# Patient Record
Sex: Male | Born: 1955 | Race: White | Hispanic: No | Marital: Single | Smoking: Never smoker
Health system: Southern US, Community
[De-identification: ages and names within clinical notes are randomized; demographics above are authoritative.]

## PROBLEM LIST (undated history)

## (undated) DIAGNOSIS — M726 Necrotizing fasciitis: Principal | ICD-10-CM

## (undated) DIAGNOSIS — K759 Inflammatory liver disease, unspecified: Secondary | ICD-10-CM

## (undated) DIAGNOSIS — N189 Chronic kidney disease, unspecified: Secondary | ICD-10-CM

## (undated) DIAGNOSIS — J189 Pneumonia, unspecified organism: Secondary | ICD-10-CM

## (undated) DIAGNOSIS — E871 Hypo-osmolality and hyponatremia: Secondary | ICD-10-CM

## (undated) DIAGNOSIS — I509 Heart failure, unspecified: Secondary | ICD-10-CM

## (undated) DIAGNOSIS — N186 End stage renal disease: Secondary | ICD-10-CM

## (undated) DIAGNOSIS — I10 Essential (primary) hypertension: Secondary | ICD-10-CM

## (undated) DIAGNOSIS — E785 Hyperlipidemia, unspecified: Secondary | ICD-10-CM

## (undated) DIAGNOSIS — E876 Hypokalemia: Secondary | ICD-10-CM

## (undated) DIAGNOSIS — E119 Type 2 diabetes mellitus without complications: Secondary | ICD-10-CM

## (undated) HISTORY — DX: Chronic kidney disease, unspecified: N18.9

## (undated) HISTORY — PX: CHOLECYSTECTOMY: SHX55

---

## 2013-09-01 ENCOUNTER — Inpatient Hospital Stay (HOSPITAL_COMMUNITY)
Admission: EM | Admit: 2013-09-01 | Discharge: 2013-09-13 | DRG: 474 | Disposition: A | Discharge status: Rehabilitation Facility | Payer: Medicaid Other | Attending: Internal Medicine | Admitting: Internal Medicine

## 2013-09-01 ENCOUNTER — Encounter (HOSPITAL_COMMUNITY): Payer: Self-pay | Admitting: Emergency Medicine

## 2013-09-01 ENCOUNTER — Emergency Department (HOSPITAL_COMMUNITY): Payer: Medicaid Other

## 2013-09-01 DIAGNOSIS — E878 Other disorders of electrolyte and fluid balance, not elsewhere classified: Secondary | ICD-10-CM

## 2013-09-01 DIAGNOSIS — E118 Type 2 diabetes mellitus with unspecified complications: Secondary | ICD-10-CM

## 2013-09-01 DIAGNOSIS — E871 Hypo-osmolality and hyponatremia: Secondary | ICD-10-CM

## 2013-09-01 DIAGNOSIS — L02619 Cutaneous abscess of unspecified foot: Secondary | ICD-10-CM | POA: Diagnosis present

## 2013-09-01 DIAGNOSIS — E1049 Type 1 diabetes mellitus with other diabetic neurological complication: Secondary | ICD-10-CM | POA: Diagnosis present

## 2013-09-01 DIAGNOSIS — E1110 Type 2 diabetes mellitus with ketoacidosis without coma: Secondary | ICD-10-CM | POA: Diagnosis present

## 2013-09-01 DIAGNOSIS — IMO0002 Reserved for concepts with insufficient information to code with codable children: Secondary | ICD-10-CM

## 2013-09-01 DIAGNOSIS — E11628 Type 2 diabetes mellitus with other skin complications: Secondary | ICD-10-CM | POA: Diagnosis present

## 2013-09-01 DIAGNOSIS — E1149 Type 2 diabetes mellitus with other diabetic neurological complication: Secondary | ICD-10-CM

## 2013-09-01 DIAGNOSIS — M659 Synovitis and tenosynovitis, unspecified: Secondary | ICD-10-CM | POA: Diagnosis present

## 2013-09-01 DIAGNOSIS — L97509 Non-pressure chronic ulcer of other part of unspecified foot with unspecified severity: Secondary | ICD-10-CM | POA: Diagnosis present

## 2013-09-01 DIAGNOSIS — E1165 Type 2 diabetes mellitus with hyperglycemia: Secondary | ICD-10-CM

## 2013-09-01 DIAGNOSIS — M65979 Unspecified synovitis and tenosynovitis, unspecified ankle and foot: Secondary | ICD-10-CM | POA: Diagnosis present

## 2013-09-01 DIAGNOSIS — L03119 Cellulitis of unspecified part of limb: Secondary | ICD-10-CM

## 2013-09-01 DIAGNOSIS — E876 Hypokalemia: Secondary | ICD-10-CM

## 2013-09-01 DIAGNOSIS — M726 Necrotizing fasciitis: Principal | ICD-10-CM | POA: Diagnosis present

## 2013-09-01 DIAGNOSIS — E119 Type 2 diabetes mellitus without complications: Secondary | ICD-10-CM | POA: Diagnosis present

## 2013-09-01 DIAGNOSIS — L089 Local infection of the skin and subcutaneous tissue, unspecified: Secondary | ICD-10-CM

## 2013-09-01 DIAGNOSIS — E8809 Other disorders of plasma-protein metabolism, not elsewhere classified: Secondary | ICD-10-CM | POA: Diagnosis present

## 2013-09-01 DIAGNOSIS — E1065 Type 1 diabetes mellitus with hyperglycemia: Secondary | ICD-10-CM | POA: Diagnosis present

## 2013-09-01 DIAGNOSIS — E1010 Type 1 diabetes mellitus with ketoacidosis without coma: Secondary | ICD-10-CM | POA: Diagnosis present

## 2013-09-01 HISTORY — DX: Pneumonia, unspecified organism: J18.9

## 2013-09-01 HISTORY — DX: Essential (primary) hypertension: I10

## 2013-09-01 HISTORY — DX: Type 2 diabetes mellitus without complications: E11.9

## 2013-09-01 HISTORY — DX: Hypokalemia: E87.6

## 2013-09-01 HISTORY — DX: Necrotizing fasciitis: M72.6

## 2013-09-01 HISTORY — DX: Inflammatory liver disease, unspecified: K75.9

## 2013-09-01 HISTORY — DX: Hypo-osmolality and hyponatremia: E87.1

## 2013-09-01 LAB — CBC
HCT: 40.9 % (ref 39.0–52.0)
Hemoglobin: 15 g/dL (ref 13.0–17.0)
MCH: 31.4 pg (ref 26.0–34.0)
MCHC: 36.7 g/dL — ABNORMAL HIGH (ref 30.0–36.0)
MCV: 85.6 fL (ref 78.0–100.0)
PLATELETS: 309 10*3/uL (ref 150–400)
RBC: 4.78 MIL/uL (ref 4.22–5.81)
RDW: 13.5 % (ref 11.5–15.5)
WBC: 26.8 10*3/uL — AB (ref 4.0–10.5)

## 2013-09-01 LAB — CG4 I-STAT (LACTIC ACID): LACTIC ACID, VENOUS: 1.43 mmol/L (ref 0.5–2.2)

## 2013-09-01 LAB — BASIC METABOLIC PANEL
BUN: 28 mg/dL — ABNORMAL HIGH (ref 6–23)
CALCIUM: 9.5 mg/dL (ref 8.4–10.5)
CO2: 9 mEq/L — CL (ref 19–32)
CREATININE: 0.89 mg/dL (ref 0.50–1.35)
Chloride: 95 mEq/L — ABNORMAL LOW (ref 96–112)
GFR calc Af Amer: >90 mL/min (ref >90)
GFR calc non Af Amer: >90 mL/min (ref >90)
GLUCOSE: 594 mg/dL — AB (ref 70–99)
Potassium: 3.8 mEq/L (ref 3.7–5.3)
Sodium: 133 mEq/L — ABNORMAL LOW (ref 137–147)

## 2013-09-01 MED ORDER — DEXTROSE 50 % IV SOLN: 25.0000 mL | INTRAVENOUS | Status: DC | PRN

## 2013-09-01 MED ORDER — DEXTROSE-NACL 5-0.45 % IV SOLN: INTRAVENOUS | Status: DC

## 2013-09-01 MED ORDER — VANCOMYCIN HCL IN DEXTROSE 1-5 GM/200ML-% IV SOLN
1000.0000 mg | Freq: Once | INTRAVENOUS | Status: AC
Start: 2013-09-01 — End: 2013-09-02
  Administered 2013-09-01: 1000 mg via INTRAVENOUS
  Filled 2013-09-01: qty 200

## 2013-09-01 MED ORDER — CEFAZOLIN SODIUM 1-5 GM-% IV SOLN
1.0000 g | Freq: Once | INTRAVENOUS | Status: AC
  Administered 2013-09-01: 1 g via INTRAVENOUS
  Filled 2013-09-01: qty 50

## 2013-09-01 MED ORDER — SODIUM CHLORIDE 0.9 % IV SOLN: INTRAVENOUS | Status: DC

## 2013-09-01 MED ORDER — SODIUM CHLORIDE 0.9 % IV BOLUS (SEPSIS): 1000.0000 mL | Freq: Once | INTRAVENOUS | Status: DC

## 2013-09-01 MED ORDER — INSULIN REGULAR BOLUS VIA INFUSION
0.0000 [IU] | Freq: Three times a day (TID) | INTRAVENOUS | Status: DC
  Administered 2013-09-03: 2.5 [IU] via INTRAVENOUS
  Administered 2013-09-04: 4.5 [IU] via INTRAVENOUS
  Administered 2013-09-04: 5 [IU] via INTRAVENOUS
  Administered 2013-09-04: 4.5 [IU] via INTRAVENOUS
  Administered 2013-09-05: 5.1 [IU] via INTRAVENOUS
  Administered 2013-09-05: 5.4 [IU] via INTRAVENOUS
  Administered 2013-09-05: 5.6 [IU] via INTRAVENOUS
  Filled 2013-09-01: qty 10

## 2013-09-01 MED ORDER — INSULIN REGULAR HUMAN 100 UNIT/ML IJ SOLN
INTRAMUSCULAR | Status: DC
  Administered 2013-09-02: 4.6 [IU]/h via INTRAVENOUS
  Administered 2013-09-02: 10.1 [IU]/h via INTRAVENOUS
  Administered 2013-09-02: 7.1 [IU]/h via INTRAVENOUS
  Administered 2013-09-03: 10.5 [IU]/h via INTRAVENOUS
  Administered 2013-09-03: 11.7 [IU]/h via INTRAVENOUS
  Administered 2013-09-03: 13.4 [IU]/h via INTRAVENOUS
  Administered 2013-09-03: 21:00:00 via INTRAVENOUS
  Administered 2013-09-03: 11.7 [IU]/h via INTRAVENOUS
  Administered 2013-09-04: 9.1 [IU]/h via INTRAVENOUS
  Administered 2013-09-04: 6.8 [IU]/h via INTRAVENOUS
  Administered 2013-09-04: 11.1 [IU]/h via INTRAVENOUS
  Administered 2013-09-04: 7.4 [IU]/h via INTRAVENOUS
  Administered 2013-09-04: 10 [IU]/h via INTRAVENOUS
  Administered 2013-09-05: 10.1 [IU]/h via INTRAVENOUS
  Administered 2013-09-05: 15:00:00 via INTRAVENOUS
  Filled 2013-09-01 (×9): qty 1

## 2013-09-01 MED ORDER — PIPERACILLIN-TAZOBACTAM 3.375 G IVPB 30 MIN
3.3750 g | Freq: Once | INTRAVENOUS | Status: AC
  Administered 2013-09-01: 3.375 g via INTRAVENOUS
  Filled 2013-09-01: qty 50

## 2013-09-01 NOTE — Progress Notes (Signed)
ANTIBIOTIC CONSULT NOTE - INITIAL  Pharmacy Consult:  Ancef Indication:  Bilateral feet infection  No Known Allergies  Patient Measurements:    Vital Signs: Temp: 97.7 F (36.5 C) (02/01 1829) Temp src: Oral (02/01 1829) BP: 137/90 mmHg (02/01 1945) Pulse Rate: 105 (02/01 1945)  Labs: No results found for this basename: WBC, HGB, PLT, LABCREA, CREATININE,  in the last 72 hours CrCl is unknown because no creatinine reading has been taken and the patient has no height on file. No results found for this basename: VANCOTROUGH, VANCOPEAK, VANCORANDOM, GENTTROUGH, GENTPEAK, GENTRANDOM, TOBRATROUGH, TOBRAPEAK, TOBRARND, AMIKACINPEAK, AMIKACINTROU, AMIKACIN,  in the last 72 hours   Microbiology: No results found for this or any previous visit (from the past 720 hour(s)).  Medical History: Past Medical History  Diagnosis Date  . Hepatitis        Assessment: 30 YOM with history of frostbite last year presented with complaints of intermittent numbness and paresthesias of bilateral feet.  Pharmacy consulted to manage Ancef.  BMET was pending and now canceled (?).   Goal of Therapy:  Clearance of infection   Plan:  - Ancef 1gm IV x 1 - F/U renal fxn and order maintenance regimen as appropriate - F/U height and weight (RN aware)    Oaklyn Mans D. Mina Marble, PharmD, BCPS Pager:  [phone] 09/01/2013, 9:58 PM

## 2013-09-01 NOTE — ED Notes (Signed)
The pts family has been at the beside .  The sister is in the waiting room and does not wish to stay at the bedside due to the odor.  The pts lt great toes is partially missing from a childhood accident.  Water given to the pt

## 2013-09-01 NOTE — ED Notes (Signed)
Zosyn started

## 2013-09-01 NOTE — Consult Note (Signed)
ORTHOPAEDIC CONSULTATION  REQUESTING PHYSICIAN: Orlie Dakin, MD  Chief Complaint: Left foot infection r/o Delma Post fasc  HPI: [name] is a 58 y.o. male who complains of left foot pain, swelling, drainage since Monday.  Patient lives in his car.  Does not have regular MD care.  Comes in tonight because his sisters demanded that he be seen.  Denies fevers.  Does not take care of himself very well.  Denies DM although patient does not have a regular MD.  Past Medical History  Diagnosis Date  . Hepatitis    History reviewed. No pertinent past surgical history. History   Social History  . Marital Status: Single    Spouse Name: N/A    Number of Children: N/A  . Years of Education: N/A   Social History Main Topics  . Smoking status: Never Smoker   . Smokeless tobacco: None  . Alcohol Use: No  . Drug Use: No  . Sexual Activity: None   Other Topics Concern  . None   Social History Narrative  . None   History reviewed. No pertinent family history. No Known Allergies Prior to Admission medications   Not on File   Dg Foot Complete Left  09/01/2013   CLINICAL DATA:  Skin ulcerations both feet.  EXAM: LEFT FOOT - COMPLETE 3+ VIEW  COMPARISON:  None.  FINDINGS: Extensive gas is seen tracking in dorsal and plantar soft tissues of the left foot. Although diffuse, the largest collection of gas is seen about the third, fourth and fifth metatarsals. Soft tissues of the foot are swollen. No radiopaque foreign body is identified. No bony destructive change is seen. 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 Well corticated defect in the head of the middle phalanx of the second toe is identified and may be postoperative.  IMPRESSION: Extensive soft tissue gas in the right foot is consistent with cellulitis and abscess formation. Necrotizing fasciitis can cause this appearance. No plain film evidence of osteomyelitis is identified.    Electronically Signed   By: Inge Rise M.D.   On: 09/01/2013 21:20   Dg Foot Complete Right  09/01/2013   ADDENDUM REPORT: 09/01/2013 21:57  ADDENDUM: This addendum is given for the purpose of noting that refreence to the right foot in the impression is incorrect. The report is for the left foot.   Electronically Signed   By: Inge Rise M.D.   On: 09/01/2013 21:57   09/01/2013   CLINICAL DATA:  Ulcers on both feet.  EXAM: RIGHT FOOT COMPLETE - 3+ VIEW  COMPARISON:  None.  FINDINGS: No bony destructive change, radiopaque foreign body or soft tissue gas collection is identified. There appear to be skin ulcerations in the lateral margin of the foot. There is no fracture or dislocation. Prominent calcaneal spurs are noted.  IMPRESSION: Skin ulcerations without acute bony or joint abnormality.  Electronically Signed: By: Inge Rise M.D. On: 09/01/2013 21:15    Positive ROS: All other systems have been reviewed and were otherwise negative with the exception of those mentioned in the HPI and as above.  Physical Exam: General: Alert, no acute distress Cardiovascular: No pedal edema Respiratory: No cyanosis, no use of accessory musculature GI: No organomegaly, abdomen is soft and non-tender Skin: celllulitis to level of left ankle Neurologic: Sensation intact distally Psychiatric: Patient is competent for consent with normal mood and affect Lymphatic: No axillary or cervical lymphadenopathy  MUSCULOSKELETAL:  Left foot - cellulitis to level of ankle - nonpalpable pulses - purulent drainage from plantar, lateral foot - crepitus felt in dorsum of foot - foul smelling odor - toes wwp  Right foot - dry skin - palpable pulses - benign exam  Assessment: Left foot infection r/o nec fasc  Plan: - xrays reviewed and gas on xray may be c/w nec fasc although clinical course does not completely fit and other infections can cause gas also - recommend vanc and zosyn emprically -  recommend left foot MRI asap, with contrast ideally - NPO after midnight - patient will need I&D tomorrow but MRI will allow for better surgical planning - if patient begins to decline clinically tonight, please call [phone] for re-eval as patient may need emergent I&D - will follow  Thank you for the consult and the opportunity to see [name] Eduard Roux, MD [facility] [phone] 11:12 PM

## 2013-09-01 NOTE — ED Notes (Signed)
Pt is very vague. He states hes had intermittent numbness and pain in his feet since he got frostbite last year. He also states that last week he lost control of his bladder. Is unable to state specifically why he is here today.

## 2013-09-01 NOTE — ED Provider Notes (Signed)
CSN: [id number]     Arrival date & time 09/01/13  1817 History   First MD Initiated Contact with Patient 09/01/13 1921     Chief Complaint  Patient presents with  . Foot Pain   (Consider location/radiation/quality/duration/timing/severity/associated sxs/prior Treatment) The history is provided by the patient, a relative and medical records.   This is a 58 year old male with past medical history significant for hypertension and hepatitis, presenting to the ED for intermittent numbness and paresthesias of bilateral feet after an episode of frostbite last year.  Patient was brought in by family who provide most of the history. Sister and sister-in-law state that patient has been living in his car for the past 3 years following the death of his mother. They say they both offered him the opportunity to live with them, but he declines.  Additionally they note, he has been in a steady decline of health mentally and physically after his divorce from his wife. Pt denies fevers, sweats, or chills.  States he felt like he might have been getting a "cold" starting Monday but feels ok today.  VS stable on arrival.  Past Medical History  Diagnosis Date  . Hepatitis    History reviewed. No pertinent past surgical history. History reviewed. No pertinent family history. History  Substance Use Topics  . Smoking status: Never Smoker   . Smokeless tobacco: Not on file  . Alcohol Use: No    Review of Systems  Skin: Positive for wound.  All other systems reviewed and are negative.    Allergies  Review of patient's allergies indicates no known allergies.  Home Medications  No current outpatient prescriptions on file. BP 137/90  Pulse 105  Temp(Src) 97.7 F (36.5 C) (Oral)  Resp 18  SpO2 100%  Physical Exam  Nursing note and vitals reviewed. Constitutional: He is oriented to person, place, and time. He appears well-developed and well-nourished. No distress.  Disheveled appearance, clothes soiled  and malodorous  HENT:  Head: Normocephalic and atraumatic.  Mouth/Throat: Oropharynx is clear and moist.  Eyes: Conjunctivae and EOM are normal. Pupils are equal, round, and reactive to light.  Neck: Normal range of motion. Neck supple.  Cardiovascular: Normal rate, regular rhythm and normal heart sounds.   Pulmonary/Chest: Effort normal and breath sounds normal.  Abdominal: Soft. Bowel sounds are normal.  Musculoskeletal: Normal range of motion.  Soles of bilateral feet macerated; multiple ulcers present with active purulent, malodorous drainage; freely moving both feet and all toes; decreased sensation to distal toes Right foot very cold to touch without palpable pulse but detectable by doppler, slow cap refill Left foot warmer with palpable DP pulse  Neurological: He is alert and oriented to person, place, and time.  Skin: Skin is warm and dry. He is not diaphoretic.  Psychiatric: He has a normal mood and affect.           ED Course  Procedures (including critical care time) Labs Review Labs Reviewed  CBC - Abnormal; Notable for the following:    WBC 26.8 (*)    MCHC 36.7 (*)    All other components within normal limits  BASIC METABOLIC PANEL - Abnormal; Notable for the following:    Sodium 133 (*)    Chloride 95 (*)    CO2 9 (*)    Glucose, Bld 594 (*)    BUN 28 (*)    All other components within normal limits  WOUND CULTURE  URINALYSIS, ROUTINE W REFLEX MICROSCOPIC  URINE RAPID DRUG  SCREEN (HOSP PERFORMED)  BLOOD GAS, VENOUS  CG4 I-STAT (LACTIC ACID)   Imaging Review Dg Foot Complete Left  09/01/2013   CLINICAL DATA:  Skin ulcerations both feet.  EXAM: LEFT FOOT - COMPLETE 3+ VIEW  COMPARISON:  None.  FINDINGS: Extensive gas is seen tracking in dorsal and plantar soft tissues of the left foot. Although diffuse, the largest collection of gas is seen about the third, fourth and fifth metatarsals. Soft tissues of the foot are swollen. No radiopaque foreign body is  identified. No bony destructive change is seen. 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 Well corticated defect in the head of the middle phalanx of the second toe is identified and may be postoperative.  IMPRESSION: Extensive soft tissue gas in the right foot is consistent with cellulitis and abscess formation. Necrotizing fasciitis can cause this appearance. No plain film evidence of osteomyelitis is identified.   Electronically Signed   By: Inge Rise M.D.   On: 09/01/2013 21:20   Dg Foot Complete Right  09/01/2013   CLINICAL DATA:  Ulcers on both feet.  EXAM: RIGHT FOOT COMPLETE - 3+ VIEW  COMPARISON:  None.  FINDINGS: No bony destructive change, radiopaque foreign body or soft tissue gas collection is identified. There appear to be skin ulcerations in the lateral margin of the foot. There is no fracture or dislocation. Prominent calcaneal spurs are noted.  IMPRESSION: Skin ulcerations without acute bony or joint abnormality.   Electronically Signed   By: Inge Rise M.D.   On: 09/01/2013 21:15    EKG Interpretation   None       MDM   1. Left foot infection   2. Right foot infection   3. DKA (diabetic ketoacidoses)   4. Hyponatremia   5. Hypochloremia    Pt makes no mention of loss of bladder control during evaluation, however this was mentioned by triage nurse.  Upon entering pts room, there is a strong, foul odor present. Patient socks and shoes were removed, both soiled with  malodorous, purulent drainage. Soles of bilateral feet are macerated with multiple ulcerations and drainage. The right foot is cold compared with left and has no palpable pulse-- pulse confirmed with doppler. Left foot does have a palpable dorsalis pedis pulse.  Wound culture was obtained from largest ulcer of lateral left foot.  Basic labs obtained.  Ancef started.  Plain films obtained-- original report of left foot indicated gas in right foot.   I have spoken with radiologist (Dr. Pollyann Kennedy), he confirms gas was present in the left foot, not the right foot and he has amended his read.  Consulted orthopedics, Dr. Erlinda Hong, who has evaluated pt in the ED-- recommends vanc/zosyn, NPO after midnight, MRI w/contrast of left foot in morning and he will take to OR tomorrow for debridment.  Asked that if pts condition changes overnight or begins to appear toxic that he be contacted.  Pts blood sugar elevated at 594, anion gap at 29-- clinically DKA.  Will obtain venous blood gas and start on glucose stabilizer.  Discussed with hospitalist, Dr. Marvis Repress who will admit.  Larene Pickett, PA-C 09/02/13 [phone]

## 2013-09-01 NOTE — ED Provider Notes (Addendum)
Complains of bilateral numbness in his feet. On exam patient is poorly kempt. Bilateral feet with peeling skin, necrotic skin. Right foot DP pulse 2+ PT pulse 1+ left foot DP pulse obtainable by Doppler PT pulse 1+  Orlie Dakin, MD 09/01/13 2147  Orlie Dakin, MD 09/02/13 [id number]

## 2013-09-01 NOTE — ED Provider Notes (Addendum)
X-rays viewed by me. Doubt necrotizing fasciitis clinically in that has had an indolent course. However we've consult to Dr.Xu who will evaluate patient to determine if surgical debridement is required.   Orlie Dakin, MD 09/01/13 2242 Case discussed with Dr. Erlinda Hong plan admit to medical service. He requests MRI scan to be obtained in the morning. No emergent surgery needed. We'll treat with intravenous antibiotics presently.  Orlie Dakin, MD 09/02/13 [id number]

## 2013-09-01 NOTE — ED Notes (Signed)
Pt. Bilateral feet macerated, skin peeling, and flaking, multiple ulcers, areas of redness and yellow discharge, very malodorus. Pt feet cold to touch, cleaned with soap and water, then cleaned with peroxide and dried and covered in warm blankets, pt has urine and feces in pants. Pt cleaned and placed in gown. Pt alert & oriented, in NAD

## 2013-09-01 NOTE — ED Notes (Signed)
LS, EDPA notified of critical lab results.

## 2013-09-01 NOTE — ED Notes (Signed)
orthopedic specialist at bedside

## 2013-09-02 ENCOUNTER — Encounter (HOSPITAL_COMMUNITY): Payer: Self-pay | Admitting: Internal Medicine

## 2013-09-02 ENCOUNTER — Inpatient Hospital Stay (HOSPITAL_COMMUNITY): Payer: Medicaid Other

## 2013-09-02 DIAGNOSIS — E118 Type 2 diabetes mellitus with unspecified complications: Secondary | ICD-10-CM

## 2013-09-02 DIAGNOSIS — L02619 Cutaneous abscess of unspecified foot: Secondary | ICD-10-CM

## 2013-09-02 DIAGNOSIS — E1110 Type 2 diabetes mellitus with ketoacidosis without coma: Secondary | ICD-10-CM

## 2013-09-02 DIAGNOSIS — E1149 Type 2 diabetes mellitus with other diabetic neurological complication: Secondary | ICD-10-CM

## 2013-09-02 DIAGNOSIS — IMO0002 Reserved for concepts with insufficient information to code with codable children: Secondary | ICD-10-CM

## 2013-09-02 DIAGNOSIS — E11628 Type 2 diabetes mellitus with other skin complications: Secondary | ICD-10-CM | POA: Diagnosis present

## 2013-09-02 DIAGNOSIS — M726 Necrotizing fasciitis: Principal | ICD-10-CM

## 2013-09-02 DIAGNOSIS — E119 Type 2 diabetes mellitus without complications: Secondary | ICD-10-CM | POA: Diagnosis present

## 2013-09-02 DIAGNOSIS — E1165 Type 2 diabetes mellitus with hyperglycemia: Secondary | ICD-10-CM

## 2013-09-02 DIAGNOSIS — L03119 Cellulitis of unspecified part of limb: Secondary | ICD-10-CM

## 2013-09-02 DIAGNOSIS — L089 Local infection of the skin and subcutaneous tissue, unspecified: Secondary | ICD-10-CM

## 2013-09-02 LAB — HEPATITIS PANEL, ACUTE
HCV Ab: NEGATIVE
Hep A IgM: NONREACTIVE
Hep B C IgM: NONREACTIVE
Hepatitis B Surface Ag: NEGATIVE

## 2013-09-02 LAB — BASIC METABOLIC PANEL
BUN: 23 mg/dL (ref 6–23)
BUN: 20 mg/dL (ref 6–23)
BUN: 18 mg/dL (ref 6–23)
BUN: 18 mg/dL (ref 6–23)
CHLORIDE: 106 meq/L (ref 96–112)
CHLORIDE: 105 meq/L (ref 96–112)
CO2: 14 meq/L — AB (ref 19–32)
CO2: 16 meq/L — AB (ref 19–32)
CO2: 15 mEq/L — ABNORMAL LOW (ref 19–32)
CO2: 14 meq/L — AB (ref 19–32)
CREATININE: 0.79 mg/dL (ref 0.50–1.35)
Calcium: 8.8 mg/dL (ref 8.4–10.5)
Calcium: 8.7 mg/dL (ref 8.4–10.5)
Calcium: 8.5 mg/dL (ref 8.4–10.5)
Calcium: 8.5 mg/dL (ref 8.4–10.5)
Chloride: 105 mEq/L (ref 96–112)
Chloride: 106 mEq/L (ref 96–112)
Creatinine, Ser: 0.84 mg/dL (ref 0.50–1.35)
Creatinine, Ser: 0.71 mg/dL (ref 0.50–1.35)
Creatinine, Ser: 0.79 mg/dL (ref 0.50–1.35)
GFR calc Af Amer: >90 mL/min (ref >90)
GFR calc Af Amer: >90 mL/min (ref >90)
GFR calc Af Amer: >90 mL/min (ref >90)
GFR calc non Af Amer: >90 mL/min (ref >90)
GFR calc non Af Amer: >90 mL/min (ref >90)
GFR calc non Af Amer: >90 mL/min (ref >90)
GLUCOSE: 125 mg/dL — AB (ref 70–99)
Glucose, Bld: 176 mg/dL — ABNORMAL HIGH (ref 70–99)
Glucose, Bld: 171 mg/dL — ABNORMAL HIGH (ref 70–99)
Glucose, Bld: 130 mg/dL — ABNORMAL HIGH (ref 70–99)
POTASSIUM: 2.9 meq/L — AB (ref 3.7–5.3)
POTASSIUM: 3.5 meq/L — AB (ref 3.7–5.3)
POTASSIUM: 3.3 meq/L — AB (ref 3.7–5.3)
Potassium: 2.9 mEq/L — CL (ref 3.7–5.3)
SODIUM: 139 meq/L (ref 137–147)
Sodium: 140 mEq/L (ref 137–147)
Sodium: 138 mEq/L (ref 137–147)
Sodium: 139 mEq/L (ref 137–147)

## 2013-09-02 LAB — COMPREHENSIVE METABOLIC PANEL
ALT: 9 U/L (ref 0–53)
AST: 12 U/L (ref 0–37)
Albumin: 2.4 g/dL — ABNORMAL LOW (ref 3.5–5.2)
Alkaline Phosphatase: 148 U/L — ABNORMAL HIGH (ref 39–117)
BUN: 25 mg/dL — ABNORMAL HIGH (ref 6–23)
CHLORIDE: 99 meq/L (ref 96–112)
CO2: 13 mEq/L — ABNORMAL LOW (ref 19–32)
Calcium: 9 mg/dL (ref 8.4–10.5)
Creatinine, Ser: 0.83 mg/dL (ref 0.50–1.35)
GFR calc Af Amer: >90 mL/min (ref >90)
GFR calc non Af Amer: >90 mL/min (ref >90)
Glucose, Bld: 309 mg/dL — ABNORMAL HIGH (ref 70–99)
POTASSIUM: 3 meq/L — AB (ref 3.7–5.3)
SODIUM: 134 meq/L — AB (ref 137–147)
TOTAL PROTEIN: 7.9 g/dL (ref 6.0–8.3)
Total Bilirubin: 0.3 mg/dL (ref 0.3–1.2)

## 2013-09-02 LAB — GLUCOSE, CAPILLARY
GLUCOSE-CAPILLARY: 312 mg/dL — AB (ref 70–99)
GLUCOSE-CAPILLARY: 299 mg/dL — AB (ref 70–99)
GLUCOSE-CAPILLARY: 173 mg/dL — AB (ref 70–99)
GLUCOSE-CAPILLARY: 162 mg/dL — AB (ref 70–99)
GLUCOSE-CAPILLARY: 155 mg/dL — AB (ref 70–99)
GLUCOSE-CAPILLARY: 186 mg/dL — AB (ref 70–99)
GLUCOSE-CAPILLARY: 124 mg/dL — AB (ref 70–99)
GLUCOSE-CAPILLARY: 130 mg/dL — AB (ref 70–99)
Glucose-Capillary: 133 mg/dL — ABNORMAL HIGH (ref 70–99)
Glucose-Capillary: 137 mg/dL — ABNORMAL HIGH (ref 70–99)
Glucose-Capillary: 142 mg/dL — ABNORMAL HIGH (ref 70–99)
Glucose-Capillary: 163 mg/dL — ABNORMAL HIGH (ref 70–99)
Glucose-Capillary: 183 mg/dL — ABNORMAL HIGH (ref 70–99)
Glucose-Capillary: 189 mg/dL — ABNORMAL HIGH (ref 70–99)
Glucose-Capillary: 189 mg/dL — ABNORMAL HIGH (ref 70–99)
Glucose-Capillary: 193 mg/dL — ABNORMAL HIGH (ref 70–99)
Glucose-Capillary: 200 mg/dL — ABNORMAL HIGH (ref 70–99)
Glucose-Capillary: 340 mg/dL — ABNORMAL HIGH (ref 70–99)
Glucose-Capillary: 367 mg/dL — ABNORMAL HIGH (ref 70–99)
Glucose-Capillary: 431 mg/dL — ABNORMAL HIGH (ref 70–99)
Glucose-Capillary: 516 mg/dL — ABNORMAL HIGH (ref 70–99)

## 2013-09-02 LAB — URINALYSIS, ROUTINE W REFLEX MICROSCOPIC
Bilirubin Urine: NEGATIVE
Glucose, UA: >1000 mg/dL — AB
Ketones, ur: >80 mg/dL — AB
LEUKOCYTES UA: NEGATIVE
Nitrite: NEGATIVE
PH: 6 (ref 5.0–8.0)
Protein, ur: 100 mg/dL — AB
Specific Gravity, Urine: 1.035 — ABNORMAL HIGH (ref 1.005–1.030)
UROBILINOGEN UA: 0.2 mg/dL (ref 0.0–1.0)

## 2013-09-02 LAB — HEMOGLOBIN A1C
HEMOGLOBIN A1C: 12.2 % — AB (ref <5.7)
Mean Plasma Glucose: 303 mg/dL — ABNORMAL HIGH (ref <117)

## 2013-09-02 LAB — RAPID URINE DRUG SCREEN, HOSP PERFORMED
Amphetamines: NOT DETECTED
Barbiturates: NOT DETECTED
Benzodiazepines: NOT DETECTED
Cocaine: NOT DETECTED
Opiates: NOT DETECTED
Tetrahydrocannabinol: NOT DETECTED

## 2013-09-02 LAB — TSH: TSH: 1.353 u[IU]/mL (ref 0.350–4.500)

## 2013-09-02 LAB — PHOSPHORUS
PHOSPHORUS: 1.3 mg/dL — AB (ref 2.3–4.6)
PHOSPHORUS: 1.9 mg/dL — AB (ref 2.3–4.6)
PHOSPHORUS: 2.3 mg/dL (ref 2.3–4.6)
Phosphorus: 0.6 mg/dL — CL (ref 2.3–4.6)
Phosphorus: 0.9 mg/dL — CL (ref 2.3–4.6)

## 2013-09-02 LAB — CBC
HEMATOCRIT: 33.4 % — AB (ref 39.0–52.0)
Hemoglobin: 12.3 g/dL — ABNORMAL LOW (ref 13.0–17.0)
MCH: 30.6 pg (ref 26.0–34.0)
MCHC: 36.8 g/dL — ABNORMAL HIGH (ref 30.0–36.0)
MCV: 83.1 fL (ref 78.0–100.0)
Platelets: 256 10*3/uL (ref 150–400)
RBC: 4.02 MIL/uL — ABNORMAL LOW (ref 4.22–5.81)
RDW: 13.3 % (ref 11.5–15.5)
WBC: 23.3 10*3/uL — ABNORMAL HIGH (ref 4.0–10.5)

## 2013-09-02 LAB — URINE MICROSCOPIC-ADD ON

## 2013-09-02 LAB — MAGNESIUM: MAGNESIUM: 2.5 mg/dL (ref 1.5–2.5)

## 2013-09-02 MED ORDER — POTASSIUM PHOSPHATE DIBASIC 3 MMOLE/ML IV SOLN
30.0000 mmol | Freq: Once | INTRAVENOUS | Status: AC
  Administered 2013-09-02: 30 mmol via INTRAVENOUS
  Filled 2013-09-02: qty 10

## 2013-09-02 MED ORDER — POTASSIUM CHLORIDE CRYS ER 20 MEQ PO TBCR
40.0000 meq | EXTENDED_RELEASE_TABLET | Freq: Once | ORAL | Status: AC
  Administered 2013-09-02: 40 meq via ORAL
  Filled 2013-09-02: qty 2

## 2013-09-02 MED ORDER — POTASSIUM PHOSPHATE DIBASIC 3 MMOLE/ML IV SOLN
30.0000 mmol | Freq: Once | INTRAVENOUS | Status: DC
  Filled 2013-09-02: qty 10

## 2013-09-02 MED ORDER — SODIUM CHLORIDE 0.9 % IV SOLN
INTRAVENOUS | Status: DC
  Administered 2013-09-02: 04:00:00 via INTRAVENOUS

## 2013-09-02 MED ORDER — LIVING WELL WITH DIABETES BOOK
Freq: Once | Status: AC
  Administered 2013-09-02: 12:00:00
  Filled 2013-09-02: qty 1

## 2013-09-02 MED ORDER — POTASSIUM CHLORIDE 2 MEQ/ML IV SOLN
INTRAVENOUS | Status: DC
  Administered 2013-09-02 – 2013-09-04 (×4): via INTRAVENOUS
  Filled 2013-09-02 (×14): qty 1000

## 2013-09-02 MED ORDER — PIPERACILLIN-TAZOBACTAM 3.375 G IVPB
3.3750 g | Freq: Three times a day (TID) | INTRAVENOUS | Status: DC
  Administered 2013-09-02 – 2013-09-13 (×33): 3.375 g via INTRAVENOUS
  Filled 2013-09-02 (×38): qty 50

## 2013-09-02 MED ORDER — ONDANSETRON HCL 4 MG PO TABS: 4.0000 mg | ORAL_TABLET | Freq: Four times a day (QID) | ORAL | Status: DC | PRN

## 2013-09-02 MED ORDER — MORPHINE SULFATE 2 MG/ML IJ SOLN
2.0000 mg | INTRAMUSCULAR | Status: DC | PRN
  Administered 2013-09-02 – 2013-09-04 (×7): 2 mg via INTRAVENOUS
  Filled 2013-09-02 (×7): qty 1

## 2013-09-02 MED ORDER — POTASSIUM PHOSPHATE DIBASIC 3 MMOLE/ML IV SOLN
20.0000 mmol | Freq: Once | INTRAVENOUS | Status: AC
  Administered 2013-09-02: 20 mmol via INTRAVENOUS
  Filled 2013-09-02: qty 6.67

## 2013-09-02 MED ORDER — SODIUM CHLORIDE 0.9 % IJ SOLN
3.0000 mL | Freq: Two times a day (BID) | INTRAMUSCULAR | Status: DC
  Administered 2013-09-02 – 2013-09-12 (×2): 3 mL via INTRAVENOUS

## 2013-09-02 MED ORDER — POTASSIUM CHLORIDE 10 MEQ/100ML IV SOLN
10.0000 meq | INTRAVENOUS | Status: DC
  Filled 2013-09-02 (×3): qty 100

## 2013-09-02 MED ORDER — PIPERACILLIN-TAZOBACTAM 3.375 G IVPB 30 MIN: 3.3750 g | Freq: Three times a day (TID) | INTRAVENOUS | Status: DC

## 2013-09-02 MED ORDER — ONDANSETRON HCL 4 MG/2ML IJ SOLN
4.0000 mg | Freq: Four times a day (QID) | INTRAMUSCULAR | Status: DC | PRN
Start: 2013-09-02 — End: 2013-09-13

## 2013-09-02 MED ORDER — VANCOMYCIN HCL IN DEXTROSE 1-5 GM/200ML-% IV SOLN
1000.0000 mg | Freq: Two times a day (BID) | INTRAVENOUS | Status: DC
  Administered 2013-09-02 – 2013-09-13 (×21): 1000 mg via INTRAVENOUS
  Filled 2013-09-02 (×26): qty 200

## 2013-09-02 MED ORDER — CLINDAMYCIN PHOSPHATE 600 MG/50ML IV SOLN
600.0000 mg | Freq: Three times a day (TID) | INTRAVENOUS | Status: DC
  Administered 2013-09-02 – 2013-09-13 (×33): 600 mg via INTRAVENOUS
  Filled 2013-09-02 (×37): qty 50

## 2013-09-02 NOTE — ED Notes (Signed)
Insulin drip has just arrived .  Waiting for the vancomycin to infuse

## 2013-09-02 NOTE — Progress Notes (Signed)
ANTIBIOTIC CONSULT NOTE - INITIAL  Pharmacy Consult for vancomycin Indication: cellulitis  No Known Allergies  Patient Measurements: Height: 5\' 3"  (160 cm) Weight: 144 lb (65.318 kg) IBW/kg (Calculated) : 56.9  Vital Signs: Temp: 98.1 F (36.7 C) (02/01 2357) Temp src: Oral (02/01 1829) BP: 125/71 mmHg (02/01 2357) Pulse Rate: 100 (02/01 2357)  Labs:  Recent Labs  09/01/13 2015 09/01/13 2200  WBC 26.8*  --   HGB 15.0  --   PLT 309  --   CREATININE  --  0.89   Estimated Creatinine Clearance: 75.5 ml/min (by C-G formula based on Cr of 0.89).   Microbiology: No results found for this or any previous visit (from the past 720 hour(s)).  Medical History: Past Medical History  Diagnosis Date  . Hepatitis     Assessment: 58yo male c/o pain, swelling, and drainage of left foot since Monday, ortho saw pt in ED, report Xray may be c/w necrotizing fasciitis though clinical course suggests otherwise, to begin IV ABX empirically.  Goal of Therapy:  Vancomycin trough level 15-20 mcg/ml (will decrease to 10-15 if infxn limited to cellulitis)  Plan:  Rec'd vanc 1g in ED; will continue with vancomycin 750mg  IV Q12H and monitor CBC, Cx, levels prn.  Wynona Neat, PharmD, BCPS  09/02/2013,3:21 AM

## 2013-09-02 NOTE — ED Notes (Signed)
Report called to paula rn on 3w

## 2013-09-02 NOTE — Progress Notes (Signed)
PATIENT DETAILS Name: [name] Age: 58 y.o. Sex: male Date of Birth: [birth year] Admit Date: 09/01/2013 Admitting Physician Toy Baker, MD PCP:No PCP Per Patient  Subjective: Continues to have pain in the left lower extremity. Foul smell throughout the room.  Assessment/Plan: Suspected necrotizing infection of the left foot - Patient presented with a foul-smelling ulceration of the left foot, x-ray on admission showed gas in the soft tissue. Patient had significant leukocytosis. Patient was subsequently admitted, orthopedics was consulted. - Currently on vancomycin, Zosyn and clindamycin-day 1 - Current plans are for MRI left foot to determine extent of debridement needed.  Diabetic ketoacidosis - Currently on an insulin infusion, but steadily improved. We will transition to subcutaneous insulin once anion gap has closed. - New diagnosis of diabetes this admission, check A1c  Hypokalemia - Continue to replace potassium. - Be met every 4 hours for now  Hypophosphatemia - Secondary to poor nutritional status, insulin infusion. - Continue to supplement potassium, watch for refeeding syndrome  Disposition: Remain inpatient  DVT Prophylaxis:  SCD's  Code Status: Full code   Family Communication None at bedside  Procedures:  None  CONSULTS:  orthopedic surgery  Time spent 40 minutes-which includes 50% of the time with face-to-face with patient/ family and coordinating care related to the above assessment and plan.    MEDICATIONS: Scheduled Meds: . clindamycin (CLEOCIN) IV  600 mg Intravenous Q8H  . insulin regular  0-10 Units Intravenous TID WC  . living well with diabetes book   Does not apply Once  . piperacillin-tazobactam (ZOSYN)  IV  3.375 g Intravenous Q8H  . potassium chloride  40 mEq Oral Once  . potassium phosphate IVPB (mmol)  20 mmol Intravenous Once  . sodium chloride  3 mL Intravenous Q12H  . vancomycin  1,000 mg Intravenous  Q12H   Continuous Infusions: . sodium chloride    . sodium chloride 100 mL/hr at 09/02/13 0404  . insulin (NOVOLIN-R) infusion 9.3 Units/hr (09/02/13 1114)   PRN Meds:.dextrose, morphine injection, ondansetron (ZOFRAN) IV, ondansetron  Antibiotics: Anti-infectives   Start     Dose/Rate Route Frequency Ordered Stop   09/02/13 1100  vancomycin (VANCOCIN) IVPB 1000 mg/200 mL premix     1,000 mg 200 mL/hr over 60 Minutes Intravenous Every 12 hours 09/02/13 0938     09/02/13 0800  clindamycin (CLEOCIN) IVPB 600 mg     600 mg 100 mL/hr over 30 Minutes Intravenous 3 times per day 09/02/13 0714     09/02/13 0600  piperacillin-tazobactam (ZOSYN) IVPB 3.375 g  Status:  Discontinued     3.375 g 100 mL/hr over 30 Minutes Intravenous 3 times per day 09/02/13 0351 09/02/13 0352   09/02/13 0600  piperacillin-tazobactam (ZOSYN) IVPB 3.375 g     3.375 g 12.5 mL/hr over 240 Minutes Intravenous Every 8 hours 09/02/13 0353     09/01/13 2315  vancomycin (VANCOCIN) IVPB 1000 mg/200 mL premix     1,000 mg 200 mL/hr over 60 Minutes Intravenous  Once 09/01/13 2311 09/02/13 0053   09/01/13 2315  piperacillin-tazobactam (ZOSYN) IVPB 3.375 g     3.375 g 100 mL/hr over 30 Minutes Intravenous  Once 09/01/13 2311 09/01/13 2351   09/01/13 2100  ceFAZolin (ANCEF) IVPB 1 g/50 mL premix     1 g 100 mL/hr over 30 Minutes Intravenous  Once 09/01/13 2052 09/01/13 2314      PHYSICAL EXAM: Vital signs in last 24 hours: Filed Vitals:  09/01/13 2300 09/01/13 2357 09/02/13 0349 09/02/13 0500  BP: 127/75 125/71 136/74 136/74  Pulse: 103 100 107 107  Temp:  98.1 F (36.7 C) 98.5 F (36.9 C) 98.5 F (36.9 C)  TempSrc:      Resp:  _0 Height:   _1  (1.6 m)   Weight:   67.132 kg (148 lb)   SpO2: 100% 100% 100% 100%    Weight change:  Filed Weights   09/01/13 2100 09/01/13 2209 09/02/13 0349  Weight: 65.318 kg (144 lb) 65.318 kg (144 lb) 67.132 kg (148 lb)   Body mass index is 26.22 kg/(m^2).    Gen Exam: Awake- not in any distress. Neck: Supple, No JVD.   Chest: B/L Clear.   CVS: S1 S2 Regular, no murmurs.  Abdomen: soft, BS +, non tender, non distended.  Extremities: no edema,purulent drainage from plantar, lateral foot, crepitus felt in dorsum of foot, foul smelling odor Neurologic: Non Focal.   Skin: No Rash.   Wounds: N/A.    Intake/Output from previous day:  Intake/Output Summary (Last 24 hours) at 09/02/13 1154 Last data filed at 09/02/13 0700  Gross per 24 hour  Intake  511.6 ml  Output    600 ml  Net  -88.4 ml     LAB RESULTS: CBC  Recent Labs Lab 09/01/13 2015 09/02/13 0625  WBC 26.8* 23.3*  HGB 15.0 12.3*  HCT 40.9 33.4*  PLT 309 256  MCV 85.6 83.1  MCH 31.4 30.6  MCHC 36.7* 36.8*  RDW 13.5 13.3    Chemistries   Recent Labs Lab 09/01/13 2200 09/02/13 0625 09/02/13 1010  NA 133* 134* 140  K 3.8 3.0* 2.9*  CL 95* 99 106  CO2 9* 13* 14*  GLUCOSE 594* 309* 176*  BUN 28* 25* 23  CREATININE 0.89 0.83 0.79  CALCIUM 9.5 9.0 8.8  MG  --  2.5  --     CBG:  Recent Labs Lab 09/02/13 0654 09/02/13 0801 09/02/13 0903 09/02/13 1003 09/02/13 1107  GLUCAP 299* 200* 173* 183* 193*    GFR Estimated Creatinine Clearance: 84 ml/min (by C-G formula based on Cr of 0.79).  Coagulation profile No results found for this basename: INR, PROTIME,  in the last 168 hours  Cardiac Enzymes No results found for this basename: CK, CKMB, TROPONINI, MYOGLOBIN,  in the last 168 hours  No components found with this basename: POCBNP,  No results found for this basename: DDIMER,  in the last 72 hours No results found for this basename: HGBA1C,  in the last 72 hours No results found for this basename: CHOL, HDL, LDLCALC, TRIG, CHOLHDL, LDLDIRECT,  in the last 72 hours No results found for this basename: TSH, T4TOTAL, FREET3, T3FREE, THYROIDAB,  in the last 72 hours No results found for this basename: VITAMINB12, FOLATE, FERRITIN, TIBC, IRON, RETICCTPCT,   in the last 72 hours No results found for this basename: LIPASE, AMYLASE,  in the last 72 hours  Urine Studies No results found for this basename: UACOL, UAPR, USPG, UPH, UTP, UGL, UKET, UBIL, UHGB, UNIT, UROB, ULEU, UEPI, UWBC, URBC, UBAC, CAST, CRYS, UCOM, BILUA,  in the last 72 hours  MICROBIOLOGY: No results found for this or any previous visit (from the past 240 hour(s)).  RADIOLOGY STUDIES/RESULTS: Dg Foot Complete Left  09/01/2013   CLINICAL DATA:  Skin ulcerations both feet.  EXAM: LEFT FOOT - COMPLETE 3+ VIEW  COMPARISON:  None.  FINDINGS: Extensive gas is seen tracking in  dorsal and plantar soft tissues of the left foot. Although diffuse, the largest collection of gas is seen about the third, fourth and fifth metatarsals. Soft tissues of the foot are swollen. No radiopaque foreign body is identified. No bony destructive change is seen. 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 Well corticated defect in the head of the middle phalanx of the second toe is identified and may be postoperative.  IMPRESSION: Extensive soft tissue gas in the right foot is consistent with cellulitis and abscess formation. Necrotizing fasciitis can cause this appearance. No plain film evidence of osteomyelitis is identified.   Electronically Signed   By: Inge Rise M.D.   On: 09/01/2013 21:20   Dg Foot Complete Right  09/01/2013   ADDENDUM REPORT: 09/01/2013 21:57  ADDENDUM: This addendum is given for the purpose of noting that refreence to the right foot in the impression is incorrect. The report is for the left foot.   Electronically Signed   By: Inge Rise M.D.   On: 09/01/2013 21:57   09/01/2013   CLINICAL DATA:  Ulcers on both feet.  EXAM: RIGHT FOOT COMPLETE - 3+ VIEW  COMPARISON:  None.  FINDINGS: No bony destructive change, radiopaque foreign body or soft tissue gas collection is identified. There appear to be skin ulcerations in the  lateral margin of the foot. There is no fracture or dislocation. Prominent calcaneal spurs are noted.  IMPRESSION: Skin ulcerations without acute bony or joint abnormality.  Electronically Signed: By: Inge Rise M.D. On: 09/01/2013 21:15    Oren Binet, MD  [facility] Pager:[phone]  If 7PM-7AM, please contact night-coverage [email or web address] Password [email or web address] 09/02/2013, 11:54 AM   LOS: 1 day

## 2013-09-02 NOTE — ED Notes (Signed)
Insulin drip started at 4.6

## 2013-09-02 NOTE — Progress Notes (Signed)
INITIAL NUTRITION ASSESSMENT  DOCUMENTATION CODES Per approved criteria  -Not Applicable   INTERVENTION:  Diet advancement as able after procedure; recommend CHO-modified diet with Glucerna Shake PO TID, each supplement provides 220 kcal and 10 grams of protein.  RD to provide diet education at a more appropriate time.  NUTRITION DIAGNOSIS: Increased nutrient needs related to infected foot wound as evidenced by estimated nutrition needs.   Goal: Intake to meet >90% of estimated nutrition needs to support wound healing; optimal glycemic control to support healing.  Monitor:  PO intake, labs, weight trend.  Reason for Assessment: MD Consult for diabetes diet education  58 y.o. male  Admitting Dx: suspected necrotizing infection of the left foot  ASSESSMENT: Patient with history of hepatitis; presented to the hospital on 2/1 with foot swelling since last week. Patient became incontinent of urine, feeling lightheaded, blurred vision, patient states that he started to loose balance as well. Patient states that his feet are numb since last year. Patient has been wearing same socks and tenniss shoes for past 3 months at least. X-ray on admission showed gas in the soft tissue.   Plans are for MRI left foot to determine extent of debridement needed. Patient with a new diagnosis of diabetes this admission. Currently on insulin infusion with plans to transition to subcutaneous insulin once anion gap has closed.   Discussed patient with his RN. Patient is not appropriate for education today. He is lethargic and NPO, awaiting possible procedure (debriedement of foot wound). Per RN's discussion with patient's sister, patient lives in his car near sister's house. He eats with the family sometimes, but not always.  Potassium supplementation is ongoing. Concern for refeeding syndrome noted.   Height: Ht Readings from Last 1 Encounters:  09/02/13 5\' 3"  (1.6 m)    Weight: Wt Readings from  Last 1 Encounters:  09/02/13 148 lb (67.132 kg)    Ideal Body Weight: 56.4 kg  % Ideal Body Weight: 119%  Wt Readings from Last 10 Encounters:  09/02/13 148 lb (67.132 kg)    Usual Body Weight: unknown  % Usual Body Weight: N/A  BMI:  Body mass index is 26.22 kg/(m^2).  Estimated Nutritional Needs: Kcal: 2000 Protein: 95-110 gm Fluid: 2 L  Skin: left foot necrotic ulcer  Diet Order: NPO  EDUCATION NEEDS: -Education not appropriate at this time   Intake/Output Summary (Last 24 hours) at 09/02/13 1518 Last data filed at 09/02/13 0700  Gross per 24 hour  Intake  511.6 ml  Output    600 ml  Net  -88.4 ml    Last BM: 1/31   Labs:   Recent Labs Lab 09/01/13 2200 09/02/13 0625 09/02/13 1010 09/02/13 1340  NA 133* 134* 140 139  K 3.8 3.0* 2.9* 2.9*  CL 95* 99 106 105  CO2 9* 13* 14* 16*  BUN 28* 25* 23 20  CREATININE 0.89 0.83 0.79 0.84  CALCIUM 9.5 9.0 8.8 8.7  MG  --  2.5  --   --   PHOS  --  0.6* 0.9* 1.3*  GLUCOSE 594* 309* 176* 171*    CBG (last 3)   Recent Labs  09/02/13 1220 09/02/13 1331 09/02/13 1430  GLUCAP 162* 189* 163*    Scheduled Meds: . clindamycin (CLEOCIN) IV  600 mg Intravenous Q8H  . insulin regular  0-10 Units Intravenous TID WC  . piperacillin-tazobactam (ZOSYN)  IV  3.375 g Intravenous Q8H  . potassium phosphate IVPB (mmol)  20 mmol Intravenous Once  .  potassium phosphate IVPB (mmol)  30 mmol Intravenous Once  . sodium chloride  3 mL Intravenous Q12H  . vancomycin  1,000 mg Intravenous Q12H    Continuous Infusions: . dextrose 5 % and 0.9% NaCl 1,000 mL with potassium chloride 20 mEq infusion 125 mL/hr at 09/02/13 1324  . insulin (NOVOLIN-R) infusion 8.2 Units/hr (09/02/13 1437)    Past Medical History  Diagnosis Date  . Hepatitis     Past Surgical History  Procedure Laterality Date  . Cholecystectomy      Molli Barrows, RD, LDN, [place] Pager [phone] After Hours Pager [phone]

## 2013-09-02 NOTE — ED Notes (Signed)
Admitting doctor here to see 

## 2013-09-02 NOTE — Progress Notes (Signed)
Inpatient Diabetes Program Recommendations  AACE/ADA: New Consensus Statement on Inpatient Glycemic Control (2013)  Target Ranges:  Prepandial:   less than 140 mg/dL      Peak postprandial:   less than 180 mg/dL (1-2 hours)      Critically ill patients:  140 - 180 mg/dL   Results for HIRO, DUFOUR (MRN [id number]) as of 09/02/2013 15:13  Ref. Range 09/01/2013 22:00  Sodium Latest Range: 137-147 mEq/L 133 (L)  Potassium Latest Range: 3.7-5.3 mEq/L 3.8  Chloride Latest Range: 96-112 mEq/L 95 (L)  CO2 Latest Range: 19-32 mEq/L 9 (LL)  BUN Latest Range: 6-23 mg/dL 28 (H)  Creatinine Latest Range: 0.50-1.35 mg/dL 0.89  Calcium Latest Range: 8.4-10.5 mg/dL 9.5  GFR calc non Af Amer Latest Range: >90 mL/min >90  GFR calc Af Amer Latest Range: >90 mL/min >90  Glucose Latest Range: 70-99 mg/dL 594 (HH)    **Admitted with Abscess/cellulitis of foot.  New diagnosis of DM.  Currently in DKA.  Diabetes history: New diagnosis of DM this admission. Outpatient Diabetes medications: None.  New diagnosis. Current orders for Inpatient glycemic control: IV insulin drip per GlucoStabilizer.   **Have ordered educational materials for patient.  DM Coordinator to see patient either today or tomorrow to review basic DM information.  RNs to provide ongoing education with patient and family at bedside.  Have ordered RD consult for this patient for DM diet education.  **Noted BMETs ordered Q4 hours.  Anion gap still elevated at 18 as of BMET results from 1340.  Once anion gap closes and patient ready to transition off IV insulin drip, please give Lantus 1-2 hours before IV insulin drip stopped.  Recommend Lantus 13 units (0.2 units/kg dosing).  Please also start Novolog Sensitive SSI once IV insulin drip stopped.  Will follow. Wyn Quaker RN, MSN, CDE Diabetes Coordinator Inpatient Diabetes Program Team Pager: [phone] (8a-10p)

## 2013-09-02 NOTE — ED Provider Notes (Signed)
Medical screening examination/treatment/procedure(s) were conducted as a shared visit with non-physician practitioner(s) and myself.  I personally evaluated the patient during the encounter.  EKG Interpretation   None        Orlie Dakin, MD 09/02/13 [phone]

## 2013-09-02 NOTE — ED Notes (Signed)
Sister back at th bedside.  Waiting on room assignment

## 2013-09-02 NOTE — H&P (Signed)
PCP:  No PCP Per Patient    Chief Complaint:  Left foot swelling  HPI: [name] is a 58 y.o. male   has a past medical history of Hepatitis.   Presented with  Patient states that he had foot swelling since last week. Patient became incontinent of urine, feeling lightheaded, blurred vision, patient states that he started to loose balance as well. Denies any significant chest pain. Patient states that his feet are numb since last year. Patient has been wearing same socks and tenniss shoes for past 3 months at least. Patient was found to have ulcerations of left foot. Plain film was worrisome for presence of air. Orthopedics have been consulted and requests medical admission for antibiotics coverage, MRI in AM and will take to OR in AM.  Hospitalist called for admission.  Review of Systems:   Pertinent positives include: foot swelling, loss of balance  Constitutional:  No weight loss, night sweats, Fevers, chills, fatigue, weight loss  HEENT:  No headaches, Difficulty swallowing,Tooth/dental problems,Sore throat,  No sneezing, itching, ear ache, nasal congestion, post nasal drip,  Cardio-vascular:  No chest pain, Orthopnea, PND, anasarca, dizziness, palpitations.no Bilateral lower extremity swelling  GI:  No heartburn, indigestion, abdominal pain, nausea, vomiting, diarrhea, change in bowel habits, loss of appetite, melena, blood in stool, hematemesis Resp:  no shortness of breath at rest. No dyspnea on exertion, No excess mucus, no productive cough, No non-productive cough, No coughing up of blood.No change in color of mucus.No wheezing. Skin:  no rash or lesions. No jaundice GU:  no dysuria, change in color of urine, no urgency or frequency. No straining to urinate.  No flank pain.  Musculoskeletal:  No joint pain or no joint swelling. No decreased range of motion. No back pain.  Psych:  No change in mood or affect. No depression or anxiety. No memory loss.  Neuro: no  localizing neurological complaints, no tingling, no weakness, no double vision, no gait abnormality, no slurred speech, no confusion  Otherwise ROS are negative except for above, 10 systems were reviewed  Past Medical History: Past Medical History  Diagnosis Date  . Hepatitis    Past Surgical History  Procedure Laterality Date  . Cholecystectomy       Medications: Prior to Admission medications   Not on File    Allergies:  No Known Allergies  Social History:  Ambulatory   independently   States that he Lives with sister, family he actually resides in a car close to house   reports that he has never smoked. He does not have any smokeless tobacco history on file. He reports that he does not drink alcohol or use illicit drugs.   Family History: family history includes Diabetes type II in his mother.    Physical Exam: Patient Vitals for the past 24 hrs:  BP Temp Temp src Pulse Resp SpO2 Height Weight  09/01/13 2357 125/71 mmHg 98.1 F (36.7 C) - 100 18 100 % - -  09/01/13 2300 127/75 mmHg - - 103 - 100 % - -  09/01/13 2245 141/80 mmHg - - 112 - 98 % - -  09/01/13 2230 138/83 mmHg - - 104 - 100 % - -  09/01/13 2215 132/71 mmHg - - 106 - 100 % - -  09/01/13 2209 - - - - - - 5\' 3"  (1.6 m) 65.318 kg (144 lb)  09/01/13 2200 124/67 mmHg - - 106 - 100 % - -  09/01/13 2100 - - - - - -  5\' 3"  (1.6 m) 65.318 kg (144 lb)  09/01/13 1945 137/90 mmHg - - 105 - 100 % - -  09/01/13 1930 143/84 mmHg - - 108 - 100 % - -  09/01/13 1915 134/82 mmHg - - 107 - 100 % - -  09/01/13 1829 115/67 mmHg 97.7 F (36.5 C) Oral 107 18 100 % - -    1. General:  in No Acute distress 2. Psychological: Alert and  Oriented 3. Head/ENT:   Moist  Mucous Membranes                          Head Non traumatic, neck supple                           Poor Dentition 4. SKIN:  decreased Skin turgor,  Skin Dry right lower extremity with area of problem involving all 5 digits and dorsum of foot. Left low  extremities significant for edema, erythema, crepitus foul discharge.  5. Heart: Regular rate and rhythm no Murmur, Rub or gallop 6. Lungs: Clear to auscultation bilaterally, no wheezes or crackles   7. Abdomen: Soft, non-tender, Non distended 8. Lower extremities: no clubbing, cyanosis, edema  left foot 9. Neurologically Grossly intact, moving all 4 extremities equally 10. MSK: Normal range of motion  body mass index is 25.51 kg/(m^2).   Labs on Admission:   Recent Labs  09/01/13 2200  NA 133*  K 3.8  CL 95*  CO2 9*  GLUCOSE 594*  BUN 28*  CREATININE 0.89  CALCIUM 9.5   No results found for this basename: AST, ALT, ALKPHOS, BILITOT, PROT, ALBUMIN,  in the last 72 hours No results found for this basename: LIPASE, AMYLASE,  in the last 72 hours  Recent Labs  09/01/13 2015  WBC 26.8*  HGB 15.0  HCT 40.9  MCV 85.6  PLT 309   No results found for this basename: CKTOTAL, CKMB, CKMBINDEX, TROPONINI,  in the last 72 hours No results found for this basename: TSH, T4TOTAL, FREET3, T3FREE, THYROIDAB,  in the last 72 hours No results found for this basename: VITAMINB12, FOLATE, FERRITIN, TIBC, IRON, RETICCTPCT,  in the last 72 hours No results found for this basename: HGBA1C    Estimated Creatinine Clearance: 75.5 ml/min (by C-G formula based on Cr of 0.89). ABG No results found for this basename: phart, pco2, po2, hco3, tco2, acidbasedef, o2sat     No results found for this basename: DDIMER     Other results:   Cultures: No results found for this basename: sdes, specrequest, cult, reptstatus       Radiological Exams on Admission: Dg Foot Complete Left  09/01/2013   CLINICAL DATA:  Skin ulcerations both feet.  EXAM: LEFT FOOT - COMPLETE 3+ VIEW  COMPARISON:  None.  FINDINGS: Extensive gas is seen tracking in dorsal and plantar soft tissues of the left foot. Although diffuse, the largest collection of gas is seen about the third, fourth and fifth metatarsals. Soft  tissues of the foot are swollen. No radiopaque foreign body is identified. No bony destructive change is seen. 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 Well corticated defect in the head of the middle phalanx of the second toe is identified and may be postoperative.  IMPRESSION: Extensive soft tissue gas in the right foot is consistent with cellulitis and abscess formation. Necrotizing  fasciitis can cause this appearance. No plain film evidence of osteomyelitis is identified.   Electronically Signed   By: Inge Rise M.D.   On: 09/01/2013 21:20   Dg Foot Complete Right  09/01/2013   ADDENDUM REPORT: 09/01/2013 21:57  ADDENDUM: This addendum is given for the purpose of noting that refreence to the right foot in the impression is incorrect. The report is for the left foot.   Electronically Signed   By: Inge Rise M.D.   On: 09/01/2013 21:57   09/01/2013   CLINICAL DATA:  Ulcers on both feet.  EXAM: RIGHT FOOT COMPLETE - 3+ VIEW  COMPARISON:  None.  FINDINGS: No bony destructive change, radiopaque foreign body or soft tissue gas collection is identified. There appear to be skin ulcerations in the lateral margin of the foot. There is no fracture or dislocation. Prominent calcaneal spurs are noted.  IMPRESSION: Skin ulcerations without acute bony or joint abnormality.  Electronically Signed: By: Inge Rise M.D. On: 09/01/2013 21:15    Chart has been reviewed  Assessment/Plan 58 year old male with history of homelessness he had a severe left foot infection in a setting of uncontrolled diabetes and likely diabetic neuropathy  Present on Admission:  . Cellulitis and abscess of foot - Dr. Erlinda Hong  with orthopedics is aware of the patient. Plan for OR. Time in a.m. For now n.p.o. MRI with contrast in a.m.  . DM (diabetes mellitus) type II controlled, neurological manifestation - will start glucose stabilizer. Obtain hemoglobin A1c. Diabetic  education.  Hx of hepatitis will obtain hepatitis serologies to confirm  Prophylaxis: SCD,   CODE STATUS: FULL CODE  Other plan as per orders.  I have spent a total of 55 min on this admission  Bettyjo Lundblad 09/02/2013, 12:24 AM

## 2013-09-02 NOTE — Progress Notes (Signed)
Utilization review completed.  

## 2013-09-02 NOTE — Progress Notes (Signed)
   Subjective:  Patient reports pain as moderate.  No clinical worsening overnight.  Objective:   VITALS:   Filed Vitals:   09/01/13 2300 09/01/13 2357 09/02/13 0349 09/02/13 0500  BP: 127/75 125/71 136/74 136/74  Pulse: 103 100 107 107  Temp:  98.1 F (36.7 C) 98.5 F (36.9 C) 98.5 F (36.9 C)  TempSrc:      Resp:  18 20 20   Height:   5\' 3"  (1.6 m)   Weight:   67.132 kg (148 lb)   SpO2: 100% 100% 100% 100%    Exam is unchanged from initial consult   Lab Results  Component Value Date   WBC 26.8* 09/01/2013   HGB 15.0 09/01/2013   HCT 40.9 09/01/2013   MCV 85.6 09/01/2013   PLT 309 09/01/2013     Assessment/Plan:     Problem List Items Addressed This Visit     Other   Abscess of foot    Other Visit Diagnoses   Left foot infection    -  Primary    Relevant Medications       ceFAZolin (ANCEF) IVPB 1 g/50 mL premix (Completed)       vancomycin (VANCOCIN) IVPB 1000 mg/200 mL premix (Completed)       clindamycin (CLEOCIN) IVPB 600 mg    Right foot infection        DKA (diabetic ketoacidoses)        Relevant Medications       insulin regular bolus via infusion 0-10 Units    Hyponatremia        Hypochloremia        Diabetes mellitus type 2, uncontrolled, with complications          - NPO - await MRI results - continue IV abx - will follow  Marianna Payment 09/02/2013, 8:14 AM [phone]

## 2013-09-03 ENCOUNTER — Inpatient Hospital Stay (HOSPITAL_COMMUNITY): Payer: Medicaid Other | Admitting: Certified Registered Nurse Anesthetist

## 2013-09-03 ENCOUNTER — Encounter (HOSPITAL_COMMUNITY)
Admission: EM | Disposition: A | Discharge status: Rehabilitation Facility | Payer: Self-pay | Source: Home / Self Care | Attending: Internal Medicine

## 2013-09-03 ENCOUNTER — Encounter (HOSPITAL_COMMUNITY): Payer: Medicaid Other | Admitting: Certified Registered Nurse Anesthetist

## 2013-09-03 DIAGNOSIS — E876 Hypokalemia: Secondary | ICD-10-CM

## 2013-09-03 HISTORY — PX: I & D EXTREMITY: SHX5045

## 2013-09-03 LAB — GLUCOSE, CAPILLARY
GLUCOSE-CAPILLARY: 206 mg/dL — AB (ref 70–99)
GLUCOSE-CAPILLARY: 154 mg/dL — AB (ref 70–99)
GLUCOSE-CAPILLARY: 151 mg/dL — AB (ref 70–99)
GLUCOSE-CAPILLARY: 125 mg/dL — AB (ref 70–99)
GLUCOSE-CAPILLARY: 175 mg/dL — AB (ref 70–99)
GLUCOSE-CAPILLARY: 165 mg/dL — AB (ref 70–99)
GLUCOSE-CAPILLARY: 210 mg/dL — AB (ref 70–99)
GLUCOSE-CAPILLARY: 264 mg/dL — AB (ref 70–99)
GLUCOSE-CAPILLARY: 170 mg/dL — AB (ref 70–99)
Glucose-Capillary: 136 mg/dL — ABNORMAL HIGH (ref 70–99)
Glucose-Capillary: 149 mg/dL — ABNORMAL HIGH (ref 70–99)
Glucose-Capillary: 151 mg/dL — ABNORMAL HIGH (ref 70–99)
Glucose-Capillary: 165 mg/dL — ABNORMAL HIGH (ref 70–99)
Glucose-Capillary: 169 mg/dL — ABNORMAL HIGH (ref 70–99)
Glucose-Capillary: 171 mg/dL — ABNORMAL HIGH (ref 70–99)
Glucose-Capillary: 177 mg/dL — ABNORMAL HIGH (ref 70–99)
Glucose-Capillary: 182 mg/dL — ABNORMAL HIGH (ref 70–99)
Glucose-Capillary: 211 mg/dL — ABNORMAL HIGH (ref 70–99)
Glucose-Capillary: 238 mg/dL — ABNORMAL HIGH (ref 70–99)
Glucose-Capillary: 262 mg/dL — ABNORMAL HIGH (ref 70–99)
Glucose-Capillary: 284 mg/dL — ABNORMAL HIGH (ref 70–99)

## 2013-09-03 LAB — BASIC METABOLIC PANEL
BUN: 18 mg/dL (ref 6–23)
BUN: 16 mg/dL (ref 6–23)
BUN: 16 mg/dL (ref 6–23)
BUN: 16 mg/dL (ref 6–23)
BUN: 14 mg/dL (ref 6–23)
CALCIUM: 7.9 mg/dL — AB (ref 8.4–10.5)
CHLORIDE: 107 meq/L (ref 96–112)
CHLORIDE: 109 meq/L (ref 96–112)
CHLORIDE: 107 meq/L (ref 96–112)
CO2: 15 meq/L — AB (ref 19–32)
CO2: 16 mEq/L — ABNORMAL LOW (ref 19–32)
CO2: 16 meq/L — AB (ref 19–32)
CO2: 15 meq/L — AB (ref 19–32)
CO2: 16 meq/L — AB (ref 19–32)
Calcium: 8.4 mg/dL (ref 8.4–10.5)
Calcium: 8.4 mg/dL (ref 8.4–10.5)
Calcium: 7.8 mg/dL — ABNORMAL LOW (ref 8.4–10.5)
Calcium: 7.8 mg/dL — ABNORMAL LOW (ref 8.4–10.5)
Chloride: 105 mEq/L (ref 96–112)
Chloride: 108 mEq/L (ref 96–112)
Creatinine, Ser: 0.91 mg/dL (ref 0.50–1.35)
Creatinine, Ser: 0.92 mg/dL (ref 0.50–1.35)
Creatinine, Ser: 0.91 mg/dL (ref 0.50–1.35)
Creatinine, Ser: 0.92 mg/dL (ref 0.50–1.35)
Creatinine, Ser: 0.77 mg/dL (ref 0.50–1.35)
GFR calc Af Amer: >90 mL/min (ref >90)
GFR calc Af Amer: >90 mL/min (ref >90)
GFR calc Af Amer: >90 mL/min (ref >90)
GFR calc Af Amer: >90 mL/min (ref >90)
GFR calc non Af Amer: >90 mL/min (ref >90)
GFR calc non Af Amer: >90 mL/min (ref >90)
GFR calc non Af Amer: >90 mL/min (ref >90)
GFR calc non Af Amer: >90 mL/min (ref >90)
GLUCOSE: 210 mg/dL — AB (ref 70–99)
Glucose, Bld: 156 mg/dL — ABNORMAL HIGH (ref 70–99)
Glucose, Bld: 135 mg/dL — ABNORMAL HIGH (ref 70–99)
Glucose, Bld: 174 mg/dL — ABNORMAL HIGH (ref 70–99)
Glucose, Bld: 186 mg/dL — ABNORMAL HIGH (ref 70–99)
POTASSIUM: 3.1 meq/L — AB (ref 3.7–5.3)
POTASSIUM: 3.3 meq/L — AB (ref 3.7–5.3)
POTASSIUM: 3.2 meq/L — AB (ref 3.7–5.3)
Potassium: 3.4 mEq/L — ABNORMAL LOW (ref 3.7–5.3)
Potassium: 3.7 mEq/L (ref 3.7–5.3)
SODIUM: 138 meq/L (ref 137–147)
SODIUM: 139 meq/L (ref 137–147)
Sodium: 140 mEq/L (ref 137–147)
Sodium: 138 mEq/L (ref 137–147)
Sodium: 137 mEq/L (ref 137–147)

## 2013-09-03 LAB — CBC
HCT: 32.6 % — ABNORMAL LOW (ref 39.0–52.0)
HEMOGLOBIN: 12.3 g/dL — AB (ref 13.0–17.0)
MCH: 30.6 pg (ref 26.0–34.0)
MCHC: 37.7 g/dL — ABNORMAL HIGH (ref 30.0–36.0)
MCV: 81.1 fL (ref 78.0–100.0)
Platelets: 245 10*3/uL (ref 150–400)
RBC: 4.02 MIL/uL — AB (ref 4.22–5.81)
RDW: 13.1 % (ref 11.5–15.5)
WBC: 22.3 10*3/uL — ABNORMAL HIGH (ref 4.0–10.5)

## 2013-09-03 SURGERY — IRRIGATION AND DEBRIDEMENT EXTREMITY
Anesthesia: General | Laterality: Left

## 2013-09-03 MED ORDER — HYDROMORPHONE HCL PF 1 MG/ML IJ SOLN
INTRAMUSCULAR | Status: AC
  Filled 2013-09-03: qty 1

## 2013-09-03 MED ORDER — MIDAZOLAM HCL 5 MG/5ML IJ SOLN
INTRAMUSCULAR | Status: DC | PRN
  Administered 2013-09-03: 1 mg via INTRAVENOUS

## 2013-09-03 MED ORDER — POTASSIUM CHLORIDE 10 MEQ/100ML IV SOLN
10.0000 meq | INTRAVENOUS | Status: AC
  Filled 2013-09-03 (×2): qty 100

## 2013-09-03 MED ORDER — LACTATED RINGERS IV SOLN
INTRAVENOUS | Status: DC | PRN
  Administered 2013-09-03: 11:00:00 via INTRAVENOUS

## 2013-09-03 MED ORDER — PROPOFOL 10 MG/ML IV BOLUS
INTRAVENOUS | Status: DC | PRN
  Administered 2013-09-03: 150 mg via INTRAVENOUS

## 2013-09-03 MED ORDER — FENTANYL CITRATE 0.05 MG/ML IJ SOLN
INTRAMUSCULAR | Status: DC | PRN
  Administered 2013-09-03: 100 ug via INTRAVENOUS
  Administered 2013-09-03 (×2): 25 ug via INTRAVENOUS

## 2013-09-03 MED ORDER — ACETAMINOPHEN 325 MG PO TABS
650.0000 mg | ORAL_TABLET | ORAL | Status: DC | PRN
  Administered 2013-09-03 – 2013-09-12 (×3): 650 mg via ORAL
  Filled 2013-09-03 (×3): qty 2

## 2013-09-03 MED ORDER — PHENYLEPHRINE 40 MCG/ML (10ML) SYRINGE FOR IV PUSH (FOR BLOOD PRESSURE SUPPORT)
PREFILLED_SYRINGE | INTRAVENOUS | Status: AC
  Filled 2013-09-03: qty 10

## 2013-09-03 MED ORDER — FENTANYL CITRATE 0.05 MG/ML IJ SOLN
INTRAMUSCULAR | Status: AC
  Filled 2013-09-03: qty 5

## 2013-09-03 MED ORDER — OXYCODONE HCL 5 MG PO TABS
5.0000 mg | ORAL_TABLET | ORAL | Status: DC | PRN
  Administered 2013-09-11 – 2013-09-12 (×3): 10 mg via ORAL
  Filled 2013-09-03 (×3): qty 2

## 2013-09-03 MED ORDER — MIDAZOLAM HCL 2 MG/2ML IJ SOLN
INTRAMUSCULAR | Status: AC
  Filled 2013-09-03: qty 2

## 2013-09-03 MED ORDER — HYDROMORPHONE HCL PF 1 MG/ML IJ SOLN
0.2500 mg | INTRAMUSCULAR | Status: DC | PRN
  Administered 2013-09-03: 0.5 mg via INTRAVENOUS

## 2013-09-03 MED ORDER — PHENYLEPHRINE HCL 10 MG/ML IJ SOLN
INTRAMUSCULAR | Status: DC | PRN
  Administered 2013-09-03 (×2): 40 ug via INTRAVENOUS

## 2013-09-03 MED ORDER — HYDROCODONE-ACETAMINOPHEN 5-325 MG PO TABS: 1.0000 | ORAL_TABLET | ORAL | Status: DC | PRN

## 2013-09-03 MED ORDER — DIPHENHYDRAMINE HCL 12.5 MG/5ML PO ELIX
25.0000 mg | ORAL_SOLUTION | ORAL | Status: DC | PRN
  Filled 2013-09-03: qty 10

## 2013-09-03 MED ORDER — ONDANSETRON HCL 4 MG/2ML IJ SOLN
INTRAMUSCULAR | Status: DC | PRN
Start: 2013-09-03 — End: 2013-09-03
  Administered 2013-09-03: 4 mg via INTRAVENOUS

## 2013-09-03 MED ORDER — LIDOCAINE HCL (CARDIAC) 20 MG/ML IV SOLN
INTRAVENOUS | Status: AC
Start: 2013-09-03 — End: 2013-09-03
  Filled 2013-09-03: qty 5

## 2013-09-03 MED ORDER — OXYCODONE HCL 5 MG PO TABS: 5.0000 mg | ORAL_TABLET | Freq: Once | ORAL | Status: DC | PRN

## 2013-09-03 MED ORDER — ONDANSETRON HCL 4 MG/2ML IJ SOLN
INTRAMUSCULAR | Status: AC
  Filled 2013-09-03: qty 2

## 2013-09-03 MED ORDER — LIDOCAINE HCL (CARDIAC) 20 MG/ML IV SOLN
INTRAVENOUS | Status: DC | PRN
  Administered 2013-09-03: 80 mg via INTRAVENOUS

## 2013-09-03 MED ORDER — GADOBENATE DIMEGLUMINE 529 MG/ML IV SOLN
15.0000 mL | Freq: Once | INTRAVENOUS | Status: AC | PRN
  Administered 2013-09-03: 15 mL via INTRAVENOUS

## 2013-09-03 MED ORDER — POTASSIUM CHLORIDE 10 MEQ/100ML IV SOLN: 10.0000 meq | INTRAVENOUS | Status: DC

## 2013-09-03 MED ORDER — LACTATED RINGERS IV SOLN
INTRAVENOUS | Status: DC
  Administered 2013-09-03: 11:00:00 via INTRAVENOUS

## 2013-09-03 MED ORDER — PROMETHAZINE HCL 25 MG/ML IJ SOLN: 6.2500 mg | INTRAMUSCULAR | Status: DC | PRN

## 2013-09-03 MED ORDER — MORPHINE SULFATE 2 MG/ML IJ SOLN: 1.0000 mg | INTRAMUSCULAR | Status: DC | PRN

## 2013-09-03 MED ORDER — PROPOFOL 10 MG/ML IV BOLUS
INTRAVENOUS | Status: AC
  Filled 2013-09-03: qty 20

## 2013-09-03 MED ORDER — OXYCODONE HCL 5 MG/5ML PO SOLN: 5.0000 mg | Freq: Once | ORAL | Status: DC | PRN

## 2013-09-03 MED ORDER — POTASSIUM CHLORIDE 10 MEQ/100ML IV SOLN
10.0000 meq | INTRAVENOUS | Status: AC
  Administered 2013-09-03 (×3): 10 meq via INTRAVENOUS
  Filled 2013-09-03 (×4): qty 100

## 2013-09-03 MED ORDER — SODIUM CHLORIDE 0.9 % IR SOLN
Status: DC | PRN
  Administered 2013-09-03: 9000 mL

## 2013-09-03 SURGICAL SUPPLY — 65 items
BANDAGE CONFORM 3  STR LF (GAUZE/BANDAGES/DRESSINGS) IMPLANT
BANDAGE ELASTIC 3 VELCRO ST LF (GAUZE/BANDAGES/DRESSINGS) IMPLANT
BLADE SURG 10 STRL SS (BLADE) ×6 IMPLANT
BNDG COHESIVE 1X5 TAN STRL LF (GAUZE/BANDAGES/DRESSINGS) IMPLANT
BNDG COHESIVE 4X5 TAN STRL (GAUZE/BANDAGES/DRESSINGS) ×3 IMPLANT
BNDG COHESIVE 6X5 TAN STRL LF (GAUZE/BANDAGES/DRESSINGS) ×6 IMPLANT
BNDG GAUZE STRTCH 6 (GAUZE/BANDAGES/DRESSINGS) ×9 IMPLANT
CANISTER WOUND CARE 500ML ATS (WOUND CARE) ×3 IMPLANT
CLOTH BEACON ORANGE TIMEOUT ST (SAFETY) ×3 IMPLANT
CORDS BIPOLAR (ELECTRODE) IMPLANT
COVER SURGICAL LIGHT HANDLE (MISCELLANEOUS) ×3 IMPLANT
CUFF TOURNIQUET SINGLE 18IN (TOURNIQUET CUFF) ×3 IMPLANT
CUFF TOURNIQUET SINGLE 24IN (TOURNIQUET CUFF) IMPLANT
CUFF TOURNIQUET SINGLE 34IN LL (TOURNIQUET CUFF) IMPLANT
CUFF TOURNIQUET SINGLE 44IN (TOURNIQUET CUFF) IMPLANT
DRAPE INCISE IOBAN 66X45 STRL (DRAPES) ×3 IMPLANT
DRAPE ORTHO SPLIT 77X108 STRL (DRAPES) ×4
DRAPE SURG 17X23 STRL (DRAPES) IMPLANT
DRAPE SURG ORHT 6 SPLT 77X108 (DRAPES) ×2 IMPLANT
DRAPE U-SHAPE 47X51 STRL (DRAPES) ×3 IMPLANT
DRSG VAC ATS MED SENSATRAC (GAUZE/BANDAGES/DRESSINGS) ×3 IMPLANT
DURAPREP 26ML APPLICATOR (WOUND CARE) ×3 IMPLANT
ELECT CAUTERY BLADE 6.4 (BLADE) ×3 IMPLANT
ELECT REM PT RETURN 9FT ADLT (ELECTROSURGICAL)
ELECTRODE REM PT RTRN 9FT ADLT (ELECTROSURGICAL) IMPLANT
EVACUATOR 1/8 PVC DRAIN (DRAIN) ×3 IMPLANT
FACESHIELD LNG OPTICON STERILE (SAFETY) IMPLANT
GAUZE XEROFORM 1X8 LF (GAUZE/BANDAGES/DRESSINGS) ×3 IMPLANT
GLOVE BIOGEL PI IND STRL 7.0 (GLOVE) ×4 IMPLANT
GLOVE BIOGEL PI INDICATOR 7.0 (GLOVE) ×8
GLOVE SURG SS PI 7.0 STRL IVOR (GLOVE) ×3 IMPLANT
GLOVE SURG SS PI 7.5 STRL IVOR (GLOVE) ×6 IMPLANT
GOWN STRL NON-REIN LRG LVL3 (GOWN DISPOSABLE) ×3 IMPLANT
GOWN STRL REIN XL XLG (GOWN DISPOSABLE) ×3 IMPLANT
HANDPIECE INTERPULSE COAX TIP (DISPOSABLE)
IV SOD CHL 0.9% 1000ML (IV SOLUTION) ×27 IMPLANT
KIT BASIN OR (CUSTOM PROCEDURE TRAY) ×3 IMPLANT
KIT ROOM TURNOVER OR (KITS) ×3 IMPLANT
MANIFOLD NEPTUNE II (INSTRUMENTS) ×3 IMPLANT
NS IRRIG 1000ML POUR BTL (IV SOLUTION) ×3 IMPLANT
PACK ORTHO EXTREMITY (CUSTOM PROCEDURE TRAY) ×3 IMPLANT
PAD ARMBOARD 7.5X6 YLW CONV (MISCELLANEOUS) ×6 IMPLANT
PADDING CAST ABS 4INX4YD NS (CAST SUPPLIES) ×4
PADDING CAST ABS COTTON 4X4 ST (CAST SUPPLIES) ×2 IMPLANT
PADDING CAST COTTON 6X4 STRL (CAST SUPPLIES) ×3 IMPLANT
SET HNDPC FAN SPRY TIP SCT (DISPOSABLE) IMPLANT
SPONGE GAUZE 4X4 12PLY (GAUZE/BANDAGES/DRESSINGS) ×3 IMPLANT
SPONGE LAP 18X18 X RAY DECT (DISPOSABLE) ×3 IMPLANT
STOCKINETTE IMPERVIOUS 9X36 MD (GAUZE/BANDAGES/DRESSINGS) ×3 IMPLANT
SUT ETHILON 2 0 FS 18 (SUTURE) ×9 IMPLANT
SUT ETHILON 3 0 PS 1 (SUTURE) ×6 IMPLANT
SUT VIC AB 2-0 FS1 27 (SUTURE) ×3 IMPLANT
SWAB CULTURE LIQUID MINI MALE (MISCELLANEOUS) ×3 IMPLANT
SYR CONTROL 10ML LL (SYRINGE) IMPLANT
TOWEL OR 17X24 6PK STRL BLUE (TOWEL DISPOSABLE) ×3 IMPLANT
TOWEL OR 17X26 10 PK STRL BLUE (TOWEL DISPOSABLE) ×3 IMPLANT
TOWEL OR NON WOVEN STRL DISP B (DISPOSABLE) ×3 IMPLANT
TUBE ANAEROBIC SPECIMEN COL (MISCELLANEOUS) ×3 IMPLANT
TUBE CONNECTING 12'X1/4 (SUCTIONS) ×1
TUBE CONNECTING 12X1/4 (SUCTIONS) ×2 IMPLANT
TUBE FEEDING 5FR 15 INCH (TUBING) IMPLANT
TUBING CYSTO DISP (UROLOGICAL SUPPLIES) ×3 IMPLANT
UNDERPAD 30X30 INCONTINENT (UNDERPADS AND DIAPERS) ×3 IMPLANT
WATER STERILE IRR 1000ML POUR (IV SOLUTION) ×3 IMPLANT
YANKAUER SUCT BULB TIP NO VENT (SUCTIONS) ×3 IMPLANT

## 2013-09-03 NOTE — Anesthesia Preprocedure Evaluation (Addendum)
Anesthesia Evaluation  Patient identified by MRN, date of birth, ID band Patient awake    Reviewed: Allergy & Precautions, H&P , NPO status , Patient's Chart, lab work & pertinent test results  History of Anesthesia Complications Negative for: history of anesthetic complications  Airway Mallampati: I TM Distance: >3 FB Neck ROM: Full    Dental  (+) Edentulous Upper, Edentulous Lower and Dental Advisory Given   Pulmonary neg pulmonary ROS,    Pulmonary exam normal       Cardiovascular negative cardio ROS      Neuro/Psych negative neurological ROS     GI/Hepatic negative GI ROS, (+) Hepatitis -  Endo/Other  diabetes  Renal/GU      Musculoskeletal   Abdominal   Peds  Hematology   Anesthesia Other Findings   Reproductive/Obstetrics                          Anesthesia Physical Anesthesia Plan  ASA: III  Anesthesia Plan: General   Post-op Pain Management:    Induction: Intravenous  Airway Management Planned: LMA  Additional Equipment:   Intra-op Plan:   Post-operative Plan: Extubation in OR  Informed Consent: I have reviewed the patients History and Physical, chart, labs and discussed the procedure including the risks, benefits and alternatives for the proposed anesthesia with the patient or authorized representative who has indicated his/her understanding and acceptance.   Dental advisory given  Plan Discussed with: CRNA, Anesthesiologist and Surgeon  Anesthesia Plan Comments:         Anesthesia Quick Evaluation

## 2013-09-03 NOTE — Anesthesia Postprocedure Evaluation (Signed)
  Anesthesia Post-op Note  Patient: [name]  Procedure(s) Performed: Procedure(s): IRRIGATION AND DEBRIDEMENT EXTREMITY (Left)  Patient Location: PACU  Anesthesia Type:General  Level of Consciousness: awake  Airway and Oxygen Therapy: Patient Spontanous Breathing  Post-op Pain: mild  Post-op Assessment: Post-op Vital signs reviewed  Post-op Vital Signs: stable  Complications: No apparent anesthesia complications

## 2013-09-03 NOTE — Care Management Note (Unsigned)
    Page 1 of 1   09/09/2013     4:23:07 PM   CARE MANAGEMENT NOTE 09/09/2013  Patient:  [name], [name]   Account Number:  [id number]  Date Initiated:  09/03/2013  Documentation initiated by:  GRAVES-BIGELOW,Deakin Lacek  Subjective/Objective Assessment:   Pt admitted for uncontrolled Diabetes and abscess of foot.     Action/Plan:   CM will continue to monitor for disposition needs. CM did call to the [facility] for pt f/u appointment and orange card eligibility. Awaiting call back.   Anticipated DC Date:  09/05/2013   Anticipated DC Plan:  [place]  CM consult  [facility]      Choice offered to / List presented to:             Status of service:  In process, will continue to follow Medicare Important Message given?   (If response is "NO", the following Medicare IM given date fields will be blank) Date Medicare IM given:   Date Additional Medicare IM given:    Discharge Disposition:    Per UR Regulation:  Reviewed for med. necessity/level of care/duration of stay  If discussed at [place] of Stay Meetings, dates discussed:    Comments:  09-09-13 [place] [id number] Plan for BK on Wednesday. CM will continue to monitor for disposition needs.  09-05-13 [address] [place] [id number] Pt admitted for uncontrolled Diabetes and abscess of foot. Still on iv insulin gtt. Plan for CIR vs SNF when medically stable. CM will continue to monitor for additional d/c needs.  09-04-13  [place], [place] [phone] CM did make a pt a f/u appointment at the [facility] and [facility]. Will continue to monitor.

## 2013-09-03 NOTE — Op Note (Signed)
Date of surgery: 09/03/2048  Preoperative diagnosis: Left foot infection and abscess concerning for necrotizing fasciitis  Postoperative diagnosis: Same  Procedure: 1. Debridement of bone, muscle, skin, soft tissue. CPT 11042 2. Fifth ray amputation. 3. 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 4. Application of negative pressure wound therapy greater than 50 cm.  Surgeon: [name], M.D.  Anesthesia: Gen.  Estimated blood loss: 200 cc  Specimens: None  Cultures: Two  Indications for procedure: [name] is a 58 year old gentleman who presented to the was [place] on 09/01/2013 with an obvious left foot infection. Workup revealed a severe infection left foot with abscess and fasciitis and tenosynovitis. The risks, benefits, and alternatives to surgery were discussed with the patient and he elected to proceed with the above mentioned procedures.  Description of procedure: The patient was identified in the preoperative holding area. The operative site and procedures were confirmed by the patient and marked by the surgeon. He was brought back to the operating room. His placed supine on the table. General anesthesia was induced by the anesthesiologist. The left lower extremity was prepped and draped in standard sterile fashion. A timeout was performed. His empiric antibiotics were given at regular intervals. We began the procedure by excising full-thickness flaps from the lateral and plantar and dorsal aspect of the foot. There was a massive amount of purulence and foul smelling drainage. There was an extensive amount of infection deep in the foot which involved the muscle, tendon, and bone. The fifth ray was amputated back to the level of the Lisfranc joint. The fourth toe was amputated through the MTP joint. All of the flexor and extensor tendon compartments were tracked proximally and decompressed. There was a large amount of plus was expelled from these compartments.  Sharp excisional debridement of all infected and suspicious tissue was carried out using a knife and rongeur. After this was done 9 L of normal saline was irrigated through the wound. Hemostasis was then obtained. The wound measured 15 cm x 10 cm. A wound VAC was placed in the wound and placed on -125 mmHg suction. The patient awoke from anesthesia uneventfully and was transferred to the PACU in stable condition.  Disposition: The patient will be admitted back to the step down unit. The plan is to back the wound and allow it to declare itself. I also plan on watching the right foot for clinical changes. I suspected this time the given the level of infection and its proximal extent the best option for this patient is to undergo a transtibial amputation. We will follow his wound and his clinical progress and if needed I will plan on performing the transtibial amputation next Wednesday.  Azucena Cecil, MD [place] 1:01 PM

## 2013-09-03 NOTE — Progress Notes (Addendum)
Inpatient Diabetes Program Recommendations  AACE/ADA: New Consensus Statement on Inpatient Glycemic Control (2013)  Target Ranges:  Prepandial:   less than 140 mg/dL      Peak postprandial:   less than 180 mg/dL (1-2 hours)      Critically ill patients:  140 - 180 mg/dL     Results for LE, CLOUGHERTY (MRN [id number]) as of 09/03/2013 08:52  Ref. Range 09/03/2013 06:40  Sodium Latest Range: 137-147 mEq/L 140  Potassium Latest Range: 3.7-5.3 mEq/L 3.3 (L)  Chloride Latest Range: 96-112 mEq/L 107  CO2 Latest Range: 19-32 mEq/L 16 (L)  BUN Latest Range: 6-23 mg/dL 16  Creatinine Latest Range: 0.50-1.35 mg/dL 0.92  Calcium Latest Range: 8.4-10.5 mg/dL 8.4  GFR calc non Af Amer Latest Range: >90 mL/min >90  GFR calc Af Amer Latest Range: >90 mL/min >90  Glucose Latest Range: 70-99 mg/dL 135 (H)    **CO2 still low this AM.  Anion gap based on the above BMET result is still elevated at 17.  Patient remains on IV insulin drip per GlucoStabilizer.    **Once anion gap closes and patient ready to transition off IV insulin drip, please give Lantus 1-2 hours before IV insulin drip stopped. Recommend Lantus 13 units (0.2 units/kg dosing). Please also start Novolog Sensitive SSI once IV insulin drip stopped.    **Based on patient's A1c of 12.2%, patient will likely need insulin for home.    Will follow. Wyn Quaker RN, MSN, CDE Diabetes Coordinator Inpatient Diabetes Program Team Pager: [phone] (8a-10p)

## 2013-09-03 NOTE — H&P (Signed)

## 2013-09-03 NOTE — Progress Notes (Signed)
PATIENT DETAILS Name: [name] Age: 58 y.o. Sex: male Date of Birth: [birth year] Admit Date: 09/01/2013 Admitting Physician Toy Baker, MD PCP:No PCP Per Patient  Subjective: No major complaints-essentially unchanged  Assessment/Plan: Suspected necrotizing infection of the left foot - Patient presented with a foul-smelling ulceration of the left foot, x-ray on admission showed gas in the soft tissue. Patient had significant leukocytosis. Patient was subsequently admitted, orthopedics was consulted. - Currently on vancomycin, Zosyn and clindamycin-day 2 -  MRI left foot confirms necrotizing infectopms-await ortho input-needs debridement   Black appearing toe-right foot -check ABI -suspect major problem in the left foot, but need to watch the right foot as well -will d/w ortho  Diabetic ketoacidosis - Currently on an insulin infusion, but steadily improved. We will transition to subcutaneous insulin once anion gap has closed. - New diagnosis of diabetes this admission, A1c 12.2-will require Insulin on discharge  Hypokalemia - Continue to replace potassium. - Bmet every 4 hours for now  Hypophosphatemia - Secondary to poor nutritional status, insulin infusion. - Continue to supplement potassium, watch for refeeding syndrome -recheck PO4 in am  Disposition: Remain inpatient  DVT Prophylaxis:  SCD's  Code Status: Full code   Family Communication Sister at bedside  Procedures:  None  CONSULTS:  orthopedic surgery  Time spent 40 minutes-which includes 50% of the time with face-to-face with patient/ family and coordinating care related to the above assessment and plan.   MEDICATIONS: Scheduled Meds: . clindamycin (CLEOCIN) IV  600 mg Intravenous Q8H  . insulin regular  0-10 Units Intravenous TID WC  . piperacillin-tazobactam (ZOSYN)  IV  3.375 g Intravenous Q8H  . potassium chloride  10 mEq Intravenous Q1 Hr x 2  . sodium chloride  3 mL  Intravenous Q12H  . vancomycin  1,000 mg Intravenous Q12H   Continuous Infusions: . dextrose 5 % and 0.9% NaCl 1,000 mL with potassium chloride 20 mEq infusion 125 mL/hr at 09/02/13 1324  . insulin (NOVOLIN-R) infusion 4.6 Units/hr (09/03/13 0646)   PRN Meds:.dextrose, morphine injection, ondansetron (ZOFRAN) IV, ondansetron  Antibiotics: Anti-infectives   Start     Dose/Rate Route Frequency Ordered Stop   09/02/13 1100  vancomycin (VANCOCIN) IVPB 1000 mg/200 mL premix     1,000 mg 200 mL/hr over 60 Minutes Intravenous Every 12 hours 09/02/13 0938     09/02/13 0800  clindamycin (CLEOCIN) IVPB 600 mg     600 mg 100 mL/hr over 30 Minutes Intravenous 3 times per day 09/02/13 0714     09/02/13 0600  piperacillin-tazobactam (ZOSYN) IVPB 3.375 g  Status:  Discontinued     3.375 g 100 mL/hr over 30 Minutes Intravenous 3 times per day 09/02/13 0351 09/02/13 0352   09/02/13 0600  piperacillin-tazobactam (ZOSYN) IVPB 3.375 g     3.375 g 12.5 mL/hr over 240 Minutes Intravenous Every 8 hours 09/02/13 0353     09/01/13 2315  vancomycin (VANCOCIN) IVPB 1000 mg/200 mL premix     1,000 mg 200 mL/hr over 60 Minutes Intravenous  Once 09/01/13 2311 09/02/13 0053   09/01/13 2315  piperacillin-tazobactam (ZOSYN) IVPB 3.375 g     3.375 g 100 mL/hr over 30 Minutes Intravenous  Once 09/01/13 2311 09/01/13 2351   09/01/13 2100  ceFAZolin (ANCEF) IVPB 1 g/50 mL premix     1 g 100 mL/hr over 30 Minutes Intravenous  Once 09/01/13 2052 09/01/13 2314      PHYSICAL EXAM: Vital signs in last 24  hours: Filed Vitals:   09/02/13 0500 09/02/13 1335 09/02/13 2100 09/03/13 0500  BP: 136/74 131/70 116/63 109/61  Pulse: 107 100 101 92  Temp: 98.5 F (36.9 C) 98.2 F (36.8 C) 100.1 F (37.8 C) 99.1 F (37.3 C)  TempSrc:  Oral    Resp: 20 21 18 18   Height:      Weight:      SpO2: 100% 97% 98% 98%    Weight change:  Filed Weights   09/01/13 2100 09/01/13 2209 09/02/13 0349  Weight: 65.318 kg (144 lb)  65.318 kg (144 lb) 67.132 kg (148 lb)   Body mass index is 26.22 kg/(m^2).   Gen Exam: Awake- not in any distress. Neck: Supple, No JVD.   Chest: B/L Clear.   CVS: S1 S2 Regular, no murmurs.  Abdomen: soft, BS +, non tender, non distended.  Extremities: no edema,purulent drainage from plantar, lateral foot, crepitus felt in dorsum of foot, foul smelling odor. Black appearing toe in right foot-but foot is warm Neurologic: Non Focal.   Skin: No Rash.   Wounds: N/A.    Intake/Output from previous day:  Intake/Output Summary (Last 24 hours) at 09/03/13 1023 Last data filed at 09/03/13 0500  Gross per 24 hour  Intake 758.13 ml  Output    800 ml  Net -41.87 ml     LAB RESULTS: CBC  Recent Labs Lab 09/01/13 2015 09/02/13 0625 09/03/13 0300  WBC 26.8* 23.3* 22.3*  HGB 15.0 12.3* 12.3*  HCT 40.9 33.4* 32.6*  PLT 309 256 245  MCV 85.6 83.1 81.1  MCH 31.4 30.6 30.6  MCHC 36.7* 36.8* 37.7*  RDW 13.5 13.3 13.1    Chemistries   Recent Labs Lab 09/01/13 2200 09/02/13 0625  09/02/13 1340 09/02/13 1840 09/02/13 2138 09/03/13 0300 09/03/13 0640  NA 133* 134*  < > 139 138 139 138 140  K 3.8 3.0*  < > 2.9* 3.5* 3.3* 3.1* 3.3*  CL 95* 99  < > 105 105 106 105 107  CO2 9* 13*  < > 16* 15* 14* 15* 16*  GLUCOSE 594* 309*  < > 171* 125* 130* 156* 135*  BUN 28* 25*  < > 20 18 18 18 16   CREATININE 0.89 0.83  < > 0.84 0.71 0.79 0.91 0.92  CALCIUM 9.5 9.0  < > 8.7 8.5 8.5 8.4 8.4  MG  --  2.5  --   --   --   --   --   --   < > = values in this interval not displayed.  CBG:  Recent Labs Lab 09/03/13 0544 09/03/13 0647 09/03/13 0749 09/03/13 0902 09/03/13 1009  GLUCAP 151* 125* 136* 151* 175*    GFR Estimated Creatinine Clearance: 73 ml/min (by C-G formula based on Cr of 0.92).  Coagulation profile No results found for this basename: INR, PROTIME,  in the last 168 hours  Cardiac Enzymes No results found for this basename: CK, CKMB, TROPONINI, MYOGLOBIN,  in the  last 168 hours  No components found with this basename: POCBNP,  No results found for this basename: DDIMER,  in the last 72 hours  Recent Labs  09/02/13 1010  HGBA1C 12.2*   No results found for this basename: CHOL, HDL, LDLCALC, TRIG, CHOLHDL, LDLDIRECT,  in the last 72 hours  Recent Labs  09/02/13 0625  TSH 1.353   No results found for this basename: VITAMINB12, FOLATE, FERRITIN, TIBC, IRON, RETICCTPCT,  in the last 72 hours No results found for  this basename: LIPASE, AMYLASE,  in the last 72 hours  Urine Studies No results found for this basename: UACOL, UAPR, USPG, UPH, UTP, UGL, UKET, UBIL, UHGB, UNIT, UROB, ULEU, UEPI, UWBC, URBC, UBAC, CAST, CRYS, UCOM, BILUA,  in the last 72 hours  MICROBIOLOGY: Recent Results (from the past 240 hour(s))  WOUND CULTURE     Status: None   Collection Time    09/01/13  8:18 PM      Result Value Range Status   Specimen Description WOUND LEFT FOOT   Final   Special Requests NONE   Final   Gram Stain     Final   Value: NO WBC SEEN     NO SQUAMOUS EPITHELIAL CELLS SEEN     MODERATE GRAM POSITIVE COCCI IN PAIRS     MODERATE GRAM NEGATIVE RODS     Performed at [facility]   Culture PENDING   Incomplete   Report Status PENDING   Incomplete    RADIOLOGY STUDIES/RESULTS: Dg Foot Complete Left  09/01/2013   CLINICAL DATA:  Skin ulcerations both feet.  EXAM: LEFT FOOT - COMPLETE 3+ VIEW  COMPARISON:  None.  FINDINGS: Extensive gas is seen tracking in dorsal and plantar soft tissues of the left foot. Although diffuse, the largest collection of gas is seen about the third, fourth and fifth metatarsals. Soft tissues of the foot are swollen. No radiopaque foreign body is identified. No bony destructive change is seen. 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 Well corticated defect in the head of the middle phalanx of the second toe is identified and may be postoperative.   IMPRESSION: Extensive soft tissue gas in the right foot is consistent with cellulitis and abscess formation. Necrotizing fasciitis can cause this appearance. No plain film evidence of osteomyelitis is identified.   Electronically Signed   By: Inge Rise M.D.   On: 09/01/2013 21:20   Dg Foot Complete Right  09/01/2013   ADDENDUM REPORT: 09/01/2013 21:57  ADDENDUM: This addendum is given for the purpose of noting that refreence to the right foot in the impression is incorrect. The report is for the left foot.   Electronically Signed   By: Inge Rise M.D.   On: 09/01/2013 21:57   09/01/2013   CLINICAL DATA:  Ulcers on both feet.  EXAM: RIGHT FOOT COMPLETE - 3+ VIEW  COMPARISON:  None.  FINDINGS: No bony destructive change, radiopaque foreign body or soft tissue gas collection is identified. There appear to be skin ulcerations in the lateral margin of the foot. There is no fracture or dislocation. Prominent calcaneal spurs are noted.  IMPRESSION: Skin ulcerations without acute bony or joint abnormality.  Electronically Signed: By: Inge Rise M.D. On: 09/01/2013 21:15    Oren Binet, MD  [facility] Pager:[phone]  If 7PM-7AM, please contact night-coverage [email or web address] Password [email or web address] 09/03/2013, 10:23 AM   LOS: 2 days

## 2013-09-03 NOTE — Transfer of Care (Signed)
Immediate Anesthesia Transfer of Care Note  Patient: [name]  Procedure(s) Performed: Procedure(s): IRRIGATION AND DEBRIDEMENT EXTREMITY (Left)  Patient Location: PACU  Anesthesia Type:General  Level of Consciousness: awake, alert  and oriented  Airway & Oxygen Therapy: Patient Spontanous Breathing  Post-op Assessment: Report given to PACU RN  Post vital signs: Reviewed and stable  Complications: No apparent anesthesia complications

## 2013-09-04 DIAGNOSIS — L089 Local infection of the skin and subcutaneous tissue, unspecified: Secondary | ICD-10-CM

## 2013-09-04 DIAGNOSIS — I96 Gangrene, not elsewhere classified: Secondary | ICD-10-CM

## 2013-09-04 DIAGNOSIS — R0989 Other specified symptoms and signs involving the circulatory and respiratory systems: Secondary | ICD-10-CM

## 2013-09-04 DIAGNOSIS — E1110 Type 2 diabetes mellitus with ketoacidosis without coma: Secondary | ICD-10-CM | POA: Diagnosis present

## 2013-09-04 LAB — GLUCOSE, CAPILLARY
GLUCOSE-CAPILLARY: 145 mg/dL — AB (ref 70–99)
GLUCOSE-CAPILLARY: 153 mg/dL — AB (ref 70–99)
GLUCOSE-CAPILLARY: 167 mg/dL — AB (ref 70–99)
GLUCOSE-CAPILLARY: 145 mg/dL — AB (ref 70–99)
GLUCOSE-CAPILLARY: 160 mg/dL — AB (ref 70–99)
GLUCOSE-CAPILLARY: 151 mg/dL — AB (ref 70–99)
Glucose-Capillary: 129 mg/dL — ABNORMAL HIGH (ref 70–99)
Glucose-Capillary: 131 mg/dL — ABNORMAL HIGH (ref 70–99)
Glucose-Capillary: 133 mg/dL — ABNORMAL HIGH (ref 70–99)
Glucose-Capillary: 134 mg/dL — ABNORMAL HIGH (ref 70–99)
Glucose-Capillary: 135 mg/dL — ABNORMAL HIGH (ref 70–99)
Glucose-Capillary: 140 mg/dL — ABNORMAL HIGH (ref 70–99)
Glucose-Capillary: 141 mg/dL — ABNORMAL HIGH (ref 70–99)
Glucose-Capillary: 147 mg/dL — ABNORMAL HIGH (ref 70–99)
Glucose-Capillary: 148 mg/dL — ABNORMAL HIGH (ref 70–99)
Glucose-Capillary: 149 mg/dL — ABNORMAL HIGH (ref 70–99)
Glucose-Capillary: 156 mg/dL — ABNORMAL HIGH (ref 70–99)
Glucose-Capillary: 168 mg/dL — ABNORMAL HIGH (ref 70–99)
Glucose-Capillary: 171 mg/dL — ABNORMAL HIGH (ref 70–99)
Glucose-Capillary: 181 mg/dL — ABNORMAL HIGH (ref 70–99)
Glucose-Capillary: 182 mg/dL — ABNORMAL HIGH (ref 70–99)
Glucose-Capillary: 184 mg/dL — ABNORMAL HIGH (ref 70–99)
Glucose-Capillary: 186 mg/dL — ABNORMAL HIGH (ref 70–99)
Glucose-Capillary: 192 mg/dL — ABNORMAL HIGH (ref 70–99)

## 2013-09-04 LAB — BASIC METABOLIC PANEL
BUN: 13 mg/dL (ref 6–23)
BUN: 12 mg/dL (ref 6–23)
BUN: 10 mg/dL (ref 6–23)
BUN: 9 mg/dL (ref 6–23)
BUN: 7 mg/dL (ref 6–23)
BUN: 8 mg/dL (ref 6–23)
CHLORIDE: 106 meq/L (ref 96–112)
CHLORIDE: 108 meq/L (ref 96–112)
CHLORIDE: 107 meq/L (ref 96–112)
CHLORIDE: 108 meq/L (ref 96–112)
CO2: 16 meq/L — AB (ref 19–32)
CO2: 17 meq/L — AB (ref 19–32)
CO2: 17 mEq/L — ABNORMAL LOW (ref 19–32)
CO2: 18 mEq/L — ABNORMAL LOW (ref 19–32)
CO2: 18 mEq/L — ABNORMAL LOW (ref 19–32)
CO2: 19 meq/L (ref 19–32)
CREATININE: 0.98 mg/dL (ref 0.50–1.35)
Calcium: 7.6 mg/dL — ABNORMAL LOW (ref 8.4–10.5)
Calcium: 7.5 mg/dL — ABNORMAL LOW (ref 8.4–10.5)
Calcium: 7.7 mg/dL — ABNORMAL LOW (ref 8.4–10.5)
Calcium: 7.5 mg/dL — ABNORMAL LOW (ref 8.4–10.5)
Calcium: 7.5 mg/dL — ABNORMAL LOW (ref 8.4–10.5)
Calcium: 7.3 mg/dL — ABNORMAL LOW (ref 8.4–10.5)
Chloride: 111 mEq/L (ref 96–112)
Chloride: 111 mEq/L (ref 96–112)
Creatinine, Ser: 0.78 mg/dL (ref 0.50–1.35)
Creatinine, Ser: 0.89 mg/dL (ref 0.50–1.35)
Creatinine, Ser: 0.81 mg/dL (ref 0.50–1.35)
Creatinine, Ser: 0.81 mg/dL (ref 0.50–1.35)
Creatinine, Ser: 0.86 mg/dL (ref 0.50–1.35)
GFR calc Af Amer: >90 mL/min (ref >90)
GFR calc Af Amer: >90 mL/min (ref >90)
GFR calc Af Amer: >90 mL/min (ref >90)
GFR calc Af Amer: >90 mL/min (ref >90)
GFR calc non Af Amer: >90 mL/min (ref >90)
GFR calc non Af Amer: >90 mL/min (ref >90)
GFR calc non Af Amer: >90 mL/min (ref >90)
GFR calc non Af Amer: >90 mL/min (ref >90)
GLUCOSE: 117 mg/dL — AB (ref 70–99)
GLUCOSE: 151 mg/dL — AB (ref 70–99)
GLUCOSE: 144 mg/dL — AB (ref 70–99)
Glucose, Bld: 168 mg/dL — ABNORMAL HIGH (ref 70–99)
Glucose, Bld: 136 mg/dL — ABNORMAL HIGH (ref 70–99)
Glucose, Bld: 119 mg/dL — ABNORMAL HIGH (ref 70–99)
POTASSIUM: 3.3 meq/L — AB (ref 3.7–5.3)
POTASSIUM: 3.5 meq/L — AB (ref 3.7–5.3)
POTASSIUM: 3.1 meq/L — AB (ref 3.7–5.3)
POTASSIUM: 3.3 meq/L — AB (ref 3.7–5.3)
Potassium: 3.1 mEq/L — ABNORMAL LOW (ref 3.7–5.3)
Potassium: 3.2 mEq/L — ABNORMAL LOW (ref 3.7–5.3)
SODIUM: 136 meq/L — AB (ref 137–147)
SODIUM: 142 meq/L (ref 137–147)
SODIUM: 141 meq/L (ref 137–147)
SODIUM: 141 meq/L (ref 137–147)
Sodium: 142 mEq/L (ref 137–147)
Sodium: 139 mEq/L (ref 137–147)

## 2013-09-04 MED ORDER — GLUCERNA SHAKE PO LIQD
237.0000 mL | Freq: Two times a day (BID) | ORAL | Status: DC
  Administered 2013-09-04 – 2013-09-13 (×8): 237 mL via ORAL

## 2013-09-04 NOTE — Progress Notes (Signed)
PATIENT DETAILS Name: [name] Age: 58 y.o. Sex: male Date of Birth: 05-Oct-1955 Admit Date: 09/01/2013 Admitting Physician Toy Baker, MD PCP:No PCP Per Patient  Subjective: No major complaints-essentially unchanged  Assessment/Plan: Suspected necrotizing infection of the left foot - Patient presented with a foul-smelling ulceration of the left foot, x-ray on admission showed gas in the soft tissue. Patient had significant leukocytosis. Patient was subsequently admitted, orthopedics was consulted. - Currently on vancomycin, Zosyn and clindamycin-day 3 -  MRI left foot confirms necrotizing infection, followed by ORtho,  On 2/3 Underwent :  1. Debridement of bone, muscle, skin, soft tissue. CPT 11042  2. Fifth ray amputation.  3. 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  4. Application of wound vac -plan for BKA next week per Ortho   Black appearing toe-right foot -FU ABI - need to watch the right foot as well  Diabetic ketoacidosis - Currently on an insulin infusion, day 2 due to severe foot infection -bmet q4, IVF - transition to subcutaneous insulin once anion gap has closed. - New diagnosis of diabetes this admission, A1c 12.2 -will require Insulin on discharge -DM coordinator consult appreciated  Hypokalemia - Continue to replace potassium. - Bmet every 4 hours for now till gap corrected  Hypophosphatemia - Secondary to poor nutritional status, insulin infusion. - Continue to supplement potassium, watch for refeeding syndrome -recheck PO4 in am  DVT Prophylaxis:  SCD's  Code Status:Full code  Family Communication:Sister at bedside Disposition:Remain inpatient  Procedures: 2/3 by dr.Xu 1. Debridement of bone, muscle, skin, soft tissue. CPT 11042  2. Fifth ray amputation.  3. 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  4. Application of negative pressure wound therapy greater   CONSULTS:  orthopedic surgery  Time spent 36min   MEDICATIONS: Scheduled Meds: . clindamycin (CLEOCIN) IV  600 mg Intravenous Q8H  . feeding supplement (GLUCERNA SHAKE)  237 mL Oral BID BM  . insulin regular  0-10 Units Intravenous TID WC  . piperacillin-tazobactam (ZOSYN)  IV  3.375 g Intravenous Q8H  . sodium chloride  3 mL Intravenous Q12H  . vancomycin  1,000 mg Intravenous Q12H   Continuous Infusions: . dextrose 5 % and 0.9% NaCl 1,000 mL with potassium chloride 20 mEq infusion 125 mL/hr at 09/04/13 0556  . insulin (NOVOLIN-R) infusion 8.1 Units/hr (09/04/13 1330)  . lactated ringers 20 mL/hr at 09/03/13 1110   PRN Meds:.acetaminophen, dextrose, diphenhydrAMINE, HYDROcodone-acetaminophen, morphine injection, morphine injection, ondansetron (ZOFRAN) IV, ondansetron, oxyCODONE  Antibiotics: Anti-infectives   Start     Dose/Rate Route Frequency Ordered Stop   09/02/13 1100  vancomycin (VANCOCIN) IVPB 1000 mg/200 mL premix     1,000 mg 200 mL/hr over 60 Minutes Intravenous Every 12 hours 09/02/13 0938     09/02/13 0800  clindamycin (CLEOCIN) IVPB 600 mg     600 mg 100 mL/hr over 30 Minutes Intravenous 3 times per day 09/02/13 0714     09/02/13 0600  piperacillin-tazobactam (ZOSYN) IVPB 3.375 g  Status:  Discontinued     3.375 g 100 mL/hr over 30 Minutes Intravenous 3 times per day 09/02/13 0351 09/02/13 0352   09/02/13 0600  piperacillin-tazobactam (ZOSYN) IVPB 3.375 g     3.375 g 12.5 mL/hr over 240 Minutes Intravenous Every 8 hours 09/02/13 0353     09/01/13 2315  vancomycin (VANCOCIN) IVPB 1000 mg/200 mL premix     1,000 mg 200 mL/hr over 60 Minutes Intravenous  Once  09/01/13 2311 09/02/13 0053   09/01/13 2315  piperacillin-tazobactam (ZOSYN) IVPB 3.375 g     3.375 g 100 mL/hr over 30 Minutes Intravenous  Once 09/01/13 2311 09/01/13 2351   09/01/13 2100  ceFAZolin (ANCEF) IVPB 1 g/50 mL premix     1 g 100 mL/hr over 30 Minutes Intravenous  Once 09/01/13 2052 09/01/13 2314       PHYSICAL EXAM: Vital signs in last 24 hours: Filed Vitals:   09/04/13 0000 09/04/13 0400 09/04/13 0802 09/04/13 1206  BP: 109/55 127/62 125/64 138/66  Pulse: 67 72 90 94  Temp: 98.1 F (36.7 C) 98.1 F (36.7 C) 99.6 F (37.6 C) 100 F (37.8 C)  TempSrc:   Oral Oral  Resp: 18 18 18 18   Height:      Weight:      SpO2: 99% 99% 98% 96%    Weight change:  Filed Weights   09/01/13 2100 09/01/13 2209 09/02/13 0349  Weight: 65.318 kg (144 lb) 65.318 kg (144 lb) 67.132 kg (148 lb)   Body mass index is 26.22 kg/(m^2).   Gen Exam: Awake, AAOx3, unkempt, no distress, flat affect Neck: Supple, No JVD.   Chest: B/L Clear.   CVS: S1 S2 Regular, no murmurs.  Abdomen: soft, BS +, non tender, non distended.  Extremities: no edema, large wound with vac  Black appearing toe in right foot-but foot is warm Neurologic: Non Focal.   Skin: No Rash.   Wounds: N/A.    Intake/Output from previous day:  Intake/Output Summary (Last 24 hours) at 09/04/13 1355 Last data filed at 09/04/13 0900  Gross per 24 hour  Intake 6465.3 ml  Output    475 ml  Net 5990.3 ml     LAB RESULTS: CBC  Recent Labs Lab 09/01/13 2015 09/02/13 0625 09/03/13 0300  WBC 26.8* 23.3* 22.3*  HGB 15.0 12.3* 12.3*  HCT 40.9 33.4* 32.6*  PLT 309 256 245  MCV 85.6 83.1 81.1  MCH 31.4 30.6 30.6  MCHC 36.7* 36.8* 37.7*  RDW 13.5 13.3 13.1    Chemistries   Recent Labs Lab 09/01/13 2200 09/02/13 0625  09/03/13 1850 09/03/13 2200 09/04/13 0115 09/04/13 0348 09/04/13 0950  NA 133* 134*  < > 138 137 136* 142 142  K 3.8 3.0*  < > 3.7 3.2* 3.1* 3.3* 3.5*  CL 95* 99  < > 108 107 106 111 111  CO2 9* 13*  < > 15* 16* 16* 17* 17*  GLUCOSE 594* 309*  < > 210* 186* 168* 117* 151*  BUN 28* 25*  < > 16 14 13 12 10   CREATININE 0.89 0.83  < > 0.92 0.77 0.78 0.89 0.81  CALCIUM 9.5 9.0  < > 7.8* 7.8* 7.6* 7.5* 7.7*  MG  --  2.5  --   --   --   --   --   --   < > = values in this interval not  displayed.  CBG:  Recent Labs Lab 09/04/13 0931 09/04/13 1012 09/04/13 1117 09/04/13 1224 09/04/13 1331  GLUCAP 156* 148* 192* 181* 141*    GFR Estimated Creatinine Clearance: 82.9 ml/min (by C-G formula based on Cr of 0.81).  Coagulation profile No results found for this basename: INR, PROTIME,  in the last 168 hours  Cardiac Enzymes No results found for this basename: CK, CKMB, TROPONINI, MYOGLOBIN,  in the last 168 hours  No components found with this basename: POCBNP,  No results found for this basename: DDIMER,  in the last 72 hours  Recent Labs  09/02/13 1010  HGBA1C 12.2*   No results found for this basename: CHOL, HDL, LDLCALC, TRIG, CHOLHDL, LDLDIRECT,  in the last 72 hours  Recent Labs  09/02/13 0625  TSH 1.353   No results found for this basename: VITAMINB12, FOLATE, FERRITIN, TIBC, IRON, RETICCTPCT,  in the last 72 hours No results found for this basename: LIPASE, AMYLASE,  in the last 72 hours  Urine Studies No results found for this basename: UACOL, UAPR, USPG, UPH, UTP, UGL, UKET, UBIL, UHGB, UNIT, UROB, ULEU, UEPI, UWBC, URBC, UBAC, CAST, CRYS, UCOM, BILUA,  in the last 72 hours  MICROBIOLOGY: Recent Results (from the past 240 hour(s))  WOUND CULTURE     Status: None   Collection Time    09/01/13  8:18 PM      Result Value Range Status   Specimen Description WOUND LEFT FOOT   Final   Special Requests NONE   Final   Gram Stain     Final   Value: NO WBC SEEN     NO SQUAMOUS EPITHELIAL CELLS SEEN     MODERATE GRAM POSITIVE COCCI IN PAIRS     MODERATE GRAM NEGATIVE RODS     Performed at [facility]   Culture     Final   Value: FEW PROTEUS MIRABILIS     Performed at [facility]   Report Status PENDING   Incomplete   Organism ID, Bacteria PROTEUS MIRABILIS   Final  ANAEROBIC CULTURE     Status: None   Collection Time    09/03/13 12:03 PM      Result Value Range Status   Specimen Description ABSCESS FOOT LEFT   Final    Special Requests PT ON VANCO ZOSYN   Final   Gram Stain     Final   Value: NO WBC SEEN     NO SQUAMOUS EPITHELIAL CELLS SEEN     MODERATE GRAM POSITIVE COCCI IN PAIRS     Performed at [facility]   Culture     Final   Value: NO ANAEROBES ISOLATED; CULTURE IN PROGRESS FOR 5 DAYS     Performed at [facility]   Report Status PENDING   Incomplete  CULTURE, ROUTINE-ABSCESS     Status: None   Collection Time    09/03/13 12:03 PM      Result Value Range Status   Specimen Description ABSCESS FOOT LEFT   Final   Special Requests PT ON VANCO ZOSYN   Final   Gram Stain     Final   Value: NO WBC SEEN     NO SQUAMOUS EPITHELIAL CELLS SEEN     MODERATE GRAM POSITIVE COCCI IN PAIRS     Performed at [facility]   Culture     Final   Value: Culture reincubated for better growth     Performed at [facility]   Report Status PENDING   Incomplete    RADIOLOGY STUDIES/RESULTS: Dg Foot Complete Left  09/01/2013   CLINICAL DATA:  Skin ulcerations both feet.  EXAM: LEFT FOOT - COMPLETE 3+ VIEW  COMPARISON:  None.  FINDINGS: Extensive gas is seen tracking in dorsal and plantar soft tissues of the left foot. Although diffuse, the largest collection of gas is seen about the third, fourth and fifth metatarsals. Soft tissues of the foot are swollen. No radiopaque foreign body is identified. No bony destructive change is seen. The  patient appears to be status post amputation of the distal phalanx at the level of the great toe. Well corticated defect in the head of the middle phalanx of the second toe is identified and may be postoperative.  IMPRESSION: Extensive soft tissue gas in the right foot is consistent with cellulitis and abscess formation. Necrotizing fasciitis can cause this appearance. No plain film evidence of osteomyelitis is identified.   Electronically Signed   By: Inge Rise M.D.   On: 09/01/2013 21:20   Dg Foot Complete Right  09/01/2013   ADDENDUM REPORT:  09/01/2013 21:57  ADDENDUM: This addendum is given for the purpose of noting that refreence to the right foot in the impression is incorrect. The report is for the left foot.   Electronically Signed   By: Inge Rise M.D.   On: 09/01/2013 21:57   09/01/2013   CLINICAL DATA:  Ulcers on both feet.  EXAM: RIGHT FOOT COMPLETE - 3+ VIEW  COMPARISON:  None.  FINDINGS: No bony destructive change, radiopaque foreign body or soft tissue gas collection is identified. There appear to be skin ulcerations in the lateral margin of the foot. There is no fracture or dislocation. Prominent calcaneal spurs are noted.  IMPRESSION: Skin ulcerations without acute bony or joint abnormality.  Electronically Signed: By: Inge Rise M.D. On: 09/01/2013 21:15    Domenic Polite, MD  [facility] Pager:[phone]  If 7PM-7AM, please contact night-coverage [email or web address] Password [email or web address] 09/04/2013, 1:55 PM   LOS: 3 days

## 2013-09-04 NOTE — Progress Notes (Signed)
Occupational Therapy Evaluation Patient Details Name: [name] MRN: [id number] DOB: Feb 13, 1956 Today's Date: 09/04/2013 Time:  -     OT Assessment / Plan / Recommendation History of present illness  Patient is a 58 y.o. male who was adm to the hospital and states that he had foot swelling since last week. Patient became incontinent of urine, feeling lightheaded, blurred vision, patient states that he started to loose balance as well. Denies any significant chest pain. Patient states that his feet are numb since last year. Patient has been wearing same socks and tenniss shoes for past 3 months at least. Patient was found to have ulcerations of left foot. 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    Clinical Impression   Pt presents with significant deficits.Pt states he did not check his feet regularly and never know that he was a diabetic. Began education on compensatory techniques/AE for  BUE strengthening program. Pt given theraband to increase BUE strength. Given written exercises to increase strength and endurance for physical motor skills needed for D/C.Discussed need to complete HEP on his own. Also discussed possible CIR. Pt very interested and feel CIR will be appropriate to facilitate D/C home. Pending on R foot, pt may need to go home @ w/c level.  OT Assessment       Follow Up Recommendations    CIR   Barriers to Discharge    unsure of support; sister works during the day  Equipment Recommendations       Recommendations for Other Services    Frequency    2x/wk   Precautions / Restrictions Precautions Precautions: Fall Precaution Comments: wound VAC Restrictions Weight Bearing Restrictions: Yes LLE Weight Bearing: Non weight bearing   Pertinent Vitals/Pain no apparent distress     ADL  Eating/Feeding: Supervision/safety Where Assessed - Eating/Feeding: Chair Grooming: Minimal assistance;Simulated;Set up;Supervision/safety;Min guard Where  Assessed - Grooming: Supported sitting Upper Body Bathing: Minimal assistance Where Assessed - Upper Body Bathing: Supported sitting Lower Body Bathing: Moderate assistance Where Assessed - Lower Body Bathing: Supported sit to stand Upper Body Dressing: Minimal assistance Where Assessed - Upper Body Dressing: Supported sitting Lower Body Dressing: Maximal assistance Where Assessed - Lower Body Dressing: Supported sit to Tree surgeon Transfer: +2 Total assistance;Moderate assistance Toilet Transfer Method: Sit to stand Toileting - Clothing Manipulation and Hygiene: Moderate assistance Where Assessed - Toileting Clothing Manipulation and Hygiene: Lean right and/or left Transfers/Ambulation Related to ADLs: +2 ADL Comments: decline in function    OT Diagnosis:    OT Problem List:   OT Treatment Interventions:     OT Goals(Current goals can be found in the care plan section) Acute Rehab OT Goals Patient Stated Goal: to have surgery then get better  Visit Information  Last OT Received On: 09/04/13 Assistance Needed: +2 History of Present Illness:  Patient is a 58 y.o. male who was adm to the hospital and states that he had foot swelling since last week. Patient became incontinent of urine, feeling lightheaded, blurred vision, patient states that he started to loose balance as well. Denies any significant chest pain. Patient states that his feet are numb since last year. Patient has been wearing same socks and tenniss shoes for past 3 months at least. Patient was found to have ulcerations of left foot. 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        Prior Functioning     Home Living Family/patient expects  to be discharged to:: Private residence Living Arrangements: Other (Comment) (Sister) Available Help at Discharge: Family;Available 24 hours/day Type of Home: House Home Access: Stairs to enter [employer] of Steps: 2-3 Entrance Stairs-Rails:  None Home Layout: One level Home Equipment: None Prior Function Level of Independence: Independent Communication Communication: No difficulties         Vision/Perception Vision - History Baseline Vision: Wears glasses all the time Patient Visual Report: Blurring of vision Vision - Assessment Additional Comments: rec follow up wit eye doctor Perception Perception: Within Functional Limits   Cognition  Cognition Arousal/Alertness: Awake/alert Behavior During Therapy: Impulsive Overall Cognitive Status: No family/caregiver present to determine baseline cognitive functioning Awareness: Emergent Problem Solving: Slow processing General Comments: unsure of baseline cognitive functional; states he did not have problems with his feet until he was admitted to the hospital    Extremity/Trunk Assessment Upper Extremity Assessment Upper Extremity Assessment: Generalized weakness Lower Extremity Assessment Lower Extremity Assessment: Generalized weakness;Defer to PT evaluation;RLE deficits/detail;LLE deficits/detail RLE Sensation: decreased light touch (blackened area on all toes) LLE: Unable to fully assess due to pain LLE Sensation: history of peripheral neuropathy Cervical / Trunk Assessment Cervical / Trunk Assessment: Kyphotic     Mobility Bed Mobility Overal bed mobility: Needs Assistance Bed Mobility: Supine to Sit Supine to sit: Supervision General bed mobility comments: HOB elevated and pt relied on handrails; min cues for management of lines Transfers Overall transfer level: Needs assistance Equipment used: 2 person hand held assist Sit to Stand: +2 safety/equipment;Min assist Stand pivot transfers: +2 safety/equipment;Mod assist General transfer comment: cues to maintain NWB status on Lt LE; 2 person (A) for safety; pt tends to get anxious with transfers; cues for hand placement and sequencing      Exercise General Exercises - Lower Extremity Ankle Circles/Pumps:  Both;10 reps;Right   Balance Balance Overall balance assessment: Needs assistance Sitting balance-Leahy Scale: Good Standing balance-Leahy Scale: Poor   End of Session    GO     Dashay Giesler,HILLARY 09/04/2013, 10:01 PM [facility], OTR/L  [phone] 09/04/2013

## 2013-09-04 NOTE — Progress Notes (Signed)
VASCULAR LAB PRELIMINARY  ARTERIAL  ABI completed:    RIGHT    LEFT    PRESSURE WAVEFORM  PRESSURE WAVEFORM  BRACHIAL 127  Triphasic BRACHIAL 139 Triphasic  DP 146 Triphasic DP WOUND VAC   AT   AT 157 Triphasic  PT 151 Triphasic PT 141 Triphasic    RIGHT LEFT  ABI 1.09 1.13   ABIs and Doppler waveforms are within normal limits bilaterally at rest.  Alandis Bluemel, RVS 09/04/2013, 11:23 AM

## 2013-09-04 NOTE — Progress Notes (Signed)
Recommend orders for basal & correction insulin to be in place before patient is taken off glucostabilizer.  Be sure the basal insulin is given 2 hours before stopping drip and the Novolog correction given as soon as insulin drip stopped.  May need to add meal coverage if patient eating at least 50% of meals. Will continue to follow while in hospital.  Harvel Ricks RN BSN CDE

## 2013-09-04 NOTE — Progress Notes (Signed)
Pt temperature at beginning of pm shift on 2/3 was 102.6.  Pt reported feeling normal.  MD notified. Acetaminophen and blood cultures ordered.  Acetaminophen given and pt temperature now 98.1.  Blood cultures drawn by lab.  Will continue to monitor.

## 2013-09-04 NOTE — Progress Notes (Signed)
NUTRITION FOLLOW UP  Intervention:    Continue CHO modified diet.  Glucerna Shake PO BID, each supplement provides 220 kcal and 10 grams of protein.  Diet education provided.  Nutrition Dx:   Increased nutrient needs related to infected foot wound as evidenced by estimated nutrition needs. Ongoing.  Goal:   Intake to meet >90% of estimated nutrition needs to support wound healing. Progressing.  Monitor:   PO intake, labs, weight trend, wound healing.  Assessment:   Patient with history of hepatitis; presented to the hospital on 2/1 with foot swelling since last week. Patient became incontinent of urine, feeling lightheaded, blurred vision, patient states that he started to loose balance as well. Patient states that his feet are numb since last year. Patient has been wearing same socks and tenniss shoes for past 3 months at least. X-ray on admission showed gas in the soft tissue.   S/P debridement of bone, muscle, and soft tissue with fifth ray amputation and amputation of fourth toe through the metatarsal phalangeal joint on 2/3. Wound VAC was also placed.  Discussed with patient his increased requirement for protein to support wound healing. Gave examples of ways to increase protein intake. Also provided diabetes diet handout. Patient was overwhelmed with all the information he's been receiving regarding his diabetes. He wants to review the information I provided at a later time on his own. Patient reports that he consumed 100% of his breakfast. Agreed to Glucerna Shakes BID between meals to increase protein intake.  Height: Ht Readings from Last 1 Encounters:  09/02/13 5\' 3"  (1.6 m)    Weight Status:   Wt Readings from Last 1 Encounters:  09/02/13 148 lb (67.132 kg)    Re-estimated needs:  Kcal: 2000  Protein: 95-110 gm Fluid: 2L  Skin: Left foot infection and abscess concerning for necrotizing fasciitis     Diet Order: Carb Control   Intake/Output Summary (Last 24  hours) at 09/04/13 1206 Last data filed at 09/04/13 0900  Gross per 24 hour  Intake 6465.3 ml  Output    475 ml  Net 5990.3 ml    Last BM: 1/28   Labs:   Recent Labs Lab 09/01/13 2200 09/02/13 0625  09/02/13 1340 09/02/13 1840 09/02/13 2138  09/04/13 0115 09/04/13 0348 09/04/13 0950  NA 133* 134*  < > 139 138 139  < > 136* 142 142  K 3.8 3.0*  < > 2.9* 3.5* 3.3*  < > 3.1* 3.3* 3.5*  CL 95* 99  < > 105 105 106  < > 106 111 111  CO2 9* 13*  < > 16* 15* 14*  < > 16* 17* 17*  BUN 28* 25*  < > 20 18 18   < > 13 12 10   CREATININE 0.89 0.83  < > 0.84 0.71 0.79  < > 0.78 0.89 0.81  CALCIUM 9.5 9.0  < > 8.7 8.5 8.5  < > 7.6* 7.5* 7.7*  MG  --  2.5  --   --   --   --   --   --   --   --   PHOS  --  0.6*  < > 1.3* 1.9* 2.3  --   --   --   --   GLUCOSE 594* 309*  < > 171* 125* 130*  < > 168* 117* 151*  < > = values in this interval not displayed.  CBG (last 3)   Recent Labs  09/04/13 0931 09/04/13 1012 09/04/13 1117  GLUCAP 156* 148* 192*    Scheduled Meds: . clindamycin (CLEOCIN) IV  600 mg Intravenous Q8H  . insulin regular  0-10 Units Intravenous TID WC  . piperacillin-tazobactam (ZOSYN)  IV  3.375 g Intravenous Q8H  . sodium chloride  3 mL Intravenous Q12H  . vancomycin  1,000 mg Intravenous Q12H    Continuous Infusions: . dextrose 5 % and 0.9% NaCl 1,000 mL with potassium chloride 20 mEq infusion 125 mL/hr at 09/04/13 0556  . insulin (NOVOLIN-R) infusion 11.9 Units/hr (09/04/13 1117)  . lactated ringers 20 mL/hr at 09/03/13 [place], RD, LDN, [place] Pager [phone] After Hours Pager [phone]

## 2013-09-04 NOTE — Progress Notes (Signed)
Subjective:  Patient reports pain is slight better. Fever to 102 o/n.  Objective:   VITALS:   Filed Vitals:   09/03/13 2000 09/04/13 0000 09/04/13 0400 09/04/13 0802  BP: 117/59 109/55 127/62 125/64  Pulse: 73 67 72 90  Temp: 102.6 F (39.2 C) 98.1 F (36.7 C) 98.1 F (36.7 C) 99.6 F (37.6 C)  TempSrc: Oral   Oral  Resp: 20 18 18 18   Height:      Weight:      SpO2: 99% 99% 99% 98%    LLE: - VAC with good seal and suction - no signs of worsening cellulitis  RLE: - toes are discolored concerning for necrosis - no fluctuance, frank pus, induration   Lab Results  Component Value Date   WBC 22.3* 09/03/2013   HGB 12.3* 09/03/2013   HCT 32.6* 09/03/2013   MCV 81.1 09/03/2013   PLT 245 09/03/2013     Assessment/Plan: 1 Day Post-Op   Problem List Items Addressed This Visit     Endocrine   DM (diabetes mellitus) type II controlled, neurological manifestation   Relevant Medications      insulin regular bolus via infusion 0-10 Units     Other   Abscess of foot    Other Visit Diagnoses   Left foot infection    -  Primary    Relevant Medications       ceFAZolin (ANCEF) IVPB 1 g/50 mL premix (Completed)       vancomycin (VANCOCIN) IVPB 1000 mg/200 mL premix (Completed)       clindamycin (CLEOCIN) IVPB 600 mg       vancomycin (VANCOCIN) IVPB 1000 mg/200 mL premix    Right foot infection        DKA (diabetic ketoacidoses)        Hyponatremia        Hypochloremia        Diabetes mellitus type 2, uncontrolled, with complications        Necrotizing fasciitis        Hypokalemia        Hypophosphatemia          - continue VAC on LLE and allow soft tissue to declare itself - discussed with patient that he will need BKA given amount of foot and skin that was debrided - will plan for LLE BKA next week - agree with ABIs for RLE as toes are beginning to become discolored concerning for necrosis - will follow for clinical worsening  Marianna Payment 09/04/2013, 8:13  AM [phone]

## 2013-09-04 NOTE — Progress Notes (Signed)
Rehab Admissions Coordinator Note:  Patient was screened by Annaclaire Walsworth L for appropriateness for an Inpatient Acute Rehab Consult.  At this time, we will monitor pt's status as further L BKA surgery is anticipated next week. Per PT, pt may be a great inpatient rehab candidate following this surgery.  Please call with any questions.   Nanetta Batty, PT Rehabilitation Admissions Coordinator [phone]

## 2013-09-04 NOTE — Progress Notes (Signed)
Inpatient Diabetes Program Recommendations  AACE/ADA: New Consensus Statement on Inpatient Glycemic Control (2013)  Target Ranges:  Prepandial:   less than 140 mg/dL      Peak postprandial:   less than 180 mg/dL (1-2 hours)      Critically ill patients:  140 - 180 mg/dL     **Patient remains on IV insulin drip per GlucoStabilizer.  Anion gap still 14 as of this morning (from 10am BMET) CO2 still 17.    **Spoke with pt about new diagnosis of DM.  Discussed A1C results with him and explained what an A1C is, basic pathophysiology of DM Type 2, basic home care, importance of checking CBGs and maintaining good CBG control to prevent long-term and short-term complications.  Reviewed signs and symptoms of hyperglycemia and hypoglycemia.  RNs to provide ongoing basic DM education at bedside with this patient.  Have ordered educational booklet, insulin starter kit, and DM videos.  Have also ordered RD consult for basic DM diet education.  **Patient is currently self pay (no health insurance).  RN care manager is seeking follow up appointment with the [facility] after d/c.  Will need to keep cost considerations in mind when prescribing insulin at d/c.  When patient ready to transition off IV insulin drip would use Lantus and Novolog while here in hospital.  However, at d/c, patient will need Reli-on 70/30 insulin for cost (Reli-on 70/30 insulin is $25 per vial at [facility]).    Will follow. Wyn Quaker RN, MSN, CDE Diabetes Coordinator Inpatient Diabetes Program Team Pager: [phone] (8a-10p)

## 2013-09-04 NOTE — Evaluation (Signed)
Physical Therapy Evaluation Patient Details Name: [name] MRN: [id number] DOB: [birth year] Today's Date: 09/04/2013 Time: [id number] PT Time Calculation (min): 23 min  PT Assessment / Plan / Recommendation History of Present Illness   Patient is a 58 y.o. male who was adm to the hospital and states that he had foot swelling since last week. Patient became incontinent of urine, feeling lightheaded, blurred vision, patient states that he started to loose balance as well. Denies any significant chest pain. Patient states that his feet are numb since last year. Patient has been wearing same socks and tenniss shoes for past 3 months at least. Patient was found to have ulcerations of left foot. S/P debridement of bone, muscle, and soft tissue with fifth ray amputation and amputation of fourth toe through the metatarsal phalangeal joint on 2/3. Wound VAC was also placed.   Clinical Impression  Pt adm due to Lt foot pain. Patient is s/p S/P debridement of bone, muscle, and soft tissue with fifth ray amputation and amputation of fourth toe through the metatarsal phalangeal joint on 2/3 surgery resulting in functional limitations due to the deficits listed below (see PT Problem List). Patient will benefit from skilled PT to increase their independence and safety with mobility to allow discharge to the venue listed below. Pt planned to have surgery next week for Lt BKA. Rt LE also being monitored due to discoloration of toes. Pt will be a great candidate for CIR s/p Lt BKA. Pt lives with his sister at home and was fully independent prior to admission.      PT Assessment  Patient needs continued PT services    Follow Up Recommendations  CIR    Does the patient have the potential to tolerate intense rehabilitation      Barriers to Discharge        Equipment Recommendations  3in1 (PT);Rolling walker with 5" wheels;Wheelchair (measurements PT);Wheelchair cushion (measurements PT)     Recommendations for Other Services Rehab consult;OT consult   Frequency Min 3X/week    Precautions / Restrictions Precautions Precautions: Fall Precaution Comments: wound VAC Restrictions Weight Bearing Restrictions: Yes LLE Weight Bearing: Non weight bearing   Pertinent Vitals/Pain 8/10; patient repositioned for comfort with LEs elevated       Mobility  Bed Mobility Overal bed mobility: Needs Assistance Bed Mobility: Supine to Sit Supine to sit: Supervision General bed mobility comments: HOB elevated and pt relied on handrails; min cues for management of lines Transfers Overall transfer level: Needs assistance Equipment used: 2 person hand held assist Transfers: Sit to/from Stand;Stand Pivot Transfers Sit to Stand: +2 safety/equipment;Min assist Stand pivot transfers: +2 safety/equipment;Mod assist General transfer comment: cues to maintain NWB status on Lt LE; 2 person (A) for safety; pt tends to get anxious with transfers; cues for hand placement and sequencing  Ambulation/Gait General Gait Details: not assessed    Exercises General Exercises - Lower Extremity Ankle Circles/Pumps: Both;10 reps;Right   PT Diagnosis: Difficulty walking;Generalized weakness;Acute pain  PT Problem List: Decreased strength;Decreased balance;Decreased activity tolerance;Decreased mobility;Decreased knowledge of use of DME;Decreased safety awareness;Pain;Impaired sensation PT Treatment Interventions: DME instruction;Gait training;Stair training;Functional mobility training;Therapeutic activities;Balance training;Therapeutic exercise;Neuromuscular re-education;Patient/family education     PT Goals(Current goals can be found in the care plan section) Acute Rehab PT Goals Patient Stated Goal: to have surgery then get better PT Goal Formulation: With patient Time For Goal Achievement: 09/18/13 Potential to Achieve Goals: Good  Visit Information  Last PT Received On: 09/04/13 Assistance  Needed: +2 History of  Present Illness:  Patient is a 57 y.o. male who was adm to the hospital and states that he had foot swelling since last week. Patient became incontinent of urine, feeling lightheaded, blurred vision, patient states that he started to loose balance as well. Denies any significant chest pain. Patient states that his feet are numb since last year. Patient has been wearing same socks and tenniss shoes for past 3 months at least. Patient was found to have ulcerations of left foot. 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        Prior Functioning  Home Living Family/patient expects to be discharged to:: Private residence Living Arrangements: Other (Comment) (Sister) Available Help at Discharge: Family;Available 24 hours/day Type of Home: House Home Access: Stairs to enter [employer] of Steps: 2-3 Entrance Stairs-Rails: None Home Layout: One level Home Equipment: None Prior Function Level of Independence: Independent Communication Communication: No difficulties    Cognition  Cognition Arousal/Alertness: Awake/alert Behavior During Therapy: Impulsive Overall Cognitive Status: Within Functional Limits for tasks assessed    Extremity/Trunk Assessment Upper Extremity Assessment Upper Extremity Assessment: Defer to OT evaluation Lower Extremity Assessment Lower Extremity Assessment: LLE deficits/detail (neuropathy ) LLE: Unable to fully assess due to pain LLE Sensation: history of peripheral neuropathy Cervical / Trunk Assessment Cervical / Trunk Assessment: Kyphotic   Balance Balance Overall balance assessment: Needs assistance;History of Falls Sitting-balance support: Feet unsupported;No upper extremity supported (Rt LE supported) Sitting balance-Leahy Scale: Good Standing balance support: During functional activity;Bilateral upper extremity supported Standing balance-Leahy Scale: Poor Standing balance comment: bil UE  supported General Comments General comments (skin integrity, edema, etc.): Rt toes are black and diminished sensation   End of Session PT - End of Session Equipment Utilized During Treatment: Gait belt;Other (comment) (Lt Wound Vac) Activity Tolerance: Patient tolerated treatment well Patient left: in chair;with call bell/phone within reach Nurse Communication: Mobility status  GP     Gustavus Bryant, [place] 09/04/2013, 3:50 PM

## 2013-09-05 LAB — BASIC METABOLIC PANEL
BUN: 8 mg/dL (ref 6–23)
BUN: 7 mg/dL (ref 6–23)
BUN: 8 mg/dL (ref 6–23)
BUN: 7 mg/dL (ref 6–23)
CALCIUM: 7.1 mg/dL — AB (ref 8.4–10.5)
CHLORIDE: 107 meq/L (ref 96–112)
CHLORIDE: 104 meq/L (ref 96–112)
CO2: 19 mEq/L (ref 19–32)
CO2: 20 meq/L (ref 19–32)
CO2: 20 meq/L (ref 19–32)
CO2: 21 mEq/L (ref 19–32)
CREATININE: 0.97 mg/dL (ref 0.50–1.35)
CREATININE: 1.01 mg/dL (ref 0.50–1.35)
CREATININE: 1.01 mg/dL (ref 0.50–1.35)
Calcium: 7.2 mg/dL — ABNORMAL LOW (ref 8.4–10.5)
Calcium: 7.2 mg/dL — ABNORMAL LOW (ref 8.4–10.5)
Calcium: 7.2 mg/dL — ABNORMAL LOW (ref 8.4–10.5)
Chloride: 109 mEq/L (ref 96–112)
Chloride: 107 mEq/L (ref 96–112)
Creatinine, Ser: 1 mg/dL (ref 0.50–1.35)
GFR calc Af Amer: >90 mL/min (ref >90)
GFR calc Af Amer: >90 mL/min (ref >90)
GFR calc non Af Amer: >90 mL/min (ref >90)
GFR calc non Af Amer: 83 mL/min — ABNORMAL LOW (ref >90)
GFR calc non Af Amer: 82 mL/min — ABNORMAL LOW (ref >90)
GFR calc non Af Amer: 82 mL/min — ABNORMAL LOW (ref >90)
GLUCOSE: 170 mg/dL — AB (ref 70–99)
GLUCOSE: 162 mg/dL — AB (ref 70–99)
GLUCOSE: 137 mg/dL — AB (ref 70–99)
Glucose, Bld: 101 mg/dL — ABNORMAL HIGH (ref 70–99)
POTASSIUM: 3.5 meq/L — AB (ref 3.7–5.3)
Potassium: 3.5 mEq/L — ABNORMAL LOW (ref 3.7–5.3)
Potassium: 3.5 mEq/L — ABNORMAL LOW (ref 3.7–5.3)
Potassium: 3.5 mEq/L — ABNORMAL LOW (ref 3.7–5.3)
Sodium: 144 mEq/L (ref 137–147)
Sodium: 141 mEq/L (ref 137–147)
Sodium: 141 mEq/L (ref 137–147)
Sodium: 140 mEq/L (ref 137–147)

## 2013-09-05 LAB — CBC
HCT: 26.6 % — ABNORMAL LOW (ref 39.0–52.0)
Hemoglobin: 9.8 g/dL — ABNORMAL LOW (ref 13.0–17.0)
MCH: 30.3 pg (ref 26.0–34.0)
MCHC: 36.8 g/dL — AB (ref 30.0–36.0)
MCV: 82.4 fL (ref 78.0–100.0)
Platelets: 161 10*3/uL (ref 150–400)
RBC: 3.23 MIL/uL — AB (ref 4.22–5.81)
RDW: 13.6 % (ref 11.5–15.5)
WBC: 17.8 10*3/uL — AB (ref 4.0–10.5)

## 2013-09-05 LAB — GLUCOSE, CAPILLARY
GLUCOSE-CAPILLARY: 149 mg/dL — AB (ref 70–99)
GLUCOSE-CAPILLARY: 149 mg/dL — AB (ref 70–99)
GLUCOSE-CAPILLARY: 123 mg/dL — AB (ref 70–99)
GLUCOSE-CAPILLARY: 202 mg/dL — AB (ref 70–99)
GLUCOSE-CAPILLARY: 139 mg/dL — AB (ref 70–99)
GLUCOSE-CAPILLARY: 146 mg/dL — AB (ref 70–99)
GLUCOSE-CAPILLARY: 137 mg/dL — AB (ref 70–99)
Glucose-Capillary: 106 mg/dL — ABNORMAL HIGH (ref 70–99)
Glucose-Capillary: 134 mg/dL — ABNORMAL HIGH (ref 70–99)
Glucose-Capillary: 135 mg/dL — ABNORMAL HIGH (ref 70–99)
Glucose-Capillary: 147 mg/dL — ABNORMAL HIGH (ref 70–99)
Glucose-Capillary: 149 mg/dL — ABNORMAL HIGH (ref 70–99)
Glucose-Capillary: 151 mg/dL — ABNORMAL HIGH (ref 70–99)
Glucose-Capillary: 153 mg/dL — ABNORMAL HIGH (ref 70–99)
Glucose-Capillary: 156 mg/dL — ABNORMAL HIGH (ref 70–99)
Glucose-Capillary: 161 mg/dL — ABNORMAL HIGH (ref 70–99)
Glucose-Capillary: 164 mg/dL — ABNORMAL HIGH (ref 70–99)
Glucose-Capillary: 178 mg/dL — ABNORMAL HIGH (ref 70–99)
Glucose-Capillary: 185 mg/dL — ABNORMAL HIGH (ref 70–99)

## 2013-09-05 LAB — PHOSPHORUS: PHOSPHORUS: 1.7 mg/dL — AB (ref 2.3–4.6)

## 2013-09-05 MED ORDER — INSULIN ASPART 100 UNIT/ML ~~LOC~~ SOLN
0.0000 [IU] | Freq: Every day | SUBCUTANEOUS | Status: DC
  Administered 2013-09-06 – 2013-09-08 (×3): 2 [IU] via SUBCUTANEOUS
  Administered 2013-09-10 – 2013-09-12 (×2): 3 [IU] via SUBCUTANEOUS

## 2013-09-05 MED ORDER — INSULIN GLARGINE 100 UNIT/ML ~~LOC~~ SOLN
15.0000 [IU] | Freq: Every day | SUBCUTANEOUS | Status: DC
  Administered 2013-09-05 – 2013-09-06 (×2): 15 [IU] via SUBCUTANEOUS
  Filled 2013-09-05 (×3): qty 0.15

## 2013-09-05 MED ORDER — INSULIN ASPART 100 UNIT/ML ~~LOC~~ SOLN
0.0000 [IU] | Freq: Three times a day (TID) | SUBCUTANEOUS | Status: DC
  Administered 2013-09-06: 11 [IU] via SUBCUTANEOUS
  Administered 2013-09-06: 13:00:00 via SUBCUTANEOUS
  Administered 2013-09-06 – 2013-09-08 (×7): 7 [IU] via SUBCUTANEOUS
  Administered 2013-09-09 (×2): 4 [IU] via SUBCUTANEOUS
  Administered 2013-09-09: 11 [IU] via SUBCUTANEOUS
  Administered 2013-09-10 (×2): 7 [IU] via SUBCUTANEOUS
  Administered 2013-09-10: 4 [IU] via SUBCUTANEOUS
  Administered 2013-09-11 – 2013-09-12 (×3): 11 [IU] via SUBCUTANEOUS
  Administered 2013-09-12 – 2013-09-13 (×3): 7 [IU] via SUBCUTANEOUS

## 2013-09-05 NOTE — Progress Notes (Signed)
ANTIBIOTIC CONSULT NOTE - FOLLOW UP  Pharmacy Consult for vancomycin  Indication: Suspected necrotizing infection of left foot  No Known Allergies  Patient Measurements: Height: 5\' 3"  (160 cm) Weight: 148 lb (67.132 kg) IBW/kg (Calculated) : 56.9  Vital Signs: Temp: 99.7 F (37.6 C) (02/05 0816) Temp src: Oral (02/05 0816) BP: 124/62 mmHg (02/05 0816) Pulse Rate: 95 (02/05 0816) Intake/Output from previous day: 02/04 0701 - 02/05 0700 In: 3777.2 [P.O.:720; I.V.:2307.2; IV Piggyback:750] Out: 1870 [id number] Intake/Output from this shift:    Labs:  Recent Labs  09/03/13 0300  09/04/13 2239 09/05/13 0604 09/05/13 0915  WBC 22.3*  --   --  17.8*  --   HGB 12.3*  --   --  9.8*  --   PLT 245  --   --  161  --   CREATININE 0.91  < > 0.98 0.97 1.00  < > = values in this interval not displayed. Estimated Creatinine Clearance: 67.2 ml/min (by C-G formula based on Cr of 1). No results found for this basename: VANCOTROUGH, VANCOPEAK, VANCORANDOM, GENTTROUGH, GENTPEAK, GENTRANDOM, TOBRATROUGH, TOBRAPEAK, TOBRARND, AMIKACINPEAK, AMIKACINTROU, AMIKACIN,  in the last 72 hours   Microbiology: Recent Results (from the past 720 hour(s))  WOUND CULTURE     Status: None   Collection Time    09/01/13  8:18 PM      Result Value Range Status   Specimen Description WOUND LEFT FOOT   Final   Special Requests NONE   Final   Gram Stain     Final   Value: NO WBC SEEN     NO SQUAMOUS EPITHELIAL CELLS SEEN     MODERATE GRAM POSITIVE COCCI IN PAIRS     MODERATE GRAM NEGATIVE RODS     Performed at [facility]   Culture     Final   Value: FEW PROTEUS MIRABILIS     Performed at [facility]   Report Status PENDING   Incomplete   Organism ID, Bacteria PROTEUS MIRABILIS   Final  ANAEROBIC CULTURE     Status: None   Collection Time    09/03/13 12:03 PM      Result Value Range Status   Specimen Description ABSCESS FOOT LEFT   Final   Special Requests PT ON VANCO ZOSYN    Final   Gram Stain     Final   Value: NO WBC SEEN     NO SQUAMOUS EPITHELIAL CELLS SEEN     MODERATE GRAM POSITIVE COCCI IN PAIRS     Performed at [facility]   Culture     Final   Value: NO ANAEROBES ISOLATED; CULTURE IN PROGRESS FOR 5 DAYS     Performed at [facility]   Report Status PENDING   Incomplete  CULTURE, ROUTINE-ABSCESS     Status: None   Collection Time    09/03/13 12:03 PM      Result Value Range Status   Specimen Description ABSCESS FOOT LEFT   Final   Special Requests PT ON VANCO ZOSYN   Final   Gram Stain     Final   Value: NO WBC SEEN     NO SQUAMOUS EPITHELIAL CELLS SEEN     MODERATE GRAM POSITIVE COCCI IN PAIRS     Performed at [facility]   Culture     Final   Value: Culture reincubated for better growth     Performed at [facility]   Report Status PENDING  Incomplete  CULTURE, BLOOD (ROUTINE X 2)     Status: None   Collection Time    09/03/13 10:00 PM      Result Value Range Status   Specimen Description BLOOD RIGHT HAND   Final   Special Requests BOTTLES DRAWN AEROBIC ONLY 3CC   Final   Culture  Setup Time     Final   Value: 09/04/2013 03:30     Performed at [facility]   Culture     Final   Value:        BLOOD CULTURE RECEIVED NO GROWTH TO DATE CULTURE WILL BE HELD FOR 5 DAYS BEFORE ISSUING A FINAL NEGATIVE REPORT     Performed at [facility]   Report Status PENDING   Incomplete  CULTURE, BLOOD (ROUTINE X 2)     Status: None   Collection Time    09/03/13 10:05 PM      Result Value Range Status   Specimen Description BLOOD LEFT HAND   Final   Special Requests BOTTLES DRAWN AEROBIC ONLY 4CC   Final   Culture  Setup Time     Final   Value: 09/04/2013 03:30     Performed at [facility]   Culture     Final   Value:        BLOOD CULTURE RECEIVED NO GROWTH TO DATE CULTURE WILL BE HELD FOR 5 DAYS BEFORE ISSUING A FINAL NEGATIVE REPORT     Performed at [facility]   Report Status  PENDING   Incomplete    Assessment: 58 year old male with necrotizing foot infection, planned for amputation next week. Patient with low grade fever of 100.2 overnight, wbc elevated but trending down this morning at 17. Renal function is stable. It appears patient missed dose of vancomycin on 2/3 subsequent dosing is charted. Will plan on checking vancomycin trough in am on 2/6 to assess dosing. No dose adjustments warranted for zosyn or clinda.  2/1 wound cx - few proteus- pan sens 2/3 Abscess L foot>> 2/3 BC X 2>>ngtd  Goal of Therapy:  Vancomycin 15-20  Plan:  Vancomycin 1g IV q12 hours - check trough in am 2/6 Zosyn 3.375g IV q8 hours Clinda 600mg  q8 hours  Erin Hearing PharmD., BCPS Clinical Pharmacist Pager [phone] 09/05/2013 11:19 AM

## 2013-09-05 NOTE — Progress Notes (Signed)
Inpatient Diabetes Program Recommendations  AACE/ADA: New Consensus Statement on Inpatient Glycemic Control (2013)  Target Ranges:  Prepandial:   less than 140 mg/dL      Peak postprandial:   less than 180 mg/dL (1-2 hours)      Critically ill patients:  140 - 180 mg/dL     **Patient remains on IV insulin drip per [employer]. Anion gap still 14 as of this afternoon (from 1pm BMET) CO2 now 20.   **Spoke with pt about new diagnosis of DM yesterday (see my note).   **Patient is currently self pay (no health insurance). RN care manager is seeking follow up appointment with the [facility] after d/c.  Patient may need SNF or Inpatient Rehab after d/c.   Will need to keep cost considerations in mind when prescribing insulin at d/c.   **When patient ready to transition off IV insulin drip would use Lantus and Novolog while here in hospital. However, at d/c, patient will need Reli-on 70/30 insulin for cost (Reli-on 70/30 insulin is $25 per vial at [facility]).   Will follow. Wyn Quaker RN, MSN, CDE Diabetes Coordinator Inpatient Diabetes Program Team Pager: [phone] (8a-10p)

## 2013-09-05 NOTE — Progress Notes (Signed)
PATIENT DETAILS Name: [name] Age: 58 y.o. Sex: male Date of Birth: [birth year] Admit Date: 09/01/2013 Admitting Physician Toy Baker, MD PCP:No PCP Per Patient  Subjective: No major complaints-essentially unchanged  Assessment/Plan: Suspected necrotizing infection of the left foot - Patient presented with a foul-smelling ulceration of the left foot, x-ray on admission showed gas in the soft tissue. Patient had significant leukocytosis. Patient was subsequently admitted, orthopedics was consulted. - Currently on vancomycin, Zosyn and clindamycin-day 3 -  MRI left foot confirms necrotizing infection, followed by ORtho,  On 2/3 Underwent :  1. Debridement of bone, muscle, skin, soft tissue. CPT 11042  2. Fifth ray amputation.  3. 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  4. Application of wound vac -plan for BKA next week per Ortho White blood cell count slowly improving  Black appearing toe-right foot -FU ABI - need to watch the right foot as well  Diabetic ketoacidosis - Currently on an insulin infusion, day 2 due to severe foot infection, anion gap down to 14 with improvement in bicarbonate level. Discontinuing drip and starting Lantus 15 units plus sliding scale.  - New diagnosis of diabetes this admission, A1c 12.2 -will require Insulin on discharge, Lantus now and insulin 70/30 on discharge -DM coordinator consult appreciated  Hypokalemia - Continue to replace potassium. - Bmet every 4 hours for now till gap corrected  Hypophosphatemia - Secondary to poor nutritional status, insulin infusion. - Continue to supplement potassium, watch for refeeding syndrome -recheck PO4 in am  DVT Prophylaxis:  SCD's  Code Status:Full code  Family Communication: Left messages family Disposition:Remain inpatient, surgery next week  Procedures: 2/3 by dr.Xu 1. Debridement of bone, muscle, skin, soft tissue. CPT 11042  2. Fifth ray  amputation.  3. 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  4. Application of negative pressure wound therapy greater   CONSULTS:  orthopedic surgery     MEDICATIONS: Scheduled Meds: . clindamycin (CLEOCIN) IV  600 mg Intravenous Q8H  . feeding supplement (GLUCERNA SHAKE)  237 mL Oral BID BM  . insulin regular  0-10 Units Intravenous TID WC  . piperacillin-tazobactam (ZOSYN)  IV  3.375 g Intravenous Q8H  . sodium chloride  3 mL Intravenous Q12H  . vancomycin  1,000 mg Intravenous Q12H   Continuous Infusions: . dextrose 5 % and 0.9% NaCl 1,000 mL with potassium chloride 20 mEq infusion 125 mL/hr at 09/04/13 2222  . insulin (NOVOLIN-R) infusion 5.5 Units/hr (09/05/13 1701)  . lactated ringers 20 mL/hr at 09/03/13 1110   PRN Meds:.acetaminophen, dextrose, diphenhydrAMINE, HYDROcodone-acetaminophen, morphine injection, morphine injection, ondansetron (ZOFRAN) IV, ondansetron, oxyCODONE  Antibiotics: Anti-infectives   Start     Dose/Rate Route Frequency Ordered Stop   09/02/13 1100  vancomycin (VANCOCIN) IVPB 1000 mg/200 mL premix     1,000 mg 200 mL/hr over 60 Minutes Intravenous Every 12 hours 09/02/13 0938     09/02/13 0800  clindamycin (CLEOCIN) IVPB 600 mg     600 mg 100 mL/hr over 30 Minutes Intravenous 3 times per day 09/02/13 0714     09/02/13 0600  piperacillin-tazobactam (ZOSYN) IVPB 3.375 g  Status:  Discontinued     3.375 g 100 mL/hr over 30 Minutes Intravenous 3 times per day 09/02/13 0351 09/02/13 0352   09/02/13 0600  piperacillin-tazobactam (ZOSYN) IVPB 3.375 g     3.375 g 12.5 mL/hr over 240 Minutes Intravenous Every 8 hours 09/02/13 0353     09/01/13  2315  vancomycin (VANCOCIN) IVPB 1000 mg/200 mL premix     1,000 mg 200 mL/hr over 60 Minutes Intravenous  Once 09/01/13 2311 09/02/13 0053   09/01/13 2315  piperacillin-tazobactam (ZOSYN) IVPB 3.375 g     3.375 g 100 mL/hr over 30 Minutes Intravenous  Once 09/01/13 2311 09/01/13 2351    09/01/13 2100  ceFAZolin (ANCEF) IVPB 1 g/50 mL premix     1 g 100 mL/hr over 30 Minutes Intravenous  Once 09/01/13 2052 09/01/13 2314      PHYSICAL EXAM: Vital signs in last 24 hours: Filed Vitals:   09/05/13 0006 09/05/13 0418 09/05/13 0816 09/05/13 1400  BP: 127/60 123/61 124/62 128/60  Pulse: 101 96 95 104  Temp: 98.8 F (37.1 C) 100.2 F (37.9 C) 99.7 F (37.6 C) 99.8 F (37.7 C)  TempSrc: Oral Oral Oral Oral  Resp: 20 18 18 18   Height:      Weight:      SpO2: 98% 98% 96% 99%    Weight change:  Filed Weights   09/01/13 2100 09/01/13 2209 09/02/13 0349  Weight: 65.318 kg (144 lb) 65.318 kg (144 lb) 67.132 kg (148 lb)   Body mass index is 26.22 kg/(m^2).   Gen Exam: Awake, AAOx3, no acute distress Lungs: Clear to auscultation bilaterally CVS: Regular rate and rhythm, S1-S2 Abdomen: soft, BS +, non tender, non distended.  Extremities: no edema, large wound with vac  Black appearing toe in right foot-but foot is warm     Intake/Output from previous day:  Intake/Output Summary (Last 24 hours) at 09/05/13 1853 Last data filed at 09/05/13 1551  Gross per 24 hour  Intake 3057.16 ml  Output   1500 ml  Net 1557.16 ml     LAB RESULTS: CBC  Recent Labs Lab 09/01/13 2015 09/02/13 0625 09/03/13 0300 09/05/13 0604  WBC 26.8* 23.3* 22.3* 17.8*  HGB 15.0 12.3* 12.3* 9.8*  HCT 40.9 33.4* 32.6* 26.6*  PLT 309 256 245 161  MCV 85.6 83.1 81.1 82.4  MCH 31.4 30.6 30.6 30.3  MCHC 36.7* 36.8* 37.7* 36.8*  RDW 13.5 13.3 13.1 13.6    Chemistries   Recent Labs Lab 09/01/13 2200 09/02/13 0625  09/04/13 1806 09/04/13 2239 09/05/13 0604 09/05/13 0915 09/05/13 1310  NA 133* 134*  < > 141 141 144 141 141  K 3.8 3.0*  < > 3.2* 3.3* 3.5* 3.5* 3.5*  CL 95* 99  < > 107 108 109 107 107  CO2 9* 13*  < > 18* 19 19 20 20   GLUCOSE 594* 309*  < > 119* 144* 101* 170* 162*  BUN 28* 25*  < > 7 8 8 7 8   CREATININE 0.89 0.83  < > 0.86 0.98 0.97 1.00 1.01  CALCIUM 9.5 9.0   < > 7.5* 7.3* 7.2* 7.2* 7.2*  MG  --  2.5  --   --   --   --   --   --   < > = values in this interval not displayed.  CBG:  Recent Labs Lab 09/05/13 1402 09/05/13 1504 09/05/13 1559 09/05/13 1658 09/05/13 1800  GLUCAP 134* 153* 147* 139* 146*    GFR Estimated Creatinine Clearance: 66.5 ml/min (by C-G formula based on Cr of 1.01).  Coagulation profile No results found for this basename: INR, PROTIME,  in the last 168 hours  Cardiac Enzymes No results found for this basename: CK, CKMB, TROPONINI, MYOGLOBIN,  in the last 168 hours  No components found with  this basename: POCBNP,  No results found for this basename: DDIMER,  in the last 72 hours No results found for this basename: HGBA1C,  in the last 72 hours No results found for this basename: CHOL, HDL, LDLCALC, TRIG, CHOLHDL, LDLDIRECT,  in the last 72 hours No results found for this basename: TSH, T4TOTAL, FREET3, T3FREE, THYROIDAB,  in the last 72 hours No results found for this basename: VITAMINB12, FOLATE, FERRITIN, TIBC, IRON, RETICCTPCT,  in the last 72 hours No results found for this basename: LIPASE, AMYLASE,  in the last 72 hours  Urine Studies No results found for this basename: UACOL, UAPR, USPG, UPH, UTP, UGL, UKET, UBIL, UHGB, UNIT, UROB, ULEU, UEPI, UWBC, URBC, UBAC, CAST, CRYS, UCOM, BILUA,  in the last 72 hours  MICROBIOLOGY: Recent Results (from the past 240 hour(s))  WOUND CULTURE     Status: None   Collection Time    09/01/13  8:18 PM      Result Value Range Status   Specimen Description WOUND LEFT FOOT   Final   Special Requests NONE   Final   Gram Stain     Final   Value: NO WBC SEEN     NO SQUAMOUS EPITHELIAL CELLS SEEN     MODERATE GRAM POSITIVE COCCI IN PAIRS     MODERATE GRAM NEGATIVE RODS     Performed at [facility]   Culture     Final   Value: FEW PROTEUS MIRABILIS     Performed at [facility]   Report Status PENDING   Incomplete   Organism ID, Bacteria PROTEUS  MIRABILIS   Final  ANAEROBIC CULTURE     Status: None   Collection Time    09/03/13 12:03 PM      Result Value Range Status   Specimen Description ABSCESS FOOT LEFT   Final   Special Requests PT ON VANCO ZOSYN   Final   Gram Stain     Final   Value: NO WBC SEEN     NO SQUAMOUS EPITHELIAL CELLS SEEN     MODERATE GRAM POSITIVE COCCI IN PAIRS     Performed at [facility]   Culture     Final   Value: NO ANAEROBES ISOLATED; CULTURE IN PROGRESS FOR 5 DAYS     Performed at [facility]   Report Status PENDING   Incomplete  CULTURE, ROUTINE-ABSCESS     Status: None   Collection Time    09/03/13 12:03 PM      Result Value Range Status   Specimen Description ABSCESS FOOT LEFT   Final   Special Requests PT ON VANCO ZOSYN   Final   Gram Stain     Final   Value: NO WBC SEEN     NO SQUAMOUS EPITHELIAL CELLS SEEN     MODERATE GRAM POSITIVE COCCI IN PAIRS     Performed at [facility]   Culture     Final   Value: Culture reincubated for better growth     Performed at [facility]   Report Status PENDING   Incomplete  CULTURE, BLOOD (ROUTINE X 2)     Status: None   Collection Time    09/03/13 10:00 PM      Result Value Range Status   Specimen Description BLOOD RIGHT HAND   Final   Special Requests BOTTLES DRAWN AEROBIC ONLY 3CC   Final   Culture  Setup Time     Final  Value: 09/04/2013 03:30     Performed at [facility]   Culture     Final   Value:        BLOOD CULTURE RECEIVED NO GROWTH TO DATE CULTURE WILL BE HELD FOR 5 DAYS BEFORE ISSUING A FINAL NEGATIVE REPORT     Performed at [facility]   Report Status PENDING   Incomplete  CULTURE, BLOOD (ROUTINE X 2)     Status: None   Collection Time    09/03/13 10:05 PM      Result Value Range Status   Specimen Description BLOOD LEFT HAND   Final   Special Requests BOTTLES DRAWN AEROBIC ONLY 4CC   Final   Culture  Setup Time     Final   Value: 09/04/2013 03:30     Performed at [facility]   Culture     Final   Value:        BLOOD CULTURE RECEIVED NO GROWTH TO DATE CULTURE WILL BE HELD FOR 5 DAYS BEFORE ISSUING A FINAL NEGATIVE REPORT     Performed at [facility]   Report Status PENDING   Incomplete    RADIOLOGY STUDIES/RESULTS: Dg Foot Complete Left  09/01/2013   CLINICAL DATA:  Skin ulcerations both feet.  EXAM: LEFT FOOT - COMPLETE 3+ VIEW  COMPARISON:  None.  FINDINGS: Extensive gas is seen tracking in dorsal and plantar soft tissues of the left foot. Although diffuse, the largest collection of gas is seen about the third, fourth and fifth metatarsals. Soft tissues of the foot are swollen. No radiopaque foreign body is identified. No bony destructive change is seen. 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 Well corticated defect in the head of the middle phalanx of the second toe is identified and may be postoperative.  IMPRESSION: Extensive soft tissue gas in the right foot is consistent with cellulitis and abscess formation. Necrotizing fasciitis can cause this appearance. No plain film evidence of osteomyelitis is identified.   Electronically Signed   By: Inge Rise M.D.   On: 09/01/2013 21:20   Dg Foot Complete Right  09/01/2013   ADDENDUM REPORT: 09/01/2013 21:57  ADDENDUM: This addendum is given for the purpose of noting that refreence to the right foot in the impression is incorrect. The report is for the left foot.   Electronically Signed   By: Inge Rise M.D.   On: 09/01/2013 21:57   09/01/2013   CLINICAL DATA:  Ulcers on both feet.  EXAM: RIGHT FOOT COMPLETE - 3+ VIEW  COMPARISON:  None.  FINDINGS: No bony destructive change, radiopaque foreign body or soft tissue gas collection is identified. There appear to be skin ulcerations in the lateral margin of the foot. There is no fracture or dislocation. Prominent calcaneal spurs are noted.  IMPRESSION: Skin ulcerations without acute bony  or joint abnormality.  Electronically Signed: By: Inge Rise M.D. On: 09/01/2013 21:15    Annita Brod, MD  [facility] Pager:[phone]  25 minutes spent  If 7PM-7AM, please contact night-coverage [email or web address] Password [facility] 09/05/2013, 6:53 PM   LOS: 4 days

## 2013-09-05 NOTE — Progress Notes (Signed)
Subjective: Sitting up eating lunch appears comfortable no complaints.   Objective: Vital signs in last 24 hours: Temp:  [98.6 F (37 C)-100.2 F (37.9 C)] 99.7 F (37.6 C) (02/05 0816) Pulse Rate:  [95-102] 95 (02/05 0816) Resp:  [18-20] 18 (02/05 0816) BP: (123-127)/(60-66) 124/62 mmHg (02/05 0816) SpO2:  [96 %-99 %] 96 % (02/05 0816)  Intake/Output from previous day: 02/04 0701 - 02/05 0700 In: 3777.2 [P.O.:720; I.V.:2307.2; IV Piggyback:750] Out: [id number] [Urine:1870] Intake/Output this shift:     Recent Labs  09/03/13 0300 09/05/13 0604  HGB 12.3* 9.8*    Recent Labs  09/03/13 0300 09/05/13 0604  WBC 22.3* 17.8*  RBC 4.02* 3.23*  HCT 32.6* 26.6*  PLT 245 161    Recent Labs  09/05/13 0604 09/05/13 0915  NA 144 141  K 3.5* 3.5*  CL 109 107  CO2 19 20  BUN 8 7  CREATININE 0.97 1.00  GLUCOSE 101* 170*  CALCIUM 7.2* 7.2*   No results found for this basename: LABPT, INR,  in the last 72 hours  Wound vac in place left foot  Assessment/Plan: Continue current care Dr. Erlinda Hong to resume care next week from ortho standpoint Will continue to follow for clinical signs of worsening     CLARK, GILBERT 09/05/2013, 12:55 PM

## 2013-09-06 LAB — BASIC METABOLIC PANEL
BUN: 9 mg/dL (ref 6–23)
CO2: 22 mEq/L (ref 19–32)
Calcium: 7.2 mg/dL — ABNORMAL LOW (ref 8.4–10.5)
Chloride: 105 mEq/L (ref 96–112)
Creatinine, Ser: 1.05 mg/dL (ref 0.50–1.35)
GFR, EST NON AFRICAN AMERICAN: 78 mL/min — AB (ref >90)
Glucose, Bld: 307 mg/dL — ABNORMAL HIGH (ref 70–99)
POTASSIUM: 4.2 meq/L (ref 3.7–5.3)
SODIUM: 141 meq/L (ref 137–147)

## 2013-09-06 LAB — COMPREHENSIVE METABOLIC PANEL
ALT: 14 U/L (ref 0–53)
AST: 16 U/L (ref 0–37)
Albumin: 1.6 g/dL — ABNORMAL LOW (ref 3.5–5.2)
Alkaline Phosphatase: 114 U/L (ref 39–117)
BUN: 11 mg/dL (ref 6–23)
CHLORIDE: 103 meq/L (ref 96–112)
CO2: 23 meq/L (ref 19–32)
Calcium: 7.3 mg/dL — ABNORMAL LOW (ref 8.4–10.5)
Creatinine, Ser: 0.92 mg/dL (ref 0.50–1.35)
GFR calc non Af Amer: >90 mL/min (ref >90)
Glucose, Bld: 251 mg/dL — ABNORMAL HIGH (ref 70–99)
POTASSIUM: 3.7 meq/L (ref 3.7–5.3)
Sodium: 141 mEq/L (ref 137–147)
Total Bilirubin: 0.2 mg/dL — ABNORMAL LOW (ref 0.3–1.2)
Total Protein: 6.3 g/dL (ref 6.0–8.3)

## 2013-09-06 LAB — VANCOMYCIN, TROUGH: VANCOMYCIN TR: 16.5 ug/mL (ref 10.0–20.0)

## 2013-09-06 LAB — CBC
HCT: 26.9 % — ABNORMAL LOW (ref 39.0–52.0)
Hemoglobin: 9.8 g/dL — ABNORMAL LOW (ref 13.0–17.0)
MCH: 30.2 pg (ref 26.0–34.0)
MCHC: 36.4 g/dL — ABNORMAL HIGH (ref 30.0–36.0)
MCV: 83 fL (ref 78.0–100.0)
Platelets: 149 K/uL — ABNORMAL LOW (ref 150–400)
RBC: 3.24 MIL/uL — ABNORMAL LOW (ref 4.22–5.81)
RDW: 13.6 % (ref 11.5–15.5)
WBC: 14.4 K/uL — ABNORMAL HIGH (ref 4.0–10.5)

## 2013-09-06 LAB — GLUCOSE, CAPILLARY
GLUCOSE-CAPILLARY: 264 mg/dL — AB (ref 70–99)
GLUCOSE-CAPILLARY: 222 mg/dL — AB (ref 70–99)
Glucose-Capillary: 296 mg/dL — ABNORMAL HIGH (ref 70–99)
Glucose-Capillary: 219 mg/dL — ABNORMAL HIGH (ref 70–99)

## 2013-09-06 LAB — PHOSPHORUS: Phosphorus: 3 mg/dL (ref 2.3–4.6)

## 2013-09-06 NOTE — Progress Notes (Signed)
Physical Therapy Treatment Patient Details Name: [name] MRN: [id number] DOB: 1955/11/07 Today's Date: 09/06/2013 Time: [id number] PT Time Calculation (min): 29 min  PT Assessment / Plan / Recommendation  History of Present Illness  Patient is a 58 y.o. male who was adm to the hospital and states that he had foot swelling since last week. Patient became incontinent of urine, feeling lightheaded, blurred vision, patient states that he started to loose balance as well. Denies any significant chest pain. Patient states that his feet are numb since last year. Patient has been wearing same socks and tenniss shoes for past 3 months at least. Patient was found to have ulcerations of left foot. 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 Ortho is following as he may still need L BKA.     PT Comments   Pt is progressing very well with his mobility today requiring only one person assist for both stand and squat pivot transfers.  He was able to maintain NWB on his left foot the entire session.  Started education on [facility] mobility and pt was able to wheel himself a short distance into the hallway.  I continue to recommend CIR level therapies at d/c.    Follow Up Recommendations  CIR     Does the patient have the potential to tolerate intense rehabilitation    yes  Barriers to Discharge   2-3 steps to enter home and NWB left leg.       Equipment Recommendations  3in1 (PT);Rolling walker with 5" wheels;Wheelchair (measurements PT);Wheelchair cushion (measurements PT)    Recommendations for Other Services   None  Frequency Min 3X/week   Progress towards PT Goals Progress towards PT goals: Progressing toward goals  Plan Current plan remains appropriate    Precautions / Restrictions Precautions Precautions: Fall Precaution Comments: wound VAC Restrictions Weight Bearing Restrictions: Yes LLE Weight Bearing: Non weight bearing   Pertinent Vitals/Pain See vitals flow sheet.     Mobility  Bed Mobility Overal bed mobility: Needs Assistance Bed Mobility: Sit to Supine Supine to sit: Modified independent (Device/Increase time) General bed mobility comments: Mod I with railing for leverage, HOB flat.  Transfers Overall transfer level: Needs assistance Equipment used: 1 person hand held assist Transfers: Sit to/from [employer] Transfers Sit to Stand: Min assist Stand pivot transfers: Min assist Squat pivot transfers: Min guard General transfer comment: Min assist for full stand pivot transfer to support trunk for balance.  Pt pivoting on his right leg from recliner to [facility] with arm rest removed on his right.  WC back to bed was easier because he was able to use the bed rail for stability and there was no armrest in the way (like on the recliner chair) .  He could almost do this at supervision level.   Product manager mobility: Yes Wheelchair propulsion: Both upper extremities Wheelchair parts: Supervision/cueing Distance: 60     PT Goals (current goals can now be found in the care plan section) Acute Rehab PT Goals Patient Stated Goal: to have surgery then get better  Visit Information  Last PT Received On: 09/06/13 Assistance Needed: +1 History of Present Illness:  Patient is a 58 y.o. male who was adm to the hospital and states that he had foot swelling since last week. Patient became incontinent of urine, feeling lightheaded, blurred vision, patient states that he started to loose balance as well. Denies any significant chest pain. Patient states that his feet  are numb since last year. Patient has been wearing same socks and tenniss shoes for past 3 months at least. Patient was found to have ulcerations of left foot. 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 Ortho is following as he may still need L BKA.      Subjective Data  Subjective: Pt reports he is feeling stronger Patient Stated Goal: to  have surgery then get better   Cognition  Cognition Arousal/Alertness: Awake/alert Behavior During Therapy: WFL for tasks assessed/performed Overall Cognitive Status: Within Functional Limits for tasks assessed (not specifically tested) General Comments: No impulsivity today, good awareness of his body position and maintaining L foot NWB throughout mobility.     Balance  Balance Sitting-balance support: Bilateral upper extremity supported;Feet supported Sitting balance-Leahy Scale: Good Standing balance support: Bilateral upper extremity supported;During functional activity Standing balance-Leahy Scale: Poor  End of Session PT - End of Session Equipment Utilized During Treatment: Gait belt;Other (comment) Activity Tolerance: Patient limited by pain;Patient limited by fatigue Patient left: in bed;with call bell/phone within reach Nurse Communication: Mobility status    Wells Guiles B. [place], [place], DPT [phone]   09/06/2013, 11:13 AM

## 2013-09-06 NOTE — Progress Notes (Signed)
ANTIBIOTIC CONSULT NOTE - FOLLOW UP  Pharmacy Consult for vancomycin  Indication: Suspected necrotizing infection of left foot  No Known Allergies  Patient Measurements: Height: 5\' 3"  (160 cm) Weight: 148 lb (67.132 kg) IBW/kg (Calculated) : 56.9  Vital Signs: Temp: 99.5 F (37.5 C) (02/06 0500) BP: 124/64 mmHg (02/06 0500) Pulse Rate: 88 (02/06 0500) Intake/Output from previous day: 02/05 0701 - 02/06 0700 In: -  Out: 2750 [Urine:2750] Intake/Output from this shift:    Labs:  Recent Labs  09/05/13 0604  09/05/13 1310 09/05/13 1815 09/06/13 0609  WBC 17.8*  --   --   --  14.4*  HGB 9.8*  --   --   --  9.8*  PLT 161  --   --   --  149*  CREATININE 0.97  < > 1.01 1.01 1.05  < > = values in this interval not displayed. Estimated Creatinine Clearance: 64 ml/min (by C-G formula based on Cr of 1.05).  Recent Labs  09/06/13 1020  VANCOTROUGH 16.5     Microbiology: Recent Results (from the past 720 hour(s))  WOUND CULTURE     Status: None   Collection Time    09/01/13  8:18 PM      Result Value Range Status   Specimen Description WOUND LEFT FOOT   Final   Special Requests NONE   Final   Gram Stain     Final   Value: NO WBC SEEN     NO SQUAMOUS EPITHELIAL CELLS SEEN     MODERATE GRAM POSITIVE COCCI IN PAIRS     MODERATE GRAM NEGATIVE RODS     Performed at [facility]   Culture     Final   Value: FEW PROTEUS MIRABILIS     Performed at [facility]   Report Status PENDING   Incomplete   Organism ID, Bacteria PROTEUS MIRABILIS   Final  ANAEROBIC CULTURE     Status: None   Collection Time    09/03/13 12:03 PM      Result Value Range Status   Specimen Description ABSCESS FOOT LEFT   Final   Special Requests PT ON VANCO ZOSYN   Final   Gram Stain     Final   Value: NO WBC SEEN     NO SQUAMOUS EPITHELIAL CELLS SEEN     MODERATE GRAM POSITIVE COCCI IN PAIRS     Performed at [facility]   Culture     Final   Value: NO ANAEROBES  ISOLATED; CULTURE IN PROGRESS FOR 5 DAYS     Performed at [facility]   Report Status PENDING   Incomplete  CULTURE, ROUTINE-ABSCESS     Status: None   Collection Time    09/03/13 12:03 PM      Result Value Range Status   Specimen Description ABSCESS FOOT LEFT   Final   Special Requests PT ON VANCO ZOSYN   Final   Gram Stain     Final   Value: NO WBC SEEN     NO SQUAMOUS EPITHELIAL CELLS SEEN     MODERATE GRAM POSITIVE COCCI IN PAIRS     Performed at [facility]   Culture     Final   Value: MODERATE PROTEUS MIRABILIS     Performed at [facility]   Report Status PENDING   Incomplete  CULTURE, BLOOD (ROUTINE X 2)     Status: None   Collection Time    09/03/13  10:00 PM      Result Value Range Status   Specimen Description BLOOD RIGHT HAND   Final   Special Requests BOTTLES DRAWN AEROBIC ONLY 3CC   Final   Culture  Setup Time     Final   Value: 09/04/2013 03:30     Performed at [facility]   Culture     Final   Value:        BLOOD CULTURE RECEIVED NO GROWTH TO DATE CULTURE WILL BE HELD FOR 5 DAYS BEFORE ISSUING A FINAL NEGATIVE REPORT     Performed at [facility]   Report Status PENDING   Incomplete  CULTURE, BLOOD (ROUTINE X 2)     Status: None   Collection Time    09/03/13 10:05 PM      Result Value Range Status   Specimen Description BLOOD LEFT HAND   Final   Special Requests BOTTLES DRAWN AEROBIC ONLY 4CC   Final   Culture  Setup Time     Final   Value: 09/04/2013 03:30     Performed at [facility]   Culture     Final   Value:        BLOOD CULTURE RECEIVED NO GROWTH TO DATE CULTURE WILL BE HELD FOR 5 DAYS BEFORE ISSUING A FINAL NEGATIVE REPORT     Performed at [facility]   Report Status PENDING   Incomplete    Assessment: 58 year old male with necrotizing foot infection, planned for amputation next week. Patient with low grade fever of 99.8 overnight, wbc elevated but trending down this morning at 14. Renal  function is stable. It appears patient missed dose of vancomycin on 2/3 subsequent dosing is charted.   Vancomycin trough this morning is therapeutic at 16. No dose adjustments needed at this time.   No dose adjustments warranted for zosyn or clinda.  2/1 wound cx - few proteus- pan sens 2/3 Abscess L foot>> proteus 2/3 BC X 2>>ngtd  Goal of Therapy:  Vancomycin 15-20  Plan:  Vancomycin 1g IV q12 hours Zosyn 3.375g IV q8 hours Clinda 600mg  q8 hours  Erin Hearing PharmD., BCPS Clinical Pharmacist Pager [phone] 09/06/2013 12:17 PM

## 2013-09-06 NOTE — Progress Notes (Signed)
Results for DELRON, TAIWO (MRN [id number]) as of 09/06/2013 14:54  Ref. Range 09/05/2013 19:05 09/05/2013 20:07 09/05/2013 21:56 09/06/2013 07:26 09/06/2013 11:41  Glucose-Capillary Latest Range: 70-99 mg/dL 137 (H) 149 (H) 178 (H) 264 (H) 296 (H)   Note: CBGs are rising.  Recommend increasing Lantus dosage to 25 units daily and continuing Novolog correction scale.  May need to titrate as needed.  At discharge, will need to be changed to more affordable insulin and will need follow up with physician.  Will continue to follow while in hospital.   Harvel Ricks RN BSN CDE

## 2013-09-06 NOTE — Progress Notes (Signed)
[facility] PROGRESS NOTE  [name] [id number] DOB: Apr 14, 1956 DOA: 09/01/2013 PCP: No PCP Per Patient  Brief narrative: [name] is a 58yo man with likely necrotizing infection of the left foot s/p Debridement on 2/3 by orthopedics.  He is on Antibiotics and plan appears to be BKA early next week.  He also has necrotic 1st, 2nd and 5th toes on the right.  Active Problems:  Suspected necrotizing infection of the left foot  - Currently on vancomycin, Zosyn and clindamycin - day 5 - MRI left foot confirmed necrotizing infection - Orthopedics is following with [place] - On 2/3 Underwent debridement, fifth ray amputation, fourth toe through metatarsal amputation and wound vac application - Wound vac in place - plan for BKA next week per Ortho  - White blood cell count slowly improving, 14 down from 17 today. - At rest ABI was normal  - Pain control with oral medications.   Black appearing toe-right foot, 1st, 2nd and 5th - At rest ABI WNL  Diabetic ketoacidosis  - Bicarb normal, gap 14 - He has been transitioned to lantus and SSI - A1c 12.2, this is a new diagnosis  - will require Insulin on discharge, Lantus now and insulin 70/30 on discharge  - DM coordinator consult noted  Hypokalemia  - K normal this AM with resolution of DKA, will get BMET this afternoon.  If stable can revert to once daily labs.   Hypophosphatemia  - Secondary to poor nutritional status, insulin infusion.  - Continue to supplement potassium as needed, watch for refeeding syndrome  - recheck PO4 in am (not done this morning, will repeat with PM labs)  Hypocalcemia - Cmet this afternoon to assess for hypoalbuminemia - he did have low albumin earlier in hospital stay, however, Ca was normal at this time.   DVT Prophylaxis:  SCD's    Consultants:  Orthopedics  PT/OT  DM Coordinator   Procedures: 2/3 by dr.Xu  1. Debridement of bone, muscle, skin, soft tissue.  2. Fifth ray amputation.   3. 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  4. Application of negative pressure wound therapy greater  Antibiotics:  Clindamycin - -> 2/2  Zosyn - -> 2/2  Vancomycin - -> 2/2  Code Status: Full Family Communication: Pt at bedside Disposition Plan: Will likely need SNF or CIR, pending surgery next week.   HPI/Subjective: No events overnight.  He is doing well.  Only complains of occasional sharp pain to left foot, resolved with pain medications.   Objective: Filed Vitals:   09/05/13 0816 09/05/13 1400 09/05/13 2129 09/06/13 0500  BP: 124/62 128/60 122/60 124/64  Pulse: 95 104 96 88  Temp: 99.7 F (37.6 C) 99.8 F (37.7 C) 99.6 F (37.6 C) 99.5 F (37.5 C)  TempSrc: Oral Oral Oral   Resp: 18 18 18 16   Height:      Weight:      SpO2: 96% 99% 98% 99%    Intake/Output Summary (Last 24 hours) at 09/06/13 1218 Last data filed at 09/06/13 0500  Gross per 24 hour  Intake      0 ml  Output   2750 ml  Net  -2750 ml    Exam:   General:  Pt is alert, follows commands appropriately, not in acute distress  Cardiovascular: Regular rate and rhythm, S1/S2  Respiratory: Clear to auscultation bilaterally, no wheezing, no crackles, no rhonchi  Abdomen: Soft, non tender, non distended, bowel sounds present  Extremities: No edema,  wound vac in place to LL foot, black 1st, 2nd and fifth toe on the right, pulses are palpable, foot is warm.   Neuro: Grossly nonfocal  Data Reviewed: Basic Metabolic Panel:  Recent Labs Lab 09/01/13 2200  09/02/13 0625 09/02/13 1010 09/02/13 1340 09/02/13 1840 09/02/13 2138  09/05/13 0604 09/05/13 0915 09/05/13 1310 09/05/13 1815 09/06/13 0609  NA 133*  --  134* 140 139 138 139  < > 144 141 141 140 141  K 3.8  --  3.0* 2.9* 2.9* 3.5* 3.3*  < > 3.5* 3.5* 3.5* 3.5* 4.2  CL 95*  --  99 106 105 105 106  < > 109 107 107 104 105  CO2 9*  --  13* 14* 16* 15* 14*  < > 19 20 20 21 22   GLUCOSE 594*  --  309* 176*  171* 125* 130*  < > 101* 170* 162* 137* 307*  BUN 28*  --  25* 23 20 18 18   < > 8 7 8 7 9   CREATININE 0.89  --  0.83 0.79 0.84 0.71 0.79  < > 0.97 1.00 1.01 1.01 1.05  CALCIUM 9.5  --  9.0 8.8 8.7 8.5 8.5  < > 7.2* 7.2* 7.2* 7.1* 7.2*  MG  --   --  2.5  --   --   --   --   --   --   --   --   --   --   PHOS  --   < > 0.6* 0.9* 1.3* 1.9* 2.3  --  1.7*  --   --   --   --   < > = values in this interval not displayed. Liver Function Tests:  Recent Labs Lab 09/02/13 0625  AST 12  ALT 9  ALKPHOS 148*  BILITOT 0.3  PROT 7.9  ALBUMIN 2.4*   CBC:  Recent Labs Lab 09/01/13 2015 09/02/13 0625 09/03/13 0300 09/05/13 0604 09/06/13 0609  WBC 26.8* 23.3* 22.3* 17.8* 14.4*  HGB 15.0 12.3* 12.3* 9.8* 9.8*  HCT 40.9 33.4* 32.6* 26.6* 26.9*  MCV 85.6 83.1 81.1 82.4 83.0  PLT 309 256 245 161 149*   CBG:  Recent Labs Lab 09/05/13 1905 09/05/13 2007 09/05/13 2156 09/06/13 0726 09/06/13 1141  GLUCAP 137* 149* 178* 264* 296*    Recent Results (from the past 240 hour(s))  WOUND CULTURE     Status: None   Collection Time    09/01/13  8:18 PM      Result Value Range Status   Specimen Description WOUND LEFT FOOT   Final   Special Requests NONE   Final   Gram Stain     Final   Value: NO WBC SEEN     NO SQUAMOUS EPITHELIAL CELLS SEEN     MODERATE GRAM POSITIVE COCCI IN PAIRS     MODERATE GRAM NEGATIVE RODS     Performed at [facility]   Culture     Final   Value: FEW PROTEUS MIRABILIS     Performed at [facility]   Report Status PENDING   Incomplete   Organism ID, Bacteria PROTEUS MIRABILIS   Final  ANAEROBIC CULTURE     Status: None   Collection Time    09/03/13 12:03 PM      Result Value Range Status   Specimen Description ABSCESS FOOT LEFT   Final   Special Requests PT ON VANCO ZOSYN   Final   Gram Stain  Final   Value: NO WBC SEEN     NO SQUAMOUS EPITHELIAL CELLS SEEN     MODERATE GRAM POSITIVE COCCI IN PAIRS     Performed at [facility]    Culture     Final   Value: NO ANAEROBES ISOLATED; CULTURE IN PROGRESS FOR 5 DAYS     Performed at [facility]   Report Status PENDING   Incomplete  CULTURE, ROUTINE-ABSCESS     Status: None   Collection Time    09/03/13 12:03 PM      Result Value Range Status   Specimen Description ABSCESS FOOT LEFT   Final   Special Requests PT ON VANCO ZOSYN   Final   Gram Stain     Final   Value: NO WBC SEEN     NO SQUAMOUS EPITHELIAL CELLS SEEN     MODERATE GRAM POSITIVE COCCI IN PAIRS     Performed at [facility]   Culture     Final   Value: MODERATE PROTEUS MIRABILIS     Performed at [facility]   Report Status PENDING   Incomplete  CULTURE, BLOOD (ROUTINE X 2)     Status: None   Collection Time    09/03/13 10:00 PM      Result Value Range Status   Specimen Description BLOOD RIGHT HAND   Final   Special Requests BOTTLES DRAWN AEROBIC ONLY 3CC   Final   Culture  Setup Time     Final   Value: 09/04/2013 03:30     Performed at [facility]   Culture     Final   Value:        BLOOD CULTURE RECEIVED NO GROWTH TO DATE CULTURE WILL BE HELD FOR 5 DAYS BEFORE ISSUING A FINAL NEGATIVE REPORT     Performed at [facility]   Report Status PENDING   Incomplete  CULTURE, BLOOD (ROUTINE X 2)     Status: None   Collection Time    09/03/13 10:05 PM      Result Value Range Status   Specimen Description BLOOD LEFT HAND   Final   Special Requests BOTTLES DRAWN AEROBIC ONLY 4CC   Final   Culture  Setup Time     Final   Value: 09/04/2013 03:30     Performed at [facility]   Culture     Final   Value:        BLOOD CULTURE RECEIVED NO GROWTH TO DATE CULTURE WILL BE HELD FOR 5 DAYS BEFORE ISSUING A FINAL NEGATIVE REPORT     Performed at [facility]   Report Status PENDING   Incomplete     Scheduled Meds: . clindamycin (CLEOCIN) IV  600 mg Intravenous Q8H  . feeding supplement (GLUCERNA SHAKE)  237 mL Oral BID BM  . insulin aspart  0-20  Units Subcutaneous TID WC  . insulin aspart  0-5 Units Subcutaneous QHS  . insulin glargine  15 Units Subcutaneous QHS  . piperacillin-tazobactam (ZOSYN)  IV  3.375 g Intravenous Q8H  . sodium chloride  3 mL Intravenous Q12H  . vancomycin  1,000 mg Intravenous Q12H   Continuous Infusions:    Gilles Chiquito, MD  [place] Pager [phone]  If 7PM-7AM, please contact night-coverage [email or web address] Password [email or web address] 09/06/2013, 12:18 PM   LOS: 5 days

## 2013-09-07 LAB — BASIC METABOLIC PANEL
BUN: 10 mg/dL (ref 6–23)
CO2: 25 mEq/L (ref 19–32)
Calcium: 7.5 mg/dL — ABNORMAL LOW (ref 8.4–10.5)
Chloride: 104 mEq/L (ref 96–112)
Creatinine, Ser: 1.06 mg/dL (ref 0.50–1.35)
GFR calc Af Amer: 89 mL/min — ABNORMAL LOW (ref >90)
GFR calc non Af Amer: 77 mL/min — ABNORMAL LOW (ref >90)
GLUCOSE: 230 mg/dL — AB (ref 70–99)
POTASSIUM: 3.9 meq/L (ref 3.7–5.3)
SODIUM: 143 meq/L (ref 137–147)

## 2013-09-07 LAB — WOUND CULTURE: Gram Stain: NONE SEEN

## 2013-09-07 LAB — CULTURE, ROUTINE-ABSCESS: GRAM STAIN: NONE SEEN

## 2013-09-07 LAB — GLUCOSE, CAPILLARY
GLUCOSE-CAPILLARY: 219 mg/dL — AB (ref 70–99)
Glucose-Capillary: 236 mg/dL — ABNORMAL HIGH (ref 70–99)
Glucose-Capillary: 224 mg/dL — ABNORMAL HIGH (ref 70–99)
Glucose-Capillary: 230 mg/dL — ABNORMAL HIGH (ref 70–99)

## 2013-09-07 MED ORDER — INSULIN GLARGINE 100 UNIT/ML ~~LOC~~ SOLN
25.0000 [IU] | Freq: Every day | SUBCUTANEOUS | Status: DC
  Administered 2013-09-07 – 2013-09-08 (×2): 25 [IU] via SUBCUTANEOUS
  Filled 2013-09-07 (×4): qty 0.25

## 2013-09-07 NOTE — Progress Notes (Signed)
[facility] PROGRESS NOTE  [name] [id number] DOB: [birth year] DOA: 09/01/2013 PCP: No PCP Per Patient  Brief narrative: [name] is a 58yo man with likely necrotizing infection of the left foot s/p Debridement on 2/3 by orthopedics. He is on Antibiotics and plan appears to be BKA early next week. He also has necrotic 1st, 2nd and 5th toes on the right.  Assessment/plan Suspected necrotizing infection of the left foot  - Currently on vancomycin, Zosyn and clindamycin - day 6 - MRI left foot confirmed necrotizing infection  - Orthopedics is following along - On 2/3 Underwent debridement, fifth ray amputation, fourth toe through metatarsal amputation and wound vac application  - Wound vac in place, draining reddish fluid - plan for BKA next week per Ortho  - White blood cell count slowly improving, CBC in AM - At rest ABI was normal  - Pain control with oral medications.  - BC X 2 from 2/3 are NGTD  Black appearing toe-right foot, 1st, 2nd and 5th  - At rest ABI WNL  - Monitor clinically for changes  Diabetic ketoacidosis  - Bicarb normalized, gap remains 14  - He has been transitioned to lantus and SSI, increase lantus to 25 Units today per DM coordinator rec - A1c 12.2 at admission, this is a new diagnosis  - will require Insulin on discharge, Lantus now and insulin 70/30 on discharge  - Will d/c telemetry today  Hypokalemia  - K remains normal today, resolved  Hypophosphatemia  - Normal at last check, resolved  PseudoHypocalcemia due to hypoalbuminemia - Corrected Ca 9.2  DVT Prophylaxis:  SCD's   Consultants:  Orthopedics  PT/OT  DM Coordinator   Procedures: 2/3 by dr.Xu  1. Debridement of bone, muscle, skin, soft tissue.  2. Fifth ray amputation.  3. 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  4. Application of negative pressure wound therapy greater   Antibiotics:  Clindamycin - -> 2/2  Zosyn - -> 2/2  Vancomycin - ->  2/2  Code Status: Full  Family Communication: Pt at bedside  Disposition Plan: Will likely need SNF or CIR, pending surgery next week.    HPI/Subjective: No events overnight.  Resting comfortably when I saw him, no complaints.   Objective: Filed Vitals:   09/06/13 0500 09/06/13 1356 09/06/13 2039 09/07/13 0633  BP: 124/64 117/68 124/68 120/71  Pulse: 88 106 94 94  Temp: 99.5 F (37.5 C) 99.5 F (37.5 C) 99.4 F (37.4 C) 99.4 F (37.4 C)  TempSrc:  Oral Oral Oral  Resp: 16 18 20 20   Height:      Weight:      SpO2: 99% 98% 95% 95%    Intake/Output Summary (Last 24 hours) at 09/07/13 1130 Last data filed at 09/07/13 0900  Gross per 24 hour  Intake    360 ml  Output   2225 ml  Net  -1865 ml    Exam:   General:  Pt is alert upon awakening, no distress  Cardiovascular: Regular rate and rhythm, S1/S2  Respiratory: Clear to auscultation bilaterally  Abdomen: Soft, non tender, non distended, bowel sounds present  Extremities: No edema, wound vac in place to LL foot draining red fluid, black 1st, 2nd and fifth toe on the right, pulses are palpable, foot is warm.  Neuro: Grossly nonfocal  Data Reviewed: Basic Metabolic Panel:  Recent Labs Lab 09/01/13 2200 09/02/13 0625  09/02/13 1340 09/02/13 1840 09/02/13 2138  09/05/13 0604  09/05/13 1310 09/05/13  1815 09/06/13 0609 09/06/13 1417 09/07/13 0403  NA 133* 134*  < > 139 138 139  < > 144  < > 141 140 141 141 143  K 3.8 3.0*  < > 2.9* 3.5* 3.3*  < > 3.5*  < > 3.5* 3.5* 4.2 3.7 3.9  CL 95* 99  < > 105 105 106  < > 109  < > 107 104 105 103 104  CO2 9* 13*  < > 16* 15* 14*  < > 19  < > 20 21 22 23 25   GLUCOSE 594* 309*  < > 171* 125* 130*  < > 101*  < > 162* 137* 307* 251* 230*  BUN 28* 25*  < > 20 18 18   < > 8  < > 8 7 9 11 10   CREATININE 0.89 0.83  < > 0.84 0.71 0.79  < > 0.97  < > 1.01 1.01 1.05 0.92 1.06  CALCIUM 9.5 9.0  < > 8.7 8.5 8.5  < > 7.2*  < > 7.2* 7.1* 7.2* 7.3* 7.5*  MG  --  2.5  --   --   --    --   --   --   --   --   --   --   --   --   PHOS  --  0.6*  < > 1.3* 1.9* 2.3  --  1.7*  --   --   --   --  3.0  --   < > = values in this interval not displayed. Liver Function Tests:  Recent Labs Lab 09/02/13 0625 09/06/13 1417  AST 12 16  ALT 9 14  ALKPHOS 148* 114  BILITOT 0.3 0.2*  PROT 7.9 6.3  ALBUMIN 2.4* 1.6*   CBC:  Recent Labs Lab 09/01/13 2015 09/02/13 0625 09/03/13 0300 09/05/13 0604 09/06/13 0609  WBC 26.8* 23.3* 22.3* 17.8* 14.4*  HGB 15.0 12.3* 12.3* 9.8* 9.8*  HCT 40.9 33.4* 32.6* 26.6* 26.9*  MCV 85.6 83.1 81.1 82.4 83.0  PLT 309 256 245 161 149*   CBG:  Recent Labs Lab 09/06/13 0726 09/06/13 1141 09/06/13 1643 09/06/13 2038 09/07/13 0742  GLUCAP 264* 296* 222* 219* 219*    Recent Results (from the past 240 hour(s))  WOUND CULTURE     Status: None   Collection Time    09/01/13  8:18 PM      Result Value Range Status   Specimen Description WOUND LEFT FOOT   Final   Special Requests NONE   Final   Gram Stain     Final   Value: NO WBC SEEN     NO SQUAMOUS EPITHELIAL CELLS SEEN     MODERATE GRAM POSITIVE COCCI IN PAIRS     MODERATE GRAM NEGATIVE RODS     Performed at [facility]   Culture     Final   Value: FEW PROTEUS MIRABILIS     Performed at [facility]   Report Status PENDING   Incomplete   Organism ID, Bacteria PROTEUS MIRABILIS   Final  ANAEROBIC CULTURE     Status: None   Collection Time    09/03/13 12:03 PM      Result Value Range Status   Specimen Description ABSCESS FOOT LEFT   Final   Special Requests PT ON VANCO ZOSYN   Final   Gram Stain     Final   Value: NO WBC SEEN     NO SQUAMOUS EPITHELIAL CELLS  SEEN     MODERATE GRAM POSITIVE COCCI IN PAIRS     Performed at [facility]   Culture     Final   Value: NO ANAEROBES ISOLATED; CULTURE IN PROGRESS FOR 5 DAYS     Performed at [facility]   Report Status PENDING   Incomplete  CULTURE, ROUTINE-ABSCESS     Status: None   Collection  Time    09/03/13 12:03 PM      Result Value Range Status   Specimen Description ABSCESS FOOT LEFT   Final   Special Requests PT ON VANCO ZOSYN   Final   Gram Stain     Final   Value: NO WBC SEEN     NO SQUAMOUS EPITHELIAL CELLS SEEN     MODERATE GRAM POSITIVE COCCI IN PAIRS     Performed at [facility]   Culture     Final   Value: MODERATE PROTEUS MIRABILIS     Performed at [facility]   Report Status 09/07/2013 FINAL   Final   Organism ID, Bacteria PROTEUS MIRABILIS   Final  CULTURE, BLOOD (ROUTINE X 2)     Status: None   Collection Time    09/03/13 10:00 PM      Result Value Range Status   Specimen Description BLOOD RIGHT HAND   Final   Special Requests BOTTLES DRAWN AEROBIC ONLY 3CC   Final   Culture  Setup Time     Final   Value: 09/04/2013 03:30     Performed at [facility]   Culture     Final   Value:        BLOOD CULTURE RECEIVED NO GROWTH TO DATE CULTURE WILL BE HELD FOR 5 DAYS BEFORE ISSUING A FINAL NEGATIVE REPORT     Performed at [facility]   Report Status PENDING   Incomplete  CULTURE, BLOOD (ROUTINE X 2)     Status: None   Collection Time    09/03/13 10:05 PM      Result Value Range Status   Specimen Description BLOOD LEFT HAND   Final   Special Requests BOTTLES DRAWN AEROBIC ONLY 4CC   Final   Culture  Setup Time     Final   Value: 09/04/2013 03:30     Performed at [facility]   Culture     Final   Value:        BLOOD CULTURE RECEIVED NO GROWTH TO DATE CULTURE WILL BE HELD FOR 5 DAYS BEFORE ISSUING A FINAL NEGATIVE REPORT     Performed at [facility]   Report Status PENDING   Incomplete     Scheduled Meds: . clindamycin (CLEOCIN) IV  600 mg Intravenous Q8H  . feeding supplement (GLUCERNA SHAKE)  237 mL Oral BID BM  . insulin aspart  0-20 Units Subcutaneous TID WC  . insulin aspart  0-5 Units Subcutaneous QHS  . insulin glargine  25 Units Subcutaneous QHS  . piperacillin-tazobactam (ZOSYN)  IV  3.375  g Intravenous Q8H  . sodium chloride  3 mL Intravenous Q12H  . vancomycin  1,000 mg Intravenous Q12H   Continuous Infusions:    Gilles Chiquito, MD  [place] Pager [phone]  If 7PM-7AM, please contact night-coverage [email or web address] Password [email or web address] 09/07/2013, 11:30 AM   LOS: 6 days

## 2013-09-07 NOTE — Progress Notes (Signed)
Pt stable Wound vac on l foot Foot perfused

## 2013-09-08 LAB — CBC
HEMATOCRIT: 26.9 % — AB (ref 39.0–52.0)
Hemoglobin: 9.3 g/dL — ABNORMAL LOW (ref 13.0–17.0)
MCH: 29.7 pg (ref 26.0–34.0)
MCHC: 34.6 g/dL (ref 30.0–36.0)
MCV: 85.9 fL (ref 78.0–100.0)
Platelets: 175 10*3/uL (ref 150–400)
RBC: 3.13 MIL/uL — ABNORMAL LOW (ref 4.22–5.81)
RDW: 13.9 % (ref 11.5–15.5)
WBC: 11.9 10*3/uL — AB (ref 4.0–10.5)

## 2013-09-08 LAB — GLUCOSE, CAPILLARY
GLUCOSE-CAPILLARY: 225 mg/dL — AB (ref 70–99)
GLUCOSE-CAPILLARY: 242 mg/dL — AB (ref 70–99)
GLUCOSE-CAPILLARY: 238 mg/dL — AB (ref 70–99)
Glucose-Capillary: 219 mg/dL — ABNORMAL HIGH (ref 70–99)

## 2013-09-08 LAB — BASIC METABOLIC PANEL
BUN: 13 mg/dL (ref 6–23)
CHLORIDE: 102 meq/L (ref 96–112)
CO2: 26 meq/L (ref 19–32)
Calcium: 7.9 mg/dL — ABNORMAL LOW (ref 8.4–10.5)
Creatinine, Ser: 1 mg/dL (ref 0.50–1.35)
GFR calc Af Amer: >90 mL/min (ref >90)
GFR calc non Af Amer: 83 mL/min — ABNORMAL LOW (ref >90)
Glucose, Bld: 243 mg/dL — ABNORMAL HIGH (ref 70–99)
POTASSIUM: 3.9 meq/L (ref 3.7–5.3)
Sodium: 141 mEq/L (ref 137–147)

## 2013-09-08 LAB — ANAEROBIC CULTURE: GRAM STAIN: NONE SEEN

## 2013-09-08 NOTE — Clinical Social Work Psychosocial (Signed)
Clinical Social Work Department BRIEF PSYCHOSOCIAL ASSESSMENT 09/08/2013  Patient:  [name], [name]     Account Number:  [id number]     Admit date:  09/01/2013  Clinical Social Worker:  Lovey Newcomer  Date/Time:  09/08/2013 12:58 PM  Referred by:  Physician  Date Referred:  09/08/2013 Referred for  SNF Placement   Other Referral:   Interview type:  Patient Other interview type:   Patient alert and oriented at time of assessment.    PSYCHOSOCIAL DATA Living Status:  FAMILY Admitted from facility:   Level of care:   Primary support name:  Darran Gabay Primary support relationship to patient:  SIBLING Degree of support available:   Support is adequate.    CURRENT CONCERNS Current Concerns  Post-Acute Placement   Other Concerns:    SOCIAL WORK ASSESSMENT / PLAN CSW met with patient at bedside to discuss SNF plan for backup to CIR. Patient states that he is agreeable to SNF placement if CIR admission is not possible. CSW explained to patient that since he does not have insurance that his SNF options will be limited and that we cannot guarantee a placement in [facility]. Patient states that he is agreeable to also looking beyond [facility]. Patient states that he would just want to know about the facility before going. CSW explained that he would need to have some go and visit the facilities for him while he remained in the hospital. Patient verbalized understanding.   Assessment/plan status:  Psychosocial Support/Ongoing Assessment of Needs Other assessment/ plan:   Complete FL2, Fax, PASRR   Information/referral to community resources:   CSW contact information given to patient.    PATIENT'S/FAMILY'S RESPONSE TO PLAN OF CARE: Patient is agreeable to having SNF plan as backup to CIR. Patient was pleasant, appropriate, and appreciative of CSW contact. Patient waits for a bed offer. CSW will assist with DC.       Liz Beach, [place], [place],  [phone]

## 2013-09-08 NOTE — Progress Notes (Signed)
ANTIBIOTIC CONSULT NOTE - FOLLOW UP  Pharmacy Consult for vancomycin  Indication: Suspected necrotizing infection of left foot  No Known Allergies  Patient Measurements: Height: 5\' 3"  (160 cm) Weight: 148 lb (67.132 kg) IBW/kg (Calculated) : 56.9  Vital Signs: Temp: 99.5 F (37.5 C) (02/08 0603) Temp src: Oral (02/07 2100) BP: 129/65 mmHg (02/08 0603) Pulse Rate: 83 (02/08 0603) Intake/Output from previous day: 02/07 0701 - 02/08 0700 In: 1000 [P.O.:1000] Out: 2701 [Urine:2650; Drains:50; Stool:1] Intake/Output from this shift:    Labs:  Recent Labs  09/06/13 0609 09/06/13 1417 09/07/13 0403 09/08/13 0425  WBC 14.4*  --   --  11.9*  HGB 9.8*  --   --  9.3*  PLT 149*  --   --  175  CREATININE 1.05 0.92 1.06 1.00   Estimated Creatinine Clearance: 67.2 ml/min (by C-G formula based on Cr of 1).  Recent Labs  09/06/13 1020  VANCOTROUGH 16.5     Microbiology: Recent Results (from the past 720 hour(s))  WOUND CULTURE     Status: None   Collection Time    09/01/13  8:18 PM      Result Value Range Status   Specimen Description WOUND LEFT FOOT   Final   Special Requests NONE   Final   Gram Stain     Final   Value: NO WBC SEEN     NO SQUAMOUS EPITHELIAL CELLS SEEN     MODERATE GRAM POSITIVE COCCI IN PAIRS     MODERATE GRAM NEGATIVE RODS     Performed at [facility]   Culture     Final   Value: FEW PROTEUS MIRABILIS     MODERATE STAPHYLOCOCCUS SPECIES (COAGULASE NEGATIVE)     Performed at [facility]   Report Status 09/07/2013 FINAL   Final   Organism ID, Bacteria PROTEUS MIRABILIS   Final  ANAEROBIC CULTURE     Status: None   Collection Time    09/03/13 12:03 PM      Result Value Range Status   Specimen Description ABSCESS FOOT LEFT   Final   Special Requests PT ON VANCO ZOSYN   Final   Gram Stain     Final   Value: NO WBC SEEN     NO SQUAMOUS EPITHELIAL CELLS SEEN     MODERATE GRAM POSITIVE COCCI IN PAIRS     Performed at [facility]   Culture     Final   Value: NO ANAEROBES ISOLATED; CULTURE IN PROGRESS FOR 5 DAYS     Performed at [facility]   Report Status PENDING   Incomplete  CULTURE, ROUTINE-ABSCESS     Status: None   Collection Time    09/03/13 12:03 PM      Result Value Range Status   Specimen Description ABSCESS FOOT LEFT   Final   Special Requests PT ON VANCO ZOSYN   Final   Gram Stain     Final   Value: NO WBC SEEN     NO SQUAMOUS EPITHELIAL CELLS SEEN     MODERATE GRAM POSITIVE COCCI IN PAIRS     Performed at [facility]   Culture     Final   Value: MODERATE PROTEUS MIRABILIS     Performed at [facility]   Report Status 09/07/2013 FINAL   Final   Organism ID, Bacteria PROTEUS MIRABILIS   Final  CULTURE, BLOOD (ROUTINE X 2)     Status: None  Collection Time    09/03/13 10:00 PM      Result Value Range Status   Specimen Description BLOOD RIGHT HAND   Final   Special Requests BOTTLES DRAWN AEROBIC ONLY 3CC   Final   Culture  Setup Time     Final   Value: 09/04/2013 03:30     Performed at [facility]   Culture     Final   Value:        BLOOD CULTURE RECEIVED NO GROWTH TO DATE CULTURE WILL BE HELD FOR 5 DAYS BEFORE ISSUING A FINAL NEGATIVE REPORT     Performed at [facility]   Report Status PENDING   Incomplete  CULTURE, BLOOD (ROUTINE X 2)     Status: None   Collection Time    09/03/13 10:05 PM      Result Value Range Status   Specimen Description BLOOD LEFT HAND   Final   Special Requests BOTTLES DRAWN AEROBIC ONLY 4CC   Final   Culture  Setup Time     Final   Value: 09/04/2013 03:30     Performed at [facility]   Culture     Final   Value:        BLOOD CULTURE RECEIVED NO GROWTH TO DATE CULTURE WILL BE HELD FOR 5 DAYS BEFORE ISSUING A FINAL NEGATIVE REPORT     Performed at [facility]   Report Status PENDING   Incomplete    Assessment: 58 year old male with necrotizing foot infection, planned for BKA this week.  Tmax 99.6, wbc 11 (down) Vancomycin trough 2/6 was therapeutic at 16. No dose adjustments needed at this time.   No dose adjustments warranted for zosyn or clinda. Would limit length of therapy of clinda dosing to reduce risk of cdiff and does not likely add much additional coverage to vancomycin and zosyn.   2/1 wound cx - few proteus- pan sens, moderate coag negative staph 2/3 Abscess L foot>> proteus - pan sens 2/3 BC X 2>>ngtd  Goal of Therapy:  Vancomycin 15-20  Plan:  Vancomycin 1g IV q12 hours Zosyn 3.375g IV q8 hours Clinda 600mg  q8 hours  Erin Hearing PharmD., BCPS Clinical Pharmacist Pager [phone] 09/08/2013 8:18 AM

## 2013-09-08 NOTE — Progress Notes (Signed)
[facility] PROGRESS NOTE  [name] HR:[id number] DOB: 08/01/1956 DOA: 09/01/2013 PCP: No PCP Per Patient  Brief narrative: [name] is a 58yo man with likely necrotizing infection of the left foot s/p Debridement on 2/3 by orthopedics. He is on Antibiotics and plan appears to be BKA early next week. He also has necrotic 1st, 2nd and 5th toes on the right.  Assessment/plan Suspected necrotizing infection of the left foot  - Currently on vancomycin, Zosyn and clindamycin - day 7 - MRI left foot confirmed necrotizing infection  - Orthopedics is following along - On 2/3 Underwent debridement, fifth ray amputation, fourth toe through metatarsal amputation and wound vac application  - Wound vac in place, draining reddish fluid - plan for BKA next week per Ortho  - WBC trending down.  - At rest ABI was normal  - Pain control with oral medications.  - BC X 2 from 2/3 are NGTD  Black appearing toe-right foot, 1st, 2nd and 5th  - At rest ABI WNL  - Monitor clinically for changes  Diabetic ketoacidosis  - Bicarb normalized.  - He has been transitioned to lantus and SSI, Lantus.  - A1c 12.2 at admission, this is a new diagnosis  - will require Insulin on discharge.   Hypokalemia  - K remains normal today, resolved  Hypophosphatemia  - Normal at last check, resolved  PseudoHypocalcemia due to hypoalbuminemia - Corrected Ca 9.2  DVT Prophylaxis:  SCD's   Consultants:  Orthopedics  PT/OT  DM Coordinator   Procedures: 2/3 by dr.Xu  1. Debridement of bone, muscle, skin, soft tissue.  2. Fifth ray amputation.  3. 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  4. Application of negative pressure wound therapy greater   Antibiotics:  Clindamycin - -> 2/2  Zosyn - -> 2/2  Vancomycin - -> 2/2  Code Status: Full  Family Communication: Pt at bedside  Disposition Plan: Will likely need SNF or CIR, pending surgery next week.    HPI/Subjective: No  events overnight. No complaints.   Objective: Filed Vitals:   09/07/13 [id number] 09/07/13 1342 09/07/13 2100 09/08/13 0603  BP: 120/71 120/62 121/57 129/65  Pulse: 94 94 87 83  Temp: 99.4 F (37.4 C) 99.6 F (37.6 C) 99.6 F (37.6 C) 99.5 F (37.5 C)  TempSrc: Oral Oral Oral   Resp: 20 18 15 15   Height:      Weight:      SpO2: 95% 95% 95% 95%    Intake/Output Summary (Last 24 hours) at 09/08/13 1330 Last data filed at 09/08/13 1130  Gross per 24 hour  Intake    720 ml  Output   2051 ml  Net  -1331 ml    Exam:   General:  Pt is alert upon awakening, no distress  Cardiovascular: Regular rate and rhythm, S1/S2  Respiratory: Clear to auscultation bilaterally  Abdomen: Soft, non tender, non distended, bowel sounds present  Extremities: No edema, wound vac in place to LL foot draining red fluid, black 1st, 2nd and fifth toe on the right, pulses are palpable, foot is warm.  Neuro: Grossly nonfocal  Data Reviewed: Basic Metabolic Panel:  Recent Labs Lab 09/01/13 2200 09/02/13 0625  09/02/13 1340 09/02/13 1840 09/02/13 2138  09/05/13 0604  09/05/13 1815 09/06/13 0609 09/06/13 1417 09/07/13 0403 09/08/13 0425  NA 133* 134*  < > 139 138 139  < > 144  < > 140 141 141 143 141  K 3.8 3.0*  < >  2.9* 3.5* 3.3*  < > 3.5*  < > 3.5* 4.2 3.7 3.9 3.9  CL 95* 99  < > 105 105 106  < > 109  < > 104 105 103 104 102  CO2 9* 13*  < > 16* 15* 14*  < > 19  < > 21 22 23 25 26   GLUCOSE 594* 309*  < > 171* 125* 130*  < > 101*  < > 137* 307* 251* 230* 243*  BUN 28* 25*  < > 20 18 18   < > 8  < > 7 9 11 10 13   CREATININE 0.89 0.83  < > 0.84 0.71 0.79  < > 0.97  < > 1.01 1.05 0.92 1.06 1.00  CALCIUM 9.5 9.0  < > 8.7 8.5 8.5  < > 7.2*  < > 7.1* 7.2* 7.3* 7.5* 7.9*  MG  --  2.5  --   --   --   --   --   --   --   --   --   --   --   --   PHOS  --  0.6*  < > 1.3* 1.9* 2.3  --  1.7*  --   --   --  3.0  --   --   < > = values in this interval not displayed. Liver Function Tests:  Recent  Labs Lab 09/02/13 0625 09/06/13 1417  AST 12 16  ALT 9 14  ALKPHOS 148* 114  BILITOT 0.3 0.2*  PROT 7.9 6.3  ALBUMIN 2.4* 1.6*   CBC:  Recent Labs Lab 09/02/13 0625 09/03/13 0300 09/05/13 0604 09/06/13 0609 09/08/13 0425  WBC 23.3* 22.3* 17.8* 14.4* 11.9*  HGB 12.3* 12.3* 9.8* 9.8* 9.3*  HCT 33.4* 32.6* 26.6* 26.9* 26.9*  MCV 83.1 81.1 82.4 83.0 85.9  PLT 256 245 161 149* 175   CBG:  Recent Labs Lab 09/07/13 1152 09/07/13 1616 09/07/13 2006 09/08/13 0807 09/08/13 1128  GLUCAP 236* 224* 230* 225* 219*    Recent Results (from the past 240 hour(s))  WOUND CULTURE     Status: None   Collection Time    09/01/13  8:18 PM      Result Value Range Status   Specimen Description WOUND LEFT FOOT   Final   Special Requests NONE   Final   Gram Stain     Final   Value: NO WBC SEEN     NO SQUAMOUS EPITHELIAL CELLS SEEN     MODERATE GRAM POSITIVE COCCI IN PAIRS     MODERATE GRAM NEGATIVE RODS     Performed at [facility]   Culture     Final   Value: FEW PROTEUS MIRABILIS     MODERATE STAPHYLOCOCCUS SPECIES (COAGULASE NEGATIVE)     Performed at [facility]   Report Status 09/07/2013 FINAL   Final   Organism ID, Bacteria PROTEUS MIRABILIS   Final  ANAEROBIC CULTURE     Status: None   Collection Time    09/03/13 12:03 PM      Result Value Range Status   Specimen Description ABSCESS FOOT LEFT   Final   Special Requests PT ON VANCO ZOSYN   Final   Gram Stain     Final   Value: NO WBC SEEN     NO SQUAMOUS EPITHELIAL CELLS SEEN     MODERATE GRAM POSITIVE COCCI IN PAIRS     Performed at [facility]  Final   Value: NO ANAEROBES ISOLATED     Performed at [facility]   Report Status 09/08/2013 FINAL   Final  CULTURE, ROUTINE-ABSCESS     Status: None   Collection Time    09/03/13 12:03 PM      Result Value Range Status   Specimen Description ABSCESS FOOT LEFT   Final   Special Requests PT ON VANCO ZOSYN   Final   Gram  Stain     Final   Value: NO WBC SEEN     NO SQUAMOUS EPITHELIAL CELLS SEEN     MODERATE GRAM POSITIVE COCCI IN PAIRS     Performed at [facility]   Culture     Final   Value: MODERATE PROTEUS MIRABILIS     Performed at [facility]   Report Status 09/07/2013 FINAL   Final   Organism ID, Bacteria PROTEUS MIRABILIS   Final  CULTURE, BLOOD (ROUTINE X 2)     Status: None   Collection Time    09/03/13 10:00 PM      Result Value Range Status   Specimen Description BLOOD RIGHT HAND   Final   Special Requests BOTTLES DRAWN AEROBIC ONLY 3CC   Final   Culture  Setup Time     Final   Value: 09/04/2013 03:30     Performed at [facility]   Culture     Final   Value:        BLOOD CULTURE RECEIVED NO GROWTH TO DATE CULTURE WILL BE HELD FOR 5 DAYS BEFORE ISSUING A FINAL NEGATIVE REPORT     Performed at [facility]   Report Status PENDING   Incomplete  CULTURE, BLOOD (ROUTINE X 2)     Status: None   Collection Time    09/03/13 10:05 PM      Result Value Range Status   Specimen Description BLOOD LEFT HAND   Final   Special Requests BOTTLES DRAWN AEROBIC ONLY 4CC   Final   Culture  Setup Time     Final   Value: 09/04/2013 03:30     Performed at [facility]   Culture     Final   Value:        BLOOD CULTURE RECEIVED NO GROWTH TO DATE CULTURE WILL BE HELD FOR 5 DAYS BEFORE ISSUING A FINAL NEGATIVE REPORT     Performed at [facility]   Report Status PENDING   Incomplete     Scheduled Meds: . clindamycin (CLEOCIN) IV  600 mg Intravenous Q8H  . feeding supplement (GLUCERNA SHAKE)  237 mL Oral BID BM  . insulin aspart  0-20 Units Subcutaneous TID WC  . insulin aspart  0-5 Units Subcutaneous QHS  . insulin glargine  25 Units Subcutaneous QHS  . piperacillin-tazobactam (ZOSYN)  IV  3.375 g Intravenous Q8H  . sodium chloride  3 mL Intravenous Q12H  . vancomycin  1,000 mg Intravenous Q12H   Continuous Infusions:    Ayani Ospina,  MD  [facility] Pager [phone]  If 7PM-7AM, please contact night-coverage [email or web address] Password [email or web address] 09/08/2013, 1:30 PM   LOS: 7 days

## 2013-09-08 NOTE — Clinical Social Work Placement (Signed)
Clinical Social Work Department CLINICAL SOCIAL WORK PLACEMENT NOTE 09/08/2013  Patient:  ADESH, FREIDEL  Account Number:  [id number] Admit date:  09/01/2013  Clinical Social Worker:  Kemper Durie, [place]  Date/time:  09/08/2013 01:23 PM  Clinical Social Work is seeking post-discharge placement for this patient at the following level of care:   SKILLED NURSING   (*CSW will update this form in Epic as items are completed)     Patient/family provided with [place] Department of Clinical Social Work's list of facilities offering this level of care within the geographic area requested by the patient (or if unable, by the patient's family).    Patient/family informed of their freedom to choose among providers that offer the needed level of care, that participate in Medicare, Medicaid or managed care program needed by the patient, have an available bed and are willing to accept the patient.    Patient/family informed of [facility]' ownership interest in [facility], as well as of the fact that they are under no obligation to receive care at this facility.  PASARR submitted to EDS on 09/08/2013 PASARR number received from EDS on 09/08/2013  FL2 transmitted to all facilities in geographic area requested by pt/family on  09/08/2013 FL2 transmitted to all facilities within larger geographic area on 09/08/2013  Patient informed that his/her managed care company has contracts with or will negotiate with  certain facilities, including the following:     Patient/family informed of bed offers received:   Patient chooses bed at  Physician recommends and patient chooses bed at    Patient to be transferred to  on   Patient to be transferred to facility by   The following physician request were entered in Epic:   Additional Comments:    Liz Beach, [place], [place], JI:[id number]

## 2013-09-09 ENCOUNTER — Encounter (HOSPITAL_COMMUNITY): Payer: Self-pay | Admitting: Orthopaedic Surgery

## 2013-09-09 LAB — BASIC METABOLIC PANEL
BUN: 13 mg/dL (ref 6–23)
CALCIUM: 8.1 mg/dL — AB (ref 8.4–10.5)
CO2: 26 meq/L (ref 19–32)
CREATININE: 0.98 mg/dL (ref 0.50–1.35)
Chloride: 103 mEq/L (ref 96–112)
Glucose, Bld: 217 mg/dL — ABNORMAL HIGH (ref 70–99)
Potassium: 3.8 mEq/L (ref 3.7–5.3)
SODIUM: 142 meq/L (ref 137–147)

## 2013-09-09 LAB — CBC
HCT: 27.2 % — ABNORMAL LOW (ref 39.0–52.0)
Hemoglobin: 9.6 g/dL — ABNORMAL LOW (ref 13.0–17.0)
MCH: 30.5 pg (ref 26.0–34.0)
MCHC: 35.3 g/dL (ref 30.0–36.0)
MCV: 86.3 fL (ref 78.0–100.0)
Platelets: 220 10*3/uL (ref 150–400)
RBC: 3.15 MIL/uL — ABNORMAL LOW (ref 4.22–5.81)
RDW: 13.4 % (ref 11.5–15.5)
WBC: 12 10*3/uL — ABNORMAL HIGH (ref 4.0–10.5)

## 2013-09-09 LAB — GLUCOSE, CAPILLARY
GLUCOSE-CAPILLARY: 279 mg/dL — AB (ref 70–99)
GLUCOSE-CAPILLARY: 156 mg/dL — AB (ref 70–99)
Glucose-Capillary: 200 mg/dL — ABNORMAL HIGH (ref 70–99)
Glucose-Capillary: 166 mg/dL — ABNORMAL HIGH (ref 70–99)

## 2013-09-09 MED ORDER — LIVING WELL WITH DIABETES BOOK
Freq: Once | Status: AC
  Administered 2013-09-10: 07:00:00
  Filled 2013-09-09 (×2): qty 1

## 2013-09-09 MED ORDER — INSULIN GLARGINE 100 UNIT/ML ~~LOC~~ SOLN
28.0000 [IU] | Freq: Every day | SUBCUTANEOUS | Status: DC
  Administered 2013-09-09: 28 [IU] via SUBCUTANEOUS
  Filled 2013-09-09 (×2): qty 0.28

## 2013-09-09 MED ORDER — "BD GETTING STARTED TAKE HOME KIT: 1ML X 30 G SYRINGES, "
1.0000 | Freq: Once | Status: AC
  Administered 2013-09-10: 1
  Filled 2013-09-09: qty 1

## 2013-09-09 NOTE — Progress Notes (Signed)
[facility] PROGRESS NOTE  Jaevon Paras NLZ:[id number] DOB: 02-14-1956 DOA: 09/01/2013 PCP: No PCP Per Patient  Brief narrative: Mr. Leichter is a 58yo man with likely necrotizing infection of the left foot s/p Debridement on 2/3 by orthopedics. He is on Antibiotics and plan appears to be BKA early next week. He also has necrotic 1st, 2nd and 5th toes on the right.  Assessment/plan Suspected necrotizing infection of the left foot  - Currently on vancomycin, Zosyn and clindamycin - day 7 - MRI left foot confirmed necrotizing infection  - Orthopedics is following along - On 2/3 Underwent debridement, fifth ray amputation, fourth toe through metatarsal amputation and wound vac application  - Wound vac in place, draining reddish fluid - plan for BKA , ID on Wednesday.  - WBC trending down.  - At rest ABI was normal  - Pain control with oral medications.  - BC X 2 from 2/3 are NGTD  Black appearing toe-right foot, 1st, 2nd and 5th  - At rest ABI WNL  - Monitor clinically for changes -Per ortho.   Diabetic ketoacidosis  - Bicarb normalized.  - He has been transitioned to lantus and SSI, Lantus.  - A1c 12.2 at admission, this is a new diagnosis  - will require Insulin on discharge.  -Increase lantus to 28 units.    Hypokalemia  - K remains normal today, resolved  Hypophosphatemia  - Normal at last check, resolved  PseudoHypocalcemia due to hypoalbuminemia - Corrected Ca 9.2  DVT Prophylaxis:  SCD's   Consultants:  Orthopedics  PT/OT  DM Coordinator   Procedures: 2/3 by dr.Xu  1. Debridement of bone, muscle, skin, soft tissue.  2. Fifth ray amputation.  3. 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  4. Application of negative pressure wound therapy greater   Antibiotics:  Clindamycin - -> 2/2  Zosyn - -> 2/2  Vancomycin - -> 2/2  Code Status: Full  Family Communication: Pt at bedside  Disposition Plan: Will likely need SNF or CIR,  pending surgery next week.    HPI/Subjective: No events overnight. No complaints.   Objective: Filed Vitals:   09/08/13 1400 09/08/13 2100 09/09/13 0521 09/09/13 1304  BP: 135/68 132/59 130/69 142/67  Pulse: 91 87 85 94  Temp: 98.9 F (37.2 C) 99.5 F (37.5 C) 98.3 F (36.8 C) 98.9 F (37.2 C)  TempSrc: Oral     Resp: $Remo'16 15 15 16  'jgpsS$ Height:      Weight:      SpO2: 96% 95% 93% 94%    Intake/Output Summary (Last 24 hours) at 09/09/13 1552 Last data filed at 09/09/13 0500  Gross per 24 hour  Intake    360 ml  Output   1276 ml  Net   -916 ml    Exam:   General:  Pt is alert upon awakening, no distress  Cardiovascular: Regular rate and rhythm, S1/S2  Respiratory: Clear to auscultation bilaterally  Abdomen: Soft, non tender, non distended, bowel sounds present  Extremities: No edema, wound vac in place to LL foot draining red fluid, black 1st, 2nd and fifth toe on the right, pulses are palpable, foot is warm.  Neuro: Grossly nonfocal  Data Reviewed: Basic Metabolic Panel:  Recent Labs Lab 09/02/13 1840 09/02/13 2138  09/05/13 0604  09/06/13 0609 09/06/13 1417 09/07/13 0403 09/08/13 0425 09/09/13 0700  NA 138 139  < > 144  < > 141 141 143 141 142  K 3.5* 3.3*  < > 3.5*  < >  4.2 3.7 3.9 3.9 3.8  CL 105 106  < > 109  < > 105 103 104 102 103  CO2 15* 14*  < > 19  < > $R'22 23 25 26 26  'fe$ GLUCOSE 125* 130*  < > 101*  < > 307* 251* 230* 243* 217*  BUN 18 18  < > 8  < > $R'9 11 10 13 13  'CC$ CREATININE 0.71 0.79  < > 0.97  < > 1.05 0.92 1.06 1.00 0.98  CALCIUM 8.5 8.5  < > 7.2*  < > 7.2* 7.3* 7.5* 7.9* 8.1*  PHOS 1.9* 2.3  --  1.7*  --   --  3.0  --   --   --   < > = values in this interval not displayed. Liver Function Tests:  Recent Labs Lab 09/06/13 1417  AST 16  ALT 14  ALKPHOS 114  BILITOT 0.2*  PROT 6.3  ALBUMIN 1.6*   CBC:  Recent Labs Lab 09/03/13 0300 09/05/13 0604 09/06/13 0609 09/08/13 0425 09/09/13 0700  WBC 22.3* 17.8* 14.4* 11.9* 12.0*   HGB 12.3* 9.8* 9.8* 9.3* 9.6*  HCT 32.6* 26.6* 26.9* 26.9* 27.2*  MCV 81.1 82.4 83.0 85.9 86.3  PLT 245 161 149* 175 220   CBG:  Recent Labs Lab 09/08/13 1128 09/08/13 1625 09/08/13 2106 09/09/13 0734 09/09/13 1141  GLUCAP 219* 242* 238* 200* 279*    Recent Results (from the past 240 hour(s))  WOUND CULTURE     Status: None   Collection Time    09/01/13  8:18 PM      Result Value Range Status   Specimen Description WOUND LEFT FOOT   Final   Special Requests NONE   Final   Gram Stain     Final   Value: NO WBC SEEN     NO SQUAMOUS EPITHELIAL CELLS SEEN     MODERATE GRAM POSITIVE COCCI IN PAIRS     MODERATE GRAM NEGATIVE RODS     Performed at [facility]   Culture     Final   Value: FEW PROTEUS MIRABILIS     MODERATE STAPHYLOCOCCUS SPECIES (COAGULASE NEGATIVE)     Performed at [facility]   Report Status 09/07/2013 FINAL   Final   Organism ID, Bacteria PROTEUS MIRABILIS   Final  ANAEROBIC CULTURE     Status: None   Collection Time    09/03/13 12:03 PM      Result Value Range Status   Specimen Description ABSCESS FOOT LEFT   Final   Special Requests PT ON VANCO ZOSYN   Final   Gram Stain     Final   Value: NO WBC SEEN     NO SQUAMOUS EPITHELIAL CELLS SEEN     MODERATE GRAM POSITIVE COCCI IN PAIRS     Performed at [facility]   Culture     Final   Value: NO ANAEROBES ISOLATED     Performed at [facility]   Report Status 09/08/2013 FINAL   Final  CULTURE, ROUTINE-ABSCESS     Status: None   Collection Time    09/03/13 12:03 PM      Result Value Range Status   Specimen Description ABSCESS FOOT LEFT   Final   Special Requests PT ON VANCO ZOSYN   Final   Gram Stain     Final   Value: NO WBC SEEN     NO SQUAMOUS EPITHELIAL CELLS SEEN     MODERATE  GRAM POSITIVE COCCI IN PAIRS     Performed at [facility]   Culture     Final   Value: MODERATE PROTEUS MIRABILIS     Performed at [facility]   Report Status  09/07/2013 FINAL   Final   Organism ID, Bacteria PROTEUS MIRABILIS   Final  CULTURE, BLOOD (ROUTINE X 2)     Status: None   Collection Time    09/03/13 10:00 PM      Result Value Range Status   Specimen Description BLOOD RIGHT HAND   Final   Special Requests BOTTLES DRAWN AEROBIC ONLY 3CC   Final   Culture  Setup Time     Final   Value: 09/04/2013 03:30     Performed at [facility]   Culture     Final   Value:        BLOOD CULTURE RECEIVED NO GROWTH TO DATE CULTURE WILL BE HELD FOR 5 DAYS BEFORE ISSUING A FINAL NEGATIVE REPORT     Performed at [facility]   Report Status PENDING   Incomplete  CULTURE, BLOOD (ROUTINE X 2)     Status: None   Collection Time    09/03/13 10:05 PM      Result Value Range Status   Specimen Description BLOOD LEFT HAND   Final   Special Requests BOTTLES DRAWN AEROBIC ONLY 4CC   Final   Culture  Setup Time     Final   Value: 09/04/2013 03:30     Performed at [facility]   Culture     Final   Value:        BLOOD CULTURE RECEIVED NO GROWTH TO DATE CULTURE WILL BE HELD FOR 5 DAYS BEFORE ISSUING A FINAL NEGATIVE REPORT     Performed at [facility]   Report Status PENDING   Incomplete     Scheduled Meds: . bd getting started take home kit  1 kit Other Once  . clindamycin (CLEOCIN) IV  600 mg Intravenous Q8H  . feeding supplement (GLUCERNA SHAKE)  237 mL Oral BID BM  . insulin aspart  0-20 Units Subcutaneous TID WC  . insulin aspart  0-5 Units Subcutaneous QHS  . insulin glargine  25 Units Subcutaneous QHS  . living well with diabetes book   Does not apply Once  . piperacillin-tazobactam (ZOSYN)  IV  3.375 g Intravenous Q8H  . sodium chloride  3 mL Intravenous Q12H  . vancomycin  1,000 mg Intravenous Q12H   Continuous Infusions:    Arieon Corcoran, MD  [facility] Pager [phone]  If 7PM-7AM, please contact night-coverage [email or web address] Password [email or web address] 09/09/2013, 3:52 PM   LOS: 8 days

## 2013-09-09 NOTE — Progress Notes (Signed)
Inpatient Diabetes Program Recommendations  AACE/ADA: New Consensus Statement on Inpatient Glycemic Control (2013)  Target Ranges:  Prepandial:   less than 140 mg/dL      Peak postprandial:   less than 180 mg/dL (1-2 hours)      Critically ill patients:  140 - 180 mg/dL     Results for [name], [name] (MRN [id number]) as of 09/09/2013 10:37  Ref. Range 09/07/2013 07:42 09/07/2013 11:52 09/07/2013 16:16 09/07/2013 20:06  Glucose-Capillary Latest Range: 70-99 mg/dL 219 (H) 236 (H) 224 (H) 230 (H)    Results for [name], [name] (MRN [id number]) as of 09/09/2013 10:37  Ref. Range 09/08/2013 08:07 09/08/2013 11:28 09/08/2013 16:25 09/08/2013 21:06  Glucose-Capillary Latest Range: 70-99 mg/dL 225 (H) 219 (H) 242 (H) 238 (H)     **Patient having elevated fasting glucose levels and elevated postprandial glucose levels.  Needs improved CBG control for foot healing.  **Eating 60-90% of meals   **MD- Please consider the following in-hospital insulin adjustments:  1. Increase Lantus to 30 units QHS 2. Add Novolog meal coverage- Novolog 4 units tid with meals   Will follow. Wyn Quaker RN, MSN, CDE Diabetes Coordinator Inpatient Diabetes Program Team Pager: [phone] (8a-10p)

## 2013-09-09 NOTE — Progress Notes (Signed)
   Subjective:  No events.    Objective:   VITALS:   Filed Vitals:   09/08/13 0603 09/08/13 1400 09/08/13 2100 09/09/13 0521  BP: 129/65 135/68 132/59 130/69  Pulse: 83 91 87 85  Temp: 99.5 F (37.5 C) 98.9 F (37.2 C) 99.5 F (37.5 C) 98.3 F (36.8 C)  TempSrc:  Oral    Resp: 15 16 15 15   Height:      Weight:      SpO2: 95% 96% 95% 93%    Left foot: - VAC with good seal and suction - no clinical worsening  Right foot: - great toe, little toe appear to be gangrenous - dry, no drainage    Lab Results  Component Value Date   WBC 12.0* 09/09/2013   HGB 9.6* 09/09/2013   HCT 27.2* 09/09/2013   MCV 86.3 09/09/2013   PLT 220 09/09/2013     Assessment/Plan: 6 Days Post-Op   Problem List Items Addressed This Visit     Endocrine   DM (diabetes mellitus) type II controlled, neurological manifestation   Relevant Medications      insulin aspart (novoLOG) injection 0-20 Units      insulin aspart (novoLOG) injection 0-5 Units      insulin glargine (LANTUS) injection 25 Units   DKA (diabetic ketoacidoses)     Other   Abscess of foot   Cellulitis and abscess of foot    Other Visit Diagnoses   Left foot infection    -  Primary    Relevant Medications       ceFAZolin (ANCEF) IVPB 1 g/50 mL premix (Completed)       vancomycin (VANCOCIN) IVPB 1000 mg/200 mL premix (Completed)       clindamycin (CLEOCIN) IVPB 600 mg       vancomycin (VANCOCIN) IVPB 1000 mg/200 mL premix    Right foot infection        Hyponatremia        Hypochloremia        Diabetes mellitus type 2, uncontrolled, with complications        Necrotizing fasciitis        Hypokalemia        Hypophosphatemia           - WBC improved but not completely resolved - plan for I&D foot with possible BKA Wed - will follow right foot - will likely allow auto amputation   Marianna Payment 09/09/2013, 12:35 PM [phone]

## 2013-09-09 NOTE — Progress Notes (Signed)
Physical Therapy Treatment Patient Details Name: [name] MRN: [id number] DOB: 1956-05-07 Today's Date: 09/09/2013 Time: 1700-1730 PT Time Calculation (min): 30 min  PT Assessment / Plan / Recommendation  History of Present Illness  Patient is a 58 y.o. male who was adm to the hospital and states that he had foot swelling since last week. Patient became incontinent of urine, feeling lightheaded, blurred vision, patient states that he started to loose balance as well. Denies any significant chest pain. Patient states that his feet are numb since last year. Patient has been wearing same socks and tenniss shoes for past 3 months at least. Patient was found to have ulcerations of left foot. 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 Ortho is following as he may still need L BKA (likely OR on 09/11/12).     PT Comments   Pt is progressing well with his mobility and was actually able to take some hop steps with the RW today.  Min assist overall.  He continues to be an excellent rehab candidate and is awaiting surgery 09/11/13.    Follow Up Recommendations  CIR     Does the patient have the potential to tolerate intense rehabilitation    NA  Barriers to Discharge   None      Equipment Recommendations  3in1 (PT);Rolling walker with 5" wheels;Wheelchair (measurements PT);Wheelchair cushion (measurements PT)    Recommendations for Other Services   None  Frequency Min 3X/week   Progress towards PT Goals Progress towards PT goals: Progressing toward goals  Plan Current plan remains appropriate    Precautions / Restrictions Precautions Precautions: Fall Precaution Comments: wound VAC Restrictions LLE Weight Bearing: Non weight bearing   Pertinent Vitals/Pain See vitals flow sheet.     Mobility  Bed Mobility Overal bed mobility: Modified Independent Bed Mobility: Supine to Sit General bed mobility comments: Mod I with HOB raised using the rail Transfers Overall  transfer level: Needs assistance Equipment used: Rolling walker (2 wheeled) Transfers: Sit to/from Stand Sit to Stand: Min assist General transfer comment: Min assist to support trunk during transitions.  Verbal cues for safe hand placement.  Ambulation/Gait Ambulation/Gait assistance: Min assist Ambulation Distance (Feet): 5 Feet Assistive device: Rolling walker (2 wheeled) Gait Pattern/deviations:  (hop-to pattern) General Gait Details: Pt demonstrated the ability to keep his left foot NWB while using the RW for hop to steps to the recliner chair.  Assist to steady him for balance and manage lines.     Exercises General Exercises - Upper Extremity Shoulder Flexion: AROM;Both;10 reps;Seated Elbow Flexion: AROM;Both;10 reps;Seated General Exercises - Lower Extremity Ankle Circles/Pumps: AROM;Both;10 reps;Seated Long Arc Quad: AROM;Both;10 reps;Seated Hip ABduction/ADduction: AROM;Both;10 reps;Seated (adduct against pillow for resistance) Hip Flexion/Marching: AROM;Both;10 reps;Seated Other Exercises Other Exercises: chair push ups with education on pressure relief.      PT Goals (current goals can now be found in the care plan section) Acute Rehab PT Goals Patient Stated Goal: to have surgery then get better  Visit Information  Last PT Received On: 09/09/13 Assistance Needed: +1 History of Present Illness:  Patient is a 58 y.o. male who was adm to the hospital and states that he had foot swelling since last week. Patient became incontinent of urine, feeling lightheaded, blurred vision, patient states that he started to loose balance as well. Denies any significant chest pain. Patient states that his feet are numb since last year. Patient has been wearing same socks and tenniss shoes for past  3 months at least. Patient was found to have ulcerations of left foot. 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 Ortho is following as he may still need L BKA (likely OR on  09/11/12).      Subjective Data  Subjective: Pt reports he has not been out of bed since I saw him on Friday.   Patient Stated Goal: to have surgery then get better   Cognition  Cognition Arousal/Alertness: Awake/alert Behavior During Therapy: WFL for tasks assessed/performed Overall Cognitive Status: Within Functional Limits for tasks assessed    Balance  Balance Sitting-balance support: Bilateral upper extremity supported;Feet supported Sitting balance-Leahy Scale: Good Standing balance support: Bilateral upper extremity supported Standing balance-Leahy Scale: Poor  End of Session PT - End of Session Equipment Utilized During Treatment: Gait belt Activity Tolerance: Patient tolerated treatment well Patient left: in chair;with call bell/phone within reach Nurse Communication: Mobility status        Wells Guiles B. Wilcox, [place], DPT [phone]   09/09/2013, 6:55 PM

## 2013-09-09 NOTE — Progress Notes (Signed)
Occupational Therapy Treatment Patient Details Name: [name] MRN: [id number] DOB: [birth year] Today's Date: 09/09/2013 Time: [id number] OT Time Calculation (min): 26 min  OT Assessment / Plan / Recommendation  History of present illness  Patient is a 58 y.o. male who was adm to the hospital and states that he had foot swelling since last week. Patient became incontinent of urine, feeling lightheaded, blurred vision, patient states that he started to loose balance as well. Denies any significant chest pain. Patient states that his feet are numb since last year. Patient has been wearing same socks and tenniss shoes for past 3 months at least. Patient was found to have ulcerations of left foot. 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 Ortho is following as he may still need L BKA.     OT comments  Pt did well with UE exercises with theraband and encouraged him to perform also on his own to improve strength for functional transfers and ADL.   Follow Up Recommendations  CIR;Supervision/Assistance - 24 hour    Barriers to Discharge       Equipment Recommendations  None recommended by OT (next venue)    Recommendations for Other Services    Frequency Min 2X/week   Progress towards OT Goals Progress towards OT goals: Progressing toward goals  Plan Discharge plan remains appropriate    Precautions / Restrictions Precautions Precautions: Fall Precaution Comments: wound VAC Restrictions Weight Bearing Restrictions: Yes LLE Weight Bearing: Non weight bearing   Pertinent Vitals/Pain No complaint of    ADL       OT Diagnosis:    OT Problem List:   OT Treatment Interventions:     OT Goals(current goals can now be found in the care plan section)    Visit Information  Last OT Received On: 09/09/13 History of Present Illness:  Patient is a 58 y.o. male who was adm to the hospital and states that he had foot swelling since last week. Patient became incontinent of  urine, feeling lightheaded, blurred vision, patient states that he started to loose balance as well. Denies any significant chest pain. Patient states that his feet are numb since last year. Patient has been wearing same socks and tenniss shoes for past 3 months at least. Patient was found to have ulcerations of left foot. 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 Ortho is following as he may still need L BKA.      Subjective Data      Prior Functioning       Cognition       Mobility       Exercises  Other Exercises Other Exercises: blue theraband exercises performed. Issued handout and reviewed with patient. Pt performed horizontal abduction X 10, elbow flexion/extension each X 10, shoulder flexion/extension X 10 each. Encouraged pt to perform 10 reps , 3 times per day. He needed moderate tactile, demo and verbal cues to perform correctly.    Balance    End of Session OT - End of Session Activity Tolerance: Patient tolerated treatment well Patient left: in bed  [place], [place]  [id number] 09/09/2013, 1:01 PM

## 2013-09-10 LAB — BASIC METABOLIC PANEL
BUN: 13 mg/dL (ref 6–23)
CHLORIDE: 103 meq/L (ref 96–112)
CO2: 24 meq/L (ref 19–32)
Calcium: 8.6 mg/dL (ref 8.4–10.5)
Creatinine, Ser: 0.97 mg/dL (ref 0.50–1.35)
GFR calc non Af Amer: >90 mL/min (ref >90)
Glucose, Bld: 166 mg/dL — ABNORMAL HIGH (ref 70–99)
Potassium: 3.9 mEq/L (ref 3.7–5.3)
Sodium: 144 mEq/L (ref 137–147)

## 2013-09-10 LAB — GLUCOSE, CAPILLARY
Glucose-Capillary: 159 mg/dL — ABNORMAL HIGH (ref 70–99)
Glucose-Capillary: 247 mg/dL — ABNORMAL HIGH (ref 70–99)
Glucose-Capillary: 244 mg/dL — ABNORMAL HIGH (ref 70–99)
Glucose-Capillary: 289 mg/dL — ABNORMAL HIGH (ref 70–99)

## 2013-09-10 LAB — CULTURE, BLOOD (ROUTINE X 2)
Culture: NO GROWTH
Culture: NO GROWTH

## 2013-09-10 LAB — CBC
HEMATOCRIT: 28.1 % — AB (ref 39.0–52.0)
Hemoglobin: 9.7 g/dL — ABNORMAL LOW (ref 13.0–17.0)
MCH: 29.8 pg (ref 26.0–34.0)
MCHC: 34.5 g/dL (ref 30.0–36.0)
MCV: 86.5 fL (ref 78.0–100.0)
Platelets: 234 10*3/uL (ref 150–400)
RBC: 3.25 MIL/uL — AB (ref 4.22–5.81)
RDW: 13.3 % (ref 11.5–15.5)
WBC: 12.7 10*3/uL — AB (ref 4.0–10.5)

## 2013-09-10 MED ORDER — INSULIN PEN STARTER KIT
1.0000 | Freq: Once | Status: AC
  Administered 2013-09-10: 1
  Filled 2013-09-10: qty 1

## 2013-09-10 MED ORDER — INSULIN GLARGINE 100 UNIT/ML ~~LOC~~ SOLN
15.0000 [IU] | Freq: Every day | SUBCUTANEOUS | Status: DC
  Administered 2013-09-10 – 2013-09-11 (×2): 15 [IU] via SUBCUTANEOUS
  Filled 2013-09-10 (×3): qty 0.15

## 2013-09-10 NOTE — Progress Notes (Signed)
NUTRITION FOLLOW UP  Intervention:    Continue CHO modified diet.  Continue Glucerna Shake PO BID, each supplement provides 220 kcal and 10 grams of protein.  Diet education provided 2/4  Nutrition Dx:   Increased nutrient needs related to infected foot wound as evidenced by estimated nutrition needs. Ongoing.  Goal:   Intake to meet >90% of estimated nutrition needs to support wound healing. Progressing.  Monitor:   PO intake, labs, weight trend, wound healing.  Assessment:   Patient with history of hepatitis; presented to the hospital on 2/1 with foot swelling since last week. Patient became incontinent of urine, feeling lightheaded, blurred vision, patient states that he started to loose balance as well. Patient states that his feet are numb since last year. Patient has been wearing same socks and tenniss shoes for past 3 months at least. X-ray on admission showed gas in the soft tissue.   S/P debridement of bone, muscle, and soft tissue with fifth ray amputation and amputation of fourth toe through the metatarsal phalangeal joint on 2/3. Wound VAC was also placed.  Plan for I&D of his foot with possible BKA on Wednesday. Per pt his appetite is good eats most of every meal and likes the shakes as well. Pt states that he is going to focus learning about how much insulin to give himself based on his blood sugars before he worries about his diet. Encouragement provided.   Height: Ht Readings from Last 1 Encounters:  09/02/13 5\' 3"  (1.6 m)    Weight Status:   Wt Readings from Last 1 Encounters:  09/02/13 148 lb (67.132 kg)  Admission weight 144 lb (65.3 kg)  Re-estimated needs:  Kcal: 1950-2150 Protein: 95-110 gm Fluid: 2L  Skin: Left foot infection and abscess concerning for necrotizing fasciitis     Diet Order: Carb Control Meal Completion: 75-100%   Intake/Output Summary (Last 24 hours) at 09/10/13 1124 Last data filed at 09/10/13 0535  Gross per 24 hour  Intake     240 ml  Output   1032 ml  Net   -792 ml    Last BM: 2/10   Labs:   Recent Labs Lab 09/05/13 0604  09/06/13 1417  09/08/13 0425 09/09/13 0700 09/10/13 0445  NA 144  < > 141  < > 141 142 144  K 3.5*  < > 3.7  < > 3.9 3.8 3.9  CL 109  < > 103  < > 102 103 103  CO2 19  < > 23  < > 26 26 24   BUN 8  < > 11  < > 13 13 13   CREATININE 0.97  < > 0.92  < > 1.00 0.98 0.97  CALCIUM 7.2*  < > 7.3*  < > 7.9* 8.1* 8.6  PHOS 1.7*  --  3.0  --   --   --   --   GLUCOSE 101*  < > 251*  < > 243* 217* 166*  < > = values in this interval not displayed.  CBG (last 3)   Recent Labs  09/09/13 1618 09/09/13 2112 09/10/13 0635  GLUCAP 156* 166* 159*    Scheduled Meds: . clindamycin (CLEOCIN) IV  600 mg Intravenous Q8H  . feeding supplement (GLUCERNA SHAKE)  237 mL Oral BID BM  . insulin aspart  0-20 Units Subcutaneous TID WC  . insulin aspart  0-5 Units Subcutaneous QHS  . insulin glargine  15 Units Subcutaneous QHS  . piperacillin-tazobactam (ZOSYN)  IV  3.375  g Intravenous Q8H  . sodium chloride  3 mL Intravenous Q12H  . vancomycin  1,000 mg Intravenous Q12H    Continuous Infusions:     Maylon Peppers RD, LDN, CNSC [phone] Pager [phone] After Hours Pager

## 2013-09-10 NOTE — Consult Note (Signed)
Physical Medicine and Rehabilitation Consult  Reason for Consult: Necrotizing fascitis left foot Referring Physician: Dr. Tyrell Antonio.    HPI: [name] is a 58 y.o. male with history of diabetes mellitus, HTN, hepatitis, BLE numbness since frostbite last year but no medical care. He has been living in his car for past three years (had declined opportunity to live with family) who was brought to ED on 09/01/13 by family due to inability to care for self, confusion as well as left foot infection. X rays with extensive soft tissue gas consistent with cellulitis and abscess formation.Marland Kitchen He was found to have diabetic ketoacidosis and treated with insulin infusion.  He was evaluated by Dr. Erlinda Hong and placed on IV antibiotics. MRI with  for concern for necrotizing fascitis and patient taken to OR for I & D with 4 th and 5th toe amputation with placement of VAC. Also noted to have necrotic 1st, 2nd and 5 th toes on the right and likey allow auto amputation per ortho. 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  Electrolyte abnormalities being monitored with concerns for refeeding syndrome. Mentation has improved and leucocytosis resolving.  Hemoglobin A1c 12.2 with insulin therapy as directed. Await followup evaluations per physical and occupational therapy. M.D. as requested physical medicine rehabilitation consult to consider inpatient rehabilitation services.  Pt visiting with sister and niece in room, sister states he can live at her house but cannot provide 24/7 sup.  Can do meals.  Review of Systems  Cardiovascular: Positive for leg swelling.  Neurological: Positive for tingling.  All other systems reviewed and are negative.    Past Medical History  Diagnosis Date  . Hepatitis   . Hypertension   . Pneumonia     HX OF PNA  . Diabetes mellitus without complication     PATIENT JUST LEARNED HE WAS DIABETIC  . Necrotizing fasciitis   . Hyponatremia   .  Hypokalemia    Past Surgical History  Procedure Laterality Date  . Cholecystectomy    . I&d extremity Left 09/03/2013    Procedure: IRRIGATION AND DEBRIDEMENT EXTREMITY;  Surgeon: Marianna Payment, MD;  Location: [facility];  Service: Orthopedics;  Laterality: Left;   Family History  Problem Relation Age of Onset  . Diabetes type II Mother    Social History:  reports that he has never smoked. He has never used smokeless tobacco. He reports that he does not drink alcohol or use illicit drugs.   Allergies: No Known Allergies   No prescriptions prior to admission    Home: Home Living Family/patient expects to be discharged to:: Private residence Living Arrangements: Other (Comment) (Sister) Available Help at Discharge: Family;Available 24 hours/day Type of Home: House Home Access: Stairs to enter [employer] of Steps: 2-3 Entrance Stairs-Rails: None Home Layout: One level Home Equipment: None  Functional History:   Functional Status:  Mobility:     Ambulation/Gait Ambulation Distance (Feet): 5 Feet General Gait Details: Pt demonstrated the ability to keep his left foot NWB while using the RW for hop to steps to the recliner chair.  Assist to steady him for balance and manage lines.  Wheelchair Mobility Distance: 60  ADL: ADL Eating/Feeding: Supervision/safety Where Assessed - Eating/Feeding: Chair Grooming: Minimal assistance;Simulated;Set up;Supervision/safety;Min guard Where Assessed - Grooming: Supported sitting Upper Body Bathing: Minimal assistance Where Assessed - Upper Body Bathing: Supported sitting Lower Body Bathing: Moderate assistance Where Assessed - Lower Body Bathing: Supported sit to stand Upper  Body Dressing: Minimal assistance Where Assessed - Upper Body Dressing: Supported sitting Lower Body Dressing: Maximal assistance Where Assessed - Lower Body Dressing: Supported sit to stand Toilet Transfer: +2 Total assistance;Moderate  assistance Toilet Transfer Method: Sit to stand Transfers/Ambulation Related to ADLs: +2 ADL Comments: decline in function  Cognition: Cognition Overall Cognitive Status: Within Functional Limits for tasks assessed Orientation Level: Oriented X4 Cognition Arousal/Alertness: Awake/alert Behavior During Therapy: WFL for tasks assessed/performed Overall Cognitive Status: Within Functional Limits for tasks assessed Awareness: Emergent Problem Solving: Slow processing General Comments: No impulsivity today, good awareness of his body position and maintaining L foot NWB throughout mobility.   Blood pressure 126/68, pulse 82, temperature 98.2 F (36.8 C), temperature source Oral, resp. rate 16, height 5\' 3"  (1.6 m), weight 67.132 kg (148 lb), SpO2 97.00%. Physical Exam  Vitals reviewed. Constitutional: He is oriented to person, place, and time.  HENT:  Head: Normocephalic.  Eyes: EOM are normal.  Neck: Normal range of motion. Neck supple. No thyromegaly present.  Cardiovascular: Normal rate and regular rhythm.   Respiratory: Effort normal and breath sounds normal. No respiratory distress.  GI: Soft. Bowel sounds are normal. He exhibits no distension.  Musculoskeletal:  Left coban dressing limiting ROM at knee  Shoulder , elbow, wrist  And hand ROM normal  Neurological: He is alert and oriented to person, place, and time. A sensory deficit is present.  Follows full commands  5/5 strength Bilateral delt, bi, tri, grip,  4/5 Bilateral HF and R KE  3- R ankle DF/PF 2-/5 Toe flexors and ext  Absent sensation LT toes of R foot  Skin:  Right BK Amputation site dressed and appropriately tender. Necrotic changes Right 1st and 2nd toes    Results for orders placed during the hospital encounter of 09/01/13 (from the past 24 hour(s))  GLUCOSE, CAPILLARY     Status: Abnormal   Collection Time    09/10/13  5:52 PM      Result Value Ref Range   Glucose-Capillary 244 (*) 70 - 99 mg/dL   GLUCOSE, CAPILLARY     Status: Abnormal   Collection Time    09/10/13  9:24 PM      Result Value Ref Range   Glucose-Capillary 289 (*) 70 - 99 mg/dL  CBC     Status: Abnormal   Collection Time    09/11/13  3:48 AM      Result Value Ref Range   WBC 10.5  4.0 - 10.5 K/uL   RBC 3.17 (*) 4.22 - 5.81 MIL/uL   Hemoglobin 9.5 (*) 13.0 - 17.0 g/dL   HCT 27.9 (*) 39.0 - 52.0 %   MCV 88.0  78.0 - 100.0 fL   MCH 30.0  26.0 - 34.0 pg   MCHC 34.1  30.0 - 36.0 g/dL   RDW 13.3  11.5 - 15.5 %   Platelets 249  150 - 400 K/uL  BASIC METABOLIC PANEL     Status: Abnormal   Collection Time    09/11/13  3:48 AM      Result Value Ref Range   Sodium 140  137 - 147 mEq/L   Potassium 4.2  3.7 - 5.3 mEq/L   Chloride 99  96 - 112 mEq/L   CO2 27  19 - 32 mEq/L   Glucose, Bld 269 (*) 70 - 99 mg/dL   BUN 12  6 - 23 mg/dL   Creatinine, Ser 1.06  0.50 - 1.35 mg/dL   Calcium 8.6  8.4 - 10.5 mg/dL   GFR calc non Af Amer 77 (*) >90 mL/min   GFR calc Af Amer 89 (*) >90 mL/min  GLUCOSE, CAPILLARY     Status: Abnormal   Collection Time    09/11/13  7:04 AM      Result Value Ref Range   Glucose-Capillary 238 (*) 70 - 99 mg/dL  SURGICAL PCR SCREEN     Status: None   Collection Time    09/11/13  9:38 AM      Result Value Ref Range   MRSA, PCR NEGATIVE  NEGATIVE   Staphylococcus aureus NEGATIVE  NEGATIVE  GLUCOSE, CAPILLARY     Status: Abnormal   Collection Time    09/11/13 10:23 AM      Result Value Ref Range   Glucose-Capillary 181 (*) 70 - 99 mg/dL  GLUCOSE, CAPILLARY     Status: Abnormal   Collection Time    09/11/13 12:54 PM      Result Value Ref Range   Glucose-Capillary 195 (*) 70 - 99 mg/dL   No results found.  Assessment/Plan: Diagnosis:Left BKA, and R necrotic toes 1. Does the need for close, 24 hr/day medical supervision in concert with the patient's rehab needs make it unreasonable for this patient to be served in a less intensive setting? Yes 2. Co-Morbidities requiring  supervision/potential complications: Diabetes uncontrolled with neuropathy 3. Due to bladder management, bowel management, safety, skin/wound care, disease management, medication administration, pain management and patient education, does the patient require 24 hr/day rehab nursing? Yes 4. Does the patient require coordinated care of a physician, rehab nurse, PT (1-2 hrs/day, 5 days/week) and OT (1-2 hrs/day, 5 days/week) to address physical and functional deficits in the context of the above medical diagnosis(es)? Yes Addressing deficits in the following areas: balance, endurance, locomotion, strength, transferring, bowel/bladder control, bathing, dressing, feeding, grooming and toileting 5. Can the patient actively participate in an intensive therapy program of at least 3 hrs of therapy per day at least 5 days per week? Yes 6. The potential for patient to make measurable gains while on inpatient rehab is good 7. Anticipated functional outcomes upon discharge from inpatient rehab are Mod I short [place] distance with walker with PT, Mod I/Sup for ADL with OT, NA with SLP. 8. Estimated rehab length of stay to reach the above functional goals is: 7 days 9. Does the patient have adequate social supports to accommodate these discharge functional goals? Yes 10. Anticipated D/C setting: Home 11. Anticipated post D/C treatments: [place] therapy 12. Overall Rehab/Functional Prognosis: good  RECOMMENDATIONS: This patient's condition is appropriate for continued rehabilitative care in the following setting: CIR Patient has agreed to participate in recommended program. Yes Note that insurance prior authorization may be required for reimbursement for recommended care.  Comment: Sister can provide bathing assist as well     09/11/2013

## 2013-09-10 NOTE — Progress Notes (Signed)
CSW (Clinical Education officer, museum) continues to follow pt for SNF backup for CIR. CSW has contacted facilities that do accept LOGs. Pt does not currently have any bed offers but does have facilities that are considering. Facilities are requesting updated notes after pt surgery and will need dc med list to make final decision.   [place], [place]

## 2013-09-10 NOTE — Progress Notes (Signed)
[facility] PROGRESS NOTE  [name] [id number] DOB: 1955/08/04 DOA: 09/01/2013 PCP: No PCP Per Patient  Brief narrative: [name] is a 58yo man with likely necrotizing infection of the left foot s/p Debridement on 2/3 by orthopedics. He is on Antibiotics and plan appears to be BKA early next week. He also has necrotic 1st, 2nd and 5th toes on the right.  Assessment/plan Suspected necrotizing infection of the left foot  - Currently on vancomycin, Zosyn and clindamycin - day 8 - MRI left foot confirmed necrotizing infection  - Orthopedics is following along - On 2/3 Underwent debridement, fifth ray amputation, fourth toe through metatarsal amputation and wound vac application  - Wound vac in place, draining reddish fluid - plan for BKA , ID on Wednesday.  - WBC trending down.  - At rest ABI was normal  - Pain control with oral medications.  - BC X 2 from 2/3 are NG.  Black appearing toe-right foot, 1st, 2nd and 5th  - At rest ABI WNL  - Monitor clinically for changes -Per ortho.   Diabetic ketoacidosis  - Bicarb normalized.  - He has been transitioned to lantus and SSI.  - A1c 12.2 at admission, this is a new diagnosis  - will require Insulin on discharge.  -Will decrease lantus to 15 units from 28 anticipating NPO status after midnight today for surgery tomorrow.   Hypokalemia  - K remains normal today, resolved  Hypophosphatemia  - Normal at last check, resolved  PseudoHypocalcemia due to hypoalbuminemia - Corrected Ca 9.2  DVT Prophylaxis:  SCD's   Consultants:  Orthopedics  PT/OT  DM Coordinator   Procedures: 2/3 by dr.Xu  1. Debridement of bone, muscle, skin, soft tissue.  2. Fifth ray amputation.  3. 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  4. Application of negative pressure wound therapy greater   Antibiotics:  Clindamycin - -> 2/2  Zosyn - -> 2/2  Vancomycin - -> 2/2  Code Status: Full  Family Communication:  Pt at bedside  Disposition Plan: Will likely need SNF or CIR, pending surgery.    HPI/Subjective: No events overnight. No complaints.   Objective: Filed Vitals:   09/09/13 1304 09/09/13 2103 09/10/13 0622 09/10/13 1303  BP: 142/67 138/93 134/85 122/68  Pulse: 94 95 92 91  Temp: 98.9 F (37.2 C) 97.9 F (36.6 C) 98.9 F (37.2 C) 99.7 F (37.6 C)  TempSrc:    Oral  Resp: $Remo'16 18 18 16  'bIzuo$ Height:      Weight:      SpO2: 94% 96% 99% 92%    Intake/Output Summary (Last 24 hours) at 09/10/13 1528 Last data filed at 09/10/13 1230  Gross per 24 hour  Intake    560 ml  Output   1532 ml  Net   -972 ml    Exam:   General:  Pt is alert upon awakening, no distress  Cardiovascular: Regular rate and rhythm, S1/S2  Respiratory: Clear to auscultation bilaterally  Abdomen: Soft, non tender, non distended, bowel sounds present  Extremities: No edema, wound vac in place to LL foot draining red fluid, black 1st, 2nd and fifth toe on the right, pulses are palpable, foot is warm.  Neuro: Grossly nonfocal  Data Reviewed: Basic Metabolic Panel:  Recent Labs Lab 09/05/13 0604  09/06/13 1417 09/07/13 0403 09/08/13 0425 09/09/13 0700 09/10/13 0445  NA 144  < > 141 143 141 142 144  K 3.5*  < > 3.7 3.9 3.9 3.8  3.9  CL 109  < > 103 104 102 103 103  CO2 19  < > $R'23 25 26 26 24  'Eu$ GLUCOSE 101*  < > 251* 230* 243* 217* 166*  BUN 8  < > $R'11 10 13 13 13  'LS$ CREATININE 0.97  < > 0.92 1.06 1.00 0.98 0.97  CALCIUM 7.2*  < > 7.3* 7.5* 7.9* 8.1* 8.6  PHOS 1.7*  --  3.0  --   --   --   --   < > = values in this interval not displayed. Liver Function Tests:  Recent Labs Lab 09/06/13 1417  AST 16  ALT 14  ALKPHOS 114  BILITOT 0.2*  PROT 6.3  ALBUMIN 1.6*   CBC:  Recent Labs Lab 09/05/13 0604 09/06/13 0609 09/08/13 0425 09/09/13 0700 09/10/13 0445  WBC 17.8* 14.4* 11.9* 12.0* 12.7*  HGB 9.8* 9.8* 9.3* 9.6* 9.7*  HCT 26.6* 26.9* 26.9* 27.2* 28.1*  MCV 82.4 83.0 85.9 86.3 86.5  PLT  161 149* 175 220 234   CBG:  Recent Labs Lab 09/09/13 1141 09/09/13 1618 09/09/13 2112 09/10/13 0635 09/10/13 1150  GLUCAP 279* 156* 166* 159* 247*    Recent Results (from the past 240 hour(s))  WOUND CULTURE     Status: None   Collection Time    09/01/13  8:18 PM      Result Value Range Status   Specimen Description WOUND LEFT FOOT   Final   Special Requests NONE   Final   Gram Stain     Final   Value: NO WBC SEEN     NO SQUAMOUS EPITHELIAL CELLS SEEN     MODERATE GRAM POSITIVE COCCI IN PAIRS     MODERATE GRAM NEGATIVE RODS     Performed at [facility]   Culture     Final   Value: FEW PROTEUS MIRABILIS     MODERATE STAPHYLOCOCCUS SPECIES (COAGULASE NEGATIVE)     Performed at [facility]   Report Status 09/07/2013 FINAL   Final   Organism ID, Bacteria PROTEUS MIRABILIS   Final  ANAEROBIC CULTURE     Status: None   Collection Time    09/03/13 12:03 PM      Result Value Range Status   Specimen Description ABSCESS FOOT LEFT   Final   Special Requests PT ON VANCO ZOSYN   Final   Gram Stain     Final   Value: NO WBC SEEN     NO SQUAMOUS EPITHELIAL CELLS SEEN     MODERATE GRAM POSITIVE COCCI IN PAIRS     Performed at [facility]   Culture     Final   Value: NO ANAEROBES ISOLATED     Performed at [facility]   Report Status 09/08/2013 FINAL   Final  CULTURE, ROUTINE-ABSCESS     Status: None   Collection Time    09/03/13 12:03 PM      Result Value Range Status   Specimen Description ABSCESS FOOT LEFT   Final   Special Requests PT ON VANCO ZOSYN   Final   Gram Stain     Final   Value: NO WBC SEEN     NO SQUAMOUS EPITHELIAL CELLS SEEN     MODERATE GRAM POSITIVE COCCI IN PAIRS     Performed at [facility]   Culture     Final   Value: MODERATE PROTEUS MIRABILIS     Performed at [facility]  Report Status 09/07/2013 FINAL   Final   Organism ID, Bacteria PROTEUS MIRABILIS   Final  CULTURE, BLOOD (ROUTINE X 2)      Status: None   Collection Time    09/03/13 10:00 PM      Result Value Range Status   Specimen Description BLOOD RIGHT HAND   Final   Special Requests BOTTLES DRAWN AEROBIC ONLY 3CC   Final   Culture  Setup Time     Final   Value: 09/04/2013 03:30     Performed at [facility]   Culture     Final   Value: NO GROWTH 5 DAYS     Performed at [facility]   Report Status 09/10/2013 FINAL   Final  CULTURE, BLOOD (ROUTINE X 2)     Status: None   Collection Time    09/03/13 10:05 PM      Result Value Range Status   Specimen Description BLOOD LEFT HAND   Final   Special Requests BOTTLES DRAWN AEROBIC ONLY 4CC   Final   Culture  Setup Time     Final   Value: 09/04/2013 03:30     Performed at [facility]   Culture     Final   Value: NO GROWTH 5 DAYS     Performed at [facility]   Report Status 09/10/2013 FINAL   Final     Scheduled Meds: . clindamycin (CLEOCIN) IV  600 mg Intravenous Q8H  . feeding supplement (GLUCERNA SHAKE)  237 mL Oral BID BM  . insulin aspart  0-20 Units Subcutaneous TID WC  . insulin aspart  0-5 Units Subcutaneous QHS  . insulin glargine  15 Units Subcutaneous QHS  . Insulin Pen Starter Kit  1 kit Other Once  . piperacillin-tazobactam (ZOSYN)  IV  3.375 g Intravenous Q8H  . sodium chloride  3 mL Intravenous Q12H  . vancomycin  1,000 mg Intravenous Q12H   Continuous Infusions:    Veneda Kirksey, MD  [facility] Pager [phone]  If 7PM-7AM, please contact night-coverage [email or web address] Password [email or web address] 09/10/2013, 3:28 PM   LOS: 9 days

## 2013-09-11 ENCOUNTER — Encounter (HOSPITAL_COMMUNITY): Payer: Medicaid Other | Admitting: Certified Registered Nurse Anesthetist

## 2013-09-11 ENCOUNTER — Inpatient Hospital Stay (HOSPITAL_COMMUNITY): Payer: Medicaid Other | Admitting: Certified Registered Nurse Anesthetist

## 2013-09-11 ENCOUNTER — Encounter (HOSPITAL_COMMUNITY): Payer: Self-pay | Admitting: Certified Registered Nurse Anesthetist

## 2013-09-11 ENCOUNTER — Encounter (HOSPITAL_COMMUNITY)
Admission: EM | Disposition: A | Discharge status: Rehabilitation Facility | Payer: Self-pay | Source: Home / Self Care | Attending: Internal Medicine

## 2013-09-11 HISTORY — PX: AMPUTATION: SHX166

## 2013-09-11 HISTORY — PX: I & D EXTREMITY: SHX5045

## 2013-09-11 LAB — GLUCOSE, CAPILLARY
GLUCOSE-CAPILLARY: 238 mg/dL — AB (ref 70–99)
GLUCOSE-CAPILLARY: 195 mg/dL — AB (ref 70–99)
Glucose-Capillary: 181 mg/dL — ABNORMAL HIGH (ref 70–99)
Glucose-Capillary: 276 mg/dL — ABNORMAL HIGH (ref 70–99)
Glucose-Capillary: 196 mg/dL — ABNORMAL HIGH (ref 70–99)

## 2013-09-11 LAB — CBC
HEMATOCRIT: 27.9 % — AB (ref 39.0–52.0)
HEMOGLOBIN: 9.5 g/dL — AB (ref 13.0–17.0)
MCH: 30 pg (ref 26.0–34.0)
MCHC: 34.1 g/dL (ref 30.0–36.0)
MCV: 88 fL (ref 78.0–100.0)
Platelets: 249 10*3/uL (ref 150–400)
RBC: 3.17 MIL/uL — ABNORMAL LOW (ref 4.22–5.81)
RDW: 13.3 % (ref 11.5–15.5)
WBC: 10.5 10*3/uL (ref 4.0–10.5)

## 2013-09-11 LAB — BASIC METABOLIC PANEL
BUN: 12 mg/dL (ref 6–23)
CALCIUM: 8.6 mg/dL (ref 8.4–10.5)
CO2: 27 meq/L (ref 19–32)
Chloride: 99 mEq/L (ref 96–112)
Creatinine, Ser: 1.06 mg/dL (ref 0.50–1.35)
GFR calc Af Amer: 89 mL/min — ABNORMAL LOW (ref >90)
GFR calc non Af Amer: 77 mL/min — ABNORMAL LOW (ref >90)
GLUCOSE: 269 mg/dL — AB (ref 70–99)
Potassium: 4.2 mEq/L (ref 3.7–5.3)
Sodium: 140 mEq/L (ref 137–147)

## 2013-09-11 LAB — SURGICAL PCR SCREEN
MRSA, PCR: NEGATIVE
STAPHYLOCOCCUS AUREUS: NEGATIVE

## 2013-09-11 SURGERY — IRRIGATION AND DEBRIDEMENT EXTREMITY
Anesthesia: General | Site: Leg Lower | Laterality: Left

## 2013-09-11 MED ORDER — FENTANYL CITRATE 0.05 MG/ML IJ SOLN
INTRAMUSCULAR | Status: AC
  Filled 2013-09-11: qty 5

## 2013-09-11 MED ORDER — METHOCARBAMOL 500 MG PO TABS: 500.0000 mg | ORAL_TABLET | Freq: Four times a day (QID) | ORAL | Status: DC | PRN

## 2013-09-11 MED ORDER — PHENYLEPHRINE 40 MCG/ML (10ML) SYRINGE FOR IV PUSH (FOR BLOOD PRESSURE SUPPORT)
PREFILLED_SYRINGE | INTRAVENOUS | Status: AC
  Filled 2013-09-11: qty 10

## 2013-09-11 MED ORDER — MIDAZOLAM HCL 5 MG/5ML IJ SOLN
INTRAMUSCULAR | Status: DC | PRN
  Administered 2013-09-11: 2 mg via INTRAVENOUS

## 2013-09-11 MED ORDER — DIPHENHYDRAMINE HCL 12.5 MG/5ML PO ELIX: 25.0000 mg | ORAL_SOLUTION | ORAL | Status: DC | PRN

## 2013-09-11 MED ORDER — PHENYLEPHRINE HCL 10 MG/ML IJ SOLN
INTRAMUSCULAR | Status: DC | PRN
  Administered 2013-09-11: 40 ug via INTRAVENOUS
  Administered 2013-09-11 (×7): 80 ug via INTRAVENOUS
  Administered 2013-09-11: 120 ug via INTRAVENOUS

## 2013-09-11 MED ORDER — PROMETHAZINE HCL 25 MG/ML IJ SOLN
6.2500 mg | INTRAMUSCULAR | Status: DC | PRN
  Administered 2013-09-11: 6.25 mg via INTRAVENOUS

## 2013-09-11 MED ORDER — HYDROCODONE-ACETAMINOPHEN 5-325 MG PO TABS: 1.0000 | ORAL_TABLET | ORAL | Status: DC | PRN

## 2013-09-11 MED ORDER — LIDOCAINE HCL (CARDIAC) 10 MG/ML IV SOLN
INTRAVENOUS | Status: DC | PRN
  Administered 2013-09-11: 80 mg via INTRAVENOUS

## 2013-09-11 MED ORDER — METHOCARBAMOL 100 MG/ML IJ SOLN
500.0000 mg | Freq: Four times a day (QID) | INTRAVENOUS | Status: DC | PRN
  Filled 2013-09-11: qty 5

## 2013-09-11 MED ORDER — MIDAZOLAM HCL 2 MG/2ML IJ SOLN
INTRAMUSCULAR | Status: AC
  Filled 2013-09-11: qty 2

## 2013-09-11 MED ORDER — ARTIFICIAL TEARS OP OINT
TOPICAL_OINTMENT | OPHTHALMIC | Status: AC
  Filled 2013-09-11: qty 3.5

## 2013-09-11 MED ORDER — ONDANSETRON HCL 4 MG/2ML IJ SOLN
INTRAMUSCULAR | Status: DC | PRN
  Administered 2013-09-11: 4 mg via INTRAVENOUS

## 2013-09-11 MED ORDER — ONDANSETRON HCL 4 MG/2ML IJ SOLN
INTRAMUSCULAR | Status: AC
  Filled 2013-09-11: qty 2

## 2013-09-11 MED ORDER — LACTATED RINGERS IV SOLN
INTRAVENOUS | Status: DC | PRN
  Administered 2013-09-11: 11:00:00 via INTRAVENOUS

## 2013-09-11 MED ORDER — MORPHINE SULFATE 2 MG/ML IJ SOLN: 1.0000 mg | INTRAMUSCULAR | Status: DC | PRN

## 2013-09-11 MED ORDER — 0.9 % SODIUM CHLORIDE (POUR BTL) OPTIME
TOPICAL | Status: DC | PRN
  Administered 2013-09-11: 1000 mL

## 2013-09-11 MED ORDER — MEPERIDINE HCL 25 MG/ML IJ SOLN: 6.2500 mg | INTRAMUSCULAR | Status: DC | PRN

## 2013-09-11 MED ORDER — OXYCODONE HCL 5 MG PO TABS: 5.0000 mg | ORAL_TABLET | Freq: Once | ORAL | Status: DC | PRN

## 2013-09-11 MED ORDER — OXYCODONE HCL 5 MG PO TABS: 5.0000 mg | ORAL_TABLET | ORAL | Status: DC | PRN

## 2013-09-11 MED ORDER — HYDROMORPHONE HCL PF 1 MG/ML IJ SOLN
INTRAMUSCULAR | Status: AC
  Filled 2013-09-11: qty 1

## 2013-09-11 MED ORDER — OXYCODONE HCL 5 MG/5ML PO SOLN: 5.0000 mg | Freq: Once | ORAL | Status: DC | PRN

## 2013-09-11 MED ORDER — MIDAZOLAM HCL 2 MG/2ML IJ SOLN: 0.5000 mg | Freq: Once | INTRAMUSCULAR | Status: DC | PRN

## 2013-09-11 MED ORDER — LIDOCAINE HCL (CARDIAC) 20 MG/ML IV SOLN
INTRAVENOUS | Status: AC
  Filled 2013-09-11: qty 10

## 2013-09-11 MED ORDER — PROMETHAZINE HCL 25 MG/ML IJ SOLN
INTRAMUSCULAR | Status: AC
  Administered 2013-09-11: 6.25 mg via INTRAVENOUS
  Filled 2013-09-11: qty 1

## 2013-09-11 MED ORDER — HYDROMORPHONE HCL PF 1 MG/ML IJ SOLN
0.2500 mg | INTRAMUSCULAR | Status: DC | PRN
  Administered 2013-09-11 (×2): 0.5 mg via INTRAVENOUS

## 2013-09-11 MED ORDER — PROPOFOL 10 MG/ML IV BOLUS
INTRAVENOUS | Status: AC
  Filled 2013-09-11: qty 20

## 2013-09-11 MED ORDER — FENTANYL CITRATE 0.05 MG/ML IJ SOLN
INTRAMUSCULAR | Status: DC | PRN
  Administered 2013-09-11: 100 ug via INTRAVENOUS
  Administered 2013-09-11 (×3): 50 ug via INTRAVENOUS

## 2013-09-11 MED ORDER — SODIUM CHLORIDE 0.9 % IR SOLN
Status: DC | PRN
  Administered 2013-09-11: 1000 mL
  Administered 2013-09-11: 3000 mL

## 2013-09-11 MED ORDER — PROPOFOL 10 MG/ML IV BOLUS
INTRAVENOUS | Status: DC | PRN
  Administered 2013-09-11: 120 mg via INTRAVENOUS

## 2013-09-11 SURGICAL SUPPLY — 76 items
BANDAGE CONFORM 3  STR LF (GAUZE/BANDAGES/DRESSINGS) IMPLANT
BANDAGE ELASTIC 3 VELCRO ST LF (GAUZE/BANDAGES/DRESSINGS) IMPLANT
BANDAGE ESMARK 6X9 LF (GAUZE/BANDAGES/DRESSINGS) ×2 IMPLANT
BANDAGE GAUZE ELAST BULKY 4 IN (GAUZE/BANDAGES/DRESSINGS) IMPLANT
BLADE SAW RECIP 87.9 MT (BLADE) ×4 IMPLANT
BLADE SURG 10 STRL SS (BLADE) ×4 IMPLANT
BNDG COHESIVE 1X5 TAN STRL LF (GAUZE/BANDAGES/DRESSINGS) IMPLANT
BNDG COHESIVE 4X5 TAN STRL (GAUZE/BANDAGES/DRESSINGS) ×4 IMPLANT
BNDG COHESIVE 6X5 TAN STRL LF (GAUZE/BANDAGES/DRESSINGS) IMPLANT
BNDG ESMARK 6X9 LF (GAUZE/BANDAGES/DRESSINGS) ×4
BNDG GAUZE STRTCH 6 (GAUZE/BANDAGES/DRESSINGS) IMPLANT
CLOTH BEACON ORANGE TIMEOUT ST (SAFETY) ×4 IMPLANT
CORDS BIPOLAR (ELECTRODE) IMPLANT
COVER SURGICAL LIGHT HANDLE (MISCELLANEOUS) ×4 IMPLANT
CUFF TOURNIQUET SINGLE 18IN (TOURNIQUET CUFF) IMPLANT
CUFF TOURNIQUET SINGLE 24IN (TOURNIQUET CUFF) ×4 IMPLANT
CUFF TOURNIQUET SINGLE 34IN LL (TOURNIQUET CUFF) IMPLANT
CUFF TOURNIQUET SINGLE 44IN (TOURNIQUET CUFF) IMPLANT
DRAPE INCISE IOBAN 66X45 STRL (DRAPES) ×4 IMPLANT
DRAPE ORTHO SPLIT 77X108 STRL (DRAPES)
DRAPE PROXIMA HALF (DRAPES) ×4 IMPLANT
DRAPE SURG 17X23 STRL (DRAPES) IMPLANT
DRAPE SURG ORHT 6 SPLT 77X108 (DRAPES) IMPLANT
DRAPE U-SHAPE 47X51 STRL (DRAPES) ×4 IMPLANT
DRAPE U-SHAPE 76X120 STRL (DRAPES) ×4 IMPLANT
DURAPREP 26ML APPLICATOR (WOUND CARE) IMPLANT
ELECT CAUTERY BLADE 6.4 (BLADE) ×4 IMPLANT
ELECT REM PT RETURN 9FT ADLT (ELECTROSURGICAL) ×4
ELECTRODE REM PT RTRN 9FT ADLT (ELECTROSURGICAL) ×2 IMPLANT
EVACUATOR 1/8 PVC DRAIN (DRAIN) ×4 IMPLANT
FACESHIELD LNG OPTICON STERILE (SAFETY) ×4 IMPLANT
GAUZE XEROFORM 1X8 LF (GAUZE/BANDAGES/DRESSINGS) IMPLANT
GAUZE XEROFORM 5X9 LF (GAUZE/BANDAGES/DRESSINGS) ×4 IMPLANT
GLOVE SURG SS PI 7.5 STRL IVOR (GLOVE) ×8 IMPLANT
GOWN STRL NON-REIN LRG LVL3 (GOWN DISPOSABLE) ×4 IMPLANT
GOWN STRL REIN XL XLG (GOWN DISPOSABLE) ×4 IMPLANT
HANDPIECE INTERPULSE COAX TIP (DISPOSABLE)
KIT BASIN OR (CUSTOM PROCEDURE TRAY) ×4 IMPLANT
KIT ROOM TURNOVER OR (KITS) ×4 IMPLANT
MANIFOLD NEPTUNE II (INSTRUMENTS) ×4 IMPLANT
NS IRRIG 1000ML POUR BTL (IV SOLUTION) ×4 IMPLANT
PACK GENERAL/GYN (CUSTOM PROCEDURE TRAY) ×4 IMPLANT
PACK ORTHO EXTREMITY (CUSTOM PROCEDURE TRAY) IMPLANT
PAD ABD 8X10 STRL (GAUZE/BANDAGES/DRESSINGS) ×4 IMPLANT
PAD ARMBOARD 7.5X6 YLW CONV (MISCELLANEOUS) ×4 IMPLANT
PAD CAST 4YDX4 CTTN HI CHSV (CAST SUPPLIES) IMPLANT
PADDING CAST ABS 4INX4YD NS (CAST SUPPLIES)
PADDING CAST ABS COTTON 4X4 ST (CAST SUPPLIES) IMPLANT
PADDING CAST COTTON 4X4 STRL (CAST SUPPLIES)
PADDING CAST COTTON 6X4 STRL (CAST SUPPLIES) IMPLANT
SET HNDPC FAN SPRY TIP SCT (DISPOSABLE) IMPLANT
SPONGE GAUZE 4X4 12PLY (GAUZE/BANDAGES/DRESSINGS) ×8 IMPLANT
SPONGE LAP 18X18 X RAY DECT (DISPOSABLE) ×8 IMPLANT
STAPLER VISISTAT 35W (STAPLE) IMPLANT
STOCKINETTE IMPERVIOUS 9X36 MD (GAUZE/BANDAGES/DRESSINGS) ×4 IMPLANT
STOCKINETTE IMPERVIOUS LG (DRAPES) ×4 IMPLANT
SUT ETHILON 2 0 FS 18 (SUTURE) IMPLANT
SUT ETHILON 2 0 PSLX (SUTURE) ×4 IMPLANT
SUT ETHILON 3 0 PS 1 (SUTURE) ×8 IMPLANT
SUT PDS 0 CT 1 18  CR/8 (SUTURE) ×8 IMPLANT
SUT SILK 0 TIES 10X30 (SUTURE) IMPLANT
SUT SILK 2 0 SH CR/8 (SUTURE) IMPLANT
SUT VIC AB 2-0 CT1 27 (SUTURE) ×4
SUT VIC AB 2-0 CT1 TAPERPNT 27 (SUTURE) ×4 IMPLANT
SUT VIC AB 2-0 FS1 27 (SUTURE) ×4 IMPLANT
SYR CONTROL 10ML LL (SYRINGE) IMPLANT
TOWEL OR 17X24 6PK STRL BLUE (TOWEL DISPOSABLE) ×4 IMPLANT
TOWEL OR 17X26 10 PK STRL BLUE (TOWEL DISPOSABLE) ×4 IMPLANT
TUBE ANAEROBIC SPECIMEN COL (MISCELLANEOUS) IMPLANT
TUBE CONNECTING 12'X1/4 (SUCTIONS) ×1
TUBE CONNECTING 12X1/4 (SUCTIONS) ×3 IMPLANT
TUBE FEEDING 5FR 15 INCH (TUBING) IMPLANT
TUBING CYSTO DISP (UROLOGICAL SUPPLIES) ×4 IMPLANT
UNDERPAD 30X30 INCONTINENT (UNDERPADS AND DIAPERS) ×4 IMPLANT
WATER STERILE IRR 1000ML POUR (IV SOLUTION) ×4 IMPLANT
YANKAUER SUCT BULB TIP NO VENT (SUCTIONS) ×4 IMPLANT

## 2013-09-11 NOTE — Anesthesia Postprocedure Evaluation (Signed)
  Anesthesia Post-op Note  Patient: [name]  Procedure(s) Performed: Procedure(s): LEFT FOOT IRRIGATION AND DEBRIDEMENT (Left) AMPUTATION BELOW KNEE (Left)  Patient Location: PACU  Anesthesia Type:General  Level of Consciousness: awake, alert , oriented and patient cooperative  Airway and Oxygen Therapy: Patient Spontanous Breathing and Patient connected to nasal cannula oxygen  Post-op Pain: none  Post-op Assessment: Post-op Vital signs reviewed, Patient's Cardiovascular Status Stable, Respiratory Function Stable, Patent Airway and Pain level controlled, nausea improved  Post-op Vital Signs: Reviewed and stable  Complications: No apparent anesthesia complications

## 2013-09-11 NOTE — Preoperative (Signed)
Beta Blockers   Reason not to administer Beta Blockers:Not Applicable 

## 2013-09-11 NOTE — H&P (Signed)

## 2013-09-11 NOTE — Progress Notes (Signed)
PT Cancellation Note  Patient Details Name: Semajay Single MRN: [id number] DOB: Apr 20, 1956   Cancelled Treatment:    Reason Eval/Treat Not Completed: Pain limiting ability to participate;Fatigue/lethargy limiting ability to participate. Pt back from OR and declined OOB or sitting EOB at this time, stating he wants to feel better before he tries to get up. Will continue to follow.    Jolyn Lent 09/11/2013, 2:50 PM  Jolyn Lent, [place], DPT [phone]

## 2013-09-11 NOTE — Transfer of Care (Signed)
Immediate Anesthesia Transfer of Care Note  Patient: [name]  Procedure(s) Performed: Procedure(s): LEFT FOOT IRRIGATION AND DEBRIDEMENT (Left) AMPUTATION BELOW KNEE (Left)  Patient Location: PACU  Anesthesia Type:General  Level of Consciousness: awake, alert , oriented and patient cooperative  Airway & Oxygen Therapy: Patient Spontanous Breathing and Patient connected to face mask oxygen  Post-op Assessment: Report given to PACU RN, Post -op Vital signs reviewed and stable and Patient moving all extremities X 4  Post vital signs: Reviewed and stable  Complications: No apparent anesthesia complications

## 2013-09-11 NOTE — Op Note (Signed)
Date of surgery: 09/11/1998  Preoperative diagnosis: Left foot infection  Postoperative diagnosis: Same  Procedure: Left transtibial amputation  Surgeon: Eduard Roux, M.D.  Anesthesia: Gen.  Estimated blood loss: [id number] cc  Complications: None next  Condition PACU: Stable  Indication for procedure: [name] is a 58 year old gentleman who had a severe infection to his left foot that was debrided last week. He is brought back today for a repeat irrigation and debridement. However intraoperative findings were consistent with continued infection that tracked proximally. The decision was made to perform the transtibial amputation. The risks, benefits, and alternatives to surgery were discussed with the patient prior to the surgery and the patient wished to proceed. Next  Description of procedure: The patient was identified in preoperative holding area. The operative site was marked and confirmed by the patient.  He was brought back to the operating room. He was placed supine on the table. General anesthesia was induced by the anesthesiologist. A timeout was performed. Pre-incisional antibiotics were given. A nonsterile tourniquet was placed on the upper left thigh. Left lower extremity was prepped and draped in standard sterile fashion.  I use a fishmouth incision with a large posterior flap. Sharp dissection through the muscle was carried out using cautery. The neurovascular structures were identified and ligated. The tibia was osteotomized at approximately 17 cm distal to the joint line. The fibula was osteotomized approximately 2 cm proximal to the level of the tibial osteotomy. The ends of the bone were rounded off.  The tissues all appeared to be healthy and viable and without infection. The tourniquet was deflated to identify any remaining bleeding. Hemostasis was obtained. 6 L of normal saline was irrigated through the wound using cystoscopy tubing. The wound was then closed in layer fashion using  0 PDS for the fascia, 2-0 Vicryl for the deep skin layer. 2-0 nylon for the skin layer. A soft sterile dressing was applied.  The patient awoke from anesthesia uneventfully and was transferred to the PACU in stable condition.  Disposition: The patient will be nonweightbearing to the left lower extremity. He will not require any antibiotics going forward.

## 2013-09-11 NOTE — Anesthesia Preprocedure Evaluation (Signed)
Anesthesia Evaluation  Patient identified by MRN, date of birth, ID band Patient awake    Reviewed: Allergy & Precautions, H&P , NPO status , Patient's Chart, lab work & pertinent test results  History of Anesthesia Complications Negative for: history of anesthetic complications  Airway Mallampati: II TM Distance: >3 FB Neck ROM: Full    Dental  (+) Dental Advisory Given, Missing, Loose   Pulmonary neg pulmonary ROS,  breath sounds clear to auscultation        Cardiovascular hypertension (never needed BP meds), + Peripheral Vascular Disease Rhythm:Regular Rate:Normal     Neuro/Psych negative neurological ROS     GI/Hepatic negative GI ROS, (+) Hepatitis -, Unspecified  Endo/Other  diabetes (glu 181), Insulin Dependent  Renal/GU negative Renal ROS     Musculoskeletal   Abdominal   Peds  Hematology   Anesthesia Other Findings   Reproductive/Obstetrics                           Anesthesia Physical Anesthesia Plan  ASA: III  Anesthesia Plan: General   Post-op Pain Management:    Induction: Intravenous  Airway Management Planned: LMA  Additional Equipment:   Intra-op Plan:   Post-operative Plan:   Informed Consent: I have reviewed the patients History and Physical, chart, labs and discussed the procedure including the risks, benefits and alternatives for the proposed anesthesia with the patient or authorized representative who has indicated his/her understanding and acceptance.   Dental advisory given  Plan Discussed with: CRNA and Surgeon  Anesthesia Plan Comments: (Plan routine monitors, GA- LMA OK)        Anesthesia Quick Evaluation

## 2013-09-11 NOTE — Progress Notes (Signed)
[facility] PROGRESS NOTE  Jaydis William [id number] DOB: Dec 13, 1955 DOA: 09/01/2013 PCP: No PCP Per Patient  Brief narrative: [name] is a 58yo man with likely necrotizing infection of the left foot s/p Debridement on 2/3 by orthopedics. He is on Antibiotics and plan appears to be BKA early next week. He also has necrotic 1st, 2nd and 5th toes on the right.  Assessment/plan Suspected necrotizing infection of the left foot  - Currently on vancomycin, Zosyn and clindamycin started on 2/2 - MRI left foot confirmed necrotizing infection ,BC X 2 from 2/3 are NG. - Orthopedics is following along - On 2/3 Underwent debridement, fifth ray amputation, fourth toe through metatarsal amputation and wound vac application  - Status post BKA today Leukocytosis - At rest ABI was normal  - Continue pain management -  Black appearing toe-right foot, 1st, 2nd and 5th  - At rest ABI WNL  - Monitor clinically for changes -Per ortho.   Diabetic ketoacidosis  - Bicarb normalized.  - He has been transitioned to lantus and SSI.  - A1c 12.2 at admission, this is a new diagnosis  - will require Insulin on discharge.  -Continue lantus to 15 units  follow and titrate as appropriate  Hypokalemia  - K remains normal today, resolved  Hypophosphatemia  - Normal at last check, resolved  PseudoHypocalcemia due to hypoalbuminemia - Corrected Ca 9.2  DVT Prophylaxis:  SCD's   Consultants:  Orthopedics  PT/OT  DM Coordinator   Procedures: 2/3 by dr.Xu  1. Debridement of bone, muscle, skin, soft tissue.  2. Fifth ray amputation.  3. 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  4. Application of negative pressure wound therapy greater    2/11 5. Status post left BKA  Antibiotics:  Clindamycin - -> 2/2  Zosyn - -> 2/2  Vancomycin - -> 2/2  Code Status: Full  Family Communication: Pt at bedside  Disposition Plan: Will likely need SNF or CIR, pending surgery.     HPI/Subjective: States pain control, denies any new complaints   Objective: Filed Vitals:   09/11/13 1330 09/11/13 1345 09/11/13 1400 09/11/13 1434  BP: 140/74 139/73 143/70 126/68  Pulse: 78 74 75 82  Temp:    98.2 F (36.8 C)  TempSrc:      Resp: 19 15 13 16   Height:      Weight:      SpO2: 100% 94% 95% 97%    Intake/Output Summary (Last 24 hours) at 09/11/13 1902 Last data filed at 09/11/13 1700  Gross per 24 hour  Intake   1770 ml  Output    870 ml  Net    900 ml    Exam:   General:  Pt is groggy but easily aroused and oriented, in no apparent distress  Cardiovascular: Regular rate and rhythm, S1/S2  Respiratory: Clear to auscultation bilaterally  Abdomen: Soft, non tender, non distended, bowel sounds present  Extremities:  status post left BKA, dressing clean and dry  Data Reviewed: Basic Metabolic Panel:  Recent Labs Lab 09/05/13 0604  09/06/13 1417 09/07/13 0403 09/08/13 0425 09/09/13 0700 09/10/13 0445 09/11/13 0348  NA 144  < > 141 143 141 142 144 140  K 3.5*  < > 3.7 3.9 3.9 3.8 3.9 4.2  CL 109  < > 103 104 102 103 103 99  CO2 19  < > 23 25 26 26 24 27   GLUCOSE 101*  < > 251* 230* 243* 217* 166* 269*  BUN 8  < > 11 10 13 13 13 12   CREATININE 0.97  < > 0.92 1.06 1.00 0.98 0.97 1.06  CALCIUM 7.2*  < > 7.3* 7.5* 7.9* 8.1* 8.6 8.6  PHOS 1.7*  --  3.0  --   --   --   --   --   < > = values in this interval not displayed. Liver Function Tests:  Recent Labs Lab 09/06/13 1417  AST 16  ALT 14  ALKPHOS 114  BILITOT 0.2*  PROT 6.3  ALBUMIN 1.6*   CBC:  Recent Labs Lab 09/06/13 0609 09/08/13 0425 09/09/13 0700 09/10/13 0445 09/11/13 0348  WBC 14.4* 11.9* 12.0* 12.7* 10.5  HGB 9.8* 9.3* 9.6* 9.7* 9.5*  HCT 26.9* 26.9* 27.2* 28.1* 27.9*  MCV 83.0 85.9 86.3 86.5 88.0  PLT 149* 175 220 234 249   CBG:  Recent Labs Lab 09/10/13 1752 09/10/13 2124 09/11/13 0704 09/11/13 1023 09/11/13 1254  GLUCAP 244* 289* 238* 181* 195*     Recent Results (from the past 240 hour(s))  WOUND CULTURE     Status: None   Collection Time    09/01/13  8:18 PM      Result Value Ref Range Status   Specimen Description WOUND LEFT FOOT   Final   Special Requests NONE   Final   Gram Stain     Final   Value: NO WBC SEEN     NO SQUAMOUS EPITHELIAL CELLS SEEN     MODERATE GRAM POSITIVE COCCI IN PAIRS     MODERATE GRAM NEGATIVE RODS     Performed at [facility]   Culture     Final   Value: FEW PROTEUS MIRABILIS     MODERATE STAPHYLOCOCCUS SPECIES (COAGULASE NEGATIVE)     Performed at [facility]   Report Status 09/07/2013 FINAL   Final   Organism ID, Bacteria PROTEUS MIRABILIS   Final  ANAEROBIC CULTURE     Status: None   Collection Time    09/03/13 12:03 PM      Result Value Ref Range Status   Specimen Description ABSCESS FOOT LEFT   Final   Special Requests PT ON VANCO ZOSYN   Final   Gram Stain     Final   Value: NO WBC SEEN     NO SQUAMOUS EPITHELIAL CELLS SEEN     MODERATE GRAM POSITIVE COCCI IN PAIRS     Performed at [facility]   Culture     Final   Value: NO ANAEROBES ISOLATED     Performed at [facility]   Report Status 09/08/2013 FINAL   Final  CULTURE, ROUTINE-ABSCESS     Status: None   Collection Time    09/03/13 12:03 PM      Result Value Ref Range Status   Specimen Description ABSCESS FOOT LEFT   Final   Special Requests PT ON VANCO ZOSYN   Final   Gram Stain     Final   Value: NO WBC SEEN     NO SQUAMOUS EPITHELIAL CELLS SEEN     MODERATE GRAM POSITIVE COCCI IN PAIRS     Performed at [facility]   Culture     Final   Value: MODERATE PROTEUS MIRABILIS     Performed at [facility]   Report Status 09/07/2013 FINAL   Final   Organism ID, Bacteria PROTEUS MIRABILIS   Final  CULTURE, BLOOD (ROUTINE X 2)  Status: None   Collection Time    09/03/13 10:00 PM      Result Value Ref Range Status   Specimen Description BLOOD RIGHT HAND   Final    Special Requests BOTTLES DRAWN AEROBIC ONLY 3CC   Final   Culture  Setup Time     Final   Value: 09/04/2013 03:30     Performed at [facility]   Culture     Final   Value: NO GROWTH 5 DAYS     Performed at [facility]   Report Status 09/10/2013 FINAL   Final  CULTURE, BLOOD (ROUTINE X 2)     Status: None   Collection Time    09/03/13 10:05 PM      Result Value Ref Range Status   Specimen Description BLOOD LEFT HAND   Final   Special Requests BOTTLES DRAWN AEROBIC ONLY 4CC   Final   Culture  Setup Time     Final   Value: 09/04/2013 03:30     Performed at [facility]   Culture     Final   Value: NO GROWTH 5 DAYS     Performed at [facility]   Report Status 09/10/2013 FINAL   Final  SURGICAL PCR SCREEN     Status: None   Collection Time    09/11/13  9:38 AM      Result Value Ref Range Status   MRSA, PCR NEGATIVE  NEGATIVE Final   Staphylococcus aureus NEGATIVE  NEGATIVE Final   Comment:            The Xpert SA Assay (FDA     approved for NASAL specimens     in patients over 42 years of age),     is one component of     a comprehensive surveillance     program.  Test performance has     been validated by [employer] for patients greater     than or equal to 50 year old.     It is not intended     to diagnose infection nor to     guide or monitor treatment.     Scheduled Meds: . clindamycin (CLEOCIN) IV  600 mg Intravenous 3 times per day  . feeding supplement (GLUCERNA SHAKE)  237 mL Oral BID BM  . HYDROmorphone      . insulin aspart  0-20 Units Subcutaneous TID WC  . insulin aspart  0-5 Units Subcutaneous QHS  . insulin glargine  15 Units Subcutaneous QHS  . piperacillin-tazobactam (ZOSYN)  IV  3.375 g Intravenous Q8H  . sodium chloride  3 mL Intravenous Q12H  . vancomycin  1,000 mg Intravenous Q12H   Continuous Infusions:    Sheila Oats, MD  [facility] Pager [phone]  If 7PM-7AM, please contact  night-coverage [email or web address] Password [email or web address] 09/11/2013, 7:02 PM   LOS: 10 days

## 2013-09-11 NOTE — Progress Notes (Signed)
PT Cancellation Note  Patient Details Name: [name] MRN: [id number] DOB: 03-Dec-1955   Cancelled Treatment:    Reason Eval/Treat Not Completed: Patient at procedure or test/unavailable. Will continue to follow.   Jolyn Lent 09/11/2013, 12:35 PM  Jolyn Lent, [place], DPT [phone]

## 2013-09-11 NOTE — Progress Notes (Signed)
ANTIBIOTIC CONSULT NOTE - FOLLOW UP  Pharmacy Consult for vancomycin  Indication: Suspected necrotizing infection of left foot  No Known Allergies  Patient Measurements: Height: 5\' 3"  (160 cm) Weight: 148 lb (67.132 kg) IBW/kg (Calculated) : 56.9  Vital Signs: Temp: 98.2 F (36.8 C) (02/11 1434) Temp src: Oral (02/11 0541) BP: 126/68 mmHg (02/11 1434) Pulse Rate: 82 (02/11 1434) Intake/Output from previous day: 02/10 0701 - 02/11 0700 In: 3120 [P.O.:920; IV Piggyback:2200] Out: 2345 [Urine:2300; Drains:45] Intake/Output from this shift: Total I/O In: 700 [I.V.:700] Out: -   Labs:  Recent Labs  09/09/13 0700 09/10/13 0445 09/11/13 0348  WBC 12.0* 12.7* 10.5  HGB 9.6* 9.7* 9.5*  PLT 220 234 249  CREATININE 0.98 0.97 1.06   Estimated Creatinine Clearance: 63.4 ml/min (by C-G formula based on Cr of 1.06). No results found for this basename: VANCOTROUGH, VANCOPEAK, VANCORANDOM, GENTTROUGH, GENTPEAK, GENTRANDOM, TOBRATROUGH, TOBRAPEAK, TOBRARND, AMIKACINPEAK, AMIKACINTROU, AMIKACIN,  in the last 72 hours    Assessment: 58 year old male with necrotizing foot infection, planned for BKA this week. Tmax 99.7, wbc 10.5 (down). SCr stable. S/p I&D today (2/11) Vancomycin trough on 2/6 was therapeutic at 16 on Vanc 1 gm IV Q 12 h.   No dose adjustments warranted for zosyn or clinda. Would limit length of therapy of clinda dosing to reduce risk of cdiff and does not likely add much additional coverage to vancomycin and zosyn.   2/1 wound cx - few proteus- pan sens, moderate coag negative staph 2/3 Abscess L foot>> proteus - pan sens 2/3 BC X 2>>neg  Goal of Therapy:  Vancomycin 15-20  Plan:  1. Continue Vancomycin 1g IV q12h  2. Continue Clinda 600mg  IV q8h 3. Zosyn 3.375 gm IV Q 12 hours  4. Monitor renal function, cultures and clinical course    Albertina Parr, PharmD.  Clinical Pharmacist Pager [phone]

## 2013-09-12 ENCOUNTER — Encounter (HOSPITAL_COMMUNITY): Payer: Self-pay | Admitting: Orthopaedic Surgery

## 2013-09-12 DIAGNOSIS — I739 Peripheral vascular disease, unspecified: Secondary | ICD-10-CM

## 2013-09-12 DIAGNOSIS — L98499 Non-pressure chronic ulcer of skin of other sites with unspecified severity: Secondary | ICD-10-CM

## 2013-09-12 DIAGNOSIS — S88119A Complete traumatic amputation at level between knee and ankle, unspecified lower leg, initial encounter: Secondary | ICD-10-CM

## 2013-09-12 LAB — CBC
HCT: 25.2 % — ABNORMAL LOW (ref 39.0–52.0)
HEMOGLOBIN: 8.6 g/dL — AB (ref 13.0–17.0)
MCH: 29.8 pg (ref 26.0–34.0)
MCHC: 34.1 g/dL (ref 30.0–36.0)
MCV: 87.2 fL (ref 78.0–100.0)
Platelets: 235 10*3/uL (ref 150–400)
RBC: 2.89 MIL/uL — AB (ref 4.22–5.81)
RDW: 13.1 % (ref 11.5–15.5)
WBC: 15.1 10*3/uL — ABNORMAL HIGH (ref 4.0–10.5)

## 2013-09-12 LAB — GLUCOSE, CAPILLARY
GLUCOSE-CAPILLARY: 214 mg/dL — AB (ref 70–99)
GLUCOSE-CAPILLARY: 259 mg/dL — AB (ref 70–99)
GLUCOSE-CAPILLARY: 267 mg/dL — AB (ref 70–99)
Glucose-Capillary: 180 mg/dL — ABNORMAL HIGH (ref 70–99)
Glucose-Capillary: 255 mg/dL — ABNORMAL HIGH (ref 70–99)

## 2013-09-12 MED ORDER — ASPIRIN EC 325 MG PO TBEC
325.0000 mg | DELAYED_RELEASE_TABLET | Freq: Every day | ORAL | Status: DC
  Administered 2013-09-12 – 2013-09-13 (×2): 325 mg via ORAL
  Filled 2013-09-12 (×3): qty 1

## 2013-09-12 MED ORDER — INSULIN GLARGINE 100 UNIT/ML ~~LOC~~ SOLN
18.0000 [IU] | Freq: Every day | SUBCUTANEOUS | Status: DC
  Administered 2013-09-12: 18 [IU] via SUBCUTANEOUS
  Filled 2013-09-12 (×2): qty 0.18

## 2013-09-12 NOTE — Progress Notes (Signed)
Inpatient Diabetes Program Recommendations  AACE/ADA: New Consensus Statement on Inpatient Glycemic Control (2013)  Target Ranges:  Prepandial:   less than 140 mg/dL      Peak postprandial:   less than 180 mg/dL (1-2 hours)      Critically ill patients:  140 - 180 mg/dL   Reason for Visit: Hyperglycemia  Results for MOSHEH, EYMARD (MRN [id number]) as of 09/12/2013 15:05  Ref. Range 09/11/2013 18:25 09/11/2013 21:23 09/12/2013 05:13 09/12/2013 06:40 09/12/2013 11:37  Glucose-Capillary Latest Range: 70-99 mg/dL 276 (H) 196 (H) 180 (H) 214 (H) 259 (H)   Please consider addition of meal coverage insulin - Novolog 4 units tidwc.  Thank you. Lorenda Peck, RD, LDN, CDE Inpatient Diabetes Coordinator [phone]

## 2013-09-12 NOTE — Progress Notes (Signed)
Physical Therapy Treatment Patient Details Name: [name] MRN: [id number] DOB: May 07, 1956 Today's Date: 09/12/2013 Time: [id number] PT Time Calculation (min): 20 min  PT Assessment / Plan / Recommendation  History of Present Illness  Patient is a 58 y.o. male who was adm to the hospital and states that he had foot swelling since last week. Patient became incontinent of urine, feeling lightheaded, blurred vision, patient states that he started to loose balance as well. Denies any significant chest pain. Patient states that his feet are numb since last year. Patient has been wearing same socks and tenniss shoes for past 3 months at least. Patient was found to have ulcerations of left foot. 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 States he feels it is easier to ambulate now with BKA, and demonstrated the ability to perform therapeutic exercise in supine and sidelying. Continue to recommend CIR at d/c, as pt states he is motivated to get better and is looking forward to getting a prosthesis.   Follow Up Recommendations  CIR     Does the patient have the potential to tolerate intense rehabilitation     Barriers to Discharge        Equipment Recommendations       Recommendations for Other Services Rehab consult  Frequency Min 4X/week   Progress towards PT Goals Progress towards PT goals: Progressing toward goals  Plan Frequency needs to be updated    Precautions / Restrictions Precautions Precautions: Fall Restrictions Weight Bearing Restrictions: Yes LLE Weight Bearing: Non weight bearing   Pertinent Vitals/Pain 8/10 throughout session. Pt states he does not want any pain medication.    Mobility  Bed Mobility Overal bed mobility: Modified Independent Bed Mobility: Sit to Supine General bed mobility comments: Use of bedrails for support. No physical assist required.   Transfers Overall transfer level: Needs assistance Equipment used: Rolling walker (2 wheeled) Transfers: Sit to/from Stand Sit to Stand: Min guard General transfer comment: Pt demonstrated proper hand placement and safety awareness. Ambulation/Gait Ambulation/Gait assistance: Min guard Ambulation Distance (Feet): 12 Feet Assistive device: Rolling walker (2 wheeled) Gait Pattern/deviations:  (2-point gait pattern with RW) Gait velocity: Decreased Gait velocity interpretation: Below normal speed for age/gender General Gait Details: VC's to slow down and focus on technique. Pt was able to ambulate around the room well, however if going outside the room would have a chair follow.     Exercises General Exercises - Lower Extremity Ankle Circles/Pumps: 10 reps Quad Sets: 10 reps;Right;Left Hip ABduction/ADduction: Left;Right;10 reps;Supine Straight Leg Raises: 10 reps;Right;Left Other Exercises Other Exercises: In sidelying, pt performed hip abduction and hip extension with LLE. Other Exercises: Pt is very aware of his handouts and how to do theraband exercises, he returned demostrated for me.   PT Diagnosis:    PT Problem List:   PT Treatment Interventions:     PT Goals (current goals can now be found in the care plan section) Acute Rehab PT Goals Patient Stated Goal: To walk with a prosthesis. PT Goal Formulation: With patient Time For Goal Achievement: 09/18/13 Potential to Achieve Goals: Good  Visit Information  Last PT Received On: 09/12/13 Assistance Needed: +2 (Chair follow for outside room) History of Present Illness:  Patient is a 58 y.o. male who was adm to the hospital and states that he had foot swelling since last week. Patient became incontinent of urine, feeling  lightheaded, blurred vision, patient states that he started to loose balance as well. Denies any significant chest pain. Patient states that his feet are numb since last year. Patient has been wearing same  socks and tenniss shoes for past 3 months at least. Patient was found to have ulcerations of left foot. 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   Cognition  Cognition Arousal/Alertness: Awake/alert Behavior During Therapy: WFL for tasks assessed/performed Overall Cognitive Status: Within Functional Limits for tasks assessed    Balance  Balance Overall balance assessment: Needs assistance Sitting-balance support: Feet supported;Bilateral upper extremity supported Sitting balance-Leahy Scale: Good Standing balance support: Bilateral upper extremity supported Standing balance-Leahy Scale: Poor  End of Session PT - End of Session Equipment Utilized During Treatment: Gait belt Activity Tolerance: Patient tolerated treatment well Patient left: in bed;with call bell/phone within reach Nurse Communication: Mobility status   GP     Jolyn Lent 09/12/2013, 4:10 PM  Jolyn Lent, [place], DPT [phone]

## 2013-09-12 NOTE — Progress Notes (Signed)
   Subjective:  Patient reports pain as mild.    Objective:   VITALS:   Filed Vitals:   09/11/13 1434 09/11/13 2110 09/12/13 0205 09/12/13 0523  BP: 126/68 116/66 118/61 139/68  Pulse: 82 101 89 83  Temp: 98.2 F (36.8 C) 98.6 F (37 C) 98.8 F (37.1 C) 99.5 F (37.5 C)  TempSrc:  Oral Oral Oral  Resp: 16 18 18 18   Height:      Weight:      SpO2: 97% 98% 98% 99%    Neurologically intact Neurovascular intact Sensation intact distally Incision: dressing C/D/I and no drainage No cellulitis present Compartment soft   Lab Results  Component Value Date   WBC 10.5 09/11/2013   HGB 9.5* 09/11/2013   HCT 27.9* 09/11/2013   MCV 88.0 09/11/2013   PLT 249 09/11/2013     Assessment/Plan: 1 Day Post-Op   Problem List Items Addressed This Visit     Endocrine   DM (diabetes mellitus) type II controlled, neurological manifestation   Relevant Medications      insulin aspart (novoLOG) injection 0-20 Units      insulin aspart (novoLOG) injection 0-5 Units      insulin glargine (LANTUS) injection 15 Units   DKA (diabetic ketoacidoses)     Other   Abscess of foot   Cellulitis and abscess of foot    Other Visit Diagnoses   Left foot infection    -  Primary    Relevant Medications       ceFAZolin (ANCEF) IVPB 1 g/50 mL premix (Completed)       vancomycin (VANCOCIN) IVPB 1000 mg/200 mL premix (Completed)       clindamycin (CLEOCIN) IVPB 600 mg       vancomycin (VANCOCIN) IVPB 1000 mg/200 mL premix    Right foot infection        Hyponatremia        Hypochloremia        Diabetes mellitus type 2, uncontrolled, with complications        Necrotizing fasciitis        Hypokalemia        Hypophosphatemia           Expected postop acute blood loss anemia - will monitor for symptoms Up with PT/OT DVT ppx - SCDs, ambulation, asa NWB left lower extremity HVAC d/c'ed Pain control   Marianna Payment 09/12/2013, 8:26 AM [phone]

## 2013-09-12 NOTE — Progress Notes (Signed)
Inpatient Diabetes Program Recommendations  AACE/ADA: New Consensus Statement on Inpatient Glycemic Control (2013)  Target Ranges:  Prepandial:   less than 140 mg/dL      Peak postprandial:   less than 180 mg/dL (1-2 hours)      Critically ill patients:  140 - 180 mg/dL   Reason for Visit: Hyperglycemia  Diabetes history: None Outpatient Diabetes medications: Lantus 30 units QHS and Current orders for Inpatient glycemic control: Lantus 15 QHS and Novolog resistant tidwc and hs  Results for ENES, HUDZIK (MRN [id number]) as of 09/12/2013 09:49  Ref. Range 09/11/2013 12:54 09/11/2013 18:25 09/11/2013 21:23 09/12/2013 05:13 09/12/2013 06:40  Glucose-Capillary Latest Range: 70-99 mg/dL 195 (H) 276 (H) 196 (H) 180 (H) 214 (H)    Inpatient Diabetes Program Recommendations Insulin - Basal: Increase Lantus to 20 units QHS Insulin - Meal Coverage: Please consider addition of Novolog 4 units tidwc for meal coverage insulin if pt eats >50%.  Note: Will continue to follow. Thank you. Lorenda Peck, RD, LDN, CDE Inpatient Diabetes Coordinator [phone]

## 2013-09-12 NOTE — Progress Notes (Signed)
Orthopedic Tech Progress Note Patient Details:  [name] 1956/02/06 [id number]  Ortho Devices Ortho Device/Splint Location: put ohf on bed Ortho Device/Splint Interventions: Ordered;Application   [name] 09/12/2013, 10:21 PM

## 2013-09-12 NOTE — Progress Notes (Signed)
Pt was able to stick on finger for blood sugar monitoring prior lunch. Pt was unable to obtain large enough blood drop. Will retry prior to supper. Instructed pt on drawing up insulin. Pt is unable to see the syringe well enough to safely draw up insulin. Will continue to monitor and rept to MD.

## 2013-09-12 NOTE — Progress Notes (Signed)
Occupational Therapy Treatment Patient Details Name: [name] MRN: [id number] DOB: January 30, 1956 Today's Date: 09/12/2013 Time: [id number] OT Time Calculation (min): 18 min  OT Assessment / Plan / Recommendation  History of present illness  Patient is a 58 y.o. male who was adm to the hospital and states that he had foot swelling since last week. Patient became incontinent of urine, feeling lightheaded, blurred vision, patient states that he started to loose balance as well. Denies any significant chest pain. Patient states that his feet are numb since last year. Patient has been wearing same socks and tenniss shoes for past 3 months at least. Patient was found to have ulcerations of left foot. 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 Acute OT will continue to follow.  Follow Up Recommendations  CIR;Supervision/Assistance - 24 hour       Equipment Recommendations  3 in 1 bedside comode;Wheelchair (measurements OT);Wheelchair cushion (measurements OT)       Frequency Min 2X/week   Progress towards OT Goals Progress towards OT goals: Progressing toward goals  Plan Discharge plan remains appropriate    Precautions / Restrictions Precautions Precautions: Fall Restrictions Weight Bearing Restrictions: Yes LLE Weight Bearing: Non weight bearing   Pertinent Vitals/Pain No c/o pain    ADL  Lower Body Dressing: Moderate assistance Where Assessed - Lower Body Dressing: Supported sit to Lobbyist: Minimal assistance Toilet Transfer Method: Sit to stand (hop) Science writer:  (Bed>3 hops to recliner going to his right)      OT Goals(current goals can now be found in the care plan section)    Visit Information  Last OT Received On:  09/12/13 Assistance Needed: +2 (only to follow with recliner) History of Present Illness:  Patient is a 58 y.o. male who was adm to the hospital and states that he had foot swelling since last week. Patient became incontinent of urine, feeling lightheaded, blurred vision, patient states that he started to loose balance as well. Denies any significant chest pain. Patient states that his feet are numb since last year. Patient has been wearing same socks and tenniss shoes for past 3 months at least. Patient was found to have ulcerations of left foot. 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/13          Cognition  Cognition Arousal/Alertness: Awake/alert Behavior During Therapy: WFL for tasks assessed/performed Overall Cognitive Status: Within Functional Limits for tasks assessed    Mobility  Bed Mobility Overal bed mobility: Modified Independent General bed mobility comments: Mod I with HOB raised using the rail Transfers Overall transfer level: Needs assistance Equipment used: Rolling walker (2 wheeled) Transfers: Sit to/from Stand Sit to Stand: Min assist General transfer comment: Hop to recliner minA    Exercises  Other Exercises Other Exercises: Pt is very aware of his handouts and how to do theraband exercises, he returned demostrated for me.   Balance Balance Sitting-balance support: Single extremity supported (RLE supported) Sitting balance-Leahy Scale: Fair Standing balance support: Bilateral upper extremity supported (RLE only) Standing balance-Leahy Scale: Poor  End of Session OT - End of Session Equipment Utilized During Treatment: Gait belt;Rolling walker Activity Tolerance: Patient tolerated treatment well Patient left: in chair (  towel roll under distal part of LLE (about 4 inches above distal aspect of residual limb) to facilitate knee extension and provide elevation)       Almon Register [id number] 09/12/2013, 1:03 PM

## 2013-09-12 NOTE — Progress Notes (Addendum)
[facility] PROGRESS NOTE  [name] [id number] DOB: 1955-09-03 DOA: 09/01/2013 PCP: No PCP Per Patient  Brief narrative: [name] is a 58yo man with likely necrotizing infection of the left foot s/p Debridement on 2/3 by orthopedics. He is on Antibiotics and plan appears to be BKA early next week. He also has necrotic 1st, 2nd and 5th toes on the right.  Assessment/plan Suspected necrotizing infection of the left foot  - Currently on vancomycin, Zosyn and clindamycin started on 2/2 - MRI left foot confirmed necrotizing infection ,BC X 2 from 2/3 are NG. - At rest ABI was normal  - On 2/3 Underwent debridement, fifth ray amputation, fourth toe through metatarsal amputation and wound vac application  - Status post BKA 2/11, hemoglobin 8.6 today-follow -WBC back up today- 15-is afebrile and hemodynamically, follow and recheck - Continue pain management - Appreciate Orthopedics assisatance  -Awaiting CIR eval Black appearing toe-right foot, 1st, 2nd and 5th  - At rest ABI WNL  -Per ortho.   Diabetic ketoacidosis  - Bicarb normalized.  - He has been transitioned to lantus and SSI.  - A1c 12.2 at admission, this is a new diagnosis  - will require Insulin on discharge.  -sub-optimal blood glucose control, will increase Lantus to 18 units today  follow and titrate as appropriate  Hypokalemia  - resolved, follow  Hypophosphatemia  - Normal at last check, resolved  PseudoHypocalcemia due to hypoalbuminemia - Corrected Ca 9.2  DVT Prophylaxis:  SCD's   Consultants:  Orthopedics  PT/OT  DM Coordinator   Procedures: 2/3 by dr.Xu  1. Debridement of bone, muscle, skin, soft tissue.  2. Fifth ray amputation.  3. 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  4. Application of negative pressure wound therapy greater    2/11 5. Status post left BKA  Antibiotics:  Clindamycin - -> 2/2  Zosyn - -> 2/2  Vancomycin - -> 2/2  Code Status: Full   Family Communication: Pt at bedside  Disposition Plan: Will likely need SNF or CIR-awaiting eval   HPI/Subjective: More alert this am, complaining of phantom pain(left foot)  Objective: Filed Vitals:   09/11/13 1434 09/11/13 2110 09/12/13 0205 09/12/13 0523  BP: 126/68 116/66 118/61 139/68  Pulse: 82 101 89 83  Temp: 98.2 F (36.8 C) 98.6 F (37 C) 98.8 F (37.1 C) 99.5 F (37.5 C)  TempSrc:  Oral Oral Oral  Resp: 16 18 18 18   Height:      Weight:      SpO2: 97% 98% 98% 99%    Intake/Output Summary (Last 24 hours) at 09/12/13 1143 Last data filed at 09/12/13 1100  Gross per 24 hour  Intake   2140 ml  Output   1970 ml  Net    170 ml    Exam:   General:  Alert and oriented x3but easilyin no apparent distress  Cardiovascular: Regular rate and rhythm, S1/S2  Respiratory: Clear to auscultation bilaterally  Abdomen: Soft, non tender, non distended, bowel sounds present  Extremities:  status post left BKA, dressing clean and dry  Data Reviewed: Basic Metabolic Panel:  Recent Labs Lab 09/06/13 1417 09/07/13 0403 09/08/13 0425 09/09/13 0700 09/10/13 0445 09/11/13 0348  NA 141 143 141 142 144 140  K 3.7 3.9 3.9 3.8 3.9 4.2  CL 103 104 102 103 103 99  CO2 23 25 26 26 24 27   GLUCOSE 251* 230* 243* 217* 166* 269*  BUN 11 10 13 13 13  12  CREATININE 0.92 1.06 1.00 0.98 0.97 1.06  CALCIUM 7.3* 7.5* 7.9* 8.1* 8.6 8.6  PHOS 3.0  --   --   --   --   --    Liver Function Tests:  Recent Labs Lab 09/06/13 1417  AST 16  ALT 14  ALKPHOS 114  BILITOT 0.2*  PROT 6.3  ALBUMIN 1.6*   CBC:  Recent Labs Lab 09/08/13 0425 09/09/13 0700 09/10/13 0445 09/11/13 0348 09/12/13 1026  WBC 11.9* 12.0* 12.7* 10.5 15.1*  HGB 9.3* 9.6* 9.7* 9.5* 8.6*  HCT 26.9* 27.2* 28.1* 27.9* 25.2*  MCV 85.9 86.3 86.5 88.0 87.2  PLT 175 220 234 249 235   CBG:  Recent Labs Lab 09/11/13 1825 09/11/13 2123 09/12/13 0513 09/12/13 0640 09/12/13 1137  GLUCAP 276* 196* 180*  214* 259*    Recent Results (from the past 240 hour(s))  ANAEROBIC CULTURE     Status: None   Collection Time    09/03/13 12:03 PM      Result Value Ref Range Status   Specimen Description ABSCESS FOOT LEFT   Final   Special Requests PT ON VANCO ZOSYN   Final   Gram Stain     Final   Value: NO WBC SEEN     NO SQUAMOUS EPITHELIAL CELLS SEEN     MODERATE GRAM POSITIVE COCCI IN PAIRS     Performed at [facility]   Culture     Final   Value: NO ANAEROBES ISOLATED     Performed at [facility]   Report Status 09/08/2013 FINAL   Final  CULTURE, ROUTINE-ABSCESS     Status: None   Collection Time    09/03/13 12:03 PM      Result Value Ref Range Status   Specimen Description ABSCESS FOOT LEFT   Final   Special Requests PT ON VANCO ZOSYN   Final   Gram Stain     Final   Value: NO WBC SEEN     NO SQUAMOUS EPITHELIAL CELLS SEEN     MODERATE GRAM POSITIVE COCCI IN PAIRS     Performed at [facility]   Culture     Final   Value: MODERATE PROTEUS MIRABILIS     Performed at [facility]   Report Status 09/07/2013 FINAL   Final   Organism ID, Bacteria PROTEUS MIRABILIS   Final  CULTURE, BLOOD (ROUTINE X 2)     Status: None   Collection Time    09/03/13 10:00 PM      Result Value Ref Range Status   Specimen Description BLOOD RIGHT HAND   Final   Special Requests BOTTLES DRAWN AEROBIC ONLY 3CC   Final   Culture  Setup Time     Final   Value: 09/04/2013 03:30     Performed at [facility]   Culture     Final   Value: NO GROWTH 5 DAYS     Performed at [facility]   Report Status 09/10/2013 FINAL   Final  CULTURE, BLOOD (ROUTINE X 2)     Status: None   Collection Time    09/03/13 10:05 PM      Result Value Ref Range Status   Specimen Description BLOOD LEFT HAND   Final   Special Requests BOTTLES DRAWN AEROBIC ONLY 4CC   Final   Culture  Setup Time     Final   Value: 09/04/2013 03:30     Performed at [facility]  Culture      Final   Value: NO GROWTH 5 DAYS     Performed at [facility]   Report Status 09/10/2013 FINAL   Final  SURGICAL PCR SCREEN     Status: None   Collection Time    09/11/13  9:38 AM      Result Value Ref Range Status   MRSA, PCR NEGATIVE  NEGATIVE Final   Staphylococcus aureus NEGATIVE  NEGATIVE Final   Comment:            The Xpert SA Assay (FDA     approved for NASAL specimens     in patients over 20 years of age),     is one component of     a comprehensive surveillance     program.  Test performance has     been validated by [employer] for patients greater     than or equal to 47 year old.     It is not intended     to diagnose infection nor to     guide or monitor treatment.     Scheduled Meds: . aspirin EC  325 mg Oral Daily  . clindamycin (CLEOCIN) IV  600 mg Intravenous 3 times per day  . feeding supplement (GLUCERNA SHAKE)  237 mL Oral BID BM  . insulin aspart  0-20 Units Subcutaneous TID WC  . insulin aspart  0-5 Units Subcutaneous QHS  . insulin glargine  15 Units Subcutaneous QHS  . piperacillin-tazobactam (ZOSYN)  IV  3.375 g Intravenous Q8H  . sodium chloride  3 mL Intravenous Q12H  . vancomycin  1,000 mg Intravenous Q12H   Continuous Infusions:    Sheila Oats, MD  [facility] Pager [phone]  If 7PM-7AM, please contact night-coverage [email or web address] Password [facility] 09/12/2013, 11:43 AM   LOS: 11 days

## 2013-09-12 NOTE — Progress Notes (Signed)
Pt has diabetes start up kit. Pt repts that he has received all the diabetes handouts and has watched all the diabetes movies. Will have pt return demonstrate finger stick prior lunch with out CBG machine and will have pt draw up and administer his insulin prior to lunch. Pt is agreeable and willing to learn.

## 2013-09-13 ENCOUNTER — Encounter (HOSPITAL_COMMUNITY): Payer: Self-pay | Admitting: Physical Medicine and Rehabilitation

## 2013-09-13 ENCOUNTER — Inpatient Hospital Stay (HOSPITAL_COMMUNITY)
Admission: RE | Admit: 2013-09-13 | Discharge: 2013-10-22 | DRG: 945 | Disposition: A | Discharge status: Nursing Home (Includes ICF) | Payer: Medicaid Other | Source: Intra-hospital | Attending: Physical Medicine & Rehabilitation | Admitting: Physical Medicine & Rehabilitation

## 2013-09-13 DIAGNOSIS — E1142 Type 2 diabetes mellitus with diabetic polyneuropathy: Secondary | ICD-10-CM

## 2013-09-13 DIAGNOSIS — J069 Acute upper respiratory infection, unspecified: Secondary | ICD-10-CM | POA: Diagnosis not present

## 2013-09-13 DIAGNOSIS — Z5189 Encounter for other specified aftercare: Secondary | ICD-10-CM | POA: Diagnosis present

## 2013-09-13 DIAGNOSIS — R509 Fever, unspecified: Secondary | ICD-10-CM | POA: Diagnosis present

## 2013-09-13 DIAGNOSIS — S88119A Complete traumatic amputation at level between knee and ankle, unspecified lower leg, initial encounter: Secondary | ICD-10-CM

## 2013-09-13 DIAGNOSIS — B964 Proteus (mirabilis) (morganii) as the cause of diseases classified elsewhere: Secondary | ICD-10-CM

## 2013-09-13 DIAGNOSIS — R079 Chest pain, unspecified: Secondary | ICD-10-CM

## 2013-09-13 DIAGNOSIS — B9789 Other viral agents as the cause of diseases classified elsewhere: Secondary | ICD-10-CM | POA: Diagnosis not present

## 2013-09-13 DIAGNOSIS — E1310 Other specified diabetes mellitus with ketoacidosis without coma: Secondary | ICD-10-CM

## 2013-09-13 DIAGNOSIS — I96 Gangrene, not elsewhere classified: Secondary | ICD-10-CM | POA: Diagnosis not present

## 2013-09-13 DIAGNOSIS — I10 Essential (primary) hypertension: Secondary | ICD-10-CM

## 2013-09-13 DIAGNOSIS — D638 Anemia in other chronic diseases classified elsewhere: Secondary | ICD-10-CM | POA: Diagnosis not present

## 2013-09-13 DIAGNOSIS — E1149 Type 2 diabetes mellitus with other diabetic neurological complication: Secondary | ICD-10-CM

## 2013-09-13 DIAGNOSIS — E119 Type 2 diabetes mellitus without complications: Secondary | ICD-10-CM | POA: Diagnosis present

## 2013-09-13 DIAGNOSIS — I739 Peripheral vascular disease, unspecified: Secondary | ICD-10-CM

## 2013-09-13 DIAGNOSIS — L089 Local infection of the skin and subcutaneous tissue, unspecified: Secondary | ICD-10-CM

## 2013-09-13 DIAGNOSIS — L02619 Cutaneous abscess of unspecified foot: Secondary | ICD-10-CM

## 2013-09-13 DIAGNOSIS — R1110 Vomiting, unspecified: Secondary | ICD-10-CM

## 2013-09-13 DIAGNOSIS — E1110 Type 2 diabetes mellitus with ketoacidosis without coma: Secondary | ICD-10-CM

## 2013-09-13 DIAGNOSIS — L03119 Cellulitis of unspecified part of limb: Secondary | ICD-10-CM

## 2013-09-13 DIAGNOSIS — E1169 Type 2 diabetes mellitus with other specified complication: Secondary | ICD-10-CM | POA: Diagnosis not present

## 2013-09-13 DIAGNOSIS — M726 Necrotizing fasciitis: Secondary | ICD-10-CM

## 2013-09-13 DIAGNOSIS — L98499 Non-pressure chronic ulcer of skin of other sites with unspecified severity: Secondary | ICD-10-CM

## 2013-09-13 DIAGNOSIS — I951 Orthostatic hypotension: Secondary | ICD-10-CM

## 2013-09-13 DIAGNOSIS — T40605A Adverse effect of unspecified narcotics, initial encounter: Secondary | ICD-10-CM

## 2013-09-13 DIAGNOSIS — E11628 Type 2 diabetes mellitus with other skin complications: Secondary | ICD-10-CM | POA: Diagnosis present

## 2013-09-13 LAB — CBC WITH DIFFERENTIAL/PLATELET
BASOS PCT: 0 % (ref 0–1)
Basophils Absolute: 0 10*3/uL (ref 0.0–0.1)
Eosinophils Absolute: 0.2 10*3/uL (ref 0.0–0.7)
Eosinophils Relative: 2 % (ref 0–5)
HCT: 24.6 % — ABNORMAL LOW (ref 39.0–52.0)
Hemoglobin: 8.3 g/dL — ABNORMAL LOW (ref 13.0–17.0)
Lymphocytes Relative: 13 % (ref 12–46)
Lymphs Abs: 1.5 10*3/uL (ref 0.7–4.0)
MCH: 29.4 pg (ref 26.0–34.0)
MCHC: 33.7 g/dL (ref 30.0–36.0)
MCV: 87.2 fL (ref 78.0–100.0)
Monocytes Absolute: 0.5 10*3/uL (ref 0.1–1.0)
Monocytes Relative: 5 % (ref 3–12)
NEUTROS ABS: 9 10*3/uL — AB (ref 1.7–7.7)
NEUTROS PCT: 81 % — AB (ref 43–77)
Platelets: 240 10*3/uL (ref 150–400)
RBC: 2.82 MIL/uL — AB (ref 4.22–5.81)
RDW: 13.1 % (ref 11.5–15.5)
WBC: 11.2 10*3/uL — ABNORMAL HIGH (ref 4.0–10.5)

## 2013-09-13 LAB — BASIC METABOLIC PANEL
BUN: 10 mg/dL (ref 6–23)
CHLORIDE: 100 meq/L (ref 96–112)
CO2: 26 mEq/L (ref 19–32)
Calcium: 8.2 mg/dL — ABNORMAL LOW (ref 8.4–10.5)
Creatinine, Ser: 1.04 mg/dL (ref 0.50–1.35)
GFR calc non Af Amer: 79 mL/min — ABNORMAL LOW (ref >90)
Glucose, Bld: 225 mg/dL — ABNORMAL HIGH (ref 70–99)
POTASSIUM: 3.8 meq/L (ref 3.7–5.3)
Sodium: 136 mEq/L — ABNORMAL LOW (ref 137–147)

## 2013-09-13 LAB — GLUCOSE, CAPILLARY
GLUCOSE-CAPILLARY: 182 mg/dL — AB (ref 70–99)
Glucose-Capillary: 214 mg/dL — ABNORMAL HIGH (ref 70–99)
Glucose-Capillary: 240 mg/dL — ABNORMAL HIGH (ref 70–99)
Glucose-Capillary: 242 mg/dL — ABNORMAL HIGH (ref 70–99)

## 2013-09-13 MED ORDER — ASPIRIN EC 325 MG PO TBEC
325.0000 mg | DELAYED_RELEASE_TABLET | Freq: Every day | ORAL | Status: DC
  Administered 2013-09-14 – 2013-10-22 (×39): 325 mg via ORAL
  Filled 2013-09-13 (×41): qty 1

## 2013-09-13 MED ORDER — ENOXAPARIN SODIUM 40 MG/0.4ML ~~LOC~~ SOLN
40.0000 mg | SUBCUTANEOUS | Status: DC
  Administered 2013-09-13 – 2013-10-21 (×39): 40 mg via SUBCUTANEOUS
  Filled 2013-09-13 (×40): qty 0.4

## 2013-09-13 MED ORDER — INSULIN GLARGINE 100 UNIT/ML ~~LOC~~ SOLN
20.0000 [IU] | Freq: Every day | SUBCUTANEOUS | Status: DC
  Filled 2013-09-13: qty 0.2

## 2013-09-13 MED ORDER — ALUM & MAG HYDROXIDE-SIMETH 200-200-20 MG/5ML PO SUSP: 30.0000 mL | ORAL | Status: DC | PRN

## 2013-09-13 MED ORDER — INSULIN ASPART PROT & ASPART (70-30 MIX) 100 UNIT/ML ~~LOC~~ SUSP
10.0000 [IU] | Freq: Two times a day (BID) | SUBCUTANEOUS | Status: DC
  Administered 2013-09-13 – 2013-09-15 (×4): 10 [IU] via SUBCUTANEOUS
  Filled 2013-09-13: qty 10

## 2013-09-13 MED ORDER — INSULIN ASPART 100 UNIT/ML ~~LOC~~ SOLN
0.0000 [IU] | Freq: Three times a day (TID) | SUBCUTANEOUS | Status: DC
  Administered 2013-09-13 – 2013-09-14 (×3): 7 [IU] via SUBCUTANEOUS
  Administered 2013-09-14: 4 [IU] via SUBCUTANEOUS
  Administered 2013-09-15 (×2): 7 [IU] via SUBCUTANEOUS
  Administered 2013-09-15: 11 [IU] via SUBCUTANEOUS
  Administered 2013-09-16: 7 [IU] via SUBCUTANEOUS
  Administered 2013-09-16: 4 [IU] via SUBCUTANEOUS
  Administered 2013-09-16: 11 [IU] via SUBCUTANEOUS
  Administered 2013-09-17 – 2013-09-18 (×4): 4 [IU] via SUBCUTANEOUS
  Administered 2013-09-18: 3 [IU] via SUBCUTANEOUS
  Administered 2013-09-18: 4 [IU] via SUBCUTANEOUS
  Administered 2013-09-19: 7 [IU] via SUBCUTANEOUS
  Administered 2013-09-19 – 2013-09-20 (×2): 4 [IU] via SUBCUTANEOUS
  Administered 2013-09-20: 3 [IU] via SUBCUTANEOUS
  Administered 2013-09-20: 4 [IU] via SUBCUTANEOUS
  Administered 2013-09-21: 3 [IU] via SUBCUTANEOUS
  Administered 2013-09-21 – 2013-09-22 (×4): 4 [IU] via SUBCUTANEOUS
  Administered 2013-09-23 – 2013-09-25 (×5): 3 [IU] via SUBCUTANEOUS
  Administered 2013-09-26: 4 [IU] via SUBCUTANEOUS
  Administered 2013-09-26 – 2013-09-27 (×3): 3 [IU] via SUBCUTANEOUS
  Administered 2013-09-28: 4 [IU] via SUBCUTANEOUS
  Administered 2013-09-28: 3 [IU] via SUBCUTANEOUS
  Administered 2013-09-29: 4 [IU] via SUBCUTANEOUS
  Administered 2013-09-29: 3 [IU] via SUBCUTANEOUS
  Administered 2013-09-30: 4 [IU] via SUBCUTANEOUS
  Administered 2013-09-30 – 2013-10-01 (×5): 3 [IU] via SUBCUTANEOUS
  Administered 2013-10-02: 4 [IU] via SUBCUTANEOUS
  Administered 2013-10-02: 3 [IU] via SUBCUTANEOUS
  Administered 2013-10-03 – 2013-10-05 (×7): 4 [IU] via SUBCUTANEOUS
  Administered 2013-10-05: 7 [IU] via SUBCUTANEOUS
  Administered 2013-10-06 (×2): 4 [IU] via SUBCUTANEOUS
  Administered 2013-10-06: 7 [IU] via SUBCUTANEOUS
  Administered 2013-10-07: 3 [IU] via SUBCUTANEOUS
  Administered 2013-10-07: 4 [IU] via SUBCUTANEOUS
  Administered 2013-10-07: 3 [IU] via SUBCUTANEOUS
  Administered 2013-10-08: 4 [IU] via SUBCUTANEOUS
  Administered 2013-10-08: 7 [IU] via SUBCUTANEOUS
  Administered 2013-10-09: 3 [IU] via SUBCUTANEOUS
  Administered 2013-10-09: 4 [IU] via SUBCUTANEOUS
  Administered 2013-10-09 – 2013-10-11 (×4): 3 [IU] via SUBCUTANEOUS
  Administered 2013-10-11 – 2013-10-12 (×3): 4 [IU] via SUBCUTANEOUS
  Administered 2013-10-13 (×2): 3 [IU] via SUBCUTANEOUS
  Administered 2013-10-13: 7 [IU] via SUBCUTANEOUS
  Administered 2013-10-14: 3 [IU] via SUBCUTANEOUS
  Administered 2013-10-14: 4 [IU] via SUBCUTANEOUS
  Administered 2013-10-15 – 2013-10-16 (×4): 3 [IU] via SUBCUTANEOUS
  Administered 2013-10-17: 4 [IU] via SUBCUTANEOUS
  Administered 2013-10-17 – 2013-10-19 (×6): 3 [IU] via SUBCUTANEOUS
  Administered 2013-10-19: 4 [IU] via SUBCUTANEOUS
  Administered 2013-10-19 – 2013-10-20 (×4): 3 [IU] via SUBCUTANEOUS
  Administered 2013-10-21: 4 [IU] via SUBCUTANEOUS
  Administered 2013-10-21: 3 [IU] via SUBCUTANEOUS
  Administered 2013-10-21 – 2013-10-22 (×2): 4 [IU] via SUBCUTANEOUS
  Administered 2013-10-22: 3 [IU] via SUBCUTANEOUS

## 2013-09-13 MED ORDER — PROCHLORPERAZINE MALEATE 5 MG PO TABS
5.0000 mg | ORAL_TABLET | Freq: Four times a day (QID) | ORAL | Status: DC | PRN
  Filled 2013-09-13: qty 2

## 2013-09-13 MED ORDER — ACETAMINOPHEN 325 MG PO TABS
325.0000 mg | ORAL_TABLET | ORAL | Status: DC | PRN
  Administered 2013-09-15 – 2013-10-02 (×5): 650 mg via ORAL
  Filled 2013-09-13 (×5): qty 2

## 2013-09-13 MED ORDER — INSULIN ASPART 100 UNIT/ML ~~LOC~~ SOLN
0.0000 [IU] | Freq: Every day | SUBCUTANEOUS | Status: DC
  Administered 2013-09-14: 2 [IU] via SUBCUTANEOUS
  Administered 2013-09-15: 21:00:00 via SUBCUTANEOUS

## 2013-09-13 MED ORDER — FLEET ENEMA 7-19 GM/118ML RE ENEM: 1.0000 | ENEMA | Freq: Once | RECTAL | Status: AC | PRN

## 2013-09-13 MED ORDER — METHOCARBAMOL 500 MG PO TABS
500.0000 mg | ORAL_TABLET | Freq: Four times a day (QID) | ORAL | Status: DC | PRN
Start: 2013-09-13 — End: 2013-10-22
  Administered 2013-10-02 (×2): 500 mg via ORAL
  Filled 2013-09-13 (×3): qty 1

## 2013-09-13 MED ORDER — PROCHLORPERAZINE EDISYLATE 5 MG/ML IJ SOLN
5.0000 mg | Freq: Four times a day (QID) | INTRAMUSCULAR | Status: DC | PRN
  Administered 2013-09-19: 10 mg via INTRAMUSCULAR
  Filled 2013-09-13: qty 2

## 2013-09-13 MED ORDER — SODIUM CHLORIDE 0.9 % IV BOLUS (SEPSIS)
500.0000 mL | Freq: Once | INTRAVENOUS | Status: AC
  Administered 2013-09-13: 500 mL via INTRAVENOUS

## 2013-09-13 MED ORDER — SENNOSIDES-DOCUSATE SODIUM 8.6-50 MG PO TABS
1.0000 | ORAL_TABLET | Freq: Every evening | ORAL | Status: DC | PRN
  Administered 2013-10-10: 1 via ORAL
  Filled 2013-09-13 (×3): qty 1

## 2013-09-13 MED ORDER — BISACODYL 10 MG RE SUPP: 10.0000 mg | Freq: Every day | RECTAL | Status: DC | PRN

## 2013-09-13 MED ORDER — OXYCODONE HCL 5 MG PO TABS: 5.0000 mg | ORAL_TABLET | ORAL | Status: DC | PRN

## 2013-09-13 MED ORDER — PROCHLORPERAZINE 25 MG RE SUPP
12.5000 mg | Freq: Four times a day (QID) | RECTAL | Status: DC | PRN
  Filled 2013-09-13: qty 1

## 2013-09-13 MED ORDER — VANCOMYCIN HCL IN DEXTROSE 1-5 GM/200ML-% IV SOLN
1000.0000 mg | Freq: Two times a day (BID) | INTRAVENOUS | Status: DC
  Administered 2013-09-13 – 2013-09-16 (×5): 1000 mg via INTRAVENOUS
  Filled 2013-09-13 (×7): qty 200

## 2013-09-13 MED ORDER — GUAIFENESIN-DM 100-10 MG/5ML PO SYRP: 5.0000 mL | ORAL_SOLUTION | Freq: Four times a day (QID) | ORAL | Status: DC | PRN

## 2013-09-13 MED ORDER — DIPHENHYDRAMINE HCL 12.5 MG/5ML PO ELIX
12.5000 mg | ORAL_SOLUTION | Freq: Four times a day (QID) | ORAL | Status: DC | PRN
  Filled 2013-09-13: qty 10

## 2013-09-13 MED ORDER — GLUCERNA SHAKE PO LIQD
237.0000 mL | Freq: Two times a day (BID) | ORAL | Status: DC
  Administered 2013-09-14: 237 mL via ORAL

## 2013-09-13 MED ORDER — PIPERACILLIN-TAZOBACTAM 3.375 G IVPB
3.3750 g | Freq: Three times a day (TID) | INTRAVENOUS | Status: DC
  Administered 2013-09-13 – 2013-09-16 (×8): 3.375 g via INTRAVENOUS
  Filled 2013-09-13 (×10): qty 50

## 2013-09-13 MED ORDER — TRAZODONE HCL 50 MG PO TABS
25.0000 mg | ORAL_TABLET | Freq: Every evening | ORAL | Status: DC | PRN
  Administered 2013-10-02 – 2013-10-03 (×2): 50 mg via ORAL
  Filled 2013-09-13 (×2): qty 1

## 2013-09-13 NOTE — H&P (View-Only) (Signed)
Physical Medicine and Rehabilitation Admission H&P    Chief Complaint  Patient presents with  . Necrotizing fascitis s/p L-BKA and multiple necrotic toes on  right foot   HPI: [name] is a 58 y.o. male with history of diabetes mellitus, HTN, h/o hepatitis, BLE numbness since frostbite last year but no medical care. He has been living in his car for past 4-5 years (had declined opportunity to live with family) who was brought to ED on 09/01/13 by family due to inability to care for self, confusion as well as left foot infection. X rays with extensive soft tissue gas consistent with cellulitis and abscess formation. He was found to have diabetic ketoacidosis and treated with insulin infusion. He was evaluated by Dr. Erlinda Hong and placed on IV antibiotics.  His foot culture positive for proteus mirabilis--pan sensitive.  MRI with for concern for necrotizing fascitis and patient taken to OR for I & D with 4 th and 5th toe amputation with placement of VAC. Also noted to have necrotic 1st, 2nd and 5 th toes on the right and likey allow auto amputation per ortho. 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 Electrolyte abnormalities being monitored with concerns for refeeding syndrome. Mentation has improved and leucocytosis resolving. He was started on insulin for new diagnosis of diabetes--refusing diabetes education per nursing. Has been on IV clindamycin, zosyn and vancomycin  since admission and spiked temp of 102.4 last pm.  Therapies ongoing and patient making good progress. Rehab team recommended CIR and patient admitted today.      Review of Systems  HENT: Negative for hearing loss.   Eyes: Positive for blurred vision. Negative for double vision.  Respiratory: Negative for shortness of breath.   Cardiovascular: Negative for chest pain.  Gastrointestinal: Positive for constipation. Negative for heartburn and nausea.  Genitourinary: Negative for dysuria and  frequency.  Neurological: Positive for sensory change (phantom pain LLE) and weakness. Negative for dizziness and headaches.  Psychiatric/Behavioral: The patient is not nervous/anxious.    Past Medical History  Diagnosis Date  . Hepatitis   . Hypertension   . Pneumonia     HX OF PNA  . Diabetes mellitus without complication     PATIENT JUST LEARNED HE WAS DIABETIC  . Necrotizing fasciitis   . Hyponatremia   . Hypokalemia    Past Surgical History  Procedure Laterality Date  . Cholecystectomy    . I&d extremity Left 09/03/2013    Procedure: IRRIGATION AND DEBRIDEMENT EXTREMITY;  Surgeon: Marianna Payment, MD;  Location: [place];  Service: Orthopedics;  Laterality: Left;  . I&d extremity Left 09/11/2013    Procedure: LEFT FOOT IRRIGATION AND DEBRIDEMENT;  Surgeon: Marianna Payment, MD;  Location: [place];  Service: Orthopedics;  Laterality: Left;  . Amputation Left 09/11/2013    Procedure: AMPUTATION BELOW KNEE;  Surgeon: Marianna Payment, MD;  Location: [place];  Service: Orthopedics;  Laterality: Left;   Family History  Problem Relation Age of Onset  . Diabetes type II Mother   . Dementia Mother   . Heart disease Father    Social History:  Lives alone--usually eats a meal with family but lives in his car. 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 He reports that he has never smoked. He has never used smokeless tobacco. He reports that he does not drink alcohol or use illicit drugs.   Allergies: No Known Allergies  No prescriptions prior to  admission    Home: Home Living Family/patient expects to be discharged to:: Private residence Living Arrangements: Other (Comment) (Sister) Available Help at Discharge: Family;Available 24 hours/day Type of Home: House Home Access: Stairs to enter [employer] of Steps: 2-3 Entrance Stairs-Rails: None Home Layout: One level Home Equipment: None   Functional History:    Functional Status:  Mobility:       Ambulation/Gait Ambulation Distance (Feet): 12 Feet Gait velocity: Decreased General Gait Details: VC's to slow down and focus on technique. Pt was able to ambulate around the room well, however if going outside the room would have a chair follow.  Wheelchair Mobility Distance: 60  ADL: ADL Eating/Feeding: Supervision/safety Where Assessed - Eating/Feeding: Chair Grooming: Minimal assistance;Simulated;Set up;Supervision/safety;Min guard Where Assessed - Grooming: Supported sitting Upper Body Bathing: Minimal assistance Where Assessed - Upper Body Bathing: Supported sitting Lower Body Bathing: Moderate assistance Where Assessed - Lower Body Bathing: Supported sit to stand Upper Body Dressing: Minimal assistance Where Assessed - Upper Body Dressing: Supported sitting Lower Body Dressing: Moderate assistance Where Assessed - Lower Body Dressing: Supported sit to Lobbyist: Minimal Print production planner Method: Sit to stand (hop) Science writer:  (Bed>3 hops to recliner going to his right) Transfers/Ambulation Related to ADLs: +2 ADL Comments: decline in function  Cognition: Cognition Overall Cognitive Status: Within Functional Limits for tasks assessed Orientation Level: Oriented X4;Oriented to person;Oriented to place;Oriented to time;Oriented to situation Cognition Arousal/Alertness: Awake/alert Behavior During Therapy: WFL for tasks assessed/performed Overall Cognitive Status: Within Functional Limits for tasks assessed Awareness: Emergent Problem Solving: Slow processing General Comments: No impulsivity today, good awareness of his body position and maintaining L foot NWB throughout mobility.      Blood pressure 124/68, pulse 97, temperature 99.3 F (37.4 C), temperature source Oral, resp. rate 18, height $RemoveBe'5\' 3"'[id number]$  (1.6 m), weight 67.132 kg (148 lb), SpO2 94.00%. Physical Exam  Constitutional: He is oriented to person, place, and time. Disheveled  appearing HENT: dentition poor Head: Normocephalic.  Eyes: EOM are normal.  Neck: Normal range of motion. Neck supple. No thyromegaly present.  Cardiovascular: Normal rate and regular rhythm. No murmurs Respiratory: Effort normal and breath sounds normal. No respiratory distress.  GI: Soft. Bowel sounds are normal. He exhibits no distension.  Musculoskeletal:  Left coban dressing limiting ROM at knee, leg appropriately tender.  Shoulder , elbow, wrist And hand ROM normal  Neurological: He is alert and oriented to person, place, and time. A sensory deficit is present.   5/5 strength Bilateral delt, bi, tri, grip,  4/5 Bilateral HF and R KE  3- R ankle DF/PF 2-/5 Toe flexors and ext LLE can be listed against gravity Absent sensation LT toes of R foot  SKIN: necrotic digits Right foot: 1,2 and 5. Skin very dry otherwise      Results for orders placed during the hospital encounter of 09/01/13 (from the past 48 hour(s))  GLUCOSE, CAPILLARY     Status: Abnormal   Collection Time    09/11/13  6:25 PM      Result Value Ref Range   Glucose-Capillary 276 (*) 70 - 99 mg/dL  GLUCOSE, CAPILLARY     Status: Abnormal   Collection Time    09/11/13  9:23 PM      Result Value Ref Range   Glucose-Capillary 196 (*) 70 - 99 mg/dL  GLUCOSE, CAPILLARY     Status: Abnormal   Collection Time    09/12/13  5:13 AM  Result Value Ref Range   Glucose-Capillary 180 (*) 70 - 99 mg/dL  GLUCOSE, CAPILLARY     Status: Abnormal   Collection Time    09/12/13  6:40 AM      Result Value Ref Range   Glucose-Capillary 214 (*) 70 - 99 mg/dL  CBC     Status: Abnormal   Collection Time    09/12/13 10:26 AM      Result Value Ref Range   WBC 15.1 (*) 4.0 - 10.5 K/uL   RBC 2.89 (*) 4.22 - 5.81 MIL/uL   Hemoglobin 8.6 (*) 13.0 - 17.0 g/dL   HCT 25.2 (*) 39.0 - 52.0 %   MCV 87.2  78.0 - 100.0 fL   MCH 29.8  26.0 - 34.0 pg   MCHC 34.1  30.0 - 36.0 g/dL   RDW 13.1  11.5 - 15.5 %   Platelets 235  150 - 400  K/uL  GLUCOSE, CAPILLARY     Status: Abnormal   Collection Time    09/12/13 11:37 AM      Result Value Ref Range   Glucose-Capillary 259 (*) 70 - 99 mg/dL  GLUCOSE, CAPILLARY     Status: Abnormal   Collection Time    09/12/13  4:55 PM      Result Value Ref Range   Glucose-Capillary 267 (*) 70 - 99 mg/dL  GLUCOSE, CAPILLARY     Status: Abnormal   Collection Time    09/12/13 10:03 PM      Result Value Ref Range   Glucose-Capillary 255 (*) 70 - 99 mg/dL  GLUCOSE, CAPILLARY     Status: Abnormal   Collection Time    09/13/13  6:26 AM      Result Value Ref Range   Glucose-Capillary 214 (*) 70 - 99 mg/dL  BASIC METABOLIC PANEL     Status: Abnormal   Collection Time    09/13/13  7:26 AM      Result Value Ref Range   Sodium 136 (*) 137 - 147 mEq/L   Potassium 3.8  3.7 - 5.3 mEq/L   Chloride 100  96 - 112 mEq/L   CO2 26  19 - 32 mEq/L   Glucose, Bld 225 (*) 70 - 99 mg/dL   BUN 10  6 - 23 mg/dL   Creatinine, Ser 1.04  0.50 - 1.35 mg/dL   Calcium 8.2 (*) 8.4 - 10.5 mg/dL   GFR calc non Af Amer 79 (*) >90 mL/min   GFR calc Af Amer >90  >90 mL/min   Comment: (NOTE)     The eGFR has been calculated using the CKD EPI equation.     This calculation has not been validated in all clinical situations.     eGFR's persistently <90 mL/min signify possible Chronic Kidney     Disease.  CBC WITH DIFFERENTIAL     Status: Abnormal   Collection Time    09/13/13  7:26 AM      Result Value Ref Range   WBC 11.2 (*) 4.0 - 10.5 K/uL   RBC 2.82 (*) 4.22 - 5.81 MIL/uL   Hemoglobin 8.3 (*) 13.0 - 17.0 g/dL   HCT 24.6 (*) 39.0 - 52.0 %   MCV 87.2  78.0 - 100.0 fL   MCH 29.4  26.0 - 34.0 pg   MCHC 33.7  30.0 - 36.0 g/dL   RDW 13.1  11.5 - 15.5 %   Platelets 240  150 - 400 K/uL   Neutrophils Relative %  81 (*) 43 - 77 %   Neutro Abs 9.0 (*) 1.7 - 7.7 K/uL   Lymphocytes Relative 13  12 - 46 %   Lymphs Abs 1.5  0.7 - 4.0 K/uL   Monocytes Relative 5  3 - 12 %   Monocytes Absolute 0.5  0.1 - 1.0 K/uL    Eosinophils Relative 2  0 - 5 %   Eosinophils Absolute 0.2  0.0 - 0.7 K/uL   Basophils Relative 0  0 - 1 %   Basophils Absolute 0.0  0.0 - 0.1 K/uL  GLUCOSE, CAPILLARY     Status: Abnormal   Collection Time    09/13/13 11:44 AM      Result Value Ref Range   Glucose-Capillary 240 (*) 70 - 99 mg/dL   Comment 1 Documented in Chart     Comment 2 Notify RN     No results found.  Post Admission Physician Evaluation: 1. Functional deficits secondary  to left bka, necrotic digits right foot. 2. Patient is admitted to receive collaborative, interdisciplinary care between the physiatrist, rehab nursing staff, and therapy team. 3. Patient's level of medical complexity and substantial therapy needs in context of that medical necessity cannot be provided at a lesser intensity of care such as a SNF. 4. Patient has experienced substantial functional loss from his/her baseline which was documented above under the "Functional History" and "Functional Status" headings.  Judging by the patient's diagnosis, physical exam, and functional history, the patient has potential for functional progress which will result in measurable gains while on inpatient rehab.  These gains will be of substantial and practical use upon discharge  in facilitating mobility and self-care at the household level. 5. Physiatrist will provide 24 hour management of medical needs as well as oversight of the therapy plan/treatment and provide guidance as appropriate regarding the interaction of the two. 6. 24 hour rehab nursing will assist with bladder management, bowel management, safety, skin/wound care, disease management, medication administration, pain management and patient education  and help integrate therapy concepts, techniques,education, etc. 7. PT will assess and treat for/with: Lower extremity strength, range of motion, stamina, balance, functional mobility, safety, adaptive techniques and equipment, pre-pros ed, pain mgt.   Goals  are: mod I. 8. OT will assess and treat for/with: ADL's, functional mobility, safety, upper extremity strength, adaptive techniques and equipment, pain mgt.   Goals are: mod I. 9. SLP will assess and treat for/with: n/a.  Goals are: n/a. 10. Case Management and Social Worker will assess and treat for psychological issues and discharge planning. 11. Team conference will be held weekly to assess progress toward goals and to determine barriers to discharge. 12. Patient will receive at least 3 hours of therapy per day at least 5 days per week. 13. ELOS: 7-10 days       14. Prognosis:  excellent   Medical Problem List and Plan: Left BKA, and R necrotic toes  1. DVT Prophylaxis/Anticoagulation: Add SQ lovenox. Check RLE dopplers 2. Pain Management:  Prn oxcycodone.  3. Mood: Does not express any signs of distress or concerns. LCSW to follow for evaluation.  4. Neuropsych: This patient is capable of making decisions on his own behalf. 5. DM type 2 poorly controlled: Monitor BS with ac/hs checks. Will change lantus to 70/30 insulin as patient without insurance. Use SSI for elevated BS and titrate as indicated.  Question compliance past discharge.  6. Recurrent fever: Question drug related as has had low grade fever with spike  last pm. Will d/c cindamycin--qestion need for vancomycin. WBC is on downward trend. Pan culture for Temp > 101. Rule out DVT.   -continue to closely watch the right foot.   -offloaded darko shoe for right lower ext   Meredith Staggers, MD, [facility]   09/13/2013

## 2013-09-13 NOTE — Interval H&P Note (Signed)
[name] was admitted today to Inpatient Rehabilitation with the diagnosis of left BKA.  The patient's history has been reviewed, patient examined, and there is no change in status.  Patient continues to be appropriate for intensive inpatient rehabilitation.  I have reviewed the patient's chart and labs.  Questions were answered to the patient's satisfaction.  Malee Grays T 09/13/2013, 4:38 PM

## 2013-09-13 NOTE — Plan of Care (Signed)
Problem: RH KNOWLEDGE DEFICIT Goal: RH STG INCREASE KNOWLEDGE OF DIABETES Pt. Will increase knowledge on s/s of hypo and hyperglycemia and medications used in treatment, follow-up care with MD  Outcome: Progressing Patient reported viewing diabetes videos on previous unit of 5N.

## 2013-09-13 NOTE — Discharge Summary (Signed)
Physician Discharge Summary  [name] [id number] DOB: 25-Dec-1955 DOA: 09/01/2013  PCP: No PCP Per Patient  Admit date: 09/01/2013 Discharge date: 09/13/2013  Time spent: 45 minutes  Recommendations for Outpatient Follow-up:  -Will be going to CIR today.   Discharge Diagnoses:  Active Problems:   Abscess of foot   DM (diabetes mellitus) type II controlled, neurological manifestation   Cellulitis and abscess of foot   DKA (diabetic ketoacidoses)   Discharge Condition: Stable and improved  Filed Weights   09/01/13 2100 09/01/13 2209 09/02/13 0349  Weight: 65.318 kg (144 lb) 65.318 kg (144 lb) 67.132 kg (148 lb)    History of present illness:  58 y.o. male who has a past medical history of Hepatitis.  Presented with  Patient states that he had foot swelling since last week. Patient became incontinent of urine, feeling lightheaded, blurred vision, patient states that he started to loose balance as well. Denies any significant chest pain. Patient states that his feet are numb since last year. Patient has been wearing same socks and tenniss shoes for past 3 months at least. Patient was found to have ulcerations of left foot. Plain film was worrisome for presence of air. Orthopedics have been consulted and requests medical admission for antibiotics coverage, MRI in AM and will take to OR in AM.  Hospitalist called for admission.   Hospital Course:   Suspected necrotizing infection of the left foot  - Currently on vancomycin, Zosyn and clindamycin started on 2/2  - MRI left foot confirmed necrotizing infection ,BC X 2 from 2/3 are NG.  - At rest ABI was normal  - On 2/3 Underwent debridement, fifth ray amputation, fourth toe through metatarsal amputation and wound vac application  - Status post BKA 2/11, hemoglobin 8.6 today-follow  -WBC back up today- 15-is afebrile and hemodynamically, follow and recheck  - Continue pain management  - Appreciate Orthopedics assisatance   -Awaiting CIR eval   Black appearing toe-right foot, 1st, 2nd and 5th  - At rest ABI WNL  -Per ortho plan is to only continue antibiotics for now, I would follow closely in case needs further amputation.  Diabetic ketoacidosis  -Resolved. -sub-optimal blood glucose control, will increase Lantus to 20 units today follow and titrate as appropriate   Hypokalemia  - resolved, follow   Hypophosphatemia  - Normal at last check, resolved   PseudoHypocalcemia due to hypoalbuminemia  - Corrected Ca 9.2     Consultations:  Ortho  Discharge Instructions   Future Appointments Provider Department Dept Phone   09/16/2013 12:00 PM [facility] Provider [place] [phone]       Medication List    Notice   You have not been prescribed any medications.     No Known Allergies     Follow-up Information   Follow up with [facility]     On 09/16/2013. (@ 12:00pm. Please have d/c summary. )    Contact information:   [address] [place] [id number] [phone]       The results of significant diagnostics from this hospitalization (including imaging, microbiology, ancillary and laboratory) are listed below for reference.    Significant Diagnostic Studies: Mr Foot Left W Wo Contrast  09/03/2013   CLINICAL DATA:  Evaluate necrotizing fasciitis  EXAM: MRI OF THE LEFT FOREFOOT WITHOUT AND WITH CONTRAST  TECHNIQUE: Multiplanar, multisequence MR imaging was performed both before and after administration of intravenous contrast.  CONTRAST:  70mL MULTIHANCE GADOBENATE DIMEGLUMINE 529 MG/ML IV SOLN  COMPARISON:  None.  FINDINGS: There is a large amount of subcutaneous emphysema primarily along the lateral aspect of the foot involving the forefoot, midfoot and hindfoot extending into the ankle and outside the field of view. There is a large skin defect along the lateral aspect of the left forefoot. There is marrow  edema with enhancement of the shaft and head of the fifth metatarsal concerning for osteomyelitis.  There is no focal fluid collection. There is mild soft tissue edema along the dorsal lateral aspect of the foot extending into the ankle and proximally, but excluded from the field of view. There is a small amount fluid present within the extensor digitorum longus tendon sheath which may reflect tenosynovitis which is reactive versus infectious.  The remainder of the extensor, flexor and peroneal tendons are intact. The medial and lateral ankle ligaments are intact. The ankle mortise is normal. There is no focal chondral defect. There is no significant joint effusion. The Achilles tendon is intact. The plantar fascia is intact.  IMPRESSION: Large amount of subcutaneous emphysema primarily along the lateral aspect of the foot extending into the ankle and outside the field of view. There is a skin defect involving the lateral aspect of the left forefoot. The overall appearance is concerning for necrotizing fasciitis.  There is no drainable fluid collection. There is marrow edema with enhancement of the shaft and head of the fifth metatarsal concerning for osteomyelitis.  There is a small amount fluid present within the extensor digitorum longus tendon sheath which may reflect tenosynovitis which is reactive versus infectious.   Electronically Signed   By: Kathreen Devoid   On: 09/03/2013 08:25   Dg Foot Complete Left  09/01/2013   CLINICAL DATA:  Skin ulcerations both feet.  EXAM: LEFT FOOT - COMPLETE 3+ VIEW  COMPARISON:  None.  FINDINGS: Extensive gas is seen tracking in dorsal and plantar soft tissues of the left foot. Although diffuse, the largest collection of gas is seen about the third, fourth and fifth metatarsals. Soft tissues of the foot are swollen. No radiopaque foreign body is identified. No bony destructive change is seen. The patient appears to be status post amputation of the distal phalanx at the level  of the great toe. Well corticated defect in the head of the middle phalanx of the second toe is identified and may be postoperative.  IMPRESSION: Extensive soft tissue gas in the right foot is consistent with cellulitis and abscess formation. Necrotizing fasciitis can cause this appearance. No plain film evidence of osteomyelitis is identified.   Electronically Signed   By: Inge Rise M.D.   On: 09/01/2013 21:20   Dg Foot Complete Right  09/01/2013   ADDENDUM REPORT: 09/01/2013 21:57  ADDENDUM: This addendum is given for the purpose of noting that refreence to the right foot in the impression is incorrect. The report is for the left foot.   Electronically Signed   By: Inge Rise M.D.   On: 09/01/2013 21:57   09/01/2013   CLINICAL DATA:  Ulcers on both feet.  EXAM: RIGHT FOOT COMPLETE - 3+ VIEW  COMPARISON:  None.  FINDINGS: No bony destructive change, radiopaque foreign body or soft tissue gas collection is identified. There appear to be skin ulcerations in the lateral margin of the foot. There is no fracture or dislocation. Prominent calcaneal spurs are noted.  IMPRESSION: Skin ulcerations without acute bony or joint abnormality.  Electronically Signed: By: Marcello Moores  Dalessio M.D. On: 09/01/2013 21:15    Microbiology: Recent Results (from the past 240 hour(s))  CULTURE, BLOOD (ROUTINE X 2)     Status: None   Collection Time    09/03/13 10:00 PM      Result Value Ref Range Status   Specimen Description BLOOD RIGHT HAND   Final   Special Requests BOTTLES DRAWN AEROBIC ONLY 3CC   Final   Culture  Setup Time     Final   Value: 09/04/2013 03:30     Performed at [facility]   Culture     Final   Value: NO GROWTH 5 DAYS     Performed at [facility]   Report Status 09/10/2013 FINAL   Final  CULTURE, BLOOD (ROUTINE X 2)     Status: None   Collection Time    09/03/13 10:05 PM      Result Value Ref Range Status   Specimen Description BLOOD LEFT HAND   Final   Special  Requests BOTTLES DRAWN AEROBIC ONLY 4CC   Final   Culture  Setup Time     Final   Value: 09/04/2013 03:30     Performed at [facility]   Culture     Final   Value: NO GROWTH 5 DAYS     Performed at [facility]   Report Status 09/10/2013 FINAL   Final  SURGICAL PCR SCREEN     Status: None   Collection Time    09/11/13  9:38 AM      Result Value Ref Range Status   MRSA, PCR NEGATIVE  NEGATIVE Final   Staphylococcus aureus NEGATIVE  NEGATIVE Final   Comment:            The Xpert SA Assay (FDA     approved for NASAL specimens     in patients over 47 years of age),     is one component of     a comprehensive surveillance     program.  Test performance has     been validated by [employer] for patients greater     than or equal to 30 year old.     It is not intended     to diagnose infection nor to     guide or monitor treatment.     Labs: Basic Metabolic Panel:  Recent Labs Lab 09/08/13 0425 09/09/13 0700 09/10/13 0445 09/11/13 0348 09/13/13 0726  NA 141 142 144 140 136*  K 3.9 3.8 3.9 4.2 3.8  CL 102 103 103 99 100  CO2 26 26 24 27 26   GLUCOSE 243* 217* 166* 269* 225*  BUN 13 13 13 12 10   CREATININE 1.00 0.98 0.97 1.06 1.04  CALCIUM 7.9* 8.1* 8.6 8.6 8.2*   Liver Function Tests: No results found for this basename: AST, ALT, ALKPHOS, BILITOT, PROT, ALBUMIN,  in the last 168 hours No results found for this basename: LIPASE, AMYLASE,  in the last 168 hours No results found for this basename: AMMONIA,  in the last 168 hours CBC:  Recent Labs Lab 09/09/13 0700 09/10/13 0445 09/11/13 0348 09/12/13 1026 09/13/13 0726  WBC 12.0* 12.7* 10.5 15.1* 11.2*  NEUTROABS  --   --   --   --  9.0*  HGB 9.6* 9.7* 9.5* 8.6* 8.3*  HCT 27.2* 28.1* 27.9* 25.2* 24.6*  MCV 86.3 86.5 88.0 87.2 87.2  PLT 220 234 249 235 240   Cardiac Enzymes: No  results found for this basename: CKTOTAL, CKMB, CKMBINDEX, TROPONINI,  in the last 168 hours BNP: BNP (last 3  results) No results found for this basename: PROBNP,  in the last 8760 hours CBG:  Recent Labs Lab 09/12/13 1137 09/12/13 1655 09/12/13 2203 09/13/13 0626 09/13/13 1144  GLUCAP 259* 267* 255* 214* 240*       Signed:  HERNANDEZ ACOSTA,ESTELA  [facility] Pager: [phone] 09/13/2013, 2:39 PM

## 2013-09-13 NOTE — Progress Notes (Signed)
Rehab admissions - Evaluated for possible admission.  I met with patient and gave him rehab booklets.  He would like to come to inpatient rehab.  He is open to going to sister's home or talking to brother about home with him.  Patient says he is taking one day at a time.  Sister his offered her home.  Bed available on rehab and can admit to acute inpatient rehab today.  Call me for questions.  #[phone]

## 2013-09-13 NOTE — PMR Pre-admission (Signed)
PMR Admission Coordinator Pre-Admission Assessment  Patient: [name] is an 58 y.o., male MRN: [id number] DOB: 1955-12-28 Height: 5\' 3"  (160 cm) Weight: 67.132 kg (148 lb)              Insurance Information No insurance.  Medicaid Application Date:        Case Manager:   Disability Application Date:        Case Worker:    Emergency Contact Information Contact Information   Name Relation Home Work Mobile   Burkemper,Doris Sister [phone]       Current Medical History  Patient Admitting Diagnosis:  L BKA, necrotic toes R foot  History of Present Illness: A 58 y.o. male with history of diabetes mellitus, HTN, hepatitis, BLE numbness since frostbite last year but no medical care. He has been living in his car for past three years (had declined opportunity to live with family) who was brought to ED on 09/01/13 by family due to inability to care for self, confusion as well as left foot infection. X rays with extensive soft tissue gas consistent with cellulitis and abscess formation.Marland Kitchen He was found to have diabetic ketoacidosis and treated with insulin infusion. He was evaluated by Dr. Erlinda Hong and placed on IV antibiotics. MRI with for concern for necrotizing fascitis and patient taken to OR for I & D with 4 th and 5th toe amputation with placement of VAC. Also noted to have necrotic 1st, 2nd and 5 th toes on the right and likey allow auto amputation per ortho. 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 Electrolyte abnormalities being monitored with concerns for refeeding syndrome. Mentation has improved and leucocytosis resolving. Hemoglobin A1c 12.2 with insulin therapy as directed. Await followup evaluations per physical and occupational therapy. M.D. as requested physical medicine rehabilitation consult to consider inpatient rehabilitation services.  Pt visiting with sister and niece in room, sister states he can live at her house but cannot provide 24/7 sup.  Can do meals.     Past Medical History  Past Medical History  Diagnosis Date  . Hepatitis   . Hypertension   . Pneumonia     HX OF PNA  . Diabetes mellitus without complication     PATIENT JUST LEARNED HE WAS DIABETIC  . Necrotizing fasciitis   . Hyponatremia   . Hypokalemia     Family History  family history includes Diabetes type II in his mother.  Prior Rehab/Hospitalizations:  None   Current Medications  Current facility-administered medications:acetaminophen (TYLENOL) tablet 650 mg, 650 mg, Oral, Q4H PRN, Rhetta Mura Schorr, NP, 650 mg at 09/12/13 2230;  aspirin EC tablet 325 mg, 325 mg, Oral, Daily, Naiping Eduard Roux, MD, 325 mg at 09/13/13 1009;  clindamycin (CLEOCIN) IVPB 600 mg, 600 mg, Intravenous, 3 times per day, Jonetta Osgood, MD, 600 mg at 09/13/13 0656;  dextrose 50 % solution 25 mL, 25 mL, Intravenous, PRN, Larene Pickett, PA-C diphenhydrAMINE (BENADRYL) 12.5 MG/5ML elixir 25 mg, 25 mg, Oral, Q4H PRN, Naiping Eduard Roux, MD;  feeding supplement (GLUCERNA SHAKE) (GLUCERNA SHAKE) liquid 237 mL, 237 mL, Oral, BID BM, Dalene Carrow, RD, 237 mL at 09/13/13 1000;  HYDROcodone-acetaminophen (NORCO/VICODIN) 5-325 MG per tablet 1-2 tablet, 1-2 tablet, Oral, Q4H PRN, Naiping Eduard Roux, MD insulin aspart (novoLOG) injection 0-20 Units, 0-20 Units, Subcutaneous, TID WC, Annita Brod, MD, 7 Units at 09/13/13 0654;  insulin aspart (novoLOG) injection 0-5 Units, 0-5 Units, Subcutaneous, QHS, Annita Brod, MD, 3  Units at 09/12/13 2237;  insulin glargine (LANTUS) injection 18 Units, 18 Units, Subcutaneous, QHS, Sheila Oats, MD, 18 Units at 09/12/13 2236 methocarbamol (ROBAXIN) 500 mg in dextrose 5 % 50 mL IVPB, 500 mg, Intravenous, Q6H PRN, Naiping Eduard Roux, MD;  methocarbamol (ROBAXIN) tablet 500 mg, 500 mg, Oral, Q6H PRN, Naiping Eduard Roux, MD;  morphine 2 MG/ML injection 1 mg, 1 mg, Intravenous, Q2H PRN, Naiping Eduard Roux, MD;  morphine 2 MG/ML  injection 2 mg, 2 mg, Intravenous, Q4H PRN, Toy Baker, MD, 2 mg at 09/04/13 1350 ondansetron (ZOFRAN) injection 4 mg, 4 mg, Intravenous, Q6H PRN, Toy Baker, MD;  ondansetron (ZOFRAN) tablet 4 mg, 4 mg, Oral, Q6H PRN, Toy Baker, MD;  oxyCODONE (Oxy IR/ROXICODONE) immediate release tablet 5-10 mg, 5-10 mg, Oral, Q3H PRN, Marianna Payment, MD, 10 mg at 09/12/13 0253;  piperacillin-tazobactam (ZOSYN) IVPB 3.375 g, 3.375 g, Intravenous, Q8H, Toy Baker, MD, 3.375 g at 09/13/13 0656 sodium chloride 0.9 % injection 3 mL, 3 mL, Intravenous, Q12H, Toy Baker, MD, 3 mL at 09/12/13 1000;  vancomycin (VANCOCIN) IVPB 1000 mg/200 mL premix, 1,000 mg, Intravenous, Q12H, Patsey Berthold [place], RPH, 1,000 mg at 09/13/13 1009  Patients Current Diet: Carb Control  Precautions / Restrictions Precautions Precautions: Fall Precaution Comments: wound VAC Restrictions Weight Bearing Restrictions: Yes LLE Weight Bearing: Non weight bearing   Prior Activity Level Community (5-7x/wk): Went out daily to fast [employer].  Home Assistive Devices / Equipment Home Assistive Devices/Equipment: None Home Equipment: None  Prior Functional Level Prior Function Level of Independence: Independent  Current Functional Level Cognition  Overall Cognitive Status: Within Functional Limits for tasks assessed Orientation Level: Oriented X4;Oriented to person;Oriented to place;Oriented to time;Oriented to situation General Comments: No impulsivity today, good awareness of his body position and maintaining L foot NWB throughout mobility.     Extremity Assessment (includes Sensation/Coordination)          ADLs  Eating/Feeding: Supervision/safety Where Assessed - Eating/Feeding: Chair Grooming: Minimal assistance;Simulated;Set up;Supervision/safety;Min guard Where Assessed - Grooming: Supported sitting Upper Body Bathing: Minimal assistance Where Assessed -  Upper Body Bathing: Supported sitting Lower Body Bathing: Moderate assistance Where Assessed - Lower Body Bathing: Supported sit to stand Upper Body Dressing: Minimal assistance Where Assessed - Upper Body Dressing: Supported sitting Lower Body Dressing: Moderate assistance Where Assessed - Lower Body Dressing: Supported sit to Lobbyist: Minimal assistance Toilet Transfer Method: Sit to stand (hop) Science writer:  (Bed>3 hops to recliner going to his right) Toileting - Water quality scientist and Hygiene: Moderate assistance Where Assessed - Toileting Clothing Manipulation and Hygiene: Lean right and/or left Transfers/Ambulation Related to ADLs: +2 ADL Comments: decline in function    Mobility  Overal bed mobility: Modified Independent Bed Mobility: Sit to Supine Supine to sit: Modified independent (Device/Increase time) General bed mobility comments: Use of bedrails for support. No physical assist required.     Transfers  Overall transfer level: Needs assistance Equipment used: Rolling walker (2 wheeled) Transfers: Sit to/from Stand Sit to Stand: Min guard Stand pivot transfers: Min assist Squat pivot transfers: Min guard General transfer comment: Pt demonstrated proper hand placement and safety awareness.    Ambulation / Gait / Stairs / Wheelchair Mobility  Ambulation/Gait Ambulation Distance (Feet): 12 Feet Gait velocity: Decreased General Gait Details: VC's to slow down and focus on technique. Pt was able to ambulate around the room well, however if going outside the room would have a chair follow.  Wheelchair Mobility  Distance: 60    Posture / Balance      Special needs/care consideration BiPAP/CPAP No CPM No Continuous Drip IV No Dialysis No         Life Vest No Oxygen No Special Bed No Trach Size No Wound Vac (area) No   Skin New L BKA incision.  Has Necrotic toes Right foot, big toe, 1st and 2nd toes.            Bowel mgmt:  Last  documented BM 09/11/13.   Bladder mgmt: Voiding in urinal Diabetic mgmt Yes, new diabetic, now on insulin    Previous Home Environment Living Arrangements: Other (Comment) (Sister) Available Help at Discharge: Family;Available 24 hours/day Type of Home: House Home Layout: One level Home Access: Stairs to enter Entrance Stairs-Rails: None Entrance Stairs-Number of Steps: 2-3 Bathroom Shower/Tub: Optometrist: Yes Home Care Services: No  Discharge Living Setting Plans for Discharge Living Setting: House;Lives with (comment) (Can go home with his sister.  Brother lives next door to sis) Type of Home at Discharge: House Discharge Home Layout: Two level;Able to live on main level with bedroom/bathroom (Has no shower in the house.  Goes to brother to shower.) Alternate Level Stairs-Number of Steps: Flight (Upstairs is used for storage mostly.) Discharge Home Access: Stairs to enter Technical brewer of Steps: 2 Discharge Bathroom Shower/Tub: None (No shower is sister's home.) Does the patient have any problems obtaining your medications?: No  Social/Family/Support Systems Patient Roles: Other (Comment) (Has a sister and a brother.) Contact Information: Desmund Kuper - sister [phone] Anticipated Caregiver: self and sister Ability/Limitations of Caregiver: Sister works at [employer], sometimes early mornings and sometimes late evenings. Caregiver Availability: Intermittent Discharge Plan Discussed with Primary Caregiver: Yes Is Caregiver In Agreement with Plan?: Yes Does Caregiver/Family have Issues with Lodging/Transportation while Pt is in Rehab?: Yes (Patient did not have a home PTA.  Lived in his car.)  Goals/Additional Needs Patient/Family Goal for Rehab: PT mod I, OT mod I/Supervision, no ST needs Expected length of stay: 7 days Cultural Considerations: None Dietary Needs: Carb mod med calorie, thin  liquids Equipment Needs: TBD Special Service Needs: Is a new diabetic.  Will need assistance with medications. Says he has small income from [employer]. Additional Information: PTA lived out of his car.  Slept in car at night. Pt/Family Agrees to Admission and willing to participate: Yes Program Orientation Provided & Reviewed with Pt/Caregiver Including Roles  & Responsibilities: Yes   Decrease burden of Care through IP rehab admission: N/A   Possible need for SNF placement upon discharge: Not planned   Patient Condition: This patient's condition remains as documented in the consult dated 09/12/13, in which the Rehabilitation Physician determined and documented that the patient's condition is appropriate for intensive rehabilitative care in an inpatient rehabilitation facility. Will admit to inpatient rehab today.  Preadmission Screen Completed By:  Retta Diones, 09/13/2013 11:30 AM ______________________________________________________________________   Discussed status with Dr. Naaman Plummer on 09/13/13 at 1138 and received telephone approval for admission today.  Admission Coordinator:  Retta Diones, time1138/Date02/13/15

## 2013-09-13 NOTE — Progress Notes (Signed)
Patient arrived from 5N with RN and belongings. Arrived via bed. Patient oriented to room, call bell, rehab process and fall prevention plan. No c/o of pain at this time. Will continue to monitor. Bed alarm on with side rails x3 for safety.

## 2013-09-13 NOTE — Progress Notes (Signed)
Temp of 102.4 at 2200.  Tylenol 650mg  given.  Room temperature adjusted.  On-call hospitalist provider notified and new orders for NS bolus and CBC with diff in am.  Temp 101.4 at 2330.  Continue to monitor.    Patient refusing to attempt self administration of insulin or fingersticks.  States does "not feel up to it."  Continue to encourage participation in diabetes management.

## 2013-09-13 NOTE — Progress Notes (Signed)
PT Cancellation Note  Patient Details Name: [name] MRN: [id number] DOB: [birth year]   Cancelled Treatment:    Reason Eval/Treat Not Completed: Per chart review and conversation with RN, pt to transfer to CIR this afternoon. Will defer treatment.   Jolyn Lent 09/13/2013, 2:19 PM  Jolyn Lent, [place], DPT [phone]

## 2013-09-13 NOTE — Progress Notes (Signed)
ANTIBIOTIC CONSULT NOTE - FOLLOW UP  Pharmacy Consult for vancomycin  Indication: Suspected necrotizing infection of left foot  No Known Allergies  Patient Measurements: Height: 5\' 3"  (160 cm) IBW/kg (Calculated) : 56.9  Vital Signs: Temp: 98.3 F (36.8 C) (02/13 1654) Temp src: Oral (02/13 1654) BP: 144/78 mmHg (02/13 1654) Pulse Rate: 95 (02/13 1654) Intake/Output from previous day:   Intake/Output from this shift:    Labs:  Recent Labs  09/11/13 0348 09/12/13 1026 09/13/13 0726  WBC 10.5 15.1* 11.2*  HGB 9.5* 8.6* 8.3*  PLT 249 235 240  CREATININE 1.06  --  1.04   The CrCl is unknown because both a height and weight (above a minimum accepted value) are required for this calculation. No results found for this basename: Letta Median, VANCORANDOM, GENTTROUGH, GENTPEAK, GENTRANDOM, TOBRATROUGH, TOBRAPEAK, TOBRARND, AMIKACINPEAK, AMIKACINTROU, AMIKACIN,  in the last 72 hours    Assessment: 58 year old male with necrotizing foot infection, admitted 09/13/2013 to CIR s/p  BKA   PMH: DM, hepatitis  ID:: Foot abscess now s/p BKA Tmax 101.4, wbc 11.2 (down)> up day after amputation. SCr stable.   Vancomycin trough on 2/6 was therapeutic at 16 on Vanc 1 gm IV Q 12 h.  No dose adjustments warranted for zosyn or clinda. What is plan for length of therapy?  2/2 Vanc>> 2/2 Zosyn>> 2/2 Clinda>>  2/1 wound cx - few proteus- pan sens, moderate coag negative staph 2/3 Abscess L foot>> proteus - pan sens 2/3 BC X 2>>neg  Goal of Therapy:  Vancomycin 15-20  Plan:  1. Continue Vancomycin 1g IV q12h, follow trough q7d 2. Continue Clinda 600mg  IV q8h 3. Zosyn 3.375 gm IV Q 8 hours  4. Monitor renal function, cultures and clinical course 5. What is plan for length of therapy?   Thank you for allowing pharmacy to be a part of this patients care team.  Rowe Robert Pharm.D., BCPS Clinical Pharmacist 09/13/2013 5:06 PM Pager: [phone] Phone: [phone]

## 2013-09-13 NOTE — Care Management Note (Signed)
09/13/13  Alex BSN Patient will discharge to CIR today. CM received confirmation from [facility]. CM signing off.

## 2013-09-13 NOTE — Progress Notes (Signed)
Pt transported VIA bed by RN and NT to room 3m04 CIR. RN gave report to admitting nurse, Erline Levine, RN on floor. Pt alert and oriented upon transfer with no complaints of pain or SOB. All of patients belongings were sent down in patient belonging bags.

## 2013-09-13 NOTE — H&P (Signed)
Physical Medicine and Rehabilitation Admission H&P    Chief Complaint  Patient presents with  . Necrotizing fascitis s/p L-BKA and multiple necrotic toes on  right foot   HPI: [name] is a 58 y.o. male with history of diabetes mellitus, HTN, h/o hepatitis, BLE numbness since frostbite last year but no medical care. He has been living in his car for past 4-5 years (had declined opportunity to live with family) who was brought to ED on 09/01/13 by family due to inability to care for self, confusion as well as left foot infection. X rays with extensive soft tissue gas consistent with cellulitis and abscess formation. He was found to have diabetic ketoacidosis and treated with insulin infusion. He was evaluated by Dr. Erlinda Hong and placed on IV antibiotics.  His foot culture positive for proteus mirabilis--pan sensitive.  MRI with for concern for necrotizing fascitis and patient taken to OR for I & D with 4 th and 5th toe amputation with placement of VAC. Also noted to have necrotic 1st, 2nd and 5 th toes on the right and likey allow auto amputation per ortho. 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 Electrolyte abnormalities being monitored with concerns for refeeding syndrome. Mentation has improved and leucocytosis resolving. He was started on insulin for new diagnosis of diabetes--refusing diabetes education per nursing. Has been on IV clindamycin, zosyn and vancomycin  since admission and spiked temp of 102.4 last pm.  Therapies ongoing and patient making good progress. Rehab team recommended CIR and patient admitted today.      Review of Systems  HENT: Negative for hearing loss.   Eyes: Positive for blurred vision. Negative for double vision.  Respiratory: Negative for shortness of breath.   Cardiovascular: Negative for chest pain.  Gastrointestinal: Positive for constipation. Negative for heartburn and nausea.  Genitourinary: Negative for dysuria and  frequency.  Neurological: Positive for sensory change (phantom pain LLE) and weakness. Negative for dizziness and headaches.  Psychiatric/Behavioral: The patient is not nervous/anxious.    Past Medical History  Diagnosis Date  . Hepatitis   . Hypertension   . Pneumonia     HX OF PNA  . Diabetes mellitus without complication     PATIENT JUST LEARNED HE WAS DIABETIC  . Necrotizing fasciitis   . Hyponatremia   . Hypokalemia    Past Surgical History  Procedure Laterality Date  . Cholecystectomy    . I&d extremity Left 09/03/2013    Procedure: IRRIGATION AND DEBRIDEMENT EXTREMITY;  Surgeon: Marianna Payment, MD;  Location: [place];  Service: Orthopedics;  Laterality: Left;  . I&d extremity Left 09/11/2013    Procedure: LEFT FOOT IRRIGATION AND DEBRIDEMENT;  Surgeon: Marianna Payment, MD;  Location: [place];  Service: Orthopedics;  Laterality: Left;  . Amputation Left 09/11/2013    Procedure: AMPUTATION BELOW KNEE;  Surgeon: Marianna Payment, MD;  Location: [place];  Service: Orthopedics;  Laterality: Left;   Family History  Problem Relation Age of Onset  . Diabetes type II Mother   . Dementia Mother   . Heart disease Father    Social History:  Lives alone--usually eats a meal with family but lives in his car. 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 He reports that he has never smoked. He has never used smokeless tobacco. He reports that he does not drink alcohol or use illicit drugs.   Allergies: No Known Allergies  No prescriptions prior to  admission    Home: Home Living Family/patient expects to be discharged to:: Private residence Living Arrangements: Other (Comment) (Sister) Available Help at Discharge: Family;Available 24 hours/day Type of Home: House Home Access: Stairs to enter [employer] of Steps: 2-3 Entrance Stairs-Rails: None Home Layout: One level Home Equipment: None   Functional History:    Functional Status:  Mobility:       Ambulation/Gait Ambulation Distance (Feet): 12 Feet Gait velocity: Decreased General Gait Details: VC's to slow down and focus on technique. Pt was able to ambulate around the room well, however if going outside the room would have a chair follow.  Wheelchair Mobility Distance: 60  ADL: ADL Eating/Feeding: Supervision/safety Where Assessed - Eating/Feeding: Chair Grooming: Minimal assistance;Simulated;Set up;Supervision/safety;Min guard Where Assessed - Grooming: Supported sitting Upper Body Bathing: Minimal assistance Where Assessed - Upper Body Bathing: Supported sitting Lower Body Bathing: Moderate assistance Where Assessed - Lower Body Bathing: Supported sit to stand Upper Body Dressing: Minimal assistance Where Assessed - Upper Body Dressing: Supported sitting Lower Body Dressing: Moderate assistance Where Assessed - Lower Body Dressing: Supported sit to Lobbyist: Minimal Print production planner Method: Sit to stand (hop) Science writer:  (Bed>3 hops to recliner going to his right) Transfers/Ambulation Related to ADLs: +2 ADL Comments: decline in function  Cognition: Cognition Overall Cognitive Status: Within Functional Limits for tasks assessed Orientation Level: Oriented X4;Oriented to person;Oriented to place;Oriented to time;Oriented to situation Cognition Arousal/Alertness: Awake/alert Behavior During Therapy: WFL for tasks assessed/performed Overall Cognitive Status: Within Functional Limits for tasks assessed Awareness: Emergent Problem Solving: Slow processing General Comments: No impulsivity today, good awareness of his body position and maintaining L foot NWB throughout mobility.      Blood pressure 124/68, pulse 97, temperature 99.3 F (37.4 C), temperature source Oral, resp. rate 18, height $RemoveBe'5\' 3"'[id number]$  (1.6 m), weight 67.132 kg (148 lb), SpO2 94.00%. Physical Exam  Constitutional: He is oriented to person, place, and time. Disheveled  appearing HENT: dentition poor Head: Normocephalic.  Eyes: EOM are normal.  Neck: Normal range of motion. Neck supple. No thyromegaly present.  Cardiovascular: Normal rate and regular rhythm. No murmurs Respiratory: Effort normal and breath sounds normal. No respiratory distress.  GI: Soft. Bowel sounds are normal. He exhibits no distension.  Musculoskeletal:  Left coban dressing limiting ROM at knee, leg appropriately tender.  Shoulder , elbow, wrist And hand ROM normal  Neurological: He is alert and oriented to person, place, and time. A sensory deficit is present.   5/5 strength Bilateral delt, bi, tri, grip,  4/5 Bilateral HF and R KE  3- R ankle DF/PF 2-/5 Toe flexors and ext LLE can be listed against gravity Absent sensation LT toes of R foot  SKIN: necrotic digits Right foot: 1,2 and 5. Skin very dry otherwise      Results for orders placed during the hospital encounter of 09/01/13 (from the past 48 hour(s))  GLUCOSE, CAPILLARY     Status: Abnormal   Collection Time    09/11/13  6:25 PM      Result Value Ref Range   Glucose-Capillary 276 (*) 70 - 99 mg/dL  GLUCOSE, CAPILLARY     Status: Abnormal   Collection Time    09/11/13  9:23 PM      Result Value Ref Range   Glucose-Capillary 196 (*) 70 - 99 mg/dL  GLUCOSE, CAPILLARY     Status: Abnormal   Collection Time    09/12/13  5:13 AM  Result Value Ref Range   Glucose-Capillary 180 (*) 70 - 99 mg/dL  GLUCOSE, CAPILLARY     Status: Abnormal   Collection Time    09/12/13  6:40 AM      Result Value Ref Range   Glucose-Capillary 214 (*) 70 - 99 mg/dL  CBC     Status: Abnormal   Collection Time    09/12/13 10:26 AM      Result Value Ref Range   WBC 15.1 (*) 4.0 - 10.5 K/uL   RBC 2.89 (*) 4.22 - 5.81 MIL/uL   Hemoglobin 8.6 (*) 13.0 - 17.0 g/dL   HCT 25.2 (*) 39.0 - 52.0 %   MCV 87.2  78.0 - 100.0 fL   MCH 29.8  26.0 - 34.0 pg   MCHC 34.1  30.0 - 36.0 g/dL   RDW 13.1  11.5 - 15.5 %   Platelets 235  150 - 400  K/uL  GLUCOSE, CAPILLARY     Status: Abnormal   Collection Time    09/12/13 11:37 AM      Result Value Ref Range   Glucose-Capillary 259 (*) 70 - 99 mg/dL  GLUCOSE, CAPILLARY     Status: Abnormal   Collection Time    09/12/13  4:55 PM      Result Value Ref Range   Glucose-Capillary 267 (*) 70 - 99 mg/dL  GLUCOSE, CAPILLARY     Status: Abnormal   Collection Time    09/12/13 10:03 PM      Result Value Ref Range   Glucose-Capillary 255 (*) 70 - 99 mg/dL  GLUCOSE, CAPILLARY     Status: Abnormal   Collection Time    09/13/13  6:26 AM      Result Value Ref Range   Glucose-Capillary 214 (*) 70 - 99 mg/dL  BASIC METABOLIC PANEL     Status: Abnormal   Collection Time    09/13/13  7:26 AM      Result Value Ref Range   Sodium 136 (*) 137 - 147 mEq/L   Potassium 3.8  3.7 - 5.3 mEq/L   Chloride 100  96 - 112 mEq/L   CO2 26  19 - 32 mEq/L   Glucose, Bld 225 (*) 70 - 99 mg/dL   BUN 10  6 - 23 mg/dL   Creatinine, Ser 1.04  0.50 - 1.35 mg/dL   Calcium 8.2 (*) 8.4 - 10.5 mg/dL   GFR calc non Af Amer 79 (*) >90 mL/min   GFR calc Af Amer >90  >90 mL/min   Comment: (NOTE)     The eGFR has been calculated using the CKD EPI equation.     This calculation has not been validated in all clinical situations.     eGFR's persistently <90 mL/min signify possible Chronic Kidney     Disease.  CBC WITH DIFFERENTIAL     Status: Abnormal   Collection Time    09/13/13  7:26 AM      Result Value Ref Range   WBC 11.2 (*) 4.0 - 10.5 K/uL   RBC 2.82 (*) 4.22 - 5.81 MIL/uL   Hemoglobin 8.3 (*) 13.0 - 17.0 g/dL   HCT 24.6 (*) 39.0 - 52.0 %   MCV 87.2  78.0 - 100.0 fL   MCH 29.4  26.0 - 34.0 pg   MCHC 33.7  30.0 - 36.0 g/dL   RDW 13.1  11.5 - 15.5 %   Platelets 240  150 - 400 K/uL   Neutrophils Relative %  81 (*) 43 - 77 %   Neutro Abs 9.0 (*) 1.7 - 7.7 K/uL   Lymphocytes Relative 13  12 - 46 %   Lymphs Abs 1.5  0.7 - 4.0 K/uL   Monocytes Relative 5  3 - 12 %   Monocytes Absolute 0.5  0.1 - 1.0 K/uL    Eosinophils Relative 2  0 - 5 %   Eosinophils Absolute 0.2  0.0 - 0.7 K/uL   Basophils Relative 0  0 - 1 %   Basophils Absolute 0.0  0.0 - 0.1 K/uL  GLUCOSE, CAPILLARY     Status: Abnormal   Collection Time    09/13/13 11:44 AM      Result Value Ref Range   Glucose-Capillary 240 (*) 70 - 99 mg/dL   Comment 1 Documented in Chart     Comment 2 Notify RN     No results found.  Post Admission Physician Evaluation: 1. Functional deficits secondary  to left bka, necrotic digits right foot. 2. Patient is admitted to receive collaborative, interdisciplinary care between the physiatrist, rehab nursing staff, and therapy team. 3. Patient's level of medical complexity and substantial therapy needs in context of that medical necessity cannot be provided at a lesser intensity of care such as a SNF. 4. Patient has experienced substantial functional loss from his/her baseline which was documented above under the "Functional History" and "Functional Status" headings.  Judging by the patient's diagnosis, physical exam, and functional history, the patient has potential for functional progress which will result in measurable gains while on inpatient rehab.  These gains will be of substantial and practical use upon discharge  in facilitating mobility and self-care at the household level. 5. Physiatrist will provide 24 hour management of medical needs as well as oversight of the therapy plan/treatment and provide guidance as appropriate regarding the interaction of the two. 6. 24 hour rehab nursing will assist with bladder management, bowel management, safety, skin/wound care, disease management, medication administration, pain management and patient education  and help integrate therapy concepts, techniques,education, etc. 7. PT will assess and treat for/with: Lower extremity strength, range of motion, stamina, balance, functional mobility, safety, adaptive techniques and equipment, pre-pros ed, pain mgt.   Goals  are: mod I. 8. OT will assess and treat for/with: ADL's, functional mobility, safety, upper extremity strength, adaptive techniques and equipment, pain mgt.   Goals are: mod I. 9. SLP will assess and treat for/with: n/a.  Goals are: n/a. 10. Case Management and Social Worker will assess and treat for psychological issues and discharge planning. 11. Team conference will be held weekly to assess progress toward goals and to determine barriers to discharge. 12. Patient will receive at least 3 hours of therapy per day at least 5 days per week. 13. ELOS: 7-10 days       14. Prognosis:  excellent   Medical Problem List and Plan: Left BKA, and R necrotic toes  1. DVT Prophylaxis/Anticoagulation: Add SQ lovenox. Check RLE dopplers 2. Pain Management:  Prn oxcycodone.  3. Mood: Does not express any signs of distress or concerns. LCSW to follow for evaluation.  4. Neuropsych: This patient is capable of making decisions on his own behalf. 5. DM type 2 poorly controlled: Monitor BS with ac/hs checks. Will change lantus to 70/30 insulin as patient without insurance. Use SSI for elevated BS and titrate as indicated.  Question compliance past discharge.  6. Recurrent fever: Question drug related as has had low grade fever with spike  last pm. Will d/c cindamycin--qestion need for vancomycin. WBC is on downward trend. Pan culture for Temp > 101. Rule out DVT.   -continue to closely watch the right foot.   -offloaded darko shoe for right lower ext   Meredith Staggers, MD, [facility]   09/13/2013

## 2013-09-14 ENCOUNTER — Inpatient Hospital Stay (HOSPITAL_COMMUNITY): Payer: Medicaid Other | Admitting: Physical Therapy

## 2013-09-14 ENCOUNTER — Inpatient Hospital Stay (HOSPITAL_COMMUNITY): Payer: Self-pay | Admitting: Occupational Therapy

## 2013-09-14 DIAGNOSIS — I739 Peripheral vascular disease, unspecified: Secondary | ICD-10-CM

## 2013-09-14 DIAGNOSIS — S88119A Complete traumatic amputation at level between knee and ankle, unspecified lower leg, initial encounter: Secondary | ICD-10-CM

## 2013-09-14 DIAGNOSIS — M726 Necrotizing fasciitis: Secondary | ICD-10-CM

## 2013-09-14 DIAGNOSIS — E1310 Other specified diabetes mellitus with ketoacidosis without coma: Secondary | ICD-10-CM

## 2013-09-14 DIAGNOSIS — L02619 Cutaneous abscess of unspecified foot: Secondary | ICD-10-CM

## 2013-09-14 DIAGNOSIS — L03119 Cellulitis of unspecified part of limb: Secondary | ICD-10-CM

## 2013-09-14 DIAGNOSIS — I96 Gangrene, not elsewhere classified: Secondary | ICD-10-CM

## 2013-09-14 DIAGNOSIS — L98499 Non-pressure chronic ulcer of skin of other sites with unspecified severity: Secondary | ICD-10-CM

## 2013-09-14 LAB — COMPREHENSIVE METABOLIC PANEL
ALT: 11 U/L (ref 0–53)
AST: 16 U/L (ref 0–37)
Albumin: 2 g/dL — ABNORMAL LOW (ref 3.5–5.2)
Alkaline Phosphatase: 74 U/L (ref 39–117)
BUN: 10 mg/dL (ref 6–23)
CALCIUM: 8.2 mg/dL — AB (ref 8.4–10.5)
CO2: 26 mEq/L (ref 19–32)
Chloride: 102 mEq/L (ref 96–112)
Creatinine, Ser: 1.05 mg/dL (ref 0.50–1.35)
GFR calc non Af Amer: 78 mL/min — ABNORMAL LOW (ref >90)
GLUCOSE: 231 mg/dL — AB (ref 70–99)
POTASSIUM: 3.5 meq/L — AB (ref 3.7–5.3)
Sodium: 141 mEq/L (ref 137–147)
TOTAL PROTEIN: 6.6 g/dL (ref 6.0–8.3)
Total Bilirubin: 0.3 mg/dL (ref 0.3–1.2)

## 2013-09-14 LAB — GLUCOSE, CAPILLARY
GLUCOSE-CAPILLARY: 222 mg/dL — AB (ref 70–99)
GLUCOSE-CAPILLARY: 204 mg/dL — AB (ref 70–99)
Glucose-Capillary: 190 mg/dL — ABNORMAL HIGH (ref 70–99)
Glucose-Capillary: 218 mg/dL — ABNORMAL HIGH (ref 70–99)

## 2013-09-14 LAB — CBC WITH DIFFERENTIAL/PLATELET
Basophils Absolute: 0 10*3/uL (ref 0.0–0.1)
Basophils Relative: 0 % (ref 0–1)
EOS ABS: 0.2 10*3/uL (ref 0.0–0.7)
Eosinophils Relative: 2 % (ref 0–5)
HCT: 24.2 % — ABNORMAL LOW (ref 39.0–52.0)
HEMOGLOBIN: 8.3 g/dL — AB (ref 13.0–17.0)
Lymphocytes Relative: 16 % (ref 12–46)
Lymphs Abs: 1.4 10*3/uL (ref 0.7–4.0)
MCH: 29.9 pg (ref 26.0–34.0)
MCHC: 34.3 g/dL (ref 30.0–36.0)
MCV: 87.1 fL (ref 78.0–100.0)
MONOS PCT: 5 % (ref 3–12)
Monocytes Absolute: 0.4 10*3/uL (ref 0.1–1.0)
NEUTROS PCT: 78 % — AB (ref 43–77)
Neutro Abs: 6.8 10*3/uL (ref 1.7–7.7)
PLATELETS: 249 10*3/uL (ref 150–400)
RBC: 2.78 MIL/uL — ABNORMAL LOW (ref 4.22–5.81)
RDW: 13.3 % (ref 11.5–15.5)
WBC: 8.7 10*3/uL (ref 4.0–10.5)

## 2013-09-14 LAB — VANCOMYCIN, TROUGH: VANCOMYCIN TR: 16.1 ug/mL (ref 10.0–20.0)

## 2013-09-14 MED ORDER — ADULT MULTIVITAMIN W/MINERALS CH
1.0000 | ORAL_TABLET | Freq: Every day | ORAL | Status: DC
  Administered 2013-09-14 – 2013-10-22 (×39): 1 via ORAL
  Filled 2013-09-14 (×40): qty 1

## 2013-09-14 MED ORDER — GLUCERNA SHAKE PO LIQD
237.0000 mL | ORAL | Status: DC
  Administered 2013-09-15 – 2013-09-18 (×3): 237 mL via ORAL

## 2013-09-14 NOTE — Progress Notes (Signed)
VASCULAR LAB PRELIMINARY  PRELIMINARY  PRELIMINARY  PRELIMINARY  Right lower extremity venous Doppler completed.    Preliminary report:  There is no DVT or SVT noted in the right lower extremity.  Chevy Virgo, RVT 09/14/2013, 5:43 PM

## 2013-09-14 NOTE — Progress Notes (Signed)
Subjective/Complaints: 58 y.o. male with history of diabetes mellitus, HTN, h/o hepatitis, BLE numbness since frostbite last year but no medical care. He has been living in his car for past 4-5 years (had declined opportunity to live with family) who was brought to ED on 09/01/13 by family due to inability to care for self, confusion as well as left foot infection. X rays with extensive soft tissue gas consistent with cellulitis and abscess formation. He was found to have diabetic ketoacidosis and treated with insulin infusion. He was evaluated by Dr. Erlinda Hong and placed on IV antibiotics. His foot culture positive for proteus mirabilis--pan sensitive. MRI with for concern for necrotizing fascitis and patient taken to OR for I & D with 4 th and 5th toe amputation with placement of VAC. Also noted to have necrotic 1st, 2nd and 5 th toes on the right and likey allow auto amputation per ortho. 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 rate 19, height $RemoveBe'5\' 3"'UVxabjlbT$  (1.6 m), SpO2 93.00%. No results found. Results for orders placed during the hospital encounter of 09/13/13 (from the past 72 hour(s))  GLUCOSE, CAPILLARY     Status: Abnormal   Collection Time    09/13/13  4:45 PM      Result Value Ref Range   Glucose-Capillary 242 (*) 70 - 99 mg/dL  GLUCOSE, CAPILLARY     Status: Abnormal   Collection Time    09/13/13  9:14 PM      Result Value Ref Range   Glucose-Capillary 182 (*) 70 - 99 mg/dL  CBC WITH DIFFERENTIAL     Status: Abnormal   Collection Time    09/14/13  4:02 AM      Result Value Ref Range   WBC 8.7  4.0 - 10.5 K/uL   RBC 2.78 (*) 4.22 - 5.81 MIL/uL   Hemoglobin 8.3 (*) 13.0 - 17.0 g/dL   HCT 24.2 (*) 39.0 - 52.0 %   MCV 87.1  78.0 - 100.0 fL   MCH 29.9  26.0 - 34.0 pg   MCHC 34.3  30.0 -  36.0 g/dL   RDW 13.3  11.5 - 15.5 %   Platelets 249  150 - 400 K/uL   Neutrophils Relative % 78 (*) 43 - 77 %   Neutro Abs 6.8  1.7 - 7.7 K/uL   Lymphocytes Relative 16  12 - 46 %   Lymphs Abs 1.4  0.7 - 4.0 K/uL   Monocytes Relative 5  3 - 12 %   Monocytes Absolute 0.4  0.1 - 1.0 K/uL   Eosinophils Relative 2  0 - 5 %   Eosinophils Absolute 0.2  0.0 - 0.7 K/uL   Basophils Relative 0  0 - 1 %   Basophils Absolute 0.0  0.0 - 0.1 K/uL  COMPREHENSIVE METABOLIC PANEL     Status: Abnormal   Collection Time    09/14/13  4:02 AM      Result Value Ref Range   Sodium 141  137 - 147 mEq/L   Potassium 3.5 (*) 3.7 - 5.3 mEq/L   Chloride 102  96 - 112 mEq/L   CO2 26  19 - 32 mEq/L   Glucose, Bld 231 (*) 70 - 99 mg/dL   BUN 10  6 - 23 mg/dL   Creatinine,  Ser 1.05  0.50 - 1.35 mg/dL   Calcium 8.2 (*) 8.4 - 10.5 mg/dL   Total Protein 6.6  6.0 - 8.3 g/dL   Albumin 2.0 (*) 3.5 - 5.2 g/dL   AST 16  0 - 37 U/L   ALT 11  0 - 53 U/L   Alkaline Phosphatase 74  39 - 117 U/L   Total Bilirubin 0.3  0.3 - 1.2 mg/dL   GFR calc non Af Amer 78 (*) >90 mL/min   GFR calc Af Amer >90  >90 mL/min   Comment: (NOTE)     The eGFR has been calculated using the CKD EPI equation.     This calculation has not been validated in all clinical situations.     eGFR's persistently <90 mL/min signify possible Chronic Kidney     Disease.  GLUCOSE, CAPILLARY     Status: Abnormal   Collection Time    09/14/13  6:59 AM      Result Value Ref Range   Glucose-Capillary 222 (*) 70 - 99 mg/dL      HENT: dentition poor  Head: Normocephalic.  Eyes: EOM are normal.  Neck: Normal range of motion. Neck supple. No thyromegaly present.  Cardiovascular: Normal rate and regular rhythm. No murmurs  Respiratory: Effort normal and breath sounds normal. No respiratory distress.  GI: Soft. Bowel sounds are normal. He exhibits no distension.  Musculoskeletal:  Left coban dressing limiting ROM at knee, leg appropriately tender.   Shoulder , elbow, wrist And hand ROM normal  Neurological: He is alert and oriented to person, place, and time. A sensory deficit is present.  5/5 strength Bilateral delt, bi, tri, grip,  4/5 Bilateral HF and R KE  3- R ankle DF/PF 2-/5 Toe flexors and ext  LLE can be listed against gravity Absent sensation LT toes of R foot  SKIN: necrotic digits Right foot: 1,2 and 5. Skin very dry otherwise Ext no calf swelling or tenderness  Assessment/Plan: 1. Functional deficits secondary to Left BKA for gangrene (diabetic neuropathy, undiagnosed DM, no medical care) which require 3+ hours per day of interdisciplinary therapy in a comprehensive inpatient rehab setting. Physiatrist is providing close team supervision and 24 hour management of active medical problems listed below. Physiatrist and rehab team continue to assess barriers to discharge/monitor patient progress toward functional and medical goals. FIM:                                  Medical Problem List and Plan:  Left BKA, and R necrotic toes  1. DVT Prophylaxis/Anticoagulation: Add SQ lovenox. Check RLE dopplers  2. Pain Management: Prn oxcycodone.  3. Mood: Does not express any signs of distress or concerns. LCSW to follow for evaluation.  4. Neuropsych: This patient is capable of making decisions on his own behalf.  5. DM type 2 poorly controlled: Monitor BS with ac/hs checks. Will change lantus to 70/30 insulin as patient without insurance.titrate up Use SSI for elevated BS and titrate as indicated. Question compliance past discharge.  6. Recurrent fever: Question drug related as has had low grade fever with spike last pm. Will d/c cindamycin--qestion need for vancomycin. WBC is normal. Pan culture for Temp > 101. Rule out DVT.  -continue to closely watch the right foot.  -offloaded darko shoe for right lower ext   LOS (Days) 1 A FACE TO FACE EVALUATION WAS PERFORMED  Rocky Gladden E 09/14/2013, 9:53  AM

## 2013-09-14 NOTE — Progress Notes (Signed)
INITIAL NUTRITION ASSESSMENT  DOCUMENTATION CODES Per approved criteria  -Moderate malnutrition in the context of social/environmental circumstances   INTERVENTION: Decrease Glucerna Shake to po daily, each supplement provides 220 kcal and 10 grams of protein. Add MVI daily. Please obtain current weight on patient - discussed with nurse tech. RD provided brief DM education during this visit. RD to provide diet education at a more appropriate time.  NUTRITION DIAGNOSIS: Increased nutrient needs related to infected foot wound as evidenced by estimated nutrition needs.   Goal: Intake to meet >90% of estimated nutrition needs to support wound healing; optimal glycemic control to support healing.  Monitor:  PO intake, labs, weight trend, education needs  Reason for Assessment: MD Consult for Diabetes Diet Education  58 y.o. male  Admitting Dx: s/p transtibial amputation  ASSESSMENT: Patient with history of hepatitis; presented to the hospital on 2/1 with foot swelling since last week. Patient became incontinent of urine, feeling lightheaded, blurred vision, patient states that he started to loose balance as well.  X-ray on admission showed gas in the soft tissue. Underwent I&D with 4th and 5th amputation to L foot as well as placement of VAC. 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 Noted to refuse DM education by nursing staff during acute hospitalization.  Educated by RD staff on 2/4 during acute hospitalization - pt was given handouts and was noted to be feeling very overwhelmed with information. Pt was noted to be eating well during hospitalization and ordered for Glucerna Shakes PO BID. Confirmed with patient that he was eating well during acute hospitalization - RD confirmed with breakfast tray. Pt did state that he is unable to drink 2 Glucerna Shakes daily; will decrease to daily. RD was monitoring pt for refeeding syndrome; pt noted to live in  a car near his sister's house.  Most recent weight on patient is from 2/2 - discussed with nurse tech need for current weight.  Nutrition Focused Physical Exam:  Subcutaneous Fat:  Orbital Region: WNL Upper Arm Region: moderate depletion Thoracic and Lumbar Region: n/a  Muscle:  Temple Region: severe depletion Clavicle Bone Region: WNL Clavicle and Acromion Bone Region: WNL Scapular Bone Region: n/a Dorsal Hand: WNL Patellar Region: moderate depetion Anterior Thigh Region: moderate depletion Posterior Calf Region: moderate depletion  Edema: n/a  Pt meets criteria for moderate MALNUTRITION in the context of social/environmental circumstances as evidenced by moderate fat and muscle mass loss. Pt reports that he was eating fair PTA, would always have 1 good meal with his sister for dinner. Intake during the day was variable. He notes that he likes to go to [employer] and drink unsweet tea or diet soda and read for hours. Would not tell me about what he would eat.  RD attempted diet education but pt states that his mom was a diabetic and he knows what to do. Pt also reports that he has watched a lot of videos in the hospital and feels as though he has a good understanding. RD reviewed highlights - small, frequent meals, controlled CHO intake, limiting sweets/sodas, etc. Provided Balanced Plate Handout, pt states that he already had it and did not let RD review it. Will continue to monitor for education needs. Noted per team, ?compliance with DM when d/c given social hx.   Height: Ht Readings from Last 1 Encounters:  09/13/13 5\' 3"  (1.6 m)    Weight: Wt Readings from Last 1 Encounters:  09/02/13 148 lb (67.132 kg)  Ideal Body Weight: 56.4 kg  % Ideal Body Weight: 119%  Wt Readings from Last 10 Encounters:  09/02/13 148 lb (67.132 kg)  09/02/13 148 lb (67.132 kg)  09/02/13 148 lb (67.132 kg)    Usual Body Weight: unknown  % Usual Body Weight: N/A  BMI:  26.2 -  overweight (this is using weight form 2/2 prior to amputation)  Estimated Nutritional Needs: Kcal: 2000 Protein: 95 - 110 gm Fluid: 2 liters daily  Skin:  L leg incision from amputation Necrotic R toes  Diet Order: Carb Control  EDUCATION NEEDS: -Education not appropriate at this time   Intake/Output Summary (Last 24 hours) at 09/14/13 0956 Last data filed at 09/13/13 2209  Gross per 24 hour  Intake    240 ml  Output    550 ml  Net   -310 ml    Last BM: 2/11  Labs:   Recent Labs Lab 09/11/13 0348 09/13/13 0726 09/14/13 0402  NA 140 136* 141  K 4.2 3.8 3.5*  CL 99 100 102  CO2 27 26 26   BUN 12 10 10   CREATININE 1.06 1.04 1.05  CALCIUM 8.6 8.2* 8.2*  GLUCOSE 269* 225* 231*    CBG (last 3)   Recent Labs  09/13/13 1645 09/13/13 2114 09/14/13 0659  GLUCAP 242* 182* 222*    Scheduled Meds: . aspirin EC  325 mg Oral Daily  . enoxaparin (LOVENOX) injection  40 mg Subcutaneous Q24H  . feeding supplement (GLUCERNA SHAKE)  237 mL Oral BID BM  . insulin aspart  0-20 Units Subcutaneous TID WC  . insulin aspart  0-5 Units Subcutaneous QHS  . insulin aspart protamine- aspart  10 Units Subcutaneous BID WC  . piperacillin-tazobactam (ZOSYN)  IV  3.375 g Intravenous Q8H  . vancomycin  1,000 mg Intravenous Q12H    Continuous Infusions:    Past Medical History  Diagnosis Date  . Hepatitis   . Hypertension   . Pneumonia     HX OF PNA  . Diabetes mellitus without complication     PATIENT JUST LEARNED HE WAS DIABETIC  . Necrotizing fasciitis   . Hyponatremia   . Hypokalemia     Past Surgical History  Procedure Laterality Date  . Cholecystectomy    . I&d extremity Left 09/03/2013    Procedure: IRRIGATION AND DEBRIDEMENT EXTREMITY;  Surgeon: Marianna Payment, MD;  Location: [place];  Service: Orthopedics;  Laterality: Left;  . I&d extremity Left 09/11/2013    Procedure: LEFT FOOT IRRIGATION AND DEBRIDEMENT;  Surgeon: Marianna Payment, MD;  Location: [place];  Service: Orthopedics;  Laterality: Left;  . Amputation Left 09/11/2013    Procedure: AMPUTATION BELOW KNEE;  Surgeon: Marianna Payment, MD;  Location: [place];  Service: Orthopedics;  Laterality: Left;    Inda Coke MS, RD, LDN Inpatient Registered Dietitian Pager: [phone] After-hours pager: [phone]

## 2013-09-14 NOTE — Progress Notes (Signed)
ANTIBIOTIC CONSULT NOTE - FOLLOW UP  Pharmacy Consult for Vancomycin Indication: Foot abscess, now s/p BKA.  No Known Allergies  Patient Measurements: Height: 5\' 3"  (160 cm) IBW/kg (Calculated) : 56.9 Adjusted Body Weight:   Vital Signs: Temp: 99.7 F (37.6 C) (02/14 0510) Temp src: Oral (02/14 0510) BP: 143/78 mmHg (02/14 0510) Pulse Rate: 87 (02/14 0510) Intake/Output from previous day: 02/13 0701 - 02/14 0700 In: 240 [P.O.:240] Out: 550 [Urine:550] Intake/Output from this shift:    Labs:  Recent Labs  09/12/13 1026 09/13/13 0726 09/14/13 0402  WBC 15.1* 11.2* 8.7  HGB 8.6* 8.3* 8.3*  PLT 235 240 249  CREATININE  --  1.04 1.05   The CrCl is unknown because both a height and weight (above a minimum accepted value) are required for this calculation.  Recent Labs  09/14/13 1000  VANCOTROUGH 16.1     Microbiology: Recent Results (from the past 720 hour(s))  WOUND CULTURE     Status: None   Collection Time    09/01/13  8:18 PM      Result Value Ref Range Status   Specimen Description WOUND LEFT FOOT   Final   Special Requests NONE   Final   Gram Stain     Final   Value: NO WBC SEEN     NO SQUAMOUS EPITHELIAL CELLS SEEN     MODERATE GRAM POSITIVE COCCI IN PAIRS     MODERATE GRAM NEGATIVE RODS     Performed at [facility]   Culture     Final   Value: FEW PROTEUS MIRABILIS     MODERATE STAPHYLOCOCCUS SPECIES (COAGULASE NEGATIVE)     Performed at [facility]   Report Status 09/07/2013 FINAL   Final   Organism ID, Bacteria PROTEUS MIRABILIS   Final  ANAEROBIC CULTURE     Status: None   Collection Time    09/03/13 12:03 PM      Result Value Ref Range Status   Specimen Description ABSCESS FOOT LEFT   Final   Special Requests PT ON VANCO ZOSYN   Final   Gram Stain     Final   Value: NO WBC SEEN     NO SQUAMOUS EPITHELIAL CELLS SEEN     MODERATE GRAM POSITIVE COCCI IN PAIRS     Performed at [facility]   Culture     Final   Value: NO ANAEROBES ISOLATED     Performed at [facility]   Report Status 09/08/2013 FINAL   Final  CULTURE, ROUTINE-ABSCESS     Status: None   Collection Time    09/03/13 12:03 PM      Result Value Ref Range Status   Specimen Description ABSCESS FOOT LEFT   Final   Special Requests PT ON VANCO ZOSYN   Final   Gram Stain     Final   Value: NO WBC SEEN     NO SQUAMOUS EPITHELIAL CELLS SEEN     MODERATE GRAM POSITIVE COCCI IN PAIRS     Performed at [facility]   Culture     Final   Value: MODERATE PROTEUS MIRABILIS     Performed at [facility]   Report Status 09/07/2013 FINAL   Final   Organism ID, Bacteria PROTEUS MIRABILIS   Final  CULTURE, BLOOD (ROUTINE X 2)     Status: None   Collection Time    09/03/13 10:00 PM      Result Value Ref  Range Status   Specimen Description BLOOD RIGHT HAND   Final   Special Requests BOTTLES DRAWN AEROBIC ONLY 3CC   Final   Culture  Setup Time     Final   Value: 09/04/2013 03:30     Performed at [facility]   Culture     Final   Value: NO GROWTH 5 DAYS     Performed at [facility]   Report Status 09/10/2013 FINAL   Final  CULTURE, BLOOD (ROUTINE X 2)     Status: None   Collection Time    09/03/13 10:05 PM      Result Value Ref Range Status   Specimen Description BLOOD LEFT HAND   Final   Special Requests BOTTLES DRAWN AEROBIC ONLY 4CC   Final   Culture  Setup Time     Final   Value: 09/04/2013 03:30     Performed at [facility]   Culture     Final   Value: NO GROWTH 5 DAYS     Performed at [facility]   Report Status 09/10/2013 FINAL   Final  SURGICAL PCR SCREEN     Status: None   Collection Time    09/11/13  9:38 AM      Result Value Ref Range Status   MRSA, PCR NEGATIVE  NEGATIVE Final   Staphylococcus aureus NEGATIVE  NEGATIVE Final   Comment:            The Xpert SA Assay (FDA     approved for NASAL specimens     in patients over 63 years of age),     is one  component of     a comprehensive surveillance     program.  Test performance has     been validated by [employer] for patients greater     than or equal to 49 year old.     It is not intended     to diagnose infection nor to     guide or monitor treatment.    Anti-infectives   Start     Dose/Rate Route Frequency Ordered Stop   09/13/13 2300  vancomycin (VANCOCIN) IVPB 1000 mg/200 mL premix     1,000 mg 200 mL/hr over 60 Minutes Intravenous Every 12 hours 09/13/13 1645     09/13/13 2130  piperacillin-tazobactam (ZOSYN) IVPB 3.375 g     3.375 g 12.5 mL/hr over 240 Minutes Intravenous Every 8 hours 09/13/13 1645        Assessment: 58yo male on day#12 of Vancomycin and Zosyn.  Vancomycin trough remains in target range on 1000mg  IV q12 with Cr 1.05.  Goal of Therapy:  Vancomycin trough level 15-20 mcg/ml  Plan:  1-  Continue current doses of Vancomycin and Zosyn 2-  Continue weekly Vanc trough 3-  What is planned length of antibiotic therapy?  Gracy Bruins, PharmD Clinical Pharmacist [facility]

## 2013-09-14 NOTE — Progress Notes (Signed)
Resp Care Note; Called to assess pt due to low sats ( 77% on room air) the patient actively vomiting.Placed pt on O2 at 3lpm sats improved to 87-88%,increased O2 to 6lpm sats 93%,HR 129, BP 146/81.BBS diminished,pt in no apparent resp distress.  MD called by Dia Crawford and labs pending.

## 2013-09-14 NOTE — Progress Notes (Signed)
Physical Therapy Session Note  Patient Details  Name: [name] MRN: [id number] Date of Birth: 11/13/1955  Today's Date: 09/14/2013 Time: 1500-1530 Time Calculation (min): 30 min  Therapy Documentation Precautions:  Precautions Precautions: Fall Restrictions Weight Bearing Restrictions: Yes LLE Weight Bearing: Non weight bearing Pain: denies pain throughout tx session  Therapeutic Activity:(15') Mat table mobility to stretch hip flexors and knee flexors in prone, transfers mat<->w/c min-assist via stand-pivot transfer  Therapeutic Exercise:(15') L LE lifts in prone and R side lying with rest breaks.   Therapy/Group: Individual Therapy  Clearence Ped 09/14/2013, 5:26 PM

## 2013-09-14 NOTE — Plan of Care (Signed)
Problem: RH SKIN INTEGRITY Goal: RH STG ABLE TO PERFORM INCISION/WOUND CARE W/ASSISTANCE STG Able To Perform Incision/Wound Care With min  Assistance.  Outcome: Not Applicable Date Met:  35/67/01 No order for dressing change at this time.

## 2013-09-14 NOTE — Evaluation (Signed)
Occupational Therapy Assessment and Plan & Session Notes  Patient Details  Name: [name] MRN: [id number] Date of Birth: 1956-01-06  OT Diagnosis: acute pain and muscle weakness (generalized) Rehab Potential: Rehab Potential: Good ELOS: ~7 days   Today's Date: 09/14/2013  Problem List:  Patient Active Problem List   Diagnosis Date Noted  . Unilateral complete BKA 09/13/2013  . DKA (diabetic ketoacidoses) 09/04/2013  . Cellulitis and abscess of foot 09/02/2013  . Abscess of foot 09/01/2013  . DM (diabetes mellitus) type II controlled, neurological manifestation 09/01/2013    Past Medical History:  Past Medical History  Diagnosis Date  . Hepatitis   . Hypertension   . Pneumonia     HX OF PNA  . Diabetes mellitus without complication     PATIENT JUST LEARNED HE WAS DIABETIC  . Necrotizing fasciitis   . Hyponatremia   . Hypokalemia    Past Surgical History:  Past Surgical History  Procedure Laterality Date  . Cholecystectomy    . I&d extremity Left 09/03/2013    Procedure: IRRIGATION AND DEBRIDEMENT EXTREMITY;  Surgeon: Marianna Payment, MD;  Location: [place];  Service: Orthopedics;  Laterality: Left;  . I&d extremity Left 09/11/2013    Procedure: LEFT FOOT IRRIGATION AND DEBRIDEMENT;  Surgeon: Marianna Payment, MD;  Location: [place];  Service: Orthopedics;  Laterality: Left;  . Amputation Left 09/11/2013    Procedure: AMPUTATION BELOW KNEE;  Surgeon: Marianna Payment, MD;  Location: [place];  Service: Orthopedics;  Laterality: Left;     Clinical Impression: [name] is a 58 y.o. male with history of diabetes mellitus, HTN, h/o hepatitis, BLE numbness since frostbite last year but no medical care. He has been living in his car for past 4-5 years (had declined opportunity to live with family) who was brought to ED on 09/01/13 by family due to inability to care for self, confusion as well as left foot infection. X rays with extensive soft tissue gas consistent with  cellulitis and abscess formation. He was found to have diabetic ketoacidosis and treated with insulin infusion. He was evaluated by Dr. Erlinda Hong and placed on IV antibiotics. His foot culture positive for proteus mirabilis--pan sensitive. MRI with for concern for necrotizing fascitis and patient taken to OR for I & D with 4 th and 5th toe amputation with placement of VAC. Also noted to have necrotic 1st, 2nd and 5 th toes on the right and likey allow auto amputation per ortho. 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 Electrolyte abnormalities being monitored with concerns for refeeding syndrome. Mentation has improved and leucocytosis resolving. He was started on insulin for new diagnosis of diabetes--refusing diabetes education per nursing. Has been on IV clindamycin, zosyn and vancomycin since admission and spiked temp of 102.4 last pm. Therapies ongoing and patient making good progress. Rehab team recommended CIR and patient admitted today. Patient transferred to CIR on 09/13/2013 .    Patient currently requires min with basic self-care skills secondary to muscle weakness and muscle joint tightness and decreased standing balance, decreased postural control, decreased balance strategies and difficulty maintaining precautions.  Prior to hospitalization, patient could complete ADLs independently.   Patient will benefit from skilled intervention to increase independence with basic self-care skills prior to discharge home independently.  Anticipate patient will require intermittent supervision and no further OT follow recommended.  OT - End of Session Activity Tolerance: Tolerates 30+ min activity with multiple rests Endurance Deficit: Yes OT Assessment  Rehab Potential: Good Barriers to Discharge: Inaccessible home environment Barriers to Discharge Comments: Patient reports he lives at his sisters house, but sleeps in his car. Patient reports the reason he doesn't sleep at  his sisters is because she only has one bed.  OT Patient demonstrates impairments in the following area(s): Balance;Endurance;Pain;Safety;Perception;Skin Integrity OT Basic ADL's Functional Problem(s): Grooming;Dressing;Bathing;Toileting OT Advanced ADL's Functional Problem(s):  (n/a at this time) OT Transfers Functional Problem(s): Toilet;Tub/Shower OT Additional Impairment(s): None OT Plan OT Intensity: Minimum of 1-2 x/day, 45 to 90 minutes OT Frequency: 5 out of 7 days OT Duration/Estimated Length of Stay: ~7 days OT Treatment/Interventions: Balance/vestibular training;Community reintegration;Discharge planning;Disease mangement/prevention;DME/adaptive equipment instruction;Functional mobility training;Pain management;Patient/family education;Psychosocial support;Self Care/advanced ADL retraining;Skin care/wound managment;Therapeutic Activities;Therapeutic Exercise;UE/LE Strength taining/ROM;UE/LE Coordination activities;Wheelchair propulsion/positioning OT Self Feeding Anticipated Outcome(s): Indepenent (current level) OT Basic Self-Care Anticipated Outcome(s): mod I OT Toileting Anticipated Outcome(s): mod I OT Bathroom Transfers Anticipated Outcome(s): mod I OT Recommendation Patient destination: Home Follow Up Recommendations: None Equipment Recommended: 3 in 1 bedside comode;Tub/shower seat;Tub/shower bench Equipment Details: shower seat vs bench  Precautions/Restrictions  Precautions Precautions: Fall Restrictions Weight Bearing Restrictions: Yes LLE Weight Bearing: Non weight bearing  General Chart Reviewed: Yes Family/Caregiver Present: No  Pain Pain Assessment Pain Assessment: 0-10 Pain Score: 7  Pain Type: Phantom pain Pain Location: Leg Pain Orientation: Left Pain Descriptors / Indicators: Constant Pain Intervention(s): RN made aware;Repositioned  Home Living/Prior Functioning Home Living Type of Home: House Home Access: Stairs to enter Engineer, site of Steps: 2-3 Entrance Stairs-Rails: None Home Layout: Two level;Able to live on main level with bedroom/bathroom Additional Comments: Patient states he lives at his sister's house, but sleeps in his car  Lives With: Family IADL History Homemaking Responsibilities: No Current License: Yes Mode of Transportation: Car Occupation: Unemployed Prior Function Level of Independence: Independent with basic ADLs;Independent with gait;Independent with transfers  Able to Take Stairs?: Yes Driving: Yes  ADL - See FIM  Vision/Perception  Vision - History Baseline Vision: Wears glasses all the time Patient Visual Report: Blurring of vision (especially when reading) Vision - Assessment Additional Comments: patient states he plans to follow up with his eye doctor regarding his vision problems Perception Perception: Within Functional Limits Praxis Praxis: Intact   Cognition Overall Cognitive Status: Within Functional Limits for tasks assessed Orientation Level: Oriented X4 Memory: Appears intact Awareness: Appears intact Problem Solving: Appears intact Safety/Judgment: Appears intact  Sensation Sensation Additional Comments: BUEs appear intact Coordination Gross Motor Movements are Fluid and Coordinated: Yes Fine Motor Movements are Fluid and Coordinated: Yes  Mobility  Bed Mobility Bed Mobility: Supine to Sit;Sit to Supine   Trunk/Postural Assessment  Postural Control Postural Control: Within Functional Limits   Balance Balance Balance Assessed: Yes Static Sitting Balance Static Sitting - Balance Support: Feet supported (L BKA) Static Sitting - Level of Assistance: 7: Independent Static Sitting - Comment/# of Minutes: 5' EOB Dynamic Sitting Balance Dynamic Sitting - Balance Support: Feet supported;Left upper extremity supported Dynamic Sitting - Level of Assistance: 6: Modified independent (Device/Increase time) Dynamic Sitting - Balance Activities: Lateral  lean/weight shifting;Forward lean/weight shifting Static Standing Balance Static Standing - Balance Support: Bilateral upper extremity supported Static Standing - Level of Assistance: 4: Min assist Static Standing - Comment/# of Minutes: 1' (L BKA, Gangrene R toes 1,2 and,5) Dynamic Standing Balance Dynamic Standing - Balance Support: Bilateral upper extremity supported Dynamic Standing - Level of Assistance: 4: Min assist Dynamic Standing - Balance Activities: Lateral lean/weight shifting;Forward lean/weight shifting  Extremity/Trunk  Assessment RUE Assessment RUE Assessment: Within Functional Limits (patient can benefit from BUE strengthening) LUE Assessment LUE Assessment: Within Functional Limits (patient can benefit from BUE strengthening)  FIM:  FIM - Eating Eating Activity: 7: Complete independence:no helper FIM - Grooming Grooming Steps: Wash, rinse, dry face;Wash, rinse, dry hands;Brush, comb hair Grooming: 5: Set-up assist to obtain items FIM - Upper Body Dressing/Undressing Upper body dressing/undressing: 0: Wears gown/pajamas-no public clothing FIM - Lower Body Dressing/Undressing Lower body dressing/undressing: 0: Wears gown/pajamas-no public clothing (patient required assistance to don right sock) FIM - Toileting Toileting: 0: Activity did not occur FIM - Bed/Chair Transfer Bed/Chair Transfer: 5: Supine > Sit: Supervision (verbal cues/safety issues);4: Bed > Chair or W/C: Min A (steadying Pt. > 75%) FIM - Air cabin crew Transfers: 0-Activity did not occur FIM - Tub/Shower Transfers Tub/shower Transfers: 0-Activity did not occur or was simulated   Refer to Care Plan for Long Term Goals  Recommendations for other services: None recommended at this time.   Discharge Criteria: Patient will be discharged from OT if patient refuses treatment 3 consecutive times without medical reason, if treatment goals not met, if there is a change in medical status, if patient  makes no progress towards goals or if patient is discharged from hospital.  The above assessment, treatment plan, treatment alternatives and goals were discussed and mutually agreed upon: by patient  -----------------------------------------------------------------------------------------------------------------------------------------------------  SESSION NOTES  Session #1 0930-1030 - 60 Minutes Individual Therapy Patient with 7/10 complaints of pain in left residual limb "Ghost pain", RN aware and therapist assisted through re-positioning and teaching patient techniques to relieve the pain Initial 1:1 occupational therapy evaluation completed. Focused skilled intervention on bed mobility, dynamic sitting edge of bed, donning of sock, EOB> w/c stand pivot transfer, UB/LB bathing tasks in sit<>stand position at sink, and overall activity tolerance/endurance. Patient is aware of his deficits, stating he needs to work on his balance. Patient cognitively intact, but with noticeable poor hygiene in the past. When therapist asked patient how he doffed/donned his socks at home he made comment, "I don't normally do it much because it's too hard". Plan to incorporate AE if needed to increase patient's independence with LB tasks in order to help improve his overall hygiene. At end of session, left patient seated in w/c beside bed with all needed items within reach, patient aware not to transfer or get up without assistance from staff.   Session #2 1420-1500 - 40 Minutes Individual Therapy No complaints of pain Upon entering room, patient found supine in bed. Patient engaged in bed mobility with close supervision. From here, patient transferred EOB> w/c with minimal assistance (stand pivot technique). Patient then worked on maneuver w/c > bathroom for toilet transfer on/off elevated toilet seat. Patient required min assist for transfers while using grab bars; min assist required for decreased balance and  decreased coordination during transfer. Patient able to perform toileting tasks of peri care and clothing management with max assist. From here, patient sat at sink for grooming task of washing hands. Patient then propelled self > therapy gym for focus on squat pivot transfer, safety with transfer, w/c management, tricep pushups seated edge of mat, and sit<>stands using RW. Therapist educated patient on squat pivot transfers as opposed to stand pivot transfer when not using RW. At end of session, left patient seated edge of mat waiting for PT for next therapy session.   Yarelie Hams 09/14/2013, 2:31 PM

## 2013-09-14 NOTE — Evaluation (Signed)
Physical Therapy Assessment and Plan  Patient Details  Name: [name] MRN: [id number] Date of Birth: Jan 14, 1956  PT Diagnosis: Difficulty walking and Pain in L LE (phantom with L BKA) Rehab Potential: Good ELOS: 7 to 10 days   Today's Date: 09/14/2013 Time: 1100-1200 Time Calculation (min): 60 min  Problem List:  Patient Active Problem List   Diagnosis Date Noted  . Unilateral complete BKA 09/13/2013  . DKA (diabetic ketoacidoses) 09/04/2013  . Cellulitis and abscess of foot 09/02/2013  . Abscess of foot 09/01/2013  . DM (diabetes mellitus) type II controlled, neurological manifestation 09/01/2013    Past Medical History:  Past Medical History  Diagnosis Date  . Hepatitis   . Hypertension   . Pneumonia     HX OF PNA  . Diabetes mellitus without complication     PATIENT JUST LEARNED HE WAS DIABETIC  . Necrotizing fasciitis   . Hyponatremia   . Hypokalemia    Past Surgical History:  Past Surgical History  Procedure Laterality Date  . Cholecystectomy    . I&d extremity Left 09/03/2013    Procedure: IRRIGATION AND DEBRIDEMENT EXTREMITY;  Surgeon: Marianna Payment, MD;  Location: [place];  Service: Orthopedics;  Laterality: Left;  . I&d extremity Left 09/11/2013    Procedure: LEFT FOOT IRRIGATION AND DEBRIDEMENT;  Surgeon: Marianna Payment, MD;  Location: [place];  Service: Orthopedics;  Laterality: Left;  . Amputation Left 09/11/2013    Procedure: AMPUTATION BELOW KNEE;  Surgeon: Marianna Payment, MD;  Location: [place];  Service: Orthopedics;  Laterality: Left;    Assessment & Plan Clinical Impression: [name] is a 58 y.o. male with history of diabetes mellitus, HTN, h/o hepatitis, BLE numbness since frostbite last year but no medical care. He has been living in his car for past 4-5 years (had declined opportunity to live with family) who was brought to ED on 09/01/13 by family due to inability to care for self, confusion as well as left foot infection. X rays with  extensive soft tissue gas consistent with cellulitis and abscess formation. He was found to have diabetic ketoacidosis and treated with insulin infusion. He was evaluated by Dr. Erlinda Hong and placed on IV antibiotics. His foot culture positive for proteus mirabilis--pan sensitive. MRI with for concern for necrotizing fascitis and patient taken to OR for I & D with 4 th and 5th toe amputation with placement of VAC. Also noted to have necrotic 1st, 2nd and 5 th toes on the right and likey allow auto amputation per ortho. 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 Electrolyte abnormalities being monitored with concerns for refeeding syndrome. Mentation has improved and leucocytosis resolving. He was started on insulin for new diagnosis of diabetes--refusing diabetes education per nursing. Has been on IV clindamycin, zosyn and vancomycin since admission and spiked temp of 102.4 last pm. Therapies ongoing and patient making good progress. Patient transferred to CIR on 09/13/2013 .   Patient currently requires min with mobility secondary to muscle weakness and balance with L BKA and new COG and BOS.  Prior to hospitalization, patient was independent  with mobility and lived with Family in a House home.  Home access is 1/2 step then full step onto porch and into homeStairs to enter.  Patient will benefit from skilled PT intervention to maximize safe functional mobility, minimize fall risk and decrease caregiver burden for planned discharge home alone.  Anticipate patient will benefit from follow up [place] at discharge.  PT - End of Session Activity Tolerance: Tolerates 30+ min activity with multiple rests Endurance Deficit: Yes PT Assessment Rehab Potential: Good Barriers to Discharge: Decreased caregiver support PT Patient demonstrates impairments in the following area(s): Balance;Endurance;Motor;Safety PT Transfers Functional Problem(s): Bed Mobility;Bed to  Chair;Car;Furniture;Floor PT Locomotion Functional Problem(s): Ambulation;Stairs PT Plan PT Intensity: Minimum of 1-2 x/day ,45 to 90 minutes PT Frequency: 5 up to 7 days PT Duration Estimated Length of Stay: 7 to 10 days PT Treatment/Interventions: Ambulation/gait training;Balance/vestibular training;DME/adaptive equipment instruction;Functional mobility training Pain management;Patient/family education;Skin care/wound management;Stair training;Therapeutic Activities;Therapeutic Exercise;UE/LE Strength taining/ROM PT Transfers Anticipated Outcome(s): Mod-Independent PT Locomotion Anticipated Outcome(s): Mod-Independent car to house with 2 short steps to enter (no handrails, but states that he typically can reach door frame for stability up/down 2 steps) PT Recommendation Follow Up Recommendations: Home health PT Patient destination: Home Equipment Recommended: Rolling walker with 5" wheels;Standard walker;Other (comment) Equipment Details: may best use standard walker for car to house, single crutch while other hand grabs door frame up/down 2 short steps, and RW inside house.   PT Evaluation Precautions/Restrictions Precautions Precautions: Fall Restrictions Weight Bearing Restrictions: Yes LLE Weight Bearing: Non weight bearing General Chart Reviewed: Yes Family/Caregiver Present: Yes (sister [name]))  Pain Pain Assessment Pain Assessment: 0-10 Pain Score: 0-No pain (0/10 currently) Pain Type: Phantom pain Pain Location: Leg Pain Orientation: Left Pain Descriptors / Indicators: Constant Patients Stated Pain Goal: 3 Pain Intervention(s): RN made aware;Repositioned Multiple Pain Sites: No Home Living/Prior Functioning Home Living Type of Home: House Home Access: Stairs to enter [employer] of Steps: 1/2 step then full step onto porch and into home Entrance Stairs-Rails: None Home Layout: Two level;Able to live on main level with bedroom/bathroom Additional  Comments: Patient states he lives at his sister's house, but sleeps in his car  Lives With: Family Prior Function Level of Independence: Independent with basic ADLs;Independent with gait;Independent with transfers  Able to Take Stairs?: Yes Driving: Yes Vocation: Unemployed Vision/Perception  Vision - History Baseline Vision: Wears glasses all the time Patient Visual Report: No change from baseline Vision - Assessment Additional Comments: patient states he plans to follow up with his eye doctor regarding his vision problems Perception Perception: Within Functional Limits Praxis Praxis: Intact  Cognition Overall Cognitive Status: Within Functional Limits for tasks assessed Orientation Level: Oriented X4 Memory: Appears intact Awareness: Appears intact Problem Solving: Appears intact Safety/Judgment: Appears intact Sensation Sensation Light Touch: Appears Intact (R foot and toes 3 and 4 SILT) Additional Comments: BUEs appear intact Coordination Gross Motor Movements are Fluid and Coordinated: Yes Fine Motor Movements are Fluid and Coordinated: Yes Heel Shin Test: NA, L BKA Mobility Bed Mobility Bed Mobility: Supine to Sit;Sit to Supine Locomotion  Ambulation Ambulation: Yes Ambulation/Gait Assistance: 4: Min assist Ambulation Distance (Feet): 40 Feet Assistive device: Rolling walker Gait Gait: Yes Gait Pattern: Impaired (L BKA) Gait Pattern: Step-to pattern Gait velocity: decreased Stairs / Additional Locomotion Stairs: No (Not tested today (awaiting Darco L forefoot off loading shoe)) Wheelchair Mobility Wheelchair Mobility: Yes Wheelchair Assistance: 5: Supervision;4: Min guard (IV Line Management) Wheelchair Propulsion: Both upper extremities Wheelchair Parts Management: Needs assistance Distance: 8'  Trunk/Postural Assessment  Postural Control Postural Control: Within Functional Limits  Balance Balance Balance Assessed: Yes Static Sitting Balance Static  Sitting - Balance Support: Feet supported (L BKA) Static Sitting - Level of Assistance: 7: Independent Static Sitting - Comment/# of Minutes: 5' EOB Dynamic Sitting Balance Dynamic Sitting - Balance Support: Feet supported;Left upper extremity supported Dynamic Sitting -  Level of Assistance: 6: Modified independent (Device/Increase time) Dynamic Sitting - Balance Activities: Lateral lean/weight shifting;Forward lean/weight shifting Static Standing Balance Static Standing - Balance Support: Bilateral upper extremity supported Static Standing - Level of Assistance: 4: Min assist Static Standing - Comment/# of Minutes: 1' (L BKA, Gangrene R toes 1,2 and,5) Dynamic Standing Balance Dynamic Standing - Balance Support: Bilateral upper extremity supported Dynamic Standing - Level of Assistance: 4: Min assist Dynamic Standing - Balance Activities: Lateral lean/weight shifting;Forward lean/weight shifting  FIM:  FIM - Locomotion: Wheelchair Locomotion: Wheelchair: 2: Travels 50 - 149 ft with supervision, cueing or coaxing FIM - Locomotion: Ambulation Locomotion: Ambulation: 1: Travels less than 50 ft with minimal assistance (Pt.>75%) FIM - Locomotion: Stairs Locomotion: Stairs: 0: Activity did not occur   Refer to Care Plan for Long Term Goals  Recommendations for other services: None  Discharge Criteria: Patient will be discharged from PT if patient refuses treatment 3 consecutive times without medical reason, if treatment goals not met, if there is a change in medical status, if patient makes no progress towards goals or if patient is discharged from hospital.  The above assessment, treatment plan, treatment alternatives and goals were discussed and mutually agreed upon: by patient and by family  Clearence Ped 09/14/2013, 4:52 PM

## 2013-09-15 ENCOUNTER — Inpatient Hospital Stay (HOSPITAL_COMMUNITY): Payer: Medicaid Other

## 2013-09-15 LAB — CBC
HCT: 25.2 % — ABNORMAL LOW (ref 39.0–52.0)
Hemoglobin: 8.4 g/dL — ABNORMAL LOW (ref 13.0–17.0)
MCH: 29.1 pg (ref 26.0–34.0)
MCHC: 33.3 g/dL (ref 30.0–36.0)
MCV: 87.2 fL (ref 78.0–100.0)
Platelets: 268 K/uL (ref 150–400)
RBC: 2.89 MIL/uL — ABNORMAL LOW (ref 4.22–5.81)
RDW: 13.4 % (ref 11.5–15.5)
WBC: 8.8 K/uL (ref 4.0–10.5)

## 2013-09-15 LAB — URINE MICROSCOPIC-ADD ON

## 2013-09-15 LAB — URINALYSIS, ROUTINE W REFLEX MICROSCOPIC
BILIRUBIN URINE: NEGATIVE
GLUCOSE, UA: 100 mg/dL — AB
Ketones, ur: NEGATIVE mg/dL
Leukocytes, UA: NEGATIVE
Nitrite: NEGATIVE
PROTEIN: 30 mg/dL — AB
Specific Gravity, Urine: 1.021 (ref 1.005–1.030)
UROBILINOGEN UA: 0.2 mg/dL (ref 0.0–1.0)
pH: 5.5 (ref 5.0–8.0)

## 2013-09-15 LAB — GLUCOSE, CAPILLARY
GLUCOSE-CAPILLARY: 259 mg/dL — AB (ref 70–99)
GLUCOSE-CAPILLARY: 238 mg/dL — AB (ref 70–99)
Glucose-Capillary: 232 mg/dL — ABNORMAL HIGH (ref 70–99)
Glucose-Capillary: 217 mg/dL — ABNORMAL HIGH (ref 70–99)

## 2013-09-15 MED ORDER — INSULIN ASPART PROT & ASPART (70-30 MIX) 100 UNIT/ML ~~LOC~~ SUSP
12.0000 [IU] | Freq: Two times a day (BID) | SUBCUTANEOUS | Status: DC
  Administered 2013-09-15 – 2013-09-18 (×6): 12 [IU] via SUBCUTANEOUS

## 2013-09-15 NOTE — Progress Notes (Signed)
Heard patient in hall retching as if to vomit, reported to room and patient requested a emesis bin. Small amount red/clear emesis noted. Patient reported that he had jello earlier that day. Checked VS, b/p 146/81, hr 127, temp 103.0, O2 sats 78%, resp 22. RT notified and MD called and orders received. Applied O2 at 3L, increased  sats to 94%. Patient clammy like, but denies any further symptoms. Chest XRay resulted, MD called and notified of results. Orders received. Recheck O2 sats, 94%, HR 126, and temp 100.7. Tylenol 650 mg given, will continue to monitor patient. Patient reports feeling "much cooler" now. Nicosha Struve, Dione Plover'

## 2013-09-15 NOTE — IPOC Note (Signed)
Overall Plan of Care [facility]) Patient Details Name: [name] MRN: [id number] DOB: [birth year]  Admitting Diagnosis: L BKA  Hospital Problems: Active Problems:   Unilateral complete BKA     Functional Problem List: Nursing Behavior;Edema;Endurance;Medication Management;Motor;Nutrition;Pain;Perception;Safety;Sensory;Skin Integrity  PT Balance;Endurance;Motor;Safety  OT Balance;Endurance;Pain;Safety;Perception;Skin Integrity  SLP    TR         Basic ADL's: OT Grooming;Dressing;Bathing;Toileting     Advanced  ADL's: OT  (n/a at this time)     Transfers: PT Bed Mobility;Bed to Chair;Car;Furniture;Floor  OT Toilet;Tub/Shower     Locomotion: PT Ambulation;Stairs     Additional Impairments: OT None  SLP        TR      Anticipated Outcomes Item Anticipated Outcome  Self Feeding Indepenent (current level)  Swallowing      Basic self-care  mod I  Toileting  mod I   Bathroom Transfers mod I  Bowel/Bladder  Continent of bowel and bladder  Transfers  Mod-Independent  Locomotion  Mod-Independent car to house with 2 short steps to enter (no handrails, but states that he typically can reach door frame for stability up/down 2 steps)  Communication     Cognition     Pain  < 3 on a 0-10 scale  Safety/Judgment  Min A   Therapy Plan: PT Intensity: Minimum of 1-2 x/day ,45 to 90 minutes PT Frequency: 5 out of 7 days PT Duration Estimated Length of Stay: 7 to 10 days OT Intensity: Minimum of 1-2 x/day, 45 to 90 minutes OT Frequency: 5 out of 7 days OT Duration/Estimated Length of Stay: ~7 days         Team Interventions: Nursing Interventions Patient/Family Education;Disease Management/Prevention;Pain Management;Medication Management;Skin Care/Wound Management;Cognitive Remediation/Compensation;Discharge Planning;Psychosocial Support  PT interventions Ambulation/gait training;Balance/vestibular training;DME/adaptive equipment instruction;Functional mobility  training;Pain management;Patient/family education;Skin care/wound management;Stair training;Therapeutic Activities;Therapeutic Exercise;UE/LE Strength taining/ROM  OT Interventions Balance/vestibular training;Community reintegration;Discharge planning;Disease mangement/prevention;DME/adaptive equipment instruction;Functional mobility training;Pain management;Patient/family education;Psychosocial support;Self Care/advanced ADL retraining;Skin care/wound managment;Therapeutic Activities;Therapeutic Exercise;UE/LE Strength taining/ROM;UE/LE Coordination activities;Wheelchair propulsion/positioning  SLP Interventions    TR Interventions    SW/CM Interventions      Team Discharge Planning: Destination: PT-Home ,OT- Home , SLP-  Projected Follow-up: PT-Home health PT, OT-  None, SLP-  Projected Equipment Needs: PT-Rolling walker with 5" wheels;Standard walker;Other (comment), OT- 3 in 1 bedside comode;Tub/shower seat;Tub/shower bench, SLP-  Equipment Details: PT-may best use standard walker for car to house, single crutch while other hand grabs door frame up/down 2 short steps, and RW inside house., OT-shower seat vs bench Patient/family involved in discharge planning: PT- Patient;Family member/caregiver (sister (Doris present)),  OT-Patient, SLP-   MD ELOS: 7-10d Medical Rehab Prognosis:  Excellent Assessment: 58 y.o. male with history of diabetes mellitus, HTN, h/o hepatitis, BLE numbness since frostbite last year but no medical care. He has been living in his car for past 4-5 years (had declined opportunity to live with family) who was brought to ED on 09/01/13 by family due to inability to care for self, confusion as well as left foot infection. X rays with extensive soft tissue gas consistent with cellulitis and abscess formation. He was found to have diabetic ketoacidosis and treated with insulin infusion. He was evaluated by Dr. Erlinda Hong and placed on IV antibiotics. His foot culture positive for proteus  mirabilis--pan sensitive. MRI with for concern for necrotizing fascitis and patient taken to OR for I & D with 4 th and 5th toe amputation with placement of VAC. Also noted to have necrotic 1st, 2nd and  5 th toes on the right and likey allow auto amputation per ortho. 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  Treatment team will focus on ADLs and mobility with goals set at [facility]   See Team Conference Notes for weekly updates to the plan of care

## 2013-09-15 NOTE — Progress Notes (Signed)
Patient appears to have slept for about 2.5-3 hours. Pateint has no complaints. VSS this am upon routine check. Continue plan of care.  Elyanah Farino, Dione Plover

## 2013-09-15 NOTE — Progress Notes (Signed)
Subjective/Complaints: 58 y.o. male with history of diabetes mellitus, HTN, h/o hepatitis, BLE numbness since frostbite last year but no medical care. He has been living in his car for past 4-5 years (had declined opportunity to live with family) who was brought to ED on 09/01/13 by family due to inability to care for self, confusion as well as left foot infection. X rays with extensive soft tissue gas consistent with cellulitis and abscess formation. He was found to have diabetic ketoacidosis and treated with insulin infusion. He was evaluated by Dr. Erlinda Hong and placed on IV antibiotics. His foot culture positive for proteus mirabilis--pan sensitive. MRI with for concern for necrotizing fascitis and patient taken to OR for I & D with 4 th and 5th toe amputation with placement of VAC. Also noted to have necrotic 1st, 2nd and 5 th toes on the right and likey allow auto amputation per ortho. 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 rate 20, height $RemoveBe'5\' 3"'mdlvMyWVL$  (1.6 m), weight 67.677 kg (149 lb 3.2 oz), SpO2 92.00%. Dg Chest Port 1 View  09/15/2013   CLINICAL DATA:  Febrile  EXAM: PORTABLE CHEST - 1 VIEW  COMPARISON:  None available  FINDINGS: The cardiac and mediastinal silhouettes are stable in size and contour, and remain within normal limits.  The lungs are normally inflated. There is patchy opacity within the right perihilar region, suspicious for possible infectious infiltrate. No other focal infiltrate. No pulmonary edema or pleural effusion. No pneumothorax.  No acute osseous abnormality identified.  IMPRESSION: Right perihilar opacity, suspicious for possible pneumonia.   Electronically Signed   By: Jeannine Boga M.D.   On: 09/15/2013 00:42   Results for  orders placed during the hospital encounter of 09/13/13 (from the past 72 hour(s))  GLUCOSE, CAPILLARY     Status: Abnormal   Collection Time    09/13/13  4:45 PM      Result Value Ref Range   Glucose-Capillary 242 (*) 70 - 99 mg/dL  GLUCOSE, CAPILLARY     Status: Abnormal   Collection Time    09/13/13  9:14 PM      Result Value Ref Range   Glucose-Capillary 182 (*) 70 - 99 mg/dL  CBC WITH DIFFERENTIAL     Status: Abnormal   Collection Time    09/14/13  4:02 AM      Result Value Ref Range   WBC 8.7  4.0 - 10.5 K/uL   RBC 2.78 (*) 4.22 - 5.81 MIL/uL   Hemoglobin 8.3 (*) 13.0 - 17.0 g/dL   HCT 24.2 (*) 39.0 - 52.0 %   MCV 87.1  78.0 - 100.0 fL   MCH 29.9  26.0 - 34.0 pg   MCHC 34.3  30.0 - 36.0 g/dL   RDW 13.3  11.5 - 15.5 %   Platelets 249  150 - 400 K/uL   Neutrophils Relative % 78 (*) 43 - 77 %   Neutro Abs 6.8  1.7 - 7.7 K/uL   Lymphocytes Relative 16  12 - 46 %   Lymphs Abs 1.4  0.7 - 4.0 K/uL   Monocytes Relative 5  3 - 12 %   Monocytes Absolute 0.4  0.1 - 1.0 K/uL   Eosinophils Relative 2  0 -  5 %   Eosinophils Absolute 0.2  0.0 - 0.7 K/uL   Basophils Relative 0  0 - 1 %   Basophils Absolute 0.0  0.0 - 0.1 K/uL  COMPREHENSIVE METABOLIC PANEL     Status: Abnormal   Collection Time    09/14/13  4:02 AM      Result Value Ref Range   Sodium 141  137 - 147 mEq/L   Potassium 3.5 (*) 3.7 - 5.3 mEq/L   Chloride 102  96 - 112 mEq/L   CO2 26  19 - 32 mEq/L   Glucose, Bld 231 (*) 70 - 99 mg/dL   BUN 10  6 - 23 mg/dL   Creatinine, Ser 1.05  0.50 - 1.35 mg/dL   Calcium 8.2 (*) 8.4 - 10.5 mg/dL   Total Protein 6.6  6.0 - 8.3 g/dL   Albumin 2.0 (*) 3.5 - 5.2 g/dL   AST 16  0 - 37 U/L   ALT 11  0 - 53 U/L   Alkaline Phosphatase 74  39 - 117 U/L   Total Bilirubin 0.3  0.3 - 1.2 mg/dL   GFR calc non Af Amer 78 (*) >90 mL/min   GFR calc Af Amer >90  >90 mL/min   Comment: (NOTE)     The eGFR has been calculated using the CKD EPI equation.     This calculation has not been  validated in all clinical situations.     eGFR's persistently <90 mL/min signify possible Chronic Kidney     Disease.  GLUCOSE, CAPILLARY     Status: Abnormal   Collection Time    09/14/13  6:59 AM      Result Value Ref Range   Glucose-Capillary 222 (*) 70 - 99 mg/dL  VANCOMYCIN, TROUGH     Status: None   Collection Time    09/14/13 10:00 AM      Result Value Ref Range   Vancomycin Tr 16.1  10.0 - 20.0 ug/mL  GLUCOSE, CAPILLARY     Status: Abnormal   Collection Time    09/14/13 12:15 PM      Result Value Ref Range   Glucose-Capillary 204 (*) 70 - 99 mg/dL  GLUCOSE, CAPILLARY     Status: Abnormal   Collection Time    09/14/13  4:12 PM      Result Value Ref Range   Glucose-Capillary 190 (*) 70 - 99 mg/dL  GLUCOSE, CAPILLARY     Status: Abnormal   Collection Time    09/14/13  9:27 PM      Result Value Ref Range   Glucose-Capillary 218 (*) 70 - 99 mg/dL   Comment 1 Notify RN    CBC     Status: Abnormal   Collection Time    09/15/13 12:30 AM      Result Value Ref Range   WBC 8.8  4.0 - 10.5 K/uL   RBC 2.89 (*) 4.22 - 5.81 MIL/uL   Hemoglobin 8.4 (*) 13.0 - 17.0 g/dL   HCT 25.2 (*) 39.0 - 52.0 %   MCV 87.2  78.0 - 100.0 fL   MCH 29.1  26.0 - 34.0 pg   MCHC 33.3  30.0 - 36.0 g/dL   RDW 13.4  11.5 - 15.5 %   Platelets 268  150 - 400 K/uL  URINALYSIS, ROUTINE W REFLEX MICROSCOPIC     Status: Abnormal   Collection Time    09/15/13 12:52 AM      Result Value Ref Range  Color, Urine YELLOW  YELLOW   APPearance CLOUDY (*) CLEAR   Specific Gravity, Urine 1.021  1.005 - 1.030   pH 5.5  5.0 - 8.0   Glucose, UA 100 (*) NEGATIVE mg/dL   Hgb urine dipstick SMALL (*) NEGATIVE   Bilirubin Urine NEGATIVE  NEGATIVE   Ketones, ur NEGATIVE  NEGATIVE mg/dL   Protein, ur 30 (*) NEGATIVE mg/dL   Urobilinogen, UA 0.2  0.0 - 1.0 mg/dL   Nitrite NEGATIVE  NEGATIVE   Leukocytes, UA NEGATIVE  NEGATIVE  URINE MICROSCOPIC-ADD ON     Status: Abnormal   Collection Time    09/15/13 12:52 AM       Result Value Ref Range   Squamous Epithelial / LPF FEW (*) RARE   WBC, UA 0-2  <3 WBC/hpf   RBC / HPF 0-2  <3 RBC/hpf   Bacteria, UA FEW (*) RARE   Urine-Other AMORPHOUS URATES/PHOSPHATES    GLUCOSE, CAPILLARY     Status: Abnormal   Collection Time    09/15/13  7:28 AM      Result Value Ref Range   Glucose-Capillary 232 (*) 70 - 99 mg/dL   Comment 1 Notify RN        HENT: dentition poor  Head: Normocephalic.  Eyes: EOM are normal.  Neck: Normal range of motion. Neck supple. No thyromegaly present.  Cardiovascular: Normal rate and regular rhythm. No murmurs  Respiratory: Effort normal and breath sounds normal. No respiratory distress.  GI: Soft. Bowel sounds are normal. He exhibits no distension.  Musculoskeletal:  Left coban dressing limiting ROM at knee, leg appropriately tender.  Shoulder , elbow, wrist And hand ROM normal  Neurological: He is alert and oriented to person, place, and time. A sensory deficit is present.  5/5 strength Bilateral delt, bi, tri, grip,  4/5 Bilateral HF and R KE  3- R ankle DF/PF 2-/5 Toe flexors and ext  LLE can be listed against gravity Absent sensation LT toes of R foot  SKIN: necrotic digits Right foot: 1,2 and 5. Skin very dry otherwise Ext no calf swelling or tenderness  Assessment/Plan: 1. Functional deficits secondary to Left BKA for gangrene (diabetic neuropathy, undiagnosed DM, no medical care) which require 3+ hours per day of interdisciplinary therapy in a comprehensive inpatient rehab setting. Physiatrist is providing close team supervision and 24 hour management of active medical problems listed below. Physiatrist and rehab team continue to assess barriers to discharge/monitor patient progress toward functional and medical goals. FIM:    FIM - Upper Body Dressing/Undressing Upper body dressing/undressing: 0: Wears gown/pajamas-no public clothing FIM - Lower Body Dressing/Undressing Lower body dressing/undressing: 0: Wears  gown/pajamas-no public clothing (patient required assistance to don right sock)  FIM - Toileting Toileting steps completed by patient: Performs perineal hygiene Toileting: 2: Max-Patient completed 1 of 3 steps  FIM - Radio producer Devices: Elevated toilet seat;Grab bars Toilet Transfers: 4-To toilet/BSC: Min A (steadying Pt. > 75%);4-From toilet/BSC: Min A (steadying Pt. > 75%)  FIM - Bed/Chair Transfer Bed/Chair Transfer: 5: Supine > Sit: Supervision (verbal cues/safety issues);4: Bed > Chair or W/C: Min A (steadying Pt. > 75%)  FIM - Locomotion: Wheelchair Distance: 60' Locomotion: Wheelchair: 2: Travels 50 - 149 ft with supervision, cueing or coaxing FIM - Locomotion: Ambulation Ambulation/Gait Assistance: 4: Min assist Locomotion: Ambulation: 1: Travels less than 50 ft with minimal assistance (Pt.>75%)  Comprehension Comprehension Mode: Auditory Comprehension: 7-Follows complex conversation/direction: With no assist  Expression Expression Mode: Verbal  Expression: 7-Expresses complex ideas: With no assist  Social Interaction Social Interaction: 7-Interacts appropriately with others - No medications needed.  Problem Solving Problem Solving: 7-Solves complex problems: Recognizes & self-corrects  Memory Memory: 7-Complete Independence: No helper  Medical Problem List and Plan:  Left BKA, and R necrotic toes  1. DVT Prophylaxis/Anticoagulation: Add SQ lovenox. Check RLE dopplers  2. Pain Management: Prn oxcycodone.  3. Mood: Does not express any signs of distress or concerns. LCSW to follow for evaluation.  4. Neuropsych: This patient is capable of making decisions on his own behalf.  5. DM type 2 poorly controlled: Monitor BS with ac/hs checks. Will change lantus to 70/30 insulin as patient without insurance.titrate up Use SSI for elevated BS and titrate as indicated. Question compliance past discharge.  6. Recurrent fever: Question drug  related as has had low grade fever with spike last pm. Off  cindamycin--qestion need for vancomycin. WBC is normal. Pan culture for Temp > 101. Rule out DVT.  Wound cx plus BC x 2 + for proteus pan sensitive, BC x1 + coag neg staph, repeat BC pnd -continue to closely watch the right foot. This looks unchanged vs baseline    LOS (Days) 2 A FACE TO FACE EVALUATION WAS PERFORMED  Wilmer Santillo E 09/15/2013, 7:48 AM

## 2013-09-16 ENCOUNTER — Encounter (HOSPITAL_COMMUNITY): Payer: Self-pay | Admitting: Occupational Therapy

## 2013-09-16 ENCOUNTER — Inpatient Hospital Stay (HOSPITAL_COMMUNITY): Payer: Medicaid Other | Admitting: Physical Therapy

## 2013-09-16 ENCOUNTER — Inpatient Hospital Stay: Payer: Self-pay

## 2013-09-16 ENCOUNTER — Inpatient Hospital Stay (HOSPITAL_COMMUNITY): Payer: Medicaid Other

## 2013-09-16 DIAGNOSIS — R059 Cough, unspecified: Secondary | ICD-10-CM

## 2013-09-16 DIAGNOSIS — I96 Gangrene, not elsewhere classified: Secondary | ICD-10-CM | POA: Diagnosis present

## 2013-09-16 DIAGNOSIS — R509 Fever, unspecified: Secondary | ICD-10-CM | POA: Diagnosis present

## 2013-09-16 DIAGNOSIS — R05 Cough: Secondary | ICD-10-CM

## 2013-09-16 LAB — URINE CULTURE
Colony Count: NO GROWTH
Culture: NO GROWTH
Special Requests: NORMAL

## 2013-09-16 LAB — CBC WITH DIFFERENTIAL/PLATELET
BASOS ABS: 0 10*3/uL (ref 0.0–0.1)
BASOS PCT: 0 % (ref 0–1)
EOS ABS: 0.1 10*3/uL (ref 0.0–0.7)
Eosinophils Relative: 1 % (ref 0–5)
HCT: 24.5 % — ABNORMAL LOW (ref 39.0–52.0)
HEMOGLOBIN: 8.1 g/dL — AB (ref 13.0–17.0)
Lymphocytes Relative: 21 % (ref 12–46)
Lymphs Abs: 1.6 10*3/uL (ref 0.7–4.0)
MCH: 29.3 pg (ref 26.0–34.0)
MCHC: 33.1 g/dL (ref 30.0–36.0)
MCV: 88.8 fL (ref 78.0–100.0)
MONOS PCT: 8 % (ref 3–12)
Monocytes Absolute: 0.6 10*3/uL (ref 0.1–1.0)
NEUTROS PCT: 71 % (ref 43–77)
Neutro Abs: 5.4 10*3/uL (ref 1.7–7.7)
PLATELETS: 244 10*3/uL (ref 150–400)
RBC: 2.76 MIL/uL — ABNORMAL LOW (ref 4.22–5.81)
RDW: 13.9 % (ref 11.5–15.5)
WBC: 7.7 10*3/uL (ref 4.0–10.5)

## 2013-09-16 LAB — GLUCOSE, CAPILLARY
GLUCOSE-CAPILLARY: 218 mg/dL — AB (ref 70–99)
GLUCOSE-CAPILLARY: 192 mg/dL — AB (ref 70–99)
Glucose-Capillary: 266 mg/dL — ABNORMAL HIGH (ref 70–99)

## 2013-09-16 NOTE — Progress Notes (Signed)
Occupational Therapy Session Note  Patient Details  Name: [name] MRN: [id number] Date of Birth: 08-19-1955  Today's Date: 09/16/2013 Time: 1000-1100 Time Calculation (min): 60 min  Short Term Goals: Week 1:  OT Short Term Goal 1 (Week 1): Short Term Goals = Long Term Goals  Skilled Therapeutic Interventions/Progress Updates:    Pt began session by working on bathing and dressing sit to stand from the wheelchair at the sink.  Pt was able to perform all aspects including standing to wash peri area with min assist.  Provided stool to prop his right foot on for donning and doffing gripper sock and Darko shoe.  When finished pt worked on rolling himself to the gym via the wheelchair with supervision.  He needed to stop X2 secondary to fatigue.  In the gym transferred to the therapy mat and worked on standing balance and endurance by having him stand with his RW while alternating hands to place clothespins on vertical grid.  Pt able to maintain standing balance with close supervision even when performing the task with the LUE.  Pt able to maintain standing for 3 min intervals.  HR increased to 123 and O2 94% on room air.  After two intervals pt reported needing to go to the bathroom.  Took pt to the tub/room for toilet transfer as his own room was unavailable secondary to cleaning the floors.  Pt able to ambulate small distance (approximately 41ft with min assist).  Pt needed min assist for sit to stand from elevated toilet with grab bar.  Pt needed mod instructional cueing for hand placement with sit to stand from the mat and from the wheelchair.    Therapy Documentation Precautions:  Precautions Precautions: Fall Precaution Comments:   Required Braces or Orthoses: Other Brace/Splint Other Brace/Splint: R DARCO shoe when OOB Restrictions Weight Bearing Restrictions: Yes LLE Weight Bearing: Non weight bearing Other Position/Activity Restrictions: keep L knee straight/elevated when in w/c,  bed  Vital Signs: Therapy Vitals Temp: 97.9 F (36.6 C) Temp src: Oral Pulse Rate: 120 Patient Position, if appropriate: Sitting Oxygen Therapy SpO2: 79 % (after stairs; increased to 90% with seated rest break) O2 Device: None (Room air) Pain: Pain Assessment Pain Assessment: No/denies pain ADL: See FIM for current functional status  Therapy/Group: Individual Therapy  Audreyana Huntsberry OTR/L 09/16/2013, 12:14 PM

## 2013-09-16 NOTE — Progress Notes (Signed)
Temp 101.5.  Offered IS to patient again.  Patient took it and did not use it.

## 2013-09-16 NOTE — Progress Notes (Signed)
Occupational Therapy Session Note  Patient Details  Name: [name] MRN: [id number] Date of Birth: [birth year]  Today's Date: 09/16/2013 Time: [id number] Time Calculation (min): 15 min  Short Term Goals: Week 1:  OT Short Term Goal 1 (Week 1): Short Term Goals = Long Term Goals  Skilled Therapeutic Interventions/Progress Updates:    Pt seen for 1:1 OT session with focus on bed mobility, sit<>stand, and functional transfers. Pt received supine in bed willing to participate in therapy with min cues of encouragement. RN had just taken vitals and pt with temperature 100.9. Pt completed supine>sit with supervision and slightly SOB. Discussed breathing techniques. Completed stand pivot transfer with min assist and min cues as pt attempting to sit when w/c too far behind him. Completed sit<>stand with min assist for therapist to place stump board for LLE. Pt required rest breaks between d/t SOB. Wound MD entered room therefore pt missing 15 min OT.   Therapy Documentation Precautions:  Precautions Precautions: Fall Precaution Comments:   Required Braces or Orthoses: Other Brace/Splint Other Brace/Splint: R DARCO shoe when OOB Restrictions Weight Bearing Restrictions: Yes LLE Weight Bearing: Non weight bearing Other Position/Activity Restrictions: keep L knee straight/elevated when in w/c, bed General: General Amount of Missed OT Time (min): 15 Minutes Vital Signs: Therapy Vitals Temp: 100.9 F (38.3 C) Temp src: Oral Pulse Rate: 112 BP: 133/79 mmHg Patient Position, if appropriate: Lying Oxygen Therapy SpO2: 79 % (after stairs; increased to 90% with seated rest break) O2 Device: None (Room air) Pain: Pain Assessment Pain Assessment: No/denies pain  Other Treatments:    See FIM for current functional status  Therapy/Group: Individual Therapy  Duayne Cal 09/16/2013, 1:44 PM

## 2013-09-16 NOTE — Progress Notes (Signed)
Physical Therapy Session Note  Patient Details  Name: [name] MRN: [id number] Date of Birth: 07-07-1956  Today's Date: 09/16/2013 Time: 0900-0959 and 1100-1127 Time Calculation (min): 59 min and 27 min  Short Term Goals: No short term goals set  Skilled Therapeutic Interventions/Progress Updates:   Pt reporting getting very sweaty last night with fever.  No fever currently.  Pt transferred to EOB with HOB elevated, bed rail and supervision.  EOB donned clean gowns and transferred bed > w/c squat pivot with min A and no verbal cues for safety or sequencing.  Sp02 in w/c on room air 96%, HR: 109 bpm at rest.  While pt performing w/c mobility to gym with bilat UE x 100' discussed with pt PLOF, home set up at sister's house, D/C plan (since no extra bed at sister's or brother's house), supervision available at D/C, and equipment needs and use of w/c for safety and energy conservation.  Pt reports that a w/c will not fit in his sister's or his brother's house.  Pt currently does not know where he will sleep and mentioned returning to his car; discussed risks of falling or hypothermia/frostbite.  Also discussed falls risk of going up/down stairs multiple times a day without UE support.  Also discussed needing w/c for community distances for safety and energy conservation.  In gym performed gait x 15' with RW and mod A secondary to decreased balance and forward LOB from [name]; pt fatigued quickly and required sitting rest break for breathing and to assess HR.  Returned to gym and performed up/down 3 short stairs with 2 rails and mod A.  Pt very fatigued at end of stairs and required increased time to bring Sp02 >90%; HR 120 bpm.  Discussed pt dependence on UE for stair negotiation and lack of rails at sister's house as falls risk.  Will continue to discuss with family and pt.  During seated rest breaks also performed 10 reps each L knee LAQ and hamstring curls and quad sets with limb supported.  Also  discussed importance of positioning while in w/c or bed to prevent flexion contracture.  Pt verbalized understanding.  Returned to room for bathing and dressing.  2nd session: Pt in bed awaiting IV team to place new IV for antibiotics.  Reporting arm rest on current chair rubbing drive wheel causing increased friction and decreased efficiency with UE propulsion.  Changed out w/c to different manual w/c with same dimensions and leg rests.  Demonstrated new arm rest and brake sequence to pt.  Pt performed supine > sit EOB with bed flat and no rails with supervision.  Performed bed > w/c with DARCO shoe and armrest with squat pivots min A.  Pt performed w/c parts management training and w/c mobility x 200' with supervision and reporting increased ease of propulsion with new chair and improved efficiency.  Returned to room; IV team present.  Returned to bed squat pivot to L supervision and sit > supine supervision with set up assistance for w/c.  Will attempt to complete LE strengthening this pm.   Therapy Documentation Precautions:  Precautions Precautions: Fall Required Braces or Orthoses: Other Brace/Splint Other Brace/Splint: R DARCO shoe when OOB Restrictions Weight Bearing Restrictions: Yes LLE Weight Bearing: Non weight bearing Other Position/Activity Restrictions: keep L knee straight/elevated when in w/c, bed Vital Signs: Therapy Vitals Temp: 97.9 F (36.6 C) Temp src: Oral Pulse Rate: 120 Patient Position, if appropriate: Sitting Oxygen Therapy SpO2: 79 % (after stairs; increased to 90%  with seated rest break) O2 Device: None (Room air) Pain: Pain Assessment Pain Assessment: No/denies pain Locomotion : Ambulation Ambulation/Gait Assistance: 3: Mod assist Wheelchair Mobility Distance: 100   See FIM for current functional status  Therapy/Group: Individual Therapy  [name] [facility] 09/16/2013, 10:25 AM

## 2013-09-16 NOTE — Progress Notes (Signed)
Patient c/o "feeling hot".  Temp 102.7,HR 117, O2 SAT 82% on room air.  Offered incentive spirometer to patient and placed O2 @ 3 L/M on patient.  Temp now 101.9, O2 SAT now 96%.  Patient received Tylenol 650 mg po due to c/o discomfort.  Will continue to monitor patient.

## 2013-09-16 NOTE — Care Management Note (Signed)
Inpatient [facility] Individual Statement of Services  Patient Name:  [name]  Date:  09/16/2013  Welcome to the [facility].  Our goal is to provide you with an individualized program based on your diagnosis and situation, designed to meet your specific needs.  With this comprehensive rehabilitation program, you will be expected to participate in at least 3 hours of rehabilitation therapies Monday-Friday, with modified therapy programming on the weekends.  Your rehabilitation program will include the following services:  Physical Therapy (PT), Occupational Therapy (OT), 24 hour per day rehabilitation nursing, Case Management (Social Worker), Rehabilitation Medicine, Nutrition Services and Pharmacy Services  Weekly team conferences will be held on Wednesday to discuss your progress.  Your Social Worker will talk with you frequently to get your input and to update you on team discussions.  Team conferences with you and your family in attendance may also be held.  Expected length of stay: 7-10 days Overall anticipated outcome:mod/i level  Depending on your progress and recovery, your program may change. Your Social Worker will coordinate services and will keep you informed of any changes. Your Social Worker's name and contact numbers are listed  below.  The following services may also be recommended but are not provided by the [facility] will be made to provide these services after discharge if needed.  Arrangements include referral to agencies that provide these services.  Your insurance has been verified to be:  None Your primary doctor is:  None  Pertinent information will be shared with your doctor and your insurance company.  Social Worker:  Ovidio Kin, [place] or ([phone]  Information discussed with  and copy given to patient by: Elease Hashimoto, 09/16/2013, 10:02 AM

## 2013-09-16 NOTE — Progress Notes (Signed)
New IV start by IV team with doppler used. Began [place] with pt complaining of burning and pain at site. Site swollen and tender to touch.  Stopped fluids, call IV team. Not able to start new IV due to doppler first used with 2 attempts. Suggested PICC or central line. Notified Algis Liming, PA with issue.

## 2013-09-16 NOTE — Consult Note (Signed)
[facility] for Infectious Disease    Date of Admission:  09/13/2013           Day 12 vancomycin        Day 12 piperacillin tazobactam       Reason for Consult: Unexplained fever    Referring Physician: Dr. Leroy Sea Primary Care Physician: None  Principal Problem:   Fever with chills Active Problems:   DM (diabetes mellitus) type II controlled, neurological manifestation   Diabetic foot infection   DKA (diabetic ketoacidoses)   Unilateral complete BKA   Gangrene of toe   . aspirin EC  325 mg Oral Daily  . enoxaparin (LOVENOX) injection  40 mg Subcutaneous Q24H  . feeding supplement (GLUCERNA SHAKE)  237 mL Oral Q24H  . insulin aspart  0-20 Units Subcutaneous TID WC  . insulin aspart  0-5 Units Subcutaneous QHS  . insulin aspart protamine- aspart  12 Units Subcutaneous BID WC  . multivitamin with minerals  1 tablet Oral Daily  . piperacillin-tazobactam (ZOSYN)  IV  3.375 g Intravenous Q8H  . vancomycin  1,000 mg Intravenous Q12H    Recommendations: 1. Discontinue vancomycin and piperacillin tazobactam and observe off of antibiotics for now   Assessment: The source for his recent fevers is unclear. He does have some dry cough and a few basilar crackles but I really don't believe that he has healthcare associated pneumonia. His chest x-ray appears under aerated with some atelectasis. I doubt that his ischemic right toes are infected or causing fever. I favor stopping antibiotics for now and observe him closely. I will followup tomorrow.    HPI: [name] is a 58 y.o. male who is homeless. He recently developed numbness in his feet, unintentional weight loss, blurred vision followed by sudden increase in pain in his left foot. He was admitted on February 2 with newly diagnosed diabetes mellitus, DKA and severe, deep left foot infection. He underwent incision and drainage on February 3 with fifth metatarsal ray amputation and fourth toe amputation.  Operative Gram stain showed gram-positive cocci and culture grew Proteus sensitive to all antibiotics tested. He has been treated with vancomycin and piperacillin tazobactam. His foot infection progressed and he underwent left BKA on February 11.  He began to have fever, chills and sweats 4 days ago. He has not had any obvious problem with his IV sites until today when his IV infiltrated. He has some mild dry cough. He has had some soft stools recently but no diarrhea. He denies any dysuria. He does not have any rash or itching.   Review of Systems: Constitutional: positive for chills, fevers, sweats and weight loss, negative for anorexia Eyes: positive for recent blurred vision Ears, nose, mouth, throat, and face: negative Respiratory: positive for cough and some dyspnea when working with his physical therapist, negative for pleurisy/chest pain, sputum and wheezing Cardiovascular: negative Gastrointestinal: negative Genitourinary:negative Integument/breast: negative  Past Medical History  Diagnosis Date  . Hepatitis   . Hypertension   . Pneumonia     HX OF PNA  . Diabetes mellitus without complication     PATIENT JUST LEARNED HE WAS DIABETIC  . Necrotizing fasciitis   . Hyponatremia   . Hypokalemia     History  Substance Use Topics  . Smoking status: Never Smoker   . Smokeless tobacco: Never Used  . Alcohol Use: No    Family History  Problem Relation Age of Onset  . Diabetes  type II Mother   . Dementia Mother   . Heart disease Father    No Known Allergies  OBJECTIVE: Blood pressure 133/79, pulse 112, temperature 100.9 F (38.3 C), temperature source Oral, resp. rate 17, height 5\' 3"  (1.6 m), weight 67.677 kg (149 lb 3.2 oz), SpO2 79.00%. General: He is in good spirits he did in his wheelchair Skin: Current and former IV sites without evidence of infection. No rash Lungs: Few basilar crackles right greater than left Cor: Regular S1-S2 with no murmurs Abdomen: Soft  and nontender Joints and extremities: Left BKA incision just inspected by nurse reports that it is clean without evidence of infection. He has dry gangrene of his right first, second and fifth toes. There is cracked skin on the plantar surface of his right great toe underneath the MTP joint. There is no drainage, odor or fluctuance.  Lab Results Lab Results  Component Value Date   WBC 7.7 09/16/2013   HGB 8.1* 09/16/2013   HCT 24.5* 09/16/2013   MCV 88.8 09/16/2013   PLT 244 09/16/2013    Lab Results  Component Value Date   CREATININE 1.05 09/14/2013   BUN 10 09/14/2013   NA 141 09/14/2013   K 3.5* 09/14/2013   CL 102 09/14/2013   CO2 26 09/14/2013    Lab Results  Component Value Date   ALT 11 09/14/2013   AST 16 09/14/2013   ALKPHOS 74 09/14/2013   BILITOT 0.3 09/14/2013     Microbiology: Recent Results (from the past 240 hour(s))  SURGICAL PCR SCREEN     Status: None   Collection Time    09/11/13  9:38 AM      Result Value Ref Range Status   MRSA, PCR NEGATIVE  NEGATIVE Final   Staphylococcus aureus NEGATIVE  NEGATIVE Final   Comment:            The Xpert SA Assay (FDA     approved for NASAL specimens     in patients over 33 years of age),     is one component of     a comprehensive surveillance     program.  Test performance has     been validated by [employer] for patients greater     than or equal to 86 year old.     It is not intended     to diagnose infection nor to     guide or monitor treatment.  URINE CULTURE     Status: None   Collection Time    09/15/13 12:52 AM      Result Value Ref Range Status   Specimen Description URINE, RANDOM   Final   Special Requests Zozyn and Vancomycin Normal   Final   Culture  Setup Time     Final   Value: 09/15/2013 14:54     Performed at [facility]     Final   Value: NO GROWTH     Performed at [facility]   Culture     Final   Value: NO GROWTH     Performed at [facility]    Report Status 09/16/2013 FINAL   Final  CULTURE, BLOOD (ROUTINE X 2)     Status: None   Collection Time    09/15/13  1:55 AM      Result Value Ref Range Status   Specimen Description BLOOD RIGHT ANTECUBITAL   Final   Special Requests  BOTTLES DRAWN AEROBIC AND ANAEROBIC 10CC   Final   Culture  Setup Time     Final   Value: 09/15/2013 14:36     Performed at [facility]   Culture     Final   Value:        BLOOD CULTURE RECEIVED NO GROWTH TO DATE CULTURE WILL BE HELD FOR 5 DAYS BEFORE ISSUING A FINAL NEGATIVE REPORT     Performed at [facility]   Report Status PENDING   Incomplete  CULTURE, BLOOD (ROUTINE X 2)     Status: None   Collection Time    09/15/13  2:00 AM      Result Value Ref Range Status   Specimen Description BLOOD RIGHT HAND   Final   Special Requests BOTTLES DRAWN AEROBIC AND ANAEROBIC 10CC   Final   Culture  Setup Time     Final   Value: 09/15/2013 14:36     Performed at [facility]   Culture     Final   Value:        BLOOD CULTURE RECEIVED NO GROWTH TO DATE CULTURE WILL BE HELD FOR 5 DAYS BEFORE ISSUING A FINAL NEGATIVE REPORT     Performed at [facility]   Report Status PENDING   Incomplete    Michel Bickers, MD [facility] [phone] pager   [phone] cell 09/16/2013, 2:19 PM

## 2013-09-16 NOTE — Progress Notes (Signed)
Left BKA incision checked and is c/d/i.  No signs of infection. Dressing reapplied. Plan to keep sutures in for 3 weeks post surgery Will follow periodically for gangrenous toes  N. Eduard Roux, MD [place] [facility] 4:36 PM

## 2013-09-16 NOTE — Progress Notes (Signed)
Social Work Assessment and Plan Social Work Assessment and Plan  Patient Details  Name: [name] MRN: [id number] Date of Birth: Jun 06, 1956  Today's Date: 09/16/2013  Problem List:  Patient Active Problem List   Diagnosis Date Noted  . Unilateral complete BKA 09/13/2013  . DKA (diabetic ketoacidoses) 09/04/2013  . Cellulitis and abscess of foot 09/02/2013  . Abscess of foot 09/01/2013  . DM (diabetes mellitus) type II controlled, neurological manifestation 09/01/2013   Past Medical History:  Past Medical History  Diagnosis Date  . Hepatitis   . Hypertension   . Pneumonia     HX OF PNA  . Diabetes mellitus without complication     PATIENT JUST LEARNED HE WAS DIABETIC  . Necrotizing fasciitis   . Hyponatremia   . Hypokalemia    Past Surgical History:  Past Surgical History  Procedure Laterality Date  . Cholecystectomy    . I&d extremity Left 09/03/2013    Procedure: IRRIGATION AND DEBRIDEMENT EXTREMITY;  Surgeon: Marianna Payment, MD;  Location: [facility];  Service: Orthopedics;  Laterality: Left;  . I&d extremity Left 09/11/2013    Procedure: LEFT FOOT IRRIGATION AND DEBRIDEMENT;  Surgeon: Marianna Payment, MD;  Location: [facility];  Service: Orthopedics;  Laterality: Left;  . Amputation Left 09/11/2013    Procedure: AMPUTATION BELOW KNEE;  Surgeon: Marianna Payment, MD;  Location: [facility];  Service: Orthopedics;  Laterality: Left;   Social History:  reports that he has never smoked. He has never used smokeless tobacco. He reports that he does not drink alcohol or use illicit drugs.  Family / Support Systems Marital Status: Single Patient Roles: Other (Comment) (Sibling) Other Supports: [name]  [phone] Anticipated Caregiver: Sister and brother, live next door to one another Ability/Limitations of Caregiver: Both work  Careers adviser: Evenings only Family Dynamics: Pt is close to his siblings and can rely upon them when he needs them.  He has been  offered to stay at both homes, but stays in his vehicle at night parked in sister's driveway.  He wants to remain independent and not burden either of them.  Social History Preferred language: English Religion:  Cultural Background: No issues Education: High School Read: Yes Write: Yes Employment Status: Unemployed Freight forwarder Issues: No issues Guardian/Conservator: None-according to MD pt is capable of making his own decisions while here.   Abuse/Neglect Physical Abuse: Denies Verbal Abuse: Denies Sexual Abuse: Denies Exploitation of patient/patient's resources: Denies Self-Neglect: Denies  Emotional Status Pt's affect, behavior adn adjustment status: Pt is taking each day at a time.  He wants to see how much mobility he has and how independent he can get before he leaves here.  Both sister and brother have offered for him to come to their homes at discharge.  He is positive and willing to work hard in therapies to regain his independence. Recent Psychosocial Issues: Other medical issues-new diabetic, medical follow up and housing needs. Pyschiatric History: No history-deferred depression screen due to tired from therapies and feels he is doing well and it is not needed.  He relies upon himself and will infomr me if more is needed.  He is pleased with the progress he is making already and is encouraged. Substance Abuse History: No issues  Patient / Family Perceptions, Expectations & Goals Pt/Family understanding of illness & functional limitations: Pt and sister have a good understanding of his condition and surgery.  Both are of the mindset that it had to be done and their  was no choice about it.  Pt is having a good day and feels good about therapies this am.  He did not realize how weak he was and needs to get his strength back.  Sister is willing to assist  with what she can do for him. Premorbid pt/family roles/activities: Brother, Paramedic at [employer],  etc Anticipated changes in roles/activities/participation: resume at discharge once healed Pt/family expectations/goals: Pt states: " I want to see how well I do here, before I decide anything."  Sister states: " I hope he does well here and gets moving."  [employer]: None Premorbid Home Care/DME Agencies: None Transportation available at discharge: Family, pt drove PTA Resource referrals recommended: Support group (specify) (Amputee Support Group)  Discharge Planning Living Arrangements: Alone Support Systems: Other relatives Type of Residence: Other (Comment) (His car) Insurance Resources: Teacher, adult education Resources: Other (Comment) Patent attorney) Financial Screen Referred: Yes Living Expenses: Other (Comment) (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  Discussed returning to his car and the importance of hygiene for his wound healing and keeping warm during the winter.  he has been told by [employer] staff and his siblings, but has to make his own mind up about this.  Will continue to work on this while he is here. Social Work Anticipated Follow Up Needs: HH/OP;Support Group;Other (comment) (Making sure he has adequate shelter before he is discharged.)  Clinical Impression Pleasant gentleman who is willing to work and regain his independence.  Will work on a safe discharge plan and work along with his siblings on this. Pt has a mind of his own and does what he feels his best for himself, even if it may not seem like this to others.  Aware of the importance of wound healing and  Getting medical follow up as an outpatient.  [facility] to see today in room to begin  process of connecting him with medical resources as an outpatient.  [name] 09/16/2013, 11:35 AM

## 2013-09-16 NOTE — Progress Notes (Signed)
Physical Therapy Session Note  Patient Details  Name: [name] MRN: [id number] Date of Birth: 04-04-1956  Today's Date: 09/16/2013 Time: [id number] Time Calculation (min): 16 min  Skilled Therapeutic Interventions/Progress Updates:    Patient received sitting in wheelchair. Session focused on transfer wheelchair>bed, education/implementation of desensitization strategies, proper positioning in bed, and LE strengthening. Squat pivot wheelchair>bed with supervision, sit>supine with supervision. Education/implementation of desensitization strateiges for phantom limb pain, discussed proper positioning in bed (maintaining limb in extension, abduction, ER; no pillows, etc.). LE strengthening in supine: R ankle pumps, SLR, hip abd x5 each (secondary to fatigue). Patient left supine in bed with bed alarm on and all needs within reach.  Therapy Documentation Precautions:  Precautions Precautions: Fall Precaution Comments:   Required Braces or Orthoses: Other Brace/Splint Other Brace/Splint: R DARCO shoe when OOB Restrictions Weight Bearing Restrictions: Yes LLE Weight Bearing: Non weight bearing Other Position/Activity Restrictions: keep L knee straight/elevated when in w/c, bed Pain: Pain Assessment Pain Assessment: No/denies pain Pain Score: 0-No pain Locomotion : Ambulation Ambulation/Gait Assistance: Not tested (comment)   See FIM for current functional status  Therapy/Group: Individual Therapy  Lillia Abed. Rishon Thilges, PT, DPT 09/16/2013, 3:54 PM

## 2013-09-16 NOTE — Progress Notes (Signed)
Patient information reviewed and entered into e[facility] system by Ezmeralda Stefanick, RN, CRRN, PPS Coordinator.  Information including medical coding and functional independence measure will be reviewed and updated through discharge.    

## 2013-09-17 ENCOUNTER — Inpatient Hospital Stay (HOSPITAL_COMMUNITY): Payer: Medicaid Other | Admitting: Physical Therapy

## 2013-09-17 ENCOUNTER — Inpatient Hospital Stay (HOSPITAL_COMMUNITY): Payer: Self-pay | Admitting: Occupational Therapy

## 2013-09-17 ENCOUNTER — Inpatient Hospital Stay (HOSPITAL_COMMUNITY): Payer: Medicaid Other | Admitting: Occupational Therapy

## 2013-09-17 LAB — GLUCOSE, CAPILLARY
GLUCOSE-CAPILLARY: 181 mg/dL — AB (ref 70–99)
GLUCOSE-CAPILLARY: 189 mg/dL — AB (ref 70–99)
Glucose-Capillary: 142 mg/dL — ABNORMAL HIGH (ref 70–99)
Glucose-Capillary: 166 mg/dL — ABNORMAL HIGH (ref 70–99)
Glucose-Capillary: 169 mg/dL — ABNORMAL HIGH (ref 70–99)

## 2013-09-17 NOTE — Progress Notes (Signed)
Occupational Therapy Session Note  Patient Details  Name: [name] MRN: [id number] Date of Birth: 1956/03/04  Today's Date: 09/17/2013 Time: 1030-1130 Time Calculation (min): 60 min  Short Term Goals: Week 1:  OT Short Term Goal 1 (Week 1): Short Term Goals = Long Term Goals  Skilled Therapeutic Interventions/Progress Updates:    Engaged in ADL retraining with focus on sit > stand and standing balance with bathing and dressing.  Pt completed bathing at sink with close supervision when standing for LB bathing.  Pt propelled himself to gym via w/c with supervision with no rest breaks this session.  In the gym transferred to therapy mat and worked on standing balance and endurance by having him stand with his RW while alternating hands to simulate pulling up pants.  Used theraband to have pt thread pants and pull over hips x3. Pt required min assist initially, followed by close supervision with standing when pulling theraband over hips.  Transferred back to w/c with RW with close supervision with 1 LOB requiring min assist to stabilize. Pt returned to bed at end of session with squat pivot close supervision.  Therapy Documentation Precautions:  Precautions Precautions: Fall Precaution Comments:   Required Braces or Orthoses: Other Brace/Splint Other Brace/Splint: R DARCO shoe when OOB Restrictions Weight Bearing Restrictions: Yes LLE Weight Bearing: Non weight bearing Other Position/Activity Restrictions: keep L knee straight/elevated when in w/c, bed General:   Vital Signs: Therapy Vitals Temp: 98.3 F (36.8 C) Temp src: Oral Pulse Rate: 107 BP: 132/79 mmHg Patient Position, if appropriate: Sitting Oxygen Therapy SpO2: 90 % O2 Device: None (Room air) Pain: Pain Assessment Pain Assessment: No/denies pain  See FIM for current functional status  Therapy/Group: Individual Therapy  [name] 09/17/2013, 12:28 PM

## 2013-09-17 NOTE — Progress Notes (Signed)
Subjective/Complaints: 58 y.o. male with history of diabetes mellitus, HTN, h/o hepatitis, BLE numbness since frostbite last year but no medical care. He has been living in his car for past 4-5 years (had declined opportunity to live with family) who was brought to ED on 09/01/13 by family due to inability to care for self, confusion as well as left foot infection. X rays with extensive soft tissue gas consistent with cellulitis and abscess formation. He was found to have diabetic ketoacidosis and treated with insulin infusion. He was evaluated by Dr. Erlinda Hong and placed on IV antibiotics. His foot culture positive for proteus mirabilis--pan sensitive. MRI with for concern for necrotizing fascitis and patient taken to OR for I & D with 4 th and 5th toe amputation with placement of VAC. Also noted to have necrotic 1st, 2nd and 5 th toes on the right and likey allow auto amputation per ortho. 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 rate 17, height 5\' 3"  (1.6 m), weight 67.677 kg (149 lb 3.2 oz), SpO2 91.00%. No results found. Results for orders placed during the hospital encounter of 09/13/13 (from the past 72 hour(s))  VANCOMYCIN, TROUGH     Status: None   Collection Time    09/14/13 10:00 AM      Result Value Ref Range   Vancomycin Tr 16.1  10.0 - 20.0 ug/mL  GLUCOSE, CAPILLARY     Status: Abnormal   Collection Time    09/14/13 12:15 PM      Result Value Ref Range   Glucose-Capillary 204 (*) 70 - 99 mg/dL  GLUCOSE, CAPILLARY     Status: Abnormal   Collection Time    09/14/13  4:12 PM      Result Value Ref Range   Glucose-Capillary 190 (*) 70 - 99 mg/dL  GLUCOSE, CAPILLARY     Status: Abnormal    Collection Time    09/14/13  9:27 PM      Result Value Ref Range   Glucose-Capillary 218 (*) 70 - 99 mg/dL   Comment 1 Notify RN    CBC     Status: Abnormal   Collection Time    09/15/13 12:30 AM      Result Value Ref Range   WBC 8.8  4.0 - 10.5 K/uL   RBC 2.89 (*) 4.22 - 5.81 MIL/uL   Hemoglobin 8.4 (*) 13.0 - 17.0 g/dL   HCT 25.2 (*) 39.0 - 52.0 %   MCV 87.2  78.0 - 100.0 fL   MCH 29.1  26.0 - 34.0 pg   MCHC 33.3  30.0 - 36.0 g/dL   RDW 13.4  11.5 - 15.5 %   Platelets 268  150 - 400 K/uL  URINALYSIS, ROUTINE W REFLEX MICROSCOPIC     Status: Abnormal   Collection Time    09/15/13 12:52 AM      Result Value Ref Range   Color, Urine YELLOW  YELLOW   APPearance CLOUDY (*) CLEAR   Specific Gravity, Urine 1.021  1.005 - 1.030   pH 5.5  5.0 - 8.0   Glucose, UA 100 (*) NEGATIVE mg/dL   Hgb urine dipstick SMALL (*) NEGATIVE   Bilirubin Urine NEGATIVE  NEGATIVE   Ketones, ur NEGATIVE  NEGATIVE mg/dL   Protein, ur 30 (*) NEGATIVE mg/dL   Urobilinogen, UA 0.2  0.0 - 1.0 mg/dL   Nitrite NEGATIVE  NEGATIVE   Leukocytes, UA NEGATIVE  NEGATIVE  URINE CULTURE     Status: None   Collection Time    09/15/13 12:52 AM      Result Value Ref Range   Specimen Description URINE, RANDOM     Special Requests Zozyn and Vancomycin Normal     Culture  Setup Time       Value: 09/15/2013 14:54     Performed at [place]       Value: NO GROWTH     Performed at [facility]   Culture       Value: NO GROWTH     Performed at [facility]   Report Status 09/16/2013 FINAL    URINE MICROSCOPIC-ADD ON     Status: Abnormal   Collection Time    09/15/13 12:52 AM      Result Value Ref Range   Squamous Epithelial / LPF FEW (*) RARE   WBC, UA 0-2  <3 WBC/hpf   RBC / HPF 0-2  <3 RBC/hpf   Bacteria, UA FEW (*) RARE   Urine-Other AMORPHOUS URATES/PHOSPHATES    CULTURE, BLOOD (ROUTINE X 2)     Status: None   Collection Time    09/15/13  1:55 AM      Result  Value Ref Range   Specimen Description BLOOD RIGHT ANTECUBITAL     Special Requests BOTTLES DRAWN AEROBIC AND ANAEROBIC 10CC     Culture  Setup Time       Value: 09/15/2013 14:36     Performed at [facility]   Culture       Value:        BLOOD CULTURE RECEIVED NO GROWTH TO DATE CULTURE WILL BE HELD FOR 5 DAYS BEFORE ISSUING A FINAL NEGATIVE REPORT     Performed at [facility]   Report Status PENDING    CULTURE, BLOOD (ROUTINE X 2)     Status: None   Collection Time    09/15/13  2:00 AM      Result Value Ref Range   Specimen Description BLOOD RIGHT HAND     Special Requests BOTTLES DRAWN AEROBIC AND ANAEROBIC 10CC     Culture  Setup Time       Value: 09/15/2013 14:36     Performed at [facility]   Culture       Value:        BLOOD CULTURE RECEIVED NO GROWTH TO DATE CULTURE WILL BE HELD FOR 5 DAYS BEFORE ISSUING A FINAL NEGATIVE REPORT     Performed at [facility]   Report Status PENDING    GLUCOSE, CAPILLARY     Status: Abnormal   Collection Time    09/15/13  7:28 AM      Result Value Ref Range   Glucose-Capillary 232 (*) 70 - 99 mg/dL   Comment 1 Notify RN    GLUCOSE, CAPILLARY     Status: Abnormal   Collection Time    09/15/13 11:51 AM      Result Value Ref Range   Glucose-Capillary 259 (*) 70 - 99 mg/dL   Comment 1 Notify RN    GLUCOSE, CAPILLARY     Status: Abnormal   Collection Time    09/15/13  4:59 PM      Result Value Ref Range   Glucose-Capillary 238 (*) 70 - 99 mg/dL   Comment 1 Notify RN    GLUCOSE, CAPILLARY     Status: Abnormal   Collection Time    09/15/13  8:29 PM      Result Value Ref Range   Glucose-Capillary 217 (*) 70 - 99 mg/dL  GLUCOSE, CAPILLARY     Status: Abnormal   Collection Time    09/16/13  7:25 AM      Result Value Ref Range   Glucose-Capillary 218 (*) 70 - 99 mg/dL   Comment 1 Notify RN    CBC WITH DIFFERENTIAL     Status: Abnormal   Collection Time    09/16/13  8:04 AM      Result Value Ref Range    WBC 7.7  4.0 - 10.5 K/uL   RBC 2.76 (*) 4.22 - 5.81 MIL/uL   Hemoglobin 8.1 (*) 13.0 - 17.0 g/dL   HCT 24.5 (*) 39.0 - 52.0 %   MCV 88.8  78.0 - 100.0 fL   MCH 29.3  26.0 - 34.0 pg   MCHC 33.1  30.0 - 36.0 g/dL   RDW 13.9  11.5 - 15.5 %   Platelets 244  150 - 400 K/uL   Neutrophils Relative % 71  43 - 77 %   Neutro Abs 5.4  1.7 - 7.7 K/uL   Lymphocytes Relative 21  12 - 46 %   Lymphs Abs 1.6  0.7 - 4.0 K/uL   Monocytes Relative 8  3 - 12 %   Monocytes Absolute 0.6  0.1 - 1.0 K/uL   Eosinophils Relative 1  0 - 5 %   Eosinophils Absolute 0.1  0.0 - 0.7 K/uL   Basophils Relative 0  0 - 1 %   Basophils Absolute 0.0  0.0 - 0.1 K/uL  GLUCOSE, CAPILLARY     Status: Abnormal   Collection Time    09/16/13 11:24 AM      Result Value Ref Range   Glucose-Capillary 192 (*) 70 - 99 mg/dL  GLUCOSE, CAPILLARY     Status: Abnormal   Collection Time    09/16/13  4:59 PM      Result Value Ref Range   Glucose-Capillary 266 (*) 70 - 99 mg/dL   Comment 1 Notify RN    GLUCOSE, CAPILLARY     Status: Abnormal   Collection Time    09/16/13  9:10 PM      Result Value Ref Range   Glucose-Capillary 142 (*) 70 - 99 mg/dL  GLUCOSE, CAPILLARY     Status: Abnormal   Collection Time    09/17/13  7:24 AM      Result Value Ref Range   Glucose-Capillary 181 (*) 70 - 99 mg/dL   Comment 1 Notify RN        HENT: dentition poor  Head: Normocephalic.  Eyes: EOM are normal.  Neck: Normal range of motion. Neck supple. No thyromegaly present.  Cardiovascular: Normal rate and regular rhythm. No murmurs  Respiratory: Effort normal and breath sounds normal. No respiratory distress.  GI: Soft. Bowel sounds are normal. He exhibits no distension.  Musculoskeletal:   Shoulder , elbow, wrist And hand ROM normal  Neurological: He is alert and oriented to person, place, and time. A sensory deficit is present.  5/5 strength Bilateral delt, bi, tri, grip,  4/5 Bilateral HF and R KE  3- R ankle DF/PF  LLE hip  anti gravity   Ext no calf swelling or tenderness  Assessment/Plan: 1. Functional deficits secondary to Left BKA for gangrene (diabetic neuropathy, undiagnosed DM, no medical care) which require 3+ hours per day of interdisciplinary therapy in a comprehensive inpatient rehab setting. Physiatrist is providing close team supervision and 24 hour management of active medical problems listed below. Physiatrist and rehab team continue to assess barriers to discharge/monitor patient progress toward functional and medical goals. FIM: FIM - Bathing Bathing Steps Patient Completed: Chest;Right Arm;Left Arm;Abdomen;Front perineal area;Buttocks;Right upper leg;Left lower leg (including foot);Right lower leg (including foot);Left upper leg Bathing: 4: Min-Patient completes 8-9 22f 10 parts or 75+ percent  FIM - Upper Body Dressing/Undressing Upper body dressing/undressing: 0: Wears gown/pajamas-no public clothing FIM - Lower Body Dressing/Undressing Lower body dressing/undressing steps patient completed: Don/Doff right sock;Don/Doff right shoe;Fasten/unfasten right shoe Lower body dressing/undressing: 5: Supervision: Safety issues/verbal cues  FIM - Toileting Toileting steps completed by patient: Performs perineal hygiene Toileting: 3: Mod-Patient completed 2 of 3 steps  FIM - Radio producer Devices: Elevated toilet seat;Grab bars;Walker Toilet Transfers: 4-To toilet/BSC: Min A (steadying Pt. > 75%);4-From toilet/BSC: Min A (steadying Pt. > 75%)  FIM - Bed/Chair Transfer Bed/Chair Transfer Assistive Devices: Arm rests Bed/Chair Transfer: 4: Chair or W/C > Bed: Min A (steadying Pt. > 75%);5: Sit > Supine: Supervision (verbal cues/safety issues)  FIM - Locomotion: Wheelchair Distance: 100 Locomotion: Wheelchair: 0: Activity did not occur FIM - Locomotion: Ambulation Locomotion: Ambulation Assistive Devices: Administrator Ambulation/Gait Assistance: Not tested  (comment) Locomotion: Ambulation: 0: Activity did not occur  Comprehension Comprehension Mode: Auditory Comprehension: 6-Follows complex conversation/direction: With extra time/assistive device  Expression Expression Mode: Verbal Expression: 6-Expresses complex ideas: With extra time/assistive device  Social Interaction Social Interaction: 6-Interacts appropriately with others with medication or extra time (anti-anxiety, antidepressant).  Problem Solving Problem Solving: 6-Solves complex problems: With extra time  Memory Memory: 6-More than reasonable amt of time  Medical Problem List and Plan:  Left BKA, and R necrotic toes  1. DVT Prophylaxis/Anticoagulation: Add SQ lovenox. Check RLE dopplers  2. Pain Management: Prn oxcycodone.  3. Mood: Does not express any signs of distress or concerns. LCSW to follow for evaluation.  4. Neuropsych: This patient is capable of making decisions on his own behalf.  5. DM type 2 poorly controlled: Monitor BS with ac/hs checks. Will change lantus to 70/30 insulin as patient without insurance.titrate up Use SSI for elevated BS and titrate as indicated. Question compliance past discharge.  6. Recurrent fever: Question drug related as has had low grade fever with spike last pm. Off  abx per ID,Wound cx plus BC x 2 + for proteus pan sensitive, BC x1 + coag neg staph, repeat BC neg, per ortho wound ok -continue to closely watch the right foot. This looks unchanged vs baseline    LOS (Days) 4 A FACE TO FACE EVALUATION WAS PERFORMED  [name] E 09/17/2013, 9:08 AM

## 2013-09-17 NOTE — Progress Notes (Signed)
Patient ID: [name], male   DOB: 1956-01-07, 58 y.o.   MRN: [id number]         [facility]    Date of Admission:  09/13/2013       Principal Problem:   Fever with chills Active Problems:   DM (diabetes mellitus) type II controlled, neurological manifestation   Diabetic foot infection   DKA (diabetic ketoacidoses)   Unilateral complete BKA   Gangrene of toe   . aspirin EC  325 mg Oral Daily  . enoxaparin (LOVENOX) injection  40 mg Subcutaneous Q24H  . feeding supplement (GLUCERNA SHAKE)  237 mL Oral Q24H  . insulin aspart  0-20 Units Subcutaneous TID WC  . insulin aspart  0-5 Units Subcutaneous QHS  . insulin aspart protamine- aspart  12 Units Subcutaneous BID WC  . multivitamin with minerals  1 tablet Oral Daily    Subjective: He states he is feeling about the same. He did have one brief episode of chills last night. He has some dry cough but no shortness of breath. He has no diarrhea or dysuria.  Past Medical History  Diagnosis Date  . Hepatitis   . Hypertension   . Pneumonia     HX OF PNA  . Diabetes mellitus without complication     PATIENT JUST LEARNED HE WAS DIABETIC  . Necrotizing fasciitis   . Hyponatremia   . Hypokalemia     History  Substance Use Topics  . Smoking status: Never Smoker   . Smokeless tobacco: Never Used  . Alcohol Use: No    Family History  Problem Relation Age of Onset  . Diabetes type II Mother   . Dementia Mother   . Heart disease Father     No Known Allergies  Objective: Temp:  [97.7 F (36.5 C)-101.4 F (38.6 C)] 97.7 F (36.5 C) (02/17 1448) Pulse Rate:  [88-109] 88 (02/17 1448) Resp:  [17] 17 (02/17 1448) BP: (104-132)/(61-79) 127/71 mmHg (02/17 1448) SpO2:  [90 %-96 %] 96 % (02/17 1448)  General: He is in good spirits Skin: No rash Lungs: Clear Cor: Regular S1 and S2 with no murmurs Abdomen: Soft and nontender Joints and extremities: Left BKA stump not examined. No change in dry  gangrene of 3 toes on right foot  Lab Results Lab Results  Component Value Date   WBC 7.7 09/16/2013   HGB 8.1* 09/16/2013   HCT 24.5* 09/16/2013   MCV 88.8 09/16/2013   PLT 244 09/16/2013    Lab Results  Component Value Date   CREATININE 1.05 09/14/2013   BUN 10 09/14/2013   NA 141 09/14/2013   K 3.5* 09/14/2013   CL 102 09/14/2013   CO2 26 09/14/2013    Lab Results  Component Value Date   ALT 11 09/14/2013   AST 16 09/14/2013   ALKPHOS 74 09/14/2013   BILITOT 0.3 09/14/2013      Microbiology: Recent Results (from the past 240 hour(s))  SURGICAL PCR SCREEN     Status: None   Collection Time    09/11/13  9:38 AM      Result Value Ref Range Status   MRSA, PCR NEGATIVE  NEGATIVE Final   Staphylococcus aureus NEGATIVE  NEGATIVE Final   Comment:            The Xpert SA Assay (FDA     approved for NASAL specimens     in patients over 53 years of age),     is  one component of     a comprehensive surveillance     program.  Test performance has     been validated by [facility] for patients greater     than or equal to 89 year old.     It is not intended     to diagnose infection nor to     guide or monitor treatment.  URINE CULTURE     Status: None   Collection Time    09/15/13 12:52 AM      Result Value Ref Range Status   Specimen Description URINE, RANDOM   Final   Special Requests Zozyn and Vancomycin Normal   Final   Culture  Setup Time     Final   Value: 09/15/2013 14:54     Performed at [place]     Final   Value: NO GROWTH     Performed at [facility]   Culture     Final   Value: NO GROWTH     Performed at [facility]   Report Status 09/16/2013 FINAL   Final  CULTURE, BLOOD (ROUTINE X 2)     Status: None   Collection Time    09/15/13  1:55 AM      Result Value Ref Range Status   Specimen Description BLOOD RIGHT ANTECUBITAL   Final   Special Requests BOTTLES DRAWN AEROBIC AND ANAEROBIC 10CC   Final   Culture   Setup Time     Final   Value: 09/15/2013 14:36     Performed at [facility]   Culture     Final   Value:        BLOOD CULTURE RECEIVED NO GROWTH TO DATE CULTURE WILL BE HELD FOR 5 DAYS BEFORE ISSUING A FINAL NEGATIVE REPORT     Performed at [facility]   Report Status PENDING   Incomplete  CULTURE, BLOOD (ROUTINE X 2)     Status: None   Collection Time    09/15/13  2:00 AM      Result Value Ref Range Status   Specimen Description BLOOD RIGHT HAND   Final   Special Requests BOTTLES DRAWN AEROBIC AND ANAEROBIC 10CC   Final   Culture  Setup Time     Final   Value: 09/15/2013 14:36     Performed at [facility]   Culture     Final   Value:        BLOOD CULTURE RECEIVED NO GROWTH TO DATE CULTURE WILL BE HELD FOR 5 DAYS BEFORE ISSUING A FINAL NEGATIVE REPORT     Performed at [facility]   Report Status PENDING   Incomplete   Assessment: A cause of his recent fevers remains unclear. He does have some dry cough not convinced that he has pneumonia. I favor continued observation off of antibiotics for now.  Plan: 1. Observe off of antibiotics  Michel Bickers, MD [facility] [place] [phone] pager   [phone] cell 09/17/2013, 3:23 PM

## 2013-09-17 NOTE — Progress Notes (Signed)
Physical Therapy Session Note  Patient Details  Name: [name] MRN: [id number] Date of Birth: [birth year]  Today's Date: 09/17/2013 Time: [id number] and 1300-1330 Time Calculation (min): 62 min and 30 min   Short Term Goals: No short term goals set  Skilled Therapeutic Interventions/Progress Updates:   Pt still reporting diaphoretic episodes during the night.  Assessed vitals; RN notified of fever.  Pt willing to participate in therapy.  Performed supine > sit EOB supervision.  Donned DARCO shoe and sat EOB for MD assessment.  Performed transfer bed > w/c stand pivot with RW and min A still with increased WB through UE and decreased ability to advance RW.  Performed w/c mobility x 150' x 2 with supervision in controlled environment room <> gym.  In gym performed UE strengthening and gait training in // bars with focus on use of shoulder depression and elbow extension to elevate body and advance RLE x 10' x 2 forwards and backwards.  Continued with step up training in // bars hopping backwards onto 2" step >> 4" step x 8 reps each with min A to simulate stairs at sister's house with improved foot clearance each repetition.  Had long discussion with pt about D/C destination and safety.  Pt does not feel that because of the condition of his sister's house and lack of accessibility with w/c or ability to place a hospital bed he feels this will not be a safe destination to D/C to.  Pt is open to possibility of SNF for more long term rehab and care.  Continued gait training in hallway with RW x 25' with min A and improved safety and sequencing.  Returned to room and left in w/c to rest with all items within reach and L residual limb supported in extension.    PM session:  Pt resting in bed; fever continues to go down.  Pt performed supine > sit supervision and able to don pants EOB and maintain balance during forwards and lateral leaning with close supervision.  Performed static standing with one UE  support on RW with min A with alternating UE releasing RW to pull pants up over buttocks.  Transferred bed > w/c stand pivot with RW min A.  Performed w/c mobility on unit with supervision 100' x 2; educated pt on w/c parts management and set up for transfers with pt giving repeat demonstration with intermittent assistance to don leg rest.  Performed transfer training with focus on squat pivot sequence training while performing transfers to L and R at various heights with/without arm rests mat<< >> w/c<< >> arm chair<< >> low bench with supervision-min A and verbal cues to maintain full anterior lean and weight shift over R stance LE during pivot and to maintain full squat to clear arm rests.  Returned to w/c and to room with supervision.    Therapy Documentation Precautions:  Precautions Precautions: Fall Precaution Comments:   Required Braces or Orthoses: Other Brace/Splint Other Brace/Splint: R DARCO shoe when OOB Restrictions Weight Bearing Restrictions: Yes LLE Weight Bearing: Non weight bearing Other Position/Activity Restrictions: keep L knee straight/elevated when in w/c, bed Vital Signs: Therapy Vitals Temp: 100 F (37.8 C) Temp src: Oral Pulse Rate: 107 BP: 132/79 mmHg Patient Position, if appropriate: Sitting Oxygen Therapy SpO2: 90 % O2 Device: None (Room air) Pain: Pain Assessment Pain Assessment: No/denies pain Locomotion : Ambulation Ambulation/Gait Assistance: 4: Min assist Wheelchair Mobility Distance: 150   See FIM for current functional status  Therapy/Group: Individual Therapy  [name] 09/17/2013, 10:37 AM

## 2013-09-17 NOTE — Discharge Instructions (Signed)
Inpatient Rehab Discharge Instructions  [name] Discharge date and time:    Activities/Precautions/ Functional Status: Activity: activity as tolerated Diet: diabetic diet Wound Care: Wash with soap and water. Pat dry and apply dry compressive dressing to left BKA site. Wear support shoe on right foot when walking.  Functional status:  ___ No restrictions     ___ Walk up steps independently ___ 24/7 supervision/assistance   ___ Walk up steps with assistance ___ Intermittent supervision/assistance  ___ Bathe/dress independently ___ Walk with walker     ___ Bathe/dress with assistance ___ Walk Independently    ___ Shower independently ___ Walk with assistance    ___ Shower with assistance ___ No alcohol     ___ Return to work/school ________  Special Instructions:    My questions have been answered and I understand these instructions. I will adhere to these goals and the provided educational materials after my discharge from the hospital.  Patient/Caregiver Signature _______________________________ Date __________  Clinician Signature _______________________________________ Date __________  Please bring this form and your medication list with you to all your follow-up doctor's appointments.

## 2013-09-17 NOTE — Progress Notes (Signed)
Occupational Therapy Session Note  Patient Details  Name: Ashvik Sosnowski MRN: [id number] Date of Birth: [birth year]  Today's Date: 09/17/2013 Time: [id number] Time Calculation (min): 46 min  Skilled Therapeutic Interventions/Progress Updates:    Pt rolled himself down to the therapy gym in the wheelchair with supervision.  Once in the gym worked on static standing balance at the high/low table.  Pt able to stand for intervals of 4 mins and 10 mins while working on PVC pipe puzzle.  He was able to stand with min guard assist most of the time but did have a couple of instances of needing min assist with slight LOB to the left.  Pt able to let go with either UE to perform puzzle but needs min assist if he lets go with both UEs at the same time.  O2 sats 97% after standing with HR increasing to 114 BPM.  Pt took 2-3 minute rest breaks between each set.  At conclusion of session by rolled himself back to the room and transferred to the bed squat pivot to the left without use of assistive device.    Therapy Documentation Precautions:  Precautions Precautions: Fall Precaution Comments:   Required Braces or Orthoses: Other Brace/Splint Other Brace/Splint: R DARCO shoe when OOB Restrictions Weight Bearing Restrictions: Yes LLE Weight Bearing: Non weight bearing Other Position/Activity Restrictions: keep L knee straight/elevated when in w/c, bed  Pain: Pain Assessment Pain Assessment: No/denies pain  Other Treatments:    See FIM for current functional status  Therapy/Group: Individual Therapy  Jamani Eley OTR/L 09/17/2013, 3:37 PM

## 2013-09-18 ENCOUNTER — Inpatient Hospital Stay (HOSPITAL_COMMUNITY): Payer: Medicaid Other | Admitting: Occupational Therapy

## 2013-09-18 ENCOUNTER — Inpatient Hospital Stay (HOSPITAL_COMMUNITY): Payer: Medicaid Other | Admitting: Physical Therapy

## 2013-09-18 DIAGNOSIS — J3489 Other specified disorders of nose and nasal sinuses: Secondary | ICD-10-CM

## 2013-09-18 LAB — BASIC METABOLIC PANEL
BUN: 13 mg/dL (ref 6–23)
CHLORIDE: 97 meq/L (ref 96–112)
CO2: 22 mEq/L (ref 19–32)
CREATININE: 1.03 mg/dL (ref 0.50–1.35)
Calcium: 9.3 mg/dL (ref 8.4–10.5)
GFR calc non Af Amer: 80 mL/min — ABNORMAL LOW (ref >90)
Glucose, Bld: 149 mg/dL — ABNORMAL HIGH (ref 70–99)
POTASSIUM: 3.9 meq/L (ref 3.7–5.3)
Sodium: 135 mEq/L — ABNORMAL LOW (ref 137–147)

## 2013-09-18 LAB — GLUCOSE, CAPILLARY
GLUCOSE-CAPILLARY: 174 mg/dL — AB (ref 70–99)
GLUCOSE-CAPILLARY: 136 mg/dL — AB (ref 70–99)
Glucose-Capillary: 151 mg/dL — ABNORMAL HIGH (ref 70–99)
Glucose-Capillary: 159 mg/dL — ABNORMAL HIGH (ref 70–99)

## 2013-09-18 LAB — OCCULT BLOOD X 1 CARD TO LAB, STOOL: FECAL OCCULT BLD: NEGATIVE

## 2013-09-18 MED ORDER — TRAMADOL HCL 50 MG PO TABS
50.0000 mg | ORAL_TABLET | Freq: Four times a day (QID) | ORAL | Status: DC
  Administered 2013-09-18 – 2013-09-19 (×6): 50 mg via ORAL
  Filled 2013-09-18 (×6): qty 1

## 2013-09-18 MED ORDER — OXYCODONE HCL 5 MG PO TABS: 5.0000 mg | ORAL_TABLET | ORAL | Status: DC | PRN

## 2013-09-18 MED ORDER — INSULIN ASPART PROT & ASPART (70-30 MIX) 100 UNIT/ML ~~LOC~~ SUSP
15.0000 [IU] | Freq: Two times a day (BID) | SUBCUTANEOUS | Status: DC
  Administered 2013-09-18 – 2013-10-04 (×32): 15 [IU] via SUBCUTANEOUS
  Filled 2013-09-18 (×2): qty 10

## 2013-09-18 MED ORDER — BENEPROTEIN PO POWD
1.0000 | Freq: Three times a day (TID) | ORAL | Status: DC
  Administered 2013-09-18 – 2013-09-27 (×18): 6 g via ORAL
  Filled 2013-09-18: qty 227

## 2013-09-18 NOTE — Progress Notes (Signed)
Patient ID: [name], male   DOB: Apr 17, 1956, 58 y.o.   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 aspirin EC  325 mg Oral Daily  . enoxaparin (LOVENOX) injection  40 mg Subcutaneous Q24H  . insulin aspart  0-20 Units Subcutaneous TID WC  . insulin aspart  0-5 Units Subcutaneous QHS  . insulin aspart protamine- aspart  15 Units Subcutaneous BID WC  . multivitamin with minerals  1 tablet Oral Daily  . protein supplement  1 scoop Oral TID WC    Subjective: He says that he is feeling better today. He still has some sinus congestion and dry cough but no shortness of breath.  Past Medical History  Diagnosis Date  . Hepatitis   . Hypertension   . Pneumonia     HX OF PNA  . Diabetes mellitus without complication     PATIENT JUST LEARNED HE WAS DIABETIC  . Necrotizing fasciitis   . Hyponatremia   . Hypokalemia     History  Substance Use Topics  . Smoking status: Never Smoker   . Smokeless tobacco: Never Used  . Alcohol Use: No    Family History  Problem Relation Age of Onset  . Diabetes type II Mother   . Dementia Mother   . Heart disease Father     No Known Allergies  Objective: Temp:  [97.7 F (36.5 C)-99.5 F (37.5 C)] 99.5 F (37.5 C) (02/18 0601) Pulse Rate:  [88-104] 104 (02/18 0601) Resp:  [17] 17 (02/18 0601) BP: (125-127)/(71-74) 125/74 mmHg (02/18 0601) SpO2:  [90 %-96 %] 90 % (02/18 0601) Weight:  [65.1 kg (143 lb 8.3 oz)] 65.1 kg (143 lb 8.3 oz) (02/18 0601)  General: He is in good spirits Skin: No rash Lungs: Clear Cor: Regular S1 and S2 with no murmurs Abdomen: Soft and nontender Joints and extremities: Left BKA stump not examined. No change in  dry gangrene of 3 toes on right foot  Lab Results Lab Results  Component Value Date   WBC 7.7 09/16/2013   HGB 8.1* 09/16/2013   HCT 24.5* 09/16/2013   MCV 88.8 09/16/2013   PLT 244 09/16/2013    Lab Results  Component Value Date   CREATININE 1.05 09/14/2013   BUN 10 09/14/2013   NA 141 09/14/2013   K 3.5* 09/14/2013   CL 102 09/14/2013   CO2 26 09/14/2013    Lab Results  Component Value Date   ALT 11 09/14/2013   AST 16 09/14/2013   ALKPHOS 74 09/14/2013   BILITOT 0.3 09/14/2013      Microbiology: Recent Results (from the past 240 hour(s))  SURGICAL PCR SCREEN     Status: None   Collection Time    09/11/13  9:38 AM      Result Value Ref Range Status   MRSA, PCR NEGATIVE  NEGATIVE Final   Staphylococcus aureus NEGATIVE  NEGATIVE Final   Comment:            The Xpert SA Assay (FDA     approved for NASAL specimens     in patients over 78 years of age),  is one component of     a comprehensive surveillance     program.  Test performance has     been validated by [facility] for patients greater     than or equal to 36 year old.     It is not intended     to diagnose infection nor to     guide or monitor treatment.  URINE CULTURE     Status: None   Collection Time    09/15/13 12:52 AM      Result Value Ref Range Status   Specimen Description URINE, RANDOM   Final   Special Requests Zozyn and Vancomycin Normal   Final   Culture  Setup Time     Final   Value: 09/15/2013 14:54     Performed at [facility]     Final   Value: NO GROWTH     Performed at [facility]   Culture     Final   Value: NO GROWTH     Performed at [facility]   Report Status 09/16/2013 FINAL   Final  CULTURE, BLOOD (ROUTINE X 2)     Status: None   Collection Time    09/15/13  1:55 AM      Result Value Ref Range Status   Specimen Description BLOOD RIGHT ANTECUBITAL   Final   Special Requests BOTTLES DRAWN AEROBIC AND ANAEROBIC 10CC   Final   Culture   Setup Time     Final   Value: 09/15/2013 14:36     Performed at [facility]   Culture     Final   Value:        BLOOD CULTURE RECEIVED NO GROWTH TO DATE CULTURE WILL BE HELD FOR 5 DAYS BEFORE ISSUING A FINAL NEGATIVE REPORT     Performed at [facility]   Report Status PENDING   Incomplete  CULTURE, BLOOD (ROUTINE X 2)     Status: None   Collection Time    09/15/13  2:00 AM      Result Value Ref Range Status   Specimen Description BLOOD RIGHT HAND   Final   Special Requests BOTTLES DRAWN AEROBIC AND ANAEROBIC 10CC   Final   Culture  Setup Time     Final   Value: 09/15/2013 14:36     Performed at [facility]   Culture     Final   Value:        BLOOD CULTURE RECEIVED NO GROWTH TO DATE CULTURE WILL BE HELD FOR 5 DAYS BEFORE ISSUING A FINAL NEGATIVE REPORT     Performed at [facility]   Report Status PENDING   Incomplete   Assessment: He appears to have an upper respiratory infection which is probably viral. His fever seems to be resolving spontaneously.  Plan: 1. Continue observation off of antibiotics  Michel Bickers, MD [facility] for [place] [phone] pager   [phone] cell 09/18/2013, 10:40 AM

## 2013-09-18 NOTE — Progress Notes (Signed)
Physical Therapy Session Note  Patient Details  Name: [name] MRN: [id number] Date of Birth: 02-Jan-1956  Today's Date: 09/18/2013 Time: 1409-1500 and 1531-1600 Time Calculation (min): 51 min and 29 min  Short Term Goals: No short term goals set  Skilled Therapeutic Interventions/Progress Updates:   First session: pt resting in bed after medicaid/disability application process.  Pt reporting phantom pain/sensation in LLE but did not request medication.  Pt performed supine > sit supervision and bed > w/c squat pivot supervision.  Transitioned to mat <> w/c squat pivot supervision without removing arm rest.  Performed NMR with mirror therapy with LLE covered while watching reflection of RLE in mirror to simulate movement of LLE for treatment of phantom limb sensation and pain.  Returned to w/c and performed UE endurance and strength training while performing w/c mobility over carpet and tile >300' with added weight/resistance to back of w/c with multiple rest breaks.    Second session: Began with pressure relief training in w/c with demonstration and pt return demonstrating forward lean elbows on knees, lateral leans and w/c push ups.  Performed gait training with R and L lateral hopping x 15' each direction x 2, retro hopping x 15' x 2. Returned to sitting EOB.     Therapy Documentation Precautions:  Precautions Precautions: Fall Precaution Comments:   Required Braces or Orthoses: Other Brace/Splint Other Brace/Splint: R DARCO shoe when OOB Restrictions Weight Bearing Restrictions: Yes LLE Weight Bearing: Non weight bearing Other Position/Activity Restrictions: keep L knee straight/elevated when in w/c, bed General: Amount of Missed PT Time (min): 9 Minutes Missed Time Reason: Unavailable (comment) (meeting with medicaid Education officer, museum) Vital Signs: Therapy Vitals Temp: 98.6 F (37 C) Temp src: Oral Pulse Rate: 104 Resp: 17 BP: 127/72 mmHg Patient Position, if appropriate:  Sitting Oxygen Therapy SpO2: 96 % O2 Device: None (Room air) Pain: Pain Assessment Pain Assessment: No/denies pain Locomotion : Ambulation Ambulation/Gait Assistance: 4: Min assist Wheelchair Mobility Distance: 150   See FIM for current functional status  Therapy/Group: Individual Therapy  Raylene Everts [facility] 09/18/2013, 4:14 PM

## 2013-09-18 NOTE — Progress Notes (Signed)
NUTRITION FOLLOW UP  Intervention:   1.   Supplements; continue Glucerna shake daily. Continue MVI. 2.  General healthful diet; encourage intake of foods and beverages as able.  RD to follow and assess for nutritional adequacy.   Nutrition Dx:   Increased nutrient needs related to infected foot wound as evidenced by estimated nutrition needs.   Goal:  Intake to meet >90% of estimated nutrition needs to support wound healing; optimal glycemic control to support healing.   Monitor:  PO intake, labs, weight trend, education needs   Assessment:   Patient with history of hepatitis; presented to the hospital on 2/1 with foot swelling since last week. Patient became incontinent of urine, feeling lightheaded, blurred vision, patient states that he started to loose balance as well. X-ray on admission showed gas in the soft tissue. Underwent I&D with 4th and 5th amputation to L foot as well as placement of VAC. 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 Noted to refuse DM education by nursing staff during acute hospitalization.   Educated by RD staff on 2/4 during acute hospitalization - pt was given handouts and was noted to be feeling very overwhelmed with information. Confirmed with patient that he was eating well during acute hospitalization.  Pt continues to eat well at 100% of most meals.  Pt unavailable at time of visit- working with PT.  Continue current interventions.   Height: Ht Readings from Last 1 Encounters:  09/13/13 5\' 3"  (1.6 m)    Weight Status:   Wt Readings from Last 1 Encounters:  09/18/13 143 lb 8.3 oz (65.1 kg)    Re-estimated needs:  Kcal: 2000  Protein: 95 - 110 gm  Fluid: 2 liters daily  Skin: incision to stump site  Diet Order: Carb Control   Intake/Output Summary (Last 24 hours) at 09/18/13 1555 Last data filed at 09/18/13 0400  Gross per 24 hour  Intake    240 ml  Output   1100 ml  Net   -860 ml    Last BM:  2/18  Labs:   Recent Labs Lab 09/13/13 0726 09/14/13 0402 09/18/13 1113  NA 136* 141 135*  K 3.8 3.5* 3.9  CL 100 102 97  CO2 26 26 22   BUN 10 10 13   CREATININE 1.04 1.05 1.03  CALCIUM 8.2* 8.2* 9.3  GLUCOSE 225* 231* 149*    CBG (last 3)   Recent Labs  09/17/13 2113 09/18/13 0720 09/18/13 1148  GLUCAP 169* 174* 136*    Scheduled Meds: . aspirin EC  325 mg Oral Daily  . enoxaparin (LOVENOX) injection  40 mg Subcutaneous Q24H  . insulin aspart  0-20 Units Subcutaneous TID WC  . insulin aspart  0-5 Units Subcutaneous QHS  . insulin aspart protamine- aspart  15 Units Subcutaneous BID WC  . multivitamin with minerals  1 tablet Oral Daily  . protein supplement  1 scoop Oral TID WC  . traMADol  50 mg Oral 4 times per day    Continuous Infusions:   Brynda Greathouse, MS RD LDN Clinical Inpatient Dietitian Pager: [phone] Weekend/After hours pager: [phone]

## 2013-09-18 NOTE — Progress Notes (Signed)
Subjective/Complaints: 59 y.o. male with history of diabetes mellitus, HTN, h/o hepatitis, BLE numbness since frostbite last year but no medical care. He has been living in his car for past 4-5 years (had declined opportunity to live with family) who was brought to ED on 09/01/13 by family due to inability to care for self, confusion as well as left foot infection. X rays with extensive soft tissue gas consistent with cellulitis and abscess formation. He was found to have diabetic ketoacidosis and treated with insulin infusion. He was evaluated by Dr. Erlinda Hong and placed on IV antibiotics. His foot culture positive for proteus mirabilis--pan sensitive. MRI with for concern for necrotizing fascitis and patient taken to OR for I & D with 4 th and 5th toe amputation with placement of VAC. Also noted to have necrotic 1st, 2nd and 5 th toes on the right and likey allow auto amputation per ortho. He had poor healing of left foot and underwent transtibial amputation 09/11/2013  Felt warm  last noc, no fevers! Dry hacking cough, discussed Incentive spirometer Review of Systems - Negative except sweating and cough Objective: Vital Signs: Blood pressure 125/74, pulse 104, temperature 99.5 F (37.5 C), temperature source Oral, resp. rate 17, height _0  (1.6 m), weight 65.1 kg (143 lb 8.3 oz), SpO2 90.00%. No results found. Results for orders placed during the hospital encounter of 09/13/13 (from the past 72 hour(s))  GLUCOSE, CAPILLARY     Status: Abnormal   Collection Time    09/15/13 11:51 AM      Result Value Ref Range   Glucose-Capillary 259 (*) 70 - 99 mg/dL   Comment 1 Notify RN    GLUCOSE, CAPILLARY     Status: Abnormal   Collection Time    09/15/13  4:59 PM      Result Value Ref Range   Glucose-Capillary 238 (*) 70 - 99 mg/dL   Comment 1 Notify RN    GLUCOSE, CAPILLARY     Status: Abnormal   Collection Time    09/15/13  8:29 PM      Result Value Ref Range   Glucose-Capillary 217 (*) 70 - 99  mg/dL  GLUCOSE, CAPILLARY     Status: Abnormal   Collection Time    09/16/13  7:25 AM      Result Value Ref Range   Glucose-Capillary 218 (*) 70 - 99 mg/dL   Comment 1 Notify RN    CBC WITH DIFFERENTIAL     Status: Abnormal   Collection Time    09/16/13  8:04 AM      Result Value Ref Range   WBC 7.7  4.0 - 10.5 K/uL   RBC 2.76 (*) 4.22 - 5.81 MIL/uL   Hemoglobin 8.1 (*) 13.0 - 17.0 g/dL   HCT 24.5 (*) 39.0 - 52.0 %   MCV 88.8  78.0 - 100.0 fL   MCH 29.3  26.0 - 34.0 pg   MCHC 33.1  30.0 - 36.0 g/dL   RDW 13.9  11.5 - 15.5 %   Platelets 244  150 - 400 K/uL   Neutrophils Relative % 71  43 - 77 %   Neutro Abs 5.4  1.7 - 7.7 K/uL   Lymphocytes Relative 21  12 - 46 %   Lymphs Abs 1.6  0.7 - 4.0 K/uL   Monocytes Relative 8  3 - 12 %   Monocytes Absolute 0.6  0.1 - 1.0 K/uL   Eosinophils Relative 1  0 - 5 %  Eosinophils Absolute 0.1  0.0 - 0.7 K/uL   Basophils Relative 0  0 - 1 %   Basophils Absolute 0.0  0.0 - 0.1 K/uL  GLUCOSE, CAPILLARY     Status: Abnormal   Collection Time    09/16/13 11:24 AM      Result Value Ref Range   Glucose-Capillary 192 (*) 70 - 99 mg/dL  GLUCOSE, CAPILLARY     Status: Abnormal   Collection Time    09/16/13  4:59 PM      Result Value Ref Range   Glucose-Capillary 266 (*) 70 - 99 mg/dL   Comment 1 Notify RN    GLUCOSE, CAPILLARY     Status: Abnormal   Collection Time    09/16/13  9:10 PM      Result Value Ref Range   Glucose-Capillary 142 (*) 70 - 99 mg/dL  GLUCOSE, CAPILLARY     Status: Abnormal   Collection Time    09/17/13  7:24 AM      Result Value Ref Range   Glucose-Capillary 181 (*) 70 - 99 mg/dL   Comment 1 Notify RN    GLUCOSE, CAPILLARY     Status: Abnormal   Collection Time    09/17/13 11:22 AM      Result Value Ref Range   Glucose-Capillary 166 (*) 70 - 99 mg/dL   Comment 1 Notify RN    GLUCOSE, CAPILLARY     Status: Abnormal   Collection Time    09/17/13  4:25 PM      Result Value Ref Range   Glucose-Capillary 189 (*)  70 - 99 mg/dL   Comment 1 Notify RN    GLUCOSE, CAPILLARY     Status: Abnormal   Collection Time    09/17/13  9:13 PM      Result Value Ref Range   Glucose-Capillary 169 (*) 70 - 99 mg/dL  GLUCOSE, CAPILLARY     Status: Abnormal   Collection Time    09/18/13  7:20 AM      Result Value Ref Range   Glucose-Capillary 174 (*) 70 - 99 mg/dL   Comment 1 Notify RN    OCCULT BLOOD X 1 CARD TO LAB, STOOL     Status: None   Collection Time    09/18/13  9:14 AM      Result Value Ref Range   Fecal Occult Bld NEGATIVE  NEGATIVE      HENT: dentition poor  Head: Normocephalic.  Eyes: EOM are normal.  Neck: Normal range of motion. Neck supple. No thyromegaly present.  Cardiovascular: Normal rate and regular rhythm. No murmurs  Respiratory: Effort normal and breath sounds normal. No respiratory distress.  GI: Soft. Bowel sounds are normal. He exhibits no distension.  Musculoskeletal:   Shoulder , elbow, wrist And hand ROM normal  Neurological: He is alert and oriented to person, place, and time. A sensory deficit is present.  5/5 strength Bilateral delt, bi, tri, grip,  4/5 Bilateral HF and R KE  3- R ankle DF/PF  LLE hip anti gravity R Foot unchanged necrotic 1st, 2nd, 3rd toes no surrounding erythema  Ext no calf swelling or tenderness  Assessment/Plan: 1. Functional deficits secondary to Left BKA for gangrene (diabetic neuropathy, undiagnosed DM, no medical care) which require 3+ hours per day of interdisciplinary therapy in a comprehensive inpatient rehab setting. Physiatrist is providing close team supervision and 24 hour management of active medical problems listed below. Physiatrist and rehab team continue to assess  barriers to discharge/monitor patient progress toward functional and medical goals. Team conference today please see physician documentation under team conference tab, met with team face-to-face to discuss problems,progress, and goals. Formulized individual treatment  plan based on medical history, underlying problem and comorbidities. FIM: FIM - Bathing Bathing Steps Patient Completed: Chest;Right Arm;Left Arm;Abdomen;Front perineal area;Buttocks;Right upper leg;Left upper leg Bathing: 4: Min-Patient completes 8-9 [id number] parts or 75+ percent  FIM - Upper Body Dressing/Undressing Upper body dressing/undressing: 0: Wears gown/pajamas-no public clothing FIM - Lower Body Dressing/Undressing Lower body dressing/undressing steps patient completed: Don/Doff right sock;Don/Doff right shoe;Fasten/unfasten right shoe Lower body dressing/undressing: 0: Wears gown/pajamas-no public clothing  FIM - Toileting Toileting steps completed by patient: Performs perineal hygiene Toileting: 3: Mod-Patient completed 2 of 3 steps  FIM - TRadio producerDevices: Elevated toilet seat;Grab bars;Walker Toilet Transfers: 4-To toilet/BSC: Min A (steadying Pt. > 75%);4-From toilet/BSC: Min A (steadying Pt. > 75%)  FIM - Bed/Chair Transfer Bed/Chair Transfer Assistive Devices: Arm rests;Walker Bed/Chair Transfer: 4: Chair or W/C > Bed: Min A (steadying Pt. > 75%)  FIM - Locomotion: Wheelchair Distance: 150 Locomotion: Wheelchair: 5: Travels 150 ft or more: maneuvers on rugs and over door sills with supervision, cueing or coaxing FIM - Locomotion: Ambulation Locomotion: Ambulation Assistive Devices: Walker - Rolling;Parallel bars Ambulation/Gait Assistance: 4: Min assist Locomotion: Ambulation: 1: Travels less than 50 ft with minimal assistance (Pt.>75%)  Comprehension Comprehension Mode: Auditory Comprehension: 6-Follows complex conversation/direction: With extra time/assistive device  Expression Expression Mode: Verbal Expression: 6-Expresses complex ideas: With extra time/assistive device  Social Interaction Social Interaction: 6-Interacts appropriately with others with medication or extra time (anti-anxiety, antidepressant).  Problem  Solving Problem Solving: 6-Solves complex problems: With extra time  Memory Memory: 6-More than reasonable amt of time  Medical Problem List and Plan:  Left BKA, and R necrotic toes  1. DVT Prophylaxis/Anticoagulation: Add SQ lovenox. Check RLE dopplers  2. Pain Management: Prn oxcycodone.  3. Mood: Does not express any signs of distress or concerns. LCSW to follow for evaluation.  4. Neuropsych: This patient is capable of making decisions on his own behalf.  5. DM type 2 poorly controlled: Monitor BS with ac/hs checks. Will change lantus to 70/30 insulin as patient without insurance.titrate up Use SSI for elevated BS and titrate as indicated. Question compliance past discharge.  6. Recurrent fever: Question drug related as has had low grade fever with spike last pm. Off  abx per ID,Wound cx plus BC x 2 + for proteus pan sensitive, BC x1 + coag neg staph, repeat BC neg, per ortho wound ok -continue to closely watch the right foot. This looks unchanged vs baseline  7. Anemia , guaic - x 1  LOS (Days) 5 A FACE TO FACE EVALUATION WAS PERFORMED  [name] E 09/18/2013, 9:47 AM

## 2013-09-18 NOTE — Progress Notes (Signed)
Occupational Therapy Session Note  Patient Details  Name: [name] MRN: [id number] Date of Birth: May 12, 1956  Today's Date: 09/18/2013 Time: 0730-0825 and [id number]  Time Calculation (min): 55 min and 40 min  Short Term Goals: Week 1:  OT Short Term Goal 1 (Week 1): Short Term Goals = Long Term Goals  Skilled Therapeutic Interventions/Progress Updates:    1) Engaged in ADL retraining with focus on sit > stand and standing balance with bathing and dressing.  Pt completed bathing at sink with close supervision when standing, with increased posterior lean while standing to pull up pants.  Pt reports need to toilet.  Performed stand pivot from w/c to toilet with use of grab bar with close supervision.  Pt relying heavily on grab bar and stabilizing RLE against toilet for further support.  Engaged in grooming tasks at sink in standing with focus on standing balance and tolerance.  Transferred back to bed at end of session with squat pivot close supervision.  2) 1:1 OT with focus on balance, standing tolerance, and ambulation short distances.  Pt propelled w/c to day room > 200 feet without rest break.  Ambulated through narrow space to water plants, requiring pt to maintain standing balance with only 1 UE support, pt able to alternate UE use during task.  One rest break during session with HR in the 120s, staying at low 100s post 1-2 minute rest.  O2 remained > 92% throughout tasks.  Propelled w/c back to room with 1 rest break, returned to bed at end of session despite encouragement to sit up in w/c or recliner.    Therapy Documentation Precautions:  Precautions Precautions: Fall Precaution Comments:   Required Braces or Orthoses: Other Brace/Splint Other Brace/Splint: R DARCO shoe when OOB Restrictions Weight Bearing Restrictions: Yes LLE Weight Bearing: Non weight bearing Other Position/Activity Restrictions: keep L knee straight/elevated when in w/c, bed Pain: Pain Assessment Pain  Assessment: No/denies pain  See FIM for current functional status  Therapy/Group: Individual Therapy  [name] 09/18/2013, 11:19 AM

## 2013-09-19 ENCOUNTER — Inpatient Hospital Stay (HOSPITAL_COMMUNITY): Payer: Medicaid Other

## 2013-09-19 ENCOUNTER — Inpatient Hospital Stay (HOSPITAL_COMMUNITY): Payer: Medicaid Other | Admitting: Occupational Therapy

## 2013-09-19 ENCOUNTER — Inpatient Hospital Stay (HOSPITAL_COMMUNITY): Payer: Self-pay

## 2013-09-19 ENCOUNTER — Encounter (HOSPITAL_COMMUNITY): Payer: Self-pay | Admitting: Occupational Therapy

## 2013-09-19 DIAGNOSIS — L0291 Cutaneous abscess, unspecified: Secondary | ICD-10-CM

## 2013-09-19 DIAGNOSIS — S88119A Complete traumatic amputation at level between knee and ankle, unspecified lower leg, initial encounter: Secondary | ICD-10-CM

## 2013-09-19 DIAGNOSIS — E1310 Other specified diabetes mellitus with ketoacidosis without coma: Secondary | ICD-10-CM

## 2013-09-19 DIAGNOSIS — L98499 Non-pressure chronic ulcer of skin of other sites with unspecified severity: Secondary | ICD-10-CM

## 2013-09-19 DIAGNOSIS — R110 Nausea: Secondary | ICD-10-CM

## 2013-09-19 DIAGNOSIS — L0390 Cellulitis, unspecified: Secondary | ICD-10-CM

## 2013-09-19 DIAGNOSIS — M726 Necrotizing fasciitis: Secondary | ICD-10-CM

## 2013-09-19 DIAGNOSIS — I739 Peripheral vascular disease, unspecified: Secondary | ICD-10-CM

## 2013-09-19 LAB — GLUCOSE, CAPILLARY
Glucose-Capillary: 200 mg/dL — ABNORMAL HIGH (ref 70–99)
Glucose-Capillary: 230 mg/dL — ABNORMAL HIGH (ref 70–99)
Glucose-Capillary: 181 mg/dL — ABNORMAL HIGH (ref 70–99)
Glucose-Capillary: 142 mg/dL — ABNORMAL HIGH (ref 70–99)
Glucose-Capillary: 160 mg/dL — ABNORMAL HIGH (ref 70–99)

## 2013-09-19 LAB — BASIC METABOLIC PANEL
BUN: 19 mg/dL (ref 6–23)
CO2: 28 mEq/L (ref 19–32)
CREATININE: 1.13 mg/dL (ref 0.50–1.35)
Calcium: 9.5 mg/dL (ref 8.4–10.5)
Chloride: 100 mEq/L (ref 96–112)
GFR calc Af Amer: 83 mL/min — ABNORMAL LOW (ref >90)
GFR calc non Af Amer: 71 mL/min — ABNORMAL LOW (ref >90)
Glucose, Bld: 190 mg/dL — ABNORMAL HIGH (ref 70–99)
Potassium: 4.4 mEq/L (ref 3.7–5.3)
Sodium: 143 mEq/L (ref 137–147)

## 2013-09-19 LAB — CBC WITH DIFFERENTIAL/PLATELET
BASOS PCT: 0 % (ref 0–1)
Basophils Absolute: 0 10*3/uL (ref 0.0–0.1)
Eosinophils Absolute: 0 10*3/uL (ref 0.0–0.7)
Eosinophils Relative: 0 % (ref 0–5)
HEMATOCRIT: 23.8 % — AB (ref 39.0–52.0)
HEMOGLOBIN: 7.7 g/dL — AB (ref 13.0–17.0)
LYMPHS ABS: 0.8 10*3/uL (ref 0.7–4.0)
LYMPHS PCT: 10 % — AB (ref 12–46)
MCH: 28.6 pg (ref 26.0–34.0)
MCHC: 32.4 g/dL (ref 30.0–36.0)
MCV: 88.5 fL (ref 78.0–100.0)
MONOS PCT: 5 % (ref 3–12)
Monocytes Absolute: 0.4 10*3/uL (ref 0.1–1.0)
NEUTROS ABS: 7.3 10*3/uL (ref 1.7–7.7)
Neutrophils Relative %: 85 % — ABNORMAL HIGH (ref 43–77)
Platelets: 318 10*3/uL (ref 150–400)
RBC: 2.69 MIL/uL — ABNORMAL LOW (ref 4.22–5.81)
RDW: 14 % (ref 11.5–15.5)
WBC: 8.5 10*3/uL (ref 4.0–10.5)

## 2013-09-19 MED ORDER — SODIUM CHLORIDE 0.45 % IV SOLN
INTRAVENOUS | Status: DC
  Administered 2013-09-19 – 2013-09-21 (×4): via INTRAVENOUS

## 2013-09-19 NOTE — Progress Notes (Signed)
Occupational Therapy Session Note  Patient Details  Name: [name] MRN: [id number] Date of Birth: 1956-03-15  Today's Date: 09/19/2013 Time: 1300-1315 Time Calculation (min): 15 min  Short Term Goals: Week 1:  OT Short Term Goal 1 (Week 1): Short Term Goals = Long Term Goals  Skilled Therapeutic Interventions/Progress Updates:    1) Pt missed 60 mins skilled OT due to just having received breakfast and wanting to eat before engaging in treatment session.  Pt reports episode of dry heaving this AM around 0500 and sensation of "blacking out" and therefore not feeling well this AM.  Upon return MD in room and pt vomiting. MD requesting vitals and reports pt will not be participating in therapy at this time.  Will follow up as able.  2) Engaged in toilet transfer and standing tolerance to complete grooming task.  Pt reports need to toilet.  Performed stand pivot w/c to toilet with use of grab bar and close supervision.  Post toileting pt completed hygiene and pulled up pants with close supervision and support of LLE against toilet.  Completed hand hygiene in standing at sink with cues for forward weight shifting to not rely on leaning against w/c seat in standing. Transport arrived to take pt down to radiology, missed last 15 mins.  Therapy Documentation Precautions:  Precautions Precautions: Fall Precaution Comments:   Required Braces or Orthoses: Other Brace/Splint Other Brace/Splint: R DARCO shoe when OOB Restrictions Weight Bearing Restrictions: Yes LLE Weight Bearing: Non weight bearing Other Position/Activity Restrictions: keep L knee straight/elevated when in w/c, bed General: General Amount of Missed OT Time (min): 60 and 15 Minutes Missed Time Reason: Patient ill (comment) (vomiting) Pain: Pain Assessment Pain Assessment: No/denies pain Pain Score: 0-No pain  See FIM for current functional status  Therapy/Group: Individual Therapy  [name] 09/19/2013, 2:03  PM

## 2013-09-19 NOTE — Progress Notes (Signed)
Physical Therapy Note  Patient Details  Name: [name] MRN: [id number] Date of Birth: 11/09/1955 Today's Date: 09/19/2013  Patient missed 45 minutes of physical therapy. Patient resting in bed, reports another episode of vomiting earlier - since lunch. Patient asked to skip today's sessions and wait until tomorrow.    [name] 09/19/2013, 3:23 PM

## 2013-09-19 NOTE — Progress Notes (Signed)
Patient ID: [name], male   DOB: 1956-05-03, 58 y.o.   MRN: [id number]         [facility]    Date of Admission:  09/13/2013       Principal Problem:   Unilateral complete BKA Active Problems:   Fever with chills   DM (diabetes mellitus) type II controlled, neurological manifestation   Type 2 diabetes mellitus with diabetic foot infection   Gangrene of toe   . aspirin EC  325 mg Oral Daily  . enoxaparin (LOVENOX) injection  40 mg Subcutaneous Q24H  . insulin aspart  0-20 Units Subcutaneous TID WC  . insulin aspart  0-5 Units Subcutaneous QHS  . insulin aspart protamine- aspart  15 Units Subcutaneous BID WC  . multivitamin with minerals  1 tablet Oral Daily  . protein supplement  1 scoop Oral TID WC  . traMADol  50 mg Oral 4 times per day    Subjective: He is having some problems with nausea this morning but states that his cough and sinus congestion have resolved.  Past Medical History  Diagnosis Date  . Hepatitis   . Hypertension   . Pneumonia     HX OF PNA  . Diabetes mellitus without complication     PATIENT JUST LEARNED HE WAS DIABETIC  . Necrotizing fasciitis   . Hyponatremia   . Hypokalemia     History  Substance Use Topics  . Smoking status: Never Smoker   . Smokeless tobacco: Never Used  . Alcohol Use: No    Family History  Problem Relation Age of Onset  . Diabetes type II Mother   . Dementia Mother   . Heart disease Father     No Known Allergies  Objective: Temp:  [98.1 F (36.7 C)-99.1 F (37.3 C)] 99.1 F (37.3 C) (02/19 0805) Pulse Rate:  [94-104] 94 (02/19 0805) Resp:  [17-19] 18 (02/19 0805) BP: (127-146)/(72-79) 142/79 mmHg (02/19 0805) SpO2:  [90 %-97 %] 90 % (02/19 0805)  General: He is in good spirits Skin: No rash Lungs: Clear Cor: Regular S1 and S2 with no murmurs Abdomen: Soft and nontender Joints and extremities: Left BKA stump not examined. No change in dry gangrene of 3 toes on right  foot  Lab Results Lab Results  Component Value Date   WBC 7.7 09/16/2013   HGB 8.1* 09/16/2013   HCT 24.5* 09/16/2013   MCV 88.8 09/16/2013   PLT 244 09/16/2013    Lab Results  Component Value Date   CREATININE 1.03 09/18/2013   BUN 13 09/18/2013   NA 135* 09/18/2013   K 3.9 09/18/2013   CL 97 09/18/2013   CO2 22 09/18/2013    Lab Results  Component Value Date   ALT 11 09/14/2013   AST 16 09/14/2013   ALKPHOS 74 09/14/2013   BILITOT 0.3 09/14/2013      Microbiology: Recent Results (from the past 240 hour(s))  SURGICAL PCR SCREEN     Status: None   Collection Time    09/11/13  9:38 AM      Result Value Ref Range Status   MRSA, PCR NEGATIVE  NEGATIVE Final   Staphylococcus aureus NEGATIVE  NEGATIVE Final   Comment:            The Xpert SA Assay (FDA     approved for NASAL specimens     in patients over 70 years of age),     is one component of  a comprehensive surveillance     program.  Test performance has     been validated by [facility] for patients greater     than or equal to 59 year old.     It is not intended     to diagnose infection nor to     guide or monitor treatment.  URINE CULTURE     Status: None   Collection Time    09/15/13 12:52 AM      Result Value Ref Range Status   Specimen Description URINE, RANDOM   Final   Special Requests Zozyn and Vancomycin Normal   Final   Culture  Setup Time     Final   Value: 09/15/2013 14:54     Performed at [place]     Final   Value: NO GROWTH     Performed at [facility]   Culture     Final   Value: NO GROWTH     Performed at [facility]   Report Status 09/16/2013 FINAL   Final  CULTURE, BLOOD (ROUTINE X 2)     Status: None   Collection Time    09/15/13  1:55 AM      Result Value Ref Range Status   Specimen Description BLOOD RIGHT ANTECUBITAL   Final   Special Requests BOTTLES DRAWN AEROBIC AND ANAEROBIC 10CC   Final   Culture  Setup Time     Final   Value:  09/15/2013 14:36     Performed at [facility]   Culture     Final   Value:        BLOOD CULTURE RECEIVED NO GROWTH TO DATE CULTURE WILL BE HELD FOR 5 DAYS BEFORE ISSUING A FINAL NEGATIVE REPORT     Performed at [facility]   Report Status PENDING   Incomplete  CULTURE, BLOOD (ROUTINE X 2)     Status: None   Collection Time    09/15/13  2:00 AM      Result Value Ref Range Status   Specimen Description BLOOD RIGHT HAND   Final   Special Requests BOTTLES DRAWN AEROBIC AND ANAEROBIC 10CC   Final   Culture  Setup Time     Final   Value: 09/15/2013 14:36     Performed at [facility]   Culture     Final   Value:        BLOOD CULTURE RECEIVED NO GROWTH TO DATE CULTURE WILL BE HELD FOR 5 DAYS BEFORE ISSUING A FINAL NEGATIVE REPORT     Performed at [facility]   Report Status PENDING   Incomplete   Assessment: His fevers have resolved. I see no evidence of active infection.  Plan: 1. Continue observation off of antibiotics 2. I will sign off for now  Michel Bickers, MD [facility] [place] [phone] pager   [phone] cell 09/19/2013, 10:49 AM

## 2013-09-19 NOTE — Patient Care Conference (Signed)
Inpatient RehabilitationTeam Conference and Plan of Care Update Date: 09/18/2013   Time: 11;55 AM    Patient Name: [name]      Medical Record Number: [id number]  Date of Birth: 1955-10-29 Sex: Male         Room/Bed: 4M04C/4M04C-01 Payor Info: Payor: /    Admitting Diagnosis: L BKA  Admit Date/Time:  09/13/2013  4:36 PM Admission Comments: No comment available   Primary Diagnosis:  Unilateral complete BKA Principal Problem: Unilateral complete BKA  Patient Active Problem List   Diagnosis Date Noted  . Gangrene of toe 09/16/2013  . Fever with chills 09/16/2013  . Unilateral complete BKA 09/13/2013  . DKA (diabetic ketoacidoses) 09/04/2013  . 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 DM (diabetes mellitus) type II controlled, neurological manifestation 09/01/2013    Expected Discharge Date:    Team Members Present: Physician leading conference: Dr. Alysia Penna Social Worker Present: Alfonse Alpers, LCSW;Becky Trenton Verne, LCSW Nurse Present: Other (comment) Gardiner Fanti) PT Present: Raylene Everts, PT OT Present: [name], OT;Kris Nira Retort, OT SLP Present: Gunnar Fusi, SLP PPS Coordinator present : Ileana Ladd, Lelan Pons, RN, CRRN     Current Status/Progress Goal Weekly Team Focus  Medical   fevers resolved, anemia, BKA   maintain medical stabilty  D/C antibiotics   Bowel/Bladder   pt continent of bowel and bladder         Swallow/Nutrition/ Hydration     WFL        ADL's   unable to assess dressing with no clothing, min assist bathing, min-supervision transfers  Mod I overall  activity tolerance/endurance, standing balance, transfers   Mobility   Supervision w/c mobility and transfers, mod-max A gait very short distances and stair negotiation   Mod I transfers and w/c mobility, supervision gait, min A stairs  Standing balance and endurance, gait, stairs, D/C planning   Communication     [facility]         Safety/Cognition/ Behavioral Observations    No unsafe behaviors        Pain   no complaints of pain at this time         Skin   LBKA- sutures inplace, incision apporximated; right great toe, 4th, 5th toe narcrotic with dry flaky skin to foot.  remain free of skin breakdown and free from infection  change dressings as ordered, assess skin qshift      *See Care Plan and progress notes for long and short-term goals.  Barriers to Discharge: Poor D/C disposition    Possible Resolutions to Barriers:  Possible D/C to rest home    Discharge Planning/Teaching Needs:  Still deciding between sister's or brother's home-has been offered both.  At times will be alone both work.  pt needs financial help with medications and insulin.    Team Discussion:  Fevers resolved-new diabetic needs teaching. Sister's home not feasible for discharge-team recommends NH versus RH placement.  Pt has not applied for SSD or Medicaid yet.  Revisions to Treatment Plan:  Has no home to discharge too-NH versus RH placement   Continued Need for Acute Rehabilitation Level of Care: The patient requires daily medical management by a physician with specialized training in physical medicine and rehabilitation for the following conditions: Daily direction of a multidisciplinary physical rehabilitation program to ensure safe treatment while eliciting the highest outcome that is of practical value to the patient.: Yes Daily medical management of patient stability for increased activity during  participation in an intensive rehabilitation regime.: Yes Daily analysis of laboratory values and/or radiology reports with any subsequent need for medication adjustment of medical intervention for : Other  Elease Hashimoto 09/19/2013, 8:59 AM

## 2013-09-19 NOTE — Progress Notes (Signed)
Physical Therapy Note  Patient Details  Name: Leighland Misfeldt MRN: [id number] Date of Birth: August 28, 1955 Today's Date: 09/19/2013  9:00 - 9:15 15 minutes individual session Patient missed 45 minutes due to vomiting. Patient denied pain.  OT reported that patient missed earlier session due to vomiting. Patient sitting in wheelchair upon entering room. Pt asked pt if he felt like participating. Patient was willing to try at least some exercise. Patient propelled wheelchair to gym 100 feet with bilateral UE. Patient asked to set up wheelchair for transfer. Patient reached for footrests and began to vomit. Patient returned to room and left in wheelchair with all items in reach. Nurse notified of vomiting episode.   [name] 09/19/2013, 10:15 AM

## 2013-09-19 NOTE — Progress Notes (Addendum)
Subjective/Complaints: 58 y.o. male with history of diabetes mellitus, HTN, h/o hepatitis, BLE numbness since frostbite last year but no medical care. He has been living in his car for past 4-5 years (had declined opportunity to live with family) who was brought to ED on 09/01/13 by family due to inability to care for self, confusion as well as left foot infection. X rays with extensive soft tissue gas consistent with cellulitis and abscess formation. He was found to have diabetic ketoacidosis and treated with insulin infusion. He was evaluated by Dr. Erlinda Hong and placed on IV antibiotics. His foot culture positive for proteus mirabilis--pan sensitive. MRI with for concern for necrotizing fascitis and patient taken to OR for I & D with 4 th and 5th toe amputation with placement of VAC. Also noted to have necrotic 1st, 2nd and 5 th toes on the right and likey allow auto amputation per ortho. He had poor healing of left foot and underwent transtibial amputation 09/11/2013   Nausea and vomiting this morning. No abdominal pain. Had a large continent stool yesterday. Only had juice today however did have mixed fruit in his vomited material this morning   Review of Systems - negative except for vomiting and feeling light head  Objective: Vital Signs: Blood pressure 142/79, pulse 94, temperature 99.1 F (37.3 C), temperature source Oral, resp. rate 18, height $RemoveBe'5\' 3"'aTVdBLPao$  (1.6 m), weight 65.1 kg (143 lb 8.3 oz), SpO2 90.00%. No results found. Results for orders placed during the hospital encounter of 09/13/13 (from the past 72 hour(s))  GLUCOSE, CAPILLARY     Status: Abnormal   Collection Time    09/16/13 11:24 AM      Result Value Ref Range   Glucose-Capillary 192 (*) 70 - 99 mg/dL  GLUCOSE, CAPILLARY     Status: Abnormal   Collection Time    09/16/13  4:59 PM      Result Value Ref Range   Glucose-Capillary 266 (*) 70 - 99 mg/dL   Comment 1 Notify RN    GLUCOSE, CAPILLARY     Status: Abnormal   Collection  Time    09/16/13  9:10 PM      Result Value Ref Range   Glucose-Capillary 142 (*) 70 - 99 mg/dL  GLUCOSE, CAPILLARY     Status: Abnormal   Collection Time    09/17/13  7:24 AM      Result Value Ref Range   Glucose-Capillary 181 (*) 70 - 99 mg/dL   Comment 1 Notify RN    GLUCOSE, CAPILLARY     Status: Abnormal   Collection Time    09/17/13 11:22 AM      Result Value Ref Range   Glucose-Capillary 166 (*) 70 - 99 mg/dL   Comment 1 Notify RN    GLUCOSE, CAPILLARY     Status: Abnormal   Collection Time    09/17/13  4:25 PM      Result Value Ref Range   Glucose-Capillary 189 (*) 70 - 99 mg/dL   Comment 1 Notify RN    GLUCOSE, CAPILLARY     Status: Abnormal   Collection Time    09/17/13  9:13 PM      Result Value Ref Range   Glucose-Capillary 169 (*) 70 - 99 mg/dL  GLUCOSE, CAPILLARY     Status: Abnormal   Collection Time    09/18/13  7:20 AM      Result Value Ref Range   Glucose-Capillary 174 (*) 70 - 99 mg/dL  Comment 1 Notify RN    OCCULT BLOOD X 1 CARD TO LAB, STOOL     Status: None   Collection Time    09/18/13  9:14 AM      Result Value Ref Range   Fecal Occult Bld NEGATIVE  NEGATIVE  BASIC METABOLIC PANEL     Status: Abnormal   Collection Time    09/18/13 11:13 AM      Result Value Ref Range   Sodium 135 (*) 137 - 147 mEq/L   Potassium 3.9  3.7 - 5.3 mEq/L   Chloride 97  96 - 112 mEq/L   CO2 22  19 - 32 mEq/L   Glucose, Bld 149 (*) 70 - 99 mg/dL   BUN 13  6 - 23 mg/dL   Creatinine, Ser 1.03  0.50 - 1.35 mg/dL   Calcium 9.3  8.4 - 10.5 mg/dL   GFR calc non Af Amer 80 (*) >90 mL/min   GFR calc Af Amer >90  >90 mL/min   Comment: (NOTE)     The eGFR has been calculated using the CKD EPI equation.     This calculation has not been validated in all clinical situations.     eGFR's persistently <90 mL/min signify possible Chronic Kidney     Disease.  GLUCOSE, CAPILLARY     Status: Abnormal   Collection Time    09/18/13 11:48 AM      Result Value Ref Range    Glucose-Capillary 136 (*) 70 - 99 mg/dL   Comment 1 Notify RN    GLUCOSE, CAPILLARY     Status: Abnormal   Collection Time    09/18/13  4:04 PM      Result Value Ref Range   Glucose-Capillary 151 (*) 70 - 99 mg/dL   Comment 1 Notify RN    GLUCOSE, CAPILLARY     Status: Abnormal   Collection Time    09/18/13  8:58 PM      Result Value Ref Range   Glucose-Capillary 159 (*) 70 - 99 mg/dL  GLUCOSE, CAPILLARY     Status: Abnormal   Collection Time    09/19/13  7:30 AM      Result Value Ref Range   Glucose-Capillary 200 (*) 70 - 99 mg/dL   Comment 1 Notify RN    GLUCOSE, CAPILLARY     Status: Abnormal   Collection Time    09/19/13  8:05 AM      Result Value Ref Range   Glucose-Capillary 230 (*) 70 - 99 mg/dL      HENT: dentition poor  Head: Normocephalic.  Eyes: EOM are normal.  Neck: Normal range of motion. Neck supple. No thyromegaly present.  Cardiovascular: Normal rate and regular rhythm. No murmurs  Respiratory: Effort normal and breath sounds normal. No respiratory distress.  GI: Soft. Bowel sounds are normal. He exhibits no distension.  Musculoskeletal:   Shoulder , elbow, wrist And hand ROM normal  Neurological: He is alert and oriented to person, place, and time. A sensory deficit is present.  5/5 strength Bilateral delt, bi, tri, grip,  4/5 Bilateral HF and R KE  3- R ankle DF/PF  LLE hip anti gravity R Foot unchanged necrotic 1st, 2nd, 3rd toes no surrounding erythema  Ext no calf swelling or tenderness  Assessment/Plan: 1. Functional deficits secondary to Left BKA for gangrene (diabetic neuropathy, undiagnosed DM, no medical care) which require 3+ hours per day of interdisciplinary therapy in a comprehensive inpatient rehab setting. Physiatrist  is providing close team supervision and 24 hour management of active medical problems listed below. Physiatrist and rehab team continue to assess barriers to discharge/monitor patient progress toward functional and  medical goals.  FIM: FIM - Bathing Bathing Steps Patient Completed: Chest;Right Arm;Left Arm;Abdomen;Front perineal area;Buttocks;Right upper leg;Left upper leg Bathing: 4: Min-Patient completes 8-9 45f 10 parts or 75+ percent  FIM - Upper Body Dressing/Undressing Upper body dressing/undressing: 0: Wears gown/pajamas-no public clothing FIM - Lower Body Dressing/Undressing Lower body dressing/undressing steps patient completed: Don/Doff right sock;Don/Doff right shoe;Fasten/unfasten right shoe;Pull pants up/down Lower body dressing/undressing: 5: Supervision: Safety issues/verbal cues  FIM - Toileting Toileting steps completed by patient: Performs perineal hygiene Toileting: 4: Steadying assist  FIM - Radio producer Devices: Elevated toilet seat;Grab bars Toilet Transfers: 5-To toilet/BSC: Supervision (verbal cues/safety issues);4-From toilet/BSC: Min A (steadying Pt. > 75%)  FIM - Bed/Chair Transfer Bed/Chair Transfer Assistive Devices: Arm rests;Walker Bed/Chair Transfer: 5: Supine > Sit: Supervision (verbal cues/safety issues);5: Sit > Supine: Supervision (verbal cues/safety issues);5: Bed > Chair or W/C: Supervision (verbal cues/safety issues);5: Chair or W/C > Bed: Supervision (verbal cues/safety issues)  FIM - Locomotion: Wheelchair Distance: 150 Locomotion: Wheelchair: 5: Travels 150 ft or more: maneuvers on rugs and over door sills with supervision, cueing or coaxing FIM - Locomotion: Ambulation Locomotion: Ambulation Assistive Devices: Administrator Ambulation/Gait Assistance: 4: Min assist Locomotion: Ambulation: 1: Travels less than 50 ft with minimal assistance (Pt.>75%)  Comprehension Comprehension Mode: Auditory Comprehension: 6-Follows complex conversation/direction: With extra time/assistive device  Expression Expression Mode: Verbal Expression: 6-Expresses complex ideas: With extra time/assistive device  Social  Interaction Social Interaction: 6-Interacts appropriately with others with medication or extra time (anti-anxiety, antidepressant).  Problem Solving Problem Solving: 6-Solves complex problems: With extra time  Memory Memory: 6-More than reasonable amt of time  Medical Problem List and Plan:  Left BKA, and R necrotic toes  1. DVT Prophylaxis/Anticoagulation: Add SQ lovenox. Check RLE dopplers  2. Pain Management: Prn oxcycodone.  3. Mood: Does not express any signs of distress or concerns. LCSW to follow for evaluation.  4. Neuropsych: This patient is capable of making decisions on his own behalf.  5. DM type 2 poorly controlled: Monitor BS with ac/hs checks. Will change lantus to 70/30 insulin as patient without insurance.titrate up Use SSI for elevated BS and titrate as indicated. Question compliance past discharge.  6. Recurrent fever: Question drug related as has had low grade fever with spike last pm. Off  abx per ID,Wound cx plus BC x 2 + for proteus pan sensitive, BC x1 + coag neg staph, repeat BC neg, per ortho wound ok -continue to closely watch the right foot. This looks unchanged vs baseline  7. Anemia , guaic - x 1 8. Vomiting, check KUB although given he's had recent normal bowel movements I doubt he has an ileus.  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 met  run IV fluids during the day today LOS (Days) 6 A FACE TO FACE EVALUATION WAS PERFORMED  KIRSTEINS,ANDREW E 09/19/2013, 10:50 AM

## 2013-09-19 NOTE — Progress Notes (Signed)
Social Work Patient ID: [name], male   DOB: October 02, 1955, 58 y.o.   MRN: [id number] Met with pt to discuss team conference goals-supervision/mod/i level and discharge plan.  Pt has no where to go, his sister and brother's homes are not Feasible or accessible to him.  He has agree and feels the best option for him is to go to a NH or RH.  At his current level he would qualify for NH since new diabetic and Needs to learn insulin and blood sugars.  He has applied for [facility] 2/18 and will apply for SSD tomorrow in room at 10;00 with servant [facility].  Will begin the Bed search, he is aware he may not be placed locally but may need to go where they will take a letter of guarantee from the hospital.  He was agreeable to this and feels better About his situation.  He did not feel he could manage at sister's or brother's homes.

## 2013-09-20 ENCOUNTER — Inpatient Hospital Stay (HOSPITAL_COMMUNITY): Payer: Medicaid Other | Admitting: Occupational Therapy

## 2013-09-20 ENCOUNTER — Inpatient Hospital Stay (HOSPITAL_COMMUNITY): Payer: Medicaid Other | Admitting: Physical Therapy

## 2013-09-20 DIAGNOSIS — E1310 Other specified diabetes mellitus with ketoacidosis without coma: Secondary | ICD-10-CM

## 2013-09-20 DIAGNOSIS — L02619 Cutaneous abscess of unspecified foot: Secondary | ICD-10-CM

## 2013-09-20 DIAGNOSIS — L03119 Cellulitis of unspecified part of limb: Secondary | ICD-10-CM

## 2013-09-20 DIAGNOSIS — L98499 Non-pressure chronic ulcer of skin of other sites with unspecified severity: Secondary | ICD-10-CM

## 2013-09-20 DIAGNOSIS — M726 Necrotizing fasciitis: Secondary | ICD-10-CM

## 2013-09-20 DIAGNOSIS — I739 Peripheral vascular disease, unspecified: Secondary | ICD-10-CM

## 2013-09-20 DIAGNOSIS — S88119A Complete traumatic amputation at level between knee and ankle, unspecified lower leg, initial encounter: Secondary | ICD-10-CM

## 2013-09-20 LAB — RETICULOCYTES
RBC.: 2.64 MIL/uL — AB (ref 4.22–5.81)
Retic Count, Absolute: 89.8 10*3/uL (ref 19.0–186.0)
Retic Ct Pct: 3.4 % — ABNORMAL HIGH (ref 0.4–3.1)

## 2013-09-20 LAB — GLUCOSE, CAPILLARY
GLUCOSE-CAPILLARY: 160 mg/dL — AB (ref 70–99)
GLUCOSE-CAPILLARY: 130 mg/dL — AB (ref 70–99)
Glucose-Capillary: 179 mg/dL — ABNORMAL HIGH (ref 70–99)
Glucose-Capillary: 147 mg/dL — ABNORMAL HIGH (ref 70–99)

## 2013-09-20 LAB — IRON AND TIBC
Iron: 35 ug/dL — ABNORMAL LOW (ref 42–135)
Saturation Ratios: 15 % — ABNORMAL LOW (ref 20–55)
TIBC: 228 ug/dL (ref 215–435)
UIBC: 193 ug/dL (ref 125–400)

## 2013-09-20 LAB — VITAMIN B12: Vitamin B-12: 422 pg/mL (ref 211–911)

## 2013-09-20 LAB — FOLATE: FOLATE: 18.6 ng/mL

## 2013-09-20 LAB — FERRITIN: FERRITIN: 2143 ng/mL — AB (ref 22–322)

## 2013-09-20 NOTE — Consult Note (Signed)
Subjective:   HPI  The patient is a 58 year old male who we are asked to see in regards to anemia. He has been newly diagnosed with diabetes mellitus. He developed gangrene of the left foot and recently underwent below the knee amputation. The patient was unaware that he even had diabetes before he came to the hospital. We are asked to see him in regards to anemia his hemoglobin as of last count was 7.7. It is a normocytic normochromic anemia. The serum ferritin is 2143. He denies abdominal pain, heartburn, hematemesis or melena. He denies rectal bleeding. Stools were checked a few days ago and were Hemoccult negative.  Review of Systems Denies chest pain or shortness of breath  Past Medical History  Diagnosis Date  . Hepatitis   . Hypertension   . Pneumonia     HX OF PNA  . Diabetes mellitus without complication     PATIENT JUST LEARNED HE WAS DIABETIC  . Necrotizing fasciitis   . Hyponatremia   . Hypokalemia    Past Surgical History  Procedure Laterality Date  . Cholecystectomy    . I&d extremity Left 09/03/2013    Procedure: IRRIGATION AND DEBRIDEMENT EXTREMITY;  Surgeon: Marianna Payment, MD;  Location: [facility];  Service: Orthopedics;  Laterality: Left;  . I&d extremity Left 09/11/2013    Procedure: LEFT FOOT IRRIGATION AND DEBRIDEMENT;  Surgeon: Marianna Payment, MD;  Location: [facility];  Service: Orthopedics;  Laterality: Left;  . Amputation Left 09/11/2013    Procedure: AMPUTATION BELOW KNEE;  Surgeon: Marianna Payment, MD;  Location: [facility];  Service: Orthopedics;  Laterality: Left;   History   Social History  . Marital Status: Single    Spouse Name: N/A    Number of Children: N/A  . Years of Education: N/A   Occupational History  . Not on file.   Social History Main Topics  . Smoking status: Never Smoker   . Smokeless tobacco: Never Used  . Alcohol Use: No  . Drug Use: No  . Sexual Activity: Not on file   Other Topics Concern  . Not on file   Social History  Narrative  . No narrative on file   family history includes Dementia in his mother; Diabetes type II in his mother; Heart disease in his father. Current facility-administered medications:0.45 % sodium chloride infusion, , Intravenous, Continuous, Charlett Blake, MD;  acetaminophen (TYLENOL) tablet 325-650 mg, 325-650 mg, Oral, Q4H PRN, Bary Leriche, PA-C, 650 mg at 09/17/13 0901;  alum & mag hydroxide-simeth (MAALOX/MYLANTA) 200-200-20 MG/5ML suspension 30 mL, 30 mL, Oral, Q4H PRN, Bary Leriche, PA-C aspirin EC tablet 325 mg, 325 mg, Oral, Daily, Bary Leriche, PA-C, 325 mg at 09/20/13 [id number];  bisacodyl (DULCOLAX) suppository 10 mg, 10 mg, Rectal, Daily PRN, Ivan Anchors Love, PA-C;  diphenhydrAMINE (BENADRYL) 12.5 MG/5ML elixir 12.5-25 mg, 12.5-25 mg, Oral, Q6H PRN, Ivan Anchors Love, PA-C;  enoxaparin (LOVENOX) injection 40 mg, 40 mg, Subcutaneous, Q24H, Pamela S Love, PA-C, 40 mg at 09/19/13 1705 guaiFENesin-dextromethorphan (ROBITUSSIN DM) 100-10 MG/5ML syrup 5-10 mL, 5-10 mL, Oral, Q6H PRN, Ivan Anchors Love, PA-C;  insulin aspart (novoLOG) injection 0-20 Units, 0-20 Units, Subcutaneous, TID WC, Bary Leriche, PA-C, 3 Units at 09/20/13 1219;  insulin aspart (novoLOG) injection 0-5 Units, 0-5 Units, Subcutaneous, QHS, Pamela S Love, PA-C insulin aspart protamine- aspart (NOVOLOG MIX 70/30) injection 15 Units, 15 Units, Subcutaneous, BID WC, Charlett Blake, MD, 15 Units at 09/20/13 0743;  methocarbamol (ROBAXIN) tablet  500 mg, 500 mg, Oral, Q6H PRN, Ivan Anchors Love, PA-C;  multivitamin with minerals tablet 1 tablet, 1 tablet, Oral, Daily, Erlene Quan, RD, 1 tablet at 09/20/13 [id number] prochlorperazine (COMPAZINE) injection 5-10 mg, 5-10 mg, Intramuscular, Q6H PRN, Bary Leriche, PA-C, 10 mg at 09/19/13 [id number];  prochlorperazine (COMPAZINE) suppository 12.5 mg, 12.5 mg, Rectal, Q6H PRN, Ivan Anchors Love, PA-C;  prochlorperazine (COMPAZINE) tablet 5-10 mg, 5-10 mg, Oral, Q6H PRN, Pamela S Love, PA-C;  protein  supplement (RESOURCE BENEPROTEIN) powder 6 g, 1 scoop, Oral, TID WC, Pamela S Love, PA-C, 6 g at 09/20/13 1219 senna-docusate (Senokot-S) tablet 1 tablet, 1 tablet, Oral, QHS PRN, Ivan Anchors Love, PA-C;  traZODone (DESYREL) tablet 25-50 mg, 25-50 mg, Oral, QHS PRN, Bary Leriche, PA-C Allergies  Allergen Reactions  . Tramadol Nausea And Vomiting     Objective:     BP 139/66  Pulse 115  Temp(Src) 98.1 F (36.7 C) (Oral)  Resp 17  Ht 5\' 3"  (1.6 m)  Wt 65.1 kg (143 lb 8.3 oz)  BMI 25.43 kg/m2  SpO2 90%  He is in no distress  Nonicteric  Heart regular rhythm no murmurs  Lungs clear  Abdomen: Soft, nontender, no obvious hepatosplenomegaly  Laboratory No components found with this basename: d1      Assessment:     Anemia. I suspect that this is an anemia of chronic disease.  Stools for Hemoccult negative.      Plan:     Although he has had a gradual drop in hemoglobin as noted, there is no obvious evidence of gastrointestinal bleeding. It might be reasonable to ask hematology to see him for their input. Lab Results  Component Value Date   HGB 7.7* 09/19/2013   HGB 8.1* 09/16/2013   HGB 8.4* 09/15/2013   HCT 23.8* 09/19/2013   HCT 24.5* 09/16/2013   HCT 25.2* 09/15/2013   ALKPHOS 74 09/14/2013   ALKPHOS 114 09/06/2013   ALKPHOS 148* 09/02/2013   AST 16 09/14/2013   AST 16 09/06/2013   AST 12 09/02/2013   ALT 11 09/14/2013   ALT 14 09/06/2013   ALT 9 09/02/2013

## 2013-09-20 NOTE — Progress Notes (Addendum)
Physical Therapy Weekly Progress Note  Patient Details  Name: Fabio Wah MRN: [id number] Date of Birth: 03-05-1956  Today's Date: 09/20/2013 Time: 7893-8101 and 7510-2585 Time Calculation (min): 54 min and 42 min  Patient has made good progress towards and has met 2 of 6 long term goals.  Short term goals not set due to estimated length of stay.  Pt is currently mod I bed mobility, supervision basic transfers squat pivot and stand pivot with RW, supervision w/c mobility secondary to intermittent cues needed for set up, and min A gait with RW.  Secondary to lack of safe D/C destination pt is now awaiting NHP and goals have been discontinued or downgraded to reflect new D/C plan.  Pt participation in therapy the last two days has been limited by nausea, vomiting and low hemoglobin and hematocrit levels limiting activity tolerance and endurance.  Patient continues to demonstrate the following deficits: bilat UE and LE weakness, impaired activity tolerance and endurance, impaired balance and gait and therefore will continue to benefit from skilled PT intervention to enhance overall performance with activity tolerance, balance, ability to compensate for deficits and functional use of  right upper extremity, right lower extremity and left upper extremity for gait.  See Patient's Care Plan for progression toward long term goals.  Patient progressing toward long term goals..  Plan of care revisions: goals downgraded or discontinued to reflect new D/C plan of NHP.  Skilled Therapeutic Interventions/Progress Updates:   Pt resting in w/c; pt denies nausea, vomiting, pain or lightheadedness today.  Pt fatigued but agreeable to therapy.  Performed mod I w/c mobility to/from gym with pt performing all set up for transfers independently.  Performed transfers w/c <> mat squat pivot supervision and sit <> prone<>L and R sidelying mod I.  Performed LE strengthening and PROM in prone and sidelying.  Performed  prolonged prone x 30 minutes for hip flexion stretching to reduce risk of contracture from prolonged sitting.  While in prone performed 2 sets x 12 reps each hamstring curls and straight leg extensions.  Performed R and L sidelying open chain hip ABD 2 sets x 12 reps.  Also transitioned to quadruped for short period of time to perform core/trunk control training during 5 reps LLE hip extension kick backs and alternating UE flexion.  No pain reported during exercises.  Returned to prone to rest.  Returned to w/c and to room to transfer w/c > bed and sit > supine mod I.    2nd session: pt resting in bed, no c/o pain.   Upon arising to EOB pt reporting dizziness and posterior LOB but recovered quickly.  Donned shoe and transferred bed > w/c supervision.  Performed w/c mobility x 100' x 2 mod I.  Performed transfers w/c > mat squat pivot supervision, mat > w/c stand pivot with RW min A.  Performed dynamic standing balance, endurance training standing with alternating UE support on RW while other UE performed reaching out of BOS in various directions and across midline for bean bags and then performing bean bag toss.  2 sitting rest breaks due to fatigue.  One episode of feeling lightheaded; vitals assessed, all WFL except HR elevated: 115 bpm.  Returned to room and to EOB with supervision to rest until supper.    Therapy Documentation Precautions:  Precautions Precautions: Fall Precaution Comments:   Required Braces or Orthoses: Other Brace/Splint Other Brace/Splint: R DARCO shoe when OOB Restrictions Weight Bearing Restrictions: Yes LLE Weight Bearing: Non weight bearing Other  Position/Activity Restrictions: keep L knee straight/elevated when in w/c, bed Pain: Pain Assessment Pain Assessment: No/denies pain Locomotion : Wheelchair Mobility Distance: 150   See FIM for current functional status  Therapy/Group: Individual Therapy  Raylene Everts [facility] 09/20/2013, 2:51 PM

## 2013-09-20 NOTE — Progress Notes (Signed)
Occupational Therapy Weekly Progress Note  Patient Details  Name: [name] MRN: [id number] Date of Birth: 1955/08/12  Today's Date: 09/20/2013 Time: 0830-0925 and 1300-1330 Time Calculation (min): 55 min and 30 min  Patient has met 0 of 8 long term goals.  Short term goals not set due to estimated length of stay.  Pt continues to require min/steady assist with dynamic standing with self-care tasks.  Supervision with w/c mobility and transfers from w/c level.  Pt's previous living situation is unsafe for him to return to as well as pt will require further medical attention due to recent Lt BKA and Rt foot wounds.  NHP being pursued to allow for further medical management.   Patient continues to demonstrate the following deficits: decreased balance reactions, impaired activity tolerance and therefore will continue to benefit from skilled OT intervention to enhance overall performance with BADL and Reduce care partner burden.  See Patient's Care Plan for progression toward long term goals.  Patient progressing toward long term goals..  Continue plan of care.  Skilled Therapeutic Interventions/Progress Updates:    1) Engaged in ADL retraining with focus on functional mobility with RW and standing tolerance during self-care tasks.  Pt ambulated with RW to bathroom for toileting and back to sink to complete bathing with close supervision.  Pt continues to utilize counter or chair to stabilize self in standing due to decreased balance and unlevel Darco shoe on Rt foot.  Pt required assist with donning Rt sock this session.  Pt propelled w/c to laundry room and stood to load washing machine with close supervision.  Pt returned to room and completed stand pivot to bed with close supervision.  2) Engaged in ADL retraining with focus on LB dressing and standing tolerance to complete grooming tasks at sink.  Pt in bed upon arrival, donned pants at EOB and pulled over hips in standing with UE support on RW.   Pt donned Darco shoe prior to pulling up pants with use of step stool.  Ambulated to sink where pt completed grooming tasks in standing with close supervision.  Pt demonstrating increased standing tolerance this session and reports feeling much better than yesterday.  Therapy Documentation Precautions:  Precautions Precautions: Fall Precaution Comments:   Required Braces or Orthoses: Other Brace/Splint Other Brace/Splint: R DARCO shoe when OOB Restrictions Weight Bearing Restrictions: Yes LLE Weight Bearing: Non weight bearing Other Position/Activity Restrictions: keep L knee straight/elevated when in w/c, bed Pain: Pain Assessment Pain Score: 0-No pain  See FIM for current functional status  Therapy/Group: Individual Therapy  [name] 09/20/2013, 10:29 AM

## 2013-09-20 NOTE — Progress Notes (Signed)
Subjective/Complaints: 58 y.o. male with history of diabetes mellitus, HTN, h/o hepatitis, BLE numbness since frostbite last year but no medical care. He has been living in his car for past 4-5 years (had declined opportunity to live with family) who was brought to ED on 09/01/13 by family due to inability to care for self, confusion as well as left foot infection. X rays with extensive soft tissue gas consistent with cellulitis and abscess formation. He was found to have diabetic ketoacidosis and treated with insulin infusion. He was evaluated by Dr. Erlinda Hong and placed on IV antibiotics. His foot culture positive for proteus mirabilis--pan sensitive. MRI with for concern for necrotizing fascitis and patient taken to OR for I & D with 4 th and 5th toe amputation with placement of VAC. Also noted to have necrotic 1st, 2nd and 5 th toes on the right and likey allow auto amputation per ortho. He had poor healing of left foot and underwent transtibial amputation 09/11/2013   Nausea and vomiting this morning. No abdominal pain. Had a large continent stool yesterday. Only had juice today however did have mixed fruit in his vomited material this morning   Review of Systems - negative except for vomiting and feeling light head  Objective: Vital Signs: Blood pressure 133/76, pulse 85, temperature 97.8 F (36.6 C), temperature source Oral, resp. rate 18, height $RemoveBe'5\' 3"'WevvRsNjz$  (1.6 m), weight 65.1 kg (143 lb 8.3 oz), SpO2 94.00%. Dg Abd 1 View  09/19/2013   CLINICAL DATA:  58 year old male nausea vomiting loose stool. Recent left lower extremity surgery.  EXAM: ABDOMEN - 1 VIEW  COMPARISON:  None.  FINDINGS: Single supine view at 1329 hrs. Large body habitus, the entire abdomen is not visible. Non obstructed bowel gas pattern. Right upper quadrant surgical clips. Visible abdominal and pelvic visceral contours are within normal limits. No acute osseous abnormality identified.  IMPRESSION: Non obstructed bowel gas pattern.    Electronically Signed   By: Lars Pinks M.D.   On: 09/19/2013 14:34   Results for orders placed during the hospital encounter of 09/13/13 (from the past 72 hour(s))  GLUCOSE, CAPILLARY     Status: Abnormal   Collection Time    09/17/13 11:22 AM      Result Value Ref Range   Glucose-Capillary 166 (*) 70 - 99 mg/dL   Comment 1 Notify RN    GLUCOSE, CAPILLARY     Status: Abnormal   Collection Time    09/17/13  4:25 PM      Result Value Ref Range   Glucose-Capillary 189 (*) 70 - 99 mg/dL   Comment 1 Notify RN    GLUCOSE, CAPILLARY     Status: Abnormal   Collection Time    09/17/13  9:13 PM      Result Value Ref Range   Glucose-Capillary 169 (*) 70 - 99 mg/dL  GLUCOSE, CAPILLARY     Status: Abnormal   Collection Time    09/18/13  7:20 AM      Result Value Ref Range   Glucose-Capillary 174 (*) 70 - 99 mg/dL   Comment 1 Notify RN    OCCULT BLOOD X 1 CARD TO LAB, STOOL     Status: None   Collection Time    09/18/13  9:14 AM      Result Value Ref Range   Fecal Occult Bld NEGATIVE  NEGATIVE  BASIC METABOLIC PANEL     Status: Abnormal   Collection Time    09/18/13 11:13 AM  Result Value Ref Range   Sodium 135 (*) 137 - 147 mEq/L   Potassium 3.9  3.7 - 5.3 mEq/L   Chloride 97  96 - 112 mEq/L   CO2 22  19 - 32 mEq/L   Glucose, Bld 149 (*) 70 - 99 mg/dL   BUN 13  6 - 23 mg/dL   Creatinine, Ser 1.03  0.50 - 1.35 mg/dL   Calcium 9.3  8.4 - 10.5 mg/dL   GFR calc non Af Amer 80 (*) >90 mL/min   GFR calc Af Amer >90  >90 mL/min   Comment: (NOTE)     The eGFR has been calculated using the CKD EPI equation.     This calculation has not been validated in all clinical situations.     eGFR's persistently <90 mL/min signify possible Chronic Kidney     Disease.  GLUCOSE, CAPILLARY     Status: Abnormal   Collection Time    09/18/13 11:48 AM      Result Value Ref Range   Glucose-Capillary 136 (*) 70 - 99 mg/dL   Comment 1 Notify RN    GLUCOSE, CAPILLARY     Status: Abnormal    Collection Time    09/18/13  4:04 PM      Result Value Ref Range   Glucose-Capillary 151 (*) 70 - 99 mg/dL   Comment 1 Notify RN    GLUCOSE, CAPILLARY     Status: Abnormal   Collection Time    09/18/13  8:58 PM      Result Value Ref Range   Glucose-Capillary 159 (*) 70 - 99 mg/dL  GLUCOSE, CAPILLARY     Status: Abnormal   Collection Time    09/19/13  7:30 AM      Result Value Ref Range   Glucose-Capillary 200 (*) 70 - 99 mg/dL   Comment 1 Notify RN    GLUCOSE, CAPILLARY     Status: Abnormal   Collection Time    09/19/13  8:05 AM      Result Value Ref Range   Glucose-Capillary 230 (*) 70 - 99 mg/dL  GLUCOSE, CAPILLARY     Status: Abnormal   Collection Time    09/19/13 11:15 AM      Result Value Ref Range   Glucose-Capillary 181 (*) 70 - 99 mg/dL   Comment 1 Notify RN    CBC WITH DIFFERENTIAL     Status: Abnormal   Collection Time    09/19/13 11:22 AM      Result Value Ref Range   WBC 8.5  4.0 - 10.5 K/uL   RBC 2.69 (*) 4.22 - 5.81 MIL/uL   Hemoglobin 7.7 (*) 13.0 - 17.0 g/dL   HCT 23.8 (*) 39.0 - 52.0 %   MCV 88.5  78.0 - 100.0 fL   MCH 28.6  26.0 - 34.0 pg   MCHC 32.4  30.0 - 36.0 g/dL   RDW 14.0  11.5 - 15.5 %   Platelets 318  150 - 400 K/uL   Neutrophils Relative % 85 (*) 43 - 77 %   Neutro Abs 7.3  1.7 - 7.7 K/uL   Lymphocytes Relative 10 (*) 12 - 46 %   Lymphs Abs 0.8  0.7 - 4.0 K/uL   Monocytes Relative 5  3 - 12 %   Monocytes Absolute 0.4  0.1 - 1.0 K/uL   Eosinophils Relative 0  0 - 5 %   Eosinophils Absolute 0.0  0.0 - 0.7 K/uL  Basophils Relative 0  0 - 1 %   Basophils Absolute 0.0  0.0 - 0.1 K/uL  BASIC METABOLIC PANEL     Status: Abnormal   Collection Time    09/19/13 11:22 AM      Result Value Ref Range   Sodium 143  137 - 147 mEq/L   Potassium 4.4  3.7 - 5.3 mEq/L   Chloride 100  96 - 112 mEq/L   CO2 28  19 - 32 mEq/L   Glucose, Bld 190 (*) 70 - 99 mg/dL   BUN 19  6 - 23 mg/dL   Creatinine, Ser 1.13  0.50 - 1.35 mg/dL   Calcium 9.5  8.4 -  10.5 mg/dL   GFR calc non Af Amer 71 (*) >90 mL/min   GFR calc Af Amer 83 (*) >90 mL/min   Comment: (NOTE)     The eGFR has been calculated using the CKD EPI equation.     This calculation has not been validated in all clinical situations.     eGFR's persistently <90 mL/min signify possible Chronic Kidney     Disease.  GLUCOSE, CAPILLARY     Status: Abnormal   Collection Time    09/19/13  5:00 PM      Result Value Ref Range   Glucose-Capillary 142 (*) 70 - 99 mg/dL   Comment 1 Notify RN    GLUCOSE, CAPILLARY     Status: Abnormal   Collection Time    09/19/13  9:09 PM      Result Value Ref Range   Glucose-Capillary 160 (*) 70 - 99 mg/dL  GLUCOSE, CAPILLARY     Status: Abnormal   Collection Time    09/20/13  7:24 AM      Result Value Ref Range   Glucose-Capillary 179 (*) 70 - 99 mg/dL   Comment 1 Notify RN        HENT: dentition poor  Head: Normocephalic.  Eyes: EOM are normal.  Neck: Normal range of motion. Neck supple. No thyromegaly present.  Cardiovascular: Normal rate and regular rhythm. No murmurs  Respiratory: Effort normal and breath sounds normal. No respiratory distress.  GI: Soft. Bowel sounds are normal. He exhibits no distension.  Musculoskeletal:   Shoulder , elbow, wrist And hand ROM normal  Neurological: He is alert and oriented to person, place, and time. A sensory deficit is present.  5/5 strength Bilateral delt, bi, tri, grip,  4/5 Bilateral HF and R KE  3- R ankle DF/PF  LLE hip anti gravity R Foot unchanged necrotic 1st, 2nd, 3rd toes no surrounding erythema  Ext no calf swelling or tenderness  Assessment/Plan: 1. Functional deficits secondary to Left BKA for gangrene (diabetic neuropathy, undiagnosed DM, no medical care) which require 3+ hours per day of interdisciplinary therapy in a comprehensive inpatient rehab setting. Physiatrist is providing close team supervision and 24 hour management of active medical problems listed below. Physiatrist  and rehab team continue to assess barriers to discharge/monitor patient progress toward functional and medical goals.  FIM: FIM - Bathing Bathing Steps Patient Completed: Chest;Right Arm;Left Arm;Abdomen;Front perineal area;Buttocks;Right upper leg;Left upper leg Bathing: 4: Min-Patient completes 8-9 71f 10 parts or 75+ percent  FIM - Upper Body Dressing/Undressing Upper body dressing/undressing: 0: Wears gown/pajamas-no public clothing FIM - Lower Body Dressing/Undressing Lower body dressing/undressing steps patient completed: Don/Doff right sock;Don/Doff right shoe;Fasten/unfasten right shoe;Pull pants up/down Lower body dressing/undressing: 5: Supervision: Safety issues/verbal cues  FIM - Toileting Toileting steps completed by patient: Performs  perineal hygiene Toileting: 4: Steadying assist  FIM - Radio producer Devices: Elevated toilet seat;Grab bars Toilet Transfers: 5-To toilet/BSC: Supervision (verbal cues/safety issues);4-From toilet/BSC: Min A (steadying Pt. > 75%)  FIM - Bed/Chair Transfer Bed/Chair Transfer Assistive Devices: Arm rests;Walker Bed/Chair Transfer: 5: Supine > Sit: Supervision (verbal cues/safety issues);5: Sit > Supine: Supervision (verbal cues/safety issues);5: Bed > Chair or W/C: Supervision (verbal cues/safety issues);5: Chair or W/C > Bed: Supervision (verbal cues/safety issues)  FIM - Locomotion: Wheelchair Distance: 150 Locomotion: Wheelchair: 5: Travels 150 ft or more: maneuvers on rugs and over door sills with supervision, cueing or coaxing FIM - Locomotion: Ambulation Locomotion: Ambulation Assistive Devices: Administrator Ambulation/Gait Assistance: 4: Min assist Locomotion: Ambulation: 1: Travels less than 50 ft with minimal assistance (Pt.>75%)  Comprehension Comprehension Mode: Auditory Comprehension: 6-Follows complex conversation/direction: With extra time/assistive device  Expression Expression Mode:  Verbal Expression: 6-Expresses complex ideas: With extra time/assistive device  Social Interaction Social Interaction: 6-Interacts appropriately with others with medication or extra time (anti-anxiety, antidepressant).  Problem Solving Problem Solving: 6-Solves complex problems: With extra time  Memory Memory: 6-More than reasonable amt of time  Medical Problem List and Plan:  Left BKA, and R necrotic toes  1. DVT Prophylaxis/Anticoagulation: Add SQ lovenox. Check RLE dopplers  2. Pain Management: Prn oxcycodone.  3. Mood: Does not express any signs of distress or concerns. LCSW to follow for evaluation.  4. Neuropsych: This patient is capable of making decisions on his own behalf.  5. DM type 2 poorly controlled: Monitor BS with ac/hs checks. Will change lantus to 70/30 insulin as patient without insurance.titrate up Use SSI for elevated BS and titrate as indicated. Question compliance past discharge.  6. Recurrent fever: resolved off abx  7. Anemia , guaic - x 1 neg, has had no medical care until this hospitalization, ask GI to eval since Hgb dropped below 8, POD#9 no bleeding from incision 8. Vomiting, resolved may be related to tramadolLOS (Days) 7 A FACE TO FACE EVALUATION WAS PERFORMED  KIRSTEINS,ANDREW E 09/20/2013, 8:07 AM

## 2013-09-21 ENCOUNTER — Inpatient Hospital Stay (HOSPITAL_COMMUNITY): Payer: Medicaid Other | Admitting: *Deleted

## 2013-09-21 DIAGNOSIS — L98499 Non-pressure chronic ulcer of skin of other sites with unspecified severity: Secondary | ICD-10-CM

## 2013-09-21 DIAGNOSIS — L02619 Cutaneous abscess of unspecified foot: Secondary | ICD-10-CM

## 2013-09-21 DIAGNOSIS — E1310 Other specified diabetes mellitus with ketoacidosis without coma: Secondary | ICD-10-CM

## 2013-09-21 DIAGNOSIS — S88119A Complete traumatic amputation at level between knee and ankle, unspecified lower leg, initial encounter: Secondary | ICD-10-CM

## 2013-09-21 DIAGNOSIS — L03119 Cellulitis of unspecified part of limb: Secondary | ICD-10-CM

## 2013-09-21 DIAGNOSIS — M726 Necrotizing fasciitis: Secondary | ICD-10-CM

## 2013-09-21 DIAGNOSIS — I739 Peripheral vascular disease, unspecified: Secondary | ICD-10-CM

## 2013-09-21 LAB — CULTURE, BLOOD (ROUTINE X 2)
CULTURE: NO GROWTH
CULTURE: NO GROWTH

## 2013-09-21 LAB — GLUCOSE, CAPILLARY
GLUCOSE-CAPILLARY: 155 mg/dL — AB (ref 70–99)
Glucose-Capillary: 148 mg/dL — ABNORMAL HIGH (ref 70–99)
Glucose-Capillary: 114 mg/dL — ABNORMAL HIGH (ref 70–99)
Glucose-Capillary: 158 mg/dL — ABNORMAL HIGH (ref 70–99)

## 2013-09-21 NOTE — Progress Notes (Addendum)
Occupational Therapy Note  Patient Details  Name: [name] MRN: [id number] Date of Birth: Jan 18, 1956 Today's Date: 09/21/2013  Time: 1545-1630   (45 min) Pain:  5/10  Phantom pain Left leg Individual session  Engaged in bathing and dressing at sink level.  Addressed sit to stand, standing balance,, transfers.  Pt. Stood with close supervision with Upper body resting on counter top.  He stood for 2-3 minutes.  Propelled wc to bed and performed hair grooming.  Transferred back to bed with SBA going to left.  Left in bed with call bell in place.   Oxygen= 97 % on room air.        Lisa Roca 09/21/2013, 4:29 PM

## 2013-09-21 NOTE — Progress Notes (Signed)
Subjective/Complaints: 58 y.o. male with history of diabetes mellitus, HTN, h/o hepatitis, BLE numbness since frostbite last year but no medical care. He has been living in his car for past 4-5 years (had declined opportunity to live with family) who was brought to ED on 09/01/13 by family due to inability to care for self, confusion as well as left foot infection. X rays with extensive soft tissue gas consistent with cellulitis and abscess formation. He was found to have diabetic ketoacidosis and treated with insulin infusion. He was evaluated by Dr. Erlinda Hong and placed on IV antibiotics. His foot culture positive for proteus mirabilis--pan sensitive. MRI with for concern for necrotizing fascitis and patient taken to OR for I & D with 4 th and 5th toe amputation with placement of VAC. Also noted to have necrotic 1st, 2nd and 5 th toes on the right and likey allow auto amputation per ortho. 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 rate 18, height _0  (1.6 m), weight 65.1 kg (143 lb 8.3 oz), SpO2 90.00%. Dg Abd 1 View  09/19/2013   CLINICAL DATA:  58 year old male nausea vomiting loose stool. Recent left lower extremity surgery.  EXAM: ABDOMEN - 1 VIEW  COMPARISON:  None.  FINDINGS: Single supine view at 1329 hrs. Large body habitus, the entire abdomen is not visible. Non obstructed bowel gas pattern. Right upper quadrant surgical clips. Visible abdominal and pelvic visceral contours are within normal limits. No acute osseous abnormality identified.  IMPRESSION: Non obstructed bowel gas pattern.   Electronically Signed   By: Lars Pinks M.D.   On: 09/19/2013 14:34   Results for orders placed during the hospital encounter of 09/13/13 (from the past 72 hour(s))   OCCULT BLOOD X 1 CARD TO LAB, STOOL     Status: None   Collection Time    09/18/13  9:14 AM      Result Value Ref Range   Fecal Occult Bld NEGATIVE  NEGATIVE  BASIC METABOLIC PANEL     Status: Abnormal   Collection Time    09/18/13 11:13 AM      Result Value Ref Range   Sodium 135 (*) 137 - 147 mEq/L   Potassium 3.9  3.7 - 5.3 mEq/L   Chloride 97  96 - 112 mEq/L   CO2 22  19 - 32 mEq/L   Glucose, Bld 149 (*) 70 - 99 mg/dL   BUN 13  6 - 23 mg/dL   Creatinine, Ser 1.03  0.50 - 1.35 mg/dL   Calcium 9.3  8.4 - 10.5 mg/dL   GFR calc non Af Amer 80 (*) >90 mL/min   GFR calc Af Amer >90  >90 mL/min   Comment: (NOTE)     The eGFR has been calculated using the CKD EPI equation.     This calculation has not been validated in all clinical situations.     eGFR's persistently <90 mL/min signify possible Chronic Kidney     Disease.  GLUCOSE, CAPILLARY     Status: Abnormal   Collection Time    09/18/13 11:48 AM      Result Value Ref Range   Glucose-Capillary 136 (*) 70 - 99 mg/dL   Comment 1 Notify RN    GLUCOSE, CAPILLARY  Status: Abnormal   Collection Time    09/18/13  4:04 PM      Result Value Ref Range   Glucose-Capillary 151 (*) 70 - 99 mg/dL   Comment 1 Notify RN    GLUCOSE, CAPILLARY     Status: Abnormal   Collection Time    09/18/13  8:58 PM      Result Value Ref Range   Glucose-Capillary 159 (*) 70 - 99 mg/dL  GLUCOSE, CAPILLARY     Status: Abnormal   Collection Time    09/19/13  7:30 AM      Result Value Ref Range   Glucose-Capillary 200 (*) 70 - 99 mg/dL   Comment 1 Notify RN    GLUCOSE, CAPILLARY     Status: Abnormal   Collection Time    09/19/13  8:05 AM      Result Value Ref Range   Glucose-Capillary 230 (*) 70 - 99 mg/dL  GLUCOSE, CAPILLARY     Status: Abnormal   Collection Time    09/19/13 11:15 AM      Result Value Ref Range   Glucose-Capillary 181 (*) 70 - 99 mg/dL   Comment 1 Notify RN    CBC WITH DIFFERENTIAL     Status: Abnormal   Collection Time     09/19/13 11:22 AM      Result Value Ref Range   WBC 8.5  4.0 - 10.5 K/uL   RBC 2.69 (*) 4.22 - 5.81 MIL/uL   Hemoglobin 7.7 (*) 13.0 - 17.0 g/dL   HCT 23.8 (*) 39.0 - 52.0 %   MCV 88.5  78.0 - 100.0 fL   MCH 28.6  26.0 - 34.0 pg   MCHC 32.4  30.0 - 36.0 g/dL   RDW 14.0  11.5 - 15.5 %   Platelets 318  150 - 400 K/uL   Neutrophils Relative % 85 (*) 43 - 77 %   Neutro Abs 7.3  1.7 - 7.7 K/uL   Lymphocytes Relative 10 (*) 12 - 46 %   Lymphs Abs 0.8  0.7 - 4.0 K/uL   Monocytes Relative 5  3 - 12 %   Monocytes Absolute 0.4  0.1 - 1.0 K/uL   Eosinophils Relative 0  0 - 5 %   Eosinophils Absolute 0.0  0.0 - 0.7 K/uL   Basophils Relative 0  0 - 1 %   Basophils Absolute 0.0  0.0 - 0.1 K/uL  BASIC METABOLIC PANEL     Status: Abnormal   Collection Time    09/19/13 11:22 AM      Result Value Ref Range   Sodium 143  137 - 147 mEq/L   Potassium 4.4  3.7 - 5.3 mEq/L   Chloride 100  96 - 112 mEq/L   CO2 28  19 - 32 mEq/L   Glucose, Bld 190 (*) 70 - 99 mg/dL   BUN 19  6 - 23 mg/dL   Creatinine, Ser 1.13  0.50 - 1.35 mg/dL   Calcium 9.5  8.4 - 10.5 mg/dL   GFR calc non Af Amer 71 (*) >90 mL/min   GFR calc Af Amer 83 (*) >90 mL/min   Comment: (NOTE)     The eGFR has been calculated using the CKD EPI equation.     This calculation has not been validated in all clinical situations.     eGFR's persistently <90 mL/min signify possible Chronic Kidney     Disease.  GLUCOSE, CAPILLARY     Status: Abnormal  Collection Time    09/19/13  5:00 PM      Result Value Ref Range   Glucose-Capillary 142 (*) 70 - 99 mg/dL   Comment 1 Notify RN    GLUCOSE, CAPILLARY     Status: Abnormal   Collection Time    09/19/13  9:09 PM      Result Value Ref Range   Glucose-Capillary 160 (*) 70 - 99 mg/dL  GLUCOSE, CAPILLARY     Status: Abnormal   Collection Time    09/20/13  7:24 AM      Result Value Ref Range   Glucose-Capillary 179 (*) 70 - 99 mg/dL   Comment 1 Notify RN    FERRITIN     Status:  Abnormal   Collection Time    09/20/13  9:42 AM      Result Value Ref Range   Ferritin 2143 (*) 22 - 322 ng/mL   Comment: Result confirmed by automatic dilution.     Performed at [facility]     Status: None   Collection Time    09/20/13  9:42 AM      Result Value Ref Range   Folate 18.6     Comment: (NOTE)     Reference Ranges            Deficient:       0.4 - 3.3 ng/mL            Indeterminate:   3.4 - 5.4 ng/mL            Normal:              > 5.4 ng/mL     Performed at [facility]  IRON AND TIBC     Status: Abnormal   Collection Time    09/20/13  9:42 AM      Result Value Ref Range   Iron 35 (*) 42 - 135 ug/dL   TIBC 228  215 - 435 ug/dL   Saturation Ratios 15 (*) 20 - 55 %   UIBC 193  125 - 400 ug/dL   Comment: Performed at [facility]     Status: Abnormal   Collection Time    09/20/13  9:42 AM      Result Value Ref Range   Retic Ct Pct 3.4 (*) 0.4 - 3.1 %   RBC. 2.64 (*) 4.22 - 5.81 MIL/uL   Retic Count, Manual 89.8  19.0 - 186.0 K/uL  VITAMIN B12     Status: None   Collection Time    09/20/13  9:42 AM      Result Value Ref Range   Vitamin B-12 422  211 - 911 pg/mL   Comment: Performed at [place], CAPILLARY     Status: Abnormal   Collection Time    09/20/13 11:30 AM      Result Value Ref Range   Glucose-Capillary 147 (*) 70 - 99 mg/dL   Comment 1 Notify RN    GLUCOSE, CAPILLARY     Status: Abnormal   Collection Time    09/20/13  4:29 PM      Result Value Ref Range   Glucose-Capillary 160 (*) 70 - 99 mg/dL   Comment 1 Notify RN    GLUCOSE, CAPILLARY     Status: Abnormal   Collection Time    09/20/13  8:42 PM      Result Value Ref Range   Glucose-Capillary 130 (*)  70 - 99 mg/dL   Comment 1 Notify RN    GLUCOSE, CAPILLARY     Status: Abnormal   Collection Time    09/21/13  7:13 AM      Result Value Ref Range   Glucose-Capillary 148 (*) 70 - 99 mg/dL   Comment 1 Notify RN         HENT: dentition poor  Head: Normocephalic.  Eyes: EOM are normal.  Neck: Normal range of motion. Neck supple. No thyromegaly present.  Cardiovascular: Normal rate and regular rhythm. No murmurs  Respiratory: Effort normal and breath sounds normal. No respiratory distress.  GI: Soft. Bowel sounds are normal. He exhibits no distension.  Musculoskeletal:   Shoulder , elbow, wrist And hand ROM normal  Neurological: He is alert and oriented to person, place, and time. A sensory deficit is present.  5/5 strength Bilateral delt, bi, tri, grip,  4/5 Bilateral HF and R KE  3- R ankle DF/PF  LLE hip anti gravity R Foot unchanged necrotic 1st, 2nd, 3rd toes no surrounding erythema. Left leg with serosang drainage  Ext no calf swelling or tenderness  Assessment/Plan: 1. Functional deficits secondary to Left BKA for gangrene (diabetic neuropathy, undiagnosed DM, no medical care) which require 3+ hours per day of interdisciplinary therapy in a comprehensive inpatient rehab setting. Physiatrist is providing close team supervision and 24 hour management of active medical problems listed below. Physiatrist and rehab team continue to assess barriers to discharge/monitor patient progress toward functional and medical goals.  FIM: FIM - Bathing Bathing Steps Patient Completed: Chest;Right Arm;Left Arm;Abdomen;Front perineal area;Buttocks;Right upper leg;Left upper leg Bathing: 4: Min-Patient completes 8-9 66f10 parts or 75+ percent  FIM - Upper Body Dressing/Undressing Upper body dressing/undressing: 0: Wears gown/pajamas-no public clothing FIM - Lower Body Dressing/Undressing Lower body dressing/undressing steps patient completed: Don/Doff right shoe;Fasten/unfasten right shoe;Pull pants up/down Lower body dressing/undressing: 4: Min-Patient completed 75 plus % of tasks  FIM - Toileting Toileting steps completed by patient: Adjust clothing prior to toileting;Performs perineal hygiene;Adjust  clothing after toileting Toileting: 5: Supervision: Safety issues/verbal cues  FIM - TAir cabin crewTransfers Assistive Devices: Elevated toilet seat;Grab bars;Walker Toilet Transfers: 5-To toilet/BSC: Supervision (verbal cues/safety issues);5-From toilet/BSC: Supervision (verbal cues/safety issues)  FIM - BEngineer, siteAssistive Devices: Arm rests Bed/Chair Transfer: 6: Supine > Sit: No assist;6: Sit > Supine: No assist;5: Bed > Chair or W/C: Supervision (verbal cues/safety issues);5: Chair or W/C > Bed: Supervision (verbal cues/safety issues)  FIM - Locomotion: Wheelchair Distance: 150 Locomotion: Wheelchair: 6: Travels 150 ft or more, turns around, maneuvers to table, bed or toilet, negotiates 3% grade: maneuvers on rugs and over door sills independently FIM - Locomotion: Ambulation Locomotion: Ambulation Assistive Devices: WAdministratorAmbulation/Gait Assistance: 4: Min assist Locomotion: Ambulation: 0: Activity did not occur  Comprehension Comprehension Mode: Auditory Comprehension: 6-Follows complex conversation/direction: With extra time/assistive device  Expression Expression Mode: Verbal Expression: 6-Expresses complex ideas: With extra time/assistive device  Social Interaction Social Interaction: 6-Interacts appropriately with others with medication or extra time (anti-anxiety, antidepressant).  Problem Solving Problem Solving: 6-Solves complex problems: With extra time  Memory Memory: 6-More than reasonable amt of time  Medical Problem List and Plan:  Left BKA, and R necrotic toes  1. DVT Prophylaxis/Anticoagulation:   SQ lovenox.  2. Pain Management: Prn oxcycodone.  3. Mood: Does not express any signs of distress or concerns. LCSW to follow for evaluation.  4. Neuropsych: This patient is capable of making decisions on  his own behalf.  5. DM type 2 poorly controlled: Monitor BS with ac/hs checks.   lantus changed to 70/30  insulin 15u bid with good results.  Use SSI for elevated BS and titrate as indicated.    6. Recurrent fever: resolved off abx, low grade yesterday  7. Anemia , guaic - x 1 neg, has had no medical care until this hospitalization, ask GI to eval since Hgb dropped below  8. Vomiting, resolved may be related to tramadolLOS (Days) 8 A FACE TO FACE EVALUATION WAS PERFORMED  [name] T 09/21/2013, 7:59 AM

## 2013-09-22 ENCOUNTER — Inpatient Hospital Stay (HOSPITAL_COMMUNITY): Payer: Medicaid Other | Admitting: Physical Therapy

## 2013-09-22 LAB — GLUCOSE, CAPILLARY
GLUCOSE-CAPILLARY: 198 mg/dL — AB (ref 70–99)
Glucose-Capillary: 170 mg/dL — ABNORMAL HIGH (ref 70–99)
Glucose-Capillary: 168 mg/dL — ABNORMAL HIGH (ref 70–99)
Glucose-Capillary: 133 mg/dL — ABNORMAL HIGH (ref 70–99)

## 2013-09-22 MED ORDER — FERROUS SULFATE 325 (65 FE) MG PO TABS
325.0000 mg | ORAL_TABLET | Freq: Two times a day (BID) | ORAL | Status: DC
  Administered 2013-09-22 – 2013-10-17 (×51): 325 mg via ORAL
  Filled 2013-09-22 (×55): qty 1

## 2013-09-22 NOTE — Progress Notes (Signed)
Physical Therapy Note  Patient Details  Name: Tavione Reigner MRN: [id number] Date of Birth: 04-16-1956 Today's Date: 09/22/2013  1500-1550 (50 minutes) individual Pain: 4/10 left BKA/premedicated Focus of treatment: Therapeutic exercises focused on AROM/strengthening left BKA; transfer training; stand tolerance Precautions: [employer] shoe right foot when up Treatment: Pt in bed upon arrival; supine to sit independent; transfer scoot to squat/pivot bed to wc SBA ; wc mobility 120 feet X 2 modified independent on level surfaces; transfer wc to mat SBA scoot to squat/pivot ; push up blocks UEs X 10; therapeutic exercises X 20 left BKA- sidelying hip abduction, sidelying hip flexion/extension, supine SLR; sit to stand to RW close SBA ; standing X 3 minutes RW SBA ; transfer stand/turn RW SBA ; returned to room with all needs within reach.    Ariana Cavenaugh,JIM 09/22/2013, 3:53 PM

## 2013-09-22 NOTE — Progress Notes (Signed)
Checking stool again for occult blood. If negative again would recommend a hematology consult in regards to the anemia.

## 2013-09-22 NOTE — Progress Notes (Signed)
Subjective/Complaints: 58 y.o. male with history of diabetes mellitus, HTN, h/o hepatitis, BLE numbness since frostbite last year but no medical care. He has been living in his car for past 4-5 years (had declined opportunity to live with family) who was brought to ED on 09/01/13 by family due to inability to care for self, confusion as well as left foot infection. X rays with extensive soft tissue gas consistent with cellulitis and abscess formation. He was found to have diabetic ketoacidosis and treated with insulin infusion. He was evaluated by Dr. Erlinda Hong and placed on IV antibiotics. His foot culture positive for proteus mirabilis--pan sensitive. MRI with for concern for necrotizing fascitis and patient taken to OR for I & D with 4 th and 5th toe amputation with placement of VAC. Also noted to have necrotic 1st, 2nd and 5 th toes on the right and likey allow auto amputation per ortho. 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 No new complaints   Review of Systems -    Objective: Vital Signs: Blood pressure 152/81, pulse 97, temperature 98.6 F (37 C), temperature source Oral, resp. rate 18, height $RemoveBe'5\' 3"'[id number]$  (1.6 m), weight 65.1 kg (143 lb 8.3 oz), SpO2 90.00%. No results found. Results for orders placed during the hospital encounter of 09/13/13 (from the past 72 hour(s))  GLUCOSE, CAPILLARY     Status: Abnormal   Collection Time    09/19/13 11:15 AM      Result Value Ref Range   Glucose-Capillary 181 (*) 70 - 99 mg/dL   Comment 1 Notify RN    CBC WITH DIFFERENTIAL     Status: Abnormal   Collection Time    09/19/13 11:22 AM      Result Value Ref Range   WBC 8.5  4.0 - 10.5 K/uL   RBC 2.69 (*) 4.22 - 5.81 MIL/uL   Hemoglobin 7.7 (*) 13.0 - 17.0 g/dL   HCT 23.8 (*) 39.0 - 52.0 %   MCV 88.5  78.0 - 100.0 fL   MCH 28.6  26.0 - 34.0 pg   MCHC 32.4  30.0 - 36.0 g/dL   RDW 14.0  11.5 - 15.5 %   Platelets 318  150 - 400 K/uL   Neutrophils Relative % 85 (*) 43 - 77 %   Neutro Abs 7.3  1.7 - 7.7 K/uL   Lymphocytes Relative 10 (*) 12 - 46 %   Lymphs Abs 0.8  0.7 - 4.0 K/uL   Monocytes Relative 5  3 - 12 %   Monocytes Absolute 0.4  0.1 - 1.0 K/uL   Eosinophils Relative 0  0 - 5 %   Eosinophils Absolute 0.0  0.0 - 0.7 K/uL   Basophils Relative 0  0 - 1 %   Basophils Absolute 0.0  0.0 - 0.1 K/uL  BASIC METABOLIC PANEL     Status: Abnormal   Collection Time    09/19/13 11:22 AM      Result Value Ref Range   Sodium 143  137 - 147 mEq/L   Potassium 4.4  3.7 - 5.3 mEq/L   Chloride 100  96 - 112 mEq/L   CO2 28  19 - 32 mEq/L   Glucose, Bld 190 (*) 70 - 99 mg/dL   BUN 19  6 - 23 mg/dL   Creatinine, Ser 1.13  0.50 - 1.35 mg/dL   Calcium 9.5  8.4 - 10.5 mg/dL   GFR calc non Af Wyvonnia Lora  71 (*) >90 mL/min   GFR calc Af Amer 83 (*) >90 mL/min   Comment: (NOTE)     The eGFR has been calculated using the CKD EPI equation.     This calculation has not been validated in all clinical situations.     eGFR's persistently <90 mL/min signify possible Chronic Kidney     Disease.  GLUCOSE, CAPILLARY     Status: Abnormal   Collection Time    09/19/13  5:00 PM      Result Value Ref Range   Glucose-Capillary 142 (*) 70 - 99 mg/dL   Comment 1 Notify RN    GLUCOSE, CAPILLARY     Status: Abnormal   Collection Time    09/19/13  9:09 PM      Result Value Ref Range   Glucose-Capillary 160 (*) 70 - 99 mg/dL  GLUCOSE, CAPILLARY     Status: Abnormal   Collection Time    09/20/13  7:24 AM      Result Value Ref Range   Glucose-Capillary 179 (*) 70 - 99 mg/dL   Comment 1 Notify RN    FERRITIN     Status: Abnormal   Collection Time    09/20/13  9:42 AM      Result Value Ref Range   Ferritin 2143 (*) 22 - 322 ng/mL   Comment: Result confirmed by automatic dilution.     Performed at [facility]     Status: None   Collection Time    09/20/13  9:42 AM      Result Value Ref Range   Folate 18.6     Comment: (NOTE)      Reference Ranges            Deficient:       0.4 - 3.3 ng/mL            Indeterminate:   3.4 - 5.4 ng/mL            Normal:              > 5.4 ng/mL     Performed at [facility]  IRON AND TIBC     Status: Abnormal   Collection Time    09/20/13  9:42 AM      Result Value Ref Range   Iron 35 (*) 42 - 135 ug/dL   TIBC 228  215 - 435 ug/dL   Saturation Ratios 15 (*) 20 - 55 %   UIBC 193  125 - 400 ug/dL   Comment: Performed at [facility]  RETICULOCYTES     Status: Abnormal   Collection Time    09/20/13  9:42 AM      Result Value Ref Range   Retic Ct Pct 3.4 (*) 0.4 - 3.1 %   RBC. 2.64 (*) 4.22 - 5.81 MIL/uL   Retic Count, Manual 89.8  19.0 - 186.0 K/uL  VITAMIN B12     Status: None   Collection Time    09/20/13  9:42 AM      Result Value Ref Range   Vitamin B-12 422  211 - 911 pg/mL   Comment: Performed at [facility], CAPILLARY     Status: Abnormal   Collection Time    09/20/13 11:30 AM      Result Value Ref Range   Glucose-Capillary 147 (*) 70 - 99 mg/dL   Comment 1 Notify RN    GLUCOSE, CAPILLARY  Status: Abnormal   Collection Time    09/20/13  4:29 PM      Result Value Ref Range   Glucose-Capillary 160 (*) 70 - 99 mg/dL   Comment 1 Notify RN    GLUCOSE, CAPILLARY     Status: Abnormal   Collection Time    09/20/13  8:42 PM      Result Value Ref Range   Glucose-Capillary 130 (*) 70 - 99 mg/dL   Comment 1 Notify RN    GLUCOSE, CAPILLARY     Status: Abnormal   Collection Time    09/21/13  7:13 AM      Result Value Ref Range   Glucose-Capillary 148 (*) 70 - 99 mg/dL   Comment 1 Notify RN    GLUCOSE, CAPILLARY     Status: Abnormal   Collection Time    09/21/13 11:46 AM      Result Value Ref Range   Glucose-Capillary 114 (*) 70 - 99 mg/dL   Comment 1 Notify RN    GLUCOSE, CAPILLARY     Status: Abnormal   Collection Time    09/21/13  4:38 PM      Result Value Ref Range   Glucose-Capillary 155 (*) 70 - 99 mg/dL   Comment 1  Notify RN    GLUCOSE, CAPILLARY     Status: Abnormal   Collection Time    09/21/13  9:44 PM      Result Value Ref Range   Glucose-Capillary 158 (*) 70 - 99 mg/dL   Comment 1 Notify RN    GLUCOSE, CAPILLARY     Status: Abnormal   Collection Time    09/22/13  7:06 AM      Result Value Ref Range   Glucose-Capillary 170 (*) 70 - 99 mg/dL   Comment 1 Notify RN        HENT: dentition poor  Head: Normocephalic.  Eyes: EOM are normal.  Neck: Normal range of motion. Neck supple. No thyromegaly present.  Cardiovascular: Normal rate and regular rhythm. No murmurs  Respiratory: Effort normal and breath sounds normal. No respiratory distress.  GI: Soft. Bowel sounds are normal. He exhibits no distension.  Musculoskeletal:   Shoulder , elbow, wrist And hand ROM normal  Neurological: He is alert and oriented to person, place, and time. A sensory deficit is present.  5/5 strength Bilateral delt, bi, tri, grip,  4/5 Bilateral HF and R KE  3- R ankle DF/PF  LLE hip anti gravity R Foot unchanged necrotic 1st, 2nd, 3rd toes no surrounding erythema. Left leg with serosang drainage  Ext no calf swelling or tenderness  Assessment/Plan: 1. Functional deficits secondary to Left BKA for gangrene (diabetic neuropathy, undiagnosed DM, no medical care) which require 3+ hours per day of interdisciplinary therapy in a comprehensive inpatient rehab setting. Physiatrist is providing close team supervision and 24 hour management of active medical problems listed below. Physiatrist and rehab team continue to assess barriers to discharge/monitor patient progress toward functional and medical goals.  FIM: FIM - Bathing Bathing Steps Patient Completed: Chest;Right Arm;Left Arm;Abdomen;Front perineal area;Buttocks;Right upper leg;Left upper leg Bathing: 4: Min-Patient completes 8-9 41f 10 parts or 75+ percent  FIM - Upper Body Dressing/Undressing Upper body dressing/undressing: 0: Wears gown/pajamas-no public  clothing FIM - Lower Body Dressing/Undressing Lower body dressing/undressing steps patient completed: Don/Doff right shoe;Fasten/unfasten right shoe;Pull pants up/down Lower body dressing/undressing: 4: Min-Patient completed 75 plus % of tasks  FIM - Toileting Toileting steps completed by patient: Adjust clothing  prior to toileting;Performs perineal hygiene;Adjust clothing after toileting Toileting: 5: Supervision: Safety issues/verbal cues  FIM - Air cabin crew Transfers Assistive Devices: Elevated toilet seat;Grab bars;Walker Toilet Transfers: 5-To toilet/BSC: Supervision (verbal cues/safety issues);5-From toilet/BSC: Supervision (verbal cues/safety issues)  FIM - Engineer, site Assistive Devices: Arm rests Bed/Chair Transfer: 6: Supine > Sit: No assist;6: Sit > Supine: No assist;5: Bed > Chair or W/C: Supervision (verbal cues/safety issues);5: Chair or W/C > Bed: Supervision (verbal cues/safety issues)  FIM - Locomotion: Wheelchair Distance: 150 Locomotion: Wheelchair: 6: Travels 150 ft or more, turns around, maneuvers to table, bed or toilet, negotiates 3% grade: maneuvers on rugs and over door sills independently FIM - Locomotion: Ambulation Locomotion: Ambulation Assistive Devices: Administrator Ambulation/Gait Assistance: 4: Min assist Locomotion: Ambulation: 0: Activity did not occur  Comprehension Comprehension Mode: Auditory Comprehension: 6-Follows complex conversation/direction: With extra time/assistive device  Expression Expression Mode: Verbal Expression: 6-Expresses complex ideas: With extra time/assistive device  Social Interaction Social Interaction: 6-Interacts appropriately with others with medication or extra time (anti-anxiety, antidepressant).  Problem Solving Problem Solving: 6-Solves complex problems: With extra time  Memory Memory: 6-More than reasonable amt of time  Medical Problem List and Plan:  Left BKA, and R  necrotic toes  1. DVT Prophylaxis/Anticoagulation:   SQ lovenox.  2. Pain Management: Prn oxcycodone.  3. Mood: Does not express any signs of distress or concerns. LCSW to follow for evaluation.  4. Neuropsych: This patient is capable of making decisions on his own behalf.  5. DM type 2 poorly controlled: Monitor BS with ac/hs checks.   lantus changed to 70/30 insulin 15u bid with good results.  Use SSI for elevated BS and titrate as indicated.    6. Recurrent fever: resolved off abx, low grade yesterday  7. Anemia , guaic - x 1 neg, has had no medical care until this hospitalization, GI follow up 8. Vomiting, resolved may be related to tramadol    LOS (Days) 9 A FACE TO FACE EVALUATION WAS PERFORMED  SWARTZ,ZACHARY T 09/22/2013, 9:10 AM

## 2013-09-23 ENCOUNTER — Inpatient Hospital Stay (HOSPITAL_COMMUNITY): Payer: Medicaid Other | Admitting: Physical Therapy

## 2013-09-23 ENCOUNTER — Inpatient Hospital Stay (HOSPITAL_COMMUNITY): Payer: Medicaid Other | Admitting: Occupational Therapy

## 2013-09-23 DIAGNOSIS — S88119A Complete traumatic amputation at level between knee and ankle, unspecified lower leg, initial encounter: Secondary | ICD-10-CM

## 2013-09-23 DIAGNOSIS — E1310 Other specified diabetes mellitus with ketoacidosis without coma: Secondary | ICD-10-CM

## 2013-09-23 DIAGNOSIS — I739 Peripheral vascular disease, unspecified: Secondary | ICD-10-CM

## 2013-09-23 DIAGNOSIS — L02619 Cutaneous abscess of unspecified foot: Secondary | ICD-10-CM

## 2013-09-23 DIAGNOSIS — L98499 Non-pressure chronic ulcer of skin of other sites with unspecified severity: Secondary | ICD-10-CM

## 2013-09-23 DIAGNOSIS — L03119 Cellulitis of unspecified part of limb: Secondary | ICD-10-CM

## 2013-09-23 DIAGNOSIS — M726 Necrotizing fasciitis: Secondary | ICD-10-CM

## 2013-09-23 LAB — BASIC METABOLIC PANEL
BUN: 18 mg/dL (ref 6–23)
CO2: 25 mEq/L (ref 19–32)
CREATININE: 1.16 mg/dL (ref 0.50–1.35)
Calcium: 8.8 mg/dL (ref 8.4–10.5)
Chloride: 103 mEq/L (ref 96–112)
GFR calc Af Amer: 80 mL/min — ABNORMAL LOW (ref >90)
GFR calc non Af Amer: 69 mL/min — ABNORMAL LOW (ref >90)
Glucose, Bld: 146 mg/dL — ABNORMAL HIGH (ref 70–99)
Potassium: 3.9 mEq/L (ref 3.7–5.3)
Sodium: 142 mEq/L (ref 137–147)

## 2013-09-23 LAB — GLUCOSE, CAPILLARY
GLUCOSE-CAPILLARY: 114 mg/dL — AB (ref 70–99)
GLUCOSE-CAPILLARY: 112 mg/dL — AB (ref 70–99)
Glucose-Capillary: 144 mg/dL — ABNORMAL HIGH (ref 70–99)
Glucose-Capillary: 140 mg/dL — ABNORMAL HIGH (ref 70–99)

## 2013-09-23 LAB — CBC
HCT: 23.1 % — ABNORMAL LOW (ref 39.0–52.0)
Hemoglobin: 7.4 g/dL — ABNORMAL LOW (ref 13.0–17.0)
MCH: 28.7 pg (ref 26.0–34.0)
MCHC: 32 g/dL (ref 30.0–36.0)
MCV: 89.5 fL (ref 78.0–100.0)
PLATELETS: 384 10*3/uL (ref 150–400)
RBC: 2.58 MIL/uL — ABNORMAL LOW (ref 4.22–5.81)
RDW: 14.4 % (ref 11.5–15.5)
WBC: 8.1 10*3/uL (ref 4.0–10.5)

## 2013-09-23 LAB — OCCULT BLOOD X 1 CARD TO LAB, STOOL: Fecal Occult Bld: NEGATIVE

## 2013-09-23 NOTE — Progress Notes (Signed)
Occupational Therapy Session Note  Patient Details  Name: [name] MRN: [id number] Date of Birth: [birth year]  Today's Date: 09/23/2013 Time: [id number] and 1300-1330 Time Calculation (min): 53 min and 30 min  Short Term Goals: Week 2:    STG = LTGs  Skilled Therapeutic Interventions/Progress Updates:    1) Engaged in ADL retraining with focus on functional mobility with RW, sit > stand, and standing balance with bathing and dressing. Pt donned Darco shoe seated EOB with use of step stool for leverage and ambulated to sink to complete bathing.  Pt demonstrating increased posterior lean while standing to pull up pants and continues to rely on counter or chair to stabilize self in standing due to decreased balance and unlevel Darco shoe on Rt foot.  Engaged in grooming tasks at sink in standing with focus on standing balance and tolerance.  Pt requesting to engage in w/c mobility on unit between sessions and afternoons as he feels he is losing his independence by being confined to his room all the time, spoke with RN who is okay with this as long as he checks in at RN station and stays on the unit.  2) Engaged in therapeutic activity with focus on standing balance and activity tolerance.  Pt propelled w/c to therapy gym with mod I and engaged in standing task at high low table with focus on standing tolerance and balance while reaching outside BOS.  Pt with no LOB this session.  Therapy Documentation Precautions:  Precautions Precautions: Fall Precaution Comments:   Required Braces or Orthoses: Other Brace/Splint Other Brace/Splint: R DARCO shoe when OOB Restrictions Weight Bearing Restrictions: Yes LLE Weight Bearing: Non weight bearing Other Position/Activity Restrictions: keep L knee straight/elevated when in w/c, bed Pain:  Pt with no c/o pain  See FIM for current functional status  Therapy/Group: Individual Therapy  [name] 09/23/2013, 11:46 AM

## 2013-09-23 NOTE — Progress Notes (Signed)
Physical Therapy Session Note  Patient Details  Name: [name] MRN: [id number] Date of Birth: March 21, 1956  Today's Date: 09/23/2013 Time: 1006-1103 and [id number] Time Calculation (min): 57 min and 47 min  Short Term Goals: No short term goals set  Skilled Therapeutic Interventions/Progress Updates:   Pt resting in w/c.  Still reporting fatigue but denies dizziness/lightheadedness or pain.  Performed w/c mobility mod I in controlled environment.  Pt performed set up of w/c for transfer to Nustep with min verbal cues for safe removal of leg rest prior to positioning w/c.  Performed transfers w/c <> Nustep squat pivot with supervision with min verbal cues for safe hand placement for transfer.  Performed bilat UE and RLE strengthening and endurance training on Nustep x 10 minutes (with prolonged rest break half way) at level 6 resistance with vitals assessed half way and at end of 10 minutes; pt reporting 13 on RPE.  Transitioned back to w/c and to hallway to perform gait training with RW forwards x 30', retro x 15' and laterally x 15' with intermittent rest breaks and min guard for balance with verbal and visual cues for safe retro stepping sequence to maintain COG over R stance LE.  Pt feels more confident with gait now but still fatigues quickly.  Returned to room and pt set up w/c for transfer and performed w/c > EOB transfer supervision but reports that foot gets caught on front caster as he pivots.  Recommending that pt visually determine placement of R foot prior to beginning transfer to ensure safe position secondary to impaired sensation/proprioception.  Pt verbalized understanding.    Second session: pt asleep in the bed but easily awakened.  Pt transferred bed <> w/c <> mat squat pivot supervision but now able to set up w/c mod I, without verbal cues.  On mat pt transitioned to prone and prone on elbows for prolonged hip flexor stretch; performed 2 sets x 12 reps R and LLE hamstring curls  and straight leg hip extensions with intermittent rest breaks for increased hip flexor stretch and posterior leg strengthening.  Pt reporting that during exercises his phantom limb sensations cease.  Transitioned to quadruped on mat and performed 2 sets x 3 reps LLE extensions and 5 reps alternating UE flexion for core and trunk control training/strengthening.  Returned to room and to bed supervision-mod I.    Therapy Documentation Precautions:  Precautions Precautions: Fall Precaution Comments:   Required Braces or Orthoses: Other Brace/Splint Other Brace/Splint: R DARCO shoe when OOB Restrictions Weight Bearing Restrictions: Yes LLE Weight Bearing: Non weight bearing Other Position/Activity Restrictions: keep L knee straight/elevated when in w/c, bed Vital Signs: Therapy Vitals Pulse Rate: 104 Oxygen Therapy SpO2: 93 % Pulse Oximetry Type: Intermittent Pain: Pain Assessment Pain Assessment: No/denies pain Pain Score: 0-No pain Locomotion : Ambulation Ambulation/Gait Assistance: 4: Min guard Wheelchair Mobility Distance: 150   See FIM for current functional status  Therapy/Group: Individual Therapy  Raylene Everts [facility] 09/23/2013, 12:10 PM

## 2013-09-23 NOTE — Progress Notes (Signed)
Subjective/Complaints: 58 y.o. male with history of diabetes mellitus, HTN, h/o hepatitis, BLE numbness since frostbite last year but no medical care. He has been living in his car for past 4-5 years (had declined opportunity to live with family) who was brought to ED on 09/01/13 by family due to inability to care for self, confusion as well as left foot infection. X rays with extensive soft tissue gas consistent with cellulitis and abscess formation. He was found to have diabetic ketoacidosis and treated with insulin infusion. He was evaluated by Dr. Erlinda Hong and placed on IV antibiotics. His foot culture positive for proteus mirabilis--pan sensitive. MRI with for concern for necrotizing fascitis and patient taken to OR for I & D with 4 th and 5th toe amputation with placement of VAC. Also noted to have necrotic 1st, 2nd and 5 th toes on the right and likey allow auto amputation per ortho. 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 No new complaints   Review of Systems - no pains, no nausea or vomiting  Objective: Vital Signs: Blood pressure 133/74, pulse 89, temperature 97.9 F (36.6 C), temperature source Oral, resp. rate 18, height $RemoveBe'5\' 3"'mjKygmsaJ$  (1.6 m), weight 65.1 kg (143 lb 8.3 oz), SpO2 97.00%. No results found. Results for orders placed during the hospital encounter of 09/13/13 (from the past 72 hour(s))  FERRITIN     Status: Abnormal   Collection Time    09/20/13  9:42 AM      Result Value Ref Range   Ferritin 2143 (*) 22 - 322 ng/mL   Comment: Result confirmed by automatic dilution.     Performed at [facility]     Status: None   Collection Time    09/20/13  9:42 AM      Result Value Ref Range   Folate 18.6     Comment: (NOTE)     Reference Ranges            Deficient:       0.4 - 3.3 ng/mL            Indeterminate:   3.4 - 5.4 ng/mL            Normal:              > 5.4 ng/mL     Performed at [facility]     Status: Abnormal   Collection Time    09/20/13  9:42 AM      Result Value Ref Range   Iron 35 (*) 42 - 135 ug/dL   TIBC 228  215 - 435 ug/dL   Saturation Ratios 15 (*) 20 - 55 %   UIBC 193  125 - 400 ug/dL   Comment: Performed at [place] [facility]     Status: Abnormal   Collection Time    09/20/13  9:42 AM      Result Value Ref Range   Retic Ct Pct 3.4 (*) 0.4 - 3.1 %   RBC. 2.64 (*) 4.22 - 5.81 MIL/uL   Retic Count, Manual 89.8  19.0 - 186.0 K/uL  VITAMIN B12     Status: None   Collection Time    09/20/13  9:42 AM      Result Value Ref Range   Vitamin B-12 422  211 - 911 pg/mL   Comment: Performed at [place]  Status: Abnormal   Collection Time    09/20/13 11:30 AM      Result Value Ref Range   Glucose-Capillary 147 (*) 70 - 99 mg/dL   Comment 1 Notify RN    GLUCOSE, CAPILLARY     Status: Abnormal   Collection Time    09/20/13  4:29 PM      Result Value Ref Range   Glucose-Capillary 160 (*) 70 - 99 mg/dL   Comment 1 Notify RN    GLUCOSE, CAPILLARY     Status: Abnormal   Collection Time    09/20/13  8:42 PM      Result Value Ref Range   Glucose-Capillary 130 (*) 70 - 99 mg/dL   Comment 1 Notify RN    GLUCOSE, CAPILLARY     Status: Abnormal   Collection Time    09/21/13  7:13 AM      Result Value Ref Range   Glucose-Capillary 148 (*) 70 - 99 mg/dL   Comment 1 Notify RN    GLUCOSE, CAPILLARY     Status: Abnormal   Collection Time    09/21/13 11:46 AM      Result Value Ref Range   Glucose-Capillary 114 (*) 70 - 99 mg/dL   Comment 1 Notify RN    GLUCOSE, CAPILLARY     Status: Abnormal   Collection Time    09/21/13  4:38 PM      Result Value Ref Range   Glucose-Capillary 155 (*) 70 - 99 mg/dL   Comment 1 Notify RN    GLUCOSE, CAPILLARY     Status: Abnormal   Collection Time    09/21/13  9:44 PM      Result Value Ref Range   Glucose-Capillary 158 (*) 70 - 99 mg/dL   Comment 1  Notify RN    GLUCOSE, CAPILLARY     Status: Abnormal   Collection Time    09/22/13  7:06 AM      Result Value Ref Range   Glucose-Capillary 170 (*) 70 - 99 mg/dL   Comment 1 Notify RN    GLUCOSE, CAPILLARY     Status: Abnormal   Collection Time    09/22/13 11:34 AM      Result Value Ref Range   Glucose-Capillary 168 (*) 70 - 99 mg/dL   Comment 1 Notify RN    GLUCOSE, CAPILLARY     Status: Abnormal   Collection Time    09/22/13  4:50 PM      Result Value Ref Range   Glucose-Capillary 198 (*) 70 - 99 mg/dL   Comment 1 Notify RN    GLUCOSE, CAPILLARY     Status: Abnormal   Collection Time    09/22/13  8:42 PM      Result Value Ref Range   Glucose-Capillary 133 (*) 70 - 99 mg/dL  CBC     Status: Abnormal   Collection Time    09/23/13  4:25 AM      Result Value Ref Range   WBC 8.1  4.0 - 10.5 K/uL   RBC 2.58 (*) 4.22 - 5.81 MIL/uL   Hemoglobin 7.4 (*) 13.0 - 17.0 g/dL   HCT 23.1 (*) 39.0 - 52.0 %   MCV 89.5  78.0 - 100.0 fL   MCH 28.7  26.0 - 34.0 pg   MCHC 32.0  30.0 - 36.0 g/dL   RDW 14.4  11.5 - 15.5 %   Platelets 384  150 - 400 K/uL  BASIC  METABOLIC PANEL     Status: Abnormal   Collection Time    09/23/13  4:25 AM      Result Value Ref Range   Sodium 142  137 - 147 mEq/L   Potassium 3.9  3.7 - 5.3 mEq/L   Chloride 103  96 - 112 mEq/L   CO2 25  19 - 32 mEq/L   Glucose, Bld 146 (*) 70 - 99 mg/dL   BUN 18  6 - 23 mg/dL   Creatinine, Ser 1.16  0.50 - 1.35 mg/dL   Calcium 8.8  8.4 - 10.5 mg/dL   GFR calc non Af Amer 69 (*) >90 mL/min   GFR calc Af Amer 80 (*) >90 mL/min   Comment: (NOTE)     The eGFR has been calculated using the CKD EPI equation.     This calculation has not been validated in all clinical situations.     eGFR's persistently <90 mL/min signify possible Chronic Kidney     Disease.  GLUCOSE, CAPILLARY     Status: Abnormal   Collection Time    09/23/13  7:23 AM      Result Value Ref Range   Glucose-Capillary 144 (*) 70 - 99 mg/dL   Comment 1 Notify  RN        HENT: dentition poor  Head: Normocephalic.  Eyes: EOM are normal.  Neck: Normal range of motion. Neck supple. No thyromegaly present.  Cardiovascular: Normal rate and regular rhythm. No murmurs  Respiratory: Effort normal and breath sounds normal. No respiratory distress.  GI: Soft. Bowel sounds are normal. He exhibits no distension.  Musculoskeletal:   Shoulder , elbow, wrist And hand ROM normal  Neurological: He is alert and oriented to person, place, and time. A sensory deficit is present.  5/5 strength Bilateral delt, bi, tri, grip,  4/5 Bilateral HF and R KE  3- R ankle DF/PF  LLE hip anti gravity R Foot unchanged necrotic 1st, 2nd, 5th toes no surrounding erythema. Left leg without drainage  Ext no calf swelling or tenderness  Assessment/Plan: 1. Functional deficits secondary to Left BKA for gangrene (diabetic neuropathy, undiagnosed DM, no medical care) which require 3+ hours per day of interdisciplinary therapy in a comprehensive inpatient rehab setting. Physiatrist is providing close team supervision and 24 hour management of active medical problems listed below. Physiatrist and rehab team continue to assess barriers to discharge/monitor patient progress toward functional and medical goals.  FIM: FIM - Bathing Bathing Steps Patient Completed: Chest;Right Arm;Left Arm;Abdomen;Front perineal area;Buttocks;Right upper leg;Left upper leg Bathing: 4: Min-Patient completes 8-9 84f 10 parts or 75+ percent  FIM - Upper Body Dressing/Undressing Upper body dressing/undressing: 0: Wears gown/pajamas-no public clothing FIM - Lower Body Dressing/Undressing Lower body dressing/undressing steps patient completed: Don/Doff right shoe;Fasten/unfasten right shoe;Pull pants up/down Lower body dressing/undressing: 4: Min-Patient completed 75 plus % of tasks  FIM - Toileting Toileting steps completed by patient: Adjust clothing prior to toileting;Performs perineal hygiene;Adjust  clothing after toileting Toileting Assistive Devices: Grab bar or rail for support Toileting: 5: Supervision: Safety issues/verbal cues  FIM - Radio producer Devices: Elevated toilet seat;Grab bars;Walker Toilet Transfers: 5-From toilet/BSC: Supervision (verbal cues/safety issues);5-To toilet/BSC: Supervision (verbal cues/safety issues)  FIM - Control and instrumentation engineer Devices: Arm rests Bed/Chair Transfer: 5: Sit > Supine: Supervision (verbal cues/safety issues);5: Chair or W/C > Bed: Supervision (verbal cues/safety issues);5: Bed > Chair or W/C: Supervision (verbal cues/safety issues)  FIM - Locomotion: Wheelchair Distance: 150 Locomotion: Wheelchair:  6: Travels 150 ft or more, turns around, maneuvers to table, bed or toilet, negotiates 3% grade: maneuvers on rugs and over door sills independently FIM - Locomotion: Ambulation Locomotion: Ambulation Assistive Devices: Administrator Ambulation/Gait Assistance: 4: Min assist Locomotion: Ambulation: 0: Activity did not occur  Comprehension Comprehension Mode: Auditory Comprehension: 6-Follows complex conversation/direction: With extra time/assistive device  Expression Expression Mode: Verbal Expression: 6-Expresses complex ideas: With extra time/assistive device  Social Interaction Social Interaction: 6-Interacts appropriately with others with medication or extra time (anti-anxiety, antidepressant).  Problem Solving Problem Solving: 6-Solves complex problems: With extra time  Memory Memory: 6-More than reasonable amt of time  Medical Problem List and Plan:  Left BKA, and R necrotic toes  1. DVT Prophylaxis/Anticoagulation:   SQ lovenox.  2. Pain Management: Prn oxcycodone.  3. Mood: Does not express any signs of distress or concerns. LCSW to follow for evaluation.  4. Neuropsych: This patient is capable of making decisions on his own behalf.  5. DM type 2 poorly  controlled: Monitor BS with ac/hs checks.   lantus changed to 70/30 insulin 15u bid with good results.  Use SSI for elevated BS and titrate as indicated.    6. Recurrent fever: resolved off abx, low grade yesterday  7. Anemia , guaic - x 1 neg, has had no medical care until this hospitalization, GI doubts bleed suspectinjg chronic disease , Hgb cont to drop may need transfusion if symptomatic , check orthostatics 8. Vomiting, resolved may be related to tramadol    LOS (Days) [place] EVALUATION WAS PERFORMED  KIRSTEINS,ANDREW E 09/23/2013, 7:55 AM

## 2013-09-24 ENCOUNTER — Encounter (HOSPITAL_COMMUNITY): Payer: Self-pay

## 2013-09-24 ENCOUNTER — Inpatient Hospital Stay (HOSPITAL_COMMUNITY): Payer: Medicaid Other | Admitting: Physical Therapy

## 2013-09-24 ENCOUNTER — Inpatient Hospital Stay (HOSPITAL_COMMUNITY): Payer: Self-pay | Admitting: Occupational Therapy

## 2013-09-24 LAB — GLUCOSE, CAPILLARY
GLUCOSE-CAPILLARY: 129 mg/dL — AB (ref 70–99)
GLUCOSE-CAPILLARY: 101 mg/dL — AB (ref 70–99)
Glucose-Capillary: 148 mg/dL — ABNORMAL HIGH (ref 70–99)
Glucose-Capillary: 81 mg/dL (ref 70–99)

## 2013-09-24 LAB — PREPARE RBC (CROSSMATCH)

## 2013-09-24 LAB — ABO/RH: ABO/RH(D): O NEG

## 2013-09-24 MED ORDER — ACETAMINOPHEN 325 MG PO TABS
650.0000 mg | ORAL_TABLET | Freq: Once | ORAL | Status: AC
  Administered 2013-09-24: 650 mg via ORAL

## 2013-09-24 MED ORDER — DIPHENHYDRAMINE HCL 25 MG PO CAPS
25.0000 mg | ORAL_CAPSULE | Freq: Once | ORAL | Status: AC
  Administered 2013-09-24: 25 mg via ORAL
  Filled 2013-09-24: qty 1

## 2013-09-24 MED ORDER — ACETAMINOPHEN 325 MG PO TABS
650.0000 mg | ORAL_TABLET | Freq: Once | ORAL | Status: DC
  Filled 2013-09-24: qty 2

## 2013-09-24 NOTE — Progress Notes (Signed)
[facility] PROGRESS NOTE Subjective Pts stools remain negative. He denies any melena or BRB no history of ulcers.   Objective: Vital signs in last 24 hours: Temp:  [98.3 F (36.8 C)-99.3 F (37.4 C)] 98.9 F (37.2 C) (02/24 0817) Pulse Rate:  [92-101] 99 (02/24 0513) Resp:  [18] 18 (02/24 0513) BP: (118-130)/(72-80) 129/72 mmHg (02/24 0513) SpO2:  [93 %-95 %] 93 % (02/24 0513) Last BM Date: 09/24/13  Intake/Output from previous day: 02/23 0701 - 02/24 0700 In: 1040 [P.O.:1040] Out: 1350 [Urine:1350] Intake/Output this shift: Total I/O In: 480 [P.O.:480] Out: -     Lab Results:  Recent Labs  09/23/13 0425  WBC 8.1  HGB 7.4*  HCT 23.1*  PLT 384   BMET  Recent Labs  09/23/13 0425  NA 142  K 3.9  CL 103  CO2 25  CREATININE 1.16   LFT No results found for this basename: PROT, AST, ALT, ALKPHOS, BILITOT, BILIDIR, IBILI,  in the last 72 hours PT/INR No results found for this basename: LABPROT, INR,  in the last 72 hours PANCREAS No results found for this basename: LIPASE,  in the last 72 hours  Iron 35 sat 15% folate and B12 are normal     Studies/Results: No results found.  Medications: I have reviewed the patient's current medications.  Assessment/Plan: 1. Anemia. Multiple stools negative. Iron somewhat low. Agree with transfusion and would continue on oral iron. After the pt has successfully completed rehab, would have him f/u with Dr Penelope Coop in the office. May need elective endoscopic evaluation after other problems addressed.   Lucie Friedlander JR,Larayne Baxley L 09/24/2013, 12:47 PM

## 2013-09-24 NOTE — Progress Notes (Signed)
Occupational Therapy Session Note  Patient Details  Name: [name] MRN: [id number] Date of Birth: Aug 21, 1955  Today's Date: 09/24/2013 Time: [id number] Time Calculation (min): 50 min  Skilled Therapeutic Interventions/Progress Updates:    Pt worked on standing balance during session.  Attempted to utilize the Wii for balance while supporting himself with the RW however pt voicing that he does not like any video games.  Instead had pt work on standing at the high/low table while maintaining balance and alternating placing clothespins on a vertical pole.  Pt able to stand for 3-4 mins while alternating hands with close supervision.  While resting pt voiced session how he is supposed to get blood today and that the MD told him he wasn't producing red blood cells.  Pt also voiced concerns about his vision being blurry but that he cannot see an optometrist in the hospital.  Finished session by having pt ambulate short distance while attempting to hop over a walking stick placed in the floor and a five inch wide board.  Pt able to step over the walking stick with min assist but was unable to support his bodyweight enough to step over the board.  Instead he stepped on it and needed mod assist to maintain his balance.    Therapy Documentation Precautions:  Precautions Precautions: Fall Precaution Comments:   Required Braces or Orthoses: Other Brace/Splint Other Brace/Splint: R DARCO shoe when OOB Restrictions Weight Bearing Restrictions: No LLE Weight Bearing: Non weight bearing Other Position/Activity Restrictions: keep L knee straight/elevated when in w/c, bed  Pain: No report of pain during session.  ADL: See FIM for current functional status  Therapy/Group: Individual Therapy  Jelicia Nantz OTR/L 09/24/2013, 3:50 PM

## 2013-09-24 NOTE — Progress Notes (Signed)
NUTRITION FOLLOW UP  Intervention:   1.   Supplements; continue Glucerna shake daily. Continue MVI. 2.  General healthful diet; encourage intake of foods and beverages as able.  RD to follow and assess for nutritional adequacy.   Nutrition Dx:   Increased nutrient needs related to infected foot wound as evidenced by estimated nutrition needs.   Goal:  Intake to meet >90% of estimated nutrition needs to support wound healing; optimal glycemic control to support healing.   Monitor:  PO intake, labs, weight trend, education needs  Assessment:   Patient with history of hepatitis; presented to the hospital on 2/1 with foot swelling since last week. Patient became incontinent of urine, feeling lightheaded, blurred vision, patient states that he started to loose balance as well. X-ray on admission showed gas in the soft tissue. Underwent I&D with 4th and 5th amputation to L foot as well as placement of VAC. 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 Noted to refuse DM education by nursing staff during acute hospitalization.   Educated by RD staff on 2/4 during acute hospitalization - pt was given handouts and was noted to be feeling very overwhelmed with information. Confirmed with patient that he was eating well during acute hospitalization.  Pt continues to eat well at 100% of his meals.   Glucerna has been discontinued, however pt eating well.  Will d/c beneprotein.   Height: Ht Readings from Last 1 Encounters:  09/13/13 5\' 3"  (1.6 m)    Weight Status:   Wt Readings from Last 1 Encounters:  09/18/13 143 lb 8.3 oz (65.1 kg)    Re-estimated needs:  Kcal: 2000  Protein: 95 - 110 gm  Fluid: 2 liters daily  Skin: incision to stump site  Diet Order: Carb Control   Intake/Output Summary (Last 24 hours) at 09/24/13 1453 Last data filed at 09/24/13 1300  Gross per 24 hour  Intake    820 ml  Output    850 ml  Net    -30 ml    Last BM:  2/24  Labs:   Recent Labs Lab 09/18/13 1113 09/19/13 1122 09/23/13 0425  NA 135* 143 142  K 3.9 4.4 3.9  CL 97 100 103  CO2 22 28 25   BUN 13 19 18   CREATININE 1.03 1.13 1.16  CALCIUM 9.3 9.5 8.8  GLUCOSE 149* 190* 146*    CBG (last 3)   Recent Labs  09/23/13 2039 09/24/13 0749 09/24/13 1159  GLUCAP 112* 129* 101*    Scheduled Meds: . acetaminophen  650 mg Oral Once  . acetaminophen  650 mg Oral Once  . aspirin EC  325 mg Oral Daily  . diphenhydrAMINE  25 mg Oral Once  . enoxaparin (LOVENOX) injection  40 mg Subcutaneous Q24H  . ferrous sulfate  325 mg Oral BID WC  . insulin aspart  0-20 Units Subcutaneous TID WC  . insulin aspart  0-5 Units Subcutaneous QHS  . insulin aspart protamine- aspart  15 Units Subcutaneous BID WC  . multivitamin with minerals  1 tablet Oral Daily  . protein supplement  1 scoop Oral TID WC    Continuous Infusions:   Brynda Greathouse, MS RD LDN Clinical Inpatient Dietitian Pager: [phone] Weekend/After hours pager: [phone]

## 2013-09-24 NOTE — Progress Notes (Signed)
Occupational Therapy Session Note  Patient Details  Name: [name] MRN: [id number] Date of Birth: [birth year]  Today's Date: 09/24/2013 Time: 1100-1200 Time Calculation (min): 60 min  Short Term Goals: Week 1:  OT Short Term Goal 1 (Week 1): Short Term Goals = Long Term Goals  Skilled Therapeutic Interventions/Progress Updates:    Pt resting in bed upon arrival and agreeable to participating in therapy.  Pt stated that all he needed to do was bathe UB because LB had been earlier cleaned after incontinent episode.  Pt transferred to w/c with close supervision and completed UB bathing and grooming seated in w/c at sink.  Pt rolled to laundry room and stood at washer to place clothing in washer before transitioning to gym for BUE therex with therabands.  Pt completed 2 sets of 10 reps of 5 UE exercises.  Pt rolled back to room and transferred to EOB to await lunch.  Therapy Documentation Precautions:  Precautions Precautions: Fall Precaution Comments:   Required Braces or Orthoses: Other Brace/Splint Other Brace/Splint: R DARCO shoe when OOB Restrictions Weight Bearing Restrictions: Yes LLE Weight Bearing: Non weight bearing Other Position/Activity Restrictions: keep L knee straight/elevated when in w/c, bed Pain: Pain Assessment Pain Assessment: No/denies pain  See FIM for current functional status  Therapy/Group: Individual Therapy  Leroy Libman 09/24/2013, 12:13 PM

## 2013-09-24 NOTE — Progress Notes (Signed)
Physical Therapy Session Note  Patient Details  Name: [name] MRN: [id number] Date of Birth: 1956-02-19  Today's Date: 09/24/2013 Time: 0906-1001 and [id number]    Time Calculation (min): 55 min and 50 min  Short Term Goals: No short term goals set  Skilled Therapeutic Interventions/Progress Updates:   Pt reporting that he will be receiving 2 units red blood cells this pm after therapy.  Pt still reporting fatigue but willing to participate.  During transfer supine > sit and bed > w/c pt noted to have incontinent stool smear on bed and in pants.  Pt transferred w/c <> toilet stand pivot with grab bars and supervision.  Pt performed all toileting with supervision and returned to w/c supervision.  Pt performed hygiene in standing at sink with supervision and changed gowns in w/c.  Pt pants placed in bag; pt to take pants to wash with OT during later session.  Attempted to have pt replace R foot rest on w/c; pt became extremely frustrated and stated that since being diagnosed with diabetes his vision has worsened and he couldn't understand "why they haven't had a doctor come look at my eyes!"  Assisted pt with donning leg rest and explained to pt that eye exams are usually performed on an outpatient basis and that if it is the diabetes causing the decline then the education he will receive in the hospital about diabetes management should assist with slowing his visual decline.  Will discuss pt concerns with team at conference.  Performed mod I w/c mobility to gym and performed standing LLE exercises for balance and strengthening: 12 reps each HS curls, hip extension, hip ABD open chain with bilat UE support on // bars.  Returned to room and transferred to EOB with supervision.  Pt easily frustrated today with multiple c/o about food, care, reason for having transfusion, etc.  Attempted to provide pt with support and encouragement; will also refer pt for neuropsych consult for coping for multiple medical  issues.      PM session: Pt still reporting frustration and anxiety about transfusion and other medical issues but willing to participate.  Offered pt encouragement and emotional support.  Performed gait training in controlled environment with RW x 25' in straight path with min A + 25' weaving L and R around cones for changes in direction and obstacle negotiation with min A; pt required two sitting rest breaks for deep breathing secondary to low Sp02 during gait and to lower HR.  Returned to mat with squat pivot supervision and performed dynamic sitting balance and core strengthening with foot off floor during dynamic UE exercises: 10 reps each with 5lb bar: chest press, chest press with clockwise and counterclockwise rotation, and overhead press.  Returned to w/c and w/c > bed squat pivot supervision.    Therapy Documentation Precautions:  Precautions Precautions: Fall Precaution Comments:   Required Braces or Orthoses: Other Brace/Splint Other Brace/Splint: R DARCO shoe when OOB Restrictions Weight Bearing Restrictions: Yes LLE Weight Bearing: Non weight bearing Other Position/Activity Restrictions: keep L knee straight/elevated when in w/c, bed Vital Signs: Therapy Vitals Temp: 98.9 F (37.2 C) Temp src: Oral Pain: Pain Assessment Pain Assessment: No/denies pain Locomotion : Wheelchair Mobility Distance: 150   See FIM for current functional status  Therapy/Group: Individual Therapy  Raylene Everts [facility] 09/24/2013, 11:43 AM

## 2013-09-24 NOTE — Progress Notes (Signed)
Subjective/Complaints: 58 y.o. male with history of diabetes mellitus, HTN, h/o hepatitis, BLE numbness since frostbite last year but no medical care. He has been living in his car for past 4-5 years (had declined opportunity to live with family) who was brought to ED on 09/01/13 by family due to inability to care for self, confusion as well as left foot infection. X rays with extensive soft tissue gas consistent with cellulitis and abscess formation. He was found to have diabetic ketoacidosis and treated with insulin infusion. He was evaluated by Dr. Erlinda Hong and placed on IV antibiotics. His foot culture positive for proteus mirabilis--pan sensitive. MRI with for concern for necrotizing fascitis and patient taken to OR for I & D with 4 th and 5th toe amputation with placement of VAC. Also noted to have necrotic 1st, 2nd and 5 th toes on the right and likey allow auto amputation per ortho. 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 rate 18, height $RemoveBe'5\' 3"'ltjbRjNpE$  (1.6 m), weight 65.1 kg (143 lb 8.3 oz), SpO2 93.00%. No results found. Results for orders placed during the hospital encounter of 09/13/13 (from the past 72 hour(s))  GLUCOSE, CAPILLARY     Status: Abnormal   Collection Time    09/21/13  7:13 AM      Result Value Ref Range   Glucose-Capillary 148 (*) 70 - 99 mg/dL   Comment 1 Notify RN    GLUCOSE, CAPILLARY     Status: Abnormal   Collection Time    09/21/13 11:46 AM      Result Value Ref Range   Glucose-Capillary 114 (*) 70 - 99 mg/dL   Comment 1 Notify RN    GLUCOSE, CAPILLARY     Status: Abnormal   Collection Time    09/21/13  4:38 PM      Result Value Ref Range   Glucose-Capillary 155 (*) 70 - 99 mg/dL   Comment 1 Notify RN    GLUCOSE, CAPILLARY     Status: Abnormal   Collection Time    09/21/13  9:44 PM      Result Value Ref Range   Glucose-Capillary 158 (*) 70 - 99 mg/dL   Comment 1 Notify RN    GLUCOSE, CAPILLARY     Status: Abnormal   Collection Time    09/22/13  7:06 AM      Result Value Ref Range   Glucose-Capillary 170 (*) 70 - 99 mg/dL   Comment 1 Notify RN    GLUCOSE, CAPILLARY     Status: Abnormal   Collection Time    09/22/13 11:34 AM      Result Value Ref Range   Glucose-Capillary 168 (*) 70 - 99 mg/dL   Comment 1 Notify RN    GLUCOSE, CAPILLARY     Status: Abnormal   Collection Time    09/22/13  4:50 PM      Result Value Ref Range   Glucose-Capillary 198 (*) 70 - 99 mg/dL   Comment 1 Notify RN    GLUCOSE, CAPILLARY     Status: Abnormal   Collection Time    09/22/13  8:42 PM      Result Value Ref Range  Glucose-Capillary 133 (*) 70 - 99 mg/dL  CBC     Status: Abnormal   Collection Time    09/23/13  4:25 AM      Result Value Ref Range   WBC 8.1  4.0 - 10.5 K/uL   RBC 2.58 (*) 4.22 - 5.81 MIL/uL   Hemoglobin 7.4 (*) 13.0 - 17.0 g/dL   HCT 23.1 (*) 39.0 - 52.0 %   MCV 89.5  78.0 - 100.0 fL   MCH 28.7  26.0 - 34.0 pg   MCHC 32.0  30.0 - 36.0 g/dL   RDW 14.4  11.5 - 15.5 %   Platelets 384  150 - 400 K/uL  BASIC METABOLIC PANEL     Status: Abnormal   Collection Time    09/23/13  4:25 AM      Result Value Ref Range   Sodium 142  137 - 147 mEq/L   Potassium 3.9  3.7 - 5.3 mEq/L   Chloride 103  96 - 112 mEq/L   CO2 25  19 - 32 mEq/L   Glucose, Bld 146 (*) 70 - 99 mg/dL   BUN 18  6 - 23 mg/dL   Creatinine, Ser 1.16  0.50 - 1.35 mg/dL   Calcium 8.8  8.4 - 10.5 mg/dL   GFR calc non Af Amer 69 (*) >90 mL/min   GFR calc Af Amer 80 (*) >90 mL/min   Comment: (NOTE)     The eGFR has been calculated using the CKD EPI equation.     This calculation has not been validated in all clinical situations.     eGFR's  persistently <90 mL/min signify possible Chronic Kidney     Disease.  GLUCOSE, CAPILLARY     Status: Abnormal   Collection Time    09/23/13  7:23 AM      Result Value Ref Range   Glucose-Capillary 144 (*) 70 - 99 mg/dL   Comment 1 Notify RN    GLUCOSE, CAPILLARY     Status: Abnormal   Collection Time    09/23/13 11:25 AM      Result Value Ref Range   Glucose-Capillary 114 (*) 70 - 99 mg/dL   Comment 1 Notify RN    OCCULT BLOOD X 1 CARD TO LAB, STOOL     Status: None   Collection Time    09/23/13  1:48 PM      Result Value Ref Range   Fecal Occult Bld NEGATIVE  NEGATIVE  GLUCOSE, CAPILLARY     Status: Abnormal   Collection Time    09/23/13  4:38 PM      Result Value Ref Range   Glucose-Capillary 140 (*) 70 - 99 mg/dL   Comment 1 Notify RN    GLUCOSE, CAPILLARY     Status: Abnormal   Collection Time    09/23/13  8:39 PM      Result Value Ref Range   Glucose-Capillary 112 (*) 70 - 99 mg/dL      HENT: dentition poor  Head: Normocephalic.  Eyes: EOM are normal.  Neck: Normal range of motion. Neck supple. No thyromegaly present.  Cardiovascular: Normal rate and regular rhythm. No murmurs  Respiratory: Effort normal and breath sounds normal. No respiratory distress.  GI: Soft. Bowel sounds are normal. He exhibits no distension.  Musculoskeletal:   Shoulder , elbow, wrist And hand ROM normal  Neurological: He is alert and oriented to person, place, and time. A sensory deficit is present.  5/5 strength Bilateral  delt, bi, tri, grip,  4/5 Bilateral HF and R KE  3- R ankle DF/PF  LLE hip anti gravity R Foot unchanged necrotic 1st, 2nd, 5th toes no surrounding erythema. Left leg without drainage  Ext no calf swelling or tenderness  Assessment/Plan: 1. Functional deficits secondary to Left BKA for gangrene (diabetic neuropathy, undiagnosed DM, no medical care) which require 3+ hours per day of interdisciplinary therapy in a comprehensive inpatient rehab setting. Physiatrist  is providing close team supervision and 24 hour management of active medical problems listed below. Physiatrist and rehab team continue to assess barriers to discharge/monitor patient progress toward functional and medical goals.  FIM: FIM - Bathing Bathing Steps Patient Completed: Chest;Right Arm;Left Arm;Abdomen;Front perineal area;Buttocks;Right upper leg;Left upper leg Bathing: 4: Min-Patient completes 8-9 63f 10 parts or 75+ percent  FIM - Upper Body Dressing/Undressing Upper body dressing/undressing: 0: Wears gown/pajamas-no public clothing FIM - Lower Body Dressing/Undressing Lower body dressing/undressing steps patient completed: Don/Doff right shoe;Fasten/unfasten right shoe;Pull pants up/down Lower body dressing/undressing: 5: Set-up assist to: Obtain clothing  FIM - Toileting Toileting steps completed by patient: Adjust clothing prior to toileting;Performs perineal hygiene;Adjust clothing after toileting Toileting Assistive Devices: Grab bar or rail for support Toileting: 5: Supervision: Safety issues/verbal cues  FIM - Radio producer Devices: Elevated toilet seat;Grab bars;Walker Toilet Transfers: 5-From toilet/BSC: Supervision (verbal cues/safety issues);5-To toilet/BSC: Supervision (verbal cues/safety issues)  FIM - Engineer, site Assistive Devices: Arm rests Bed/Chair Transfer: 5: Chair or W/C > Bed: Supervision (verbal cues/safety issues);5: Bed > Chair or W/C: Supervision (verbal cues/safety issues)  FIM - Locomotion: Wheelchair Distance: 150 Locomotion: Wheelchair: 6: Travels 150 ft or more, turns around, maneuvers to table, bed or toilet, negotiates 3% grade: maneuvers on rugs and over door sills independently FIM - Locomotion: Ambulation Locomotion: Ambulation Assistive Devices: Administrator Ambulation/Gait Assistance: 4: Min guard Locomotion: Ambulation: 1: Travels less than 50 ft with minimal assistance  (Pt.>75%)  Comprehension Comprehension Mode: Auditory Comprehension: 6-Follows complex conversation/direction: With extra time/assistive device  Expression Expression Mode: Verbal Expression: 6-Expresses complex ideas: With extra time/assistive device  Social Interaction Social Interaction: 6-Interacts appropriately with others with medication or extra time (anti-anxiety, antidepressant).  Problem Solving Problem Solving: 6-Solves complex problems: With extra time  Memory Memory: 6-More than reasonable amt of time  Medical Problem List and Plan:  Left BKA, and R necrotic toes  1. DVT Prophylaxis/Anticoagulation:   SQ lovenox.  2. Pain Management: Prn oxcycodone.  3. Mood: Does not express any signs of distress or concerns. LCSW to follow for evaluation.  4. Neuropsych: This patient is capable of making decisions on his own behalf.  5. DM type 2 poorly controlled: Monitor BS with ac/hs checks.   lantus changed to 70/30 insulin 15u bid with good results.  Use SSI for elevated BS and titrate as indicated.    6. Recurrent fever: resolved off abx, low grade yesterday  7. Anemia , guaic - x 1 neg, has had no medical care until this hospitalization, GI doubts bleed suspectinjg chronic disease , Hgb cont to drop rec transfusion pt elects to proceed , will do this pm 8. Vomiting, resolved may be related to tramadol    LOS (Days) [place] EVALUATION WAS PERFORMED  [name] E 09/24/2013, 6:25 AM

## 2013-09-25 ENCOUNTER — Inpatient Hospital Stay (HOSPITAL_COMMUNITY): Payer: Medicaid Other

## 2013-09-25 ENCOUNTER — Inpatient Hospital Stay (HOSPITAL_COMMUNITY): Payer: Medicaid Other | Admitting: Physical Therapy

## 2013-09-25 ENCOUNTER — Inpatient Hospital Stay (HOSPITAL_COMMUNITY): Payer: Medicaid Other | Admitting: Occupational Therapy

## 2013-09-25 ENCOUNTER — Inpatient Hospital Stay (HOSPITAL_COMMUNITY): Payer: Self-pay | Admitting: Occupational Therapy

## 2013-09-25 LAB — TYPE AND SCREEN
ABO/RH(D): O NEG
ANTIBODY SCREEN: NEGATIVE
Unit division: 0
Unit division: 0

## 2013-09-25 LAB — GLUCOSE, CAPILLARY
Glucose-Capillary: 117 mg/dL — ABNORMAL HIGH (ref 70–99)
Glucose-Capillary: 106 mg/dL — ABNORMAL HIGH (ref 70–99)
Glucose-Capillary: 130 mg/dL — ABNORMAL HIGH (ref 70–99)
Glucose-Capillary: 121 mg/dL — ABNORMAL HIGH (ref 70–99)

## 2013-09-25 LAB — HEMOGLOBIN AND HEMATOCRIT, BLOOD
HCT: 32.2 % — ABNORMAL LOW (ref 39.0–52.0)
Hemoglobin: 10.2 g/dL — ABNORMAL LOW (ref 13.0–17.0)

## 2013-09-25 NOTE — Progress Notes (Signed)
Occupational Therapy Session Note  Patient Details  Name: Kalix Schey MRN: [id number] Date of Birth: March 04, 1956  Today's Date: 09/25/2013 Time: [id number] Time Calculation (min): 25 min  Short Term Goals: Week 2:    STG = LTGs  Skilled Therapeutic Interventions/Progress Updates:    Pt seen for ADL retraining with focus on transfers and standing tolerance.  Upon arrival, pt reports "it's not a good time" due to having just received breakfast, waiting on RN to wrap residual limb, and reporting fatigued from transfusion.  Returned 20 mins later and pt performed stand pivot transfer to w/c supervision, donned Darco shoe with setup assist, and completed bathing and LB dressing at supervision level.  Pt returned to seated EOB at end of session to finish coffee.  Therapy Documentation Precautions:  Precautions Precautions: Fall Precaution Comments:   Required Braces or Orthoses: Other Brace/Splint Other Brace/Splint: R DARCO shoe when OOB Restrictions Weight Bearing Restrictions: Yes LLE Weight Bearing: Non weight bearing Other Position/Activity Restrictions: keep L knee straight/elevated when in w/c, bed General: General Amount of Missed OT Time (min): 20 Minutes Vital Signs: Therapy Vitals Temp: 97.7 F (36.5 C) Temp src: Oral Pulse Rate: 105 Resp: 17 BP: 129/62 mmHg Patient Position, if appropriate: Standing Oxygen Therapy SpO2: 90 % O2 Device: None (Room air) Pain:  Pt with no c/o pain  See FIM for current functional status  Therapy/Group: Individual Therapy  Deshay Blumenfeld, [place] 09/25/2013, 8:16 AM

## 2013-09-25 NOTE — Progress Notes (Signed)
Patient received transfusion of 1 unit PRBCs on 09/24/2013. Label on the blood was checked by this nurse and verified by Minerva Areola, RN. Transfusion started at 2216. Vitals prior to transfusion @ 2210: 98.4, 128/76, 85, 20, 96% 2L [place]. 15 minutes into transfusion, 2231: 98.8, 127/78, 86, 18, 96 2L [place]. No complications observed. Patient education on adverse reaction provided. Patient verbalized understanding. Patient status closely monitored throughout the transfusion therapy. Vitals remained within normal range, lung sounds cleared bilaterally, denied any adverse reactions (chills, chest pain, low back pain, SOB, itching...) Vitals upon completion of therapy: 137/82, 98.8, 88, 20, 96% 2L [place]. Patient responded well to therapy. Last vitals at 0012: 98.8, 129/79, 89, 20, 96% 2L [place]. Currently in bed sleeping. Call bell and personal items within reach. Andrue Dini A. Derry Arbogast, RN.

## 2013-09-25 NOTE — Progress Notes (Signed)
Social Work Patient ID: [name], male   DOB: [birth year], 58 y.o.   MRN: [id number] Met with pt to discuss team conference he is reaching his goals and team to QD him.  He is ready to leave the hospital tired of being here. He is aware no bed offers as of yet.  He is not interested in learning his insulin or blood sugars, per RN.  Discussed this it something he needs To learn if he ever plans to leave the NH.  He reports he will start or be more receptive to it.  He has SSD and Medicaid pending now.  His sister was here today And brought him clothes.  Continue to work on bed search.

## 2013-09-25 NOTE — Progress Notes (Signed)
Occupational Therapy Session Note  Patient Details  Name: Lyndol Romualdo MRN: [id number] Date of Birth: 1956/02/28  Today's Date: 09/25/2013 Time: [id number] Time Calculation (min): 30 min  Skilled Therapeutic Interventions/Progress Updates:    Pt rolled himself down to the therapy gym with modified independence.  Pt worked on UE therex exercises for strengthening using the blue theraband initially and transitioning to level 2 orange theraband as the blue was too difficulty for shoulder flexion without a lot of compensatory strategies.  Pt performed 10 repetitions of shoulder horizontal abduction and shoulder flexion bilaterally.  Performed 20 repetitions of elbow extension as well.  Finished session by having pt use the UE ergonometer for 3 mins on level 10 resistance with RPMs maintained 25-30.    Therapy Documentation Precautions:  Precautions Precautions: Fall Precaution Comments:   Required Braces or Orthoses: Other Brace/Splint Other Brace/Splint: R DARCO shoe when OOB Restrictions Weight Bearing Restrictions: Yes LLE Weight Bearing: Non weight bearing Other Position/Activity Restrictions: keep L knee straight/elevated when in w/c, bed  Pain: Pain Assessment Pain Assessment: No/denies pain ADL: See FIM for current functional status  Therapy/Group: Individual Therapy  Nyree Yonker OTR/L 09/25/2013, 3:42 PM

## 2013-09-25 NOTE — Progress Notes (Signed)
Physical Therapy Session Note  Patient Details  Name: [name] MRN: [id number] Date of Birth: 06-28-1956  Today's Date: 09/25/2013 Time: 1004-1106 and [id number] Time Calculation (min): 62 min and 42 min  Short Term Goals: No short term goals set  Skilled Therapeutic Interventions/Progress Updates:   H & H improved after transfusion but pt denies feeling better or having increased energy.  Performed bed mobility to EOB mod I and bed <> w/c <> mat transfers squat pivot supervision.  On mat performed dynamic sitting balance, trunk control, core and UE strengthening while sitting on pink balance disc during anterior/posterior/lateral pelvic tilts, pelvic tilts with ball tosses in various directions out of BOS, 12 reps each chest press, chest press with forwards and backwards rotations, overhead press, PNF D2 flexion <> extension to L and R with 5lb bar with intermittent rest breaks.  Performed gait training x 53' x 2 reps with RW straight navigation and changes in direction weaving L and R around cones with min A and prolonged rest break secondary to UE fatigue and for deep breathing.  Returned to room in w/c mod I; pt sister present in room to bring pt extra clothing.     PM session: Pt resting in bed; pt in better spirits this pm.  Performed bed mobility mod I and transfer to w/c squat pivot supervision.  Replaced board/towel under L BKA with leg rest with amputee support pad to allow for positioning during rest breaks in w/c and increased independence with w/c parts management and set up (pt unable to remove and replace board); educated pt on how to swing pad/leg rest away and replace it.  In gym performed 2 sets x 12 reps each standing LLE open chain hamstring curls, hip extension, hip ABD and squats with UE support on // bars and supervision. Also performed 5 reps forwards and retro hops off of 4" step with bilat UE support on // bars and min A.  Pt with improved standing endurance; able to  complete all exercises before requiring a break and decreased SOB.  Pt reporting need to have a BM.  Transferred stand pivot w/c > toilet with grab bars with min A.  Stood with supervision and doffed pants with one UE and supervision.  Pt to return to w/c with nursing staff.    Therapy Documentation Precautions:  Precautions Precautions: Fall Precaution Comments:   Required Braces or Orthoses: Other Brace/Splint Other Brace/Splint: R DARCO shoe when OOB Restrictions Weight Bearing Restrictions: Yes LLE Weight Bearing: Non weight bearing Other Position/Activity Restrictions: keep L knee straight/elevated when in w/c, bed Pain: Pain Assessment Pain Assessment: No/denies pain Pain Score: 0-No pain Locomotion : Ambulation Ambulation/Gait Assistance: 4: Min guard Wheelchair Mobility Distance: 150   See FIM for current functional status  Therapy/Group: Individual Therapy  Raylene Everts [facility] 09/25/2013, 11:33 AM

## 2013-09-25 NOTE — Patient Care Conference (Signed)
Inpatient RehabilitationTeam Conference and Plan of Care Update Date: 09/25/2013   Time: 11:20 AM    Patient Name: [name]      Medical Record Number: [id number]  Date of Birth: 1955-10-05 Sex: Male         Room/Bed: 4M04C/4M04C-01 Payor Info: Payor: /    Admitting Diagnosis: L BKA  Admit Date/Time:  09/13/2013  4:36 PM Admission Comments: No comment available   Primary Diagnosis:  Unilateral complete BKA Principal Problem: Unilateral complete BKA  Patient Active Problem List   Diagnosis Date Noted  . Gangrene of toe 09/16/2013  . Fever with chills 09/16/2013  . Unilateral complete BKA 09/13/2013  . DKA (diabetic ketoacidoses) 09/04/2013  . 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 DM (diabetes mellitus) type II controlled, neurological manifestation 09/01/2013    Expected Discharge Date:    Team Members Present: Physician leading conference: Dr. Alysia Penna Social Worker Present: Alfonse Alpers, LCSW;Becky Dannisha Eckmann, LCSW Nurse Present: Other (comment) Alona Bene Averno-RN) PT Present: Raylene Everts, PT;Emily Rinaldo Cloud, PT OT Present: [name], OT;Kris Nira Retort, OT SLP Present: Gunnar Fusi, SLP PPS Coordinator present : Ileana Ladd, Lelan Pons, RN, CRRN     Current Status/Progress Goal Weekly Team Focus  Medical   Anemia likely chronic disease, diabetes  Maintain medical stability for  Monitor hemoglobin   Bowel/Bladder   Continent of bowel and bladder. LBM 09/24/13  Pt to remain continent of bowel and bladder  Monitor   Swallow/Nutrition/ Hydration     [facility]        ADL's   supervision bathing, supervision transfers from w/c, min-supervision RW, still no clothes  supervision overall, mod I toileting  activity tolerance/endurance, standing balance, RLE care/limb wrapping   Mobility   Mod I bed mobility, w/c mobility; supervision transfers and gait short distances  Goals downgraded for NHP; mod I bed mobility, bed <> w/c  transfers and w/c mobility, supervision gait  Standing balance and endurance for gait, UE and LE strength   Communication     [facility]        Safety/Cognition/ Behavioral Observations    No unsafe behaviors        Pain     Pain managed-no complaints of pain        Skin   L BKA with sutures intact. Incisional line approximated. R hallux, 2nd and 5th toe necrotic, Heel, callous and cracked  No additional skin breakdown  Daily drsg change, Asess skin q shift      *See Care Plan and progress notes for long and short-term goals.  Barriers to Discharge: Sisters house is not set up well for handicapped    Possible Resolutions to Barriers:  May need DC to rest home    Discharge Planning/Teaching Needs:  Searching for NH bed, pt has applied for Medicaid last Friday.  SSD and Medicaid pending now      Team Discussion:  QD in therapies, RN trying to education BS and insulin and pt resistant.  Wanting to leave tired of the hospital but aware no bed offer as of yet.  Blood transfusion yesterday.  Neuro-psych to see Monday  Revisions to Treatment Plan:  Pursuing NHP   Continued Need for Acute Rehabilitation Level of Care: The patient requires daily medical management by a physician with specialized training in physical medicine and rehabilitation for the following conditions: Daily direction of a multidisciplinary physical rehabilitation program to ensure safe treatment while eliciting the highest outcome that is of  practical value to the patient.: Yes Daily medical management of patient stability for increased activity during participation in an intensive rehabilitation regime.: Yes Daily analysis of laboratory values and/or radiology reports with any subsequent need for medication adjustment of medical intervention for : Neurological problems;Other  [name], [name] 09/25/2013, 1:54 PM

## 2013-09-25 NOTE — Progress Notes (Signed)
Subjective/Complaints: 58 y.o. male with history of diabetes mellitus, HTN, h/o hepatitis, BLE numbness since frostbite last year but no medical care. He has been living in his car for past 4-5 years (had declined opportunity to live with family) who was brought to ED on 09/01/13 by family due to inability to care for self, confusion as well as left foot infection. X rays with extensive soft tissue gas consistent with cellulitis and abscess formation. He was found to have diabetic ketoacidosis and treated with insulin infusion. He was evaluated by Dr. Erlinda Hong and placed on IV antibiotics. His foot culture positive for proteus mirabilis--pan sensitive. MRI with for concern for necrotizing fascitis and patient taken to OR for I & D with 4 th and 5th toe amputation with placement of VAC. Also noted to have necrotic 1st, 2nd and 5 th toes on the right and likey allow auto amputation per ortho. 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 rate 17, height $RemoveBe'5\' 3"'RuGHghrKG$  (1.6 m), weight 65.1 kg (143 lb 8.3 oz), SpO2 90.00%. No results found. Results for orders placed during the hospital encounter of 09/13/13 (from the past 72 hour(s))  GLUCOSE, CAPILLARY     Status: Abnormal   Collection Time    09/22/13  7:06 AM      Result Value Ref Range   Glucose-Capillary 170 (*) 70 - 99 mg/dL   Comment 1 Notify RN    GLUCOSE, CAPILLARY     Status: Abnormal   Collection Time    09/22/13 11:34 AM      Result Value Ref Range   Glucose-Capillary 168 (*) 70 - 99 mg/dL   Comment 1 Notify RN    GLUCOSE, CAPILLARY     Status: Abnormal   Collection Time    09/22/13  4:50 PM      Result Value Ref Range   Glucose-Capillary 198 (*) 70 - 99 mg/dL   Comment 1 Notify RN    GLUCOSE, CAPILLARY     Status: Abnormal   Collection Time    09/22/13  8:42 PM      Result Value Ref Range   Glucose-Capillary 133 (*) 70 - 99 mg/dL  CBC     Status: Abnormal   Collection Time    09/23/13  4:25 AM      Result Value Ref Range   WBC 8.1  4.0 - 10.5 K/uL   RBC 2.58 (*) 4.22 - 5.81 MIL/uL   Hemoglobin 7.4 (*) 13.0 - 17.0 g/dL   HCT 23.1 (*) 39.0 - 52.0 %   MCV 89.5  78.0 - 100.0 fL   MCH 28.7  26.0 - 34.0 pg   MCHC 32.0  30.0 - 36.0 g/dL   RDW 14.4  11.5 - 15.5 %   Platelets 384  150 - 400 K/uL  BASIC METABOLIC PANEL     Status: Abnormal   Collection Time    09/23/13  4:25 AM      Result Value Ref Range   Sodium 142  137 - 147 mEq/L   Potassium 3.9  3.7 - 5.3 mEq/L   Chloride 103  96 - 112 mEq/L   CO2 25  19 - 32 mEq/L  Glucose, Bld 146 (*) 70 - 99 mg/dL   BUN 18  6 - 23 mg/dL   Creatinine, Ser 1.16  0.50 - 1.35 mg/dL   Calcium 8.8  8.4 - 10.5 mg/dL   GFR calc non Af Amer 69 (*) >90 mL/min   GFR calc Af Amer 80 (*) >90 mL/min   Comment: (NOTE)     The eGFR has been calculated using the CKD EPI equation.     This calculation has not been validated in all clinical situations.     eGFR's persistently <90 mL/min signify possible Chronic Kidney     Disease.  GLUCOSE, CAPILLARY     Status: Abnormal   Collection Time    09/23/13  7:23 AM      Result Value Ref Range   Glucose-Capillary 144 (*) 70 - 99 mg/dL   Comment 1 Notify RN    GLUCOSE, CAPILLARY     Status: Abnormal   Collection Time    09/23/13 11:25 AM      Result Value Ref Range   Glucose-Capillary 114 (*) 70 - 99 mg/dL   Comment 1 Notify RN    OCCULT BLOOD X 1 CARD TO LAB, STOOL     Status: None   Collection Time    09/23/13  1:48 PM      Result Value Ref Range   Fecal Occult Bld NEGATIVE  NEGATIVE  GLUCOSE, CAPILLARY     Status: Abnormal   Collection Time    09/23/13  4:38 PM      Result Value Ref Range   Glucose-Capillary 140  (*) 70 - 99 mg/dL   Comment 1 Notify RN    GLUCOSE, CAPILLARY     Status: Abnormal   Collection Time    09/23/13  8:39 PM      Result Value Ref Range   Glucose-Capillary 112 (*) 70 - 99 mg/dL  TYPE AND SCREEN     Status: None   Collection Time    09/24/13  7:15 AM      Result Value Ref Range   ABO/RH(D) O NEG     Antibody Screen NEG     Sample Expiration 09/27/2013     Unit Number [id number]     Blood Component Type RED CELLS,LR     Unit division 00     Status of Unit ISSUED     Transfusion Status OK TO TRANSFUSE     Crossmatch Result Compatible     Unit Number [id number]     Blood Component Type RBC LR PHER2     Unit division 00     Status of Unit ISSUED     Transfusion Status OK TO TRANSFUSE     Crossmatch Result Compatible    PREPARE RBC (CROSSMATCH)     Status: None   Collection Time    09/24/13  7:15 AM      Result Value Ref Range   Order Confirmation ORDER PROCESSED BY BLOOD BANK    ABO/RH     Status: None   Collection Time    09/24/13  7:15 AM      Result Value Ref Range   ABO/RH(D) O NEG    GLUCOSE, CAPILLARY     Status: Abnormal   Collection Time    09/24/13  7:49 AM      Result Value Ref Range   Glucose-Capillary 129 (*) 70 - 99 mg/dL   Comment 1 Notify RN    GLUCOSE, CAPILLARY  Status: Abnormal   Collection Time    09/24/13 11:59 AM      Result Value Ref Range   Glucose-Capillary 101 (*) 70 - 99 mg/dL   Comment 1 Notify RN    GLUCOSE, CAPILLARY     Status: Abnormal   Collection Time    09/24/13  4:22 PM      Result Value Ref Range   Glucose-Capillary 148 (*) 70 - 99 mg/dL   Comment 1 Notify RN    GLUCOSE, CAPILLARY     Status: None   Collection Time    09/24/13  8:30 PM      Result Value Ref Range   Glucose-Capillary 81  70 - 99 mg/dL   Comment 1 Notify RN    HEMOGLOBIN AND HEMATOCRIT, BLOOD     Status: Abnormal   Collection Time    09/25/13  6:10 AM      Result Value Ref Range   Hemoglobin 10.2 (*) 13.0 - 17.0 g/dL   HCT 32.2 (*)  39.0 - 52.0 %      HENT: dentition poor  Head: Normocephalic.  Eyes: EOM are normal.  Neck: Normal range of motion. Neck supple. No thyromegaly present.  Cardiovascular: Normal rate and regular rhythm. No murmurs  Respiratory: Effort normal and breath sounds normal. No respiratory distress.  GI: Soft. Bowel sounds are normal. He exhibits no distension.  Musculoskeletal:   Shoulder , elbow, wrist And hand ROM normal  Neurological: He is alert and oriented to person, place, and time. A sensory deficit is present.  5/5 strength Bilateral delt, bi, tri, grip,  4/5 Bilateral HF and R KE  3- R ankle DF/PF  LLE hip anti gravity R Foot unchanged necrotic 1st, 2nd, 5th toes no surrounding erythema. Left leg without drainage  Ext no calf swelling or tenderness  Assessment/Plan: 1. Functional deficits secondary to Left BKA for gangrene (diabetic neuropathy, undiagnosed DM, no medical care) which require 3+ hours per day of interdisciplinary therapy in a comprehensive inpatient rehab setting. Physiatrist is providing close team supervision and 24 hour management of active medical problems listed below. Physiatrist and rehab team continue to assess barriers to discharge/monitor patient progress toward functional and medical goals.  FIM: FIM - Bathing Bathing Steps Patient Completed: Chest;Right Arm;Left Arm;Abdomen;Front perineal area Bathing: 3: Mod-Patient completes 5-7 78f 10 parts or 50-74% (pt declined to bathe LB)  FIM - Upper Body Dressing/Undressing Upper body dressing/undressing: 0: Wears gown/pajamas-no public clothing FIM - Lower Body Dressing/Undressing Lower body dressing/undressing steps patient completed: Don/Doff right shoe;Fasten/unfasten right shoe;Pull pants up/down Lower body dressing/undressing: 0: Wears gown/pajamas-no public clothing  FIM - Toileting Toileting steps completed by patient: Adjust clothing prior to toileting;Performs perineal hygiene;Adjust clothing  after toileting Toileting Assistive Devices: Grab bar or rail for support Toileting: 5: Supervision: Safety issues/verbal cues  FIM - Radio producer Devices: Grab bars Toilet Transfers: 5-From toilet/BSC: Supervision (verbal cues/safety issues);5-To toilet/BSC: Supervision (verbal cues/safety issues)  FIM - Engineer, site Assistive Devices: Arm rests Bed/Chair Transfer: 6: Supine > Sit: No assist;6: Sit > Supine: No assist;5: Bed > Chair or W/C: Supervision (verbal cues/safety issues);5: Chair or W/C > Bed: Supervision (verbal cues/safety issues)  FIM - Locomotion: Wheelchair Distance: 150 Locomotion: Wheelchair: 6: Travels 150 ft or more, turns around, maneuvers to table, bed or toilet, negotiates 3% grade: maneuvers on rugs and over door sills independently FIM - Locomotion: Ambulation Locomotion: Ambulation Assistive Devices: Administrator Ambulation/Gait Assistance: 4: Min guard  Locomotion: Ambulation: 1: Travels less than 50 ft with minimal assistance (Pt.>75%)  Comprehension Comprehension Mode: Auditory Comprehension: 6-Follows complex conversation/direction: With extra time/assistive device  Expression Expression Mode: Verbal Expression: 6-Expresses complex ideas: With extra time/assistive device  Social Interaction Social Interaction: 6-Interacts appropriately with others with medication or extra time (anti-anxiety, antidepressant).  Problem Solving Problem Solving: 6-Solves complex problems: With extra time  Memory Memory: 6-More than reasonable amt of time  Medical Problem List and Plan:  Left BKA, and R necrotic toes  1. DVT Prophylaxis/Anticoagulation:   SQ lovenox.  2. Pain Management: Prn oxcycodone.  3. Mood: Does not express any signs of distress or concerns. LCSW to follow for evaluation.  4. Neuropsych: This patient is capable of making decisions on his own behalf.  5. DM type 2 poorly controlled:  Monitor BS with ac/hs checks.   lantus changed to 70/30 insulin 15u bid with good results.  Use SSI for elevated BS and titrate as indicated.    6. Recurrent fever: resolved off abx, low grade yesterday  7. Anemia , guaic - x 1 neg, has had no medical care until this hospitalization, GI doubts bleed suspectinjg chronic disease , f/u H and H pending 8. Vomiting, resolved may be related to tramadol    LOS (Days) 12 A FACE TO FACE EVALUATION WAS PERFORMED  Tiara Bartoli E 09/25/2013, 6:55 AM

## 2013-09-26 ENCOUNTER — Inpatient Hospital Stay (HOSPITAL_COMMUNITY): Payer: Medicaid Other

## 2013-09-26 ENCOUNTER — Inpatient Hospital Stay (HOSPITAL_COMMUNITY): Payer: Medicaid Other | Admitting: Occupational Therapy

## 2013-09-26 LAB — GLUCOSE, CAPILLARY
GLUCOSE-CAPILLARY: 144 mg/dL — AB (ref 70–99)
Glucose-Capillary: 129 mg/dL — ABNORMAL HIGH (ref 70–99)
Glucose-Capillary: 108 mg/dL — ABNORMAL HIGH (ref 70–99)
Glucose-Capillary: 194 mg/dL — ABNORMAL HIGH (ref 70–99)

## 2013-09-26 NOTE — Progress Notes (Signed)
Subjective/Complaints: 58 y.o. male with history of diabetes mellitus, HTN, h/o hepatitis, BLE numbness since frostbite last year but no medical care. He has been living in his car for past 4-5 years (had declined opportunity to live with family) who was brought to ED on 09/01/13 by family due to inability to care for self, confusion as well as left foot infection. X rays with extensive soft tissue gas consistent with cellulitis and abscess formation. He was found to have diabetic ketoacidosis and treated with insulin infusion. He was evaluated by Dr. Erlinda Hong and placed on IV antibiotics. His foot culture positive for proteus mirabilis--pan sensitive. MRI with for concern for necrotizing fascitis and patient taken to OR for I & D with 4 th and 5th toe amputation with placement of VAC. Also noted to have necrotic 1st, 2nd and 5 th toes on the right and likey allow auto amputation per ortho. 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 rate 18, height 5\' 3"  (1.6 m), weight 66.5 kg (146 lb 9.7 oz), SpO2 98.00%. Dg Chest 2 View  09/25/2013   CLINICAL DATA:  Hypoxia  EXAM: CHEST  2 VIEW  COMPARISON:  09/15/2013  FINDINGS: Cardiomediastinal silhouette is stable. Mild degenerative changes thoracic spine. There is left base posteriorly atelectasis or infiltrate best seen on lateral view. No pulmonary edema.  IMPRESSION: No pulmonary edema. Left base posteriorly atelectasis or infiltrate best seen on lateral view.   Electronically Signed   By: Lahoma Crocker M.D.   On: 09/25/2013 09:47   Results for orders placed during the hospital encounter of 09/13/13 (from the past 72 hour(s))  OCCULT BLOOD X 1 CARD TO LAB, STOOL     Status:  None   Collection Time    09/23/13  1:48 PM      Result Value Ref Range   Fecal Occult Bld NEGATIVE  NEGATIVE  GLUCOSE, CAPILLARY     Status: Abnormal   Collection Time    09/23/13  4:38 PM      Result Value Ref Range   Glucose-Capillary 140 (*) 70 - 99 mg/dL   Comment 1 Notify RN    GLUCOSE, CAPILLARY     Status: Abnormal   Collection Time    09/23/13  8:39 PM      Result Value Ref Range   Glucose-Capillary 112 (*) 70 - 99 mg/dL  TYPE AND SCREEN     Status: None   Collection Time    09/24/13  7:15 AM      Result Value Ref Range   ABO/RH(D) O NEG     Antibody Screen NEG     Sample Expiration 09/27/2013     Unit Number [id number]     Blood Component Type RED CELLS,LR     Unit division 00     Status of Unit ISSUED,FINAL     Transfusion Status OK TO TRANSFUSE     Crossmatch Result Compatible     Unit Number [id number]     Blood Component Type RBC LR PHER2     Unit division 00     Status of Unit ISSUED,FINAL     Transfusion Status OK TO TRANSFUSE     Crossmatch Result Compatible    PREPARE RBC (  CROSSMATCH)     Status: None   Collection Time    09/24/13  7:15 AM      Result Value Ref Range   Order Confirmation ORDER PROCESSED BY BLOOD BANK    ABO/RH     Status: None   Collection Time    09/24/13  7:15 AM      Result Value Ref Range   ABO/RH(D) O NEG    GLUCOSE, CAPILLARY     Status: Abnormal   Collection Time    09/24/13  7:49 AM      Result Value Ref Range   Glucose-Capillary 129 (*) 70 - 99 mg/dL   Comment 1 Notify RN    GLUCOSE, CAPILLARY     Status: Abnormal   Collection Time    09/24/13 11:59 AM      Result Value Ref Range   Glucose-Capillary 101 (*) 70 - 99 mg/dL   Comment 1 Notify RN    GLUCOSE, CAPILLARY     Status: Abnormal   Collection Time    09/24/13  4:22 PM      Result Value Ref Range   Glucose-Capillary 148 (*) 70 - 99 mg/dL   Comment 1 Notify RN    GLUCOSE, CAPILLARY     Status: None   Collection Time    09/24/13  8:30 PM      Result  Value Ref Range   Glucose-Capillary 81  70 - 99 mg/dL   Comment 1 Notify RN    HEMOGLOBIN AND HEMATOCRIT, BLOOD     Status: Abnormal   Collection Time    09/25/13  6:10 AM      Result Value Ref Range   Hemoglobin 10.2 (*) 13.0 - 17.0 g/dL   HCT 32.2 (*) 39.0 - 52.0 %  GLUCOSE, CAPILLARY     Status: Abnormal   Collection Time    09/25/13  7:34 AM      Result Value Ref Range   Glucose-Capillary 117 (*) 70 - 99 mg/dL  GLUCOSE, CAPILLARY     Status: Abnormal   Collection Time    09/25/13 11:40 AM      Result Value Ref Range   Glucose-Capillary 106 (*) 70 - 99 mg/dL  GLUCOSE, CAPILLARY     Status: Abnormal   Collection Time    09/25/13  5:03 PM      Result Value Ref Range   Glucose-Capillary 130 (*) 70 - 99 mg/dL  GLUCOSE, CAPILLARY     Status: Abnormal   Collection Time    09/25/13  8:25 PM      Result Value Ref Range   Glucose-Capillary 121 (*) 70 - 99 mg/dL   Comment 1 Notify RN    GLUCOSE, CAPILLARY     Status: Abnormal   Collection Time    09/26/13  7:11 AM      Result Value Ref Range   Glucose-Capillary 129 (*) 70 - 99 mg/dL   Comment 1 Notify RN        HENT: dentition poor  Head: Normocephalic.  Eyes: EOM are normal.  Neck: Normal range of motion. Neck supple. No thyromegaly present.  Cardiovascular: Normal rate and regular rhythm. No murmurs  Respiratory: Effort normal and breath sounds normal. No respiratory distress.  GI: Soft. Bowel sounds are normal. He exhibits no distension.  Musculoskeletal:   Shoulder , elbow, wrist And hand ROM normal  Neurological: He is alert and oriented to person, place, and time. A sensory deficit is present.  5/5  strength Bilateral delt, bi, tri, grip,  4/5 Bilateral HF and R KE  3- R ankle DF/PF  LLE hip anti gravity R Foot unchanged necrotic 1st, 2nd, 5th toes no surrounding erythema. Left leg without drainage  Ext no calf swelling or tenderness  Assessment/Plan: 1. Functional deficits secondary to Left BKA for gangrene  (diabetic neuropathy, undiagnosed DM, no medical care) which require 3+ hours per day of interdisciplinary therapy in a comprehensive inpatient rehab setting. Physiatrist is providing close team supervision and 24 hour management of active medical problems listed below. Physiatrist and rehab team continue to assess barriers to discharge/monitor patient progress toward functional and medical goals.  FIM: FIM - Bathing Bathing Steps Patient Completed: Chest;Right Arm;Left Arm;Abdomen;Front perineal area;Buttocks;Right upper leg;Left upper leg Bathing: 4: Min-Patient completes 8-9 79f 10 parts or 75+ percent  FIM - Upper Body Dressing/Undressing Upper body dressing/undressing: 0: Wears gown/pajamas-no public clothing FIM - Lower Body Dressing/Undressing Lower body dressing/undressing steps patient completed: Thread/unthread right pants leg;Thread/unthread left pants leg;Pull pants up/down;Don/Doff right shoe;Fasten/unfasten right shoe Lower body dressing/undressing: 5: Supervision: Safety issues/verbal cues  FIM - Toileting Toileting steps completed by patient: Adjust clothing prior to toileting;Performs perineal hygiene;Adjust clothing after toileting Toileting Assistive Devices: Grab bar or rail for support Toileting: 5: Supervision: Safety issues/verbal cues  FIM - Radio producer Devices: Grab bars Toilet Transfers: 5-From toilet/BSC: Supervision (verbal cues/safety issues);5-To toilet/BSC: Supervision (verbal cues/safety issues)  FIM - Engineer, site Assistive Devices: Arm rests Bed/Chair Transfer: 6: Supine > Sit: No assist;6: Sit > Supine: No assist;5: Bed > Chair or W/C: Supervision (verbal cues/safety issues);5: Chair or W/C > Bed: Supervision (verbal cues/safety issues)  FIM - Locomotion: Wheelchair Distance: 150 Locomotion: Wheelchair: 6: Travels 150 ft or more, turns around, maneuvers to table, bed or toilet, negotiates 3%  grade: maneuvers on rugs and over door sills independently FIM - Locomotion: Ambulation Locomotion: Ambulation Assistive Devices: Administrator Ambulation/Gait Assistance: 4: Min guard Locomotion: Ambulation: 2: Travels 50 - 149 ft with minimal assistance (Pt.>75%)  Comprehension Comprehension Mode: Auditory Comprehension: 6-Follows complex conversation/direction: With extra time/assistive device  Expression Expression Mode: Verbal Expression: 6-Expresses complex ideas: With extra time/assistive device  Social Interaction Social Interaction: 6-Interacts appropriately with others with medication or extra time (anti-anxiety, antidepressant).  Problem Solving Problem Solving: 6-Solves complex problems: With extra time  Memory Memory: 6-More than reasonable amt of time  Medical Problem List and Plan:  Left BKA, and R necrotic toes  1. DVT Prophylaxis/Anticoagulation:   SQ lovenox.  2. Pain Management: Prn oxcycodone.  3. Mood: Does not express any signs of distress or concerns. LCSW to follow for evaluation.  4. Neuropsych: This patient is capable of making decisions on his own behalf.  5. DM type 2 poorly controlled: Monitor BS with ac/hs checks.   lantus changed to 70/30 insulin 15u bid with good results.  Use SSI for elevated BS and titrate as indicated.    6. Recurrent fever: resolved off abx, low grade yesterday  7. Anemia , guaic - x 1 neg, has had no medical care until this hospitalization, GI doubts bleed suspectinjg chronic disease , f/u H and H pending 8. Vomiting, resolved may be related to tramadol    LOS (Days) [place] EVALUATION WAS PERFORMED  Sandro Burgo E 09/26/2013, 12:35 PM

## 2013-09-26 NOTE — Progress Notes (Signed)
Physical Therapy Note  Patient Details  Name: [name] MRN: [id number] Date of Birth: 06/24/1956 Today's Date: 09/26/2013  10:20 - 11:15 55 minutes Individual session Patient denies pain.  Patient sitting in wheelchair upon entering room. Patient propelled wheelchair using UE's 150+ feet mod I. Patient performed 12 reps each standing LLE open chain hamstring curls, hip extension, hip ABD and hip flexion with UE support on // bars and supervision. Patient also performed 5 reps forwards and retro hops off of 4" step with bilat UE support on // bars with supervision. Patient ambulated 80 feet with rolling walker and close supervision. Patient ambulated around obstacles with RW and supervision to simulate home setting. Patient performed LAQ's and marching x 12 reps in sitting with right LE. Patient exercised on Nustep x 8 minutes at workload 6 for strength and endurance.  Patient propelled wheelchair 150 feet to room and was left with all items in reach.   [name] 09/26/2013, 1:20 PM

## 2013-09-26 NOTE — Progress Notes (Signed)
Occupational Therapy Session Note  Patient Details  Name: Kap Miltimore MRN: [id number] Date of Birth: [birth year]  Today's Date: 09/26/2013 Time: 1115-1200 Time Calculation (min): 45 min  Short Term Goals: Week 2:    STG = LTGs  Skilled Therapeutic Interventions/Progress Updates:    Engaged in therapeutic exercise with focus on BUE strengthening to increase functional independence.  Engaged in [place] exercises with orange theraband in sitting on compliant surface to further challenge trunk control and balance.  Pt completed 2 reps of 12 each arm with shoulder flexion, horizontal abduction, and elbow flexion.  Completed 2 reps of 12 pushups from pushup blocks.  O2 and HR monitored throughout session, with HR in 100s-110s after activity.  Pt propelled w/c back to room and setup w/c for transfer supervision.  Therapy Documentation Precautions:  Precautions Precautions: Fall Precaution Comments:   Required Braces or Orthoses: Other Brace/Splint Other Brace/Splint: R DARCO shoe when OOB Restrictions Weight Bearing Restrictions: Yes LLE Weight Bearing: Non weight bearing Other Position/Activity Restrictions: keep L knee straight/elevated when in w/c, bed Pain:  Pt with no c/o pain this session.  See FIM for current functional status  Therapy/Group: Individual Therapy  [name] 09/26/2013, 12:03 PM

## 2013-09-27 ENCOUNTER — Inpatient Hospital Stay (HOSPITAL_COMMUNITY): Payer: Medicaid Other | Admitting: Physical Therapy

## 2013-09-27 ENCOUNTER — Inpatient Hospital Stay (HOSPITAL_COMMUNITY): Payer: Medicaid Other | Admitting: Occupational Therapy

## 2013-09-27 LAB — GLUCOSE, CAPILLARY
GLUCOSE-CAPILLARY: 128 mg/dL — AB (ref 70–99)
Glucose-Capillary: 143 mg/dL — ABNORMAL HIGH (ref 70–99)
Glucose-Capillary: 99 mg/dL (ref 70–99)
Glucose-Capillary: 183 mg/dL — ABNORMAL HIGH (ref 70–99)

## 2013-09-27 NOTE — Progress Notes (Signed)
Physical Therapy Weekly Progress Note  Patient Details  Name: [name] MRN: [id number] Date of Birth: March 26, 1956  Today's Date: 09/27/2013 Time: 5638-7564 Time Calculation (min): 36 min  Patient continues to make good progress and has met 3 of 6 long term goals.  Short term goals not set due to estimated length of stay on evaluation.  Secondary to pt not having a safe D/C destination pt awaiting NHP and all home goals D/C.  Pt is mod I for bed mobility and w/c mobility but continues to require supervision for bed <> w/c transfers and gait with RW secondary to pt requiring intermittent verbal cues for set up and safety.  Pt is demonstrating improved activity tolerance and cardiorespiratory endurance s/p transfusion.     Patient continues to demonstrate the following deficits: bilat UE and LE and core weakness, impaired activity tolerance, balance, gait and therefore will continue to benefit from skilled PT intervention to enhance overall performance with activity tolerance, balance, postural control and functional use of  right upper extremity, right lower extremity and left upper extremity.  See Patient's Care Plan for progression toward long term goals.  Patient progressing toward long term goals..  Continue plan of care.  Skilled Therapeutic Interventions/Progress Updates:   Pt resting in bed; pt reporting sleeping in funny position last night and waking up with sharp intercostal pain on L. RN and MD aware.  Pt transferred supine >sit and bed >w/c <> mat squat pivot mod I with extra time needed to amputee support pad donning on w/c.  Pt required verbal cues for problem solving safe set up of w/c to transfer to mat; pt attempting to keep amputee support pad on w/c on L (transferring to L) but unable to lock brake.  Pt advised to remove pad in order to fully lock w/c.  Performed w/c mobility mod I.  On mat performed 2 sets x 12-15 reps prone open chain hamstring curls and hip extension  bilaterally; transitioned to sidelying on L and R and performed open chain hip ABD x 15 reps bilaterally.  Transitioned to long sitting on mat with back against a wall with LE in V; performed 2 reps lateral lean to R elbow with LUE flexed overhead and focus on pushing L buttocks back down to mat for lateral flank and thoracic stretch/elongation to relieve pain in L side.  Pt noted immediate improvement in pain.  Returned to w/c and to room to rest before lunch.    Therapy Documentation Precautions:  Precautions Precautions: Fall Precaution Comments:   Required Braces or Orthoses: Other Brace/Splint Other Brace/Splint: R DARCO shoe when OOB Restrictions Weight Bearing Restrictions: Yes LLE Weight Bearing: Non weight bearing Other Position/Activity Restrictions: keep L knee straight/elevated when in w/c, bed Pain: Pain Assessment Pain Assessment: 0-10 Pain Score: 5  Pain Type: Acute pain Pain Location: Flank Pain Orientation: Left Pain Descriptors / Indicators: Sharp Pain Onset: Gradual (onset this am) Pain Intervention(s): Other (Comment) (exercise/stretching) Locomotion : Wheelchair Mobility Distance: 150   See FIM for current functional status  Therapy/Group: Individual Therapy  Raylene Everts [facility] 09/27/2013, 12:03 PM

## 2013-09-27 NOTE — Progress Notes (Signed)
Subjective/Complaints: 58 y.o. male with history of diabetes mellitus, HTN, h/o hepatitis, BLE numbness since frostbite last year but no medical care. He has been living in his car for past 4-5 years (had declined opportunity to live with family) who was brought to ED on 09/01/13 by family due to inability to care for self, confusion as well as left foot infection. X rays with extensive soft tissue gas consistent with cellulitis and abscess formation. He was found to have diabetic ketoacidosis and treated with insulin infusion. He was evaluated by Dr. Erlinda Hong and placed on IV antibiotics. His foot culture positive for proteus mirabilis--pan sensitive. MRI with for concern for necrotizing fascitis and patient taken to OR for I & D with 4 th and 5th toe amputation with placement of VAC. Also noted to have necrotic 1st, 2nd and 5 th toes on the right and likey allow auto amputation per ortho. 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 rate 18, height 5\' 3"  (1.6 m), weight 66.5 kg (146 lb 9.7 oz), SpO2 95.00%. No results found. Results for orders placed during the hospital encounter of 09/13/13 (from the past 72 hour(s))  GLUCOSE, CAPILLARY     Status: Abnormal   Collection Time    09/24/13 11:59 AM      Result Value Ref Range   Glucose-Capillary 101 (*) 70 - 99 mg/dL   Comment 1 Notify RN    GLUCOSE, CAPILLARY     Status: Abnormal   Collection Time    09/24/13  4:22 PM      Result Value Ref Range   Glucose-Capillary 148 (*) 70 - 99 mg/dL   Comment 1 Notify RN    GLUCOSE, CAPILLARY     Status: None   Collection Time    09/24/13  8:30 PM      Result Value Ref Range   Glucose-Capillary 81  70 - 99 mg/dL   Comment 1 Notify RN    HEMOGLOBIN AND HEMATOCRIT, BLOOD      Status: Abnormal   Collection Time    09/25/13  6:10 AM      Result Value Ref Range   Hemoglobin 10.2 (*) 13.0 - 17.0 g/dL   HCT 32.2 (*) 39.0 - 52.0 %  GLUCOSE, CAPILLARY     Status: Abnormal   Collection Time    09/25/13  7:34 AM      Result Value Ref Range   Glucose-Capillary 117 (*) 70 - 99 mg/dL  GLUCOSE, CAPILLARY     Status: Abnormal   Collection Time    09/25/13 11:40 AM      Result Value Ref Range   Glucose-Capillary 106 (*) 70 - 99 mg/dL  GLUCOSE, CAPILLARY     Status: Abnormal   Collection Time    09/25/13  5:03 PM      Result Value Ref Range   Glucose-Capillary 130 (*) 70 - 99 mg/dL  GLUCOSE, CAPILLARY     Status: Abnormal   Collection Time    09/25/13  8:25 PM      Result Value Ref Range   Glucose-Capillary 121 (*) 70 - 99 mg/dL   Comment 1 Notify RN    GLUCOSE, CAPILLARY     Status: Abnormal   Collection Time    09/26/13  7:11 AM      Result Value Ref Range   Glucose-Capillary 129 (*) 70 - 99 mg/dL   Comment 1 Notify RN    GLUCOSE, CAPILLARY     Status: Abnormal   Collection Time    09/26/13 11:56 AM      Result Value Ref Range   Glucose-Capillary 108 (*) 70 - 99 mg/dL   Comment 1 Notify RN    GLUCOSE, CAPILLARY     Status: Abnormal   Collection Time    09/26/13  4:33 PM      Result Value Ref Range   Glucose-Capillary 194 (*) 70 - 99 mg/dL   Comment 1 Notify RN    GLUCOSE, CAPILLARY     Status: Abnormal   Collection Time    09/26/13  9:20 PM      Result Value Ref Range   Glucose-Capillary 144 (*) 70 - 99 mg/dL   Comment 1 Notify RN    GLUCOSE, CAPILLARY     Status: Abnormal   Collection Time    09/27/13  7:07 AM      Result Value Ref Range   Glucose-Capillary 143 (*) 70 - 99 mg/dL   Comment 1 Notify RN        HENT: dentition poor  Head: Normocephalic.  Eyes: EOM are normal.  Neck: Normal range of motion. Neck supple. No thyromegaly present.  Cardiovascular: Normal rate and regular rhythm. No murmurs  Respiratory: Effort normal and breath  sounds normal. No respiratory distress.  GI: Soft. Bowel sounds are normal. He exhibits no distension.  Musculoskeletal: Mild tenderness Left flank/12th rib area  Shoulder , elbow, wrist And hand ROM normal  Neurological: He is alert and oriented to person, place, and time. A sensory deficit is present.  5/5 strength Bilateral delt, bi, tri, grip,  4/5 Bilateral HF and R KE  3- R ankle DF/PF  LLE hip anti gravity R Foot unchanged necrotic 1st, 2nd, 5th toes no surrounding erythema. Left leg without drainage  Ext no calf swelling or tenderness  Assessment/Plan: 1. Functional deficits secondary to Left BKA for gangrene (diabetic neuropathy, undiagnosed DM, no medical care) which require 3+ hours per day of interdisciplinary therapy in a comprehensive inpatient rehab setting. Physiatrist is providing close team supervision and 24 hour management of active medical problems listed below. Physiatrist and rehab team continue to assess barriers to discharge/monitor patient progress toward functional and medical goals.  FIM: FIM - Bathing Bathing Steps Patient Completed: Chest;Right Arm;Left Arm;Abdomen;Front perineal area;Buttocks;Right upper leg;Left upper leg Bathing: 4: Min-Patient completes 8-9 16f 10 parts or 75+ percent  FIM - Upper Body Dressing/Undressing Upper body dressing/undressing: 0: Wears gown/pajamas-no public clothing FIM - Lower Body Dressing/Undressing Lower body dressing/undressing steps patient completed: Thread/unthread right pants leg;Thread/unthread left pants leg;Pull pants up/down;Don/Doff right shoe;Fasten/unfasten right shoe Lower body dressing/undressing: 5: Supervision: Safety issues/verbal cues  FIM - Toileting Toileting steps completed by patient: Adjust clothing prior to toileting;Performs perineal hygiene;Adjust clothing after toileting Toileting Assistive Devices: Grab bar or rail for support Toileting: 5: Supervision: Safety issues/verbal cues  FIM -  Radio producer Devices: Grab bars Toilet Transfers: 5-From toilet/BSC: Supervision (verbal cues/safety issues);5-To toilet/BSC: Supervision (verbal cues/safety issues)  FIM - Control and instrumentation engineer Devices: Arm rests Bed/Chair Transfer: 5: Bed > Chair or W/C: Supervision (verbal cues/safety issues);5: Chair or W/C > Bed: Supervision (verbal cues/safety issues)  FIM - Locomotion: Wheelchair Distance: 150 Locomotion: Wheelchair: 6: Travels 150 ft or more, turns around,  maneuvers to table, bed or toilet, negotiates 3% grade: maneuvers on rugs and over door sills independently FIM - Locomotion: Ambulation Locomotion: Ambulation Assistive Devices: Walker - Rolling Ambulation/Gait Assistance: 5: Supervision Locomotion: Ambulation: 2: Travels 50 - 149 ft with supervision/safety issues  Comprehension Comprehension Mode: Auditory Comprehension: 6-Follows complex conversation/direction: With extra time/assistive device  Expression Expression Mode: Verbal Expression: 6-Expresses complex ideas: With extra time/assistive device  Social Interaction Social Interaction: 6-Interacts appropriately with others with medication or extra time (anti-anxiety, antidepressant).  Problem Solving Problem Solving: 6-Solves complex problems: With extra time  Memory Memory: 6-More than reasonable amt of time  Medical Problem List and Plan:  Left BKA, and R necrotic toes  1. DVT Prophylaxis/Anticoagulation:   SQ lovenox.  2. Pain Management: Prn oxcycodone.  3. Mood: Does not express any signs of distress or concerns. LCSW to follow for evaluation.  4. Neuropsych: This patient is capable of making decisions on his own behalf.  5. DM type 2 poorly controlled: Monitor BS with ac/hs checks.   lantus changed to 70/30 insulin 15u bid with good results.  Use SSI for elevated BS and titrate as indicated.    6. Recurrent fever: resolved off abx, low grade  yesterday  7. Anemia , guaic - x 1 neg, has had no medical care until this hospitalization, GI doubts bleed suspectinjg chronic disease , f/u H and H pending 8. Vomiting, resolved may be related to tramadol    LOS (Days) [place] EVALUATION WAS PERFORMED  [name] E 09/27/2013, 10:17 AM

## 2013-09-27 NOTE — Progress Notes (Signed)
Occupational Therapy Weekly Progress Note  Patient Details  Name: [name] MRN: [id number] Date of Birth: November 15, 1955  Today's Date: 09/27/2013 Time: 9753-0051 Time Calculation (min): 40 min  Patient has met 2 of 7 long term goals.  Short term goals not set due to estimated length of stay.  Secondary to pt not having a safe D/C destination pt awaiting NHP and all home goals D/C. Pt is mod I for bed mobility and w/c mobility but continues to require supervision for toilet transfers and toileting with RW secondary to pt requiring intermittent verbal cues for set up and safety. Pt with no shirts therefore unable to complete UB dressing, sister supposed to bring in clothes this weekend.  Continues to require min assist when donning sock.  Pt is demonstrating improved activity tolerance and cardiorespiratory endurance s/p transfusion.   Patient continues to demonstrate the following deficits: bilat UE and LE and core weakness, impaired activity tolerance, balance and therefore will continue to benefit from skilled OT intervention to enhance overall performance with BADL and Reduce care partner burden.  See Patient's Care Plan for progression toward long term goals.  Patient progressing toward long term goals..  Continue plan of care.  Skilled Therapeutic Interventions/Progress Updates:    Pt received resting in w/c.  Reports not having a good nights sleep last night due to "sleeping funny".  Engaged in functional mobility with w/c with focus on parts management and locking brakes prior to standing or transferring.  Pt required initial cue to lock brakes when attempting to transfer after adjusting leg rests and not locking brakes.  Engaged in [place] exercises with orange theraband in sitting on compliant surface with RLE elevated to further challenge trunk control and balance. Pt completed 2 reps of 15 each arm with shoulder flexion, horizontal abduction, and elbow flexion. Utilized 3# weighted dowel rod  with chest presses and rowing motion in unsupported sitting.  Therapy Documentation Precautions:  Precautions Precautions: Fall Precaution Comments:   Required Braces or Orthoses: Other Brace/Splint Other Brace/Splint: R DARCO shoe when OOB Restrictions Weight Bearing Restrictions: Yes LLE Weight Bearing: Non weight bearing Other Position/Activity Restrictions: keep L knee straight/elevated when in w/c, bed Pain: Pain Assessment Pain Assessment: 0-10 Pain Score: 5  Pain Type: Acute pain Pain Location: Flank Pain Orientation: Left Pain Descriptors / Indicators: Sharp Pain Onset: Gradual (onset this am) Pain Intervention(s): Other (Comment) (exercise/stretching)  See FIM for current functional status  Therapy/Group: Individual Therapy  [name] 09/27/2013, 2:34 PM

## 2013-09-28 ENCOUNTER — Inpatient Hospital Stay (HOSPITAL_COMMUNITY): Payer: Self-pay

## 2013-09-28 LAB — GLUCOSE, CAPILLARY
Glucose-Capillary: 150 mg/dL — ABNORMAL HIGH (ref 70–99)
Glucose-Capillary: 110 mg/dL — ABNORMAL HIGH (ref 70–99)
Glucose-Capillary: 166 mg/dL — ABNORMAL HIGH (ref 70–99)
Glucose-Capillary: 81 mg/dL (ref 70–99)

## 2013-09-28 NOTE — Progress Notes (Signed)
  Subjective: No new complaints. No new problems. Slept well. Feeling OK.  Objective: Vital signs in last 24 hours: Temp:  [98.3 F (36.8 C)-98.6 F (37 C)] 98.6 F (37 C) (02/28 0630) Pulse Rate:  [96-112] 112 (02/28 0632) Resp:  [16-18] 16 (02/28 0630) BP: (111-136)/(73-83) 112/73 mmHg (02/28 0632) SpO2:  [91 %-94 %] 91 % (02/28 0630) Weight change:  Last BM Date: 09/27/13  Intake/Output from previous day: 02/27 0701 - 02/28 0700 In: 1320 [P.O.:1320] Out: 950 [Urine:950] Last cbgs: CBG (last 3)   Recent Labs  09/27/13 1140 09/27/13 1633 09/27/13 2056  GLUCAP 99 128* 183*     Physical Exam General: No apparent distress   HEENT: not dry Lungs: Normal effort. Lungs clear to auscultation, no crackles or wheezes. Cardiovascular: Regular rate and rhythm, no edema Abdomen: S/NT/ND; BS(+) Musculoskeletal:  unchanged Neurological: No new neurological deficits Wounds: L BKA - new; R BKA  Skin: clear  Aging changes Mental state: Alert, oriented, cooperative    Lab Results: BMET    Component Value Date/Time   NA 142 09/23/2013 0425   K 3.9 09/23/2013 0425   CL 103 09/23/2013 0425   CO2 25 09/23/2013 0425   GLUCOSE 146* 09/23/2013 0425   BUN 18 09/23/2013 0425   CREATININE 1.16 09/23/2013 0425   CALCIUM 8.8 09/23/2013 0425   GFRNONAA 69* 09/23/2013 0425   GFRAA 80* 09/23/2013 0425   CBC    Component Value Date/Time   WBC 8.1 09/23/2013 0425   RBC 2.58* 09/23/2013 0425   RBC 2.64* 09/20/2013 0942   HGB 10.2* 09/25/2013 0610   HCT 32.2* 09/25/2013 0610   PLT 384 09/23/2013 0425   MCV 89.5 09/23/2013 0425   MCH 28.7 09/23/2013 0425   MCHC 32.0 09/23/2013 0425   RDW 14.4 09/23/2013 0425   LYMPHSABS 0.8 09/19/2013 1122   MONOABS 0.4 09/19/2013 1122   EOSABS 0.0 09/19/2013 1122   BASOSABS 0.0 09/19/2013 1122    Studies/Results: No results found.  Medications: I have reviewed the patient's current medications.  Assessment/Plan:  Left BKA, and R necrotic toes  1. DVT  Prophylaxis/Anticoagulation: SQ lovenox.  2. Pain Management: Prn oxcycodone.  3. Mood: Does not express any signs of distress or concerns. LCSW to follow for evaluation.  4. Neuropsych: This patient is capable of making decisions on his own behalf.  5. DM type 2 poorly controlled: Monitor BS with ac/hs checks. lantus changed to 70/30 insulin 15u bid with good results. Use SSI for elevated BS and titrate as indicated.  6. Recurrent fever: resolved off abx, low grade yesterday  7. Anemia , guaic - x 1 neg, has had no medical care until this hospitalization, GI doubts bleed suspectinjg chronic disease , f/u H and H pending  8. Vomiting, resolved may be related to tramadol  Cont Rx     Length of stay, days: [place] , MD 09/28/2013, 11:03 AM

## 2013-09-28 NOTE — Progress Notes (Signed)
Physical Therapy Session Note  Patient Details  Name: [name] MRN: [id number] Date of Birth: [birth year]  Today's Date: 09/28/2013 Time: 1450-1535 Time Calculation (min): 45 min   Skilled Therapeutic Interventions/Progress Updates:    Pt received supine in bed and agreeable to therapy. Pt mod I with bed mobility, supine to sit and squat pivot transfer from bed to w/c. Pt requires increased time with w/c parts mangement and cues with L residual limb pad. Pt propelled wheel chair 250 feet in hallway at mod I level for endurance, propelling to laundry room to retrieve his sweat pants per pt request. Pt ambulated with RW 150 feet x1 with min A/min guard with cues for pacing and to decrease stride length of the RLE. Pt completed there ex in sitting (3# RLE), including LAQ, marching, hip abd and hamstring curls with BLE. PT completed there ex in standing with the LLE, including hamstring curls, hip abd, hip flexion and hip ext, 1x15 each. Pt propelled wheel chair back to room and completed squat pivot tsf back to bed. Pt able to line wheel chair up for tsf with min cues for safety as pt lined wheel chair up to far away from bed, demonstrating decreased safety awareness. Pt sitting EOB with all needs in reach.   Therapy Documentation Precautions:  Precautions Precautions: Fall Precaution Comments:   Required Braces or Orthoses: Other Brace/Splint Other Brace/Splint: R DARCO shoe when OOB Restrictions Weight Bearing Restrictions: Yes LLE Weight Bearing: Non weight bearing Other Position/Activity Restrictions: keep L knee straight/elevated when in w/c, bed    Vital Signs: Therapy Vitals Temp: 97.7 F (36.5 C) Temp src: Oral Pulse Rate: 88 Resp: 16 BP: 116/80 mmHg Patient Position, if appropriate: Sitting Oxygen Therapy SpO2: 98 % O2 Device: None (Room air) Pain: Pain Assessment Pain Assessment: 0-10 Pain Score: 0-No pain Pain Type: Phantom pain   See FIM for current functional  status  Therapy/Group: Individual Therapy  [name] R 09/28/2013, 4:24 PM

## 2013-09-29 ENCOUNTER — Inpatient Hospital Stay (HOSPITAL_COMMUNITY): Payer: Medicaid Other

## 2013-09-29 LAB — GLUCOSE, CAPILLARY
GLUCOSE-CAPILLARY: 117 mg/dL — AB (ref 70–99)
GLUCOSE-CAPILLARY: 160 mg/dL — AB (ref 70–99)
Glucose-Capillary: 141 mg/dL — ABNORMAL HIGH (ref 70–99)
Glucose-Capillary: 160 mg/dL — ABNORMAL HIGH (ref 70–99)

## 2013-09-29 NOTE — Progress Notes (Signed)
  Subjective: No new complaints.  Slept well. Feeling OK.  Objective: Vital signs in last 24 hours: Temp:  [97.7 F (36.5 C)-98.8 F (37.1 C)] 98.8 F (37.1 C) (03/01 0604) Pulse Rate:  [88-96] 96 (03/01 0605) Resp:  [16-18] 17 (03/01 0605) BP: (100-116)/(69-80) 100/69 mmHg (03/01 0605) SpO2:  [94 %-98 %] 94 % (03/01 0605) Weight change:  Last BM Date: 09/28/13  Intake/Output from previous day: 02/28 0701 - 03/01 0700 In: 1080 [P.O.:1080] Out: 850 [Urine:850] Last cbgs: CBG (last 3)   Recent Labs  09/28/13 2019 09/29/13 0708 09/29/13 1132  GLUCAP 81 141* 117*     Physical Exam General: No apparent distress   HEENT: not dry Lungs: Normal effort. Lungs clear to auscultation, no crackles or wheezes. Cardiovascular: Regular rate and rhythm, no edema Abdomen: S/NT/ND; BS(+) Musculoskeletal:  unchanged Neurological: No new neurological deficits Wounds: L BKA - new; R BKA  Skin: clear  Aging changes Mental state: Alert, oriented, cooperative    Lab Results: BMET    Component Value Date/Time   NA 142 09/23/2013 0425   K 3.9 09/23/2013 0425   CL 103 09/23/2013 0425   CO2 25 09/23/2013 0425   GLUCOSE 146* 09/23/2013 0425   BUN 18 09/23/2013 0425   CREATININE 1.16 09/23/2013 0425   CALCIUM 8.8 09/23/2013 0425   GFRNONAA 69* 09/23/2013 0425   GFRAA 80* 09/23/2013 0425   CBC    Component Value Date/Time   WBC 8.1 09/23/2013 0425   RBC 2.58* 09/23/2013 0425   RBC 2.64* 09/20/2013 0942   HGB 10.2* 09/25/2013 0610   HCT 32.2* 09/25/2013 0610   PLT 384 09/23/2013 0425   MCV 89.5 09/23/2013 0425   MCH 28.7 09/23/2013 0425   MCHC 32.0 09/23/2013 0425   RDW 14.4 09/23/2013 0425   LYMPHSABS 0.8 09/19/2013 1122   MONOABS 0.4 09/19/2013 1122   EOSABS 0.0 09/19/2013 1122   BASOSABS 0.0 09/19/2013 1122    Studies/Results: No results found.  Medications: I have reviewed the patient's current medications.  Assessment/Plan:  Left BKA, and R necrotic toes   1. DVT  Prophylaxis/Anticoagulation: SQ lovenox.  2. Pain Management: Prn oxcycodone.  3. Mood: Does not express any signs of distress or concerns. LCSW to follow for evaluation.  4. Neuropsych: This patient is capable of making decisions on his own behalf.  5. DM type 2 poorly controlled: Monitor BS with ac/hs checks. lantus changed to 70/30 insulin 15u bid with good results. Use SSI for elevated BS and titrate as indicated.  6. Recurrent fever: resolved off abx, low grade yesterday  7. Anemia , guaic - x 1 neg, has had no medical care until this hospitalization, GI doubts bleed suspectinjg chronic disease , f/u H and H pending  8. Vomiting, resolved may be related to tramadol  Cont Rx     Length of stay, days: [place] , MD 09/29/2013, 1:25 PM

## 2013-09-29 NOTE — Progress Notes (Signed)
Physical Therapy Note  Patient Details  Name: [name] MRN: [id number] Date of Birth: Mar 18, 1956 Today's Date: 09/29/2013  10:00 - 10:40 40 minutes  Individual session Patient denies pain.  Patient resting in bed upon entering room. Patient independent with supine to sit. Patient performed stand pivot transfer to wheelchair with supervision. Patient propelled wheelchair 150 feet to gym with bilateral UE's. Patient performed LE strengthening exercises including: left knee flesion, hip flexion, hip extension, and hip abduction for 2 set of 15 reps in standing with RW.  Patient used 3# weight on right LE for sitting LAQ's 2 sets of 15 reps and orange theraband to resist knee flexion and bilateral hip abduction in sitting. Patient sit to stand and ambulated 150 feet with RW and darco shoe with close supervision. Patient required 2 standing rest breaks during walk. Patient left sitting at edge of bed with all items in reach.   [name] 09/29/2013, 11:50 AM

## 2013-09-30 ENCOUNTER — Inpatient Hospital Stay (HOSPITAL_COMMUNITY): Payer: Medicaid Other | Admitting: Physical Therapy

## 2013-09-30 ENCOUNTER — Encounter (HOSPITAL_COMMUNITY): Payer: Self-pay

## 2013-09-30 ENCOUNTER — Inpatient Hospital Stay (HOSPITAL_COMMUNITY): Payer: Medicaid Other | Admitting: Occupational Therapy

## 2013-09-30 DIAGNOSIS — I739 Peripheral vascular disease, unspecified: Secondary | ICD-10-CM

## 2013-09-30 LAB — GLUCOSE, CAPILLARY
GLUCOSE-CAPILLARY: 152 mg/dL — AB (ref 70–99)
Glucose-Capillary: 146 mg/dL — ABNORMAL HIGH (ref 70–99)
Glucose-Capillary: 141 mg/dL — ABNORMAL HIGH (ref 70–99)
Glucose-Capillary: 76 mg/dL (ref 70–99)

## 2013-09-30 NOTE — Progress Notes (Signed)
Physical Therapy Session Note  Patient Details  Name: [name] MRN: [id number] Date of Birth: April 08, 1956  Today's Date: 09/30/2013 Time: 0907-1002 Time Calculation (min): 55 min  Short Term Goals: No short term goals set  Skilled Therapeutic Interventions/Progress Updates:   Pt performed transfers supine <> sit, bed <> w/c <> Nustep <> mat with squat pivot to L and R mod I and stand pivot with RW min guard/supervision.  Pt performed w/c mobility and parts management to set up for transfers mod I.   TE: Performed bilat UE and RLE strengthening and endurance training on Nustep at level 6 resistance x 10 minutes at RPE 13.  Provided pt with handouts of BKA HEP.  Each exercise pt has performed during previous sessions except supine bilat LE bridges.  Supine on mat with L limb on folded pillow performed 2 sets x 10 reps bilat LE bridges for closed chain hip extension strengthening.  Will review full HEP tomorrow.  Gait: Performed stair negotiation training; pt did not feel strong enough or comfortable performing stair negotiation forwards with bilat rails; changed to retro and pt negotiated 3 stairs (4") backwards to ascend and forwards to descend with bilat rails and min A.  Performed gait with RW x 125' in controlled environment and min guard making R and L turns with one standing rest break but pt demonstrating good foot clearance and improved safety/management of RW.  Therapy Documentation Precautions:  Precautions Precautions: Fall Precaution Comments:   Required Braces or Orthoses: Other Brace/Splint Other Brace/Splint: R DARCO shoe when OOB Restrictions Weight Bearing Restrictions: Yes LLE Weight Bearing: Non weight bearing Other Position/Activity Restrictions: keep L knee straight/elevated when in w/c, bed Pain: Pain Assessment Pain Assessment: No/denies pain Locomotion : Ambulation Ambulation/Gait Assistance: 5: Supervision;4: Min guard Wheelchair Mobility Distance: 150    See FIM for current functional status  Therapy/Group: Individual Therapy  Raylene Everts [facility] 09/30/2013, 12:13 PM

## 2013-09-30 NOTE — Progress Notes (Signed)
Necrotic toes stable - will allow auto amputation Plan to look at L BKA incision on Wednesday and dc sutures - please do not dc sutures until seen by ortho first.  Azucena Cecil, MD [place] 8:05 AM

## 2013-09-30 NOTE — Progress Notes (Addendum)
Occupational Therapy Session Note  Patient Details  Name: [name] MRN: [id number] Date of Birth: 08-12-1955  Today's Date: 09/30/2013 Time: 1030-1115 Time Calculation (min): 45 min  Short Term Goals: No short term goals set  Skilled Therapeutic Interventions/Progress Updates:    Pt seen for therapeutic exercise with focus on BUE strengthening and endurance necessary for transfers, self-care tasks, and ambulation.  Pt performed bed > w/c distant supervision and propelled w/c to therapy gym.  Engaged in [place] chest presses, tricep curls, and rowing with 5# dowel rod in unsupported sitting on therapy mat without foot support.  Pt completed 2 reps of 15 each exercise.  Also completed PNF pattern reaching with 4# medicine ball in sitting with focus on BUE strengthening and trunk rotation.  Pt tolerated each activity with rest breaks between each set, but overall increased activity tolerance.  Pt returned to room via w/c and setup w/c to perform transfer back to bed with improved positioning requiring no additional cues this session.  Discussed residual limb wrapping and located detailed instructions with pictures in [employer] magazine provided to him in his rehab packet.  Pt reports he may receive a shrinker and would like to wait till sutures removed Weds to move forward on limb wrapping.  Therapy Documentation Precautions:  Precautions Precautions: Fall Precaution Comments:   Required Braces or Orthoses: Other Brace/Splint Other Brace/Splint: R DARCO shoe when OOB Restrictions Weight Bearing Restrictions: Yes LLE Weight Bearing: Non weight bearing Other Position/Activity Restrictions: keep L knee straight/elevated when in w/c, bed Pain:  Pt with no c/o pain  See FIM for current functional status  Therapy/Group: Individual Therapy  [name] 09/30/2013, 11:48 AM

## 2013-09-30 NOTE — Progress Notes (Signed)
Subjective/Complaints: Appreciate orthopedic note  No lower extremity pain no phantom sensation   Review of Systems - no pains, no nausea or vomiting  Objective: Vital Signs: Blood pressure 138/86, pulse 94, temperature 98.7 F (37.1 C), temperature source Oral, resp. rate 18, height 5\' 3"  (1.6 m), weight 66.5 kg (146 lb 9.7 oz), SpO2 96.00%. No results found. Results for orders placed during the hospital encounter of 09/13/13 (from the past 72 hour(s))  GLUCOSE, CAPILLARY     Status: Abnormal   Collection Time    09/27/13  4:33 PM      Result Value Ref Range   Glucose-Capillary 128 (*) 70 - 99 mg/dL   Comment 1 Notify RN    GLUCOSE, CAPILLARY     Status: Abnormal   Collection Time    09/27/13  8:56 PM      Result Value Ref Range   Glucose-Capillary 183 (*) 70 - 99 mg/dL   Comment 1 Notify RN    GLUCOSE, CAPILLARY     Status: Abnormal   Collection Time    09/28/13  7:13 AM      Result Value Ref Range   Glucose-Capillary 150 (*) 70 - 99 mg/dL   Comment 1 Notify RN    GLUCOSE, CAPILLARY     Status: Abnormal   Collection Time    09/28/13 11:26 AM      Result Value Ref Range   Glucose-Capillary 110 (*) 70 - 99 mg/dL   Comment 1 Notify RN    GLUCOSE, CAPILLARY     Status: Abnormal   Collection Time    09/28/13  4:27 PM      Result Value Ref Range   Glucose-Capillary 166 (*) 70 - 99 mg/dL   Comment 1 Notify RN    GLUCOSE, CAPILLARY     Status: None   Collection Time    09/28/13  8:19 PM      Result Value Ref Range   Glucose-Capillary 81  70 - 99 mg/dL   Comment 1 Notify RN    GLUCOSE, CAPILLARY     Status: Abnormal   Collection Time    09/29/13  7:08 AM      Result Value Ref Range   Glucose-Capillary 141 (*) 70 - 99 mg/dL   Comment 1 Notify RN    GLUCOSE, CAPILLARY     Status: Abnormal   Collection Time    09/29/13 11:32 AM      Result Value Ref Range   Glucose-Capillary 117 (*) 70 - 99 mg/dL   Comment 1 Notify RN    GLUCOSE, CAPILLARY     Status: Abnormal    Collection Time    09/29/13  4:31 PM      Result Value Ref Range   Glucose-Capillary 160 (*) 70 - 99 mg/dL   Comment 1 Notify RN    GLUCOSE, CAPILLARY     Status: Abnormal   Collection Time    09/29/13  8:39 PM      Result Value Ref Range   Glucose-Capillary 160 (*) 70 - 99 mg/dL  GLUCOSE, CAPILLARY     Status: Abnormal   Collection Time    09/30/13  7:26 AM      Result Value Ref Range   Glucose-Capillary 146 (*) 70 - 99 mg/dL   Comment 1 Notify RN    GLUCOSE, CAPILLARY     Status: Abnormal   Collection Time    09/30/13 11:19 AM      Result Value Ref  Range   Glucose-Capillary 141 (*) 70 - 99 mg/dL   Comment 1 Notify RN        HENT: dentition poor  Head: Normocephalic.  Eyes: EOM are normal.  Neck: Normal range of motion. Neck supple. No thyromegaly present.  Cardiovascular: Normal rate and regular rhythm. No murmurs  Respiratory: Effort normal and breath sounds normal. No respiratory distress.  GI: Soft. Bowel sounds are normal. He exhibits no distension.  Musculoskeletal: Mild tenderness Left flank/12th rib area  Shoulder , elbow, wrist And hand ROM normal  Neurological: He is alert and oriented to person, place, and time. A sensory deficit is present.  5/5 strength Bilateral delt, bi, tri, grip,  4/5 Bilateral HF and R KE  3- R ankle DF/PF  LLE hip anti gravity R Foot unchanged necrotic 1st, 2nd, 5th toes no surrounding erythema. Left leg without drainage  Ext no calf swelling or tenderness  Assessment/Plan: 1. Functional deficits secondary to Left BKA for gangrene (diabetic neuropathy, undiagnosed DM, no medical care) which require 3+ hours per day of interdisciplinary therapy in a comprehensive inpatient rehab setting. Physiatrist is providing close team supervision and 24 hour management of active medical problems listed below. Physiatrist and rehab team continue to assess barriers to discharge/monitor patient progress toward functional and medical  goals.  FIM: FIM - Bathing Bathing Steps Patient Completed: Chest;Right Arm;Left Arm;Abdomen;Front perineal area;Buttocks;Right upper leg;Left upper leg Bathing: 4: Min-Patient completes 8-9 60f 10 parts or 75+ percent  FIM - Upper Body Dressing/Undressing Upper body dressing/undressing: 0: Wears gown/pajamas-no public clothing FIM - Lower Body Dressing/Undressing Lower body dressing/undressing steps patient completed: Thread/unthread right pants leg;Thread/unthread left pants leg;Pull pants up/down;Don/Doff right shoe;Fasten/unfasten right shoe Lower body dressing/undressing: 5: Supervision: Safety issues/verbal cues  FIM - Toileting Toileting steps completed by patient: Adjust clothing prior to toileting;Performs perineal hygiene;Adjust clothing after toileting Toileting Assistive Devices: Grab bar or rail for support Toileting: 7: Independent: No helper, no device  FIM - Radio producer Devices: Grab bars Toilet Transfers: 5-From toilet/BSC: Supervision (verbal cues/safety issues);5-To toilet/BSC: Supervision (verbal cues/safety issues)  FIM - Engineer, site Assistive Devices: Arm rests;Walker Bed/Chair Transfer: 6: Supine > Sit: No assist;6: Sit > Supine: No assist;6: Bed > Chair or W/C: No assist;6: Chair or W/C > Bed: No assist  FIM - Locomotion: Wheelchair Distance: 150 Locomotion: Wheelchair: 6: Travels 150 ft or more, turns around, maneuvers to table, bed or toilet, negotiates 3% grade: maneuvers on rugs and over door sills independently FIM - Locomotion: Ambulation Locomotion: Ambulation Assistive Devices: Administrator Ambulation/Gait Assistance: 5: Supervision;4: Min guard Locomotion: Ambulation: 5: Travels 150 ft or more with supervision/safety issues  Comprehension Comprehension Mode: Auditory Comprehension: 6-Follows complex conversation/direction: With extra time/assistive device  Expression Expression Mode:  Verbal Expression: 7-Expresses complex ideas: With no assist  Social Interaction Social Interaction: 7-Interacts appropriately with others - No medications needed.  Problem Solving Problem Solving: 5-Solves complex 90% of the time/cues < 10% of the time  Memory Memory: 5-Requires cues to use assistive device  Medical Problem List and Plan:  Left BKA, and R necrotic toes  1. DVT Prophylaxis/Anticoagulation:   SQ lovenox.  2. Pain Management: Prn oxcycodone.  3. Mood: Does not express any signs of distress or concerns. LCSW to follow for evaluation.  4. Neuropsych: This patient is capable of making decisions on his own behalf.  5. DM type 2 controlled: Cont current regimen, titrate as indicated.    6. Anemia , guaic -  x 1 neg, has had no medical care until this hospitalization, GI doubts bleed suspectinjg chronic disease      LOS (Days) 17 A FACE TO FACE EVALUATION WAS PERFORMED  Akram Kissick E 09/30/2013, 1:47 PM

## 2013-09-30 NOTE — Plan of Care (Signed)
Problem: RH PAIN MANAGEMENT Goal: RH STG PAIN MANAGED AT OR BELOW PT'S PAIN GOAL <3 on a 0-10 scale  Outcome: Progressing No c/o pain

## 2013-10-01 ENCOUNTER — Inpatient Hospital Stay (HOSPITAL_COMMUNITY): Payer: Medicaid Other | Admitting: Physical Therapy

## 2013-10-01 ENCOUNTER — Inpatient Hospital Stay (HOSPITAL_COMMUNITY): Payer: Medicaid Other | Admitting: Occupational Therapy

## 2013-10-01 DIAGNOSIS — L03119 Cellulitis of unspecified part of limb: Secondary | ICD-10-CM

## 2013-10-01 DIAGNOSIS — L98499 Non-pressure chronic ulcer of skin of other sites with unspecified severity: Secondary | ICD-10-CM

## 2013-10-01 DIAGNOSIS — I739 Peripheral vascular disease, unspecified: Secondary | ICD-10-CM

## 2013-10-01 DIAGNOSIS — L02619 Cutaneous abscess of unspecified foot: Secondary | ICD-10-CM

## 2013-10-01 DIAGNOSIS — M726 Necrotizing fasciitis: Secondary | ICD-10-CM

## 2013-10-01 DIAGNOSIS — E1310 Other specified diabetes mellitus with ketoacidosis without coma: Secondary | ICD-10-CM

## 2013-10-01 DIAGNOSIS — S88119A Complete traumatic amputation at level between knee and ankle, unspecified lower leg, initial encounter: Secondary | ICD-10-CM

## 2013-10-01 LAB — GLUCOSE, CAPILLARY
GLUCOSE-CAPILLARY: 139 mg/dL — AB (ref 70–99)
GLUCOSE-CAPILLARY: 125 mg/dL — AB (ref 70–99)
Glucose-Capillary: 145 mg/dL — ABNORMAL HIGH (ref 70–99)
Glucose-Capillary: 133 mg/dL — ABNORMAL HIGH (ref 70–99)

## 2013-10-01 LAB — HEMOGLOBIN AND HEMATOCRIT, BLOOD
HEMATOCRIT: 35.5 % — AB (ref 39.0–52.0)
HEMOGLOBIN: 11.7 g/dL — AB (ref 13.0–17.0)

## 2013-10-01 MED ORDER — METOPROLOL TARTRATE 12.5 MG HALF TABLET
12.5000 mg | ORAL_TABLET | Freq: Two times a day (BID) | ORAL | Status: DC
  Administered 2013-10-01 – 2013-10-13 (×26): 12.5 mg via ORAL
  Filled 2013-10-01 (×30): qty 1

## 2013-10-01 NOTE — Progress Notes (Signed)
H/H with improvement past transfusion. Will add low dose lopressor bid for tachycardia.

## 2013-10-01 NOTE — Progress Notes (Signed)
Physical Therapy Session Note  Patient Details  Name: [name] MRN: [id number] Date of Birth: 1956-04-14  Today's Date: 10/01/2013 Time: 0901-0956 Time Calculation (min): 55 min  Short Term Goals: No short term goals set  Skilled Therapeutic Interventions/Progress Updates:   Pt reporting that R great toe is beginning to split.  Pt transferred supine > sit and bed <> w/c <> mat squat pivot mod I and w/c set up and parts management mod I.  On mat reviewed LE HEP handout with pt return demonstrating each exercise x 12 reps for LAQ, inner thigh squeezes in sitting, supine glute and quad sets, bilat bridges, sidelying hip ABD, prone hamstring curls and hip extension.  Returned to sitting and performed 2 sets x 12 reps tricep dips on push up blocks with foot off the floor and 12 reps resisted R and L PNF D2 flexion <> extension with trunk rotation.  Returned to room and to EOB mod I.     Therapy Documentation Precautions:  Precautions Precautions: Fall Precaution Comments:   Required Braces or Orthoses: Other Brace/Splint Other Brace/Splint: R DARCO shoe when OOB Restrictions Weight Bearing Restrictions: Yes LLE Weight Bearing: Non weight bearing Other Position/Activity Restrictions: keep L knee straight/elevated when in w/c, bed Pain: Pain Assessment Pain Assessment: No/denies pain Pain Score: 0-No pain Locomotion : Wheelchair Mobility Distance: 150   See FIM for current functional status  Therapy/Group: Individual Therapy  Raylene Everts [facility] 10/01/2013, 9:58 AM

## 2013-10-01 NOTE — Progress Notes (Signed)
Social Work Patient ID: [name], male   DOB: [birth year], 58 y.o.   MRN: [id number] Have expanded my bed search, pt is aware of this.  Pt feels he is doing better.  He has a disability appointment Thursday at 2;00pm.

## 2013-10-01 NOTE — Progress Notes (Signed)
Subjective/Complaints: Appreciate orthopedic note  No lower extremity pain no phantom sensation   Review of Systems - no pains, no nausea or vomiting  Objective: Vital Signs: Blood pressure 115/81, pulse 119, temperature 98.6 F (37 C), temperature source Oral, resp. rate 21, height 5\' 3"  (1.6 m), weight 66.5 kg (146 lb 9.7 oz), SpO2 97.00%. No results found. Results for orders placed during the hospital encounter of 09/13/13 (from the past 72 hour(s))  GLUCOSE, CAPILLARY     Status: Abnormal   Collection Time    09/28/13  7:13 AM      Result Value Ref Range   Glucose-Capillary 150 (*) 70 - 99 mg/dL   Comment 1 Notify RN    GLUCOSE, CAPILLARY     Status: Abnormal   Collection Time    09/28/13 11:26 AM      Result Value Ref Range   Glucose-Capillary 110 (*) 70 - 99 mg/dL   Comment 1 Notify RN    GLUCOSE, CAPILLARY     Status: Abnormal   Collection Time    09/28/13  4:27 PM      Result Value Ref Range   Glucose-Capillary 166 (*) 70 - 99 mg/dL   Comment 1 Notify RN    GLUCOSE, CAPILLARY     Status: None   Collection Time    09/28/13  8:19 PM      Result Value Ref Range   Glucose-Capillary 81  70 - 99 mg/dL   Comment 1 Notify RN    GLUCOSE, CAPILLARY     Status: Abnormal   Collection Time    09/29/13  7:08 AM      Result Value Ref Range   Glucose-Capillary 141 (*) 70 - 99 mg/dL   Comment 1 Notify RN    GLUCOSE, CAPILLARY     Status: Abnormal   Collection Time    09/29/13 11:32 AM      Result Value Ref Range   Glucose-Capillary 117 (*) 70 - 99 mg/dL   Comment 1 Notify RN    GLUCOSE, CAPILLARY     Status: Abnormal   Collection Time    09/29/13  4:31 PM      Result Value Ref Range   Glucose-Capillary 160 (*) 70 - 99 mg/dL   Comment 1 Notify RN    GLUCOSE, CAPILLARY     Status: Abnormal   Collection Time    09/29/13  8:39 PM      Result Value Ref Range   Glucose-Capillary 160 (*) 70 - 99 mg/dL  GLUCOSE, CAPILLARY     Status: Abnormal   Collection Time    09/30/13   7:26 AM      Result Value Ref Range   Glucose-Capillary 146 (*) 70 - 99 mg/dL   Comment 1 Notify RN    GLUCOSE, CAPILLARY     Status: Abnormal   Collection Time    09/30/13 11:19 AM      Result Value Ref Range   Glucose-Capillary 141 (*) 70 - 99 mg/dL   Comment 1 Notify RN    GLUCOSE, CAPILLARY     Status: Abnormal   Collection Time    09/30/13  4:17 PM      Result Value Ref Range   Glucose-Capillary 152 (*) 70 - 99 mg/dL   Comment 1 Notify RN    GLUCOSE, CAPILLARY     Status: None   Collection Time    09/30/13  9:11 PM      Result Value Ref  Range   Glucose-Capillary 76  70 - 99 mg/dL      HENT: dentition poor  Head: Normocephalic.  Eyes: EOM are normal.  Neck: Normal range of motion. Neck supple. No thyromegaly present.  Cardiovascular: Normal rate and regular rhythm. No murmurs  Respiratory: Effort normal and breath sounds normal. No respiratory distress.  GI: Soft. Bowel sounds are normal. He exhibits no distension.  Musculoskeletal: Mild tenderness Left flank/12th rib area  Shoulder , elbow, wrist And hand ROM normal  Neurological: He is alert and oriented to person, place, and time. A sensory deficit is present.  5/5 strength Bilateral delt, bi, tri, grip,  4/5 Bilateral HF and R KE  3- R ankle DF/PF  LLE hip anti gravity R Foot unchanged necrotic 1st, 2nd, 5th toes no surrounding erythema. Left leg without drainage  Ext no calf swelling or tenderness  Assessment/Plan: 1. Functional deficits secondary to Left BKA for gangrene (diabetic neuropathy, undiagnosed DM, no medical care) which require 3+ hours per day of interdisciplinary therapy in a comprehensive inpatient rehab setting. Physiatrist is providing close team supervision and 24 hour management of active medical problems listed below. Physiatrist and rehab team continue to assess barriers to discharge/monitor patient progress toward functional and medical goals.  FIM: FIM - Bathing Bathing Steps Patient  Completed: Chest;Right Arm;Left Arm;Abdomen;Front perineal area;Buttocks;Right upper leg;Left upper leg Bathing: 4: Min-Patient completes 8-9 64f 10 parts or 75+ percent  FIM - Upper Body Dressing/Undressing Upper body dressing/undressing: 0: Wears gown/pajamas-no public clothing FIM - Lower Body Dressing/Undressing Lower body dressing/undressing steps patient completed: Thread/unthread right pants leg;Thread/unthread left pants leg;Pull pants up/down;Don/Doff right shoe;Fasten/unfasten right shoe Lower body dressing/undressing: 5: Supervision: Safety issues/verbal cues  FIM - Toileting Toileting steps completed by patient: Adjust clothing prior to toileting;Performs perineal hygiene;Adjust clothing after toileting Toileting Assistive Devices: Grab bar or rail for support Toileting: 7: Independent: No helper, no device  FIM - Radio producer Devices: Grab bars Toilet Transfers: 5-From toilet/BSC: Supervision (verbal cues/safety issues);5-To toilet/BSC: Supervision (verbal cues/safety issues)  FIM - Engineer, site Assistive Devices: Arm rests;Walker Bed/Chair Transfer: 6: Supine > Sit: No assist;6: Sit > Supine: No assist;6: Bed > Chair or W/C: No assist;6: Chair or W/C > Bed: No assist  FIM - Locomotion: Wheelchair Distance: 150 Locomotion: Wheelchair: 6: Travels 150 ft or more, turns around, maneuvers to table, bed or toilet, negotiates 3% grade: maneuvers on rugs and over door sills independently FIM - Locomotion: Ambulation Locomotion: Ambulation Assistive Devices: Administrator Ambulation/Gait Assistance: 5: Supervision;4: Min guard Locomotion: Ambulation: 5: Travels 150 ft or more with supervision/safety issues  Comprehension Comprehension Mode: Auditory Comprehension: 6-Follows complex conversation/direction: With extra time/assistive device  Expression Expression Mode: Verbal Expression: 7-Expresses complex ideas: With no  assist  Social Interaction Social Interaction: 7-Interacts appropriately with others - No medications needed.  Problem Solving Problem Solving: 6-Solves complex problems: With extra time  Memory Memory: 6-More than reasonable amt of time  Medical Problem List and Plan:  Left BKA, and R necrotic toes  1. DVT Prophylaxis/Anticoagulation:   SQ lovenox.  2. Pain Management: Prn oxcycodone.  3. Mood: Does not express any signs of distress or concerns. LCSW to follow for evaluation.  4. Neuropsych: This patient is capable of making decisions on his own behalf.  5. DM type 2 controlled: Cont current regimen, titrate as indicated.    6. Anemia , guaic - x 1 neg, has had no medical care until this hospitalization, GI doubts  bleed suspectinjg chronic disease this is likely contributing to sinus tach, recheck CBC, if Hgb>8 consider BB     LOS (Days) 18 A FACE TO FACE EVALUATION WAS PERFORMED  KIRSTEINS,ANDREW E 10/01/2013, 6:37 AM

## 2013-10-01 NOTE — Progress Notes (Signed)
NUTRITION FOLLOW UP  Intervention:   1.  General healthful diet; encourage intake of foods and beverages as able.  RD to follow and assess for nutritional adequacy.   Nutrition Dx:   Increased nutrient needs related to infected foot wound as evidenced by estimated nutrition needs.   Goal:  Intake to meet >90% of estimated nutrition needs to support wound healing; optimal glycemic control to support healing.   Monitor:  PO intake, labs, weight trend, education needs  Assessment:   Patient with history of hepatitis; presented to the hospital on 2/1 with foot swelling since last week. Patient became incontinent of urine, feeling lightheaded, blurred vision, patient states that he started to loose balance as well. X-ray on admission showed gas in the soft tissue. Underwent I&D with 4th and 5th amputation to L foot as well as placement of VAC. 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 Noted to refuse DM education by nursing staff during acute hospitalization.   Educated by RD staff on 2/4 during acute hospitalization - pt was given handouts and was noted to be feeling very overwhelmed with information. Confirmed with patient that he was eating well during acute hospitalization.  Pt continues to eat well at 100% of his meals. No immediate nutrition related concerns or needs.   Height: Ht Readings from Last 1 Encounters:  09/13/13 5\' 3"  (1.6 m)    Weight Status:   Wt Readings from Last 1 Encounters:  09/25/13 146 lb 9.7 oz (66.5 kg)    Re-estimated needs:  Kcal: 2000  Protein: 95 - 110 gm  Fluid: 2 liters daily  Skin: incision to stump site  Diet Order: Carb Control   Intake/Output Summary (Last 24 hours) at 10/01/13 1024 Last data filed at 10/01/13 0830  Gross per 24 hour  Intake    720 ml  Output    650 ml  Net     70 ml    Last BM: 3/3  Labs:  No results found for this basename: NA, K, CL, CO2, BUN, CREATININE, CALCIUM, MG, PHOS,  GLUCOSE,  in the last 168 hours  CBG (last 3)   Recent Labs  09/30/13 1617 09/30/13 2111 10/01/13 0716  GLUCAP 152* 76 145*    Scheduled Meds: . acetaminophen  650 mg Oral Once  . aspirin EC  325 mg Oral Daily  . enoxaparin (LOVENOX) injection  40 mg Subcutaneous Q24H  . ferrous sulfate  325 mg Oral BID WC  . insulin aspart  0-20 Units Subcutaneous TID WC  . insulin aspart  0-5 Units Subcutaneous QHS  . insulin aspart protamine- aspart  15 Units Subcutaneous BID WC  . metoprolol tartrate  12.5 mg Oral BID  . multivitamin with minerals  1 tablet Oral Daily  . protein supplement  1 scoop Oral TID WC    Continuous Infusions:   Brynda Greathouse, MS RD LDN Clinical Inpatient Dietitian Pager: [phone] Weekend/After hours pager: [phone]

## 2013-10-01 NOTE — Progress Notes (Signed)
Occupational Therapy Session Note  Patient Details  Name: [name] MRN: [id number] Date of Birth: 06-Sep-1955  Today's Date: 10/01/2013 Time: 1032-1130 Time Calculation (min): 58 min  Short Term Goals: No short term goals set  Skilled Therapeutic Interventions/Progress Updates:    Engaged in ADL retraining with pt overall supervision for bathing, opting to not wash RLE or change sock.  Completed toilet transfer with RW with close supervision especially with turning in narrow space to sit on toilet.  Engaged in [place] with theraband and weighted dowel rod with additional resistance provided by therapist with theraband. Focus placed on shoulders and biceps/triceps with 2 reps of 15.  Pt performed all transfers with supervision with setup of w/c and w/c parts management.  One verbal cue at end of session for w/c positioning prior to transferring back to bed.  Therapy Documentation Precautions:  Precautions Precautions: Fall Precaution Comments:   Required Braces or Orthoses: Other Brace/Splint Other Brace/Splint: R DARCO shoe when OOB Restrictions Weight Bearing Restrictions: Yes LLE Weight Bearing: Non weight bearing Other Position/Activity Restrictions: keep L knee straight/elevated when in w/c, bed Pain: Pain Assessment Pain Assessment: No/denies pain Pain Score: 0-No pain  See FIM for current functional status  Therapy/Group: Individual Therapy  [name] 10/01/2013, 11:36 AM

## 2013-10-01 NOTE — Consult Note (Signed)
INITIAL DIAGNOSTIC EVALUATION - [facility]   MEDICAL NECESSITY:  Zephyr Osegueda was seen on the [facility] for an initial diagnostic evaluation owing to the patient's diagnosis of left BKA.   According to medical records, Mr. Gasparyan was admitted to the rehab unit owing to "Functional deficits secondary to left BKA, necrotic digits right foot." He has a history of diabetes mellitus, HTN, h/o hepatitis, BLE numbness since frostbite last year but no medical care. He has reportedly been living in his car for past 4-5 years (had declined opportunity to live with family). He was brought to ED on 09/01/13 by family owing to inability to care for self, confusion as well as left foot infection. Imaging was consistent with cellulitis and abscess formation. He was found to have diabetic ketoacidosis and treated with insulin infusion. MRI performed was concerning for necrotizing fasciitis. Mentation has improved.   During today's visit, Mr. Lapenta denied experiencing any major cognitive or emotional symptoms. He said he was initially shocked to learn that he had diabetes and that the disease had led to his amputation. He said he suddenly experienced vision loss, loss of motor coordination, and had a general feeling of being unwell. This led to his family taking him to the ED.    Mr. Benda denied having any history of mental health treatment. His biggest concern was the sudden significant loss of function based off his premorbid baseline. For example, staff mentioned to him that he should be excited about walking from the gym to his room but he says that this was insignificant prior to the surgery. He does feel optimistic though and believes he has made strides in therapy. He said he will transition to skilled nursing after his stay here.   Mr. Homrich has purportedly enjoyed his time on the unit and feels that the staff has been friendly and  informative. However, he does mention getting "stir crazy" at times but he thinks this will abate.   PROCEDURES ADMINISTERED: [1 unit [id number] on 09/30/13] Diagnostic clinical interview  Review of available records  Emotional & Behavioral Evaluation: Mr. Krein was appropriately dressed for season and situation. He was friendly and rapport was easily established. His speech was as expected and he was able to express ideas effectively. His affect was appropriately modulated. Attention and motivation were good.   From an emotional standpoint, Mr. Willenborg denied experiencing any major signs of clinical psychopathology. No major issues adjusting to his hospital stay were endorsed. Suicidal/homicidal ideation, plan or intent was denied. No manic or hypomanic episodes were reported. The patient denied ever experiencing any auditory/visual hallucinations. No major behavioral or personality changes were endorsed.    Overall, Mr. Yuill appears to be adjusting well to his stay on the rehab unit despite his significant loss in functioning and the shock of learning that he has diabetes. He may require follow-up by neuropsychology if he learns he is going to lose his other foot owing to diabetic complications (this possibility was conveyed to this provider via his Education officer, museum). If Mr. Hasser does have to undergo more surgery and his mood worsens, then we would be happy to see him again. Otherwise, he was asked to call on [place] if he required further assistance.   RECOMMENDATIONS    Neuropsychology will follow-up as needed. A follow-up should be generated if his medical situation and/or mood worsen.     Rutha Bouchard, Psy.D.  Clinical Neuropsychologist

## 2013-10-02 ENCOUNTER — Inpatient Hospital Stay (HOSPITAL_COMMUNITY): Payer: Medicaid Other

## 2013-10-02 ENCOUNTER — Inpatient Hospital Stay (HOSPITAL_COMMUNITY): Payer: Medicaid Other | Admitting: Physical Therapy

## 2013-10-02 ENCOUNTER — Inpatient Hospital Stay (HOSPITAL_COMMUNITY): Payer: Medicaid Other | Admitting: Occupational Therapy

## 2013-10-02 LAB — GLUCOSE, CAPILLARY
GLUCOSE-CAPILLARY: 182 mg/dL — AB (ref 70–99)
Glucose-Capillary: 132 mg/dL — ABNORMAL HIGH (ref 70–99)
Glucose-Capillary: 113 mg/dL — ABNORMAL HIGH (ref 70–99)
Glucose-Capillary: 176 mg/dL — ABNORMAL HIGH (ref 70–99)

## 2013-10-02 MED ORDER — LEVOFLOXACIN 750 MG PO TABS
750.0000 mg | ORAL_TABLET | Freq: Every day | ORAL | Status: DC
  Filled 2013-10-02 (×2): qty 1

## 2013-10-02 MED ORDER — HYDROCERIN EX CREA
1.0000 "application " | TOPICAL_CREAM | Freq: Two times a day (BID) | CUTANEOUS | Status: DC
  Administered 2013-10-02 – 2013-10-22 (×41): 1 via TOPICAL
  Filled 2013-10-02: qty 113

## 2013-10-02 NOTE — Progress Notes (Signed)
Subjective/Complaints: Right low and posterior rib pain, no radiation to R hip, pain with deep breath  No lower extremity pain no phantom sensation   Review of Systems - no pains, no nausea or vomiting  Objective: Vital Signs: Blood pressure 107/70, pulse 102, temperature 99.3 F (37.4 C), temperature source Oral, resp. rate 17, height 5\' 3"  (1.6 m), weight 66.7 kg (147 lb 0.8 oz), SpO2 93.00%. No results found. Results for orders placed during the hospital encounter of 09/13/13 (from the past 72 hour(s))  GLUCOSE, CAPILLARY     Status: Abnormal   Collection Time    09/29/13 11:32 AM      Result Value Ref Range   Glucose-Capillary 117 (*) 70 - 99 mg/dL   Comment 1 Notify RN    GLUCOSE, CAPILLARY     Status: Abnormal   Collection Time    09/29/13  4:31 PM      Result Value Ref Range   Glucose-Capillary 160 (*) 70 - 99 mg/dL   Comment 1 Notify RN    GLUCOSE, CAPILLARY     Status: Abnormal   Collection Time    09/29/13  8:39 PM      Result Value Ref Range   Glucose-Capillary 160 (*) 70 - 99 mg/dL  GLUCOSE, CAPILLARY     Status: Abnormal   Collection Time    09/30/13  7:26 AM      Result Value Ref Range   Glucose-Capillary 146 (*) 70 - 99 mg/dL   Comment 1 Notify RN    GLUCOSE, CAPILLARY     Status: Abnormal   Collection Time    09/30/13 11:19 AM      Result Value Ref Range   Glucose-Capillary 141 (*) 70 - 99 mg/dL   Comment 1 Notify RN    GLUCOSE, CAPILLARY     Status: Abnormal   Collection Time    09/30/13  4:17 PM      Result Value Ref Range   Glucose-Capillary 152 (*) 70 - 99 mg/dL   Comment 1 Notify RN    GLUCOSE, CAPILLARY     Status: None   Collection Time    09/30/13  9:11 PM      Result Value Ref Range   Glucose-Capillary 76  70 - 99 mg/dL  HEMOGLOBIN AND HEMATOCRIT, BLOOD     Status: Abnormal   Collection Time    10/01/13  7:00 AM      Result Value Ref Range   Hemoglobin 11.7 (*) 13.0 - 17.0 g/dL   HCT 35.5 (*) 39.0 - 52.0 %  GLUCOSE, CAPILLARY      Status: Abnormal   Collection Time    10/01/13  7:16 AM      Result Value Ref Range   Glucose-Capillary 145 (*) 70 - 99 mg/dL  GLUCOSE, CAPILLARY     Status: Abnormal   Collection Time    10/01/13 11:26 AM      Result Value Ref Range   Glucose-Capillary 139 (*) 70 - 99 mg/dL  GLUCOSE, CAPILLARY     Status: Abnormal   Collection Time    10/01/13  4:37 PM      Result Value Ref Range   Glucose-Capillary 125 (*) 70 - 99 mg/dL  GLUCOSE, CAPILLARY     Status: Abnormal   Collection Time    10/01/13  9:07 PM      Result Value Ref Range   Glucose-Capillary 133 (*) 70 - 99 mg/dL      HENT: dentition  poor  Head: Normocephalic.  Eyes: EOM are normal.  Neck: Normal range of motion. Neck supple. No thyromegaly present.  Cardiovascular: Normal rate and regular rhythm. No murmurs  Respiratory: Effort normal and breath sounds normal. No respiratory distress.  GI: Soft. Bowel sounds are normal. He exhibits no distension.  Musculoskeletal: Mild tenderness Left flank/12th rib area, mod tenderness R 12th rib area  Shoulder , elbow, wrist And hand ROM normal  Neurological: He is alert and oriented to person, place, and time. A sensory deficit is present.  5/5 strength Bilateral delt, bi, tri, grip,  4/5 Bilateral HF and R KE  3- R ankle DF/PF   Ext no calf swelling or tenderness  Assessment/Plan: 1. Functional deficits secondary to Left BKA for gangrene (diabetic neuropathy, undiagnosed DM, no medical care) which require 3+ hours per day of interdisciplinary therapy in a comprehensive inpatient rehab setting. Physiatrist is providing close team supervision and 24 hour management of active medical problems listed below. Physiatrist and rehab team continue to assess barriers to discharge/monitor patient progress toward functional and medical goals.  FIM: FIM - Bathing Bathing Steps Patient Completed: Chest;Right Arm;Left Arm;Abdomen;Front perineal area;Buttocks;Right upper leg;Left upper  leg Bathing: 4: Min-Patient completes 8-9 70f 10 parts or 75+ percent  FIM - Upper Body Dressing/Undressing Upper body dressing/undressing: 0: Wears gown/pajamas-no public clothing FIM - Lower Body Dressing/Undressing Lower body dressing/undressing steps patient completed: Thread/unthread right pants leg;Thread/unthread left pants leg;Pull pants up/down;Don/Doff right shoe;Fasten/unfasten right shoe Lower body dressing/undressing: 5: Supervision: Safety issues/verbal cues  FIM - Toileting Toileting steps completed by patient: Adjust clothing prior to toileting;Performs perineal hygiene;Adjust clothing after toileting Toileting Assistive Devices: Grab bar or rail for support Toileting: 7: Independent: No helper, no device  FIM - Radio producer Devices: Grab bars Toilet Transfers: 5-From toilet/BSC: Supervision (verbal cues/safety issues);5-To toilet/BSC: Supervision (verbal cues/safety issues)  FIM - Engineer, site Assistive Devices: Arm rests Bed/Chair Transfer: 6: Supine > Sit: No assist;6: Sit > Supine: No assist;6: Bed > Chair or W/C: No assist;6: Chair or W/C > Bed: No assist  FIM - Locomotion: Wheelchair Distance: 150 Locomotion: Wheelchair: 6: Travels 150 ft or more, turns around, maneuvers to table, bed or toilet, negotiates 3% grade: maneuvers on rugs and over door sills independently FIM - Locomotion: Ambulation Locomotion: Ambulation Assistive Devices: Administrator Ambulation/Gait Assistance: 5: Supervision;4: Min guard Locomotion: Ambulation: 0: Activity did not occur  Comprehension Comprehension Mode: Auditory Comprehension: 6-Follows complex conversation/direction: With extra time/assistive device  Expression Expression Mode: Verbal Expression: 7-Expresses complex ideas: With no assist  Social Interaction Social Interaction: 7-Interacts appropriately with others - No medications needed.  Problem  Solving Problem Solving: 6-Solves complex problems: With extra time  Memory Memory: 6-Assistive device: No helper  Medical Problem List and Plan:  Left BKA, and R necrotic toes  1. DVT Prophylaxis/Anticoagulation:   SQ lovenox.  2. Pain Management: Prn oxcycodone.  3. Mood: Does not express any signs of distress or concerns. LCSW to follow for evaluation.  4. Neuropsych: This patient is capable of making decisions on his own behalf.  5. DM type 2 controlled: Cont current regimen, titrate as indicated.    6. Anemia , guaic - x 1 neg, Hgb 11.4 remains with mild tachy, monitor 7.  Rib pain, check films    LOS (Days) 19 A FACE TO FACE EVALUATION WAS PERFORMED  Jayle Solarz E 10/02/2013, 7:23 AM

## 2013-10-02 NOTE — Progress Notes (Signed)
Occupational Therapy Session Note  Patient Details  Name: [name] MRN: [id number] Date of Birth: 1956/02/25  Today's Date: 10/02/2013 Time: [id number] Time Calculation (min): 43 min  Short Term Goals: No short term goals set  Skilled Therapeutic Interventions/Progress Updates:    Engaged in therapeutic exercise with focus on dynamic sitting balance and trunk rotation.  Pt donned Darco shoe seated EOB with increased time due to decreased trunk control in unsupported sitting coupled with bending forward.  Engaged in reaching task on elevated therapy mat to eliminate RLE support on floor to challenge balance with pt scooting forward to steady self with shoe on floor.  Engaged in trunk rotation with 4# medicine ball 2 reps of 15 and PNF pattern reaching 2 reps of 15.  Pt reports muscle strain in Rt flank area from PT yesterday, trunk rotation exercises to challenge trunk control as well as attempt to provide stretch to Rt flank.  Pt propelled w/c back to room and completed stand pivot transfer to bed with supervision, due to not fully locking brake on Lt side despite verbal cue.  Therapy Documentation Precautions:  Precautions Precautions: Fall Precaution Comments:   Required Braces or Orthoses: Other Brace/Splint Other Brace/Splint: R DARCO shoe when OOB Restrictions Weight Bearing Restrictions: Yes LLE Weight Bearing: Non weight bearing Other Position/Activity Restrictions: keep L knee straight/elevated when in w/c, bed Pain:  Pt with no c/o pain  See FIM for current functional status  Therapy/Group: Individual Therapy  [name] 10/02/2013, 2:32 PM

## 2013-10-02 NOTE — Progress Notes (Signed)
Physical Therapy Session Note  Patient Details  Name: [name] MRN: [id number] Date of Birth: 1956/05/14  Today's Date: 10/02/2013 Time: 0927-1003 Time Calculation (min): 36 min  Short Term Goals: No short term goals set  Skilled Therapeutic Interventions/Progress Updates:   Pt continues to progress; gait and standing balance goals upgraded and stair goal reactivated for strengthening and community access.  Pt resting in bed reporting R rib pain with deep breathing and movement but reports he has had mm relaxer prior to therapy and discomfort is tolerable.  Pt performed bed mobility and bed > w/c transfer, w/c set up and mobility mod I.  Performed dynamic standing balance training standing on balance foam with UE support on RW while alternating R and LUE lifting off RW to reach out of BOS, across midline and with squat to reach for horseshoes and toss 2 sets x 12 reps (6 each UE) with one sitting rest break secondary to ankle fatigue.  Performed gait training x 100' with RW and min guard; pt reporting R rib pain during gait. Pt returned to sitting in w/c and transitioned back to room mod I.  Pt requesting to stay up in w/c and then transfer back to bed later.     Therapy Documentation Precautions:  Precautions Precautions: Fall Precaution Comments:   Required Braces or Orthoses: Other Brace/Splint Other Brace/Splint: R DARCO shoe when OOB Restrictions Weight Bearing Restrictions: Yes LLE Weight Bearing: Non weight bearing Other Position/Activity Restrictions: keep L knee straight/elevated when in w/c, bed Pain: Pain Assessment Pain Score: 3  Locomotion : Ambulation Ambulation/Gait Assistance: 4: Min guard Wheelchair Mobility Distance: 150   See FIM for current functional status  Therapy/Group: Individual Therapy  Raylene Everts [facility] 10/02/2013, 11:36 AM

## 2013-10-02 NOTE — Progress Notes (Signed)
Social Work Patient ID: [name], male   DOB: 1956-02-25, 58 y.o.   MRN: [id number] Met with pt to discuss team conference and looking for [facility] bed since doing well and supervision level.  He reports strained his muscle in therapy yesterday. He is tired of being here but understands the search and will continue his rehab.

## 2013-10-02 NOTE — Patient Care Conference (Signed)
Inpatient RehabilitationTeam Conference and Plan of Care Update Date: 10/02/2013   Time: 11:55 Am    Patient Name: [name]      Medical Record Number: [id number]  Date of Birth: [birth year] Sex: Male         Room/Bed: 4M04C/4M04C-01 Payor Info: Payor: /    Admitting Diagnosis: L BKA  Admit Date/Time:  09/13/2013  4:36 PM Admission Comments: No comment available   Primary Diagnosis:  Unilateral complete BKA Principal Problem: Unilateral complete BKA  Patient Active Problem List   Diagnosis Date Noted  . Gangrene of toe 09/16/2013  . Fever with chills 09/16/2013  . Unilateral complete BKA 09/13/2013  . DKA (diabetic ketoacidoses) 09/04/2013  . 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 DM (diabetes mellitus) type II controlled, neurological manifestation 09/01/2013    Expected Discharge Date:    Team Members Present: Physician leading conference: Dr. Alysia Penna Social Worker Present: Alfonse Alpers, LCSW;Becky Gaelle Adriance, LCSW Nurse Present: Other (comment) (Whitney Reardon-RN) PT Present: Raylene Everts, PT;Emily Rinaldo Cloud, PT OT Present: [name], Lorelee Cover, OT SLP Present: Germain Osgood, SLP PPS Coordinator present : Ileana Ladd, Lelan Pons, RN, CRRN     Current Status/Progress Goal Weekly Team Focus  Medical   anemia stable, diabetes, pain with inspiration   monitor pain with inspiration  monitor medical status   Bowel/Bladder   Continent of bowel and bladder; LBM 3/3  Remain continent of bowel and bladder  Maintain current regimen   Swallow/Nutrition/ Hydration     [facility]        ADL's   supervision overall  supervision overall, mod I toileting  activity tolerance/endurance, standing balance, RLE care/limb wrapping   Mobility   Mod I overall except supervision gait and min A stairs  Transfer and w/c goals met, gait and standing balance goals upgraded, stair goal reactivated; awaiting NHP  HEP, gait and standing balance, stairs  for strengthening   Communication     [facility]        Safety/Cognition/ Behavioral Observations  n/a         Pain   Denies pain         Skin   L BKA- sutures intact- no drainage; right foot 1st, 2nd, and 5th toe black and cracking.  Heel cracking  No new skin breakdown or infection  Perform daily dressing change.  Assess skin q shift and prn      *See Care Plan and progress notes for long and short-term goals.  Barriers to Discharge: no appropriate D/C environment    Possible Resolutions to Barriers:  SNF vs Rest home    Discharge Planning/Teaching Needs:  Looking for NH bed, no offers. Pt aware and not feeling well today      Team Discussion:  Having rib pain-feels muscular and strained it in therapy yesterday.  Othro to see tomorrow to remove sutures.  Will update FL 2 and see if can obtain a bed-moving toward RH level  Revisions to Treatment Plan:  NHP_   Continued Need for Acute Rehabilitation Level of Care: The patient requires daily medical management by a physician with specialized training in physical medicine and rehabilitation for the following conditions: Daily direction of a multidisciplinary physical rehabilitation program to ensure safe treatment while eliciting the highest outcome that is of practical value to the patient.: Yes Daily medical management of patient stability for increased activity during participation in an intensive rehabilitation regime.: Yes Daily analysis of laboratory values and/or radiology reports  with any subsequent need for medication adjustment of medical intervention for : Neurological problems;Post surgical problems  Avary Eichenberger, [name] 10/02/2013, 2:05 PM

## 2013-10-03 ENCOUNTER — Inpatient Hospital Stay (HOSPITAL_COMMUNITY): Payer: Medicaid Other | Admitting: Physical Therapy

## 2013-10-03 ENCOUNTER — Inpatient Hospital Stay (HOSPITAL_COMMUNITY): Payer: Medicaid Other | Admitting: Occupational Therapy

## 2013-10-03 LAB — GLUCOSE, CAPILLARY
GLUCOSE-CAPILLARY: 153 mg/dL — AB (ref 70–99)
Glucose-Capillary: 174 mg/dL — ABNORMAL HIGH (ref 70–99)
Glucose-Capillary: 199 mg/dL — ABNORMAL HIGH (ref 70–99)
Glucose-Capillary: 184 mg/dL — ABNORMAL HIGH (ref 70–99)

## 2013-10-03 NOTE — Progress Notes (Signed)
Physical Therapy Session Note  Patient Details  Name: [name] MRN: [id number] Date of Birth: 1955/08/28  Today's Date: 10/03/2013 Time: 1021-1100 Time Calculation (min): 39 min  Short Term Goals: No short term goals set  Skilled Therapeutic Interventions/Progress Updates:   Pt resting in bed; reporting sleeping very well last night secondary to sleeping medication + mm relaxer.  Reporting improved rib pain today but still noticeable during movement.  Transferred supine > sit EOB mod I; reporting he has a long sleeve shirt on dresser.  Pt donned shirt EOB independently.  Transfers bed <> w/c mod I squat/stand pivot.  Mod I w/c mobility on unit.  Performed standing LE exercises in // bars: 12 reps LLE hip flexion, hip flexion with knee extension, hip ABD, hip extension, hamstring curls and squats with one sitting rest break.  Performed gait x 125' with RW and supervision with intermittent c/o rib pain and SOB.  Sp02 WNL.  Returned to room and to EOB ;discussed with pt asking surgeon if he would be cleared to WB through L knee in standing once sutures removed to begin WB through LLE and closed chain strengthening of hip stabilization muscles. Pt reports he will ask.    Therapy Documentation Precautions:  Precautions Precautions: Fall Precaution Comments:   Required Braces or Orthoses: Other Brace/Splint Other Brace/Splint: R DARCO shoe when OOB Restrictions Weight Bearing Restrictions: Yes LLE Weight Bearing: Non weight bearing Other Position/Activity Restrictions: keep L knee straight/elevated when in w/c, bed Vital Signs: Therapy Vitals Pulse Rate: 115 Patient Position, if appropriate: Sitting Oxygen Therapy SpO2: 95 % Pain: Pain Assessment Pain Assessment: 0-10 Pain Score: 2  Pain Type: Acute pain Pain Location: Rib cage Pain Orientation: Right Pain Descriptors / Indicators: Sharp Pain Onset: With Activity Pain Intervention(s): Rest Locomotion  : Ambulation Ambulation/Gait Assistance: 5: Supervision Wheelchair Mobility Distance: 150   See FIM for current functional status  Therapy/Group: Individual Therapy  [name] 10/03/2013, 11:13 AM

## 2013-10-03 NOTE — Progress Notes (Signed)
Occupational Therapy Session Note  Patient Details  Name: Demaria Epting MRN: [id number] Date of Birth: 03-06-1956  Today's Date: 10/03/2013 Time: [id number] Time Calculation (min): 45 min  Short Term Goals: Week 1:  OT Short Term Goal 1 (Week 1): Short Term Goals = Long Term Goals  Skilled Therapeutic Interventions/Progress Updates:  Patient resting in bed upon arrival. Engaged in toilet transfers, toileting, and BUE strengthening exercises.  Patient ambulated with RW to bathroom and transferred to toilet with use of R & L grab bars for clothing management and sit><stand and leaned on the wall occasionally when managing pants to enable use of BUEs.  Patient propelled w/c to therapy gym and performed UE exercises with 5# bar as well as theraband.  Inspection mirror issued and demonstrated use of inspection of right foot and left residual limb when dressing off.  Patient performed squat pivot transfer back to bed with fully removing amputee pad making for an unsafe transfer.  Before transfer this was brought to his attention than the pad might be in his way and when the transfer did not go well he said, "I know, I really don't care".  Therapy Documentation Precautions:  Precautions Precautions: Fall Precaution Comments:   Required Braces or Orthoses: Other Brace/Splint Other Brace/Splint: R DARCO shoe when OOB Restrictions Weight Bearing Restrictions: Yes LLE Weight Bearing: Non weight bearing Other Position/Activity Restrictions: keep L knee straight/elevated when in w/c, bed Pain: Rib area on right, muscle pull in therapy a few days ago, not rated, rest repositioned  See FIM for current functional status  Therapy/Group: Individual Therapy  Havard Radigan 10/03/2013, 4:02 PM

## 2013-10-03 NOTE — Progress Notes (Signed)
Social Work Patient ID: [name], male   DOB: Nov 02, 1955, 58 y.o.   MRN: [id number] Pt aware looking for RH bed and searching 50 miles and up.  He understands and would like to leave here soon, tired of the hospital. He slept well last night but feels he had too much medicine.

## 2013-10-03 NOTE — Progress Notes (Signed)
Subjective/Complaints: Right low and posterior rib pain, improved No cough or SOB No R foot pain  No lower extremity pain no phantom sensation   Review of Systems - no pains, no nausea or vomiting  Objective: Vital Signs: Blood pressure 117/76, pulse 95, temperature 99.2 F (37.3 C), temperature source Oral, resp. rate 18, height 5\' 3"  (1.6 m), weight 66.7 kg (147 lb 0.8 oz), SpO2 94.00%. Dg Chest 2 View  10/02/2013   CLINICAL DATA:  Right-sided chest and rib pain with deep breath  EXAM: CHEST  2 VIEW  COMPARISON:  Chest x-ray of 09/25/2013  FINDINGS: There is still opacity posteriorly in the lung base on the lateral view suspicious for pneumonia and small effusion. A degree of mild pulmonary vascular congestion however cannot be excluded. The heart is borderline enlarged. There are degenerative changes throughout the thoracic spine.  IMPRESSION: Favored pneumonia in a posterior lower lobe with small effusion. Cannot exclude superimposed mild pulmonary vascular congestion.   Electronically Signed   By: Ivar Drape M.D.   On: 10/02/2013 08:11   Results for orders placed during the hospital encounter of 09/13/13 (from the past 72 hour(s))  GLUCOSE, CAPILLARY     Status: Abnormal   Collection Time    09/30/13  7:26 AM      Result Value Ref Range   Glucose-Capillary 146 (*) 70 - 99 mg/dL   Comment 1 Notify RN    GLUCOSE, CAPILLARY     Status: Abnormal   Collection Time    09/30/13 11:19 AM      Result Value Ref Range   Glucose-Capillary 141 (*) 70 - 99 mg/dL   Comment 1 Notify RN    GLUCOSE, CAPILLARY     Status: Abnormal   Collection Time    09/30/13  4:17 PM      Result Value Ref Range   Glucose-Capillary 152 (*) 70 - 99 mg/dL   Comment 1 Notify RN    GLUCOSE, CAPILLARY     Status: None   Collection Time    09/30/13  9:11 PM      Result Value Ref Range   Glucose-Capillary 76  70 - 99 mg/dL  HEMOGLOBIN AND HEMATOCRIT, BLOOD     Status: Abnormal   Collection Time    10/01/13   7:00 AM      Result Value Ref Range   Hemoglobin 11.7 (*) 13.0 - 17.0 g/dL   HCT 35.5 (*) 39.0 - 52.0 %  GLUCOSE, CAPILLARY     Status: Abnormal   Collection Time    10/01/13  7:16 AM      Result Value Ref Range   Glucose-Capillary 145 (*) 70 - 99 mg/dL  GLUCOSE, CAPILLARY     Status: Abnormal   Collection Time    10/01/13 11:26 AM      Result Value Ref Range   Glucose-Capillary 139 (*) 70 - 99 mg/dL  GLUCOSE, CAPILLARY     Status: Abnormal   Collection Time    10/01/13  4:37 PM      Result Value Ref Range   Glucose-Capillary 125 (*) 70 - 99 mg/dL  GLUCOSE, CAPILLARY     Status: Abnormal   Collection Time    10/01/13  9:07 PM      Result Value Ref Range   Glucose-Capillary 133 (*) 70 - 99 mg/dL  GLUCOSE, CAPILLARY     Status: Abnormal   Collection Time    10/02/13  7:35 AM  Result Value Ref Range   Glucose-Capillary 132 (*) 70 - 99 mg/dL   Comment 1 Notify RN    GLUCOSE, CAPILLARY     Status: Abnormal   Collection Time    10/02/13 11:27 AM      Result Value Ref Range   Glucose-Capillary 113 (*) 70 - 99 mg/dL   Comment 1 Notify RN    GLUCOSE, CAPILLARY     Status: Abnormal   Collection Time    10/02/13  4:47 PM      Result Value Ref Range   Glucose-Capillary 176 (*) 70 - 99 mg/dL   Comment 1 Notify RN    GLUCOSE, CAPILLARY     Status: Abnormal   Collection Time    10/02/13  8:19 PM      Result Value Ref Range   Glucose-Capillary 182 (*) 70 - 99 mg/dL   Comment 1 Notify RN        HENT: dentition poor  Head: Normocephalic.  Eyes: EOM are normal.  Neck: Normal range of motion. Neck supple. No thyromegaly present.  Cardiovascular: Normal rate and regular rhythm. No murmurs  Respiratory: Effort normal and breath sounds normal. No respiratory distress.  GI: Soft. Bowel sounds are normal. He exhibits no distension.  Musculoskeletal:, No  tenderness R 12th rib area Ext_ gangrenous changes R toes 1,2,5 Shoulder , elbow, wrist And hand ROM normal  Neurological:  He is alert and oriented to person, place, and time. A sensory deficit is present R foot 5/5 strength Bilateral delt, bi, tri, grip,  4/5 Bilateral HF and R KE  3- R ankle DF/PF  Ext no calf swelling or tenderness  Assessment/Plan: 1. Functional deficits secondary to Left BKA for gangrene (diabetic neuropathy, undiagnosed DM, no medical care) which require 3+ hours per day of interdisciplinary therapy in a comprehensive inpatient rehab setting. Physiatrist is providing close team supervision and 24 hour management of active medical problems listed below. Physiatrist and rehab team continue to assess barriers to discharge/monitor patient progress toward functional and medical goals.  FIM: FIM - Bathing Bathing Steps Patient Completed: Chest;Right Arm;Left Arm;Abdomen;Front perineal area;Buttocks;Right upper leg;Left upper leg Bathing: 4: Min-Patient completes 8-9 60f 10 parts or 75+ percent  FIM - Upper Body Dressing/Undressing Upper body dressing/undressing: 0: Wears gown/pajamas-no public clothing FIM - Lower Body Dressing/Undressing Lower body dressing/undressing steps patient completed: Thread/unthread right pants leg;Thread/unthread left pants leg;Pull pants up/down;Don/Doff right shoe;Fasten/unfasten right shoe Lower body dressing/undressing: 5: Supervision: Safety issues/verbal cues  FIM - Toileting Toileting steps completed by patient: Adjust clothing prior to toileting;Performs perineal hygiene;Adjust clothing after toileting Toileting Assistive Devices: Grab bar or rail for support Toileting: 7: Independent: No helper, no device  FIM - Radio producer Devices: Grab bars Toilet Transfers: 5-From toilet/BSC: Supervision (verbal cues/safety issues);5-To toilet/BSC: Supervision (verbal cues/safety issues)  FIM - Engineer, site Assistive Devices: Arm rests Bed/Chair Transfer: 6: Supine > Sit: No assist;6: Sit > Supine: No  assist;6: Bed > Chair or W/C: No assist;6: Chair or W/C > Bed: No assist  FIM - Locomotion: Wheelchair Distance: 150 Locomotion: Wheelchair: 6: Travels 150 ft or more, turns around, maneuvers to table, bed or toilet, negotiates 3% grade: maneuvers on rugs and over door sills independently FIM - Locomotion: Ambulation Locomotion: Ambulation Assistive Devices: Administrator Ambulation/Gait Assistance: 4: Min guard Locomotion: Ambulation: 2: Travels 50 - 149 ft with minimal assistance (Pt.>75%)  Comprehension Comprehension Mode: Auditory Comprehension: 6-Follows complex conversation/direction: With extra time/assistive device  Expression Expression Mode: Verbal Expression: 7-Expresses complex ideas: With no assist  Social Interaction Social Interaction: 7-Interacts appropriately with others - No medications needed.  Problem Solving Problem Solving: 6-Solves complex problems: With extra time  Memory Memory: 6-Assistive device: No helper  Medical Problem List and Plan:  Left BKA, and R necrotic toes  1. DVT Prophylaxis/Anticoagulation:   SQ lovenox.  2. Pain Management: Prn oxcycodone.  3. Mood: Does not express any signs of distress or concerns. LCSW to follow for evaluation.  4. Neuropsych: This patient is capable of making decisions on his own behalf.  5. DM type 2 controlled: Cont current regimen, titrate as indicated.    6. Anemia , guaic - x 1 neg, Hgb 11.4 remains with mild tachy, monitor 7.  Rib pain, resolved, Xrays showing atelectatic changes no signs or symptoms of PNA    LOS (Days) 20 A FACE TO FACE EVALUATION WAS PERFORMED  Leaira Fullam E 10/03/2013, 6:19 AM

## 2013-10-04 ENCOUNTER — Inpatient Hospital Stay (HOSPITAL_COMMUNITY): Payer: Medicaid Other | Admitting: Occupational Therapy

## 2013-10-04 ENCOUNTER — Inpatient Hospital Stay (HOSPITAL_COMMUNITY): Payer: Medicaid Other | Admitting: Physical Therapy

## 2013-10-04 DIAGNOSIS — L02619 Cutaneous abscess of unspecified foot: Secondary | ICD-10-CM

## 2013-10-04 DIAGNOSIS — E1310 Other specified diabetes mellitus with ketoacidosis without coma: Secondary | ICD-10-CM

## 2013-10-04 DIAGNOSIS — L03119 Cellulitis of unspecified part of limb: Secondary | ICD-10-CM

## 2013-10-04 DIAGNOSIS — L98499 Non-pressure chronic ulcer of skin of other sites with unspecified severity: Secondary | ICD-10-CM

## 2013-10-04 DIAGNOSIS — S88119A Complete traumatic amputation at level between knee and ankle, unspecified lower leg, initial encounter: Secondary | ICD-10-CM

## 2013-10-04 DIAGNOSIS — I739 Peripheral vascular disease, unspecified: Secondary | ICD-10-CM

## 2013-10-04 DIAGNOSIS — M726 Necrotizing fasciitis: Secondary | ICD-10-CM

## 2013-10-04 LAB — GLUCOSE, CAPILLARY
GLUCOSE-CAPILLARY: 161 mg/dL — AB (ref 70–99)
Glucose-Capillary: 117 mg/dL — ABNORMAL HIGH (ref 70–99)
Glucose-Capillary: 196 mg/dL — ABNORMAL HIGH (ref 70–99)
Glucose-Capillary: 130 mg/dL — ABNORMAL HIGH (ref 70–99)

## 2013-10-04 MED ORDER — INSULIN ASPART PROT & ASPART (70-30 MIX) 100 UNIT/ML ~~LOC~~ SUSP
15.0000 [IU] | Freq: Every day | SUBCUTANEOUS | Status: DC
  Administered 2013-10-04 – 2013-10-06 (×3): 15 [IU] via SUBCUTANEOUS

## 2013-10-04 MED ORDER — INSULIN ASPART PROT & ASPART (70-30 MIX) 100 UNIT/ML ~~LOC~~ SUSP
20.0000 [IU] | Freq: Every day | SUBCUTANEOUS | Status: DC
  Administered 2013-10-05 – 2013-10-22 (×18): 20 [IU] via SUBCUTANEOUS

## 2013-10-04 MED ORDER — INSULIN ASPART PROT & ASPART (70-30 MIX) 100 UNIT/ML ~~LOC~~ SUSP: 15.0000 [IU] | Freq: Two times a day (BID) | SUBCUTANEOUS | Status: DC

## 2013-10-04 NOTE — Progress Notes (Signed)
Social Work Patient ID: [name], male   DOB: [birth year], 58 y.o.   MRN: [id number] Pt was told this am he was leaving today.  This is not true and informed him if bed offer was made he would be the first to know. Working on bed offer but do not have one.  Pt reported: " I thought you would have told me."  Pt will be the first to know when bed offer is made.

## 2013-10-04 NOTE — Progress Notes (Signed)
Subjective/Complaints: Right low and posterior rib pain, improved No cough or SOB No R foot pain  No lower extremity pain no phantom sensation   Review of Systems - no pains, no nausea or vomiting  Objective: Vital Signs: Blood pressure 106/70, pulse 110, temperature 99.6 F (37.6 C), temperature source Oral, resp. rate 20, height 5\' 3"  (1.6 m), weight 66.7 kg (147 lb 0.8 oz), SpO2 94.00%. Dg Chest 2 View  10/02/2013   CLINICAL DATA:  Right-sided chest and rib pain with deep breath  EXAM: CHEST  2 VIEW  COMPARISON:  Chest x-ray of 09/25/2013  FINDINGS: There is still opacity posteriorly in the lung base on the lateral view suspicious for pneumonia and small effusion. A degree of mild pulmonary vascular congestion however cannot be excluded. The heart is borderline enlarged. There are degenerative changes throughout the thoracic spine.  IMPRESSION: Favored pneumonia in a posterior lower lobe with small effusion. Cannot exclude superimposed mild pulmonary vascular congestion.   Electronically Signed   By: Ivar Drape M.D.   On: 10/02/2013 08:11   Results for orders placed during the hospital encounter of 09/13/13 (from the past 72 hour(s))  GLUCOSE, CAPILLARY     Status: Abnormal   Collection Time    10/01/13  7:16 AM      Result Value Ref Range   Glucose-Capillary 145 (*) 70 - 99 mg/dL  GLUCOSE, CAPILLARY     Status: Abnormal   Collection Time    10/01/13 11:26 AM      Result Value Ref Range   Glucose-Capillary 139 (*) 70 - 99 mg/dL  GLUCOSE, CAPILLARY     Status: Abnormal   Collection Time    10/01/13  4:37 PM      Result Value Ref Range   Glucose-Capillary 125 (*) 70 - 99 mg/dL  GLUCOSE, CAPILLARY     Status: Abnormal   Collection Time    10/01/13  9:07 PM      Result Value Ref Range   Glucose-Capillary 133 (*) 70 - 99 mg/dL  GLUCOSE, CAPILLARY     Status: Abnormal   Collection Time    10/02/13  7:35 AM      Result Value Ref Range   Glucose-Capillary 132 (*) 70 - 99 mg/dL    Comment 1 Notify RN    GLUCOSE, CAPILLARY     Status: Abnormal   Collection Time    10/02/13 11:27 AM      Result Value Ref Range   Glucose-Capillary 113 (*) 70 - 99 mg/dL   Comment 1 Notify RN    GLUCOSE, CAPILLARY     Status: Abnormal   Collection Time    10/02/13  4:47 PM      Result Value Ref Range   Glucose-Capillary 176 (*) 70 - 99 mg/dL   Comment 1 Notify RN    GLUCOSE, CAPILLARY     Status: Abnormal   Collection Time    10/02/13  8:19 PM      Result Value Ref Range   Glucose-Capillary 182 (*) 70 - 99 mg/dL   Comment 1 Notify RN    GLUCOSE, CAPILLARY     Status: Abnormal   Collection Time    10/03/13  7:32 AM      Result Value Ref Range   Glucose-Capillary 153 (*) 70 - 99 mg/dL  GLUCOSE, CAPILLARY     Status: Abnormal   Collection Time    10/03/13 11:38 AM      Result Value Ref Range  Glucose-Capillary 174 (*) 70 - 99 mg/dL  GLUCOSE, CAPILLARY     Status: Abnormal   Collection Time    10/03/13  4:49 PM      Result Value Ref Range   Glucose-Capillary 199 (*) 70 - 99 mg/dL  GLUCOSE, CAPILLARY     Status: Abnormal   Collection Time    10/03/13  8:56 PM      Result Value Ref Range   Glucose-Capillary 184 (*) 70 - 99 mg/dL      HENT: dentition poor  Head: Normocephalic.  Eyes: EOM are normal.  Neck: Normal range of motion. Neck supple. No thyromegaly present.  Cardiovascular: Normal rate and regular rhythm. No murmurs  Respiratory: Effort normal and breath sounds normal. No respiratory distress.  GI: Soft. Bowel sounds are normal. He exhibits no distension.   Shoulder , elbow, wrist And hand ROM normal  Neurological: He is alert and oriented to person, place, and time.  5/5 strength Bilateral delt, bi, tri, grip,  4/5 Bilateral HF and R KE  3- R ankle DF/PF  Ext no calf swelling or tenderness  Assessment/Plan: 1. Functional deficits secondary to Left BKA for gangrene (diabetic neuropathy, undiagnosed DM, no medical care) which require 3+ hours per day of  interdisciplinary therapy in a comprehensive inpatient rehab setting. Physiatrist is providing close team supervision and 24 hour management of active medical problems listed below. Physiatrist and rehab team continue to assess barriers to discharge/monitor patient progress toward functional and medical goals.  FIM: FIM - Bathing Bathing Steps Patient Completed: Chest;Right Arm;Left Arm;Abdomen;Front perineal area;Buttocks;Right upper leg;Left upper leg Bathing: 4: Min-Patient completes 8-9 69f 10 parts or 75+ percent  FIM - Upper Body Dressing/Undressing Upper body dressing/undressing: 0: Wears gown/pajamas-no public clothing FIM - Lower Body Dressing/Undressing Lower body dressing/undressing steps patient completed: Thread/unthread right pants leg;Thread/unthread left pants leg;Pull pants up/down;Don/Doff right shoe;Fasten/unfasten right shoe Lower body dressing/undressing: 5: Supervision: Safety issues/verbal cues  FIM - Toileting Toileting steps completed by patient: Adjust clothing prior to toileting;Performs perineal hygiene;Adjust clothing after toileting Toileting Assistive Devices: Grab bar or rail for support Toileting: 7: Independent: No helper, no device  FIM - Radio producer Devices: Grab bars Toilet Transfers: 5-From toilet/BSC: Supervision (verbal cues/safety issues);5-To toilet/BSC: Supervision (verbal cues/safety issues)  FIM - Engineer, site Assistive Devices: Arm rests Bed/Chair Transfer: 6: Supine > Sit: No assist;6: Sit > Supine: No assist;6: Bed > Chair or W/C: No assist;6: Chair or W/C > Bed: No assist  FIM - Locomotion: Wheelchair Distance: 150 Locomotion: Wheelchair: 6: Travels 150 ft or more, turns around, maneuvers to table, bed or toilet, negotiates 3% grade: maneuvers on rugs and over door sills independently FIM - Locomotion: Ambulation Locomotion: Ambulation Assistive Devices: Astronomer Ambulation/Gait Assistance: 5: Supervision Locomotion: Ambulation: 2: Travels 50 - 149 ft with supervision/safety issues  Comprehension Comprehension Mode: Auditory Comprehension: 6-Follows complex conversation/direction: With extra time/assistive device  Expression Expression Mode: Verbal Expression: 7-Expresses complex ideas: With no assist  Social Interaction Social Interaction: 7-Interacts appropriately with others - No medications needed.  Problem Solving Problem Solving: 6-Solves complex problems: With extra time  Memory Memory: 6-Assistive device: No helper  Medical Problem List and Plan:  Left BKA, and R necrotic toes  1. DVT Prophylaxis/Anticoagulation:   SQ lovenox.  2. Pain Management: Prn oxcycodone.  3. Mood: Does not express any signs of distress or concerns. LCSW to follow for evaluation.  4. Neuropsych: This patient is capable of making decisions  on his own behalf.  5. DM type 2 controlled: Cont current regimen, titrate as indicated.    6. Anemia , guaic - x 1 neg, Hgb 11.4 remains with mild tachy, monitor 7.  Rib pain, resolved, Xrays showing atelectatic changes no signs or symptoms of PNA    LOS (Days) 21 A FACE TO FACE EVALUATION WAS PERFORMED  Khamora Karan E 10/04/2013, 7:07 AM

## 2013-10-04 NOTE — Progress Notes (Addendum)
NUTRITION FOLLOW UP  Intervention:   Pt has met all intake goals consistently and is able to maintain his nutrition status as well as weight.  No interventions required at this time. Will d/c protein supplement.  RD signing off.  Please consult if needed.   Nutrition Dx:  Increased nutrient needs related to infected foot wound as evidenced by estimated nutrition needs.   Goal:  Intake to meet >90% of estimated nutrition needs to support wound healing; optimal glycemic control to support healing.   Monitor:  PO intake, labs, weight trend, education needs  Assessment:   Patient with history of hepatitis; presented to the hospital on 2/1 with foot swelling since last week. Patient became incontinent of urine, feeling lightheaded, blurred vision, patient states that he started to loose balance as well. X-ray on admission showed gas in the soft tissue. Underwent I&D with 4th and 5th amputation to L foot as well as placement of VAC. 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 Noted to refuse DM education by nursing staff during acute hospitalization.   Educated by RD staff on 2/4 during acute hospitalization - pt was given handouts and was noted to be feeling very overwhelmed with information. Confirmed with patient that he was eating well during acute hospitalization.   Pt continues to eat well at 100% of his meals. No immediate nutrition related concerns or needs.  Pt has maintained his weight and is eating well x 1 month.  Moderate protein calorie malnutrition is likely resolved and pt is planning to transition to more stable housing which will support his intake.    Height: Ht Readings from Last 1 Encounters:  09/13/13 $RemoveB'5\' 3"'EZJCpcLW$  (1.6 m)    Weight Status:   Wt Readings from Last 1 Encounters:  10/02/13 147 lb 0.8 oz (66.7 kg)    Re-estimated needs:  Kcal: 2000  Protein: 95 - 110 gm  Fluid: 2 liters daily  Skin: non-pitting edema  Diet Order: Carb  Control   Intake/Output Summary (Last 24 hours) at 10/04/13 1002 Last data filed at 10/04/13 0800  Gross per 24 hour  Intake    600 ml  Output    950 ml  Net   -350 ml    Last BM: 3/5  Labs:  No results found for this basename: NA, K, CL, CO2, BUN, CREATININE, CALCIUM, MG, PHOS, GLUCOSE,  in the last 168 hours  CBG (last 3)   Recent Labs  10/03/13 1649 10/03/13 2056 10/04/13 0714  GLUCAP 199* 184* 161*    Scheduled Meds: . acetaminophen  650 mg Oral Once  . aspirin EC  325 mg Oral Daily  . enoxaparin (LOVENOX) injection  40 mg Subcutaneous Q24H  . ferrous sulfate  325 mg Oral BID WC  . hydrocerin  1 application Topical BID  . insulin aspart  0-20 Units Subcutaneous TID WC  . insulin aspart  0-5 Units Subcutaneous QHS  . insulin aspart protamine- aspart  15 Units Subcutaneous Q supper  . [START ON 10/05/2013] insulin aspart protamine- aspart  20 Units Subcutaneous Q breakfast  . metoprolol tartrate  12.5 mg Oral BID  . multivitamin with minerals  1 tablet Oral Daily  . protein supplement  1 scoop Oral TID WC    Continuous Infusions:   Brynda Greathouse, MS RD LDN Clinical Inpatient Dietitian Pager: [phone] Weekend/After hours pager: [phone]

## 2013-10-04 NOTE — Progress Notes (Signed)
Occupational Therapy Weekly Progress Note  Patient Details  Name: [name] MRN: [id number] Date of Birth: November 26, 1955  Today's Date: 10/04/2013 Time: 8638-1771 Time Calculation (min): 45 min  Patient has met 2 of 7 long term goals.  Short term goals not set due to pt awaiting either NHP or rest home. Pt continues to make progress with functional mobility, transfers, and overall activity tolerance/endurance.  Pt continues to require intermittent supervision with transfers and LB dressing due to decreased safety awareness and standing balance.  Pt aware of proper w/c positioning and parts management, however sometimes reports "I don't care" if brake not tight or leg rest in the way.  Continue to focus on activity tolerance/endurance and safety with transfers and mobility.  Patient continues to demonstrate the following deficits: LE and UE weakness, impaired activity tolerance/endurance, impaired balance and therefore will continue to benefit from skilled OT intervention to enhance overall performance with BADL.  See Patient's Care Plan for progression toward long term goals.  Patient progressing toward long term goals..  Plan of care revisions: upgraded LB dressing from Supervision to Mod I.  Skilled Therapeutic Interventions/Progress Updates:    Engaged in therapeutic exercise with focus on BUE strengthening, activity tolerance/endurance, and safety with transfers.  Pt seated EOB upon arrival, reports feeling increased SOB especially when talking.  Performed stand pivot bed > w/c > therapy mat with mod I this session (recalling need to remove leg rests).  Engaged in [place] with 3 reps of 10 on push up blocks, 3 reps of 10 chest presses, rows, and horizontal adduction/abduction with 5# dowel rod.  Intermittent monitoring of O2 and HR with O2 remaining in mid 90s throughout activity and HR climbing to 114 with above seated activity.  Rest breaks due to SOB and HR between each set.  Pt  returned to w/c mod I and propelled to room.  Therapy Documentation Precautions:  Precautions Precautions: Fall Precaution Comments:   Required Braces or Orthoses: Other Brace/Splint Other Brace/Splint: R DARCO shoe when OOB Restrictions Weight Bearing Restrictions: Yes LLE Weight Bearing: Non weight bearing Other Position/Activity Restrictions: keep L knee straight/elevated when in w/c, bed General:   Vital Signs: Therapy Vitals Pulse Rate: 101 BP: 118/75 mmHg Pain: Pain Assessment Pain Assessment: 0-10 Pain Score: 0-No pain  See FIM for current functional status  Therapy/Group: Individual Therapy  [name] 10/04/2013, 11:53 AM

## 2013-10-04 NOTE — Progress Notes (Signed)
Physical Therapy Weekly Progress Note  Patient Details  Name: [name] MRN: [id number] Date of Birth: 07-17-1956  Today's Date: 10/04/2013 Time: 3875-6433 Time Calculation (min): 55 min  Patient continues to make functional mobility progress and has met 4 of 7 long term goals.  Stair goal added last week for LE strengthening and community access.  Short term goals not set due to pt now awaiting NHP.  Pt is currently mod I for bed mobility, basic transfers, w/c mobility and supervision for standing balance, gait and min A for stair negotiation backwards.  Pt recently had sutures removed and has been fit for shrinker sock on LLE.      Patient continues to demonstrate the following deficits: LE and UE weakness, impaired activity tolerance/endurance, impaired balance, gait and therefore will continue to benefit from skilled PT intervention to enhance overall performance with activity tolerance, balance, postural control, ability to compensate for deficits and functional use of  right upper extremity, right lower extremity and left upper extremity.  See Patient's Care Plan for progression toward long term goals.  Patient progressing toward long term goals..  Continue plan of care.  Skilled Therapeutic Interventions/Progress Updates:   Pt continues to report improvement in rib pain but now reporting increased SOB and intermittent coughing; RN notified of respiratory symptoms.  Pt also reporting not taking mm relaxer today and feeling more stiff.  Reports having sutures removed and being fit for shrinker sock.  Transferred supine > sit, bed <> w/c <> mat squat pivot mod I, w/c mobility on unit mod I.  Transferred to mat and performed stretching in long sitting with lateral leans and reaching overhead for lateral trunk/rib and quadratus lumborum stretching x 2 each side; supine R and L hamstring stretches x 30 sec x 3 each LE, prone hip flexor stretch + 2 sets x 12 reps each hamstring curls and hip  extension R and LLE with rest breaks. Performed standing balance training with bilat UE support on w/c handles while performing lateral weight shifts with L knee on soft stool for minimal WB through L knee.  Pt able to tolerate ~30 seconds of PWB through L knee.  Returned to w/c and to room mod I.   Therapy Documentation Precautions:  Precautions Precautions: Fall Precaution Comments:   Required Braces or Orthoses: Other Brace/Splint Other Brace/Splint: R DARCO shoe when OOB Restrictions Weight Bearing Restrictions: Yes LLE Weight Bearing: Non weight bearing Other Position/Activity Restrictions: keep L knee straight/elevated when in w/c, bed Vital Signs: Therapy Vitals Temp: 99.6 F (37.6 C) Temp src: Oral Pulse Rate: 101 Resp: 20 BP: 118/75 mmHg Patient Position, if appropriate: Standing Oxygen Therapy SpO2: 94 % Pain: Pain Assessment Pain Assessment: 0-10 Pain Score: 0-No pain  See FIM for current functional status  Therapy/Group: Individual Therapy  Raylene Everts [facility] 10/04/2013, 8:50 AM

## 2013-10-04 NOTE — Progress Notes (Signed)
Pt reported to be short of breath during therapy, when writer queried patient about this he stated that the pain in his right side was less than yesterday and that the shortness of breath was no worse.  Breath sounds are diminished in bilateral lower lobes.

## 2013-10-05 ENCOUNTER — Inpatient Hospital Stay (HOSPITAL_COMMUNITY): Payer: Medicaid Other | Admitting: Physical Therapy

## 2013-10-05 ENCOUNTER — Inpatient Hospital Stay (HOSPITAL_COMMUNITY): Payer: Medicaid Other | Admitting: Occupational Therapy

## 2013-10-05 LAB — GLUCOSE, CAPILLARY
GLUCOSE-CAPILLARY: 139 mg/dL — AB (ref 70–99)
Glucose-Capillary: 176 mg/dL — ABNORMAL HIGH (ref 70–99)
Glucose-Capillary: 227 mg/dL — ABNORMAL HIGH (ref 70–99)
Glucose-Capillary: 187 mg/dL — ABNORMAL HIGH (ref 70–99)

## 2013-10-05 LAB — OCCULT BLOOD X 1 CARD TO LAB, STOOL: FECAL OCCULT BLD: NEGATIVE

## 2013-10-05 NOTE — Progress Notes (Signed)
Patient ID: [name], male   DOB: 1955/12/06, 58 y.o.   MRN: [id number] Subjective/Complaints: Right low and posterior rib pain, resloved No cough or SOB No R foot pain  No lower extremity pain no phantom sensation   Review of Systems - no pains, no nausea or vomiting  Objective: Vital Signs: Blood pressure 121/79, pulse 95, temperature 98.9 F (37.2 C), temperature source Oral, resp. rate 18, height 5\' 3"  (1.6 m), weight 66.7 kg (147 lb 0.8 oz), SpO2 95.00%. No results found. Results for orders placed during the hospital encounter of 09/13/13 (from the past 72 hour(s))  GLUCOSE, CAPILLARY     Status: Abnormal   Collection Time    10/02/13  7:35 AM      Result Value Ref Range   Glucose-Capillary 132 (*) 70 - 99 mg/dL   Comment 1 Notify RN    GLUCOSE, CAPILLARY     Status: Abnormal   Collection Time    10/02/13 11:27 AM      Result Value Ref Range   Glucose-Capillary 113 (*) 70 - 99 mg/dL   Comment 1 Notify RN    GLUCOSE, CAPILLARY     Status: Abnormal   Collection Time    10/02/13  4:47 PM      Result Value Ref Range   Glucose-Capillary 176 (*) 70 - 99 mg/dL   Comment 1 Notify RN    GLUCOSE, CAPILLARY     Status: Abnormal   Collection Time    10/02/13  8:19 PM      Result Value Ref Range   Glucose-Capillary 182 (*) 70 - 99 mg/dL   Comment 1 Notify RN    GLUCOSE, CAPILLARY     Status: Abnormal   Collection Time    10/03/13  7:32 AM      Result Value Ref Range   Glucose-Capillary 153 (*) 70 - 99 mg/dL  GLUCOSE, CAPILLARY     Status: Abnormal   Collection Time    10/03/13 11:38 AM      Result Value Ref Range   Glucose-Capillary 174 (*) 70 - 99 mg/dL  GLUCOSE, CAPILLARY     Status: Abnormal   Collection Time    10/03/13  4:49 PM      Result Value Ref Range   Glucose-Capillary 199 (*) 70 - 99 mg/dL  GLUCOSE, CAPILLARY     Status: Abnormal   Collection Time    10/03/13  8:56 PM      Result Value Ref Range   Glucose-Capillary 184 (*) 70 - 99 mg/dL  GLUCOSE,  CAPILLARY     Status: Abnormal   Collection Time    10/04/13  7:14 AM      Result Value Ref Range   Glucose-Capillary 161 (*) 70 - 99 mg/dL   Comment 1 Notify RN    GLUCOSE, CAPILLARY     Status: Abnormal   Collection Time    10/04/13 11:14 AM      Result Value Ref Range   Glucose-Capillary 117 (*) 70 - 99 mg/dL   Comment 1 Notify RN    GLUCOSE, CAPILLARY     Status: Abnormal   Collection Time    10/04/13  5:09 PM      Result Value Ref Range   Glucose-Capillary 196 (*) 70 - 99 mg/dL  GLUCOSE, CAPILLARY     Status: Abnormal   Collection Time    10/04/13  9:35 PM      Result Value Ref Range   Glucose-Capillary 130 (*)  70 - 99 mg/dL      HENT: dentition poor  Head: Normocephalic.  Eyes: EOM are normal.  Neck: Normal range of motion. Neck supple. No thyromegaly present.  Cardiovascular: Normal rate and regular rhythm. No murmurs  Respiratory: Effort normal and breath sounds normal. No respiratory distress.  GI: Soft. Bowel sounds are normal. He exhibits no distension.   Shoulder , elbow, wrist And hand ROM normal  Neurological: He is alert and oriented to person, place, and time.  5/5 strength Bilateral delt, bi, tri, grip,  4/5 Bilateral HF and R KE  3- R ankle DF/PF  Ext no calf swelling or tenderness  Assessment/Plan: 1. Functional deficits secondary to Left BKA for gangrene (diabetic neuropathy, undiagnosed DM, no medical care) which require 3+ hours per day of interdisciplinary therapy in a comprehensive inpatient rehab setting. Physiatrist is providing close team supervision and 24 hour management of active medical problems listed below. Physiatrist and rehab team continue to assess barriers to discharge/monitor patient progress toward functional and medical goals.  FIM: FIM - Bathing Bathing Steps Patient Completed: Chest;Right Arm;Left Arm;Abdomen;Front perineal area;Buttocks;Right upper leg;Left upper leg Bathing: 4: Min-Patient completes 8-9 21f 10 parts or 75+  percent  FIM - Upper Body Dressing/Undressing Upper body dressing/undressing: 0: Wears gown/pajamas-no public clothing FIM - Lower Body Dressing/Undressing Lower body dressing/undressing steps patient completed: Thread/unthread right pants leg;Thread/unthread left pants leg;Pull pants up/down;Don/Doff right shoe;Fasten/unfasten right shoe Lower body dressing/undressing: 5: Supervision: Safety issues/verbal cues  FIM - Toileting Toileting steps completed by patient: Adjust clothing prior to toileting;Performs perineal hygiene;Adjust clothing after toileting Toileting Assistive Devices: Grab bar or rail for support Toileting: 7: Independent: No helper, no device  FIM - Radio producer Devices: Grab bars Toilet Transfers: 5-From toilet/BSC: Supervision (verbal cues/safety issues);5-To toilet/BSC: Supervision (verbal cues/safety issues)  FIM - Engineer, site Assistive Devices: Arm rests Bed/Chair Transfer: 6: Supine > Sit: No assist;6: Sit > Supine: No assist;6: Bed > Chair or W/C: No assist;6: Chair or W/C > Bed: No assist  FIM - Locomotion: Wheelchair Distance: 150 Locomotion: Wheelchair: 6: Travels 150 ft or more, turns around, maneuvers to table, bed or toilet, negotiates 3% grade: maneuvers on rugs and over door sills independently FIM - Locomotion: Ambulation Locomotion: Ambulation Assistive Devices: Administrator Ambulation/Gait Assistance: 5: Supervision Locomotion: Ambulation: 0: Activity did not occur  Comprehension Comprehension Mode: Auditory Comprehension: 6-Follows complex conversation/direction: With extra time/assistive device  Expression Expression Mode: Verbal Expression: 7-Expresses complex ideas: With no assist  Social Interaction Social Interaction: 7-Interacts appropriately with others - No medications needed.  Problem Solving Problem Solving: 6-Solves complex problems: With extra time  Memory Memory:  6-Assistive device: No helper  Medical Problem List and Plan:  Left BKA, and R necrotic toes  1. DVT Prophylaxis/Anticoagulation:   SQ lovenox.  2. Pain Management: Prn oxcycodone.  3. Mood: Does not express any signs of distress or concerns. LCSW to follow for evaluation.  4. Neuropsych: This patient is capable of making decisions on his own behalf.  5. DM type 2 controlled: Cont current regimen, titrate as indicated.    6. Anemia , guaic - x 1 neg, Hgb 11.4 remains with mild tachy, monitor 7.  Rib pain, resolved, Xrays showing atelectatic changes no signs or symptoms of PNA 8.  Gangrene toes, great toe demarcating, auto amputation expected   LOS (Days) 22 A FACE TO FACE EVALUATION WAS PERFORMED  KIRSTEINS,ANDREW E 10/05/2013, 6:32 AM

## 2013-10-05 NOTE — Progress Notes (Signed)
Physical Therapy Session Note  Patient Details  Name: [name] MRN: [id number] Date of Birth: [birth year]  Today's Date: 10/05/2013 Time: 1600-1630 Time Calculation (min): 30 min  Therapy Documentation Precautions:  Precautions Precautions: Fall Precaution Comments:   Required Braces or Orthoses: Other Brace/Splint Other Brace/Splint: R DARCO shoe when OOB Restrictions Weight Bearing Restrictions: Yes LLE Weight Bearing: Non weight bearing Other Position/Activity Restrictions: keep L knee straight/elevated when in w/c, bed General: Amount of Missed PT Time (min): 30 Minutes Missed Time Reason: Unavailable (comment) (patient awaiting L stump shrinker and willing to participate when arrived.) Pain: patient reports of unpredictable phantom sensations and pains into L LE (BKA)  Therapeutic Activity:(15') Patient encouraged and assisted with min-assist to tighten straps on R DARCO shoe prior to ambulation. Transfer training sit<->stand is Mod-Independent using RW. Gait Training:(15') Ambulated/hopped using RW x 250' (R LE with Darco forefoot off-loading shoe intact). Patient took a couple of standing rests during this distance and there was no LOB requiring manual assistance but did have 1x of unsteady balance which he was able to self-correct.    See FIM for current functional status  Therapy/Group: Individual Therapy  Clearence Ped 10/05/2013, 7:48 PM

## 2013-10-05 NOTE — Progress Notes (Signed)
Occupational Therapy Session Note  Patient Details  Name: [name] MRN: [id number] Date of Birth: 1956-05-15  Today's Date: 10/05/2013 Time: [id number] Time Calculation (min): 43 min  Short Term Goals: No short term goals set  Skilled Therapeutic Interventions/Progress Updates:    Focus of session on self-care retraining, standing balance, and overall BUE strengthening and endurance activities.  Pt received seated EOB, donned Darco shoe EOB with cues to fasten tighter with pt reporting "it doesn't really matter".  Attempted to educate on importance of appropriate fit with pt with apparent disinterest.  Pt with c/o how residual limb was wrapped, re-wrapped and educated on limb wrapping which pt reports due to ordering of shrinker he is not interested in learning limb wrapping.  Engaged in activity in standing with focus on decreased reliance on UE support with completing bimanaul task.  Pt required seated rest break after ~3 mins each time.  Completed BUE exercises in unsupported sitting with weighted dowel rod with additional resistance provided by therapist with theraband.  Pt returned to w/c with stand pivot supervision and returned to room propelling w/c mod I.  Therapy Documentation Precautions:  Precautions Precautions: Fall Precaution Comments:   Required Braces or Orthoses: Other Brace/Splint Other Brace/Splint: R DARCO shoe when OOB Restrictions Weight Bearing Restrictions: Yes LLE Weight Bearing: Non weight bearing Other Position/Activity Restrictions: keep L knee straight/elevated when in w/c, bed General:   Vital Signs: Therapy Vitals Temp: 99.6 F (37.6 C) Temp src: Oral Pulse Rate: 94 Resp: 18 BP: 115/75 mmHg Patient Position, if appropriate: Lying Oxygen Therapy SpO2: 94 % O2 Device: None (Room air) Pain:  Pt with no c/o pain  See FIM for current functional status  Therapy/Group: Individual Therapy  [name] 10/05/2013, 3:49 PM

## 2013-10-06 LAB — GLUCOSE, CAPILLARY
GLUCOSE-CAPILLARY: 182 mg/dL — AB (ref 70–99)
Glucose-Capillary: 219 mg/dL — ABNORMAL HIGH (ref 70–99)
Glucose-Capillary: 174 mg/dL — ABNORMAL HIGH (ref 70–99)
Glucose-Capillary: 109 mg/dL — ABNORMAL HIGH (ref 70–99)

## 2013-10-06 NOTE — Progress Notes (Signed)
Patient ID: [name], male   DOB: 05/30/1956, 58 y.o.   MRN: [id number] Subjective/Complaints: Has stump shrinker, feels better No cough or SOB No R foot pain  No lower extremity pain no phantom sensation   Review of Systems - no pains, no nausea or vomiting  Objective: Vital Signs: Blood pressure 124/70, pulse 114, temperature 99.1 F (37.3 C), temperature source Oral, resp. rate 20, height 5\' 3"  (1.6 m), weight 66.7 kg (147 lb 0.8 oz), SpO2 92.00%. No results found. Results for orders placed during the hospital encounter of 09/13/13 (from the past 72 hour(s))  GLUCOSE, CAPILLARY     Status: Abnormal   Collection Time    10/03/13  7:32 AM      Result Value Ref Range   Glucose-Capillary 153 (*) 70 - 99 mg/dL  GLUCOSE, CAPILLARY     Status: Abnormal   Collection Time    10/03/13 11:38 AM      Result Value Ref Range   Glucose-Capillary 174 (*) 70 - 99 mg/dL  GLUCOSE, CAPILLARY     Status: Abnormal   Collection Time    10/03/13  4:49 PM      Result Value Ref Range   Glucose-Capillary 199 (*) 70 - 99 mg/dL  GLUCOSE, CAPILLARY     Status: Abnormal   Collection Time    10/03/13  8:56 PM      Result Value Ref Range   Glucose-Capillary 184 (*) 70 - 99 mg/dL  GLUCOSE, CAPILLARY     Status: Abnormal   Collection Time    10/04/13  7:14 AM      Result Value Ref Range   Glucose-Capillary 161 (*) 70 - 99 mg/dL   Comment 1 Notify RN    GLUCOSE, CAPILLARY     Status: Abnormal   Collection Time    10/04/13 11:14 AM      Result Value Ref Range   Glucose-Capillary 117 (*) 70 - 99 mg/dL   Comment 1 Notify RN    GLUCOSE, CAPILLARY     Status: Abnormal   Collection Time    10/04/13  5:09 PM      Result Value Ref Range   Glucose-Capillary 196 (*) 70 - 99 mg/dL  GLUCOSE, CAPILLARY     Status: Abnormal   Collection Time    10/04/13  9:35 PM      Result Value Ref Range   Glucose-Capillary 130 (*) 70 - 99 mg/dL  GLUCOSE, CAPILLARY     Status: Abnormal   Collection Time    10/05/13   7:42 AM      Result Value Ref Range   Glucose-Capillary 176 (*) 70 - 99 mg/dL  GLUCOSE, CAPILLARY     Status: Abnormal   Collection Time    10/05/13 11:06 AM      Result Value Ref Range   Glucose-Capillary 227 (*) 70 - 99 mg/dL  OCCULT BLOOD X 1 CARD TO LAB, STOOL     Status: None   Collection Time    10/05/13  1:17 PM      Result Value Ref Range   Fecal Occult Bld NEGATIVE  NEGATIVE  GLUCOSE, CAPILLARY     Status: Abnormal   Collection Time    10/05/13  4:00 PM      Result Value Ref Range   Glucose-Capillary 187 (*) 70 - 99 mg/dL  GLUCOSE, CAPILLARY     Status: Abnormal   Collection Time    10/05/13  8:47 PM  Result Value Ref Range   Glucose-Capillary 139 (*) 70 - 99 mg/dL      HENT: dentition poor  Head: Normocephalic.  Eyes: EOM are normal.  Neck: Normal range of motion. Neck supple. No thyromegaly present.  Cardiovascular: Normal rate and regular rhythm. No murmurs  Respiratory: Effort normal and breath sounds normal. No respiratory distress.  GI: Soft. Bowel sounds are normal. He exhibits no distension.   Shoulder , elbow, wrist And hand ROM normal  Neurological: He is alert and oriented to person, place, and time.  5/5 strength Bilateral delt, bi, tri, grip,  4/5 Bilateral HF and R KE  3- R ankle DF/PF Left stump no drainage R toes 1,4,5 dry gangrene Ext no calf swelling or tenderness  Assessment/Plan: 1. Functional deficits secondary to Left BKA for gangrene (diabetic neuropathy, undiagnosed DM, no medical care) which require 3+ hours per day of interdisciplinary therapy in a comprehensive inpatient rehab setting. Physiatrist is providing close team supervision and 24 hour management of active medical problems listed below. Physiatrist and rehab team continue to assess barriers to discharge/monitor patient progress toward functional and medical goals.  FIM: FIM - Bathing Bathing Steps Patient Completed: Chest;Right Arm;Left Arm;Abdomen;Front perineal  area;Buttocks;Right upper leg;Left upper leg Bathing: 4: Min-Patient completes 8-9 97f 10 parts or 75+ percent  FIM - Upper Body Dressing/Undressing Upper body dressing/undressing: 0: Wears gown/pajamas-no public clothing FIM - Lower Body Dressing/Undressing Lower body dressing/undressing steps patient completed: Thread/unthread right pants leg;Thread/unthread left pants leg;Pull pants up/down;Don/Doff right shoe;Fasten/unfasten right shoe Lower body dressing/undressing: 5: Supervision: Safety issues/verbal cues  FIM - Toileting Toileting steps completed by patient: Adjust clothing prior to toileting;Performs perineal hygiene;Adjust clothing after toileting Toileting Assistive Devices: Grab bar or rail for support Toileting: 0: Activity did not occur  FIM - Radio producer Devices: Grab bars Toilet Transfers: 5-From toilet/BSC: Supervision (verbal cues/safety issues);5-To toilet/BSC: Supervision (verbal cues/safety issues)  FIM - Engineer, site Assistive Devices: Arm rests Bed/Chair Transfer: 6: Supine > Sit: No assist;6: Sit > Supine: No assist;6: Bed > Chair or W/C: No assist;6: Chair or W/C > Bed: No assist  FIM - Locomotion: Wheelchair Distance: 150 Locomotion: Wheelchair: 6: Travels 150 ft or more, turns around, maneuvers to table, bed or toilet, negotiates 3% grade: maneuvers on rugs and over door sills independently FIM - Locomotion: Ambulation Locomotion: Ambulation Assistive Devices: Administrator Ambulation/Gait Assistance: 5: Supervision Locomotion: Ambulation: 0: Activity did not occur  Comprehension Comprehension Mode: Auditory Comprehension: 6-Follows complex conversation/direction: With extra time/assistive device  Expression Expression Mode: Verbal Expression: 7-Expresses complex ideas: With no assist  Social Interaction Social Interaction: 7-Interacts appropriately with others - No medications  needed.  Problem Solving Problem Solving: 6-Solves complex problems: With extra time  Memory Memory: 6-Assistive device: No helper  Medical Problem List and Plan:  Left BKA, and R necrotic toes  1. DVT Prophylaxis/Anticoagulation:   SQ lovenox.  2. Pain Management: Prn oxcycodone.  3. Mood: Does not express any signs of distress or concerns. LCSW to follow for evaluation.  4. Neuropsych: This patient is capable of making decisions on his own behalf.  5. DM type 2 controlled: Cont current regimen, titrate as indicated.    6. Anemia , guaic - x 1 neg, Hgb 11.4 remains with mild tachy, monitor 7.  Rib pain, resolved, Xrays showing atelectatic changes no signs or symptoms of PNA 8.  Gangrene toes, great toe demarcating, auto amputation expected   LOS (Days) 23 A FACE TO  FACE EVALUATION WAS PERFORMED  Strummer Canipe E 10/06/2013, 7:09 AM

## 2013-10-07 ENCOUNTER — Inpatient Hospital Stay (HOSPITAL_COMMUNITY): Payer: Medicaid Other | Admitting: Occupational Therapy

## 2013-10-07 ENCOUNTER — Inpatient Hospital Stay (HOSPITAL_COMMUNITY): Payer: Medicaid Other | Admitting: Physical Therapy

## 2013-10-07 LAB — GLUCOSE, CAPILLARY
GLUCOSE-CAPILLARY: 144 mg/dL — AB (ref 70–99)
Glucose-Capillary: 155 mg/dL — ABNORMAL HIGH (ref 70–99)
Glucose-Capillary: 130 mg/dL — ABNORMAL HIGH (ref 70–99)
Glucose-Capillary: 149 mg/dL — ABNORMAL HIGH (ref 70–99)

## 2013-10-07 MED ORDER — INSULIN ASPART PROT & ASPART (70-30 MIX) 100 UNIT/ML ~~LOC~~ SUSP
20.0000 [IU] | Freq: Every day | SUBCUTANEOUS | Status: DC
  Administered 2013-10-07 – 2013-10-21 (×15): 20 [IU] via SUBCUTANEOUS
  Filled 2013-10-07: qty 10

## 2013-10-07 NOTE — Progress Notes (Signed)
Patient ID: [name], male   DOB: 1956-01-05, 58 y.o.   MRN: [id number] Subjective/Complaints: Has stump shrinker, feels better No cough or SOB No R foot pain  Good night   Review of Systems - no pains, no nausea or vomiting  Objective: Vital Signs: Blood pressure 106/71, pulse 104, temperature 98.1 F (36.7 C), temperature source Oral, resp. rate 19, height 5\' 3"  (1.6 m), weight 66.7 kg (147 lb 0.8 oz), SpO2 94.00%. No results found. Results for orders placed during the hospital encounter of 09/13/13 (from the past 72 hour(s))  GLUCOSE, CAPILLARY     Status: Abnormal   Collection Time    10/04/13  7:14 AM      Result Value Ref Range   Glucose-Capillary 161 (*) 70 - 99 mg/dL   Comment 1 Notify RN    GLUCOSE, CAPILLARY     Status: Abnormal   Collection Time    10/04/13 11:14 AM      Result Value Ref Range   Glucose-Capillary 117 (*) 70 - 99 mg/dL   Comment 1 Notify RN    GLUCOSE, CAPILLARY     Status: Abnormal   Collection Time    10/04/13  5:09 PM      Result Value Ref Range   Glucose-Capillary 196 (*) 70 - 99 mg/dL  GLUCOSE, CAPILLARY     Status: Abnormal   Collection Time    10/04/13  9:35 PM      Result Value Ref Range   Glucose-Capillary 130 (*) 70 - 99 mg/dL  GLUCOSE, CAPILLARY     Status: Abnormal   Collection Time    10/05/13  7:42 AM      Result Value Ref Range   Glucose-Capillary 176 (*) 70 - 99 mg/dL  GLUCOSE, CAPILLARY     Status: Abnormal   Collection Time    10/05/13 11:06 AM      Result Value Ref Range   Glucose-Capillary 227 (*) 70 - 99 mg/dL  OCCULT BLOOD X 1 CARD TO LAB, STOOL     Status: None   Collection Time    10/05/13  1:17 PM      Result Value Ref Range   Fecal Occult Bld NEGATIVE  NEGATIVE  GLUCOSE, CAPILLARY     Status: Abnormal   Collection Time    10/05/13  4:00 PM      Result Value Ref Range   Glucose-Capillary 187 (*) 70 - 99 mg/dL  GLUCOSE, CAPILLARY     Status: Abnormal   Collection Time    10/05/13  8:47 PM      Result  Value Ref Range   Glucose-Capillary 139 (*) 70 - 99 mg/dL  GLUCOSE, CAPILLARY     Status: Abnormal   Collection Time    10/06/13  7:29 AM      Result Value Ref Range   Glucose-Capillary 182 (*) 70 - 99 mg/dL  GLUCOSE, CAPILLARY     Status: Abnormal   Collection Time    10/06/13 11:25 AM      Result Value Ref Range   Glucose-Capillary 219 (*) 70 - 99 mg/dL  GLUCOSE, CAPILLARY     Status: Abnormal   Collection Time    10/06/13  4:13 PM      Result Value Ref Range   Glucose-Capillary 174 (*) 70 - 99 mg/dL  GLUCOSE, CAPILLARY     Status: Abnormal   Collection Time    10/06/13  9:11 PM      Result Value Ref Range  Glucose-Capillary 109 (*) 70 - 99 mg/dL      HENT: dentition poor  Head: Normocephalic.  Eyes: EOM are normal.  Neck: Normal range of motion. Neck supple. No thyromegaly present.  Cardiovascular: Normal rate and regular rhythm. No murmurs  Respiratory: Effort normal and breath sounds normal. No respiratory distress.  GI: Soft. Bowel sounds are normal. He exhibits no distension.   Shoulder , elbow, wrist And hand ROM normal  Neurological: He is alert and oriented to person, place, and time.  5/5 strength Bilateral delt, bi, tri, grip,  4/5 Bilateral HF and R KE  3- R ankle DF/PF Left stump no drainage R toes 1,4,5 dry gangrene Ext no calf swelling or tenderness  Assessment/Plan: 1. Functional deficits secondary to Left BKA for gangrene (diabetic neuropathy, undiagnosed DM, no medical care) which require 3+ hours per day of interdisciplinary therapy in a comprehensive inpatient rehab setting. Physiatrist is providing close team supervision and 24 hour management of active medical problems listed below. Physiatrist and rehab team continue to assess barriers to discharge/monitor patient progress toward functional and medical goals.  FIM: FIM - Bathing Bathing Steps Patient Completed: Chest;Right Arm;Left Arm;Abdomen;Front perineal area;Buttocks;Right upper leg;Left  upper leg Bathing: 4: Min-Patient completes 8-9 34f 10 parts or 75+ percent  FIM - Upper Body Dressing/Undressing Upper body dressing/undressing: 0: Wears gown/pajamas-no public clothing FIM - Lower Body Dressing/Undressing Lower body dressing/undressing steps patient completed: Thread/unthread right pants leg;Thread/unthread left pants leg;Pull pants up/down;Don/Doff right shoe;Fasten/unfasten right shoe Lower body dressing/undressing: 5: Supervision: Safety issues/verbal cues  FIM - Toileting Toileting steps completed by patient: Adjust clothing prior to toileting;Performs perineal hygiene;Adjust clothing after toileting Toileting Assistive Devices: Grab bar or rail for support Toileting: 5: Supervision: Safety issues/verbal cues  FIM - Radio producer Devices: Grab bars Toilet Transfers: 5-From toilet/BSC: Supervision (verbal cues/safety issues);5-To toilet/BSC: Supervision (verbal cues/safety issues)  FIM - Engineer, site Assistive Devices: Arm rests Bed/Chair Transfer: 6: Supine > Sit: No assist;6: Sit > Supine: No assist;6: Bed > Chair or W/C: No assist;6: Chair or W/C > Bed: No assist  FIM - Locomotion: Wheelchair Distance: 150 Locomotion: Wheelchair: 6: Travels 150 ft or more, turns around, maneuvers to table, bed or toilet, negotiates 3% grade: maneuvers on rugs and over door sills independently FIM - Locomotion: Ambulation Locomotion: Ambulation Assistive Devices: Administrator Ambulation/Gait Assistance: 5: Supervision Locomotion: Ambulation: 0: Activity did not occur  Comprehension Comprehension Mode: Auditory Comprehension: 6-Follows complex conversation/direction: With extra time/assistive device  Expression Expression Mode: Verbal Expression: 7-Expresses complex ideas: With no assist  Social Interaction Social Interaction: 7-Interacts appropriately with others - No medications needed.  Problem  Solving Problem Solving: 6-Solves complex problems: With extra time  Memory Memory: 6-Assistive device: No helper  Medical Problem List and Plan:  Left BKA, and R necrotic toes  1. DVT Prophylaxis/Anticoagulation:   SQ lovenox.  2. Pain Management: Prn oxcycodone.  3. Mood: Does not express any signs of distress or concerns. LCSW to follow for evaluation.  4. Neuropsych: This patient is capable of making decisions on his own behalf.  5. DM type 2 controlled: Cont current regimen, titrate as indicated.    6. Anemia , guaic - x 1 neg, Hgb 11.4 remains with mild tachy, monitor 7.  Rib pain, resolved, Xrays showing atelectatic changes no signs or symptoms of PNA 8.  Gangrene toes, great toe demarcating, auto amputation expected   LOS (Days) 24 A FACE TO FACE EVALUATION WAS PERFORMED  Alysia Penna  E 10/07/2013, 6:47 AM

## 2013-10-07 NOTE — Progress Notes (Signed)
Physical Therapy Session Note  Patient Details  Name: [name] MRN: [id number] Date of Birth: October 17, 1955  Today's Date: 10/07/2013 Time: 0905-1002 Time Calculation (min): 57 min  Short Term Goals: No short term goals set  Skilled Therapeutic Interventions/Progress Updates:   Pt now with shrinker sock with improved comfort.  Pt sat EOB without back support and was able to maintain dynamic sitting balance while doffing dirty pants and donning underwear and clean pants mod I.  Pt also able to stand with alternating UE support on RW to pull pants and underwear up.  Performed gait x 125' with RW and supervision with intermittent standing rest breaks secondary to LE fatigue and sensitivity of R foot in shoe.  While resting on mat pt demonstrated independent ability to doff and don shrinker without wrinkles.  Performed stair negotiation up/down 6 stairs (4") with bilat rails forwards with min A.  Performed standing balance training with PWB through L knee on stool with bilat UE support on RW during lateral weight shifting and maintaining equal WB through bilat LE while releasing UE support from RW x 10 seconds at a time.  Returned to room in w/c mod I.    Therapy Documentation Precautions:  Precautions Precautions: Fall Precaution Comments:   Required Braces or Orthoses: Other Brace/Splint Other Brace/Splint: R DARCO shoe when OOB Restrictions Weight Bearing Restrictions: Yes LLE Weight Bearing: Non weight bearing Other Position/Activity Restrictions: keep L knee straight/elevated when in w/c, bed Vital Signs: Therapy Vitals Pulse Rate: 106 BP: 114/69 mmHg Oxygen Therapy SpO2: 95 % O2 Device: None (Room air) Pain: Pain Assessment Pain Assessment: No/denies pain Locomotion : Ambulation Ambulation/Gait Assistance: 5: Supervision Wheelchair Mobility Distance: 150   See FIM for current functional status  Therapy/Group: Individual Therapy  Raylene Everts [facility] 10/07/2013, 10:03 AM

## 2013-10-07 NOTE — Progress Notes (Signed)
Occupational Therapy Session Note  Patient Details  Name: [name] MRN: [id number] Date of Birth: 04-Nov-1955  Today's Date: 10/07/2013 Time: 1117-1200 Time Calculation (min): 43 min  Short Term Goals: No short term goals set  Skilled Therapeutic Interventions/Progress Updates:    Focus of session on functional mobility, transfers, and overall BUE strengthening and endurance activities.  Pt received in w/c.  Propelled w/c to ADL apt, engaged in toilet transfer and educated on tub/shower transfer.  Utilized RW to ambulate to toilet in bathroom performing toilet transfer supervision, approaching mod I.  Pt able to return demonstrate tub/shower transfer with use of tub bench with only initial demonstration and explanation with no additional cues.  Discussed donning and doffing shrinker and encouraged pt to inspect residual limb regularly.  In therapy gym engaged in BUE exercises in unsupported sitting with 4# medicine ball with trunk rotation and PNF pattern reaching with 2 reps of 15.  Pt mod I with w/c parts management for transfers.  Therapy Documentation Precautions:  Precautions Precautions: Fall Precaution Comments:   Required Braces or Orthoses: Other Brace/Splint Other Brace/Splint: R DARCO shoe when OOB Restrictions Weight Bearing Restrictions: Yes LLE Weight Bearing: Non weight bearing Other Position/Activity Restrictions: keep L knee straight/elevated when in w/c, bed General:   Vital Signs: Therapy Vitals Pulse Rate: 106 Oxygen Therapy SpO2: 95 % O2 Device: None (Room air) Pain: Pain Assessment Pain Assessment: No/denies pain  See FIM for current functional status  Therapy/Group: Individual Therapy  [name] 10/07/2013, 12:19 PM

## 2013-10-08 ENCOUNTER — Inpatient Hospital Stay (HOSPITAL_COMMUNITY): Payer: Medicaid Other | Admitting: Physical Therapy

## 2013-10-08 ENCOUNTER — Inpatient Hospital Stay (HOSPITAL_COMMUNITY): Payer: Medicaid Other | Admitting: Occupational Therapy

## 2013-10-08 LAB — GLUCOSE, CAPILLARY
GLUCOSE-CAPILLARY: 154 mg/dL — AB (ref 70–99)
GLUCOSE-CAPILLARY: 119 mg/dL — AB (ref 70–99)
GLUCOSE-CAPILLARY: 77 mg/dL (ref 70–99)
Glucose-Capillary: 209 mg/dL — ABNORMAL HIGH (ref 70–99)

## 2013-10-08 NOTE — Progress Notes (Signed)
Occupational Therapy Session Note  Patient Details  Name: [name] MRN: [id number] Date of Birth: Jan 11, 1956  Today's Date: 10/08/2013 Time: [id number] Time Calculation (min): 42 min  Short Term Goals: No short term goals set  Skilled Therapeutic Interventions/Progress Updates:    Pt seen for ADL retraining with focus on increased independence with transfers, w/c setup, standing balance during bathing and dressing.  Pt in bed upon arrival, donned Darco shoot mod I, stand pivot to w/c mod I.  Engaged in bathing and dressing at sink with pt donning/doffing shrinker and washing residual limb.  Pt declined removing Rt sock this session therefore unable to wash Rt foot or assess skin.  Pt overall mod I with self-care tasks at sit > stand level.  Therapy Documentation Precautions:  Precautions Precautions: Fall Precaution Comments:   Required Braces or Orthoses: Other Brace/Splint Other Brace/Splint: R DARCO shoe when OOB Restrictions Weight Bearing Restrictions: Yes LLE Weight Bearing: Non weight bearing Other Position/Activity Restrictions: keep L knee straight/elevated when in w/c, bed General:   Vital Signs: Therapy Vitals Pulse Rate: 101 BP: 124/74 mmHg Pain:  Pt with no c/o pain  See FIM for current functional status  Therapy/Group: Individual Therapy  [name] 10/08/2013, 10:01 AM

## 2013-10-08 NOTE — Progress Notes (Signed)
Physical Therapy Session Note  Patient Details  Name: [name] MRN: [id number] Date of Birth: 1956-02-25  Today's Date: 10/08/2013 Time: [id number] Time Calculation (min): 46 min  Short Term Goals: No short term goals set  Skilled Therapeutic Interventions/Progress Updates:   Pt resting in bed; performed mod I supine <> sit and bed <> w/c <> mat squat pivot mod I with mod I w/c set up and parts management.  Pt reporting feeling mm tightness anterior shoulder/chest.  Placed pt in supine at edge of mat with LE hanging off edge for bilat hip flexor stretching and with roll along spine for increased spinal extension and anterior chest stretch while performing isometric scapular retractions x 10 reps, green theraband resisted UE opposing flexion <> extension x 10 reps each side, and opposing UE shoulder horizontal ABD x 10 reps.  Returned to sitting secondary to discomfort in anterior hip and back from stretch.  Performed standing balance training with PWB through L knee on stool with towel under knee with focus on maintaining balance and COG in midline while releasing RW x 5 seconds, 7 seconds, 10 seconds at a time.  Also performed PWB through LLE during LLE forwards and retro advancement on stool; added gait sequence of progressing RW, LLE on stool and RLE forwards and retro x 7-8' each direction.  Returned to w/c and bed in room mod I.    Therapy Documentation Precautions:  Precautions Precautions: Fall Precaution Comments:   Required Braces or Orthoses: Other Brace/Splint Other Brace/Splint: R DARCO shoe when OOB Restrictions Weight Bearing Restrictions: Yes LLE Weight Bearing: Non weight bearing Other Position/Activity Restrictions: keep L knee straight/elevated when in w/c, bed Vital Signs: Therapy Vitals Temp: 98.9 F (37.2 C) Temp src: Oral Pulse Rate: 104 Resp: 26 BP: 114/75 mmHg Patient Position, if appropriate: Lying Oxygen Therapy SpO2: 95 % O2 Device: None (Room  air) Pain: Pain Assessment Pain Assessment: No/denies pain Locomotion : Wheelchair Mobility Distance: 150   See FIM for current functional status  Therapy/Group: Individual Therapy  Raylene Everts [facility] 10/08/2013, 3:53 PM

## 2013-10-08 NOTE — Progress Notes (Signed)
Patient ID: [name], male   DOB: [birth year], 58 y.o.   MRN: [id number] Subjective/Complaints: "can I shower R foot?"   Review of Systems - no pains, no nausea or vomiting  Objective: Vital Signs: Blood pressure 120/77, pulse 104, temperature 99.4 F (37.4 C), temperature source Oral, resp. rate 18, height 5\' 3"  (1.6 m), weight 66.7 kg (147 lb 0.8 oz), SpO2 98.00%. No results found. Results for orders placed during the hospital encounter of 09/13/13 (from the past 72 hour(s))  GLUCOSE, CAPILLARY     Status: Abnormal   Collection Time    10/05/13  7:42 AM      Result Value Ref Range   Glucose-Capillary 176 (*) 70 - 99 mg/dL  GLUCOSE, CAPILLARY     Status: Abnormal   Collection Time    10/05/13 11:06 AM      Result Value Ref Range   Glucose-Capillary 227 (*) 70 - 99 mg/dL  OCCULT BLOOD X 1 CARD TO LAB, STOOL     Status: None   Collection Time    10/05/13  1:17 PM      Result Value Ref Range   Fecal Occult Bld NEGATIVE  NEGATIVE  GLUCOSE, CAPILLARY     Status: Abnormal   Collection Time    10/05/13  4:00 PM      Result Value Ref Range   Glucose-Capillary 187 (*) 70 - 99 mg/dL  GLUCOSE, CAPILLARY     Status: Abnormal   Collection Time    10/05/13  8:47 PM      Result Value Ref Range   Glucose-Capillary 139 (*) 70 - 99 mg/dL  GLUCOSE, CAPILLARY     Status: Abnormal   Collection Time    10/06/13  7:29 AM      Result Value Ref Range   Glucose-Capillary 182 (*) 70 - 99 mg/dL  GLUCOSE, CAPILLARY     Status: Abnormal   Collection Time    10/06/13 11:25 AM      Result Value Ref Range   Glucose-Capillary 219 (*) 70 - 99 mg/dL  GLUCOSE, CAPILLARY     Status: Abnormal   Collection Time    10/06/13  4:13 PM      Result Value Ref Range   Glucose-Capillary 174 (*) 70 - 99 mg/dL  GLUCOSE, CAPILLARY     Status: Abnormal   Collection Time    10/06/13  9:11 PM      Result Value Ref Range   Glucose-Capillary 109 (*) 70 - 99 mg/dL  GLUCOSE, CAPILLARY     Status: Abnormal    Collection Time    10/07/13  7:23 AM      Result Value Ref Range   Glucose-Capillary 155 (*) 70 - 99 mg/dL  GLUCOSE, CAPILLARY     Status: Abnormal   Collection Time    10/07/13 11:55 AM      Result Value Ref Range   Glucose-Capillary 130 (*) 70 - 99 mg/dL  GLUCOSE, CAPILLARY     Status: Abnormal   Collection Time    10/07/13  4:33 PM      Result Value Ref Range   Glucose-Capillary 149 (*) 70 - 99 mg/dL  GLUCOSE, CAPILLARY     Status: Abnormal   Collection Time    10/07/13  8:44 PM      Result Value Ref Range   Glucose-Capillary 144 (*) 70 - 99 mg/dL      HENT: dentition poor  Head: Normocephalic.  Eyes: EOM are normal.  Neck:  Normal range of motion. Neck supple. No thyromegaly present.  Cardiovascular: Normal rate and regular rhythm. No murmurs  Respiratory: Effort normal and breath sounds normal. No respiratory distress.  GI: Soft. Bowel sounds are normal. He exhibits no distension.   Shoulder , elbow, wrist And hand ROM normal  Neurological: He is alert and oriented to person, place, and time.  5/5 strength Bilateral delt, bi, tri, grip,  4/5 Bilateral HF and R KE  3- R ankle DF/PF Left stump no drainage R toes 1,4,5 dry gangrene Ext no calf swelling or tenderness  Assessment/Plan: 1. Functional deficits secondary to Left BKA for gangrene (diabetic neuropathy, undiagnosed DM, no medical care) which require 3+ hours per day of interdisciplinary therapy in a comprehensive inpatient rehab setting. Physiatrist is providing close team supervision and 24 hour management of active medical problems listed below. Physiatrist and rehab team continue to assess barriers to discharge/monitor patient progress toward functional and medical goals.  FIM: FIM - Bathing Bathing Steps Patient Completed: Chest;Right Arm;Left Arm;Abdomen;Front perineal area;Buttocks;Right upper leg;Left upper leg Bathing: 4: Min-Patient completes 8-9 26f 10 parts or 75+ percent  FIM - Upper Body  Dressing/Undressing Upper body dressing/undressing: 0: Wears gown/pajamas-no public clothing FIM - Lower Body Dressing/Undressing Lower body dressing/undressing steps patient completed: Thread/unthread right pants leg;Thread/unthread left pants leg;Pull pants up/down;Don/Doff right shoe;Fasten/unfasten right shoe Lower body dressing/undressing: 5: Set-up assist to: Obtain clothing  FIM - Toileting Toileting steps completed by patient: Adjust clothing prior to toileting;Performs perineal hygiene;Adjust clothing after toileting Toileting Assistive Devices: Grab bar or rail for support Toileting: 5: Supervision: Safety issues/verbal cues  FIM - Radio producer Devices: Mining engineer Transfers: 5-From toilet/BSC: Supervision (verbal cues/safety issues);5-To toilet/BSC: Supervision (verbal cues/safety issues)  FIM - Engineer, site Assistive Devices: Arm rests Bed/Chair Transfer: 6: Supine > Sit: No assist;6: Sit > Supine: No assist;6: Bed > Chair or W/C: No assist;6: Chair or W/C > Bed: No assist  FIM - Locomotion: Wheelchair Distance: 150 Locomotion: Wheelchair: 6: Travels 150 ft or more, turns around, maneuvers to table, bed or toilet, negotiates 3% grade: maneuvers on rugs and over door sills independently FIM - Locomotion: Ambulation Locomotion: Ambulation Assistive Devices: Administrator Ambulation/Gait Assistance: 5: Supervision Locomotion: Ambulation: 2: Travels 50 - 149 ft with supervision/safety issues  Comprehension Comprehension Mode: Auditory Comprehension: 6-Follows complex conversation/direction: With extra time/assistive device  Expression Expression Mode: Verbal Expression: 7-Expresses complex ideas: With no assist  Social Interaction Social Interaction: 7-Interacts appropriately with others - No medications needed.  Problem Solving Problem Solving: 6-Solves complex problems: With extra  time  Memory Memory: 6-Assistive device: No helper  Medical Problem List and Plan:  Left BKA, and R necrotic toes  1. DVT Prophylaxis/Anticoagulation:   SQ lovenox.  2. Pain Management: Prn oxcycodone.  3. Mood: Does not express any signs of distress or concerns. LCSW to follow for evaluation.  4. Neuropsych: This patient is capable of making decisions on his own behalf.  5. DM type 2 controlled: Cont current regimen, titrate as indicated.    6. Anemia , guaic - x 1 neg, Hgb 11.4 remains with mild tachy, monitor 7.  Rib pain, resolved, Xrays showing atelectatic changes no signs or symptoms of PNA 8.  Gangrene toes, great toe demarcating, auto amputation expected, will keep dry during shower   LOS (Days) [place] EVALUATION WAS PERFORMED  KIRSTEINS,ANDREW E 10/08/2013, 6:44 AM

## 2013-10-09 ENCOUNTER — Inpatient Hospital Stay (HOSPITAL_COMMUNITY): Payer: Medicaid Other | Admitting: Occupational Therapy

## 2013-10-09 ENCOUNTER — Inpatient Hospital Stay (HOSPITAL_COMMUNITY): Payer: Medicaid Other

## 2013-10-09 LAB — GLUCOSE, CAPILLARY
GLUCOSE-CAPILLARY: 150 mg/dL — AB (ref 70–99)
GLUCOSE-CAPILLARY: 107 mg/dL — AB (ref 70–99)
Glucose-Capillary: 122 mg/dL — ABNORMAL HIGH (ref 70–99)
Glucose-Capillary: 142 mg/dL — ABNORMAL HIGH (ref 70–99)
Glucose-Capillary: 189 mg/dL — ABNORMAL HIGH (ref 70–99)

## 2013-10-09 NOTE — Progress Notes (Signed)
Patient ID: [name], male   DOB: 18-Aug-1955, 58 y.o.   MRN: [id number] Subjective/Complaints:  Cough resolved No back pain No R foot pain  Review of Systems - no pains, no nausea or vomiting  Objective: Vital Signs: Blood pressure 122/78, pulse 98, temperature 98.7 F (37.1 C), temperature source Oral, resp. rate 19, height 5\' 3"  (1.6 m), weight 64.8 kg (142 lb 13.7 oz), SpO2 97.00%. No results found. Results for orders placed during the hospital encounter of 09/13/13 (from the past 72 hour(s))  GLUCOSE, CAPILLARY     Status: Abnormal   Collection Time    10/06/13 11:25 AM      Result Value Ref Range   Glucose-Capillary 219 (*) 70 - 99 mg/dL  GLUCOSE, CAPILLARY     Status: Abnormal   Collection Time    10/06/13  4:13 PM      Result Value Ref Range   Glucose-Capillary 174 (*) 70 - 99 mg/dL  GLUCOSE, CAPILLARY     Status: Abnormal   Collection Time    10/06/13  9:11 PM      Result Value Ref Range   Glucose-Capillary 109 (*) 70 - 99 mg/dL  GLUCOSE, CAPILLARY     Status: Abnormal   Collection Time    10/07/13  7:23 AM      Result Value Ref Range   Glucose-Capillary 155 (*) 70 - 99 mg/dL  GLUCOSE, CAPILLARY     Status: Abnormal   Collection Time    10/07/13 11:55 AM      Result Value Ref Range   Glucose-Capillary 130 (*) 70 - 99 mg/dL  GLUCOSE, CAPILLARY     Status: Abnormal   Collection Time    10/07/13  4:33 PM      Result Value Ref Range   Glucose-Capillary 149 (*) 70 - 99 mg/dL  GLUCOSE, CAPILLARY     Status: Abnormal   Collection Time    10/07/13  8:44 PM      Result Value Ref Range   Glucose-Capillary 144 (*) 70 - 99 mg/dL  GLUCOSE, CAPILLARY     Status: Abnormal   Collection Time    10/08/13  7:22 AM      Result Value Ref Range   Glucose-Capillary 154 (*) 70 - 99 mg/dL   Comment 1 Notify RN    GLUCOSE, CAPILLARY     Status: Abnormal   Collection Time    10/08/13 11:28 AM      Result Value Ref Range   Glucose-Capillary 119 (*) 70 - 99 mg/dL   Comment 1  Notify RN    GLUCOSE, CAPILLARY     Status: Abnormal   Collection Time    10/08/13  4:59 PM      Result Value Ref Range   Glucose-Capillary 209 (*) 70 - 99 mg/dL   Comment 1 Notify RN    GLUCOSE, CAPILLARY     Status: None   Collection Time    10/08/13  9:07 PM      Result Value Ref Range   Glucose-Capillary 77  70 - 99 mg/dL  GLUCOSE, CAPILLARY     Status: Abnormal   Collection Time    10/09/13  2:43 AM      Result Value Ref Range   Glucose-Capillary 122 (*) 70 - 99 mg/dL      HENT: dentition poor  Head: Normocephalic.  Eyes: EOM are normal.  Neck: Normal range of motion. Neck supple. No thyromegaly present.  Cardiovascular: Normal rate and regular  rhythm. No murmurs  Respiratory: Effort normal and breath sounds normal. No respiratory distress.  GI: Soft. Bowel sounds are normal. He exhibits no distension.   Shoulder , elbow, wrist And hand ROM normal  Neurological: He is alert and oriented to person, place, and time.  5/5 strength Bilateral delt, bi, tri, grip,  4/5 Bilateral HF and R KE  3- R ankle DF/PF Left stump no drainage R toes 1,4,5 dry gangrene Ext no calf swelling or tenderness  Assessment/Plan: 1. Functional deficits secondary to Left BKA for gangrene (diabetic neuropathy, undiagnosed DM, no medical care) which require 3+ hours per day of interdisciplinary therapy in a comprehensive inpatient rehab setting. Physiatrist is providing close team supervision and 24 hour management of active medical problems listed below. Physiatrist and rehab team continue to assess barriers to discharge/monitor patient progress toward functional and medical goals.  FIM: FIM - Bathing Bathing Steps Patient Completed: Chest;Right Arm;Left Arm;Abdomen;Front perineal area;Buttocks;Right upper leg;Left upper leg Bathing: 4: Min-Patient completes 8-9 61f 10 parts or 75+ percent  FIM - Upper Body Dressing/Undressing Upper body dressing/undressing steps patient completed:  Thread/unthread right sleeve of pullover shirt/dresss;Thread/unthread left sleeve of pullover shirt/dress;Put head through opening of pull over shirt/dress;Pull shirt over trunk Upper body dressing/undressing: 7: Complete Independence: No helper FIM - Lower Body Dressing/Undressing Lower body dressing/undressing steps patient completed: Thread/unthread right pants leg;Thread/unthread left pants leg;Pull pants up/down;Don/Doff right shoe;Fasten/unfasten right shoe Lower body dressing/undressing: 6: More than reasonable amount of time  FIM - Toileting Toileting steps completed by patient: Adjust clothing prior to toileting;Performs perineal hygiene;Adjust clothing after toileting Toileting Assistive Devices: Grab bar or rail for support Toileting: 5: Supervision: Safety issues/verbal cues  FIM - Radio producer Devices: Mining engineer Transfers: 5-From toilet/BSC: Supervision (verbal cues/safety issues);5-To toilet/BSC: Supervision (verbal cues/safety issues)  FIM - Engineer, site Assistive Devices: Arm rests Bed/Chair Transfer: 6: Supine > Sit: No assist;6: Bed > Chair or W/C: No assist;6: Assistive device: no helper;6: Sit > Supine: No assist;6: Chair or W/C > Bed: No assist  FIM - Locomotion: Wheelchair Distance: 150 Locomotion: Wheelchair: 6: Travels 150 ft or more, turns around, maneuvers to table, bed or toilet, negotiates 3% grade: maneuvers on rugs and over door sills independently FIM - Locomotion: Ambulation Locomotion: Ambulation Assistive Devices: Administrator Ambulation/Gait Assistance: 5: Supervision Locomotion: Ambulation: 0: Activity did not occur  Comprehension Comprehension Mode: Auditory Comprehension: 6-Follows complex conversation/direction: With extra time/assistive device  Expression Expression Mode: Verbal Expression: 7-Expresses complex ideas: With no assist  Social Interaction Social  Interaction: 7-Interacts appropriately with others - No medications needed.  Problem Solving Problem Solving: 6-Solves complex problems: With extra time  Memory Memory: 6-Assistive device: No helper  Medical Problem List and Plan:  Left BKA, and R necrotic toes  1. DVT Prophylaxis/Anticoagulation:   SQ lovenox.  2. Pain Management: Prn oxcycodone.  3. Mood: Does not express any signs of distress or concerns. LCSW to follow for evaluation.  4. Neuropsych: This patient is capable of making decisions on his own behalf.  5. DM type 2 controlled: Cont current regimen, titrate as indicated.    6. Anemia , guaic - x 1 neg, Hgb 11.4 remains with mild tachy, monitor 7.  Rib pain, resolved, Xrays showing atelectatic changes no signs or symptoms of PNA 8.  Gangrene toes, great toe demarcating, auto amputation expected, will keep dry during shower   LOS (Days) [place] EVALUATION WAS PERFORMED  Alysia Penna E 10/09/2013,  7:41 AM

## 2013-10-09 NOTE — Progress Notes (Signed)
Occupational Therapy Session Note  Patient Details  Name: Ross Hambelton MRN: [id number] Date of Birth: 08-16-1955  Today's Date: 10/09/2013 Time: [id number] Time Calculation (min): 42 min  Short Term Goals: No short term goals set  Skilled Therapeutic Interventions/Progress Updates:    Engaged in therapeutic exercise with focus on BUE strengthening, activity tolerance/endurance, and safety with transfers.  Performed stand pivot bed > w/c > therapy mat with mod I this session (recalling need to remove leg rests). Engaged in [place] with 3 reps of 15 chest presses, rows, and horizontal adduction/abduction with 7# dowel rod.  Pt returned to bed at end of session with mod I.  Therapy Documentation Precautions:  Precautions Precautions: Fall Precaution Comments:   Required Braces or Orthoses: Other Brace/Splint Other Brace/Splint: R DARCO shoe when OOB Restrictions Weight Bearing Restrictions: Yes LLE Weight Bearing: Non weight bearing Other Position/Activity Restrictions: keep L knee straight/elevated when in w/c, bed Pain:  Pt with no c/o pain  See FIM for current functional status  Therapy/Group: Individual Therapy  [name] 10/09/2013, 11:46 AM

## 2013-10-09 NOTE — Patient Care Conference (Signed)
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 Gangrene of toe 09/16/2013  . Fever with chills 09/16/2013  . Unilateral complete BKA 09/13/2013  . DKA (diabetic ketoacidoses) 09/04/2013  . 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 DM (diabetes mellitus) type II controlled, neurological manifestation 09/01/2013    Expected Discharge Date:    Team Members Present: Physician leading conference: Dr. Alysia Penna Social Worker Present: Ovidio Kin, LCSW;Jenny Prevatt, LCSW Nurse Present: Dorthula Nettles, RN PT Present: Raylene Everts, PT;Emily Parcell, PT OT Present: [name], Lorelee Cover, OT PPS Coordinator present : Ileana Ladd, Lelan Pons, RN, CRRN     Current Status/Progress Goal Weekly Team Focus  Medical   sutures out, no sig pain  maintian med stability  monitor status, ortho to order prosthesis   Bowel/Bladder   Continent of Bowel and Bladder: LBM 3/10  Remain continent of bowel and blader  Maintain curernt regimen   Swallow/Nutrition/ Hydration     WFL        ADL's   min assist bathing, supervision-mod I transfers and dressing  upgraded to mod I overall  activity tolerance/endurance, standing blaance, RLE and LLE care   Mobility   Mod I transfers and w/c mobility, supervision gait and min A stairs  Transfer and w/c goals met, supervision gait, standing balance, stairs; awaiting NHP  Gait and standing balance, LE strengthening, stairs   Communication     [facility]         Safety/Cognition/ Behavioral Observations    No unsafe behaviors        Pain   Pt has no complaints of pain  Remain free of Pain      Skin   L BKA stump shrinker in place. No drainange noted. Right foot 1st, 2nd and 5th toe necrotic and cracking. Heel cracking  No new skin breakdown  Asses skin q shift and PRN for any new skin issues      *See Care Plan and progress notes for long and short-term goals.  Barriers to Discharge: limited post  D/C option    Possible Resolutions to Barriers:  cont SW efforts    Discharge Planning/Teaching Needs:  Continue to look for RH bed, no offers      Team Discussion:  No beds offers looking even further out-100 miles.  OT can have him shower now. QD now  Revisions to Treatment Plan:  NHP-QD    Continued Need for Acute Rehabilitation Level of Care: The patient requires daily medical management by a physician with specialized training in physical medicine and rehabilitation for the following conditions: Daily direction of a multidisciplinary physical rehabilitation program to ensure safe treatment while eliciting the highest outcome that is of practical value to the patient.: Yes Daily medical management of patient stability for increased activity during participation in an intensive rehabilitation regime.: Yes Daily analysis of laboratory values and/or radiology reports with any subsequent need for medication adjustment of medical intervention for : Neurological  problems;Other  Elease Hashimoto 10/10/2013, 8:43 AM

## 2013-10-09 NOTE — Progress Notes (Signed)
L BKA incision well healed. No signs of infection R toes have dry gangrene. No signs of acute complication Continue stump shrinker - will have [employer] start process of prosthetic fitting Continue to allow right toes to auto amputate Placement per primary team - appreciate assistance  N. Eduard Roux, MD [place] 8:04 AM

## 2013-10-09 NOTE — Progress Notes (Signed)
Physical Therapy Note  Patient Details  Name: [name] MRN: [id number] Date of Birth: 02/24/1956 Today's Date: 10/09/2013 [id number], 40 min individual; pt missed 10 min therapy due to Nsg care  No pain reported  Bed mobility and basic transfer, w/c propulsion using bil UE to propel w/c x 150',  modified independent.  Pt education for bil hamstring and R heel cord self stretching in sitting in the w/c, using 9" high footstool.  Each stretch held x 30 seconds, x 4, wearing DARCO boot RLE. Educated pt about location of hamstring muscles, importance of full ROM at knee and hip for optimal w/c positioning and prosthetic use.  Gait with RW x 125' with 1 standing rest break, supervision, including turns to L and R.  Kassem Kibbe 10/09/2013, 7:57 AM

## 2013-10-10 ENCOUNTER — Inpatient Hospital Stay (HOSPITAL_COMMUNITY): Payer: Medicaid Other

## 2013-10-10 ENCOUNTER — Inpatient Hospital Stay (HOSPITAL_COMMUNITY): Payer: Medicaid Other | Admitting: Occupational Therapy

## 2013-10-10 DIAGNOSIS — S88119A Complete traumatic amputation at level between knee and ankle, unspecified lower leg, initial encounter: Secondary | ICD-10-CM

## 2013-10-10 DIAGNOSIS — L98499 Non-pressure chronic ulcer of skin of other sites with unspecified severity: Secondary | ICD-10-CM

## 2013-10-10 DIAGNOSIS — L02619 Cutaneous abscess of unspecified foot: Secondary | ICD-10-CM

## 2013-10-10 DIAGNOSIS — M726 Necrotizing fasciitis: Secondary | ICD-10-CM

## 2013-10-10 DIAGNOSIS — L03119 Cellulitis of unspecified part of limb: Secondary | ICD-10-CM

## 2013-10-10 DIAGNOSIS — I739 Peripheral vascular disease, unspecified: Secondary | ICD-10-CM

## 2013-10-10 DIAGNOSIS — E1310 Other specified diabetes mellitus with ketoacidosis without coma: Secondary | ICD-10-CM

## 2013-10-10 LAB — GLUCOSE, CAPILLARY
Glucose-Capillary: 147 mg/dL — ABNORMAL HIGH (ref 70–99)
Glucose-Capillary: 109 mg/dL — ABNORMAL HIGH (ref 70–99)
Glucose-Capillary: 144 mg/dL — ABNORMAL HIGH (ref 70–99)
Glucose-Capillary: 104 mg/dL — ABNORMAL HIGH (ref 70–99)

## 2013-10-10 NOTE — Progress Notes (Signed)
Occupational Therapy Discharge Summary  Patient Details  Name: [name] MRN: [id number] Date of Birth: January 13, 1956  Today's Date: 10/10/2013  Patient has met 7 of 7 long term goals due to improved balance and ability to compensate for deficits.  Patient to discharge at overall Modified Independent level.  Patient's care partner unavailable to provide the necessary physical assistance at discharge. Pt and family pursuing NHP or SNF due to medical needs and inaccessible/unsafe home environment.   Reasons goals not met: N/A  Recommendation:  Patient will benefit from ongoing skilled OT services in SNF/NHP to continue to advance functional skills in the area of BADL and Reduce care partner burden.  Equipment: No equipment provided  Reasons for discharge: treatment goals met and discharge from hospital  Patient/family agrees with progress made and goals achieved: Yes  OT Discharge Precautions/Restrictions  Precautions Other Brace/Splint: R DARCO shoe when OOB General   Vital Signs Therapy Vitals Temp: 98.4 F (36.9 C) Temp src: Oral Pulse Rate: 90 Resp: 24 BP: 115/73 mmHg Patient Position, if appropriate: Lying Oxygen Therapy SpO2: 97 % O2 Device: None (Room air) Pain Pain Assessment Pain Assessment: No/denies pain ADL  See FIM Vision/Perception  Vision - History Baseline Vision: Wears glasses all the time Patient Visual Report: No change from baseline Perception Perception: Within Functional Limits Praxis Praxis: Intact  Cognition Overall Cognitive Status: Within Functional Limits for tasks assessed Orientation Level: Oriented X4 Memory: Appears intact Awareness: Appears intact Problem Solving: Appears intact Safety/Judgment: Appears intact Sensation Sensation Light Touch: Appears Intact Coordination Gross Motor Movements are Fluid and Coordinated: Yes Extremity/Trunk Assessment RUE Assessment RUE Assessment: Within Functional Limits LUE  Assessment LUE Assessment: Within Functional Limits  See FIM for current functional status  Paige Monarrez, [facility] 10/10/2013, 3:55 PM

## 2013-10-10 NOTE — Progress Notes (Signed)
Physical Therapy Note  Patient Details  Name: [name] MRN: [id number] Date of Birth: 06-05-1956 Today's Date: 10/10/2013  11:00 - 11:55 55 minutes  Individual session Patient denies pain.  Patient sitting in bed upon entering room. Patient mod I with bed mobility and transfer to wheelchair. Patient propelled self to gym 150 feet. Patient ambulated to/from car transfer and performed with supervision. Patient ambulated up and down 3 steps with bilateral rails and min assist. Patient ambulated 90 feet and 150 feet with RW and supervision. Patient performed strengthening exercises using 3 # on right LE for LAQ's and hip flexion in sitting; orange theraband for bilateral hip abduction in sitting; 7 # bar for bicep curls, chest presses; and 5# bar forshoulder abduction (side to side), rowing, and tricep presses - each for 2 sets of 12 reps each. Patient left sitting on edge of bed in room with all items in reach.   [name] 10/10/2013, 12:20 PM

## 2013-10-10 NOTE — Progress Notes (Signed)
Occupational Therapy Session Note  Patient Details  Name: Treavon Salters MRN: [id number] Date of Birth: 07-Feb-1956  Today's Date: 10/10/2013 Time: 0930-1015 Time Calculation (min): 45 min  Short Term Goals: No short term goals set  Skilled Therapeutic Interventions/Progress Updates:    Engaged in ADL retraining with focus on functional transfers from w/c and RW and increased independence with bathing and dressing tasks.  Pt seated EOB upon arrival, donned Darco shoe mod I and ambulated to bathroom to engage in bathing at seated level in shower. Pt required setup assist to wrap RLE to prevent it from getting wet, but then was mod I with bathing at seated level.  Pt will most likely not be bathing at shower level upon d/c is mod I at sit > stand level from w/c.  Dressing completed at sit > stand level mod I with increased time to don Rt sock.  Toilet transfer performed from w/c level mod I, supervision with RW.  Therapy Documentation Precautions:  Precautions Precautions: Fall Precaution Comments:   Required Braces or Orthoses: Other Brace/Splint Other Brace/Splint: R DARCO shoe when OOB Restrictions Weight Bearing Restrictions: Yes LLE Weight Bearing: Non weight bearing Other Position/Activity Restrictions: keep L knee straight/elevated when in w/c, bed General:   Vital Signs: Therapy Vitals Pulse Rate: 93 BP: 116/98 mmHg Pain:  Pt with no c/o pain  See FIM for current functional status  Therapy/Group: Individual Therapy  [name] 10/10/2013, 12:15 PM

## 2013-10-10 NOTE — Progress Notes (Signed)
Patient ID: [name], male   DOB: 1955-11-08, 57 y.o.   MRN: [id number] Subjective/Complaints: No issues overnite, no other c/o except constipation   Review of Systems - no pains, no nausea or vomiting  Objective: Vital Signs: Blood pressure 98/64, pulse 90, temperature 98.7 F (37.1 C), temperature source Oral, resp. rate 20, height 5\' 3"  (1.6 m), weight 64.8 kg (142 lb 13.7 oz), SpO2 98.00%. No results found. Results for orders placed during the hospital encounter of 09/13/13 (from the past 72 hour(s))  GLUCOSE, CAPILLARY     Status: Abnormal   Collection Time    10/07/13  7:23 AM      Result Value Ref Range   Glucose-Capillary 155 (*) 70 - 99 mg/dL  GLUCOSE, CAPILLARY     Status: Abnormal   Collection Time    10/07/13 11:55 AM      Result Value Ref Range   Glucose-Capillary 130 (*) 70 - 99 mg/dL  GLUCOSE, CAPILLARY     Status: Abnormal   Collection Time    10/07/13  4:33 PM      Result Value Ref Range   Glucose-Capillary 149 (*) 70 - 99 mg/dL  GLUCOSE, CAPILLARY     Status: Abnormal   Collection Time    10/07/13  8:44 PM      Result Value Ref Range   Glucose-Capillary 144 (*) 70 - 99 mg/dL  GLUCOSE, CAPILLARY     Status: Abnormal   Collection Time    10/08/13  7:22 AM      Result Value Ref Range   Glucose-Capillary 154 (*) 70 - 99 mg/dL   Comment 1 Notify RN    GLUCOSE, CAPILLARY     Status: Abnormal   Collection Time    10/08/13 11:28 AM      Result Value Ref Range   Glucose-Capillary 119 (*) 70 - 99 mg/dL   Comment 1 Notify RN    GLUCOSE, CAPILLARY     Status: Abnormal   Collection Time    10/08/13  4:59 PM      Result Value Ref Range   Glucose-Capillary 209 (*) 70 - 99 mg/dL   Comment 1 Notify RN    GLUCOSE, CAPILLARY     Status: None   Collection Time    10/08/13  9:07 PM      Result Value Ref Range   Glucose-Capillary 77  70 - 99 mg/dL  GLUCOSE, CAPILLARY     Status: Abnormal   Collection Time    10/09/13  2:43 AM      Result Value Ref Range   Glucose-Capillary 122 (*) 70 - 99 mg/dL  GLUCOSE, CAPILLARY     Status: Abnormal   Collection Time    10/09/13  7:42 AM      Result Value Ref Range   Glucose-Capillary 142 (*) 70 - 99 mg/dL  GLUCOSE, CAPILLARY     Status: Abnormal   Collection Time    10/09/13 11:06 AM      Result Value Ref Range   Glucose-Capillary 150 (*) 70 - 99 mg/dL  GLUCOSE, CAPILLARY     Status: Abnormal   Collection Time    10/09/13  5:03 PM      Result Value Ref Range   Glucose-Capillary 189 (*) 70 - 99 mg/dL  GLUCOSE, CAPILLARY     Status: Abnormal   Collection Time    10/09/13 10:14 PM      Result Value Ref Range   Glucose-Capillary 107 (*) 70 -  99 mg/dL      HENT: dentition poor  Head: Normocephalic.  Eyes: EOM are normal.  Neck: Normal range of motion. Neck supple. No thyromegaly present.  Cardiovascular: Normal rate and regular rhythm. No murmurs  Respiratory: Effort normal and breath sounds normal. No respiratory distress.  GI: Soft. Bowel sounds are normal. He exhibits no distension.   Shoulder , elbow, wrist And hand ROM normal  Neurological: He is alert and oriented to person, place, and time.  5/5 strength Bilateral delt, bi, tri, grip,  4/5 Bilateral HF and R KE  3- R ankle DF/PF Left stump no drainage R toes 1,4,5 dry gangrene Ext no calf swelling or tenderness  Assessment/Plan: 1. Functional deficits secondary to Left BKA for gangrene (diabetic neuropathy, undiagnosed DM, no medical care) which require 3+ hours per day of interdisciplinary therapy in a comprehensive inpatient rehab setting. Physiatrist is providing close team supervision and 24 hour management of active medical problems listed below. Physiatrist and rehab team continue to assess barriers to discharge/monitor patient progress toward functional and medical goals.  FIM: FIM - Bathing Bathing Steps Patient Completed: Chest;Right Arm;Left Arm;Abdomen;Front perineal area;Buttocks;Right upper leg;Left upper leg Bathing:  4: Min-Patient completes 8-9 54f 10 parts or 75+ percent  FIM - Upper Body Dressing/Undressing Upper body dressing/undressing steps patient completed: Thread/unthread right sleeve of pullover shirt/dresss;Thread/unthread left sleeve of pullover shirt/dress;Put head through opening of pull over shirt/dress;Pull shirt over trunk Upper body dressing/undressing: 7: Complete Independence: No helper FIM - Lower Body Dressing/Undressing Lower body dressing/undressing steps patient completed: Thread/unthread right pants leg;Thread/unthread left pants leg;Pull pants up/down;Don/Doff right shoe;Fasten/unfasten right shoe Lower body dressing/undressing: 6: More than reasonable amount of time  FIM - Toileting Toileting steps completed by patient: Adjust clothing prior to toileting;Performs perineal hygiene;Adjust clothing after toileting Toileting Assistive Devices: Grab bar or rail for support Toileting: 5: Supervision: Safety issues/verbal cues  FIM - Radio producer Devices: Mining engineer Transfers: 5-From toilet/BSC: Supervision (verbal cues/safety issues);5-To toilet/BSC: Supervision (verbal cues/safety issues)  FIM - Engineer, site Assistive Devices: Arm rests Bed/Chair Transfer: 6: Supine > Sit: No assist;6: Bed > Chair or W/C: No assist;6: Assistive device: no helper;6: Sit > Supine: No assist;6: Chair or W/C > Bed: No assist  FIM - Locomotion: Wheelchair Distance: 150 Locomotion: Wheelchair: 6: Travels 150 ft or more, turns around, maneuvers to table, bed or toilet, negotiates 3% grade: maneuvers on rugs and over door sills independently FIM - Locomotion: Ambulation Locomotion: Ambulation Assistive Devices: Administrator Ambulation/Gait Assistance: 5: Supervision Locomotion: Ambulation: 0: Activity did not occur  Comprehension Comprehension Mode: Auditory Comprehension: 6-Follows complex conversation/direction: With extra  time/assistive device  Expression Expression Mode: Verbal Expression: 7-Expresses complex ideas: With no assist  Social Interaction Social Interaction: 7-Interacts appropriately with others - No medications needed.  Problem Solving Problem Solving: 6-Solves complex problems: With extra time  Memory Memory: 6-Assistive device: No helper  Medical Problem List and Plan:  Left BKA, and R necrotic toes  1. DVT Prophylaxis/Anticoagulation:   SQ lovenox.  2. Pain Management: Prn oxcycodone.  3. Mood: Does not express any signs of distress or concerns. LCSW to follow for evaluation.  4. Neuropsych: This patient is capable of making decisions on his own behalf.  5. DM type 2 controlled: Cont current regimen, titrate as indicated.    6. Anemia , guaic - x 1 neg, Hgb 11.4 remains with mild tachy, monitor 7.  Rib pain, resolved, Xrays showing atelectatic changes no signs  or symptoms of PNA 8.  Gangrene toes, great toe demarcating, auto amputation expected, will keep dry during shower   LOS (Days) [place] EVALUATION WAS PERFORMED  [name] E 10/10/2013, 6:58 AM

## 2013-10-10 NOTE — Progress Notes (Signed)
Social Work Patient ID: [name], male   DOB: 1956-07-18, 58 y.o.   MRN: [id number] Met with pt and sister to discuss any other possibilities of place for pt to go.  She feels he can't come to her home due to she would worry Too much while she was working.  Informed her he is mod/i wheelchair and supervision ambulation.  Pt wants to go to a rest Home and be taken care of. He is ok with it being 100 miles away.  Sister feels her home is not an option also due to she heats with wood and that would not be good for pt. Will continue the search for [facility] bed.

## 2013-10-11 ENCOUNTER — Inpatient Hospital Stay (HOSPITAL_COMMUNITY): Payer: Medicaid Other | Admitting: Physical Therapy

## 2013-10-11 LAB — GLUCOSE, CAPILLARY
GLUCOSE-CAPILLARY: 96 mg/dL (ref 70–99)
Glucose-Capillary: 121 mg/dL — ABNORMAL HIGH (ref 70–99)
Glucose-Capillary: 154 mg/dL — ABNORMAL HIGH (ref 70–99)
Glucose-Capillary: 97 mg/dL (ref 70–99)

## 2013-10-11 NOTE — Progress Notes (Signed)
Patient ID: [name], male   DOB: September 28, 1955, 58 y.o.   MRN: [id number] Subjective/Complaints: Asking about auto amputation process   Review of Systems - no pains, no nausea or vomiting  Objective: Vital Signs: Blood pressure 123/78, pulse 88, temperature 98.4 F (36.9 C), temperature source Oral, resp. rate 16, height 5\' 3"  (1.6 m), weight 64.8 kg (142 lb 13.7 oz), SpO2 98.00%. No results found. Results for orders placed during the hospital encounter of 09/13/13 (from the past 72 hour(s))  GLUCOSE, CAPILLARY     Status: Abnormal   Collection Time    10/08/13  7:22 AM      Result Value Ref Range   Glucose-Capillary 154 (*) 70 - 99 mg/dL   Comment 1 Notify RN    GLUCOSE, CAPILLARY     Status: Abnormal   Collection Time    10/08/13 11:28 AM      Result Value Ref Range   Glucose-Capillary 119 (*) 70 - 99 mg/dL   Comment 1 Notify RN    GLUCOSE, CAPILLARY     Status: Abnormal   Collection Time    10/08/13  4:59 PM      Result Value Ref Range   Glucose-Capillary 209 (*) 70 - 99 mg/dL   Comment 1 Notify RN    GLUCOSE, CAPILLARY     Status: None   Collection Time    10/08/13  9:07 PM      Result Value Ref Range   Glucose-Capillary 77  70 - 99 mg/dL  GLUCOSE, CAPILLARY     Status: Abnormal   Collection Time    10/09/13  2:43 AM      Result Value Ref Range   Glucose-Capillary 122 (*) 70 - 99 mg/dL  GLUCOSE, CAPILLARY     Status: Abnormal   Collection Time    10/09/13  7:42 AM      Result Value Ref Range   Glucose-Capillary 142 (*) 70 - 99 mg/dL  GLUCOSE, CAPILLARY     Status: Abnormal   Collection Time    10/09/13 11:06 AM      Result Value Ref Range   Glucose-Capillary 150 (*) 70 - 99 mg/dL  GLUCOSE, CAPILLARY     Status: Abnormal   Collection Time    10/09/13  5:03 PM      Result Value Ref Range   Glucose-Capillary 189 (*) 70 - 99 mg/dL  GLUCOSE, CAPILLARY     Status: Abnormal   Collection Time    10/09/13 10:14 PM      Result Value Ref Range   Glucose-Capillary  107 (*) 70 - 99 mg/dL  GLUCOSE, CAPILLARY     Status: Abnormal   Collection Time    10/10/13  7:40 AM      Result Value Ref Range   Glucose-Capillary 147 (*) 70 - 99 mg/dL   Comment 1 Notify RN    GLUCOSE, CAPILLARY     Status: Abnormal   Collection Time    10/10/13 11:55 AM      Result Value Ref Range   Glucose-Capillary 109 (*) 70 - 99 mg/dL   Comment 1 Notify RN    GLUCOSE, CAPILLARY     Status: Abnormal   Collection Time    10/10/13  5:05 PM      Result Value Ref Range   Glucose-Capillary 144 (*) 70 - 99 mg/dL   Comment 1 Notify RN    GLUCOSE, CAPILLARY     Status: Abnormal   Collection Time  10/10/13  8:45 PM      Result Value Ref Range   Glucose-Capillary 104 (*) 70 - 99 mg/dL      HENT: dentition poor  Head: Normocephalic.  Eyes: EOM are normal.  Neck: Normal range of motion. Neck supple. No thyromegaly present.  Cardiovascular: Normal rate and regular rhythm. No murmurs  Respiratory: Effort normal and breath sounds normal. No respiratory distress.  GI: Soft. Bowel sounds are normal. He exhibits no distension.   Shoulder , elbow, wrist And hand ROM normal  Neurological: He is alert and oriented to person, place, and time.  5/5 strength Bilateral delt, bi, tri, grip,  4/5 Bilateral HF and R KE  3- R ankle DF/PF Left stump no drainage R toes 1,4,5 dry gangrene Ext no calf swelling or tenderness  Assessment/Plan: 1. Functional deficits secondary to Left BKA for gangrene (diabetic neuropathy, undiagnosed DM, no medical care) which require 3+ hours per day of interdisciplinary therapy in a comprehensive inpatient rehab setting. Physiatrist is providing close team supervision and 24 hour management of active medical problems listed below. Physiatrist and rehab team continue to assess barriers to discharge/monitor patient progress toward functional and medical goals.  FIM: FIM - Bathing Bathing Steps Patient Completed: Chest;Right Arm;Left Arm;Abdomen;Front  perineal area;Buttocks;Right upper leg;Left upper leg Bathing: 5: Set-up assist to: Obtain items  FIM - Upper Body Dressing/Undressing Upper body dressing/undressing steps patient completed: Thread/unthread right sleeve of pullover shirt/dresss;Thread/unthread left sleeve of pullover shirt/dress;Put head through opening of pull over shirt/dress;Pull shirt over trunk Upper body dressing/undressing: 7: Complete Independence: No helper FIM - Lower Body Dressing/Undressing Lower body dressing/undressing steps patient completed: Thread/unthread right pants leg;Thread/unthread left pants leg;Pull pants up/down;Don/Doff right shoe;Fasten/unfasten right shoe Lower body dressing/undressing: 6: More than reasonable amount of time  FIM - Toileting Toileting steps completed by patient: Adjust clothing prior to toileting;Performs perineal hygiene;Adjust clothing after toileting Toileting Assistive Devices: Grab bar or rail for support Toileting: 5: Supervision: Safety issues/verbal cues  FIM - Radio producer Devices: Grab bars Toilet Transfers: 6-To toilet/ BSC;6-From toilet/BSC  FIM - Control and instrumentation engineer Devices: Arm rests Bed/Chair Transfer: 6: Supine > Sit: No assist;6: Bed > Chair or W/C: No assist;6: Chair or W/C > Bed: No assist;6: Sit > Supine: No assist  FIM - Locomotion: Wheelchair Distance: 150 Locomotion: Wheelchair: 6: Travels 150 ft or more, turns around, maneuvers to table, bed or toilet, negotiates 3% grade: maneuvers on rugs and over door sills independently FIM - Locomotion: Ambulation Locomotion: Ambulation Assistive Devices: Administrator Ambulation/Gait Assistance: 5: Supervision Locomotion: Ambulation: 5: Travels 150 ft or more with supervision/safety issues  Comprehension Comprehension Mode: Auditory Comprehension: 6-Follows complex conversation/direction: With extra time/assistive device  Expression Expression  Mode: Verbal Expression: 7-Expresses complex ideas: With no assist  Social Interaction Social Interaction: 7-Interacts appropriately with others - No medications needed.  Problem Solving Problem Solving: 6-Solves complex problems: With extra time  Memory Memory: 6-Assistive device: No helper  Medical Problem List and Plan:  Left BKA, and R necrotic toes  1. DVT Prophylaxis/Anticoagulation:   SQ lovenox.  2. Pain Management: Prn oxcycodone.  3. Mood: Does not express any signs of distress or concerns. LCSW to follow for evaluation.  4. Neuropsych: This patient is capable of making decisions on his own behalf.  5. DM type 2 controlled: Cont current regimen, titrate as indicated.    6. Anemia , guaic - x 1 neg, Hgb 11.4 remains with mild tachy, monitor 7.  Rib pain, resolved, Xrays showing atelectatic changes no signs or symptoms of PNA 8.  Gangrene toes, great toe demarcating, auto amputation expected, will keep dry during shower, no sign of infection   LOS (Days) 28 A FACE TO FACE EVALUATION WAS PERFORMED  Ahaana Rochette E 10/11/2013, 6:17 AM

## 2013-10-11 NOTE — Progress Notes (Signed)
Physical Therapy Note  Patient Details  Name: [name] MRN: [id number] Date of Birth: 1956-03-19 Today's Date: 10/11/2013  Time: 1100-1130 30 minutes  1:1 No c/o pain.  Gait 150' with supervision with RW.  W/c throughout unit with mod I.  Seated therex with 5# wt 2 x15 for UEs flexion, chest press, horizontal abd/add, circles, bicep curls.  Pt with no c/o pain during activity.   [name] 10/11/2013, 11:31 AM

## 2013-10-11 NOTE — Progress Notes (Signed)
Physical Therapy Weekly Progress Note  Patient Details  Name: [name] MRN: [id number] Date of Birth: August 05, 1955  Today's Date: 10/11/2013  Patient continues to make steady progress while awaiting for NHP and has met 5 of 8 long term goals.  Short term goals not set due to change in D/C to NHP.  Pt is currently mod I for bed mobility, bed <> w/c transfers, w/c mobility and supervision for gait with RW and L limb in open chain position and min A for stair negotiation.  Pt sutures recently removed and pt fit with shrinker sock.  Per Orthopedics pt may begin prosthetic fitting soon.  Pt continues to be educated on HEP and proper positioning for UE and LE ROM and strengthening.  Pt has also begun standing balance and pre-gait training with WB through L knee to prepare for prosthesis.    Patient continues to demonstrate the following deficits: Impaired LE and UE strength, balance, gait and endurance and therefore will continue to benefit from skilled PT intervention to enhance overall performance with activity tolerance, balance and functional use of  left lower extremity.  See Patient's Care Plan for progression toward long term goals.  Patient progressing toward long term goals..  Plan of care revisions: goals upgraded to reflect pt readiness for prosthetic fitting and training.   [name] 10/11/2013, 8:45 AM

## 2013-10-12 ENCOUNTER — Inpatient Hospital Stay (HOSPITAL_COMMUNITY): Payer: Medicaid Other | Admitting: Physical Therapy

## 2013-10-12 DIAGNOSIS — L089 Local infection of the skin and subcutaneous tissue, unspecified: Secondary | ICD-10-CM

## 2013-10-12 DIAGNOSIS — E1169 Type 2 diabetes mellitus with other specified complication: Secondary | ICD-10-CM

## 2013-10-12 DIAGNOSIS — R509 Fever, unspecified: Secondary | ICD-10-CM

## 2013-10-12 DIAGNOSIS — E1149 Type 2 diabetes mellitus with other diabetic neurological complication: Secondary | ICD-10-CM

## 2013-10-12 LAB — GLUCOSE, CAPILLARY
GLUCOSE-CAPILLARY: 75 mg/dL (ref 70–99)
GLUCOSE-CAPILLARY: 75 mg/dL (ref 70–99)
Glucose-Capillary: 157 mg/dL — ABNORMAL HIGH (ref 70–99)
Glucose-Capillary: 185 mg/dL — ABNORMAL HIGH (ref 70–99)

## 2013-10-12 NOTE — Progress Notes (Signed)
[name] is a 58 y.o. male 02-12-1956 [id number]  Subjective: No new complaints. No new problems. Slept well. Feeling OK.  Objective: Vital signs in last 24 hours: Temp:  [98.4 F (36.9 C)-98.5 F (36.9 C)] 98.5 F (36.9 C) (03/14 0630) Pulse Rate:  [88-99] 97 (03/14 0636) Resp:  [16-20] 19 (03/14 0636) BP: (100-152)/(50-80) 100/64 mmHg (03/14 0636) SpO2:  [92 %-98 %] 92 % (03/14 0636) Weight change:  Last BM Date: 10/10/13  Intake/Output from previous day: 03/13 0701 - 03/14 0700 In: 600 [P.O.:600] Out: 675 [Urine:675] Last cbgs: CBG (last 3)   Recent Labs  10/11/13 2129 10/12/13 0727 10/12/13 1138  GLUCAP 97 157* 75     Physical Exam General: No apparent distress   HEENT: not dry Lungs: Normal effort. Lungs clear to auscultation, no crackles or wheezes. Cardiovascular: Regular rate and rhythm, no edema Abdomen: S/NT/ND; BS(+) Musculoskeletal:  unchanged Neurological: No new neurological deficits Wounds: N/A    Skin: clear  Aging changes Mental state: Alert, oriented, cooperative    Lab Results: BMET    Component Value Date/Time   NA 142 09/23/2013 0425   K 3.9 09/23/2013 0425   CL 103 09/23/2013 0425   CO2 25 09/23/2013 0425   GLUCOSE 146* 09/23/2013 0425   BUN 18 09/23/2013 0425   CREATININE 1.16 09/23/2013 0425   CALCIUM 8.8 09/23/2013 0425   GFRNONAA 69* 09/23/2013 0425   GFRAA 80* 09/23/2013 0425   CBC    Component Value Date/Time   WBC 8.1 09/23/2013 0425   RBC 2.58* 09/23/2013 0425   RBC 2.64* 09/20/2013 0942   HGB 11.7* 10/01/2013 0700   HCT 35.5* 10/01/2013 0700   PLT 384 09/23/2013 0425   MCV 89.5 09/23/2013 0425   MCH 28.7 09/23/2013 0425   MCHC 32.0 09/23/2013 0425   RDW 14.4 09/23/2013 0425   LYMPHSABS 0.8 09/19/2013 1122   MONOABS 0.4 09/19/2013 1122   EOSABS 0.0 09/19/2013 1122   BASOSABS 0.0 09/19/2013 1122    Studies/Results: No results found.  Medications: I have reviewed the patient's current medications.  Assessment/Plan:  Left  BKA, and R necrotic toes  1. DVT Prophylaxis/Anticoagulation: SQ lovenox.  2. Pain Management: Prn oxcycodone.  3. Mood: Does not express any signs of distress or concerns. LCSW to follow for evaluation.  4. Neuropsych: This patient is capable of making decisions on his own behalf.  5. DM type 2 controlled: Cont current regimen, titrate as indicated.  6. Anemia , guaic - x 1 neg, Hgb 11.4 remains with mild tachy, monitor  7. Rib pain, resolved, Xrays showing atelectatic changes no signs or symptoms of PNA  8. Gangrene toes, great toe demarcating, auto amputation expected, will keep dry during shower, no sign of infection   Cont Rx      Length of stay, days: 29  Walker Kehr , MD 10/12/2013, 2:19 PM

## 2013-10-12 NOTE — Progress Notes (Signed)
Physical Therapy Session Note  Patient Details  Name: [name] MRN: [id number] Date of Birth: 07/12/1956  Today's Date: 10/12/2013 Time: [id number] Time Calculation (min): 45 min  Therapy Documentation Precautions:  Precautions: Fall Precaution Comments:   Required Braces or Orthoses: Other Brace/Splint Other Brace/Splint: R DARCO shoe when OOB Restrictions Weight Bearing Restrictions: Yes LLE Weight Bearing: Non weight bearing Other Position/Activity Restrictions: keep L knee straight/elevated when in w/c, bed Pain: denies pain  Therapeutic Activity:(15') Transfer training sit<->stand into RW with S/Mod-I, donning R Darco front off-loading shoe with Independence, Dynamic Sitting Balance with S/Independence at EOB with donning Darco shoe. Therapeutic Exercise:(15') using 5# bar, performed 2 sets of 15 reps for UE exercises including; shoulder flexion to 90 while extending elbows straight, chest press overhead, "canoe paddling"  Side-to-side, biceps curls Gait Training: (15') Using RW 1 x 150' S/Independent without LOB and hopping on R LE.  Patient with one standing rest during gait.   Therapy/Group: Individual Therapy  Clearence Ped 10/12/2013, 5:58 PM

## 2013-10-13 LAB — GLUCOSE, CAPILLARY
GLUCOSE-CAPILLARY: 123 mg/dL — AB (ref 70–99)
Glucose-Capillary: 137 mg/dL — ABNORMAL HIGH (ref 70–99)
Glucose-Capillary: 210 mg/dL — ABNORMAL HIGH (ref 70–99)
Glucose-Capillary: 98 mg/dL (ref 70–99)

## 2013-10-13 NOTE — Progress Notes (Signed)
[name] is a 58 y.o. male Aug 10, 1955 [id number]  Subjective: No new complaints. No new problems. Slept well. Feeling OK.  Objective: Vital signs in last 24 hours: Temp:  [98.4 F (36.9 C)-98.8 F (37.1 C)] 98.4 F (36.9 C) (03/15 0415) Pulse Rate:  [84-102] 99 (03/15 0416) Resp:  [17-19] 19 (03/15 0416) BP: (91-120)/(55-80) 111/77 mmHg (03/15 0416) SpO2:  [95 %-98 %] 95 % (03/15 0416) Weight change:  Last BM Date: 10/12/13  Intake/Output from previous day: 03/14 0701 - 03/15 0700 In: 960 [P.O.:960] Out: -  Last cbgs: CBG (last 3)   Recent Labs  10/12/13 2039 10/13/13 0747 10/13/13 1128  GLUCAP 75 137* 123*     Physical Exam General: No apparent distress   HEENT: not dry Lungs: Normal effort. Lungs clear to auscultation, no crackles or wheezes. Cardiovascular: Regular rate and rhythm, no edema Abdomen: S/NT/ND; BS(+) Musculoskeletal:  unchanged Neurological: No new neurological deficits Wounds: N/A    Skin: peeling skin on foot. Aging changes, dandruff Mental state: Alert, oriented, cooperative    Lab Results: BMET    Component Value Date/Time   NA 142 09/23/2013 0425   K 3.9 09/23/2013 0425   CL 103 09/23/2013 0425   CO2 25 09/23/2013 0425   GLUCOSE 146* 09/23/2013 0425   BUN 18 09/23/2013 0425   CREATININE 1.16 09/23/2013 0425   CALCIUM 8.8 09/23/2013 0425   GFRNONAA 69* 09/23/2013 0425   GFRAA 80* 09/23/2013 0425   CBC    Component Value Date/Time   WBC 8.1 09/23/2013 0425   RBC 2.58* 09/23/2013 0425   RBC 2.64* 09/20/2013 0942   HGB 11.7* 10/01/2013 0700   HCT 35.5* 10/01/2013 0700   PLT 384 09/23/2013 0425   MCV 89.5 09/23/2013 0425   MCH 28.7 09/23/2013 0425   MCHC 32.0 09/23/2013 0425   RDW 14.4 09/23/2013 0425   LYMPHSABS 0.8 09/19/2013 1122   MONOABS 0.4 09/19/2013 1122   EOSABS 0.0 09/19/2013 1122   BASOSABS 0.0 09/19/2013 1122    Studies/Results: No results found.  Medications: I have reviewed the patient's current  medications.  Assessment/Plan:  Left BKA, and R necrotic toes  1. DVT Prophylaxis/Anticoagulation: SQ lovenox.  2. Pain Management: Prn oxcycodone.  3. Mood: Does not express any signs of distress or concerns. LCSW to follow for evaluation.  4. Neuropsych: This patient is capable of making decisions on his own behalf.  5. DM type 2 controlled: Cont current regimen, titrate as indicated.  6. Anemia , guaic - x 1 neg, Hgb 11.4 remains with mild tachy, monitor  7. Rib pain, resolved, Xrays showing atelectatic changes no signs or symptoms of PNA  8. Gangrene toes, great toe demarcating, auto amputation expected, will keep dry during shower, no sign of infection   Cont Rx      Length of stay, days: 30  Walker Kehr , MD 10/13/2013, 2:12 PM

## 2013-10-14 ENCOUNTER — Inpatient Hospital Stay (HOSPITAL_COMMUNITY): Payer: Medicaid Other | Admitting: Physical Therapy

## 2013-10-14 DIAGNOSIS — L03119 Cellulitis of unspecified part of limb: Secondary | ICD-10-CM

## 2013-10-14 DIAGNOSIS — L02619 Cutaneous abscess of unspecified foot: Secondary | ICD-10-CM

## 2013-10-14 DIAGNOSIS — M726 Necrotizing fasciitis: Secondary | ICD-10-CM

## 2013-10-14 DIAGNOSIS — L98499 Non-pressure chronic ulcer of skin of other sites with unspecified severity: Secondary | ICD-10-CM

## 2013-10-14 DIAGNOSIS — I739 Peripheral vascular disease, unspecified: Secondary | ICD-10-CM

## 2013-10-14 DIAGNOSIS — S88119A Complete traumatic amputation at level between knee and ankle, unspecified lower leg, initial encounter: Secondary | ICD-10-CM

## 2013-10-14 DIAGNOSIS — E1310 Other specified diabetes mellitus with ketoacidosis without coma: Secondary | ICD-10-CM

## 2013-10-14 LAB — BASIC METABOLIC PANEL
BUN: 24 mg/dL — ABNORMAL HIGH (ref 6–23)
CALCIUM: 9.7 mg/dL (ref 8.4–10.5)
CO2: 27 mEq/L (ref 19–32)
Chloride: 101 mEq/L (ref 96–112)
Creatinine, Ser: 0.96 mg/dL (ref 0.50–1.35)
GFR calc Af Amer: >90 mL/min (ref >90)
GFR calc non Af Amer: >90 mL/min (ref >90)
GLUCOSE: 146 mg/dL — AB (ref 70–99)
Potassium: 4.5 mEq/L (ref 3.7–5.3)
Sodium: 141 mEq/L (ref 137–147)

## 2013-10-14 LAB — GLUCOSE, CAPILLARY
GLUCOSE-CAPILLARY: 110 mg/dL — AB (ref 70–99)
Glucose-Capillary: 135 mg/dL — ABNORMAL HIGH (ref 70–99)
Glucose-Capillary: 163 mg/dL — ABNORMAL HIGH (ref 70–99)
Glucose-Capillary: 95 mg/dL (ref 70–99)

## 2013-10-14 LAB — CBC
HCT: 34.5 % — ABNORMAL LOW (ref 39.0–52.0)
HEMOGLOBIN: 11.3 g/dL — AB (ref 13.0–17.0)
MCH: 28.3 pg (ref 26.0–34.0)
MCHC: 32.8 g/dL (ref 30.0–36.0)
MCV: 86.5 fL (ref 78.0–100.0)
Platelets: 355 10*3/uL (ref 150–400)
RBC: 3.99 MIL/uL — ABNORMAL LOW (ref 4.22–5.81)
RDW: 14 % (ref 11.5–15.5)
WBC: 9.9 10*3/uL (ref 4.0–10.5)

## 2013-10-14 NOTE — Progress Notes (Addendum)
Patient ID: [name], male   DOB: February 18, 1956, 58 y.o.   MRN: [id number] Subjective/Complaints: Dizziness with standing, 5mmHg drop with standing   Review of Systems - no pains, no nausea or vomiting  Objective: Vital Signs: Blood pressure 99/66, pulse 104, temperature 98.6 F (37 C), temperature source Oral, resp. rate 20, height 5\' 3"  (1.6 m), weight 64.8 kg (142 lb 13.7 oz), SpO2 96.00%. No results found. Results for orders placed during the hospital encounter of 09/13/13 (from the past 72 hour(s))  GLUCOSE, CAPILLARY     Status: Abnormal   Collection Time    10/11/13  7:28 AM      Result Value Ref Range   Glucose-Capillary 121 (*) 70 - 99 mg/dL   Comment 1 Notify RN    GLUCOSE, CAPILLARY     Status: None   Collection Time    10/11/13 11:56 AM      Result Value Ref Range   Glucose-Capillary 96  70 - 99 mg/dL   Comment 1 Notify RN    GLUCOSE, CAPILLARY     Status: Abnormal   Collection Time    10/11/13  4:49 PM      Result Value Ref Range   Glucose-Capillary 154 (*) 70 - 99 mg/dL   Comment 1 Notify RN    GLUCOSE, CAPILLARY     Status: None   Collection Time    10/11/13  9:29 PM      Result Value Ref Range   Glucose-Capillary 97  70 - 99 mg/dL  GLUCOSE, CAPILLARY     Status: Abnormal   Collection Time    10/12/13  7:27 AM      Result Value Ref Range   Glucose-Capillary 157 (*) 70 - 99 mg/dL   Comment 1 Notify RN    GLUCOSE, CAPILLARY     Status: None   Collection Time    10/12/13 11:38 AM      Result Value Ref Range   Glucose-Capillary 75  70 - 99 mg/dL   Comment 1 Notify RN    GLUCOSE, CAPILLARY     Status: Abnormal   Collection Time    10/12/13  5:10 PM      Result Value Ref Range   Glucose-Capillary 185 (*) 70 - 99 mg/dL   Comment 1 Notify RN    GLUCOSE, CAPILLARY     Status: None   Collection Time    10/12/13  8:39 PM      Result Value Ref Range   Glucose-Capillary 75  70 - 99 mg/dL   Comment 1 Notify RN    GLUCOSE, CAPILLARY     Status: Abnormal   Collection Time    10/13/13  7:47 AM      Result Value Ref Range   Glucose-Capillary 137 (*) 70 - 99 mg/dL  GLUCOSE, CAPILLARY     Status: Abnormal   Collection Time    10/13/13 11:28 AM      Result Value Ref Range   Glucose-Capillary 123 (*) 70 - 99 mg/dL  GLUCOSE, CAPILLARY     Status: Abnormal   Collection Time    10/13/13  4:49 PM      Result Value Ref Range   Glucose-Capillary 210 (*) 70 - 99 mg/dL  GLUCOSE, CAPILLARY     Status: None   Collection Time    10/13/13  8:35 PM      Result Value Ref Range   Glucose-Capillary 98  70 - 99 mg/dL  HENT: dentition poor  Head: Normocephalic.  Eyes: EOM are normal.   Shoulder , elbow, wrist And hand ROM normal  Neurological: He is alert and oriented to person, place, and time.  5/5 strength Bilateral delt, bi, tri, grip,  4/5 Bilateral HF and R KE  3- R ankle DF/PF  R toes 1,4,5 dry gangrene Ext no calf swelling or tenderness  Assessment/Plan: 1. Functional deficits secondary to Left BKA for gangrene (diabetic neuropathy, undiagnosed DM, no medical care) which require 3+ hours per day of interdisciplinary therapy in a comprehensive inpatient rehab setting. Physiatrist is providing close team supervision and 24 hour management of active medical problems listed below. Physiatrist and rehab team continue to assess barriers to discharge/monitor patient progress toward functional and medical goals.  FIM: FIM - Bathing Bathing Steps Patient Completed: Chest;Right Arm;Left Arm;Abdomen;Front perineal area;Buttocks;Right upper leg;Left upper leg Bathing: 5: Set-up assist to: Obtain items  FIM - Upper Body Dressing/Undressing Upper body dressing/undressing steps patient completed: Thread/unthread right sleeve of pullover shirt/dresss;Thread/unthread left sleeve of pullover shirt/dress;Put head through opening of pull over shirt/dress;Pull shirt over trunk Upper body dressing/undressing: 7: Complete Independence: No helper FIM -  Lower Body Dressing/Undressing Lower body dressing/undressing steps patient completed: Thread/unthread right pants leg;Thread/unthread left pants leg;Pull pants up/down;Don/Doff right shoe;Fasten/unfasten right shoe Lower body dressing/undressing: 6: More than reasonable amount of time  FIM - Toileting Toileting steps completed by patient: Adjust clothing prior to toileting;Performs perineal hygiene;Adjust clothing after toileting Toileting Assistive Devices: Grab bar or rail for support Toileting: 6: Assistive device: No helper  FIM - Radio producer Devices: Grab bars Toilet Transfers: 6-To toilet/ BSC;6-From toilet/BSC  FIM - Control and instrumentation engineer Devices: Arm rests Bed/Chair Transfer: 6: Supine > Sit: No assist;6: Bed > Chair or W/C: No assist;6: Chair or W/C > Bed: No assist;6: Sit > Supine: No assist  FIM - Locomotion: Wheelchair Distance: 150 Locomotion: Wheelchair: 6: Travels 150 ft or more, turns around, maneuvers to table, bed or toilet, negotiates 3% grade: maneuvers on rugs and over door sills independently FIM - Locomotion: Ambulation Locomotion: Ambulation Assistive Devices: Administrator Ambulation/Gait Assistance: 5: Supervision Locomotion: Ambulation: 5: Travels 150 ft or more with supervision/safety issues  Comprehension Comprehension Mode: Auditory Comprehension: 6-Follows complex conversation/direction: With extra time/assistive device  Expression Expression Mode: Verbal Expression: 1-Expresses basis less than 25% of the time/requires cueing greater than 75% of the time.  Social Interaction Social Interaction: 7-Interacts appropriately with others - No medications needed.  Problem Solving Problem Solving: 6-Solves complex problems: With extra time  Memory Memory: 6-More than reasonable amt of time  Medical Problem List and Plan:  Left BKA, and R necrotic toes  1. DVT Prophylaxis/Anticoagulation:    SQ lovenox.  2. Pain Management: Prn oxcycodone.  3. Mood: Does not express any signs of distress or concerns. LCSW to follow for evaluation.  4. Neuropsych: This patient is capable of making decisions on his own behalf.  5. DM type 2 controlled: Cont current regimen, titrate as indicated.    6. Anemia , guaic - x 1 neg, last Hgb 11.4 remains with mild tachy, monitor  7.  Gangrene toes, great toe demarcating, auto amputation expected, will keep dry during shower, no sign of infection 8.  Orthostatic Hypotension check BMET and CBC, d/c metoprolol  LOS (Days) 31 A FACE TO FACE EVALUATION WAS PERFORMED  KIRSTEINS,ANDREW E 10/14/2013, 7:05 AM

## 2013-10-14 NOTE — Progress Notes (Signed)
Physical Therapy Session Note  Patient Details  Name: [name] MRN: [id number] Date of Birth: 07-05-1956  Today's Date: 10/14/2013 Time: [id number] Time Calculation (min): 46 min  Short Term Goals: No short term goals set  Skilled Therapeutic Interventions/Progress Updates:   Pt reporting drop in BP this am but denies dizziness or lightheadedness.  While awaiting medications from RN assessed vitals in sitting and standing; both [facility] 112/77, 97 bpm in sitting, 113/77, 105 bpm in standing.  Performed transfers bed <> w/c and w/c mobility mod I in controlled environment.  Continued stair negotiation training up/down 6 stairs (4" tall) with bilat UE support on rails and min A; pt did not feel comfortable progressing to negotiation of 6" step.  Continued gait training PWB through L knee on stool during gait with RW x 75' + 50' in controlled environment with min A and intermittent verbal cues for safety/sequencing.  Pt reporting less strain through bilat UE with LLE in WB position during gait.  Back in room performed standing balance training with LLE PWB through knee on stool with UE performing bimanual task (folding clothes) and reaching out of BOS with supervision and no LOB.  Returned to EOB and placed w/c at foot of bed to allow pt to perform mod I transfers.    Therapy Documentation Precautions:  Precautions Precautions: Fall Precaution Comments:   Required Braces or Orthoses: Other Brace/Splint Other Brace/Splint: R DARCO shoe when OOB Restrictions Weight Bearing Restrictions: Yes LLE Weight Bearing: Non weight bearing Other Position/Activity Restrictions: keep L knee straight/elevated when in w/c, bed Pain: Pain Assessment Pain Assessment: No/denies pain Pain Score: 0-No pain Patients Stated Pain Goal: 3 Multiple Pain Sites: No Locomotion : Ambulation Ambulation/Gait Assistance: 4: Min assist Wheelchair Mobility Distance: 150   See FIM for current functional  status  Therapy/Group: Individual Therapy  Raylene Everts [facility] 10/14/2013, 12:13 PM

## 2013-10-15 ENCOUNTER — Inpatient Hospital Stay (HOSPITAL_COMMUNITY): Payer: Medicaid Other | Admitting: Physical Therapy

## 2013-10-15 DIAGNOSIS — I739 Peripheral vascular disease, unspecified: Secondary | ICD-10-CM

## 2013-10-15 DIAGNOSIS — L02619 Cutaneous abscess of unspecified foot: Secondary | ICD-10-CM

## 2013-10-15 DIAGNOSIS — M726 Necrotizing fasciitis: Secondary | ICD-10-CM

## 2013-10-15 DIAGNOSIS — S88119A Complete traumatic amputation at level between knee and ankle, unspecified lower leg, initial encounter: Secondary | ICD-10-CM

## 2013-10-15 DIAGNOSIS — L98499 Non-pressure chronic ulcer of skin of other sites with unspecified severity: Secondary | ICD-10-CM

## 2013-10-15 DIAGNOSIS — E1310 Other specified diabetes mellitus with ketoacidosis without coma: Secondary | ICD-10-CM

## 2013-10-15 DIAGNOSIS — L03119 Cellulitis of unspecified part of limb: Secondary | ICD-10-CM

## 2013-10-15 LAB — GLUCOSE, CAPILLARY
GLUCOSE-CAPILLARY: 105 mg/dL — AB (ref 70–99)
GLUCOSE-CAPILLARY: 130 mg/dL — AB (ref 70–99)
Glucose-Capillary: 175 mg/dL — ABNORMAL HIGH (ref 70–99)
Glucose-Capillary: 123 mg/dL — ABNORMAL HIGH (ref 70–99)

## 2013-10-15 NOTE — Progress Notes (Signed)
Patient ID: [name], male   DOB: [birth year], 58 y.o.   MRN: [id number] Subjective/Complaints: No dizziness this am   Review of Systems - no pains, no nausea or vomiting  Objective: Vital Signs: Blood pressure 118/69, pulse 97, temperature 98.3 F (36.8 C), temperature source Oral, resp. rate 18, height $RemoveBe'5\' 3"'saceOlRwM$  (1.6 m), weight 64.8 kg (142 lb 13.7 oz), SpO2 97.00%. No results found. Results for orders placed during the hospital encounter of 09/13/13 (from the past 72 hour(s))  GLUCOSE, CAPILLARY     Status: Abnormal   Collection Time    10/12/13  7:27 AM      Result Value Ref Range   Glucose-Capillary 157 (*) 70 - 99 mg/dL   Comment 1 Notify RN    GLUCOSE, CAPILLARY     Status: None   Collection Time    10/12/13 11:38 AM      Result Value Ref Range   Glucose-Capillary 75  70 - 99 mg/dL   Comment 1 Notify RN    GLUCOSE, CAPILLARY     Status: Abnormal   Collection Time    10/12/13  5:10 PM      Result Value Ref Range   Glucose-Capillary 185 (*) 70 - 99 mg/dL   Comment 1 Notify RN    GLUCOSE, CAPILLARY     Status: None   Collection Time    10/12/13  8:39 PM      Result Value Ref Range   Glucose-Capillary 75  70 - 99 mg/dL   Comment 1 Notify RN    GLUCOSE, CAPILLARY     Status: Abnormal   Collection Time    10/13/13  7:47 AM      Result Value Ref Range   Glucose-Capillary 137 (*) 70 - 99 mg/dL  GLUCOSE, CAPILLARY     Status: Abnormal   Collection Time    10/13/13 11:28 AM      Result Value Ref Range   Glucose-Capillary 123 (*) 70 - 99 mg/dL  GLUCOSE, CAPILLARY     Status: Abnormal   Collection Time    10/13/13  4:49 PM      Result Value Ref Range   Glucose-Capillary 210 (*) 70 - 99 mg/dL  GLUCOSE, CAPILLARY     Status: None   Collection Time    10/13/13  8:35 PM      Result Value Ref Range   Glucose-Capillary 98  70 - 99 mg/dL  GLUCOSE, CAPILLARY     Status: Abnormal   Collection Time    10/14/13  7:19 AM      Result Value Ref Range   Glucose-Capillary 135 (*)  70 - 99 mg/dL   Comment 1 Notify RN    CBC     Status: Abnormal   Collection Time    10/14/13  8:05 AM      Result Value Ref Range   WBC 9.9  4.0 - 10.5 K/uL   RBC 3.99 (*) 4.22 - 5.81 MIL/uL   Hemoglobin 11.3 (*) 13.0 - 17.0 g/dL   HCT 34.5 (*) 39.0 - 52.0 %   MCV 86.5  78.0 - 100.0 fL   MCH 28.3  26.0 - 34.0 pg   MCHC 32.8  30.0 - 36.0 g/dL   RDW 14.0  11.5 - 15.5 %   Platelets 355  150 - 400 K/uL  BASIC METABOLIC PANEL     Status: Abnormal   Collection Time    10/14/13  8:05 AM  Result Value Ref Range   Sodium 141  137 - 147 mEq/L   Potassium 4.5  3.7 - 5.3 mEq/L   Chloride 101  96 - 112 mEq/L   CO2 27  19 - 32 mEq/L   Glucose, Bld 146 (*) 70 - 99 mg/dL   BUN 24 (*) 6 - 23 mg/dL   Creatinine, Ser 0.96  0.50 - 1.35 mg/dL   Calcium 9.7  8.4 - 10.5 mg/dL   GFR calc non Af Amer >90  >90 mL/min   GFR calc Af Amer >90  >90 mL/min   Comment: (NOTE)     The eGFR has been calculated using the CKD EPI equation.     This calculation has not been validated in all clinical situations.     eGFR's persistently <90 mL/min signify possible Chronic Kidney     Disease.  GLUCOSE, CAPILLARY     Status: Abnormal   Collection Time    10/14/13 11:17 AM      Result Value Ref Range   Glucose-Capillary 163 (*) 70 - 99 mg/dL   Comment 1 Notify RN    GLUCOSE, CAPILLARY     Status: None   Collection Time    10/14/13  4:59 PM      Result Value Ref Range   Glucose-Capillary 95  70 - 99 mg/dL  GLUCOSE, CAPILLARY     Status: Abnormal   Collection Time    10/14/13  9:16 PM      Result Value Ref Range   Glucose-Capillary 110 (*) 70 - 99 mg/dL      HENT: dentition poor  Head: Normocephalic.  Eyes: EOM are normal.  Cor RRR no murmur Abd soft NT Lungs clear Shoulder , elbow, wrist And hand ROM normal  Neurological: He is alert and oriented to person, place, and time.  5/5 strength Bilateral delt, bi, tri, grip,  4/5 Bilateral HF and R KE  3- R ankle DF/PF  R toes 1,4,5 dry  gangrene Ext no calf swelling or tenderness  Assessment/Plan: 1. Functional deficits secondary to Left BKA for gangrene (diabetic neuropathy, undiagnosed DM, no medical care) which require 3+ hours per day of interdisciplinary therapy in a comprehensive inpatient rehab setting. Physiatrist is providing close team supervision and 24 hour management of active medical problems listed below. Physiatrist and rehab team continue to assess barriers to discharge/monitor patient progress toward functional and medical goals.  FIM: FIM - Bathing Bathing Steps Patient Completed: Chest;Right Arm;Left Arm;Abdomen;Front perineal area;Buttocks;Right upper leg;Left upper leg Bathing: 5: Set-up assist to: Obtain items  FIM - Upper Body Dressing/Undressing Upper body dressing/undressing steps patient completed: Thread/unthread right sleeve of pullover shirt/dresss;Thread/unthread left sleeve of pullover shirt/dress;Put head through opening of pull over shirt/dress;Pull shirt over trunk Upper body dressing/undressing: 7: Complete Independence: No helper FIM - Lower Body Dressing/Undressing Lower body dressing/undressing steps patient completed: Thread/unthread right pants leg;Thread/unthread left pants leg;Pull pants up/down;Don/Doff right shoe;Fasten/unfasten right shoe Lower body dressing/undressing: 6: More than reasonable amount of time  FIM - Toileting Toileting steps completed by patient: Adjust clothing prior to toileting;Performs perineal hygiene;Adjust clothing after toileting Toileting Assistive Devices: Grab bar or rail for support Toileting: 6: Assistive device: No helper  FIM - Radio producer Devices: Product manager Transfers: 6-To toilet/ BSC;6-From toilet/BSC  FIM - Control and instrumentation engineer Devices: Environmental consultant;Arm rests Bed/Chair Transfer: 6: Supine > Sit: No assist;6: Sit > Supine: No assist;6: Bed > Chair or W/C: No assist;6: Chair or W/C  >  Bed: No assist  FIM - Locomotion: Wheelchair Distance: 150 Locomotion: Wheelchair: 6: Travels 150 ft or more, turns around, maneuvers to table, bed or toilet, negotiates 3% grade: maneuvers on rugs and over door sills independently FIM - Locomotion: Ambulation Locomotion: Ambulation Assistive Devices: Administrator Ambulation/Gait Assistance: 4: Min assist Locomotion: Ambulation: 4: Travels 150 ft or more with minimal assistance (Pt.>75%)  Comprehension Comprehension Mode: Auditory Comprehension: 6-Follows complex conversation/direction: With extra time/assistive device  Expression Expression Mode: Verbal Expression: 4-Expresses basic 75 - 89% of the time/requires cueing 10 - 24% of the time. Needs helper to occlude trach/needs to repeat words.  Social Interaction Social Interaction: 6-Interacts appropriately with others with medication or extra time (anti-anxiety, antidepressant).  Problem Solving Problem Solving: 6-Solves complex problems: With extra time  Memory Memory: 6-More than reasonable amt of time  Medical Problem List and Plan:  Left BKA, and R necrotic toes  1. DVT Prophylaxis/Anticoagulation:   SQ lovenox.  2. Pain Management: Prn oxcycodone.  3. Mood: Does not express any signs of distress or concerns. LCSW to follow for evaluation.  4. Neuropsych: This patient is capable of making decisions on his own behalf.  5. DM type 2 controlled: Cont current regimen, titrate as indicated.    6. Anemia , guaic - x 1 neg, last Hgb 11.4 remains with mild tachy, monitor  7.  Gangrene toes, great toe demarcating, auto amputation expected, will keep dry during shower, no sign of infection 8.  Orthostatic Hypotension check BMET and CBC, d/c metoprolol  LOS (Days) 32 A FACE TO FACE EVALUATION WAS PERFORMED  [name] E 10/15/2013, 7:00 AM

## 2013-10-15 NOTE — Progress Notes (Signed)
Physical Therapy Session Note  Patient Details  Name: [name] MRN: [id number] Date of Birth: 1956/04/17  Today's Date: 10/15/2013 Time: [id number] Time Calculation (min): 47 min  Short Term Goals: No short term goals set  Skilled Therapeutic Interventions/Progress Updates:   Pt reporting improved BP today.  Pt transported down to gym in w/c mod I and transferred to mat mod I.  Performed dynamic standing balance training with LLE PWB through knee on stool during UE dynamic activity of catching basketball in various directions outside of BOS.  Performed one step/curb training x 2 reps with min A for balance while hopping retro to ascend one step and forwards to descend while placing RW one and off of step with min A.  Performed self stretching on edge of mat of L hamstring muscles with partial long sitting (LLE on mat straight, RLE off edge of mat) x 2:30 total.  Transitioned to prone for hip flexor stretching x 2 minutes.  Transitioned to quadruped <> tall kneeling without UE support for trunk and hip stabilization training.  In quadruped performed alternating, contralateral UE and LE extension x 2 reps each side with min A for stability.  Returned to room in w/c.    Therapy Documentation Precautions:  Precautions Precautions: Fall Precaution Comments:   Required Braces or Orthoses: Other Brace/Splint Other Brace/Splint: R DARCO shoe when OOB Restrictions Weight Bearing Restrictions: Yes LLE Weight Bearing: Non weight bearing Other Position/Activity Restrictions: keep L knee straight/elevated when in w/c, bed Pain:  No c/o pain  See FIM for current functional status  Therapy/Group: Individual Therapy  Raylene Everts [facility] 10/15/2013, 11:40 AM

## 2013-10-16 ENCOUNTER — Inpatient Hospital Stay (HOSPITAL_COMMUNITY): Payer: Medicaid Other | Admitting: Physical Therapy

## 2013-10-16 LAB — GLUCOSE, CAPILLARY
GLUCOSE-CAPILLARY: 147 mg/dL — AB (ref 70–99)
GLUCOSE-CAPILLARY: 129 mg/dL — AB (ref 70–99)
GLUCOSE-CAPILLARY: 81 mg/dL (ref 70–99)
Glucose-Capillary: 103 mg/dL — ABNORMAL HIGH (ref 70–99)

## 2013-10-16 NOTE — Progress Notes (Signed)
Physical Therapy Discharge Summary  Patient Details  Name: [name] MRN: [id number] Date of Birth: [birth year]  Today's Date: 10/16/2013 Time: 1020-1047 Time Calculation (min): 27 min  Patient has met 7 of 8 long term goals due to improved activity tolerance, improved balance, improved postural control, increased strength, increased range of motion, decreased pain, ability to compensate for deficits and functional use of  right upper extremity, right lower extremity and left upper extremity.  Patient to discharge at a wheelchair level Modified Independent and supervision for gait with RW.   Patient's care partner unavailable to provide the necessary physical and supervision assistance at discharge and therefore pt is awaiting D/C to NHP.  Reasons goals not met: Pt did not reach supervision stair goal; secondary to anxiety and imbalance pt requires min A which is adequate for D/C secondary to pt awaiting NHP.  Recommendation:  Patient will benefit from ongoing skilled PT services for pre-prosthetic and prosthetic training once insurance/Medicaid approved to continue to advance safe functional mobility, address ongoing impairments in UE and LE weakness, balance, gait, and minimize fall risk.  Equipment: manual w/c  Reasons for discharge: treatment goals met and awaiting NHP  Patient/family agrees with progress made and goals achieved: Yes  PT Discharge  AM session: Pt in w/c; in gym transferred w/c <> mat mod I.  Performed self stretching of L hamstring in partial long sitting x 1-2 minutes and prone hip flexor stretching x 2-3 minutes.  Transitioned to dynamic balance training and core strengthening in quadruped during 3 reps alternating, contralateral UE and LE extension with min A and then transitioning to tall kneeling on mat without UE support but during dynamic UE movements of bilat UE flexion x 10, reaching and passing medicine ball in various directions out of BOS, medicine ball  chest presses x 10 and medicine ball rotation to L and R x 10.  Returned to room mod I.  Pt reporting that he has an interview with a home that may accept him. PT to D/C pt at this time.    Pain Pain Assessment Pain Assessment: No/denies pain Sensation Sensation Light Touch: Appears Intact Coordination Gross Motor Movements are Fluid and Coordinated: Yes Motor  Motor Motor: Within Functional Limits  Mobility Bed Mobility Supine to Sit: 6: Modified independent (Device/Increase time) Sit to Supine: 6: Modified independent (Device/Increase time) Transfers Stand Pivot Transfers: 6: Modified independent (Device/Increase time) (with RW and DARCO boot) Squat Pivot Transfers: 6: Modified independent (Device/Increase time) Locomotion  Ambulation Ambulation/Gait Assistance: 5: Supervision Ambulation Distance (Feet): 150 Feet Assistive device: Rolling walker (DARCO shoe and PWB through L knee on stool) Stairs / Additional Locomotion Stairs: Yes Stairs Assistance: 4: Min assist Stairs Assistance Details (indicate cue type and reason): Min A for stairs with bilat rails and one step up retro/forwards with RW Stair Management Technique: Two rails;Step to pattern;Forwards;Backwards;With walker Number of Stairs: 8 Height of Stairs: 4 Wheelchair Mobility Wheelchair Assistance: 6: Modified independent (Device/Increase time) Environmental health practitioner: Both upper extremities Wheelchair Parts Management: Independent Distance: 150  Trunk/Postural Assessment  Cervical Assessment Cervical Assessment: Within Functional Limits Thoracic Assessment Thoracic Assessment: Within Functional Limits Lumbar Assessment Lumbar Assessment: Within Functional Limits Postural Control Postural Control: Within Functional Limits  Balance Static Sitting Balance Static Sitting - Level of Assistance: 6: Modified independent (Device/Increase time) Dynamic Sitting Balance Dynamic Sitting - Level of Assistance: 6:  Modified independent (Device/Increase time) Static Standing Balance Static Standing - Balance Support: Right upper extremity supported;Left upper extremity supported Static Standing -  Level of Assistance: 5: Stand by assistance Dynamic Standing Balance Dynamic Standing - Balance Support: Right upper extremity supported;Left upper extremity supported Dynamic Standing - Level of Assistance: 5: Stand by assistance Extremity Assessment  RLE Assessment RLE Assessment: Within Functional Limits LLE Assessment LLE Assessment: Within Functional Limits  See FIM for current functional status  Raylene Everts [facility] 10/16/2013, 12:07 PM

## 2013-10-16 NOTE — Progress Notes (Signed)
Patient ID: [name], male   DOB: [birth year], 58 y.o.   MRN: [id number] Subjective/Complaints: No dizziness this am   Review of Systems - no pains, no nausea or vomiting  Objective: Vital Signs: Blood pressure 113/69, pulse 99, temperature 98.3 F (36.8 C), temperature source Oral, resp. rate 19, height $RemoveBe'5\' 3"'TPRzvgfva$  (1.6 m), weight 64.8 kg (142 lb 13.7 oz), SpO2 96.00%. No results found. Results for orders placed during the hospital encounter of 09/13/13 (from the past 72 hour(s))  GLUCOSE, CAPILLARY     Status: Abnormal   Collection Time    10/13/13  7:47 AM      Result Value Ref Range   Glucose-Capillary 137 (*) 70 - 99 mg/dL  GLUCOSE, CAPILLARY     Status: Abnormal   Collection Time    10/13/13 11:28 AM      Result Value Ref Range   Glucose-Capillary 123 (*) 70 - 99 mg/dL  GLUCOSE, CAPILLARY     Status: Abnormal   Collection Time    10/13/13  4:49 PM      Result Value Ref Range   Glucose-Capillary 210 (*) 70 - 99 mg/dL  GLUCOSE, CAPILLARY     Status: None   Collection Time    10/13/13  8:35 PM      Result Value Ref Range   Glucose-Capillary 98  70 - 99 mg/dL  GLUCOSE, CAPILLARY     Status: Abnormal   Collection Time    10/14/13  7:19 AM      Result Value Ref Range   Glucose-Capillary 135 (*) 70 - 99 mg/dL   Comment 1 Notify RN    CBC     Status: Abnormal   Collection Time    10/14/13  8:05 AM      Result Value Ref Range   WBC 9.9  4.0 - 10.5 K/uL   RBC 3.99 (*) 4.22 - 5.81 MIL/uL   Hemoglobin 11.3 (*) 13.0 - 17.0 g/dL   HCT 34.5 (*) 39.0 - 52.0 %   MCV 86.5  78.0 - 100.0 fL   MCH 28.3  26.0 - 34.0 pg   MCHC 32.8  30.0 - 36.0 g/dL   RDW 14.0  11.5 - 15.5 %   Platelets 355  150 - 400 K/uL  BASIC METABOLIC PANEL     Status: Abnormal   Collection Time    10/14/13  8:05 AM      Result Value Ref Range   Sodium 141  137 - 147 mEq/L   Potassium 4.5  3.7 - 5.3 mEq/L   Chloride 101  96 - 112 mEq/L   CO2 27  19 - 32 mEq/L   Glucose, Bld 146 (*) 70 - 99 mg/dL   BUN 24  (*) 6 - 23 mg/dL   Creatinine, Ser 0.96  0.50 - 1.35 mg/dL   Calcium 9.7  8.4 - 10.5 mg/dL   GFR calc non Af Amer >90  >90 mL/min   GFR calc Af Amer >90  >90 mL/min   Comment: (NOTE)     The eGFR has been calculated using the CKD EPI equation.     This calculation has not been validated in all clinical situations.     eGFR's persistently <90 mL/min signify possible Chronic Kidney     Disease.  GLUCOSE, CAPILLARY     Status: Abnormal   Collection Time    10/14/13 11:17 AM      Result Value Ref Range   Glucose-Capillary 163 (*) 70 -  99 mg/dL   Comment 1 Notify RN    GLUCOSE, CAPILLARY     Status: None   Collection Time    10/14/13  4:59 PM      Result Value Ref Range   Glucose-Capillary 95  70 - 99 mg/dL  GLUCOSE, CAPILLARY     Status: Abnormal   Collection Time    10/14/13  9:16 PM      Result Value Ref Range   Glucose-Capillary 110 (*) 70 - 99 mg/dL  GLUCOSE, CAPILLARY     Status: Abnormal   Collection Time    10/15/13  7:24 AM      Result Value Ref Range   Glucose-Capillary 105 (*) 70 - 99 mg/dL   Comment 1 Notify RN    GLUCOSE, CAPILLARY     Status: Abnormal   Collection Time    10/15/13 11:30 AM      Result Value Ref Range   Glucose-Capillary 130 (*) 70 - 99 mg/dL   Comment 1 Notify RN    GLUCOSE, CAPILLARY     Status: Abnormal   Collection Time    10/15/13  4:31 PM      Result Value Ref Range   Glucose-Capillary 175 (*) 70 - 99 mg/dL  GLUCOSE, CAPILLARY     Status: Abnormal   Collection Time    10/15/13  9:33 PM      Result Value Ref Range   Glucose-Capillary 123 (*) 70 - 99 mg/dL  GLUCOSE, CAPILLARY     Status: Abnormal   Collection Time    10/16/13  7:19 AM      Result Value Ref Range   Glucose-Capillary 147 (*) 70 - 99 mg/dL   Comment 1 Notify RN        HENT: dentition poor  Head: Normocephalic.  Eyes: EOM are normal.  Cor RRR no murmur Abd soft NT Lungs clear Shoulder , elbow, wrist And hand ROM normal  Neurological: He is alert and oriented to  person, place, and time.  5/5 strength Bilateral delt, bi, tri, grip,  4/5 Bilateral HF and R KE  3- R ankle DF/PF Stump healing well 1+ edema R toes 1,4,5 dry gangrene Ext no calf swelling or tenderness  Assessment/Plan: 1. Functional deficits secondary to Left BKA for gangrene (diabetic neuropathy, undiagnosed DM, no medical care) which require 3+ hours per day of interdisciplinary therapy in a comprehensive inpatient rehab setting. Physiatrist is providing close team supervision and 24 hour management of active medical problems listed below. Physiatrist and rehab team continue to assess barriers to discharge/monitor patient progress toward functional and medical goals. Team conference today please see physician documentation under team conference tab, met with team face-to-face to discuss problems,progress, and goals. Formulized individual treatment plan based on medical history, underlying problem and comorbidities. FIM: FIM - Bathing Bathing Steps Patient Completed: Chest;Right Arm;Left Arm;Abdomen;Front perineal area;Buttocks;Right upper leg;Left upper leg Bathing: 5: Set-up assist to: Obtain items  FIM - Upper Body Dressing/Undressing Upper body dressing/undressing steps patient completed: Thread/unthread right sleeve of pullover shirt/dresss;Thread/unthread left sleeve of pullover shirt/dress;Put head through opening of pull over shirt/dress;Pull shirt over trunk Upper body dressing/undressing: 7: Complete Independence: No helper FIM - Lower Body Dressing/Undressing Lower body dressing/undressing steps patient completed: Thread/unthread right pants leg;Thread/unthread left pants leg;Pull pants up/down;Don/Doff right shoe;Fasten/unfasten right shoe Lower body dressing/undressing: 6: More than reasonable amount of time  FIM - Toileting Toileting steps completed by patient: Adjust clothing prior to toileting;Performs perineal hygiene;Adjust clothing after toileting [place]  Devices: Grab bar or rail for support Toileting: 6: Assistive device: No helper  FIM - Radio producer Devices: Product manager Transfers: 6-To toilet/ BSC;6-From toilet/BSC  FIM - Control and instrumentation engineer Devices: Environmental consultant;Arm rests Bed/Chair Transfer: 6: Supine > Sit: No assist;6: Sit > Supine: No assist;6: Bed > Chair or W/C: No assist;6: Chair or W/C > Bed: No assist  FIM - Locomotion: Wheelchair Distance: 150 Locomotion: Wheelchair: 6: Travels 150 ft or more, turns around, maneuvers to table, bed or toilet, negotiates 3% grade: maneuvers on rugs and over door sills independently FIM - Locomotion: Ambulation Locomotion: Ambulation Assistive Devices: Administrator Ambulation/Gait Assistance: 5: Supervision Locomotion: Ambulation: 1: Travels less than 50 ft with supervision/safety issues  Comprehension Comprehension Mode: Auditory Comprehension: 6-Follows complex conversation/direction: With extra time/assistive device  Expression Expression Mode: Verbal Expression: 4-Expresses basic 75 - 89% of the time/requires cueing 10 - 24% of the time. Needs helper to occlude trach/needs to repeat words.  Social Interaction Social Interaction: 6-Interacts appropriately with others with medication or extra time (anti-anxiety, antidepressant).  Problem Solving Problem Solving: 6-Solves complex problems: With extra time  Memory Memory: 6-More than reasonable amt of time  Medical Problem List and Plan:  Left BKA, and R necrotic toes  1. DVT Prophylaxis/Anticoagulation:   SQ lovenox.  2. Pain Management: Prn oxcycodone.  3. Mood: Does not express any signs of distress or concerns. LCSW to follow for evaluation.  4. Neuropsych: This patient is capable of making decisions on his own behalf.  5. DM type 2 controlled: Cont current regimen, titrate as indicated.    6. Anemia , guaic - x 1 neg, last Hgb 11.4 remains with mild tachy,  monitor  7.  Gangrene toes, great toe demarcating, auto amputation expected, will keep dry during shower, no sign of infection 8.  Orthostatic Hypotension check BMET and CBC, d/c metoprolol  LOS (Days) 33 A FACE TO FACE EVALUATION WAS PERFORMED  KIRSTEINS,ANDREW E 10/16/2013, 7:41 AM

## 2013-10-16 NOTE — Progress Notes (Signed)
Social Work Patient ID: [name], male   DOB: [birth year], 58 y.o.   MRN: [id number] [place] with [place]-Med[place] worker who reports awaiting disability which was just completed on rehab admission.  So it will be awhile longer. Will meet with pt today to discuss plan and see if he can come up with any options.

## 2013-10-16 NOTE — Progress Notes (Signed)
Social Work Patient ID: Delmas Kalkowski, male   DOB: [birth year], 58 y.o.   MRN: [id number] [facility] came and interviewed pt and will let worker know if able to accept pt.  Other facilities have called and  Spoke with this worker asking questions.  Pt fully aware and anxious to have one take him.

## 2013-10-16 NOTE — Patient Care Conference (Signed)
Inpatient RehabilitationTeam Conference and Plan of Care Update Date: 10/16/2013   Time: 10;45 AM    Patient Name: [name]      Medical Record Number: [id number]  Date of Birth: [birth year] Sex: Male         Room/Bed: 4M04C/4M04C-01 Payor Info: Payor: /    Admitting Diagnosis: L BKA  Admit Date/Time:  09/13/2013  4:36 PM Admission Comments: No comment available   Primary Diagnosis:  Unilateral complete BKA Principal Problem: Unilateral complete BKA  Patient Active Problem List   Diagnosis Date Noted  . Gangrene of toe 09/16/2013  . Fever with chills 09/16/2013  . Unilateral complete BKA 09/13/2013  . DKA (diabetic ketoacidoses) 09/04/2013  . 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 DM (diabetes mellitus) type II controlled, neurological manifestation 09/01/2013    Expected Discharge Date:    Team Members Present: Physician leading conference: Dr. Alysia Penna Social Worker Present: Ovidio Kin, LCSW;Jenny Prevatt, LCSW Nurse Present: Dorthula Nettles, RN PT Present: Raylene Everts, PT OT Present: Forde Radon, Dorothyann Gibbs, OT SLP Present: Germain Osgood, SLP PPS Coordinator present : Daiva Nakayama, RN, CRRN     Current Status/Progress Goal Weekly Team Focus  Medical   diabetes controlled, pain controlled  maintain med stability  monitor status   Bowel/Bladder   continent of bowel and bladder LBM 10/15/13  remain continent of bowel and bladder  continue to monitor q shift   Swallow/Nutrition/ Hydration     WFL        ADL's     DC from OT        Mobility   Mod I overall; supervision gait and min A stairs  Will likely D/C goals met  WB through L knee, dynamic balance, gait   Communication     [facility]        Safety/Cognition/ Behavioral Observations  n/a         Pain   pt has no complaints of pain  remain free of pain      Skin   Left BKA stump shrinker in place. No drainage noted. Right foot 1st, 2nd, 5th, toe necrotic and  craking. Heel cracking-eucerin applied to area  no new skin breakdown  assess skin q shift and PRN for any new skin issues      *See Care Plan and progress notes for long and short-term goals.  Barriers to Discharge: no d/c dispo, no insurance    Possible Resolutions to Barriers:  cont SW efforts    Discharge Planning/Teaching Needs:  No bed offers for pt, exhausting therapies-dc from OT.      Team Discussion:  Family care homes have met with pt and interviewed him.  Still looking for a RH bed  Revisions to Treatment Plan:  PT to dc him today   Continued Need for Acute Rehabilitation Level of Care: The patient requires daily medical management by a physician with specialized training in physical medicine and rehabilitation for the following conditions: Daily direction of a multidisciplinary physical rehabilitation program to ensure safe treatment while eliciting the highest outcome that is of practical value to the patient.: Yes Daily medical management of patient stability for increased activity during participation in an intensive rehabilitation regime.: Yes Daily analysis of laboratory values and/or radiology reports with any subsequent need for medication adjustment of medical intervention for : Neurological problems;Post surgical problems  [name], [name] 10/16/2013, 2:58 PM

## 2013-10-17 LAB — GLUCOSE, CAPILLARY
GLUCOSE-CAPILLARY: 127 mg/dL — AB (ref 70–99)
Glucose-Capillary: 176 mg/dL — ABNORMAL HIGH (ref 70–99)
Glucose-Capillary: 134 mg/dL — ABNORMAL HIGH (ref 70–99)
Glucose-Capillary: 94 mg/dL (ref 70–99)

## 2013-10-17 NOTE — Progress Notes (Signed)
Social Work Patient ID: [name], male   DOB: 1956-07-15, 58 y.o.   MRN: [id number] Pt requires a lightweight wheelchair due to unable to self propel a standard wheelchair.  He also uses for his self care tasks.

## 2013-10-17 NOTE — Progress Notes (Signed)
Social Work Patient ID: [name], male   DOB: 1956-03-24, 58 y.o.   MRN: [id number] [facility] contacted worker to come and emet with pt for possible admission.  Aware his Medicaid has not been approved yet and would Be a LOG.  Pt is aware of visit. Continue to work on [facility] placement.

## 2013-10-18 DIAGNOSIS — I739 Peripheral vascular disease, unspecified: Secondary | ICD-10-CM

## 2013-10-18 DIAGNOSIS — E1310 Other specified diabetes mellitus with ketoacidosis without coma: Secondary | ICD-10-CM

## 2013-10-18 DIAGNOSIS — M726 Necrotizing fasciitis: Secondary | ICD-10-CM

## 2013-10-18 DIAGNOSIS — L02619 Cutaneous abscess of unspecified foot: Secondary | ICD-10-CM

## 2013-10-18 DIAGNOSIS — S88119A Complete traumatic amputation at level between knee and ankle, unspecified lower leg, initial encounter: Secondary | ICD-10-CM

## 2013-10-18 DIAGNOSIS — L03119 Cellulitis of unspecified part of limb: Secondary | ICD-10-CM

## 2013-10-18 DIAGNOSIS — L98499 Non-pressure chronic ulcer of skin of other sites with unspecified severity: Secondary | ICD-10-CM

## 2013-10-18 LAB — GLUCOSE, CAPILLARY
GLUCOSE-CAPILLARY: 123 mg/dL — AB (ref 70–99)
Glucose-Capillary: 130 mg/dL — ABNORMAL HIGH (ref 70–99)
Glucose-Capillary: 123 mg/dL — ABNORMAL HIGH (ref 70–99)
Glucose-Capillary: 77 mg/dL (ref 70–99)

## 2013-10-18 NOTE — Progress Notes (Signed)
Patient ID: [name], male   DOB: 1956/02/19, 58 y.o.   MRN: [id number] Subjective/Complaints: No foot or stump pain Discussed auto amputation  Review of Systems - no pains, no nausea or vomiting  Objective: Vital Signs: Blood pressure 103/68, pulse 113, temperature 98.9 F (37.2 C), temperature source Oral, resp. rate 19, height 5\' 3"  (1.6 m), weight 65.772 kg (145 lb), SpO2 96.00%. No results found. Results for orders placed during the hospital encounter of 09/13/13 (from the past 72 hour(s))  GLUCOSE, CAPILLARY     Status: Abnormal   Collection Time    10/15/13  7:24 AM      Result Value Ref Range   Glucose-Capillary 105 (*) 70 - 99 mg/dL   Comment 1 Notify RN    GLUCOSE, CAPILLARY     Status: Abnormal   Collection Time    10/15/13 11:30 AM      Result Value Ref Range   Glucose-Capillary 130 (*) 70 - 99 mg/dL   Comment 1 Notify RN    GLUCOSE, CAPILLARY     Status: Abnormal   Collection Time    10/15/13  4:31 PM      Result Value Ref Range   Glucose-Capillary 175 (*) 70 - 99 mg/dL  GLUCOSE, CAPILLARY     Status: Abnormal   Collection Time    10/15/13  9:33 PM      Result Value Ref Range   Glucose-Capillary 123 (*) 70 - 99 mg/dL  GLUCOSE, CAPILLARY     Status: Abnormal   Collection Time    10/16/13  7:19 AM      Result Value Ref Range   Glucose-Capillary 147 (*) 70 - 99 mg/dL   Comment 1 Notify RN    GLUCOSE, CAPILLARY     Status: Abnormal   Collection Time    10/16/13 11:21 AM      Result Value Ref Range   Glucose-Capillary 129 (*) 70 - 99 mg/dL   Comment 1 Notify RN    GLUCOSE, CAPILLARY     Status: Abnormal   Collection Time    10/16/13  4:45 PM      Result Value Ref Range   Glucose-Capillary 103 (*) 70 - 99 mg/dL   Comment 1 Notify RN    GLUCOSE, CAPILLARY     Status: None   Collection Time    10/16/13  8:51 PM      Result Value Ref Range   Glucose-Capillary 81  70 - 99 mg/dL  GLUCOSE, CAPILLARY     Status: Abnormal   Collection Time    10/17/13   7:19 AM      Result Value Ref Range   Glucose-Capillary 127 (*) 70 - 99 mg/dL   Comment 1 Notify RN    GLUCOSE, CAPILLARY     Status: Abnormal   Collection Time    10/17/13 11:09 AM      Result Value Ref Range   Glucose-Capillary 176 (*) 70 - 99 mg/dL   Comment 1 Notify RN    GLUCOSE, CAPILLARY     Status: Abnormal   Collection Time    10/17/13  5:01 PM      Result Value Ref Range   Glucose-Capillary 134 (*) 70 - 99 mg/dL  GLUCOSE, CAPILLARY     Status: None   Collection Time    10/17/13  8:28 PM      Result Value Ref Range   Glucose-Capillary 94  70 - 99 mg/dL   Comment 1 Notify  RN        HENT: dentition poor  Head: Normocephalic.  Eyes: EOM are normal.  Cor RRR no murmur Abd soft NT Lungs clear Shoulder , elbow, wrist And hand ROM normal  Neurological: He is alert and oriented to person, place, and time.  5/5 strength Bilateral delt, bi, tri, grip,  4/5 Bilateral HF and R KE  3- R ankle DF/PF Stump healing well 1+ edema R toes 1,4,5 dry gangrene Ext no calf swelling or tenderness  Assessment/Plan: 1. Functional deficits secondary to Left BKA for gangrene (diabetic neuropathy, undiagnosed DM, no medical care) which require 3+ hours per day of interdisciplinary therapy in a comprehensive inpatient rehab setting. Physiatrist is providing close team supervision and 24 hour management of active medical problems listed below. Physiatrist and rehab team continue to assess barriers to discharge/monitor patient progress toward functional and medical goals. Stable for D/C to rest home level at any time FIM: FIM - Bathing Bathing Steps Patient Completed: Chest;Right Arm;Left Arm;Abdomen;Front perineal area;Buttocks;Right upper leg;Left upper leg Bathing: 5: Set-up assist to: Obtain items  FIM - Upper Body Dressing/Undressing Upper body dressing/undressing steps patient completed: Thread/unthread right sleeve of pullover shirt/dresss;Thread/unthread left sleeve of pullover  shirt/dress;Put head through opening of pull over shirt/dress;Pull shirt over trunk Upper body dressing/undressing: 7: Complete Independence: No helper FIM - Lower Body Dressing/Undressing Lower body dressing/undressing steps patient completed: Thread/unthread right pants leg;Thread/unthread left pants leg;Pull pants up/down;Don/Doff right shoe;Fasten/unfasten right shoe Lower body dressing/undressing: 6: More than reasonable amount of time  FIM - Toileting Toileting steps completed by patient: Adjust clothing prior to toileting;Performs perineal hygiene;Adjust clothing after toileting Toileting Assistive Devices: Grab bar or rail for support Toileting: 6: Assistive device: No helper  FIM - Radio producer Devices: Product manager Transfers: 6-To toilet/ BSC;6-From toilet/BSC  FIM - Control and instrumentation engineer Devices: Environmental consultant;Arm rests Bed/Chair Transfer: 6: Supine > Sit: No assist;6: Sit > Supine: No assist;6: Bed > Chair or W/C: No assist;6: Chair or W/C > Bed: No assist  FIM - Locomotion: Wheelchair Distance: 150 Locomotion: Wheelchair: 6: Travels 150 ft or more, turns around, maneuvers to table, bed or toilet, negotiates 3% grade: maneuvers on rugs and over door sills independently FIM - Locomotion: Ambulation Locomotion: Ambulation Assistive Devices: Administrator Ambulation/Gait Assistance: 5: Supervision Locomotion: Ambulation: 0: Activity did not occur  Comprehension Comprehension Mode: Auditory Comprehension: 6-Follows complex conversation/direction: With extra time/assistive device  Expression Expression Mode: Verbal Expression: 6-Expresses complex ideas: With extra time/assistive device  Social Interaction Social Interaction: 7-Interacts appropriately with others - No medications needed.  Problem Solving Problem Solving: 7-Solves complex problems: Recognizes & self-corrects  Memory Memory: 7-Complete  Independence: No helper  Medical Problem List and Plan:  Left BKA, and R necrotic toes  1. DVT Prophylaxis/Anticoagulation:   SQ lovenox.  2. Pain Management: Prn oxcycodone.  3. Mood: Does not express any signs of distress or concerns. LCSW to follow for evaluation.  4. Neuropsych: This patient is capable of making decisions on his own behalf.  5. DM type 2 controlled: Cont current regimen, titrate as indicated.    6. Anemia , guaic - x 1 neg, last Hgb 11.4 remains with mild tachy, monitor, may d/c Fe as Hgb stabilized  7.  Gangrene toes, great toe demarcating, auto amputation expected, will keep dry during shower, no sign of infection 8.  Orthostatic Hypotension check BMET and CBC, d/c metoprolol  LOS (Days) 35 A FACE TO FACE EVALUATION WAS PERFORMED  Charlett Blake 10/18/2013, 6:46 AM

## 2013-10-18 NOTE — Progress Notes (Signed)
Social Work Patient ID: [name], male   DOB: [birth year], 58 y.o.   MRN: [id number] Met with pt to inform of places [facility] have spoken with [facility], has not come like they said but awaiting return call from. [facility] received information and will let this worker know if can meet his needs.  Other facility [facility] are considering and will Let this worker know.  Pt is agreeable and will await word.  [facility] which came to see pt has decided not to accept him, made pt aware. Will continue to work on obtaining a bed for pt.

## 2013-10-19 DIAGNOSIS — E1310 Other specified diabetes mellitus with ketoacidosis without coma: Secondary | ICD-10-CM

## 2013-10-19 DIAGNOSIS — L98499 Non-pressure chronic ulcer of skin of other sites with unspecified severity: Secondary | ICD-10-CM

## 2013-10-19 DIAGNOSIS — L02619 Cutaneous abscess of unspecified foot: Secondary | ICD-10-CM

## 2013-10-19 DIAGNOSIS — S88119A Complete traumatic amputation at level between knee and ankle, unspecified lower leg, initial encounter: Secondary | ICD-10-CM

## 2013-10-19 DIAGNOSIS — I739 Peripheral vascular disease, unspecified: Secondary | ICD-10-CM

## 2013-10-19 DIAGNOSIS — M726 Necrotizing fasciitis: Secondary | ICD-10-CM

## 2013-10-19 DIAGNOSIS — L03119 Cellulitis of unspecified part of limb: Secondary | ICD-10-CM

## 2013-10-19 LAB — GLUCOSE, CAPILLARY
GLUCOSE-CAPILLARY: 150 mg/dL — AB (ref 70–99)
Glucose-Capillary: 140 mg/dL — ABNORMAL HIGH (ref 70–99)
Glucose-Capillary: 177 mg/dL — ABNORMAL HIGH (ref 70–99)
Glucose-Capillary: 60 mg/dL — ABNORMAL LOW (ref 70–99)
Glucose-Capillary: 83 mg/dL (ref 70–99)

## 2013-10-19 NOTE — Progress Notes (Signed)
Patient ID: [name], male   DOB: 1956/03/28, 58 y.o.   MRN: [id number] Subjective/Complaints: No new problems. Eating well.  Review of Systems - no pains, no nausea or vomiting   Objective: Vital Signs: Blood pressure 112/71, pulse 110, temperature 98.6 F (37 C), temperature source Oral, resp. rate 18, height 5\' 3"  (1.6 m), weight 65.772 kg (145 lb), SpO2 98.00%. No results found. Results for orders placed during the hospital encounter of 09/13/13 (from the past 72 hour(s))  GLUCOSE, CAPILLARY     Status: Abnormal   Collection Time    10/16/13 11:21 AM      Result Value Ref Range   Glucose-Capillary 129 (*) 70 - 99 mg/dL   Comment 1 Notify RN    GLUCOSE, CAPILLARY     Status: Abnormal   Collection Time    10/16/13  4:45 PM      Result Value Ref Range   Glucose-Capillary 103 (*) 70 - 99 mg/dL   Comment 1 Notify RN    GLUCOSE, CAPILLARY     Status: None   Collection Time    10/16/13  8:51 PM      Result Value Ref Range   Glucose-Capillary 81  70 - 99 mg/dL  GLUCOSE, CAPILLARY     Status: Abnormal   Collection Time    10/17/13  7:19 AM      Result Value Ref Range   Glucose-Capillary 127 (*) 70 - 99 mg/dL   Comment 1 Notify RN    GLUCOSE, CAPILLARY     Status: Abnormal   Collection Time    10/17/13 11:09 AM      Result Value Ref Range   Glucose-Capillary 176 (*) 70 - 99 mg/dL   Comment 1 Notify RN    GLUCOSE, CAPILLARY     Status: Abnormal   Collection Time    10/17/13  5:01 PM      Result Value Ref Range   Glucose-Capillary 134 (*) 70 - 99 mg/dL  GLUCOSE, CAPILLARY     Status: None   Collection Time    10/17/13  8:28 PM      Result Value Ref Range   Glucose-Capillary 94  70 - 99 mg/dL   Comment 1 Notify RN    GLUCOSE, CAPILLARY     Status: Abnormal   Collection Time    10/18/13  7:30 AM      Result Value Ref Range   Glucose-Capillary 130 (*) 70 - 99 mg/dL   Comment 1 Notify RN    GLUCOSE, CAPILLARY     Status: Abnormal   Collection Time    10/18/13 11:37  AM      Result Value Ref Range   Glucose-Capillary 123 (*) 70 - 99 mg/dL   Comment 1 Notify RN    GLUCOSE, CAPILLARY     Status: Abnormal   Collection Time    10/18/13  4:40 PM      Result Value Ref Range   Glucose-Capillary 123 (*) 70 - 99 mg/dL  GLUCOSE, CAPILLARY     Status: None   Collection Time    10/18/13  9:01 PM      Result Value Ref Range   Glucose-Capillary 77  70 - 99 mg/dL  GLUCOSE, CAPILLARY     Status: Abnormal   Collection Time    10/19/13  7:30 AM      Result Value Ref Range   Glucose-Capillary 150 (*) 70 - 99 mg/dL   Comment 1 Notify RN  HENT: dentition poor  Head: Normocephalic.  Eyes: EOM are normal.  Cor RRR no murmur Abd soft NT Lungs clear Shoulder , elbow, wrist And hand ROM normal  Neurological: He is alert and oriented to person, place, and time.  5/5 strength Bilateral delt, bi, tri, grip,  4/5 Bilateral HF and R KE  3- R ankle DF/PF Stump healing well 1+ edema R toes 1,4,5 dry gangrene Ext no calf swelling or tenderness  Assessment/Plan: 1. Functional deficits secondary to Left BKA for gangrene (diabetic neuropathy, undiagnosed DM, no medical care) which require 3+ hours per day of interdisciplinary therapy in a comprehensive inpatient rehab setting. Physiatrist is providing close team supervision and 24 hour management of active medical problems listed below. Physiatrist and rehab team continue to assess barriers to discharge/monitor patient progress toward functional and medical goals. Stable for D/C to rest home level at any time FIM: FIM - Bathing Bathing Steps Patient Completed: Chest;Right Arm;Left Arm;Abdomen;Front perineal area;Buttocks;Right upper leg;Left upper leg Bathing: 5: Set-up assist to: Obtain items  FIM - Upper Body Dressing/Undressing Upper body dressing/undressing steps patient completed: Thread/unthread right sleeve of pullover shirt/dresss;Thread/unthread left sleeve of pullover shirt/dress;Put head through  opening of pull over shirt/dress;Pull shirt over trunk Upper body dressing/undressing: 7: Complete Independence: No helper FIM - Lower Body Dressing/Undressing Lower body dressing/undressing steps patient completed: Thread/unthread right pants leg;Thread/unthread left pants leg;Pull pants up/down;Don/Doff right shoe;Fasten/unfasten right shoe Lower body dressing/undressing: 6: More than reasonable amount of time  FIM - Toileting Toileting steps completed by patient: Adjust clothing prior to toileting;Performs perineal hygiene;Adjust clothing after toileting Toileting Assistive Devices: Grab bar or rail for support Toileting: 6: Assistive device: No helper  FIM - Radio producer Devices: Product manager Transfers: 6-To toilet/ BSC;6-From toilet/BSC  FIM - Control and instrumentation engineer Devices: Environmental consultant;Arm rests Bed/Chair Transfer: 6: Supine > Sit: No assist;6: Sit > Supine: No assist;6: Bed > Chair or W/C: No assist;6: Chair or W/C > Bed: No assist  FIM - Locomotion: Wheelchair Distance: 150 Locomotion: Wheelchair: 6: Travels 150 ft or more, turns around, maneuvers to table, bed or toilet, negotiates 3% grade: maneuvers on rugs and over door sills independently FIM - Locomotion: Ambulation Locomotion: Ambulation Assistive Devices: Administrator Ambulation/Gait Assistance: 5: Supervision Locomotion: Ambulation: 0: Activity did not occur  Comprehension Comprehension Mode: Auditory Comprehension: 6-Follows complex conversation/direction: With extra time/assistive device  Expression Expression Mode: Verbal Expression: 6-Expresses complex ideas: With extra time/assistive device  Social Interaction Social Interaction: 7-Interacts appropriately with others - No medications needed.  Problem Solving Problem Solving: 7-Solves complex problems: Recognizes & self-corrects  Memory Memory: 7-Complete Independence: No helper  Medical  Problem List and Plan:  Left BKA, and R necrotic toes  1. DVT Prophylaxis/Anticoagulation:   SQ lovenox.  2. Pain Management: Prn oxcycodone.  3. Mood: Does not express any signs of distress or concerns. LCSW to follow for evaluation.  4. Neuropsych: This patient is capable of making decisions on his own behalf.  5. DM type 2 controlled: Cont current regimen, titrate as indicated.    6. Anemia , guaic - x 1 neg, last Hgb 11.4 remains with mild tachy, monitor, may d/c Fe as Hgb stabilized  7.  Gangrene toes, great toe demarcating, auto amputation expected, will keep dry during shower, no sign of infection 8.  Orthostatic Hypotension, push po, off metoprolol  LOS (Days) 36 A FACE TO FACE EVALUATION WAS PERFORMED  SWARTZ,ZACHARY T 10/19/2013, 7:51 AM

## 2013-10-20 LAB — GLUCOSE, CAPILLARY
GLUCOSE-CAPILLARY: 138 mg/dL — AB (ref 70–99)
GLUCOSE-CAPILLARY: 86 mg/dL (ref 70–99)
Glucose-Capillary: 147 mg/dL — ABNORMAL HIGH (ref 70–99)
Glucose-Capillary: 133 mg/dL — ABNORMAL HIGH (ref 70–99)

## 2013-10-20 NOTE — Progress Notes (Signed)
Hypoglycemic Event  CBG: 60  Treatment: 15 GM carbohydrate snack  Symptoms: None  Follow-up CBG: Time:2118 CBG Result:83  Possible Reasons for Event: Unknown  Comments/MD notified MD not notified at this time.    [name] A  Remember to initiate Hypoglycemia Order Set & complete

## 2013-10-21 DIAGNOSIS — L98499 Non-pressure chronic ulcer of skin of other sites with unspecified severity: Secondary | ICD-10-CM

## 2013-10-21 DIAGNOSIS — L02619 Cutaneous abscess of unspecified foot: Secondary | ICD-10-CM

## 2013-10-21 DIAGNOSIS — S88119A Complete traumatic amputation at level between knee and ankle, unspecified lower leg, initial encounter: Secondary | ICD-10-CM

## 2013-10-21 DIAGNOSIS — L03119 Cellulitis of unspecified part of limb: Secondary | ICD-10-CM

## 2013-10-21 DIAGNOSIS — I739 Peripheral vascular disease, unspecified: Secondary | ICD-10-CM

## 2013-10-21 DIAGNOSIS — E1310 Other specified diabetes mellitus with ketoacidosis without coma: Secondary | ICD-10-CM

## 2013-10-21 DIAGNOSIS — M726 Necrotizing fasciitis: Secondary | ICD-10-CM

## 2013-10-21 LAB — GLUCOSE, CAPILLARY
GLUCOSE-CAPILLARY: 152 mg/dL — AB (ref 70–99)
Glucose-Capillary: 134 mg/dL — ABNORMAL HIGH (ref 70–99)
Glucose-Capillary: 194 mg/dL — ABNORMAL HIGH (ref 70–99)
Glucose-Capillary: 93 mg/dL (ref 70–99)

## 2013-10-21 NOTE — Progress Notes (Signed)
Social Work Patient ID: [name], male   DOB: 06-28-1956, 58 y.o.   MRN: [id number] Spoke with [facility] she is coming to see pt today, she is considering taking pt. Pt is looking forward to her visit.  He would like to stay closer to home rather than go far away.  Spoke with [facility] they  Declined taking pt.  [facility] considering pt.

## 2013-10-21 NOTE — Progress Notes (Signed)
Patient ID: [name], male   DOB: [birth year], 58 y.o.   MRN: [id number] Subjective/Complaints: No new problems. Eating well.  Review of Systems - no pains, no nausea or vomiting   Objective: Vital Signs: Blood pressure 95/50, pulse 116, temperature 98.2 F (36.8 C), temperature source Oral, resp. rate 19, height 5\' 3"  (1.6 m), weight 65.772 kg (145 lb), SpO2 100.00%. No results found. Results for orders placed during the hospital encounter of 09/13/13 (from the past 72 hour(s))  GLUCOSE, CAPILLARY     Status: Abnormal   Collection Time    10/18/13  7:30 AM      Result Value Ref Range   Glucose-Capillary 130 (*) 70 - 99 mg/dL   Comment 1 Notify RN    GLUCOSE, CAPILLARY     Status: Abnormal   Collection Time    10/18/13 11:37 AM      Result Value Ref Range   Glucose-Capillary 123 (*) 70 - 99 mg/dL   Comment 1 Notify RN    GLUCOSE, CAPILLARY     Status: Abnormal   Collection Time    10/18/13  4:40 PM      Result Value Ref Range   Glucose-Capillary 123 (*) 70 - 99 mg/dL  GLUCOSE, CAPILLARY     Status: None   Collection Time    10/18/13  9:01 PM      Result Value Ref Range   Glucose-Capillary 77  70 - 99 mg/dL  GLUCOSE, CAPILLARY     Status: Abnormal   Collection Time    10/19/13  7:30 AM      Result Value Ref Range   Glucose-Capillary 150 (*) 70 - 99 mg/dL   Comment 1 Notify RN    GLUCOSE, CAPILLARY     Status: Abnormal   Collection Time    10/19/13 11:44 AM      Result Value Ref Range   Glucose-Capillary 140 (*) 70 - 99 mg/dL   Comment 1 Notify RN    GLUCOSE, CAPILLARY     Status: Abnormal   Collection Time    10/19/13  4:52 PM      Result Value Ref Range   Glucose-Capillary 177 (*) 70 - 99 mg/dL   Comment 1 Notify RN    GLUCOSE, CAPILLARY     Status: Abnormal   Collection Time    10/19/13  8:39 PM      Result Value Ref Range   Glucose-Capillary 60 (*) 70 - 99 mg/dL  GLUCOSE, CAPILLARY     Status: None   Collection Time    10/19/13  9:18 PM      Result Value  Ref Range   Glucose-Capillary 83  70 - 99 mg/dL  GLUCOSE, CAPILLARY     Status: Abnormal   Collection Time    10/20/13  7:28 AM      Result Value Ref Range   Glucose-Capillary 147 (*) 70 - 99 mg/dL  GLUCOSE, CAPILLARY     Status: Abnormal   Collection Time    10/20/13 11:09 AM      Result Value Ref Range   Glucose-Capillary 133 (*) 70 - 99 mg/dL  GLUCOSE, CAPILLARY     Status: Abnormal   Collection Time    10/20/13  4:57 PM      Result Value Ref Range   Glucose-Capillary 138 (*) 70 - 99 mg/dL  GLUCOSE, CAPILLARY     Status: None   Collection Time    10/20/13  8:43 PM  Result Value Ref Range   Glucose-Capillary 86  70 - 99 mg/dL   Comment 1 Notify RN        HENT: dentition poor  Head: Normocephalic.  Eyes: EOM are normal.  Cor RRR no murmur Abd soft NT Lungs clear Shoulder , elbow, wrist And hand ROM normal  Neurological: He is alert and oriented to person, place, and time.  5/5 strength Bilateral delt, bi, tri, grip,  4/5 Bilateral HF and R KE  3- R ankle DF/PF Stump healing well 1+ edema R toes 1,4,5 dry gangrene Ext no calf swelling or tenderness  Assessment/Plan: 1. Functional deficits secondary to Left BKA for gangrene (diabetic neuropathy, undiagnosed DM, no medical care) which require 3+ hours per day of interdisciplinary therapy in a comprehensive inpatient rehab setting. Physiatrist is providing close team supervision and 24 hour management of active medical problems listed below. Physiatrist and rehab team continue to assess barriers to discharge/monitor patient progress toward functional and medical goals. Stable for D/C to rest home level at any time FIM: FIM - Bathing Bathing Steps Patient Completed: Chest;Right Arm;Left Arm;Abdomen;Front perineal area;Buttocks;Right upper leg;Left upper leg Bathing: 5: Set-up assist to: Obtain items  FIM - Upper Body Dressing/Undressing Upper body dressing/undressing steps patient completed: Thread/unthread right  sleeve of pullover shirt/dresss;Thread/unthread left sleeve of pullover shirt/dress;Put head through opening of pull over shirt/dress;Pull shirt over trunk Upper body dressing/undressing: 7: Complete Independence: No helper FIM - Lower Body Dressing/Undressing Lower body dressing/undressing steps patient completed: Thread/unthread right pants leg;Thread/unthread left pants leg;Pull pants up/down;Don/Doff right shoe;Fasten/unfasten right shoe Lower body dressing/undressing: 6: More than reasonable amount of time  FIM - Toileting Toileting steps completed by patient: Adjust clothing prior to toileting;Performs perineal hygiene;Adjust clothing after toileting Toileting Assistive Devices: Grab bar or rail for support Toileting: 6: Assistive device: No helper  FIM - Radio producer Devices: Product manager Transfers: 6-To toilet/ BSC;6-From toilet/BSC  FIM - Control and instrumentation engineer Devices: Environmental consultant;Arm rests Bed/Chair Transfer: 6: Supine > Sit: No assist;6: Sit > Supine: No assist;6: Bed > Chair or W/C: No assist;6: Chair or W/C > Bed: No assist  FIM - Locomotion: Wheelchair Distance: 150 Locomotion: Wheelchair: 6: Travels 150 ft or more, turns around, maneuvers to table, bed or toilet, negotiates 3% grade: maneuvers on rugs and over door sills independently FIM - Locomotion: Ambulation Locomotion: Ambulation Assistive Devices: Administrator Ambulation/Gait Assistance: 5: Supervision Locomotion: Ambulation: 0: Activity did not occur  Comprehension Comprehension Mode: Auditory Comprehension: 6-Follows complex conversation/direction: With extra time/assistive device  Expression Expression Mode: Verbal Expression: 6-Expresses complex ideas: With extra time/assistive device  Social Interaction Social Interaction: 7-Interacts appropriately with others - No medications needed.  Problem Solving Problem Solving: 7-Solves complex  problems: Recognizes & self-corrects  Memory Memory: 7-Complete Independence: No helper  Medical Problem List and Plan:  Left BKA, and R necrotic toes  1. DVT Prophylaxis/Anticoagulation:   SQ lovenox.  2. Pain Management: Prn oxcycodone.  3. Mood: Does not express any signs of distress or concerns. LCSW to follow for evaluation.  4. Neuropsych: This patient is capable of making decisions on his own behalf.  5. DM type 2 controlled: Cont current regimen, titrate as indicated.    6. Anemia , guaic - x 1 neg, last Hgb 11.4 remains with mild tachy, monitor, may d/c Fe as Hgb stabilized  7.  Gangrene toes, great toe demarcating, auto amputation expected, will keep dry during shower, no sign of infection 8.  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

## 2013-10-22 DIAGNOSIS — E1310 Other specified diabetes mellitus with ketoacidosis without coma: Secondary | ICD-10-CM

## 2013-10-22 DIAGNOSIS — L98499 Non-pressure chronic ulcer of skin of other sites with unspecified severity: Secondary | ICD-10-CM

## 2013-10-22 DIAGNOSIS — L02619 Cutaneous abscess of unspecified foot: Secondary | ICD-10-CM

## 2013-10-22 DIAGNOSIS — M726 Necrotizing fasciitis: Secondary | ICD-10-CM

## 2013-10-22 DIAGNOSIS — L03119 Cellulitis of unspecified part of limb: Secondary | ICD-10-CM

## 2013-10-22 DIAGNOSIS — S88119A Complete traumatic amputation at level between knee and ankle, unspecified lower leg, initial encounter: Secondary | ICD-10-CM

## 2013-10-22 DIAGNOSIS — I739 Peripheral vascular disease, unspecified: Secondary | ICD-10-CM

## 2013-10-22 LAB — GLUCOSE, CAPILLARY
GLUCOSE-CAPILLARY: 157 mg/dL — AB (ref 70–99)
Glucose-Capillary: 125 mg/dL — ABNORMAL HIGH (ref 70–99)

## 2013-10-22 MED ORDER — ADULT MULTIVITAMIN W/MINERALS CH: 1.0000 | ORAL_TABLET | Freq: Every day | ORAL | Status: DC

## 2013-10-22 MED ORDER — INSULIN ASPART PROT & ASPART (70-30 MIX) 100 UNIT/ML ~~LOC~~ SUSP: 20.0000 [IU] | Freq: Two times a day (BID) | SUBCUTANEOUS | Status: DC

## 2013-10-22 MED ORDER — ASPIRIN 325 MG PO TBEC: 325.0000 mg | DELAYED_RELEASE_TABLET | Freq: Every day | ORAL | Status: DC

## 2013-10-22 NOTE — Progress Notes (Signed)
Social Work Patient ID: [name], male   DOB: [birth year], 58 y.o.   MRN: [id number] [facility] and plans to come at 12;00 to transport him to facility. Will gather paperwork and pt is aware and agreeable.  Have ordered pt's wheelchair and rolling walker to his room. Work on transfer for today.

## 2013-10-22 NOTE — Progress Notes (Signed)
Discharge summary [phone]

## 2013-10-22 NOTE — Progress Notes (Signed)
Social Work Discharge Note Discharge Note  The overall goal for the admission was met for:   Discharge location: [place]  Length of Stay: No-39 DAYS  Discharge activity level: Yes-SUPERVISION AMBULATION/MOD/I WHEELCHAIR LEVEL  Home/community participation: Yes  Services provided included: MD, RD, PT, OT, RN, CM, TR, Pharmacy and SW  Financial Services: Other: PENDING MEDICAID  Follow-up services arranged: Other: [facility] PLACEMENT  Comments (or additional information):MEDICAID & SSD PENDING-FACILITY TOOK WITH MEDICAID PENDING  Patient/Family verbalized understanding of follow-up arrangements: Yes  Individual responsible for coordination of the follow-up plan: SELF AND FACILITY  Confirmed correct DME delivered: Elease Hashimoto 10/22/2013    Elease Hashimoto

## 2013-10-22 NOTE — Discharge Summary (Signed)
NAMEJIMI, [name] NO.:  [id number]  MEDICAL RECORD NO.:  [id number]  LOCATION:  4M04C                        FACILITY:  [facility]  PHYSICIAN:  Charlett Blake, M.D.DATE OF BIRTH:  Dec 09, 1955  DATE OF ADMISSION:  09/13/2013 DATE OF DISCHARGE:  10/21/2013                              DISCHARGE SUMMARY   DISCHARGE DIAGNOSES: 1. Left below knee amputation. 2. 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 3. Diabetes mellitus type 2. 4. Anemia. 5. Orthostatic hypertension.  HISTORY OF PRESENT ILLNESS:  The patient is a 58 year old male with history of diabetes mellitus, hypertension, bilateral lower extremity numbness since frostbite last year, but no medical care.  He was admitted on September 01, 2013, with confusion, left foot infection with cellulitis and abscess formation.  He was found to be in diabetic ketoacidosis requiring insulin infusion.  He was placed on IV antibiotic and foot culture was positive for Proteus mirabilis.  MRI of foot showed concern for necrotizing fasciitis, and he was taken to OR for I and D with 4th and 5th toe amputation and placement of VAC.  He has dry necrotic toes on right 1st, 2nd, and 5th and likely allow for toe amputation.  He underwent transtibial amputation on September 11, 2013, due to poor healing.  Mentation has been improving.  He has been maintained on IV vancomycin, Zosyn, and clindamycin since admission; however, was noted to have a temp spike of 102 night prior to admission. Therapies were ongoing and CIR was recommended for progressive therapies.  PAST MEDICAL HISTORY:  Hypertension, history of pneumonia, hepatitis, DM type 2, electrolyte abnormalities, cholecystectomy.  ALLERGIES:  No known drug allergies.  SOCIAL HISTORY:  The patient lives alone in his car.  Does usually eat a meal with family at nights.  He has been unemployed for the past few years.  Does not use any tobacco, alcohol, or illicit  drugs.  HOSPITAL COURSE:  The patient was admitted to rehab on September 13, 2013, for inpatient therapies to consist of PT, OT, at least 3 hours 5 days a week.  Past admission, physiatrist, rehab, RN, and therapy team have worked together to provide customized collaborative interdisciplinary care.  The patient was pancultured past admission and right lower extremity Dopplers were done showing no evidence of DVT. Dr. Megan Salon was consulted for input and recommended observing off antibiotics as a cause of recent fevers remained unclear.  The patient has defervesced nicely.  Wound has been healing well without any signs or symptoms of infection and white count has been stable.  He was noted to be anemic at admission and did have a drop in his hemoglobin from 8.4- 7.4.  Stool guaiacs were ordered and were noted to be negative.  Dr. Penelope Coop with [facility] was consulted for input and he suspected that his anemia was due to chronic disease.  The patient was transfused with 2 units packed red blood cells with improvement.  His H and H has slowly improved with most recent check of CBC showing hemoglobin 11.3, hematocrit 34.5, white count 9.9, platelets 355.  His renal status has been monitored.  He was noted to have some prerenal azotemia with orthostasis.  He was encouraged to push p.o. fluids, check of lytes from October 14, 2013, revealed sodium 141, potassium 4.5, chloride 101, CO2 of 27, BUN 24, creatinine 0.96, glucose 146.  His diabetes has been monitored with before meals and at bedtime CBG checks and blood sugars have been reasonably controlled.  He does have occasional high once a day.  He has been continent of bowel and bladder.  His BKA site has been healing well without any signs or symptoms of infection.  The stump shrinker was ordered to help with edema control.  During the patient's stay in rehab, weekly team conferences were held to monitor the patient's progress, set goals, as well  as discuss barriers to discharge.  The patient has made good progress in therapies.  He is modified independent for bed mobility and transfers.  He is independent for wheelchair navigation.  He is able to ambulate 90-150 feet with rolling walker and supervision.  The OT has worked with the patient on ADL task as well as lower extremity strengthening.  He is modified independent for bathing at seated level.  He is modified independent for dressing.  Search was initiated for facility for the patient to be discharged to.  Bed is available at a facility in [place] and the patient is discharged on October 22, 2013, in improved condition.  DISCHARGE MEDICATIONS: 1. 70/30 insulin at 20 units with breakfast, 20 units with supper. 2. Multivitamin 1 p.o. per day. 3. Protein supplements t.i.d. 4. Eucerin cream to right lower extremity b.i.d. 5. Coated aspirin 325 mg p.o. per day. 6. Tylenol 650 mg p.o. q.4-6 hours p.r.n. pain.  DIET:  Diabetic diet.  ACTIVITY LEVEL:  As tolerated with supervision for ambulation.  SPECIAL INSTRUCTIONS:  Monitor blood sugars before meals and at bedtime basis.  Continue the stump shrinker on left BKA.  Wound care, wash area with soap and water and pat dry.  Wear cast shoe on right foot.  FOLLOWUP:  The patient to follow up with Dr. Sherrian Divers in the next 2-3 weeks. Follow up with Dr. Letta Pate in 4 weeks.     Reesa Chew, P.A.   ______________________________ Charlett Blake, M.D.    PL/MEDQ  D:  10/22/2013  T:  10/22/2013  Job:  [id number]  cc:   Dr. Sherrian Divers

## 2013-11-11 ENCOUNTER — Encounter (HOSPITAL_COMMUNITY): Payer: Self-pay | Admitting: Emergency Medicine

## 2013-11-11 ENCOUNTER — Emergency Department (HOSPITAL_COMMUNITY): Payer: Medicaid Other

## 2013-11-11 ENCOUNTER — Emergency Department (HOSPITAL_COMMUNITY)
Admission: EM | Admit: 2013-11-11 | Discharge: 2013-11-11 | Disposition: A | Discharge status: Home / Self Care | Payer: Medicaid Other | Attending: Emergency Medicine | Admitting: Emergency Medicine

## 2013-11-11 DIAGNOSIS — Z8719 Personal history of other diseases of the digestive system: Secondary | ICD-10-CM | POA: Insufficient documentation

## 2013-11-11 DIAGNOSIS — L03119 Cellulitis of unspecified part of limb: Principal | ICD-10-CM

## 2013-11-11 DIAGNOSIS — Z8701 Personal history of pneumonia (recurrent): Secondary | ICD-10-CM | POA: Insufficient documentation

## 2013-11-11 DIAGNOSIS — L03115 Cellulitis of right lower limb: Secondary | ICD-10-CM

## 2013-11-11 DIAGNOSIS — I10 Essential (primary) hypertension: Secondary | ICD-10-CM | POA: Insufficient documentation

## 2013-11-11 DIAGNOSIS — Z794 Long term (current) use of insulin: Secondary | ICD-10-CM | POA: Insufficient documentation

## 2013-11-11 DIAGNOSIS — Z79899 Other long term (current) drug therapy: Secondary | ICD-10-CM | POA: Insufficient documentation

## 2013-11-11 DIAGNOSIS — L02619 Cutaneous abscess of unspecified foot: Secondary | ICD-10-CM | POA: Insufficient documentation

## 2013-11-11 DIAGNOSIS — E119 Type 2 diabetes mellitus without complications: Secondary | ICD-10-CM | POA: Insufficient documentation

## 2013-11-11 DIAGNOSIS — Z7982 Long term (current) use of aspirin: Secondary | ICD-10-CM | POA: Insufficient documentation

## 2013-11-11 LAB — CBC WITH DIFFERENTIAL/PLATELET
Basophils Absolute: 0 10*3/uL (ref 0.0–0.1)
Basophils Relative: 0 % (ref 0–1)
Eosinophils Absolute: 0.2 10*3/uL (ref 0.0–0.7)
Eosinophils Relative: 2 % (ref 0–5)
HCT: 40.3 % (ref 39.0–52.0)
Hemoglobin: 13.7 g/dL (ref 13.0–17.0)
Lymphocytes Relative: 29 % (ref 12–46)
Lymphs Abs: 2.4 10*3/uL (ref 0.7–4.0)
MCH: 28.8 pg (ref 26.0–34.0)
MCHC: 34 g/dL (ref 30.0–36.0)
MCV: 84.8 fL (ref 78.0–100.0)
Monocytes Absolute: 0.5 10*3/uL (ref 0.1–1.0)
Monocytes Relative: 6 % (ref 3–12)
NEUTROS PCT: 63 % (ref 43–77)
Neutro Abs: 5.1 10*3/uL (ref 1.7–7.7)
PLATELETS: 216 10*3/uL (ref 150–400)
RBC: 4.75 MIL/uL (ref 4.22–5.81)
RDW: 15.9 % — ABNORMAL HIGH (ref 11.5–15.5)
WBC: 8.2 10*3/uL (ref 4.0–10.5)

## 2013-11-11 LAB — COMPREHENSIVE METABOLIC PANEL
ALBUMIN: 3.8 g/dL (ref 3.5–5.2)
ALK PHOS: 84 U/L (ref 39–117)
ALT: 12 U/L (ref 0–53)
AST: 18 U/L (ref 0–37)
BUN: 11 mg/dL (ref 6–23)
CHLORIDE: 104 meq/L (ref 96–112)
CO2: 24 mEq/L (ref 19–32)
Calcium: 10.3 mg/dL (ref 8.4–10.5)
Creatinine, Ser: 0.88 mg/dL (ref 0.50–1.35)
GFR calc non Af Amer: >90 mL/min (ref >90)
GLUCOSE: 140 mg/dL — AB (ref 70–99)
POTASSIUM: 3.8 meq/L (ref 3.7–5.3)
SODIUM: 144 meq/L (ref 137–147)
Total Bilirubin: 0.3 mg/dL (ref 0.3–1.2)
Total Protein: 8.3 g/dL (ref 6.0–8.3)

## 2013-11-11 LAB — I-STAT CG4 LACTIC ACID, ED: Lactic Acid, Venous: 1.62 mmol/L (ref 0.5–2.2)

## 2013-11-11 LAB — CBG MONITORING, ED: Glucose-Capillary: 139 mg/dL — ABNORMAL HIGH (ref 70–99)

## 2013-11-11 MED ORDER — CLINDAMYCIN PHOSPHATE 600 MG/50ML IV SOLN
600.0000 mg | Freq: Once | INTRAVENOUS | Status: AC
  Administered 2013-11-11: 600 mg via INTRAVENOUS
  Filled 2013-11-11: qty 50

## 2013-11-11 MED ORDER — DOXYCYCLINE HYCLATE 100 MG PO CAPS: 100.0000 mg | ORAL_CAPSULE | Freq: Two times a day (BID) | ORAL | Status: DC

## 2013-11-11 NOTE — Discharge Instructions (Signed)
If your swelling or redness worsens or you develop fever you to return to the ER or call your orthopedic doctor. Otherwise take the antibiotics as prescribed and follow up with your orthopedic doctor within the week.

## 2013-11-11 NOTE — ED Notes (Addendum)
Kennyth Lose (Caregiver) [phone]  **would like to be called with update, verbal consent given by patient that Kennyth Lose is able to have health info given.

## 2013-11-11 NOTE — ED Notes (Signed)
Pt in c/o increased swelling in his right foot, states his last three toes are necrotic and they have been concerned that the infection would spread, increased swelling over the last few days, denies increased pain

## 2013-11-11 NOTE — ED Provider Notes (Signed)
CSN: [id number]     Arrival date & time 11/11/13  1740 History   First MD Initiated Contact with Patient 11/11/13 2007     Chief Complaint  Patient presents with  . Foot Swelling     (Consider location/radiation/quality/duration/timing/severity/associated sxs/prior Treatment) HPI 58 year old male with history of diabetes and necrotizing fasciitis to his feet presents with swelling and erythema to his right foot over the last few days. He states he noticed it all of a sudden and since it has not been increasing or changing. He has not any fevers, pain, or vomiting. Patient states the erythema is at his toes and has not spread. He has black toes since he had surgery on his extremity 2 months ago. Since then the toes remain the same and are currently the same. There's been no drainage or swelling to his toes. The patient states he wanted to get checked out so he can avoid another infection agree had to have a BKA on the left side.  Past Medical History  Diagnosis Date  . Hepatitis   . Hypertension   . Pneumonia     HX OF PNA  . Diabetes mellitus without complication     PATIENT JUST LEARNED HE WAS DIABETIC  . Necrotizing fasciitis   . Hyponatremia   . Hypokalemia    Past Surgical History  Procedure Laterality Date  . Cholecystectomy    . I&d extremity Left 09/03/2013    Procedure: IRRIGATION AND DEBRIDEMENT EXTREMITY;  Surgeon: Marianna Payment, MD;  Location: [facility];  Service: Orthopedics;  Laterality: Left;  . I&d extremity Left 09/11/2013    Procedure: LEFT FOOT IRRIGATION AND DEBRIDEMENT;  Surgeon: Marianna Payment, MD;  Location: [facility];  Service: Orthopedics;  Laterality: Left;  . Amputation Left 09/11/2013    Procedure: AMPUTATION BELOW KNEE;  Surgeon: Marianna Payment, MD;  Location: [facility];  Service: Orthopedics;  Laterality: Left;   Family History  Problem Relation Age of Onset  . Diabetes type II Mother   . Dementia Mother   . Heart disease Father    History    Substance Use Topics  . Smoking status: Never Smoker   . Smokeless tobacco: Never Used  . Alcohol Use: No    Review of Systems  Constitutional: Negative for fever.  Cardiovascular: Positive for leg swelling.  Musculoskeletal: Positive for joint swelling.  Skin: Positive for color change and wound.  Neurological: Negative for weakness.  All other systems reviewed and are negative.     Allergies  Tramadol  Home Medications   Current Outpatient Rx  Name  Route  Sig  Dispense  Refill  . aspirin EC 325 MG EC tablet   Oral   Take 1 tablet (325 mg total) by mouth daily.   30 tablet   0   . insulin aspart protamine- aspart (NOVOLOG MIX 70/30) (70-30) 100 UNIT/ML injection   Subcutaneous   Inject 0.2 mLs (20 Units total) into the skin 2 (two) times daily with a meal.   10 mL   11   . Multiple Vitamin (MULTIVITAMIN WITH MINERALS) TABS tablet   Oral   Take 1 tablet by mouth daily.   30 tablet   1    BP 137/82  Pulse 76  Temp(Src) 98.8 F (37.1 C) (Oral)  Resp 20  Wt 145 lb (65.772 kg)  SpO2 100% Physical Exam  Nursing note and vitals reviewed. Constitutional: He is oriented to person, place, and time. He appears well-developed and well-nourished.  No distress.  HENT:  Head: Normocephalic and atraumatic.  Right Ear: External ear normal.  Left Ear: External ear normal.  Nose: Nose normal.  Eyes: Right eye exhibits no discharge. Left eye exhibits no discharge.  Neck: Neck supple.  Cardiovascular: Normal rate, regular rhythm, normal heart sounds and intact distal pulses.   Pulmonary/Chest: Effort normal.  Abdominal: Soft. There is no tenderness.  Musculoskeletal: He exhibits no edema.       Feet:  Left BKA.  Neurological: He is alert and oriented to person, place, and time.  Skin: Skin is warm and dry.    ED Course  Procedures (including critical care time) Labs Review Labs Reviewed  CBC WITH DIFFERENTIAL - Abnormal; Notable for the following:    RDW 15.9  (*)    All other components within normal limits  COMPREHENSIVE METABOLIC PANEL - Abnormal; Notable for the following:    Glucose, Bld 140 (*)    All other components within normal limits  CBG MONITORING, ED - Abnormal; Notable for the following:    Glucose-Capillary 139 (*)    All other components within normal limits  I-STAT CG4 LACTIC ACID, ED   Imaging Review Dg Foot Complete Right  11/11/2013   CLINICAL DATA:  Right foot swelling.  EXAM: RIGHT FOOT COMPLETE - 3+ VIEW  COMPARISON:  DG FOOT COMPLETE*R* dated 09/01/2013  FINDINGS: Soft tissue prominence without evidence of fracture, dislocation, bony lesion or bony destruction. Stable mild degenerative changes and mild hallux valgus.  IMPRESSION: No acute findings.   Electronically Signed   By: Aletta Edouard M.D.   On: 11/11/2013 18:42     EKG Interpretation None      MDM   Final diagnoses:  Cellulitis of right foot    Patient is well appearing. Had tachycardia in triage but without treatment he has had normal HR since. No fevers. No drainage. Xray shows mild swelling w/o other changes. His toes are unchanged. This appears c/w cellulitis. Does not cross any joints and no systemic symptoms. Normal WBC. Discussed with Dr. Rush Farmer of ortho, who recommends IV clinda now and d/c with doxycycline and follow up with his ortho doc, Dr. Erlinda Hong, in a few days. Patient OK with this plan as well and understands to return if cellulitis worsens or he develops systemic symptoms.    Ephraim Hamburger, MD 11/11/13 [phone]

## 2013-11-11 NOTE — ED Notes (Signed)
Gently cleaned foot with NS and placed fall sock on PT's foot

## 2013-11-29 ENCOUNTER — Inpatient Hospital Stay: Payer: Self-pay | Admitting: Physical Medicine & Rehabilitation

## 2013-11-29 ENCOUNTER — Encounter: Payer: MEDICAID | Attending: Physical Medicine & Rehabilitation

## 2013-12-13 ENCOUNTER — Other Ambulatory Visit (HOSPITAL_COMMUNITY): Payer: Self-pay | Admitting: Orthopaedic Surgery

## 2013-12-13 ENCOUNTER — Encounter (HOSPITAL_COMMUNITY): Payer: Self-pay | Admitting: Pharmacy Technician

## 2013-12-17 ENCOUNTER — Encounter (HOSPITAL_COMMUNITY): Payer: Self-pay | Admitting: *Deleted

## 2013-12-17 MED ORDER — CEFAZOLIN SODIUM-DEXTROSE 2-3 GM-% IV SOLR: 2.0000 g | INTRAVENOUS | Status: DC

## 2013-12-17 NOTE — Progress Notes (Signed)
Pt living at an assisted living facility, [facility] from [facility]. Spoke with Arville Go, supervisor in charge and she verified allergies, meds and medical/surgical hx. Pt was near her and I could hear her ask him questions. Gave her pre-op instructions and I heard her tell him the instructions as I was giving them to her. He states his sister, [name] will be here tomorrow with him. The facility will be arranging his transportation in the AM.

## 2013-12-18 ENCOUNTER — Encounter (HOSPITAL_COMMUNITY): Payer: Self-pay | Admitting: *Deleted

## 2013-12-18 ENCOUNTER — Encounter (HOSPITAL_COMMUNITY): Payer: Medicaid Other | Admitting: Anesthesiology

## 2013-12-18 ENCOUNTER — Encounter (HOSPITAL_COMMUNITY)
Admission: RE | Disposition: A | Discharge status: Skilled Nursing Facility | Payer: Self-pay | Source: Ambulatory Visit | Attending: Orthopaedic Surgery

## 2013-12-18 ENCOUNTER — Ambulatory Visit (HOSPITAL_COMMUNITY): Payer: Medicaid Other

## 2013-12-18 ENCOUNTER — Ambulatory Visit (HOSPITAL_COMMUNITY): Payer: Medicaid Other | Admitting: Anesthesiology

## 2013-12-18 ENCOUNTER — Observation Stay (HOSPITAL_COMMUNITY)
Admission: RE | Admit: 2013-12-18 | Discharge: 2013-12-22 | Disposition: A | Discharge status: Skilled Nursing Facility | Payer: Medicaid Other | Source: Ambulatory Visit | Attending: Orthopaedic Surgery | Admitting: Orthopaedic Surgery

## 2013-12-18 DIAGNOSIS — I10 Essential (primary) hypertension: Secondary | ICD-10-CM | POA: Insufficient documentation

## 2013-12-18 DIAGNOSIS — S88119A Complete traumatic amputation at level between knee and ankle, unspecified lower leg, initial encounter: Secondary | ICD-10-CM | POA: Insufficient documentation

## 2013-12-18 DIAGNOSIS — Z794 Long term (current) use of insulin: Secondary | ICD-10-CM | POA: Insufficient documentation

## 2013-12-18 DIAGNOSIS — Z7982 Long term (current) use of aspirin: Secondary | ICD-10-CM | POA: Insufficient documentation

## 2013-12-18 DIAGNOSIS — I96 Gangrene, not elsewhere classified: Secondary | ICD-10-CM | POA: Insufficient documentation

## 2013-12-18 DIAGNOSIS — K759 Inflammatory liver disease, unspecified: Secondary | ICD-10-CM | POA: Insufficient documentation

## 2013-12-18 DIAGNOSIS — E1159 Type 2 diabetes mellitus with other circulatory complications: Principal | ICD-10-CM | POA: Insufficient documentation

## 2013-12-18 DIAGNOSIS — M8708 Idiopathic aseptic necrosis of bone, other site: Secondary | ICD-10-CM | POA: Insufficient documentation

## 2013-12-18 DIAGNOSIS — E1149 Type 2 diabetes mellitus with other diabetic neurological complication: Secondary | ICD-10-CM

## 2013-12-18 HISTORY — PX: AMPUTATION: SHX166

## 2013-12-18 LAB — CBC
HCT: 35.5 % — ABNORMAL LOW (ref 39.0–52.0)
Hemoglobin: 12.5 g/dL — ABNORMAL LOW (ref 13.0–17.0)
MCH: 29.8 pg (ref 26.0–34.0)
MCHC: 35.2 g/dL (ref 30.0–36.0)
MCV: 84.7 fL (ref 78.0–100.0)
PLATELETS: 208 10*3/uL (ref 150–400)
RBC: 4.19 MIL/uL — ABNORMAL LOW (ref 4.22–5.81)
RDW: 15.3 % (ref 11.5–15.5)
WBC: 7.5 10*3/uL (ref 4.0–10.5)

## 2013-12-18 LAB — COMPREHENSIVE METABOLIC PANEL
ALK PHOS: 78 U/L (ref 39–117)
ALT: 13 U/L (ref 0–53)
AST: 17 U/L (ref 0–37)
Albumin: 3.3 g/dL — ABNORMAL LOW (ref 3.5–5.2)
BUN: 12 mg/dL (ref 6–23)
CHLORIDE: 106 meq/L (ref 96–112)
CO2: 23 mEq/L (ref 19–32)
CREATININE: 0.95 mg/dL (ref 0.50–1.35)
Calcium: 9.2 mg/dL (ref 8.4–10.5)
GFR calc Af Amer: >90 mL/min (ref >90)
Glucose, Bld: 160 mg/dL — ABNORMAL HIGH (ref 70–99)
Potassium: 4.6 mEq/L (ref 3.7–5.3)
SODIUM: 142 meq/L (ref 137–147)
Total Bilirubin: 0.3 mg/dL (ref 0.3–1.2)
Total Protein: 7 g/dL (ref 6.0–8.3)

## 2013-12-18 LAB — GLUCOSE, CAPILLARY
GLUCOSE-CAPILLARY: 164 mg/dL — AB (ref 70–99)
GLUCOSE-CAPILLARY: 122 mg/dL — AB (ref 70–99)
Glucose-Capillary: 147 mg/dL — ABNORMAL HIGH (ref 70–99)
Glucose-Capillary: 210 mg/dL — ABNORMAL HIGH (ref 70–99)
Glucose-Capillary: 256 mg/dL — ABNORMAL HIGH (ref 70–99)

## 2013-12-18 SURGERY — AMPUTATION, FOOT, RAY
Anesthesia: Regional | Laterality: Right

## 2013-12-18 MED ORDER — METHOCARBAMOL 500 MG PO TABS: 500.0000 mg | ORAL_TABLET | Freq: Four times a day (QID) | ORAL | Status: DC | PRN

## 2013-12-18 MED ORDER — INSULIN ASPART 100 UNIT/ML ~~LOC~~ SOLN
0.0000 [IU] | Freq: Every day | SUBCUTANEOUS | Status: DC
  Filled 2013-12-18 (×8): qty 0.05

## 2013-12-18 MED ORDER — INSULIN ASPART PROT & ASPART (70-30 MIX) 100 UNIT/ML ~~LOC~~ SUSP
5.0000 [IU] | SUBCUTANEOUS | Status: DC
  Administered 2013-12-18 – 2013-12-20 (×3): 5 [IU] via SUBCUTANEOUS

## 2013-12-18 MED ORDER — HYDROMORPHONE HCL PF 1 MG/ML IJ SOLN: 0.2500 mg | INTRAMUSCULAR | Status: DC | PRN

## 2013-12-18 MED ORDER — ASPIRIN EC 325 MG PO TBEC
325.0000 mg | DELAYED_RELEASE_TABLET | Freq: Two times a day (BID) | ORAL | Status: DC
  Administered 2013-12-18 – 2013-12-22 (×8): 325 mg via ORAL
  Filled 2013-12-18 (×10): qty 1

## 2013-12-18 MED ORDER — SODIUM CHLORIDE 0.9 % IV SOLN
INTRAVENOUS | Status: DC
  Administered 2013-12-18 – 2013-12-20 (×3): via INTRAVENOUS

## 2013-12-18 MED ORDER — FENTANYL CITRATE 0.05 MG/ML IJ SOLN
INTRAMUSCULAR | Status: DC | PRN
  Administered 2013-12-18: 50 ug via INTRAVENOUS
  Administered 2013-12-18: 100 ug via INTRAVENOUS
  Administered 2013-12-18 (×2): 50 ug via INTRAVENOUS

## 2013-12-18 MED ORDER — LIDOCAINE HCL (CARDIAC) 20 MG/ML IV SOLN
INTRAVENOUS | Status: DC | PRN
  Administered 2013-12-18: 100 mg via INTRAVENOUS

## 2013-12-18 MED ORDER — LIDOCAINE HCL (CARDIAC) 20 MG/ML IV SOLN
INTRAVENOUS | Status: AC
  Filled 2013-12-18: qty 5

## 2013-12-18 MED ORDER — LACTATED RINGERS IV SOLN
INTRAVENOUS | Status: DC | PRN
  Administered 2013-12-18: 11:00:00 via INTRAVENOUS

## 2013-12-18 MED ORDER — OXYCODONE HCL 5 MG PO TABS: 5.0000 mg | ORAL_TABLET | ORAL | Status: DC | PRN

## 2013-12-18 MED ORDER — MIDAZOLAM HCL 2 MG/2ML IJ SOLN
INTRAMUSCULAR | Status: AC
  Filled 2013-12-18: qty 2

## 2013-12-18 MED ORDER — ONDANSETRON HCL 4 MG/2ML IJ SOLN
4.0000 mg | Freq: Once | INTRAMUSCULAR | Status: AC | PRN
  Administered 2013-12-18: 4 mg via INTRAVENOUS

## 2013-12-18 MED ORDER — MAGNESIUM CITRATE PO SOLN: 1.0000 | Freq: Once | ORAL | Status: AC | PRN

## 2013-12-18 MED ORDER — PROPOFOL 10 MG/ML IV BOLUS
INTRAVENOUS | Status: DC | PRN
  Administered 2013-12-18: 20 mg via INTRAVENOUS
  Administered 2013-12-18: 30 mg via INTRAVENOUS
  Administered 2013-12-18: 10 mg via INTRAVENOUS
  Administered 2013-12-18 (×4): 20 mg via INTRAVENOUS

## 2013-12-18 MED ORDER — SORBITOL 70 % SOLN: 30.0000 mL | Freq: Every day | Status: DC | PRN

## 2013-12-18 MED ORDER — HYDROCODONE-ACETAMINOPHEN 5-325 MG PO TABS: 1.0000 | ORAL_TABLET | ORAL | Status: DC | PRN

## 2013-12-18 MED ORDER — METOCLOPRAMIDE HCL 10 MG PO TABS: 5.0000 mg | ORAL_TABLET | Freq: Three times a day (TID) | ORAL | Status: DC | PRN

## 2013-12-18 MED ORDER — LACTATED RINGERS IV SOLN
INTRAVENOUS | Status: DC
  Administered 2013-12-18: 10:00:00 via INTRAVENOUS

## 2013-12-18 MED ORDER — POLYETHYLENE GLYCOL 3350 17 G PO PACK
17.0000 g | PACK | Freq: Every day | ORAL | Status: DC | PRN
Start: 2013-12-18 — End: 2013-12-22

## 2013-12-18 MED ORDER — MIDAZOLAM HCL 5 MG/5ML IJ SOLN
INTRAMUSCULAR | Status: DC | PRN
  Administered 2013-12-18: 2 mg via INTRAVENOUS

## 2013-12-18 MED ORDER — PROPOFOL 10 MG/ML IV BOLUS
INTRAVENOUS | Status: AC
  Filled 2013-12-18: qty 20

## 2013-12-18 MED ORDER — MORPHINE SULFATE 2 MG/ML IJ SOLN
1.0000 mg | INTRAMUSCULAR | Status: DC | PRN
Start: 2013-12-18 — End: 2013-12-22

## 2013-12-18 MED ORDER — METOCLOPRAMIDE HCL 5 MG/ML IJ SOLN: 5.0000 mg | Freq: Three times a day (TID) | INTRAMUSCULAR | Status: DC | PRN

## 2013-12-18 MED ORDER — DIPHENHYDRAMINE HCL 12.5 MG/5ML PO ELIX: 25.0000 mg | ORAL_SOLUTION | ORAL | Status: DC | PRN

## 2013-12-18 MED ORDER — FENTANYL CITRATE 0.05 MG/ML IJ SOLN
INTRAMUSCULAR | Status: AC
  Filled 2013-12-18: qty 5

## 2013-12-18 MED ORDER — ADULT MULTIVITAMIN W/MINERALS CH
1.0000 | ORAL_TABLET | Freq: Every day | ORAL | Status: DC
  Administered 2013-12-19 – 2013-12-22 (×4): 1 via ORAL
  Filled 2013-12-18 (×5): qty 1

## 2013-12-18 MED ORDER — 0.9 % SODIUM CHLORIDE (POUR BTL) OPTIME
TOPICAL | Status: DC | PRN
  Administered 2013-12-18: 1000 mL

## 2013-12-18 MED ORDER — INSULIN ASPART 100 UNIT/ML ~~LOC~~ SOLN
0.0000 [IU] | Freq: Three times a day (TID) | SUBCUTANEOUS | Status: DC
  Administered 2013-12-18: 8 [IU] via SUBCUTANEOUS
  Administered 2013-12-19: 2 [IU] via SUBCUTANEOUS
  Administered 2013-12-19 – 2013-12-22 (×4): 3 [IU] via SUBCUTANEOUS
  Filled 2013-12-18 (×23): qty 0.15

## 2013-12-18 MED ORDER — ONDANSETRON HCL 4 MG/2ML IJ SOLN: 4.0000 mg | Freq: Four times a day (QID) | INTRAMUSCULAR | Status: DC | PRN

## 2013-12-18 MED ORDER — SENNA 8.6 MG PO TABS
1.0000 | ORAL_TABLET | Freq: Two times a day (BID) | ORAL | Status: DC
  Administered 2013-12-18 – 2013-12-22 (×8): 8.6 mg via ORAL
  Filled 2013-12-18 (×9): qty 1

## 2013-12-18 MED ORDER — ONDANSETRON HCL 4 MG PO TABS: 4.0000 mg | ORAL_TABLET | Freq: Four times a day (QID) | ORAL | Status: DC | PRN

## 2013-12-18 MED ORDER — INSULIN ASPART PROT & ASPART (70-30 MIX) 100 UNIT/ML ~~LOC~~ SUSP
20.0000 [IU] | SUBCUTANEOUS | Status: DC
  Administered 2013-12-18 – 2013-12-22 (×7): 20 [IU] via SUBCUTANEOUS
  Filled 2013-12-18 (×2): qty 10

## 2013-12-18 MED ORDER — ONDANSETRON HCL 4 MG/2ML IJ SOLN
INTRAMUSCULAR | Status: AC
  Filled 2013-12-18: qty 2

## 2013-12-18 MED ORDER — METHOCARBAMOL 1000 MG/10ML IJ SOLN: 500.0000 mg | Freq: Four times a day (QID) | INTRAVENOUS | Status: DC | PRN

## 2013-12-18 MED ORDER — CEFAZOLIN SODIUM-DEXTROSE 2-3 GM-% IV SOLR
INTRAVENOUS | Status: AC
  Administered 2013-12-18: 2 g via INTRAVENOUS
  Filled 2013-12-18: qty 50

## 2013-12-18 SURGICAL SUPPLY — 39 items
BANDAGE GAUZE 4  KLING STR (GAUZE/BANDAGES/DRESSINGS) ×2 IMPLANT
BLADE AVERAGE 25X9 (BLADE) ×2 IMPLANT
BNDG COHESIVE 4X5 TAN STRL (GAUZE/BANDAGES/DRESSINGS) ×2 IMPLANT
CANISTER SUCTION WELLS/JOHNSON (MISCELLANEOUS) ×2 IMPLANT
CANISTER WOUND CARE 500ML ATS (WOUND CARE) ×2 IMPLANT
COVER LIGHT HANDLE  DEROYL (MISCELLANEOUS) ×2 IMPLANT
CUFF TOURNIQUET SINGLE 24IN (TOURNIQUET CUFF) ×2 IMPLANT
DRSG EMULSION OIL 3X3 NADH (GAUZE/BANDAGES/DRESSINGS) ×2 IMPLANT
DRSG VAC ATS SM SENSATRAC (GAUZE/BANDAGES/DRESSINGS) ×2 IMPLANT
DURAPREP 26ML APPLICATOR (WOUND CARE) ×2 IMPLANT
ELECT CAUTERY BLADE 6.4 (BLADE) ×2 IMPLANT
ELECT REM PT RETURN 9FT ADLT (ELECTROSURGICAL) ×2
ELECTRODE REM PT RTRN 9FT ADLT (ELECTROSURGICAL) ×1 IMPLANT
FACESHIELD WRAPAROUND (MASK) ×2 IMPLANT
GAUZE XEROFORM 1X8 LF (GAUZE/BANDAGES/DRESSINGS) ×2 IMPLANT
GLOVE BIOGEL PI IND STRL 7.5 (GLOVE) ×1 IMPLANT
GLOVE BIOGEL PI INDICATOR 7.5 (GLOVE) ×1
GLOVE SURG SS PI 6.5 STRL IVOR (GLOVE) ×2 IMPLANT
GLOVE SURG SS PI 7.5 STRL IVOR (GLOVE) ×4 IMPLANT
GOWN STRL REUS W/ TWL XL LVL3 (GOWN DISPOSABLE) ×2 IMPLANT
GOWN STRL REUS W/TWL XL LVL3 (GOWN DISPOSABLE) ×2
KIT BASIN OR (CUSTOM PROCEDURE TRAY) ×2 IMPLANT
KIT ROOM TURNOVER OR (KITS) ×2 IMPLANT
MANIFOLD NEPTUNE II (INSTRUMENTS) IMPLANT
NS IRRIG 1000ML POUR BTL (IV SOLUTION) ×2 IMPLANT
PACK ORTHO EXTREMITY (CUSTOM PROCEDURE TRAY) ×2 IMPLANT
PAD ARMBOARD 7.5X6 YLW CONV (MISCELLANEOUS) ×2 IMPLANT
SPONGE GAUZE 4X4 12PLY (GAUZE/BANDAGES/DRESSINGS) ×2 IMPLANT
SPONGE LAP 18X18 X RAY DECT (DISPOSABLE) ×4 IMPLANT
SUCTION FRAZIER TIP 10 FR DISP (SUCTIONS) ×2 IMPLANT
SUT ETHILON 2 0 PSLX (SUTURE) ×4 IMPLANT
SUT MON AB 2-0 CT1 36 (SUTURE) ×2 IMPLANT
SUT PDS AB 0 CT 36 (SUTURE) IMPLANT
TOWEL OR 17X24 6PK STRL BLUE (TOWEL DISPOSABLE) ×2 IMPLANT
TOWEL OR 17X26 10 PK STRL BLUE (TOWEL DISPOSABLE) ×2 IMPLANT
TUBE CONNECTING 12X1/4 (SUCTIONS) ×2 IMPLANT
TUBING CYSTO DISP (UROLOGICAL SUPPLIES) ×2 IMPLANT
UNDERPAD 30X30 INCONTINENT (UNDERPADS AND DIAPERS) ×2 IMPLANT
WATER STERILE IRR 1000ML POUR (IV SOLUTION) IMPLANT

## 2013-12-18 NOTE — Anesthesia Procedure Notes (Signed)
Anesthesia Regional Block:  Ankle block  Pre-Anesthetic Checklist: ,, timeout performed, Correct Patient, Correct Site, Correct Laterality, Correct Procedure, Correct Position, site marked, Risks and benefits discussed,  Surgical consent,  Pre-op evaluation,  At surgeon's request and post-op pain management  Laterality: Left  Prep: chloraprep and alcohol swabs       Needles:  Injection technique: Single-shot      Needle Gauge: 25 and 25 G  Needle insertion depth: 2 cm   Additional Needles: Ankle block Narrative:  Start time: 12/18/2013 10:55 AM End time: 12/18/2013 11:04 AM Injection made incrementally with aspirations every 5 mL.  Performed by: Personally  Anesthesiologist: Sharolyn Douglas MD  Additional Notes: Pt accepts procedure w/ risks. Lidocaine 2% 20 cc and Marcaine 0.5% 20cc Ant / Post Tibial and Sural N. GES

## 2013-12-18 NOTE — H&P (Signed)
ORTHOPAEDIC HISTORY AND PHYSICAL   Chief Complaint: right gangrenous toes  HPI: [name] is a 58 y.o. male who complains of right gangrenous toes.  He presents today for surgical amputation of those toes.  Denies any changes in medical history.    Past Medical History  Diagnosis Date  . Hepatitis   . Hypertension   . Pneumonia     HX OF PNA  . Diabetes mellitus without complication     PATIENT JUST LEARNED HE WAS DIABETIC  . Necrotizing fasciitis   . Hyponatremia   . Hypokalemia    Past Surgical History  Procedure Laterality Date  . Cholecystectomy    . I&d extremity Left 09/03/2013    Procedure: IRRIGATION AND DEBRIDEMENT EXTREMITY;  Surgeon: Marianna Payment, MD;  Location: [facility];  Service: Orthopedics;  Laterality: Left;  . I&d extremity Left 09/11/2013    Procedure: LEFT FOOT IRRIGATION AND DEBRIDEMENT;  Surgeon: Marianna Payment, MD;  Location: [facility];  Service: Orthopedics;  Laterality: Left;  . Amputation Left 09/11/2013    Procedure: AMPUTATION BELOW KNEE;  Surgeon: Marianna Payment, MD;  Location: [facility];  Service: Orthopedics;  Laterality: Left;   History   Social History  . Marital Status: Single    Spouse Name: N/A    Number of Children: N/A  . Years of Education: N/A   Social History Main Topics  . Smoking status: Never Smoker   . Smokeless tobacco: Never Used  . Alcohol Use: No  . Drug Use: No  . Sexual Activity: None   Other Topics Concern  . None   Social History Narrative  . None   Family History  Problem Relation Age of Onset  . Diabetes type II Mother   . Dementia Mother   . Heart disease Father    Allergies  Allergen Reactions  . Tramadol Nausea And Vomiting    Pt states he took this on an empty stomach one time and it came right back up. He's not had it since.    Prior to Admission medications   Medication Sig Start Date End Date Taking? Authorizing Provider  aspirin EC 81 MG tablet Take 81 mg by mouth daily.   Yes  Historical Provider, MD  insulin aspart protamine- aspart (NOVOLOG MIX 70/30) (70-30) 100 UNIT/ML injection Inject 5-20 Units into the skin 3 (three) times daily. 20 units in am , 20 units at 5 pm and 5 units at 8 pm .   Yes Historical Provider, MD  Multiple Vitamin (MULTIVITAMIN WITH MINERALS) TABS tablet Take 1 tablet by mouth daily. 10/22/13  Yes Ivan Anchors Love, PA-C   No results found.  Positive ROS: All other systems have been reviewed and were otherwise negative with the exception of those mentioned in the HPI and as above.  Physical Exam: General: Alert, no acute distress Cardiovascular: No pedal edema Respiratory: No cyanosis, no use of accessory musculature GI: No organomegaly, abdomen is soft and non-tender Skin: No lesions in the area of chief complaint Neurologic: Sensation intact distally Psychiatric: Patient is competent for consent with normal mood and affect Lymphatic: No axillary or cervical lymphadenopathy  MUSCULOSKELETAL:  - dry gangrene of GT, 2nd, 3rd toes - scant drainage - malodorous discharge  Assessment: Right toe gangrene  Plan: - plan for toe vs ray amputation of toes - r/b/a again reviewed with patient and he elects to proceed - all questions answered to patient's satisfaction   N. Eduard Roux, MD [place]  7:21 AM

## 2013-12-18 NOTE — Anesthesia Preprocedure Evaluation (Signed)
Anesthesia Evaluation  Patient identified by MRN, date of birth, ID band Patient awake    Reviewed: Allergy & Precautions, H&P , NPO status , Patient's Chart, lab work & pertinent test results  Airway       Dental   Pulmonary          Cardiovascular hypertension,     Neuro/Psych    GI/Hepatic (+) Hepatitis -  Endo/Other  diabetes, Type 1, Insulin Dependent  Renal/GU      Musculoskeletal   Abdominal   Peds  Hematology   Anesthesia Other Findings   Reproductive/Obstetrics                           Anesthesia Physical Anesthesia Plan  ASA: III  Anesthesia Plan: Regional   Post-op Pain Management:    Induction: Intravenous  Airway Management Planned: Simple Face Mask  Additional Equipment:   Intra-op Plan:   Post-operative Plan:   Informed Consent: I have reviewed the patients History and Physical, chart, labs and discussed the procedure including the risks, benefits and alternatives for the proposed anesthesia with the patient or authorized representative who has indicated his/her understanding and acceptance.     Plan Discussed with:   Anesthesia Plan Comments:         Anesthesia Quick Evaluation

## 2013-12-18 NOTE — Transfer of Care (Signed)
Immediate Anesthesia Transfer of Care Note  Patient: [name]  Procedure(s) Performed: Procedure(s): RIGHT FOOT 1,2, TOE AMPUTATION  5th toe RAY AMPUTATION (Right)  Patient Location: PACU  Anesthesia Type: MAC  Level of Consciousness: awake and alert   Airway & Oxygen Therapy: Patient Spontanous Breathing  Post-op Assessment: Report given to PACU RN and Post -op Vital signs reviewed and stable  Post vital signs: Reviewed and stable  Complications: No apparent anesthesia complications

## 2013-12-18 NOTE — Anesthesia Postprocedure Evaluation (Signed)
  Anesthesia Post-op Note  Patient: [name]  Procedure(s) Performed: Procedure(s): RIGHT FOOT 1,2, TOE AMPUTATION  5th toe RAY AMPUTATION (Right)  Patient Location: PACU  Anesthesia Type:Regional  Level of Consciousness: awake, alert , oriented and patient cooperative  Airway and Oxygen Therapy: Patient Spontanous Breathing  Post-op Pain: none  Post-op Assessment: Post-op Vital signs reviewed, Patient's Cardiovascular Status Stable, Respiratory Function Stable, Patent Airway, No signs of Nausea or vomiting and Pain level controlled  Post-op Vital Signs: stable  Last Vitals:  Filed Vitals:   12/18/13 1302  BP:   Pulse:   Temp: 36.8 C  Resp:     Complications: No apparent anesthesia complications

## 2013-12-18 NOTE — Progress Notes (Signed)
Orthopedic Tech Progress Note Patient Details:  [name] 01/26/1956 [id number]  Ortho Devices Ortho Device/Splint Location: applied ohf on bed Ortho Device/Splint Interventions: Ordered;Application   [name] 12/18/2013, 8:15 PM

## 2013-12-18 NOTE — Op Note (Signed)
   Date of Surgery: 12/18/2013  INDICATIONS: [name] is a 58 y.o.-year-old male who presents for surgical treatment of right toe gangrene ;  The patient did consent to the procedure after discussion of the risks and benefits.  PREOPERATIVE DIAGNOSIS: right great, second, and fifth toe gangrene  POSTOPERATIVE DIAGNOSIS: Same.  PROCEDURE:  1. Right great toe amputation at metatarsophalangeal joint 2. Right second toe amputation at metarasophalangeal joint 3. Right 5th ray amputation  SURGEON: N. Eduard Roux, M.D.  ASSIST: none.  ANESTHESIA:  Ankle block and conscious sedation  IV FLUIDS AND URINE: See anesthesia.  ESTIMATED BLOOD LOSS: 150 mL.  IMPLANTS: none  DRAINS: wound vac  COMPLICATIONS: None.  DESCRIPTION OF PROCEDURE: The patient was brought to the operating room and placed supine on the operating table.  The patient had been signed prior to the procedure and this was documented. The patient had the anesthesia placed by the anesthesiologist.  A time-out was performed to confirm that this was the correct patient, site, side and location. The patient had an SCD on the opposite lower extremity. The patient did receive antibiotics prior to the incision and was re-dosed during the procedure as needed at indicated intervals.  A nonsterile tourniquet was placed on the upper thigh.  The patient had the operative extremity prepped and draped in the standard surgical fashion.    The extremity was elevated for exsanguination and the tourniquet was inflated to 350 mm mercury. For the great and second toe amputations a fishmouth incision was used for each toe. Full-thickness flaps were created. The toes were amputated at the metatarsal phalangeal joint. Neurovascular bundles were identified and cauterized and incised sharply to allow for retraction. For the fifth toe given the large amount of gangrene, a ray amputation had to be performed. A racquet type incision based over the lateral aspect  of the foot was used. Full-thickness flaps were created. A partial fifth ray amputation was performed. The base of the fifth metatarsal was left intact to allow for the insertion of the peroneus brevis. Given the large wound, I was not able to close all that primarily. The wound measured approximately 5 cm x 8 cm.  The wounds were thoroughly irrigated using 3 L of normal saline. The great and second toe indications were closed primarily in a layered fashion using 2-0 Monocryl and 2-0 nylon for the skin. For the fifth ray amputation the proximal one third of the was closed using 2-0 nylon. The rest of the wound was left open and dressed with a wound VAC.  The tourniquet was deflated. Hemostasis was obtained. There was no signs of residual infection. The patient tolerated the procedure well and was extubated. He was transferred to the PACU in stable condition.    POSTOPERATIVE PLAN: The patient will be nonweightbearing to the right lower extremity. The plan is to set him up with home health nursing and allowed the open wound to heal by secondary intention. He will require serial wound VAC changes every Monday Wednesday and Friday.  Azucena Cecil, MD [place] 12:56 PM

## 2013-12-19 LAB — GLUCOSE, CAPILLARY
GLUCOSE-CAPILLARY: 131 mg/dL — AB (ref 70–99)
GLUCOSE-CAPILLARY: 66 mg/dL — AB (ref 70–99)
GLUCOSE-CAPILLARY: 106 mg/dL — AB (ref 70–99)
Glucose-Capillary: 141 mg/dL — ABNORMAL HIGH (ref 70–99)
Glucose-Capillary: 154 mg/dL — ABNORMAL HIGH (ref 70–99)
Glucose-Capillary: 73 mg/dL (ref 70–99)
Glucose-Capillary: 94 mg/dL (ref 70–99)

## 2013-12-19 LAB — BASIC METABOLIC PANEL
BUN: 15 mg/dL (ref 6–23)
CO2: 24 meq/L (ref 19–32)
CREATININE: 0.9 mg/dL (ref 0.50–1.35)
Calcium: 8.9 mg/dL (ref 8.4–10.5)
Chloride: 106 mEq/L (ref 96–112)
GFR calc Af Amer: >90 mL/min (ref >90)
GFR calc non Af Amer: >90 mL/min (ref >90)
Glucose, Bld: 93 mg/dL (ref 70–99)
Potassium: 3.7 mEq/L (ref 3.7–5.3)
Sodium: 143 mEq/L (ref 137–147)

## 2013-12-19 NOTE — Progress Notes (Signed)
UR completed 

## 2013-12-19 NOTE — Care Management Note (Signed)
CARE MANAGEMENT NOTE 12/19/2013  Patient:  YOUNESS, LAWES   Account Number:  [id number]  Date Initiated:  12/19/2013  Documentation initiated by:  Ricki Miller  Subjective/Objective Assessment:   58 yr old male admitted with right forefoot gangrene, s/p amputation of toes right foot.     Action/Plan:   [facility] is from [facility] group [facility], will require shortter. He is non-weightbearing right foot SNF.   Anticipated DC Date:  12/20/2013   Anticipated DC Plan:  SKILLED NURSING FACILITY  In-house referral  Clinical Social Worker      DC Planning Services  CM consult      [facility] Choice  NA   Choice offered to / List presented to:          [facility] arranged  NA      Status of service:  Completed, signed off Medicare Important Message given?   (If response is "NO", the following Medicare IM given date fields will be blank) Date Medicare IM given:   Date Additional Medicare IM given:    Discharge Disposition:  SKILLED NURSING FACILITY  Per UR Regulation:    If discussed at Long Length of Stay Meetings, dates discussed:    Comments:

## 2013-12-19 NOTE — Progress Notes (Signed)
   Subjective:  Patient reports pain as mild.    Objective:   VITALS:   Filed Vitals:   12/18/13 1532 12/18/13 1541 12/18/13 2125 12/19/13 0647  BP: 125/68  107/62 105/63  Pulse: 58  89 87  Temp:  97.3 F (36.3 C) 98.3 F (36.8 C) 99.2 F (37.3 C)  TempSrc:   Oral Oral  Resp: 13  16 12   Height:      Weight:      SpO2: 100%  100% 99%    Neurologically intact Neurovascular intact Intact pulses distally Dorsiflexion/Plantar flexion intact Incision: dressing C/D/I and no drainage No cellulitis present Compartment soft VAC in place   Lab Results  Component Value Date   WBC 7.5 12/18/2013   HGB 12.5* 12/18/2013   HCT 35.5* 12/18/2013   MCV 84.7 12/18/2013   PLT 208 12/18/2013     Assessment/Plan:  1 Day Post-Op   - Expected postop acute blood loss anemia - will monitor for symptoms - Up with PT/OT - DVT ppx - SCDs, ambulation, asa - NWB right lower extremity - Pain control - will plan to keep VAC and allow wound to heal by secondary intention - VAC changes MWF by RN - will need home VAC and home health RN for the vac changes  Problem List Items Addressed This Visit   None       Sheilia Reznick Eduard Roux 12/19/2013, 7:51 AM [phone]

## 2013-12-19 NOTE — Evaluation (Signed)
Physical Therapy Evaluation Patient Details Name: [name] MRN: [id number] DOB: Jan 12, 1956 Today's Date: 12/19/2013   History of Present Illness  S?   R great toe, 2nd, and 5th ray amputation , application of VAC due to gangreen on 12/18/13. Pt is S/P L BKA  in 2/15. was on CIR for several weeks.  Clinical Impression  Pt mobilized to [facility] by sliding board. Pt will benefit fromPT WHILE IN ACUTE CARE TO ADDRESS PROBLEMS LISTED. Pt describes Home in which he moved into after CIR is not WC accessible. Pt reports that L residual limb is ready for prosthetic fitting but he is not ready. Prosthetist came in during session and acknowledged.     Follow Up Recommendations SNF;Supervision/Assistance - 24 hour    Equipment Recommendations   (sliding board.)    Recommendations for Other Services       Precautions / Restrictions Precautions Precautions: Fall Restrictions Weight Bearing Restrictions: Yes RLE Weight Bearing: Non weight bearing      Mobility  Bed Mobility Overal bed mobility: Modified Independent                Transfers Overall transfer level: Needs assistance   Transfers: Lateral/Scoot Transfers          Lateral/Scoot Transfers: With slide board General transfer comment: VC and instruction on placement of sliding board, safe use of board. Cues for sliding across board into [facility] with min assist, sitting on pad to slide as pt has on no opants.  Ambulation/Gait                Hotel manager mobility: Yes Wheelchair propulsion: Both upper extremities Wheelchair parts: Supervision/cueing Distance: 100' Wheelchair Assistance Details (indicate cue type and reason): cues for set up, locking breaks.  Modified Rankin (Stroke Patients Only)       Balance Overall balance assessment: Needs assistance Sitting-balance support: No upper extremity supported Sitting balance-Leahy Scale: Fair Sitting  balance - Comments: while sitting in WC, pt is able to reach L residual limb and apply shrinker independently and maintain balance.                                     Pertinent Vitals/Pain R foot when accidentally bumped. Declined Pain meds.    Home Living Family/patient expects to be discharged to:: Group home (for disabled) Living Arrangements: Other (Comment) Available Help at Discharge: Other (Comment) (family care home- pt reports he has no assistance, minimals support) Type of Home: House Home Access: Ramped entrance     Home Layout: One level Home Equipment: [place] - 2 wheels;Shower seat;Wheelchair - manual Additional Comments: pt reports WC is not accessible at the HOME where he stays.    Prior Function Level of Independence: Independent with assistive device(s)               Hand Dominance        Extremity/Trunk Assessment   Upper Extremity Assessment: Defer to OT evaluation           Lower Extremity Assessment: RLE deficits/detail;LLE deficits/detail RLE Deficits / Details: noted  dressing with VAC in place, grossly [facility] strength for bed mobility, Pt. is NWB. LLE Deficits / Details: residual limb ahs ROM WFL     Communication   Communication: No difficulties  Cognition Arousal/Alertness: Awake/alert Behavior During Therapy: WFL for tasks  assessed/performed Overall Cognitive Status: Within Functional Limits for tasks assessed                      General Comments      Exercises        Assessment/Plan    PT Assessment Patient needs continued PT services  PT Diagnosis Acute pain (NWB on R  LE)   PT Problem List Decreased activity tolerance;Decreased mobility;Decreased knowledge of precautions;Decreased safety awareness;Decreased knowledge of use of DME;Pain  PT Treatment Interventions DME instruction;Functional mobility training;Therapeutic activities;Therapeutic exercise;Patient/family education   PT Goals (Current  goals can be found in the Care Plan section) Acute Rehab PT Goals Patient Stated Goal: to go somewhere that I can use my WC. PT Goal Formulation: With patient Time For Goal Achievement: 01/02/14 Potential to Achieve Goals: Good    Frequency Min 4X/week   Barriers to discharge Inaccessible home environment;Decreased caregiver support      Co-evaluation PT/OT/SLP Co-Evaluation/Treatment: Yes Reason for Co-Treatment: For patient/therapist safety PT goals addressed during session: Mobility/safety with mobility         End of Session   Activity Tolerance: Patient tolerated treatment well Patient left: in chair;with call bell/phone within reach Nurse Communication: Mobility status;Other (comment) (need for sliding board.)         Time: [id number] PT Time Calculation (min): 45 min   Charges:     PT Treatments $Wheel Chair Management: 8-22 mins   PT G Codes:          Claretha Cooper 12/19/2013, 11:20 AM Tresa Endo PT [phone]

## 2013-12-19 NOTE — Evaluation (Signed)
I have read and agree with what is written. [place], [place] [phone] 12/19/2013

## 2013-12-19 NOTE — Progress Notes (Addendum)
12/19/13 1243  PT G-Codes **NOT FOR INPATIENT CLASS**  Functional Assessment Tool Used clincal judgement  Functional Limitation Mobility: Walking and moving around  Mobility: Walking and Moving Around Current Status [phone]) CK  Mobility: Walking and Moving Around Goal Status [phone]) CI  Tresa Endo PT [phone]

## 2013-12-19 NOTE — Progress Notes (Signed)
Physical Therapy Treatment Patient Details Name: [name] MRN: [id number] DOB: October 03, 1955 Today's Date: 12/19/2013    History of Present Illness S/P  R great toe, 2nd, and 5th ray amputation , application of VAC due to gangreen on 12/18/13. Pt is S/P L BKA  in 2/15. was on CIR for several weeks.    PT Comments    Pt has good upper body strength for transfers with sliding board.  Follow Up Recommendations  SNF;Supervision/Assistance - 24 hour     Equipment Recommendations   (sliding board.)    Recommendations for Other Services       Precautions / Restrictions Precautions Precautions: Fall Restrictions Weight Bearing Restrictions: Yes RLE Weight Bearing: Non weight bearing LLE Weight Bearing: Non weight bearing Other Position/Activity Restrictions: L BKA, no prosthesis    Mobility  Bed Mobility Overal bed mobility: Modified Independent                Transfers Overall transfer level: Needs assistance   Transfers: Lateral/Scoot Transfers          Lateral/Scoot Transfers: Min assist;With slide board General transfer comment: VC and instruction on placement of sliding board, safe use of board. Cues for sliding across board back onto bed with min assist, sitting on pad to slide as pt has on no pants.  Ambulation/Gait                 Hotel manager mobility: Yes Wheelchair propulsion: Both upper extremities Wheelchair parts: Supervision/cueing Distance: 100' Wheelchair Assistance Details (indicate cue type and reason): cues for set up, locking breaks.  Modified Rankin (Stroke Patients Only)       Balance Overall balance assessment: Needs assistance Sitting-balance support: No upper extremity supported Sitting balance-Leahy Scale: Fair Sitting balance - Comments: while sitting in WC, pt is able to reach L residual limb and apply shrinker independently and maintain balance.                             Cognition Arousal/Alertness: Awake/alert Behavior During Therapy: WFL for tasks assessed/performed Overall Cognitive Status: Within Functional Limits for tasks assessed                      Exercises      General Comments        Pertinent Vitals/Pain R foot when moved, no pain med requested    Home Living Family/patient expects to be discharged to:: Skilled nursing facility Living Arrangements: Other (Comment) Available Help at Discharge: Other (Comment) Type of Home: House Home Access: Ramped entrance   Home Layout: One level Home Equipment: [place] - 2 wheels;Shower seat;Wheelchair - manual Additional Comments: Pt previously staying in assisted living home with aide. Pt states that the facility is not handicap accessible and does not want to return there. Unsure as of now where pt will be d/c to, pending Medicare.    Prior Function Level of Independence: Needs assistance  Gait / Transfers Assistance Needed: Pt. used walker and w/c for mobility ADL's / Homemaking Assistance Needed: Pt had help with ADLs at facility     PT Goals (current goals can now be found in the care plan section) Acute Rehab PT Goals Patient Stated Goal: to go somewhere that I can use my WC. PT Goal Formulation: With patient Time For Goal Achievement: 01/02/14 Potential to Achieve Goals: Good  Additional Goals Additional Goal #1: set up and propel WC modified independently x 300' Progress towards PT goals: Progressing toward goals    Frequency  Min 3X/week    PT Plan Current plan remains appropriate    Co-evaluation PT/OT/SLP Co-Evaluation/Treatment: Yes Reason for Co-Treatment: Complexity of the patient's impairments (multi-system involvement) PT goals addressed during session: Mobility/safety with mobility OT goals addressed during session: ADL's and self-care     End of Session   Activity Tolerance: Patient tolerated treatment well Patient left:  in bed;with call bell/phone within reach     Time: 1140-1152 PT Time Calculation (min): 12 min  Charges:  $Therapeutic Activity: 8-22 mins $Wheel Chair Management: 8-22 mins                    G Codes:      Claretha Cooper 12/19/2013, 12:41 PM

## 2013-12-19 NOTE — Evaluation (Signed)
Occupational Therapy Evaluation Patient Details Name: [name] MRN: [id number] DOB: May 05, 1956 Today's Date: 12/19/2013    History of Present Illness S/P  R great toe, 2nd, and 5th ray amputation , application of VAC due to gangrene on 12/18/13. Pt is S/P L BKA  in 2/15. was on CIR for several weeks.   Clinical Impression   Pt is a 58 yo male admitted for gangrene of toes on R foot. Pt had 1,2 and 5th toe amputated, after previously having a L BKA and has a wound vac on his RLE. Pt has good UE strength but is NWB in both LEs. Pt doesn't want to return to the Assisted living facililty he was previously at because it was not handicap accessible but is unsure of where to go next, pending Medicaid, he wants to go to SNF. Case workers notified of situation.    Follow Up Recommendations  SNF    Equipment Recommendations  3 in 1 bedside comode with drop arm      Precautions / Restrictions Precautions Precautions: Fall Restrictions Weight Bearing Restrictions: Yes RLE Weight Bearing: Non weight bearing LLE Weight Bearing: Non weight bearing Other Position/Activity Restrictions: L BKA, no prosthesis      Mobility Bed Mobility Overal bed mobility: Modified Independent                Transfers Overall transfer level: Needs assistance Equipment used:  (sliding board) Transfers: Lateral/Scoot Transfers (using transfer board)          Lateral/Scoot Transfers: Min guard General transfer comment: min guard due for safety and stabilize w/c    Balance Overall balance assessment: Needs assistance Sitting-balance support: No upper extremity supported Sitting balance-Leahy Scale: Fair Sitting balance - Comments: while sitting in WC, pt is able to reach R LE to apply sock independently and maintain balance.                                    ADL Overall ADL's : Needs assistance/impaired Eating/Feeding: Independent;Sitting Eating/Feeding Details (indicate cue  type and reason): pt is able to self-feed with set up of food Grooming: Sitting;Set up Grooming Details (indicate cue type and reason): pt able to do grooming ADLs with set up assist Upper Body Bathing: Set up;Sitting   Lower Body Bathing: Min guard;Sitting/lateral leans Lower Body Bathing Details (indicate cue type and reason): Min guard for safety Upper Body Dressing : Set up;Sitting Upper Body Dressing Details (indicate cue type and reason): needs assistance getting the clothes Lower Body Dressing: Min guard;Sitting/lateral leans Lower Body Dressing Details (indicate cue type and reason): Min guard for safety Toilet Transfer: Minimal assistance;Transfer board;Requires drop arm Toilet Transfer Details (indicate cue type and reason): Able to use transfer board from bed to w/c Toileting- Clothing Manipulation and Hygiene: Set up;Sitting/lateral lean     Tub/Shower Transfer Details (indicate cue type and reason): Pt. unable to shower right now due to wound, will do sponge bath. Functional mobility during ADLs: Minimal assistance;Wheelchair;Modified independent General ADL Comments: Pt. is using a w/c and walker for functional mobility and needs AE to help with LB ADLs. Pt needs set up assist to enable UB ADLs.               Pertinent Vitals/Pain No c/o of pain        Extremity/Trunk Assessment Upper Extremity Assessment Upper Extremity Assessment: Overall WFL for tasks assessed   Lower Extremity Assessment  Lower Extremity Assessment: Defer to PT evaluation RLE Deficits / Details: Unable to assess due to WB precautions RLE: Unable to fully assess due to immobilization LLE Deficits / Details: Unable to assess due to WB precautions LLE: Unable to fully assess due to immobilization       Communication Communication Communication: No difficulties   Cognition Arousal/Alertness: Awake/alert Behavior During Therapy: WFL for tasks assessed/performed Overall Cognitive Status:  Within Functional Limits for tasks assessed                                Home Living Family/patient expects to be discharged to:: Skilled nursing facility Living Arrangements: Other (Comment) Available Help at Discharge: Other (Comment) Type of Home: House Home Access: Ramped entrance     Home Layout: One level               Home Equipment: [place] - 2 wheels;Wheelchair - manual   Additional Comments: Pt previously staying in assisted living home with aide. Pt states that the facility is not handicap accessible and does not want to return there. Unsure as of now where pt will be d/c to, pending Medicaid.      Prior Functioning/Environment Level of Independence: Independent with assistive device(s)  Gait / Transfers Assistance Needed: Pt. used walker for mobility ADL's / Homemaking Assistance Needed: Pt had help with ADLs at facility        OT Diagnosis: Generalized weakness   -OT Problem List: Decreased knowledge of use of DME or AE   OT Treatment/Interventions: Self-care/ADL training;DME and/or AE instruction    OT Goals(Current goals can be found in the care plan section) Acute Rehab OT Goals Patient Stated Goal: to go somewhere with 24 hour care OT Goal Formulation: With patient Potential to Achieve Goals: Good  OT Frequency: Min 2X/week   Barriers to D/C: Inaccessible home environment  Pt was previously living in an assisted living facility that he cannot return to due to it not being handicap accessible. Pt is unsure where he is going at discharge due to pending Medicaid, but wanting to go to SNF.       Co-evaluation PT/OT/SLP Co-Evaluation/Treatment: Yes Reason for Co-Treatment: For patient/therapist safety   OT goals addressed during session: ADL's and self-care      End of Session    Activity Tolerance: Patient tolerated treatment well Patient left: in chair;with call bell/phone within reach   Time: 1015-1043 OT Time Calculation  (min): 28 min Charges:  OT General Charges $OT Visit: 1 Procedure OT Evaluation $Initial OT Evaluation Tier I: 1 Procedure OT Treatments $Self Care/Home Management : 23-37 mins G-Codes: OT G-codes **NOT FOR INPATIENT CLASS** Functional Assessment Tool Used: clinical observation Functional Limitation: Self care Self Care Current Status [id number]): At least 20 percent but less than 40 percent impaired, limited or restricted Self Care Goal Status [id number]): At least 1 percent but less than 20 percent impaired, limited or restricted  Lyda Perone [id number] 12/19/2013, 4:16 PM

## 2013-12-20 ENCOUNTER — Encounter (HOSPITAL_COMMUNITY): Payer: Self-pay | Admitting: Orthopaedic Surgery

## 2013-12-20 LAB — BASIC METABOLIC PANEL
BUN: 15 mg/dL (ref 6–23)
CALCIUM: 9 mg/dL (ref 8.4–10.5)
CHLORIDE: 108 meq/L (ref 96–112)
CO2: 24 meq/L (ref 19–32)
Creatinine, Ser: 0.89 mg/dL (ref 0.50–1.35)
GFR calc Af Amer: >90 mL/min (ref >90)
GFR calc non Af Amer: >90 mL/min (ref >90)
Glucose, Bld: 170 mg/dL — ABNORMAL HIGH (ref 70–99)
Potassium: 3.7 mEq/L (ref 3.7–5.3)
Sodium: 144 mEq/L (ref 137–147)

## 2013-12-20 LAB — GLUCOSE, CAPILLARY
GLUCOSE-CAPILLARY: 172 mg/dL — AB (ref 70–99)
Glucose-Capillary: 82 mg/dL (ref 70–99)
Glucose-Capillary: 121 mg/dL — ABNORMAL HIGH (ref 70–99)
Glucose-Capillary: 174 mg/dL — ABNORMAL HIGH (ref 70–99)
Glucose-Capillary: 123 mg/dL — ABNORMAL HIGH (ref 70–99)

## 2013-12-20 NOTE — Progress Notes (Signed)
   Subjective:  Patient reports pain as mild.  Very happy.  Objective:   VITALS:   Filed Vitals:   12/19/13 [id number] 12/19/13 1424 12/19/13 2031 12/20/13 0529  BP: 105/63 117/65 120/64 128/62  Pulse: 87 93 90 83  Temp: 99.2 F (37.3 C) 98.4 F (36.9 C) 98.9 F (37.2 C) 98.5 F (36.9 C)  TempSrc: Oral Oral    Resp: 12 14 16 16   Height:      Weight:      SpO2: 99% 98% 98% 99%    RLE - VAC in place with good seal and suction - incisions c/d/i - foot wwp, decreased sensation at baseline, +DF/PF   Lab Results  Component Value Date   WBC 7.5 12/18/2013   HGB 12.5* 12/18/2013   HCT 35.5* 12/18/2013   MCV 84.7 12/18/2013   PLT 208 12/18/2013     Assessment/Plan:  2 Days Post-Op   - Up with PT/OT - recommending SNF - DVT ppx - SCDs, ambulation, asa - NWB right lower extremity - Pain control - well controlled - awaiting home vac approval - RN to do floor VAC change today - plan for MWF VAC changes to closure  Problem List Items Addressed This Visit   None       [place] Eduard Roux 12/20/2013, 7:37 AM [phone]

## 2013-12-20 NOTE — Progress Notes (Signed)
Physical Therapy Treatment Patient Details Name: [name] MRN: [id number] DOB: 03-26-1956 Today's Date: 12/20/2013    History of Present Illness S/P  R great toe, 2nd, and 5th ray amputation , application of VAC due to gangrene on 12/18/13. Pt is S/P L BKA  in 2/15. was on CIR for several weeks.    PT Comments    Required increased time for session as patient asking multiple questions regarding discharge plans, getting his stuff from his group home, therapy at SNF and prothesis training. Patient progressing well with transfers. Continue to recommend SNF for ongoing Physical Therapy.     Follow Up Recommendations  SNF;Supervision/Assistance - 24 hour     Equipment Recommendations       Recommendations for Other Services       Precautions / Restrictions Precautions Precautions: Fall Restrictions Weight Bearing Restrictions: Yes RLE Weight Bearing: Non weight bearing LLE Weight Bearing: Non weight bearing Other Position/Activity Restrictions: L BKA, no prosthesis    Mobility  Bed Mobility Overal bed mobility: Independent                Transfers Overall transfer level: Needs assistance   Transfers: Lateral/Scoot Transfers          Lateral/Scoot Transfers: Min guard General transfer comment: Patient able to laterally transfer without board to bed from drop arm recliner with cues for safety.   Ambulation/Gait                 Stairs            Wheelchair Mobility    Modified Rankin (Stroke Patients Only)       Balance Overall balance assessment: Independent Sitting-balance support: No upper extremity supported Sitting balance-Leahy Scale: Good                              Cognition Arousal/Alertness: Awake/alert Behavior During Therapy: WFL for tasks assessed/performed Overall Cognitive Status: Within Functional Limits for tasks assessed                      Exercises      General Comments         Pertinent Vitals/Pain Denied pain    Home Living                      Prior Function            PT Goals (current goals can now be found in the care plan section) Acute Rehab PT Goals Patient Stated Goal: to go somewhere with 24 hour care Progress towards PT goals: Progressing toward goals    Frequency  Min 3X/week    PT Plan Current plan remains appropriate    Co-evaluation             End of Session   Activity Tolerance: Patient tolerated treatment well Patient left: in bed;with call bell/phone within reach     Time: [id number] PT Time Calculation (min): 25 min  Charges:  $Therapeutic Activity: 23-37 mins                    G Codes:      Tonia Brooms Linn Clavin 12/20/2013, 3:29 PM 12/20/2013 [place] PTA [phone] pager [phone] office

## 2013-12-20 NOTE — Progress Notes (Signed)
I agree with this note.  Time in/out: 10:30-1045 Total time: 15 minutes ([place])  [place], OTR/L [id number] 12/20/2013

## 2013-12-20 NOTE — Progress Notes (Signed)
Occupational Therapy Treatment Patient Details Name: [name] MRN: [id number] DOB: 1956/07/09 Today's Date: 12/20/2013    History of present illness S/P  R great toe, 2nd, and 5th ray amputation , application of VAC due to gangrene on 12/18/13. Pt is S/P L BKA  in 2/15. was on CIR for several weeks.   OT comments  Pt is able to do his UB bathing and dressing with set-up assist and LB bathing and dressing with supervision for safety.  Pt was able to transfer from the bed to the drop arm recliner using his UB without using the transfer board. Pt stated that the MD told him he would be discharged to a SNF but didn't have a time frame.    Follow Up Recommendations  SNF    Equipment Recommendations  3 in 1 bedside comode (with drop arm )    Recommendations for Other Services      Precautions / Restrictions Precautions Precautions: Fall Restrictions Weight Bearing Restrictions: Yes RLE Weight Bearing: Non weight bearing LLE Weight Bearing: Non weight bearing Other Position/Activity Restrictions: L BKA, no prosthesis       Mobility Bed Mobility Overal bed mobility: Independent                Transfers Overall transfer level: Needs assistance   Transfers: Lateral/Scoot Transfers          Lateral/Scoot Transfers: Min guard General transfer comment: for safety and stabilize drop arm recliner    Balance Overall balance assessment: Independent Sitting-balance support: No upper extremity supported Sitting balance-Leahy Scale: Good                             ADL Overall ADL's : Needs assistance/impaired Eating/Feeding: Independent;Sitting   Grooming: Sitting;Set up   Upper Body Bathing: Set up;Sitting   Lower Body Bathing: Sitting/lateral leans;Supervison/ safety   Upper Body Dressing : Set up;Sitting   Lower Body Dressing: Sitting/lateral leans;Supervision/safety   Toilet Transfer: Min guard;Requires drop arm Toilet Transfer Details  (indicate cue type and reason): same level transfer Toileting- Clothing Manipulation and Hygiene: Set up;Sitting/lateral lean     Tub/Shower Transfer Details (indicate cue type and reason): Pt. unable to shower right now due to wound, will do sponge bath. Functional mobility during ADLs: Min guard        Vision                            Cognition   Behavior During Therapy: [facility] for tasks assessed/performed Overall Cognitive Status: Within Functional Limits for tasks assessed                                    Pertinent Vitals/ Pain       No c/o of pain.          Frequency Min 2X/week     Progress Toward Goals  OT Goals(current goals can now be found in the care plan section)  Progress towards OT goals: Progressing toward goals  Acute Rehab OT Goals Patient Stated Goal: to go somewhere with 24 hour care OT Goal Formulation: With patient Potential to Achieve Goals: Good ADL Goals Pt Will Perform Lower Body Bathing: with set-up;sitting/lateral leans Pt Will Perform Lower Body Dressing: with set-up;sitting/lateral leans Pt Will Transfer to Toilet: with transfer board;with min guard assist;bedside commode  Plan  Frequency remains appropriate          Activity Tolerance Patient tolerated treatment well   Patient Left in bed;with call bell/phone within reach           Time:  -     Charges:    Lyda Perone 12/20/2013, 3:21 PM

## 2013-12-20 NOTE — Care Management Note (Signed)
CARE MANAGEMENT NOTE 12/20/2013  Patient:  [name], [name]   Account Number:  [id number]  Date Initiated:  12/19/2013  Documentation initiated by:  Ricki Miller  Subjective/Objective Assessment:   58 yr old male admitted with right forefoot gangrene, s/p amputation of toes right foot.     Action/Plan:   patient is from [facility] from Home group home, will require shortter. He is non-weightbearing right foot SNF.   Anticipated DC Date:  12/21/2013   Anticipated DC Plan:  SKILLED NURSING FACILITY  In-house referral  Clinical Social Worker      DC Planning Services  CM consult      [facility] Choice  NA   Choice offered to / List presented to:          [facility] arranged  NA      Status of service:  Completed, signed off Medicare Important Message given?   (If response is "NO", the following Medicare IM given date fields will be blank)    12/20/13 11:45 AM Ricki Miller, RN BSN Case Manager Case manager spoke with patient concerning need for wound vac. Case manager placed authorization for for wound vac on patient's chart. Will fax info for charity vac to [facility] @ [phone], after Dr. Erlinda Hong completes.

## 2013-12-20 NOTE — Progress Notes (Signed)
Clinical Social Work Department BRIEF PSYCHOSOCIAL ASSESSMENT 12/20/2013  Patient:  [name], [name]     Account Number:  [id number]     Admit date:  12/18/2013  Clinical Social Worker:  Megan Salon  Date/Time:  12/20/2013 03:43 PM  Referred by:  Care Management  Date Referred:  12/20/2013 Referred for  SNF Placement   Other Referral:   Interview type:  Patient Other interview type:    PSYCHOSOCIAL DATA Living Status:  ALONE Admitted from facility:   Level of care:   Primary support name:  Bethel Born Primary support relationship to patient:  SIBLING Degree of support available:   Okay    CURRENT CONCERNS Current Concerns  Post-Acute Placement   Other Concerns:    SOCIAL WORK ASSESSMENT / PLAN Clinical Social Worker received referral for SNF placement at d/c. CSW introduced self and explained reason for visit. CSW explained SNF process and provided SNF packet to patient and explained that because patient does not insurance, social worker may need to extend search outside of the county. Patient reported he is agreeable for SNF placement and is okay with going outside of the county. CSW will complete FL2 for MD's signature and will update patient  when bed offers are received.   Assessment/plan status:  Psychosocial Support/Ongoing Assessment of Needs Other assessment/ plan:   Information/referral to community resources:   SNF information    PATIENT'S/FAMILY'S RESPONSE TO PLAN OF CARE: Patient reported he is good with going to rehab because he feels like he really needs to get stronger. Patient agreeable to going to a SNF.        Jeanette Caprice, MSW, [place]

## 2013-12-20 NOTE — Plan of Care (Signed)
Problem: Acute Rehab OT Goals (only OT should resolve) Goal: Pt. Will Transfer To Toilet With drop down arm

## 2013-12-20 NOTE — Progress Notes (Addendum)
Clinical Social Work Department CLINICAL SOCIAL WORK PLACEMENT NOTE 12/20/2013  Patient:  [name], [name]  Account Number:  [id number] Admit date:  12/18/2013  Clinical Social Worker:  Megan Salon  Date/time:  12/20/2013 03:46 PM  Clinical Social Work is seeking post-discharge placement for this patient at the following level of care:   [place]   (*CSW will update this form in Epic as items are completed)   12/20/2013  Patient/family provided with [place] Department of Clinical Social Work's list of facilities offering this level of care within the geographic area requested by the patient (or if unable, by the patient's family).  12/20/2013  Patient/family informed of their freedom to choose among providers that offer the needed level of care, that participate in Medicare, Medicaid or managed care program needed by the patient, have an available bed and are willing to accept the patient.  12/20/2013  Patient/family informed of [facility]' ownership interest in [facility], as well as of the fact that they are under no obligation to receive care at this facility.  PASARR submitted to EDS on 12/20/2013 PASARR number received from EDS on   FL2 transmitted to all facilities in geographic area requested by pt/family on  12/20/2013 FL2 transmitted to all facilities within larger geographic area on   Patient informed that his/her managed care company has contracts with or will negotiate with  certain facilities, including the following:     Patient/family informed of bed offers received:   Patient chooses bed at [facility]   Physician recommends and patient chooses bed at    Patient to be transferred to [place] on 12/22/13- Blima Rich, [place]   Patient to be transferred to facility by [place], [place]   The following physician request were entered in Epic:   Additional Comments:  Jeanette Caprice, MSW, [place]

## 2013-12-21 LAB — BASIC METABOLIC PANEL
BUN: 16 mg/dL (ref 6–23)
CO2: 25 meq/L (ref 19–32)
CREATININE: 0.88 mg/dL (ref 0.50–1.35)
Calcium: 9 mg/dL (ref 8.4–10.5)
Chloride: 109 mEq/L (ref 96–112)
GFR calc Af Amer: >90 mL/min (ref >90)
GFR calc non Af Amer: >90 mL/min (ref >90)
GLUCOSE: 112 mg/dL — AB (ref 70–99)
Potassium: 3.7 mEq/L (ref 3.7–5.3)
Sodium: 146 mEq/L (ref 137–147)

## 2013-12-21 LAB — GLUCOSE, CAPILLARY
GLUCOSE-CAPILLARY: 114 mg/dL — AB (ref 70–99)
Glucose-Capillary: 118 mg/dL — ABNORMAL HIGH (ref 70–99)
Glucose-Capillary: 132 mg/dL — ABNORMAL HIGH (ref 70–99)
Glucose-Capillary: 46 mg/dL — ABNORMAL LOW (ref 70–99)
Glucose-Capillary: 92 mg/dL (ref 70–99)

## 2013-12-21 MED ORDER — HYDROCODONE-ACETAMINOPHEN 5-325 MG PO TABS: 1.0000 | ORAL_TABLET | Freq: Four times a day (QID) | ORAL | Status: DC | PRN

## 2013-12-21 MED ORDER — DEXTROSE 50 % IV SOLN
INTRAVENOUS | Status: AC
  Administered 2013-12-21: 22:00:00
  Filled 2013-12-21: qty 50

## 2013-12-21 NOTE — Discharge Summary (Signed)
Physician Discharge Summary      Patient ID: [name] MRN: [id number] DOB/AGE: 1956/06/30 58 y.o.  Admit date: 12/18/2013 Discharge date: 12/22/2013  Admission Diagnoses:  Gangrene of toe  Discharge Diagnoses:  Principal Problem:   Gangrene of toe Active Problems:   Toe gangrene   Past Medical History  Diagnosis Date  . Hepatitis   . Hypertension   . Pneumonia     HX OF PNA  . Diabetes mellitus without complication     PATIENT JUST LEARNED HE WAS DIABETIC  . Necrotizing fasciitis   . Hyponatremia   . Hypokalemia     Surgeries: Procedure(s): RIGHT FOOT 1,2, TOE AMPUTATION  5th toe RAY AMPUTATION on 12/18/2013   Consultants (if any):    Discharged Condition: Improved  Hospital Course: [name] is an 58 y.o. male who was admitted 12/18/2013 with a diagnosis of Gangrene of toe and went to the operating room on 12/18/2013 and underwent the above named procedures.  He had an uneventful hospital course.  He tolerated the floor vac change well.  He was discharged in stable condition.  He was given perioperative antibiotics:      Anti-infectives   Start     Dose/Rate Route Frequency Ordered Stop   12/18/13 0947  ceFAZolin (ANCEF) 2-3 GM-% IVPB SOLR    Comments:  Scronce, Trina   : cabinet override      12/18/13 0947 12/18/13 1115   12/18/13 0600  ceFAZolin (ANCEF) IVPB 2 g/50 mL premix  Status:  Discontinued     2 g 100 mL/hr over 30 Minutes Intravenous On call to O.R. 12/17/13 1425 12/18/13 1051    .  He was given sequential compression devices, early ambulation, and aspirin for DVT prophylaxis.  He benefited maximally from the hospital stay and there were no complications.    Recent vital signs:  Filed Vitals:   12/22/13 0646  BP: 126/62  Pulse: 77  Temp: 98.1 F (36.7 C)  Resp: 20    Recent laboratory studies:  Lab Results  Component Value Date   HGB 12.5* 12/18/2013   HGB 13.7 11/11/2013   HGB 11.3* 10/14/2013   Lab Results  Component Value  Date   WBC 7.5 12/18/2013   PLT 208 12/18/2013   No results found for this basename: INR   Lab Results  Component Value Date   NA 146 12/21/2013   K 3.7 12/21/2013   CL 109 12/21/2013   CO2 25 12/21/2013   BUN 16 12/21/2013   CREATININE 0.88 12/21/2013   GLUCOSE 112* 12/21/2013    Discharge Medications:     Medication List         aspirin EC 81 MG tablet  Take 81 mg by mouth daily.     HYDROcodone-acetaminophen 5-325 MG per tablet  Commonly known as:  NORCO  Take 1-2 tablets by mouth every 6 (six) hours as needed.     insulin aspart protamine- aspart (70-30) 100 UNIT/ML injection  Commonly known as:  NOVOLOG MIX 70/30  Inject 5-20 Units into the skin 3 (three) times daily. 20 units in am , 20 units at 5 pm and 5 units at 8 pm .     multivitamin with minerals Tabs tablet  Take 1 tablet by mouth daily.        Diagnostic Studies: Dg Chest 2 View  12/18/2013   CLINICAL DATA:  Pneumonia, hypertension, preoperative exam  EXAM: CHEST  2 VIEW  COMPARISON:  DG CHEST 2 VIEW dated 10/02/2013  FINDINGS: Improved aeration is noted bilaterally. Areas of bilateral lower lobe curvilinear probable scarring or atelectasis are noted. No pleural effusion. No acute osseous finding. Heart size is normal. Cholecystectomy clips are noted.  IMPRESSION: Near complete resolution of previously seen right lower lobe airspace opacity, with probable residual bibasilar atelectasis or scarring.   Electronically Signed   By: [name] M.D.   On: 12/18/2013 14:44    Disposition: Skilled nursing home. [facility]  Discharge Instructions   Call MD / Call 911    Complete by:  As directed   If you experience chest pain or shortness of breath, CALL 911 and be transported to the hospital emergency room.  If you develope a fever above 101.5 F, pus (white drainage) or increased drainage or redness at the wound, or calf pain, call your surgeon's office.     Constipation Prevention    Complete by:  As directed    Drink plenty of fluids.  Prune juice may be helpful.  You may use a stool softener, such as Colace (over the counter) 100 mg twice a day.  Use MiraLax (over the counter) for constipation as needed.     Diet - low sodium heart healthy    Complete by:  As directed      Diet Carb Modified    Complete by:  As directed      Driving restrictions    Complete by:  As directed   No driving while taking narcotic pain meds.     Increase activity slowly as tolerated    Complete by:  As directed      Non weight bearing    Complete by:  As directed            Follow-up Information   Follow up with Ian Payment, MD In 2 weeks.   Specialty:  Orthopedic Surgery   Contact information:   [place] [id number] [phone]        Signed: Biagio [name] 12/22/2013, 11:45 AM

## 2013-12-21 NOTE — Progress Notes (Signed)
Subjective: 3 Days Post-Op Procedure(s) (LRB): RIGHT FOOT 1,2, TOE AMPUTATION  5th toe RAY AMPUTATION (Right) Patient reports pain as mild.    Objective: Vital signs in last 24 hours: Temp:  [97.9 F (36.6 C)-98.7 F (37.1 C)] 97.9 F (36.6 C) (05/23 0601) Pulse Rate:  [75-83] 75 (05/23 0601) Resp:  [16-18] 18 (05/23 0601) BP: (128-145)/(67-85) 128/67 mmHg (05/23 0601) SpO2:  [99 %-100 %] 100 % (05/23 0601)  Intake/Output from previous day: 05/22 0701 - 05/23 0700 In: 1080 [P.O.:1080] Out: 2800 [Urine:2800] Intake/Output this shift:    No results found for this basename: HGB,  in the last 72 hours No results found for this basename: WBC, RBC, HCT, PLT,  in the last 72 hours  Recent Labs  12/20/13 0730 12/21/13 0352  NA 144 146  K 3.7 3.7  CL 108 109  CO2 24 25  BUN 15 16  CREATININE 0.89 0.88  GLUCOSE 170* 112*  CALCIUM 9.0 9.0   No results found for this basename: LABPT, INR,  in the last 72 hours  Vac seal good.   CBG  114.   Assessment/Plan: 3 Days Post-Op Procedure(s) (LRB): RIGHT FOOT 1,2, TOE AMPUTATION  5th toe RAY AMPUTATION (Right) Discharge to SNF  Marybelle Killings 12/21/2013, 11:05 AM

## 2013-12-21 NOTE — Progress Notes (Signed)
Patient is agreeable to rehab at [facility]. Janie admissions coordinator at [facility] faxed paper work to [place] representative (CSW) for patient to complete. Patient completed paper work and [place] faxed it to [place]. Patient will D/C to [facility] on Sunday 5/24. CSW will assist with D/C tomorrow.   Blima Rich, [place] Weekend CSW [phone]

## 2013-12-21 NOTE — Progress Notes (Signed)
Clinical Education officer, museum (CSW) received call from Chief Strategy Officer asking for placement update. Patient does not have insurance and will be an LOG and currently has wound vac. CSW contacted [facility] and [facility] who reported that they could not accept patient this weekend because they do not have access to a wound vac. CSW will continue to follow and assist as needed.   Blima Rich, [place] Weekend CSW [phone]

## 2013-12-22 LAB — GLUCOSE, CAPILLARY
GLUCOSE-CAPILLARY: 157 mg/dL — AB (ref 70–99)
GLUCOSE-CAPILLARY: 157 mg/dL — AB (ref 70–99)
GLUCOSE-CAPILLARY: 69 mg/dL — AB (ref 70–99)

## 2013-12-22 NOTE — Progress Notes (Signed)
Subjective: 4 Days Post-Op Procedure(s) (LRB): RIGHT FOOT 1,2, TOE AMPUTATION  5th toe RAY AMPUTATION (Right) Patient reports pain as mild.    Objective: Vital signs in last 24 hours: Temp:  [98 F (36.7 C)-98.6 F (37 C)] 98.1 F (36.7 C) (05/24 0646) Pulse Rate:  [72-77] 77 (05/24 0646) Resp:  [18-20] 20 (05/24 0646) BP: (126-128)/(60-65) 126/62 mmHg (05/24 0646) SpO2:  [98 %-99 %] 98 % (05/24 0646)  Intake/Output from previous day: 05/23 0701 - 05/24 0700 In: 1440 [P.O.:1440] Out: 1525 [Urine:1525] Intake/Output this shift: Total I/O In: 600 [P.O.:600] Out: -   No results found for this basename: HGB,  in the last 72 hours No results found for this basename: WBC, RBC, HCT, PLT,  in the last 72 hours  Recent Labs  12/20/13 0730 12/21/13 0352  NA 144 146  K 3.7 3.7  CL 108 109  CO2 24 25  BUN 15 16  CREATININE 0.89 0.88  GLUCOSE 170* 112*  CALCIUM 9.0 9.0   No results found for this basename: LABPT, INR,  in the last 72 hours  Incision: dressing C/D/I  Assessment/Plan: 4 Days Post-Op Procedure(s) (LRB): RIGHT FOOT 1,2, TOE AMPUTATION  5th toe RAY AMPUTATION (Right) Discharge to SNF [place] 12/22/2013, 11:47 AM

## 2013-12-22 NOTE — Progress Notes (Signed)
Patient is medically stable to D/C to [facility] today. Per [facility] admissions coordinator at [place] patient is going to room 308-B. Clinical Education officer, museum (CSW) prepared D/C packet and sent Janie D/C summary via carefinder. RN will call report and CSW arranged EMS for transport. Patient is a LOG approved by clinical social work Social worker. Patient will transport with hospital's wound vac because [facility] can't order wound vac on the weekends. Clinical social work Mudlogger agreed to pick up wound vac from [employer] on Tuesday 12/24/13. Patient is aware of above. Patient reported that he has called his sister and made her aware of above. Nursing and MD are aware of above. Please reconsult if further social work needs arise. CSW signing off.   Blima Rich, [place] Weekend CSW [phone]

## 2014-06-16 ENCOUNTER — Other Ambulatory Visit: Payer: Self-pay | Admitting: Internal Medicine

## 2014-06-16 DIAGNOSIS — I861 Scrotal varices: Secondary | ICD-10-CM

## 2014-07-17 ENCOUNTER — Encounter (HOSPITAL_BASED_OUTPATIENT_CLINIC_OR_DEPARTMENT_OTHER): Payer: Medicaid Other | Attending: Internal Medicine

## 2014-09-06 IMAGING — CR DG ABDOMEN 1V
1 series · 1 of 1 positions shown · non-contrast
Comparison: None.

CLINICAL DATA: 57-year-old male nausea vomiting loose stool. Recent
left lower extremity surgery.

EXAM:
ABDOMEN - 1 VIEW

[t abdomen supine]
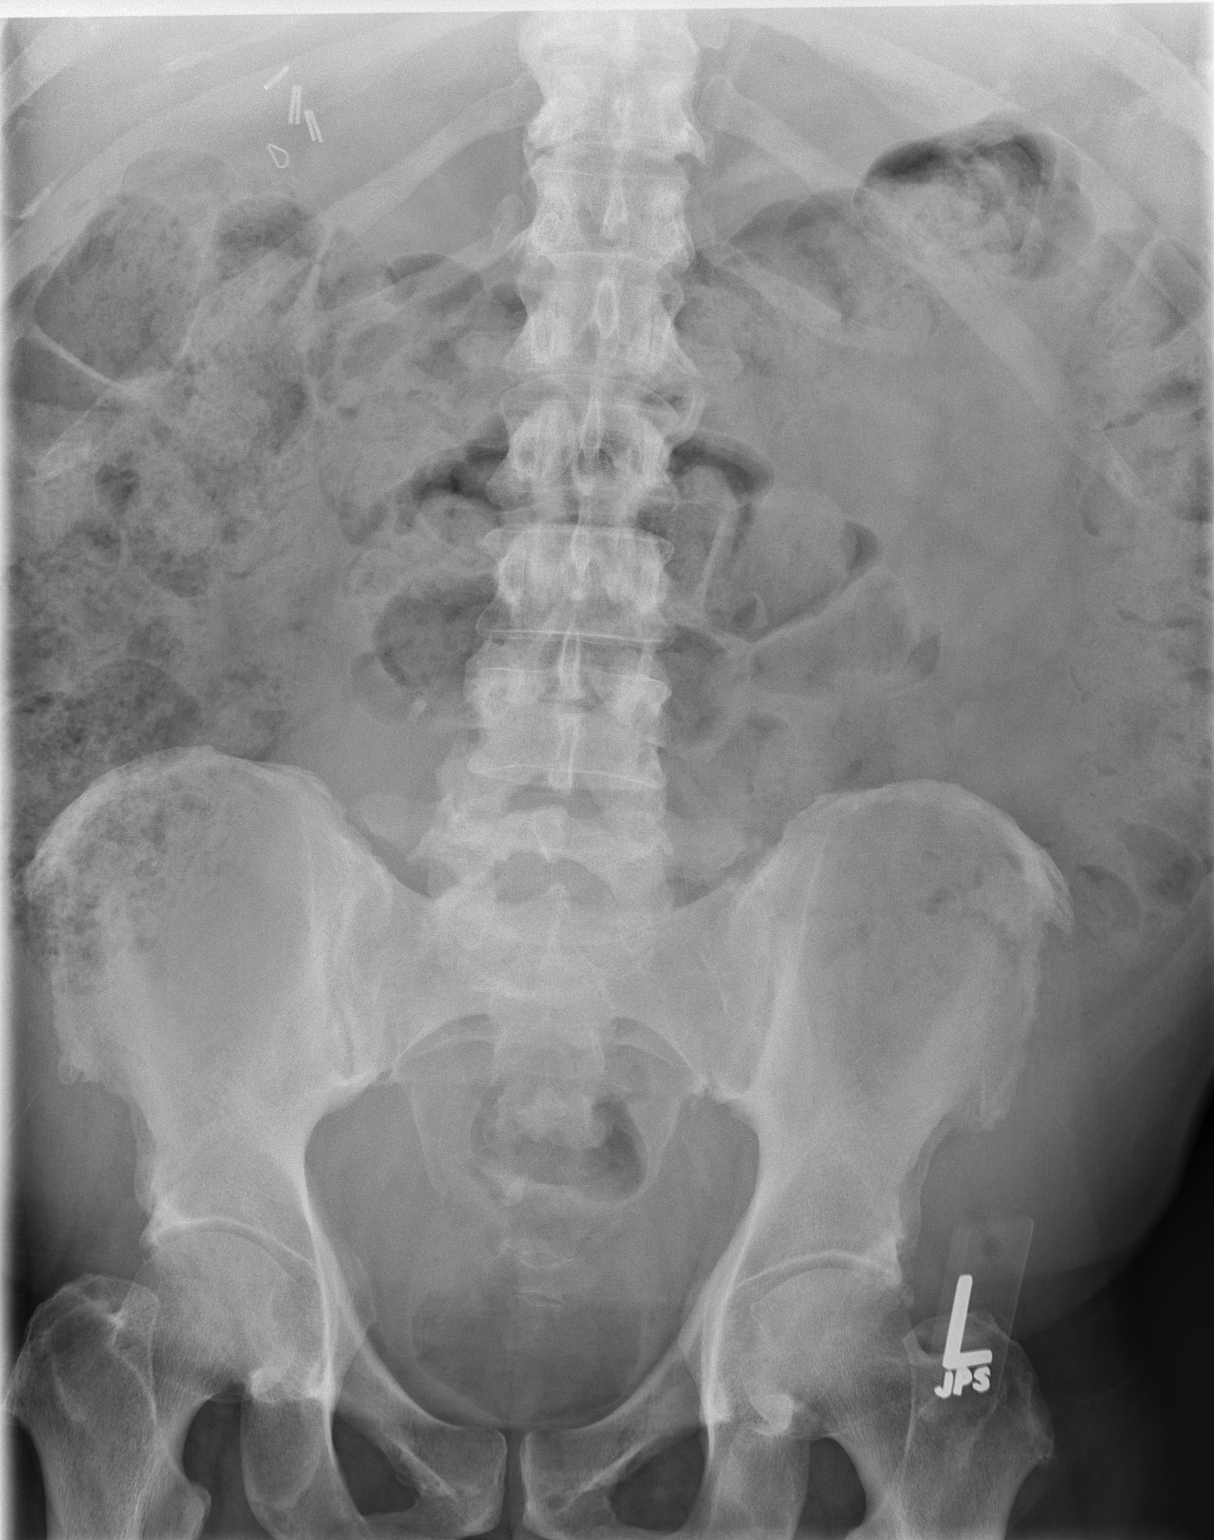

[1 of 1 positions shown; findings below may reference images not displayed]

FINDINGS: Single supine view at 3538 hrs. Large body habitus, the entire
abdomen is not visible. Non obstructed bowel gas pattern. Right
upper quadrant surgical clips. Visible abdominal and pelvic visceral
contours are within normal limits. No acute osseous abnormality
identified.
IMPRESSION: Non obstructed bowel gas pattern.

## 2014-09-12 IMAGING — CR DG CHEST 2V
1 series · 1 of 1 positions shown · non-contrast
Comparison: 09/15/2013

CLINICAL DATA: Hypoxia

EXAM:
CHEST  2 VIEW

[w chest lat]
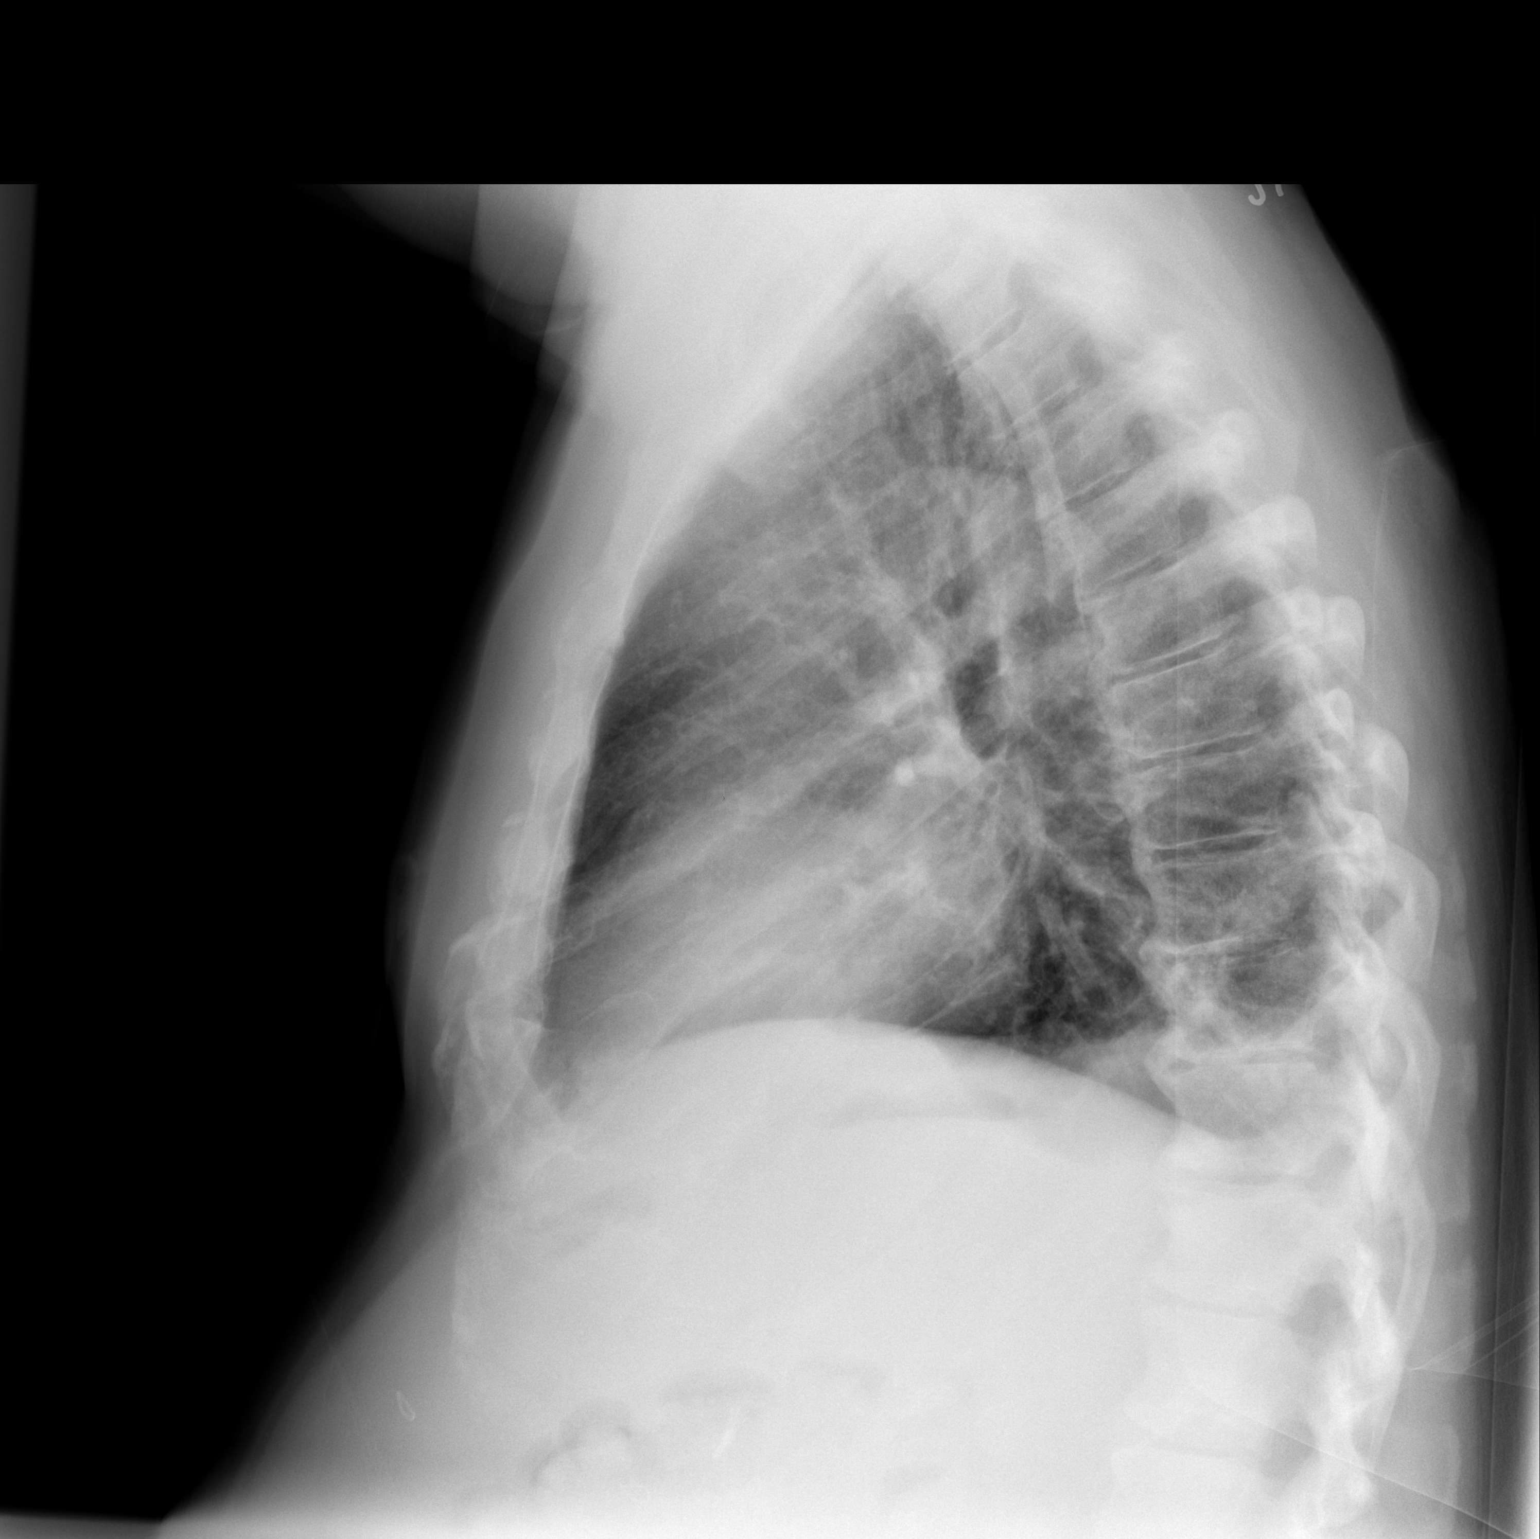

[1 of 1 positions shown; findings below may reference images not displayed]

FINDINGS: Cardiomediastinal silhouette is stable. Mild degenerative changes
thoracic spine. There is left base posteriorly atelectasis or
infiltrate best seen on lateral view. No pulmonary edema.
IMPRESSION: No pulmonary edema. Left base posteriorly atelectasis or infiltrate
best seen on lateral view.

## 2016-07-01 ENCOUNTER — Encounter (INDEPENDENT_AMBULATORY_CARE_PROVIDER_SITE_OTHER): Payer: Medicare Other | Admitting: Ophthalmology

## 2016-07-01 DIAGNOSIS — H2513 Age-related nuclear cataract, bilateral: Secondary | ICD-10-CM

## 2016-07-01 DIAGNOSIS — E113313 Type 2 diabetes mellitus with moderate nonproliferative diabetic retinopathy with macular edema, bilateral: Secondary | ICD-10-CM | POA: Diagnosis not present

## 2016-07-01 DIAGNOSIS — E11311 Type 2 diabetes mellitus with unspecified diabetic retinopathy with macular edema: Secondary | ICD-10-CM

## 2016-07-01 DIAGNOSIS — H43813 Vitreous degeneration, bilateral: Secondary | ICD-10-CM | POA: Diagnosis not present

## 2016-07-21 ENCOUNTER — Encounter (INDEPENDENT_AMBULATORY_CARE_PROVIDER_SITE_OTHER): Payer: Medicare Other | Admitting: Ophthalmology

## 2016-07-21 DIAGNOSIS — H43813 Vitreous degeneration, bilateral: Secondary | ICD-10-CM

## 2016-07-21 DIAGNOSIS — E11311 Type 2 diabetes mellitus with unspecified diabetic retinopathy with macular edema: Secondary | ICD-10-CM

## 2016-07-21 DIAGNOSIS — E113511 Type 2 diabetes mellitus with proliferative diabetic retinopathy with macular edema, right eye: Secondary | ICD-10-CM | POA: Diagnosis not present

## 2016-07-21 DIAGNOSIS — H2513 Age-related nuclear cataract, bilateral: Secondary | ICD-10-CM

## 2016-07-21 DIAGNOSIS — E113312 Type 2 diabetes mellitus with moderate nonproliferative diabetic retinopathy with macular edema, left eye: Secondary | ICD-10-CM | POA: Diagnosis not present

## 2016-08-15 ENCOUNTER — Encounter (INDEPENDENT_AMBULATORY_CARE_PROVIDER_SITE_OTHER): Payer: Medicare Other | Admitting: Ophthalmology

## 2016-08-15 DIAGNOSIS — E113511 Type 2 diabetes mellitus with proliferative diabetic retinopathy with macular edema, right eye: Secondary | ICD-10-CM

## 2016-08-15 DIAGNOSIS — E113312 Type 2 diabetes mellitus with moderate nonproliferative diabetic retinopathy with macular edema, left eye: Secondary | ICD-10-CM

## 2016-08-15 DIAGNOSIS — E11311 Type 2 diabetes mellitus with unspecified diabetic retinopathy with macular edema: Secondary | ICD-10-CM

## 2016-08-15 DIAGNOSIS — H43813 Vitreous degeneration, bilateral: Secondary | ICD-10-CM | POA: Diagnosis not present

## 2016-09-12 ENCOUNTER — Encounter (INDEPENDENT_AMBULATORY_CARE_PROVIDER_SITE_OTHER): Payer: Medicare Other | Admitting: Ophthalmology

## 2016-09-12 DIAGNOSIS — H35033 Hypertensive retinopathy, bilateral: Secondary | ICD-10-CM

## 2016-09-12 DIAGNOSIS — H43813 Vitreous degeneration, bilateral: Secondary | ICD-10-CM | POA: Diagnosis not present

## 2016-09-12 DIAGNOSIS — H2513 Age-related nuclear cataract, bilateral: Secondary | ICD-10-CM | POA: Diagnosis not present

## 2016-09-12 DIAGNOSIS — E113511 Type 2 diabetes mellitus with proliferative diabetic retinopathy with macular edema, right eye: Secondary | ICD-10-CM

## 2016-09-12 DIAGNOSIS — E11311 Type 2 diabetes mellitus with unspecified diabetic retinopathy with macular edema: Secondary | ICD-10-CM

## 2016-09-12 DIAGNOSIS — I10 Essential (primary) hypertension: Secondary | ICD-10-CM

## 2016-09-12 DIAGNOSIS — E113392 Type 2 diabetes mellitus with moderate nonproliferative diabetic retinopathy without macular edema, left eye: Secondary | ICD-10-CM | POA: Diagnosis not present

## 2016-10-03 ENCOUNTER — Other Ambulatory Visit (INDEPENDENT_AMBULATORY_CARE_PROVIDER_SITE_OTHER): Payer: Medicare Other | Admitting: Ophthalmology

## 2016-10-03 DIAGNOSIS — E11311 Type 2 diabetes mellitus with unspecified diabetic retinopathy with macular edema: Secondary | ICD-10-CM

## 2016-10-03 DIAGNOSIS — E113511 Type 2 diabetes mellitus with proliferative diabetic retinopathy with macular edema, right eye: Secondary | ICD-10-CM

## 2016-10-10 ENCOUNTER — Encounter (INDEPENDENT_AMBULATORY_CARE_PROVIDER_SITE_OTHER): Payer: Medicare Other | Admitting: Ophthalmology

## 2016-10-14 ENCOUNTER — Encounter (INDEPENDENT_AMBULATORY_CARE_PROVIDER_SITE_OTHER): Payer: Medicare Other | Admitting: Ophthalmology

## 2016-10-14 DIAGNOSIS — H43813 Vitreous degeneration, bilateral: Secondary | ICD-10-CM

## 2016-10-14 DIAGNOSIS — I10 Essential (primary) hypertension: Secondary | ICD-10-CM | POA: Diagnosis not present

## 2016-10-14 DIAGNOSIS — E11311 Type 2 diabetes mellitus with unspecified diabetic retinopathy with macular edema: Secondary | ICD-10-CM

## 2016-10-14 DIAGNOSIS — E113312 Type 2 diabetes mellitus with moderate nonproliferative diabetic retinopathy with macular edema, left eye: Secondary | ICD-10-CM

## 2016-10-14 DIAGNOSIS — H2513 Age-related nuclear cataract, bilateral: Secondary | ICD-10-CM | POA: Diagnosis not present

## 2016-10-14 DIAGNOSIS — E113511 Type 2 diabetes mellitus with proliferative diabetic retinopathy with macular edema, right eye: Secondary | ICD-10-CM | POA: Diagnosis not present

## 2016-10-14 DIAGNOSIS — H35033 Hypertensive retinopathy, bilateral: Secondary | ICD-10-CM | POA: Diagnosis not present

## 2016-11-14 ENCOUNTER — Encounter (INDEPENDENT_AMBULATORY_CARE_PROVIDER_SITE_OTHER): Payer: Medicare Other | Admitting: Ophthalmology

## 2016-11-14 DIAGNOSIS — E11311 Type 2 diabetes mellitus with unspecified diabetic retinopathy with macular edema: Secondary | ICD-10-CM | POA: Diagnosis not present

## 2016-11-14 DIAGNOSIS — E113511 Type 2 diabetes mellitus with proliferative diabetic retinopathy with macular edema, right eye: Secondary | ICD-10-CM | POA: Diagnosis not present

## 2016-11-14 DIAGNOSIS — E113312 Type 2 diabetes mellitus with moderate nonproliferative diabetic retinopathy with macular edema, left eye: Secondary | ICD-10-CM | POA: Diagnosis not present

## 2016-11-14 DIAGNOSIS — H43813 Vitreous degeneration, bilateral: Secondary | ICD-10-CM | POA: Diagnosis not present

## 2016-12-12 ENCOUNTER — Encounter (INDEPENDENT_AMBULATORY_CARE_PROVIDER_SITE_OTHER): Payer: Medicare Other | Admitting: Ophthalmology

## 2016-12-19 ENCOUNTER — Encounter (INDEPENDENT_AMBULATORY_CARE_PROVIDER_SITE_OTHER): Payer: Medicare Other | Admitting: Ophthalmology

## 2016-12-19 DIAGNOSIS — H2513 Age-related nuclear cataract, bilateral: Secondary | ICD-10-CM | POA: Diagnosis not present

## 2016-12-19 DIAGNOSIS — E113312 Type 2 diabetes mellitus with moderate nonproliferative diabetic retinopathy with macular edema, left eye: Secondary | ICD-10-CM

## 2016-12-19 DIAGNOSIS — E113511 Type 2 diabetes mellitus with proliferative diabetic retinopathy with macular edema, right eye: Secondary | ICD-10-CM | POA: Diagnosis not present

## 2016-12-19 DIAGNOSIS — E11311 Type 2 diabetes mellitus with unspecified diabetic retinopathy with macular edema: Secondary | ICD-10-CM

## 2016-12-19 DIAGNOSIS — H43813 Vitreous degeneration, bilateral: Secondary | ICD-10-CM

## 2017-01-09 ENCOUNTER — Other Ambulatory Visit (INDEPENDENT_AMBULATORY_CARE_PROVIDER_SITE_OTHER): Payer: Medicare Other | Admitting: Ophthalmology

## 2017-01-09 DIAGNOSIS — E113511 Type 2 diabetes mellitus with proliferative diabetic retinopathy with macular edema, right eye: Secondary | ICD-10-CM

## 2017-01-09 DIAGNOSIS — E11311 Type 2 diabetes mellitus with unspecified diabetic retinopathy with macular edema: Secondary | ICD-10-CM

## 2017-01-16 ENCOUNTER — Encounter (INDEPENDENT_AMBULATORY_CARE_PROVIDER_SITE_OTHER): Payer: Medicare Other | Admitting: Ophthalmology

## 2017-01-16 DIAGNOSIS — E113511 Type 2 diabetes mellitus with proliferative diabetic retinopathy with macular edema, right eye: Secondary | ICD-10-CM | POA: Diagnosis not present

## 2017-01-16 DIAGNOSIS — H43813 Vitreous degeneration, bilateral: Secondary | ICD-10-CM | POA: Diagnosis not present

## 2017-01-16 DIAGNOSIS — E113392 Type 2 diabetes mellitus with moderate nonproliferative diabetic retinopathy without macular edema, left eye: Secondary | ICD-10-CM

## 2017-01-16 DIAGNOSIS — E11311 Type 2 diabetes mellitus with unspecified diabetic retinopathy with macular edema: Secondary | ICD-10-CM

## 2017-02-06 ENCOUNTER — Other Ambulatory Visit (INDEPENDENT_AMBULATORY_CARE_PROVIDER_SITE_OTHER): Payer: Medicare Other | Admitting: Ophthalmology

## 2017-02-06 DIAGNOSIS — E11311 Type 2 diabetes mellitus with unspecified diabetic retinopathy with macular edema: Secondary | ICD-10-CM | POA: Diagnosis not present

## 2017-02-06 DIAGNOSIS — E113511 Type 2 diabetes mellitus with proliferative diabetic retinopathy with macular edema, right eye: Secondary | ICD-10-CM

## 2017-02-13 ENCOUNTER — Encounter (INDEPENDENT_AMBULATORY_CARE_PROVIDER_SITE_OTHER): Payer: Medicare Other | Admitting: Ophthalmology

## 2017-02-13 DIAGNOSIS — E113392 Type 2 diabetes mellitus with moderate nonproliferative diabetic retinopathy without macular edema, left eye: Secondary | ICD-10-CM

## 2017-02-13 DIAGNOSIS — E113511 Type 2 diabetes mellitus with proliferative diabetic retinopathy with macular edema, right eye: Secondary | ICD-10-CM

## 2017-02-13 DIAGNOSIS — E11311 Type 2 diabetes mellitus with unspecified diabetic retinopathy with macular edema: Secondary | ICD-10-CM

## 2017-02-13 DIAGNOSIS — H43813 Vitreous degeneration, bilateral: Secondary | ICD-10-CM

## 2017-02-13 DIAGNOSIS — H2513 Age-related nuclear cataract, bilateral: Secondary | ICD-10-CM

## 2017-03-13 ENCOUNTER — Encounter (INDEPENDENT_AMBULATORY_CARE_PROVIDER_SITE_OTHER): Payer: Medicare Other | Admitting: Ophthalmology

## 2017-03-13 DIAGNOSIS — E113392 Type 2 diabetes mellitus with moderate nonproliferative diabetic retinopathy without macular edema, left eye: Secondary | ICD-10-CM

## 2017-03-13 DIAGNOSIS — E113511 Type 2 diabetes mellitus with proliferative diabetic retinopathy with macular edema, right eye: Secondary | ICD-10-CM

## 2017-03-13 DIAGNOSIS — E11311 Type 2 diabetes mellitus with unspecified diabetic retinopathy with macular edema: Secondary | ICD-10-CM | POA: Diagnosis not present

## 2017-03-13 DIAGNOSIS — H2513 Age-related nuclear cataract, bilateral: Secondary | ICD-10-CM | POA: Diagnosis not present

## 2017-04-10 ENCOUNTER — Encounter (INDEPENDENT_AMBULATORY_CARE_PROVIDER_SITE_OTHER): Payer: Medicare Other | Admitting: Ophthalmology

## 2017-04-10 DIAGNOSIS — E113511 Type 2 diabetes mellitus with proliferative diabetic retinopathy with macular edema, right eye: Secondary | ICD-10-CM | POA: Diagnosis not present

## 2017-04-10 DIAGNOSIS — H43813 Vitreous degeneration, bilateral: Secondary | ICD-10-CM | POA: Diagnosis not present

## 2017-04-10 DIAGNOSIS — E11311 Type 2 diabetes mellitus with unspecified diabetic retinopathy with macular edema: Secondary | ICD-10-CM

## 2017-04-10 DIAGNOSIS — E113312 Type 2 diabetes mellitus with moderate nonproliferative diabetic retinopathy with macular edema, left eye: Secondary | ICD-10-CM

## 2017-05-08 ENCOUNTER — Encounter (INDEPENDENT_AMBULATORY_CARE_PROVIDER_SITE_OTHER): Payer: Medicare Other | Admitting: Ophthalmology

## 2017-05-08 DIAGNOSIS — E113511 Type 2 diabetes mellitus with proliferative diabetic retinopathy with macular edema, right eye: Secondary | ICD-10-CM

## 2017-05-08 DIAGNOSIS — E113392 Type 2 diabetes mellitus with moderate nonproliferative diabetic retinopathy without macular edema, left eye: Secondary | ICD-10-CM

## 2017-05-08 DIAGNOSIS — H43813 Vitreous degeneration, bilateral: Secondary | ICD-10-CM | POA: Diagnosis not present

## 2017-05-08 DIAGNOSIS — H2513 Age-related nuclear cataract, bilateral: Secondary | ICD-10-CM

## 2017-05-08 DIAGNOSIS — E11311 Type 2 diabetes mellitus with unspecified diabetic retinopathy with macular edema: Secondary | ICD-10-CM | POA: Diagnosis not present

## 2017-06-05 ENCOUNTER — Encounter (INDEPENDENT_AMBULATORY_CARE_PROVIDER_SITE_OTHER): Payer: Medicare Other | Admitting: Ophthalmology

## 2017-06-05 DIAGNOSIS — E113392 Type 2 diabetes mellitus with moderate nonproliferative diabetic retinopathy without macular edema, left eye: Secondary | ICD-10-CM | POA: Diagnosis not present

## 2017-06-05 DIAGNOSIS — E11311 Type 2 diabetes mellitus with unspecified diabetic retinopathy with macular edema: Secondary | ICD-10-CM

## 2017-06-05 DIAGNOSIS — E113511 Type 2 diabetes mellitus with proliferative diabetic retinopathy with macular edema, right eye: Secondary | ICD-10-CM | POA: Diagnosis not present

## 2017-06-05 DIAGNOSIS — H43813 Vitreous degeneration, bilateral: Secondary | ICD-10-CM

## 2017-07-03 ENCOUNTER — Encounter (INDEPENDENT_AMBULATORY_CARE_PROVIDER_SITE_OTHER): Payer: Medicare Other | Admitting: Ophthalmology

## 2017-07-03 DIAGNOSIS — E113511 Type 2 diabetes mellitus with proliferative diabetic retinopathy with macular edema, right eye: Secondary | ICD-10-CM

## 2017-07-03 DIAGNOSIS — E113312 Type 2 diabetes mellitus with moderate nonproliferative diabetic retinopathy with macular edema, left eye: Secondary | ICD-10-CM

## 2017-07-03 DIAGNOSIS — H2513 Age-related nuclear cataract, bilateral: Secondary | ICD-10-CM

## 2017-07-03 DIAGNOSIS — H43813 Vitreous degeneration, bilateral: Secondary | ICD-10-CM

## 2017-07-03 DIAGNOSIS — E11311 Type 2 diabetes mellitus with unspecified diabetic retinopathy with macular edema: Secondary | ICD-10-CM

## 2017-08-07 ENCOUNTER — Encounter (INDEPENDENT_AMBULATORY_CARE_PROVIDER_SITE_OTHER): Payer: Medicare Other | Admitting: Ophthalmology

## 2017-08-07 DIAGNOSIS — E113392 Type 2 diabetes mellitus with moderate nonproliferative diabetic retinopathy without macular edema, left eye: Secondary | ICD-10-CM

## 2017-08-07 DIAGNOSIS — E11311 Type 2 diabetes mellitus with unspecified diabetic retinopathy with macular edema: Secondary | ICD-10-CM

## 2017-08-07 DIAGNOSIS — H43813 Vitreous degeneration, bilateral: Secondary | ICD-10-CM

## 2017-08-07 DIAGNOSIS — H2513 Age-related nuclear cataract, bilateral: Secondary | ICD-10-CM

## 2017-08-07 DIAGNOSIS — E113511 Type 2 diabetes mellitus with proliferative diabetic retinopathy with macular edema, right eye: Secondary | ICD-10-CM | POA: Diagnosis not present

## 2017-09-04 ENCOUNTER — Encounter (INDEPENDENT_AMBULATORY_CARE_PROVIDER_SITE_OTHER): Payer: Medicare Other | Admitting: Ophthalmology

## 2017-09-04 DIAGNOSIS — E11311 Type 2 diabetes mellitus with unspecified diabetic retinopathy with macular edema: Secondary | ICD-10-CM

## 2017-09-04 DIAGNOSIS — H2513 Age-related nuclear cataract, bilateral: Secondary | ICD-10-CM | POA: Diagnosis not present

## 2017-09-04 DIAGNOSIS — H43813 Vitreous degeneration, bilateral: Secondary | ICD-10-CM | POA: Diagnosis not present

## 2017-09-04 DIAGNOSIS — E113392 Type 2 diabetes mellitus with moderate nonproliferative diabetic retinopathy without macular edema, left eye: Secondary | ICD-10-CM

## 2017-09-04 DIAGNOSIS — E113511 Type 2 diabetes mellitus with proliferative diabetic retinopathy with macular edema, right eye: Secondary | ICD-10-CM | POA: Diagnosis not present

## 2017-10-02 ENCOUNTER — Encounter (INDEPENDENT_AMBULATORY_CARE_PROVIDER_SITE_OTHER): Payer: Medicare Other | Admitting: Ophthalmology

## 2017-10-02 DIAGNOSIS — H2513 Age-related nuclear cataract, bilateral: Secondary | ICD-10-CM

## 2017-10-02 DIAGNOSIS — E11311 Type 2 diabetes mellitus with unspecified diabetic retinopathy with macular edema: Secondary | ICD-10-CM

## 2017-10-02 DIAGNOSIS — E113511 Type 2 diabetes mellitus with proliferative diabetic retinopathy with macular edema, right eye: Secondary | ICD-10-CM | POA: Diagnosis not present

## 2017-10-02 DIAGNOSIS — E113392 Type 2 diabetes mellitus with moderate nonproliferative diabetic retinopathy without macular edema, left eye: Secondary | ICD-10-CM

## 2017-10-02 DIAGNOSIS — H43813 Vitreous degeneration, bilateral: Secondary | ICD-10-CM

## 2017-10-30 ENCOUNTER — Encounter (INDEPENDENT_AMBULATORY_CARE_PROVIDER_SITE_OTHER): Payer: Medicare Other | Admitting: Ophthalmology

## 2017-10-30 DIAGNOSIS — H43813 Vitreous degeneration, bilateral: Secondary | ICD-10-CM | POA: Diagnosis not present

## 2017-10-30 DIAGNOSIS — E11311 Type 2 diabetes mellitus with unspecified diabetic retinopathy with macular edema: Secondary | ICD-10-CM

## 2017-10-30 DIAGNOSIS — E113392 Type 2 diabetes mellitus with moderate nonproliferative diabetic retinopathy without macular edema, left eye: Secondary | ICD-10-CM

## 2017-10-30 DIAGNOSIS — E113511 Type 2 diabetes mellitus with proliferative diabetic retinopathy with macular edema, right eye: Secondary | ICD-10-CM

## 2017-11-27 ENCOUNTER — Encounter (INDEPENDENT_AMBULATORY_CARE_PROVIDER_SITE_OTHER): Payer: Medicare Other | Admitting: Ophthalmology

## 2017-11-27 DIAGNOSIS — E11311 Type 2 diabetes mellitus with unspecified diabetic retinopathy with macular edema: Secondary | ICD-10-CM

## 2017-11-27 DIAGNOSIS — E113392 Type 2 diabetes mellitus with moderate nonproliferative diabetic retinopathy without macular edema, left eye: Secondary | ICD-10-CM | POA: Diagnosis not present

## 2017-11-27 DIAGNOSIS — E113511 Type 2 diabetes mellitus with proliferative diabetic retinopathy with macular edema, right eye: Secondary | ICD-10-CM | POA: Diagnosis not present

## 2017-11-27 DIAGNOSIS — H43813 Vitreous degeneration, bilateral: Secondary | ICD-10-CM | POA: Diagnosis not present

## 2017-11-27 DIAGNOSIS — H2513 Age-related nuclear cataract, bilateral: Secondary | ICD-10-CM

## 2018-01-01 ENCOUNTER — Encounter (INDEPENDENT_AMBULATORY_CARE_PROVIDER_SITE_OTHER): Payer: Medicare Other | Admitting: Ophthalmology

## 2018-01-01 DIAGNOSIS — E113511 Type 2 diabetes mellitus with proliferative diabetic retinopathy with macular edema, right eye: Secondary | ICD-10-CM

## 2018-01-01 DIAGNOSIS — E113392 Type 2 diabetes mellitus with moderate nonproliferative diabetic retinopathy without macular edema, left eye: Secondary | ICD-10-CM | POA: Diagnosis not present

## 2018-01-01 DIAGNOSIS — E11311 Type 2 diabetes mellitus with unspecified diabetic retinopathy with macular edema: Secondary | ICD-10-CM

## 2018-01-01 DIAGNOSIS — H43813 Vitreous degeneration, bilateral: Secondary | ICD-10-CM

## 2018-02-05 ENCOUNTER — Encounter (INDEPENDENT_AMBULATORY_CARE_PROVIDER_SITE_OTHER): Payer: Medicare Other | Admitting: Ophthalmology

## 2018-02-05 DIAGNOSIS — E113392 Type 2 diabetes mellitus with moderate nonproliferative diabetic retinopathy without macular edema, left eye: Secondary | ICD-10-CM | POA: Diagnosis not present

## 2018-02-05 DIAGNOSIS — E113511 Type 2 diabetes mellitus with proliferative diabetic retinopathy with macular edema, right eye: Secondary | ICD-10-CM | POA: Diagnosis not present

## 2018-02-05 DIAGNOSIS — E11311 Type 2 diabetes mellitus with unspecified diabetic retinopathy with macular edema: Secondary | ICD-10-CM | POA: Diagnosis not present

## 2018-02-05 DIAGNOSIS — H2513 Age-related nuclear cataract, bilateral: Secondary | ICD-10-CM

## 2018-02-05 DIAGNOSIS — H43813 Vitreous degeneration, bilateral: Secondary | ICD-10-CM

## 2018-03-12 ENCOUNTER — Encounter (INDEPENDENT_AMBULATORY_CARE_PROVIDER_SITE_OTHER): Payer: Medicare Other | Admitting: Ophthalmology

## 2018-03-12 DIAGNOSIS — E11311 Type 2 diabetes mellitus with unspecified diabetic retinopathy with macular edema: Secondary | ICD-10-CM | POA: Diagnosis not present

## 2018-03-12 DIAGNOSIS — E113511 Type 2 diabetes mellitus with proliferative diabetic retinopathy with macular edema, right eye: Secondary | ICD-10-CM

## 2018-03-12 DIAGNOSIS — H43813 Vitreous degeneration, bilateral: Secondary | ICD-10-CM | POA: Diagnosis not present

## 2018-03-12 DIAGNOSIS — E113392 Type 2 diabetes mellitus with moderate nonproliferative diabetic retinopathy without macular edema, left eye: Secondary | ICD-10-CM | POA: Diagnosis not present

## 2018-03-12 DIAGNOSIS — H2513 Age-related nuclear cataract, bilateral: Secondary | ICD-10-CM

## 2018-04-16 ENCOUNTER — Encounter (INDEPENDENT_AMBULATORY_CARE_PROVIDER_SITE_OTHER): Payer: Medicare Other | Admitting: Ophthalmology

## 2018-04-16 DIAGNOSIS — E113511 Type 2 diabetes mellitus with proliferative diabetic retinopathy with macular edema, right eye: Secondary | ICD-10-CM | POA: Diagnosis not present

## 2018-04-16 DIAGNOSIS — H43813 Vitreous degeneration, bilateral: Secondary | ICD-10-CM | POA: Diagnosis not present

## 2018-04-16 DIAGNOSIS — E11311 Type 2 diabetes mellitus with unspecified diabetic retinopathy with macular edema: Secondary | ICD-10-CM | POA: Diagnosis not present

## 2018-04-16 DIAGNOSIS — E113392 Type 2 diabetes mellitus with moderate nonproliferative diabetic retinopathy without macular edema, left eye: Secondary | ICD-10-CM | POA: Diagnosis not present

## 2018-04-16 DIAGNOSIS — H2513 Age-related nuclear cataract, bilateral: Secondary | ICD-10-CM

## 2018-05-21 ENCOUNTER — Encounter (INDEPENDENT_AMBULATORY_CARE_PROVIDER_SITE_OTHER): Payer: Medicare Other | Admitting: Ophthalmology

## 2018-05-21 DIAGNOSIS — E113511 Type 2 diabetes mellitus with proliferative diabetic retinopathy with macular edema, right eye: Secondary | ICD-10-CM | POA: Diagnosis not present

## 2018-05-21 DIAGNOSIS — H43813 Vitreous degeneration, bilateral: Secondary | ICD-10-CM | POA: Diagnosis not present

## 2018-05-21 DIAGNOSIS — E113392 Type 2 diabetes mellitus with moderate nonproliferative diabetic retinopathy without macular edema, left eye: Secondary | ICD-10-CM

## 2018-05-21 DIAGNOSIS — E11311 Type 2 diabetes mellitus with unspecified diabetic retinopathy with macular edema: Secondary | ICD-10-CM | POA: Diagnosis not present

## 2018-05-21 DIAGNOSIS — H2513 Age-related nuclear cataract, bilateral: Secondary | ICD-10-CM

## 2018-06-25 ENCOUNTER — Encounter (INDEPENDENT_AMBULATORY_CARE_PROVIDER_SITE_OTHER): Payer: Medicare Other | Admitting: Ophthalmology

## 2018-06-25 DIAGNOSIS — E113392 Type 2 diabetes mellitus with moderate nonproliferative diabetic retinopathy without macular edema, left eye: Secondary | ICD-10-CM | POA: Diagnosis not present

## 2018-06-25 DIAGNOSIS — E113511 Type 2 diabetes mellitus with proliferative diabetic retinopathy with macular edema, right eye: Secondary | ICD-10-CM

## 2018-06-25 DIAGNOSIS — E11311 Type 2 diabetes mellitus with unspecified diabetic retinopathy with macular edema: Secondary | ICD-10-CM | POA: Diagnosis not present

## 2018-06-25 DIAGNOSIS — H43813 Vitreous degeneration, bilateral: Secondary | ICD-10-CM | POA: Diagnosis not present

## 2018-07-23 ENCOUNTER — Encounter (INDEPENDENT_AMBULATORY_CARE_PROVIDER_SITE_OTHER): Payer: Medicare Other | Admitting: Ophthalmology

## 2018-07-23 DIAGNOSIS — H2513 Age-related nuclear cataract, bilateral: Secondary | ICD-10-CM

## 2018-07-23 DIAGNOSIS — E113312 Type 2 diabetes mellitus with moderate nonproliferative diabetic retinopathy with macular edema, left eye: Secondary | ICD-10-CM | POA: Diagnosis not present

## 2018-07-23 DIAGNOSIS — E11311 Type 2 diabetes mellitus with unspecified diabetic retinopathy with macular edema: Secondary | ICD-10-CM | POA: Diagnosis not present

## 2018-07-23 DIAGNOSIS — H43813 Vitreous degeneration, bilateral: Secondary | ICD-10-CM

## 2018-07-23 DIAGNOSIS — E113511 Type 2 diabetes mellitus with proliferative diabetic retinopathy with macular edema, right eye: Secondary | ICD-10-CM | POA: Diagnosis not present

## 2018-08-29 ENCOUNTER — Encounter (INDEPENDENT_AMBULATORY_CARE_PROVIDER_SITE_OTHER): Payer: Medicare Other | Admitting: Ophthalmology

## 2018-09-10 ENCOUNTER — Encounter (INDEPENDENT_AMBULATORY_CARE_PROVIDER_SITE_OTHER): Payer: Medicare Other | Admitting: Ophthalmology

## 2018-10-01 ENCOUNTER — Encounter (INDEPENDENT_AMBULATORY_CARE_PROVIDER_SITE_OTHER): Payer: Medicare Other | Admitting: Ophthalmology

## 2019-11-28 ENCOUNTER — Emergency Department (HOSPITAL_COMMUNITY): Payer: Medicare Other

## 2019-11-28 ENCOUNTER — Encounter (HOSPITAL_COMMUNITY): Payer: Self-pay | Admitting: Emergency Medicine

## 2019-11-28 ENCOUNTER — Inpatient Hospital Stay (HOSPITAL_COMMUNITY): Payer: Medicare Other

## 2019-11-28 ENCOUNTER — Other Ambulatory Visit: Payer: Self-pay

## 2019-11-28 ENCOUNTER — Inpatient Hospital Stay (HOSPITAL_COMMUNITY)
Admission: EM | Admit: 2019-11-28 | Discharge: 2019-12-17 | DRG: 628 | Disposition: A | Discharge status: Skilled Nursing Facility | Payer: Medicare Other | Attending: Internal Medicine | Admitting: Internal Medicine

## 2019-11-28 DIAGNOSIS — R0602 Shortness of breath: Secondary | ICD-10-CM | POA: Diagnosis not present

## 2019-11-28 DIAGNOSIS — L03115 Cellulitis of right lower limb: Secondary | ICD-10-CM | POA: Diagnosis present

## 2019-11-28 DIAGNOSIS — E872 Acidosis: Secondary | ICD-10-CM | POA: Diagnosis present

## 2019-11-28 DIAGNOSIS — Z79899 Other long term (current) drug therapy: Secondary | ICD-10-CM

## 2019-11-28 DIAGNOSIS — Z01810 Encounter for preprocedural cardiovascular examination: Secondary | ICD-10-CM | POA: Diagnosis not present

## 2019-11-28 DIAGNOSIS — Z89411 Acquired absence of right great toe: Secondary | ICD-10-CM

## 2019-11-28 DIAGNOSIS — I5043 Acute on chronic combined systolic (congestive) and diastolic (congestive) heart failure: Secondary | ICD-10-CM | POA: Diagnosis present

## 2019-11-28 DIAGNOSIS — E11628 Type 2 diabetes mellitus with other skin complications: Principal | ICD-10-CM | POA: Diagnosis present

## 2019-11-28 DIAGNOSIS — N19 Unspecified kidney failure: Secondary | ICD-10-CM | POA: Insufficient documentation

## 2019-11-28 DIAGNOSIS — E1122 Type 2 diabetes mellitus with diabetic chronic kidney disease: Secondary | ICD-10-CM | POA: Diagnosis present

## 2019-11-28 DIAGNOSIS — N2581 Secondary hyperparathyroidism of renal origin: Secondary | ICD-10-CM | POA: Diagnosis present

## 2019-11-28 DIAGNOSIS — E8809 Other disorders of plasma-protein metabolism, not elsewhere classified: Secondary | ICD-10-CM | POA: Diagnosis not present

## 2019-11-28 DIAGNOSIS — N185 Chronic kidney disease, stage 5: Secondary | ICD-10-CM | POA: Diagnosis present

## 2019-11-28 DIAGNOSIS — Z794 Long term (current) use of insulin: Secondary | ICD-10-CM

## 2019-11-28 DIAGNOSIS — E1151 Type 2 diabetes mellitus with diabetic peripheral angiopathy without gangrene: Secondary | ICD-10-CM | POA: Diagnosis present

## 2019-11-28 DIAGNOSIS — E1140 Type 2 diabetes mellitus with diabetic neuropathy, unspecified: Secondary | ICD-10-CM | POA: Diagnosis present

## 2019-11-28 DIAGNOSIS — L97519 Non-pressure chronic ulcer of other part of right foot with unspecified severity: Secondary | ICD-10-CM | POA: Diagnosis present

## 2019-11-28 DIAGNOSIS — E11622 Type 2 diabetes mellitus with other skin ulcer: Secondary | ICD-10-CM | POA: Diagnosis present

## 2019-11-28 DIAGNOSIS — Z20822 Contact with and (suspected) exposure to covid-19: Secondary | ICD-10-CM | POA: Diagnosis present

## 2019-11-28 DIAGNOSIS — I5021 Acute systolic (congestive) heart failure: Secondary | ICD-10-CM | POA: Diagnosis not present

## 2019-11-28 DIAGNOSIS — E43 Unspecified severe protein-calorie malnutrition: Secondary | ICD-10-CM | POA: Diagnosis not present

## 2019-11-28 DIAGNOSIS — E46 Unspecified protein-calorie malnutrition: Secondary | ICD-10-CM | POA: Diagnosis present

## 2019-11-28 DIAGNOSIS — N186 End stage renal disease: Secondary | ICD-10-CM | POA: Diagnosis present

## 2019-11-28 DIAGNOSIS — D631 Anemia in chronic kidney disease: Secondary | ICD-10-CM | POA: Diagnosis present

## 2019-11-28 DIAGNOSIS — E119 Type 2 diabetes mellitus without complications: Secondary | ICD-10-CM | POA: Diagnosis not present

## 2019-11-28 DIAGNOSIS — I872 Venous insufficiency (chronic) (peripheral): Secondary | ICD-10-CM | POA: Diagnosis present

## 2019-11-28 DIAGNOSIS — L089 Local infection of the skin and subcutaneous tissue, unspecified: Secondary | ICD-10-CM | POA: Diagnosis not present

## 2019-11-28 DIAGNOSIS — L8990 Pressure ulcer of unspecified site, unspecified stage: Secondary | ICD-10-CM | POA: Clinically undetermined

## 2019-11-28 DIAGNOSIS — L89152 Pressure ulcer of sacral region, stage 2: Secondary | ICD-10-CM | POA: Diagnosis present

## 2019-11-28 DIAGNOSIS — Z89612 Acquired absence of left leg above knee: Secondary | ICD-10-CM

## 2019-11-28 DIAGNOSIS — Z833 Family history of diabetes mellitus: Secondary | ICD-10-CM

## 2019-11-28 DIAGNOSIS — I420 Dilated cardiomyopathy: Secondary | ICD-10-CM | POA: Diagnosis not present

## 2019-11-28 DIAGNOSIS — L309 Dermatitis, unspecified: Secondary | ICD-10-CM | POA: Diagnosis present

## 2019-11-28 DIAGNOSIS — I2510 Atherosclerotic heart disease of native coronary artery without angina pectoris: Secondary | ICD-10-CM | POA: Diagnosis present

## 2019-11-28 DIAGNOSIS — I43 Cardiomyopathy in diseases classified elsewhere: Secondary | ICD-10-CM | POA: Diagnosis present

## 2019-11-28 DIAGNOSIS — Z8249 Family history of ischemic heart disease and other diseases of the circulatory system: Secondary | ICD-10-CM

## 2019-11-28 DIAGNOSIS — D649 Anemia, unspecified: Secondary | ICD-10-CM | POA: Diagnosis present

## 2019-11-28 DIAGNOSIS — Z992 Dependence on renal dialysis: Secondary | ICD-10-CM

## 2019-11-28 DIAGNOSIS — E1142 Type 2 diabetes mellitus with diabetic polyneuropathy: Secondary | ICD-10-CM | POA: Diagnosis not present

## 2019-11-28 DIAGNOSIS — R339 Retention of urine, unspecified: Secondary | ICD-10-CM | POA: Diagnosis not present

## 2019-11-28 DIAGNOSIS — R197 Diarrhea, unspecified: Secondary | ICD-10-CM | POA: Diagnosis present

## 2019-11-28 DIAGNOSIS — J189 Pneumonia, unspecified organism: Secondary | ICD-10-CM | POA: Diagnosis present

## 2019-11-28 DIAGNOSIS — N179 Acute kidney failure, unspecified: Secondary | ICD-10-CM

## 2019-11-28 DIAGNOSIS — N189 Chronic kidney disease, unspecified: Secondary | ICD-10-CM

## 2019-11-28 DIAGNOSIS — I132 Hypertensive heart and chronic kidney disease with heart failure and with stage 5 chronic kidney disease, or end stage renal disease: Secondary | ICD-10-CM | POA: Diagnosis present

## 2019-11-28 DIAGNOSIS — Z9181 History of falling: Secondary | ICD-10-CM

## 2019-11-28 DIAGNOSIS — Z89431 Acquired absence of right foot: Secondary | ICD-10-CM

## 2019-11-28 DIAGNOSIS — Z9114 Patient's other noncompliance with medication regimen: Secondary | ICD-10-CM

## 2019-11-28 DIAGNOSIS — Z7982 Long term (current) use of aspirin: Secondary | ICD-10-CM

## 2019-11-28 DIAGNOSIS — R0902 Hypoxemia: Secondary | ICD-10-CM | POA: Diagnosis present

## 2019-11-28 DIAGNOSIS — Z95828 Presence of other vascular implants and grafts: Secondary | ICD-10-CM | POA: Diagnosis not present

## 2019-11-28 DIAGNOSIS — I10 Essential (primary) hypertension: Secondary | ICD-10-CM | POA: Diagnosis not present

## 2019-11-28 DIAGNOSIS — R8281 Pyuria: Secondary | ICD-10-CM | POA: Diagnosis not present

## 2019-11-28 DIAGNOSIS — I340 Nonrheumatic mitral (valve) insufficiency: Secondary | ICD-10-CM | POA: Diagnosis not present

## 2019-11-28 DIAGNOSIS — I255 Ischemic cardiomyopathy: Secondary | ICD-10-CM | POA: Diagnosis present

## 2019-11-28 DIAGNOSIS — E875 Hyperkalemia: Secondary | ICD-10-CM | POA: Diagnosis present

## 2019-11-28 DIAGNOSIS — I70202 Unspecified atherosclerosis of native arteries of extremities, left leg: Secondary | ICD-10-CM | POA: Diagnosis present

## 2019-11-28 DIAGNOSIS — L0390 Cellulitis, unspecified: Secondary | ICD-10-CM | POA: Diagnosis not present

## 2019-11-28 DIAGNOSIS — R52 Pain, unspecified: Secondary | ICD-10-CM

## 2019-11-28 DIAGNOSIS — Z419 Encounter for procedure for purposes other than remedying health state, unspecified: Secondary | ICD-10-CM

## 2019-11-28 DIAGNOSIS — D696 Thrombocytopenia, unspecified: Secondary | ICD-10-CM | POA: Diagnosis present

## 2019-11-28 DIAGNOSIS — I361 Nonrheumatic tricuspid (valve) insufficiency: Secondary | ICD-10-CM | POA: Diagnosis not present

## 2019-11-28 DIAGNOSIS — J9601 Acute respiratory failure with hypoxia: Secondary | ICD-10-CM | POA: Diagnosis not present

## 2019-11-28 DIAGNOSIS — Z8701 Personal history of pneumonia (recurrent): Secondary | ICD-10-CM

## 2019-11-28 DIAGNOSIS — I739 Peripheral vascular disease, unspecified: Secondary | ICD-10-CM | POA: Diagnosis present

## 2019-11-28 DIAGNOSIS — I429 Cardiomyopathy, unspecified: Secondary | ICD-10-CM | POA: Diagnosis not present

## 2019-11-28 LAB — COMPREHENSIVE METABOLIC PANEL
ALT: 14 U/L (ref 0–44)
AST: 15 U/L (ref 15–41)
Albumin: 2.8 g/dL — ABNORMAL LOW (ref 3.5–5.0)
Alkaline Phosphatase: 100 U/L (ref 38–126)
Anion gap: 14 (ref 5–15)
BUN: 78 mg/dL — ABNORMAL HIGH (ref 8–23)
CO2: 17 mmol/L — ABNORMAL LOW (ref 22–32)
Calcium: 8.7 mg/dL — ABNORMAL LOW (ref 8.9–10.3)
Chloride: 112 mmol/L — ABNORMAL HIGH (ref 98–111)
Creatinine, Ser: 5.04 mg/dL — ABNORMAL HIGH (ref 0.61–1.24)
GFR calc Af Amer: 13 mL/min — ABNORMAL LOW (ref >60)
GFR calc non Af Amer: 11 mL/min — ABNORMAL LOW (ref >60)
Glucose, Bld: 119 mg/dL — ABNORMAL HIGH (ref 70–99)
Potassium: 5.4 mmol/L — ABNORMAL HIGH (ref 3.5–5.1)
Sodium: 143 mmol/L (ref 135–145)
Total Bilirubin: 1 mg/dL (ref 0.3–1.2)
Total Protein: 6.9 g/dL (ref 6.5–8.1)

## 2019-11-28 LAB — CBC WITH DIFFERENTIAL/PLATELET
Abs Immature Granulocytes: 0.05 10*3/uL (ref 0.00–0.07)
Basophils Absolute: 0 10*3/uL (ref 0.0–0.1)
Basophils Relative: 0 %
Eosinophils Absolute: 0.5 10*3/uL (ref 0.0–0.5)
Eosinophils Relative: 6 %
HCT: 29 % — ABNORMAL LOW (ref 39.0–52.0)
Hemoglobin: 8.5 g/dL — ABNORMAL LOW (ref 13.0–17.0)
Immature Granulocytes: 1 %
Lymphocytes Relative: 8 %
Lymphs Abs: 0.7 10*3/uL (ref 0.7–4.0)
MCH: 29.6 pg (ref 26.0–34.0)
MCHC: 29.3 g/dL — ABNORMAL LOW (ref 30.0–36.0)
MCV: 101 fL — ABNORMAL HIGH (ref 80.0–100.0)
Monocytes Absolute: 0.5 10*3/uL (ref 0.1–1.0)
Monocytes Relative: 5 %
Neutro Abs: 7.1 10*3/uL (ref 1.7–7.7)
Neutrophils Relative %: 80 %
Platelets: 214 10*3/uL (ref 150–400)
RBC: 2.87 MIL/uL — ABNORMAL LOW (ref 4.22–5.81)
RDW: 16.7 % — ABNORMAL HIGH (ref 11.5–15.5)
WBC: 8.9 10*3/uL (ref 4.0–10.5)
nRBC: 0 % (ref 0.0–0.2)

## 2019-11-28 LAB — CBG MONITORING, ED
Glucose-Capillary: 110 mg/dL — ABNORMAL HIGH (ref 70–99)
Glucose-Capillary: 143 mg/dL — ABNORMAL HIGH (ref 70–99)

## 2019-11-28 LAB — I-STAT CHEM 8, ED
BUN: 82 mg/dL — ABNORMAL HIGH (ref 8–23)
Calcium, Ion: 1.11 mmol/L — ABNORMAL LOW (ref 1.15–1.40)
Chloride: 114 mmol/L — ABNORMAL HIGH (ref 98–111)
Creatinine, Ser: 5.1 mg/dL — ABNORMAL HIGH (ref 0.61–1.24)
Glucose, Bld: 90 mg/dL (ref 70–99)
HCT: 26 % — ABNORMAL LOW (ref 39.0–52.0)
Hemoglobin: 8.8 g/dL — ABNORMAL LOW (ref 13.0–17.0)
Potassium: 5 mmol/L (ref 3.5–5.1)
Sodium: 144 mmol/L (ref 135–145)
TCO2: 17 mmol/L — ABNORMAL LOW (ref 22–32)

## 2019-11-28 LAB — RESPIRATORY PANEL BY RT PCR (FLU A&B, COVID)
Influenza A by PCR: NEGATIVE
Influenza B by PCR: NEGATIVE
SARS Coronavirus 2 by RT PCR: NEGATIVE

## 2019-11-28 LAB — LACTIC ACID, PLASMA
Lactic Acid, Venous: 1.3 mmol/L (ref 0.5–1.9)
Lactic Acid, Venous: 1.4 mmol/L (ref 0.5–1.9)

## 2019-11-28 LAB — POC SARS CORONAVIRUS 2 AG -  ED: SARS Coronavirus 2 Ag: NEGATIVE

## 2019-11-28 MED ORDER — METRONIDAZOLE IN NACL 5-0.79 MG/ML-% IV SOLN
500.0000 mg | Freq: Three times a day (TID) | INTRAVENOUS | Status: DC
  Administered 2019-11-28 – 2019-12-01 (×8): 500 mg via INTRAVENOUS
  Filled 2019-11-28 (×8): qty 100

## 2019-11-28 MED ORDER — SODIUM CHLORIDE 0.9 % IV BOLUS
1000.0000 mL | Freq: Once | INTRAVENOUS | Status: AC
  Administered 2019-11-28: 1000 mL via INTRAVENOUS

## 2019-11-28 MED ORDER — VANCOMYCIN VARIABLE DOSE PER UNSTABLE RENAL FUNCTION (PHARMACIST DOSING): Status: DC

## 2019-11-28 MED ORDER — VANCOMYCIN HCL IN DEXTROSE 1-5 GM/200ML-% IV SOLN
1000.0000 mg | Freq: Once | INTRAVENOUS | Status: AC
  Administered 2019-11-28: 1000 mg via INTRAVENOUS
  Filled 2019-11-28: qty 200

## 2019-11-28 MED ORDER — VANCOMYCIN HCL 500 MG/100ML IV SOLN
500.0000 mg | Freq: Once | INTRAVENOUS | Status: AC
  Administered 2019-11-28: 500 mg via INTRAVENOUS
  Filled 2019-11-28: qty 100

## 2019-11-28 MED ORDER — HEPARIN SODIUM (PORCINE) 5000 UNIT/ML IJ SOLN
5000.0000 [IU] | Freq: Three times a day (TID) | INTRAMUSCULAR | Status: DC
  Administered 2019-11-28 – 2019-12-13 (×42): 5000 [IU] via SUBCUTANEOUS
  Filled 2019-11-28 (×43): qty 1

## 2019-11-28 MED ORDER — SODIUM CHLORIDE 0.9% FLUSH: 3.0000 mL | Freq: Once | INTRAVENOUS | Status: DC

## 2019-11-28 MED ORDER — LACTATED RINGERS IV SOLN: INTRAVENOUS | Status: DC

## 2019-11-28 MED ORDER — INSULIN ASPART 100 UNIT/ML ~~LOC~~ SOLN
0.0000 [IU] | Freq: Three times a day (TID) | SUBCUTANEOUS | Status: DC
  Administered 2019-11-29 – 2019-11-30 (×4): 1 [IU] via SUBCUTANEOUS
  Administered 2019-12-01 – 2019-12-02 (×2): 2 [IU] via SUBCUTANEOUS
  Administered 2019-12-03 (×2): 1 [IU] via SUBCUTANEOUS
  Administered 2019-12-03: 2 [IU] via SUBCUTANEOUS
  Administered 2019-12-04 – 2019-12-06 (×6): 1 [IU] via SUBCUTANEOUS
  Administered 2019-12-07 – 2019-12-08 (×2): 2 [IU] via SUBCUTANEOUS
  Administered 2019-12-09 – 2019-12-10 (×3): 1 [IU] via SUBCUTANEOUS
  Administered 2019-12-12: 2 [IU] via SUBCUTANEOUS
  Administered 2019-12-13 – 2019-12-16 (×4): 1 [IU] via SUBCUTANEOUS

## 2019-11-28 MED ORDER — LABETALOL HCL 5 MG/ML IV SOLN: 5.0000 mg | INTRAVENOUS | Status: DC | PRN

## 2019-11-28 MED ORDER — PIPERACILLIN-TAZOBACTAM 3.375 G IVPB 30 MIN
3.3750 g | Freq: Once | INTRAVENOUS | Status: AC
  Administered 2019-11-28: 3.375 g via INTRAVENOUS
  Filled 2019-11-28: qty 50

## 2019-11-28 MED ORDER — INSULIN ASPART 100 UNIT/ML ~~LOC~~ SOLN: 0.0000 [IU] | Freq: Every day | SUBCUTANEOUS | Status: DC

## 2019-11-28 MED ORDER — SODIUM CHLORIDE 0.9 % IV SOLN
1.0000 g | INTRAVENOUS | Status: DC
  Administered 2019-11-29 – 2019-11-30 (×2): 1 g via INTRAVENOUS
  Filled 2019-11-28 (×2): qty 10

## 2019-11-28 NOTE — H&P (Signed)
Date: 11/28/2019               Patient Name:  [name] MRN: [id number]  DOB: [birth year] Age / Sex: 64 y.o., male   PCP: Patient, No Pcp Per         Medical Service: Internal Medicine Teaching Service         Attending Physician: Dr. Sid Falcon, MD    First Contact: Dr. Benjamine Mola Pager: [phone]  Second Contact: Dr. Truman Hayward Pager: [phone]       After Hours (After 5p/  First Contact Pager: [phone]  weekends / holidays): Second Contact Pager: [phone]   Chief Complaint: leg pain  History of Present Illness:   Mr. 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  Pt reports that since his R partial foot amputation around 3 years ago, he's had problems with his foot. He comes in today because there is such significant R foot drainage that it seeps through his boots. The drainage is always clear. He denies pustular drainage. He is unsure as to when the redness started but the streaking up his calf started a few weeks ago, as did the foul odor. He says his foot has never hurt him and he still has sensation in it. He denies fevers and chills. He does endorse recent SOB which worsens with exertion. He denies orthopnea. He denies chest pain. Other pertinent negatives include sore throat, abdominal pain, N/V, dysuria and LOC. He does report some recent diarrhea and a fall when he was stepping over something on the floor. He did not hit his head and has no significant injuries from the fall. He reports that he hasn't followed up with a physician in around 3 years as the physician he was set up to see wouldn't get closer than 20 feet to him. He hasn't taken insulin in 3 years as his Medicare was canceled.  Meds:  No outpatient medications have been marked as taking for the 11/28/19 encounter [facility] Encounter).  not taking any  medications  Allergies: Allergies as of 11/28/2019 - Review Complete 11/28/2019  Allergen Reaction Noted  . Tramadol Nausea And Vomiting 09/20/2013   Past Medical History:  Diagnosis Date  . Diabetes mellitus without complication (HCC)    PATIENT JUST LEARNED HE WAS DIABETIC  . Hepatitis   . Hypertension   . Hypokalemia   . Hyponatremia   . Necrotizing fasciitis ([place])   . Pneumonia    HX OF PNA   Past Surgical History:  Procedure Laterality Date  . AMPUTATION Left 09/11/2013   Procedure: AMPUTATION BELOW KNEE;  Surgeon: Marianna Payment, MD;  Location: [place];  Service: Orthopedics;  Laterality: Left;  . AMPUTATION Right 12/18/2013   Procedure: RIGHT FOOT 1,2, TOE AMPUTATION  5th toe RAY AMPUTATION;  Surgeon: Marianna Payment, MD;  Location: [place];  Service: Orthopedics;  Laterality: Right;  . CHOLECYSTECTOMY    . I & D EXTREMITY Left 09/03/2013   Procedure: IRRIGATION AND DEBRIDEMENT EXTREMITY;  Surgeon: Marianna Payment, MD;  Location: [place];  Service: Orthopedics;  Laterality: Left;  . I & D EXTREMITY Left 09/11/2013   Procedure: LEFT FOOT IRRIGATION AND DEBRIDEMENT;  Surgeon: Marianna Payment, MD;  Location: [place];  Service: Orthopedics;  Laterality: Left;   Family History:  Family History  Problem Relation Age of  Onset  . Diabetes type II Mother   . Dementia Mother   . Heart disease Father    Social History:  Social History   Tobacco Use  . Smoking status: Never Smoker  . Smokeless tobacco: Never Used  Substance Use Topics  . Alcohol use: No  . Drug use: No   Review of Systems: A complete ROS was negative except as per HPI.   Physical Exam: Blood pressure (!) 170/99, pulse (!) 107, temperature 98.6 F (37 C), temperature source Oral, resp. rate 19, height '5\' 3"'$  (1.6 m), weight 73.4 kg, SpO2 100 %.  Physical Exam  Constitutional: He is oriented to person, place, and time. No distress.  Unkempt appearing male  HENT:  Head: Normocephalic and atraumatic.   Cardiovascular: Regular rhythm, normal heart sounds and intact distal pulses.  No murmur heard. tachy  Pulmonary/Chest: Effort normal. No respiratory distress. He has rales (basilar). He exhibits no tenderness.  Abdominal: Soft. Bowel sounds are normal. He exhibits no distension. There is no abdominal tenderness.  Musculoskeletal:     Comments: L AKA, R partial foot amputation  Neurological: He is alert and oriented to person, place, and time.  Skin: He is not diaphoretic.  R foot unclean and erythematous with streaking up to the mid calf with lichenification, plantar surface dripping clear fluid; L stump unclean and dry with lichenification; lower back with numerous excoriations and chronic skin changes but no erythema  Psychiatric: Affect normal.  Nursing note and vitals reviewed.     Labs: Results for orders placed or performed during the hospital encounter of 11/28/19 (from the past 24 hour(s))  CBG monitoring, ED     Status: Abnormal   Collection Time: 11/28/19  1:01 PM  Result Value Ref Range   Glucose-Capillary 110 (H) 70 - 99 mg/dL  Comprehensive metabolic panel     Status: Abnormal   Collection Time: 11/28/19  1:30 PM  Result Value Ref Range   Sodium 143 135 - 145 mmol/L   Potassium 5.4 (H) 3.5 - 5.1 mmol/L   Chloride 112 (H) 98 - 111 mmol/L   CO2 17 (L) 22 - 32 mmol/L   Glucose, Bld 119 (H) 70 - 99 mg/dL   BUN 78 (H) 8 - 23 mg/dL   Creatinine, Ser 5.04 (H) 0.61 - 1.24 mg/dL   Calcium 8.7 (L) 8.9 - 10.3 mg/dL   Total Protein 6.9 6.5 - 8.1 g/dL   Albumin 2.8 (L) 3.5 - 5.0 g/dL   AST 15 15 - 41 U/L   ALT 14 0 - 44 U/L   Alkaline Phosphatase 100 38 - 126 U/L   Total Bilirubin 1.0 0.3 - 1.2 mg/dL   GFR calc non Af Amer 11 (L) >60 mL/min   GFR calc Af Amer 13 (L) >60 mL/min   Anion gap 14 5 - 15  CBC with Differential     Status: Abnormal   Collection Time: 11/28/19  1:30 PM  Result Value Ref Range   WBC 8.9 4.0 - 10.5 K/uL   RBC 2.87 (L) 4.22 - 5.81 MIL/uL    Hemoglobin 8.5 (L) 13.0 - 17.0 g/dL   HCT 29.0 (L) 39.0 - 52.0 %   MCV 101.0 (H) 80.0 - 100.0 fL   MCH 29.6 26.0 - 34.0 pg   MCHC 29.3 (L) 30.0 - 36.0 g/dL   RDW 16.7 (H) 11.5 - 15.5 %   Platelets 214 150 - 400 K/uL   nRBC 0.0 0.0 - 0.2 %  Neutrophils Relative % 80 %   Neutro Abs 7.1 1.7 - 7.7 K/uL   Lymphocytes Relative 8 %   Lymphs Abs 0.7 0.7 - 4.0 K/uL   Monocytes Relative 5 %   Monocytes Absolute 0.5 0.1 - 1.0 K/uL   Eosinophils Relative 6 %   Eosinophils Absolute 0.5 0.0 - 0.5 K/uL   Basophils Relative 0 %   Basophils Absolute 0.0 0.0 - 0.1 K/uL   Immature Granulocytes 1 %   Abs Immature Granulocytes 0.05 0.00 - 0.07 K/uL  Lactic acid, plasma     Status: None   Collection Time: 11/28/19  1:44 PM  Result Value Ref Range   Lactic Acid, Venous 1.3 0.5 - 1.9 mmol/L  Lactic acid, plasma     Status: None   Collection Time: 11/28/19  5:49 PM  Result Value Ref Range   Lactic Acid, Venous 1.4 0.5 - 1.9 mmol/L  I-stat chem 8, ED (not at [facility] or [facility])     Status: Abnormal   Collection Time: 11/28/19  6:04 PM  Result Value Ref Range   Sodium 144 135 - 145 mmol/L   Potassium 5.0 3.5 - 5.1 mmol/L   Chloride 114 (H) 98 - 111 mmol/L   BUN 82 (H) 8 - 23 mg/dL   Creatinine, Ser 5.10 (H) 0.61 - 1.24 mg/dL   Glucose, Bld 90 70 - 99 mg/dL   Calcium, Ion 1.11 (L) 1.15 - 1.40 mmol/L   TCO2 17 (L) 22 - 32 mmol/L   Hemoglobin 8.8 (L) 13.0 - 17.0 g/dL   HCT 26.0 (L) 39.0 - 52.0 %  POC SARS Coronavirus 2 Ag-ED - Nasal Swab (BD Veritor Kit)     Status: None   Collection Time: 11/28/19  6:46 PM  Result Value Ref Range   SARS Coronavirus 2 Ag NEGATIVE NEGATIVE   EKG: personally reviewed my interpretation is sinus tach without acute ischemic changes.  US Renal  Result Date: 11/28/2019 CLINICAL DATA:  Renal failure. EXAM: RENAL / URINARY TRACT ULTRASOUND COMPLETE COMPARISON:  None. FINDINGS: Right Kidney: Renal measurements: 10.60 cm x 4.20 cm x 3.90 cm = volume: 90.90 mL. There is diffusely  increased echogenicity of the renal parenchyma. No mass or hydronephrosis visualized. Left Kidney: Renal measurements: 9.90 cm x 5.10 cm x 3.70 cm = volume: 96.10 mL. There is diffusely increased echogenicity of the renal parenchyma. No mass or hydronephrosis visualized. Bladder: There is mild urinary bladder wall thickening. Other: Bilateral ureteral jets are visualized. IMPRESSION: 1. Increased echogenicity of the renal parenchyma which is likely secondary to medical renal disease. Electronically Signed   By: Virgina Norfolk M.D.   On: 11/28/2019 19:30   DG Foot Complete Right  Result Date: 11/28/2019 CLINICAL DATA:  Foot infection EXAM: RIGHT FOOT COMPLETE - 3+ VIEW COMPARISON:  11/11/2013 FINDINGS: Since the prior exam, there is been interval resection of the first and second digits at the level of the MTP joints. Transmetatarsal fifth ray resection. Healed posttraumatic deformity of the distal fourth metatarsal. No evidence of cortical destruction or periostitis to suggest acute osteomyelitis. No acute fracture. No dislocation. Bidirectional calcaneal enthesophytes. Vascular calcifications. Soft tissue swelling at the distal aspect of the foot. No soft tissue gas. IMPRESSION: 1. No radiographic evidence of acute osteomyelitis. 2. Interval postsurgical changes to the right foot, as above. 3. Soft tissue swelling of the distal aspect of the foot. No soft tissue gas. Electronically Signed   By: Davina Poke D.O.   On: 11/28/2019 14:22  Assessment:  Mr. 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  Plan by Problem:  Cellulitis: -untreated diabetic w/ hx of L AKA and partial R foot amputation without medical follow up here with increased R foot drainage, erythema with streaking up to the mid calf and foul  odor -erythema chronic but streaking and odor began a few weeks ago -no WBC, normal lactic acid, afebrile -started on vanc zosyn in ED -no evidence of osteo on x-ray -ortho consulted and ordered MRI; will see in AM  Plan: -continue vanc -switch zosyn to ceftriaxone given renal failure discussed below -add metro for anaerobic coverage -trend WBC -monitor for fever -fu MRI -fu ortho recs -consult wound care -fu blood cx  AKI vs. CKD: -pt with hx of untreated DM and HTN presenting for wound infection found to have Creatinine of 5.04 up from baseline of around 0.9 5 years ago -unclear if this is acute or chronic; could very well be chronic given poor medical follow up and untreated medical diseases -Renal US consistent with medical renal disease  -also had elevated K to 5.4, low CO2 at 17 and elevated BUN to 78 -received 1 L NaCl bolus in ED without change in Creatinine, K down to 5.0  Plan: -fu UA -LR at 75 ml/hr, continuous  -follow up Creatinine in the AM  Anemia: -Hgb 8.5 on presentation, down from baseline of around 12 before -MCV >100 -denies any acute blood loss -may be nutritional vs anemia of renal disease vs multifactorial  Plan: -check B12 -fu CBC in AM  SOB: -on ROS pt reports some recent SOB with exertion and an associated cough -no orthopnea; non smoker -nephew had covid but he doesn't live in close contact with him -on exam pt has some basilar crackles, otherwise does not appear volume up -COVID negative; no acute ischemic findings on EKG -unclear if this is excess volume from renal failure vs infection vs a low output heart failure  Plan: -chest x-ray -monitor WBC -monitor for fever  HTN: -pt with hx of hypertension presenting with elevated BP; most recently 170/99 -not on any home meds  Plan: -PRN labetalol -consider starting antihypertensive once we have a better understanding of baseline and kidney function  DM: -pt with hx of diabetes on  insulin but hasn't used insulin in 3 years as he lost his Medicare coverage -glucose 119 on presentation to ED  Plan: -SSI -Hgb A1c  Active Problems:   Renal failure  Dispo: Admit patient to Inpatient with expected length of stay greater than 2 midnights.  Signed: Al Decant, MD 11/28/2019, 8:28 PM  Pager: [phone]

## 2019-11-28 NOTE — Progress Notes (Signed)
Patient ID: [name], male   DOB: [birth year], 64 y.o.   MRN: [id number] My partner Dr. Erlinda Hong actually operated on this patient in 2015.  I have informed him that the patient is being admitted graciously to the teaching service for medical optimization.  He plans on seeing the patient tomorrow morning.

## 2019-11-28 NOTE — Progress Notes (Signed)
I will see patient in the morning. I have seen clinical photos.  I have ordered MRI of the right foot.

## 2019-11-28 NOTE — ED Provider Notes (Signed)
[facility] EMERGENCY DEPARTMENT Provider Note   CSN: [id number] Arrival date & time: 11/28/19  1246     History Chief Complaint  Patient presents with  . Wound Infection    [name] is a 64 y.o. 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 Patient states that he noticed some redness and swelling to the right foot for several weeks. He states that the odor got worse and he has more drainage over the last week or so.  He denies any fevers or chills.  He is not currently on antibiotics. He states that he does not see any doctor recently.  He was noted to be hypertensive in the ED.  He was also noted to be in renal failure but denies any trouble urinating or flank pain.  The history is provided by the patient.       Past Medical History:  Diagnosis Date  . Diabetes mellitus without complication (HCC)    PATIENT JUST LEARNED HE WAS DIABETIC  . Hepatitis   . Hypertension   . Hypokalemia   . Hyponatremia   . Necrotizing fasciitis ([place])   . Pneumonia    HX OF PNA    Patient Active Problem List   Diagnosis Date Noted  . 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 Gangrene of toe ([place]) 09/16/2013  . Fever with chills 09/16/2013  . 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 DKA (diabetic ketoacidoses) ([place]) 09/04/2013  . 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 AMPUTATION Left 09/11/2013   Procedure: AMPUTATION BELOW KNEE;  Surgeon: Marianna Payment, MD;  Location: [place];  Service: Orthopedics;  Laterality: Left;  . AMPUTATION Right 12/18/2013   Procedure: RIGHT FOOT 1,2, TOE AMPUTATION  5th toe RAY AMPUTATION;  Surgeon: Marianna Payment, MD;   Location: [place];  Service: Orthopedics;  Laterality: Right;  . CHOLECYSTECTOMY    . I & D EXTREMITY Left 09/03/2013   Procedure: IRRIGATION AND DEBRIDEMENT EXTREMITY;  Surgeon: Marianna Payment, MD;  Location: [place];  Service: Orthopedics;  Laterality: Left;  . I & D EXTREMITY Left 09/11/2013   Procedure: LEFT FOOT IRRIGATION AND DEBRIDEMENT;  Surgeon: Marianna Payment, MD;  Location: [place];  Service: Orthopedics;  Laterality: Left;       Family History  Problem Relation Age of Onset  . Diabetes type II Mother   . Dementia Mother   . Heart disease Father     Social History   Tobacco Use  . Smoking status: Never Smoker  . Smokeless tobacco: Never Used  Substance Use Topics  . Alcohol use: No  . Drug use: No    Home Medications Prior to Admission medications   Medication Sig Start Date End Date Taking? Authorizing Provider  aspirin EC 81 MG tablet Take 81 mg by mouth daily.    [provider]  HYDROcodone-acetaminophen (NORCO) 5-325 MG per tablet Take 1-2 tablets by mouth every 6 (six) hours as needed. 12/21/13   Leandrew Koyanagi, MD  insulin aspart protamine- aspart (NOVOLOG MIX 70/30) (70-30) 100 UNIT/ML injection Inject 5-20 Units into the skin 3 (three) times daily. 20 units in am , 20 units at 5 pm and 5 units at 8  pm .    [provider]  Multiple Vitamin (MULTIVITAMIN WITH MINERALS) TABS tablet Take 1 tablet by mouth daily. 10/22/13   LoveIvan Anchors, PA-C    Allergies    Tramadol  Review of Systems   Review of Systems  Musculoskeletal:       R foot pain   All other systems reviewed and are negative.   Physical Exam Updated Vital Signs BP (!) 181/116   Pulse (!) 107   Temp 98.6 F (37 C) (Oral)   Resp (!) 32   SpO2 100%   Physical Exam Vitals and nursing note reviewed.  Constitutional:      Comments: Chronically ill   HENT:     Head: Normocephalic.     Nose: Nose normal.     Mouth/Throat:     Mouth: Mucous membranes are moist.  Eyes:       Extraocular Movements: Extraocular movements intact.     Pupils: Pupils are equal, round, and reactive to light.  Cardiovascular:     Rate and Rhythm: Normal rate and regular rhythm.     Pulses: Normal pulses.  Pulmonary:     Effort: Pulmonary effort is normal.  Abdominal:     General: Abdomen is flat.     Palpations: Abdomen is soft.  Musculoskeletal:     Cervical back: Normal range of motion.     Comments: Right foot partial foot amputation.  There is surrounding cellulitis and foul-smelling odor.  I do not see any obvious ulcers.  He does have palpable DP pulse.  Left above-the-knee amputation.  Skin:    General: Skin is warm.     Capillary Refill: Capillary refill takes less than 2 seconds.     Findings: Erythema present.  Neurological:     General: No focal deficit present.     Mental Status: He is alert.  Psychiatric:        Mood and Affect: Mood normal.       ED Results / Procedures / Treatments   Labs (all labs ordered are listed, but only abnormal results are displayed) Labs Reviewed  COMPREHENSIVE METABOLIC PANEL - Abnormal; Notable for the following components:      Result Value   Potassium 5.4 (*)    Chloride 112 (*)    CO2 17 (*)    Glucose, Bld 119 (*)    BUN 78 (*)    Creatinine, Ser 5.04 (*)    Calcium 8.7 (*)    Albumin 2.8 (*)    GFR calc non Af Amer 11 (*)    GFR calc Af Amer 13 (*)    All other components within normal limits  CBC WITH DIFFERENTIAL/PLATELET - Abnormal; Notable for the following components:   RBC 2.87 (*)    Hemoglobin 8.5 (*)    HCT 29.0 (*)    MCV 101.0 (*)    MCHC 29.3 (*)    RDW 16.7 (*)    All other components within normal limits  CBG MONITORING, ED - Abnormal; Notable for the following components:   Glucose-Capillary 110 (*)    All other components within normal limits  I-STAT CHEM 8, ED - Abnormal; Notable for the following components:   Chloride 114 (*)    BUN 82 (*)    Creatinine, Ser 5.10 (*)    Calcium,  Ion 1.11 (*)    TCO2 17 (*)    Hemoglobin 8.8 (*)    HCT 26.0 (*)    All other components  within normal limits  CULTURE, BLOOD (ROUTINE X 2)  CULTURE, BLOOD (ROUTINE X 2)  LACTIC ACID, PLASMA  LACTIC ACID, PLASMA  URINALYSIS, ROUTINE W REFLEX MICROSCOPIC  POC SARS CORONAVIRUS 2 AG -  ED    EKG None  Radiology DG Foot Complete Right  Result Date: 11/28/2019 CLINICAL DATA:  Foot infection EXAM: RIGHT FOOT COMPLETE - 3+ VIEW COMPARISON:  11/11/2013 FINDINGS: Since the prior exam, there is been interval resection of the first and second digits at the level of the MTP joints. Transmetatarsal fifth ray resection. Healed posttraumatic deformity of the distal fourth metatarsal. No evidence of cortical destruction or periostitis to suggest acute osteomyelitis. No acute fracture. No dislocation. Bidirectional calcaneal enthesophytes. Vascular calcifications. Soft tissue swelling at the distal aspect of the foot. No soft tissue gas. IMPRESSION: 1. No radiographic evidence of acute osteomyelitis. 2. Interval postsurgical changes to the right foot, as above. 3. Soft tissue swelling of the distal aspect of the foot. No soft tissue gas. Electronically Signed   By: Davina Poke D.O.   On: 11/28/2019 14:22    Procedures Procedures (including critical care time)  CRITICAL CARE Performed by: Wandra Arthurs   Total critical care time: 30 minutes  Critical care time was exclusive of separately billable procedures and treating other patients.  Critical care was necessary to treat or prevent imminent or life-threatening deterioration.  Critical care was time spent personally by me on the following activities: development of treatment plan with patient and/or surrogate as well as nursing, discussions with consultants, evaluation of patient's response to treatment, examination of patient, obtaining history from patient or surrogate, ordering and performing treatments and interventions, ordering and review  of laboratory studies, ordering and review of radiographic studies, pulse oximetry and re-evaluation of patient's condition.   Medications Ordered in ED Medications  sodium chloride flush (NS) 0.9 % injection 3 mL (3 mLs Intravenous Not Given 11/28/19 1735)  vancomycin (VANCOCIN) IVPB 1000 mg/200 mL premix (1,000 mg Intravenous New Bag/Given 11/28/19 1824)  sodium chloride 0.9 % bolus 1,000 mL (1,000 mLs Intravenous New Bag/Given 11/28/19 1746)  piperacillin-tazobactam (ZOSYN) IVPB 3.375 g (0 g Intravenous Stopped 11/28/19 1820)    ED Course  I have reviewed the triage vital signs and the nursing notes.  Pertinent labs & imaging results that were available during my care of the patient were reviewed by me and considered in my medical decision making (see chart for details).    MDM Rules/Calculators/A&P                      Taylin Leavy is a 64 y.o. male here presenting with right foot cellulitis.  The foot appeared foul-smelling.  Patient has previous right foot partial amputation.  Will get CBC, CMP, lactate, cultures. Will get x-ray to rule out air.  Will give broad-spectrum IV antibiotics for cellulitis.  6:29 PM Labs showed acute renal failure and that was confirmed on i-STAT Chem-8.  His creatinine is 5 and his potassium is 5.  He is also anemic.  I talked to Dr. Jeanella Anton from Nephrology.  He thinks is likely chronic renal failure from uncontrolled diabetes and hypertension.  He recommend renal ultrasound and he will see patient in the morning.  There is no air or osteomyelitis on the x-ray of the foot.  I talked to Dr. Doreatha Martin, will discuss case with [facility] to see patient in the hospital. Internal teaching service to admit.     Final Clinical Impression(s) /  ED Diagnoses Final diagnoses:  None    Rx / DC Orders ED Discharge Orders    None       Drenda Freeze, MD 11/28/19 1902

## 2019-11-28 NOTE — Progress Notes (Signed)
Pharmacy Antibiotic Note  [name] is a 64 y.o. male admitted on 11/28/2019 with severe diabetic foot infection.  Pharmacy has been consulted for Vancomycin dosing. Pt afebrile, WBC wnl. Scr elevated at 5.10 (Baseline from 2015 <1).   Plan: Vancomycin 1500mg  IV once Will order future maintenance doses pending improvement in renal function Monitor renal function, cultures/sensitivities, clinical progression Check vancomycin levels as indicated  Height: 5\' 3"  (160 cm) Weight: 73.4 kg (161 lb 13.1 oz) IBW/kg (Calculated) : 56.9  Temp (24hrs), Avg:98.6 F (37 C), Min:98.6 F (37 C), Max:98.6 F (37 C)  Recent Labs  Lab 11/28/19 1330 11/28/19 1344 11/28/19 1749 11/28/19 1804  WBC 8.9  --   --   --   CREATININE 5.04*  --   --  5.10*  LATICACIDVEN  --  1.3 1.4  --     Estimated Creatinine Clearance: 13.7 mL/min (A) (by C-G formula based on SCr of 5.1 mg/dL (H)).    Allergies  Allergen Reactions  . Tramadol Nausea And Vomiting    Pt states he took this on an empty stomach one time and it came right back up. He's not had it since.     Antimicrobials this admission: Vancomycin 4/29 >>  CTX 4/29 >>  Flagyl 4/29 >> Zosyn 4/29 x1  Dose adjustments this admission: N/A  Microbiology results: 4/29 BCx:   Richardine Service, PharmD PGY1 Pharmacy Resident Phone: [phone] 11/28/2019  8:15 PM  Please check [email or web address] for unit-specific pharmacy phone numbers.

## 2019-11-28 NOTE — ED Triage Notes (Signed)
Pt reports being diabetic but not taking his medications and that his right leg is weeping. CBG 110 in triage. Pt also states that he has parasites.

## 2019-11-29 ENCOUNTER — Other Ambulatory Visit (HOSPITAL_COMMUNITY): Payer: Medicare Other

## 2019-11-29 ENCOUNTER — Inpatient Hospital Stay (HOSPITAL_COMMUNITY): Payer: Medicare Other

## 2019-11-29 DIAGNOSIS — I872 Venous insufficiency (chronic) (peripheral): Secondary | ICD-10-CM | POA: Diagnosis not present

## 2019-11-29 DIAGNOSIS — I361 Nonrheumatic tricuspid (valve) insufficiency: Secondary | ICD-10-CM | POA: Diagnosis not present

## 2019-11-29 DIAGNOSIS — J189 Pneumonia, unspecified organism: Secondary | ICD-10-CM | POA: Diagnosis present

## 2019-11-29 DIAGNOSIS — E1142 Type 2 diabetes mellitus with diabetic polyneuropathy: Secondary | ICD-10-CM | POA: Diagnosis not present

## 2019-11-29 DIAGNOSIS — I340 Nonrheumatic mitral (valve) insufficiency: Secondary | ICD-10-CM

## 2019-11-29 DIAGNOSIS — E43 Unspecified severe protein-calorie malnutrition: Secondary | ICD-10-CM | POA: Diagnosis not present

## 2019-11-29 DIAGNOSIS — D649 Anemia, unspecified: Secondary | ICD-10-CM | POA: Diagnosis present

## 2019-11-29 DIAGNOSIS — R0602 Shortness of breath: Secondary | ICD-10-CM | POA: Diagnosis present

## 2019-11-29 DIAGNOSIS — N179 Acute kidney failure, unspecified: Secondary | ICD-10-CM

## 2019-11-29 DIAGNOSIS — L03115 Cellulitis of right lower limb: Secondary | ICD-10-CM

## 2019-11-29 DIAGNOSIS — N185 Chronic kidney disease, stage 5: Secondary | ICD-10-CM | POA: Diagnosis present

## 2019-11-29 DIAGNOSIS — N189 Chronic kidney disease, unspecified: Secondary | ICD-10-CM

## 2019-11-29 DIAGNOSIS — I10 Essential (primary) hypertension: Secondary | ICD-10-CM | POA: Diagnosis present

## 2019-11-29 LAB — COMPREHENSIVE METABOLIC PANEL
ALT: 14 U/L (ref 0–44)
AST: 16 U/L (ref 15–41)
Albumin: 2.6 g/dL — ABNORMAL LOW (ref 3.5–5.0)
Alkaline Phosphatase: 90 U/L (ref 38–126)
Anion gap: 12 (ref 5–15)
BUN: 79 mg/dL — ABNORMAL HIGH (ref 8–23)
CO2: 17 mmol/L — ABNORMAL LOW (ref 22–32)
Calcium: 8.3 mg/dL — ABNORMAL LOW (ref 8.9–10.3)
Chloride: 114 mmol/L — ABNORMAL HIGH (ref 98–111)
Creatinine, Ser: 5.19 mg/dL — ABNORMAL HIGH (ref 0.61–1.24)
GFR calc Af Amer: 13 mL/min — ABNORMAL LOW (ref >60)
GFR calc non Af Amer: 11 mL/min — ABNORMAL LOW (ref >60)
Glucose, Bld: 138 mg/dL — ABNORMAL HIGH (ref 70–99)
Potassium: 4.9 mmol/L (ref 3.5–5.1)
Sodium: 143 mmol/L (ref 135–145)
Total Bilirubin: 1 mg/dL (ref 0.3–1.2)
Total Protein: 6.6 g/dL (ref 6.5–8.1)

## 2019-11-29 LAB — URINALYSIS, ROUTINE W REFLEX MICROSCOPIC
Bilirubin Urine: NEGATIVE
Glucose, UA: 50 mg/dL — AB
Hgb urine dipstick: NEGATIVE
Ketones, ur: 5 mg/dL — AB
Nitrite: NEGATIVE
Protein, ur: >=300 mg/dL — AB
Specific Gravity, Urine: 1.014 (ref 1.005–1.030)
WBC, UA: >50 WBC/hpf — ABNORMAL HIGH (ref 0–5)
pH: 7 (ref 5.0–8.0)

## 2019-11-29 LAB — CBC
HCT: 31.9 % — ABNORMAL LOW (ref 39.0–52.0)
Hemoglobin: 9.5 g/dL — ABNORMAL LOW (ref 13.0–17.0)
MCH: 29.7 pg (ref 26.0–34.0)
MCHC: 29.8 g/dL — ABNORMAL LOW (ref 30.0–36.0)
MCV: 99.7 fL (ref 80.0–100.0)
Platelets: 188 10*3/uL (ref 150–400)
RBC: 3.2 MIL/uL — ABNORMAL LOW (ref 4.22–5.81)
RDW: 16.6 % — ABNORMAL HIGH (ref 11.5–15.5)
WBC: 6.9 10*3/uL (ref 4.0–10.5)
nRBC: 0 % (ref 0.0–0.2)

## 2019-11-29 LAB — GLUCOSE, CAPILLARY
Glucose-Capillary: 122 mg/dL — ABNORMAL HIGH (ref 70–99)
Glucose-Capillary: 118 mg/dL — ABNORMAL HIGH (ref 70–99)
Glucose-Capillary: 131 mg/dL — ABNORMAL HIGH (ref 70–99)

## 2019-11-29 LAB — PROTEIN / CREATININE RATIO, URINE
Creatinine, Urine: 68.83 mg/dL
Protein Creatinine Ratio: 5.93 mg/mg{Cre} — ABNORMAL HIGH (ref 0.00–0.15)
Total Protein, Urine: 408 mg/dL

## 2019-11-29 LAB — VITAMIN B12: Vitamin B-12: 349 pg/mL (ref 180–914)

## 2019-11-29 LAB — ECHOCARDIOGRAM COMPLETE
Height: 63 in
Weight: 2589.08 oz

## 2019-11-29 LAB — CBG MONITORING, ED: Glucose-Capillary: 146 mg/dL — ABNORMAL HIGH (ref 70–99)

## 2019-11-29 LAB — HIV ANTIBODY (ROUTINE TESTING W REFLEX): HIV Screen 4th Generation wRfx: NONREACTIVE

## 2019-11-29 LAB — HEMOGLOBIN A1C
Hgb A1c MFr Bld: 5.5 % (ref 4.8–5.6)
Mean Plasma Glucose: 111.15 mg/dL

## 2019-11-29 MED ORDER — AMLODIPINE BESYLATE 10 MG PO TABS
10.0000 mg | ORAL_TABLET | Freq: Every day | ORAL | Status: DC
  Administered 2019-11-30 – 2019-12-01 (×2): 10 mg via ORAL
  Filled 2019-11-29 (×2): qty 1

## 2019-11-29 MED ORDER — AMLODIPINE BESYLATE 5 MG PO TABS
5.0000 mg | ORAL_TABLET | Freq: Every day | ORAL | Status: DC
  Administered 2019-11-29: 5 mg via ORAL
  Filled 2019-11-29: qty 1

## 2019-11-29 MED ORDER — HYDROCERIN EX CREA
TOPICAL_CREAM | Freq: Two times a day (BID) | CUTANEOUS | Status: DC
  Administered 2019-11-30 – 2019-12-08 (×3): 1 via TOPICAL
  Filled 2019-11-29 (×2): qty 113

## 2019-11-29 MED ORDER — SODIUM BICARBONATE 650 MG PO TABS
650.0000 mg | ORAL_TABLET | Freq: Three times a day (TID) | ORAL | Status: DC
  Administered 2019-11-29 – 2019-12-13 (×42): 650 mg via ORAL
  Filled 2019-11-29 (×41): qty 1

## 2019-11-29 MED ORDER — AMLODIPINE BESYLATE 5 MG PO TABS: 10.0000 mg | ORAL_TABLET | Freq: Every day | ORAL | Status: DC

## 2019-11-29 NOTE — Progress Notes (Signed)
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  Subjective  No overnight events. Feeling well this morning. Discussed results of CT, echo, renal US and plan moving forward in regard to heart failure, renal disease management.  Significant Hospital Events   4/29 hospital admission 4/30 ortho and neph consulted.  Objective   Blood pressure (!) 160/97, pulse 97, temperature 98 F (36.7 C), temperature source Oral, resp. rate 18, height 5\' 3"  (1.6 m), weight 73.4 kg, SpO2 98 %.     Intake/Output Summary (Last 24 hours) at 11/30/2019 0531 Last data filed at 11/29/2019 2039 Gross per 24 hour  Intake 1040.73 ml  Output 800 ml  Net 240.73 ml   Filed Weights   11/28/19 2000  Weight: 73.4 kg    Examination: GENERAL: in no acute distress CARDIAC: heart RRR. No peripheral edema.  PULMONARY: breathing comfortably on nasal canula. Lung sounds diminished at the bases SKIN: no rash or lesions on limited exam   Consults:  Orthopedics nephology   Significant Diagnostic Tests:  4/29 R foot MRI>>no evidence of loculated fluid collection or osteomyelitis  4/29 CXR>>b/l effusions with basilar atelectasis vs pna; vascular congestion with diffuse interstitial opacities suspicious for pulm edema  4/30 CT renal study>>no acute abnormalities in abd/pelvis.  4/30 echocardiogram>>Left ventricular ejection fraction, by estimation, is 30 to 35%. The left ventricle has moderately decreased function. The left ventricle demonstrates global hypokinesis. Diffuse hypokinesis worse in the inferior base .  The left ventricular internal cavity size was mildly dilated. There is moderate left ventricular hypertrophy. Left ventricular diastolic parameters are  consistent with Grade II diastolic dysfunction (pseudonormalization). Elevated left ventricular end-diastolic pressure. Right ventricular systolic function is moderately reduced. The right ventricular size is mildly enlarged. The mitral valve is normal in structure. Mild mitral valve egurgitation. No evidence of mitral stenosis. The aortic valve is tricuspid. Aortic valve regurgitation is not  visualized. Mild aortic valve sclerosis is present, with no evidence of aortic valve stenosis. The inferior vena cava is dilated in size with <50% respiratory  variability, suggesting right atrial pressure of 15 mmHg.   Micro Data:  4/29 blood cultures>>NGTD  Antimicrobials:  Vancomycin 4/29>> Rocephin 4/29>> Flagyl 4/29>>  Labs    CBC Latest Ref Rng & Units 11/30/2019 11/29/2019 11/28/2019  WBC 4.0 - 10.5 K/uL 7.4 6.9 -  Hemoglobin 13.0 - 17.0 g/dL 7.5(L) 9.5(L) 8.8(L)  Hematocrit 39.0 - 52.0 % 24.6(L) 31.9(L) 26.0(L)  Platelets 150 - 400 K/uL 194 188 -   BMP Latest Ref Rng & Units 11/29/2019 11/28/2019 11/28/2019  Glucose 70 - 99 mg/dL 138(H) 90 119(H)  BUN 8 - 23 mg/dL 79(H) 82(H) 78(H)  Creatinine 0.61 - 1.24 mg/dL 5.19(H) 5.10(H) 5.04(H)  Sodium 135 - 145 mmol/L 143 144 143  Potassium 3.5 - 5.1 mmol/L 4.9 5.0 5.4(H)  Chloride 98 - 111 mmol/L 114(H) 114(H) 112(H)  CO2 22 - 32 mmol/L 17(L) - 17(L)  Calcium 8.9 - 10.3 mg/dL 8.3(L) - 8.7(L)    Summary  64 yo male with T2DM and HTN who was admitted to IMTS on 4/29 for purulent cellulitis of RLE.  Assessment & Plan:  Principal Problem:   Type 2 diabetes mellitus with diabetic foot infection ([place])  Active Problems:   Renal failure   Anemia   Uncontrolled hypertension   CKD (chronic kidney disease), stage V (HCC)   Shortness of breath  Purulent cellulitis of the RLE in the setting of venous/lymphatic insufficiency and T2DM -MRI on admission neg for abscess or osteomyelitis -Ortho consulted and recommending continued antibiotics and outpatient  follow up. No surgical management recommended at this time. -afebrile overnight. No leukocytosis. No growth on blood cultures Plan --Continue vancomycin, rocephin and flagyl. Can consider stopping vanc if MRSA is negative --follow blood cultures --Continue compression wrap  AKI vs CKD Metabolic acidosis in setting of CKD/AKI. Lactate normal. Initially unclear if AKI or CKD due to lack of recent labs however imaging supports CKD. No acute findings on CT renal study however could have a component of AKI due to acute infection. Extent of CKD will be better assessed after recovery of acute illness. Suspect CKD 2/2 T2DM and HTN. Elevated protein/creatine urine ratio. UOP somewhat poor--300cc out since 1900 last evening Nephrology on board Plan Sodium bicarb 650mg  tid Strict I/O Appreciate nephrology recommendations  Macrocytic anemia. 8.5 on admission. Again, we do not have a good baseline due to lack of recent labs however suspected to be 2/2 CKD. No sign of active bleeding. Plan: Plan: Continue to monitor. Transfuse for hgb <7  Acute hypoxic respiratory failure due to pulmonary edema and bilateral pleural effusions due to decompensated combined heart failure.  Chest imaging on admission significant pulm edema and bilateral pleural effusions. Requiring 2L supplemental O2.  Plan  Wean supplemental O2 as able. Suspect to see improvement with volume removal (see below)  Combined heart failure Moderate LVH Echocardiogram with EF 30-35% with global LV hypokinesis and grade II diastolic dysfunction. No prior echo available for comparison in chart. Volume overload likely combination of severe heart failure and renal disease. Will need to get started on heart failure regimen however renal disease prohibiting use of ACE or spironlactone Uncontrolled hypertension. Blood pressure on admission 190/109. Pressures improved but still fairly elevated overnight Plan Amlodipine 5mg  40mg  IV lasix one  time dose 25mg  metoprolol bid Continuous tele monitoring, strict I/O, daily weights, fluid restriction  T2DM. A1C 5.5 on admission however likely falsely lowered in setting of anemia and CKD. He had been using insulin until 3 years ago when he lost medicare coverage. Will continue to SSI for now and he will need outpatient establishment with PCP.  Goals of care. Would likely benefit from outpatient palliative visit given his severe and numerous comorbidities.  Best practice:  CODE STATUS: full Diet: renal, cardiac, fl restriction DVT for prophylaxis: heparin Social considerations/Family communication: TOC to assist with insurance Dispo: discharge home pending further treatment for cellulitis, decompensated heart failure, and acute respiratory failure   [name], MD [place] [facility] PGY-1 PAGER #: [phone] 11/30/19  5:31 AM

## 2019-11-29 NOTE — Progress Notes (Signed)
  Echocardiogram 2D Echocardiogram has been performed.  Bobbye Charleston 11/29/2019, 11:43 AM

## 2019-11-29 NOTE — ED Notes (Signed)
Attempted report x1. 

## 2019-11-29 NOTE — Consult Note (Signed)
ORTHOPAEDIC CONSULTATION  REQUESTING PHYSICIAN: Sid Falcon, MD  Chief Complaint: Cellulitis and dermatitis right foot with drainage.  HPI: [name] is a 64 y.o. 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  Patient presents with complications from his diabetes as well as redness drainage and pain in the right foot.  Past Medical History:  Diagnosis Date  . Diabetes mellitus without complication (HCC)    PATIENT JUST LEARNED HE WAS DIABETIC  . Hepatitis   . Hypertension   . Hypokalemia   . Hyponatremia   . Necrotizing fasciitis ([place])   . Pneumonia    HX OF PNA   Past Surgical History:  Procedure Laterality Date  . AMPUTATION Left 09/11/2013   Procedure: AMPUTATION BELOW KNEE;  Surgeon: Marianna Payment, MD;  Location: [facility];  Service: Orthopedics;  Laterality: Left;  . AMPUTATION Right 12/18/2013   Procedure: RIGHT FOOT 1,2, TOE AMPUTATION  5th toe RAY AMPUTATION;  Surgeon: Marianna Payment, MD;  Location: [facility];  Service: Orthopedics;  Laterality: Right;  . CHOLECYSTECTOMY    . I & D EXTREMITY Left 09/03/2013   Procedure: IRRIGATION AND DEBRIDEMENT EXTREMITY;  Surgeon: Marianna Payment, MD;  Location: [facility];  Service: Orthopedics;  Laterality: Left;  . I & D EXTREMITY Left 09/11/2013   Procedure: LEFT FOOT IRRIGATION AND DEBRIDEMENT;  Surgeon: Marianna Payment, MD;  Location: [facility];  Service: Orthopedics;  Laterality: Left;   Social History   Socioeconomic History  . Marital status: Single    Spouse name: Not on file  . Number of children: Not on file  . Years of education: Not on file  . Highest education level: Not on file  Occupational History  . Not on file  Tobacco Use  . Smoking status: Never Smoker  . Smokeless tobacco: Never Used  Substance and Sexual Activity  . Alcohol use: No  . Drug use: No  . Sexual activity: Not on file  Other Topics Concern    . Not on file  Social History Narrative  . Not on file   Social Determinants of Health   Financial Resource Strain:   . Difficulty of Paying Living Expenses:   Food Insecurity:   . Worried About Charity fundraiser in the Last Year:   . Arboriculturist in the Last Year:   Transportation Needs:   . Film/video editor (Medical):   Marland Kitchen Lack of Transportation (Non-Medical):   Physical Activity:   . Days of Exercise per Week:   . Minutes of Exercise per Session:   Stress:   . Feeling of Stress :   Social Connections:   . Frequency of Communication with Friends and Family:   . Frequency of Social Gatherings with Friends and Family:   . Attends Religious Services:   . Active Member of Clubs or Organizations:   . Attends Archivist Meetings:   Marland Kitchen Marital Status:    Family History  Problem Relation Age of Onset  . Diabetes type II Mother   . Dementia Mother   . Heart disease Father    - negative except otherwise stated in the family history section Allergies  Allergen Reactions  . Tramadol Nausea And Vomiting    Pt states he took this on an empty stomach one time and it came right back up. He's not had it since.    Prior to Admission medications  Medication Sig Start Date End Date Taking? Authorizing Provider  HYDROcodone-acetaminophen (NORCO) 5-325 MG per tablet Take 1-2 tablets by mouth every 6 (six) hours as needed. Patient not taking: Reported on 11/28/2019 12/21/13   Leandrew Koyanagi, MD  Multiple Vitamin (MULTIVITAMIN WITH MINERALS) TABS tablet Take 1 tablet by mouth daily. Patient not taking: Reported on 11/28/2019 10/22/13   Bary Leriche, PA-C   US Renal  Result Date: 11/28/2019 CLINICAL DATA:  Renal failure. EXAM: RENAL / URINARY TRACT ULTRASOUND COMPLETE COMPARISON:  None. FINDINGS: Right Kidney: Renal measurements: 10.60 cm x 4.20 cm x 3.90 cm = volume: 90.90 mL. There is diffusely increased echogenicity of the renal parenchyma. No mass or hydronephrosis  visualized. Left Kidney: Renal measurements: 9.90 cm x 5.10 cm x 3.70 cm = volume: 96.10 mL. There is diffusely increased echogenicity of the renal parenchyma. No mass or hydronephrosis visualized. Bladder: There is mild urinary bladder wall thickening. Other: Bilateral ureteral jets are visualized. IMPRESSION: 1. Increased echogenicity of the renal parenchyma which is likely secondary to medical renal disease. Electronically Signed   By: Virgina Norfolk M.D.   On: 11/28/2019 19:30   MR FOOT RIGHT WO CONTRAST  Result Date: 11/28/2019 CLINICAL DATA:  Lower extremity abscess EXAM: MRI OF THE RIGHT FOREFOOT WITHOUT CONTRAST TECHNIQUE: Multiplanar, multisequence MR imaging of the right was performed. No intravenous contrast was administered. COMPARISON:  None. FINDINGS: Bones/Joint/Cartilage The patient is status post transmetatarsal amputation of the fifth digit and has had prior amputation of the first and second amputations at the trans phalangeal joints. Healed fracture deformity of the fourth metatarsal shaft is seen. No areas of cortical destruction or periosteal reaction seen throughout the remainder of the osseous structures. No large joint effusions are seen. Ligaments The Lisfranc ligaments are intact. Muscles and Tendons There is extensive fatty atrophy of muscles of the forefoot. Visualized portions of the flexor and extensor tendons are intact. The plantar fascia is intact. The Achilles tendon is intact. Soft tissues There is a focal area of possible skin ulceration seen along the amputation site at the first metatarsal head. No loculated fluid collections or sinus tract are seen. There is fat pad inflammation seen. IMPRESSION: Status post amputation of the first, second, and fifth digits. Area of skin irregularity seen overlying the amputation site at the first digit. However no loculated fluid collections or evidence of osteomyelitis. Electronically Signed   By: Prudencio Pair M.D.   On: 11/28/2019  22:07   DG Chest Port 1 View  Result Date: 11/28/2019 CLINICAL DATA:  Right leg swelling EXAM: PORTABLE CHEST 1 VIEW COMPARISON:  12/18/2013 FINDINGS: Small bilateral pleural effusions. Basilar airspace disease. Vascular congestion with diffuse interstitial and hazy lung opacity. No pneumothorax. IMPRESSION: 1. Bilateral pleural effusions with basilar atelectasis or pneumonia 2. Vascular congestion with diffuse interstitial and hazy lung opacity suspect for pulmonary edema. Electronically Signed   By: Donavan Foil M.D.   On: 11/28/2019 20:36   DG Foot Complete Right  Result Date: 11/28/2019 CLINICAL DATA:  Foot infection EXAM: RIGHT FOOT COMPLETE - 3+ VIEW COMPARISON:  11/11/2013 FINDINGS: Since the prior exam, there is been interval resection of the first and second digits at the level of the MTP joints. Transmetatarsal fifth ray resection. Healed posttraumatic deformity of the distal fourth metatarsal. No evidence of cortical destruction or periostitis to suggest acute osteomyelitis. No acute fracture. No dislocation. Bidirectional calcaneal enthesophytes. Vascular calcifications. Soft tissue swelling at the distal aspect of the foot. No soft tissue gas.  IMPRESSION: 1. No radiographic evidence of acute osteomyelitis. 2. Interval postsurgical changes to the right foot, as above. 3. Soft tissue swelling of the distal aspect of the foot. No soft tissue gas. Electronically Signed   By: Davina Poke D.O.   On: 11/28/2019 14:22   - pertinent xrays, CT, MRI studies were reviewed and independently interpreted  Positive ROS: All other systems have been reviewed and were otherwise negative with the exception of those mentioned in the HPI and as above.  Physical Exam: General: Alert, no acute distress Psychiatric: Patient is competent for consent with normal mood and affect Lymphatic: No axillary or cervical lymphadenopathy Cardiovascular: No pedal edema Respiratory: No cyanosis, no use of accessory  musculature GI: No organomegaly, abdomen is soft and non-tender    Images:  @ENCIMAGES @  Labs:  Lab Results  Component Value Date   HGBA1C 5.5 11/29/2019   HGBA1C 12.2 (H) 09/02/2013   REPTSTATUS 09/21/2013 FINAL 09/15/2013   GRAMSTAIN  09/03/2013    NO WBC SEEN NO SQUAMOUS EPITHELIAL CELLS SEEN MODERATE GRAM POSITIVE COCCI IN PAIRS Performed at [facility]  09/03/2013    NO WBC SEEN NO SQUAMOUS EPITHELIAL CELLS SEEN MODERATE GRAM POSITIVE COCCI IN PAIRS Performed at [facility]  09/15/2013    NO GROWTH 5 DAYS Performed at [facility] 09/03/2013    Lab Results  Component Value Date   ALBUMIN 2.6 (L) 11/29/2019   ALBUMIN 2.8 (L) 11/28/2019   ALBUMIN 3.3 (L) 12/18/2013    Neurologic: Patient does not have protective sensation bilateral lower extremities.   MUSCULOSKELETAL:   Skin: Examination patient has dermatitis and swelling of the right lower extremity.  There is cellulitis.  Patient does not have a palpable pulse.  Examination of the left transtibial amputation shows a stable residual limb with no ulcers no cellulitis.  Review of the radiographs shows no destructive bony changes.  Review of the MRI scan shows no abscess no osteomyelitis.  Ankle-brachial indices of the right lower extremity in 2015 showed triphasic flow with ABIs close to 1.  Patient has no clinical symptoms of acute vascular compromise symptoms seem to be most consistent with venous and lymphatic insufficiency. Assessment: Diabetic insensate neuropathy with protein caloric malnutrition with dermatitis and cellulitis right foot with venous and lymphatic insufficiency.  No abscess or osteomyelitis per MRI scan.  Plan: Plan: Agree with IV antibiotics and I will write orders for a multilayer compression wrap for the right lower extremity.  I will follow-up in the office after discharge.  Weightbearing as tolerated on the  right lower extremity.  Thank you for the consult and the opportunity to see Mr. Tiburcio Bash, MD [facility] [phone] 7:32 AM

## 2019-11-29 NOTE — Consult Note (Addendum)
[facility] Nurse Consult Note: Reason for Consult:  Patient with RLE edema, erythema and warmth.  S/P amputation of right great toe and digits 2-3 in 2015 by Dr. Erlinda Hong.  Dr. Erlinda Hong has been consulted and will see later today.  An MRI was ordered of the right foot. There is dried eschar at the right knee and loose peeling skin on the pretibial area. Wound type: infectious Pressure Injury POA: N/A Measurement: Dried eschar on right knee measures 2cm x 2.5cm Wound bed:dry Drainage (amount, consistency, odor) none Periwound: Right LE is dry, flaking and with peeling epidermis.  The right foot is red, warm and with evidence of diminished edema (ridges in skin). Dressing procedure/placement/frequency: As Dr. Erlinda Hong will be in later today, I will provide only conservative care orders i.e., washing the LE with soap and water and gently patting dry, applying Eucerin cream to the LE and floating the heel with a Prevalon boot. The eschar on the right LE will be painted twice daily with a betadine swabstick and allow to air dry. I will acquiesce to Dr. Phoebe Sharps expertise for further diagnostic and interventional plans.  Addendum:  Dr. Eather Colas was just in to see patient and has reviewed the MRI noting that there is no abscess or osteomyelitis.  I will add a wrap dressing for compression to the Nursing care orders per his instructions. Patient is to follow up with Dr. Sharol Given in his office in 2 weeks.  [facility] nursing team will not follow, but will remain available to this patient, the nursing and medical teams.  Please re-consult if needed. Thanks, Maudie Flakes, MSN, RN, [place], [facility]  Pager# [phone]

## 2019-11-29 NOTE — Progress Notes (Addendum)
Subjective:  Patient seen at bedside this AM. Patient denies pain in his leg. Patient states he sleeps with 1 pillow rolled up but does wake up at night short of breath occasionally.  Objective:    Vital Signs (last 24 hours): Vitals:   11/28/19 2215 11/28/19 2251 11/29/19 0117 11/29/19 0627  BP: (!) 184/97 (!) 185/108 (!) 176/107 (!) 177/107  Pulse: (!) 104 (!) 38 (!) 104 (!) 101  Resp: '18 16 16 20  '$ Temp:  97.7 F (36.5 C) 97.9 F (36.6 C)   TempSrc:  Oral Oral   SpO2: 96% (!) 79% (!) 85% 98%  Weight:      Height:       Physical Exam: General Alert and answers questions appropriately, no acute distress, unkempt appearing  Cardiac Regular rate and rhythm, no murmurs, rubs, or gallops  Pulmonary Mild bilateral crackles, no wheezing, bilateral air movement  Extremities No peripheral edema   DG Chest Port 1 View (11/28/19): IMPRESSION: 1. Bilateral pleural effusions with basilar atelectasis or pneumonia 2. Vascular congestion with diffuse interstitial and hazy lung opacity suspect for pulmonary edema.  MR Foot Right WO Contrast (11/28/19): IMPRESSION: Status post amputation of the first, second, and fifth digits. Area of skin irregularity seen overlying the amputation site at the first digit. However no loculated fluid collections or evidence of Osteomyelitis.  US Renal (11/28/19): IMPRESSION: 1. Increased echogenicity of the renal parenchyma which is likely secondary to medical renal disease.  CBC Latest Ref Rng & Units 11/29/2019 11/28/2019 11/28/2019  WBC 4.0 - 10.5 K/uL 6.9 - 8.9  Hemoglobin 13.0 - 17.0 g/dL 9.5(L) 8.8(L) 8.5(L)  Hematocrit 39.0 - 52.0 % 31.9(L) 26.0(L) 29.0(L)  Platelets 150 - 400 K/uL 188 - 214   BMP Latest Ref Rng & Units 11/29/2019 11/28/2019 11/28/2019  Glucose 70 - 99 mg/dL 138(H) 90 119(H)  BUN 8 - 23 mg/dL 79(H) 82(H) 78(H)  Creatinine 0.61 - 1.24 mg/dL 5.19(H) 5.10(H) 5.04(H)  Sodium 135 - 145 mmol/L 143 144 143  Potassium 3.5 - 5.1 mmol/L  4.9 5.0 5.4(H)  Chloride 98 - 111 mmol/L 114(H) 114(H) 112(H)  CO2 22 - 32 mmol/L 17(L) - 17(L)  Calcium 8.9 - 10.3 mg/dL 8.3(L) - 8.7(L)   Urinalysis    Component Value Date/Time   COLORURINE YELLOW 11/29/2019 0842   APPEARANCEUR HAZY (A) 11/29/2019 0842   LABSPEC 1.014 11/29/2019 0842   PHURINE 7.0 11/29/2019 0842   GLUCOSEU 50 (A) 11/29/2019 0842   HGBUR NEGATIVE 11/29/2019 0842   BILIRUBINUR NEGATIVE 11/29/2019 0842   KETONESUR 5 (A) 11/29/2019 0842   PROTEINUR >=300 (A) 11/29/2019 0842   UROBILINOGEN 0.2 09/15/2013 0052   NITRITE NEGATIVE 11/29/2019 0842   LEUKOCYTESUR SMALL (A) 11/29/2019 0842    Assessment/Plan:   Principal Problem:   Type 2 diabetes mellitus with diabetic foot infection ([place]) Active Problems:   Renal failure   Anemia   Uncontrolled hypertension   CKD (chronic kidney disease), stage V 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 Right foot with clear, malodorous drainage, over past few weeks. Leg with chronic erythema but recent development of erythematous streaking. No  purulent drainage, foot pain, or reports of fever. On presentation, WBC and lactic acid WNL, patient afebrile, no evidence for osteomyelitis on radiograph or MR. Patient given vanc+zosyn in ER *Continue vancomycin+ceftriaxone+metronidazole, will consider discontinuing vancomycin if MRSA PCR screen results negative *Trend WBC, fever curve *Ortho recommends IV antibiotics, outpatient followup. No surgical intervention indicated at this time  # AKI vs. Likely CKD Stage V Creatinine  on presentation of 5.04, last baseline of 0.88 in May 2015. Picture appears consistent with CKD 2/2 to chronic DM and HTN. Renal ultrasound demonstrates medical renal disease, without evidence for obstruction. Urinalysis with 5 ketones, protein > 300, small leukocytes. Protein/Creatinine ratio elevated at 5.93. No WBC or casts on microscopy.Patient was given 4 L since admission without improvement in creatinine. Patient does have borderline-elevated protein gap of 4 which could suggest multiple myeloma although calcium is not elevated. Potassium was 5.4 on presentation, now WNL. Bicarb of 17.  *Nephrology on board, we appreciate their continued recommendations *Followup bladder scan *Followup CT renal stone study *Strict I/Os *Sodium bicarbonate 650 mg three times daily  # Anemia Hemoglobin 8.5 on presentation (MCV 101.0), last baseline of 12.5 in May 2015.  Vitamin B12 within normal limits at 349. Will check folate level. Anemia may be secondary to CKD *Trend CBC *Followup Folate level  # Shortness of breath Patient reports exertional shortness of breath and associated cough, denies orthopnea, patient without smoking history.  Patient with by basilar crackles on exam, vascular congestion on chest x-ray.  While the opacities could represent infiltrates, patient otherwise without significant evidence for respiratory infection.  Volume overload may be secondary to CKD. *Check TTE  # HTN On presentation, patient with elevated blood pressure up to 190/109.  Blood pressure has remained elevated during hospitalization, lowest of 169/143. *We will start patient on amlodipine 10 mg daily *Will need PCP follow-up  # T2DM Patient with history of diabetes on insulin but has not used insulin in past 3 years as he lost his Medicare coverage.  Glucose of 119 on presentation, hemoglobin A1c of 5.5 from 12.12 in February 2015.  Lower hemoglobin A1c may be secondary to progression of renal disease. *Continue  sliding scale insulin  PT/OT: Consulted Diet: Renal/carb modified DVT Ppx: Heparin 5000 U Q8HRs Admit Status: Inpatient Dispo: Anticipated discharge pending further medical workup  Jeanmarie Hubert, MD 11/29/2019, 8:24 AM

## 2019-11-29 NOTE — Consult Note (Signed)
[facility] Initial Consult Note  [name] PCP: Patient, No Pcp Per Admit Date: 11/28/2019 Requesting Physician: Sid Falcon, MD Reason for Consult: AKI  Assessment/ Plan:   Assessment & Plan  This is a 64 year old male with past history known for insulin-dependent diabetes, hepatitis, hypertension, s/p left AKA and status post right partial foot amputation with overall medical follow-up in the last 5 years who presents with right lower extremity infection and acute renal injury.  1. AKI, non-oliguric vs. Likely CKD Five years ago, patient admitted for RLE infection and had normal kidney function with creatinine 0.88 and GFR greater than 90.  Chronicity of kidney disease is not clear. Given longstanding history of hypertension - severely elevated in the ED (and diabetes? - A1C 5.5) without medical treatment over 5 years, it is likely that patient does have some element of chronic kidney disease. Additionally supported by renal US with echogenicity.   On admission, patient's creatinine was 5.04 and up to 5.19 this morning.  He had a mild hyperkalemia on admission which has now resolved but continues to have with low calcium and and elevated BUN to 78. Did not meet sepsis criteria. Chest x-ray with bilateral pleural effusions with vascular congestion. Initial urine collected after antibiotics shows proteinuria, 5 ketones, triple phosphate cyrstals, WBC, no hgb.  Patient denies hematuria, dysuria, decreased UOP or changes in urination. Patient does note some SOB that started about two weeks ago. He does not take any OTC or Rx medications at home. On exam, patient has 2+ pitting RLE to the knee, no edema on right. Could be due to infection vs. Volume overload. Abdomen soft NTTP, ND. No fluid wave but does have flank dullness with percussion. Mild dfficulty completing sentences.  -Strict I's and O's -Follow-up urinalysis and micrology -Saline lock  -consider [place] to r/o cirrhosis  with hx hepatitis in chart and/or echo  -avoid lowering BP quickly for perfusion - decreased amlodipine to 5 mg  2. Proteinuria  - UPC pending  3. New O2 requirement  CXR with effusion. Satting well on 2L O2. Would hold on lasix if not in respiratory distress at this time.  4. Right lower extremity of infection, without abscess or osteomyelitis  Orthopedics saw patient with no urgent surgery planned.  Follow-up outpatient in 2 weeks.  5. Anemia, macrocytic In 2015, hemoglobin 11-12. Hemoglobin this admission at 9. ROS for blood loss negative. Likely due to nutritional deficiency and ACD. Per primary. No indication for transfusion.  -follow up B12 6. Hypertension Elevated in the ED. Monitor for improvement with lasix. Rest per primary.  - amlodipine 5mg   - avoid decreasing pressure quickly  7. IDDM- per primary   Medications:   Home Meds:  Current Outpatient Medications  Medication Instructions  . HYDROcodone-acetaminophen (NORCO) 5-325 MG per tablet 1-2 tablets, Oral, Every 6 hours PRN  . Multiple Vitamin (MULTIVITAMIN WITH MINERALS) TABS tablet 1 tablet, Oral, Daily    Inpatient Meds: Scheduled Meds: . heparin  5,000 Units Subcutaneous Q8H  . hydrocerin   Topical BID  . insulin aspart  0-5 Units Subcutaneous QHS  . insulin aspart  0-9 Units Subcutaneous TID WC  . sodium chloride flush  3 mL Intravenous Once  . vancomycin variable dose per unstable renal function (pharmacist dosing)   Does not apply See admin instructions   Continuous Infusions: . cefTRIAXone (ROCEPHIN)  IV Stopped (11/28/19 2217)  . metronidazole Stopped (11/28/19 2335)   PRN Meds:.labetalol  Subjective:   HPI:  Ian  [name] is a 64 y.o. male with pmhx s/f  has a past medical history of Diabetes mellitus without complication ([place]), Hepatitis, Hypertension, Hypokalemia, Hyponatremia, Necrotizing fasciitis ([place]), and Pneumonia.. They presented to the ED with and found to have RLE infection. Patient reports  that this has been going on for longer than 2 weeks. He didn't seek care until it started weeping. No fevers, chills at home. No dysuria, changes in urination. No OTC or Rx medications. Lives at home with his sister and brother. Has not had medical care in several years as medicare was cancelled (?), though patient does have medicare cards with him today.    ED Course:  S/p zosyn, vancomycin, flagyl and ceftraixone. LR 59ml/hr x 4 hours overnight.   ROS Balance of 12 systems is negative w/ exceptions as above  PMH  Past Medical History:  Diagnosis Date  . Diabetes mellitus without complication (H[facility])    PATIENT JUST LEARNED HE WAS DIABETIC  . Hepatitis   . Hypertension   . Hypokalemia   . Hyponatremia   . Necrotizing fasciitis ([place])   . Pneumonia    HX OF PNA   PSH  Past Surgical History:  Procedure Laterality Date  . AMPUTATION Left 09/11/2013   Procedure: AMPUTATION BELOW KNEE;  Surgeon: Marianna Payment, MD;  Location: [facility];  Service: Orthopedics;  Laterality: Left;  . AMPUTATION Right 12/18/2013   Procedure: RIGHT FOOT 1,2, TOE AMPUTATION  5th toe RAY AMPUTATION;  Surgeon: Marianna Payment, MD;  Location: [facility];  Service: Orthopedics;  Laterality: Right;  . CHOLECYSTECTOMY    . I & D EXTREMITY Left 09/03/2013   Procedure: IRRIGATION AND DEBRIDEMENT EXTREMITY;  Surgeon: Marianna Payment, MD;  Location: [facility];  Service: Orthopedics;  Laterality: Left;  . I & D EXTREMITY Left 09/11/2013   Procedure: LEFT FOOT IRRIGATION AND DEBRIDEMENT;  Surgeon: Marianna Payment, MD;  Location: [facility];  Service: Orthopedics;  Laterality: Left;   FH  Family History  Problem Relation Age of Onset  . Diabetes type II Mother   . Dementia Mother   . Heart disease Father    [place]  reports that he has never smoked. He has never used smokeless tobacco. He reports that he does not drink alcohol or use drugs. Allergies  Allergies  Allergen Reactions  . Tramadol Nausea And Vomiting    Pt states he  took this on an empty stomach one time and it came right back up. He's not had it since.     Objective:   Physical Exam   BP (!) 177/107 (BP Location: Right Arm)   Pulse (!) 101   Temp 97.9 F (36.6 C) (Oral)   Resp 20   Ht 5\' 3"  (1.6 m)   Wt 73.4 kg   SpO2 98%   BMI 28.66 kg/m  Filed Weights   11/28/19 2000  Weight: 73.4 kg    GEN: ill appearing male, unkempt, poor hygeine  ENT: oropharynx clear, poor dentition EYES: non icteric, non injected. PERRLA, EOMI  CV: RRR, normal S1, S2, no murmur appreicated  PULM: decreased lung sounds throughout. Mild accessory muscle use when asked to breathe deeply. Unable to identify crackles/wheezing ABD: soft, NT, ND, + BS. Flank dullness, no fluid wave.  SKIN: chest with large coalesced patch of light brownish elevated plaques that spares skin folds.  EXT:RLE erythematous and streaking, 2+ LEE. 2+ RP bilaterally. No edema in arms.  NEURO: A&O x 4. Normal behavior, mannerisms. Normal speech.  No FND   Labs: CBC Recent Labs  Lab 11/28/19 1330 11/28/19 1804 11/29/19 0426  WBC 8.9  --  6.9  NEUTROABS 7.1  --   --   HGB 8.5* 8.8* 9.5*  HCT 29.0* 26.0* 31.9*  MCV 101.0*  --  99.7  PLT 214  --  449   Basic Metabolic Panel Recent Labs  Lab 11/28/19 1330 11/28/19 1804 11/29/19 0426  NA 143 144 143  K 5.4* 5.0 4.9  CL 112* 114* 114*  CO2 17*  --  17*  GLUCOSE 119* 90 138*  BUN 78* 82* 79*  CREATININE 5.04* 5.10* 5.19*  CALCIUM 8.7*  --  8.3*    Creatinine, Ser (mg/dL)  Date Value  11/29/2019 5.19 (H)  11/28/2019 5.10 (H)  11/28/2019 5.04 (H)  12/21/2013 0.88  12/20/2013 0.89  12/19/2013 0.90  12/18/2013 0.95  11/11/2013 0.88  10/14/2013 0.96  09/23/2013 1.16    Pertinent Imaging:   US Renal  Result Date: 11/28/2019 CLINICAL DATA:  Renal failure. EXAM: RENAL / URINARY TRACT ULTRASOUND COMPLETE COMPARISON:  None. FINDINGS: Right Kidney: Renal measurements: 10.60 cm x 4.20 cm x 3.90 cm = volume: 90.90 mL. There is  diffusely increased echogenicity of the renal parenchyma. No mass or hydronephrosis visualized. Left Kidney: Renal measurements: 9.90 cm x 5.10 cm x 3.70 cm = volume: 96.10 mL. There is diffusely increased echogenicity of the renal parenchyma. No mass or hydronephrosis visualized. Bladder: There is mild urinary bladder wall thickening. Other: Bilateral ureteral jets are visualized. IMPRESSION: 1. Increased echogenicity of the renal parenchyma which is likely secondary to medical renal disease. Electronically Signed   By: Virgina Norfolk M.D.   On: 11/28/2019 19:30   MR FOOT RIGHT WO CONTRAST  Result Date: 11/28/2019 CLINICAL DATA:  Lower extremity abscess EXAM: MRI OF THE RIGHT FOREFOOT WITHOUT CONTRAST TECHNIQUE: Multiplanar, multisequence MR imaging of the right was performed. No intravenous contrast was administered. COMPARISON:  None. FINDINGS: Bones/Joint/Cartilage The patient is status post transmetatarsal amputation of the fifth digit and has had prior amputation of the first and second amputations at the trans phalangeal joints. Healed fracture deformity of the fourth metatarsal shaft is seen. No areas of cortical destruction or periosteal reaction seen throughout the remainder of the osseous structures. No large joint effusions are seen. Ligaments The Lisfranc ligaments are intact. Muscles and Tendons There is extensive fatty atrophy of muscles of the forefoot. Visualized portions of the flexor and extensor tendons are intact. The plantar fascia is intact. The Achilles tendon is intact. Soft tissues There is a focal area of possible skin ulceration seen along the amputation site at the first metatarsal head. No loculated fluid collections or sinus tract are seen. There is fat pad inflammation seen. IMPRESSION: Status post amputation of the first, second, and fifth digits. Area of skin irregularity seen overlying the amputation site at the first digit. However no loculated fluid collections or evidence  of osteomyelitis. Electronically Signed   By: Prudencio Pair M.D.   On: 11/28/2019 22:07   DG Chest Port 1 View  Result Date: 11/28/2019 CLINICAL DATA:  Right leg swelling EXAM: PORTABLE CHEST 1 VIEW COMPARISON:  12/18/2013 FINDINGS: Small bilateral pleural effusions. Basilar airspace disease. Vascular congestion with diffuse interstitial and hazy lung opacity. No pneumothorax. IMPRESSION: 1. Bilateral pleural effusions with basilar atelectasis or pneumonia 2. Vascular congestion with diffuse interstitial and hazy lung opacity suspect for pulmonary edema. Electronically Signed   By: Donavan Foil M.D.   On: 11/28/2019 20:36  DG Foot Complete Right  Result Date: 11/28/2019 CLINICAL DATA:  Foot infection EXAM: RIGHT FOOT COMPLETE - 3+ VIEW COMPARISON:  11/11/2013 FINDINGS: Since the prior exam, there is been interval resection of the first and second digits at the level of the MTP joints. Transmetatarsal fifth ray resection. Healed posttraumatic deformity of the distal fourth metatarsal. No evidence of cortical destruction or periostitis to suggest acute osteomyelitis. No acute fracture. No dislocation. Bidirectional calcaneal enthesophytes. Vascular calcifications. Soft tissue swelling at the distal aspect of the foot. No soft tissue gas. IMPRESSION: 1. No radiographic evidence of acute osteomyelitis. 2. Interval postsurgical changes to the right foot, as above. 3. Soft tissue swelling of the distal aspect of the foot. No soft tissue gas. Electronically Signed   By: Davina Poke D.O.   On: 11/28/2019 14:22    Zettie Cooley, MD [place] Resident, PGY2 11/29/2019, 8:43 AM

## 2019-11-29 NOTE — Progress Notes (Signed)
Pt admitted to [facility]W from ED. A/O X4. Call bell in place.Skin assessed with Elisa RN. Stage2 was noted on right and left sacrum. Foam dressing placed.Will continue to treat and monitor per MD orders.

## 2019-11-30 LAB — CBC
HCT: 24.6 % — ABNORMAL LOW (ref 39.0–52.0)
Hemoglobin: 7.5 g/dL — ABNORMAL LOW (ref 13.0–17.0)
MCH: 29.9 pg (ref 26.0–34.0)
MCHC: 30.5 g/dL (ref 30.0–36.0)
MCV: 98 fL (ref 80.0–100.0)
Platelets: 194 10*3/uL (ref 150–400)
RBC: 2.51 MIL/uL — ABNORMAL LOW (ref 4.22–5.81)
RDW: 16.9 % — ABNORMAL HIGH (ref 11.5–15.5)
WBC: 7.4 10*3/uL (ref 4.0–10.5)
nRBC: 0 % (ref 0.0–0.2)

## 2019-11-30 LAB — GLUCOSE, CAPILLARY
Glucose-Capillary: 85 mg/dL (ref 70–99)
Glucose-Capillary: 126 mg/dL — ABNORMAL HIGH (ref 70–99)
Glucose-Capillary: 143 mg/dL — ABNORMAL HIGH (ref 70–99)
Glucose-Capillary: 151 mg/dL — ABNORMAL HIGH (ref 70–99)

## 2019-11-30 LAB — BASIC METABOLIC PANEL
Anion gap: 11 (ref 5–15)
BUN: 76 mg/dL — ABNORMAL HIGH (ref 8–23)
CO2: 18 mmol/L — ABNORMAL LOW (ref 22–32)
Calcium: 8.3 mg/dL — ABNORMAL LOW (ref 8.9–10.3)
Chloride: 116 mmol/L — ABNORMAL HIGH (ref 98–111)
Creatinine, Ser: 5.12 mg/dL — ABNORMAL HIGH (ref 0.61–1.24)
GFR calc Af Amer: 13 mL/min — ABNORMAL LOW (ref >60)
GFR calc non Af Amer: 11 mL/min — ABNORMAL LOW (ref >60)
Glucose, Bld: 93 mg/dL (ref 70–99)
Potassium: 4.7 mmol/L (ref 3.5–5.1)
Sodium: 145 mmol/L (ref 135–145)

## 2019-11-30 LAB — MRSA PCR SCREENING: MRSA by PCR: NEGATIVE

## 2019-11-30 MED ORDER — FUROSEMIDE 10 MG/ML IJ SOLN
60.0000 mg | Freq: Once | INTRAMUSCULAR | Status: AC
  Administered 2019-11-30: 60 mg via INTRAVENOUS

## 2019-11-30 MED ORDER — TAMSULOSIN HCL 0.4 MG PO CAPS
0.4000 mg | ORAL_CAPSULE | Freq: Every day | ORAL | Status: DC
  Administered 2019-11-30 – 2019-12-16 (×17): 0.4 mg via ORAL
  Filled 2019-11-30 (×17): qty 1

## 2019-11-30 MED ORDER — METOPROLOL TARTRATE 25 MG PO TABS
25.0000 mg | ORAL_TABLET | Freq: Two times a day (BID) | ORAL | Status: DC
  Administered 2019-11-30 – 2019-12-03 (×7): 25 mg via ORAL
  Filled 2019-11-30 (×7): qty 1

## 2019-11-30 MED ORDER — DARBEPOETIN ALFA 40 MCG/0.4ML IJ SOSY
40.0000 ug | PREFILLED_SYRINGE | Freq: Once | INTRAMUSCULAR | Status: AC
  Administered 2019-11-30: 40 ug via SUBCUTANEOUS
  Filled 2019-11-30: qty 0.4

## 2019-11-30 MED ORDER — CHLORHEXIDINE GLUCONATE CLOTH 2 % EX PADS
6.0000 | MEDICATED_PAD | Freq: Every day | CUTANEOUS | Status: DC
  Administered 2019-11-30 – 2019-12-03 (×4): 6 via TOPICAL

## 2019-11-30 NOTE — Plan of Care (Signed)

## 2019-11-30 NOTE — Progress Notes (Signed)
MRSA lab ordered from 4/29 collected this AM and sent to lab.

## 2019-11-30 NOTE — Progress Notes (Signed)
Dressing on RLE removed. Site cleaned and Eucerin re-applied. New dressing placed. Site does have one open pink circular wound on lateral R shin.

## 2019-11-30 NOTE — Evaluation (Signed)
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 Also found to have bilateral pleural effusions Admitted 11/28/19 for treatment of cellulitis via IV antibiotics and determination of AKIvsCKD. WBAT on R LE  Clinical Impression  PTA pt living with sister and brother in mobile home with 7 steps to enter. Pt reports independence with household level ambulation and ADLs, family assists with iADLs. Pt is currently limited in safe mobility by decreased awareness of his medical condition, in presence of generalized weakness and decreased endurance. Pt requires modA for sit>stand and min A for ambulation of 12 feet with RW and L AKA prosthetic. PT recommending HHPT for improving strength and balance for optimal mobilization in his home environment. PT will continue to follow acutely     Follow Up Recommendations Home health PT;Supervision/Assistance - 24 hour    Equipment Recommendations  None recommended by PT       Precautions / Restrictions Precautions Precautions: Fall Required Braces or Orthoses: (L AKA prosthetic) Restrictions Weight Bearing Restrictions: No Other Position/Activity Restrictions: (WBAT on R LE)      Mobility  Bed Mobility               General bed mobility comments: sitting EoB on entry   Transfers Overall transfer level: Needs assistance Equipment used: Rolling walker (2 wheeled) Transfers: Sit to/from Stand Sit to Stand: Mod assist            Ambulation/Gait Ambulation/Gait assistance: Min assist Gait Distance (Feet): 12 Feet Assistive device: Rolling walker (2 wheeled)(L AKA prosthetic) Gait Pattern/deviations: Step-to pattern;Decreased step length - right;Decreased  step length - left;Trunk flexed Gait velocity: slowed Gait velocity interpretation: <1.31 ft/sec, indicative of household ambulator General Gait Details: min A for steadying with slow, mildly unsteady gait, no c/o of R LE pain during ambulation         Balance Overall balance assessment: Needs assistance Sitting-balance support: No upper extremity supported;Feet supported;Feet unsupported Sitting balance-Leahy Scale: Good     Standing balance support: Bilateral upper extremity supported Standing balance-Leahy Scale: Poor Standing balance comment: requires UE support to maintain balance                             Pertinent Vitals/Pain Pain Assessment: No/denies pain    Home Living Family/patient expects to be discharged to:: Private residence Living Arrangements: Other relatives(brother and sister) Available Help at Discharge: Family;Available PRN/intermittently Type of Home: Mobile home Home Access: Stairs to enter Entrance Stairs-Rails: Right;Left Entrance Stairs-Number of Steps: 7 Home Layout: One level Home Equipment: Walker - 2 wheels;Cane - single point;Wheelchair - manual      Prior Function Level of Independence: Needs assistance   Gait / Transfers Assistance Needed: ambulates household distances with prosthetic and RW   ADL's / Homemaking Assistance Needed: independent with ADLs, family assists with iADLs        Hand Dominance        Extremity/Trunk Assessment   Upper Extremity Assessment Upper Extremity Assessment: Overall WFL for tasks assessed    Lower Extremity Assessment Lower Extremity Assessment: LLE deficits/detail;RLE deficits/detail RLE Deficits / Details: R foot partial amputation, knee and hip strength grossly 4/5 RLE Sensation: decreased light touch  RLE Coordination: decreased fine motor LLE Deficits / Details: L AKA, hip ROM WFL, strength 4/5       Communication   Communication: No difficulties  Cognition  Arousal/Alertness: Awake/alert Behavior During Therapy: WFL for tasks assessed/performed Overall Cognitive Status: Impaired/Different from baseline Area of Impairment: Safety/judgement                         Safety/Judgement: Decreased awareness of safety;Decreased awareness of deficits(level of infection of R LE prior to seeking treatment)     General Comments: poor medical literacy       General Comments General comments (skin integrity, edema, etc.): Pt on 2L O2 via [place] on entry with SaO2 96%O2, with ambulation pt with 3/4 DoE and SaO2 dropped to 88%O2, 2L via [place] replaced and pt rebounded to 92%O2, dressing on R foot and LE clean, dry and intact, placed foam dressing on exposed wound pt scratched on L buttocks        Assessment/Plan    PT Assessment Patient needs continued PT services  PT Problem List Decreased strength;Decreased activity tolerance;Decreased balance;Decreased mobility;Decreased safety awareness;Decreased cognition;Decreased skin integrity       PT Treatment Interventions Gait training;Stair training;Functional mobility training;Therapeutic activities;Therapeutic exercise;Balance training;Cognitive remediation;Patient/family education    PT Goals (Current goals can be found in the Care Plan section)  Acute Rehab PT Goals Patient Stated Goal: get better PT Goal Formulation: With patient Time For Goal Achievement: 12/14/19 Potential to Achieve Goals: Good    Frequency Min 3X/week    AM-PAC PT "6 Clicks" Mobility  Outcome Measure Help needed turning from your back to your side while in a flat bed without using bedrails?: None Help needed moving from lying on your back to sitting on the side of a flat bed without using bedrails?: A Little Help needed moving to and from a bed to a chair (including a wheelchair)?: A Lot Help needed standing up from a chair using your arms (e.g., wheelchair or bedside chair)?: A Lot Help needed to walk in hospital  room?: A Little Help needed climbing 3-5 steps with a railing? : Total 6 Click Score: 15    End of Session Equipment Utilized During Treatment: Gait belt;Oxygen Activity Tolerance: Patient limited by fatigue Patient left: in bed;with call bell/phone within reach;with bed alarm set;with nursing/sitter in room(sitting EoB) Nurse Communication: Mobility status PT Visit Diagnosis: Unsteadiness on feet (R26.81);Other abnormalities of gait and mobility (R26.89);Muscle weakness (generalized) (M62.81);Difficulty in walking, not elsewhere classified (R26.2)    Time: 1779-3903 PT Time Calculation (min) (ACUTE ONLY): 7 min   Charges:   PT Evaluation $PT Eval Moderate Complexity: 1 Mod PT Treatments $Gait Training: 8-22 mins        Ivey Cina B. Migdalia Dk PT, DPT Acute Rehabilitation Services Pager [phone] Office [phone]   [place] 11/30/2019, 10:50 AM

## 2019-11-30 NOTE — Progress Notes (Signed)
[facility] Progress Note  Name: [name] MRN: [id number] DOB: 18-Apr-1956  Chief Complaint:  Right leg weeping  Subjective:  Had 1 liter UOP charted over 4/30.  States harder to urinate after his scrotal swelling started.  States it starts and stops  Review of systems:  Denies n/v Reports some shortness of breath  No chest pain  Urine stream starts/stops    Intake/Output Summary (Last 24 hours) at 11/30/2019 1013 Last data filed at 11/30/2019 0948 Gross per 24 hour  Intake 1280 ml  Output 1000 ml  Net 280 ml    Vitals:  Vitals:   11/30/19 0000 11/30/19 0523 11/30/19 0637 11/30/19 0800  BP:   (!) 165/91 (!) 169/96  Pulse:    94  Resp:    (!) 25  Temp: 97.9 F (36.6 C) 98 F (36.7 C) 98 F (36.7 C) 97.6 F (36.4 C)  TempSrc: Oral Oral Oral Oral  SpO2:    95%  Weight:   69.9 kg   Height:         Physical Exam:  General adult male in bed in no acute distress HEENT normocephalic atraumatic extraocular movements intact sclera anicteric Neck supple trachea midline Lungs clear but reduced bilaterally at bases normal work of breathing at rest  Heart regular rate and rhythm no rubs or gallops appreciated; scrotal edema Abdomen soft nontender nondistended Extremities 1-2+ edema right leg which is wrapped ; left residual limb no edema   Psych normal mood and affect Neuro - alert and oriented x 3; provides hx and follows commands  Medications reviewed   Labs:  BMP Latest Ref Rng & Units 11/30/2019 11/29/2019 11/28/2019  Glucose 70 - 99 mg/dL 93 138(H) 90  BUN 8 - 23 mg/dL 76(H) 79(H) 82(H)  Creatinine 0.61 - 1.24 mg/dL 5.12(H) 5.19(H) 5.10(H)  Sodium 135 - 145 mmol/L 145 143 144  Potassium 3.5 - 5.1 mmol/L 4.7 4.9 5.0  Chloride 98 - 111 mmol/L 116(H) 114(H) 114(H)  CO2 22 - 32 mmol/L 18(L) 17(L) -  Calcium 8.9 - 10.3 mg/dL 8.3(L) 8.3(L) -     Assessment/Plan:   # AKI - Last known creatinine was 1 but this was several years ago.  He now has AKI  versus CKD.  Chronic insults of diabetes and hypertension with acute insults of prerenal in the setting of infection.  Renal ultrasound suggestive of at least some degree of CKD.  echo with diastolic and systolic dysfunction - For now continue supportive measures - lasix IV once - check post -void residual bladder scan and place foley if over 250 mL urine retained  - Start flomax  # Proteinuria - Likely secondary to diabetes.  up/cr ratio   # Acute on chronic systolic and diastolic CHF  - lasix IV once today   # Hypertension - noted amlodipine increased - lasix once as above   # Metabolic acidosis - Setting of AKI/CKD. - on sodium bicarb  # Diabetic right foot infection - Per primary team  # Macrocytic anemia - No acute indication for packed red blood cells - aranesp 40 mcg IV once on 5/1  - check iron stores  # Medical noncompliance - Complicates his treatment and AKI/CKD is likely at least in part from hypertension diabetes  Possible UTI  - Check urine cx - note on abx  Claudia Desanctis, MD 11/30/2019 10:23 AM

## 2019-11-30 NOTE — Plan of Care (Signed)
  Problem: Education: Goal: Knowledge of General Education information will improve Description Including pain rating scale, medication(s)/side effects and non-pharmacologic comfort measures Outcome: Progressing   Problem: Health Behavior/Discharge Planning: Goal: Ability to manage health-related needs will improve Outcome: Progressing   Problem: Clinical Measurements: Goal: Ability to maintain clinical measurements within normal limits will improve Outcome: Progressing   Problem: Activity: Goal: Risk for activity intolerance will decrease Outcome: Progressing   Problem: Nutrition: Goal: Adequate nutrition will be maintained Outcome: Progressing   Problem: Coping: Goal: Level of anxiety will decrease Outcome: Progressing   Problem: Elimination: Goal: Will not experience complications related to bowel motility Outcome: Progressing Goal: Will not experience complications related to urinary retention Outcome: Progressing   Problem: Pain Managment: Goal: General experience of comfort will improve Outcome: Progressing   Problem: Safety: Goal: Ability to remain free from injury will improve Outcome: Progressing   

## 2019-12-01 DIAGNOSIS — L8990 Pressure ulcer of unspecified site, unspecified stage: Secondary | ICD-10-CM | POA: Clinically undetermined

## 2019-12-01 LAB — BASIC METABOLIC PANEL
Anion gap: 13 (ref 5–15)
BUN: 79 mg/dL — ABNORMAL HIGH (ref 8–23)
CO2: 19 mmol/L — ABNORMAL LOW (ref 22–32)
Calcium: 7.9 mg/dL — ABNORMAL LOW (ref 8.9–10.3)
Chloride: 111 mmol/L (ref 98–111)
Creatinine, Ser: 5.45 mg/dL — ABNORMAL HIGH (ref 0.61–1.24)
GFR calc Af Amer: 12 mL/min — ABNORMAL LOW (ref >60)
GFR calc non Af Amer: 10 mL/min — ABNORMAL LOW (ref >60)
Glucose, Bld: 124 mg/dL — ABNORMAL HIGH (ref 70–99)
Potassium: 4.6 mmol/L (ref 3.5–5.1)
Sodium: 143 mmol/L (ref 135–145)

## 2019-12-01 LAB — GLUCOSE, CAPILLARY
Glucose-Capillary: 111 mg/dL — ABNORMAL HIGH (ref 70–99)
Glucose-Capillary: 105 mg/dL — ABNORMAL HIGH (ref 70–99)
Glucose-Capillary: 163 mg/dL — ABNORMAL HIGH (ref 70–99)
Glucose-Capillary: 144 mg/dL — ABNORMAL HIGH (ref 70–99)

## 2019-12-01 LAB — URINE CULTURE: Culture: NO GROWTH

## 2019-12-01 LAB — CBC
HCT: 22.9 % — ABNORMAL LOW (ref 39.0–52.0)
Hemoglobin: 7 g/dL — ABNORMAL LOW (ref 13.0–17.0)
MCH: 29.4 pg (ref 26.0–34.0)
MCHC: 30.6 g/dL (ref 30.0–36.0)
MCV: 96.2 fL (ref 80.0–100.0)
Platelets: 190 10*3/uL (ref 150–400)
RBC: 2.38 MIL/uL — ABNORMAL LOW (ref 4.22–5.81)
RDW: 17.1 % — ABNORMAL HIGH (ref 11.5–15.5)
WBC: 7.7 10*3/uL (ref 4.0–10.5)
nRBC: 0 % (ref 0.0–0.2)

## 2019-12-01 LAB — FOLATE RBC
Folate, Hemolysate: 255 ng/mL
Folate, RBC: 1000 ng/mL (ref >498)
Hematocrit: 25.5 % — ABNORMAL LOW (ref 37.5–51.0)

## 2019-12-01 LAB — CULTURE, BLOOD (ROUTINE X 2): Culture: NO GROWTH

## 2019-12-01 LAB — VANCOMYCIN, RANDOM: Vancomycin Rm: 15

## 2019-12-01 LAB — PREPARE RBC (CROSSMATCH)

## 2019-12-01 MED ORDER — FUROSEMIDE 10 MG/ML IJ SOLN
80.0000 mg | Freq: Once | INTRAMUSCULAR | Status: AC
  Administered 2019-12-01: 80 mg via INTRAVENOUS
  Filled 2019-12-01: qty 8

## 2019-12-01 MED ORDER — SODIUM CHLORIDE 0.9% IV SOLUTION: Freq: Once | INTRAVENOUS | Status: AC

## 2019-12-01 MED ORDER — DOXYCYCLINE HYCLATE 100 MG PO TABS
100.0000 mg | ORAL_TABLET | Freq: Two times a day (BID) | ORAL | Status: AC
  Administered 2019-12-01 – 2019-12-07 (×14): 100 mg via ORAL
  Filled 2019-12-01 (×14): qty 1

## 2019-12-01 NOTE — Progress Notes (Signed)
[facility] Progress Note  Name: [name] MRN: [id number] DOB: [birth year]  Chief Complaint:  Right leg weeping  Subjective:  Got lasix on 5/1 and had 2.6 liters UOP over 5/1.  States that his breathing is much better today.  Had a foley placed.  Post void residual was 376 ml.  States was harder to urinate after his scrotal swelling started.  States stream was starting and stopping.  We discussed that he has at best significant kidney disease and that we are watching closely for the need for dialysis.   We discussed the risks/benefits/indications for PRBC's and he consents to receive blood.    Review of systems:  Denies n/v Reports some shortness of breath but is much better No chest pain    Intake/Output Summary (Last 24 hours) at 12/01/2019 1321 Last data filed at 12/01/2019 0835 Gross per 24 hour  Intake 990.86 ml  Output 1900 ml  Net -909.14 ml    Vitals:  Vitals:   12/01/19 0338 12/01/19 0400 12/01/19 0600 12/01/19 0800  BP:    133/83  Pulse: 72   84  Resp: (!) 24  17   Temp:  97.8 F (36.6 C)  97.8 F (36.6 C)  TempSrc:  Oral  Oral  SpO2: 98%   98%  Weight: 72.2 kg     Height:         Physical Exam:  General adult male in bed in no acute distress HEENT normocephalic atraumatic extraocular movements intact sclera anicteric Neck supple trachea midline Lungs clear but reduced bilaterally at bases normal work of breathing at rest  Heart S1S2 no rub Abdomen soft nontender nondistended Extremities 2+ edema right leg which is wrapped ; left residual limb no edema   Psych calm mood and affect Neuro - alert and oriented x 3; provides hx and follows commands  Medications reviewed   Labs:  BMP Latest Ref Rng & Units 12/01/2019 11/30/2019 11/29/2019  Glucose 70 - 99 mg/dL 124(H) 93 138(H)  BUN 8 - 23 mg/dL 79(H) 76(H) 79(H)  Creatinine 0.61 - 1.24 mg/dL 5.45(H) 5.12(H) 5.19(H)  Sodium 135 - 145 mmol/L 143 145 143  Potassium 3.5 - 5.1 mmol/L 4.6 4.7  4.9  Chloride 98 - 111 mmol/L 111 116(H) 114(H)  CO2 22 - 32 mmol/L 19(L) 18(L) 17(L)  Calcium 8.9 - 10.3 mg/dL 7.9(L) 8.3(L) 8.3(L)     Assessment/Plan:   # AKI - Last known creatinine was 1 but this was several years ago.  He now has AKI versus CKD.  Chronic insults of diabetes and hypertension with acute insults of prerenal in the setting of infection.  Renal ultrasound suggestive of at least some degree of CKD.  echo with diastolic and systolic dysfunction - For now continue supportive measures - lasix IV once - Optimize anemia as below - PRBC's ordered for 5/2 - Continue foley and flomax  # Proteinuria - Likely secondary to diabetes.  up/cr ratio 5930 mg/g    # Acute on chronic systolic and diastolic CHF  - lasix IV once today   # Hypertension - noted amlodipine was increased - would transition off of amlodipine given the heart failure - now on metoprolol  - lasix once as above   # Urinary retention  - Continue foley  - Continue flomax  # Metabolic acidosis - Setting of AKI/CKD. - on sodium bicarb and for lasix   # Diabetic right foot infection - Per primary team  # Macrocytic anemia  - transfuse  1 unit PRBC's for 5/2 (ordered) - aranesp 40 mcg IV once on 5/1  - check iron stores  # Medical noncompliance - Complicates his treatment and AKI/CKD is likely at least in part from hypertension diabetes  Possible UTI  - urine cx no growth - obtained while on abx for his RLE cellulitis   Claudia Desanctis, MD 12/01/2019 1:33 PM

## 2019-12-01 NOTE — Progress Notes (Addendum)
Subjective: Patient reports that he is doing better today, reports that he is still having some shortness of breath. Reports that he has been urinating okay. He denies any foot pain. Discussed that he may need further diuresis today.   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 Subsequently noted to have new onset heart failure.   RLE cellulitis: Has been on vancomycin, rocephin, and flagyl since admission. MRSA negative, stopping vancomycin. MRI showed no evidence of OM or abscess. Ortho evaluated, recommended continued antibiotics and outpatient follow up. Patient reports that he is not having any leg pain today, denies having any pain since he has been here. He remains afebrile with no leukocytosis. Blood cultures show NGTD x3 days. Overall doing well from this standpoint, will transition to PO antibiotics today.  -D/c vancomycin -Discontinue Rocephin and flagyl day 3 -Start Doxycycline -F/u blood cultures -Outpatient orthopedics follow up needed  Renal dysfunction: Metabolic acidosis: Unclear if this is  AKI vs CKD however it's likely 2/2 CKD given his history of HTN and DM that have been untreated. Bicarb was down to 17 when he came in and sodium bicarb was started. Noted to be fluid overloaded and he received 1 dose of IV lasix. Input of 1.5L, output of 2.5, net output of 1L yesterday.  Started on flomax for possible urinary retention.   -Nephology following, appreciate recommendations -Continue Na Bicarb -Strict I+Os  Pyuria:  U/A showed small leukocytes, no nitires, protein >300, many bacteria, and >50 WBCs. Does report some urinary issues. Urine cultures pending. Has been receiving rocephin for his cellulitis.  -F/u urine cultures  Acute hypoxic respiratory failure 2/2 pulmonary edema and bilateral pleural effusions Chest imaging on admission significant pulm edema and bilateral pleural effusions. Still requiring 2L supplemental O2.   -Wean supplemental O2 as able. -Suspect to see improvement with volume removal  Combined heart failure Moderate LVH Echocardiogram with EF 30-35% with global LV hypokinesis and grade II diastolic dysfunction. No prior echo available for comparison in chart. Volume overload likely combination of severe heart failure and renal disease. Will need to get started on heart failure regimen however renal disease prohibiting use of ACE or spironlactone  -Continuous tele monitoring, strict I/O, daily weights, fluid restriction -Will need cardiology follow up for ischemic evaluation -Amlodipine 5mg  -25mg  metoprolol bid  Hypertension: Blood pressure on admission 190/109. Started amlodipine and metoprolol 25 mg BID during admission and received 1 dose IV lasix yesterday. BP  better controlled today, around 130/80s.   -Amlodipine 5mg  -25mg  metoprolol bid  T2DM. A1C 5.5 on admission however likely falsely lowered in setting of anemia and CKD. He had been using insulin until 3 years ago when he lost medicare coverage. Will continue to SSI for now and he will need  outpatient establishment with PCP. CBGs controlled while admitted, has not received any insulin overnight.   -Continue SSI  Goals of care. Would likely benefit from outpatient palliative visit given his severe and numerous comorbidities.  Prior to Admission Living Arrangement: Home Anticipated Discharge Location: TBD Barriers to Discharge: Pending clinical improvement Dispo: Anticipated discharge in approximately 2-3 day(s).   Asencion Noble, MD 12/01/2019, 6:31 AM Pager: [phone]

## 2019-12-01 NOTE — Progress Notes (Signed)
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 Also found to have bilateral pleural effusions Admitted 11/28/19 for treatment of cellulitis via IV antibiotics and determination of AKIvsCKD. WBAT on R LE   Clinical Impression   PTA pt lived with siblings, independent in all ADL and mobility tasks. Pt ambulates with a RW and reports that family completes IADLs. Pt does not use oxygen at home and is currently on 1.5L at the hospital. SpO2 maintained in 90s throughout session. Pt currently independent to mod assist for self-care and mobility tasks. Pt able to transfer to/from [facility] and ambulate to bedroom sink with RW and min guard. 0 instances of LOB with pt demonstrating good safety awareness. Pt tolerated standing 5 min with RW to complete hygiene tasks. No signs/symptoms of distress. Pt demonstrates decreased strength, endurance, balance, standing tolerance, and activity tolerance impacting ability to complete self-care and functional transfer tasks. Recommend skilled OT services to address above deficits in order to promote function and prevent further decline. Recommend [place] OT for continued rehab following hospital discharge.     Follow Up Recommendations  Home health OT;Supervision/Assistance - 24 hour    Equipment Recommendations  3 in 1 bedside commode    Recommendations for Other Services       Precautions / Restrictions Precautions Precautions: Fall;Other (comment) Precaution Comments: LLE prosthesis. Left AKA Restrictions Weight Bearing Restrictions: No(Per ortho: WBAT RLE)      Mobility Bed Mobility Overal bed mobility: Needs Assistance Bed Mobility: Supine to Sit     Supine to sit:  Supervision;HOB elevated     General bed mobility comments: To ensure balance and safety  Transfers Overall transfer level: Needs assistance Equipment used: Rolling walker (2 wheeled) Transfers: Sit to/from [facility] Sit to Stand: Min guard Stand pivot transfers: Min guard       General transfer comment: To ensure balance and safety.     Balance Overall balance assessment: Needs assistance Sitting-balance support: Feet supported Sitting balance-Leahy Scale: Good       Standing balance-Leahy Scale: Poor Standing balance comment: requires UE support to maintain balance                           ADL either performed or assessed with clinical judgement   ADL Overall ADL's : Needs assistance/impaired Eating/Feeding: Independent;Sitting   Grooming: Min guard;Standing Grooming Details (indicate cue type and reason): While standing at the sink Upper Body Bathing: Set up;Supervision/ safety;Sitting   Lower Body Bathing: Supervison/ safety;Min guard;Sit to/from stand Lower Body Bathing Details (indicate cue type and reason): Supervision while seated, min guard while standing to complete Upper Body Dressing : Set up;Supervision/safety;Sitting   Lower Body Dressing: Moderate assistance;Sitting/lateral leans Lower Body Dressing Details (indicate cue type and reason): Pt able to don/doff LLE prosthesis, requiring assist to don RLE sock.  Toilet Transfer: Loss adjuster, chartered and Hygiene: Min guard;Sit to/from stand       Functional mobility during ADLs: Min guard;Rolling walker General ADL Comments: Pt able to transfer to/from [facility] and ambulate to/from bedroom wink with RW and min guard. Noted 0 instances of LOB. Pt tolerated standing ~5 min.  Vision         Perception     Praxis      Pertinent Vitals/Pain Pain Assessment: No/denies pain     Hand Dominance Right   Extremity/Trunk Assessment Upper  Extremity Assessment Upper Extremity Assessment: Generalized weakness   Lower Extremity Assessment Lower Extremity Assessment: Defer to PT evaluation       Communication Communication Communication: No difficulties   Cognition Arousal/Alertness: Awake/alert Behavior During Therapy: WFL for tasks assessed/performed Overall Cognitive Status: Within Functional Limits for tasks assessed                                 General Comments: A&Ox4. Pt pleasant and willing to participate in therapy. Pt able to follow multi-step instructions without difficulty.    General Comments  Pt on 1.5L [place] with SpO2 maintaining in 90s throughout. Pt reports that he does not use oxygen at home.     Exercises     Shoulder Instructions      Home Living Family/patient expects to be discharged to:: Private residence Living Arrangements: Other relatives(brother and sister) Available Help at Discharge: Family;Available PRN/intermittently Type of Home: Mobile home Home Access: Stairs to enter Entrance Stairs-Number of Steps: 7 Entrance Stairs-Rails: Right;Left Home Layout: One level     Bathroom Shower/Tub: Teacher, early years/pre: Standard Bathroom Accessibility: Yes   Home Equipment: Environmental consultant - 2 wheels;Cane - single point;Wheelchair - manual          Prior Functioning/Environment Level of Independence: Needs assistance  Gait / Transfers Assistance Needed: ambulates household distances with prosthetic and RW. Pt reports 1 fall in the last 6 months. ADL's / Homemaking Assistance Needed: independent with ADLs, family assists with IADLs            OT Problem List: Decreased strength;Decreased activity tolerance;Impaired balance (sitting and/or standing);Cardiopulmonary status limiting activity      OT Treatment/Interventions: Self-care/ADL training;Therapeutic exercise;Neuromuscular education;Energy conservation;DME and/or AE instruction;Therapeutic  activities;Patient/family education;Balance training    OT Goals(Current goals can be found in the care plan section) Acute Rehab OT Goals Patient Stated Goal: to go home Time For Goal Achievement: 12/15/19 Potential to Achieve Goals: Good ADL Goals Pt Will Perform Grooming: with modified independence;standing Pt Will Perform Lower Body Bathing: with modified independence;sit to/from stand Pt Will Perform Lower Body Dressing: with modified independence;sit to/from stand Pt Will Transfer to Toilet: with modified independence;ambulating;regular height toilet Pt Will Perform Toileting - Clothing Manipulation and hygiene: with modified independence;sit to/from stand Additional ADL Goal #1: Pt to recall and verbalize 3 energy conservation strategies with 0 verbal cues.  OT Frequency: Min 2X/week   Barriers to D/C:            Co-evaluation              AM-PAC OT "6 Clicks" Daily Activity     Outcome Measure Help from another person eating meals?: None Help from another person taking care of personal grooming?: A Little Help from another person toileting, which includes using toliet, bedpan, or urinal?: A Little Help from another person bathing (including washing, rinsing, drying)?: A Little Help from another person to put on and taking off regular upper body clothing?: A Little Help from another person to put on and taking off regular lower body clothing?: A Lot 6 Click Score: 18   End of Session Equipment Utilized During Treatment: Gait belt;Rolling walker Nurse Communication: Mobility status  Activity  Tolerance: Patient tolerated treatment well Patient left: in bed;with call bell/phone within reach;with nursing/sitter in room  OT Visit Diagnosis: Unsteadiness on feet (R26.81);Muscle weakness (generalized) (M62.81)                Time: 5465-0354 OT Time Calculation (min): 29 min Charges:  OT General Charges $OT Visit: 1 Visit OT Evaluation $OT Eval Moderate Complexity: 1  Mod OT Treatments $Self Care/Home Management : 8-22 mins  Mauri Brooklyn OTR/L [phone]  Mauri Brooklyn 12/01/2019, 1:27 PM

## 2019-12-02 ENCOUNTER — Encounter (HOSPITAL_COMMUNITY): Payer: Medicare Other

## 2019-12-02 LAB — BASIC METABOLIC PANEL
Anion gap: 9 (ref 5–15)
BUN: 83 mg/dL — ABNORMAL HIGH (ref 8–23)
CO2: 20 mmol/L — ABNORMAL LOW (ref 22–32)
Calcium: 7.4 mg/dL — ABNORMAL LOW (ref 8.9–10.3)
Chloride: 112 mmol/L — ABNORMAL HIGH (ref 98–111)
Creatinine, Ser: 6.19 mg/dL — ABNORMAL HIGH (ref 0.61–1.24)
GFR calc Af Amer: 10 mL/min — ABNORMAL LOW (ref >60)
GFR calc non Af Amer: 9 mL/min — ABNORMAL LOW (ref >60)
Glucose, Bld: 131 mg/dL — ABNORMAL HIGH (ref 70–99)
Potassium: 4.5 mmol/L (ref 3.5–5.1)
Sodium: 141 mmol/L (ref 135–145)

## 2019-12-02 LAB — TYPE AND SCREEN
ABO/RH(D): O NEG
Antibody Screen: NEGATIVE
Unit division: 0

## 2019-12-02 LAB — GLUCOSE, CAPILLARY
Glucose-Capillary: 118 mg/dL — ABNORMAL HIGH (ref 70–99)
Glucose-Capillary: 110 mg/dL — ABNORMAL HIGH (ref 70–99)
Glucose-Capillary: 138 mg/dL — ABNORMAL HIGH (ref 70–99)
Glucose-Capillary: 175 mg/dL — ABNORMAL HIGH (ref 70–99)
Glucose-Capillary: 129 mg/dL — ABNORMAL HIGH (ref 70–99)

## 2019-12-02 LAB — BPAM RBC
Blood Product Expiration Date: 202105192359
ISSUE DATE / TIME: 202105021521
Unit Type and Rh: 9500

## 2019-12-02 LAB — CBC
HCT: 26.8 % — ABNORMAL LOW (ref 39.0–52.0)
Hemoglobin: 8.5 g/dL — ABNORMAL LOW (ref 13.0–17.0)
MCH: 29.7 pg (ref 26.0–34.0)
MCHC: 31.7 g/dL (ref 30.0–36.0)
MCV: 93.7 fL (ref 80.0–100.0)
Platelets: 173 10*3/uL (ref 150–400)
RBC: 2.86 MIL/uL — ABNORMAL LOW (ref 4.22–5.81)
RDW: 18.2 % — ABNORMAL HIGH (ref 11.5–15.5)
WBC: 8 10*3/uL (ref 4.0–10.5)
nRBC: 0 % (ref 0.0–0.2)

## 2019-12-02 NOTE — Progress Notes (Signed)
SATURATION QUALIFICATIONS: (This note is used to comply with regulatory documentation for home oxygen)  Patient Saturations on Room Air at Rest = 94%  Patient Saturations on Room Air while Ambulating = 92%  Please briefly explain why patient needs home oxygen: N/A. Patient's oxygen sat only dropped to 92% when ambulating with PT.

## 2019-12-02 NOTE — Progress Notes (Signed)
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  Subjective/Interm history  No overnight events. Discussed continued decline in renal function since admission and possible need for HD in the near future.   Significant Hospital Events   4/29 hospital admission 4/30 ortho and neph consulted. Sodium bicarb started 5/1 40mg  lasix>1.6L out. Lopressor started. 5/2 transfused 1U PRBC for hgb of 7. Vanc/rocephin/flagyl stopped>transitioned to doxy. flomax started for urinary retention.  5/3 hgb 8.5.  Objective   Blood pressure 133/83, pulse 94, temperature 98.1 F (36.7 C), temperature source Oral, resp. rate (!) 27, height 5\' 3"  (1.6 m), weight 72.2 kg, SpO2 97 %.     Intake/Output Summary (Last 24 hours) at 12/02/2019 0517 Last data filed at 12/01/2019 2108 Gross per 24 hour  Intake 960 ml  Output 1050 ml  Net -90 ml   Filed Weights   11/28/19 2000 11/30/19 0637 12/01/19 0338  Weight: 73.4 kg 69.9 kg 72.2 kg    Examination: GENERAL: in no acute distress CARDIAC: heart RRR. No peripheral edema.  PULMONARY: breathing comfortably on room air. Lung sounds diminished at the bases SKIN: erythema/edema with scabs to right foot. Compression wrapping in place.  Consults:  Orthopedics nephology   Significant Diagnostic Tests:  4/29 R foot MRI>>no evidence of loculated fluid collection or osteomyelitis  4/29 CXR>>b/l effusions with basilar atelectasis vs pna; vascular congestion with diffuse interstitial opacities suspicious for pulm edema  4/30 CT renal study>>no acute abnormalities in abd/pelvis.  4/30 echocardiogram>>Left ventricular ejection fraction, by estimation, is 30 to 35%. The left ventricle has moderately decreased  function. The left ventricle demonstrates global hypokinesis. Diffuse hypokinesis worse in the inferior base .  The left ventricular internal cavity size was mildly dilated. There is moderate left ventricular hypertrophy. Left ventricular diastolic parameters are consistent with Grade II diastolic dysfunction (pseudonormalization). Elevated left ventricular end-diastolic pressure. Right ventricular systolic function is moderately reduced. The right ventricular size is mildly enlarged. The mitral valve is normal in structure. Mild mitral valve egurgitation. No evidence of mitral stenosis. The aortic valve is tricuspid. Aortic valve regurgitation is not  visualized. Mild aortic valve sclerosis is present, with no evidence of aortic valve stenosis. The inferior vena cava is dilated in size with <50% respiratory  variability, suggesting right atrial pressure of 15 mmHg.   Micro Data:  4/29 blood cultures>>NGTD 5/2 urine cultures>>NGTD  Antimicrobials:  Vancomycin 4/29>>5/2 Rocephin 4/29>>5/2 Flagyl 4/29>>5/2 Doxycycline 5/2>>  Labs    CBC Latest Ref Rng & Units 12/02/2019 12/01/2019 11/30/2019  WBC 4.0 - 10.5 K/uL 8.0 7.7 7.4  Hemoglobin 13.0 - 17.0 g/dL 8.5(L) 7.0(L) 7.5(L)  Hematocrit 39.0 - 52.0 % 26.8(L) 22.9(L) 24.6(L)  Platelets 150 - 400 K/uL 173 190 194   BMP Latest Ref Rng & Units 12/01/2019 11/30/2019 11/29/2019  Glucose 70 - 99 mg/dL 124(H) 93 138(H)  BUN 8 - 23 mg/dL 79(H) 76(H) 79(H)  Creatinine 0.61 - 1.24 mg/dL 5.45(H) 5.12(H) 5.19(H)  Sodium 135 - 145 mmol/L 143 145 143  Potassium 3.5 - 5.1 mmol/L 4.6 4.7 4.9  Chloride 98 - 111 mmol/L 111 116(H) 114(H)  CO2 22 - 32 mmol/L 19(L) 18(L) 17(L)  Calcium 8.9 - 10.3 mg/dL 7.9(L) 8.3(L) 8.3(L)  Summary  64 yo male with T2DM and HTN who was admitted to IMTS on 4/29 for purulent cellulitis of RLE.  Assessment & Plan:  Principal Problem:   Type 2 diabetes mellitus with diabetic foot infection ([place]) Active Problems:   Renal failure    Anemia   Uncontrolled hypertension   CKD (chronic kidney disease), stage V (HCC)   Shortness of breath   Pressure injury of skin  Nonpurulent cellulitis of the RLE in the setting of venous/lymphatic insufficiency and T2DM -MRI on admission neg for abscess or osteomyelitis -Ortho consulted and recommending continued antibiotics and outpatient follow up. No surgical management recommended at this time. -received 3d course of vanc/rocephin/flagyl 4/29-5/2. Transitioned to doxycycline day #3. Assessment: remains afebrile without leukocytosis. NGTD on blood cultures Plan --continue doxycycline--day #4/10 --follow blood cultures --Continue compression wrap  CKD V with metabolic acidosis. medical renal disease on admission [place], no acute findings on admission CT renal study. urine p/c ratio 5930 -nephrology on board with ongoing discussions for possible need for HD -labs indicate progressive renal failure however no uremic symptoms this morning. Continues to produce good amount of urine.  Urinary retention. flomax started 5/2 Bacturia. Received 3d course of broad spectrum abx. Plan Sodium bicarb 650mg  tid  Continue flomax Strict I/O Appreciate nephrology recommendations  Newly diagnosed Combined heart failure. Echocardiogram with EF 30-35% with global LV hypokinesis and grade II diastolic dysfunction. Renal function precluding spiro/ACEI/ARB initiation Uncontrolled hypertension. Blood pressure on admission 190/109. Lopressor started 5/1 Assessment: now normotensive. Does not appear hypervolemic on exam -5/2 I/Os: 2.1L UOP. Net -875mL. Weight -1.2kg Plan -Continue lopressor 25mg  po -Continuous tele monitoring, strict I/O, daily weights, fluid restriction  Acute hypoxic respiratory failure due to pulmonary edema and bilateral pleural effusions likely due to decompensated combined heart failure vs CKD 5 -Improving. off supplemental O2 at rest this morning.  Plan: Ambulating pulse  ox  Normocytic anemia. Likely CKD related. -1U PRBC transfused 5/2 Assessment: post transfusion hgb 8.5  Plan: daily cbc. Transfuse for hgb <7.  Borderline gamma gap--4. Can not rule out plasma dyscrasia disorder. Not hypercalcemic. No B sx.  Plan: will obtain SPEP to evaluate   T2DM. Sugars stable. Continue SSI  Goals of care. Would likely benefit from outpatient palliative visit given his severe and numerous comorbidities.  Best practice:  CODE STATUS: full Diet: renal, cardiac, fl restriction DVT for prophylaxis: heparin Social considerations/Family communication: TOC to assist with insurance Dispo: discharge home pending continued renal monitoring and need for HD per neph. Ortho follow up at discharge.   [name], MD INTERNAL MEDICINE RESIDENT PGY-1 PAGER #: [phone] 12/02/19  5:17 AM

## 2019-12-02 NOTE — Progress Notes (Signed)
Orthopedic Tech Progress Note Patient Details:  [name] Apr 04, 1956 [id number] RN called requesting a POST OP SHOE for patient . fitted him with a shoe and sat shoe in window Ortho Devices Type of Ortho Device: Postop shoe/boot Ortho Device/Splint Location: RLE Ortho Device/Splint Interventions: Application   Post Interventions Patient Tolerated: Well Instructions Provided: Care of [place] 12/02/2019, 3:46 PM

## 2019-12-02 NOTE — Progress Notes (Signed)
Patient ID: [name], male   DOB: 06/02/1956, 64 y.o.   MRN: [id number] S: No complaints.  Denies any history of kidney disease but has not seen a physician in at least 5 years.  And has not taken any of his insulin for 5 years as well. O:BP 138/83 (BP Location: Right Arm)   Pulse 85   Temp 98.1 F (36.7 C) (Oral)   Resp (!) 29   Ht 5\' 3"  (1.6 m)   Wt 72.2 kg   SpO2 95%   BMI 28.20 kg/m   Intake/Output Summary (Last 24 hours) at 12/02/2019 1528 Last data filed at 12/02/2019 4696 Gross per 24 hour  Intake 480 ml  Output 1650 ml  Net -1170 ml   Intake/Output: I/O last 3 completed shifts: In: 1439.7 [P.O.:1200; IV Piggyback:239.7] Out: 2900 [Urine:2900]  Intake/Output this shift:  No intake/output data recorded. Weight change:  Gen: NAD CVS: no rub Resp: cta Abd: +BS, soft, NT/ND Ext: 1+ pitting edema, s/p LBKA  Recent Labs  Lab 11/28/19 1330 11/28/19 1804 11/29/19 0426 11/30/19 0443 12/01/19 0616 12/02/19 0435  NA 143 144 143 145 143 141  K 5.4* 5.0 4.9 4.7 4.6 4.5  CL 112* 114* 114* 116* 111 112*  CO2 17*  --  17* 18* 19* 20*  GLUCOSE 119* 90 138* 93 124* 131*  BUN 78* 82* 79* 76* 79* 83*  CREATININE 5.04* 5.10* 5.19* 5.12* 5.45* 6.19*  ALBUMIN 2.8*  --  2.6*  --   --   --   CALCIUM 8.7*  --  8.3* 8.3* 7.9* 7.4*  AST 15  --  16  --   --   --   ALT 14  --  14  --   --   --    Liver Function Tests: Recent Labs  Lab 11/28/19 1330 11/29/19 0426  AST 15 16  ALT 14 14  ALKPHOS 100 90  BILITOT 1.0 1.0  PROT 6.9 6.6  ALBUMIN 2.8* 2.6*   No results for input(s): LIPASE, AMYLASE in the last 168 hours. No results for input(s): AMMONIA in the last 168 hours. CBC: Recent Labs  Lab 11/28/19 1330 11/28/19 1804 11/29/19 0426 11/29/19 1103 11/30/19 0443 12/01/19 0616 12/02/19 0435  WBC 8.9  --  6.9  --  7.4 7.7 8.0  NEUTROABS 7.1  --   --   --   --   --   --   HGB 8.5*   < > 9.5*  --  7.5* 7.0* 8.5*  HCT 29.0*   < > 31.9*   < > 24.6* 22.9* 26.8*  MCV  101.0*  --  99.7  --  98.0 96.2 93.7  PLT 214  --  188  --  194 190 173   < > = values in this interval not displayed.   Cardiac Enzymes: No results for input(s): CKTOTAL, CKMB, CKMBINDEX, TROPONINI in the last 168 hours. CBG: Recent Labs  Lab 12/01/19 1654 12/01/19 2114 12/02/19 0740 12/02/19 1244 12/02/19 1422  GLUCAP 163* 144* 118* 110* 138*    Iron Studies: No results for input(s): IRON, TIBC, TRANSFERRIN, FERRITIN in the last 72 hours. Studies/Results: No results found. . Chlorhexidine Gluconate Cloth  6 each Topical Daily  . doxycycline  100 mg Oral Q12H  . heparin  5,000 Units Subcutaneous Q8H  . hydrocerin   Topical BID  . insulin aspart  0-5 Units Subcutaneous QHS  . insulin aspart  0-9 Units Subcutaneous TID WC  . metoprolol tartrate  25 mg Oral BID  . sodium bicarbonate  650 mg Oral TID  . sodium chloride flush  3 mL Intravenous Once  . tamsulosin  0.4 mg Oral QHS    BMET    Component Value Date/Time   NA 141 12/02/2019 0435   K 4.5 12/02/2019 0435   CL 112 (H) 12/02/2019 0435   CO2 20 (L) 12/02/2019 0435   GLUCOSE 131 (H) 12/02/2019 0435   BUN 83 (H) 12/02/2019 0435   CREATININE 6.19 (H) 12/02/2019 0435   CALCIUM 7.4 (L) 12/02/2019 0435   GFRNONAA 9 (L) 12/02/2019 0435   GFRAA 10 (L) 12/02/2019 0435   CBC    Component Value Date/Time   WBC 8.0 12/02/2019 0435   RBC 2.86 (L) 12/02/2019 0435   HGB 8.5 (L) 12/02/2019 0435   HCT 26.8 (L) 12/02/2019 0435   HCT 25.5 (L) 11/29/2019 1103   PLT 173 12/02/2019 0435   MCV 93.7 12/02/2019 0435   MCH 29.7 12/02/2019 0435   MCHC 31.7 12/02/2019 0435   RDW 18.2 (H) 12/02/2019 0435   LYMPHSABS 0.7 11/28/2019 1330   MONOABS 0.5 11/28/2019 1330   EOSABS 0.5 11/28/2019 1330   BASOSABS 0.0 11/28/2019 1330     Assessment/Plan:  1. AKI vs. But more likely Subacute/progressive CKD due to poorly controlled DM and HTN for past 5 years as well as acute CHF.  BUN/CR rising with diuresis.  No urgent indication for  dialysis at this time and will continue to follow.   2. Cardiomyopathy with EF of 30-35% and grade II diastolic dysfunction.  Markedly volume overloaded.  Likely with underlying CAD.  Given IV lasix.  Will need cardiology consultation and further workup/follow up.  3. Anemia of CKD- will check iron stores and will likely need to start ESA.  Will also check SPEP/UPEP. 4. HTN- likely has been longstanding and undiagnosed/treated with end organ damage (CKD, LVH, hypertensive heart disease).  Improved with medications.   Donetta Potts, MD [employer] [phone]

## 2019-12-02 NOTE — Progress Notes (Addendum)
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 Also found to have bilateral pleural effusions Admitted 11/28/19 for treatment of cellulitis via IV antibiotics and determination of AKIvsCKD. WBAT on R LE    PT Comments    Pt was able to tolerate ambulation today on RA with stable O2 sats but did have 3/4 DOE and needed frequent rest breaks.  Required cues for safe transfer techniques and use of RW.  Will cont to benefit from PT.  Does have at least 5 STE home and would benefit from stair training as able.  States he does not have 24 hr support at home. Pt unable to wear shoe on R - may benefit from post op shoe - notified RN.     Follow Up Recommendations  Pt reports does not have 24 hour assist and may benefit from SNF if does not progress.      Equipment Recommendations  None recommended by PT;Other (comment)(May benefit from post op shoe for R)    Recommendations for Other Services       Precautions / Restrictions Precautions Precautions: Fall;Other (comment) Precaution Comments: Left AKA - LLE prosthesis Restrictions Other Position/Activity Restrictions: WBAT RLE    Mobility  Bed Mobility Overal bed mobility: Needs Assistance Bed Mobility: Supine to Sit     Supine to sit: Supervision;HOB elevated     General bed mobility comments: To ensure balance and safety  Transfers Overall transfer level: Needs assistance Equipment used: Rolling walker (2 wheeled) Transfers: Sit to/from Stand Sit to Stand: Min guard         General transfer comment: sit to stand x 4; cues for safe hand placement  Ambulation/Gait Ambulation/Gait assistance: Min guard Gait Distance (Feet): 30 Feet(15',30',  then 15' each with sitting rest breaks) Assistive device: Rolling walker (2 wheeled) Gait Pattern/deviations: Decreased stride length;Step-to pattern;Decreased stance time - right Gait velocity: slowed   General Gait Details: cues for posture and RW; required seated rest breaks as pt fatigued easily   Stairs             Wheelchair Mobility    Modified Rankin (Stroke Patients Only)       Balance Overall balance assessment: Needs assistance Sitting-balance support: Feet supported Sitting balance-Leahy Scale: Good       Standing balance-Leahy Scale: Fair                              Cognition Arousal/Alertness: Awake/alert Behavior During Therapy: WFL for tasks assessed/performed                                          Exercises      General Comments General comments (skin integrity, edema, etc.): Pt on RA at arrival.  O2 sats were stable throughotu. 92% with ambulation      Pertinent Vitals/Pain Pain Assessment: No/denies pain    Home Living                      Prior Function  PT Goals (current goals can now be found in the care plan section) Acute Rehab PT Goals Patient Stated Goal: to go home PT Goal Formulation: With patient Time For Goal Achievement: 12/14/19 Potential to Achieve Goals: Good Progress towards PT goals: Progressing toward goals    Frequency    Min 3X/week      PT Plan Current plan remains appropriate    Co-evaluation              AM-PAC PT "6 Clicks" Mobility   Outcome Measure  Help needed turning from your back to your side while in a flat bed without using bedrails?: None Help needed moving from lying on your back to sitting on the side of a flat bed without using bedrails?: None Help needed moving to and from a bed to a chair (including a wheelchair)?: None Help needed standing up from a chair using your arms (e.g., wheelchair or bedside chair)?: None Help  needed to walk in hospital room?: A Little Help needed climbing 3-5 steps with a railing? : A Lot 6 Click Score: 21    End of Session Equipment Utilized During Treatment: Gait belt Activity Tolerance: Patient limited by fatigue Patient left: in bed;with call bell/phone within reach;with bed alarm set(sitting EOB) Nurse Communication: Mobility status PT Visit Diagnosis: Unsteadiness on feet (R26.81);Other abnormalities of gait and mobility (R26.89);Muscle weakness (generalized) (M62.81);Difficulty in walking, not elsewhere classified (R26.2)     Time: 3354-5625 PT Time Calculation (min) (ACUTE ONLY): 30 min  Charges:  $Gait Training: 8-22 mins $Therapeutic Activity: 8-22 mins                     Maggie Font, PT Acute Rehab Services Pager [phone] [facility] [phone] [facility] [phone]    Karlton Lemon 12/02/2019, 1:44 PM

## 2019-12-03 ENCOUNTER — Inpatient Hospital Stay (HOSPITAL_COMMUNITY): Payer: Medicare Other

## 2019-12-03 DIAGNOSIS — E11628 Type 2 diabetes mellitus with other skin complications: Secondary | ICD-10-CM | POA: Diagnosis not present

## 2019-12-03 DIAGNOSIS — N185 Chronic kidney disease, stage 5: Secondary | ICD-10-CM | POA: Diagnosis not present

## 2019-12-03 DIAGNOSIS — I5021 Acute systolic (congestive) heart failure: Secondary | ICD-10-CM | POA: Diagnosis not present

## 2019-12-03 DIAGNOSIS — L0390 Cellulitis, unspecified: Secondary | ICD-10-CM | POA: Diagnosis not present

## 2019-12-03 DIAGNOSIS — I10 Essential (primary) hypertension: Secondary | ICD-10-CM | POA: Diagnosis not present

## 2019-12-03 DIAGNOSIS — L089 Local infection of the skin and subcutaneous tissue, unspecified: Secondary | ICD-10-CM

## 2019-12-03 LAB — PROTEIN ELECTROPHORESIS, SERUM
A/G Ratio: 0.9 (ref 0.7–1.7)
Albumin ELP: 2.7 g/dL — ABNORMAL LOW (ref 2.9–4.4)
Alpha-1-Globulin: 0.3 g/dL (ref 0.0–0.4)
Alpha-2-Globulin: 0.9 g/dL (ref 0.4–1.0)
Beta Globulin: 0.9 g/dL (ref 0.7–1.3)
Gamma Globulin: 1 g/dL (ref 0.4–1.8)
Globulin, Total: 3.1 g/dL (ref 2.2–3.9)
Total Protein ELP: 5.8 g/dL — ABNORMAL LOW (ref 6.0–8.5)

## 2019-12-03 LAB — RENAL FUNCTION PANEL
Albumin: 2.2 g/dL — ABNORMAL LOW (ref 3.5–5.0)
Anion gap: 15 (ref 5–15)
BUN: 87 mg/dL — ABNORMAL HIGH (ref 8–23)
CO2: 18 mmol/L — ABNORMAL LOW (ref 22–32)
Calcium: 7.5 mg/dL — ABNORMAL LOW (ref 8.9–10.3)
Chloride: 107 mmol/L (ref 98–111)
Creatinine, Ser: 6.28 mg/dL — ABNORMAL HIGH (ref 0.61–1.24)
GFR calc Af Amer: 10 mL/min — ABNORMAL LOW (ref >60)
GFR calc non Af Amer: 9 mL/min — ABNORMAL LOW (ref >60)
Glucose, Bld: 162 mg/dL — ABNORMAL HIGH (ref 70–99)
Phosphorus: 5.7 mg/dL — ABNORMAL HIGH (ref 2.5–4.6)
Potassium: 4.3 mmol/L (ref 3.5–5.1)
Sodium: 140 mmol/L (ref 135–145)

## 2019-12-03 LAB — IRON AND TIBC
Iron: 51 ug/dL (ref 45–182)
Saturation Ratios: 22 % (ref 17.9–39.5)
TIBC: 232 ug/dL — ABNORMAL LOW (ref 250–450)
UIBC: 181 ug/dL

## 2019-12-03 LAB — GLUCOSE, CAPILLARY
Glucose-Capillary: 128 mg/dL — ABNORMAL HIGH (ref 70–99)
Glucose-Capillary: 163 mg/dL — ABNORMAL HIGH (ref 70–99)
Glucose-Capillary: 134 mg/dL — ABNORMAL HIGH (ref 70–99)
Glucose-Capillary: 140 mg/dL — ABNORMAL HIGH (ref 70–99)

## 2019-12-03 LAB — CBC
HCT: 26.7 % — ABNORMAL LOW (ref 39.0–52.0)
Hemoglobin: 8.3 g/dL — ABNORMAL LOW (ref 13.0–17.0)
MCH: 29.3 pg (ref 26.0–34.0)
MCHC: 31.1 g/dL (ref 30.0–36.0)
MCV: 94.3 fL (ref 80.0–100.0)
Platelets: 183 10*3/uL (ref 150–400)
RBC: 2.83 MIL/uL — ABNORMAL LOW (ref 4.22–5.81)
RDW: 17.8 % — ABNORMAL HIGH (ref 11.5–15.5)
WBC: 8.6 10*3/uL (ref 4.0–10.5)
nRBC: 0 % (ref 0.0–0.2)

## 2019-12-03 LAB — IMMUNOFIXATION, URINE

## 2019-12-03 LAB — CULTURE, BLOOD (ROUTINE X 2): Special Requests: ADEQUATE

## 2019-12-03 LAB — VITAMIN D 25 HYDROXY (VIT D DEFICIENCY, FRACTURES): Vit D, 25-Hydroxy: 6.26 ng/mL — ABNORMAL LOW (ref 30–100)

## 2019-12-03 LAB — FERRITIN: Ferritin: 352 ng/mL — ABNORMAL HIGH (ref 24–336)

## 2019-12-03 MED ORDER — CARVEDILOL 12.5 MG PO TABS
12.5000 mg | ORAL_TABLET | Freq: Two times a day (BID) | ORAL | Status: DC
  Administered 2019-12-04 (×2): 12.5 mg via ORAL
  Filled 2019-12-03 (×2): qty 1

## 2019-12-03 MED ORDER — HYDRALAZINE HCL 10 MG PO TABS
10.0000 mg | ORAL_TABLET | Freq: Three times a day (TID) | ORAL | Status: DC
  Administered 2019-12-03 – 2019-12-06 (×8): 10 mg via ORAL
  Filled 2019-12-03 (×8): qty 1

## 2019-12-03 MED ORDER — ISOSORBIDE MONONITRATE ER 30 MG PO TB24
15.0000 mg | ORAL_TABLET | Freq: Every day | ORAL | Status: DC
  Administered 2019-12-03 – 2019-12-16 (×13): 15 mg via ORAL
  Filled 2019-12-03 (×14): qty 1

## 2019-12-03 MED ORDER — METOPROLOL TARTRATE 50 MG PO TABS: 50.0000 mg | ORAL_TABLET | Freq: Two times a day (BID) | ORAL | Status: DC

## 2019-12-03 NOTE — Progress Notes (Signed)
Patient ID: [name], male   DOB: 12-16-1955, 64 y.o.   MRN: [id number] S: No new complaints. O:BP (!) 144/92 (BP Location: Left Arm)   Pulse 84   Temp 98.1 F (36.7 C) (Oral)   Resp 19   Ht 5\' 3"  (1.6 m)   Wt 73 kg   SpO2 99%   BMI 28.51 kg/m   Intake/Output Summary (Last 24 hours) at 12/03/2019 1247 Last data filed at 12/03/2019 0241 Gross per 24 hour  Intake 360 ml  Output 1100 ml  Net -740 ml   Intake/Output: I/O last 3 completed shifts: In: 840 [P.O.:840] Out: 1900 [Urine:1900]  Intake/Output this shift:  No intake/output data recorded. Weight change:  Gen: NAD CVS: no rub Resp: cta Abd: +BS, soft, Nt/ND Ext: 1+ pitting edema on right, s/p LBKA  Recent Labs  Lab 11/28/19 1330 11/28/19 1804 11/29/19 0426 11/30/19 0443 12/01/19 0616 12/02/19 0435 12/03/19 0531  NA 143 144 143 145 143 141 140  K 5.4* 5.0 4.9 4.7 4.6 4.5 4.3  CL 112* 114* 114* 116* 111 112* 107  CO2 17*  --  17* 18* 19* 20* 18*  GLUCOSE 119* 90 138* 93 124* 131* 162*  BUN 78* 82* 79* 76* 79* 83* 87*  CREATININE 5.04* 5.10* 5.19* 5.12* 5.45* 6.19* 6.28*  ALBUMIN 2.8*  --  2.6*  --   --   --  2.2*  CALCIUM 8.7*  --  8.3* 8.3* 7.9* 7.4* 7.5*  PHOS  --   --   --   --   --   --  5.7*  AST 15  --  16  --   --   --   --   ALT 14  --  14  --   --   --   --    Liver Function Tests: Recent Labs  Lab 11/28/19 1330 11/29/19 0426 12/03/19 0531  AST 15 16  --   ALT 14 14  --   ALKPHOS 100 90  --   BILITOT 1.0 1.0  --   PROT 6.9 6.6  --   ALBUMIN 2.8* 2.6* 2.2*   No results for input(s): LIPASE, AMYLASE in the last 168 hours. No results for input(s): AMMONIA in the last 168 hours. CBC: Recent Labs  Lab 11/28/19 1330 11/28/19 1804 11/29/19 0426 11/29/19 1103 11/30/19 0443 11/30/19 0443 12/01/19 0616 12/02/19 0435 12/03/19 0531  WBC 8.9  --  6.9  --  7.4   < > 7.7 8.0 8.6  NEUTROABS 7.1  --   --   --   --   --   --   --   --   HGB 8.5*   < > 9.5*  --  7.5*   < > 7.0* 8.5* 8.3*  HCT  29.0*   < > 31.9*   < > 24.6*   < > 22.9* 26.8* 26.7*  MCV 101.0*  --  99.7  --  98.0  --  96.2 93.7 94.3  PLT 214  --  188  --  194   < > 190 173 183   < > = values in this interval not displayed.   Cardiac Enzymes: No results for input(s): CKTOTAL, CKMB, CKMBINDEX, TROPONINI in the last 168 hours. CBG: Recent Labs  Lab 12/02/19 1244 12/02/19 1422 12/02/19 1723 12/02/19 2104 12/03/19 0802  GLUCAP 110* 138* 175* 129* 128*    Iron Studies:  Recent Labs    12/03/19 0531  IRON 51  TIBC  232*  FERRITIN 352*   Studies/Results: VAS [place] ABI WITH/WO TBI  Result Date: 12/03/2019 LOWER EXTREMITY DOPPLER STUDY Indications: Ulceration, and cellulitis RLE and left BKA. Other Factors: Right great toe amputation.  Comparison Study: no prior noted Performing Technologist: June Leap Rvt, Rdms  Examination Guidelines: A complete evaluation includes at minimum, Doppler waveform signals and systolic blood pressure reading at the level of bilateral brachial, anterior tibial, and posterior tibial arteries, when vessel segments are accessible. Bilateral testing is considered an integral part of a complete examination. Photoelectric Plethysmograph (PPG) waveforms and toe systolic pressure readings are included as required and additional duplex testing as needed. Limited examinations for reoccurring indications may be performed as noted.  ABI Findings: +--------+------------------+-----+---------+--------+ Right   Rt Pressure (mmHg)IndexWaveform Comment  +--------+------------------+-----+---------+--------+ [id number]                    triphasic         +--------+------------------+-----+---------+--------+ ATA     255               1.66 biphasic          +--------+------------------+-----+---------+--------+ PTA     255               1.66 biphasic          +--------+------------------+-----+---------+--------+ +--------+------------------+-----+---------+-------+ Left    Lt Pressure  (mmHg)IndexWaveform Comment +--------+------------------+-----+---------+-------+ [id number]                    triphasic        +--------+------------------+-----+---------+-------+  Summary: Right: Resting right ankle-brachial index indicates noncompressible right lower extremity arteries.  *See table(s) above for measurements and observations.    Preliminary    . Chlorhexidine Gluconate Cloth  6 each Topical Daily  . doxycycline  100 mg Oral Q12H  . heparin  5,000 Units Subcutaneous Q8H  . hydrocerin   Topical BID  . insulin aspart  0-5 Units Subcutaneous QHS  . insulin aspart  0-9 Units Subcutaneous TID WC  . metoprolol tartrate  25 mg Oral BID  . sodium bicarbonate  650 mg Oral TID  . sodium chloride flush  3 mL Intravenous Once  . tamsulosin  0.4 mg Oral QHS    BMET    Component Value Date/Time   NA 140 12/03/2019 0531   K 4.3 12/03/2019 0531   CL 107 12/03/2019 0531   CO2 18 (L) 12/03/2019 0531   GLUCOSE 162 (H) 12/03/2019 0531   BUN 87 (H) 12/03/2019 0531   CREATININE 6.28 (H) 12/03/2019 0531   CALCIUM 7.5 (L) 12/03/2019 0531   GFRNONAA 9 (L) 12/03/2019 0531   GFRAA 10 (L) 12/03/2019 0531   CBC    Component Value Date/Time   WBC 8.6 12/03/2019 0531   RBC 2.83 (L) 12/03/2019 0531   HGB 8.3 (L) 12/03/2019 0531   HCT 26.7 (L) 12/03/2019 0531   HCT 25.5 (L) 11/29/2019 1103   PLT 183 12/03/2019 0531   MCV 94.3 12/03/2019 0531   MCH 29.3 12/03/2019 0531   MCHC 31.1 12/03/2019 0531   RDW 17.8 (H) 12/03/2019 0531   LYMPHSABS 0.7 11/28/2019 1330   MONOABS 0.5 11/28/2019 1330   EOSABS 0.5 11/28/2019 1330   BASOSABS 0.0 11/28/2019 1330    Assessment/Plan:  1. AKI vs. But more likely Subacute/progressive CKD due to poorly controlled DM and HTN for past 5 years as well as acute CHF.  BUN/CR rising with diuresis.  No urgent indication for dialysis at this time  and will continue to follow.  1. We did discuss RRT including HD, PD, and home HD.  Will have him  watch educational videos on dialysis. 2. Hold off on vascular access for now but if his BUN/Cr continue to climb will consult VVS tomorrow for possible AVF/AVG and TDC this week.  3. I stressed the importance of compliance moving forward.   2. Cardiomyopathy with EF of 30-35% and grade II diastolic dysfunction.  Markedly volume overloaded.  Likely with underlying CAD.  Given IV lasix.  Will need cardiology consultation and further workup/follow up.  3. Anemia of CKD- will check iron stores and will likely need to start ESA.  Will also check SPEP/UPEP. 4. HTN- likely has been longstanding and undiagnosed/treated with end organ damage (CKD, LVH, hypertensive heart disease).  Improved with medications.   Donetta Potts, MD [employer] [phone]

## 2019-12-03 NOTE — Progress Notes (Signed)
Dressing changed to right leg, wrapped in kerlix and ACE bandage. Patient tolerated well.

## 2019-12-03 NOTE — Consult Note (Addendum)
Admit date: 11/28/2019 Referring Physician  Gilles Chiquito, MD Primary Physician  None Primary Cardiologist  None (NEW) Reason for Consultation  CHF  HPI: [name] is a 64 y.o. male who is being seen today for the evaluation of CHF at the request of Gilles Chiquito, MD.  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  Developed SOB and on 4/29 Cxray showed bilateral effusions with possible PNA as well as diffuse interstitial opacities suspicious for pulmonary edema.  2D echo 4/30 showed moderate to severe LV dysfunction with EF 30-35% with global HK, moderate LVH, G2DD with elevated LVEDP, moderately reduced RVF with RVE, mild MR and elevated RAP of 67mmhg.    Nephrology has been following and renal function has continued to decline.  Bp has also been poorly controlled as high as 190/134mmHg.  Cardiology is now asked to evaluate for new onset CHF and optimization of CHF meds.  He denies any chest pain or pressure, PND, orthopnea, LE edema or palpitations.  Hbg has been as low as 7 and Creatinine 6.28.  Albumin also low at 2.2.    PMH:   Past Medical History:  Diagnosis Date  . Diabetes mellitus without complication (HCC)    PATIENT JUST LEARNED HE WAS DIABETIC  . Hepatitis   . Hypertension   . Hypokalemia   . Hyponatremia   . Necrotizing fasciitis ([place])   . Pneumonia    HX OF PNA     PSH:   Past Surgical History:  Procedure Laterality Date  . AMPUTATION Left 09/11/2013   Procedure: AMPUTATION BELOW KNEE;  Surgeon: Marianna Payment, MD;  Location: [place];  Service: Orthopedics;  Laterality: Left;  . AMPUTATION Right 12/18/2013   Procedure: RIGHT FOOT 1,2, TOE AMPUTATION  5th toe RAY AMPUTATION;  Surgeon: Marianna Payment, MD;  Location: [place];  Service: Orthopedics;  Laterality: Right;  . CHOLECYSTECTOMY    . I & D EXTREMITY Left  09/03/2013   Procedure: IRRIGATION AND DEBRIDEMENT EXTREMITY;  Surgeon: Marianna Payment, MD;  Location: [place];  Service: Orthopedics;  Laterality: Left;  . I & D EXTREMITY Left 09/11/2013   Procedure: LEFT FOOT IRRIGATION AND DEBRIDEMENT;  Surgeon: Marianna Payment, MD;  Location: [place];  Service: Orthopedics;  Laterality: Left;    Allergies:  Tramadol Prior to Admit Meds:   Medications Prior to Admission  Medication Sig Dispense Refill Last Dose  . HYDROcodone-acetaminophen (NORCO) 5-325 MG per tablet Take 1-2 tablets by mouth every 6 (six) hours as needed. (Patient not taking: Reported on 11/28/2019) 90 tablet 0 Completed Course at Unknown time  . Multiple Vitamin (MULTIVITAMIN WITH MINERALS) TABS tablet Take 1 tablet by mouth daily. (Patient not taking: Reported on 11/28/2019) 30 tablet 1 Not Taking at Unknown time   Fam HX:    Family History  Problem Relation Age of Onset  . Diabetes type II Mother   . Dementia Mother   . Heart disease Father    Social HX:    Social History   Socioeconomic History  . Marital status: Single    Spouse name: Not on file  . Number of children: Not on file  . Years of education: Not on file  . Highest education level: Not on file  Occupational History  . Not on file  Tobacco  Use  . Smoking status: Never Smoker  . Smokeless tobacco: Never Used  Substance and Sexual Activity  . Alcohol use: No  . Drug use: No  . Sexual activity: Not on file  Other Topics Concern  . Not on file  Social History Narrative  . Not on file   Social Determinants of Health   Financial Resource Strain:   . Difficulty of Paying Living Expenses:   Food Insecurity:   . Worried About Charity fundraiser in the Last Year:   . Arboriculturist in the Last Year:   Transportation Needs:   . Film/video editor (Medical):   Marland Kitchen Lack of Transportation (Non-Medical):   Physical Activity:   . Days of Exercise per Week:   . Minutes of Exercise per Session:   Stress:     . Feeling of Stress :   Social Connections:   . Frequency of Communication with Friends and Family:   . Frequency of Social Gatherings with Friends and Family:   . Attends Religious Services:   . Active Member of Clubs or Organizations:   . Attends Archivist Meetings:   Marland Kitchen Marital Status:   Intimate Partner Violence:   . Fear of Current or Ex-Partner:   . Emotionally Abused:   Marland Kitchen Physically Abused:   . Sexually Abused:      ROS:  All  ROS were addressed and are negative except what is stated in the HPI  Physical Exam: Blood pressure 139/77, pulse 81, temperature 98.3 F (36.8 C), temperature source Oral, resp. rate (!) 23, height 5\' 3"  (1.6 m), weight 73 kg, SpO2 95 %.    General: Well developed, well nourished, in no acute distress Head: Eyes PERRLA, No xanthomas.   Normal cephalic and atramatic  Lungs:   Clear bilaterally to auscultation and percussion. Heart:   HRRR S1 S2 Pulses are 2+ & equal.            No carotid bruit. No JVD.  No abdominal bruits. No femoral bruits. Abdomen: Bowel sounds are positive, abdomen soft and non-tender without masses or                  Hernia's noted. Msk:  Back normal, normal gait. Normal strength and tone for age. Extremities:   No clubbing, cyanosis or edema.  DP +1 Neuro: Alert and oriented X 3. Psych:  Good affect, responds appropriately    Labs:   Lab Results  Component Value Date   WBC 8.6 12/03/2019   HGB 8.3 (L) 12/03/2019   HCT 26.7 (L) 12/03/2019   MCV 94.3 12/03/2019   PLT 183 12/03/2019    Recent Labs  Lab 11/29/19 0426 11/30/19 0443 12/03/19 0531  NA 143   < > 140  K 4.9   < > 4.3  CL 114*   < > 107  CO2 17*   < > 18*  BUN 79*   < > 87*  CREATININE 5.19*   < > 6.28*  CALCIUM 8.3*   < > 7.5*  PROT 6.6  --   --   BILITOT 1.0  --   --   ALKPHOS 90  --   --   ALT 14  --   --   AST 16  --   --   GLUCOSE 138*   < > 162*   < > = values in this interval not displayed.   No results found for: PTT No  results found  for: INR, PROTIME No results found for: CKTOTAL, CKMB, CKMBINDEX, TROPONINI  No results found for: CHOL No results found for: HDL No results found for: LDLCALC No results found for: TRIG No results found for: CHOLHDL No results found for: LDLDIRECT    Radiology:  VAS [place] ABI WITH/WO TBI  Result Date: 12/03/2019 LOWER EXTREMITY DOPPLER STUDY Indications: Ulceration, and cellulitis RLE and left BKA. Other Factors: Right great toe amputation.  Comparison Study: no prior noted Performing Technologist: June Leap Rvt, Rdms  Examination Guidelines: A complete evaluation includes at minimum, Doppler waveform signals and systolic blood pressure reading at the level of bilateral brachial, anterior tibial, and posterior tibial arteries, when vessel segments are accessible. Bilateral testing is considered an integral part of a complete examination. Photoelectric Plethysmograph (PPG) waveforms and toe systolic pressure readings are included as required and additional duplex testing as needed. Limited examinations for reoccurring indications may be performed as noted.  ABI Findings: +--------+------------------+-----+---------+--------+ Right   Rt Pressure (mmHg)IndexWaveform Comment  +--------+------------------+-----+---------+--------+ [id number]                    triphasic         +--------+------------------+-----+---------+--------+ ATA     255               1.66 biphasic          +--------+------------------+-----+---------+--------+ PTA     255               1.66 biphasic          +--------+------------------+-----+---------+--------+ +--------+------------------+-----+---------+-------+ Left    Lt Pressure (mmHg)IndexWaveform Comment +--------+------------------+-----+---------+-------+ [id number]                    triphasic        +--------+------------------+-----+---------+-------+  Summary: Right: Resting right ankle-brachial index indicates  noncompressible right lower extremity arteries.  *See table(s) above for measurements and observations.  Electronically signed by [name] Mayo MD on 12/03/2019 at 1:07:29 PM.   Final      Telemetry    NSR - Personally Reviewed  ECG    No EKG to review - Personally Reviewed   ASSESSMENT/PLAN:   1.  Acute systolic CHF -this is a new diagnosis -etiology of biventricular dysfunction unknown -he is significantly anemic, has low albumin and worsening renal function, likely all playing a role in volume overload but unclear etiology of DCM> no recent URI and no hx of COVID infection -He has had poorly controlled HTN and also has risk factors for CAD -will need ischemic workup at some point but given his severe CKD, cath would likely lead to need for permanent HD so will start with Lexiscan myoview to rule out ischemia and if high risk then will need to discuss cath -Recommend renal manage patient's diuretics and volume management -not a candidate for ARB/ACE/Entresto/Spiro given CKD stage 5 -change metoprolol to Carvedilol 6.25mg  BID  -add Hydralazine 10mg  TID and Imdur 15mg  daily and titrate as BP allows -will get a PYP scan to rule out amyloid  2.  HTN -poorly controlled -change Lopressor to carvedilol 6.25mg  BID -add Hydralazine 10mg  TID  3.  CKD stage 5 -per nephrology -renal function continues to decline - may have some cardiorenal effects  4.  DM2 -per IM service  Fransico Him, MD  12/03/2019  5:41 PM

## 2019-12-03 NOTE — Progress Notes (Signed)
ABI       has been completed. Preliminary results can be found under CV proc through chart review. Kayti Poss, BS, RDMS, RVT   

## 2019-12-03 NOTE — Progress Notes (Signed)
Patient required 2L [place] overnight r/t oxygen desat while asleep.

## 2019-12-03 NOTE — [place] FL2 (Signed)
[place] MEDICAID FL2 LEVEL OF CARE SCREENING TOOL     IDENTIFICATION  Patient Name: Cuauhtemoc Huegel Birthdate: Jul 29, 1956 Sex: male Admission Date (Current Location): 11/28/2019  [facility] and [place] Number:  Herbalist and Address:  [facility] [place]. [facility], [place] [address], [place], [place] [address]      Provider Number: [id number]  Attending Physician Name and Address:  Sid Falcon, MD  Relative Name and Phone Number:  Hogan Hoobler (Daughter) [phone]    Current Level of Care: Hospital Recommended Level of Care: [place] Prior Approval Number:    Date Approved/Denied:   PASRR Number: [id number]  Discharge Plan: SNF    Current Diagnoses: Patient Active Problem List   Diagnosis Date Noted  . Pressure injury of skin 12/01/2019  . Anemia 11/29/2019  . Uncontrolled hypertension 11/29/2019  . CKD (chronic kidney disease), stage V ([place]) 11/29/2019  . Cellulitis of right lower extremity   . Severe protein-calorie malnutrition ([place])   . Diabetic polyneuropathy associated with type 2 diabetes mellitus ([place])   . Venous insufficiency of right lower extremity   . Shortness of breath   . Renal failure 11/28/2019  . Toe gangrene ([place]) 12/18/2013  . Gangrene of toe ([place]) 09/16/2013  . Fever with chills 09/16/2013  . Unilateral complete BKA ([place]) 09/13/2013  . DKA (diabetic ketoacidoses) ([place]) 09/04/2013  . Type 2 diabetes mellitus with diabetic foot infection ([place]) 09/02/2013  . DM (diabetes mellitus) type II controlled, neurological manifestation ([place]) 09/01/2013    Orientation RESPIRATION BLADDER Height & Weight     Self, Time, Situation, Place  O2(nasal cannula 2L/min) Continent(urethral cath) Weight: 160 lb 15 oz (73 kg) Height:  5\' 3"  (160 cm)  BEHAVIORAL SYMPTOMS/MOOD NEUROLOGICAL BOWEL NUTRITION STATUS      Continent Diet(see discharge summary)  AMBULATORY STATUS COMMUNICATION OF NEEDS Skin   Limited Assist Verbally  Other (Comment)(left BKA, cellulitis right foot, pressure injuru stage 2 sacrum)                       Personal Care Assistance Level of Assistance  Feeding, Dressing, Total care, Bathing Bathing Assistance: Limited assistance Feeding assistance: Independent Dressing Assistance: Limited assistance Total Care Assistance: Limited assistance   Functional Limitations Info  Sight, Hearing, Speech Sight Info: Adequate Hearing Info: Adequate Speech Info: Adequate    SPECIAL CARE FACTORS FREQUENCY  PT (By licensed PT), OT (By licensed OT)     PT Frequency: min 5x weekly OT Frequency: min 5x weekly            Contractures Contractures Info: Not present    Additional Factors Info  Code Status, Allergies Code Status Info: full Allergies Info: Tramadol           Current Medications (12/03/2019):  This is the current hospital active medication list Current Facility-Administered Medications  Medication Dose Route Frequency Provider Last Rate Last Admin  . Chlorhexidine Gluconate Cloth 2 % PADS 6 each  6 each Topical Daily Sid Falcon, MD   6 each at 12/03/19 [phone]  . doxycycline (VIBRA-TABS) tablet 100 mg  100 mg Oral Q12H Asencion Noble, MD   100 mg at 12/03/19 0834  . heparin injection 5,000 Units  5,000 Units Subcutaneous Q8H Kathi Ludwig, MD   5,000 Units at 12/03/19 1301  . hydrocerin (EUCERIN) cream   Topical BID Sid Falcon, MD   Given at 12/03/19 1300  . insulin aspart (novoLOG) injection 0-5 Units  0-5 Units Subcutaneous QHS Harbrecht, Lawrence, MD      . insulin aspart (novoLOG) injection 0-9 Units  0-9 Units Subcutaneous TID WC Kathi Ludwig, MD   2 Units at 12/03/19 1259  . metoprolol tartrate (LOPRESSOR) tablet 25 mg  25 mg Oral BID [name], MD   25 mg at 12/03/19 6580  . sodium bicarbonate tablet 650 mg  650 mg Oral TID Claudia Desanctis, MD   650 mg at 12/03/19 0837  . sodium chloride flush (NS) 0.9 % injection 3 mL  3 mL Intravenous  Once Drenda Freeze, MD      . tamsulosin [facility]) capsule 0.4 mg  0.4 mg Oral QHS Claudia Desanctis, MD   0.4 mg at 12/02/19 2152     Discharge Medications: Please see discharge summary for a list of discharge medications.  Relevant Imaging Results:  Relevant Lab Results:   Additional Information SSN: [id number]  Alberteen [name], LCSW

## 2019-12-03 NOTE — Progress Notes (Addendum)
[facility] Progress Note    Assessment/ Plan:   1. AKI vs. But more likely Subacute/progressive CKD due to poorly controlled DM and HTN for past 5 years as well as acute CHF. BUN/Cr still increasing, BUN 83>87, Cr 6.19>6.28.  No diuresis since 5/2.  UOP 1.1L, no UOP recorded from day shift (5/4).    2. Cardiomyopathy with EF of 30-35% and grade II diastolic dysfunction.  Markedly volume overloaded.  Likely with underlying CAD.  Given IV lasix, last dose on 5/2.  Recommend cardiology consultation for further workup and management. 3. Anemia of CKD- S/P 1u pRBC on 5/2.  Hgb stable at 8.3.  Ferritin elevated at 352, TIBC low at 232, iron 51.  SPEP, UPEP pending.  Suspect will need ESA. 4. HTN- likely has been longstanding and undiagnosed/treated with end organ damage (CKD, LVH, hypertensive heart disease).  Improved with medications. On lopressor 25 BID. 5. Cellulitis of RLE - s/p 3d vanc/CTX/flagyl, on 5/2 transitioned to doxycycline.  Continuing x 10 days, per primary team. Patient had ABIs performed today 5/4, "Resting right ankle-brachial index indicates noncompressible right lower extremity arteries."   Subjective:   Patient seen resting in bed, states he has a little bit of a cough that occasionally produces clear fluid. He inquired if there is anything he can do to get his kidneys working better.    Objective:   BP (!) 144/92 (BP Location: Left Arm)   Pulse 84   Temp 98.1 F (36.7 C) (Oral)   Resp 19   Ht 5\' 3"  (1.6 m)   Wt 73 kg   SpO2 99%   BMI 28.51 kg/m   Intake/Output Summary (Last 24 hours) at 12/03/2019 1204 Last data filed at 12/03/2019 0241 Gross per 24 hour  Intake 360 ml  Output 1100 ml  Net -740 ml   Weight change:   Physical Exam: Gen: pleasant, seen resting in bed, NAD, nontoxic appearing, pallor appreciated CVS:RRR S1S2 present, no murmus Resp:CTA bilaterally, comfortable work of breathing, speaking in complete sentences, no coughing  appreciated Abd: Soft, normal bowel sounds, nontender to palpation Ext: Left BKA, Right lower extremity 2+ pitting edema, s/p R 1-3 toes amputated, lower extremity erythema and skin flaking, no obvious signs of infection  Imaging: VAS [place] ABI WITH/WO TBI  Result Date: 12/03/2019 LOWER EXTREMITY DOPPLER STUDY Indications: Ulceration, and cellulitis RLE and left BKA. Other Factors: Right great toe amputation.  Comparison Study: no prior noted Performing Technologist: June Leap Rvt, Rdms  Examination Guidelines: A complete evaluation includes at minimum, Doppler waveform signals and systolic blood pressure reading at the level of bilateral brachial, anterior tibial, and posterior tibial arteries, when vessel segments are accessible. Bilateral testing is considered an integral part of a complete examination. Photoelectric Plethysmograph (PPG) waveforms and toe systolic pressure readings are included as required and additional duplex testing as needed. Limited examinations for reoccurring indications may be performed as noted.  ABI Findings: +--------+------------------+-----+---------+--------+ Right   Rt Pressure (mmHg)IndexWaveform Comment  +--------+------------------+-----+---------+--------+ [id number]                    triphasic         +--------+------------------+-----+---------+--------+ ATA     255               1.66 biphasic          +--------+------------------+-----+---------+--------+ PTA     255               1.66 biphasic          +--------+------------------+-----+---------+--------+ +--------+------------------+-----+---------+-------+  Left    Lt Pressure (mmHg)IndexWaveform Comment +--------+------------------+-----+---------+-------+ [id number]                    triphasic        +--------+------------------+-----+---------+-------+  Summary: Right: Resting right ankle-brachial index indicates noncompressible right lower extremity arteries.  *See  table(s) above for measurements and observations.    Preliminary     Labs: BMET Recent Labs  Lab 11/28/19 1330 11/28/19 1804 11/29/19 0426 11/30/19 0443 12/01/19 0616 12/02/19 0435 12/03/19 0531  NA 143 144 143 145 143 141 140  K 5.4* 5.0 4.9 4.7 4.6 4.5 4.3  CL 112* 114* 114* 116* 111 112* 107  CO2 17*  --  17* 18* 19* 20* 18*  GLUCOSE 119* 90 138* 93 124* 131* 162*  BUN 78* 82* 79* 76* 79* 83* 87*  CREATININE 5.04* 5.10* 5.19* 5.12* 5.45* 6.19* 6.28*  CALCIUM 8.7*  --  8.3* 8.3* 7.9* 7.4* 7.5*  PHOS  --   --   --   --   --   --  5.7*   CBC Recent Labs  Lab 11/28/19 1330 11/28/19 1804 11/30/19 0443 12/01/19 0616 12/02/19 0435 12/03/19 0531  WBC 8.9   < > 7.4 7.7 8.0 8.6  NEUTROABS 7.1  --   --   --   --   --   HGB 8.5*   < > 7.5* 7.0* 8.5* 8.3*  HCT 29.0*   < > 24.6* 22.9* 26.8* 26.7*  MCV 101.0*   < > 98.0 96.2 93.7 94.3  PLT 214   < > 194 190 173 183   < > = values in this interval not displayed.    Medications:    . Chlorhexidine Gluconate Cloth  6 each Topical Daily  . doxycycline  100 mg Oral Q12H  . heparin  5,000 Units Subcutaneous Q8H  . hydrocerin   Topical BID  . insulin aspart  0-5 Units Subcutaneous QHS  . insulin aspart  0-9 Units Subcutaneous TID WC  . metoprolol tartrate  25 mg Oral BID  . sodium bicarbonate  650 mg Oral TID  . sodium chloride flush  3 mL Intravenous Once  . tamsulosin  0.4 mg Oral QHS     [place] Constable, DO Milus Banister, DO  [facility] Resident, PGY 2 12/03/2019, 12:04 PM   I have seen and examined this patient and agree with plan and assessment in the above note with renal recommendations/intervention highlighted.  See my progress note.  Governor Rooks Vermelle Cammarata,MD 12/03/2019 3:42 PM

## 2019-12-03 NOTE — Progress Notes (Signed)
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  Subjective/Interm history  No overnight events  Significant Hospital Events   4/29 hospital admission 4/30 ortho and neph consulted. Sodium bicarb started 5/1 40mg  lasix>1.6L out. Lopressor started. 5/2 transfused 1U PRBC for hgb of 7. Vanc/rocephin/flagyl stopped>transitioned to doxy. flomax started for urinary retention.  5/3 hgb 8.5. renal function continuing to decline 5/4 cr con  Objective   Blood pressure (!) 146/82, pulse 98, temperature 97.9 F (36.6 C), temperature source Oral, resp. rate 19, height 5\' 3"  (1.6 m), weight 72.2 kg, SpO2 94 %.     Intake/Output Summary (Last 24 hours) at 12/03/2019 0509 Last data filed at 12/03/2019 0211 Gross per 24 hour  Intake 480 ml  Output 1850 ml  Net -1370 ml   Filed Weights   11/28/19 2000 11/30/19 0637 12/01/19 0338  Weight: 73.4 kg 69.9 kg 72.2 kg    Examination: GENERAL: in no acute distress CARDIAC: heart RRR. No peripheral edema.  PULMONARY: breathing comfortably on room air. Lung sounds diminished at the bases SKIN: erythema/edema with scabs to right foot. Compression wrapping in place.  Consults:  Orthopedics-signed off Nephology  Cardiology Significant Diagnostic Tests:  4/29 R foot MRI>>no evidence of loculated fluid collection or osteomyelitis  4/29 CXR>>b/l effusions with basilar atelectasis vs pna; vascular congestion with diffuse interstitial opacities suspicious for pulm edema  4/30 CT renal study>>no acute abnormalities in abd/pelvis.  4/30 echocardiogram>>Left ventricular ejection fraction, by estimation, is 30 to 35%. The left ventricle has moderately decreased function. The left ventricle demonstrates  global hypokinesis. Diffuse hypokinesis worse in the inferior base .  The left ventricular internal cavity size was mildly dilated. There is moderate left ventricular hypertrophy. Left ventricular diastolic parameters are consistent with Grade II diastolic dysfunction (pseudonormalization). Elevated left ventricular end-diastolic pressure. Right ventricular systolic function is moderately reduced. The right ventricular size is mildly enlarged. The mitral valve is normal in structure. Mild mitral valve egurgitation. No evidence of mitral stenosis. The aortic valve is tricuspid. Aortic valve regurgitation is not  visualized. Mild aortic valve sclerosis is present, with no evidence of aortic valve stenosis. The inferior vena cava is dilated in size with <50% respiratory  variability, suggesting right atrial pressure of 15 mmHg.   Micro Data:  4/29 blood cultures>>NGTD 5/2 urine cultures>>NGTD  Antimicrobials:  Vancomycin 4/29>>5/2 Rocephin 4/29>>5/2 Flagyl 4/29>>5/2 Doxycycline 5/2>>  Labs    CBC Latest Ref Rng & Units 12/02/2019 12/01/2019 11/30/2019  WBC 4.0 - 10.5 K/uL 8.0 7.7 7.4  Hemoglobin 13.0 - 17.0 g/dL 8.5(L) 7.0(L) 7.5(L)  Hematocrit 39.0 - 52.0 % 26.8(L) 22.9(L) 24.6(L)  Platelets 150 - 400 K/uL 173 190 194   BMP Latest Ref Rng & Units 12/02/2019 12/01/2019 11/30/2019  Glucose 70 - 99 mg/dL 131(H) 124(H) 93  BUN 8 - 23 mg/dL 83(H) 79(H) 76(H)  Creatinine 0.61 - 1.24 mg/dL 6.19(H) 5.45(H) 5.12(H)  Sodium 135 - 145 mmol/L 141 143 145  Potassium 3.5 - 5.1 mmol/L 4.5 4.6 4.7  Chloride 98 - 111 mmol/L 112(H) 111 116(H)  CO2 22 - 32 mmol/L 20(L) 19(L) 18(L)  Calcium 8.9 - 10.3 mg/dL 7.4(L) 7.9(L) 8.3(L)    Summary  64 yo male with T2DM  and HTN who was admitted to IMTS on 4/29 for purulent cellulitis of RLE.  Resolved/Stable hospital problems  Bacturia-received 3d course of vanc/rocephin/flagyl. No growth on urine culture Urinary retention-flomax started 5/2 Acute hypoxic respiratory  failure due to pulmonary edema and bilateral pleural effusions likely due to decompensated combined heart failure vs CKD 5 Assessment & Plan:  Principal Problem:   Type 2 diabetes mellitus with diabetic foot infection ([place]) Active Problems:   Renal failure   Anemia   Uncontrolled hypertension   CKD (chronic kidney disease), stage V (HCC)   Shortness of breath   Pressure injury of skin  Nonpurulent cellulitis of the RLE in the setting of venous/lymphatic insufficiency and T2DM Assessment: remains afebrile without leukocytosis.  Plan --continue doxycycline--day #5/10 --Continue compression wrap  CKD V with metabolic acidosis. Likely due to poorly controlled DM and HTN along with CHF -renal function continuing to decline with diuresis. Last lasix dose 5/2 -no urgent indication for HD at this time however nephrology planning on continuing to monitor renal function--if it continues to decline, will likely need tunneled HD line placed. If it plateaus, will probably consider getting fistula/graft -neph checking PTH, Vit D. Plan Sodium bicarb 650mg  tid  Continue flomax for urinary retention Strict I/O  Newly diagnosed Combined heart failure. Echocardiogram with EF 30-35% with global LV hypokinesis and grade II diastolic dysfunction. Renal function precluding spiro/ACEI/ARB initiation Uncontrolled hypertension. Blood pressure on admission 190/109. Lopressor started 5/1 Assessment: now normotensive. Does not appear hypervolemic on exam -5/3 I/Os: 1L UOP. Total Net +1.6L. Weight -1.2kg Plan -increase metoprolol to 50mg  bid -will get cardiology on board as well for heart failure optimization. Will likely need ischemic workup outpatient -Continuous tele monitoring, strict I/O, daily weights, fluid restriction  Normocytic anemia. Likely CKD related. -suspect will need to start ESA Plan: daily cbc. Transfuse for hgb <7.  Borderline gamma gap--4. SPEP/UPEP normal  T2DM. Sugars stable.  Continue SSI  Goals of care. Would likely benefit from outpatient palliative visit given his severe and numerous comorbidities.  Balance issues/deconditioning. Likely 2/2 right foot cellulitis. PT recommending SNF at this time.  Best practice:  CODE STATUS: full Diet: renal, cardiac, fl restriction DVT for prophylaxis: heparin Social considerations/Family communication: TOC to assist with insurance Dispo: discharge home pending continued renal monitoring and need for HD per neph. Ortho follow up at discharge. Sleep study at discharge. PT recommending SNF at this time   [name], MD [place] PGY-1 PAGER #: [phone] 12/03/19  5:09 AM

## 2019-12-04 ENCOUNTER — Inpatient Hospital Stay (HOSPITAL_COMMUNITY): Payer: Medicare Other

## 2019-12-04 DIAGNOSIS — I420 Dilated cardiomyopathy: Secondary | ICD-10-CM

## 2019-12-04 DIAGNOSIS — N185 Chronic kidney disease, stage 5: Secondary | ICD-10-CM | POA: Diagnosis not present

## 2019-12-04 DIAGNOSIS — I5021 Acute systolic (congestive) heart failure: Secondary | ICD-10-CM | POA: Diagnosis not present

## 2019-12-04 DIAGNOSIS — I10 Essential (primary) hypertension: Secondary | ICD-10-CM | POA: Diagnosis not present

## 2019-12-04 LAB — RENAL FUNCTION PANEL
Albumin: 2.1 g/dL — ABNORMAL LOW (ref 3.5–5.0)
Anion gap: 12 (ref 5–15)
BUN: 85 mg/dL — ABNORMAL HIGH (ref 8–23)
CO2: 19 mmol/L — ABNORMAL LOW (ref 22–32)
Calcium: 7.5 mg/dL — ABNORMAL LOW (ref 8.9–10.3)
Chloride: 109 mmol/L (ref 98–111)
Creatinine, Ser: 6.43 mg/dL — ABNORMAL HIGH (ref 0.61–1.24)
GFR calc Af Amer: 10 mL/min — ABNORMAL LOW (ref >60)
GFR calc non Af Amer: 9 mL/min — ABNORMAL LOW (ref >60)
Glucose, Bld: 130 mg/dL — ABNORMAL HIGH (ref 70–99)
Phosphorus: 5.8 mg/dL — ABNORMAL HIGH (ref 2.5–4.6)
Potassium: 3.9 mmol/L (ref 3.5–5.1)
Sodium: 140 mmol/L (ref 135–145)

## 2019-12-04 LAB — CBC
HCT: 25.3 % — ABNORMAL LOW (ref 39.0–52.0)
Hemoglobin: 8 g/dL — ABNORMAL LOW (ref 13.0–17.0)
MCH: 29.7 pg (ref 26.0–34.0)
MCHC: 31.6 g/dL (ref 30.0–36.0)
MCV: 94.1 fL (ref 80.0–100.0)
Platelets: 176 10*3/uL (ref 150–400)
RBC: 2.69 MIL/uL — ABNORMAL LOW (ref 4.22–5.81)
RDW: 17.5 % — ABNORMAL HIGH (ref 11.5–15.5)
WBC: 8 10*3/uL (ref 4.0–10.5)
nRBC: 0.2 % (ref 0.0–0.2)

## 2019-12-04 LAB — GLUCOSE, CAPILLARY
Glucose-Capillary: 120 mg/dL — ABNORMAL HIGH (ref 70–99)
Glucose-Capillary: 126 mg/dL — ABNORMAL HIGH (ref 70–99)
Glucose-Capillary: 95 mg/dL (ref 70–99)
Glucose-Capillary: 177 mg/dL — ABNORMAL HIGH (ref 70–99)

## 2019-12-04 LAB — PARATHYROID HORMONE, INTACT (NO CA): PTH: 189 pg/mL — ABNORMAL HIGH (ref 15–65)

## 2019-12-04 MED ORDER — TECHNETIUM TC 99M PYROPHOSPHATE
21.2000 | Freq: Once | INTRAVENOUS | Status: AC | PRN
  Administered 2019-12-04: 21.2 via INTRAVENOUS
  Filled 2019-12-04: qty 22

## 2019-12-04 MED ORDER — CHLORHEXIDINE GLUCONATE CLOTH 2 % EX PADS
6.0000 | MEDICATED_PAD | Freq: Every day | CUTANEOUS | Status: DC
  Administered 2019-12-04 – 2019-12-12 (×7): 6 via TOPICAL

## 2019-12-04 NOTE — Progress Notes (Signed)
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  Subjective/Interm history  No overnight events. No complaints this morning  Significant Hospital Events   4/29 hospital admission 4/30 ortho and neph consulted. Sodium bicarb started 5/1 40mg  lasix>1.6L out. Lopressor started. 5/2 transfused 1U PRBC for hgb of 7. Vanc/rocephin/flagyl stopped>transitioned to doxy. flomax started for urinary retention.  5/3 hgb 8.5. renal function continuing to decline 5/4 cr continuing to rise, cardiology consulted--hydral/imdur started, metop>coreg 5/5 no significant changes  Objective   Blood pressure (!) 148/80, pulse 88, temperature 98 F (36.7 C), temperature source Oral, resp. rate (!) 24, height 5\' 3"  (1.6 m), weight 73 kg, SpO2 99 %.     Intake/Output Summary (Last 24 hours) at 12/04/2019 0534 Last data filed at 12/04/2019 0523 Gross per 24 hour  Intake 720 ml  Output 850 ml  Net -130 ml   Filed Weights   11/30/19 0637 12/01/19 0338 12/03/19 0519  Weight: 69.9 kg 72.2 kg 73 kg    Examination: GENERAL: in no acute distress CARDIAC: heart RRR. No peripheral edema.  PULMONARY: breathing comfortably on room air. Lung sounds diminished at the bases SKIN: erythema/edema with scabs to right foot. Compression wrapping in place.  Consults:  Orthopedics-signed off Nephology  Cardiology Significant Diagnostic Tests:  4/29 R foot MRI>>no evidence of loculated fluid collection or osteomyelitis  4/29 CXR>>b/l effusions with basilar atelectasis vs pna; vascular congestion with diffuse interstitial opacities suspicious for pulm edema  4/30 CT renal study>>no acute abnormalities in abd/pelvis.  4/30 echocardiogram>>Left ventricular ejection  fraction, by estimation, is 30 to 35%. The left ventricle has moderately decreased function. The left ventricle demonstrates global hypokinesis. Diffuse hypokinesis worse in the inferior base .  The left ventricular internal cavity size was mildly dilated. There is moderate left ventricular hypertrophy. Left ventricular diastolic parameters are consistent with Grade II diastolic dysfunction (pseudonormalization). Elevated left ventricular end-diastolic pressure. Right ventricular systolic function is moderately reduced. The right ventricular size is mildly enlarged. The mitral valve is normal in structure. Mild mitral valve egurgitation. No evidence of mitral stenosis. The aortic valve is tricuspid. Aortic valve regurgitation is not  visualized. Mild aortic valve sclerosis is present, with no evidence of aortic valve stenosis. The inferior vena cava is dilated in size with <50% respiratory  variability, suggesting right atrial pressure of 15 mmHg.   Micro Data:  4/29 blood cultures>>NG 5/2 urine cultures>>NG  Antimicrobials:  Vancomycin 4/29>>5/2 Rocephin 4/29>>5/2 Flagyl 4/29>>5/2 Doxycycline 5/2>>  Summary  64 yo male with T2DM and HTN who was admitted to IMTS on 4/29 for purulent cellulitis of RLE.  Resolved/Stable hospital problems  Bacturia-received 3d course of vanc/rocephin/flagyl. No growth on urine culture Urinary retention-flomax started 5/2 Acute hypoxic respiratory failure due to pulmonary edema and bilateral pleural effusions likely due to decompensated combined heart failure vs CKD 5 Borderline gamma gap--4. SPEP/UPEP normal Assessment & Plan:  Principal Problem:   Type 2 diabetes mellitus with diabetic foot infection ([place]) Active Problems:   Renal failure   Anemia   Uncontrolled hypertension   CKD (chronic kidney disease), stage V (HCC)   Shortness of breath   Pressure injury of skin  Nonpurulent cellulitis of the RLE in the setting of  venous/lymphatic insufficiency  and T2DM Assessment: remains afebrile without leukocytosis.  Plan --continue doxycycline--day #6/10 --Continue compression wrap  CKD V with metabolic acidosis. Likely due to poorly controlled DM and HTN along with CHF -renal function a little worse today but might be starting to be getting close to leveling off. -no urgent indication for HD at this time. Nephrology to determine timing of HD access placement Plan Management per nephrology Sodium bicarb 650mg  tid  Remove foley. Bladder scan 4h post removal Continue flomax for urinary retention Strict I/O  Newly diagnosed Combined heart failure.  EF 30-35% with global LV hypokinesis and G2DD.  Renal function precluding ACEI/ARB, entresto, spiro Cardiology consulted: will need ischemic workup at some point however will hold off on cath due to renal function and will start with lexiscan.  Hydral, imdur started 5/4. Metoprolol exchanged for Carvedilol  Uncontrolled hypertension. Blood pressures stable -5/4 I/Os: 0.8L UOP. Total Net +1.5L.  Plan -management per cardiology. Diuresis to be guided by nephrology -continue hydral, imdur, coreg. uptitrate as BP/HR tolerates -obtaining lexiscan  -Continuous tele monitoring, strict I/O, daily weights, fluid restriction  Normocytic anemia. Likely CKD related. -ESA initiation decision to be guided by nephrology -SPEP/UPEP normal Plan: daily cbc. Transfuse for hgb <7.  T2DM. Sugars stable. Continue SSI  Goals of care. Would likely benefit from outpatient palliative visit given his severe and numerous comorbidities.  Balance issues/deconditioning. Likely 2/2 right foot cellulitis. PT recommending SNF at this time.  Best practice:  CODE STATUS: full Diet: renal, cardiac, fl restriction DVT for prophylaxis: heparin Social considerations/Family communication: TOC to assist with insurance Dispo: discharge home pending continued renal monitoring and need for HD per neph. Ortho follow up at  discharge. Sleep study at discharge. PT recommending SNF at this time   [name], MD [place] PGY-1 PAGER #: [phone] 12/04/19  5:34 AM  Labs    CBC Latest Ref Rng & Units 12/04/2019 12/03/2019 12/02/2019  WBC 4.0 - 10.5 K/uL 8.0 8.6 8.0  Hemoglobin 13.0 - 17.0 g/dL 8.0(L) 8.3(L) 8.5(L)  Hematocrit 39.0 - 52.0 % 25.3(L) 26.7(L) 26.8(L)  Platelets 150 - 400 K/uL 176 183 173   BMP Latest Ref Rng & Units 12/04/2019 12/03/2019 12/02/2019  Glucose 70 - 99 mg/dL 130(H) 162(H) 131(H)  BUN 8 - 23 mg/dL 85(H) 87(H) 83(H)  Creatinine 0.61 - 1.24 mg/dL 6.43(H) 6.28(H) 6.19(H)  Sodium 135 - 145 mmol/L 140 140 141  Potassium 3.5 - 5.1 mmol/L 3.9 4.3 4.5  Chloride 98 - 111 mmol/L 109 107 112(H)  CO2 22 - 32 mmol/L 19(L) 18(L) 20(L)  Calcium 8.9 - 10.3 mg/dL 7.5(L) 7.5(L) 7.4(L)

## 2019-12-04 NOTE — Progress Notes (Signed)
Foley removed per MD order.  

## 2019-12-04 NOTE — Hospital Course (Signed)
13 yom that has not seen a physician for 5y presenting for SOB  4/29 hosp admission. CXR with severe pulm edema. Labs indicate severe renal disease. Also has RLE cellulitis>vanc, cefepime, flagyl 4/30> ortho consulted-not surgical need right now>outpt follow up. Neph consulted>bicarb po started. Creatinine continues to decline 5/1>lopressor started. Renal function continues to decline 5/2> hgb 7>transfused one use. Abx transitioned to doxy. Flomax started for rentention. Renal function continues to decline. On room air 5/3>hgb stable. Renal function continues to decline 5/4>cards consulted. Hydral/imdur started. Metop switched to coreg. 5/5>talking about possible vasc surg consult for graft tomorrow

## 2019-12-04 NOTE — Progress Notes (Addendum)
PT Cancellation Note  Patient Details Name: [name] MRN: [id number] DOB: 09-17-1955   Cancelled Treatment:    Reason Eval/Treat Not Completed: Patient at procedure or test/unavailable  Per secure message with nurse, patient off the floor at a stress test, PT to attempt at a later time/date, schedule permitting.  Pt still not in room around 3:30PM. PT to attempt another day.   Birdie Hopes 12/04/2019, 2:50 PM

## 2019-12-04 NOTE — Progress Notes (Addendum)
Progress Note  Patient Name: [name] Date of Encounter: 12/04/2019  Primary Cardiologist: No primary care provider on file.   Subjective   Denies any chest pain.  Breathing has improved with diuresis.  Inpatient Medications    Scheduled Meds: . carvedilol  12.5 mg Oral BID WC  . Chlorhexidine Gluconate Cloth  6 each Topical Daily  . doxycycline  100 mg Oral Q12H  . heparin  5,000 Units Subcutaneous Q8H  . hydrALAZINE  10 mg Oral Q8H  . hydrocerin   Topical BID  . insulin aspart  0-5 Units Subcutaneous QHS  . insulin aspart  0-9 Units Subcutaneous TID WC  . isosorbide mononitrate  15 mg Oral Daily  . sodium bicarbonate  650 mg Oral TID  . sodium chloride flush  3 mL Intravenous Once  . tamsulosin  0.4 mg Oral QHS   Continuous Infusions:  PRN Meds:    Vital Signs    Vitals:   12/03/19 2237 12/04/19 0518 12/04/19 0608 12/04/19 0625  BP: (!) 144/76 (!) 148/80  (!) 152/82  Pulse:  88 90   Resp:  (!) 24 (!) 23   Temp:  98 F (36.7 C)    TempSrc:  Oral    SpO2:  99% 99%   Weight:   73.1 kg   Height:        Intake/Output Summary (Last 24 hours) at 12/04/2019 0747 Last data filed at 12/04/2019 0523 Gross per 24 hour  Intake 720 ml  Output 850 ml  Net -130 ml   Filed Weights   12/01/19 0338 12/03/19 0519 12/04/19 1610  Weight: 72.2 kg 73 kg 73.1 kg    Telemetry    NSR - Personally Reviewed  ECG    No new EKG to review - Personally Reviewed  Physical Exam   GEN: No acute distress.   Neck: No JVD Cardiac: RRR, no murmurs, rubs, or gallops.  Respiratory: Crackles at bases GI: Soft, nontender, non-distended  MS: 1+ RLE edema Neuro:  Nonfocal  Psych: Normal affect   Labs    Chemistry Recent Labs  Lab 11/28/19 1330 11/28/19 1804 11/29/19 0426 11/30/19 0443 12/02/19 0435 12/03/19 0531 12/04/19 0237  NA 143   < > 143   < > 141 140 140  K 5.4*   < > 4.9   < > 4.5 4.3 3.9  CL 112*   < > 114*   < > 112* 107 109  CO2 17*  --  17*   < > 20* 18*  19*  GLUCOSE 119*   < > 138*   < > 131* 162* 130*  BUN 78*   < > 79*   < > 83* 87* 85*  CREATININE 5.04*   < > 5.19*   < > 6.19* 6.28* 6.43*  CALCIUM 8.7*  --  8.3*   < > 7.4* 7.5* 7.5*  PROT 6.9  --  6.6  --   --   --   --   ALBUMIN 2.8*  --  2.6*  --   --  2.2* 2.1*  AST 15  --  16  --   --   --   --   ALT 14  --  14  --   --   --   --   ALKPHOS 100  --  90  --   --   --   --   BILITOT 1.0  --  1.0  --   --   --   --  GFRNONAA 11*  --  11*   < > 9* 9* 9*  GFRAA 13*  --  13*   < > 10* 10* 10*  ANIONGAP 14  --  12   < > 9 15 12    < > = values in this interval not displayed.     Hematology Recent Labs  Lab 12/02/19 0435 12/03/19 0531 12/04/19 0237  WBC 8.0 8.6 8.0  RBC 2.86* 2.83* 2.69*  HGB 8.5* 8.3* 8.0*  HCT 26.8* 26.7* 25.3*  MCV 93.7 94.3 94.1  MCH 29.7 29.3 29.7  MCHC 31.7 31.1 31.6  RDW 18.2* 17.8* 17.5*  PLT 173 183 176    Cardiac EnzymesNo results for input(s): TROPONINI in the last 168 hours. No results for input(s): TROPIPOC in the last 168 hours.   BNPNo results for input(s): BNP, PROBNP in the last 168 hours.   DDimer No results for input(s): DDIMER in the last 168 hours.   Radiology    VAS [place] ABI WITH/WO TBI  Result Date: 12/03/2019 LOWER EXTREMITY DOPPLER STUDY Indications: Ulceration, and cellulitis RLE and left BKA. Other Factors: Right great toe amputation.  Comparison Study: no prior noted Performing Technologist: June Leap Rvt, Rdms  Examination Guidelines: A complete evaluation includes at minimum, Doppler waveform signals and systolic blood pressure reading at the level of bilateral brachial, anterior tibial, and posterior tibial arteries, when vessel segments are accessible. Bilateral testing is considered an integral part of a complete examination. Photoelectric Plethysmograph (PPG) waveforms and toe systolic pressure readings are included as required and additional duplex testing as needed. Limited examinations for reoccurring indications may be  performed as noted.  ABI Findings: +--------+------------------+-----+---------+--------+ Right   Rt Pressure (mmHg)IndexWaveform Comment  +--------+------------------+-----+---------+--------+ [id number]                    triphasic         +--------+------------------+-----+---------+--------+ ATA     255               1.66 biphasic          +--------+------------------+-----+---------+--------+ PTA     255               1.66 biphasic          +--------+------------------+-----+---------+--------+ +--------+------------------+-----+---------+-------+ Left    Lt Pressure (mmHg)IndexWaveform Comment +--------+------------------+-----+---------+-------+ [id number]                    triphasic        +--------+------------------+-----+---------+-------+  Summary: Right: Resting right ankle-brachial index indicates noncompressible right lower extremity arteries.  *See table(s) above for measurements and observations.  Electronically signed by [name] Mayo MD on 12/03/2019 at 1:07:29 PM.   Final     Cardiac Studies   2D echo IMPRESSIONS   1. Large left pleural effusion with atelectatic lung tissue seen.  2. Diffuse hypokinesis worse in the inferior base . Left ventricular  ejection fraction, by estimation, is 30 to 35%. The left ventricle has  moderately decreased function. The left ventricle demonstrates global  hypokinesis. The left ventricular internal  cavity size was mildly dilated. There is moderate left ventricular  hypertrophy. Left ventricular diastolic parameters are consistent with  Grade II diastolic dysfunction (pseudonormalization). Elevated left  ventricular end-diastolic pressure.  3. Right ventricular systolic function is moderately reduced. The right  ventricular size is mildly enlarged.  4. The mitral valve is normal in structure. Mild mitral valve  regurgitation. No evidence of mitral stenosis.  5. The aortic valve  is tricuspid.  Aortic valve regurgitation is not  visualized. Mild aortic valve sclerosis is present, with no evidence of  aortic valve stenosis.  6. The inferior vena cava is dilated in size with <50% respiratory  variability, suggesting right atrial pressure of 15 mmHg.   Patient Profile     64 y.o. 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  Assessment & Plan    1.  Acute systolic CHF -this is a new diagnosis -etiology of biventricular dysfunction unknown -he is significantly anemic, has low albumin and worsening renal function, likely all playing a role in volume overload but unclear etiology of DCM> no recent URI and no hx of COVID infection -He has had poorly controlled HTN and also has risk factors for CAD -will need ischemic workup at some point but given his severe CKD, cath would likely lead to need for permanent HD so will start with Lexiscan myoview to rule out ischemia and if high risk then will need to discuss cath -he put out 850cc yesterday and is still net + 1.5L -remains volume overloaded on exam -Recommend renal manage patient's diuretics and volume management given CKD stage 5 -not a candidate for ARB/ACE/Entresto/Spiro given CKD stage 5 -continue Carvedilol 6.25mg  BID, Hydralazine 10mg  TID and Imdur 15mg  daily and titrate as BP allows -Would like HR<80 and BP so will consider uptitration of Carvedilol to 12.5mg  BID if HR remains elevated -will get a PYP scan to rule out amyloid today and then proceed with nuclear stress test -avoid cath or coronary CTA at this time due to CKD  2.  HTN -BP borderline controlled -continue Hydralazine 10mg  TID, Carvedilol and Imdur  3.  CKD stage 5 -per nephrology -renal function continues to decline - may have some cardiorenal effects  4.  DM2 -per IM service  I have spent a total of 35 minutes with  patient reviewing hospital notes , telemetry, EKGs, labs and examining patient as well as establishing an assessment and plan that was discussed with the patient.  > 50% of time was spent in direct patient care.       For questions or updates, please contact [place] Please consult [email or web address] for contact info under Cardiology/STEMI.      Signed, Fransico Him, MD  12/04/2019, 7:47 AM

## 2019-12-04 NOTE — Progress Notes (Signed)
OT Cancellation Note  Patient Details Name: [name] MRN: [id number] DOB: March 11, 1956   Cancelled Treatment:    Reason Eval/Treat Not Completed: Patient at procedure or test/ unavailable; pt off floor for stress test. Will follow up for OT eval as able.  Lou Cal, OT Acute Rehabilitation Services Pager [phone] Office [phone]   Raymondo Band 12/04/2019, 2:51 PM

## 2019-12-04 NOTE — Progress Notes (Signed)
   12/04/19 1704  Urine Characteristics  Bladder Scan Volume (mL) 410 mL   Dr. Sheppard Coil notified Verbal order to place foley.

## 2019-12-04 NOTE — Progress Notes (Addendum)
[facility] Progress Note    Assessment/ Plan:   1. AKIvs. But more likely Subacute/progressive CKD due to poorly controlled DM and HTN for past 5 years as well as acute CHF.   BUN 87>85, Cr 6.28>6.43.  UOP on 5/4 recorded 850 cc.  Had discussion 5/4 regarding RRT including HD, PD, home HD. 1. Likely consult VVS tomorrow if Scr continues to climb for possible AVF/AVG +/-TDC this week 2. Cardiomyopathy with EF of 30-35% and grade II diastolic dysfunction. Markedly volume overloaded.  Cardiology consulted on 5/4, change metoprolol to Coreg, added hydralazine and Imdur.  Planning for [facility] given worsening renal function. 3. Anemia of CKD- S/P 1u pRBC on 5/2.  Hgb stable at 8.3.  Ferritin elevated at 352, TIBC low at 232, iron 51.  SPEP, UPEP negative, only suggestive of hypoalbuminemia.    Will likely need to start ESA. 4. HTN- likely has been longstanding and undiagnosed/treated with end organ damage (CKD, LVH, hypertensive heart disease). Improved with medications.   Now on Coreg, Hydrea, Imdur per cardiology. 5. Cellulitis of RLE - s/p 3d vanc/CTX/flagyl, on 5/2 transitioned to doxycycline.  Continuing x 10 days, per primary team. Patient had ABIs performed today 5/4, "Resting right ankle-brachial index indicates noncompressible right lower extremity arteries." 6. Suspect secondary hyperparathyroidism -calcium corrects to 9.0 with hypoalbuminemia, vitamin D low at 6.26, Phos elevated at 5.8, PTH elevated to 189   Subjective:   No complaints, reports that breathing is improving.   Objective:   BP (!) 152/82   Pulse 90   Temp 98 F (36.7 C) (Oral)   Resp (!) 23   Ht 5\' 3"  (1.6 m)   Wt 73.1 kg   SpO2 99%   BMI 28.55 kg/m   Intake/Output Summary (Last 24 hours) at 12/04/2019 2637 Last data filed at 12/04/2019 8588 Gross per 24 hour  Intake 720 ml  Output 850 ml  Net -130 ml   Weight change: 0.1 kg  Physical Exam: Gen: in NAD CVS: RRR Resp: CTAB, on 2L  per [place] Abd: soft Ext: 1+ pitting edema in hips at dependent areas, left BKA  Imaging: VAS [place] ABI WITH/WO TBI  Result Date: 12/03/2019 LOWER EXTREMITY DOPPLER STUDY Indications: Ulceration, and cellulitis RLE and left BKA. Other Factors: Right great toe amputation.  Comparison Study: no prior noted Performing Technologist: June Leap Rvt, Rdms  Examination Guidelines: A complete evaluation includes at minimum, Doppler waveform signals and systolic blood pressure reading at the level of bilateral brachial, anterior tibial, and posterior tibial arteries, when vessel segments are accessible. Bilateral testing is considered an integral part of a complete examination. Photoelectric Plethysmograph (PPG) waveforms and toe systolic pressure readings are included as required and additional duplex testing as needed. Limited examinations for reoccurring indications may be performed as noted.  ABI Findings: +--------+------------------+-----+---------+--------+ Right   Rt Pressure (mmHg)IndexWaveform Comment  +--------+------------------+-----+---------+--------+ [id number]                    triphasic         +--------+------------------+-----+---------+--------+ ATA     255               1.66 biphasic          +--------+------------------+-----+---------+--------+ PTA     255               1.66 biphasic          +--------+------------------+-----+---------+--------+ +--------+------------------+-----+---------+-------+ Left    Lt Pressure (mmHg)IndexWaveform Comment +--------+------------------+-----+---------+-------+ [id number]  triphasic        +--------+------------------+-----+---------+-------+  Summary: Right: Resting right ankle-brachial index indicates noncompressible right lower extremity arteries.  *See table(s) above for measurements and observations.  Electronically signed by [name] Mayo MD on 12/03/2019 at 1:07:29 PM.   Final      Labs: BMET Recent Labs  Lab 11/28/19 1330 11/28/19 1330 11/28/19 1804 11/29/19 5465 11/30/19 0354 12/01/19 6568 12/02/19 0435 12/03/19 0531 12/04/19 0237  NA 143   < > 144 143 145 143 141 140 140  K 5.4*   < > 5.0 4.9 4.7 4.6 4.5 4.3 3.9  CL 112*   < > 114* 114* 116* 111 112* 107 109  CO2 17*  --   --  17* 18* 19* 20* 18* 19*  GLUCOSE 119*   < > 90 138* 93 124* 131* 162* 130*  BUN 78*   < > 82* 79* 76* 79* 83* 87* 85*  CREATININE 5.04*   < > 5.10* 5.19* 5.12* 5.45* 6.19* 6.28* 6.43*  CALCIUM 8.7*  --   --  8.3* 8.3* 7.9* 7.4* 7.5* 7.5*  PHOS  --   --   --   --   --   --   --  5.7* 5.8*   < > = values in this interval not displayed.   CBC Recent Labs  Lab 11/28/19 1330 11/28/19 1804 12/01/19 0616 12/02/19 0435 12/03/19 0531 12/04/19 0237  WBC 8.9   < > 7.7 8.0 8.6 8.0  NEUTROABS 7.1  --   --   --   --   --   HGB 8.5*   < > 7.0* 8.5* 8.3* 8.0*  HCT 29.0*   < > 22.9* 26.8* 26.7* 25.3*  MCV 101.0*   < > 96.2 93.7 94.3 94.1  PLT 214   < > 190 173 183 176   < > = values in this interval not displayed.    Medications:    . carvedilol  12.5 mg Oral BID WC  . Chlorhexidine Gluconate Cloth  6 each Topical Daily  . doxycycline  100 mg Oral Q12H  . heparin  5,000 Units Subcutaneous Q8H  . hydrALAZINE  10 mg Oral Q8H  . hydrocerin   Topical BID  . insulin aspart  0-5 Units Subcutaneous QHS  . insulin aspart  0-9 Units Subcutaneous TID WC  . isosorbide mononitrate  15 mg Oral Daily  . sodium bicarbonate  650 mg Oral TID  . sodium chloride flush  3 mL Intravenous Once  . tamsulosin  0.4 mg Oral QHS     [place] Constable, DO [facility] Family Medicine Resident, PGY 2 12/04/2019, 8:03 AM   I have seen and examined this patient and agree with plan and assessment in the above note with renal recommendations/intervention highlighted. Pt with multiple end-organ damage presumably due to poorly controlled DM and HTN.  SPEP/UPEP were negative for myeloma but amyloid may also be  on the differential.  Governor Rooks Arienne Gartin,MD 12/04/2019 2:58 PM

## 2019-12-05 DIAGNOSIS — I420 Dilated cardiomyopathy: Secondary | ICD-10-CM | POA: Diagnosis not present

## 2019-12-05 DIAGNOSIS — E119 Type 2 diabetes mellitus without complications: Secondary | ICD-10-CM | POA: Diagnosis not present

## 2019-12-05 DIAGNOSIS — I5021 Acute systolic (congestive) heart failure: Secondary | ICD-10-CM | POA: Diagnosis not present

## 2019-12-05 DIAGNOSIS — I10 Essential (primary) hypertension: Secondary | ICD-10-CM | POA: Diagnosis not present

## 2019-12-05 DIAGNOSIS — N185 Chronic kidney disease, stage 5: Secondary | ICD-10-CM | POA: Diagnosis not present

## 2019-12-05 LAB — RENAL FUNCTION PANEL
Albumin: 2.3 g/dL — ABNORMAL LOW (ref 3.5–5.0)
Anion gap: 8 (ref 5–15)
BUN: 81 mg/dL — ABNORMAL HIGH (ref 8–23)
CO2: 19 mmol/L — ABNORMAL LOW (ref 22–32)
Calcium: 7.5 mg/dL — ABNORMAL LOW (ref 8.9–10.3)
Chloride: 112 mmol/L — ABNORMAL HIGH (ref 98–111)
Creatinine, Ser: 6.44 mg/dL — ABNORMAL HIGH (ref 0.61–1.24)
GFR calc Af Amer: 10 mL/min — ABNORMAL LOW (ref >60)
GFR calc non Af Amer: 9 mL/min — ABNORMAL LOW (ref >60)
Glucose, Bld: 128 mg/dL — ABNORMAL HIGH (ref 70–99)
Phosphorus: 5.9 mg/dL — ABNORMAL HIGH (ref 2.5–4.6)
Potassium: 3.8 mmol/L (ref 3.5–5.1)
Sodium: 139 mmol/L (ref 135–145)

## 2019-12-05 LAB — CBC
HCT: 27.7 % — ABNORMAL LOW (ref 39.0–52.0)
Hemoglobin: 8.4 g/dL — ABNORMAL LOW (ref 13.0–17.0)
MCH: 28.9 pg (ref 26.0–34.0)
MCHC: 30.3 g/dL (ref 30.0–36.0)
MCV: 95.2 fL (ref 80.0–100.0)
Platelets: 192 10*3/uL (ref 150–400)
RBC: 2.91 MIL/uL — ABNORMAL LOW (ref 4.22–5.81)
RDW: 17.5 % — ABNORMAL HIGH (ref 11.5–15.5)
WBC: 8.2 10*3/uL (ref 4.0–10.5)
nRBC: 0 % (ref 0.0–0.2)

## 2019-12-05 LAB — GLUCOSE, CAPILLARY
Glucose-Capillary: 116 mg/dL — ABNORMAL HIGH (ref 70–99)
Glucose-Capillary: 142 mg/dL — ABNORMAL HIGH (ref 70–99)
Glucose-Capillary: 127 mg/dL — ABNORMAL HIGH (ref 70–99)
Glucose-Capillary: 160 mg/dL — ABNORMAL HIGH (ref 70–99)

## 2019-12-05 MED ORDER — CARVEDILOL 25 MG PO TABS
25.0000 mg | ORAL_TABLET | Freq: Two times a day (BID) | ORAL | Status: DC
  Administered 2019-12-05 – 2019-12-16 (×21): 25 mg via ORAL
  Filled 2019-12-05 (×17): qty 1
  Filled 2019-12-05: qty 2
  Filled 2019-12-05 (×4): qty 1

## 2019-12-05 MED ORDER — SODIUM CHLORIDE 0.9 % IV SOLN: 1.5000 g | INTRAVENOUS | Status: DC

## 2019-12-05 NOTE — Progress Notes (Signed)
Occupational Therapy Treatment Note  Pt making steady progress. Pt would benefit form short term rehab at SNF to return to PLOF as pt does not have 247 S at home. Requires minguard A with mobility and min A with LB ADL. SpO2 desat to 87 on RA during activity; 94 at rest. Pt with wound on medial aspect of residual limb - nsg notified. Pt has most likely had changes to his residual limb/wound present and would benefit from assessment of fit given by prosthetist. Will continue to follow acutely.    12/05/19 1700  OT Visit Information  Last OT Received On 12/05/19  Assistance Needed +1  History of Present Illness 64 year old man with PMH of left AKA and partial right foot amputation, no medical care for at least five years, who presented with cellulitis of the right foot and renal insufficiency. Also found to have bilateral pleural effusions Admitted 11/28/19 for treatment of cellulitis via IV antibiotics and determination of AKIvsCKD. WBAT on R LE  Precautions  Precautions Fall;Other (comment)  Precaution Comments Left AKA - LLE prosthesis  Required Braces or Orthoses  (R postop shoe)  Pain Assessment  Pain Assessment Faces  Faces Pain Scale 2  Pain Location R foot  Pain Descriptors / Indicators Discomfort  Pain Intervention(s) Limited activity within patient's tolerance  Cognition  Arousal/Alertness Awake/alert  Behavior During Therapy WFL for tasks assessed/performed  Overall Cognitive Status Within Functional Limits for tasks assessed  Area of Impairment Safety/judgement  Safety/Judgement Decreased awareness of safety;Decreased awareness of deficits  General Comments most likely baleine  Upper Extremity Assessment  Upper Extremity Assessment Overall WFL for tasks assessed  ADL  Overall ADL's  Needs assistance/impaired  Grooming Supervision/safety;Standing  Upper Body Bathing Set up;Sitting  Lower Body Bathing Supervison/ safety;Sit to/from stand  Upper Body Dressing  Set up;Sitting   Lower Body Dressing Min guard;Sit to/from Environmental education officer guard;Ambulation;RW  Toileting - Clothing Manipulation Details (indicate cue type and reason) foley  Functional mobility during ADLs Min guard;Rolling walker  General ADL Comments Desat to 87 on RA   Bed Mobility  Overal bed mobility Modified Independent  Balance  Overall balance assessment Needs assistance  Sitting balance-Leahy Scale Good  Standing balance-Leahy Scale Fair  Transfers  Overall transfer level Needs assistance  Transfers Sit to/from Stand  Sit to Stand Min guard  General Comments  General comments (skin integrity, edema, etc.) Able to donn prosthetic leg however pt leaves @ 2 inches of sleeve hangin. also noted wound on medial aspect of residual limb - nsg notified  OT - End of Session  Equipment Utilized During Treatment Gait belt;Rolling walker  Activity Tolerance Patient tolerated treatment well  Patient left in bed;with call bell/phone within reach  Nurse Communication Mobility status;Other (comment) (wound on R residual limb)  OT Assessment/Plan  OT Plan Discharge plan needs to be updated  OT Visit Diagnosis Unsteadiness on feet (R26.81);Muscle weakness (generalized) (M62.81)  OT Frequency (ACUTE ONLY) Min 2X/week  Follow Up Recommendations SNF;Supervision/Assistance - 24 hour  OT Equipment 3 in 1 bedside commode  AM-PAC OT "6 Clicks" Daily Activity Outcome Measure (Version 2)  Help from another person eating meals? 4  Help from another person taking care of personal grooming? 3  Help from another person toileting, which includes using toliet, bedpan, or urinal? 3  Help from another person bathing (including washing, rinsing, drying)? 3  Help from another person to put on and taking off regular upper body clothing? 3  Help  from another person to put on and taking off regular lower body clothing? 3  6 Click Score 19  OT Goal Progression  Progress towards OT goals Progressing toward goals   Acute Rehab OT Goals  Patient Stated Goal to get back to being independent  Time For Goal Achievement 12/15/19  Potential to Achieve Goals Good  ADL Goals  Pt Will Perform Grooming with modified independence;standing  Pt Will Perform Lower Body Bathing with modified independence;sit to/from stand  Pt Will Perform Lower Body Dressing with modified independence;sit to/from stand  Pt Will Transfer to Toilet with modified independence;ambulating;regular height toilet  Pt Will Perform Toileting - Clothing Manipulation and hygiene with modified independence;sit to/from stand  Additional ADL Goal #1 Pt to recall and verbalize 3 energy conservation strategies with 0 verbal cues.  OT Time Calculation  OT Start Time (ACUTE ONLY) 1637  OT Stop Time (ACUTE ONLY) 1700  OT Time Calculation (min) 23 min  OT General Charges  $OT Visit 1 Visit  OT Treatments  $Self Care/Home Management  23-37 mins  Maurie Boettcher, OT/L   Acute OT Clinical Specialist [place] Pager [phone] Office [phone]

## 2019-12-05 NOTE — Progress Notes (Addendum)
Progress Note  Patient Name: [name] Date of Encounter: 12/05/2019  Primary Cardiologist: No primary care provider on file.   Subjective   Denies any chest pain or SOB.  Had PYP scan done yesterday but results are pending  Inpatient Medications    Scheduled Meds: . carvedilol  12.5 mg Oral BID WC  . Chlorhexidine Gluconate Cloth  6 each Topical Daily  . doxycycline  100 mg Oral Q12H  . heparin  5,000 Units Subcutaneous Q8H  . hydrALAZINE  10 mg Oral Q8H  . hydrocerin   Topical BID  . insulin aspart  0-5 Units Subcutaneous QHS  . insulin aspart  0-9 Units Subcutaneous TID WC  . isosorbide mononitrate  15 mg Oral Daily  . sodium bicarbonate  650 mg Oral TID  . sodium chloride flush  3 mL Intravenous Once  . tamsulosin  0.4 mg Oral QHS   Continuous Infusions:  PRN Meds:    Vital Signs    Vitals:   12/05/19 0000 12/05/19 0004 12/05/19 0400 12/05/19 0412  BP: (!) 145/81  140/79   Pulse: 91 85 89   Resp: (!) 23 20 20    Temp:   98.4 F (36.9 C)   TempSrc:   Oral   SpO2: 100% 99% 98%   Weight:    70 kg  Height:       No intake or output data in the 24 hours ending 12/05/19 0744 Filed Weights   12/03/19 0519 12/04/19 0608 12/05/19 0412  Weight: 73 kg 73.1 kg 70 kg    Telemetry    NSR- Personally Reviewed  ECG    No new EKG to review- Personally Reviewed  Physical Exam   GEN: Well nourished, well developed in no acute distress HEENT: Normal NECK: No JVD; No carotid bruits LYMPHATICS: No lymphadenopathy CARDIAC:RRR, no murmurs, rubs, gallops RESPIRATORY: crackles at bases ABDOMEN: Soft, non-tender, non-distended MUSCULOSKELETAL:  2+ RLE edema, left AKA SKIN: Warm and dry NEUROLOGIC:  Alert and oriented x 3 PSYCHIATRIC:  Normal affect    Labs    Chemistry Recent Labs  Lab 11/28/19 1330 11/28/19 1804 11/29/19 0426 11/30/19 0443 12/03/19 0531 12/04/19 0237 12/05/19 0353  NA 143   < > 143   < > 140 140 139  K 5.4*   < > 4.9   < > 4.3 3.9  3.8  CL 112*   < > 114*   < > 107 109 112*  CO2 17*  --  17*   < > 18* 19* 19*  GLUCOSE 119*   < > 138*   < > 162* 130* 128*  BUN 78*   < > 79*   < > 87* 85* 81*  CREATININE 5.04*   < > 5.19*   < > 6.28* 6.43* 6.44*  CALCIUM 8.7*  --  8.3*   < > 7.5* 7.5* 7.5*  PROT 6.9  --  6.6  --   --   --   --   ALBUMIN 2.8*  --  2.6*  --  2.2* 2.1* 2.3*  AST 15  --  16  --   --   --   --   ALT 14  --  14  --   --   --   --   ALKPHOS 100  --  90  --   --   --   --   BILITOT 1.0  --  1.0  --   --   --   --  GFRNONAA 11*  --  11*   < > 9* 9* 9*  GFRAA 13*  --  13*   < > 10* 10* 10*  ANIONGAP 14  --  12   < > 15 12 8    < > = values in this interval not displayed.     Hematology Recent Labs  Lab 12/03/19 0531 12/04/19 0237 12/05/19 0353  WBC 8.6 8.0 8.2  RBC 2.83* 2.69* 2.91*  HGB 8.3* 8.0* 8.4*  HCT 26.7* 25.3* 27.7*  MCV 94.3 94.1 95.2  MCH 29.3 29.7 28.9  MCHC 31.1 31.6 30.3  RDW 17.8* 17.5* 17.5*  PLT 183 176 192    Cardiac EnzymesNo results for input(s): TROPONINI in the last 168 hours. No results for input(s): TROPIPOC in the last 168 hours.   BNPNo results for input(s): BNP, PROBNP in the last 168 hours.   DDimer No results for input(s): DDIMER in the last 168 hours.   Radiology    VAS [place] ABI WITH/WO TBI  Result Date: 12/03/2019 LOWER EXTREMITY DOPPLER STUDY Indications: Ulceration, and cellulitis RLE and left BKA. Other Factors: Right great toe amputation.  Comparison Study: no prior noted Performing Technologist: June Leap Rvt, Rdms  Examination Guidelines: A complete evaluation includes at minimum, Doppler waveform signals and systolic blood pressure reading at the level of bilateral brachial, anterior tibial, and posterior tibial arteries, when vessel segments are accessible. Bilateral testing is considered an integral part of a complete examination. Photoelectric Plethysmograph (PPG) waveforms and toe systolic pressure readings are included as required and additional  duplex testing as needed. Limited examinations for reoccurring indications may be performed as noted.  ABI Findings: +--------+------------------+-----+---------+--------+ Right   Rt Pressure (mmHg)IndexWaveform Comment  +--------+------------------+-----+---------+--------+ [id number]                    triphasic         +--------+------------------+-----+---------+--------+ ATA     255               1.66 biphasic          +--------+------------------+-----+---------+--------+ PTA     255               1.66 biphasic          +--------+------------------+-----+---------+--------+ +--------+------------------+-----+---------+-------+ Left    Lt Pressure (mmHg)IndexWaveform Comment +--------+------------------+-----+---------+-------+ [id number]                    triphasic        +--------+------------------+-----+---------+-------+  Summary: Right: Resting right ankle-brachial index indicates noncompressible right lower extremity arteries.  *See table(s) above for measurements and observations.  Electronically signed by [name] Mayo MD on 12/03/2019 at 1:07:29 PM.   Final     Cardiac Studies   2D echo IMPRESSIONS   1. Large left pleural effusion with atelectatic lung tissue seen.  2. Diffuse hypokinesis worse in the inferior base . Left ventricular  ejection fraction, by estimation, is 30 to 35%. The left ventricle has  moderately decreased function. The left ventricle demonstrates global  hypokinesis. The left ventricular internal  cavity size was mildly dilated. There is moderate left ventricular  hypertrophy. Left ventricular diastolic parameters are consistent with  Grade II diastolic dysfunction (pseudonormalization). Elevated left  ventricular end-diastolic pressure.  3. Right ventricular systolic function is moderately reduced. The right  ventricular size is mildly enlarged.  4. The mitral valve is normal in structure. Mild mitral valve    regurgitation. No evidence of mitral stenosis.  5. The  aortic valve is tricuspid. Aortic valve regurgitation is not  visualized. Mild aortic valve sclerosis is present, with no evidence of  aortic valve stenosis.  6. The inferior vena cava is dilated in size with <50% respiratory  variability, suggesting right atrial pressure of 15 mmHg.   Patient Profile     64 y.o. 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  Assessment & Plan    1.  Acute systolic CHF -this is a new diagnosis -etiology of biventricular dysfunction unknown -he is significantly anemic, has low albumin and worsening renal function, likely all playing a role in volume overload but unclear etiology of DCM> no recent URI and no hx of COVID infection -He has had poorly controlled HTN and also has risk factors for CAD -will need ischemic workup at some point but given his severe CKD, cath would likely lead to need for permanent HD so will start with Lexiscan myoview to rule out ischemia and if high risk then will need to discuss cath -I&O's not recorded -remains volume overloaded on exam -volume management per renal -not a candidate for ARB/ACE/Entresto/Spiro given CKD stage 5 -Hydralazine 10mg  TID and Imdur 15mg  daily and titrate as BP allows -Would like HR<80 and BP so will increase Carvedilol to 25mg  BID -PYP scan results pending -avoid cath or coronary CTA at this time due to CKD -make NPO after MN for Lexiscan myoview in the am  2.  HTN -BP better controlled -continue Hydralazine 10mg  TID, Carvedilol and Imdur  3.  CKD stage 5 -per nephrology -renal function continues to decline and now Creat 6.44 - may have some cardiorenal effects -renal considering Vascular consult for access  4.  DM2 -per IM service  I have spent a total of 35 minutes with patient reviewing  hospital notes , telemetry, EKGs, labs and examining patient as well as establishing an assessment and plan that was discussed with the patient.  > 50% of time was spent in direct patient care.       For questions or updates, please contact [place] Please consult [email or web address] for contact info under Cardiology/STEMI.      Signed, Fransico Him, MD  12/05/2019, 7:44 AM

## 2019-12-05 NOTE — Consult Note (Addendum)
[facility] NOTE   MRN : [id number]  Reason for Consult: ESRD Referring Physician: [place]  History of Present Illness: 64 y/o male with AKI on CKD.  We have been asked to provide [facility] and permanent  access.    Past medical history: insulin-dependent diabetes, hepatitis, hypertension, s/p left AKA and status post right partial foot amputation followed by Dr. Sharol Given.     Current Facility-Administered Medications  Medication Dose Route Frequency Provider Last Rate Last Admin  . carvedilol (COREG) tablet 25 mg  25 mg Oral BID WC Sueanne Margarita, MD   25 mg at 12/05/19 0843  . Chlorhexidine Gluconate Cloth 2 % PADS 6 each  6 each Topical Daily Sid Falcon, MD   6 each at 12/04/19 2139  . doxycycline (VIBRA-TABS) tablet 100 mg  100 mg Oral Q12H Christian, Rylee, MD   100 mg at 12/05/19 0844  . heparin injection 5,000 Units  5,000 Units Subcutaneous Q8H Kathi Ludwig, MD   5,000 Units at 12/05/19 0556  . hydrALAZINE (APRESOLINE) tablet 10 mg  10 mg Oral Q8H Sueanne Margarita, MD   10 mg at 12/05/19 0556  . hydrocerin (EUCERIN) cream   Topical BID Sid Falcon, MD   Given at 12/05/19 [phone]  . insulin aspart (novoLOG) injection 0-5 Units  0-5 Units Subcutaneous QHS Harbrecht, Lawrence, MD      . insulin aspart (novoLOG) injection 0-9 Units  0-9 Units Subcutaneous TID WC Kathi Ludwig, MD   1 Units at 12/04/19 1151  . isosorbide mononitrate (IMDUR) 24 hr tablet 15 mg  15 mg Oral Daily Sueanne Margarita, MD   15 mg at 12/05/19 0844  . sodium bicarbonate tablet 650 mg  650 mg Oral TID Claudia Desanctis, MD   650 mg at 12/05/19 0844  . sodium chloride flush (NS) 0.9 % injection 3 mL  3 mL Intravenous Once Drenda Freeze, MD      . tamsulosin [facility]) capsule 0.4 mg  0.4 mg Oral QHS Claudia Desanctis, MD   0.4 mg at 12/04/19 2132    Pt meds include: Statin :No Betablocker: Yes ASA: No Other anticoagulants/antiplatelets: none  Past Medical  History:  Diagnosis Date  . Diabetes mellitus without complication (HCC)    PATIENT JUST LEARNED HE WAS DIABETIC  . Hepatitis   . Hypertension   . Hypokalemia   . Hyponatremia   . Necrotizing fasciitis ([place])   . Pneumonia    HX OF PNA    Past Surgical History:  Procedure Laterality Date  . AMPUTATION Left 09/11/2013   Procedure: AMPUTATION BELOW KNEE;  Surgeon: Marianna Payment, MD;  Location: [place];  Service: Orthopedics;  Laterality: Left;  . AMPUTATION Right 12/18/2013   Procedure: RIGHT FOOT 1,2, TOE AMPUTATION  5th toe RAY AMPUTATION;  Surgeon: Marianna Payment, MD;  Location: [place];  Service: Orthopedics;  Laterality: Right;  . CHOLECYSTECTOMY    . I & D EXTREMITY Left 09/03/2013   Procedure: IRRIGATION AND DEBRIDEMENT EXTREMITY;  Surgeon: Marianna Payment, MD;  Location: [place];  Service: Orthopedics;  Laterality: Left;  . I & D EXTREMITY Left 09/11/2013   Procedure: LEFT FOOT IRRIGATION AND DEBRIDEMENT;  Surgeon: Marianna Payment, MD;  Location: [place];  Service: Orthopedics;  Laterality: Left;    Social History Social History   Tobacco Use  . Smoking status: Never Smoker  . Smokeless tobacco: Never Used  Substance Use Topics  . Alcohol  use: No  . Drug use: No    Family History Family History  Problem Relation Age of Onset  . Diabetes type II Mother   . Dementia Mother   . Heart disease Father     Allergies  Allergen Reactions  . Tramadol Nausea And Vomiting    Pt states he took this on an empty stomach one time and it came right back up. He's not had it since.      REVIEW OF SYSTEMS  General: [ ]  Weight loss, [ ]  Fever, [ ]  chills Neurologic: [ ]  Dizziness, [ ]  Blackouts, [ ]  Seizure [ ]  Stroke, [ ]  "Mini stroke", [ ]  Slurred speech, [ ]  Temporary blindness; [ ]  weakness in arms or legs, [ ]  Hoarseness [ ]  Dysphagia Cardiac: [ ]  Chest pain/pressure, [ ]  Shortness of breath at rest [ ]  Shortness of breath with exertion, [ ]  Atrial fibrillation or  irregular heartbeat  Vascular: [ ]  Pain in legs with walking, [ ]  Pain in legs at rest, [ ]  Pain in legs at night,  [ ]  Non-healing ulcer, [ ]  Blood clot in vein/DVT,   Pulmonary: [ ]  Home oxygen, [ ]  Productive cough, [ ]  Coughing up blood, [ ]  Asthma,  [ ]  Wheezing [ ]  COPD Musculoskeletal:  [ ]  Arthritis, [ ]  Low back pain, [ ]  Joint pain Hematologic: [ ]  Easy Bruising, [ ]  Anemia; [ ]  Hepatitis Gastrointestinal: [ ]  Blood in stool, [ ]  Gastroesophageal Reflux/heartburn, Urinary: [x ] chronic Kidney disease, [ ]  on HD - [ ]  MWF or [ ]  TTHS, [ ]  Burning with urination, [ ]  Difficulty urinating Skin: [ ]  Rashes, [ ]  Wounds Psychological: [ ]  Anxiety, [ ]  Depression  Physical Examination Vitals:   12/05/19 0004 12/05/19 0400 12/05/19 0412 12/05/19 0800  BP:  140/79  (!) 152/84  Pulse: 85 89  87  Resp: 20 20  19   Temp:  98.4 F (36.9 C)  97.6 F (36.4 C)  TempSrc:  Oral  Oral  SpO2: 99% 98%  98%  Weight:   70 kg   Height:       Body mass index is 27.34 kg/m.  General:  WDWN in NAD Gait: Normal HENT: WNL Eyes: Pupils equal Pulmonary: normal non-labored breathing , without Rales, rhonchi,  wheezing Cardiac: RRR, without  Murmurs, rubs or gallops; No carotid bruits Abdomen: soft, NT, no masses Skin: no rashes, ulcers noted;  no Gangrene , no cellulitis; no open wounds;   Vascular Exam/Pulses:palpable radial pulses B UE   Musculoskeletal: no muscle wasting or atrophy; no edema  Neurologic: A&O X 3; Appropriate Affect ;  SENSATION: normal; MOTOR FUNCTION: 5/5 Symmetric, L AKA right TMA leg wound manged by DR. Sharol Given Speech is fluent/normal   Significant Diagnostic Studies: CBC Lab Results  Component Value Date   WBC 8.2 12/05/2019   HGB 8.4 (L) 12/05/2019   HCT 27.7 (L) 12/05/2019   MCV 95.2 12/05/2019   PLT 192 12/05/2019    BMET    Component Value Date/Time   NA 139 12/05/2019 0353   K 3.8 12/05/2019 0353   CL 112 (H) 12/05/2019 0353   CO2 19 (L) 12/05/2019  0353   GLUCOSE 128 (H) 12/05/2019 0353   BUN 81 (H) 12/05/2019 0353   CREATININE 6.44 (H) 12/05/2019 0353   CALCIUM 7.5 (L) 12/05/2019 0353   GFRNONAA 9 (L) 12/05/2019 0353   GFRAA 10 (L) 12/05/2019 0353   Estimated Creatinine Clearance: 10.6 mL/min (A) (  by C-G formula based on SCr of 6.44 mg/dL (H)).  COAG No results found for: INR, PROTIME   Non-Invasive Vascular Imaging:  Pending  ASSESSMENT/PLAN:  AKI on CKD now in ESRD Plan for left UE AV fistula verses Graft pending vein mapping and TDC placement.  Patient in agreement.    Roxy Horseman 12/05/2019 11:40 AM  I have independently interviewed and examined patient and agree with PA assessment and plan above.  Patient has stress test scheduled tomorrow and we will plan access pending vein mapping and need for possible further coronary evaluation.  Dari Carpenito C. Donzetta Matters, MD [facility] [place] Office: [phone] Pager: [phone]

## 2019-12-05 NOTE — Progress Notes (Addendum)
[facility] Daily progress note  Reason for consult: AKI in patient with multiple endorgan damage  S: Patient seen sitting upright on the edge of bed eating breakfast this morning, pleasant patient.  States he is breathing better, no other concerns at this time.  O: BP (!) 152/84 (BP Location: Left Arm)   Pulse 87   Temp 97.6 F (36.4 C) (Oral)   Resp 19   Ht 5\' 3"  (1.6 m)   Wt 70 kg   SpO2 98%   BMI 27.34 kg/m   Intake/Output Summary (Last 24 hours) at 12/05/2019 1206 Last data filed at 12/05/2019 0940 Gross per 24 hour  Intake 360 ml  Output --  Net 360 ml   Intake/Output: I/O last 3 completed shifts: In: -  Out: 850 [Urine:850]  Intake/Output this shift:  Total I/O In: 360 [P.O.:360] Out: -  Weight change: -3.1 kg  PHYSICAL EXAM:  Gen: Pleasant patient, no apparent distress, nontoxic-appearing CVS: RRR, S1-S2 present, no murmurs appreciated Resp: CTA bilaterally, comfortable work of breathing, speaking complete sentences Abd: Soft, nontender, normal bowel sounds appreciated Ext: Left BKA, right lower extremity 2+ pitting edema from foot up to the knee, venous insufficiency dermatitis appreciated to right lower extremity without any evidence of infection  Recent Labs  Lab 11/28/19 1330 11/28/19 1804 11/29/19 0426 11/30/19 0443 12/01/19 0616 12/02/19 0435 12/03/19 0531 12/04/19 0237 12/05/19 0353  NA 143   < > 143 145 143 141 140 140 139  K 5.4*   < > 4.9 4.7 4.6 4.5 4.3 3.9 3.8  CL 112*   < > 114* 116* 111 112* 107 109 112*  CO2 17*  --  17* 18* 19* 20* 18* 19* 19*  GLUCOSE 119*   < > 138* 93 124* 131* 162* 130* 128*  BUN 78*   < > 79* 76* 79* 83* 87* 85* 81*  CREATININE 5.04*   < > 5.19* 5.12* 5.45* 6.19* 6.28* 6.43* 6.44*  ALBUMIN 2.8*  --  2.6*  --   --   --  2.2* 2.1* 2.3*  CALCIUM 8.7*  --  8.3* 8.3* 7.9* 7.4* 7.5* 7.5* 7.5*  PHOS  --   --   --   --   --   --  5.7* 5.8* 5.9*  AST 15  --  16  --   --   --   --   --   --   ALT 14  --  14   --   --   --   --   --   --    < > = values in this interval not displayed.   Liver Function Tests: Recent Labs  Lab 11/28/19 1330 11/28/19 1330 11/29/19 0426 11/29/19 0426 12/03/19 0531 12/04/19 0237 12/05/19 0353  AST 15  --  16  --   --   --   --   ALT 14  --  14  --   --   --   --   ALKPHOS 100  --  90  --   --   --   --   BILITOT 1.0  --  1.0  --   --   --   --   PROT 6.9  --  6.6  --   --   --   --   ALBUMIN 2.8*   < > 2.6*   < > 2.2* 2.1* 2.3*   < > = values in this interval not displayed.  No results for input(s): LIPASE, AMYLASE in the last 168 hours. No results for input(s): AMMONIA in the last 168 hours. CBC: Recent Labs  Lab 11/28/19 1330 11/28/19 1804 12/01/19 0616 12/01/19 0616 12/02/19 0435 12/02/19 0435 12/03/19 0531 12/04/19 0237 12/05/19 0353  WBC 8.9   < > 7.7   < > 8.0   < > 8.6 8.0 8.2  NEUTROABS 7.1  --   --   --   --   --   --   --   --   HGB 8.5*   < > 7.0*   < > 8.5*   < > 8.3* 8.0* 8.4*  HCT 29.0*   < > 22.9*   < > 26.8*   < > 26.7* 25.3* 27.7*  MCV 101.0*   < > 96.2  --  93.7  --  94.3 94.1 95.2  PLT 214   < > 190   < > 173   < > 183 176 192   < > = values in this interval not displayed.   Cardiac Enzymes: No results for input(s): CKTOTAL, CKMB, CKMBINDEX, TROPONINI in the last 168 hours. CBG: Recent Labs  Lab 12/04/19 1140 12/04/19 1655 12/04/19 2115 12/05/19 0807 12/05/19 1141  GLUCAP 126* 95 177* 116* 142*    Iron Studies:  Recent Labs    12/03/19 0531  IRON 51  TIBC 232*  FERRITIN 352*   Studies/Results: NM CARDIAC AMYLOID TUMOR LOC INFLAM SPECT 1 DAY  Result Date: 12/05/2019 CLINICAL DATA:  64 year old male. HEART FAILURE. CONCERN FOR CARDIAC AMYLOIDOSIS. EXAM: NUCLEAR MEDICINE TUMOR LOCALIZATION. PYP CARDIAC AMYLOIDOSIS SCAN WITH SPECT TECHNIQUE: Following intravenous administration of radiopharmaceutical, anterior planar images of the chest were obtained. Regions of interest were placed on the heart and  contralateral chest wall for quantitative assessment. Additional SPECT imaging of the chest was obtained. RADIOPHARMACEUTICALS:  21.2 mCi TECHNETIUM 99 PYROPHOSPHATE FINDINGS: Planar Visual assessment: Anterior planar imaging demonstrates radiotracer uptake within the heart less than uptake within the adjacent ribs (Grade 1). Quantitative assessment : Quantitative assessment of the cardiac uptake compared to the contralateral chest wall is equal to 1.0 (H/CL = 1.0). SPECT assessment: SPECT imaging of the chest demonstrates trace radiotracer accumulation within the LEFT ventricle. IMPRESSION: Visual and quantitative assessment (grade 1, H/CL equal 1.0) are NOT suggestive of transthyretin amyloidosis. Electronically Signed   By: Suzy Bouchard M.D.   On: 12/05/2019 09:11   . carvedilol  25 mg Oral BID WC  . Chlorhexidine Gluconate Cloth  6 each Topical Daily  . doxycycline  100 mg Oral Q12H  . heparin  5,000 Units Subcutaneous Q8H  . hydrALAZINE  10 mg Oral Q8H  . hydrocerin   Topical BID  . insulin aspart  0-5 Units Subcutaneous QHS  . insulin aspart  0-9 Units Subcutaneous TID WC  . isosorbide mononitrate  15 mg Oral Daily  . sodium bicarbonate  650 mg Oral TID  . sodium chloride flush  3 mL Intravenous Once  . tamsulosin  0.4 mg Oral QHS    BMET    Component Value Date/Time   NA 139 12/05/2019 0353   K 3.8 12/05/2019 0353   CL 112 (H) 12/05/2019 0353   CO2 19 (L) 12/05/2019 0353   GLUCOSE 128 (H) 12/05/2019 0353   BUN 81 (H) 12/05/2019 0353   CREATININE 6.44 (H) 12/05/2019 0353   CALCIUM 7.5 (L) 12/05/2019 0353   GFRNONAA 9 (L) 12/05/2019 0353   GFRAA 10 (L) 12/05/2019 0353   CBC  Component Value Date/Time   WBC 8.2 12/05/2019 0353   RBC 2.91 (L) 12/05/2019 0353   HGB 8.4 (L) 12/05/2019 0353   HCT 27.7 (L) 12/05/2019 0353   HCT 25.5 (L) 11/29/2019 1103   PLT 192 12/05/2019 0353   MCV 95.2 12/05/2019 0353   MCH 28.9 12/05/2019 0353   MCHC 30.3 12/05/2019 0353   RDW 17.5  (H) 12/05/2019 0353   LYMPHSABS 0.7 11/28/2019 1330   MONOABS 0.5 11/28/2019 1330   EOSABS 0.5 11/28/2019 1330   BASOSABS 0.0 11/28/2019 1330    Assessment/Plan:  AKI: likely Subacute/progressive CKD due to poorly controlled DM and HTN for past 5 years, as well as acute CHF.BUN 87>81, Cr 6.28>6.44. No UOP recorded yesterday, was 850 cc day prior.  Had discussion with patient 5/4 regarding RRT including HD, PD, home HD. -I consulted [facility] today and Dr. Donzetta Matters will order vein mapping and likely place AVF/TDC Monday.  He is for a myoview and should be able to hold off starting dialysis until Monday.  Cardiomyopathy with EF of 30-35% and grade II diastolic dysfunction: Markedly volume overloaded.  Cardiology consulted on 5/4,  -Discontinue Metoprolol, changed to Coreg, added hydralazine and Imdur -Planning for [facility] given worsening renal function  Anemia of CKD: s/p 1u pRBC on 5/2.Hgb stable at 8.3.Ferritin elevated at 352, TIBC low at 232, iron 51. SPEP, UPEP negative, only suggestive of hypoalbuminemia. Will likely need to start ESA.  HTN, improved with meds: BP this AM 152/84, unchanged. Likely has been longstanding and undiagnosed/treated with end organ damage (CKD, LVH, hypertensive heart disease).  -Continue on Coreg, Hydral, Imdur per cardiology.  Cellulitis of RLE: s/p 3d vanc/CTX/flagyl, on 5/2 transitioned to doxycycline. Continuing x 10 days, per primary team. Patient had ABIs performed today 5/4, "Resting right ankle-brachial index indicates noncompressible right lower extremity arteries."  Suspect secondary hyperparathyroidism: Calcium corrects to 9.0 with hypoalbuminemia, vitamin D low at 6.26, Phos elevated at 5.8, PTH elevated to [place], [place], PGY-2 12/05/2019 12:07 PM   I have seen and examined this patient and agree with plan and assessment in the above note with renal recommendations/intervention highlighted. Pt is  without uremic symptoms but has rising BUN/Cr and remains volume overloaded.  Vein mapping tomorrow and avf/tdc placement on Monday per Dr. Donzetta Matters. Governor [place] Gideon Burstein,MD 12/05/2019 2:47 PM

## 2019-12-05 NOTE — Progress Notes (Signed)
Orthopedic Tech Progress Note Patient Details:  [name] May 15, 1956 [id number]  Ortho Devices Type of Ortho Device: Haematologist Ortho Device/Splint Location: RLE Ortho Device/Splint Interventions: Ordered, Application   Post Interventions Patient Tolerated: Well Instructions Provided: Care of [place] 12/05/2019, 4:13 PM

## 2019-12-05 NOTE — Progress Notes (Signed)
  Date: 12/05/2019  Patient name: [name]  Medical record number: [id number]  Date of birth: 06/29/1956        I have seen and evaluated this patient and I have discussed the plan of care with the house staff. Please see Dr. Chase Picket note for complete details. I concur with her findings and plan.  Reviewed nephrology, vascular and cardiology notes as well.    Sid Falcon, MD 12/05/2019, 5:56 PM

## 2019-12-05 NOTE — Progress Notes (Signed)
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  Subjective/Interm history  Foley removed yesterday however was retaining overnight (bladder scan 444mL) so foley replaced.  Significant Hospital Events   4/29 hospital admission 4/30 ortho and neph consulted. Sodium bicarb started 5/1 40mg  lasix>1.6L out. Lopressor started. 5/2 transfused 1U PRBC for hgb of 7. Vanc/rocephin/flagyl stopped>transitioned to doxy. flomax started for urinary retention.  5/3 hgb 8.5. renal function continuing to decline 5/4 cr continuing to rise, cardiology consulted--hydral/imdur started, metop>coreg 5/5 no significant changes. Foley removed however was retaining so foley replaced.  5/6 creatinine plateauing. vasc consulted for permanent access  Objective   Blood pressure 140/79, pulse 89, temperature 98.4 F (36.9 C), temperature source Oral, resp. rate 20, height 5\' 3"  (1.6 m), weight 70 kg, SpO2 98 %.     Intake/Output Summary (Last 24 hours) at 12/05/2019 0513 Last data filed at 12/04/2019 0523 Gross per 24 hour  Intake --  Output 850 ml  Net -850 ml   Filed Weights   12/03/19 0519 12/04/19 0608 12/05/19 0412  Weight: 73 kg 73.1 kg 70 kg    Examination: GENERAL: in no acute distress CARDIAC: heart RRR. +3 RLE pitting edema PULMONARY: breathing comfortably on [place]. Lung sounds diminished at the bases SKIN: erythema/edema with scabs to right foot. Compression wrapping in place.   Consults:  Orthopedics-signed off Nephology  Cardiology Significant Diagnostic Tests:  4/29 R foot MRI>>no evidence of loculated fluid collection or osteomyelitis  4/29 CXR>>b/l effusions with basilar atelectasis vs pna; vascular congestion with diffuse interstitial  opacities suspicious for pulm edema  4/30 CT renal study>>no acute abnormalities in abd/pelvis.  4/30 echocardiogram>>Left ventricular ejection fraction, by estimation, is 30 to 35%. The left ventricle has moderately decreased function. The left ventricle demonstrates global hypokinesis. Diffuse hypokinesis worse in the inferior base .  The left ventricular internal cavity size was mildly dilated. There is moderate left ventricular hypertrophy. Left ventricular diastolic parameters are consistent with Grade II diastolic dysfunction (pseudonormalization). Elevated left ventricular end-diastolic pressure. Right ventricular systolic function is moderately reduced. The right ventricular size is mildly enlarged. The mitral valve is normal in structure. Mild mitral valve egurgitation. No evidence of mitral stenosis. The aortic valve is tricuspid. Aortic valve regurgitation is not  visualized. Mild aortic valve sclerosis is present, with no evidence of aortic valve stenosis. The inferior vena cava is dilated in size with <50% respiratory  variability, suggesting right atrial pressure of 15 mmHg.   5/5 cardiac amyloid scan>not suggestive of amyloid  Micro Data:  4/29 blood cultures>>NG 5/2 urine cultures>>NG  Antimicrobials:  Vancomycin 4/29>>5/2 Rocephin 4/29>>5/2 Flagyl 4/29>>5/2 Doxycycline 5/2>>  Summary  64 yo male with T2DM and HTN who was admitted to IMTS on 4/29 for purulent cellulitis of RLE.  Resolved/Stable hospital problems  Bacturia-received 3d course of vanc/rocephin/flagyl. No growth on urine culture  Borderline gamma gap--4. SPEP/UPEP normal Assessment & Plan:  Principal Problem:   Type 2 diabetes mellitus with diabetic foot infection ([place]) Active Problems:   Renal failure   Anemia   Uncontrolled hypertension   CKD (chronic kidney disease), stage V (HCC)   Shortness of breath   Pressure injury of skin  Nonpurulent cellulitis of the RLE in  the setting of PAD,  venous/lymphatic insufficiency and T2DM Plan: continue doxycycline--day #7/10. UNA boot  CKD V with metabolic acidosis. Likely due to poorly controlled DM and HTN along with CHF -renal function seems to be plateauing now. No urgent need for HD -continues to have reasonable UOP.  Plan Management per nephrology -vascular consulted for AVG/AVF +/[facility] pending completion of vein mapping Sodium bicarb 650mg  tid  Strict I/O  Urinary retention. Failed voiding trial 5/5 so foley replaced. Continue flomax  Newly diagnosed Combined heart failure.  EF 30-35% with global LV hypokinesis and G2DD.  Renal function precluding ACEI/ARB, entresto, spiro Cardiology consulted: will need ischemic workup at some point however will hold off on cath due to renal function. amyloid scan normal Total net -2.3L. Uncontrolled hypertension. Blood pressures stable PAD. As indicated on 5/4 ABI Plan -management per cardiology. Diuresis to be guided by nephrology. continue hydral, imdur, coreg. uptitrate as BP/HR tolerates -lexiscan per cardiology -Continuous tele monitoring, strict I/O, daily weights, fluid restriction  Acute hypoxic respiratory failure due to pulmonary edema and bilateral pleural effusions likely due to decompensated combined heart failure vs CKD 5. Remains on supplemental oxygen.  Plan: wean as able.  Normocytic anemia. Likely CKD related. Stable.  Plan: daily cbc. Transfuse for hgb <7.  T2DM. Sugars stable. Continue SSI  Balance issues/deconditioning. Likely 2/2 right foot cellulitis. PT recommending SNF at this time.  Best practice:  CODE STATUS: full Diet: renal, cardiac, fl restriction. Npo after mn for procedure DVT for prophylaxis: heparin Social considerations/Family communication: TOC to assist with insurance Dispo: discharge home pending continued renal monitoring and need for HD per neph. Ortho follow up at discharge. Sleep study and palliative care referral at discharge. PT  recommending SNF at this time   [name], MD [place] PGY-1 PAGER #: [phone] 12/05/19  5:13 AM  Labs    CBC Latest Ref Rng & Units 12/05/2019 12/04/2019 12/03/2019  WBC 4.0 - 10.5 K/uL 8.2 8.0 8.6  Hemoglobin 13.0 - 17.0 g/dL 8.4(L) 8.0(L) 8.3(L)  Hematocrit 39.0 - 52.0 % 27.7(L) 25.3(L) 26.7(L)  Platelets 150 - 400 K/uL 192 176 183   BMP Latest Ref Rng & Units 12/05/2019 12/04/2019 12/03/2019  Glucose 70 - 99 mg/dL 128(H) 130(H) 162(H)  BUN 8 - 23 mg/dL 81(H) 85(H) 87(H)  Creatinine 0.61 - 1.24 mg/dL 6.44(H) 6.43(H) 6.28(H)  Sodium 135 - 145 mmol/L 139 140 140  Potassium 3.5 - 5.1 mmol/L 3.8 3.9 4.3  Chloride 98 - 111 mmol/L 112(H) 109 107  CO2 22 - 32 mmol/L 19(L) 19(L) 18(L)  Calcium 8.9 - 10.3 mg/dL 7.5(L) 7.5(L) 7.5(L)

## 2019-12-05 NOTE — Progress Notes (Signed)
Physical Therapy Treatment Patient Details Name: Ozias Dicenzo MRN: [id number] DOB: [birth year] Today's Date: 12/05/2019    History of Present Illness 64 year old man with PMH of left AKA and partial right foot amputation, no medical care for at least five years, who presented with cellulitis of the right foot and renal insufficiency. Also found to have bilateral pleural effusions Admitted 11/28/19 for treatment of cellulitis via IV antibiotics and determination of AKIvsCKD. WBAT on R LE    PT Comments    Pt practiced transfers on and off BSC, using ortho shoe and prosthesis.  Has difficulty with balance but is expecting to get home with some assistance of family.  Follow acutely for strengthening, balance training, and with endurance with gait.  Will anticipate the need to practice stairs before dc.   Follow Up Recommendations  Home health PT;Supervision/Assistance - 24 hour     Equipment Recommendations  None recommended by PT;Other (comment)    Recommendations for Other Services       Precautions / Restrictions Precautions Precautions: Fall;Other (comment) Precaution Comments: Left AKA - LLE prosthesis Restrictions Weight Bearing Restrictions: No    Mobility  Bed Mobility Overal bed mobility: Modified Independent                Transfers Overall transfer level: Needs assistance Equipment used: Rolling walker (2 wheeled) Transfers: Sit to/from Stand Sit to Stand: Min guard Stand pivot transfers: Min guard       General transfer comment: pt prefers to attempt alone  Ambulation/Gait Ambulation/Gait assistance: Min guard Gait Distance (Feet): 90 Feet Assistive device: Rolling walker (2 wheeled) Gait Pattern/deviations: Step-through pattern;Decreased stride length;Wide base of support Gait velocity: reduced Gait velocity interpretation: <1.31 ft/sec, indicative of household ambulator General Gait Details: min guard with paced effort, dropped O2 sat on room air to  87% but did a standing rest and recovered to 89%   Stairs             Wheelchair Mobility    Modified Rankin (Stroke Patients Only)       Balance Overall balance assessment: Needs assistance Sitting-balance support: Feet supported Sitting balance-Leahy Scale: Good     Standing balance support: Bilateral upper extremity supported;During functional activity Standing balance-Leahy Scale: Fair Standing balance comment: UE support on RW with careful balance on ortho shoe and prosthesis                            Cognition Arousal/Alertness: Awake/alert Behavior During Therapy: WFL for tasks assessed/performed Overall Cognitive Status: Within Functional Limits for tasks assessed                                        Exercises      General Comments General comments (skin integrity, edema, etc.): donned leg without assistance      Pertinent Vitals/Pain Pain Assessment: Faces Faces Pain Scale: Hurts a little bit Pain Location: R foot Pain Descriptors / Indicators: Discomfort Pain Intervention(s): Limited activity within patient's tolerance;Monitored during session    Home Living                      Prior Function            PT Goals (current goals can now be found in the care plan section) Acute Rehab PT Goals Patient Stated Goal: to get  back to being independent Progress towards PT goals: Progressing toward goals    Frequency    Min 3X/week      PT Plan Current plan remains appropriate    Co-evaluation              AM-PAC PT "6 Clicks" Mobility   Outcome Measure  Help needed turning from your back to your side while in a flat bed without using bedrails?: None Help needed moving from lying on your back to sitting on the side of a flat bed without using bedrails?: A Little Help needed moving to and from a bed to a chair (including a wheelchair)?: A Little Help needed standing up from a chair using your  arms (e.g., wheelchair or bedside chair)?: A Little Help needed to walk in hospital room?: A Little Help needed climbing 3-5 steps with a railing? : A Little 6 Click Score: 19    End of Session Equipment Utilized During Treatment: Gait belt;Oxygen Activity Tolerance: Patient limited by fatigue;Treatment limited secondary to medical complications (Comment) Patient left: in bed;with call bell/phone within reach Nurse Communication: Mobility status PT Visit Diagnosis: Unsteadiness on feet (R26.81);Other abnormalities of gait and mobility (R26.89);Muscle weakness (generalized) (M62.81);Difficulty in walking, not elsewhere classified (R26.2)     Time: 5631-4970 PT Time Calculation (min) (ACUTE ONLY): 45 min  Charges:  $Gait Training: 8-22 mins $Therapeutic Activity: 23-37 mins                Ramond Dial 12/05/2019, 11:40 PM  Mee Hives, PT MS Acute Rehab Dept. Number: [place] [facility] [place]

## 2019-12-06 ENCOUNTER — Inpatient Hospital Stay (HOSPITAL_COMMUNITY): Payer: Medicare Other

## 2019-12-06 DIAGNOSIS — I429 Cardiomyopathy, unspecified: Secondary | ICD-10-CM

## 2019-12-06 DIAGNOSIS — Z01810 Encounter for preprocedural cardiovascular examination: Secondary | ICD-10-CM

## 2019-12-06 LAB — CBC
HCT: 29.4 % — ABNORMAL LOW (ref 39.0–52.0)
Hemoglobin: 9 g/dL — ABNORMAL LOW (ref 13.0–17.0)
MCH: 29 pg (ref 26.0–34.0)
MCHC: 30.6 g/dL (ref 30.0–36.0)
MCV: 94.8 fL (ref 80.0–100.0)
Platelets: 212 10*3/uL (ref 150–400)
RBC: 3.1 MIL/uL — ABNORMAL LOW (ref 4.22–5.81)
RDW: 17.7 % — ABNORMAL HIGH (ref 11.5–15.5)
WBC: 9.5 10*3/uL (ref 4.0–10.5)
nRBC: 0 % (ref 0.0–0.2)

## 2019-12-06 LAB — RENAL FUNCTION PANEL
Albumin: 2.5 g/dL — ABNORMAL LOW (ref 3.5–5.0)
Anion gap: 13 (ref 5–15)
BUN: 84 mg/dL — ABNORMAL HIGH (ref 8–23)
CO2: 18 mmol/L — ABNORMAL LOW (ref 22–32)
Calcium: 7.9 mg/dL — ABNORMAL LOW (ref 8.9–10.3)
Chloride: 109 mmol/L (ref 98–111)
Creatinine, Ser: 6.85 mg/dL — ABNORMAL HIGH (ref 0.61–1.24)
GFR calc Af Amer: 9 mL/min — ABNORMAL LOW (ref >60)
GFR calc non Af Amer: 8 mL/min — ABNORMAL LOW (ref >60)
Glucose, Bld: 136 mg/dL — ABNORMAL HIGH (ref 70–99)
Phosphorus: 6.6 mg/dL — ABNORMAL HIGH (ref 2.5–4.6)
Potassium: 4.1 mmol/L (ref 3.5–5.1)
Sodium: 140 mmol/L (ref 135–145)

## 2019-12-06 LAB — PROTIME-INR
INR: 1 (ref 0.8–1.2)
Prothrombin Time: 13.1 seconds (ref 11.4–15.2)

## 2019-12-06 LAB — GLUCOSE, CAPILLARY
Glucose-Capillary: 121 mg/dL — ABNORMAL HIGH (ref 70–99)
Glucose-Capillary: 131 mg/dL — ABNORMAL HIGH (ref 70–99)
Glucose-Capillary: 139 mg/dL — ABNORMAL HIGH (ref 70–99)
Glucose-Capillary: 133 mg/dL — ABNORMAL HIGH (ref 70–99)

## 2019-12-06 LAB — NM MYOCAR MULTI W/SPECT W/WALL MOTION / EF
Estimated workload: 1 METS
Exercise duration (min): 6 min
Exercise duration (sec): 50 s
Peak HR: 100 {beats}/min
Rest HR: 88 {beats}/min

## 2019-12-06 MED ORDER — REGADENOSON 0.4 MG/5ML IV SOLN: 0.4000 mg | Freq: Once | INTRAVENOUS | Status: AC

## 2019-12-06 MED ORDER — FUROSEMIDE 10 MG/ML IJ SOLN
40.0000 mg | Freq: Every day | INTRAMUSCULAR | Status: DC
  Administered 2019-12-06 – 2019-12-13 (×8): 40 mg via INTRAVENOUS
  Filled 2019-12-06 (×8): qty 4

## 2019-12-06 MED ORDER — HYDRALAZINE HCL 25 MG PO TABS
25.0000 mg | ORAL_TABLET | Freq: Three times a day (TID) | ORAL | Status: DC
  Administered 2019-12-06 – 2019-12-13 (×18): 25 mg via ORAL
  Filled 2019-12-06 (×18): qty 1

## 2019-12-06 MED ORDER — RENA-VITE PO TABS
1.0000 | ORAL_TABLET | Freq: Every day | ORAL | Status: DC
  Administered 2019-12-06 – 2019-12-16 (×11): 1 via ORAL
  Filled 2019-12-06 (×11): qty 1

## 2019-12-06 MED ORDER — REGADENOSON 0.4 MG/5ML IV SOLN
INTRAVENOUS | Status: AC
  Administered 2019-12-06: 0.4 mg via INTRAVENOUS
  Filled 2019-12-06: qty 5

## 2019-12-06 MED ORDER — TECHNETIUM TC 99M TETROFOSMIN IV KIT
32.6000 | PACK | Freq: Once | INTRAVENOUS | Status: AC | PRN
  Administered 2019-12-06: 32.6 via INTRAVENOUS

## 2019-12-06 MED ORDER — SEVELAMER CARBONATE 800 MG PO TABS
800.0000 mg | ORAL_TABLET | Freq: Three times a day (TID) | ORAL | Status: DC
  Administered 2019-12-06 – 2019-12-15 (×22): 800 mg via ORAL
  Filled 2019-12-06 (×24): qty 1

## 2019-12-06 MED ORDER — TECHNETIUM TC 99M TETROFOSMIN IV KIT
10.9000 | PACK | Freq: Once | INTRAVENOUS | Status: AC | PRN
  Administered 2019-12-06: 10.9 via INTRAVENOUS

## 2019-12-06 NOTE — Progress Notes (Signed)
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  Subjective/Interm history  No overnight events. No complaints this morning. Discussed plan for vein mapping and lexiscan today. AFV/AVG on Monday.  Significant Hospital Events   4/29 hospital admission 4/30 ortho and neph consulted. Sodium bicarb started 5/1 40mg  lasix>1.6L out. Lopressor started. 5/2 transfused 1U PRBC for hgb of 7. Vanc/rocephin/flagyl stopped>transitioned to doxy. flomax started for urinary retention.  5/3 hgb 8.5. renal function continuing to decline 5/4 cr continuing to rise, cardiology consulted--hydral/imdur started, metop>coreg 5/5 no significant changes. Foley removed however was retaining so foley replaced.  5/6 creatinine plateauing. vasc consulted for permanent access. 5/7 lexiscan. Planning to obtain access for HD on monday  Objective   Blood pressure 139/77, pulse 89, temperature 98.7 F (37.1 C), temperature source Oral, resp. rate (!) 38, height 5\' 3"  (1.6 m), weight 70 kg, SpO2 98 %.     Intake/Output Summary (Last 24 hours) at 12/06/2019 0541 Last data filed at 12/05/2019 0940 Gross per 24 hour  Intake 360 ml  Output --  Net 360 ml   Filed Weights   12/03/19 0519 12/04/19 0608 12/05/19 0412  Weight: 73 kg 73.1 kg 70 kg    Examination: GENERAL: in no acute distress CARDIAC: heart RRR. UNA boot on RLE with significant improvement in RLE edema since yesterday PULMONARY: diminished lung sounds at the bases SKIN: UNA boot to RLE GU: foley--no output in bag.  Consults:  Orthopedics-signed off Nephology  Cardiology Vascular surgery Significant Diagnostic Tests:  4/29 R foot MRI>>no evidence of loculated fluid collection or  osteomyelitis  4/29 CXR>>b/l effusions with basilar atelectasis vs pna; vascular congestion with diffuse interstitial opacities suspicious for pulm edema  4/30 CT renal study>>no acute abnormalities in abd/pelvis.  4/30 echocardiogram>>Left ventricular ejection fraction, by estimation, is 30 to 35%. The left ventricle has moderately decreased function. The left ventricle demonstrates global hypokinesis. Diffuse hypokinesis worse in the inferior base .  The left ventricular internal cavity size was mildly dilated. There is moderate left ventricular hypertrophy. Left ventricular diastolic parameters are consistent with Grade II diastolic dysfunction (pseudonormalization). Elevated left ventricular end-diastolic pressure. Right ventricular systolic function is moderately reduced. The right ventricular size is mildly enlarged. The mitral valve is normal in structure. Mild mitral valve egurgitation. No evidence of mitral stenosis. The aortic valve is tricuspid. Aortic valve regurgitation is not  visualized. Mild aortic valve sclerosis is present, with no evidence of aortic valve stenosis. The inferior vena cava is dilated in size with <50% respiratory  variability, suggesting right atrial pressure of 15 mmHg.   5/5 cardiac amyloid scan>not suggestive of amyloid  Micro Data:  4/29 blood cultures>>NG 5/2 urine cultures>>NG  Antimicrobials:  Vancomycin 4/29>>5/2 Rocephin 4/29>>5/2 Flagyl 4/29>>5/2 Doxycycline 5/2>>  Summary  64 yo male with T2DM and HTN who was admitted to IMTS on 4/29 for purulent cellulitis of RLE.  Resolved/Stable hospital problems  Bacturia-received 3d course of vanc/rocephin/flagyl. No growth on urine culture  Borderline gamma gap--4. SPEP/UPEP normal Assessment & Plan:  Principal Problem:   Type 2 diabetes mellitus with diabetic foot infection ([place]) Active Problems:   Renal failure   Anemia   Uncontrolled hypertension   CKD (chronic kidney disease), stage  V ([place])    Shortness of breath   Pressure injury of skin  Nonpurulent cellulitis of the RLE in the setting of PAD, venous/lymphatic insufficiency and T2DM Plan: continue doxycycline--day #8/10. UNA boot  CKD V with metabolic acidosis. Likely due to poorly controlled DM and HTN along with CHF -renal function labs still not back today and pt now in procedures. -no UOP recorded for 5/6. No urine in foley today. Plan Management per nephrology -vein mapping today with plans for access placement on Monday  -Renal function labs not back yet today--will follow up on them. Sodium bicarb 650mg  tid  Strict I/O, daily labs  Urinary retention. Failed voiding trial 5/5 so foley replaced. Continue foley and flomax  Newly diagnosed Combined heart failure.  EF 30-35% with global LV hypokinesis and G2DD.  Complicated by ESRD--still hypervolemic however renal function barring diuresis amyloid scan normal Total net -1.8L.  Wt -2.2 kg from admission Uncontrolled hypertension. Blood pressures stable PAD. As indicated on 5/4 ABI Plan -management per cardiology. continue hydral, imdur, coreg. uptitrate as BP/HR tolerates -lexiscan today per cardiology -Continuous tele monitoring, strict I/O, daily weights, fluid restriction  Acute hypoxic respiratory failure due to pulmonary edema and bilateral pleural effusions likely due to decompensated combined heart failure vs CKD 5. Remains on supplemental oxygen.  Plan: wean as able.  Normocytic anemia. Likely CKD related. Stable.  Plan: daily cbc. Transfuse for hgb <7.  T2DM. Sugars stable. Continue SSI  Balance issues/deconditioning. Likely 2/2 right foot cellulitis. PT recommending SNF at this time.  Best practice:  CODE STATUS: full Diet: npo until after lexiscan today. renal, cardiac, fl restriction.  DVT for prophylaxis: heparin Dispo: discharge home pending continued renal monitoring and need for HD per neph. Ortho follow up at discharge. Sleep study and  palliative care referral at discharge. PT recommending SNF at this time   [name], MD [place] PGY-1 PAGER #: [phone] 12/06/19  5:41 AM  Labs    CBC Latest Ref Rng & Units 12/05/2019 12/04/2019 12/03/2019  WBC 4.0 - 10.5 K/uL 8.2 8.0 8.6  Hemoglobin 13.0 - 17.0 g/dL 8.4(L) 8.0(L) 8.3(L)  Hematocrit 39.0 - 52.0 % 27.7(L) 25.3(L) 26.7(L)  Platelets 150 - 400 K/uL 192 176 183   BMP Latest Ref Rng & Units 12/05/2019 12/04/2019 12/03/2019  Glucose 70 - 99 mg/dL 128(H) 130(H) 162(H)  BUN 8 - 23 mg/dL 81(H) 85(H) 87(H)  Creatinine 0.61 - 1.24 mg/dL 6.44(H) 6.43(H) 6.28(H)  Sodium 135 - 145 mmol/L 139 140 140  Potassium 3.5 - 5.1 mmol/L 3.8 3.9 4.3  Chloride 98 - 111 mmol/L 112(H) 109 107  CO2 22 - 32 mmol/L 19(L) 19(L) 18(L)  Calcium 8.9 - 10.3 mg/dL 7.5(L) 7.5(L) 7.5(L)

## 2019-12-06 NOTE — Progress Notes (Signed)
Vein mapping       has been completed. Preliminary results can be found under CV proc through chart review. Sabriya Yono, BS, RDMS, RVT   

## 2019-12-06 NOTE — Progress Notes (Signed)
Pt refusing to have wound dressing changed or be pulled up in bed.

## 2019-12-06 NOTE — Progress Notes (Signed)
[name] presented for a nuclear stress test today.  No immediate complications.  Stress imaging is pending at this time.   Preliminary EKG findings may be listed in the chart, but the stress test result will not be finalized until perfusion imaging is complete.   [place], [place]  12/06/2019 10:41 AM

## 2019-12-06 NOTE — Progress Notes (Signed)
PT Cancellation Note  Patient Details Name: [name] MRN: [id number] DOB: Feb 13, 1956   Cancelled Treatment:    Reason Eval/Treat Not Completed: Patient at procedure or test/unavailable;Other (comment).  Pt was earlier in a stress test, currently is asking to wait until later for further therapy.  Follow up as time and pt allow.   Ramond Dial 12/06/2019, 12:47 PM   Mee Hives, PT MS Acute Rehab Dept. Number: [place] [facility] [place]

## 2019-12-06 NOTE — Plan of Care (Signed)
  Problem: Education: Goal: Knowledge of General Education information will improve Description Including pain rating scale, medication(s)/side effects and non-pharmacologic comfort measures Outcome: Progressing   

## 2019-12-06 NOTE — Progress Notes (Signed)
Patient ID: Khyle Goodell, male   DOB: 09-Jun-1956, 64 y.o.   MRN: [id number] S: Feels worn out after myoview. O:BP (!) 146/90 (BP Location: Left Arm)   Pulse 96   Temp 98.5 F (36.9 C) (Oral)   Resp (!) 26   Ht 5\' 3"  (1.6 m)   Wt 70 kg   SpO2 90%   BMI 27.34 kg/m  No intake or output data in the 24 hours ending 12/06/19 1238 Intake/Output: I/O last 3 completed shifts: In: 360 [P.O.:360] Out: -   Intake/Output this shift:  No intake/output data recorded. Weight change:  Gen: NAD CVS: RRR, no rub Resp: decreased BS at bases Abd: +BS, soft, NT/ND Ext: 1+ edema, RLE wrapped, s/p LBKA  Recent Labs  Lab 11/30/19 0443 12/01/19 0616 12/02/19 0435 12/03/19 0531 12/04/19 0237 12/05/19 0353  NA 145 143 141 140 140 139  K 4.7 4.6 4.5 4.3 3.9 3.8  CL 116* 111 112* 107 109 112*  CO2 18* 19* 20* 18* 19* 19*  GLUCOSE 93 124* 131* 162* 130* 128*  BUN 76* 79* 83* 87* 85* 81*  CREATININE 5.12* 5.45* 6.19* 6.28* 6.43* 6.44*  ALBUMIN  --   --   --  2.2* 2.1* 2.3*  CALCIUM 8.3* 7.9* 7.4* 7.5* 7.5* 7.5*  PHOS  --   --   --  5.7* 5.8* 5.9*   Liver Function Tests: Recent Labs  Lab 12/03/19 0531 12/04/19 0237 12/05/19 0353  ALBUMIN 2.2* 2.1* 2.3*   No results for input(s): LIPASE, AMYLASE in the last 168 hours. No results for input(s): AMMONIA in the last 168 hours. CBC: Recent Labs  Lab 12/01/19 0616 12/01/19 0616 12/02/19 0435 12/02/19 0435 12/03/19 0531 12/04/19 0237 12/05/19 0353  WBC 7.7   < > 8.0   < > 8.6 8.0 8.2  HGB 7.0*   < > 8.5*   < > 8.3* 8.0* 8.4*  HCT 22.9*   < > 26.8*   < > 26.7* 25.3* 27.7*  MCV 96.2  --  93.7  --  94.3 94.1 95.2  PLT 190   < > 173   < > 183 176 192   < > = values in this interval not displayed.   Cardiac Enzymes: No results for input(s): CKTOTAL, CKMB, CKMBINDEX, TROPONINI in the last 168 hours. CBG: Recent Labs  Lab 12/05/19 1141 12/05/19 1706 12/05/19 2121 12/06/19 0757 12/06/19 1204  GLUCAP 142* 127* 160* 121* 131*    Iron  Studies: No results for input(s): IRON, TIBC, TRANSFERRIN, FERRITIN in the last 72 hours. Studies/Results: NM CARDIAC AMYLOID TUMOR LOC INFLAM SPECT 1 DAY  Result Date: 12/05/2019 CLINICAL DATA:  64 year old male. HEART FAILURE. CONCERN FOR CARDIAC AMYLOIDOSIS. EXAM: NUCLEAR MEDICINE TUMOR LOCALIZATION. PYP CARDIAC AMYLOIDOSIS SCAN WITH SPECT TECHNIQUE: Following intravenous administration of radiopharmaceutical, anterior planar images of the chest were obtained. Regions of interest were placed on the heart and contralateral chest wall for quantitative assessment. Additional SPECT imaging of the chest was obtained. RADIOPHARMACEUTICALS:  21.2 mCi TECHNETIUM 99 PYROPHOSPHATE FINDINGS: Planar Visual assessment: Anterior planar imaging demonstrates radiotracer uptake within the heart less than uptake within the adjacent ribs (Grade 1). Quantitative assessment : Quantitative assessment of the cardiac uptake compared to the contralateral chest wall is equal to 1.0 (H/CL = 1.0). SPECT assessment: SPECT imaging of the chest demonstrates trace radiotracer accumulation within the LEFT ventricle. IMPRESSION: Visual and quantitative assessment (grade 1, H/CL equal 1.0) are NOT suggestive of transthyretin amyloidosis. Electronically Signed   By: Nicole Kindred  Leonia Reeves M.D.   On: 12/05/2019 09:11   . carvedilol  25 mg Oral BID WC  . Chlorhexidine Gluconate Cloth  6 each Topical Daily  . doxycycline  100 mg Oral Q12H  . heparin  5,000 Units Subcutaneous Q8H  . hydrALAZINE  25 mg Oral Q8H  . hydrocerin   Topical BID  . insulin aspart  0-5 Units Subcutaneous QHS  . insulin aspart  0-9 Units Subcutaneous TID WC  . isosorbide mononitrate  15 mg Oral Daily  . sodium bicarbonate  650 mg Oral TID  . sodium chloride flush  3 mL Intravenous Once  . tamsulosin  0.4 mg Oral QHS    BMET    Component Value Date/Time   NA 139 12/05/2019 0353   K 3.8 12/05/2019 0353   CL 112 (H) 12/05/2019 0353   CO2 19 (L) 12/05/2019 0353    GLUCOSE 128 (H) 12/05/2019 0353   BUN 81 (H) 12/05/2019 0353   CREATININE 6.44 (H) 12/05/2019 0353   CALCIUM 7.5 (L) 12/05/2019 0353   GFRNONAA 9 (L) 12/05/2019 0353   GFRAA 10 (L) 12/05/2019 0353   CBC    Component Value Date/Time   WBC 8.2 12/05/2019 0353   RBC 2.91 (L) 12/05/2019 0353   HGB 8.4 (L) 12/05/2019 0353   HCT 27.7 (L) 12/05/2019 0353   HCT 25.5 (L) 11/29/2019 1103   PLT 192 12/05/2019 0353   MCV 95.2 12/05/2019 0353   MCH 28.9 12/05/2019 0353   MCHC 30.3 12/05/2019 0353   RDW 17.5 (H) 12/05/2019 0353   LYMPHSABS 0.7 11/28/2019 1330   MONOABS 0.5 11/28/2019 1330   EOSABS 0.5 11/28/2019 1330   BASOSABS 0.0 11/28/2019 1330     Assessment/Plan:  1. 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  Renal US revealed chronic medical renal disease and increased echogenicity.  His BUN/Cr have continued to climb with diuresis and I have discussed RRT with Mr. Dohrman who is amenable to proceed with vascular access placement.  He will likely need to start Hd this admission and [facility] has been consulted for AVF/TDC placement on 12/09/19. Unfortunately labs were not drawn today will re-order as he is to have daily labs.  Not sure if lexiscan interfered with blood draw this am.  2. Acute combined systolic and diastolic CHF- diuresing with improved volume status.  Continue with IV lasix.  Cardiology following.  3. Cardiomyopathy with EF of 30-35%, and grade II diastolic dysfunction.  Cardiology consulted and s/p lexiscan myoview this morning. 4. Urgent HTN- with evidence of end organ damage (hypertensive heart disease and progressive CKD).  BP has improved with meds and diuresis 5. Urinary retention - s/p foley on 12/04/19 due to failed voiding trials. 6. Anemia of CKD staage IV- will start ESA and follow h/h and iron stores 7. Secondary HPTH- Phos elevated and iPTH 189.  Will start binders and follow. 8. Cellulitis of RLE- s/p  Vanc/ceftriaxone/flagyl x 3 days now on doxycycline for 10 days. 9. PAD- s/p Left BKA  10. IDDM- poorly controlled due to noncompliance.  Per primary.   Donetta Potts, MD [employer] [employer] [phone]

## 2019-12-07 LAB — CBC
HCT: 25.9 % — ABNORMAL LOW (ref 39.0–52.0)
Hemoglobin: 8 g/dL — ABNORMAL LOW (ref 13.0–17.0)
MCH: 29.4 pg (ref 26.0–34.0)
MCHC: 30.9 g/dL (ref 30.0–36.0)
MCV: 95.2 fL (ref 80.0–100.0)
Platelets: 181 10*3/uL (ref 150–400)
RBC: 2.72 MIL/uL — ABNORMAL LOW (ref 4.22–5.81)
RDW: 17.9 % — ABNORMAL HIGH (ref 11.5–15.5)
WBC: 8 10*3/uL (ref 4.0–10.5)
nRBC: 0 % (ref 0.0–0.2)

## 2019-12-07 LAB — RENAL FUNCTION PANEL
Albumin: 2.3 g/dL — ABNORMAL LOW (ref 3.5–5.0)
Anion gap: 16 — ABNORMAL HIGH (ref 5–15)
BUN: 86 mg/dL — ABNORMAL HIGH (ref 8–23)
CO2: 17 mmol/L — ABNORMAL LOW (ref 22–32)
Calcium: 7.9 mg/dL — ABNORMAL LOW (ref 8.9–10.3)
Chloride: 109 mmol/L (ref 98–111)
Creatinine, Ser: 6.72 mg/dL — ABNORMAL HIGH (ref 0.61–1.24)
GFR calc Af Amer: 9 mL/min — ABNORMAL LOW (ref >60)
GFR calc non Af Amer: 8 mL/min — ABNORMAL LOW (ref >60)
Glucose, Bld: 113 mg/dL — ABNORMAL HIGH (ref 70–99)
Phosphorus: 7 mg/dL — ABNORMAL HIGH (ref 2.5–4.6)
Potassium: 4.2 mmol/L (ref 3.5–5.1)
Sodium: 142 mmol/L (ref 135–145)

## 2019-12-07 LAB — GLUCOSE, CAPILLARY
Glucose-Capillary: 112 mg/dL — ABNORMAL HIGH (ref 70–99)
Glucose-Capillary: 161 mg/dL — ABNORMAL HIGH (ref 70–99)
Glucose-Capillary: 109 mg/dL — ABNORMAL HIGH (ref 70–99)
Glucose-Capillary: 132 mg/dL — ABNORMAL HIGH (ref 70–99)

## 2019-12-07 NOTE — Progress Notes (Signed)
Progress Note  Patient Name: [name] Date of Encounter: 12/07/2019  Primary Cardiologist: New  Subjective   No complaints  Inpatient Medications    Scheduled Meds: . carvedilol  25 mg Oral BID WC  . Chlorhexidine Gluconate Cloth  6 each Topical Daily  . doxycycline  100 mg Oral Q12H  . furosemide  40 mg Intravenous Daily  . heparin  5,000 Units Subcutaneous Q8H  . hydrALAZINE  25 mg Oral Q8H  . hydrocerin   Topical BID  . insulin aspart  0-5 Units Subcutaneous QHS  . insulin aspart  0-9 Units Subcutaneous TID WC  . isosorbide mononitrate  15 mg Oral Daily  . multivitamin  1 tablet Oral QHS  . sevelamer carbonate  800 mg Oral TID WC  . sodium bicarbonate  650 mg Oral TID  . sodium chloride flush  3 mL Intravenous Once  . tamsulosin  0.4 mg Oral QHS   Continuous Infusions:  PRN Meds:    Vital Signs    Vitals:   12/06/19 2205 12/06/19 2210 12/07/19 0512 12/07/19 0624  BP: 120/76   137/78  Pulse: 81 88    Resp: (!) 27 (!) 24    Temp: 98.2 F (36.8 C)  98.4 F (36.9 C)   TempSrc: Oral  Oral   SpO2: 95% 97%    Weight:      Height:        Intake/Output Summary (Last 24 hours) at 12/07/2019 1207 Last data filed at 12/07/2019 1123 Gross per 24 hour  Intake --  Output 1575 ml  Net -1575 ml   Last 3 Weights 12/05/2019 12/04/2019 12/03/2019  Weight (lbs) 154 lb 5.2 oz 161 lb 2.5 oz 160 lb 15 oz  Weight (kg) 70 kg 73.1 kg 73 kg      Telemetry    n/a - Personally Reviewed  ECG    n/a - Personally Reviewed  Physical Exam   GEN: No acute distress.   Neck: No JVD Cardiac: RRR, no murmurs, rubs, or gallops.  Respiratory: Clear to auscultation bilaterally. GI: Soft, nontender, non-distended  MS: No edema; No deformity. Neuro:  Nonfocal  Psych: Normal affect   Labs    High Sensitivity Troponin:  No results for input(s): TROPONINIHS in the last 720 hours.    Chemistry Recent Labs  Lab 12/05/19 0353 12/06/19 1315 12/07/19 0522  NA 139 140 142  K 3.8  4.1 4.2  CL 112* 109 109  CO2 19* 18* 17*  GLUCOSE 128* 136* 113*  BUN 81* 84* 86*  CREATININE 6.44* 6.85* 6.72*  CALCIUM 7.5* 7.9* 7.9*  ALBUMIN 2.3* 2.5* 2.3*  GFRNONAA 9* 8* 8*  GFRAA 10* 9* 9*  ANIONGAP 8 13 16*     Hematology Recent Labs  Lab 12/05/19 0353 12/06/19 1315 12/07/19 0522  WBC 8.2 9.5 8.0  RBC 2.91* 3.10* 2.72*  HGB 8.4* 9.0* 8.0*  HCT 27.7* 29.4* 25.9*  MCV 95.2 94.8 95.2  MCH 28.9 29.0 29.4  MCHC 30.3 30.6 30.9  RDW 17.5* 17.7* 17.9*  PLT 192 212 181    BNPNo results for input(s): BNP, PROBNP in the last 168 hours.   DDimer No results for input(s): DDIMER in the last 168 hours.   Radiology    NM Myocar Multi W/Spect W/Wall Motion / EF  Result Date: 12/06/2019  There was no ST segment deviation noted during stress.  Defect 1: There is a large defect of severe severity present in the basal inferolateral, mid inferolateral,  apical septal, apical lateral and apex location.  Findings consistent with ischemia.  Nuclear stress EF: 31%.  This is a high risk study.  The left ventricular ejection fraction is moderately decreased (30-44%).  No T wave inversion was noted during stress.  Large size, severe intensity apical and inferolateral perfusion defect which is reversible (SDS 7), suggestive of possible LCx territory ischemia. LVEF 31% with severe global hypokinesis and inferolateral hypokinesis to akinesis. This is a high risk study.   VAS [place] UPPER EXT VEIN MAPPING (PRE-OP AVF)  Result Date: 12/06/2019 UPPER EXTREMITY VEIN MAPPING  Indications: Pre-access. Performing Technologist: June Leap RDMS, RVT  Examination Guidelines: A complete evaluation includes B-mode imaging, spectral Doppler, color Doppler, and power Doppler as needed of all accessible portions of each vessel. Bilateral testing is considered an integral part of a complete examination. Limited examinations for reoccurring indications may be performed as noted.  +-----------------+-------------+----------+---------+ Right Cephalic   Diameter (cm)Depth (cm)Findings  +-----------------+-------------+----------+---------+ Shoulder             0.46                         +-----------------+-------------+----------+---------+ Mid upper arm        0.49                         +-----------------+-------------+----------+---------+ Dist upper arm       0.53                         +-----------------+-------------+----------+---------+ Antecubital fossa    0.59                         +-----------------+-------------+----------+---------+ Prox forearm         0.41               branching +-----------------+-------------+----------+---------+ Mid forearm          0.30                         +-----------------+-------------+----------+---------+ Dist forearm         0.29                         +-----------------+-------------+----------+---------+ +-----------------+-------------+----------+---------+ Left Cephalic    Diameter (cm)Depth (cm)Findings  +-----------------+-------------+----------+---------+ Shoulder             0.52                         +-----------------+-------------+----------+---------+ Mid upper arm        0.39                         +-----------------+-------------+----------+---------+ Dist upper arm       0.40                         +-----------------+-------------+----------+---------+ Antecubital fossa    0.54                         +-----------------+-------------+----------+---------+ Prox forearm         0.36               branching +-----------------+-------------+----------+---------+ Mid forearm          0.32                         +-----------------+-------------+----------+---------+  Dist forearm         0.30                         +-----------------+-------------+----------+---------+ *See table(s) above for measurements and observations.  Diagnosing physician:  [name] Mayo MD Electronically signed by [name] Mayo MD on 12/06/2019 at 7:19:20 PM.    Final     Cardiac Studies     Patient Profile     64 y.o. 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 Acute systolic HF - new diagnosis this admission - 10/2019 echo LVEF 30-35%, grade II Ddx, mod RV dysfunction - unclear etiology - 10/2019 nuclear stress suggests large defect of severe severity present in the basal inferolateral, mid inferolateral, apical septal, apical lateral and apex location consistent with ischemia - PYP study not consistent with TTR amyloidosis - diuresis per neprhology - medical therapy coreg 25mg  bid. hydral 25 tid, imdur 15   - will need to verify with renal plans for long term HD starting this admission, if so would consider cath Monday.  -discussed with renal, we will plan for cath next week after access for HD has been established, vascular has been consulted.    2. CKD 5 - from renal notes likely to start HD on Monday   For questions or updates, please contact [facility] Please consult [email or web address] for contact info under        Signed, Carlyle Dolly, MD  12/07/2019, 12:07 PM

## 2019-12-07 NOTE — Progress Notes (Signed)
Patient ID: [name], male   DOB: [birth year], 64 y.o.   MRN: [id number] S: Feels better today. O:BP 137/78   Pulse 88   Temp 98.4 F (36.9 C) (Oral)   Resp (!) 24   Ht 5\' 3"  (1.6 m)   Wt 70 kg   SpO2 97%   BMI 27.34 kg/m   Intake/Output Summary (Last 24 hours) at 12/07/2019 1136 Last data filed at 12/07/2019 1123 Gross per 24 hour  Intake --  Output 1575 ml  Net -1575 ml   Intake/Output: I/O last 3 completed shifts: In: -  Out: 805 [Urine:805]  Intake/Output this shift:  Total I/O In: -  Out: 1575 [Urine:1575] Weight change:  Gen: NAD CVS: no rub Resp: CTA Abd: +BS, soft, NT/ND Ext:1+ edema of left leg and right stump of BKA  Recent Labs  Lab 12/01/19 0616 12/02/19 0435 12/03/19 0531 12/04/19 0237 12/05/19 0353 12/06/19 1315 12/07/19 0522  NA 143 141 140 140 139 140 142  K 4.6 4.5 4.3 3.9 3.8 4.1 4.2  CL 111 112* 107 109 112* 109 109  CO2 19* 20* 18* 19* 19* 18* 17*  GLUCOSE 124* 131* 162* 130* 128* 136* 113*  BUN 79* 83* 87* 85* 81* 84* 86*  CREATININE 5.45* 6.19* 6.28* 6.43* 6.44* 6.85* 6.72*  ALBUMIN  --   --  2.2* 2.1* 2.3* 2.5* 2.3*  CALCIUM 7.9* 7.4* 7.5* 7.5* 7.5* 7.9* 7.9*  PHOS  --   --  5.7* 5.8* 5.9* 6.6* 7.0*   Liver Function Tests: Recent Labs  Lab 12/05/19 0353 12/06/19 1315 12/07/19 0522  ALBUMIN 2.3* 2.5* 2.3*   No results for input(s): LIPASE, AMYLASE in the last 168 hours. No results for input(s): AMMONIA in the last 168 hours. CBC: Recent Labs  Lab 12/03/19 0531 12/03/19 0531 12/04/19 0237 12/04/19 0237 12/05/19 0353 12/06/19 1315 12/07/19 0522  WBC 8.6   < > 8.0   < > 8.2 9.5 8.0  HGB 8.3*   < > 8.0*   < > 8.4* 9.0* 8.0*  HCT 26.7*   < > 25.3*   < > 27.7* 29.4* 25.9*  MCV 94.3  --  94.1  --  95.2 94.8 95.2  PLT 183   < > 176   < > 192 212 181   < > = values in this interval not displayed.   Cardiac Enzymes: No results for input(s): CKTOTAL, CKMB, CKMBINDEX, TROPONINI in the last 168 hours. CBG: Recent Labs  Lab  12/06/19 0757 12/06/19 1204 12/06/19 1720 12/06/19 2206 12/07/19 0810  GLUCAP 121* 131* 139* 133* 112*    Iron Studies: No results for input(s): IRON, TIBC, TRANSFERRIN, FERRITIN in the last 72 hours. Studies/Results: NM Myocar Multi W/Spect W/Wall Motion / EF  Result Date: 12/06/2019  There was no ST segment deviation noted during stress.  Defect 1: There is a large defect of severe severity present in the basal inferolateral, mid inferolateral, apical septal, apical lateral and apex location.  Findings consistent with ischemia.  Nuclear stress EF: 31%.  This is a high risk study.  The left ventricular ejection fraction is moderately decreased (30-44%).  No T wave inversion was noted during stress.  Large size, severe intensity apical and inferolateral perfusion defect which is reversible (SDS 7), suggestive of possible LCx territory ischemia. LVEF 31% with severe global hypokinesis and inferolateral hypokinesis to akinesis. This is a high risk study.   VAS [place] UPPER EXT VEIN MAPPING (PRE-OP AVF)  Result Date: 12/06/2019 UPPER EXTREMITY  VEIN MAPPING  Indications: Pre-access. Performing Technologist: June Leap RDMS, RVT  Examination Guidelines: A complete evaluation includes B-mode imaging, spectral Doppler, color Doppler, and power Doppler as needed of all accessible portions of each vessel. Bilateral testing is considered an integral part of a complete examination. Limited examinations for reoccurring indications may be performed as noted. +-----------------+-------------+----------+---------+ Right Cephalic   Diameter (cm)Depth (cm)Findings  +-----------------+-------------+----------+---------+ Shoulder             0.46                         +-----------------+-------------+----------+---------+ Mid upper arm        0.49                         +-----------------+-------------+----------+---------+ Dist upper arm       0.53                          +-----------------+-------------+----------+---------+ Antecubital fossa    0.59                         +-----------------+-------------+----------+---------+ Prox forearm         0.41               branching +-----------------+-------------+----------+---------+ Mid forearm          0.30                         +-----------------+-------------+----------+---------+ Dist forearm         0.29                         +-----------------+-------------+----------+---------+ +-----------------+-------------+----------+---------+ Left Cephalic    Diameter (cm)Depth (cm)Findings  +-----------------+-------------+----------+---------+ Shoulder             0.52                         +-----------------+-------------+----------+---------+ Mid upper arm        0.39                         +-----------------+-------------+----------+---------+ Dist upper arm       0.40                         +-----------------+-------------+----------+---------+ Antecubital fossa    0.54                         +-----------------+-------------+----------+---------+ Prox forearm         0.36               branching +-----------------+-------------+----------+---------+ Mid forearm          0.32                         +-----------------+-------------+----------+---------+ Dist forearm         0.30                         +-----------------+-------------+----------+---------+ *See table(s) above for measurements and observations.  Diagnosing physician: [name] Mayo MD Electronically signed by [name] Mayo MD on 12/06/2019 at 7:19:20 PM.    Final    . carvedilol  25 mg Oral BID WC  .  Chlorhexidine Gluconate Cloth  6 each Topical Daily  . doxycycline  100 mg Oral Q12H  . furosemide  40 mg Intravenous Daily  . heparin  5,000 Units Subcutaneous Q8H  . hydrALAZINE  25 mg Oral Q8H  . hydrocerin   Topical BID  . insulin aspart  0-5 Units Subcutaneous QHS  . insulin  aspart  0-9 Units Subcutaneous TID WC  . isosorbide mononitrate  15 mg Oral Daily  . multivitamin  1 tablet Oral QHS  . sevelamer carbonate  800 mg Oral TID WC  . sodium bicarbonate  650 mg Oral TID  . sodium chloride flush  3 mL Intravenous Once  . tamsulosin  0.4 mg Oral QHS    BMET    Component Value Date/Time   NA 142 12/07/2019 0522   K 4.2 12/07/2019 0522   CL 109 12/07/2019 0522   CO2 17 (L) 12/07/2019 0522   GLUCOSE 113 (H) 12/07/2019 0522   BUN 86 (H) 12/07/2019 0522   CREATININE 6.72 (H) 12/07/2019 0522   CALCIUM 7.9 (L) 12/07/2019 0522   GFRNONAA 8 (L) 12/07/2019 0522   GFRAA 9 (L) 12/07/2019 0522   CBC    Component Value Date/Time   WBC 8.0 12/07/2019 0522   RBC 2.72 (L) 12/07/2019 0522   HGB 8.0 (L) 12/07/2019 0522   HCT 25.9 (L) 12/07/2019 0522   HCT 25.5 (L) 11/29/2019 1103   PLT 181 12/07/2019 0522   MCV 95.2 12/07/2019 0522   MCH 29.4 12/07/2019 0522   MCHC 30.9 12/07/2019 0522   RDW 17.9 (H) 12/07/2019 0522   LYMPHSABS 0.7 11/28/2019 1330   MONOABS 0.5 11/28/2019 1330   EOSABS 0.5 11/28/2019 1330   BASOSABS 0.0 11/28/2019 1330    Assessment/Plan:  1. 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  Renal US revealed chronic medical renal disease and increased echogenicity.  His BUN/Cr have continued to climb with diuresis and I have discussed RRT with Mr. Mcfarren who is amenable to proceed with vascular access placement.   1. He will likely need to start Hd this admission and [facility] has been consulted for AVF/TDC placement on 12/09/19.  2. Acute combined systolic and diastolic CHF- diuresing with improved volume status.  Continue with IV lasix.  Cardiology following.  3. Cardiomyopathy with EF of 30-35%, and grade II diastolic dysfunction.  Cardiology consulted 1. lexiscan myoview revealed reversible ischemia in LCx territory 2. Will likely require cardiac cath, awaiting Cardiology  input/plan. 4. Urgent HTN- with evidence of end organ damage (hypertensive heart disease and progressive CKD).  BP has improved with meds and diuresis 5. Urinary retention - s/p foley on 12/04/19 due to failed voiding trials. 6. Anemia of CKD staage IV- will start ESA and follow h/h and iron stores 7. Secondary HPTH- Phos elevated and iPTH 189.  Will start binders and follow. 8. Cellulitis of RLE- s/p Vanc/ceftriaxone/flagyl x 3 days now on doxycycline for 10 days. 9. PAD- s/p Left BKA  10. IDDM- poorly controlled due to noncompliance.  Per primary.   Donetta Potts, MD [employer] [employer] [phone]

## 2019-12-07 NOTE — Progress Notes (Signed)
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  Subjective/Interm history  No overnight events. Discussed results of lexiscan. No complaints this morning.  Significant Hospital Events   4/29 hospital admission 4/30 ortho and neph consulted. Sodium bicarb started 5/1 40mg  lasix>1.6L out. Lopressor started. 5/2 transfused 1U PRBC for hgb of 7. Vanc/rocephin/flagyl stopped>transitioned to doxy. flomax started for urinary retention.  5/3 hgb 8.5. renal function continuing to decline 5/4 cr continuing to rise, cardiology consulted--hydral/imdur started, metop>coreg 5/5 no significant changes. Foley removed however was retaining so foley replaced.  5/6 creatinine plateauing. vasc consulted for permanent access. 5/7 continued renal decline. Creatinine now 6.85. lexiscan, vein mapping. Planning to obtain access for HD on Monday 5/8 no significant events  Objective   Blood pressure 137/78, pulse 88, temperature 98.4 F (36.9 C), temperature source Oral, resp. rate (!) 24, height 5\' 3"  (1.6 m), weight 70 kg, SpO2 97 %.     Intake/Output Summary (Last 24 hours) at 12/07/2019 0812 Last data filed at 12/06/2019 0815 Gross per 24 hour  Intake --  Output 805 ml  Net -805 ml   Filed Weights   12/03/19 0519 12/04/19 0608 12/05/19 0412  Weight: 73 kg 73.1 kg 70 kg    Examination: GENERAL: in no acute distress CARDIAC: heart RRR.  +1 RLE edema. PULMONARY: breathing comfortably on one liter [place]. diminished lung sounds at the bases SKIN: UNA boot to RLE GU: foley  Consults:  Orthopedics-signed off Nephology  Cardiology Vascular surgery Significant Diagnostic Tests:  4/29 R foot MRI>>no evidence of loculated fluid collection or  osteomyelitis  4/29 CXR>>b/l effusions with basilar atelectasis vs pna; vascular congestion with diffuse interstitial opacities suspicious for pulm edema  4/30 CT renal study>>no acute abnormalities in abd/pelvis.  4/30 echocardiogram>>Left ventricular ejection fraction, by estimation, is 30 to 35%. The left ventricle has moderately decreased function. The left ventricle demonstrates global hypokinesis. Diffuse hypokinesis worse in the inferior base .  The left ventricular internal cavity size was mildly dilated. There is moderate left ventricular hypertrophy. Left ventricular diastolic parameters are consistent with Grade II diastolic dysfunction (pseudonormalization). Elevated left ventricular end-diastolic pressure. Right ventricular systolic function is moderately reduced. The right ventricular size is mildly enlarged. The mitral valve is normal in structure. Mild mitral valve egurgitation. No evidence of mitral stenosis. The aortic valve is tricuspid. Aortic valve regurgitation is not  visualized. Mild aortic valve sclerosis is present, with no evidence of aortic valve stenosis. The inferior vena cava is dilated in size with <50% respiratory  variability, suggesting right atrial pressure of 15 mmHg.   5/5 cardiac amyloid scan>not suggestive of amyloid 5/7 lexiscan>large severe apical and inferolateral perfusion defect which is reversible suggestive of possible LCx territory ischemia. LVEF 31% with severe global hypokinesia and inferolateral hypokinesis to akinesis.  Micro Data:  4/29 blood cultures>>NG 5/2 urine cultures>>NG  Antimicrobials:  Vancomycin 4/29>>5/2 Rocephin 4/29>>5/2 Flagyl 4/29>>5/2 Doxycycline 5/2>>  Summary  64 yo male with T2DM and HTN who was admitted to IMTS on 4/29 for purulent cellulitis of RLE.  Resolved/Stable hospital problems  Bacturia-received 3d course of vanc/rocephin/flagyl. No growth on urine culture  Borderline gamma gap--4. SPEP/UPEP  normal Normocytic anemia Balance issues/deconditioning. Likely 2/2 right foot  cellulitis. PT recommending SNF at this time. Assessment & Plan:  Principal Problem:   Type 2 diabetes mellitus with diabetic foot infection ([place]) Active Problems:   Renal failure   Anemia   Uncontrolled hypertension   CKD (chronic kidney disease), stage V (HCC)   Shortness of breath   Pressure injury of skin  Nonpurulent cellulitis of the RLE in the setting of PAD, venous/lymphatic insufficiency and T2DM Plan: continue doxycycline--day #10/10. UNA boot  CKD V with metabolic acidosis. Likely due to poorly controlled DM and HTN along with CHF -continues to have decent UOP -bicarb trending down slightly. Plan Management per nephrology -underwent vein mapping 5/7 for plans for AVF/AVG +/- TDC on Monday  Sodium bicarb 650mg  tid  Strict I/O, daily labs  Urinary retention. Failed voiding trial 5/5 so foley replaced. Continue foley and flomax  Newly diagnosed Combined heart failure. ICM EF 30-35% with global LV hypokinesis and G2DD. lexiscan showing severe inferolateral and apical defects. Deferring heart cath due to renal function Total net -1.8L.  Wt -2.2 kg from admission Uncontrolled hypertension. Blood pressures stable PAD. As indicated on 5/4 ABI Plan -management per cardiology. continue hydral, imdur, coreg. uptitrate as BP/HR tolerates -Continuous tele monitoring, strict I/O, daily weights, fluid restriction  T2DM. Sugars stable. Continue SSI  Best practice:  CODE STATUS: full Diet: cardiac, renal DVT for prophylaxis: heparin Dispo: pending further medical management. Ortho follow up at discharge. Sleep study and palliative care referral at discharge. PT recommending SNF at this time   [name], MD [place] PGY-1 PAGER #: [phone] 12/07/19  8:12 AM  Labs    CBC Latest Ref Rng & Units 12/07/2019 12/06/2019 12/05/2019  WBC 4.0 - 10.5 K/uL 8.0 9.5 8.2  Hemoglobin  13.0 - 17.0 g/dL 8.0(L) 9.0(L) 8.4(L)  Hematocrit 39.0 - 52.0 % 25.9(L) 29.4(L) 27.7(L)  Platelets 150 - 400 K/uL 181 212 192   BMP Latest Ref Rng & Units 12/07/2019 12/06/2019 12/05/2019  Glucose 70 - 99 mg/dL 113(H) 136(H) 128(H)  BUN 8 - 23 mg/dL 86(H) 84(H) 81(H)  Creatinine 0.61 - 1.24 mg/dL 6.72(H) 6.85(H) 6.44(H)  Sodium 135 - 145 mmol/L 142 140 139  Potassium 3.5 - 5.1 mmol/L 4.2 4.1 3.8  Chloride 98 - 111 mmol/L 109 109 112(H)  CO2 22 - 32 mmol/L 17(L) 18(L) 19(L)  Calcium 8.9 - 10.3 mg/dL 7.9(L) 7.9(L) 7.5(L)

## 2019-12-07 NOTE — Progress Notes (Signed)
Physical Therapy Treatment Patient Details Name: [name] MRN: [id number] DOB: [birth year] Today's Date: 12/07/2019    History of Present Illness 64 year old man with PMH of left AKA and partial right foot amputation, no medical care for at least five years, who presented with cellulitis of the right foot and renal insufficiency. Also found to have bilateral pleural effusions Admitted 11/28/19 for treatment of cellulitis via IV antibiotics and determination of AKIvsCKD. WBAT on R LE    PT Comments    Pt limited participant in session, refusing all ambulation and only transferring to and from bed/bedside commode. Pt with impaired safety awareness, refusing PT physical assistance multiple times despite nearly falling during squat pivot transfer back to bed. Performance during today's session seems to be uncharacteristic of pt's performance with PT services to date during admission. Pt continues to recommend home with HHPT and family assistance, however if pt continues to demonstrate impaired safety awareness and increased falls risk then may need to consider SNF. Pt will need stair negotiation assessment as he has 7 steps to negotiate to enter home.  Follow Up Recommendations  Home health PT(pt may need SNF if continues to demonstrate safety deficits)     Equipment Recommendations  None recommended by PT;Other (comment)    Recommendations for Other Services       Precautions / Restrictions Precautions Precautions: Fall;Other (comment) Precaution Comments: Left AKA - LLE prosthesis Restrictions Weight Bearing Restrictions: No Other Position/Activity Restrictions: WBAT RLE    Mobility  Bed Mobility Overal bed mobility: Needs Assistance Bed Mobility: Supine to Sit     Supine to sit: Modified independent (Device/Increase time);HOB elevated        Transfers Overall transfer level: Needs assistance Equipment used: None Transfers: Set designer Transfers;Lateral/Scoot Transfers      Squat pivot transfers: Min assist    Lateral/Scoot Transfers: Min guard General transfer comment: lateral scoot from bed to bedside commode, pt refusing physical assistance, PT provides line management. Pt attempts to perform squat pivot from bedside commode to bed, PT provides minA despite pt displeasure, pt nearly falling during transfer as he attempts to transfer to amputated side without assistive device  Ambulation/Gait                 Stairs             Wheelchair Mobility    Modified Rankin (Stroke Patients Only)       Balance Overall balance assessment: Needs assistance Sitting-balance support: No upper extremity supported;Feet supported Sitting balance-Leahy Scale: Good Sitting balance - Comments: supervision                                    Cognition Arousal/Alertness: Awake/alert Behavior During Therapy: Impulsive Overall Cognitive Status: Impaired/Different from baseline Area of Impairment: Safety/judgement                         Safety/Judgement: Decreased awareness of safety;Decreased awareness of deficits            Exercises      General Comments General comments (skin integrity, edema, etc.): VSS, pt refusing ambulation      Pertinent Vitals/Pain Pain Assessment: Faces Faces Pain Scale: No hurt    Home Living                      Prior Function  PT Goals (current goals can now be found in the care plan section) Acute Rehab PT Goals Patient Stated Goal: to get back to being independent Progress towards PT goals: Not progressing toward goals - comment(pt refusing ambulation)    Frequency    Min 3X/week      PT Plan Discharge plan needs to be updated    Co-evaluation              AM-PAC PT "6 Clicks" Mobility   Outcome Measure  Help needed turning from your back to your side while in a flat bed without using bedrails?: None Help needed moving from lying on  your back to sitting on the side of a flat bed without using bedrails?: None Help needed moving to and from a bed to a chair (including a wheelchair)?: A Little Help needed standing up from a chair using your arms (e.g., wheelchair or bedside chair)?: A Little Help needed to walk in hospital room?: A Little Help needed climbing 3-5 steps with a railing? : A Little 6 Click Score: 20    End of Session Equipment Utilized During Treatment: Oxygen Activity Tolerance: Treatment limited secondary to agitation Patient left: in bed;with call bell/phone within reach(RN declining need for bed alarm, pt refused previously) Nurse Communication: Mobility status PT Visit Diagnosis: Unsteadiness on feet (R26.81);Other abnormalities of gait and mobility (R26.89);Muscle weakness (generalized) (M62.81);Difficulty in walking, not elsewhere classified (R26.2)     Time: 6761-9509 PT Time Calculation (min) (ACUTE ONLY): 18 min  Charges:  $Therapeutic Activity: 8-22 mins                     Zenaida Niece, PT, DPT Acute Rehabilitation Pager: [phone]    Zenaida Niece 12/07/2019, 5:06 PM

## 2019-12-07 NOTE — Discharge Summary (Addendum)
Name: [name] MRN: [id number] DOB: June 11, 1956 64 y.o. PCP: Patient, No Pcp Per  Date of Admission: 11/28/2019  1:16 PM Date of Discharge: 12/17/19 Attending Physician: Sid Falcon, MD  Discharge Diagnosis: 1.  End-stage renal disease on HD (new diagnosis) 2.  Ischemic and hypertensive cardiomyopathy, combined heart failure (EF 30 to 35% with global left ventricle hypokinesis and grade 2 diastolic dysfunction) (new diagnosis) 3.  Uncontrolled hypertension 4.  Peripheral artery disease 5.  Nonpurulent cellulitis of the right lower extremity-resolved   Discharge Medications: Allergies as of 12/17/2019      Reactions   Tramadol Nausea And Vomiting   Pt states he took this on an empty stomach one time and it came right back up. He's not had it since.       Medication List    STOP taking these medications   HYDROcodone-acetaminophen 5-325 MG tablet Commonly known as: Norco   multivitamin with minerals Tabs tablet     TAKE these medications   atorvastatin 20 MG tablet Commonly known as: LIPITOR Take 1 tablet (20 mg total) by mouth daily.   carvedilol 25 MG tablet Commonly known as: COREG Take 1 tablet (25 mg total) by mouth 2 (two) times daily with a meal.   hydrocerin Crea Apply 1 application topically 2 (two) times daily.   isosorbide mononitrate 30 MG 24 hr tablet Commonly known as: IMDUR Take 0.5 tablets (15 mg total) by mouth daily.   multivitamin Tabs tablet Take 1 tablet by mouth at bedtime.   sevelamer carbonate 800 MG tablet Commonly known as: RENVELA Take 2 tablets (1,600 mg total) by mouth 3 (three) times daily with meals.   tamsulosin 0.4 MG Caps capsule Commonly known as: FLOMAX Take 1 capsule (0.4 mg total) by mouth at bedtime.       Disposition and follow-up:   [name] was discharged from [facility] in Stable condition.  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 Presented  with CKD 5 which progressed to being ESRD. Tunneled HD cath and AVF formed on 5/12. Began HD on 5/14. -set up on a TTS schedule at [place]. Anemia of CKD. Receiving ESA at HD  Combined heart failure. Presented in heart failure without a previous diagnosis of cardiac issues.  Echocardiogram revealed  EF of 30 to 35% and grade 2 diastolic dysfunction.   Ischemic cardiomyopathy. Lexiscan revealing perfusion defects in the anterolateral and apical regions.  Moderate CAD. Underwent LHC on 5/14 which showed moderate CAD. Uncontrolled hypertension. Presented with severely elevated blood pressures that have normalized over his hospitalization. Peripheral artery disease.  ABI during this hospitalization revealed noncompressible arteries in his right leg.  Prior BKA on the left. -New medications: 20mg  lipitor (increase in 4-6w), imdur 15mg  daily, and coreg 25mg  bid -f/u with cardiology  Urinary Retention.  -continue flomax. Consider further evaluation outpatient.  History of Type II DM. A1C 5.5 on admission. CBGs were fairly good during admission and was on very minimal (1U) of novolog SSI intermittently. -will hold off on discharging him with any glycemic control. Please evaluate once outpatient and resuming a more regular diet/lifestyle  Nonpurulent cellulitis of the right foot. Resolved. Completed 10-day course of treatment.   -f/u with orthopedics at discharge  2.  Labs / imaging needed at time of follow-up: none  3.  Pending labs/ test needing follow-up: none  Follow-up Appointments:  Contact information for follow-up providers  Newt Minion, MD Follow up in 1 week(s).   Specialty: Orthopedic Surgery Contact information: [place] [address] [phone]        [facility] In 6 weeks.   Specialty: Vascular Surgery Why: Office will call you to arrange your appt (sent) Contact information: [place], [place] Follow up.   Why: Tuesday/Thursday/Saturday schedule, 11:20am. Arrive to appointments at 11:00am. On the 1st day at the clinic, you need to arrive at 10:15am to complete intake paperwork prior to first treatment at the clinic. Contact information: [place] [address] [place] Follow up.   Contact information: [place] [address] [phone]           Contact information for after-discharge care    Destination    [facility] Preferred SNF .   Service: Skilled Nursing Contact information: [address] [place] [facility] Hospital Course: [name] is a 64 year old male with a previously documented history of hypertension and type 2 diabetes who presented to [facility] emergency department on 4/29 for evaluation of a painful right lower extremity and found to have nonpurulent cellulitis.  He was also grossly volume overloaded at that time and echocardiogram revealed combined heart failure with an EF of 30 to 35% and grade 2 diastolic dysfunction.  He was also found to have an elevated creatinine level.  He was treated with a 10-day course of antibiotics for the cellulitis of the right lower extremity.  Blood cultures had been negative on admission.  He was afebrile and had no other sign of systemic infection.  We did obtain ABIs for evaluation of blood flow and they did reveal noncompressible arteries of that right leg.  Upon admission he was also found to be grossly volume overloaded and so an echocardiogram was obtained with the findings as stated above.  He was also severely hypertensive at that time with his blood pressure being around 902 systolically.  He was placed on hydralazine, Imdur, and Coreg and his blood  pressure is improved over his hospitalization. Blood pressures improved with HD and medical management. Cardiology was consulted and wished to pursue an ischemic work-up however his renal function was precluding a cath.  They obtained a Lexiscan initially which revealed perfusion defects involving the inferolateral and apical indices. He later underwent a LHC which revealed moderate CAD.   He was also found to have a severely elevated creatinine level on admission.  Renal ultrasound was consistent with medical renal disease.  His renal function continued decline over his hospitalization and nephrology was consulted.  They monitored his renal function for a few days however as renal function continue to decline they began to prepare for him becoming dialysis dependent.  He did not exhibit uremic symptoms during his hospitalization however he was grossly volume overloaded.  Vascular surgery was consulted and patient underwent placement of a TDC and AVF formation on 5/12. He was also found to be anemic which is likely attributable to his CKD. Nephrology has started ESA with HD. He was also found to have urinary retention during his hospitalization requiring extended time with foley. This, fortunately, resolved with starting flomax.  He also has  known history of type 2 diabetes however had been noncompliant with medication management over the past 5 years.  A1c on admission was only 5.5 and he had a very minimal insulin requirement while inpatient.    Antibiotic use:  Vancomycin 4/29>>5/2 Rocephin 4/29>>5/2 Flagyl 4/29>>5/2 Doxycycline 5/2>>5/8   Discharge Vitals:   BP 126/69 (BP Location: Right Arm)   Pulse 79   Temp 98.2 F (36.8 C) (Oral)   Resp (!) 25   Ht 5\' 3"  (1.6 m)   Wt 71.7 kg   SpO2 100%   BMI 28.00 kg/m   Pertinent Labs, Studies, and Procedures:  4/29 R foot MRI>>no evidence of loculated fluid collection or osteomyelitis  4/29 CXR>>b/l effusions with basilar atelectasis vs pna;  vascular congestion with diffuse interstitial opacities suspicious for pulm edema  4/30 CT renal study>>no acute abnormalities in abd/pelvis.  4/30 echocardiogram>>Left ventricular ejection fraction, by estimation, is 30 to 35%. The left ventricle has moderately decreased function. The left ventricle demonstrates global hypokinesis. Diffuse hypokinesis worse in the inferior base .  The left ventricular internal cavity size was mildly dilated. There is moderate left ventricular hypertrophy. Left ventricular diastolic parameters are consistent with Grade II diastolic dysfunction (pseudonormalization). Elevated left ventricular end-diastolic pressure. Right ventricular systolic function is moderately reduced. The right ventricular size is mildly enlarged. The mitral valve is normal in structure. Mild mitral valve egurgitation. No evidence of mitral stenosis. The aortic valve is tricuspid. Aortic valve regurgitation is not visualized. Mild aortic valve sclerosis is present, with no evidence of aortic valve stenosis. The inferior vena cava is dilated in size with <50% respiratory variability, suggesting right atrial pressure of 15 mmHg.   5/5 cardiac amyloid scan>not suggestive of amyloid 5/7 lexiscan>large severe apical and inferolateral perfusion defect which is reversible suggestive of possible LCx territory ischemia. LVEF 31% with severe global hypokinesia and inferolateral hypokinesis to akinesis. 5/14 LHC> proximal to mid circumflex 70% stenosis, mid LAD 75%, distal LAD 50% and 90% stenosis, LV end-diastolic pressure moderately elevated, mean pulmonary artery pressure 36, wedge pressure 23, cardiac output 7  CBC Latest Ref Rng & Units 12/15/2019 12/13/2019 12/13/2019  WBC 4.0 - 10.5 K/uL 8.5 8.6 -  Hemoglobin 13.0 - 17.0 g/dL 8.0(L) 7.7(L) 7.1(L)  Hematocrit 39.0 - 52.0 % 25.7(L) 25.3(L) 21.0(L)  Platelets 150 - 400 K/uL 131(L) 131(L) -   BMP Latest Ref Rng & Units 12/17/2019 12/16/2019 12/15/2019    Glucose 70 - 99 mg/dL 134(H) 112(H) 113(H)  BUN 8 - 23 mg/dL 70(H) 57(H) 46(H)  Creatinine 0.61 - 1.24 mg/dL 6.86(H) 5.95(H) 4.71(H)  Sodium 135 - 145 mmol/L 137 137 139  Potassium 3.5 - 5.1 mmol/L 4.6 4.1 4.0  Chloride 98 - 111 mmol/L 99 102 102  CO2 22 - 32 mmol/L 24 23 26   Calcium 8.9 - 10.3 mg/dL 7.6(L) 7.8(L) 8.1(L)   Discharge Instructions    Amb Referral to Palliative Care   Complete by: As directed       Signed: Asencion Noble, MD 12/17/2019, 7:42 AM   Pager: [phone]

## 2019-12-08 DIAGNOSIS — R0902 Hypoxemia: Secondary | ICD-10-CM | POA: Diagnosis present

## 2019-12-08 LAB — CBC
HCT: 26.7 % — ABNORMAL LOW (ref 39.0–52.0)
Hemoglobin: 8.3 g/dL — ABNORMAL LOW (ref 13.0–17.0)
MCH: 29.5 pg (ref 26.0–34.0)
MCHC: 31.1 g/dL (ref 30.0–36.0)
MCV: 95 fL (ref 80.0–100.0)
Platelets: 193 10*3/uL (ref 150–400)
RBC: 2.81 MIL/uL — ABNORMAL LOW (ref 4.22–5.81)
RDW: 17.6 % — ABNORMAL HIGH (ref 11.5–15.5)
WBC: 9.6 10*3/uL (ref 4.0–10.5)
nRBC: 0 % (ref 0.0–0.2)

## 2019-12-08 LAB — RENAL FUNCTION PANEL
Albumin: 2.3 g/dL — ABNORMAL LOW (ref 3.5–5.0)
Anion gap: 16 — ABNORMAL HIGH (ref 5–15)
BUN: 91 mg/dL — ABNORMAL HIGH (ref 8–23)
CO2: 18 mmol/L — ABNORMAL LOW (ref 22–32)
Calcium: 8.2 mg/dL — ABNORMAL LOW (ref 8.9–10.3)
Chloride: 108 mmol/L (ref 98–111)
Creatinine, Ser: 6.67 mg/dL — ABNORMAL HIGH (ref 0.61–1.24)
GFR calc Af Amer: 9 mL/min — ABNORMAL LOW (ref >60)
GFR calc non Af Amer: 8 mL/min — ABNORMAL LOW (ref >60)
Glucose, Bld: 119 mg/dL — ABNORMAL HIGH (ref 70–99)
Phosphorus: 7 mg/dL — ABNORMAL HIGH (ref 2.5–4.6)
Potassium: 4.4 mmol/L (ref 3.5–5.1)
Sodium: 142 mmol/L (ref 135–145)

## 2019-12-08 LAB — GLUCOSE, CAPILLARY
Glucose-Capillary: 113 mg/dL — ABNORMAL HIGH (ref 70–99)
Glucose-Capillary: 167 mg/dL — ABNORMAL HIGH (ref 70–99)
Glucose-Capillary: 120 mg/dL — ABNORMAL HIGH (ref 70–99)
Glucose-Capillary: 139 mg/dL — ABNORMAL HIGH (ref 70–99)

## 2019-12-08 NOTE — Progress Notes (Signed)
No additional cardiology recs. We discussed with nephrology, plans for placement of dialysis access and initiation of HD this admission. We will plan for cath this admit once this has been completed, vascular has been consulted and appears set for placement tomorrow. Potential cath Tuesday.   Zandra Abts MD

## 2019-12-08 NOTE — Progress Notes (Signed)
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  Subjective/Interm history  No overnight events  Significant Hospital Events   4/29 hospital admission 4/30 ortho and neph consulted. Sodium bicarb started 5/1 40mg  lasix>1.6L out. Lopressor started. 5/2 transfused 1U PRBC for hgb of 7. Vanc/rocephin/flagyl stopped>transitioned to doxy. flomax started for urinary retention.  5/3 hgb 8.5. renal function continuing to decline 5/4 cr continuing to rise, cardiology consulted--hydral/imdur started, metop>coreg 5/5 no significant changes. Foley removed however was retaining so foley replaced.  5/6 creatinine plateauing. vasc consulted for permanent access. 5/7 continued renal decline. Creatinine now 6.85. lexiscan, vein mapping. Planning to obtain access for HD on Monday 5/8 no significant events 5/9 no significant changes  Objective   Blood pressure (!) 152/83, pulse (!) 101, temperature 97.9 F (36.6 C), temperature source Oral, resp. rate (!) 31, height 5\' 3"  (1.6 m), weight 70 kg, SpO2 95 %.     Intake/Output Summary (Last 24 hours) at 12/08/2019 0555 Last data filed at 12/08/2019 0554 Gross per 24 hour  Intake --  Output 2325 ml  Net -2325 ml   Filed Weights   12/03/19 0519 12/04/19 0608 12/05/19 0412  Weight: 73 kg 73.1 kg 70 kg    Examination: GENERAL: in no acute distress CARDIAC: heart RRR.   PULMONARY: lung sounds diminished at the bases. SKIN: UNA boot to RLE GU: foley  Consults:  Orthopedics-signed off Nephology  Cardiology Vascular surgery Significant Diagnostic Tests:  4/29 R foot MRI>>no evidence of loculated fluid collection or osteomyelitis  4/29 CXR>>b/l effusions with basilar atelectasis vs pna; vascular  congestion with diffuse interstitial opacities suspicious for pulm edema  4/30 CT renal study>>no acute abnormalities in abd/pelvis.  4/30 echocardiogram>>Left ventricular ejection fraction, by estimation, is 30 to 35%. The left ventricle has moderately decreased function. The left ventricle demonstrates global hypokinesis. Diffuse hypokinesis worse in the inferior base .  The left ventricular internal cavity size was mildly dilated. There is moderate left ventricular hypertrophy. Left ventricular diastolic parameters are consistent with Grade II diastolic dysfunction (pseudonormalization). Elevated left ventricular end-diastolic pressure. Right ventricular systolic function is moderately reduced. The right ventricular size is mildly enlarged. The mitral valve is normal in structure. Mild mitral valve egurgitation. No evidence of mitral stenosis. The aortic valve is tricuspid. Aortic valve regurgitation is not  visualized. Mild aortic valve sclerosis is present, with no evidence of aortic valve stenosis. The inferior vena cava is dilated in size with <50% respiratory  variability, suggesting right atrial pressure of 15 mmHg.   5/5 cardiac amyloid scan>not suggestive of amyloid 5/7 lexiscan>large severe apical and inferolateral perfusion defect which is reversible suggestive of possible LCx territory ischemia. LVEF 31% with severe global hypokinesia and inferolateral hypokinesis to akinesis.  Micro Data:  4/29 blood cultures>>NG 5/2 urine cultures>>NG  Antimicrobials:  Vancomycin 4/29>>5/2 Rocephin 4/29>>5/2 Flagyl 4/29>>5/2 Doxycycline 5/2>>5/8  Summary  64 yo male with T2DM and HTN who was admitted to [facility]MTS on 4/29 for purulent cellulitis of RLE.  Resolved/Stable hospital problems  Bacturia-received 3d course of vanc/rocephin/flagyl. No growth on urine culture RLE cellulitis. Completed 10d course of abx treatment   Borderline gamma gap--4. SPEP/UPEP normal Normocytic anemia Balance  issues/deconditioning. Likely 2/2 right foot cellulitis.  PT recommending SNF at this time. Assessment & Plan:  Principal Problem:   Type 2 diabetes mellitus with diabetic foot infection ([place]) Active Problems:   Renal failure   Anemia   Uncontrolled hypertension   CKD (chronic kidney disease), stage V (HCC)   Shortness of breath   Pressure injury of skin   CKD V with metabolic acidosis. Likely due to poorly controlled DM and HTN along with CHF -bicarb trending down slightly. Plan Management per nephrology -underwent vein mapping 5/7 for plans for AVF/AVG +/- TDC next week Sodium bicarb 650mg  tid  Strict I/O, daily labs  Urinary retention. Failed voiding trial 5/5 so foley replaced. Continue foley and flomax  Newly diagnosed Combined heart failure. ICM EF 30-35% with global LV hypokinesis and G2DD. lexiscan showing severe inferolateral and apical defects. Deferring heart cath due to renal function Total net -3.6L.   Uncontrolled hypertension. Blood pressures stable PAD. As indicated on 5/4 ABI Plan -management per cardiology. continue hydral, imdur, coreg. uptitrate as BP/HR tolerates. Lasix guided by cardiology. -planning for LHC next week pending HD arrangements -Continuous tele monitoring, strict I/O, daily weights, fluid restriction  Acute hypoxic respiratory failure 2/2 volume overload. Remains on 2L [place]. Wean as able. Suspect this should be able to be done following volume removal with HD.  T2DM. Sugars stable. Continue SSI  Best practice:  CODE STATUS: full Diet: cardiac, renal DVT for prophylaxis: heparin Dispo: pending further medical management. Ortho follow up at discharge. Sleep study and palliative care referral at discharge. PT recommending SNF at this time   [name], MD [place] PGY-1 PAGER #: [phone] 12/08/19  5:55 AM  Labs    CBC Latest Ref Rng & Units 12/07/2019 12/06/2019 12/05/2019  WBC 4.0 - 10.5 K/uL 8.0 9.5 8.2    Hemoglobin 13.0 - 17.0 g/dL 8.0(L) 9.0(L) 8.4(L)  Hematocrit 39.0 - 52.0 % 25.9(L) 29.4(L) 27.7(L)  Platelets 150 - 400 K/uL 181 212 192   BMP Latest Ref Rng & Units 12/07/2019 12/06/2019 12/05/2019  Glucose 70 - 99 mg/dL 113(H) 136(H) 128(H)  BUN 8 - 23 mg/dL 86(H) 84(H) 81(H)  Creatinine 0.61 - 1.24 mg/dL 6.72(H) 6.85(H) 6.44(H)  Sodium 135 - 145 mmol/L 142 140 139  Potassium 3.5 - 5.1 mmol/L 4.2 4.1 3.8  Chloride 98 - 111 mmol/L 109 109 112(H)  CO2 22 - 32 mmol/L 17(L) 18(L) 19(L)  Calcium 8.9 - 10.3 mg/dL 7.9(L) 7.9(L) 7.5(L)

## 2019-12-08 NOTE — Progress Notes (Signed)
Patient ID: [name], male   DOB: [birth year], 64 y.o.   MRN: [id number] S: No new complaints this morning O:BP 140/75 (BP Location: Left Arm)   Pulse 100   Temp 98.1 F (36.7 C) (Oral)   Resp 19   Ht 5\' 3"  (1.6 m)   Wt 71.6 kg   SpO2 96%   BMI 27.96 kg/m   Intake/Output Summary (Last 24 hours) at 12/08/2019 1155 Last data filed at 12/08/2019 0600 Gross per 24 hour  Intake 240 ml  Output 750 ml  Net -510 ml   Intake/Output: I/O last 3 completed shifts: In: 240 [P.O.:240] Out: 2325 [Urine:2325]  Intake/Output this shift:  No intake/output data recorded. Weight change:  Gen: NAD CVS: no rub Resp: CTA Abd: +BS, soft, NT/ND Ext: 1+ edema of right leg, wrapping in place, s/p LBKA  Recent Labs  Lab 12/02/19 0435 12/03/19 0531 12/04/19 0237 12/05/19 0353 12/06/19 1315 12/07/19 0522 12/08/19 0546  NA 141 140 140 139 140 142 142  K 4.5 4.3 3.9 3.8 4.1 4.2 4.4  CL 112* 107 109 112* 109 109 108  CO2 20* 18* 19* 19* 18* 17* 18*  GLUCOSE 131* 162* 130* 128* 136* 113* 119*  BUN 83* 87* 85* 81* 84* 86* 91*  CREATININE 6.19* 6.28* 6.43* 6.44* 6.85* 6.72* 6.67*  ALBUMIN  --  2.2* 2.1* 2.3* 2.5* 2.3* 2.3*  CALCIUM 7.4* 7.5* 7.5* 7.5* 7.9* 7.9* 8.2*  PHOS  --  5.7* 5.8* 5.9* 6.6* 7.0* 7.0*   Liver Function Tests: Recent Labs  Lab 12/06/19 1315 12/07/19 0522 12/08/19 0546  ALBUMIN 2.5* 2.3* 2.3*   No results for input(s): LIPASE, AMYLASE in the last 168 hours. No results for input(s): AMMONIA in the last 168 hours. CBC: Recent Labs  Lab 12/04/19 0237 12/04/19 0237 12/05/19 0353 12/05/19 0353 12/06/19 1315 12/07/19 0522 12/08/19 0546  WBC 8.0   < > 8.2   < > 9.5 8.0 9.6  HGB 8.0*   < > 8.4*   < > 9.0* 8.0* 8.3*  HCT 25.3*   < > 27.7*   < > 29.4* 25.9* 26.7*  MCV 94.1  --  95.2  --  94.8 95.2 95.0  PLT 176   < > 192   < > 212 181 193   < > = values in this interval not displayed.   Cardiac Enzymes: No results for input(s): CKTOTAL, CKMB, CKMBINDEX, TROPONINI  in the last 168 hours. CBG: Recent Labs  Lab 12/07/19 0810 12/07/19 1159 12/07/19 1712 12/07/19 2104 12/08/19 0738  GLUCAP 112* 161* 109* 132* 113*    Iron Studies: No results for input(s): IRON, TIBC, TRANSFERRIN, FERRITIN in the last 72 hours. Studies/Results: NM Myocar Multi W/Spect W/Wall Motion / EF  Result Date: 12/06/2019  There was no ST segment deviation noted during stress.  Defect 1: There is a large defect of severe severity present in the basal inferolateral, mid inferolateral, apical septal, apical lateral and apex location.  Findings consistent with ischemia.  Nuclear stress EF: 31%.  This is a high risk study.  The left ventricular ejection fraction is moderately decreased (30-44%).  No T wave inversion was noted during stress.  Large size, severe intensity apical and inferolateral perfusion defect which is reversible (SDS 7), suggestive of possible LCx territory ischemia. LVEF 31% with severe global hypokinesis and inferolateral hypokinesis to akinesis. This is a high risk study.   VAS [place] UPPER EXT VEIN MAPPING (PRE-OP AVF)  Result Date: 12/06/2019 UPPER EXTREMITY VEIN  MAPPING  Indications: Pre-access. Performing Technologist: June Leap RDMS, RVT  Examination Guidelines: A complete evaluation includes B-mode imaging, spectral Doppler, color Doppler, and power Doppler as needed of all accessible portions of each vessel. Bilateral testing is considered an integral part of a complete examination. Limited examinations for reoccurring indications may be performed as noted. +-----------------+-------------+----------+---------+ Right Cephalic   Diameter (cm)Depth (cm)Findings  +-----------------+-------------+----------+---------+ Shoulder             0.46                         +-----------------+-------------+----------+---------+ Mid upper arm        0.49                         +-----------------+-------------+----------+---------+ Dist upper arm       0.53                          +-----------------+-------------+----------+---------+ Antecubital fossa    0.59                         +-----------------+-------------+----------+---------+ Prox forearm         0.41               branching +-----------------+-------------+----------+---------+ Mid forearm          0.30                         +-----------------+-------------+----------+---------+ Dist forearm         0.29                         +-----------------+-------------+----------+---------+ +-----------------+-------------+----------+---------+ Left Cephalic    Diameter (cm)Depth (cm)Findings  +-----------------+-------------+----------+---------+ Shoulder             0.52                         +-----------------+-------------+----------+---------+ Mid upper arm        0.39                         +-----------------+-------------+----------+---------+ Dist upper arm       0.40                         +-----------------+-------------+----------+---------+ Antecubital fossa    0.54                         +-----------------+-------------+----------+---------+ Prox forearm         0.36               branching +-----------------+-------------+----------+---------+ Mid forearm          0.32                         +-----------------+-------------+----------+---------+ Dist forearm         0.30                         +-----------------+-------------+----------+---------+ *See table(s) above for measurements and observations.  Diagnosing physician: [name] Mayo MD Electronically signed by [name] Mayo MD on 12/06/2019 at 7:19:20 PM.    Final    . carvedilol  25 mg Oral BID WC  .  Chlorhexidine Gluconate Cloth  6 each Topical Daily  . furosemide  40 mg Intravenous Daily  . heparin  5,000 Units Subcutaneous Q8H  . hydrALAZINE  25 mg Oral Q8H  . hydrocerin   Topical BID  . insulin aspart  0-5 Units Subcutaneous QHS  . insulin aspart  0-9  Units Subcutaneous TID WC  . isosorbide mononitrate  15 mg Oral Daily  . multivitamin  1 tablet Oral QHS  . sevelamer carbonate  800 mg Oral TID WC  . sodium bicarbonate  650 mg Oral TID  . sodium chloride flush  3 mL Intravenous Once  . tamsulosin  0.4 mg Oral QHS    BMET    Component Value Date/Time   NA 142 12/08/2019 0546   K 4.4 12/08/2019 0546   CL 108 12/08/2019 0546   CO2 18 (L) 12/08/2019 0546   GLUCOSE 119 (H) 12/08/2019 0546   BUN 91 (H) 12/08/2019 0546   CREATININE 6.67 (H) 12/08/2019 0546   CALCIUM 8.2 (L) 12/08/2019 0546   GFRNONAA 8 (L) 12/08/2019 0546   GFRAA 9 (L) 12/08/2019 0546   CBC    Component Value Date/Time   WBC 9.6 12/08/2019 0546   RBC 2.81 (L) 12/08/2019 0546   HGB 8.3 (L) 12/08/2019 0546   HCT 26.7 (L) 12/08/2019 0546   HCT 25.5 (L) 11/29/2019 1103   PLT 193 12/08/2019 0546   MCV 95.0 12/08/2019 0546   MCH 29.5 12/08/2019 0546   MCHC 31.1 12/08/2019 0546   RDW 17.6 (H) 12/08/2019 0546   LYMPHSABS 0.7 11/28/2019 1330   MONOABS 0.5 11/28/2019 1330   EOSABS 0.5 11/28/2019 1330   BASOSABS 0.0 11/28/2019 1330   Assessment/Plan:  1. AKIvs progressive CKD stage IV-V due to poorly controlled DM and HTN (has not seen a physician for 5 years). Renal US revealed chronic medical renal disease and increased echogenicity. His BUN/Cr have continued to climb with diuresis and I have discussed RRT with [name] who is amenable to proceed with vascular access placement.  1. He will likely need to start Hd this admission and [facility] has been consulted for AVF/TDC placement on 12/09/19.  2. Will initiate on HD after [facility] placed in preparation for his left heart cath 2. Acute combined systolic and diastolic CHF- diuresing with improved volume status. Continue with IV lasix. Cardiology following.  3. Cardiomyopathy with EF of 30-35%, and grade II diastolic dysfunction. Cardiology consulted 1. lexiscan myoview revealed reversible ischemia in LCx  territory 2. Discussed case with Dr. Harl Bowie who will schedule left heart cath after he gets his vascular access and starts dialysis. 4. Urgent HTN- with evidence of end organ damage (hypertensive heart disease and progressive CKD). BP has improved with meds and diuresis 5. Urinary retention - s/p foley on 12/04/19 due to failed voiding trials. 6. Anemia of CKD staage IV- will start ESA and follow h/h and iron stores 7. Secondary HPTH- Phos elevated and iPTH 189. Will start binders and follow. 8. Cellulitis of RLE- s/p Vanc/ceftriaxone/flagyl x 3 days now on doxycycline for 10 days. 9. PAD- s/p Left BKA  10. IDDM- poorly controlled due to noncompliance. Per primary.   Donetta Potts, MD [employer] [phone]

## 2019-12-08 NOTE — Progress Notes (Signed)
The patient refused a dressing change at this time.  He has also refused incentive spirometry offers.  The patient was educated on the importance of dressing changes and the use of incentive spirometry.  Will continue to monitor and encourage the patient.

## 2019-12-09 ENCOUNTER — Encounter (HOSPITAL_COMMUNITY): Payer: Self-pay | Admitting: Internal Medicine

## 2019-12-09 LAB — GLUCOSE, CAPILLARY
Glucose-Capillary: 120 mg/dL — ABNORMAL HIGH (ref 70–99)
Glucose-Capillary: 134 mg/dL — ABNORMAL HIGH (ref 70–99)
Glucose-Capillary: 132 mg/dL — ABNORMAL HIGH (ref 70–99)
Glucose-Capillary: 110 mg/dL — ABNORMAL HIGH (ref 70–99)

## 2019-12-09 MED ORDER — DARBEPOETIN ALFA 100 MCG/0.5ML IJ SOSY
100.0000 ug | PREFILLED_SYRINGE | INTRAMUSCULAR | Status: DC
  Administered 2019-12-09 – 2019-12-16 (×2): 100 ug via SUBCUTANEOUS
  Filled 2019-12-09 (×2): qty 0.5

## 2019-12-09 MED ORDER — SODIUM CHLORIDE 0.9 % IV SOLN
125.0000 mg | Freq: Every day | INTRAVENOUS | Status: AC
  Administered 2019-12-09 – 2019-12-12 (×4): 125 mg via INTRAVENOUS
  Filled 2019-12-09 (×4): qty 10

## 2019-12-09 NOTE — H&P (View-Only) (Signed)
Progress Note    12/09/2019 7:29 AM * No surgery found *  Subjective:  Awake and alert. Denies DOE, SOB, CP.  Follow-up for permanent HD access.  Cardiology/nephrology notes reviewed.    Vitals:   12/09/19 0000 12/09/19 0516  BP: 125/69 (!) 145/78  Pulse: 84 81  Resp: (!) 25 (!) 23  Temp:  97.9 F (36.6 C)  SpO2: 96% 93%    Physical Exam: Cardiac:  RRR Lungs:  CTA bil Extremities:  IV in left forearm. 2+ palp radial pulse A and O x 4 Right Cephalic  Diameter (cm)Depth (cm)Findings   +-----------------+-------------+----------+---------+  Shoulder       0.46               +-----------------+-------------+----------+---------+  Mid upper arm    0.49               +-----------------+-------------+----------+---------+  Dist upper arm    0.53               +-----------------+-------------+----------+---------+  Antecubital fossa  0.59               +-----------------+-------------+----------+---------+  Prox forearm     0.41        branching  +-----------------+-------------+----------+---------+  Mid forearm     0.30               +-----------------+-------------+----------+---------+  Dist forearm     0.29               +-----------------+-------------+----------+---------+   +-----------------+-------------+----------+---------+  Left Cephalic  Diameter (cm)Depth (cm)Findings   +-----------------+-------------+----------+---------+  Shoulder       0.52               +-----------------+-------------+----------+---------+  Mid upper arm    0.39               +-----------------+-------------+----------+---------+  Dist upper arm    0.40               +-----------------+-------------+----------+---------+  Antecubital fossa  0.54                +-----------------+-------------+----------+---------+  Prox forearm     0.36        branching  +-----------------+-------------+----------+---------+  Mid forearm     0.32               +-----------------+-------------+----------+---------+  Dist forearm     0.30         CBC    Component Value Date/Time   WBC 9.6 12/08/2019 0546   RBC 2.81 (L) 12/08/2019 0546   HGB 8.3 (L) 12/08/2019 0546   HCT 26.7 (L) 12/08/2019 0546   HCT 25.5 (L) 11/29/2019 1103   PLT 193 12/08/2019 0546   MCV 95.0 12/08/2019 0546   MCH 29.5 12/08/2019 0546   MCHC 31.1 12/08/2019 0546   RDW 17.6 (H) 12/08/2019 0546   LYMPHSABS 0.7 11/28/2019 1330   MONOABS 0.5 11/28/2019 1330   EOSABS 0.5 11/28/2019 1330   BASOSABS 0.0 11/28/2019 1330    BMET    Component Value Date/Time   NA 142 12/08/2019 0546   K 4.4 12/08/2019 0546   CL 108 12/08/2019 0546   CO2 18 (L) 12/08/2019 0546   GLUCOSE 119 (H) 12/08/2019 0546   BUN 91 (H) 12/08/2019 0546   CREATININE 6.67 (H) 12/08/2019 0546   CALCIUM 8.2 (L) 12/08/2019 0546   GFRNONAA 8 (L) 12/08/2019 0546   GFRAA 9 (L) 12/08/2019 0546     Intake/Output Summary (Last  24 hours) at 12/09/2019 0729 Last data filed at 12/08/2019 1900 Gross per 24 hour  Intake -  Output 500 ml  Net -500 ml    HOSPITAL MEDICATIONS Scheduled Meds: . carvedilol  25 mg Oral BID WC  . Chlorhexidine Gluconate Cloth  6 each Topical Daily  . furosemide  40 mg Intravenous Daily  . heparin  5,000 Units Subcutaneous Q8H  . hydrALAZINE  25 mg Oral Q8H  . hydrocerin   Topical BID  . insulin aspart  0-5 Units Subcutaneous QHS  . insulin aspart  0-9 Units Subcutaneous TID WC  . isosorbide mononitrate  15 mg Oral Daily  . multivitamin  1 tablet Oral QHS  . sevelamer carbonate  800 mg Oral TID WC  . sodium bicarbonate  650 mg Oral TID  . sodium chloride flush  3 mL Intravenous Once  . tamsulosin  0.4 mg Oral QHS   Continuous Infusions: PRN  Meds:.  Assessment:  64 y.o. male is admitted with AKI vs progressive stage IV-V kidney disease needs RRT>>HD. He is right hand dominant.  CHF/cardiomyopathy s/p Lexiscan. Plan is to proceed with heart cath after HD access placed   Plan: -Appears left cephalic vein amenable to AVF.   -Confirm surgery date with Dr. Donzetta Matters. I will place pre-op orders for tomorrow.  -DVT prophylaxis:  Heparin [place]  Unable to accommodate his procedure on tomorrow's surgery schedule.  I will place him on the schedule for Wednesday. NPO after MN tomorrow night.   Risa Grill, PA-C [facility] [phone] 12/09/2019  7:29 AM

## 2019-12-09 NOTE — Progress Notes (Signed)
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 Also found to have bilateral pleural effusions Admitted 11/28/19 for treatment of cellulitis via IV antibiotics and determination of AKIvsCKD. WBAT on R LE    PT Comments    Pt agreeable to mobility this day with PT. PT and pt tried x2 to don pt LLE prosthesis, but unsuccessful as pt citing improper fit and needing a fitting. Pt transferred to and from [facility] with min assist for steadying and guiding pt path to destination surface, pt lacking safety awareness and declining to use RW even with encouragement from PT x2. Pt participated well in repeated transfer training at [facility] to work on RLE strengthening for transfers in the future. Pt still with limited mobility while acute, pt may need ST-SNF post-acutely if pt agreeable. PT to continue to follow acutely and will progress mobility as able.    Follow Up Recommendations  Home health PT(vs SNF given safety issues as well as limited mobility while acute)     Equipment Recommendations  None recommended by PT;Other (comment)    Recommendations for Other Services       Precautions / Restrictions Precautions Precautions: Fall;Other (comment) Precaution Comments: Left BKA - LLE prosthesis (not fitting for past two sessions per pt, states he needs to be re-fitted) Required Braces or Orthoses: Other Brace Other Brace: R post-op shoe Restrictions Weight Bearing Restrictions: No Other Position/Activity Restrictions: WBAT RLE    Mobility  Bed Mobility Overal bed mobility: Needs Assistance Bed Mobility: Supine to Sit     Supine to sit: Supervision;HOB elevated     General bed mobility comments: for safety,  increased time with use of bedrails to pull to sit.  Transfers Overall transfer level: Needs assistance Equipment used: 1 person hand held assist Transfers: Stand Pivot Transfers;Sit to/from Stand Sit to Stand: Min guard Stand pivot transfers: Min assist       General transfer comment: Min guard for slow and steady rise to stand, pt immediately reaching for environment to steady although refused RW. Min assist for stand pivot to and from [facility] for steadying, guiding pt to/from [facility], and hand placement on destination surface to faciltate safe sitting.  Ambulation/Gait             General Gait Details: unable this day, prosthesis not fitting even with 2 don attempts with PT assist   Stairs             Wheelchair Mobility    Modified Rankin (Stroke Patients Only)       Balance Overall balance assessment: Needs assistance Sitting-balance support: Feet supported Sitting balance-Leahy Scale: Good     Standing balance support: During functional activity;Single extremity supported Standing balance-Leahy Scale: Poor Standing balance comment: reaches for environment during static and dynamic standing, requires PT assist during transfers                            Cognition Arousal/Alertness: Awake/alert Behavior During Therapy: Impulsive Overall Cognitive Status: Impaired/Different from baseline Area of Impairment: Safety/judgement                         Safety/Judgement: Decreased awareness of  safety;Decreased awareness of deficits     General Comments: pt declined use of RW for transfer without prosthesis, but very unsteady requiring PT assist for mobility.      Exercises Other Exercises Other Exercises: Sit to stand x5 from [facility] and bilateral UE support, emphasis on safe form with transfers and goal of RLE strengthening    General Comments General comments (skin integrity, edema, etc.): VSS, except SpO2 on RA at 85% requiring replacement  of 1.5LO2 via [place]      Pertinent Vitals/Pain Pain Assessment: No/denies pain Pain Intervention(s): Limited activity within patient's tolerance;Monitored during session    Home Living Family/patient expects to be discharged to:: Private residence Living Arrangements: Other relatives                  Prior Function            PT Goals (current goals can now be found in the care plan section) Acute Rehab PT Goals Patient Stated Goal: to get back to being independent PT Goal Formulation: With patient Time For Goal Achievement: 12/14/19 Potential to Achieve Goals: Fair Progress towards PT goals: Not progressing toward goals - comment(transfer level only at this time)    Frequency    Min 3X/week      PT Plan Current plan remains appropriate    Co-evaluation              AM-PAC PT "6 Clicks" Mobility   Outcome Measure  Help needed turning from your back to your side while in a flat bed without using bedrails?: A Little Help needed moving from lying on your back to sitting on the side of a flat bed without using bedrails?: A Little Help needed moving to and from a bed to a chair (including a wheelchair)?: A Little Help needed standing up from a chair using your arms (e.g., wheelchair or bedside chair)?: A Little Help needed to walk in hospital room?: A Lot Help needed climbing 3-5 steps with a railing? : A Lot 6 Click Score: 16    End of Session Equipment Utilized During Treatment: Oxygen;Other (comment)(post-op shoe) Activity Tolerance: Treatment limited secondary to agitation Patient left: in bed;with call bell/phone within reach(RN declining need for bed alarm, pt refused previously) Nurse Communication: Mobility status PT Visit Diagnosis: Unsteadiness on feet (R26.81);Other abnormalities of gait and mobility (R26.89);Muscle weakness (generalized) (M62.81);Difficulty in walking, not elsewhere classified (R26.2)     Time: 7494-4967 PT Time Calculation  (min) (ACUTE ONLY): 24 min  Charges:  $Therapeutic Activity: 23-37 mins                     Bryker Fletchall E, PT Acute Rehabilitation Services Pager [phone]  Office [phone]  Dynisha Due D Naethan Bracewell 12/09/2019, 9:23 AM

## 2019-12-09 NOTE — Progress Notes (Addendum)
Una boot removed and Leg cleanse with with soap, rinse and dried for new una boot to be applied. Ortho-tech, notified.

## 2019-12-09 NOTE — TOC Progression Note (Signed)
Transition of Care [facility]) - Progression Note    Patient Details  Name: [name] MRN: [id number] Date of Birth: 1955/10/05  Transition of Care [facility]) CM/SW [place], LCSW Phone Number: 12/09/2019, 3:05 PM  Clinical Narrative:    CSW continuing to follow for SNF placement once dialysis plans are solidified. Will have to find SNF that will transport to dialysis.         Expected Discharge Plan and Services                                                 Social Determinants of Health (SDOH) Interventions    Readmission Risk Interventions No flowsheet data found.

## 2019-12-09 NOTE — Progress Notes (Signed)
Orthopedic Tech Progress Note Patient Details:  [name] Oct 04, 1955 [id number] Did a fresh UNNA BOOT to patient Ortho Devices Type of Ortho Device: Haematologist Ortho Device/Splint Location: RLE Ortho Device/Splint Interventions: Ordered, Application   Post Interventions Patient Tolerated: Well Instructions Provided: Care of [place] 12/09/2019, 6:36 PM

## 2019-12-09 NOTE — Progress Notes (Signed)
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  Subjective/Interm history  No overnight events  Significant Hospital Events   4/29 hospital admission 4/30 ortho and neph consulted. Sodium bicarb started 5/1 40mg  lasix>1.6L out. Lopressor started. 5/2 transfused 1U PRBC for hgb of 7. Vanc/rocephin/flagyl stopped>transitioned to doxy. flomax started for urinary retention.  5/3 hgb 8.5. renal function continuing to decline 5/4 cr continuing to rise, cardiology consulted--hydral/imdur started, metop>coreg 5/5 no significant changes. Foley removed however was retaining so foley replaced.  5/6 creatinine plateauing. vasc consulted for permanent access. 5/7 continued renal decline. Creatinine now 6.85. lexiscan, vein mapping. Planning to obtain access for HD on Monday 5/8 no significant events 5/9 no significant changes 5/10 HD access placement pushed back to tomorrow. Will do LHC at same time.  Objective   Blood pressure (!) 145/78, pulse 81, temperature 97.9 F (36.6 C), temperature source Oral, resp. rate (!) 23, height 5\' 3"  (1.6 m), weight 71.6 kg, SpO2 93 %.     Intake/Output Summary (Last 24 hours) at 12/09/2019 0542 Last data filed at 12/08/2019 1900 Gross per 24 hour  Intake 240 ml  Output 1250 ml  Net -1010 ml   Filed Weights   12/04/19 0608 12/05/19 0412 12/08/19 0554  Weight: 73.1 kg 70 kg 71.6 kg    Examination: GENERAL: in no acute distress CARDIAC: heart RRR.  Significant improvement in RLE edema. PULMONARY: lung sounds diminished. No crackles or rhonchi SKIN: UNA boot to RLE GU: foley  Consults:  Orthopedics-signed off Nephology  Cardiology Vascular surgery Significant Diagnostic Tests:  4/29 R foot  MRI>>no evidence of loculated fluid collection or osteomyelitis  4/29 CXR>>b/l effusions with basilar atelectasis vs pna; vascular congestion with diffuse interstitial opacities suspicious for pulm edema  4/30 CT renal study>>no acute abnormalities in abd/pelvis.  4/30 echocardiogram>>Left ventricular ejection fraction, by estimation, is 30 to 35%. The left ventricle has moderately decreased function. The left ventricle demonstrates global hypokinesis. Diffuse hypokinesis worse in the inferior base .  The left ventricular internal cavity size was mildly dilated. There is moderate left ventricular hypertrophy. Left ventricular diastolic parameters are consistent with Grade II diastolic dysfunction (pseudonormalization). Elevated left ventricular end-diastolic pressure. Right ventricular systolic function is moderately reduced. The right ventricular size is mildly enlarged. The mitral valve is normal in structure. Mild mitral valve egurgitation. No evidence of mitral stenosis. The aortic valve is tricuspid. Aortic valve regurgitation is not visualized. Mild aortic valve sclerosis is present, with no evidence of aortic valve stenosis. The inferior vena cava is dilated in size with <50% respiratory variability, suggesting right atrial pressure of 15 mmHg.   5/5 cardiac amyloid scan>not suggestive of amyloid 5/7 lexiscan>large severe apical and inferolateral perfusion defect which is reversible suggestive of possible LCx territory ischemia. LVEF 31% with severe global hypokinesia and inferolateral hypokinesis to akinesis.  Micro Data:  4/29 blood cultures>>NG 5/2 urine cultures>>NG  Antimicrobials:  Vancomycin 4/29>>5/2 Rocephin 4/29>>5/2 Flagyl 4/29>>5/2 Doxycycline 5/2>>5/8  Summary  64 yo male with T2DM and HTN who was admitted to IMTS on 4/29 for purulent cellulitis of RLE and found to have heart and renal failure. Remaining hospitalized while arranging preparations for HD including obtaining  access at which time he  will be able to also undergo a heart cath for ischemic workup.  Resolved/Stable hospital problems  Bacturia-received 3d course of vanc/rocephin/flagyl. No growth on urine culture RLE cellulitis. Completed 10d course of abx treatment   Borderline gamma gap--4. SPEP/UPEP normal Normocytic anemia Balance issues/deconditioning. Likely 2/2 right foot cellulitis. PT recommending SNF at this time. 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 Likely due to poorly controlled DM and HTN along with CHF Urinary retention. Failed voiding trial on 5/5 so foley had to be replaced. Plan: nephrology managing. AVF/AVG/TDC placement pushed back to tomorrow. Continue bicarb and renvela. Strict I/Os. Continue foley catheter and flomax for urinary retention. Can consider another voiding trial once done with procedures.  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 EF 30-35% with global LV hypokinesis and G2DD. lexiscan showing severe inferolateral and apical defects. Cardiology planning for heart cath at the same time as AVF/AVG/TDC placement tomorrow. Hypertension. Blood pressures stable PAD. As indicated on 5/4 ABI Plan: cardiology managing. On hydral, imdur, coreg and lasix. Continue tele monitoring, strict I/Os, daily weights.  Acute hypoxic respiratory failure 2/2 volume overload. Remains on 2L [place]. Wean as able. Suspect this should be able to be done following volume removal with HD.  T2DM. Blood sugars stable on SSI. Continue current regimen.  Best practice:  CODE STATUS: full Diet: cardiac, renal DVT for prophylaxis: heparin Dispo: pending further medical management. Ortho follow up at  discharge. Sleep study and palliative care referral at discharge. PT recommending SNF at this time   [name], MD [place] PGY-1 PAGER #: [phone] 12/09/19  5:42 AM  Labs    CBC Latest Ref Rng & Units 12/08/2019 12/07/2019 12/06/2019  WBC 4.0 - 10.5 K/uL 9.6 8.0 9.5  Hemoglobin 13.0 - 17.0 g/dL 8.3(L) 8.0(L) 9.0(L)  Hematocrit 39.0 - 52.0 % 26.7(L) 25.9(L) 29.4(L)  Platelets 150 - 400 K/uL 193 181 212   BMP Latest Ref Rng & Units 12/08/2019 12/07/2019 12/06/2019  Glucose 70 - 99 mg/dL 119(H) 113(H) 136(H)  BUN 8 - 23 mg/dL 91(H) 86(H) 84(H)  Creatinine 0.61 - 1.24 mg/dL 6.67(H) 6.72(H) 6.85(H)  Sodium 135 - 145 mmol/L 142 142 140  Potassium 3.5 - 5.1 mmol/L 4.4 4.2 4.1  Chloride 98 - 111 mmol/L 108 109 109  CO2 22 - 32 mmol/L 18(L) 17(L) 18(L)  Calcium 8.9 - 10.3 mg/dL 8.2(L) 7.9(L) 7.9(L)

## 2019-12-09 NOTE — Progress Notes (Addendum)
Progress Note    12/09/2019 7:29 AM * No surgery found *  Subjective:  Awake and alert. Denies DOE, SOB, CP.  Follow-up for permanent HD access.  Cardiology/nephrology notes reviewed.    Vitals:   12/09/19 0000 12/09/19 0516  BP: 125/69 (!) 145/78  Pulse: 84 81  Resp: (!) 25 (!) 23  Temp:  97.9 F (36.6 C)  SpO2: 96% 93%    Physical Exam: Cardiac:  RRR Lungs:  CTA bil Extremities:  IV in left forearm. 2+ palp radial pulse A and O x 4 Right Cephalic  Diameter (cm)Depth (cm)Findings   +-----------------+-------------+----------+---------+  Shoulder       0.46               +-----------------+-------------+----------+---------+  Mid upper arm    0.49               +-----------------+-------------+----------+---------+  Dist upper arm    0.53               +-----------------+-------------+----------+---------+  Antecubital fossa  0.59               +-----------------+-------------+----------+---------+  Prox forearm     0.41        branching  +-----------------+-------------+----------+---------+  Mid forearm     0.30               +-----------------+-------------+----------+---------+  Dist forearm     0.29               +-----------------+-------------+----------+---------+   +-----------------+-------------+----------+---------+  Left Cephalic  Diameter (cm)Depth (cm)Findings   +-----------------+-------------+----------+---------+  Shoulder       0.52               +-----------------+-------------+----------+---------+  Mid upper arm    0.39               +-----------------+-------------+----------+---------+  Dist upper arm    0.40               +-----------------+-------------+----------+---------+  Antecubital fossa  0.54                 +-----------------+-------------+----------+---------+  Prox forearm     0.36        branching  +-----------------+-------------+----------+---------+  Mid forearm     0.32               +-----------------+-------------+----------+---------+  Dist forearm     0.30         CBC    Component Value Date/Time   WBC 9.6 12/08/2019 0546   RBC 2.81 (L) 12/08/2019 0546   HGB 8.3 (L) 12/08/2019 0546   HCT 26.7 (L) 12/08/2019 0546   HCT 25.5 (L) 11/29/2019 1103   PLT 193 12/08/2019 0546   MCV 95.0 12/08/2019 0546   MCH 29.5 12/08/2019 0546   MCHC 31.1 12/08/2019 0546   RDW 17.6 (H) 12/08/2019 0546   LYMPHSABS 0.7 11/28/2019 1330   MONOABS 0.5 11/28/2019 1330   EOSABS 0.5 11/28/2019 1330   BASOSABS 0.0 11/28/2019 1330    BMET    Component Value Date/Time   NA 142 12/08/2019 0546   K 4.4 12/08/2019 0546   CL 108 12/08/2019 0546   CO2 18 (L) 12/08/2019 0546   GLUCOSE 119 (H) 12/08/2019 0546   BUN 91 (H) 12/08/2019 0546   CREATININE 6.67 (H) 12/08/2019 0546   CALCIUM 8.2 (L) 12/08/2019 0546   GFRNONAA 8 (L) 12/08/2019 0546   GFRAA 9 (L) 12/08/2019 0546     Intake/Output Summary (  Last 24 hours) at 12/09/2019 0729 Last data filed at 12/08/2019 1900 Gross per 24 hour  Intake --  Output 500 ml  Net -500 ml    HOSPITAL MEDICATIONS Scheduled Meds: . carvedilol  25 mg Oral BID WC  . Chlorhexidine Gluconate Cloth  6 each Topical Daily  . furosemide  40 mg Intravenous Daily  . heparin  5,000 Units Subcutaneous Q8H  . hydrALAZINE  25 mg Oral Q8H  . hydrocerin   Topical BID  . insulin aspart  0-5 Units Subcutaneous QHS  . insulin aspart  0-9 Units Subcutaneous TID WC  . isosorbide mononitrate  15 mg Oral Daily  . multivitamin  1 tablet Oral QHS  . sevelamer carbonate  800 mg Oral TID WC  . sodium bicarbonate  650 mg Oral TID  . sodium chloride flush  3 mL Intravenous Once  . tamsulosin  0.4 mg Oral QHS   Continuous  Infusions: PRN Meds:.  Assessment:  64 y.o. male is admitted with AKI vs progressive stage IV-V kidney disease needs RRT>>HD. He is right hand dominant.  CHF/cardiomyopathy s/p Lexiscan. Plan is to proceed with heart cath after HD access placed   Plan: -Appears left cephalic vein amenable to AVF.   -Confirm surgery date with Dr. Donzetta Matters. I will place pre-op orders for tomorrow.  -DVT prophylaxis:  Heparin [place]  Unable to accommodate his procedure on tomorrow's surgery schedule.  I will place him on the schedule for Wednesday. NPO after MN tomorrow night.   Risa Grill, PA-C [facility] [phone] 12/09/2019  7:29 AM

## 2019-12-09 NOTE — Progress Notes (Addendum)
[facility] Daily Progress Note  S: Patient seen sitting upright on the edge of his bed this morning eating breakfast, no apparent distress, comfortable work of breathing.  Denies having any questions this morning.  States he is supposed to have cardiac cath and catheter placed, thinks it is to be happening tomorrow.  O:BP (!) 142/77 (BP Location: Left Arm)   Pulse 90   Temp 98.4 F (36.9 C) (Oral)   Resp (!) 26   Ht 5\' 3"  (1.6 m)   Wt 71.6 kg   SpO2 95%   BMI 27.96 kg/m   Intake/Output Summary (Last 24 hours) at 12/09/2019 0915 Last data filed at 12/08/2019 1900 Gross per 24 hour  Intake --  Output 500 ml  Net -500 ml   Intake/Output: I/O last 3 completed shifts: In: 240 [P.O.:240] Out: 1250 [Urine:1250]  Intake/Output this shift:  No intake/output data recorded. 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  Recent Labs  Lab 12/03/19 0531 12/04/19 0237 12/05/19 0353 12/06/19 1315 12/07/19 0522 12/08/19 0546  NA 140 140 139 140 142 142  K 4.3 3.9 3.8 4.1 4.2 4.4  CL 107 109 112* 109 109 108  CO2 18* 19* 19* 18* 17* 18*  GLUCOSE 162* 130* 128* 136* 113* 119*  BUN 87* 85* 81* 84* 86* 91*  CREATININE 6.28* 6.43* 6.44* 6.85* 6.72* 6.67*  ALBUMIN 2.2* 2.1* 2.3* 2.5* 2.3* 2.3*  CALCIUM 7.5* 7.5* 7.5* 7.9* 7.9* 8.2*  PHOS 5.7* 5.8* 5.9* 6.6* 7.0* 7.0*   Liver Function Tests: Recent Labs  Lab 12/06/19 1315 12/07/19 0522 12/08/19 0546  ALBUMIN 2.5* 2.3* 2.3*   No results for input(s): LIPASE, AMYLASE in the last 168 hours. No results for input(s):  AMMONIA in the last 168 hours. CBC: Recent Labs  Lab 12/04/19 0237 12/04/19 0237 12/05/19 0353 12/05/19 0353 12/06/19 1315 12/07/19 0522 12/08/19 0546  WBC 8.0   < > 8.2   < > 9.5 8.0 9.6  HGB 8.0*   < > 8.4*   < > 9.0* 8.0* 8.3*  HCT 25.3*   < > 27.7*   < > 29.4* 25.9* 26.7*  MCV 94.1  --  95.2  --  94.8 95.2 95.0  PLT 176   < > 192   < > 212 181 193   < > = values in this interval not displayed.   Cardiac Enzymes: No results for input(s): CKTOTAL, CKMB, CKMBINDEX, TROPONINI in the last 168 hours. CBG: Recent Labs  Lab 12/08/19 0738 12/08/19 1157 12/08/19 1623 12/08/19 2123 12/09/19 0751  GLUCAP 113* 167* 120* 139* 120*    Iron Studies: No results for input(s): IRON, TIBC, TRANSFERRIN, FERRITIN in the last 72 hours. Studies/Results: No results found. . carvedilol  25 mg Oral BID WC  . Chlorhexidine Gluconate Cloth  6 each Topical Daily  . furosemide  40 mg Intravenous Daily  . heparin  5,000 Units Subcutaneous Q8H  . hydrALAZINE  25 mg Oral Q8H  . hydrocerin   Topical BID  . insulin aspart  0-5 Units Subcutaneous QHS  . insulin aspart  0-9 Units Subcutaneous TID WC  . isosorbide  mononitrate  15 mg Oral Daily  . multivitamin  1 tablet Oral QHS  . sevelamer carbonate  800 mg Oral TID WC  . sodium bicarbonate  650 mg Oral TID  . sodium chloride flush  3 mL Intravenous Once  . tamsulosin  0.4 mg Oral QHS    BMET    Component Value Date/Time   NA 142 12/08/2019 0546   K 4.4 12/08/2019 0546   CL 108 12/08/2019 0546   CO2 18 (L) 12/08/2019 0546   GLUCOSE 119 (H) 12/08/2019 0546   BUN 91 (H) 12/08/2019 0546   CREATININE 6.67 (H) 12/08/2019 0546   CALCIUM 8.2 (L) 12/08/2019 0546   GFRNONAA 8 (L) 12/08/2019 0546   GFRAA 9 (L) 12/08/2019 0546   CBC    Component Value Date/Time   WBC 9.6 12/08/2019 0546   RBC 2.81 (L) 12/08/2019 0546   HGB 8.3 (L) 12/08/2019 0546   HCT 26.7 (L) 12/08/2019 0546   HCT 25.5 (L) 11/29/2019 1103   PLT 193 12/08/2019 0546   MCV  95.0 12/08/2019 0546   MCH 29.5 12/08/2019 0546   MCHC 31.1 12/08/2019 0546   RDW 17.6 (H) 12/08/2019 0546   LYMPHSABS 0.7 11/28/2019 1330   MONOABS 0.5 11/28/2019 1330   EOSABS 0.5 11/28/2019 1330   BASOSABS 0.0 11/28/2019 1330    Assessment/Plan: 1. AKIvs progressive CKD stage IV-V: due to poorly controlled DM and HTN (has not seen a physician for 5 years). Renal US revealed chronic medical renal disease and increased echogenicity. His BUN/Cr have continued to climb with diuresis and I have discussed RRT with Mr. Castrillon who is amenable to proceed with vascular access placement. 2.3L output yesterday 5/9. 1. He will need to start HD this admission, is now at ESRD- have informed renal navigator of his status 2. VVS consulted for AVF/TDC placement, to be performed 12/11/2019 3. Will initiate on HD after [facility] placed in preparation for his left heart cath 2. Acute combined systolic and diastolic CHF: diuresing with improved volume status. Continue with IV lasix 40mg  daily. Cardiology following. Weight 73>70>71.6kg 5/9 3. Cardiomyopathy with EF of 30-35%  G2DD: Cardiology consulted 1. Lexiscan myoviewrevealed reversible ischemia in LCx territory 2. Discussed case with Dr. Harl Bowie who will schedule left heart cath after he gets his vascular access and starts dialysis. 4. Urgent HTN: with evidence of end organ damage (hypertensive heart disease and progressive CKD). BP has improved with meds and diuresis- may be able to wean meds once HD gets started  5. Urinary retention: s/p foley on 12/04/19 due to failed voiding trials. 6. Anemia of CKD IV-V: will start ESA and follow h/h and iron stores--iron stores low, will give Ferrlecit and also start ESA 7. Secondary PTH: Phos elevated and iPTH 189. Will start binders and follow- renvela 8. Cellulitis of RLE: s/p Vanc/CTX/flagyl x 3 days now on doxycycline for 10 days. 9. PAD: s/p Left BKA  10. IDDM: poorly controlled due to noncompliance. Per  primary.   Milus Banister, [place], PGY-2 12/09/2019 9:18 AM   Patient seen and examined, agree with above note with above modifications. Chronically ill appearing WM who ignored his health for years.  He is volume overloaded and renal function is not improving.  He appears to be uremic.   Would be best to start HD for his ESRD to attempt to fend off other medical issues.  Access will not be placed until Wed-  Will plan on first HD late that day.  Have informed renal navigator to get him an OP HD spot.  Add iron , esa-  Already on phos binder  Corliss Parish, MD 12/09/2019

## 2019-12-09 NOTE — Progress Notes (Signed)
Has not yet received dialysis line, per chart review planned for Wednesday. After HD line placed and dialysis initiated, will coordinate coronary angiography.   Please call cardiology service with questions or concerns.  Elouise Munroe, MD

## 2019-12-09 NOTE — Progress Notes (Signed)
Patient refused RN to look at his bottom/ sacral area stating he was fine and its nothing else we can do for that area.

## 2019-12-10 LAB — RENAL FUNCTION PANEL
Albumin: 2.3 g/dL — ABNORMAL LOW (ref 3.5–5.0)
Anion gap: 11 (ref 5–15)
BUN: 103 mg/dL — ABNORMAL HIGH (ref 8–23)
CO2: 22 mmol/L (ref 22–32)
Calcium: 8 mg/dL — ABNORMAL LOW (ref 8.9–10.3)
Chloride: 112 mmol/L — ABNORMAL HIGH (ref 98–111)
Creatinine, Ser: 7.21 mg/dL — ABNORMAL HIGH (ref 0.61–1.24)
GFR calc Af Amer: 9 mL/min — ABNORMAL LOW (ref >60)
GFR calc non Af Amer: 7 mL/min — ABNORMAL LOW (ref >60)
Glucose, Bld: 126 mg/dL — ABNORMAL HIGH (ref 70–99)
Phosphorus: 8.1 mg/dL — ABNORMAL HIGH (ref 2.5–4.6)
Potassium: 4.4 mmol/L (ref 3.5–5.1)
Sodium: 145 mmol/L (ref 135–145)

## 2019-12-10 LAB — SURGICAL PCR SCREEN
MRSA, PCR: NEGATIVE
Staphylococcus aureus: NEGATIVE

## 2019-12-10 LAB — CBC
HCT: 25.6 % — ABNORMAL LOW (ref 39.0–52.0)
Hemoglobin: 7.9 g/dL — ABNORMAL LOW (ref 13.0–17.0)
MCH: 29.6 pg (ref 26.0–34.0)
MCHC: 30.9 g/dL (ref 30.0–36.0)
MCV: 95.9 fL (ref 80.0–100.0)
Platelets: 171 10*3/uL (ref 150–400)
RBC: 2.67 MIL/uL — ABNORMAL LOW (ref 4.22–5.81)
RDW: 17.5 % — ABNORMAL HIGH (ref 11.5–15.5)
WBC: 8.2 10*3/uL (ref 4.0–10.5)
nRBC: 0 % (ref 0.0–0.2)

## 2019-12-10 LAB — GLUCOSE, CAPILLARY
Glucose-Capillary: 114 mg/dL — ABNORMAL HIGH (ref 70–99)
Glucose-Capillary: 120 mg/dL — ABNORMAL HIGH (ref 70–99)
Glucose-Capillary: 142 mg/dL — ABNORMAL HIGH (ref 70–99)
Glucose-Capillary: 122 mg/dL — ABNORMAL HIGH (ref 70–99)

## 2019-12-10 MED ORDER — CHLORHEXIDINE GLUCONATE CLOTH 2 % EX PADS
6.0000 | MEDICATED_PAD | Freq: Every day | CUTANEOUS | Status: DC
  Administered 2019-12-11 – 2019-12-17 (×7): 6 via TOPICAL

## 2019-12-10 NOTE — Progress Notes (Signed)
OP HD referral initiated to request treatment closest to patient's home [facility]). Referral will be completed after patient has had his first HD treatment in the hospital at which time HD orders and HepB lab results will be available to submit to [facility] Admissions coordinator.  Navigator will follow up with patient once OP HD seat has been confirmed.  Alphonzo Cruise, [place] Renal Navigator [phone]

## 2019-12-10 NOTE — Progress Notes (Addendum)
[facility] Daily Progress Note  S: Patient seen resting comfortably in bed, is awaiting his procedure tomorrow morning, no concerns at this time. Just taking it all in  O:BP (!) 148/79   Pulse 83   Temp 98.4 F (36.9 C)   Resp (!) 22   Ht 5\' 3"  (1.6 m)   Wt 71.5 kg   SpO2 92%   BMI 27.92 kg/m   Intake/Output Summary (Last 24 hours) at 12/10/2019 0910 Last data filed at 12/10/2019 0653 Gross per 24 hour  Intake 710 ml  Output 1775 ml  Net -1065 ml   Intake/Output: I/O last 3 completed shifts: In: 710 [P.O.:600; IV Piggyback:110] Out: 6073 [Urine:1775]  Intake/Output this shift:  No intake/output data recorded. 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-  Positive for edema Neuro: No asterixis  Recent Labs  Lab 12/04/19 0237 12/05/19 0353 12/06/19 1315 12/07/19 0522 12/08/19 0546 12/10/19 0352  NA 140 139 140 142 142 145  K 3.9 3.8 4.1 4.2 4.4 4.4  CL 109 112* 109 109 108 112*  CO2 19* 19* 18* 17* 18* 22  GLUCOSE 130* 128* 136* 113* 119* 126*  BUN 85* 81* 84* 86* 91* 103*  CREATININE 6.43* 6.44* 6.85* 6.72* 6.67* 7.21*  ALBUMIN 2.1* 2.3* 2.5* 2.3* 2.3* 2.3*  CALCIUM 7.5* 7.5* 7.9* 7.9* 8.2* 8.0*  PHOS 5.8* 5.9* 6.6* 7.0* 7.0* 8.1*   Liver Function Tests: Recent Labs  Lab 12/07/19 0522 12/08/19 0546 12/10/19 0352  ALBUMIN 2.3* 2.3* 2.3*   No results for input(s): LIPASE, AMYLASE in the last 168 hours. No results for input(s): AMMONIA in the last 168 hours. CBC: Recent Labs  Lab 12/05/19 0353 12/05/19 0353 12/06/19 1315 12/06/19 1315 12/07/19 0522 12/08/19 0546 12/10/19 0352  WBC 8.2   < > 9.5   < > 8.0 9.6 8.2  HGB 8.4*   < > 9.0*   < > 8.0* 8.3* 7.9*  HCT  27.7*   < > 29.4*   < > 25.9* 26.7* 25.6*  MCV 95.2  --  94.8  --  95.2 95.0 95.9  PLT 192   < > 212   < > 181 193 171   < > = values in this interval not displayed.   Cardiac Enzymes: No results for input(s): CKTOTAL, CKMB, CKMBINDEX, TROPONINI in the last 168 hours. CBG: Recent Labs  Lab 12/09/19 0751 12/09/19 1148 12/09/19 1607 12/09/19 2015 12/10/19 0811  GLUCAP 120* 134* 132* 110* 114*    Iron Studies: No results for input(s): IRON, TIBC, TRANSFERRIN, FERRITIN in the last 72 hours. Studies/Results: No results found. . carvedilol  25 mg Oral BID WC  . Chlorhexidine Gluconate Cloth  6 each Topical Daily  . darbepoetin (ARANESP) injection - NON-DIALYSIS  100 mcg Subcutaneous Q Mon-1800  . furosemide  40 mg Intravenous Daily  . heparin  5,000 Units Subcutaneous Q8H  . hydrALAZINE  25 mg Oral Q8H  . hydrocerin   Topical BID  . insulin aspart  0-5 Units Subcutaneous QHS  . insulin aspart  0-9 Units Subcutaneous TID WC  . isosorbide mononitrate  15 mg Oral Daily  . multivitamin  1 tablet Oral QHS  . sevelamer carbonate  800 mg Oral TID WC  .  sodium bicarbonate  650 mg Oral TID  . sodium chloride flush  3 mL Intravenous Once  . tamsulosin  0.4 mg Oral QHS    BMET    Component Value Date/Time   NA 145 12/10/2019 0352   K 4.4 12/10/2019 0352   CL 112 (H) 12/10/2019 0352   CO2 22 12/10/2019 0352   GLUCOSE 126 (H) 12/10/2019 0352   BUN 103 (H) 12/10/2019 0352   CREATININE 7.21 (H) 12/10/2019 0352   CALCIUM 8.0 (L) 12/10/2019 0352   GFRNONAA 7 (L) 12/10/2019 0352   GFRAA 9 (L) 12/10/2019 0352   CBC    Component Value Date/Time   WBC 8.2 12/10/2019 0352   RBC 2.67 (L) 12/10/2019 0352   HGB 7.9 (L) 12/10/2019 0352   HCT 25.6 (L) 12/10/2019 0352   HCT 25.5 (L) 11/29/2019 1103   PLT 171 12/10/2019 0352   MCV 95.9 12/10/2019 0352   MCH 29.6 12/10/2019 0352   MCHC 30.9 12/10/2019 0352   RDW 17.5 (H) 12/10/2019 0352   LYMPHSABS 0.7 11/28/2019 1330   MONOABS 0.5  11/28/2019 1330   EOSABS 0.5 11/28/2019 1330   BASOSABS 0.0 11/28/2019 1330     Assessment/Plan: 1. ESRD: due to poorly controlled DM and HTN, medical renal disease.  Plan is to obtain vascular access tomorrow Wednesday 5/12 and initiate HD later that day. 1.7 L urinary output yesterday  5/10. 1. Plan to start HD  tomorrow, is now at ESRD - renal navigator informed of status to help with OP placement  2. VVS consulted for AVF/TDC placement, to be performed 12/11/2019 followed by first HD 2. Acute combined systolic and diastolic CHF: diuresing with improved volume status. Continue with IV lasix 40mg  daily. Cardiology following. Weight 73>70>71.6>71.5kg 5/11 3. Cardiomyopathy with EF of 30-35%  G2DD: Cardiology consulted 1. Lexiscan myoviewrevealed reversible ischemia in LCx territory 2. Plan for Left heart cath after he gets his vascular access and starts dialysis 4. Urgent HTN, improved: on PO Coreg 25mg  BID, PO Hydralazine 25mg  TID, also on Lasix IV 40mg  daily;BP 148/79 today, has improved with meds and diuresis - may be able to wean meds once HD gets started  5. Urinary retention: s/p foley on 12/04/19 due to failed voiding trials. 6. Anemia of CKD: - iron stores low, will give Ferrlecit, started on ESA 7. Secondary PTH: Phos elevated and iPTH 189. Will start binders - Renvela 8. Cellulitis of RLE, improved: UNNA boot in place, s/p Vanc/CTX/flagyl x 3 days and Doxy x10 days 9. PAD: s/p Left BKA  10. IDDM: poorly controlled due to noncompliance. Per primary.   [name], [place], PGY-2 12/10/2019 9:10 AM   Patient seen and examined, agree with above note with above modifications. No plans today.  Then for access and initiation of HD tomorrow.  Also with titration of meds for complications of CKD.  CLIP in process.  Once starts HD will likely d/c diuretic  Corliss Parish, MD 12/10/2019

## 2019-12-10 NOTE — Progress Notes (Signed)
NAMEStevin [name], MRN:  [id number], DOB:  1955-12-16, LOS: 12 ADMISSION DATE:  11/28/2019   Brief History  64 yo male with T2DM, HTN, left AKA and right partial foot amputation who was admitted to IMTS on 4/29 for purulent cellulitis of RLE and found to have severe combined heart failure and CKD 5.  Subjective/Interm history  No overnight events  Significant Hospital Events   4/29 hospital admission 4/30 ortho and neph consulted. Sodium bicarb started 5/1 40mg  lasix>1.6L out. Lopressor started. 5/2 transfused 1U PRBC for hgb of 7. Vanc/rocephin/flagyl stopped>transitioned to doxy. flomax started for urinary retention.  5/3 hgb 8.5. renal function continuing to decline 5/4 cr continuing to rise, cardiology consulted--hydral/imdur started, metop>coreg 5/5 no significant changes. Foley removed however was retaining so foley replaced.  5/6 creatinine plateauing. vasc consulted for permanent access. 5/7 continued renal decline. Creatinine now 6.85. lexiscan, vein mapping. Planning to obtain access for HD on Monday 5/8 no significant events 5/9 no significant changes 5/10 HD access placement pushed back to Wednesday 5/11 creatinine up to 7.2.  Objective   Blood pressure (!) 148/79, pulse 83, temperature 98.3 F (36.8 C), temperature source Oral, resp. rate (!) 22, height 5\' 3"  (1.6 m), weight 71.5 kg, SpO2 92 %.     Intake/Output Summary (Last 24 hours) at 12/10/2019 1210 Last data filed at 12/10/2019 1032 Gross per 24 hour  Intake 710 ml  Output 1150 ml  Net -440 ml   Filed Weights   12/05/19 0412 12/08/19 0554 12/10/19 0640  Weight: 70 kg 71.6 kg 71.5 kg    Examination: GENERAL: in no acute distress CARDIAC: heart RRR.  Significant improvement in RLE edema. PULMONARY: lung sounds diminished. No crackles or rhonchi SKIN: UNA boot to RLE GU: foley  Consults:  Orthopedics-signed off Nephology  Cardiology Vascular surgery Significant Diagnostic Tests:  4/29 R foot  MRI>>no evidence of loculated fluid collection or osteomyelitis  4/29 CXR>>b/l effusions with basilar atelectasis vs pna; vascular congestion with diffuse interstitial opacities suspicious for pulm edema  4/30 CT renal study>>no acute abnormalities in abd/pelvis.  4/30 echocardiogram>>Left ventricular ejection fraction, by estimation, is 30 to 35%. The left ventricle has moderately decreased function. The left ventricle demonstrates global hypokinesis. Diffuse hypokinesis worse in the inferior base .  The left ventricular internal cavity size was mildly dilated. There is moderate left ventricular hypertrophy. Left ventricular diastolic parameters are consistent with Grade II diastolic dysfunction (pseudonormalization). Elevated left ventricular end-diastolic pressure. Right ventricular systolic function is moderately reduced. The right ventricular size is mildly enlarged. The mitral valve is normal in structure. Mild mitral valve egurgitation. No evidence of mitral stenosis. The aortic valve is tricuspid. Aortic valve regurgitation is not visualized. Mild aortic valve sclerosis is present, with no evidence of aortic valve stenosis. The inferior vena cava is dilated in size with <50% respiratory variability, suggesting right atrial pressure of 15 mmHg.   5/5 cardiac amyloid scan>not suggestive of amyloid 5/7 lexiscan>large severe apical and inferolateral perfusion defect which is reversible suggestive of possible LCx territory ischemia. LVEF 31% with severe global hypokinesia and inferolateral hypokinesis to akinesis.  Micro Data:  4/29 blood cultures>>NG 5/2 urine cultures>>NG  Antimicrobials:  Vancomycin 4/29>>5/2 Rocephin 4/29>>5/2 Flagyl 4/29>>5/2 Doxycycline 5/2>>5/8  Summary  64 yo male with T2DM and HTN who was admitted to IMTS on 4/29 for purulent cellulitis of RLE and found to have heart and renal failure. Remaining hospitalized while arranging preparations for HD--access placement  planned for 5/12 after which cardiology will  do a heart cath.  Resolved/Stable hospital problems  Bacturia-received 3d course of vanc/rocephin/flagyl. No growth on urine culture RLE cellulitis. Completed 10d course of abx treatment   Borderline gamma gap--4. SPEP/UPEP normal Normocytic anemia Balance issues/deconditioning. Likely 2/2 right foot cellulitis. PT recommending SNF at this time. 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 Likely due to poorly controlled DM and HTN along with CHF. Progressive renal failure. Creatinine up to 7.2 this morning. Urinary retention. Failed voiding trial on 5/5 so foley had to be replaced. Plan: nephrology managing. AVF/AVG/TDC placement on Wednesday. Continue bicarb and renvela. Strict I/Os. Continue foley catheter and flomax for urinary retention. Can consider another voiding trial once done with procedures.  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 EF 30-35% with global LV hypokinesis and G2DD. lexiscan showing severe inferolateral and apical defects. Cardiology planning for heart cath once dialysis is initiated. Hypertension. Blood pressures stable PAD. As indicated on 5/4 ABI Plan: cardiology managing. On hydral, imdur, coreg and lasix. Continue tele monitoring, strict I/Os, daily weights.  Acute hypoxic respiratory failure 2/2 volume overload. Continues to be placed back on 1L despite O2 sats being 98+%. Please only place on supplemental O2 if sats<92%. May only require at night but if that is the case, then it should be off during the day.  T2DM. Blood sugars stable on SSI. Continue current regimen.  Best practice:  CODE STATUS: full Diet: cardiac,  renal DVT for prophylaxis: heparin Dispo: pending further medical management. SNF at discharge. Ortho follow up at discharge. Sleep study and palliative care referral at discharge.    [name], MD INTERNAL MEDICINE RESIDENT PGY-1 PAGER #: [phone] 12/10/19  12:10 PM  Labs    CBC Latest Ref Rng & Units 12/10/2019 12/08/2019 12/07/2019  WBC 4.0 - 10.5 K/uL 8.2 9.6 8.0  Hemoglobin 13.0 - 17.0 g/dL 7.9(L) 8.3(L) 8.0(L)  Hematocrit 39.0 - 52.0 % 25.6(L) 26.7(L) 25.9(L)  Platelets 150 - 400 K/uL 171 193 181   BMP Latest Ref Rng & Units 12/10/2019 12/08/2019 12/07/2019  Glucose 70 - 99 mg/dL 126(H) 119(H) 113(H)  BUN 8 - 23 mg/dL 103(H) 91(H) 86(H)  Creatinine 0.61 - 1.24 mg/dL 7.21(H) 6.67(H) 6.72(H)  Sodium 135 - 145 mmol/L 145 142 142  Potassium 3.5 - 5.1 mmol/L 4.4 4.4 4.2  Chloride 98 - 111 mmol/L 112(H) 108 109  CO2 22 - 32 mmol/L 22 18(L) 17(L)  Calcium 8.9 - 10.3 mg/dL 8.0(L) 8.2(L) 7.9(L)

## 2019-12-11 ENCOUNTER — Encounter (HOSPITAL_COMMUNITY): Payer: Self-pay | Admitting: Internal Medicine

## 2019-12-11 ENCOUNTER — Inpatient Hospital Stay (HOSPITAL_COMMUNITY)
Admission: EM | Disposition: A | Discharge status: Skilled Nursing Facility | Payer: Self-pay | Source: Home / Self Care | Attending: Internal Medicine

## 2019-12-11 ENCOUNTER — Inpatient Hospital Stay (HOSPITAL_COMMUNITY): Payer: Medicare Other

## 2019-12-11 ENCOUNTER — Inpatient Hospital Stay (HOSPITAL_COMMUNITY): Payer: Medicare Other | Admitting: Anesthesiology

## 2019-12-11 DIAGNOSIS — N185 Chronic kidney disease, stage 5: Secondary | ICD-10-CM

## 2019-12-11 HISTORY — PX: INSERTION OF DIALYSIS CATHETER: SHX1324

## 2019-12-11 HISTORY — PX: AV FISTULA PLACEMENT: SHX1204

## 2019-12-11 LAB — RENAL FUNCTION PANEL
Albumin: 2.4 g/dL — ABNORMAL LOW (ref 3.5–5.0)
Anion gap: 13 (ref 5–15)
BUN: 110 mg/dL — ABNORMAL HIGH (ref 8–23)
CO2: 18 mmol/L — ABNORMAL LOW (ref 22–32)
Calcium: 7.7 mg/dL — ABNORMAL LOW (ref 8.9–10.3)
Chloride: 110 mmol/L (ref 98–111)
Creatinine, Ser: 7.82 mg/dL — ABNORMAL HIGH (ref 0.61–1.24)
GFR calc Af Amer: 8 mL/min — ABNORMAL LOW (ref >60)
GFR calc non Af Amer: 7 mL/min — ABNORMAL LOW (ref >60)
Glucose, Bld: 108 mg/dL — ABNORMAL HIGH (ref 70–99)
Phosphorus: 7.9 mg/dL — ABNORMAL HIGH (ref 2.5–4.6)
Potassium: 4.5 mmol/L (ref 3.5–5.1)
Sodium: 141 mmol/L (ref 135–145)

## 2019-12-11 LAB — TYPE AND SCREEN
ABO/RH(D): O NEG
Antibody Screen: NEGATIVE

## 2019-12-11 LAB — GLUCOSE, CAPILLARY
Glucose-Capillary: 109 mg/dL — ABNORMAL HIGH (ref 70–99)
Glucose-Capillary: 102 mg/dL — ABNORMAL HIGH (ref 70–99)
Glucose-Capillary: 107 mg/dL — ABNORMAL HIGH (ref 70–99)
Glucose-Capillary: 98 mg/dL (ref 70–99)
Glucose-Capillary: 149 mg/dL — ABNORMAL HIGH (ref 70–99)

## 2019-12-11 SURGERY — ARTERIOVENOUS (AV) FISTULA CREATION
Anesthesia: Monitor Anesthesia Care | Site: Arm Upper | Laterality: Left

## 2019-12-11 MED ORDER — FENTANYL CITRATE (PF) 250 MCG/5ML IJ SOLN
INTRAMUSCULAR | Status: AC
  Filled 2019-12-11: qty 5

## 2019-12-11 MED ORDER — MIDAZOLAM HCL 5 MG/5ML IJ SOLN
INTRAMUSCULAR | Status: DC | PRN
  Administered 2019-12-11 (×2): 1 mg via INTRAVENOUS

## 2019-12-11 MED ORDER — SODIUM CHLORIDE 0.9 % IV SOLN: INTRAVENOUS | Status: DC

## 2019-12-11 MED ORDER — CEFAZOLIN SODIUM-DEXTROSE 2-4 GM/100ML-% IV SOLN
INTRAVENOUS | Status: AC
  Filled 2019-12-11: qty 100

## 2019-12-11 MED ORDER — LIDOCAINE-EPINEPHRINE 0.5 %-1:200000 IJ SOLN
INTRAMUSCULAR | Status: DC | PRN
  Administered 2019-12-11: 50 mL

## 2019-12-11 MED ORDER — HEPARIN SODIUM (PORCINE) 10000 UNIT/ML IJ SOLN
INTRAMUSCULAR | Status: DC | PRN
  Administered 2019-12-11: 3.8 mL via SUBCUTANEOUS

## 2019-12-11 MED ORDER — HYDROMORPHONE HCL 2 MG PO TABS: 1.0000 mg | ORAL_TABLET | Freq: Four times a day (QID) | ORAL | Status: DC | PRN

## 2019-12-11 MED ORDER — 0.9 % SODIUM CHLORIDE (POUR BTL) OPTIME
TOPICAL | Status: DC | PRN
  Administered 2019-12-11: 14:00:00 1000 mL

## 2019-12-11 MED ORDER — CEFAZOLIN SODIUM-DEXTROSE 2-4 GM/100ML-% IV SOLN
2.0000 g | Freq: Once | INTRAVENOUS | Status: AC
  Administered 2019-12-11: 2 g via INTRAVENOUS

## 2019-12-11 MED ORDER — MIDAZOLAM HCL 2 MG/2ML IJ SOLN
INTRAMUSCULAR | Status: AC
  Filled 2019-12-11: qty 2

## 2019-12-11 MED ORDER — SODIUM CHLORIDE 0.9 % IV SOLN
INTRAVENOUS | Status: AC
  Filled 2019-12-11: qty 1.2

## 2019-12-11 MED ORDER — LIDOCAINE-EPINEPHRINE 0.5 %-1:200000 IJ SOLN
INTRAMUSCULAR | Status: AC
  Filled 2019-12-11: qty 1

## 2019-12-11 MED ORDER — ONDANSETRON HCL 4 MG/2ML IJ SOLN
4.0000 mg | Freq: Four times a day (QID) | INTRAMUSCULAR | Status: DC | PRN
  Filled 2019-12-11: qty 2

## 2019-12-11 MED ORDER — PHENYLEPHRINE HCL-NACL 10-0.9 MG/250ML-% IV SOLN
INTRAVENOUS | Status: DC | PRN
  Administered 2019-12-11: 20 ug/min via INTRAVENOUS

## 2019-12-11 MED ORDER — HEPARIN SODIUM (PORCINE) 1000 UNIT/ML IJ SOLN
INTRAMUSCULAR | Status: AC
  Filled 2019-12-11: qty 1

## 2019-12-11 MED ORDER — PROPOFOL 500 MG/50ML IV EMUL
INTRAVENOUS | Status: DC | PRN
  Administered 2019-12-11: 50 ug/kg/min via INTRAVENOUS

## 2019-12-11 MED ORDER — ONDANSETRON HCL 4 MG/2ML IJ SOLN
INTRAMUSCULAR | Status: DC | PRN
  Administered 2019-12-11: 4 mg via INTRAVENOUS

## 2019-12-11 MED ORDER — ONDANSETRON HCL 4 MG/2ML IJ SOLN
INTRAMUSCULAR | Status: AC
  Filled 2019-12-11: qty 2

## 2019-12-11 MED ORDER — SODIUM CHLORIDE 0.9 % IV SOLN
INTRAVENOUS | Status: DC | PRN
  Administered 2019-12-11: 500 mL

## 2019-12-11 SURGICAL SUPPLY — 59 items
ARMBAND PINK RESTRICT EXTREMIT (MISCELLANEOUS) ×6 IMPLANT
BAG DECANTER FOR FLEXI CONT (MISCELLANEOUS) ×4 IMPLANT
BIOPATCH RED 1 DISK 7.0 (GAUZE/BANDAGES/DRESSINGS) ×3 IMPLANT
BIOPATCH RED 1IN DISK 7.0MM (GAUZE/BANDAGES/DRESSINGS) ×1
CANISTER SUCT 3000ML PPV (MISCELLANEOUS) ×4 IMPLANT
CANNULA VESSEL 3MM 2 BLNT TIP (CANNULA) ×4 IMPLANT
CATH PALINDROME-P 19CM W/VT (CATHETERS) IMPLANT
CATH PALINDROME-P 23CM W/VT (CATHETERS) ×2 IMPLANT
CATH PALINDROME-P 28CM W/VT (CATHETERS) IMPLANT
CLIP LIGATING EXTRA MED SLVR (CLIP) ×4 IMPLANT
CLIP LIGATING EXTRA SM BLUE (MISCELLANEOUS) ×4 IMPLANT
COVER PROBE W GEL 5X96 (DRAPES) ×8 IMPLANT
COVER SURGICAL LIGHT HANDLE (MISCELLANEOUS) ×4 IMPLANT
COVER WAND RF STERILE (DRAPES) ×4 IMPLANT
DECANTER SPIKE VIAL GLASS SM (MISCELLANEOUS) ×8 IMPLANT
DERMABOND ADVANCED (GAUZE/BANDAGES/DRESSINGS) ×2
DERMABOND ADVANCED .7 DNX12 (GAUZE/BANDAGES/DRESSINGS) ×4 IMPLANT
DRAPE C-ARM 42X72 X-RAY (DRAPES) ×4 IMPLANT
DRAPE CHEST BREAST 15X10 FENES (DRAPES) ×4 IMPLANT
DRSG COVADERM PLUS 2X2 (GAUZE/BANDAGES/DRESSINGS) ×2 IMPLANT
ELECT REM PT RETURN 9FT ADLT (ELECTROSURGICAL) ×4
ELECTRODE REM PT RTRN 9FT ADLT (ELECTROSURGICAL) ×2 IMPLANT
GAUZE 4X4 16PLY RFD (DISPOSABLE) ×4 IMPLANT
GLOVE BIOGEL PI IND STRL 6.5 (GLOVE) IMPLANT
GLOVE BIOGEL PI IND STRL 7.0 (GLOVE) IMPLANT
GLOVE BIOGEL PI INDICATOR 6.5 (GLOVE) ×4
GLOVE BIOGEL PI INDICATOR 7.0 (GLOVE) ×4
GLOVE INDICATOR 7.0 STRL GRN (GLOVE) ×2 IMPLANT
GLOVE SS BIOGEL STRL SZ 7.5 (GLOVE) ×4 IMPLANT
GLOVE SUPERSENSE BIOGEL SZ 7.5 (GLOVE) ×4
GLOVE SURG SS PI 6.5 STRL IVOR (GLOVE) ×2 IMPLANT
GOWN STRL REUS W/ TWL LRG LVL3 (GOWN DISPOSABLE) ×10 IMPLANT
GOWN STRL REUS W/TWL LRG LVL3 (GOWN DISPOSABLE) ×10
KIT BASIN OR (CUSTOM PROCEDURE TRAY) ×8 IMPLANT
KIT PALINDROME-P 55CM (CATHETERS) IMPLANT
KIT TURNOVER KIT B (KITS) ×8 IMPLANT
NDL 18GX1X1/2 (RX/OR ONLY) (NEEDLE) ×2 IMPLANT
NDL HYPO 25GX1X1/2 BEV (NEEDLE) ×2 IMPLANT
NEEDLE 18GX1X1/2 (RX/OR ONLY) (NEEDLE) ×4 IMPLANT
NEEDLE 22X1 1/2 (OR ONLY) (NEEDLE) IMPLANT
NEEDLE HYPO 25GX1X1/2 BEV (NEEDLE) ×4 IMPLANT
NS IRRIG 1000ML POUR BTL (IV SOLUTION) ×8 IMPLANT
PACK CV ACCESS (CUSTOM PROCEDURE TRAY) ×4 IMPLANT
PACK SURGICAL SETUP 50X90 (CUSTOM PROCEDURE TRAY) ×4 IMPLANT
PAD ARMBOARD 7.5X6 YLW CONV (MISCELLANEOUS) ×16 IMPLANT
SOAP 2 % CHG 4 OZ (WOUND CARE) ×4 IMPLANT
SUT ETHILON 3 0 PS 1 (SUTURE) ×4 IMPLANT
SUT PROLENE 6 0 CC (SUTURE) ×6 IMPLANT
SUT VIC AB 3-0 SH 27 (SUTURE) ×2
SUT VIC AB 3-0 SH 27X BRD (SUTURE) ×2 IMPLANT
SUT VICRYL 4-0 PS2 18IN ABS (SUTURE) ×4 IMPLANT
SYR 10ML LL (SYRINGE) ×4 IMPLANT
SYR 20ML LL LF (SYRINGE) ×4 IMPLANT
SYR 5ML LL (SYRINGE) ×8 IMPLANT
SYR CONTROL 10ML LL (SYRINGE) ×4 IMPLANT
TOWEL GREEN STERILE (TOWEL DISPOSABLE) ×12 IMPLANT
TOWEL GREEN STERILE FF (TOWEL DISPOSABLE) ×4 IMPLANT
UNDERPAD 30X36 HEAVY ABSORB (UNDERPADS AND DIAPERS) ×4 IMPLANT
WATER STERILE IRR 1000ML POUR (IV SOLUTION) ×8 IMPLANT

## 2019-12-11 NOTE — Progress Notes (Addendum)
[facility] Daily Progress Note  S: Patient seen this morning resting comfortably in bed, states he is feeling pretty good, scheduled to have fistula for HD access and tunneled catheter placed this morning.  Does not have any questions at the time.  Confirms he is n.p.o.  Seen after procedure - no c/o's -  HD needed to be bumped to Thursday 5/13   O:BP (!) 141/77 (BP Location: Left Arm)   Pulse 88   Temp 98.2 F (36.8 C) (Oral)   Resp (!) 23   Ht 5\' 3"  (1.6 m)   Wt 71.6 kg   SpO2 96%   BMI 27.96 kg/m   Intake/Output Summary (Last 24 hours) at 12/11/2019 0916 Last data filed at 12/11/2019 0556 Gross per 24 hour  Intake 940 ml  Output 450 ml  Net 490 ml   Intake/Output: I/O last 3 completed shifts: In: 7829 [P.O.:1080; IV Piggyback:100] Out: 1600 [Urine:1600]  Intake/Output this shift:  No intake/output data recorded. 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 New left AVF-  Positive thrill-  New right sided [facility]   Recent Labs  Lab 12/05/19 0353 12/06/19 1315 12/07/19 0522 12/08/19 0546 12/10/19 0352 12/11/19 0407  NA 139 140 142 142 145 141  K 3.8 4.1 4.2 4.4 4.4 4.5  CL 112* 109 109 108 112* 110  CO2 19* 18* 17* 18* 22 18*  GLUCOSE 128* 136* 113* 119* 126* 108*  BUN 81* 84* 86* 91* 103* 110*  CREATININE 6.44* 6.85* 6.72* 6.67* 7.21* 7.82*  ALBUMIN 2.3* 2.5* 2.3* 2.3* 2.3* 2.4*  CALCIUM 7.5* 7.9* 7.9* 8.2* 8.0* 7.7*  PHOS 5.9* 6.6* 7.0* 7.0* 8.1* 7.9*   Liver Function Tests: Recent Labs  Lab 12/08/19 0546 12/10/19 0352 12/11/19 0407  ALBUMIN 2.3* 2.3* 2.4*   No results for input(s):  LIPASE, AMYLASE in the last 168 hours. No results for input(s): AMMONIA in the last 168 hours. CBC: Recent Labs  Lab 12/05/19 0353 12/05/19 0353 12/06/19 1315 12/06/19 1315 12/07/19 0522 12/08/19 0546 12/10/19 0352  WBC 8.2   < > 9.5   < > 8.0 9.6 8.2  HGB 8.4*   < > 9.0*   < > 8.0* 8.3* 7.9*  HCT 27.7*   < > 29.4*   < > 25.9* 26.7* 25.6*  MCV 95.2  --  94.8  --  95.2 95.0 95.9  PLT 192   < > 212   < > 181 193 171   < > = values in this interval not displayed.   Cardiac Enzymes: No results for input(s): CKTOTAL, CKMB, CKMBINDEX, TROPONINI in the last 168 hours. CBG: Recent Labs  Lab 12/10/19 0811 12/10/19 1151 12/10/19 1645 12/10/19 2209 12/11/19 0815  GLUCAP 114* 120* 142* 122* 109*    Iron Studies: No results for input(s): IRON, TIBC, TRANSFERRIN, FERRITIN in the last 72 hours. Studies/Results: No results found. . carvedilol  25 mg Oral BID WC  . Chlorhexidine Gluconate Cloth  6 each Topical Daily  . Chlorhexidine Gluconate Cloth  6 each Topical Q0600  . darbepoetin (ARANESP) injection - NON-DIALYSIS  100 mcg Subcutaneous Q Mon-1800  . furosemide  40 mg Intravenous Daily  .  heparin  5,000 Units Subcutaneous Q8H  . hydrALAZINE  25 mg Oral Q8H  . hydrocerin   Topical BID  . insulin aspart  0-5 Units Subcutaneous QHS  . insulin aspart  0-9 Units Subcutaneous TID WC  . isosorbide mononitrate  15 mg Oral Daily  . multivitamin  1 tablet Oral QHS  . sevelamer carbonate  800 mg Oral TID WC  . sodium bicarbonate  650 mg Oral TID  . sodium chloride flush  3 mL Intravenous Once  . tamsulosin  0.4 mg Oral QHS    BMET    Component Value Date/Time   NA 141 12/11/2019 0407   K 4.5 12/11/2019 0407   CL 110 12/11/2019 0407   CO2 18 (L) 12/11/2019 0407   GLUCOSE 108 (H) 12/11/2019 0407   BUN 110 (H) 12/11/2019 0407   CREATININE 7.82 (H) 12/11/2019 0407   CALCIUM 7.7 (L) 12/11/2019 0407   GFRNONAA 7 (L) 12/11/2019 0407   GFRAA 8 (L) 12/11/2019 0407   CBC     Component Value Date/Time   WBC 8.2 12/10/2019 0352   RBC 2.67 (L) 12/10/2019 0352   HGB 7.9 (L) 12/10/2019 0352   HCT 25.6 (L) 12/10/2019 0352   HCT 25.5 (L) 11/29/2019 1103   PLT 171 12/10/2019 0352   MCV 95.9 12/10/2019 0352   MCH 29.6 12/10/2019 0352   MCHC 30.9 12/10/2019 0352   RDW 17.5 (H) 12/10/2019 0352   LYMPHSABS 0.7 11/28/2019 1330   MONOABS 0.5 11/28/2019 1330   EOSABS 0.5 11/28/2019 1330   BASOSABS 0.0 11/28/2019 1330    Assessment/Plan: 1. ESRD:due to poorly controlled DM and HTN, medical renal disease.    Patient to obtain vascular access (fistula, tunnel catheter) today, initiate HD this afternoon. 940 urinary output yesterday. 1. Plan to start HD tomorrow, is now at ESRD - renal navigator informed of status to help with OP placement.  Tentatively has a spot at [facility] tts but pt may need rehab prior to going home   2. VVS consultedfor AVF/TDC placementto be performed 12/11/2019 followed by first HD 2. Acute combined systolic and diastolic JXB:JYNWGNFAO with improved volume status. Continue with IV lasix40mg  daily. Cardiology following.Weight 73>>71.5kg 5/11 3. Cardiomyopathy with EF of 30-35% G2DD:Cardiology consulted 1. Lexiscan myoviewrevealed reversible ischemia in LCx territory 2. Plan for Left heart cath after he gets his vascular access and starts dialysis 4. Urgent HTN, improved:on PO Coreg 25mg  BID, PO Hydralazine 25mg  TID, also on Lasix IV 40mg  daily;BP 146/77 today, has improved with meds and diuresis - may be able to wean meds once HD gets started 5. Urinary retention:foley placed 12/04/19, remains in place, due to failed voiding trials. 6. Anemia of ZHY:QMVH stores low, given Ferrlecit, started on ESA- cont to be an issue  7. Secondary QIO:NGEX elevated and iPTH 189. Renvela- will see what it does after HD  8. Cellulitis of RLE, improved:UNNA boot in place, s/p Vanc/CTX/flagyl x 3 days, s/p Doxy x10 days 9. PAD:s/p Left BKA   10. 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 Per primary.  Milus Banister, Lamb, PGY-2 12/11/2019 11:21 AM   Patient seen and examined, agree with above note with above modifications. Seen after procedure- NAD-  First HD treatment got bumped to 5/13-  Then next on Sat-  Tentatively has OP spot at [facility] TTS but may need to change based on dispo- phos still high and hgb still low-  Want to see how phos responds to initiation of HD-  Cont esa and iron  Corliss Parish, MD 12/11/2019

## 2019-12-11 NOTE — Transfer of Care (Signed)
Immediate Anesthesia Transfer of Care Note  Patient: [name]  Procedure(s) Performed: LEFT BRACHIOCEPHALIC FISTULA CREATION (Left Arm Upper) Insertion Of Dialysis Catheter  Patient Location: PACU  Anesthesia Type:MAC  Level of Consciousness: awake, alert , oriented and patient cooperative  Airway & Oxygen Therapy: Patient Spontanous Breathing and Patient connected to nasal cannula oxygen  Post-op Assessment: Report given to RN and Post -op Vital signs reviewed and stable  Post vital signs: Reviewed and stable  Last Vitals:  Vitals Value Taken Time  BP 124/73 12/11/19 1452  Temp    Pulse 82 12/11/19 1454  Resp 13 12/11/19 1454  SpO2 92 % 12/11/19 1454  Vitals shown include unvalidated device data.  Last Pain:  Vitals:   12/11/19 1205  TempSrc:   PainSc: 0-No pain         Complications: No apparent anesthesia complications

## 2019-12-11 NOTE — Progress Notes (Signed)
Patient has been accepted for OP HD treatment at [facility] on a TTS schedule with a seat time of 12:50pm. He needs to arrive at 12:30pm.  On his first day at the clinic, he needs to arrive at 11:50am to complete intake paperwork prior to his first treatment. Navigator will follow up with CSW and patient regarding above.  Alphonzo Cruise,  Renal Navigator [phone]

## 2019-12-11 NOTE — Anesthesia Preprocedure Evaluation (Signed)
Anesthesia Evaluation  Patient identified by MRN, date of birth, ID band Patient awake    Reviewed: Allergy & Precautions, H&P , NPO status , Patient's Chart, lab work & pertinent test results  Airway Mallampati: II   Neck ROM: full    Dental   Pulmonary shortness of breath,    breath sounds clear to auscultation       Cardiovascular hypertension,  Rhythm:regular Rate:Normal     Neuro/Psych  Neuromuscular disease    GI/Hepatic (+) Hepatitis -  Endo/Other  diabetes, Type 2  Renal/GU ESRFRenal disease     Musculoskeletal   Abdominal   Peds  Hematology   Anesthesia Other Findings   Reproductive/Obstetrics                             Anesthesia Physical Anesthesia Plan  ASA: III  Anesthesia Plan: MAC   Post-op Pain Management:    Induction: Intravenous  PONV Risk Score and Plan: 1 and Propofol infusion and Treatment may vary due to age or medical condition  Airway Management Planned: Simple Face Mask  Additional Equipment:   Intra-op Plan:   Post-operative Plan:   Informed Consent: I have reviewed the patients History and Physical, chart, labs and discussed the procedure including the risks, benefits and alternatives for the proposed anesthesia with the patient or authorized representative who has indicated his/her understanding and acceptance.       Plan Discussed with: CRNA, Anesthesiologist and Surgeon  Anesthesia Plan Comments:         Anesthesia Quick Evaluation

## 2019-12-11 NOTE — Progress Notes (Signed)
Progress Note  Patient Name: [name] Date of Encounter: 12/11/2019  Primary Cardiologist: No primary care provider on file.   Subjective   Plan for HD line today per primary service.  Inpatient Medications    Scheduled Meds: . carvedilol  25 mg Oral BID WC  . Chlorhexidine Gluconate Cloth  6 each Topical Daily  . Chlorhexidine Gluconate Cloth  6 each Topical Q0600  . darbepoetin (ARANESP) injection - NON-DIALYSIS  100 mcg Subcutaneous Q Mon-1800  . furosemide  40 mg Intravenous Daily  . heparin  5,000 Units Subcutaneous Q8H  . hydrALAZINE  25 mg Oral Q8H  . hydrocerin   Topical BID  . insulin aspart  0-5 Units Subcutaneous QHS  . insulin aspart  0-9 Units Subcutaneous TID WC  . isosorbide mononitrate  15 mg Oral Daily  . multivitamin  1 tablet Oral QHS  . sevelamer carbonate  800 mg Oral TID WC  . sodium bicarbonate  650 mg Oral TID  . sodium chloride flush  3 mL Intravenous Once  . tamsulosin  0.4 mg Oral QHS   Continuous Infusions: . ferric gluconate (FERRLECIT/NULECIT) IV 125 mg (12/10/19 0950)   PRN Meds:    Vital Signs    Vitals:   12/10/19 2000 12/10/19 2227 12/11/19 0517 12/11/19 0552  BP: 133/75  (!) 141/77   Pulse: 86 87 88   Resp: 20 (!) 22 (!) 23   Temp:  98.1 F (36.7 C) 98.2 F (36.8 C)   TempSrc:  Oral Oral   SpO2: (!) 89% 96% 96%   Weight:    71.6 kg  Height:        Intake/Output Summary (Last 24 hours) at 12/11/2019 0834 Last data filed at 12/11/2019 0556 Gross per 24 hour  Intake 940 ml  Output 450 ml  Net 490 ml   Last 3 Weights 12/11/2019 12/10/2019 12/08/2019  Weight (lbs) 157 lb 13.6 oz 157 lb 10.1 oz 157 lb 13.6 oz  Weight (kg) 71.6 kg 71.5 kg 71.6 kg      Telemetry    Sinus rhythm- Personally Reviewed  ECG    Physical Exam   GEN: No acute distress.   Neck: No JVD Cardiac: RRR, soft systolic murmur, no rubs, or gallops.  Respiratory: Clear to auscultation bilaterally. GI: Soft, nontender, non-distended  MS:  Mild  edema; right lower extremity toe amputation, left lower extremity leg amputation Neuro:  Nonfocal  Psych: Normal affect   Labs    High Sensitivity Troponin:  No results for input(s): TROPONINIHS in the last 720 hours.    Chemistry Recent Labs  Lab 12/08/19 0546 12/10/19 0352 12/11/19 0407  NA 142 145 141  K 4.4 4.4 4.5  CL 108 112* 110  CO2 18* 22 18*  GLUCOSE 119* 126* 108*  BUN 91* 103* 110*  CREATININE 6.67* 7.21* 7.82*  CALCIUM 8.2* 8.0* 7.7*  ALBUMIN 2.3* 2.3* 2.4*  GFRNONAA 8* 7* 7*  GFRAA 9* 9* 8*  ANIONGAP 16* 11 13     Hematology Recent Labs  Lab 12/07/19 0522 12/08/19 0546 12/10/19 0352  WBC 8.0 9.6 8.2  RBC 2.72* 2.81* 2.67*  HGB 8.0* 8.3* 7.9*  HCT 25.9* 26.7* 25.6*  MCV 95.2 95.0 95.9  MCH 29.4 29.5 29.6  MCHC 30.9 31.1 30.9  RDW 17.9* 17.6* 17.5*  PLT 181 193 171    BNPNo results for input(s): BNP, PROBNP in the last 168 hours.   DDimer No results for input(s): DDIMER in the last 168  hours.   Radiology    No results found.  Cardiac Studies   1. Large left pleural effusion with atelectatic lung tissue seen.  2. Diffuse hypokinesis worse in the inferior base . Left ventricular  ejection fraction, by estimation, is 30 to 35%. The left ventricle has  moderately decreased function. The left ventricle demonstrates global  hypokinesis. The left ventricular internal  cavity size was mildly dilated. There is moderate left ventricular  hypertrophy. Left ventricular diastolic parameters are consistent with  Grade II diastolic dysfunction (pseudonormalization). Elevated left  ventricular end-diastolic pressure.  3. Right ventricular systolic function is moderately reduced. The right  ventricular size is mildly enlarged.  4. The mitral valve is normal in structure. Mild mitral valve  regurgitation. No evidence of mitral stenosis.  5. The aortic valve is tricuspid. Aortic valve regurgitation is not  visualized. Mild aortic valve sclerosis is  present, with no evidence of  aortic valve stenosis.  6. The inferior vena cava is dilated in size with <50% respiratory  variability, suggesting right atrial pressure of 15 mmHg.   Patient Profile     64 y.o. 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  Assessment & Plan    # acute systolic heart failure Diuretics per nephrology HD planned with line placement today Patient will need coronary angiography after HD has been established.  The patient and I discussed today that this could occur on Friday if dialysis is successfully established. Medical therapy with Coreg 25 mg twice daily, hydralazine 25 mg 3 times daily, Imdur 50 mg daily.     For questions or updates, please contact [place] Please consult [email or web address] for contact info under        Signed, Elouise Munroe, MD  12/11/2019, 8:34 AM

## 2019-12-11 NOTE — Interval H&P Note (Signed)
History and Physical Interval Note:  12/11/2019 12:13 PM  [name]  has presented today for surgery, with the diagnosis of END STAGE RENAL DISEASE.  The various methods of treatment have been discussed with the patient and family. After consideration of risks, benefits and other options for treatment, the patient has consented to  Procedure(s): LEFT UPPER EXTREMITY ARTERIOVENOUS (AV) FISTULA CREATION VERSES ARTERIOVENOUS GRAFT (Left) as a surgical intervention.  The patient's history has been reviewed, patient examined, no change in status, stable for surgery.  I have reviewed the patient's chart and labs.  Questions were answered to the patient's satisfaction.     Curt Jews

## 2019-12-11 NOTE — Op Note (Signed)
    OPERATIVE REPORT  DATE OF SURGERY: 12/11/2019  PATIENT: [name], 64 y.o. male MRN: [id number]  DOB: 1956-04-22  PRE-OPERATIVE DIAGNOSIS: End-stage renal disease  POST-OPERATIVE DIAGNOSIS:  Same  PROCEDURE: #1 right IJ tunneled hemodialysis catheter, #2 left brachiocephalic AV fistula creation  SURGEON:  Curt Jews, M.D.  PHYSICIAN ASSISTANT: Liana Crocker, PA-C  ANESTHESIA: Local with sedation  EBL: per anesthesia record  Total I/O In: 100 [IV Piggyback:100] Out: 525 [Urine:505; Blood:20]  BLOOD ADMINISTERED: none  DRAINS: none  SPECIMEN: none  COUNTS CORRECT:  YES  PATIENT DISPOSITION:  PACU - hemodynamically stable  PROCEDURE DETAILS: The patient was taken operating placed to position where the area of the right and left neck and chest prepped in the usual fashion.  Using SonoSite ultrasound and local anesthesia the right internal jugular vein was entered directly.  A guidewire was passed centrally and this was confirmed with fluoroscopy.  Dilator and peel-away sheath was passed over the guidewire.  A separate stab incision was made with appropriate level in the 23 cm.the catheter was brought through the tunnel to the level of the entry.  The catheter was brought through the peel-away sheath and the peel-away sheath was removed.  The catheter tips were positioned at the level of the distal right atrium.  The catheter was secured to the skin with 3-0 nylon stitch and the entry site was closed with 4-0 subcuticular Vicryl stitch.  Sterile dressing was applied.  Next the left arm was prepped draped in sterile fashion.  SonoSite ultrasound was used to visualize the vein.  Patient had an excellent caliber cephalic vein from the antecubital space proximally.  Using local anesthesia incision was made over the antecubital space and carried down to isolate the brachial artery and the cephalic vein.  The artery was very good caliber and the cephalic vein was good-sized as  well.  The vein was ligated distally and divided.  The vein was brought to approximation of the brachial artery.  The artery was occluded proximally distally was opened 11 blade and sent longitudinally with Potts scissors.  The vein was cut to the appropriate length and was spatulated and sewn end-to-side to the artery with a running 6-0 Prolene suture.  Prior to completion of the anastomosis the usual flushing maneuvers were undertaken.  Anastomosis completed and clamps removed.  The patient had an excellent thrill through the vein.  The patient had biphasic Doppler flow at the wrist and this was slightly augmented with compression of the vein.  The wounds irrigated with saline.  Hemostasis cautery.  The wounds were closed with 3-0 Vicryl in the subcutaneous subcuticular tissue.  Sterile dressing was applied and the patient was transferred to the recovery room in stable condition   Rosetta Posner, M.D., [facility] 12/11/2019 2:57 PM

## 2019-12-11 NOTE — Anesthesia Procedure Notes (Signed)
Procedure Name: MAC Date/Time: 12/11/2019 1:00 PM Performed by: Renato Shin, CRNA Pre-anesthesia Checklist: Patient identified, Emergency Drugs available, Suction available and Patient being monitored Patient Re-evaluated:Patient Re-evaluated prior to induction Oxygen Delivery Method: Simple face mask Preoxygenation: Pre-oxygenation with 100% oxygen Induction Type: IV induction Placement Confirmation: positive ETCO2 and breath sounds checked- equal and bilateral Dental Injury: Teeth and Oropharynx as per pre-operative assessment

## 2019-12-11 NOTE — Progress Notes (Signed)
Patients ring removed and sent with Steffanie Dunn, RN back to [address]

## 2019-12-11 NOTE — Progress Notes (Signed)
PT Cancellation Note  Patient Details Name: [name] MRN: [id number] DOB: [birth year]   Cancelled Treatment:    Reason Eval/Treat Not Completed: Patient at procedure or test/unavailable.  PT will check back tomorrow.  Thanks,  Verdene Lennert, PT, DPT  Acute Rehabilitation [phone] pager #[phone] office       Wells Guiles B Tylasia Fletchall 12/11/2019, 2:50 PM

## 2019-12-11 NOTE — Discharge Instructions (Signed)
   [facility]  Discharge Instructions  AV Fistula or Graft Surgery for Dialysis Access  Please refer to the following instructions for your post-procedure care. Your surgeon or physician assistant will discuss any changes with you.  Activity  You may drive the day following your surgery, if you are comfortable and no longer taking prescription pain medication. Resume full activity as the soreness in your incision resolves.  Bathing/Showering  You may shower after you go home. Keep your incision dry for 48 hours. Do not soak in a bathtub, hot tub, or swim until the incision heals completely. You may not shower if you have a hemodialysis catheter.  Incision Care  Clean your incision with mild soap and water after 48 hours. Pat the area dry with a clean towel. You do not need a bandage unless otherwise instructed. Do not apply any ointments or creams to your incision. You may have skin glue on your incision. Do not peel it off. It will come off on its own in about one week. Your arm may swell a bit after surgery. To reduce swelling use pillows to elevate your arm so it is above your heart. Your doctor will tell you if you need to lightly wrap your arm with an ACE bandage.  Diet  Resume your normal diet. There are not special food restrictions following this procedure. In order to heal from your surgery, it is CRITICAL to get adequate nutrition. Your body requires vitamins, minerals, and protein. Vegetables are the best source of vitamins and minerals. Vegetables also provide the perfect balance of protein. Processed food has little nutritional value, so try to avoid this.  Medications  Resume taking all of your medications. If your incision is causing pain, you may take over-the counter pain relievers such as acetaminophen (Tylenol). If you were prescribed a stronger pain medication, please be aware these medications can cause nausea and constipation. Prevent  nausea by taking the medication with a snack or meal. Avoid constipation by drinking plenty of fluids and eating foods with high amount of fiber, such as fruits, vegetables, and grains.  Do not take Tylenol if you are taking prescription pain medications.  Follow up Your surgeon may want to see you in the office following your access surgery. If so, this will be arranged at the time of your surgery.  Please call us immediately for any of the following conditions:  . Increased pain, redness, drainage (pus) from your incision site . Fever of 101 degrees or higher . Severe or worsening pain at your incision site . Hand pain or numbness. .  Reduce your risk of vascular disease:  . Stop smoking. If you would like help, call [facility] at 1-800-QUIT-NOW (205)466-2937) or [place] at [phone]  . Manage your cholesterol . Maintain a desired weight . Control your diabetes . Keep your blood pressure down  Dialysis  It will take several weeks to several months for your new dialysis access to be ready for use. Your surgeon will determine when it is okay to use it. Your nephrologist will continue to direct your dialysis. You can continue to use your Permcath until your new access is ready for use.   12/11/2019 [name] [name], [name]  Surgeon(s): Early, Arvilla Meres, MD  Procedure(s): LEFT BRACHIOCEPHALIC FISTULA CREATION Insertion Of Dialysis Catheter  x Do not stick fistula for 12 weeks    If you have any questions, please call the office at [phone].

## 2019-12-11 NOTE — Progress Notes (Signed)
NAMEMelford [name], MRN:  [id number], DOB:  March 20, 1956, LOS: 68 ADMISSION DATE:  11/28/2019   Brief History  64 yo male with T2DM, HTN, left AKA and right partial foot amputation who was admitted to [facility]MT[facility] on 4/29 for purulent cellulitis of RLE and found to have severe combined heart failure and CKD 5.  Subjective/Interm history  No overnight events. Discussed plan for today  Significant Hospital Events   4/29 hospital admission 4/30 ortho and neph consulted. Sodium bicarb started 5/1 40mg  lasix>1.6L out. Lopressor started. 5/2 transfused 1U PRBC for hgb of 7. Vanc/rocephin/flagyl stopped>transitioned to doxy. flomax started for urinary retention.  5/3 hgb 8.5. renal function continuing to decline 5/4 cr continuing to rise, cardiology consulted--hydral/imdur started, metop>coreg 5/5 no significant changes. Foley removed however was retaining so foley replaced.  5/6 creatinine plateauing. vasc consulted for permanent access. 5/7 continued renal decline. Creatinine now 6.85. lexiscan, vein mapping. Planning to obtain access for HD on Monday 5/8 no significant events 5/9 no significant changes 5/10 HD access placement pushed back to Wednesday 5/11 creatinine up to 7.2. 5/12 HD line placement today  Objective   Blood pressure (!) 146/77, pulse 88, temperature 98.2 F (36.8 C), temperature source Oral, resp. rate 16, height 5\' 3"  (1.6 m), weight 71.6 kg, SpO2 98 %.     Intake/Output Summary (Last 24 hours) at 12/11/2019 1142 Last data filed at 12/11/2019 0556 Gross per 24 hour  Intake 580 ml  Output 450 ml  Net 130 ml   Filed Weights   12/08/19 0554 12/10/19 0640 12/11/19 0552  Weight: 71.6 kg 71.5 kg 71.6 kg    Examination: GENERAL: in no acute distress CARDIAC: RLE edema continues to show significant improvement PULMONARY: breathing comfortably on room air SKIN: UNA boot to RLE GU: foley  Consults:  Orthopedics-signed off Nephology  Cardiology Vascular  surgery Significant Diagnostic Tests:  4/29 R foot MRI>>no evidence of loculated fluid collection or osteomyelitis  4/29 CXR>>b/l effusions with basilar atelectasis vs pna; vascular congestion with diffuse interstitial opacities suspicious for pulm edema  4/30 CT renal study>>no acute abnormalities in abd/pelvis.  4/30 echocardiogram>>Left ventricular ejection fraction, by estimation, is 30 to 35%. The left ventricle has moderately decreased function. The left ventricle demonstrates global hypokinesis. Diffuse hypokinesis worse in the inferior base .  The left ventricular internal cavity size was mildly dilated. There is moderate left ventricular hypertrophy. Left ventricular diastolic parameters are consistent with Grade II diastolic dysfunction (pseudonormalization). Elevated left ventricular end-diastolic pressure. Right ventricular systolic function is moderately reduced. The right ventricular size is mildly enlarged. The mitral valve is normal in structure. Mild mitral valve egurgitation. No evidence of mitral stenosis. The aortic valve is tricuspid. Aortic valve regurgitation is not visualized. Mild aortic valve sclerosis is present, with no evidence of aortic valve stenosis. The inferior vena cava is dilated in size with <50% respiratory variability, suggesting right atrial pressure of 15 mmHg.   5/5 cardiac amyloid scan>not suggestive of amyloid 5/7 lexiscan>large severe apical and inferolateral perfusion defect which is reversible suggestive of possible LCx territory ischemia. LVEF 31% with severe global hypokinesia and inferolateral hypokinesis to akinesis.  Micro Data:  4/29 blood cultures>>NG 5/2 urine cultures>>NG  Antimicrobials:  Vancomycin 4/29>>5/2 Rocephin 4/29>>5/2 Flagyl 4/29>>5/2 Doxycycline 5/2>>5/8  Summary  64 yo male with T2DM and HTN who was admitted to IMTS on 4/29 for purulent cellulitis of RLE and found to have heart and renal failure. Remaining hospitalized while  arranging preparations for HD--access placement planned for  5/12 after which cardiology will do a heart cath.  Resolved/Stable hospital problems  Bacturia-received 3d course of vanc/rocephin/flagyl. No growth on urine culture RLE cellulitis. Completed 10d course of abx treatment   Borderline gamma gap--4. SPEP/UPEP normal Normocytic anemia Balance issues/deconditioning. Likely 2/2 right foot cellulitis. PT recommending SNF at this time. Assessment & Plan:  Principal Problem:   Type 2 diabetes mellitus with diabetic foot infection ([place]) Active Problems:   Renal failure   Anemia   Uncontrolled hypertension   CKD (chronic kidney disease), stage V (HCC)   Shortness of breath   Pressure injury of skin   Hypoxia   CKD V with metabolic acidosis. Likely due to poorly controlled DM and HTN along with CHF. Progressive renal failure. Creatinine up to 7.2 this morning. Urinary retention. Failed voiding trial on 5/5 so foley had to be replaced. Plan: nephrology managing. AVF/AVG/TDC placement today. Continue bicarb and renvela. Strict I/Os. Continue foley catheter and flomax for urinary retention. Can consider another voiding trial once done with procedures.  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 EF 30-35% with global LV hypokinesis and G2DD. lexiscan showing severe inferolateral and apical defects. Cardiology planning for heart cath once dialysis is initiated. Hypertension. Blood pressures stable PAD. As indicated on 5/4 ABI Plan: cardiology managing. On hydral, imdur, coreg and lasix. Continue tele monitoring, strict I/Os, daily weights. Possible heart cath tomorrow.  Acute hypoxic respiratory failure now on room air.  T2DM. Blood sugars are remaining stable on SSI.  Best practice:  CODE STATUS: full Diet: cardiac, renal DVT for prophylaxis: heparin Dispo: pending further medical management. SNF at discharge. Ortho follow up at discharge. Sleep study and  palliative care referral at discharge.    [name], MD INTERNAL MEDICINE RESIDENT PGY-1 PAGER #: [phone] 12/11/19  11:42 AM  Labs    CBC Latest Ref Rng & Units 12/10/2019 12/08/2019 12/07/2019  WBC 4.0 - 10.5 K/uL 8.2 9.6 8.0  Hemoglobin 13.0 - 17.0 g/dL 7.9(L) 8.3(L) 8.0(L)  Hematocrit 39.0 - 52.0 % 25.6(L) 26.7(L) 25.9(L)  Platelets 150 - 400 K/uL 171 193 181   BMP Latest Ref Rng & Units 12/11/2019 12/10/2019 12/08/2019  Glucose 70 - 99 mg/dL 108(H) 126(H) 119(H)  BUN 8 - 23 mg/dL 110(H) 103(H) 91(H)  Creatinine 0.61 - 1.24 mg/dL 7.82(H) 7.21(H) 6.67(H)  Sodium 135 - 145 mmol/L 141 145 142  Potassium 3.5 - 5.1 mmol/L 4.5 4.4 4.4  Chloride 98 - 111 mmol/L 110 112(H) 108  CO2 22 - 32 mmol/L 18(L) 22 18(L)  Calcium 8.9 - 10.3 mg/dL 7.7(L) 8.0(L) 8.2(L)

## 2019-12-11 NOTE — Progress Notes (Signed)
OT Cancellation Note  Patient Details Name: [name] MRN: [id number] DOB: Sep 19, 1955   Cancelled Treatment:    Reason Eval/Treat Not Completed: Patient at procedure or test/ unavailable.  Patient off floor at procedure.  Will check back tomorrow.  August Luz, OTR/L   Phylliss Bob 12/11/2019, 3:44 PM

## 2019-12-12 DIAGNOSIS — N186 End stage renal disease: Secondary | ICD-10-CM

## 2019-12-12 DIAGNOSIS — Z992 Dependence on renal dialysis: Secondary | ICD-10-CM

## 2019-12-12 DIAGNOSIS — I739 Peripheral vascular disease, unspecified: Secondary | ICD-10-CM | POA: Diagnosis present

## 2019-12-12 LAB — RENAL FUNCTION PANEL
Albumin: 2.4 g/dL — ABNORMAL LOW (ref 3.5–5.0)
Anion gap: 16 — ABNORMAL HIGH (ref 5–15)
BUN: 113 mg/dL — ABNORMAL HIGH (ref 8–23)
CO2: 17 mmol/L — ABNORMAL LOW (ref 22–32)
Calcium: 8 mg/dL — ABNORMAL LOW (ref 8.9–10.3)
Chloride: 109 mmol/L (ref 98–111)
Creatinine, Ser: 8.48 mg/dL — ABNORMAL HIGH (ref 0.61–1.24)
GFR calc Af Amer: 7 mL/min — ABNORMAL LOW (ref >60)
GFR calc non Af Amer: 6 mL/min — ABNORMAL LOW (ref >60)
Glucose, Bld: 121 mg/dL — ABNORMAL HIGH (ref 70–99)
Phosphorus: 8.6 mg/dL — ABNORMAL HIGH (ref 2.5–4.6)
Potassium: 4.8 mmol/L (ref 3.5–5.1)
Sodium: 142 mmol/L (ref 135–145)

## 2019-12-12 LAB — CBC
HCT: 25.5 % — ABNORMAL LOW (ref 39.0–52.0)
Hemoglobin: 7.8 g/dL — ABNORMAL LOW (ref 13.0–17.0)
MCH: 29.5 pg (ref 26.0–34.0)
MCHC: 30.6 g/dL (ref 30.0–36.0)
MCV: 96.6 fL (ref 80.0–100.0)
Platelets: 150 10*3/uL (ref 150–400)
RBC: 2.64 MIL/uL — ABNORMAL LOW (ref 4.22–5.81)
RDW: 17.4 % — ABNORMAL HIGH (ref 11.5–15.5)
WBC: 8.4 10*3/uL (ref 4.0–10.5)
nRBC: 0 % (ref 0.0–0.2)

## 2019-12-12 LAB — GLUCOSE, CAPILLARY
Glucose-Capillary: 112 mg/dL — ABNORMAL HIGH (ref 70–99)
Glucose-Capillary: 179 mg/dL — ABNORMAL HIGH (ref 70–99)
Glucose-Capillary: 130 mg/dL — ABNORMAL HIGH (ref 70–99)
Glucose-Capillary: 131 mg/dL — ABNORMAL HIGH (ref 70–99)

## 2019-12-12 MED ORDER — SODIUM CHLORIDE 0.9 % IV SOLN: INTRAVENOUS | Status: DC

## 2019-12-12 MED ORDER — SODIUM CHLORIDE 0.9% FLUSH
3.0000 mL | Freq: Two times a day (BID) | INTRAVENOUS | Status: DC
  Administered 2019-12-12 – 2019-12-16 (×8): 3 mL via INTRAVENOUS

## 2019-12-12 MED ORDER — SODIUM CHLORIDE 0.9% FLUSH: 3.0000 mL | INTRAVENOUS | Status: DC | PRN

## 2019-12-12 MED ORDER — SODIUM CHLORIDE 0.9 % IV SOLN: 250.0000 mL | INTRAVENOUS | Status: DC | PRN

## 2019-12-12 MED ORDER — ASPIRIN 81 MG PO CHEW
81.0000 mg | CHEWABLE_TABLET | ORAL | Status: AC
  Administered 2019-12-13: 81 mg via ORAL
  Filled 2019-12-12: qty 1

## 2019-12-12 NOTE — Progress Notes (Signed)
Occupational Therapy Treatment Patient Details Name: [name] MRN: [id number] DOB: 01/14/1956 Today's Date: 12/12/2019    History of present illness 64 year old man with PMH of left BKA and partial right foot amputation, no medical care for at least five years, who presented with cellulitis of the right foot and renal insufficiency. Also found to have bilateral pleural effusions Admitted 11/28/19 for treatment of cellulitis via IV antibiotics and determination of AKIvsCKD. WBAT on R LE   OT comments  Patient supine in bed on arrival.  He states he will not wear prosthesis as it does not fit anymore.  Patient demonstrated poor safety awareness insisting he is able to transfer without assist or walker.  He required min assist when not using walker.  Educated patient on importance of safety and fall prevention.  With RW patient performed much better and completed transfer with min guard.  He required mod assist with LB dressing.  Will continue to follow acutely with OT to improve safety with functional mobility and independence with ADLs.     Follow Up Recommendations  SNF;Supervision/Assistance - 24 hour    Equipment Recommendations  3 in 1 bedside commode    Recommendations for Other Services      Precautions / Restrictions Precautions Precautions: Fall Precaution Comments: Left BKA - LLE prosthesis (not fitting for past two sessions per pt, states he needs to be re-fitted) Restrictions Weight Bearing Restrictions: No       Mobility Bed Mobility Overal bed mobility: Needs Assistance Bed Mobility: Supine to Sit     Supine to sit: Supervision;HOB elevated        Transfers Overall transfer level: Needs assistance Equipment used: Rolling walker (2 wheeled);1 person hand held assist Transfers: Stand Pivot Transfers;Sit to/from Stand Sit to Stand: Min guard Stand pivot transfers: Min assist       General transfer comment: Much safer and smoother stand pivot with RW     Balance Overall balance assessment: Needs assistance Sitting-balance support: Feet supported Sitting balance-Leahy Scale: Fair     Standing balance support: Bilateral upper extremity supported;During functional activity Standing balance-Leahy Scale: Poor Standing balance comment: Reaches forfurniture to steady himself - unsafe reaching for rolling surfaces                           ADL either performed or assessed with clinical judgement   ADL Overall ADL's : Needs assistance/impaired                     Lower Body Dressing: Moderate assistance;Sitting/lateral leans   Toilet Transfer: Minimal assistance;Cueing for safety;BSC Toilet Transfer Details (indicate cue type and reason): Trialed with and without walker. Min guard with RW use, though likely he will not use                 Vision       Perception     Praxis      Cognition Arousal/Alertness: Awake/alert Behavior During Therapy: Impulsive Overall Cognitive Status: No family/caregiver present to determine baseline cognitive functioning Area of Impairment: Safety/judgement                         Safety/Judgement: Decreased awareness of safety;Decreased awareness of deficits     General Comments: Insistent he is able to transfer and be independent safely        Exercises     Shoulder Instructions  General Comments 2L O2  during session, SpO2 at 90 with movement, resting SpO2 mid 90s    Pertinent Vitals/ Pain       Pain Assessment: Faces Faces Pain Scale: Hurts little more Pain Location: L arm  Pain Descriptors / Indicators: Discomfort;Sore Pain Intervention(s): Limited activity within patient's tolerance;Monitored during session  Home Living                                          Prior Functioning/Environment              Frequency  Min 2X/week        Progress Toward Goals  OT Goals(current goals can now be found in the care  plan section)  Progress towards OT goals: Progressing toward goals  Acute Rehab OT Goals Patient Stated Goal: to get back to being independent Time For Goal Achievement: 12/15/19 Potential to Achieve Goals: Good  Plan Discharge plan remains appropriate    Co-evaluation                 AM-PAC OT "6 Clicks" Daily Activity     Outcome Measure   Help from another person eating meals?: None Help from another person taking care of personal grooming?: A Little Help from another person toileting, which includes using toliet, bedpan, or urinal?: A Little Help from another person bathing (including washing, rinsing, drying)?: A Little Help from another person to put on and taking off regular upper body clothing?: A Little Help from another person to put on and taking off regular lower body clothing?: A Lot 6 Click Score: 18    End of Session Equipment Utilized During Treatment: Gait belt;Rolling walker  OT Visit Diagnosis: Unsteadiness on feet (R26.81);Muscle weakness (generalized) (M62.81)   Activity Tolerance Patient tolerated treatment well   Patient Left in bed;with call bell/phone within reach;with bed alarm set   Nurse Communication Mobility status        Time: 8676-1950 OT Time Calculation (min): 18 min  Charges: OT General Charges $OT Visit: 1 Visit OT Treatments $Self Care/Home Management : 8-22 mins  August Luz, OTR/L    Phylliss Bob 12/12/2019, 11:40 AM

## 2019-12-12 NOTE — Progress Notes (Signed)
  Date: 12/12/2019  Patient name: [name]  Medical record number: [id number]  Date of birth: 1955-12-22        I have seen and evaluated this patient and I have discussed the plan of care with the house staff. Please see Dr. Chase Picket note for complete details. I concur with her findings and plan.    Sid Falcon, MD 12/12/2019, 8:51 PM

## 2019-12-12 NOTE — Progress Notes (Signed)
Progress Note  Patient Name: [name] Date of Encounter: 12/12/2019  Primary Cardiologist: No primary care provider on file.   Subjective   Received HD line and starting dialysis today.  Inpatient Medications    Scheduled Meds: . carvedilol  25 mg Oral BID WC  . Chlorhexidine Gluconate Cloth  6 each Topical Daily  . Chlorhexidine Gluconate Cloth  6 each Topical Q0600  . darbepoetin (ARANESP) injection - NON-DIALYSIS  100 mcg Subcutaneous Q Mon-1800  . furosemide  40 mg Intravenous Daily  . heparin  5,000 Units Subcutaneous Q8H  . hydrALAZINE  25 mg Oral Q8H  . hydrocerin   Topical BID  . insulin aspart  0-5 Units Subcutaneous QHS  . insulin aspart  0-9 Units Subcutaneous TID WC  . isosorbide mononitrate  15 mg Oral Daily  . multivitamin  1 tablet Oral QHS  . sevelamer carbonate  800 mg Oral TID WC  . sodium bicarbonate  650 mg Oral TID  . sodium chloride flush  3 mL Intravenous Once  . sodium chloride flush  3 mL Intravenous Q12H  . tamsulosin  0.4 mg Oral QHS   Continuous Infusions:  PRN Meds: HYDROmorphone, ondansetron (ZOFRAN) IV   Vital Signs    Vitals:   12/11/19 2134 12/12/19 0550 12/12/19 0823 12/12/19 1203  BP: 116/69 122/66 129/68 136/66  Pulse: 82 87 88 88  Resp: (!) 22 20 14  (!) 23  Temp: 98.4 F (36.9 C) 98.2 F (36.8 C) 99.1 F (37.3 C) 97.9 F (36.6 C)  TempSrc: Oral Oral Oral Oral  SpO2: 93% 94% (!) 87% 90%  Weight:  72.8 kg    Height:        Intake/Output Summary (Last 24 hours) at 12/12/2019 1347 Last data filed at 12/12/2019 0930 Gross per 24 hour  Intake 180 ml  Output 360 ml  Net -180 ml   Last 3 Weights 12/12/2019 12/11/2019 12/10/2019  Weight (lbs) 160 lb 7.9 oz 157 lb 13.6 oz 157 lb 10.1 oz  Weight (kg) 72.8 kg 71.6 kg 71.5 kg      Telemetry    SR - Personally Reviewed  ECG    No new - Personally Reviewed  Physical Exam   GEN: No acute distress.   Neck: No JVD Cardiac: RRR, soft systolic murmur, no rubs, or  gallops.  Respiratory: Clear to auscultation bilaterally. GI: Soft, nontender, non-distended  MS: Mild edema; RLE partial foot amputation, LLE AKA Neuro:  Nonfocal  Psych: Normal affect   Labs    High Sensitivity Troponin:  No results for input(s): TROPONINIHS in the last 720 hours.    Chemistry Recent Labs  Lab 12/10/19 0352 12/11/19 0407 12/12/19 0508  NA 145 141 142  K 4.4 4.5 4.8  CL 112* 110 109  CO2 22 18* 17*  GLUCOSE 126* 108* 121*  BUN 103* 110* 113*  CREATININE 7.21* 7.82* 8.48*  CALCIUM 8.0* 7.7* 8.0*  ALBUMIN 2.3* 2.4* 2.4*  GFRNONAA 7* 7* 6*  GFRAA 9* 8* 7*  ANIONGAP 11 13 16*     Hematology Recent Labs  Lab 12/08/19 0546 12/10/19 0352 12/12/19 0508  WBC 9.6 8.2 8.4  RBC 2.81* 2.67* 2.64*  HGB 8.3* 7.9* 7.8*  HCT 26.7* 25.6* 25.5*  MCV 95.0 95.9 96.6  MCH 29.5 29.6 29.5  MCHC 31.1 30.9 30.6  RDW 17.6* 17.5* 17.4*  PLT 193 171 150    BNPNo results for input(s): BNP, PROBNP in the last 168 hours.   DDimer No  results for input(s): DDIMER in the last 168 hours.   Radiology    DG Chest Port 1 View  Result Date: 12/11/2019 CLINICAL DATA:  Status post dialysis catheter insertion. EXAM: PORTABLE CHEST 1 VIEW COMPARISON:  November 28, 2019 FINDINGS: A right internal jugular venous catheter is seen with its distal tip noted at the level of the diaphragm. This represents a new finding when compared to the prior study. Mild to moderate severity atelectasis and/or infiltrate is seen within the bilateral lung bases. Small, stable bilateral pleural effusions are seen. No pneumothorax is identified. The heart size and mediastinal contours are within normal limits. Multilevel degenerative changes seen throughout the thoracic spine. Radiopaque surgical clips are seen overlying the right upper quadrant. IMPRESSION: 1. Right internal jugular venous catheter with its distal tip at the level of the diaphragm. 2. Mild to moderate severity bibasilar atelectasis and/or  infiltrate. 3. Small, stable bilateral pleural effusions. Electronically Signed   By: Virgina Norfolk M.D.   On: 12/11/2019 17:59   DG Fluoro Guide CV Line-No Report  Result Date: 12/11/2019 Fluoroscopy was utilized by the requesting physician.  No radiographic interpretation.    Cardiac Studies   1. Large left pleural effusion with atelectatic lung tissue seen.  2. Diffuse hypokinesis worse in the inferior base . Left ventricular  ejection fraction, by estimation, is 30 to 35%. The left ventricle has  moderately decreased function. The left ventricle demonstrates global  hypokinesis. The left ventricular internal  cavity size was mildly dilated. There is moderate left ventricular  hypertrophy. Left ventricular diastolic parameters are consistent with  Grade II diastolic dysfunction (pseudonormalization). Elevated left  ventricular end-diastolic pressure.  3. Right ventricular systolic function is moderately reduced. The right  ventricular size is mildly enlarged.  4. The mitral valve is normal in structure. Mild mitral valve  regurgitation. No evidence of mitral stenosis.  5. The aortic valve is tricuspid. Aortic valve regurgitation is not  visualized. Mild aortic valve sclerosis is present, with no evidence of  aortic valve stenosis.  6. The inferior vena cava is dilated in size with <50% respiratory  variability, suggesting right atrial pressure of 15 mmHg.   Patient Profile    64 y.o. 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   Assessment & Plan    # acute systolic heart failure Diuretics per nephrology HD planned with line placement yesterday Patient will need coronary angiography after HD has been established.  The patient and I discussed today that this could occur on Friday if  dialysis is successfully established.  He is on the schedule for [facility] tomorrow, consented as noted below.  Medical therapy with Coreg 25 mg twice daily, hydralazine 25 mg 3 times daily, Imdur 50 mg daily.  INFORMED CONSENT: I have reviewed the risks, indications, and alternatives to cardiac catheterization, possible angioplasty, and stenting with the patient. Risks include but are not limited to bleeding, infection, vascular injury, stroke, myocardial infection, arrhythmia, kidney injury, radiation-related injury in the case of prolonged fluoroscopy use, emergency cardiac surgery, and death. The patient understands the risks of serious complication is 1-2 in 8182 with diagnostic cardiac cath and 1-2% or less with angioplasty/stenting.       For questions or updates, please contact [place] Please consult [email or web address] for contact info under  Signed, Elouise Munroe, MD  12/12/2019, 1:47 PM

## 2019-12-12 NOTE — H&P (View-Only) (Signed)
Progress Note  Patient Name: [name] Date of Encounter: 12/12/2019  Primary Cardiologist: No primary care provider on file.   Subjective   Received HD line and starting dialysis today.  Inpatient Medications    Scheduled Meds: . carvedilol  25 mg Oral BID WC  . Chlorhexidine Gluconate Cloth  6 each Topical Daily  . Chlorhexidine Gluconate Cloth  6 each Topical Q0600  . darbepoetin (ARANESP) injection - NON-DIALYSIS  100 mcg Subcutaneous Q Mon-1800  . furosemide  40 mg Intravenous Daily  . heparin  5,000 Units Subcutaneous Q8H  . hydrALAZINE  25 mg Oral Q8H  . hydrocerin   Topical BID  . insulin aspart  0-5 Units Subcutaneous QHS  . insulin aspart  0-9 Units Subcutaneous TID WC  . isosorbide mononitrate  15 mg Oral Daily  . multivitamin  1 tablet Oral QHS  . sevelamer carbonate  800 mg Oral TID WC  . sodium bicarbonate  650 mg Oral TID  . sodium chloride flush  3 mL Intravenous Once  . sodium chloride flush  3 mL Intravenous Q12H  . tamsulosin  0.4 mg Oral QHS   Continuous Infusions:  PRN Meds: HYDROmorphone, ondansetron (ZOFRAN) IV   Vital Signs    Vitals:   12/11/19 2134 12/12/19 0550 12/12/19 0823 12/12/19 1203  BP: 116/69 122/66 129/68 136/66  Pulse: 82 87 88 88  Resp: (!) 22 20 14  (!) 23  Temp: 98.4 F (36.9 C) 98.2 F (36.8 C) 99.1 F (37.3 C) 97.9 F (36.6 C)  TempSrc: Oral Oral Oral Oral  SpO2: 93% 94% (!) 87% 90%  Weight:  72.8 kg    Height:        Intake/Output Summary (Last 24 hours) at 12/12/2019 1347 Last data filed at 12/12/2019 0930 Gross per 24 hour  Intake 180 ml  Output 360 ml  Net -180 ml   Last 3 Weights 12/12/2019 12/11/2019 12/10/2019  Weight (lbs) 160 lb 7.9 oz 157 lb 13.6 oz 157 lb 10.1 oz  Weight (kg) 72.8 kg 71.6 kg 71.5 kg      Telemetry    SR - Personally Reviewed  ECG    No new - Personally Reviewed  Physical Exam   GEN: No acute distress.   Neck: No JVD Cardiac: RRR, soft systolic murmur, no rubs, or  gallops.  Respiratory: Clear to auscultation bilaterally. GI: Soft, nontender, non-distended  MS: Mild edema; RLE partial foot amputation, LLE AKA Neuro:  Nonfocal  Psych: Normal affect   Labs    High Sensitivity Troponin:  No results for input(s): TROPONINIHS in the last 720 hours.    Chemistry Recent Labs  Lab 12/10/19 0352 12/11/19 0407 12/12/19 0508  NA 145 141 142  K 4.4 4.5 4.8  CL 112* 110 109  CO2 22 18* 17*  GLUCOSE 126* 108* 121*  BUN 103* 110* 113*  CREATININE 7.21* 7.82* 8.48*  CALCIUM 8.0* 7.7* 8.0*  ALBUMIN 2.3* 2.4* 2.4*  GFRNONAA 7* 7* 6*  GFRAA 9* 8* 7*  ANIONGAP 11 13 16*     Hematology Recent Labs  Lab 12/08/19 0546 12/10/19 0352 12/12/19 0508  WBC 9.6 8.2 8.4  RBC 2.81* 2.67* 2.64*  HGB 8.3* 7.9* 7.8*  HCT 26.7* 25.6* 25.5*  MCV 95.0 95.9 96.6  MCH 29.5 29.6 29.5  MCHC 31.1 30.9 30.6  RDW 17.6* 17.5* 17.4*  PLT 193 171 150    BNPNo results for input(s): BNP, PROBNP in the last 168 hours.   DDimer No  results for input(s): DDIMER in the last 168 hours.   Radiology    DG Chest Port 1 View  Result Date: 12/11/2019 CLINICAL DATA:  Status post dialysis catheter insertion. EXAM: PORTABLE CHEST 1 VIEW COMPARISON:  November 28, 2019 FINDINGS: A right internal jugular venous catheter is seen with its distal tip noted at the level of the diaphragm. This represents a new finding when compared to the prior study. Mild to moderate severity atelectasis and/or infiltrate is seen within the bilateral lung bases. Small, stable bilateral pleural effusions are seen. No pneumothorax is identified. The heart size and mediastinal contours are within normal limits. Multilevel degenerative changes seen throughout the thoracic spine. Radiopaque surgical clips are seen overlying the right upper quadrant. IMPRESSION: 1. Right internal jugular venous catheter with its distal tip at the level of the diaphragm. 2. Mild to moderate severity bibasilar atelectasis and/or  infiltrate. 3. Small, stable bilateral pleural effusions. Electronically Signed   By: Virgina Norfolk M.D.   On: 12/11/2019 17:59   DG Fluoro Guide CV Line-No Report  Result Date: 12/11/2019 Fluoroscopy was utilized by the requesting physician.  No radiographic interpretation.    Cardiac Studies   1. Large left pleural effusion with atelectatic lung tissue seen.  2. Diffuse hypokinesis worse in the inferior base . Left ventricular  ejection fraction, by estimation, is 30 to 35%. The left ventricle has  moderately decreased function. The left ventricle demonstrates global  hypokinesis. The left ventricular internal  cavity size was mildly dilated. There is moderate left ventricular  hypertrophy. Left ventricular diastolic parameters are consistent with  Grade II diastolic dysfunction (pseudonormalization). Elevated left  ventricular end-diastolic pressure.  3. Right ventricular systolic function is moderately reduced. The right  ventricular size is mildly enlarged.  4. The mitral valve is normal in structure. Mild mitral valve  regurgitation. No evidence of mitral stenosis.  5. The aortic valve is tricuspid. Aortic valve regurgitation is not  visualized. Mild aortic valve sclerosis is present, with no evidence of  aortic valve stenosis.  6. The inferior vena cava is dilated in size with <50% respiratory  variability, suggesting right atrial pressure of 15 mmHg.   Patient Profile    64 y.o. 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   Assessment & Plan    # acute systolic heart failure Diuretics per nephrology HD planned with line placement yesterday Patient will need coronary angiography after HD has been established.  The patient and I discussed today that this could occur on Friday if  dialysis is successfully established.  He is on the schedule for [facility] tomorrow, consented as noted below.  Medical therapy with Coreg 25 mg twice daily, hydralazine 25 mg 3 times daily, Imdur 50 mg daily.  INFORMED CONSENT: I have reviewed the risks, indications, and alternatives to cardiac catheterization, possible angioplasty, and stenting with the patient. Risks include but are not limited to bleeding, infection, vascular injury, stroke, myocardial infection, arrhythmia, kidney injury, radiation-related injury in the case of prolonged fluoroscopy use, emergency cardiac surgery, and death. The patient understands the risks of serious complication is 1-2 in 8280 with diagnostic cardiac cath and 1-2% or less with angioplasty/stenting.       For questions or updates, please contact [place] Please consult [email or web address] for contact info under  Signed, Elouise Munroe, MD  12/12/2019, 1:47 PM

## 2019-12-12 NOTE — Progress Notes (Signed)
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 Also found to have bilateral pleural effusions Admitted 11/28/19 for treatment of cellulitis via IV antibiotics and determination of AKIvsCKD. WBAT on R LE    PT Comments    Pt sitting EOB upon arrival to room, agreeable to LE exercises for general strengthening and maintenance of LLE ROM for prosthesis use. Pt tolerated exercises well, not agreeable to transfers as pt just did so with OT prior to PT arrival. PT updated recommendation to reflect SNF post-acutely given safety awareness issues, mobility deficits given prosthesis issues, and decreased activity tolerance. Pt feels he will be safe to manage at home given current functional status, PT does not feel this is safe at all. If pt refuses, PT recommends maxing out [name] services to provide pt with most care possible in home setting. PT to continue to follow acutely.    Follow Up Recommendations  SNF;Supervision/Assistance - 24 hour(vs SNF given safety issues as well as limited mobility while acute)     Equipment Recommendations  None recommended by PT;Other (comment)    Recommendations for Other Services       Precautions / Restrictions Precautions Precautions: Fall Precaution Comments: Left BKA - LLE prosthesis (not fitting for past two sessions per pt, states he needs to be re-fitted) Other Brace: R post-op shoe Restrictions Weight Bearing Restrictions: No Other Position/Activity Restrictions: WBAT RLE    Mobility  Bed Mobility Overal bed mobility: Needs Assistance Bed Mobility: Supine to Sit     Supine to sit: Supervision;HOB elevated     General bed mobility comments: pt sitting EOB upon PT  arrival, requests stay in seated position upon PT exit.  Transfers Overall transfer level: Needs assistance Equipment used: Rolling walker (2 wheeled);1 person hand held assist Transfers: Stand Pivot Transfers;Sit to/from Stand Sit to Stand: Min guard Stand pivot transfers: Min assist       General transfer comment: pt declines transfer training as therapeutic exercise, states he just transferred with OT.  Ambulation/Gait                 Stairs             Wheelchair Mobility    Modified Rankin (Stroke Patients Only)       Balance Overall balance assessment: Needs assistance Sitting-balance support: Feet supported Sitting balance-Leahy Scale: Fair Sitting balance - Comments: able to perform dynamic tasks without LOB, min posterior leaning noted Postural control: Posterior lean Standing balance support: Bilateral upper extremity supported;During functional activity Standing balance-Leahy Scale: Poor Standing balance comment: Reaches forfurniture to steady himself - unsafe reaching for rolling surfaces                            Cognition Arousal/Alertness: Awake/alert Behavior During Therapy: Impulsive Overall Cognitive Status: No family/caregiver present to determine baseline cognitive functioning Area of Impairment: Safety/judgement                         Safety/Judgement: Decreased awareness of safety;Decreased awareness of deficits     General Comments: pt feels he will be safe to manage at home given current functional status, PT does not  feel this is safe.      Exercises Amputee Exercises Hip Extension: AROM;Both;20 reps;Seated Hip ABduction/ADduction: AROM;Both;20 reps;Seated Hip Flexion/Marching: AROM;Both;20 reps;Seated Knee Extension: AROM;Both;20 reps;Seated    General Comments General comments (skin integrity, edema, etc.): 2L O2 [name] during session, SpO2 at 90 with movement, resting SpO2 mid 90s      Pertinent  Vitals/Pain Pain Assessment: No/denies pain Faces Pain Scale: Hurts little more Pain Location: L arm  Pain Descriptors / Indicators: Discomfort;Sore Pain Intervention(s): Limited activity within patient's tolerance;Monitored during session    Home Living                      Prior Function            PT Goals (current goals can now be found in the care plan section) Acute Rehab PT Goals Patient Stated Goal: to get back to being independent PT Goal Formulation: With patient Time For Goal Achievement: 12/14/19 Potential to Achieve Goals: Fair Progress towards PT goals: Not progressing toward goals - comment(prosthesis not fitting, requires physiacl assist for mobility)    Frequency    Min 3X/week      PT Plan Discharge plan needs to be updated    Co-evaluation              AM-PAC PT "6 Clicks" Mobility   Outcome Measure  Help needed turning from your back to your side while in a flat bed without using bedrails?: A Little Help needed moving from lying on your back to sitting on the side of a flat bed without using bedrails?: A Little Help needed moving to and from a bed to a chair (including a wheelchair)?: A Little Help needed standing up from a chair using your arms (e.g., wheelchair or bedside chair)?: A Little Help needed to walk in [name] room?: A Lot Help needed climbing 3-5 steps with a railing? : A Lot 6 Click Score: 16    End of Session Equipment Utilized During Treatment: Oxygen;Other (comment)(post-op shoe) Activity Tolerance: Patient tolerated treatment well Patient left: in bed;with call bell/phone within reach Nurse Communication: Mobility status PT Visit Diagnosis: Unsteadiness on feet (R26.81);Other abnormalities of gait and mobility (R26.89);Muscle weakness (generalized) (M62.81);Difficulty in walking, not elsewhere classified (R26.2)     Time: 1050-1105 PT Time Calculation (min) (ACUTE ONLY): 15 min  Charges:  $Therapeutic  Exercise: 8-22 mins                     [name] E, PT Acute Rehabilitation Services Pager [phone]  Office [phone]    [name] D [name] 12/12/2019, 12:01 PM

## 2019-12-12 NOTE — TOC Initial Note (Signed)
Transition of Care [facility]) - Initial/Assessment Note    Patient Details  Name: [name] MRN: [id number] Date of Birth: [birth year]  [facility] CM/SW Contact:    [name], LCSW Phone Number: 12/12/2019, 9:36 AM  Clinical Narrative:                 CSW received consult for possible SNF placement at time of discharge. CSW spoke with patient regarding PT recommendation of SNF placement at time of discharge. Patient reported that patient's siblings are currently unable to care for patient at their home given patient's current physical needs and fall risk. CSW discussed insurance authorization process and provided Medicare SNF ratings list. Patient expressed being hopeful for rehab and to feel better soon. No further questions reported at this time. CSW to continue to follow and assist with discharge planning needs once patient's outpatient Dialysis schedule is finalized. Of note, [facility] can accept patient if dialysis chair time is between 9am-12pm. [facility] can only accept if chair time is MWF after 8am.    Expected Discharge Plan: Skilled Nursing Facility Barriers to Discharge: Continued Medical Work up   Patient Goals and CMS Choice Patient states their goals for this hospitalization and ongoing recovery are:: rehab CMS Medicare.gov Compare Post Acute Care list provided to:: Patient Choice offered to / list presented to : Patient  Expected Discharge Plan and Services Expected Discharge Plan: [place] In-house Referral: Clinical Social Work   Post Acute Care Choice: [place] Living arrangements for the past 2 months: [facility]                                      Prior Living Arrangements/Services Living arrangements for the past 2 months: Single Family Home Lives with:: Siblings Patient language and need for interpreter reviewed:: Yes Do you feel safe going back to the place where you live?: Yes       Need for Family Participation in Patient Care: No (Comment) Care giver support system in place?: Yes (comment)   Criminal Activity/Legal Involvement Pertinent to Current Situation/Hospitalization: No - Comment as needed  Activities of Daily Living Home Assistive Devices/Equipment: Wheelchair, Environmental consultant (specify type) ADL Screening (condition at time of admission) Patient's cognitive ability adequate to safely complete daily activities?: Yes Is the patient deaf or have difficulty hearing?: No Does the patient have difficulty seeing, even when wearing glasses/contacts?: No Does the patient have difficulty concentrating, remembering, or making decisions?: No Patient able to express need for assistance with ADLs?: Yes Does the patient have difficulty dressing or bathing?: No Independently performs ADLs?: Yes (appropriate for developmental age) Does the patient have difficulty walking or climbing stairs?: Yes Weakness of Legs: Right(LEFT BKA  RIGHT PARTIAL FOOT/AMPUTATION) Weakness of Arms/Hands: None  Permission Sought/Granted Permission sought to share information with : Facility Sport and exercise psychologist, Family Supports Permission granted to share information with : Yes, Verbal Permission Granted     Permission granted to share info w AGENCY: SNFs        Emotional Assessment Appearance:: Appears stated age Attitude/Demeanor/Rapport: Engaged Affect (typically observed): Accepting, Appropriate Orientation: : Oriented to Self, Oriented to Place, Oriented to  Time, Oriented to Situation Alcohol / Substance Use: Not Applicable Psych Involvement: No (comment)  Admission diagnosis:  Shortness of breath [R06.02] Renal failure [N19] Pain [R52] Cellulitis of right lower extremity [L03.115] Acute renal failure superimposed on chronic kidney  disease, unspecified CKD stage, unspecified acute renal failure type ([place]) [N17.9, N18.9] Patient Active Problem List   Diagnosis Date Noted  . ESRD on  dialysis ([place]) 12/12/2019  . PAD (peripheral artery disease) ([place]) 12/12/2019  . Pressure injury of skin 12/01/2019  . Anemia 11/29/2019  . Uncontrolled hypertension 11/29/2019  . CKD (chronic kidney disease), stage V ([place]) 11/29/2019  . Severe protein-calorie malnutrition ([place])   . Diabetic polyneuropathy associated with type 2 diabetes mellitus ([place])   . Venous insufficiency of right lower extremity   . Renal failure 11/28/2019  . Toe gangrene ([place]) 12/18/2013  . Gangrene of toe ([place]) 09/16/2013  . Fever with chills 09/16/2013  . Unilateral complete BKA ([place]) 09/13/2013  . DKA (diabetic ketoacidoses) ([place]) 09/04/2013  . Type 2 diabetes mellitus with diabetic foot infection ([place]) 09/02/2013  . DM (diabetes mellitus) type II controlled, neurological manifestation ([place]) 09/01/2013   PCP:  Patient, No Pcp Per Pharmacy:   [facility] ([place]), [place] - [address] [place] ([place]  [id number] Phone: [phone] Fax: [phone]     Social Determinants of Health (SDOH) Interventions    Readmission Risk Interventions No flowsheet data found.

## 2019-12-12 NOTE — Progress Notes (Signed)
NAMEErhard [name], MRN:  [id number], DOB:  03-Mar-1956, LOS: 80 ADMISSION DATE:  11/28/2019   Brief History  64 yo male with T2DM, HTN, left AKA and right partial foot amputation who was admitted to IMTS on 4/29 for purulent cellulitis of RLE and found to have severe combined heart failure and CKD 5.  Subjective/Interm history  No overnight events. Patient is upset about the frequency of bandage changes but otherwise denies any pain at this time. He tolerated the catheter placement without complications.   Significant Hospital Events   4/29 hospital admission 4/30 ortho and neph consulted. Sodium bicarb started 5/1 40mg  lasix>1.6L out. Lopressor started. 5/2 transfused 1U PRBC for hgb of 7. Vanc/rocephin/flagyl stopped>transitioned to doxy. flomax started for urinary retention.  5/3 hgb 8.5. renal function continuing to decline 5/4 cr continuing to rise, cardiology consulted--hydral/imdur started, metop>coreg 5/5 no significant changes. Foley removed however was retaining so foley replaced.  5/6 creatinine plateauing. vasc consulted for permanent access. 5/7 continued renal decline. Creatinine now 6.85. lexiscan, vein mapping. Planning to obtain access for HD on Monday 5/8 no significant events 5/9 no significant changes 5/10 HD access placement pushed back to Wednesday 5/11 creatinine up to 7.2. 5/12 AVF/TDC placed 5/13 going for HD today.  Objective   Blood pressure 116/69, pulse 82, temperature 98.4 F (36.9 C), temperature source Oral, resp. rate (!) 22, height 5\' 3"  (1.6 m), weight 71.6 kg, SpO2 93 %.     Intake/Output Summary (Last 24 hours) at 12/12/2019 0538 Last data filed at 12/11/2019 1428 Gross per 24 hour  Intake 100 ml  Output 975 ml  Net -875 ml   Filed Weights   12/08/19 0554 12/10/19 0640 12/11/19 0552  Weight: 71.6 kg 71.5 kg 71.6 kg    Examination: GENERAL: in no acute distress CARDIAC: RLE edema continues to show significant improvement PULMONARY:  breathing comfortably on room air SKIN: UNA boot to RLE GU: foley  Consults:  Orthopedics-signed off Nephology  Cardiology Vascular surgery Significant Diagnostic Tests:  4/29 R foot MRI>>no evidence of loculated fluid collection or osteomyelitis  4/29 CXR>>b/l effusions with basilar atelectasis vs pna; vascular congestion with diffuse interstitial opacities suspicious for pulm edema  4/30 CT renal study>>no acute abnormalities in abd/pelvis.  4/30 echocardiogram>>Left ventricular ejection fraction, by estimation, is 30 to 35%. The left ventricle has moderately decreased function. The left ventricle demonstrates global hypokinesis. Diffuse hypokinesis worse in the inferior base .  The left ventricular internal cavity size was mildly dilated. There is moderate left ventricular hypertrophy. Left ventricular diastolic parameters are consistent with Grade II diastolic dysfunction (pseudonormalization). Elevated left ventricular end-diastolic pressure. Right ventricular systolic function is moderately reduced. The right ventricular size is mildly enlarged. The mitral valve is normal in structure. Mild mitral valve egurgitation. No evidence of mitral stenosis. The aortic valve is tricuspid. Aortic valve regurgitation is not visualized. Mild aortic valve sclerosis is present, with no evidence of aortic valve stenosis. The inferior vena cava is dilated in size with <50% respiratory variability, suggesting right atrial pressure of 15 mmHg.   5/5 cardiac amyloid scan>not suggestive of amyloid 5/7 lexiscan>large severe apical and inferolateral perfusion defect which is reversible suggestive of possible LCx territory ischemia. LVEF 31% with severe global hypokinesia and inferolateral hypokinesis to akinesis.  Micro Data:  4/29 blood cultures>>NG 5/2 urine cultures>>NG  Antimicrobials:  Vancomycin 4/29>>5/2 Rocephin 4/29>>5/2 Flagyl 4/29>>5/2 Doxycycline 5/2>>5/8  Summary  64 yo male with T2DM and  HTN who was admitted to IMTS on  4/29 for purulent cellulitis of RLE and found to have heart and renal failure. Remaining hospitalized while arranging preparations for HD--access placement planned for 5/12 after which cardiology will do a heart cath.  Resolved/Stable hospital problems  Bacturia-received 3d course of vanc/rocephin/flagyl. No growth on urine culture RLE cellulitis. Completed 10d course of abx treatment  Acute hypoxic respiratory failure  Due to volume overload  Assessment & Plan:  Principal Problem:   Type 2 diabetes mellitus with diabetic foot infection ([name]) Active Problems:   Renal failure   Anemia   Uncontrolled hypertension   CKD (chronic kidney disease), stage V (HCC)   Shortness of breath   Pressure injury of skin   Hypoxia   ESRD. Likely due to poorly controlled DM and HTN along with CHF.  POD #1 AVF/TDC  Urinary retention. Failed voiding trial on 5/5 so foley had to be replaced. Plan: nephrology managing.  --Planning for HD today. Renal navigator assisting in setting up outpatient HD.  --Continue bicarb and renvela.  --Strict I/Os.  --Continue foley catheter and flomax for urinary retention. Can consider another voiding trial once done with procedures.  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 EF 30-35% with global LV hypokinesis and G2DD. lexiscan showing severe inferolateral and apical defects. Cardiology planning for heart cath once dialysis is initiated. Hypertension. Blood pressures stable PAD. As indicated on 5/4 ABI Plan: cardiology managing. Blood pressures stable. On hydral, imdur, coreg and lasix. Continue tele monitoring, strict I/Os, daily weights.   T2DM. Blood sugars are remaining stable on SSI.  Best practice:  CODE STATUS: full Diet: cardiac, renal DVT for prophylaxis: heparin Dispo: pending further medical management. HH at discharge. Sleep study and palliative care referral at discharge.    Ian Hansen, MD INTERNAL MEDICINE RESIDENT PGY-1 PAGER #: [phone] 12/12/19  5:38 AM  Labs    CBC Latest Ref Rng & Units 12/10/2019 12/08/2019 12/07/2019  WBC 4.0 - 10.5 K/uL 8.2 9.6 8.0  Hemoglobin 13.0 - 17.0 g/dL 7.9(L) 8.3(L) 8.0(L)  Hematocrit 39.0 - 52.0 % 25.6(L) 26.7(L) 25.9(L)  Platelets 150 - 400 K/uL 171 193 181   BMP Latest Ref Rng & Units 12/11/2019 12/10/2019 12/08/2019  Glucose 70 - 99 mg/dL 108(H) 126(H) 119(H)  BUN 8 - 23 mg/dL 110(H) 103(H) 91(H)  Creatinine 0.61 - 1.24 mg/dL 7.82(H) 7.21(H) 6.67(H)  Sodium 135 - 145 mmol/L 141 145 142  Potassium 3.5 - 5.1 mmol/L 4.5 4.4 4.4  Chloride 98 - 111 mmol/L 110 112(H) 108  CO2 22 - 32 mmol/L 18(L) 22 18(L)  Calcium 8.9 - 10.3 mg/dL 7.7(L) 8.0(L) 8.2(L)

## 2019-12-12 NOTE — Progress Notes (Signed)
  Progress Note    12/12/2019 7:23 AM 1 Day Post-Op  Subjective:  Denies signs or symptoms of steal L hand   Vitals:   12/11/19 2134 12/12/19 0550  BP: 116/69 122/66  Pulse: 82 87  Resp: (!) 22 20  Temp: 98.4 F (36.9 C) 98.2 F (36.8 C)  SpO2: 93% 94%   Physical Exam: Lungs:  Non labored Incisions:  L ac fossa incision c/d/i without hematoma Extremities:  L hand warm with grip strength intact; pulsatile flow through fistula, palpable to the level of the upper arm Neurologic: A&O  CBC    Component Value Date/Time   WBC 8.4 12/12/2019 0508   RBC 2.64 (L) 12/12/2019 0508   HGB 7.8 (L) 12/12/2019 0508   HCT 25.5 (L) 12/12/2019 0508   HCT 25.5 (L) 11/29/2019 1103   PLT 150 12/12/2019 0508   MCV 96.6 12/12/2019 0508   MCH 29.5 12/12/2019 0508   MCHC 30.6 12/12/2019 0508   RDW 17.4 (H) 12/12/2019 0508   LYMPHSABS 0.7 11/28/2019 1330   MONOABS 0.5 11/28/2019 1330   EOSABS 0.5 11/28/2019 1330   BASOSABS 0.0 11/28/2019 1330    BMET    Component Value Date/Time   NA 142 12/12/2019 0508   K 4.8 12/12/2019 0508   CL 109 12/12/2019 0508   CO2 17 (L) 12/12/2019 0508   GLUCOSE 121 (H) 12/12/2019 0508   BUN 113 (H) 12/12/2019 0508   CREATININE 8.48 (H) 12/12/2019 0508   CALCIUM 8.0 (L) 12/12/2019 0508   GFRNONAA 6 (L) 12/12/2019 0508   GFRAA 7 (L) 12/12/2019 0508    INR    Component Value Date/Time   INR 1.0 12/06/2019 1315     Intake/Output Summary (Last 24 hours) at 12/12/2019 0723 Last data filed at 12/11/2019 1428 Gross per 24 hour  Intake 100 ml  Output 525 ml  Net -425 ml     Assessment/Plan:  64 y.o. male is s/p R IJ TDC and L brachiocephalic fistula creation 1 Day Post-Op   Patent fistula without signs or symptoms of steal R IJ TDC site unremarkable Check fistula duplex in 4-6 weeks Ok for discharge from vascular standpoint   Dagoberto Ligas, PA-C [facility] [phone] 12/12/2019 7:23 AM

## 2019-12-12 NOTE — Progress Notes (Addendum)
[facility] DAILY PROGRESS NOTE  S: Patient seen this morning resting comfortably in bed, sleeping soundly, easily awakened. NAD-  Taking things day by day  O:BP 129/68 (BP Location: Left Arm)   Pulse 88   Temp 99.1 F (37.3 C) (Oral)   Resp 14   Ht 5\' 3"  (1.6 m)   Wt 72.8 kg   SpO2 (!) 87%   BMI 28.43 kg/m   Intake/Output Summary (Last 24 hours) at 12/12/2019 0917 Last data filed at 12/12/2019 0700 Gross per 24 hour  Intake 100 ml  Output 850 ml  Net -750 ml   Intake/Output: I/O last 3 completed shifts: In: 100 [IV Piggyback:100] Out: 1300 [Urine:1280; Blood:20]  Intake/Output this shift:  No intake/output data recorded. Weight change: 1.2 kg  PHYSICAL EXAM Gen: Appears older than stated age, nontoxic appearing CVS: RRR, S1-S2 present, no murmurs Resp: CTA bilaterally, normal work of breathing Abd: Soft, nontender, normal bowel sounds appreciated Ext: Continued 2+ edema to right lower extremity, left BKA New left upper arm AVF with thrill and bruit.Marland Kitchen new right sided [facility]   Recent Labs  Lab 12/06/19 1315 12/07/19 0522 12/08/19 0546 12/10/19 0352 12/11/19 0407 12/12/19 0508  NA 140 142 142 145 141 142  K 4.1 4.2 4.4 4.4 4.5 4.8  CL 109 109 108 112* 110 109  CO2 18* 17* 18* 22 18* 17*  GLUCOSE 136* 113* 119* 126* 108* 121*  BUN 84* 86* 91* 103* 110* 113*  CREATININE 6.85* 6.72* 6.67* 7.21* 7.82* 8.48*  ALBUMIN 2.5* 2.3* 2.3* 2.3* 2.4* 2.4*  CALCIUM 7.9* 7.9* 8.2* 8.0* 7.7* 8.0*  PHOS 6.6* 7.0* 7.0* 8.1* 7.9* 8.6*   Liver Function Tests: Recent Labs  Lab 12/10/19 0352 12/11/19 0407 12/12/19 0508  ALBUMIN 2.3* 2.4* 2.4*   No results for input(s): LIPASE, AMYLASE in the last 168 hours. No results for input(s): AMMONIA in the last 168 hours. CBC: Recent Labs  Lab 12/06/19 1315 12/06/19 1315 12/07/19 0522 12/07/19 0522 12/08/19 0546 12/10/19 0352 12/12/19 0508  WBC 9.5   < > 8.0   < > 9.6 8.2 8.4  HGB 9.0*   < > 8.0*   < > 8.3* 7.9*  7.8*  HCT 29.4*   < > 25.9*   < > 26.7* 25.6* 25.5*  MCV 94.8  --  95.2  --  95.0 95.9 96.6  PLT 212   < > 181   < > 193 171 150   < > = values in this interval not displayed.   Cardiac Enzymes: No results for input(s): CKTOTAL, CKMB, CKMBINDEX, TROPONINI in the last 168 hours. CBG: Recent Labs  Lab 12/11/19 1158 12/11/19 1453 12/11/19 1607 12/11/19 2302 12/12/19 0826  GLUCAP 102* 107* 98 149* 112*    Iron Studies: No results for input(s): IRON, TIBC, TRANSFERRIN, FERRITIN in the last 72 hours. Studies/Results: DG Chest Port 1 View  Result Date: 12/11/2019 CLINICAL DATA:  Status post dialysis catheter insertion. EXAM: PORTABLE CHEST 1 VIEW COMPARISON:  November 28, 2019 FINDINGS: A right internal jugular venous catheter is seen with its distal tip noted at the level of the diaphragm. This represents a new finding when compared to the prior study. Mild to moderate severity atelectasis and/or infiltrate is seen within the bilateral lung bases. Small, stable bilateral pleural effusions are seen. No pneumothorax is identified. The heart size and mediastinal contours are within normal limits. Multilevel degenerative changes seen throughout the thoracic spine. Radiopaque surgical clips are seen overlying the right upper  quadrant. IMPRESSION: 1. Right internal jugular venous catheter with its distal tip at the level of the diaphragm. 2. Mild to moderate severity bibasilar atelectasis and/or infiltrate. 3. Small, stable bilateral pleural effusions. Electronically Signed   By: Virgina Norfolk M.D.   On: 12/11/2019 17:59   DG Fluoro Guide CV Line-No Report  Result Date: 12/11/2019 Fluoroscopy was utilized by the requesting physician.  No radiographic interpretation.   . carvedilol  25 mg Oral BID WC  . Chlorhexidine Gluconate Cloth  6 each Topical Daily  . Chlorhexidine Gluconate Cloth  6 each Topical Q0600  . darbepoetin (ARANESP) injection - NON-DIALYSIS  100 mcg Subcutaneous Q Mon-1800  .  furosemide  40 mg Intravenous Daily  . heparin  5,000 Units Subcutaneous Q8H  . hydrALAZINE  25 mg Oral Q8H  . hydrocerin   Topical BID  . insulin aspart  0-5 Units Subcutaneous QHS  . insulin aspart  0-9 Units Subcutaneous TID WC  . isosorbide mononitrate  15 mg Oral Daily  . multivitamin  1 tablet Oral QHS  . sevelamer carbonate  800 mg Oral TID WC  . sodium bicarbonate  650 mg Oral TID  . sodium chloride flush  3 mL Intravenous Once  . tamsulosin  0.4 mg Oral QHS    BMET    Component Value Date/Time   NA 142 12/12/2019 0508   K 4.8 12/12/2019 0508   CL 109 12/12/2019 0508   CO2 17 (L) 12/12/2019 0508   GLUCOSE 121 (H) 12/12/2019 0508   BUN 113 (H) 12/12/2019 0508   CREATININE 8.48 (H) 12/12/2019 0508   CALCIUM 8.0 (L) 12/12/2019 0508   GFRNONAA 6 (L) 12/12/2019 0508   GFRAA 7 (L) 12/12/2019 0508   CBC    Component Value Date/Time   WBC 8.4 12/12/2019 0508   RBC 2.64 (L) 12/12/2019 0508   HGB 7.8 (L) 12/12/2019 0508   HCT 25.5 (L) 12/12/2019 0508   HCT 25.5 (L) 11/29/2019 1103   PLT 150 12/12/2019 0508   MCV 96.6 12/12/2019 0508   MCH 29.5 12/12/2019 0508   MCHC 30.6 12/12/2019 0508   RDW 17.4 (H) 12/12/2019 0508   LYMPHSABS 0.7 11/28/2019 1330   MONOABS 0.5 11/28/2019 1330   EOSABS 0.5 11/28/2019 1330   BASOSABS 0.0 11/28/2019 1330    Assessment/Plan: 1. ESRD:day #1 s/p R IJ Tunneled  HD Cath and L BCF, First HD this afternoon 5/13.  1. Plan to startHD #1 today 5/13, treatment #2 likely Saturday - Tentatively has a spot at [facility] tts but pt may need rehab prior to going home 2. Acute combined systolic and diastolic BMW:[id number] with improved volume status. Continue with IV lasix40mg  daily but likely will stop once starts HD. Cardiology following. 3. Cardiomyopathy with EF of 30-35% G2DD:Cardiology consulted 1. Lexiscan myoviewrevealed reversible ischemia in LCx territory 2. Plan for Left heart cath s/p vascular access and initiating  Dialysis 4. Urgent HTN, improved:on PO Coreg 25mg  BID, PO Hydralazine 25mg  TID, also on Lasix IV 40mg  daily;BP129/68 today,has improved with meds and diuresis - may be able to wean meds once HD gets started 5. Urinary retention:foley placed 12/04/19, remains in place, due to failed voiding trials, think we can try again  6. Anemia of CKD:Hgb 8.3>7.8, iron stores low, given Ferrlecit, started on ESA - cont to be an issue  7. 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  Cellulitis of RLE, improved:UNNA boot in place,s/p Vanc/CTX/flagyl x 3 days, s/p Doxy x10 days 9. PAD:s/p Left BKA  10. 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 Per primary. 11. Anion Gap Metabolic Acidosis: s/p BCF and HDTC procedures, bicarb low at 17, continue Sodium Bicarb 650mg  TID, follow after HD- likely will be able to stop    Milus Banister, [place], PGY-2 12/12/2019 9:28 AM   Patient seen and examined, agree with above note with above modifications. Essentially a new start to HD.  Got access yesterday - appreciate VVS-  Planning for first HD today.  Second HD tentatively for Saturday. Will look to wean BP meds, bicarb and diuretics once HD gets started.   Corliss Parish, MD 12/12/2019

## 2019-12-13 ENCOUNTER — Encounter (HOSPITAL_COMMUNITY)
Admission: EM | Disposition: A | Discharge status: Skilled Nursing Facility | Payer: Self-pay | Source: Home / Self Care | Attending: Internal Medicine

## 2019-12-13 DIAGNOSIS — Z992 Dependence on renal dialysis: Secondary | ICD-10-CM

## 2019-12-13 DIAGNOSIS — N186 End stage renal disease: Secondary | ICD-10-CM

## 2019-12-13 DIAGNOSIS — Z95828 Presence of other vascular implants and grafts: Secondary | ICD-10-CM

## 2019-12-13 DIAGNOSIS — I5021 Acute systolic (congestive) heart failure: Secondary | ICD-10-CM

## 2019-12-13 DIAGNOSIS — I2510 Atherosclerotic heart disease of native coronary artery without angina pectoris: Secondary | ICD-10-CM

## 2019-12-13 HISTORY — PX: RIGHT HEART CATH: CATH118263

## 2019-12-13 HISTORY — PX: LEFT HEART CATH AND CORONARY ANGIOGRAPHY: CATH118249

## 2019-12-13 LAB — CBC
HCT: 24.1 % — ABNORMAL LOW (ref 39.0–52.0)
HCT: 25.3 % — ABNORMAL LOW (ref 39.0–52.0)
Hemoglobin: 7.4 g/dL — ABNORMAL LOW (ref 13.0–17.0)
Hemoglobin: 7.7 g/dL — ABNORMAL LOW (ref 13.0–17.0)
MCH: 29.6 pg (ref 26.0–34.0)
MCH: 29.3 pg (ref 26.0–34.0)
MCHC: 30.7 g/dL (ref 30.0–36.0)
MCHC: 30.4 g/dL (ref 30.0–36.0)
MCV: 96.4 fL (ref 80.0–100.0)
MCV: 96.2 fL (ref 80.0–100.0)
Platelets: 144 10*3/uL — ABNORMAL LOW (ref 150–400)
Platelets: 131 10*3/uL — ABNORMAL LOW (ref 150–400)
RBC: 2.5 MIL/uL — ABNORMAL LOW (ref 4.22–5.81)
RBC: 2.63 MIL/uL — ABNORMAL LOW (ref 4.22–5.81)
RDW: 17.2 % — ABNORMAL HIGH (ref 11.5–15.5)
RDW: 17.3 % — ABNORMAL HIGH (ref 11.5–15.5)
WBC: 8.7 10*3/uL (ref 4.0–10.5)
WBC: 8.6 10*3/uL (ref 4.0–10.5)
nRBC: 0 % (ref 0.0–0.2)
nRBC: 0 % (ref 0.0–0.2)

## 2019-12-13 LAB — POCT I-STAT EG7
Acid-base deficit: 2 mmol/L (ref 0.0–2.0)
Acid-base deficit: 2 mmol/L (ref 0.0–2.0)
Bicarbonate: 23.8 mmol/L (ref 20.0–28.0)
Bicarbonate: 24.2 mmol/L (ref 20.0–28.0)
Calcium, Ion: 1.11 mmol/L — ABNORMAL LOW (ref 1.15–1.40)
Calcium, Ion: 1.12 mmol/L — ABNORMAL LOW (ref 1.15–1.40)
HCT: 21 % — ABNORMAL LOW (ref 39.0–52.0)
HCT: 21 % — ABNORMAL LOW (ref 39.0–52.0)
Hemoglobin: 7.1 g/dL — ABNORMAL LOW (ref 13.0–17.0)
Hemoglobin: 7.1 g/dL — ABNORMAL LOW (ref 13.0–17.0)
O2 Saturation: 62 %
O2 Saturation: 95 %
Potassium: 4.2 mmol/L (ref 3.5–5.1)
Potassium: 4.2 mmol/L (ref 3.5–5.1)
Sodium: 140 mmol/L (ref 135–145)
Sodium: 141 mmol/L (ref 135–145)
TCO2: 25 mmol/L (ref 22–32)
TCO2: 26 mmol/L (ref 22–32)
pCO2, Ven: 43.8 mmHg — ABNORMAL LOW (ref 44.0–60.0)
pCO2, Ven: 45.7 mmHg (ref 44.0–60.0)
pH, Ven: 7.332 (ref 7.250–7.430)
pH, Ven: 7.344 (ref 7.250–7.430)
pO2, Ven: 35 mmHg (ref 32.0–45.0)
pO2, Ven: 82 mmHg — ABNORMAL HIGH (ref 32.0–45.0)

## 2019-12-13 LAB — RENAL FUNCTION PANEL
Albumin: 2.3 g/dL — ABNORMAL LOW (ref 3.5–5.0)
Anion gap: 15 (ref 5–15)
BUN: 115 mg/dL — ABNORMAL HIGH (ref 8–23)
CO2: 21 mmol/L — ABNORMAL LOW (ref 22–32)
Calcium: 7.8 mg/dL — ABNORMAL LOW (ref 8.9–10.3)
Chloride: 105 mmol/L (ref 98–111)
Creatinine, Ser: 8.69 mg/dL — ABNORMAL HIGH (ref 0.61–1.24)
GFR calc Af Amer: 7 mL/min — ABNORMAL LOW (ref >60)
GFR calc non Af Amer: 6 mL/min — ABNORMAL LOW (ref >60)
Glucose, Bld: 132 mg/dL — ABNORMAL HIGH (ref 70–99)
Phosphorus: 8.4 mg/dL — ABNORMAL HIGH (ref 2.5–4.6)
Potassium: 4.6 mmol/L (ref 3.5–5.1)
Sodium: 141 mmol/L (ref 135–145)

## 2019-12-13 LAB — HEPATITIS B SURFACE ANTIBODY,QUALITATIVE: Hep B S Ab: NONREACTIVE

## 2019-12-13 LAB — CREATININE, SERUM
Creatinine, Ser: 6.21 mg/dL — ABNORMAL HIGH (ref 0.61–1.24)
GFR calc Af Amer: 10 mL/min — ABNORMAL LOW (ref >60)
GFR calc non Af Amer: 9 mL/min — ABNORMAL LOW (ref >60)

## 2019-12-13 LAB — HEPATITIS B CORE ANTIBODY, TOTAL: Hep B Core Total Ab: NONREACTIVE

## 2019-12-13 LAB — GLUCOSE, CAPILLARY
Glucose-Capillary: 127 mg/dL — ABNORMAL HIGH (ref 70–99)
Glucose-Capillary: 108 mg/dL — ABNORMAL HIGH (ref 70–99)
Glucose-Capillary: 126 mg/dL — ABNORMAL HIGH (ref 70–99)
Glucose-Capillary: 122 mg/dL — ABNORMAL HIGH (ref 70–99)
Glucose-Capillary: 111 mg/dL — ABNORMAL HIGH (ref 70–99)

## 2019-12-13 LAB — HEPATITIS B SURFACE ANTIGEN: Hepatitis B Surface Ag: NONREACTIVE

## 2019-12-13 SURGERY — LEFT HEART CATH AND CORONARY ANGIOGRAPHY
Anesthesia: LOCAL

## 2019-12-13 MED ORDER — HYDRALAZINE HCL 20 MG/ML IJ SOLN: 10.0000 mg | INTRAMUSCULAR | Status: AC | PRN

## 2019-12-13 MED ORDER — ONDANSETRON HCL 4 MG/2ML IJ SOLN
INTRAMUSCULAR | Status: DC | PRN
  Administered 2019-12-13 (×2): 4 mg via INTRAVENOUS

## 2019-12-13 MED ORDER — IOHEXOL 350 MG/ML SOLN
INTRAVENOUS | Status: DC | PRN
  Administered 2019-12-13: 33 mL via INTRA_ARTERIAL

## 2019-12-13 MED ORDER — SODIUM CHLORIDE 0.9% FLUSH
3.0000 mL | Freq: Two times a day (BID) | INTRAVENOUS | Status: DC
  Administered 2019-12-13 – 2019-12-17 (×7): 3 mL via INTRAVENOUS

## 2019-12-13 MED ORDER — ACETAMINOPHEN 325 MG PO TABS: 650.0000 mg | ORAL_TABLET | ORAL | Status: DC | PRN

## 2019-12-13 MED ORDER — FENTANYL CITRATE (PF) 100 MCG/2ML IJ SOLN
INTRAMUSCULAR | Status: AC
  Filled 2019-12-13: qty 2

## 2019-12-13 MED ORDER — LIDOCAINE HCL (PF) 1 % IJ SOLN
INTRAMUSCULAR | Status: DC | PRN
  Administered 2019-12-13: 15 mL via INTRADERMAL

## 2019-12-13 MED ORDER — ONDANSETRON HCL 4 MG/2ML IJ SOLN: 4.0000 mg | Freq: Four times a day (QID) | INTRAMUSCULAR | Status: DC | PRN

## 2019-12-13 MED ORDER — MIDAZOLAM HCL 2 MG/2ML IJ SOLN
INTRAMUSCULAR | Status: AC
  Filled 2019-12-13: qty 2

## 2019-12-13 MED ORDER — LIDOCAINE-PRILOCAINE 2.5-2.5 % EX CREA: 1.0000 "application " | TOPICAL_CREAM | CUTANEOUS | Status: DC | PRN

## 2019-12-13 MED ORDER — HEPARIN (PORCINE) IN NACL 1000-0.9 UT/500ML-% IV SOLN
INTRAVENOUS | Status: AC
  Filled 2019-12-13: qty 1000

## 2019-12-13 MED ORDER — LIDOCAINE HCL (PF) 1 % IJ SOLN: 5.0000 mL | INTRAMUSCULAR | Status: DC | PRN

## 2019-12-13 MED ORDER — SODIUM CHLORIDE 0.9% FLUSH: 3.0000 mL | INTRAVENOUS | Status: DC | PRN

## 2019-12-13 MED ORDER — ALTEPLASE 2 MG IJ SOLR: 2.0000 mg | Freq: Once | INTRAMUSCULAR | Status: DC | PRN

## 2019-12-13 MED ORDER — ONDANSETRON HCL 4 MG/2ML IJ SOLN
INTRAMUSCULAR | Status: AC
  Filled 2019-12-13: qty 2

## 2019-12-13 MED ORDER — MIDAZOLAM HCL 2 MG/2ML IJ SOLN
INTRAMUSCULAR | Status: DC | PRN
  Administered 2019-12-13: 1 mg via INTRAVENOUS

## 2019-12-13 MED ORDER — LIDOCAINE HCL (PF) 1 % IJ SOLN
INTRAMUSCULAR | Status: AC
  Filled 2019-12-13: qty 30

## 2019-12-13 MED ORDER — HEPARIN (PORCINE) IN NACL 1000-0.9 UT/500ML-% IV SOLN
INTRAVENOUS | Status: DC | PRN
  Administered 2019-12-13: 500 mL

## 2019-12-13 MED ORDER — HEPARIN SODIUM (PORCINE) 5000 UNIT/ML IJ SOLN
5000.0000 [IU] | Freq: Three times a day (TID) | INTRAMUSCULAR | Status: DC
  Administered 2019-12-13 – 2019-12-17 (×10): 5000 [IU] via SUBCUTANEOUS
  Filled 2019-12-13 (×10): qty 1

## 2019-12-13 MED ORDER — CHLORHEXIDINE GLUCONATE CLOTH 2 % EX PADS: 6.0000 | MEDICATED_PAD | Freq: Every day | CUTANEOUS | Status: DC

## 2019-12-13 MED ORDER — SODIUM CHLORIDE 0.9 % IV SOLN: 100.0000 mL | INTRAVENOUS | Status: DC | PRN

## 2019-12-13 MED ORDER — SODIUM CHLORIDE 0.9 % IV SOLN: 250.0000 mL | INTRAVENOUS | Status: DC | PRN

## 2019-12-13 MED ORDER — HEPARIN SODIUM (PORCINE) 1000 UNIT/ML DIALYSIS: 1000.0000 [IU] | INTRAMUSCULAR | Status: DC | PRN

## 2019-12-13 MED ORDER — LABETALOL HCL 5 MG/ML IV SOLN: 10.0000 mg | INTRAVENOUS | Status: AC | PRN

## 2019-12-13 MED ORDER — FENTANYL CITRATE (PF) 100 MCG/2ML IJ SOLN
INTRAMUSCULAR | Status: DC | PRN
  Administered 2019-12-13: 25 ug via INTRAVENOUS

## 2019-12-13 MED ORDER — HEPARIN SODIUM (PORCINE) 1000 UNIT/ML IJ SOLN
INTRAMUSCULAR | Status: AC
  Filled 2019-12-13: qty 4

## 2019-12-13 MED ORDER — PENTAFLUOROPROP-TETRAFLUOROETH EX AERO: 1.0000 "application " | INHALATION_SPRAY | CUTANEOUS | Status: DC | PRN

## 2019-12-13 SURGICAL SUPPLY — 11 items
CATH INFINITI 5FR MULTPACK ANG (CATHETERS) ×1 IMPLANT
CATH SWAN GANZ 7F STRAIGHT (CATHETERS) ×1 IMPLANT
KIT HEART LEFT (KITS) ×2 IMPLANT
PACK CARDIAC CATHETERIZATION (CUSTOM PROCEDURE TRAY) ×2 IMPLANT
SHEATH PINNACLE 5F 10CM (SHEATH) ×1 IMPLANT
SHEATH PINNACLE 7F 10CM (SHEATH) ×1 IMPLANT
SYR MEDRAD MARK 7 150ML (SYRINGE) ×2 IMPLANT
TRANSDUCER W/STOPCOCK (MISCELLANEOUS) ×2 IMPLANT
TUBING CIL FLEX 10 FLL-RA (TUBING) ×2 IMPLANT
WIRE EMERALD 3MM-J .025X260CM (WIRE) ×1 IMPLANT
WIRE EMERALD 3MM-J .035X150CM (WIRE) ×1 IMPLANT

## 2019-12-13 NOTE — Progress Notes (Signed)
NAMEKimo [name], MRN:  [id number], DOB:  1956-01-02, LOS: 47 ADMISSION DATE:  11/28/2019   Brief History  64 yo male with T2DM, HTN, left AKA and right partial foot amputation who was admitted to IMTS on 4/29 for purulent cellulitis of RLE and found to have severe combined heart failure and CKD 5.  Subjective/Interm history  No overnight events Had HD this morning.  Patient feeling well after that.  Discussed plans for heart cath this afternoon.  Significant Hospital Events   4/29 hospital admission 4/30 ortho and neph consulted. Sodium bicarb started 5/1 40mg  lasix>1.6L out. Lopressor started. 5/2 transfused 1U PRBC for hgb of 7. Vanc/rocephin/flagyl stopped>transitioned to doxy. flomax started for urinary retention.  5/3 hgb 8.5. renal function continuing to decline 5/4 cr continuing to rise, cardiology consulted--hydral/imdur started, metop>coreg 5/5 no significant changes. Foley removed however was retaining so foley replaced.  5/6 creatinine plateauing. vasc consulted for permanent access. 5/7 continued renal decline. Creatinine now 6.85. lexiscan, vein mapping. Planning to obtain access for HD on Monday 5/8 no significant events 5/9 no significant changes 5/10 HD access placement pushed back to Wednesday 5/11 creatinine up to 7.2. 5/12 AVF/TDC placed 5/13 no significant events 5/14 first HD session went well.  Heart cath this afternoon  Objective   Blood pressure 126/70, pulse 91, temperature 98.9 F (37.2 C), temperature source Oral, resp. rate (!) 21, height 5\' 3"  (1.6 m), weight 72.8 kg, SpO2 (!) 87 %.     Intake/Output Summary (Last 24 hours) at 12/13/2019 0539 Last data filed at 12/13/2019 0533 Gross per 24 hour  Intake 540 ml  Output 975 ml  Net -435 ml   Filed Weights   12/10/19 0640 12/11/19 0552 12/12/19 0550  Weight: 71.5 kg 71.6 kg 72.8 kg    Examination: GENERAL: in no acute distress CARDIAC: Regular rate and rhythm. PULMONARY: Lungs clear SKIN:  UNA boot to RLE GU: foley  Consults:  Orthopedics-signed off Nephology  Cardiology Vascular surgery Significant Diagnostic Tests:  4/29 R foot MRI>>no evidence of loculated fluid collection or osteomyelitis  4/29 CXR>>b/l effusions with basilar atelectasis vs pna; vascular congestion with diffuse interstitial opacities suspicious for pulm edema  4/30 CT renal study>>no acute abnormalities in abd/pelvis.  4/30 echocardiogram>>Left ventricular ejection fraction, by estimation, is 30 to 35%. The left ventricle has moderately decreased function. The left ventricle demonstrates global hypokinesis. Diffuse hypokinesis worse in the inferior base .  The left ventricular internal cavity size was mildly dilated. There is moderate left ventricular hypertrophy. Left ventricular diastolic parameters are consistent with Grade II diastolic dysfunction (pseudonormalization). Elevated left ventricular end-diastolic pressure. Right ventricular systolic function is moderately reduced. The right ventricular size is mildly enlarged. The mitral valve is normal in structure. Mild mitral valve egurgitation. No evidence of mitral stenosis. The aortic valve is tricuspid. Aortic valve regurgitation is not visualized. Mild aortic valve sclerosis is present, with no evidence of aortic valve stenosis. The inferior vena cava is dilated in size with <50% respiratory variability, suggesting right atrial pressure of 15 mmHg.   5/5 cardiac amyloid scan>not suggestive of amyloid 5/7 lexiscan>large severe apical and inferolateral perfusion defect which is reversible suggestive of possible LCx territory ischemia. LVEF 31% with severe global hypokinesia and inferolateral hypokinesis to akinesis.  Micro Data:  4/29 blood cultures>>NG 5/2 urine cultures>>NG  Antimicrobials:  Vancomycin 4/29>>5/2 Rocephin 4/29>>5/2 Flagyl 4/29>>5/2 Doxycycline 5/2>>5/8  Summary  64 yo male with T2DM and HTN who was admitted to IMTS on 4/29 for  purulent cellulitis of RLE and found to have heart and renal failure. Remaining hospitalized while arranging preparations for HD--access placement planned for 5/12 after which cardiology will do a heart cath.  Resolved/Stable hospital problems  Bacturia-received 3d course of vanc/rocephin/flagyl. No growth on urine culture RLE cellulitis. 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 Likely due to poorly controlled DM and HTN along with CHF.  POD #2 AVF/TDC  First HD session today went well. Urinary retention. Plan: nephrology managing.  --HD per nephrology.  Renal navigator assisting in setting up outpatient HD.  --Continue bicarb and renvela.  --Strict I/Os.  --Continue foley catheter and flomax for urinary retention.  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 EF 30-35% with global LV hypokinesis and G2DD. lexiscan showing severe inferolateral and apical defects.  Hypertension. Blood pressures stable PAD. As indicated on 5/4 ABI Plan: cardiology managing.  -Heart cath today  -Continue hydral, imdur, coreg and lasix. Continue tele monitoring, strict I/Os, daily weights.   T2DM. Blood sugars are remaining stable on SSI.  Best practice:  CODE STATUS: full Diet: cardiac, renal DVT for prophylaxis: heparin Dispo: pending further medical management. HH at discharge. Sleep study and palliative care referral at discharge.     [name], MD INTERNAL MEDICINE RESIDENT PGY-1 PAGER #: [phone] 12/13/19  5:39 AM  Labs    CBC Latest Ref Rng &  Units 12/12/2019 12/10/2019 12/08/2019  WBC 4.0 - 10.5 K/uL 8.4 8.2 9.6  Hemoglobin 13.0 - 17.0 g/dL 7.8(L) 7.9(L) 8.3(L)  Hematocrit 39.0 - 52.0 % 25.5(L) 25.6(L) 26.7(L)  Platelets 150 - 400 K/uL 150 171 193   BMP Latest Ref Rng & Units 12/13/2019 12/12/2019 12/11/2019  Glucose 70 - 99 mg/dL 132(H) 121(H) 108(H)  BUN 8 - 23 mg/dL 115(H) 113(H) 110(H)  Creatinine 0.61 - 1.24 mg/dL 8.69(H) 8.48(H) 7.82(H)  Sodium 135 - 145 mmol/L 141 142 141  Potassium 3.5 - 5.1 mmol/L 4.6 4.8 4.5  Chloride 98 - 111 mmol/L 105 109 110  CO2 22 - 32 mmol/L 21(L) 17(L) 18(L)  Calcium 8.9 - 10.3 mg/dL 7.8(L) 8.0(L) 7.7(L)

## 2019-12-13 NOTE — Progress Notes (Addendum)
Patient's OP HD seat has been changed from [facility] to [facility] clinic. He will still be on a TTS schedule, but seat time is now 11:20am. He needs to arrive to his appointments at 11:00am. On his first day at the clinic, he needs to arrive at 10:15am to complete intake paperwork prior to his first treatment at the clinic. This has been communicated to CSW/N. Rayyan and hopefully the new chair will open up more options for SNF placement for patient. HepB Surface Antigen and HD orders submitted to [facility], as they are now available.  Alphonzo Cruise, [place] Renal Navigator [phone]

## 2019-12-13 NOTE — Progress Notes (Addendum)
[facility] Progress Note    Assessment/ Plan:   1. ESRD:day #1 s/p R IJ Tunneled  HD Cath and L BCF, First HD this afternoon 5/13. Cr 8.48>8.69, with stable GFR 6.  UOP 650 1. Plan to starttoday 5/14, currently in HD.  Seems that dispo is now SNF. 2. Plan for next HD session 5/15, then 5/17.  Tentatively has a spot at [facility] need to coordinate with SNF placement 2. Acute combined systolic and diastolic ZOX:[id number] with improved volume status. Will d/c Lasix as is now on HD. Cardiology following. 3. Cardiomyopathy with EF of 30-35% G2DD:Cardiology consulted 1. Lexiscan myoviewrevealed reversible ischemia in LCx territory 2. Plan for LHC today, 5/14. 4. Urgent HTN, improved:on PO Coreg 25mg  BID, PO Hydralazine 25mg  TID, also on Lasix IV 40mg  daily;BP today 133/75.  HD will also help with BP, therefore will d/c hydral. 5. Urinary retention:foleyplaced5/5/21, remains in place.  Plan to try voiding trial again on 5/15 as he is having LHC today. 6. Anemia of CKD:Hgb today 7.4, iron stores low,givenFerrlecit, started on ESA - cont to be an issue 7. 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 Cellulitis of RLE, improved:UNNA boot in place,s/p Vanc/CTX/flagyl x 3 days, s/pDoxy x10 days 9. PAD:s/p Left BKA  10. 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 Per primary. 11. Anion Gap Metabolic Acidosis: s/p BCF and HDTC procedures, bicarb improving to 21, will d/c sodium bicarb given now on HD.   Patient seen and examined, agree with above note with above modifications. Doing fine.  Tolerated his first HD well, sorry that it took so long to get done.  Will plan for second treatment Sat and third on Monday.  Dispo seems to be SNF-  Will need to coordinate with his OP spot at [facility] HD specific meds as above  Corliss Parish,  MD 12/13/2019     Subjective:   No acute events overnight.  Had HD this AM.  Tolerated well.  Denies any complaints today.  Planning for LHC this afternoon.  As usual - no complaints   Objective:   BP 131/76   Pulse 86   Temp 97.9 F (36.6 C) (Oral)   Resp (!) 22   Ht 5\' 3"  (1.6 m)   Wt 73.4 kg   SpO2 98%   BMI 28.66 kg/m   Intake/Output Summary (Last 24 hours) at 12/13/2019 0914 Last data filed at 12/13/2019 0533 Gross per 24 hour  Intake 540 ml  Output 650 ml  Net -110 ml   Weight change: 1.6 kg  Physical Exam:  General: 64 y.o. male in NAD Cardio: RRR no m/r/g Lungs: CTAB, no wheezing, no rhonchi, no crackles, no IWOB Abdomen: Soft, non-tender to palpation Skin: warm and dry Extremities: 1+ edeme RLE, left arm AVF with thrill, left BKA   Imaging: DG Chest Port 1 View  Result Date: 12/11/2019 CLINICAL DATA:  Status post dialysis catheter insertion. EXAM: PORTABLE CHEST 1 VIEW COMPARISON:  November 28, 2019 FINDINGS: A right internal jugular venous catheter is seen with its distal tip noted at the level of the diaphragm. This represents a new finding when compared to the prior study. Mild to moderate severity atelectasis and/or infiltrate is seen within the bilateral lung bases. Small, stable bilateral pleural effusions are seen. No pneumothorax is identified. The heart size and mediastinal contours are within normal limits. Multilevel degenerative  changes seen throughout the thoracic spine. Radiopaque surgical clips are seen overlying the right upper quadrant. IMPRESSION: 1. Right internal jugular venous catheter with its distal tip at the level of the diaphragm. 2. Mild to moderate severity bibasilar atelectasis and/or infiltrate. 3. Small, stable bilateral pleural effusions. Electronically Signed   By: Virgina Norfolk M.D.   On: 12/11/2019 17:59   DG Fluoro Guide CV Line-No Report  Result Date: 12/11/2019 Fluoroscopy was utilized by the requesting physician.  No  radiographic interpretation.    Labs: BMET Recent Labs  Lab 12/06/19 1315 12/07/19 0522 12/08/19 0546 12/10/19 0352 12/11/19 0407 12/12/19 0508 12/13/19 0248  NA 140 142 142 145 141 142 141  K 4.1 4.2 4.4 4.4 4.5 4.8 4.6  CL 109 109 108 112* 110 109 105  CO2 18* 17* 18* 22 18* 17* 21*  GLUCOSE 136* 113* 119* 126* 108* 121* 132*  BUN 84* 86* 91* 103* 110* 113* 115*  CREATININE 6.85* 6.72* 6.67* 7.21* 7.82* 8.48* 8.69*  CALCIUM 7.9* 7.9* 8.2* 8.0* 7.7* 8.0* 7.8*  PHOS 6.6* 7.0* 7.0* 8.1* 7.9* 8.6* 8.4*   CBC Recent Labs  Lab 12/08/19 0546 12/10/19 0352 12/12/19 0508 12/13/19 0248  WBC 9.6 8.2 8.4 8.7  HGB 8.3* 7.9* 7.8* 7.4*  HCT 26.7* 25.6* 25.5* 24.1*  MCV 95.0 95.9 96.6 96.4  PLT 193 171 150 144*    Medications:    . carvedilol  25 mg Oral BID WC  . Chlorhexidine Gluconate Cloth  6 each Topical Daily  . Chlorhexidine Gluconate Cloth  6 each Topical Q0600  . darbepoetin (ARANESP) injection - NON-DIALYSIS  100 mcg Subcutaneous Q Mon-1800  . furosemide  40 mg Intravenous Daily  . heparin  5,000 Units Subcutaneous Q8H  . hydrALAZINE  25 mg Oral Q8H  . hydrocerin   Topical BID  . insulin aspart  0-5 Units Subcutaneous QHS  . insulin aspart  0-9 Units Subcutaneous TID WC  . isosorbide mononitrate  15 mg Oral Daily  . multivitamin  1 tablet Oral QHS  . sevelamer carbonate  800 mg Oral TID WC  . sodium bicarbonate  650 mg Oral TID  . sodium chloride flush  3 mL Intravenous Once  . sodium chloride flush  3 mL Intravenous Q12H  . tamsulosin  0.4 mg Oral QHS     [place] Constable, DO [facility] Resident, PGY 2 12/13/2019, 9:14 AM

## 2019-12-13 NOTE — Progress Notes (Addendum)
Patient back in bed from dialysis. 1 liter removed per nurse. No meds given.VSS. BG WNL. MD notified at this time. Pt resting comfortably. POC discussed with patient.

## 2019-12-13 NOTE — Progress Notes (Signed)
Site area: right groin  Site Prior to Removal:  Level 0  Pressure Applied For 20 MINUTES    Minutes Beginning at 1640  Manual:   Yes.    Patient Status During Pull:  Stable with frequent congested cough.  Post Pull Groin Site:  Level 0  Post Pull Instructions Given:  Yes.    Post Pull Pulses Present:  Yes.    Dressing Applied:  Yes.    Comments:  Bed rest started at 1700 X 4 hr.

## 2019-12-13 NOTE — Interval H&P Note (Signed)
Cath Lab Visit (complete for each Cath Lab visit)  Clinical Evaluation Leading to the Procedure:   ACS: Yes.    Non-ACS:    Anginal Classification: CCS IV  Anti-ischemic medical therapy: Minimal Therapy (1 class of medications)  Non-Invasive Test Results: High-risk stress test findings: cardiac mortality >3%/year  Prior CABG: No previous CABG      History and Physical Interval Note:  12/13/2019 2:27 PM  [name]  has presented today for surgery, with the diagnosis of systolic heart failure.  The various methods of treatment have been discussed with the patient and family. After consideration of risks, benefits and other options for treatment, the patient has consented to  Procedure(s): LEFT HEART CATH AND CORONARY ANGIOGRAPHY (N/A) as a surgical intervention.  The patient's history has been reviewed, patient examined, no change in status, stable for surgery.  I have reviewed the patient's chart and labs.  Questions were answered to the patient's satisfaction.     [name]

## 2019-12-13 NOTE — Anesthesia Postprocedure Evaluation (Signed)
Anesthesia Post Note  Patient: [name]  Procedure(s) Performed: LEFT BRACHIOCEPHALIC FISTULA CREATION (Left Arm Upper) Insertion Of Dialysis Catheter     Patient location during evaluation: PACU Anesthesia Type: MAC Level of consciousness: awake and alert Pain management: pain level controlled Vital Signs Assessment: post-procedure vital signs reviewed and stable Respiratory status: spontaneous breathing, nonlabored ventilation, respiratory function stable and patient connected to nasal cannula oxygen Cardiovascular status: stable and blood pressure returned to baseline Postop Assessment: no apparent nausea or vomiting Anesthetic complications: no    Last Vitals:  Vitals:   12/13/19 0913 12/13/19 1005  BP: 133/75 (!) 142/73  Pulse: 85 87  Resp: 20 20  Temp: 37.1 C 36.9 C  SpO2: 96% 96%    Last Pain:  Vitals:   12/13/19 1005  TempSrc: Oral  PainSc: 0-No pain                 Kier Smead S

## 2019-12-14 DIAGNOSIS — R0902 Hypoxemia: Secondary | ICD-10-CM

## 2019-12-14 LAB — RENAL FUNCTION PANEL
Albumin: 2.5 g/dL — ABNORMAL LOW (ref 3.5–5.0)
Anion gap: 15 (ref 5–15)
BUN: 83 mg/dL — ABNORMAL HIGH (ref 8–23)
CO2: 20 mmol/L — ABNORMAL LOW (ref 22–32)
Calcium: 8.1 mg/dL — ABNORMAL LOW (ref 8.9–10.3)
Chloride: 105 mmol/L (ref 98–111)
Creatinine, Ser: 6.67 mg/dL — ABNORMAL HIGH (ref 0.61–1.24)
GFR calc Af Amer: 9 mL/min — ABNORMAL LOW (ref >60)
GFR calc non Af Amer: 8 mL/min — ABNORMAL LOW (ref >60)
Glucose, Bld: 87 mg/dL (ref 70–99)
Phosphorus: 7.9 mg/dL — ABNORMAL HIGH (ref 2.5–4.6)
Potassium: 4.8 mmol/L (ref 3.5–5.1)
Sodium: 140 mmol/L (ref 135–145)

## 2019-12-14 LAB — GLUCOSE, CAPILLARY
Glucose-Capillary: 87 mg/dL (ref 70–99)
Glucose-Capillary: 144 mg/dL — ABNORMAL HIGH (ref 70–99)
Glucose-Capillary: 116 mg/dL — ABNORMAL HIGH (ref 70–99)

## 2019-12-14 MED ORDER — HEPARIN SODIUM (PORCINE) 1000 UNIT/ML IJ SOLN
INTRAMUSCULAR | Status: AC
  Filled 2019-12-14: qty 4

## 2019-12-14 NOTE — Progress Notes (Signed)
Progress Note   Subjective   Doing well today, the patient denies CP or SOB.  No new concerns  Inpatient Medications    Scheduled Meds: . carvedilol  25 mg Oral BID WC  . Chlorhexidine Gluconate Cloth  6 each Topical Q0600  . darbepoetin (ARANESP) injection - NON-DIALYSIS  100 mcg Subcutaneous Q Mon-1800  . heparin  5,000 Units Subcutaneous Q8H  . hydrocerin   Topical BID  . insulin aspart  0-5 Units Subcutaneous QHS  . insulin aspart  0-9 Units Subcutaneous TID WC  . isosorbide mononitrate  15 mg Oral Daily  . multivitamin  1 tablet Oral QHS  . sevelamer carbonate  800 mg Oral TID WC  . sodium chloride flush  3 mL Intravenous Once  . sodium chloride flush  3 mL Intravenous Q12H  . sodium chloride flush  3 mL Intravenous Q12H  . tamsulosin  0.4 mg Oral QHS   Continuous Infusions: . sodium chloride     PRN Meds: sodium chloride, acetaminophen, HYDROmorphone, ondansetron (ZOFRAN) IV, sodium chloride flush   Vital Signs    Vitals:   12/14/19 1038 12/14/19 1048 12/14/19 1100 12/14/19 1130  BP: 140/76 (!) 141/76 (!) 142/76 130/71  Pulse: 80 80 80 82  Resp: 20 20 20 18   Temp: 98.2 F (36.8 C)     TempSrc: Oral     SpO2: 100%     Weight: 72.3 kg     Height:        Intake/Output Summary (Last 24 hours) at 12/14/2019 1224 Last data filed at 12/14/2019 0600 Gross per 24 hour  Intake 0 ml  Output 375 ml  Net -375 ml   Filed Weights   12/13/19 1741 12/14/19 0427 12/14/19 1038  Weight: 74.4 kg 72.4 kg 72.3 kg    Telemetry    sinus - Personally Reviewed  Physical Exam   GEN- The patient is chronically ill and frail appearing, alert and oriented x 3 today.  Receiving HD currently Head- normocephalic, atraumatic Eyes-  Sclera clear, conjunctiva pink Ears- hearing intact Oropharynx- clear Neck- supple, Lungs-  normal work of breathing Heart- Regular rate and rhythm  GI- soft  Extremities- no clubbing, cyanosis, + edema  MS- diffuse atrophy Skin- diffuse  ecchymosis Psych- euthymic mood, full affect Neuro- strength and sensation are intact   Labs    Chemistry Recent Labs  Lab 12/12/19 0508 12/12/19 0508 12/13/19 0248 12/13/19 0248 12/13/19 1453 12/13/19 1509 12/13/19 1752 12/14/19 0808  NA 142   < > 141   < > 140 141  --  140  K 4.8   < > 4.6   < > 4.2 4.2  --  4.8  CL 109  --  105  --   --   --   --  105  CO2 17*  --  21*  --   --   --   --  20*  GLUCOSE 121*  --  132*  --   --   --   --  87  BUN 113*  --  115*  --   --   --   --  83*  CREATININE 8.48*   < > 8.69*  --   --   --  6.21* 6.67*  CALCIUM 8.0*  --  7.8*  --   --   --   --  8.1*  ALBUMIN 2.4*  --  2.3*  --   --   --   --  2.5*  GFRNONAA 6*   < > 6*  --   --   --  9* 8*  GFRAA 7*   < > 7*  --   --   --  10* 9*  ANIONGAP 16*  --  15  --   --   --   --  15   < > = values in this interval not displayed.     Hematology Recent Labs  Lab 12/12/19 0508 12/12/19 0508 12/13/19 0248 12/13/19 0248 12/13/19 1453 12/13/19 1509 12/13/19 1752  WBC 8.4  --  8.7  --   --   --  8.6  RBC 2.64*  --  2.50*  --   --   --  2.63*  HGB 7.8*   < > 7.4*   < > 7.1* 7.1* 7.7*  HCT 25.5*   < > 24.1*   < > 21.0* 21.0* 25.3*  MCV 96.6  --  96.4  --   --   --  96.2  MCH 29.5  --  29.6  --   --   --  29.3  MCHC 30.6  --  30.7  --   --   --  30.4  RDW 17.4*  --  17.2*  --   --   --  17.3*  PLT 150  --  144*  --   --   --  131*   < > = values in this interval not displayed.   Patient Profile    64 y.o.malewith vascular disease and renal failure, hypertension, diabetes, admitted with purulent cellulitis of the right lower extremity and found to have severe combined CHF with stage V CKD with new dialysis in hospital planned.     Assessment & Plan    1.  Acute combined systolic and diastolic dysfunction Cath reviewed Cardiomyopathy is out of proportion to cath findings.  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  Medical management is advised Medical therapy is limited by  ESRD.  Will manage volume with HD. Avoid salt. Given diabetic ulcer, not a candidate for ICD at this time. Medical optimization as an outpatient is advised  2. CAD Medical management is advised  3. ESRD Followed by nephrology Day [facility]2 of [facility] today.  4. 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  Thompson Grayer MD, [facility] 12/14/2019 12:24 PM

## 2019-12-14 NOTE — Progress Notes (Signed)
   Subjective: Patient reports feeling well today, no acute events overnight.  Reports that his breathing is about the same.  No issues with his right lower extremity, reports that he still has it.  Discussed plan to repeat HD today and plan for SNF on discharge.  All questions and concerns addressed.  Objective:  Vital signs in last 24 hours: Vitals:   12/13/19 2130 12/13/19 2200 12/14/19 0023 12/14/19 0427  BP:   130/69 130/69  Pulse:   79 82  Resp: (!) 23 17 18 18   Temp:   98.3 F (36.8 C) 98.7 F (37.1 C)  TempSrc:   Oral Oral  SpO2:   94% 100%  Weight:    72.4 kg  Height:       General: Middle-aged male, no acute distress, sitting up in bed Cardiac: Regular rate and rhythm, no murmurs rubs or gallops  Pulmonary: Bibasilar crackles, no wheezing or rhonchi, 2 L nasal cannula Extremities: Left upper extremity fistula in place with patent thrill, left BKA, right lower extremity bandaged  Assessment/Plan:  Principal Problem:   Type 2 diabetes mellitus with diabetic foot infection (HCC) Active Problems:   Anemia   Uncontrolled hypertension   CKD (chronic kidney disease), stage V (HCC)   Pressure injury of skin   ESRD on dialysis (HCC)   PAD (peripheral artery disease) (HCC)   Acute systolic heart failure (HCC)   S/P dialysis catheter insertion ([place])  64 yo male with T2DM and HTN who was admitted to [facility] on 4/29 for purulent cellulitis of RLE and found to have new onset heart and renal failure. Remaining hospitalized while arranging preparations for HD--access placement on 5/12, cardiac cath done on 12/13/19. Now undergoing dialysis and arranging disposition.   HD dependent ESRD. Likely due to poorly controlled DM and HTN along with CHF.  POD #3 AVF/TDC  Second HD session today, then 5/17. Plan for MWF dialysis.  Urinary retention. Foley still in place, attempting trial of voiding today, will obtain bladder scan following removal.  Plan: nephrology managing.  --HD per  nephrology.  Renal navigator assisting in setting up outpatient HD.  --Continue bicarb and renvela.  --Strict I/Os.  --Discontinue foley catheter and flomax for urinary retention. Bladder scan a few hours after this, will replace if still retaining.  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 EF 30-35% with global LV hypokinesis and G2DD. lexiscan showing severe inferolateral and apical defects. Cardiac cath showed moderate CAD, cardiomyopathy out of proportion to CAD, ischemia may be a component, elevated LVEDP, continue medical therapy for LV dysfunction.  Hypertension. Blood pressures stable PAD. As indicated on 5/4 ABI Plan:  -Cardiology following, appreciate recommendations.  -Continue hydral, imdur, coreg and lasix. Continue tele monitoring, strict I/Os, daily weights.   RLE of cellulitis: Received 10 days of antibiotics. Afebrile with no leukocytosis. No further work up.   T2DM. Blood sugars are remaining stable on SSI.  Prior to Admission Living Arrangement: Home Anticipated Discharge Location: SNF Barriers to Discharge: SNF placement and dialysis  Dispo: Anticipated discharge in approximately 2-3 day(s).   Asencion Noble, MD 12/14/2019, 6:27 AM Pager: [phone]

## 2019-12-14 NOTE — Progress Notes (Signed)
  Date: 12/14/2019  Patient name: [name]  Medical record number: [id number]  Date of birth: 1956-06-23        I have seen and evaluated this patient and I have discussed the plan of care with the house staff. Please see Dr. Dorothyann Peng note for complete details. I concur with her findings and plan.  Patient is nearing discharge with outpatient HD being set up and SNF placement.  Consider getting repeat SARs testing on Monday.   Sid Falcon, MD 12/14/2019, 9:31 PM

## 2019-12-14 NOTE — Progress Notes (Signed)
[facility] Progress Note    Assessment/ Plan:   1. ESRD:day #2 s/p R IJ Tunneled  HD Cath and L BCF, first dialysis yesterday with no issues 1. Continue dialysis second session today.  Plan for Monday Wednesday Friday dialysis thereafter.  2. Plan for next HD today, then 5/17.  Tentatively has a spot at [facility] need to coordinate with SNF placement 2. Acute combined systolic and diastolic VOH:[id number] with dialysis cardiology following. 3. Cardiomyopathy with EF of 30-35% G2DD:Cardiology consulted 1. Left heart cath with diffuse coronary artery disease. 4. Urgent HTN, improved:on PO Coreg 25mg  BID.  With dialysis 5. Urinary retention:foleyplaced5/5/21, remains in place.  Plan to try voiding trial again on 5/15 as he is having LHC today. 6. Anemia of CKD:Hgb today 7.4, iron stores low,givenFerrlecit, started on ESA - cont to be an issue 7. Secondary PTH:in setting of ESRD, Phos elevated to 7.9 and iPTH 189, continue with hemodialysis, continue sevelamer 800 mg 3 times daily.  Continue to monitor on dialysis 8. Cellulitis of RLE, improved:UNNA boot in place,s/p Vanc/CTX/flagyl x 3 days, s/pDoxy x10 days 9. PAD:s/p Left BKA  10. 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 Per primary. 11. Anion Gap Metabolic Acidosis: No longer requiring sodium bicarb.  Continue dialysis.      Subjective:   Hemodialysis yesterday with no issues.  1.5 L of ultrafiltration.  Left heart catheterization with diffuse coronary artery disease.  Expresses no complaints at this time.   Objective:   BP 130/71   Pulse 82   Temp 98.2 F (36.8 C) (Oral)   Resp 18   Ht 5\' 3"  (1.6 m)   Wt 72.3 kg   SpO2 100%   BMI 28.24 kg/m   Intake/Output Summary (Last 24 hours) at 12/14/2019 1223 Last data filed at 12/14/2019 0600 Gross per 24 hour  Intake 0 ml  Output 375 ml  Net -375 ml   Weight change: -2.2 kg  Physical Exam:  General: 64 y.o. male in  NAD Cardio: RRR no m/r/g Lungs: CTAB, no wheezing, no rhonchi, no crackles, no IWOB Abdomen: Soft, non-tender to palpation Skin: warm and dry Extremities: 1+ edeme RLE, left arm AVF with thrill, left BKA   Imaging: CARDIAC CATHETERIZATION  Addendum Date: 12/13/2019    RPDA lesion is 50% stenosed.  Mid LM to Dist LM lesion is 50% stenosed.  Prox Cx to Mid Cx lesion is 100% stenosed.  Mid LAD lesion is 75% stenosed.  Dist LAD-1 lesion is 50% stenosed.  Dist LAD-2 lesion is 90% stenosed.  LV end diastolic pressure is moderately elevated.  There is no aortic valve stenosis.  Diffuse CAD. He denied angina. He has had DOE.  Hemodynamic findings consistent with moderate pulmonary hypertension.  Ao sat 95%, PA sat 62%, mean PA pressure 36 mm Hg; mean PCWP 23 mm Hg; CO 7.0 L/min; CI 4  Moderate CAD.  Cardiomyopathy seems out of proportion to CAD, but ischemia may be a component of the LV dysfunction.  Elevated LVEDP.  Patient lying on a wedge due to shortness of breath.  Continue medical therapy for LV dysfunction.  He did not want any family notified.   Addendum Date: 12/13/2019    RPDA lesion is 50% stenosed.  Mid LM to Dist LM lesion is 50% stenosed.  Prox Cx to Mid Cx lesion is 100% stenosed.  Mid LAD lesion is 75% stenosed.  Dist LAD-1 lesion is 50% stenosed.  Dist LAD-2 lesion is 90% stenosed.  LV  end diastolic pressure is moderately elevated.  There is no aortic valve stenosis.  Diffuse CAD. He denied angina. He has had DOE.  Moderate CAD.  Cardiomyopathy seems out of proportion to CAD, but ischemia may be a component of the LV dysfunction.  Elevated LVEDP.  Patient lying on a wedge due to shortness of breath.  Continue medical therapy for LV dysfunction.  He did not want any family notified.   Result Date: 12/13/2019  RPDA lesion is 50% stenosed.  Mid LM to Dist LM lesion is 50% stenosed.  Prox Cx to Mid Cx lesion is 100% stenosed.  Mid LAD lesion is 75% stenosed.  Dist LAD-1  lesion is 50% stenosed.  Dist LAD-2 lesion is 90% stenosed.  LV end diastolic pressure is moderately elevated.  There is no aortic valve stenosis.  Diffuse CAD.  Moderate CAD.  Cardiomyopathy seems out of proportion to CAD, but ischemia may be a component of the LV dysfunction.  Elevated LVEDP.  Patient lying on a wedge due to shortness of breath.  Continue medical therapy for LV dysfunction.  He did not want any family notified.    Labs: BMET Recent Labs  Lab 12/08/19 0546 12/08/19 0546 12/10/19 0352 12/11/19 0407 12/12/19 0508 12/13/19 0248 12/13/19 1453 12/13/19 1509 12/13/19 1752 12/14/19 0808  NA 142   < > 145 141 142 141 140 141  --  140  K 4.4   < > 4.4 4.5 4.8 4.6 4.2 4.2  --  4.8  CL 108  --  112* 110 109 105  --   --   --  105  CO2 18*  --  22 18* 17* 21*  --   --   --  20*  GLUCOSE 119*  --  126* 108* 121* 132*  --   --   --  87  BUN 91*  --  103* 110* 113* 115*  --   --   --  83*  CREATININE 6.67*  --  7.21* 7.82* 8.48* 8.69*  --   --  6.21* 6.67*  CALCIUM 8.2*  --  8.0* 7.7* 8.0* 7.8*  --   --   --  8.1*  PHOS 7.0*  --  8.1* 7.9* 8.6* 8.4*  --   --   --  7.9*   < > = values in this interval not displayed.   CBC Recent Labs  Lab 12/10/19 0352 12/10/19 0352 12/12/19 0508 12/12/19 0508 12/13/19 0248 12/13/19 1453 12/13/19 1509 12/13/19 1752  WBC 8.2  --  8.4  --  8.7  --   --  8.6  HGB 7.9*   < > 7.8*   < > 7.4* 7.1* 7.1* 7.7*  HCT 25.6*   < > 25.5*   < > 24.1* 21.0* 21.0* 25.3*  MCV 95.9  --  96.6  --  96.4  --   --  96.2  PLT 171  --  150  --  144*  --   --  131*   < > = values in this interval not displayed.    Medications:    . carvedilol  25 mg Oral BID WC  . Chlorhexidine Gluconate Cloth  6 each Topical Q0600  . darbepoetin (ARANESP) injection - NON-DIALYSIS  100 mcg Subcutaneous Q Mon-1800  . heparin  5,000 Units Subcutaneous Q8H  . hydrocerin   Topical BID  . insulin aspart  0-5 Units Subcutaneous QHS  . insulin aspart  0-9 Units  Subcutaneous TID WC  .  isosorbide mononitrate  15 mg Oral Daily  . multivitamin  1 tablet Oral QHS  . sevelamer carbonate  800 mg Oral TID WC  . sodium chloride flush  3 mL Intravenous Once  . sodium chloride flush  3 mL Intravenous Q12H  . sodium chloride flush  3 mL Intravenous Q12H  . tamsulosin  0.4 mg Oral QHS     12/14/2019, 12:23 PM

## 2019-12-15 LAB — CBC
HCT: 25.7 % — ABNORMAL LOW (ref 39.0–52.0)
Hemoglobin: 8 g/dL — ABNORMAL LOW (ref 13.0–17.0)
MCH: 30.5 pg (ref 26.0–34.0)
MCHC: 31.1 g/dL (ref 30.0–36.0)
MCV: 98.1 fL (ref 80.0–100.0)
Platelets: 131 10*3/uL — ABNORMAL LOW (ref 150–400)
RBC: 2.62 MIL/uL — ABNORMAL LOW (ref 4.22–5.81)
RDW: 17.2 % — ABNORMAL HIGH (ref 11.5–15.5)
WBC: 8.5 10*3/uL (ref 4.0–10.5)
nRBC: 0 % (ref 0.0–0.2)

## 2019-12-15 LAB — RENAL FUNCTION PANEL
Albumin: 2.4 g/dL — ABNORMAL LOW (ref 3.5–5.0)
Anion gap: 11 (ref 5–15)
BUN: 46 mg/dL — ABNORMAL HIGH (ref 8–23)
CO2: 26 mmol/L (ref 22–32)
Calcium: 8.1 mg/dL — ABNORMAL LOW (ref 8.9–10.3)
Chloride: 102 mmol/L (ref 98–111)
Creatinine, Ser: 4.71 mg/dL — ABNORMAL HIGH (ref 0.61–1.24)
GFR calc Af Amer: 14 mL/min — ABNORMAL LOW (ref >60)
GFR calc non Af Amer: 12 mL/min — ABNORMAL LOW (ref >60)
Glucose, Bld: 113 mg/dL — ABNORMAL HIGH (ref 70–99)
Phosphorus: 6.1 mg/dL — ABNORMAL HIGH (ref 2.5–4.6)
Potassium: 4 mmol/L (ref 3.5–5.1)
Sodium: 139 mmol/L (ref 135–145)

## 2019-12-15 LAB — GLUCOSE, CAPILLARY
Glucose-Capillary: 114 mg/dL — ABNORMAL HIGH (ref 70–99)
Glucose-Capillary: 115 mg/dL — ABNORMAL HIGH (ref 70–99)
Glucose-Capillary: 131 mg/dL — ABNORMAL HIGH (ref 70–99)
Glucose-Capillary: 141 mg/dL — ABNORMAL HIGH (ref 70–99)

## 2019-12-15 MED ORDER — ATORVASTATIN CALCIUM 10 MG PO TABS
20.0000 mg | ORAL_TABLET | Freq: Every day | ORAL | Status: DC
  Administered 2019-12-15 – 2019-12-17 (×3): 20 mg via ORAL
  Filled 2019-12-15 (×3): qty 2

## 2019-12-15 NOTE — Progress Notes (Signed)
NAMETalen [name], MRN:  [id number], DOB:  07-05-1956, LOS: 46 ADMISSION DATE:  11/28/2019   Brief History  64 yo male with T2DM, HTN, left AKA and right partial foot amputation who was admitted to IMTS on 4/29 for purulent cellulitis of RLE and found to have severe combined heart failure and CKD 5.  Subjective/Interm history  Pt endorsed nausea since HD yesterday. While in the room, pt began dry heaving.  Significant Hospital Events   4/29 hospital admission 4/30 ortho and neph consulted. Sodium bicarb started 5/1 40mg  lasix>1.6L out. Lopressor started. 5/2 transfused 1U PRBC for hgb of 7. Vanc/rocephin/flagyl stopped>transitioned to doxy. flomax started for urinary retention.  5/3 hgb 8.5. renal function continuing to decline 5/4 cr continuing to rise, cardiology consulted--hydral/imdur started, metop>coreg 5/5 no significant changes. Foley removed however was retaining so foley replaced.  5/6 creatinine plateauing. vasc consulted for permanent access. 5/7 continued renal decline. Creatinine now 6.85. lexiscan, vein mapping. Planning to obtain access for HD on Monday 5/8 no significant events 5/9 no significant changes 5/10 HD access placement pushed back to Wednesday 5/11 creatinine up to 7.2. 5/12 AVF/TDC placed 5/13 no significant events 5/14 first HD session went well.  [place]  5/15 Foley removed.  HD. Arranging dispo to SNF  Objective   Blood pressure 135/68, pulse 78, temperature 98.4 F (36.9 C), temperature source Oral, resp. rate (!) 22, height 5\' 3"  (1.6 m), weight 70.9 kg, SpO2 99 %.     Intake/Output Summary (Last 24 hours) at 12/15/2019 0706 Last data filed at 12/14/2019 1415 Gross per 24 hour  Intake --  Output 1500 ml  Net -1500 ml   Filed Weights   12/14/19 1038 12/14/19 1355 12/15/19 0500  Weight: 72.3 kg 70.9 kg 70.9 kg    Examination: GENERAL: in no acute distress CARDIAC: Regular rate and rhythm. PULMONARY: Lungs clear SKIN: UNA boot to RLE GU:  foley  Consults:  Orthopedics-signed off Nephology  Cardiology Vascular surgery Significant Diagnostic Tests:  4/29 R foot MRI>>no evidence of loculated fluid collection or osteomyelitis  4/29 CXR>>b/l effusions with basilar atelectasis vs pna; vascular congestion with diffuse interstitial opacities suspicious for pulm edema  4/30 CT renal study>>no acute abnormalities in abd/pelvis.  4/30 echocardiogram>>Left ventricular ejection fraction, by estimation, is 30 to 35%. The left ventricle has moderately decreased function. The left ventricle demonstrates global hypokinesis. Diffuse hypokinesis worse in the inferior base .  The left ventricular internal cavity size was mildly dilated. There is moderate left ventricular hypertrophy. Left ventricular diastolic parameters are consistent with Grade II diastolic dysfunction (pseudonormalization). Elevated left ventricular end-diastolic pressure. Right ventricular systolic function is moderately reduced. The right ventricular size is mildly enlarged. The mitral valve is normal in structure. Mild mitral valve egurgitation. No evidence of mitral stenosis. The aortic valve is tricuspid. Aortic valve regurgitation is not visualized. Mild aortic valve sclerosis is present, with no evidence of aortic valve stenosis. The inferior vena cava is dilated in size with <50% respiratory variability, suggesting right atrial pressure of 15 mmHg.   5/5 cardiac amyloid scan>not suggestive of amyloid 5/7 lexiscan>large severe apical and inferolateral perfusion defect which is reversible suggestive of possible LCx territory ischemia. LVEF 31% with severe global hypokinesia and inferolateral hypokinesis to akinesis. 5/14 LHC> proximal to mid circumflex 70% stenosis, mid LAD 75%, distal LAD 50% and 90% stenosis, LV end-diastolic pressure moderately elevated, mean pulmonary artery pressure 36, wedge pressure 23, cardiac output 7  Micro Data:  4/29 blood cultures>>NG 5/2  urine cultures>>NG  Antimicrobials:  Vancomycin 4/29>>5/2 Rocephin 4/29>>5/2 Flagyl 4/29>>5/2 Doxycycline 5/2>>5/8  Summary  64 yo male with T2DM and HTN who was admitted to IMTS on 4/29 for purulent cellulitis of RLE and found to have heart and renal failure.  Renal failure progression to end-stage requiring hemodialysis.  He underwent tunneled dialysis catheter and AV fistula placement on 5/12 and is now on dialysis.  He underwent a left heart cath on 5/14 which showed moderate coronary artery disease.  We are now awaiting HD arrangements for outpatient and SNF placement.  Resolved/Stable hospital problems  Bacturia-received 3d course of vanc/rocephin/flagyl. No growth on urine culture RLE cellulitis. 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 Likely due to poorly controlled DM and HTN along with CHF.  AVF/TDC placed 5/12.  Now undergoing HD per nephrology Nausea likely 2/2 new HD Urinary retention.  Foley removed 5/15.  Does not seem to be retaining at this time. Plan: nephrology managing.  --HD per nephrology.  Renal navigator assisting in setting up outpatient HD.  --Zofran for nausea --Continue renvela.  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 EF 30-35% with global LV hypokinesis and G2DD. lexiscan showing severe inferolateral and apical defects.  Coronary artery disease.  5/14 left heart cath with moderate CAD Hypertension. Blood  pressures stable PAD. As indicated on 5/4 ABI Plan: cardiology managing.  -Continue imdur and coreg. Will start lipitor 20mg . Continue tele monitoring, strict I/Os, daily weights.   Anemia of chronic disease.  Likely due to his end-stage renal disease.  Receiving Aranesp per nephrology.  We will continue to intermittently monitor hemoglobin.  Transfuse for hemoglobin less than 7. Thrombocytopenia.  Continue to monitor  T2DM. Blood sugars are remaining stable on SSI.  Physical deconditioning.  Continue PT.  Best practice:  CODE STATUS: full Diet: cardiac, renal DVT for prophylaxis: heparin subcut Dispo: Stable for discharge pending SNF placement and HD outpatient arrangements. Sleep study and palliative care referral at discharge.     [name], MD INTERNAL MEDICINE RESIDENT PGY-1 PAGER #: [phone] 12/15/19  7:06 AM  Labs    CBC Latest Ref Rng & Units 12/13/2019 12/13/2019 12/13/2019  WBC 4.0 - 10.5 K/uL 8.6 - -  Hemoglobin 13.0 - 17.0 g/dL 7.7(L) 7.1(L) 7.1(L)  Hematocrit 39.0 - 52.0 % 25.3(L) 21.0(L) 21.0(L)  Platelets 150 - 400 K/uL 131(L) - -   BMP Latest Ref Rng & Units 12/14/2019 12/13/2019 12/13/2019  Glucose 70 - 99 mg/dL 87 - -  BUN 8 - 23 mg/dL 83(H) - -  Creatinine 0.61 - 1.24 mg/dL 6.67(H) 6.21(H) -  Sodium 135 - 145 mmol/L 140 - 141  Potassium 3.5 - 5.1 mmol/L 4.8 - 4.2  Chloride 98 - 111 mmol/L 105 - -  CO2 22 - 32 mmol/L 20(L) - -  Calcium 8.9 - 10.3 mg/dL 8.1(L) - -

## 2019-12-15 NOTE — Progress Notes (Signed)
[facility] Progress Note    Assessment/ Plan:   1. ESRD:day #2 s/p R IJ Tunneled  HD Cath and L BCF, first dialysis yesterday with no issues 1. Tolerated dialysis yesterday.  Planning for TTS schedule given this will be his outpatient chair schedule 2. Plan for next HD today, then 5/17.  Tentatively has a spot at [place] need to coordinate with SNF placement 2. Acute combined systolic and diastolic BLT:[id number] with dialysis cardiology following. 3. Cardiomyopathy with EF of 30-35% G2DD:Cardiology consulted 1. Left heart cath with diffuse coronary artery disease. 4. Urgent HTN, improved:on PO Coreg 25mg  BID.  Improved with dialysis 5. Urinary retention:foleyplaced5/5/21, remains in place.  Status post voiding trial and Foley removed. 6. Anemia of CKD:Hgb today 8, iron stores low,givenFerrlecit, started on ESA -continue to monitor 7. 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  Phosphorus improving with binders and dialysis 8. Cellulitis of RLE, improved:UNNA boot in place,s/p Vanc/CTX/flagyl x 3 days, s/pDoxy x10 days 9. PAD:s/p Left BKA  10. 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 Per primary. 11. Anion Gap Metabolic Acidosis: No longer requiring sodium bicarb.  Continue dialysis.      Subjective:   Hemodialysis yesterday with no issues.  1.5 L of ultrafiltration.  No complaints at this time.   Objective:   BP 124/64   Pulse 78   Temp 98.4 F (36.9 C) (Oral)   Resp (!) 22   Ht 5\' 3"  (1.6 m)   Wt 70.9 kg   SpO2 99%   BMI 27.69 kg/m   Intake/Output Summary (Last 24 hours) at 12/15/2019 1137 Last data filed at 12/14/2019 1415 Gross per 24 hour  Intake --  Output 1500 ml  Net -1500 ml   Weight change: 0.1 kg  Physical Exam:  General: 64 y.o. male in NAD, sitting in bed Cardio: RRR no m/r/g Lungs: CTAB, no wheezing, no rhonchi, no crackles, no IWOB Abdomen: Soft, nondistended Skin: warm and dry Extremities:  1+ edeme RLE, left arm AVF with thrill, left BKA   Imaging: CARDIAC CATHETERIZATION  Addendum Date: 12/13/2019    RPDA lesion is 50% stenosed.  Mid LM to Dist LM lesion is 50% stenosed.  Prox Cx to Mid Cx lesion is 100% stenosed.  Mid LAD lesion is 75% stenosed.  Dist LAD-1 lesion is 50% stenosed.  Dist LAD-2 lesion is 90% stenosed.  LV end diastolic pressure is moderately elevated.  There is no aortic valve stenosis.  Diffuse CAD. He denied angina. He has had DOE.  Hemodynamic findings consistent with moderate pulmonary hypertension.  Ao sat 95%, PA sat 62%, mean PA pressure 36 mm Hg; mean PCWP 23 mm Hg; CO 7.0 L/min; CI 4  Moderate CAD.  Cardiomyopathy seems out of proportion to CAD, but ischemia may be a component of the LV dysfunction.  Elevated LVEDP.  Patient lying on a wedge due to shortness of breath.  Continue medical therapy for LV dysfunction.  He did not want any family notified.   Addendum Date: 12/13/2019    RPDA lesion is 50% stenosed.  Mid LM to Dist LM lesion is 50% stenosed.  Prox Cx to Mid Cx lesion is 100% stenosed.  Mid LAD lesion is 75% stenosed.  Dist LAD-1 lesion is 50% stenosed.  Dist LAD-2 lesion is 90% stenosed.  LV end diastolic pressure is moderately elevated.  There is no aortic valve stenosis.  Diffuse CAD. He denied angina. He has had DOE.  Moderate CAD.  Cardiomyopathy seems  out of proportion to CAD, but ischemia may be a component of the LV dysfunction.  Elevated LVEDP.  Patient lying on a wedge due to shortness of breath.  Continue medical therapy for LV dysfunction.  He did not want any family notified.   Result Date: 12/13/2019  RPDA lesion is 50% stenosed.  Mid LM to Dist LM lesion is 50% stenosed.  Prox Cx to Mid Cx lesion is 100% stenosed.  Mid LAD lesion is 75% stenosed.  Dist LAD-1 lesion is 50% stenosed.  Dist LAD-2 lesion is 90% stenosed.  LV end diastolic pressure is moderately elevated.  There is no aortic valve stenosis.  Diffuse  CAD.  Moderate CAD.  Cardiomyopathy seems out of proportion to CAD, but ischemia may be a component of the LV dysfunction.  Elevated LVEDP.  Patient lying on a wedge due to shortness of breath.  Continue medical therapy for LV dysfunction.  He did not want any family notified.    Labs: BMET Recent Labs  Lab 12/10/19 0352 12/10/19 0352 12/11/19 0407 12/12/19 5329 12/13/19 0248 12/13/19 1453 12/13/19 1509 12/13/19 1752 12/14/19 0808 12/15/19 0612  NA 145   < > 141 142 141 140 141  --  140 139  K 4.4   < > 4.5 4.8 4.6 4.2 4.2  --  4.8 4.0  CL 112*  --  110 109 105  --   --   --  105 102  CO2 22  --  18* 17* 21*  --   --   --  20* 26  GLUCOSE 126*  --  108* 121* 132*  --   --   --  87 113*  BUN 103*  --  110* 113* 115*  --   --   --  83* 46*  CREATININE 7.21*  --  7.82* 8.48* 8.69*  --   --  6.21* 6.67* 4.71*  CALCIUM 8.0*  --  7.7* 8.0* 7.8*  --   --   --  8.1* 8.1*  PHOS 8.1*  --  7.9* 8.6* 8.4*  --   --   --  7.9* 6.1*   < > = values in this interval not displayed.   CBC Recent Labs  Lab 12/12/19 0508 12/12/19 0508 12/13/19 0248 12/13/19 0248 12/13/19 1453 12/13/19 1509 12/13/19 1752 12/15/19 0612  WBC 8.4  --  8.7  --   --   --  8.6 8.5  HGB 7.8*   < > 7.4*   < > 7.1* 7.1* 7.7* 8.0*  HCT 25.5*   < > 24.1*   < > 21.0* 21.0* 25.3* 25.7*  MCV 96.6  --  96.4  --   --   --  96.2 98.1  PLT 150  --  144*  --   --   --  131* 131*   < > = values in this interval not displayed.    Medications:    . atorvastatin  20 mg Oral Daily  . carvedilol  25 mg Oral BID WC  . Chlorhexidine Gluconate Cloth  6 each Topical Q0600  . darbepoetin (ARANESP) injection - NON-DIALYSIS  100 mcg Subcutaneous Q Mon-1800  . heparin  5,000 Units Subcutaneous Q8H  . hydrocerin   Topical BID  . insulin aspart  0-5 Units Subcutaneous QHS  . insulin aspart  0-9 Units Subcutaneous TID WC  . isosorbide mononitrate  15 mg Oral Daily  . multivitamin  1 tablet Oral QHS  . sevelamer carbonate  800  mg  Oral TID WC  . sodium chloride flush  3 mL Intravenous Once  . sodium chloride flush  3 mL Intravenous Q12H  . sodium chloride flush  3 mL Intravenous Q12H  . tamsulosin  0.4 mg Oral QHS     12/15/2019, 11:37 AM

## 2019-12-16 DIAGNOSIS — D688 Other specified coagulation defects: Secondary | ICD-10-CM | POA: Insufficient documentation

## 2019-12-16 DIAGNOSIS — I429 Cardiomyopathy, unspecified: Secondary | ICD-10-CM | POA: Insufficient documentation

## 2019-12-16 DIAGNOSIS — R52 Pain, unspecified: Secondary | ICD-10-CM | POA: Insufficient documentation

## 2019-12-16 DIAGNOSIS — T7840XA Allergy, unspecified, initial encounter: Secondary | ICD-10-CM | POA: Insufficient documentation

## 2019-12-16 DIAGNOSIS — T782XXA Anaphylactic shock, unspecified, initial encounter: Secondary | ICD-10-CM | POA: Insufficient documentation

## 2019-12-16 DIAGNOSIS — E213 Hyperparathyroidism, unspecified: Secondary | ICD-10-CM | POA: Insufficient documentation

## 2019-12-16 DIAGNOSIS — E889 Metabolic disorder, unspecified: Secondary | ICD-10-CM | POA: Insufficient documentation

## 2019-12-16 DIAGNOSIS — I509 Heart failure, unspecified: Secondary | ICD-10-CM | POA: Insufficient documentation

## 2019-12-16 LAB — GLUCOSE, CAPILLARY
Glucose-Capillary: 111 mg/dL — ABNORMAL HIGH (ref 70–99)
Glucose-Capillary: 141 mg/dL — ABNORMAL HIGH (ref 70–99)
Glucose-Capillary: 160 mg/dL — ABNORMAL HIGH (ref 70–99)
Glucose-Capillary: 151 mg/dL — ABNORMAL HIGH (ref 70–99)

## 2019-12-16 LAB — RENAL FUNCTION PANEL
Albumin: 2.4 g/dL — ABNORMAL LOW (ref 3.5–5.0)
Anion gap: 12 (ref 5–15)
BUN: 57 mg/dL — ABNORMAL HIGH (ref 8–23)
CO2: 23 mmol/L (ref 22–32)
Calcium: 7.8 mg/dL — ABNORMAL LOW (ref 8.9–10.3)
Chloride: 102 mmol/L (ref 98–111)
Creatinine, Ser: 5.95 mg/dL — ABNORMAL HIGH (ref 0.61–1.24)
GFR calc Af Amer: 11 mL/min — ABNORMAL LOW (ref >60)
GFR calc non Af Amer: 9 mL/min — ABNORMAL LOW (ref >60)
Glucose, Bld: 112 mg/dL — ABNORMAL HIGH (ref 70–99)
Phosphorus: 6.8 mg/dL — ABNORMAL HIGH (ref 2.5–4.6)
Potassium: 4.1 mmol/L (ref 3.5–5.1)
Sodium: 137 mmol/L (ref 135–145)

## 2019-12-16 LAB — SARS CORONAVIRUS 2 (TAT 6-24 HRS): SARS Coronavirus 2: NEGATIVE

## 2019-12-16 MED ORDER — CARVEDILOL 25 MG PO TABS: 25.0000 mg | ORAL_TABLET | Freq: Two times a day (BID) | ORAL | Status: DC

## 2019-12-16 MED ORDER — SEVELAMER CARBONATE 800 MG PO TABS: 1600.0000 mg | ORAL_TABLET | Freq: Three times a day (TID) | ORAL | Status: DC

## 2019-12-16 MED ORDER — ISOSORBIDE MONONITRATE ER 30 MG PO TB24: 15.0000 mg | ORAL_TABLET | Freq: Every day | ORAL | Status: DC

## 2019-12-16 MED ORDER — RENA-VITE PO TABS: 1.0000 | ORAL_TABLET | Freq: Every day | ORAL | 0 refills | Status: DC

## 2019-12-16 MED ORDER — SEVELAMER CARBONATE 800 MG PO TABS
1600.0000 mg | ORAL_TABLET | Freq: Three times a day (TID) | ORAL | Status: DC
  Administered 2019-12-16 – 2019-12-17 (×3): 1600 mg via ORAL
  Filled 2019-12-16 (×4): qty 2

## 2019-12-16 MED ORDER — ATORVASTATIN CALCIUM 20 MG PO TABS: 20.0000 mg | ORAL_TABLET | Freq: Every day | ORAL | Status: DC

## 2019-12-16 MED ORDER — TAMSULOSIN HCL 0.4 MG PO CAPS: 0.4000 mg | ORAL_CAPSULE | Freq: Every day | ORAL | Status: DC

## 2019-12-16 MED ORDER — HYDROCERIN EX CREA: 1.0000 "application " | TOPICAL_CREAM | Freq: Two times a day (BID) | CUTANEOUS | 0 refills | Status: DC

## 2019-12-16 MED FILL — Heparin Sod (Porcine)-NaCl IV Soln 1000 Unit/500ML-0.9%: INTRAVENOUS | Qty: 500 | Status: AC

## 2019-12-16 NOTE — TOC Progression Note (Signed)
Transition of Care [facility]) - Progression Note    Patient Details  Name: [name] MRN: [id number] Date of Birth: [birth year]  Transition of Care [facility]) CM/SW [place], LCSW Phone Number: 12/16/2019, 1:48 PM  Clinical Narrative:    CSW provided bed offers to patient. [facility] has selected [name] and they will be able to transport patient to [facility] TTS. Required COVID results still pending.    Expected Discharge Plan: [place] Barriers to Discharge: Continued Medical Work up  Expected Discharge Plan and Services Expected Discharge Plan: [place] In-house Referral: Clinical Social Work   Post Acute Care Choice: [place] Living arrangements for the past 2 months: Single Family Home                                       Social Determinants of Health (SDOH) Interventions    Readmission Risk Interventions No flowsheet data found.

## 2019-12-16 NOTE — Progress Notes (Signed)
Renal Navigator spoke with patient to ensure that he is aware of his OP HD clinic schedule, though he will be transported to and from tx by SNF. Patient stated understanding and reports that he will need transportation at discharge from SNF. Navigator sent message to OP HD clinic Social Worker/D. Mitchel Honour to inform that patient will need assistance with transportation at discharge from SNF.   Alphonzo Cruise, [place] Renal Navigator [phone]

## 2019-12-16 NOTE — Progress Notes (Addendum)
NAMEQuinterious [name], MRN:  [id number], DOB:  1956-06-05, LOS: 42 ADMISSION DATE:  11/28/2019   Brief History  64 yo male with T2DM, HTN, left AKA and right partial foot amputation who was admitted to IMTS on 4/29 for purulent cellulitis of RLE and found to have severe combined heart failure and CKD 5.  Subjective/Interm history  No overnight events Nausea improved this morning.   Significant Hospital Events   4/29 hospital admission 4/30 ortho and neph consulted. Sodium bicarb started 5/1 40mg  lasix>1.6L out. Lopressor started. 5/2 transfused 1U PRBC for hgb of 7. Vanc/rocephin/flagyl stopped>transitioned to doxy. flomax started for urinary retention.  5/3 hgb 8.5. renal function continuing to decline 5/4 cr continuing to rise, cardiology consulted--hydral/imdur started, metop>coreg 5/5 no significant changes. Foley removed however was retaining so foley replaced.  5/6 creatinine plateauing. vasc consulted for permanent access. 5/7 continued renal decline. Creatinine now 6.85. lexiscan, vein mapping. Planning to obtain access for HD on Monday 5/8 no significant events 5/9 no significant changes 5/10 HD access placement pushed back to Wednesday 5/11 creatinine up to 7.2. 5/12 AVF/TDC placed 5/13 no significant events 5/14 first HD session went well.  [place]  5/15 Foley removed.  HD. Arranging dispo to SNF 5/16 no significant events 5/17 awaiting SNF placement  Objective   Blood pressure 134/70, pulse 79, temperature 98.8 F (37.1 C), temperature source Oral, resp. rate (!) 25, height 5\' 3"  (1.6 m), weight 72.6 kg, SpO2 99 %.     Intake/Output Summary (Last 24 hours) at 12/16/2019 0544 Last data filed at 12/15/2019 1823 Gross per 24 hour  Intake 360 ml  Output --  Net 360 ml   Filed Weights   12/14/19 1355 12/15/19 0500 12/16/19 0406  Weight: 70.9 kg 70.9 kg 72.6 kg    Examination: GENERAL: in no acute distress CARDIAC: Regular rate and rhythm. PULMONARY: Lungs  clear SKIN: UNA boot to RLE GU: foley  Consults:  Orthopedics-signed off Nephology  Cardiology Vascular surgery Significant Diagnostic Tests:  4/29 R foot MRI>>no evidence of loculated fluid collection or osteomyelitis  4/29 CXR>>b/l effusions with basilar atelectasis vs pna; vascular congestion with diffuse interstitial opacities suspicious for pulm edema  4/30 CT renal study>>no acute abnormalities in abd/pelvis.  4/30 echocardiogram>>Left ventricular ejection fraction, by estimation, is 30 to 35%. The left ventricle has moderately decreased function. The left ventricle demonstrates global hypokinesis. Diffuse hypokinesis worse in the inferior base .  The left ventricular internal cavity size was mildly dilated. There is moderate left ventricular hypertrophy. Left ventricular diastolic parameters are consistent with Grade II diastolic dysfunction (pseudonormalization). Elevated left ventricular end-diastolic pressure. Right ventricular systolic function is moderately reduced. The right ventricular size is mildly enlarged. The mitral valve is normal in structure. Mild mitral valve egurgitation. No evidence of mitral stenosis. The aortic valve is tricuspid. Aortic valve regurgitation is not visualized. Mild aortic valve sclerosis is present, with no evidence of aortic valve stenosis. The inferior vena cava is dilated in size with <50% respiratory variability, suggesting right atrial pressure of 15 mmHg.   5/5 cardiac amyloid scan>not suggestive of amyloid 5/7 lexiscan>large severe apical and inferolateral perfusion defect which is reversible suggestive of possible LCx territory ischemia. LVEF 31% with severe global hypokinesia and inferolateral hypokinesis to akinesis. 5/14 LHC> proximal to mid circumflex 70% stenosis, mid LAD 75%, distal LAD 50% and 90% stenosis, LV end-diastolic pressure moderately elevated, mean pulmonary artery pressure 36, wedge pressure 23, cardiac output 7  Micro Data:   4/29 blood  cultures>>NG 5/2 urine cultures>>NG  Antimicrobials:  Vancomycin 4/29>>5/2 Rocephin 4/29>>5/2 Flagyl 4/29>>5/2 Doxycycline 5/2>>5/8  Summary  64 yo male with T2DM and HTN who was admitted to IMTS on 4/29 for purulent cellulitis of RLE and found to have heart and renal failure.  Renal failure progression to end-stage requiring hemodialysis.  He underwent tunneled dialysis catheter and AV fistula placement on 5/12 and is now on dialysis.  He underwent a left heart cath on 5/14 which showed moderate coronary artery disease.  We are now awaiting HD arrangements for outpatient and SNF placement.  Resolved/Stable hospital problems  Bacturia-received 3d course of vanc/rocephin/flagyl. No growth on urine culture RLE cellulitis. 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 Likely due to poorly controlled DM and HTN along with CHF.  AVF/TDC placed 5/12.  Now undergoing HD per nephrology Outpatient HD seat arranged. Will be on TTS schedule at [facility] clinic. Anemia of CKD Nausea likely 2/2 new HD. A little better today Urinary retention.  Foley removed 5/15.  Did not urinate yesterday however bedside RN bladder scanned him and very minimal volume was appreciated. Suspect that oliguria is 2/2 ESRD progression rather than outlet obstruction Plan: nephrology managing.  --HD per nephrology.  Planning next session for tomorrow 5/18 --Zofran for nausea --Continue renvela.  --ESA per nephrology. --Continue Flomax. 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 EF 30-35% and G2DD.  Moderate Coronary artery disease.  S/p 5/14 LHC Hypertension. Blood pressures stable PAD. As indicated on 5/4 ABI Plan: cardiology managing.  -Continue lipitor, imdur and coreg. Continue tele monitoring, strict I/Os, daily weights.   Anemia of chronic disease.  Likely due to his end-stage renal disease.  Receiving Aranesp per nephrology.  We will continue to intermittently monitor hemoglobin.  Transfuse for hemoglobin less than 7. Thrombocytopenia.  Continue to monitor  T2DM. Only requiring 1U with SSI intermittently. Will d/c sliding scale. PRN CBG   Physical deconditioning.  Continue PT History of L BKA with prosthetic. Attempting to have prosthetic fit evaluated by ortho tech.   Best practice:  CODE STATUS: full Diet: cardiac, renal DVT for prophylaxis: heparin subcut Dispo: medically stable for discharge. Awaiting SNF bed placement. Sleep study and palliative care referral at discharge.     [name], MD INTERNAL MEDICINE RESIDENT PGY-1 PAGER #: [phone] 12/16/19  5:44 AM  Labs    CBC Latest Ref Rng & Units 12/15/2019 12/13/2019 12/13/2019  WBC 4.0 - 10.5 K/uL 8.5 8.6 -  Hemoglobin 13.0 - 17.0 g/dL 8.0(L) 7.7(L) 7.1(L)  Hematocrit 39.0 - 52.0 % 25.7(L) 25.3(L) 21.0(L)  Platelets 150 - 400 K/uL 131(L) 131(L) -   BMP Latest Ref Rng & Units 12/16/2019 12/15/2019 12/14/2019  Glucose 70 - 99 mg/dL 112(H) 113(H) 87  BUN 8 - 23 mg/dL 57(H) 46(H) 83(H)  Creatinine 0.61 - 1.24 mg/dL 5.95(H) 4.71(H) 6.67(H)  Sodium 135 - 145 mmol/L 137 139 140  Potassium 3.5 - 5.1 mmol/L 4.1 4.0 4.8  Chloride 98 - 111 mmol/L 102 102 105  CO2 22 - 32 mmol/L 23 26 20(L)  Calcium 8.9 - 10.3 mg/dL 7.8(L) 8.1(L) 8.1(L)

## 2019-12-16 NOTE — Progress Notes (Signed)
Occupational Therapy Treatment Patient Details Name: [name] MRN: [id number] DOB: May 16, 1956 Today's Date: 12/16/2019    History of present illness 64 year old man with PMH of left BKA and partial right foot amputation, no medical care for at least five years, who presented with cellulitis of the right foot and renal insufficiency. Also found to have bilateral pleural effusions Admitted 11/28/19 for treatment of cellulitis via IV antibiotics and determination of AKIvsCKD. WBAT on R LE   OT comments  [name] was seen today to work on toilet transfers and toileting with RW to [facility]. Pt overall was a min guard A level with being relatively safe except for transfer back to bed--but better than last session. He will continue to benefit from acute OT with follow up at SNF. Paged text to resident about getting [facility] to come and assess fit of LLE prothesis.   Follow Up Recommendations  SNF;Supervision/Assistance - 24 hour    Equipment Recommendations  3 in 1 bedside commode       Precautions / Restrictions Precautions Precautions: Fall Precaution Comments: Left BKA - LLE prosthesis (not fitting for past three sessions per pt, states he needs to be re-fitted)--paged resident to get order to get [facility] to get assess fit of prothesis Restrictions Other Position/Activity Restrictions: WBAT RLE       Mobility Bed Mobility Overal bed mobility: Modified Independent Bed Mobility: Supine to Sit     Supine to sit: Modified independent (Device/Increase time);HOB elevated        Transfers Overall transfer level: Needs assistance Equipment used: Rolling walker (2 wheeled) Transfers: Stand Pivot Transfers;Sit to/from Stand Sit to Stand: Min guard Stand pivot transfers: Min guard            Balance Overall balance assessment: Needs assistance Sitting-balance support: No upper extremity supported;Feet supported Sitting balance-Leahy Scale: Good     Standing balance support: Single  extremity supported;During functional activity Standing balance-Leahy Scale: Fair Standing balance comment: standing for patient to clean his own back peri area                           ADL either performed or assessed with clinical judgement   ADL Overall ADL's : Needs assistance/impaired                         Toilet Transfer: Min guard;Stand-pivot;BSC;RW   Toileting- Clothing Manipulation and Hygiene: Min guard;Sit to/from stand Toileting - Clothing Manipulation Details (indicate cue type and reason): to clean back peri area             Vision Patient Visual Report: No change from baseline            Cognition Arousal/Alertness: Awake/alert Behavior During Therapy: WFL for tasks assessed/performed Overall Cognitive Status: Impaired/Different from baseline Area of Impairment: Safety/judgement                         Safety/Judgement: Decreased awareness of safety     General Comments: pt was safe with transfer from bed to [facility], but on return he was not as "squared up" with bed that would be the best to sit down                   Pertinent Vitals/ Pain       Pain Assessment: No/denies pain         Frequency  Min 2X/week  Progress Toward Goals  OT Goals(current goals can now be found in the care plan section)  Progress towards OT goals: Progressing toward goals     Plan Discharge plan remains appropriate       AM-PAC OT "6 Clicks" Daily Activity     Outcome Measure   Help from another person eating meals?: None Help from another person taking care of personal grooming?: A Little Help from another person toileting, which includes using toliet, bedpan, or urinal?: A Little Help from another person bathing (including washing, rinsing, drying)?: A Little Help from another person to put on and taking off regular upper body clothing?: A Little Help from another person to put on and taking off regular lower body  clothing?: A Lot 6 Click Score: 18    End of Session Equipment Utilized During Treatment: Gait belt;Rolling walker  OT Visit Diagnosis: Unsteadiness on feet (R26.81);Muscle weakness (generalized) (M62.81)   Activity Tolerance Patient tolerated treatment well   Patient Left with bed alarm set(sitting EOB)           Time: 7341-9379 OT Time Calculation (min): 36 min  Charges: OT General Charges $OT Visit: 1 Visit OT Treatments $Self Care/Home Management : 23-37 mins  [place], OTR/L Acute [employer] Pager [phone] Office [phone]      Almon Register 12/16/2019, 10:19 AM

## 2019-12-16 NOTE — TOC Progression Note (Addendum)
Transition of Care [facility]) - Progression Note    Patient Details  Name: [name] MRN: [id number] Date of Birth: January 11, 1956  Transition of Care [facility]) CM/SW [place], LCSW Phone Number: 12/16/2019, 3:39 PM  Clinical Narrative:    3:39pm-RN alerted CSW that patient is not wanting to discharge to SNF today. CSW spoke with patient. He reported that he feels rushed and would like to wait until tomorrow so he can speak with his sister. CSW attempted to call sister but had to leave a voicemail.   CSW received a call from [facility]; they apologize but their planned discharge did not happen and they are unable to accept patient today. CSW asked if they would be able to pick patient up from [facility] tomorrow if we sent him to outpatient dialysis. [name] confirmed that their transprtation will be able to pick the patient up from [facility] at around 2:30pm. CSW will arrange PTAR for hospital pick up at 9:30am to tranport patient to [facility].  5:30pm-Patient's sister contacted CSW back. She reported agreement with discharging patient to [facility] tomorrow morning and is grateful for the information. She will take him clothes after she gets off of work the next day. She also expressed concern that patient may need to stay longer than 20 days. CSW encouraged her to apply for facility Medicaid through [place] and provided her with their contact info as that is the only long term payer. CSW provided her with patient's room number so she can speak with him.     Expected Discharge Plan: [place] Barriers to Discharge: Continued Medical Work up  Expected Discharge Plan and Services Expected Discharge Plan: [place] In-house Referral: Clinical Social Work   Post Acute Care Choice: [place] Living arrangements for the past 2 months: Single Family Home Expected Discharge Date: 12/16/19                                      Social Determinants of Health (SDOH) Interventions    Readmission Risk Interventions No flowsheet data found.

## 2019-12-16 NOTE — Plan of Care (Signed)

## 2019-12-16 NOTE — Progress Notes (Signed)
[facility] Progress Note   New start ESRD  Assessment/ Plan:   1. ESRD:day #3 s/p R IJ Tunneled HD Cath and L BCF, first dialysis yesterday with no issues 1. Tolerated dialysis 5/14 and 5/15.  Planning for TTS schedule given this will be his outpatient chair schedule 2. Plan for next HD  5/18 (Tues).  Tentatively has a spot at [facility] need to coordinate with SNF placement 2. Acute combined systolic and diastolic VZD:GLOVFIEPPIRJJOA with dialysis cardiology following. 3. Cardiomyopathy with EF of 30-35% G2DD:Cardiology consulted 1. Left heart cath with diffuse coronary artery disease. 4. Urgent HTN, improved:on PO Coreg 25mg  BID.  Improved with dialysis 5. Urinary retention:foleyplaced5/5/21 s/p  voiding trial and Foley removed. 6. Anemia of CKD:Hgb today 8,iron stores low,givenFerrlecit, started on ESA -continue to monitor 7. 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  Phosphorus improving with binders and dialysis. Incr Renvela to 2 tabs TIDM on 5/17 -> will recheck phos on Wed. 8. Cellulitis of RLE, improved:UNNA boot in place,s/p Vanc/CTX/flagyl x 3 days, s/pDoxy x10 days 9. PAD:s/p Left BKA  10. 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 Per primary. 11. Anion Gap Metabolic Acidosis: No longer requiring sodium bicarb.  Continue dialysis.  Subjective:   Nausea but denies f/c/sob/CP   Objective:   BP 134/70 (BP Location: Right Arm)   Pulse 77   Temp 98.8 F (37.1 C) (Oral)   Resp (!) 25   Ht 5\' 3"  (1.6 m)   Wt 72.6 kg   SpO2 99%   BMI 28.35 kg/m   Intake/Output Summary (Last 24 hours) at 12/16/2019 4166 Last data filed at 12/15/2019 1823 Gross per 24 hour  Intake 360 ml  Output -  Net 360 ml   Weight change: 0.3 kg  Physical Exam: General: 64 y.o. male in NAD, sitting in bed Cardio: RRR no m/r/g Lungs: CTAB, no wheezing, no rhonchi, no crackles Abdomen: Soft, nondistended Skin: warm and dry Extremities: 1+ edeme  RLE, lt BKA, left arm BCF  with strong bruit   Imaging: No results found.  Labs: BMET Recent Labs  Lab 12/10/19 0352 12/10/19 0352 12/11/19 0407 12/11/19 0407 12/12/19 0630 12/13/19 0248 12/13/19 1453 12/13/19 1509 12/13/19 1752 12/14/19 0808 12/15/19 0612 12/16/19 0325  NA 145   < > 141   < > 142 141 140 141  --  140 139 137  K 4.4   < > 4.5   < > 4.8 4.6 4.2 4.2  --  4.8 4.0 4.1  CL 112*  --  110  --  109 105  --   --   --  105 102 102  CO2 22  --  18*  --  17* 21*  --   --   --  20* 26 23  GLUCOSE 126*  --  108*  --  121* 132*  --   --   --  87 113* 112*  BUN 103*  --  110*  --  113* 115*  --   --   --  83* 46* 57*  CREATININE 7.21*   < > 7.82*  --  8.48* 8.69*  --   --  6.21* 6.67* 4.71* 5.95*  CALCIUM 8.0*  --  7.7*  --  8.0* 7.8*  --   --   --  8.1* 8.1* 7.8*  PHOS 8.1*  --  7.9*  --  8.6* 8.4*  --   --   --  7.9* 6.1* 6.8*   < > =  values in this interval not displayed.   CBC Recent Labs  Lab 12/12/19 0508 12/12/19 0508 12/13/19 0248 12/13/19 0248 12/13/19 1453 12/13/19 1509 12/13/19 1752 12/15/19 0612  WBC 8.4  --  8.7  --   --   --  8.6 8.5  HGB 7.8*   < > 7.4*   < > 7.1* 7.1* 7.7* 8.0*  HCT 25.5*   < > 24.1*   < > 21.0* 21.0* 25.3* 25.7*  MCV 96.6  --  96.4  --   --   --  96.2 98.1  PLT 150  --  144*  --   --   --  131* 131*   < > = values in this interval not displayed.    Medications:    . atorvastatin  20 mg Oral Daily  . carvedilol  25 mg Oral BID WC  . Chlorhexidine Gluconate Cloth  6 each Topical Q0600  . darbepoetin (ARANESP) injection - NON-DIALYSIS  100 mcg Subcutaneous Q Mon-1800  . heparin  5,000 Units Subcutaneous Q8H  . hydrocerin   Topical BID  . insulin aspart  0-5 Units Subcutaneous QHS  . insulin aspart  0-9 Units Subcutaneous TID WC  . isosorbide mononitrate  15 mg Oral Daily  . multivitamin  1 tablet Oral QHS  . sevelamer carbonate  800 mg Oral TID WC  . sodium chloride flush  3 mL Intravenous Once  . sodium chloride flush   3 mL Intravenous Q12H  . sodium chloride flush  3 mL Intravenous Q12H  . tamsulosin  0.4 mg Oral QHS      Otelia Santee, MD 12/16/2019, 8:07 AM

## 2019-12-16 NOTE — Progress Notes (Addendum)
Physical Therapy Treatment Patient Details Name: [name] MRN: [id number] DOB: [birth year] Today's Date: 12/16/2019    History of Present Illness 64 year old man with PMH of left BKA and partial right foot amputation, no medical care for at least five years, who presented with cellulitis of the right foot and renal insufficiency. Also found to have bilateral pleural effusions Admitted 11/28/19 for treatment of cellulitis via IV antibiotics and determination of AKIvsCKD. WBAT on R LE    PT Comments    Pt sitting EOB eating lunch on arrival. Declining transfers OOB due to participating in OT session just prior to arrival. Pt agreeable to LE exercises. He performed LE exercises seated and supine. Pt supine in bed at end of session. Goals reviewed and updated as needed. SNF remains appropriate at d/c due to inaccessible home environment. Pt lives in mobile home and has 7-9 steps to enter, which is not manageable at w/c level. Pt unable to currently wear his LLE prosthesis. OT contacted MD this AM to request [facility] consult.     Follow Up Recommendations  SNF;Supervision/Assistance - 24 hour     Equipment Recommendations  None recommended by PT    Recommendations for Other Services       Precautions / Restrictions Precautions Precautions: Fall Precaution Comments: L BKA at baseline Restrictions Other Position/Activity Restrictions: WBAT RLE    Mobility  Bed Mobility Overal bed mobility: Modified Independent Bed Mobility: Supine to Sit     Supine to sit: Modified independent (Device/Increase time);HOB elevated     General bed mobility comments: +rail  Transfers Overall transfer level: Needs assistance Equipment used: Rolling walker (2 wheeled) Transfers: Stand Pivot Transfers;Sit to/from Stand Sit to Stand: Min guard Stand pivot transfers: Min guard          Ambulation/Gait                 Stairs             Wheelchair Mobility    Modified Rankin  (Stroke Patients Only)       Balance Overall balance assessment: Needs assistance Sitting-balance support: No upper extremity supported;Feet supported Sitting balance-Leahy Scale: Good     Standing balance support: Single extremity supported;During functional activity Standing balance-Leahy Scale: Fair Standing balance comment: standing for patient to clean his own back peri area                            Cognition Arousal/Alertness: Awake/alert Behavior During Therapy: WFL for tasks assessed/performed Overall Cognitive Status: Impaired/Different from baseline Area of Impairment: Safety/judgement                         Safety/Judgement: Decreased awareness of safety     General Comments: pt was safe with transfer from bed to [facility], but on return he was not as "squared up" with bed that would be the best to sit down      Exercises General Exercises - Lower Extremity Ankle Circles/Pumps: AROM;Right;10 reps;Seated Quad Sets: AROM;Both;10 reps;Supine Long Arc Quad: AROM;Right;Left;10 reps;Seated Hip ABduction/ADduction: AROM;Both;10 reps;Seated Straight Leg Raises: AROM;Right;Left;10 reps;Supine Hip Flexion/Marching: AROM;Right;Left;10 reps;Seated    General Comments        Pertinent Vitals/Pain Pain Assessment: No/denies pain    Home Living                      Prior Function  PT Goals (current goals can now be found in the care plan section) Acute Rehab PT Goals Patient Stated Goal: home PT Goal Formulation: With patient Time For Goal Achievement: 12/28/19 Potential to Achieve Goals: Fair Progress towards PT goals: Progressing toward goals    Frequency    Min 2X/week      PT Plan Frequency needs to be updated    Co-evaluation              AM-PAC PT "6 Clicks" Mobility   Outcome Measure  Help needed turning from your back to your side while in a flat bed without using bedrails?: None Help needed  moving from lying on your back to sitting on the side of a flat bed without using bedrails?: A Little Help needed moving to and from a bed to a chair (including a wheelchair)?: A Little Help needed standing up from a chair using your arms (e.g., wheelchair or bedside chair)?: A Little Help needed to walk in hospital room?: A Lot Help needed climbing 3-5 steps with a railing? : A Lot 6 Click Score: 17    End of Session   Activity Tolerance: Patient tolerated treatment well Patient left: in bed;with call bell/phone within reach;with bed alarm set Nurse Communication: Mobility status PT Visit Diagnosis: Unsteadiness on feet (R26.81);Other abnormalities of gait and mobility (R26.89);Muscle weakness (generalized) (M62.81);Difficulty in walking, not elsewhere classified (R26.2)     Time: 1191-4782 PT Time Calculation (min) (ACUTE ONLY): 10 min  Charges:  $Therapeutic Exercise: 8-22 mins                     Lorrin Goodell, PT  Office # [phone] Pager [place], [place] 12/16/2019, 1:38 PM

## 2019-12-17 DIAGNOSIS — Z23 Encounter for immunization: Secondary | ICD-10-CM | POA: Insufficient documentation

## 2019-12-17 LAB — RENAL FUNCTION PANEL
Albumin: 2.4 g/dL — ABNORMAL LOW (ref 3.5–5.0)
Anion gap: 14 (ref 5–15)
BUN: 70 mg/dL — ABNORMAL HIGH (ref 8–23)
CO2: 24 mmol/L (ref 22–32)
Calcium: 7.6 mg/dL — ABNORMAL LOW (ref 8.9–10.3)
Chloride: 99 mmol/L (ref 98–111)
Creatinine, Ser: 6.86 mg/dL — ABNORMAL HIGH (ref 0.61–1.24)
GFR calc Af Amer: 9 mL/min — ABNORMAL LOW (ref >60)
GFR calc non Af Amer: 8 mL/min — ABNORMAL LOW (ref >60)
Glucose, Bld: 134 mg/dL — ABNORMAL HIGH (ref 70–99)
Phosphorus: 7.4 mg/dL — ABNORMAL HIGH (ref 2.5–4.6)
Potassium: 4.6 mmol/L (ref 3.5–5.1)
Sodium: 137 mmol/L (ref 135–145)

## 2019-12-17 LAB — GLUCOSE, CAPILLARY: Glucose-Capillary: 117 mg/dL — ABNORMAL HIGH (ref 70–99)

## 2019-12-17 NOTE — Care Management Important Message (Signed)
Important Message  Patient Details  Name: Mando Blatz MRN: [id number] Date of Birth: Apr 03, 1956   Medicare Important Message Given:  Yes     Shelda Altes 12/17/2019, 9:43 AM

## 2019-12-17 NOTE — TOC Transition Note (Signed)
Transition of Care [facility]) - CM/SW Discharge Note   Patient Details  Name: [name] MRN: [id number] Date of Birth: Oct 16, 1955  Transition of Care [facility]) CM/SW Contact:  [name], [place] Phone Number: 12/17/2019, 8:36 AM   Clinical Narrative:    Patient will DC to: [facility] (first going to [facility] center and [name] will pick him up from there) Anticipated DC date: 5/18 Family notified: Sister, [name] Transport by: [name] 9:30am   Per MD patient ready for DC to [facility] RN, patient, patient's family, and facility notified of DC. Discharge Summary and FL2 sent to facility. RN to call report prior to discharge [phone]). DC packet on chart. Ambulance transport requested for patient.   CSW will sign off for now as social work intervention is no longer needed. Please consult [place] again if new needs arise.      Final next level of care: Skilled Nursing Facility Barriers to Discharge: No Barriers Identified   Patient Goals and CMS Choice Patient states their goals for this hospitalization and ongoing recovery are:: rehab CMS Medicare.gov Compare Post Acute Care list provided to:: Patient Choice offered to / list presented to : Patient  Discharge Placement   Existing PASRR number confirmed : 12/17/19          Patient chooses bed at: [facility] Patient to be transferred to facility by: [name] Name of family member notified: Sister, [name] Patient and family notified of of transfer: 12/17/19  Discharge Plan and Services In-house Referral: Clinical Social Work   Post Acute Care Choice: [place]                               Social Determinants of Health (SDOH) Interventions     Readmission Risk Interventions Readmission Risk Prevention Plan 12/17/2019  Transportation Screening Complete  PCP or Specialist Appt within 3-5 Days Complete  HRI or [place] Complete  Social Work Consult for [place] Planning/Counseling  Complete  Palliative Care Screening Not Applicable  Medication Review [employer] photographer) Complete  Some recent data might be hidden

## 2019-12-17 NOTE — Progress Notes (Signed)
NAMEMisael [name], MRN:  [id number], DOB:  08-06-1955, LOS: 23 ADMISSION DATE:  11/28/2019   Brief History  64 yo male with T2DM, HTN, left AKA and right partial foot amputation who was admitted to IMTS on 4/29 for purulent cellulitis of RLE and found to have severe combined heart failure and CKD 5.  Subjective/Interm history  Pt was supposed to discharge yesterday however SNF called SW noting that bed would not be available until today.  Significant Hospital Events   4/29 hospital admission 4/30 ortho and neph consulted. Sodium bicarb started 5/1 40mg  lasix>1.6L out. Lopressor started. 5/2 transfused 1U PRBC for hgb of 7. Vanc/rocephin/flagyl stopped>transitioned to doxy. flomax started for urinary retention.  5/3 hgb 8.5. renal function continuing to decline 5/4 cr continuing to rise, cardiology consulted--hydral/imdur started, metop>coreg 5/5 no significant changes. Foley removed however was retaining so foley replaced.  5/6 creatinine plateauing. vasc consulted for permanent access. 5/7 continued renal decline. Creatinine now 6.85. lexiscan, vein mapping. Planning to obtain access for HD on Monday 5/8 no significant events 5/9 no significant changes 5/10 HD access placement pushed back to Wednesday 5/11 creatinine up to 7.2. 5/12 AVF/TDC placed 5/13 no significant events 5/14 first HD session went well.  [place]  5/15 Foley removed.  HD. Arranging dispo to SNF 5/16 no significant events 5/17 outpatient HD set up. Bed at SNF available 5/18 discharge  Objective   Blood pressure 126/69, pulse 79, temperature 98.2 F (36.8 C), temperature source Oral, resp. rate (!) 25, height 5\' 3"  (1.6 m), weight 71.7 kg, SpO2 100 %.     Intake/Output Summary (Last 24 hours) at 12/17/2019 0755 Last data filed at 12/16/2019 2130 Gross per 24 hour  Intake 240 ml  Output --  Net 240 ml   Filed Weights   12/15/19 0500 12/16/19 0406 12/17/19 0410  Weight: 70.9 kg 72.6 kg 71.7 kg     Examination: GENERAL: in no acute distress CARDIAC: Regular rate and rhythm. PULMONARY: Lungs clear SKIN: UNA boot to RLE GU: foley  Consults:  Orthopedics-signed off Nephology  Cardiology Vascular surgery Significant Diagnostic Tests:  4/29 R foot MRI>>no evidence of loculated fluid collection or osteomyelitis  4/29 CXR>>b/l effusions with basilar atelectasis vs pna; vascular congestion with diffuse interstitial opacities suspicious for pulm edema  4/30 CT renal study>>no acute abnormalities in abd/pelvis.  4/30 echocardiogram>>Left ventricular ejection fraction, by estimation, is 30 to 35%. The left ventricle has moderately decreased function. The left ventricle demonstrates global hypokinesis. Diffuse hypokinesis worse in the inferior base .  The left ventricular internal cavity size was mildly dilated. There is moderate left ventricular hypertrophy. Left ventricular diastolic parameters are consistent with Grade II diastolic dysfunction (pseudonormalization). Elevated left ventricular end-diastolic pressure. Right ventricular systolic function is moderately reduced. The right ventricular size is mildly enlarged. The mitral valve is normal in structure. Mild mitral valve egurgitation. No evidence of mitral stenosis. The aortic valve is tricuspid. Aortic valve regurgitation is not visualized. Mild aortic valve sclerosis is present, with no evidence of aortic valve stenosis. The inferior vena cava is dilated in size with <50% respiratory variability, suggesting right atrial pressure of 15 mmHg.   5/5 cardiac amyloid scan>not suggestive of amyloid 5/7 lexiscan>large severe apical and inferolateral perfusion defect which is reversible suggestive of possible LCx territory ischemia. LVEF 31% with severe global hypokinesia and inferolateral hypokinesis to akinesis. 5/14 LHC> proximal to mid circumflex 70% stenosis, mid LAD 75%, distal LAD 50% and 90% stenosis, LV end-diastolic pressure  moderately  elevated, mean pulmonary artery pressure 36, wedge pressure 23, cardiac output 7  Micro Data:  4/29 blood cultures>>NG 5/2 urine cultures>>NG  Antimicrobials:  Vancomycin 4/29>>5/2 Rocephin 4/29>>5/2 Flagyl 4/29>>5/2 Doxycycline 5/2>>5/8  Summary  64 yo male with T2DM and HTN who was admitted to IMTS on 4/29 for purulent cellulitis of RLE and found to have heart and renal failure.  Renal failure progression to end-stage requiring hemodialysis.  He underwent tunneled dialysis catheter and AV fistula placement on 5/12 and is now on dialysis.  He underwent a left heart cath on 5/14 which showed moderate coronary artery disease.  We are now awaiting HD arrangements for outpatient and SNF placement.  Resolved/Stable hospital problems  Bacturia-received 3d course of vanc/rocephin/flagyl. No growth on urine culture RLE cellulitis. 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 No changes in plan from yesterday. [facility] to transport pt to HD center at 0930 this morning.  HD dependent ESRD.TTS schedule at [facility] clinic. Anemia of CKD Plan: nephrology managing.  --HD per nephrology.  Planning next session for tomorrow 5/18 --Zofran for nausea --Continue renvela.  --ESA per nephrology. --Continue Flomax.   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 EF 30-35% and G2DD.  Moderate Coronary artery disease.  S/p 5/14 LHC Hypertension. PAD. As indicated on 5/4 ABI Plan:  cardiology managing.  Continue lipitor, imdur and coreg.  Best practice:  CODE STATUS: full Diet: cardiac, renal Dispo: [place], MD INTERNAL MEDICINE RESIDENT PGY-1 PAGER #: [phone] 12/17/19  7:55 AM  Labs    CBC Latest Ref Rng & Units 12/15/2019 12/13/2019 12/13/2019  WBC 4.0 - 10.5 K/uL 8.5 8.6 -  Hemoglobin 13.0 - 17.0 g/dL 8.0(L) 7.7(L) 7.1(L)  Hematocrit 39.0 - 52.0 % 25.7(L) 25.3(L) 21.0(L)  Platelets 150 - 400 K/uL 131(L) 131(L) -   BMP Latest Ref Rng & Units 12/17/2019 12/16/2019 12/15/2019  Glucose 70 - 99 mg/dL 134(H) 112(H) 113(H)  BUN 8 - 23 mg/dL 70(H) 57(H) 46(H)  Creatinine 0.61 - 1.24 mg/dL 6.86(H) 5.95(H) 4.71(H)  Sodium 135 - 145 mmol/L 137 137 139  Potassium 3.5 - 5.1 mmol/L 4.6 4.1 4.0  Chloride 98 - 111 mmol/L 99 102 102  CO2 22 - 32 mmol/L 24 23 26   Calcium 8.9 - 10.3 mg/dL 7.6(L) 7.8(L) 8.1(L)

## 2019-12-17 NOTE — Progress Notes (Signed)
Report attempted at 10:30 to [facility] place. No answer, will try back.

## 2019-12-20 DIAGNOSIS — R770 Abnormality of albumin: Secondary | ICD-10-CM | POA: Insufficient documentation

## 2019-12-20 DIAGNOSIS — Z7901 Long term (current) use of anticoagulants: Secondary | ICD-10-CM | POA: Insufficient documentation

## 2019-12-27 ENCOUNTER — Encounter: Payer: Self-pay | Admitting: Physician Assistant

## 2019-12-27 ENCOUNTER — Other Ambulatory Visit: Payer: Self-pay

## 2019-12-27 ENCOUNTER — Ambulatory Visit (INDEPENDENT_AMBULATORY_CARE_PROVIDER_SITE_OTHER): Payer: Medicare Other | Admitting: Physician Assistant

## 2019-12-27 ENCOUNTER — Telehealth: Payer: Self-pay | Admitting: Physician Assistant

## 2019-12-27 DIAGNOSIS — I872 Venous insufficiency (chronic) (peripheral): Secondary | ICD-10-CM

## 2019-12-27 NOTE — Telephone Encounter (Signed)
Pt in office will address during visit.

## 2019-12-27 NOTE — Progress Notes (Signed)
Office Visit Note   Patient: [name]           Date of Birth: [birth year]           MRN: [id number] Visit Date: 12/27/2019              Requested by: No referring provider defined for this encounter. PCP: Patient, No Pcp Per  Chief Complaint  Patient presents with  . 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 Erlinda Hong.  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  He is here for prescription for new prosthetic.  He uses [employer].  Assessment & Plan: Visit Diagnoses: No diagnosis found.  Plan: Prescription provided for new left below-knee amputation socket and supplies.  The patient may follow-up with [place] as needed Patient is an existing left transtibial  amputee.  Patient's current comorbidities are not expected to impact the ability to function with the prescribed prosthesis. Patient verbally communicates a strong desire to use a prosthesis. Patient currently requires mobility aids to ambulate without a prosthesis.  Expects not to use mobility aids with a new prosthesis.  Patient is a K2 level ambulator that will use a prosthesis to walk around their home and the community over low level environmental barriers.     Follow-Up Instructions: No follow-ups on file.   Ortho Exam  Patient is alert, oriented, no adenopathy, well-dressed, normal affect, normal respiratory effort. Focused examination of his right transmetatarsal amputation stump well-healed no erythema no drainage left below-knee amputation stump has some minor abrasions probably from an ill fitting socket.  No open areas no foul odor mild soft tissue swelling no cellulitis  Imaging: No results found. No images are attached to the encounter.  Labs: Lab Results  Component Value Date   HGBA1C  5.5 11/29/2019   HGBA1C 12.2 (H) 09/02/2013   REPTSTATUS 12/01/2019 FINAL 11/30/2019   GRAMSTAIN  09/03/2013    NO WBC SEEN NO SQUAMOUS EPITHELIAL CELLS SEEN MODERATE GRAM POSITIVE COCCI IN PAIRS Performed at [facility]  09/03/2013    NO WBC SEEN NO SQUAMOUS EPITHELIAL CELLS SEEN MODERATE GRAM POSITIVE COCCI IN PAIRS Performed at [facility]  11/30/2019    NO GROWTH Performed at [facility], [address], [place], [place] [address]    LABORGA PROTEUS MIRABILIS 09/03/2013     Lab Results  Component Value Date   ALBUMIN 2.4 (L) 12/17/2019   ALBUMIN 2.4 (L) 12/16/2019   ALBUMIN 2.4 (L) 12/15/2019    Lab Results  Component Value Date   MG 2.5 09/02/2013   Lab Results  Component Value Date   VD25OH 6.26 (L) 12/03/2019    No results found for: PREALBUMIN CBC EXTENDED Latest Ref Rng & Units 12/15/2019 12/13/2019 12/13/2019  WBC 4.0 - 10.5 K/uL 8.5 8.6 -  RBC 4.22 - 5.81 MIL/uL 2.62(L) 2.63(L) -  HGB 13.0 - 17.0 g/dL 8.0(L) 7.7(L) 7.1(L)  HCT 39.0 - 52.0 % 25.7(L) 25.3(L) 21.0(L)  PLT 150 - 400 K/uL 131(L) 131(L) -  NEUTROABS 1.7 - 7.7 K/uL - - -  LYMPHSABS 0.7 - 4.0 K/uL - - -     There is no height or weight on file to calculate BMI.  Orders:  No  orders of the defined types were placed in this encounter.  No orders of the defined types were placed in this encounter.    Procedures: No procedures performed  Clinical Data: No additional findings.  ROS:  All other systems negative, except as noted in the HPI. Review of Systems  Objective: Vital Signs: There were no vitals taken for this visit.  Specialty Comments:  No specialty comments available.  PMFS History: Patient Active Problem List   Diagnosis Date Noted  . Acute systolic heart failure ([place])   . S/P dialysis catheter insertion ([place])   . ESRD on dialysis ([place]) 12/12/2019  . PAD (peripheral artery disease) ([place]) 12/12/2019  . Pressure injury of  skin 12/01/2019  . Anemia 11/29/2019  . Uncontrolled hypertension 11/29/2019  . CKD (chronic kidney disease), stage V ([place]) 11/29/2019  . Severe protein-calorie malnutrition ([place])   . Diabetic polyneuropathy associated with type 2 diabetes mellitus ([place])   . Venous insufficiency of right lower extremity   . Renal failure 11/28/2019  . Toe gangrene ([place]) 12/18/2013  . Gangrene of toe ([place]) 09/16/2013  . Fever with chills 09/16/2013  . Unilateral complete BKA ([place]) 09/13/2013  . DKA (diabetic ketoacidoses) ([place]) 09/04/2013  . Type 2 diabetes mellitus with diabetic foot infection ([place]) 09/02/2013  . DM (diabetes mellitus) type II controlled, neurological manifestation ([place]) 09/01/2013   Past Medical History:  Diagnosis Date  . Diabetes mellitus without complication (HCC)    PATIENT JUST LEARNED HE WAS DIABETIC  . Hepatitis   . Hypertension   . Hypokalemia   . Hyponatremia   . Necrotizing fasciitis ([place])   . Pneumonia    HX OF PNA    Family History  Problem Relation Age of Onset  . Diabetes type II Mother   . Dementia Mother   . Heart disease Father     Past Surgical History:  Procedure Laterality Date  . AMPUTATION Left 09/11/2013   Procedure: AMPUTATION BELOW KNEE;  Surgeon: Marianna Payment, MD;  Location: [facility];  Service: Orthopedics;  Laterality: Left;  . AMPUTATION Right 12/18/2013   Procedure: RIGHT FOOT 1,2, TOE AMPUTATION  5th toe RAY AMPUTATION;  Surgeon: Marianna Payment, MD;  Location: [facility];  Service: Orthopedics;  Laterality: Right;  . AV FISTULA PLACEMENT Left 12/11/2019   Procedure: LEFT BRACHIOCEPHALIC FISTULA CREATION;  Surgeon: Rosetta Posner, MD;  Location: [place];  Service: Vascular;  Laterality: Left;  . CHOLECYSTECTOMY    . I & D EXTREMITY Left 09/03/2013   Procedure: IRRIGATION AND DEBRIDEMENT EXTREMITY;  Surgeon: Marianna Payment, MD;  Location: [facility];  Service: Orthopedics;  Laterality: Left;  . I & D EXTREMITY Left 09/11/2013   Procedure: LEFT FOOT  IRRIGATION AND DEBRIDEMENT;  Surgeon: Marianna Payment, MD;  Location: [facility];  Service: Orthopedics;  Laterality: Left;  . INSERTION OF DIALYSIS CATHETER  12/11/2019   Procedure: Insertion Of Dialysis Catheter;  Surgeon: Rosetta Posner, MD;  Location: [facility];  Service: Vascular;;  . LEFT HEART CATH AND CORONARY ANGIOGRAPHY N/A 12/13/2019   Procedure: LEFT HEART CATH AND CORONARY ANGIOGRAPHY;  Surgeon: Jettie Booze, MD;  Location: [facility];  Service: Cardiovascular;  Laterality: N/A;  . RIGHT HEART CATH N/A 12/13/2019   Procedure: RIGHT HEART CATH;  Surgeon: Jettie Booze, MD;  Location: [facility];  Service: Cardiovascular;  Laterality: N/A;   Social History   Occupational History  . Not on file  Tobacco Use  . Smoking  status: Never Smoker  . Smokeless tobacco: Never Used  Substance and Sexual Activity  . Alcohol use: No  . Drug use: No  . Sexual activity: Not on file

## 2019-12-27 NOTE — Telephone Encounter (Signed)
[place] from [facility] place called in reference to this patient.  The patient expressed to her that he needs to have his prosthetic leg refitted.  I advised her that we could do an evaluation for a new prosthetic with Dr. Sharol Given.  Asia's CB#[phone].  Thank you.

## 2020-01-10 ENCOUNTER — Encounter: Payer: Self-pay | Admitting: Internal Medicine

## 2020-01-10 ENCOUNTER — Ambulatory Visit (INDEPENDENT_AMBULATORY_CARE_PROVIDER_SITE_OTHER): Payer: Medicare Other | Admitting: Internal Medicine

## 2020-01-10 ENCOUNTER — Other Ambulatory Visit: Payer: Self-pay

## 2020-01-10 VITALS — BP 125/69 | HR 87 | Ht 63.0 in

## 2020-01-10 DIAGNOSIS — I5021 Acute systolic (congestive) heart failure: Secondary | ICD-10-CM

## 2020-01-10 DIAGNOSIS — I739 Peripheral vascular disease, unspecified: Secondary | ICD-10-CM

## 2020-01-10 DIAGNOSIS — Z992 Dependence on renal dialysis: Secondary | ICD-10-CM

## 2020-01-10 DIAGNOSIS — I10 Essential (primary) hypertension: Secondary | ICD-10-CM | POA: Diagnosis not present

## 2020-01-10 DIAGNOSIS — N186 End stage renal disease: Secondary | ICD-10-CM | POA: Diagnosis not present

## 2020-01-10 DIAGNOSIS — N185 Chronic kidney disease, stage 5: Secondary | ICD-10-CM

## 2020-01-10 DIAGNOSIS — D509 Iron deficiency anemia, unspecified: Secondary | ICD-10-CM | POA: Insufficient documentation

## 2020-01-10 DIAGNOSIS — E1142 Type 2 diabetes mellitus with diabetic polyneuropathy: Secondary | ICD-10-CM

## 2020-01-10 NOTE — Patient Instructions (Signed)
Medication Instructions:  No Changes *If you need a refill on your cardiac medications before your next appointment, please call your pharmacy*   Lab Work: None If you have labs (blood work) drawn today and your tests are completely normal, you will receive your results only by: Marland Kitchen MyChart Message (if you have MyChart) OR . A paper copy in the mail If you have any lab test that is abnormal or we need to change your treatment, we will call you to review the results.   Testing/Procedures: [address], [place] has requested that you have an echocardiogram. Echocardiography is a painless test that uses sound waves to create images of your heart. It provides your doctor with information about the size and shape of your heart and how well your heart's chambers and valves are working. This procedure takes approximately one hour. There are no restrictions for this procedure.    Follow-Up: At [facility], you and your health needs are our priority.  As part of our continuing mission to provide you with exceptional heart care, we have created designated Provider Care Teams.  These Care Teams include your primary Cardiologist (physician) and Advanced Practice Providers (APPs -  Physician Assistants and Nurse Practitioners) who all work together to provide you with the care you need, when you need it.  We recommend signing up for the patient portal called "MyChart".  Sign up information is provided on this After Visit Summary.  MyChart is used to connect with patients for Virtual Visits (Telemedicine).  Patients are able to view lab/test results, encounter notes, upcoming appointments, etc.  Non-urgent messages can be sent to your provider as well.   To learn more about what you can do with MyChart, go to [email or web address].    Your next appointment:   1 month(s)  The format for your next appointment:   In Person  Provider:   You may see or one of the  following Advanced Practice Providers on your designated Care Team:    Rosaria Ferries, PA-C  Jory Sims, DNP, ANP  Cadence Kathlen Mody, NP

## 2020-01-10 NOTE — Progress Notes (Signed)
Cardiology Office Note:    Date:  01/10/2020   ID:  [name], DOB 01/14/1956, MRN [id number]  PCP:  Patient, No Pcp Per  Cardiologist:  No primary care provider on file.  Electrophysiologist:  None   Referring MD: No ref. provider found   Chief Complaint: hospital follow up  History of Present Illness:    [name] is a 64 y.o. male with a history of HTN, DM2, peripheral vascular disease, new initiation of IHD for ESRD, and newly diagnosed systolic heart failure, EF 30-35%.    He was recently admitted to the hospital with purulent celluitis of the RLE, and during that hospitalization was noted to have CKD stage 5 newly requiring IHD. 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   Since hospitalization, he is feeling well without chest pain or SOB. Sob improved greatly after initiation of dialysis.   He continues on medical therapy atorvastatin 20 mg daily, carvedilol 25 mg BID, Imdur 15 mg daily.   Heart Failure Therapy ACE-I/ARB/ARNI: none due to renal failure BB: carvedilol 25 mg BID MRA: none with renal failure SGLT2I: none due to GFR  Past Medical History:  Diagnosis Date  . Diabetes mellitus without complication (HCC)    PATIENT JUST LEARNED HE WAS DIABETIC  . Hepatitis   . Hypertension   . Hypokalemia   . Hyponatremia   . Necrotizing fasciitis ([place])   . Pneumonia    HX OF PNA    Past Surgical History:  Procedure Laterality Date  . AMPUTATION Left 09/11/2013   Procedure: AMPUTATION BELOW KNEE;  Surgeon: Marianna Payment, MD;  Location: [facility];  Service: Orthopedics;  Laterality: Left;  . AMPUTATION Right 12/18/2013   Procedure: RIGHT FOOT 1,2, TOE AMPUTATION  5th toe RAY AMPUTATION;  Surgeon: Marianna Payment, MD;  Location: [facility];  Service:  Orthopedics;  Laterality: Right;  . AV FISTULA PLACEMENT Left 12/11/2019   Procedure: LEFT BRACHIOCEPHALIC FISTULA CREATION;  Surgeon: Rosetta Posner, MD;  Location: [facility];  Service: Vascular;  Laterality: Left;  . CHOLECYSTECTOMY    . I & D EXTREMITY Left 09/03/2013   Procedure: IRRIGATION AND DEBRIDEMENT EXTREMITY;  Surgeon: Marianna Payment, MD;  Location: [facility];  Service: Orthopedics;  Laterality: Left;  . I & D EXTREMITY Left 09/11/2013   Procedure: LEFT FOOT IRRIGATION AND DEBRIDEMENT;  Surgeon: Marianna Payment, MD;  Location: [facility];  Service: Orthopedics;  Laterality: Left;  . INSERTION OF DIALYSIS CATHETER  12/11/2019   Procedure: Insertion Of Dialysis Catheter;  Surgeon: Rosetta Posner, MD;  Location: [facility];  Service: Vascular;;  . LEFT HEART CATH AND CORONARY ANGIOGRAPHY N/A 12/13/2019   Procedure: LEFT HEART CATH AND CORONARY ANGIOGRAPHY;  Surgeon: Jettie Booze, MD;  Location: [facility];  Service: Cardiovascular;  Laterality: N/A;  . RIGHT HEART CATH N/A 12/13/2019   Procedure: RIGHT HEART CATH;  Surgeon: Jettie Booze, MD;  Location: [facility];  Service: Cardiovascular;  Laterality: N/A;    Current Medications: Current Meds  Medication Sig  . atorvastatin (LIPITOR) 20 MG tablet Take 1 tablet (20 mg total) by mouth daily.  . carvedilol (COREG) 25 MG tablet Take 1 tablet (25 mg total) by mouth 2 (two) times daily with a meal.  .  hydrocerin (EUCERIN) CREA Apply 1 application topically 2 (two) times daily.  . isosorbide mononitrate (IMDUR) 30 MG 24 hr tablet Take 0.5 tablets (15 mg total) by mouth daily.  . multivitamin (RENA-VIT) TABS tablet Take 1 tablet by mouth at bedtime.  . sevelamer carbonate (RENVELA) 800 MG tablet Take 2 tablets (1,600 mg total) by mouth 3 (three) times daily with meals.  . tamsulosin (FLOMAX) 0.4 MG CAPS capsule Take 1 capsule (0.4 mg total) by mouth at bedtime.     Allergies:   Tramadol   Social History   Socioeconomic  History  . Marital status: Single    Spouse name: Not on file  . Number of children: Not on file  . Years of education: Not on file  . Highest education level: Not on file  Occupational History  . Not on file  Tobacco Use  . Smoking status: Never Smoker  . Smokeless tobacco: Never Used  Vaping Use  . Vaping Use: Never used  Substance and Sexual Activity  . Alcohol use: No  . Drug use: No  . Sexual activity: Not on file  Other Topics Concern  . Not on file  Social History Narrative  . Not on file   Social Determinants of Health   Financial Resource Strain:   . Difficulty of Paying Living Expenses:   Food Insecurity:   . Worried About Charity fundraiser in the Last Year:   . Arboriculturist in the Last Year:   Transportation Needs:   . Film/video editor (Medical):   Marland Kitchen Lack of Transportation (Non-Medical):   Physical Activity:   . Days of Exercise per Week:   . Minutes of Exercise per Session:   Stress:   . Feeling of Stress :   Social Connections:   . Frequency of Communication with Friends and Family:   . Frequency of Social Gatherings with Friends and Family:   . Attends Religious Services:   . Active Member of Clubs or Organizations:   . Attends Archivist Meetings:   Marland Kitchen Marital Status:      Family History: The patient's family history includes Dementia in his mother; Diabetes type II in his mother; Heart disease in his father.  ROS:   Please see the history of present illness.    All other systems reviewed and are negative.  EKGs/Labs/Other Studies Reviewed:    The following studies were reviewed today:   Recent Labs: 11/29/2019: ALT 14 12/15/2019: Hemoglobin 8.0; Platelets 131 12/17/2019: BUN 70; Creatinine, Ser 6.86; Potassium 4.6; Sodium 137  Recent Lipid Panel No results found for: CHOL, TRIG, HDL, CHOLHDL, VLDL, LDLCALC, LDLDIRECT  Physical Exam:    VS:  BP 125/69   Pulse 87   Ht 5\' 3"  (1.6 m)   SpO2 97%   BMI 28.00 kg/m      Wt Readings from Last 5 Encounters:  12/17/19 158 lb 1.1 oz (71.7 kg)  12/18/13 145 lb (65.8 kg)  11/11/13 145 lb (65.8 kg)  10/16/13 145 lb (65.8 kg)  09/02/13 148 lb (67.1 kg)     Constitutional: No acute distress Eyes: sclera non-icteric, normal conjunctiva and lids ENMT: normal dentition, moist mucous membranes Cardiovascular: regular rhythm, normal rate, no murmurs. S1 and S2 normal. Radial pulses normal bilaterally. No jugular venous distention.  Respiratory: clear to auscultation bilaterally GI : normal bowel sounds, soft and nontender. No distention.   MSK: extremities warm, well perfused. No edema.  NEURO: grossly nonfocal exam, moves all  extremities. PSYCH: alert and oriented x 3, normal mood and affect.   ASSESSMENT:    1. Acute systolic heart failure ([place])   2. Hypertension, unspecified type   3. PAD (peripheral artery disease) ([place])   4. ESRD on dialysis ([place])   5. CKD (chronic kidney disease), stage V ([place])   6. 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  - unclear why hydralazine stopped, would continue. BP stable today. - He was not on ASA post cath, likely secondary to low hemoglobin. Would consider repeat CBC at follow up and consider adding ASA 81 mg daily if no contraindication.  - limited ability to add HF therapy due to ESRD.  Total time of encounter: 20 minutes total time of encounter, including 15 minutes spent in face-to-face patient care on the date of this encounter. This time includes coordination of care and counseling regarding above mentioned problem list. Remainder of non-face-to-face time involved reviewing chart documents/testing relevant to the patient encounter and documentation in the medical record. I have independently reviewed documentation from referring provider.    Cherlynn Kaiser, MD [place]  [facility]    Medication Adjustments/Labs and Tests Ordered: Current medicines are reviewed at length with the patient today.  Concerns regarding medicines are outlined above.  Orders Placed This Encounter  Procedures  . ECHOCARDIOGRAM COMPLETE   No orders of the defined types were placed in this encounter.   Patient Instructions  Medication Instructions:  No Changes *If you need a refill on your cardiac medications before your next appointment, please call your pharmacy*   Lab Work: None If you have labs (blood work) drawn today and your tests are completely normal, you will receive your results only by: Marland Kitchen MyChart Message (if you have MyChart) OR . A paper copy in the mail If you have any lab test that is abnormal or we need to change your treatment, we will call you to review the results.   Testing/Procedures: [address], [place] has requested that you have an echocardiogram. Echocardiography is a painless test that uses sound waves to create images of your heart. It provides your doctor with information about the size and shape of your heart and how well your heart's chambers and valves are working. This procedure takes approximately one hour. There are no restrictions for this procedure.    Follow-Up: At [facility], you and your health needs are our priority.  As part of our continuing mission to provide you with exceptional heart care, we have created designated Provider Care Teams.  These Care Teams include your primary Cardiologist (physician) and Advanced Practice Providers (APPs -  Physician Assistants and Nurse Practitioners) who all work together to provide you with the care you need, when you need it.  We recommend signing up for the patient portal called "MyChart".  Sign up information is provided on this After Visit Summary.  MyChart is used to connect with patients for Virtual Visits  (Telemedicine).  Patients are able to view lab/test results, encounter notes, upcoming appointments, etc.  Non-urgent messages can be sent to your provider as well.   To learn more about what you can do with MyChart, go to [email or web address].    Your next appointment:   1 month(s)  The format for your next appointment:   In Person  Provider:   You may see or one of the following Advanced Practice Providers on your designated Care Team:    Rosaria Ferries, PA-C  Jory Sims, DNP, ANP  Cadence Kathlen Mody, NP

## 2020-01-13 ENCOUNTER — Encounter (HOSPITAL_COMMUNITY): Payer: Self-pay | Admitting: Interventional Cardiology

## 2020-01-14 ENCOUNTER — Other Ambulatory Visit: Payer: Self-pay | Admitting: *Deleted

## 2020-01-14 DIAGNOSIS — Z992 Dependence on renal dialysis: Secondary | ICD-10-CM

## 2020-01-21 ENCOUNTER — Encounter (HOSPITAL_COMMUNITY): Payer: Medicare Other

## 2020-01-22 ENCOUNTER — Ambulatory Visit (HOSPITAL_COMMUNITY)
Admission: RE | Admit: 2020-01-22 | Discharge: 2020-01-22 | Disposition: A | Discharge status: Home / Self Care | Payer: No Typology Code available for payment source | Source: Ambulatory Visit | Attending: Vascular Surgery | Admitting: Vascular Surgery

## 2020-01-22 ENCOUNTER — Other Ambulatory Visit: Payer: Self-pay

## 2020-01-22 ENCOUNTER — Ambulatory Visit (INDEPENDENT_AMBULATORY_CARE_PROVIDER_SITE_OTHER): Payer: Self-pay | Admitting: Physician Assistant

## 2020-01-22 VITALS — BP 137/73 | HR 68 | Temp 97.3°F | Resp 20 | Ht 63.0 in

## 2020-01-22 DIAGNOSIS — Z992 Dependence on renal dialysis: Secondary | ICD-10-CM | POA: Diagnosis present

## 2020-01-22 DIAGNOSIS — N186 End stage renal disease: Secondary | ICD-10-CM | POA: Insufficient documentation

## 2020-01-22 NOTE — Progress Notes (Signed)
POST OPERATIVE OFFICE NOTE    CC:  F/u for surgery  HPI:  This is a 64 y.o. male who is s/p left BC AVF and insertion of TDC (right IJ) on 12/11/2019 by Dr. Donnetta Hutching.    Pt states he does some have numbness in his left forearm and 3rd, 4th and 5th fingers.  He denies pain in his left hand.  He states he can use his hand.  He does not have left hand clumsiness.  He does not have any wounds on his left hand.    The pt is on dialysis T/T/S at the [facility] location.   Allergies  Allergen Reactions  . Tramadol Nausea And Vomiting    Pt states he took this on an empty stomach one time and it came right back up. He's not had it since.     Current Outpatient Medications  Medication Sig Dispense Refill  . atorvastatin (LIPITOR) 20 MG tablet Take 1 tablet (20 mg total) by mouth daily.    . carvedilol (COREG) 25 MG tablet Take 1 tablet (25 mg total) by mouth 2 (two) times daily with a meal.    . hydrocerin (EUCERIN) CREA Apply 1 application topically 2 (two) times daily.  0  . isosorbide mononitrate (IMDUR) 30 MG 24 hr tablet Take 0.5 tablets (15 mg total) by mouth daily.    . multivitamin (RENA-VIT) TABS tablet Take 1 tablet by mouth at bedtime.  0  . sevelamer carbonate (RENVELA) 800 MG tablet Take 2 tablets (1,600 mg total) by mouth 3 (three) times daily with meals.    . tamsulosin (FLOMAX) 0.4 MG CAPS capsule Take 1 capsule (0.4 mg total) by mouth at bedtime. 30 capsule    No current facility-administered medications for this visit.     ROS:  See HPI  Physical Exam:  Today's Vitals   01/22/20 1436  BP: 137/73  Pulse: 68  Resp: 20  Temp: (!) 97.3 F (36.3 C)  TempSrc: Temporal  SpO2: 98%  Height: 5\' 3"  (1.6 m)  PainSc: 0-No pain   Body mass index is 28 kg/m.   Incision:  Healed nicely Extremities:  There is not a palpable left radial pulse.  Motor and sensory are in tact.  There is an excellent thrill and it is easily palpable.  His hand grips are 5/5 and strong & equal  bilaterally  Dialysis Duplex on 01/22/2020: Diameter:  0.82cm-1.02cm Depth:  0.21cm-0.32cm  Left radial artery is calcified peak systolic velocity increases from 14  cm/sec to 93 cm/sec with AVF compression. Left second digit pressure increases from 7 mm HG to 144 mm Hg with AVF compression. Right brachial pressure is 150 mm Hg.    Assessment/Plan:  This is a 64 y.o. male who is s/p: left BC AVF and insertion of TDC (right IJ) on 12/11/2019 by Dr. Donnetta Hutching.   -the fistula is maturing nicely and can be used 03/12/2020.  He does have some numbness in his 3rd, 4th and 5th fingers as well as his forearm.  He most likely does have some steal but also some nerve irritation.  I discussed at length with pt about steal.  He is not experiencing any clumsiness.  He does not have any non healing wounds.  He does not have pain.  Reiterated to pt several times should he develop any wounds that won't heal on his left hand or clumsiness of the left hand or any motor deficits, he should return to see [place] as soon as  possible.  He states that it is tolerable at this point.   -pt has tunneled dialysis catheter and when the access has been used successfully to the satisfaction of the dialysis center, the tunneled catheter can be removed at their discretion.   -the pt will follow up as needed.    Leontine Locket, [facility] [phone]  Clinic MD:  Scot Dock

## 2020-03-11 ENCOUNTER — Telehealth: Payer: Self-pay

## 2020-03-11 NOTE — Telephone Encounter (Signed)
Called patient- moved appointment from 11:45 to 11:15- per Callie's request to hold the 11:45 due to a meeting that she has to attend.   Left call back number for patient to notify once she has received the call and approved time.

## 2020-04-07 NOTE — Progress Notes (Signed)
Cardiology Office Note:    Date:  04/15/2020   ID:  [name], DOB 08-22-1955, MRN [id number]  PCP:  Patient, No Pcp Per  Cardiologist:  Elouise Munroe, MD  Electrophysiologist:  None   Referring MD: No ref. provider found   Chief Complaint: follow-up of CHF  History of Present Illness:    [name] is a 64 y.o. male with a history of moderate  CAD on cath in 11/2019 (medical therapy recommended), chronic combined CHF with EF as low as 30-35% in 10/2019 but improved to 50-55% in 04/2020, PVD, hypertension, type 2 diabetes mellitus, and ESRD on hemodialysis T/Th/S who is followed by Dr. Margaretann Loveless and presents today for routine follow-up.  Patient admitted in 11/2019 with purulent cellulitis of right lower extremity. He was also noted to be grossly volume overloaded and Echo showed LVEF of 30-35% with grade 2 diastolic dysfunction. Also found to have CKD stage V and patient was started on hemodialysis. Also required blood transfusion due to hemoglobin of 7.1 due to anemia from of CKD. After dialysis was established, he underwent left heart catheterization which showed moderate multivessel CAD including 50% stenosis of mid to distal left main. Cardiomyopathy seemed to be out of proportion to CAD but felt to possibly be a component of LV dysfunction. LVEDP was elevated. Medical therapy was recommended. He was discharged on Coreg 25mg  twice daily, Imdur 15mg  daily, and Lipitor 20mg  daily.  Patient was seen by Dr. Margaretann Loveless for follow-up in 12/2019 at which time he was feeling well. Shortness of breath had greatly improved with dialysis and he denied any chest pain. Repeat Echo on 04/10/2020 showed improved LVEF of 50-55% with normal wall motion and grade 2 diastolic dysfunction but there was still evidence of high left heart filling pressures (although improved from 10/2019).   Patient presents today for follow-up. He is here alone. He appears very disheveled and clothes are very dirty. He states he  has been doing well since his last visit. He endorses very short brief episodes of rest discomfort at rest that he describes as "mini lighting" and states only last for 2-3 seconds before resolving on its own. Nothing that sounds like angina. No shortness of breath, orthopnea, PND, lower extremity edema, palpitations, lightheadedness, dizziness, nears syncope, syncope. He denies abnormal bleeding in urine or stools. He does note constant diarrhea but it does not sound like this is new.  BP elevated in office today at 164/78 (168/68 on my recheck). However, BP log from 03/2020 showed BP is normally much better controlled (129-139/70-82). He does not report BP dropping significantly on dialysis days.  Past Medical History:  Diagnosis Date  . Diabetes mellitus without complication (HCC)    PATIENT JUST LEARNED HE WAS DIABETIC  . Hepatitis   . Hypertension   . Hypokalemia   . Hyponatremia   . Necrotizing fasciitis ([place])   . Pneumonia    HX OF PNA    Past Surgical History:  Procedure Laterality Date  . AMPUTATION Left 09/11/2013   Procedure: AMPUTATION BELOW KNEE;  Surgeon: Marianna Payment, MD;  Location: [facility];  Service: Orthopedics;  Laterality: Left;  . AMPUTATION Right 12/18/2013   Procedure: RIGHT FOOT 1,2, TOE AMPUTATION  5th toe RAY AMPUTATION;  Surgeon: Marianna Payment, MD;  Location: [facility];  Service: Orthopedics;  Laterality: Right;  . AV FISTULA PLACEMENT Left 12/11/2019   Procedure: LEFT BRACHIOCEPHALIC FISTULA CREATION;  Surgeon: Rosetta Posner, MD;  Location: [facility];  Service: Vascular;  Laterality: Left;  . CHOLECYSTECTOMY    . I & D EXTREMITY Left 09/03/2013   Procedure: IRRIGATION AND DEBRIDEMENT EXTREMITY;  Surgeon: Marianna Payment, MD;  Location: [facility];  Service: Orthopedics;  Laterality: Left;  . I & D EXTREMITY Left 09/11/2013   Procedure: LEFT FOOT IRRIGATION AND DEBRIDEMENT;  Surgeon: Marianna Payment, MD;  Location: [facility];  Service: Orthopedics;  Laterality: Left;  .  INSERTION OF DIALYSIS CATHETER  12/11/2019   Procedure: Insertion Of Dialysis Catheter;  Surgeon: Rosetta Posner, MD;  Location: [facility];  Service: Vascular;;  . LEFT HEART CATH AND CORONARY ANGIOGRAPHY N/A 12/13/2019   Procedure: LEFT HEART CATH AND CORONARY ANGIOGRAPHY;  Surgeon: Jettie Booze, MD;  Location: [facility];  Service: Cardiovascular;  Laterality: N/A;  . RIGHT HEART CATH N/A 12/13/2019   Procedure: RIGHT HEART CATH;  Surgeon: Jettie Booze, MD;  Location: [facility];  Service: Cardiovascular;  Laterality: N/A;    Current Medications: Current Meds  Medication Sig  . atorvastatin (LIPITOR) 20 MG tablet Take 1 tablet (20 mg total) by mouth daily.  . carvedilol (COREG) 25 MG tablet Take 1 tablet (25 mg total) by mouth 2 (two) times daily with a meal.  . hydrocerin (EUCERIN) CREA Apply 1 application topically 2 (two) times daily.  . isosorbide mononitrate (IMDUR) 30 MG 24 hr tablet Take 0.5 tablets (15 mg total) by mouth daily.  . multivitamin (RENA-VIT) TABS tablet Take 1 tablet by mouth at bedtime.  . sevelamer carbonate (RENVELA) 800 MG tablet Take 2 tablets (1,600 mg total) by mouth 3 (three) times daily with meals.  . tamsulosin (FLOMAX) 0.4 MG CAPS capsule Take 1 capsule (0.4 mg total) by mouth at bedtime.     Allergies:   Tramadol   Social History   Socioeconomic History  . Marital status: Single    Spouse name: Not on file  . Number of children: Not on file  . Years of education: Not on file  . Highest education level: Not on file  Occupational History  . Not on file  Tobacco Use  . Smoking status: Never Smoker  . Smokeless tobacco: Never Used  Vaping Use  . Vaping Use: Never used  Substance and Sexual Activity  . Alcohol use: No  . Drug use: No  . Sexual activity: Not on file  Other Topics Concern  . Not on file  Social History Narrative  . Not on file   Social Determinants of Health   Financial Resource Strain:   .  Difficulty of Paying Living Expenses: Not on file  Food Insecurity:   . Worried About Charity fundraiser in the Last Year: Not on file  . Ran Out of Food in the Last Year: Not on file  Transportation Needs:   . Lack of Transportation (Medical): Not on file  . Lack of Transportation (Non-Medical): Not on file  Physical Activity:   . Days of Exercise per Week: Not on file  . Minutes of Exercise per Session: Not on file  Stress:   . Feeling of Stress : Not on file  Social Connections:   . Frequency of Communication with Friends and Family: Not on file  . Frequency of Social Gatherings with Friends and Family: Not on file  . Attends Religious Services: Not on file  . Active Member of Clubs or Organizations: Not on file  . Attends Archivist Meetings: Not on file  . Marital Status:  Not on file     Family History: The patient's family history includes Dementia in his mother; Diabetes type II in his mother; Heart disease in his father.  ROS:   Please see the history of present illness.      EKGs/Labs/Other Studies Reviewed:    The following studies were reviewed today:  Left Heart Catheterization 12/13/2019:  RPDA lesion is 50% stenosed.  Mid LM to Dist LM lesion is 50% stenosed.  Prox Cx to Mid Cx lesion is 100% stenosed.  Mid LAD lesion is 75% stenosed.  Dist LAD-1 lesion is 50% stenosed.  Dist LAD-2 lesion is 90% stenosed.  LV end diastolic pressure is moderately elevated.  There is no aortic valve stenosis.  Diffuse CAD. He denied angina. He has had DOE.  Hemodynamic findings consistent with moderate pulmonary hypertension.  Ao sat 95%, PA sat 62%, mean PA pressure 36 mm Hg; mean PCWP 23 mm Hg; CO 7.0 L/min; CI 4   Moderate CAD.  Cardiomyopathy seems out of proportion to CAD, but ischemia may be a component of the LV dysfunction.    Elevated LVEDP.  Patient lying on a wedge due to shortness of breath.  Continue medical therapy for LV dysfunction.     He did not want any family notified.    RPDA lesion is 50% stenosed.  Mid LM to Dist LM lesion is 50% stenosed.  Prox Cx to Mid Cx lesion is 100% stenosed.  Mid LAD lesion is 75% stenosed.  Dist LAD-1 lesion is 50% stenosed.  Dist LAD-2 lesion is 90% stenosed.  LV end diastolic pressure is moderately elevated.  There is no aortic valve stenosis.  Diffuse CAD. He denied angina. He has had DOE.  Hemodynamic findings consistent with moderate pulmonary hypertension.  Ao sat 95%, PA sat 62%, mean PA pressure 36 mm Hg; mean PCWP 23 mm Hg; CO 7.0 L/min; CI 4   Moderate CAD.  Cardiomyopathy seems out of proportion to CAD, but ischemia may be a component of the LV dysfunction.    Elevated LVEDP.  Patient lying on a wedge due to shortness of breath.  Continue medical therapy for LV dysfunction.    He did not want any family notified.   Diagnostic Dominance: Right  _______________  Echocardiogram 04/10/2020: Impressions: 1. Left ventricular ejection fraction, by estimation, is 50 to 55%. The  left ventricle has low normal function. The left ventricle has no regional  wall motion abnormalities. Left ventricular diastolic parameters are  consistent with Grade II diastolic  dysfunction (pseudonormalization). Elevated left atrial pressure.  2. Right ventricular systolic function is normal. The right ventricular  size is normal.  3. The mitral valve is normal in structure. Trivial mitral valve  regurgitation. No evidence of mitral stenosis.  4. The aortic valve is normal in structure. Aortic valve regurgitation is  not visualized. No aortic stenosis is present.  5. The inferior vena cava is normal in size with greater than 50%  respiratory variability, suggesting right atrial pressure of 3 mmHg.   Comparison(s): Prior images reviewed side by side. The left ventricular function has improved. The left ventricular diastolic function has improved. Although there is still  evidence of high left heart filling pressures, the E/A and E/e' ratios are lower compared to April 2021.   EKG:  EKG not ordered today.   Recent Labs: 11/29/2019: ALT 14 12/15/2019: Hemoglobin 8.0; Platelets 131 12/17/2019: BUN 70; Creatinine, Ser 6.86; Potassium 4.6; Sodium 137  Recent Lipid Panel No results found  for: CHOL, TRIG, HDL, CHOLHDL, VLDL, LDLCALC, LDLDIRECT  Physical Exam:    Vital Signs: BP (!) 164/78   Pulse 82   Ht 5\' 3"  (1.6 m)   Wt 140 lb (63.5 kg)   SpO2 98%   BMI 24.80 kg/m     Wt Readings from Last 3 Encounters:  04/15/20 140 lb (63.5 kg)  12/17/19 158 lb 1.1 oz (71.7 kg)  12/18/13 145 lb (65.8 kg)     General: 64 y.o. male in no acute distress. HEENT: Normocephalic and atraumatic. Sclera clear.  Neck: Supple. No JVD. Heart: RRR. Distinct S1 and S2. No murmurs, gallops, or rubs.  Lungs: No increased work of breathing. Clear to ausculation bilaterally. No wheezes, rhonchi, or rales.  Abdomen: Soft, non-distended, and non-tender to palpation. Bowel sounds present. Extremities: No lower extremity edema.  Left upper arm AV fistula with good thrill and bruit. 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 Skin: Warm and dry. Hyperpigmentation note on anterior aspect of right lower extremity. Neuro: No focal deficits. Psych: Normal affect. Responds appropriately.   Assessment:    1. Chronic combined systolic and diastolic heart failure ([place])   2. Coronary artery disease involving native coronary artery of native heart without angina pectoris   3. PVD (peripheral vascular disease) ([place])   4. Essential hypertension   5. ESRD (end stage renal disease) ([place])   6. Type 2 diabetes mellitus without complication, without long-term current use of insulin (HCC)     Plan:    Chronic Combined CHF - Recent Echo on 04/10/2020 showed LVEF of 50-55% (improved from 30-35%) and grade 2 diastolic dysfunction.  - Appears euvolemic on exam. - Volume status managed via  dialysis.  - Continue Coreg 25mg  twice daily.  - Continue Imdur 15mg  daily. - Will add Hydralazine 10mg  three times daily. - No ACEi/ARB/ARNI due to ESRD.  CAD - Cath in 11/2019 showed moderate CAD including 50% stenosis of mid to distal left main and occluded CX. Medical therapy recommended.  - Stable. He describes atypical chest discomfort that does not sound cardiac in nature (occurs at rest and last for 2-3 seconds at a time) but nothing that sounds like angina.  - He is not on Aspirin. Will recheck CBC today and if hemoglobin is stable, will likely add low dose Aspirin. - Continue Lipitor 20mg  daily. I do not see a lipid panel in our system or [facility]. Patient does not think his cholesterol has been checked in a while. Patient is not fasting this morning so we will try see if we can have cholesterol checked at the facility where he lives.   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 - Continue statin. Will likely add Aspirin if hemoglobin stable.  Hypertension - BP elevated at 164/78 today. On my recheck, it was 168/68. However, reviewed BP log from last month and BP was much better controlled (129-138/70-82). - Given history of reduced EF with inability to add ACE/ARB/ARNI or MRA due to renal function, will add Hydralazine 10mg  three times daily.  - Continue Imdur 15mg  daily.  - If BP drops too low with dialysis, may need to hold Hydralazine on dialysis days or stop completely.  Type 2 Diabetes Mellitus  - Hemoglobin A1c 5.5 in 10/2019. - Management per primary team.  ESRD - On hemodialysis T/Th/S. - Followed by Nephrology.  Disposition: Follow up in 6 months with Dr. Margaretann Loveless.   Medication Adjustments/Labs and Tests Ordered: Current medicines are reviewed at  length with the patient today.  Concerns regarding medicines are outlined above.  Orders Placed This Encounter  Procedures  . CBC   Meds ordered this encounter  Medications  . hydrALAZINE (APRESOLINE) 10 MG  tablet    Sig: Take 1 tablet (10 mg total) by mouth 3 (three) times daily.    Dispense:  90 tablet    Refill:  6    Patient Instructions  Medication Instructions:  START- Hydralazine 10 mg by mouth three times a day  *If you need a refill on your cardiac medications before your next appointment, please call your pharmacy*   Lab Work: CBC Today  If you have labs (blood work) drawn today and your tests are completely normal, you will receive your results only by: [facility] MyChart Message (if you have MyChart) OR . A paper copy in the mail If you have any lab test that is abnormal or we need to change your treatment, we will call you to review the results.   Testing/Procedures: None Ordered   Follow-Up: At [facility], you and your health needs are our priority.  As part of our continuing mission to provide you with exceptional heart care, we have created designated Provider Care Teams.  These Care Teams include your primary Cardiologist (physician) and Advanced Practice Providers (APPs -  Physician Assistants and Nurse Practitioners) who all work together to provide you with the care you need, when you need it.  We recommend signing up for the patient portal called "MyChart".  Sign up information is provided on this After Visit Summary.  MyChart is used to connect with patients for Virtual Visits (Telemedicine).  Patients are able to view lab/test results, encounter notes, upcoming appointments, etc.  Non-urgent messages can be sent to your provider as well.   To learn more about what you can do with MyChart, go to [email or web address].    Your next appointment:   6 month(s)  The format for your next appointment:   In Person  Provider:   You may see Elouise Munroe, MD or one of the following Advanced Practice Providers on your designated Care Team:    Rosaria Ferries, PA-C  Jory Sims, DNP, ANP  Cadence Kathlen Mody, PA-C    Other Instructions Keep a daily blood  pressure check and heart rate for the next 2 weeks     Signed, Eppie Gibson  04/15/2020 11:57 AM    [place]

## 2020-04-10 ENCOUNTER — Other Ambulatory Visit: Payer: Self-pay

## 2020-04-10 ENCOUNTER — Ambulatory Visit (HOSPITAL_COMMUNITY): Payer: Medicare Other | Attending: Cardiovascular Disease

## 2020-04-10 DIAGNOSIS — I5021 Acute systolic (congestive) heart failure: Secondary | ICD-10-CM | POA: Diagnosis not present

## 2020-04-10 LAB — ECHOCARDIOGRAM COMPLETE
Area-P 1/2: 3.21 cm2
Calc EF: 56.1 %
S' Lateral: 3 cm
Single Plane A2C EF: 55.3 %
Single Plane A4C EF: 58.1 %

## 2020-04-15 ENCOUNTER — Ambulatory Visit (INDEPENDENT_AMBULATORY_CARE_PROVIDER_SITE_OTHER): Payer: Medicare Other | Admitting: Student

## 2020-04-15 ENCOUNTER — Other Ambulatory Visit: Payer: Self-pay

## 2020-04-15 ENCOUNTER — Encounter: Payer: Self-pay | Admitting: Student

## 2020-04-15 VITALS — BP 164/78 | HR 82 | Ht 63.0 in | Wt 140.0 lb

## 2020-04-15 DIAGNOSIS — I2510 Atherosclerotic heart disease of native coronary artery without angina pectoris: Secondary | ICD-10-CM | POA: Diagnosis not present

## 2020-04-15 DIAGNOSIS — E119 Type 2 diabetes mellitus without complications: Secondary | ICD-10-CM

## 2020-04-15 DIAGNOSIS — I5042 Chronic combined systolic (congestive) and diastolic (congestive) heart failure: Secondary | ICD-10-CM | POA: Diagnosis not present

## 2020-04-15 DIAGNOSIS — N186 End stage renal disease: Secondary | ICD-10-CM

## 2020-04-15 DIAGNOSIS — I739 Peripheral vascular disease, unspecified: Secondary | ICD-10-CM | POA: Diagnosis not present

## 2020-04-15 DIAGNOSIS — I10 Essential (primary) hypertension: Secondary | ICD-10-CM

## 2020-04-15 LAB — CBC
Hematocrit: 31.2 % — ABNORMAL LOW (ref 37.5–51.0)
Hemoglobin: 10.4 g/dL — ABNORMAL LOW (ref 13.0–17.7)
MCH: 29.9 pg (ref 26.6–33.0)
MCHC: 33.3 g/dL (ref 31.5–35.7)
MCV: 90 fL (ref 79–97)
Platelets: 194 10*3/uL (ref 150–450)
RBC: 3.48 x10E6/uL — ABNORMAL LOW (ref 4.14–5.80)
RDW: 15.4 % (ref 11.6–15.4)
WBC: 10.7 10*3/uL (ref 3.4–10.8)

## 2020-04-15 MED ORDER — HYDRALAZINE HCL 10 MG PO TABS
10.0000 mg | ORAL_TABLET | Freq: Three times a day (TID) | ORAL | 6 refills | Status: DC
Start: 2020-04-15 — End: 2022-07-08

## 2020-04-15 NOTE — Patient Instructions (Signed)
Medication Instructions:  START- Hydralazine 10 mg by mouth three times a day  *If you need a refill on your cardiac medications before your next appointment, please call your pharmacy*   Lab Work: CBC Today  If you have labs (blood work) drawn today and your tests are completely normal, you will receive your results only by:  [place] (if you have MyChart) OR  A paper copy in the mail If you have any lab test that is abnormal or we need to change your treatment, we will call you to review the results.   Testing/Procedures: None Ordered   Follow-Up: At [facility], you and your health needs are our priority.  As part of our continuing mission to provide you with exceptional heart care, we have created designated Provider Care Teams.  These Care Teams include your primary Cardiologist (physician) and Advanced Practice Providers (APPs -  Physician Assistants and Nurse Practitioners) who all work together to provide you with the care you need, when you need it.  We recommend signing up for the patient portal called "MyChart".  Sign up information is provided on this After Visit Summary.  MyChart is used to connect with patients for Virtual Visits (Telemedicine).  Patients are able to view lab/test results, encounter notes, upcoming appointments, etc.  Non-urgent messages can be sent to your provider as well.   To learn more about what you can do with MyChart, go to [email or web address].    Your next appointment:   6 month(s)  The format for your next appointment:   In Person  Provider:   You may see Elouise Munroe, MD or one of the following Advanced Practice Providers on your designated Care Team:    Rosaria Ferries, PA-C  Jory Sims, DNP, ANP  Cadence Kathlen Mody, PA-C    Other Instructions Keep a daily blood pressure check and heart rate for the next 2 weeks

## 2020-05-04 ENCOUNTER — Other Ambulatory Visit: Payer: Self-pay

## 2020-05-04 ENCOUNTER — Telehealth: Payer: Self-pay | Admitting: Student

## 2020-05-04 DIAGNOSIS — I2510 Atherosclerotic heart disease of native coronary artery without angina pectoris: Secondary | ICD-10-CM

## 2020-05-04 NOTE — Telephone Encounter (Signed)
° ° ° °  Doris called back, she said pt is currently living at [facility] and right now they are in lock down due to several of the residence have covid. She said right now there's no way she can get the message send there.

## 2020-05-04 NOTE — Telephone Encounter (Signed)
Spoke with patient's sister who reports patient is in [facility] [place]. She wanted to notify us of the facility so that patient's nurse can be contacted about lab work.   Spoke with Clovis Pu at [facility]. She will start 81mg  of Aspirin daily. She will also order an LFT and lipid panel.   No further questions at this time.

## 2020-08-10 DIAGNOSIS — U071 COVID-19: Secondary | ICD-10-CM | POA: Insufficient documentation

## 2020-11-06 DIAGNOSIS — E8770 Fluid overload, unspecified: Secondary | ICD-10-CM | POA: Insufficient documentation

## 2021-02-09 DIAGNOSIS — N2581 Secondary hyperparathyroidism of renal origin: Secondary | ICD-10-CM | POA: Insufficient documentation

## 2021-08-16 ENCOUNTER — Encounter: Payer: Self-pay | Admitting: Podiatry

## 2021-08-16 ENCOUNTER — Ambulatory Visit (INDEPENDENT_AMBULATORY_CARE_PROVIDER_SITE_OTHER): Payer: Medicare Other

## 2021-08-16 ENCOUNTER — Ambulatory Visit (INDEPENDENT_AMBULATORY_CARE_PROVIDER_SITE_OTHER): Payer: Medicare Other | Admitting: Podiatry

## 2021-08-16 ENCOUNTER — Other Ambulatory Visit: Payer: Self-pay | Admitting: Podiatry

## 2021-08-16 ENCOUNTER — Other Ambulatory Visit: Payer: Self-pay

## 2021-08-16 DIAGNOSIS — E08621 Diabetes mellitus due to underlying condition with foot ulcer: Secondary | ICD-10-CM

## 2021-08-16 DIAGNOSIS — L97412 Non-pressure chronic ulcer of right heel and midfoot with fat layer exposed: Secondary | ICD-10-CM | POA: Diagnosis not present

## 2021-08-16 DIAGNOSIS — E1142 Type 2 diabetes mellitus with diabetic polyneuropathy: Secondary | ICD-10-CM

## 2021-08-16 DIAGNOSIS — M79671 Pain in right foot: Secondary | ICD-10-CM

## 2021-08-16 NOTE — Progress Notes (Signed)
°  Subjective:  Patient ID: [name], male    DOB: [birth year],   MRN: [id number]  Chief Complaint  Patient presents with   Foot Problem    Pt is here due to an area on bottom of right foot. No pain,swelling    66 y.o. male presents for concern of calluses area on the bottom of right foot. Was sent here by dialysis. Is currently in a facility and they have been taking care of the area in the past. He is diabetic and has a history of left BKA and right foot toe amputations.  . Denies any other pedal complaints. Denies n/v/f/c.    Past Medical History:  Diagnosis Date   Diabetes mellitus without complication ([place])    PATIENT JUST LEARNED HE WAS DIABETIC   Hepatitis    Hypertension    Hypokalemia    Hyponatremia    Necrotizing fasciitis (HCC)    Pneumonia    HX OF PNA    Objective:  Physical Exam: Vascular: DP/PT pulses 2/4 bilateral. CFT <3 seconds. Normal hair growth on digits. No edema.  Skin. No lacerations or abrasions bilateral feet. Right foot plantar wound with granular base measuring about 0.5 cm x 0.5 cm x0.3 cm. No erythema edema or purulence noted.  Musculoskeletal: MMT 5/5 bilateral lower extremities in DF, PF, Inversion and Eversion. Deceased ROM in DF of ankle joint.  BKA on the left.  Neurological: Sensation intact to light touch.   Assessment:   1. Diabetic ulcer of right midfoot associated with diabetes mellitus due to underlying condition, with fat layer exposed ([place])   2. Diabetic polyneuropathy associated with type 2 diabetes mellitus ([place])      Plan:  Patient was evaluated and treated and all questions answered. Ulcer right foot with fat layer exposed  -Debridement as below. -Dressed with betadine, DSD. -Off-loading with wheel chair  -No abx indicated.  -Discussed glucose control and proper protein-rich diet.  -Discussed if any worsening redness, pain, fever or chills to call or may need to report to the emergency room. Patient expressed  understanding.   Procedure: Excisional Debridement of Wound Rationale: Removal of non-viable soft tissue from the wound to promote healing.  Anesthesia: none Pre-Debridement Wound Measurements: Overylying hyperkeratotis  Post-Debridement Wound Measurements: 0.5 cm x 0.5 cm x 0.3 cm  Type of Debridement: Sharp Excisional Tissue Removed: Non-viable soft tissue Depth of Debridement: subcutaneous tissue. Technique: Sharp excisional debridement to bleeding, viable wound base.  Dressing: Dry, sterile, compression dressing. Disposition: Patient tolerated procedure well. Patient to return in 2 week for follow-up.  No follow-ups on file.   Lorenda Peck, DPM

## 2021-08-30 ENCOUNTER — Ambulatory Visit (INDEPENDENT_AMBULATORY_CARE_PROVIDER_SITE_OTHER): Payer: Medicare Other | Admitting: Podiatry

## 2021-08-30 ENCOUNTER — Other Ambulatory Visit: Payer: Self-pay

## 2021-08-30 ENCOUNTER — Encounter: Payer: Self-pay | Admitting: Podiatry

## 2021-08-30 DIAGNOSIS — L84 Corns and callosities: Secondary | ICD-10-CM

## 2021-08-30 DIAGNOSIS — E08621 Diabetes mellitus due to underlying condition with foot ulcer: Secondary | ICD-10-CM

## 2021-08-30 DIAGNOSIS — L97412 Non-pressure chronic ulcer of right heel and midfoot with fat layer exposed: Secondary | ICD-10-CM

## 2021-08-30 DIAGNOSIS — E1142 Type 2 diabetes mellitus with diabetic polyneuropathy: Secondary | ICD-10-CM | POA: Diagnosis not present

## 2021-08-30 NOTE — Progress Notes (Signed)
°  Subjective:  Patient ID: [name], male    DOB: 1956/05/02,   MRN: [id number]  Chief Complaint  Patient presents with   Foot Ulcer    Pt is here to f/u ulcer on right foot    66 y.o. male presents for follow-up of right foot ulcer. Relates they have been dressing everyday. Recently stopped as they believed it was healed. Denies any pain.   . Denies any other pedal complaints. Denies n/v/f/c.    Past Medical History:  Diagnosis Date   Diabetes mellitus without complication ([place])    PATIENT JUST LEARNED HE WAS DIABETIC   Hepatitis    Hypertension    Hypokalemia    Hyponatremia    Necrotizing fasciitis (HCC)    Pneumonia    HX OF PNA    Objective:  Physical Exam: Vascular: DP/PT pulses 2/4 bilateral. CFT <3 seconds. Normal hair growth on digits. No edema.  Skin. No lacerations or abrasions bilateral feet. Right foot plantar wound healed underlying hyperkeratosis.  Musculoskeletal: MMT 5/5 bilateral lower extremities in DF, PF, Inversion and Eversion. Deceased ROM in DF of ankle joint.  BKA on the left.  Neurological: Sensation intact to light touch.   Assessment:   1. Diabetic polyneuropathy associated with type 2 diabetes mellitus ([place])   2. Diabetic ulcer of right midfoot associated with diabetes mellitus due to underlying condition, with fat layer exposed ([place])   3. Corns and callosities   4. Pre-ulcerative calluses      Plan:  Patient was evaluated and treated and all questions answered. Ulcer right foot with fat layer exposed -Healed  -Hyperkeratotic tissue debrided without incident and healed ulcer underneath.  -No dressing needed.  -Off-loading with wheel chair  -No abx indicated.  -Discussed glucose control and proper protein-rich diet.  -Discussed if any worsening redness, pain, fever or chills to call or may need to report to the emergency room. Patient expressed understanding.  Patient to return in 3 months for re-evaluation.     Return in about 3  months (around 11/28/2021) for wound check.   Lorenda Peck, DPM

## 2021-10-15 ENCOUNTER — Other Ambulatory Visit: Payer: Self-pay | Admitting: *Deleted

## 2021-10-15 NOTE — Patient Outreach (Signed)
[facility]) Care Management ? ?10/15/2021 ? ?[name] ?1955/09/08 ?[id number] ? ? ?[facility] coordination with case closure ? ?RN CM received patient for blue cross and blue shield screening referral for [facility] complex care services  ?With preparation to outreach and review of chart, patient noted with a discharge to [facility] on 12/17/19 ?PING/[place] indicating patient remains at [facility] ?Outreach to [facility] and spoke with [place] at [phone]  ?This same number is listed as the preferred number for pt in EPIC. It is [employer] main number  ? ?Malachy Mood confirms pt continues to reside at [facility]  ?Transferred to SNF CM/SW but not answer ? ?Pt not with Blue cross coverage He has Medicare and SNF medicaid coverage only  ?No pcp listed as he is seen by snf providers  ? ?Plan ?[facility] RN CM will Close [facility] case - Pt is not eligible for services- has external care management services at [facility]  ? ? ?Ruhan Borak L. Lavina Hamman, RN, BSN, CCM ?[facility] Management Care Coordinator ?Office number [phone] ? ?

## 2021-11-29 ENCOUNTER — Ambulatory Visit: Payer: Medicare Other | Admitting: Podiatry

## 2021-12-01 ENCOUNTER — Encounter: Payer: Self-pay | Admitting: Podiatry

## 2021-12-01 ENCOUNTER — Ambulatory Visit (INDEPENDENT_AMBULATORY_CARE_PROVIDER_SITE_OTHER): Payer: Medicare Other | Admitting: Podiatry

## 2021-12-01 DIAGNOSIS — L84 Corns and callosities: Secondary | ICD-10-CM | POA: Diagnosis not present

## 2021-12-01 DIAGNOSIS — E1142 Type 2 diabetes mellitus with diabetic polyneuropathy: Secondary | ICD-10-CM | POA: Diagnosis not present

## 2021-12-01 NOTE — Progress Notes (Signed)
?  Subjective:  ?Patient ID: [name], male    DOB: May 19, 1956,   MRN: [id number] ? ?Chief Complaint  ?Patient presents with  ? Diabetic Ulcer  ?  Diabetic right foot ulcer   A1c:  ? ? ?66 y.o. male presents for follow-up of right foot ulcer and check. . Relates he has been doing well without any issues and it is still healed.  Denies any pain.   . Denies any other pedal complaints. Denies n/v/f/c.  ? ? ?Past Medical History:  ?Diagnosis Date  ? Diabetes mellitus without complication ([name])   ? PATIENT JUST LEARNED HE WAS DIABETIC  ? Hepatitis   ? Hypertension   ? Hypokalemia   ? Hyponatremia   ? Necrotizing fasciitis ([place])   ? Pneumonia   ? HX OF PNA  ? ? ?Objective:  ?Physical Exam: ?Vascular: DP/PT pulses 2/4 bilateral. CFT <3 seconds. Normal hair growth on digits. No edema.  ?Skin. No lacerations or abrasions bilateral feet. Right foot plantar wound healed underlying hyperkeratosis. Right heel hyperkeratotic as well.  ?Musculoskeletal: MMT 5/5 bilateral lower extremities in DF, PF, Inversion and Eversion. Deceased ROM in DF of ankle joint.  BKA on the left.  ?Neurological: Sensation intact to light touch.  ? ?Assessment:  ? ?1. Diabetic polyneuropathy associated with type 2 diabetes mellitus ([place])   ?2. Corns and callosities   ?3. Pre-ulcerative calluses   ? ? ? ? ?Plan:  ?Patient was evaluated and treated and all questions answered. ?Ulcer right foot with fat layer exposed -Healed  ?-Hyperkeratotic tissue debrided without incident and healed ulcer underneath.  ?-No dressing needed.  ?-Off-loading with wheel chair  ?-No abx indicated.  ?-Discussed glucose control and proper protein-rich diet.  ?-Discussed if any worsening redness, pain, fever or chills to call or may need to report to the emergency room. Patient expressed understanding.  ?Patient to return in 3 months for re-evaluation.  ? ? ? ?Return in about 3 months (around 03/03/2022) for rfc. ? ? ?Lorenda Peck, DPM  ? ? ?

## 2022-03-08 ENCOUNTER — Ambulatory Visit: Payer: Medicare Other | Admitting: Podiatry

## 2022-03-16 ENCOUNTER — Ambulatory Visit: Payer: Medicare Other

## 2022-03-23 ENCOUNTER — Ambulatory Visit (INDEPENDENT_AMBULATORY_CARE_PROVIDER_SITE_OTHER): Payer: Medicare Other | Admitting: Physician Assistant

## 2022-03-23 VITALS — BP 116/69 | HR 92 | Temp 98.5°F | Ht 63.0 in | Wt 160.0 lb

## 2022-03-23 DIAGNOSIS — N186 End stage renal disease: Secondary | ICD-10-CM

## 2022-03-23 DIAGNOSIS — Z992 Dependence on renal dialysis: Secondary | ICD-10-CM | POA: Diagnosis not present

## 2022-03-23 NOTE — Progress Notes (Signed)
Office Note     CC:  follow up Requesting Provider:  Claudia Desanctis, MD  HPI: [name] is a 66 y.o. (08-02-1955) male who presents for evaluation of scab overlying left arm AV fistula.  Left brachiocephalic fistula was created by Dr. Donnetta Hutching in 2021.  He denies any bleeding episodes from left arm fistula.  He believes by the time of appointment the area in question has nearly healed.  [facility] on a Tuesday Thursday Saturday schedule.  He currently resides in a nursing facility.   Past Medical History:  Diagnosis Date   Diabetes mellitus without complication ([place])    PATIENT JUST LEARNED HE WAS DIABETIC   Hepatitis    Hypertension    Hypokalemia    Hyponatremia    Necrotizing fasciitis ([place])    Pneumonia    HX OF PNA    Past Surgical History:  Procedure Laterality Date   AMPUTATION Left 09/11/2013   Procedure: AMPUTATION BELOW KNEE;  Surgeon: Marianna Payment, MD;  Location: [place];  Service: Orthopedics;  Laterality: Left;   AMPUTATION Right 12/18/2013   Procedure: RIGHT FOOT 1,2, TOE AMPUTATION  5th toe RAY AMPUTATION;  Surgeon: Marianna Payment, MD;  Location: [place];  Service: Orthopedics;  Laterality: Right;   AV FISTULA PLACEMENT Left 12/11/2019   Procedure: LEFT BRACHIOCEPHALIC FISTULA CREATION;  Surgeon: Rosetta Posner, MD;  Location: [place];  Service: Vascular;  Laterality: Left;   CHOLECYSTECTOMY     I & D EXTREMITY Left 09/03/2013   Procedure: IRRIGATION AND DEBRIDEMENT EXTREMITY;  Surgeon: Marianna Payment, MD;  Location: [place];  Service: Orthopedics;  Laterality: Left;   I & D EXTREMITY Left 09/11/2013   Procedure: LEFT FOOT IRRIGATION AND DEBRIDEMENT;  Surgeon: Marianna Payment, MD;  Location: [place];  Service: Orthopedics;  Laterality: Left;   INSERTION OF DIALYSIS CATHETER  12/11/2019   Procedure: Insertion Of Dialysis Catheter;  Surgeon: Rosetta Posner, MD;  Location: [facility];  Service: Vascular;;   LEFT HEART CATH AND  CORONARY ANGIOGRAPHY N/A 12/13/2019   Procedure: LEFT HEART CATH AND CORONARY ANGIOGRAPHY;  Surgeon: Jettie Booze, MD;  Location: [facility] [facility];  Service: Cardiovascular;  Laterality: N/A;   RIGHT HEART CATH N/A 12/13/2019   Procedure: RIGHT HEART CATH;  Surgeon: Jettie Booze, MD;  Location: [place] [facility];  Service: Cardiovascular;  Laterality: N/A;    Social History   Socioeconomic History   Marital status: Single    Spouse name: Not on file   Number of children: Not on file   Years of education: Not on file   Highest education level: Not on file  Occupational History   Not on file  Tobacco Use   Smoking status: Never   Smokeless tobacco: Never  Vaping Use   Vaping Use: Never used  Substance and Sexual Activity   Alcohol use: No   Drug use: No   Sexual activity: Not on file  Other Topics Concern   Not on file  Social History Narrative   Not on file   Social Determinants of Health   Financial Resource Strain: Not on file  Food Insecurity: Not on file  Transportation Needs: Not on file  Physical Activity: Not on file  Stress: Not on file  Social Connections: Not on file  Intimate Partner Violence: Not on file    Family History  Problem Relation Age of Onset   Diabetes type  II Mother    Dementia Mother    Heart disease Father     Current Outpatient Medications  Medication Sig Dispense Refill   amLODipine (NORVASC) 5 MG tablet Take 5 mg by mouth daily.     atorvastatin (LIPITOR) 20 MG tablet Take 1 tablet (20 mg total) by mouth daily.     carvedilol (COREG) 25 MG tablet Take 1 tablet (25 mg total) by mouth 2 (two) times daily with a meal.     hydrALAZINE (APRESOLINE) 10 MG tablet Take 1 tablet (10 mg total) by mouth 3 (three) times daily. 90 tablet 6   hydrocerin (EUCERIN) CREA Apply 1 application topically 2 (two) times daily.  0   iron sucrose in sodium chloride 0.9 % 100 mL Iron Sucrose (Venofer)     isosorbide mononitrate (IMDUR) 30 MG  24 hr tablet Take 0.5 tablets (15 mg total) by mouth daily.     multivitamin (RENA-VIT) TABS tablet Take 1 tablet by mouth at bedtime.  0   sevelamer carbonate (RENVELA) 800 MG tablet Take 2 tablets (1,600 mg total) by mouth 3 (three) times daily with meals.     No current facility-administered medications for this visit.    Allergies  Allergen Reactions   Tramadol Nausea And Vomiting    Pt states he took this on an empty stomach one time and it came right back up. He's not had it since.      REVIEW OF SYSTEMS:   [X]  denotes positive finding, [ ]  denotes negative finding Cardiac  Comments:  Chest pain or chest pressure:    Shortness of breath upon exertion:    Short of breath when lying flat:    Irregular heart rhythm:        Vascular    Pain in calf, thigh, or hip brought on by ambulation:    Pain in feet at night that wakes you up from your sleep:     Blood clot in your veins:    Leg swelling:         Pulmonary    Oxygen at home:    Productive cough:     Wheezing:         Neurologic    Sudden weakness in arms or legs:     Sudden numbness in arms or legs:     Sudden onset of difficulty speaking or slurred speech:    Temporary loss of vision in one eye:     Problems with dizziness:         Gastrointestinal    Blood in stool:     Vomited blood:         Genitourinary    Burning when urinating:     Blood in urine:        Psychiatric    Major depression:         Hematologic    Bleeding problems:    Problems with blood clotting too easily:        Skin    Rashes or ulcers:        Constitutional    Fever or chills:      PHYSICAL EXAMINATION:  Vitals:   03/23/22 0928  BP: 116/69  Pulse: 92  Temp: 98.5 F (36.9 C)  TempSrc: Temporal  SpO2: 99%  Weight: 160 lb (72.6 kg)  Height: 5\' 3"  (1.6 m)    General:  WDWN in NAD; vital signs documented above Gait: Wheelchair HENT: WNL, normocephalic Pulmonary: normal non-labored breathing Cardiac: regular  HR Abdomen: soft, NT, no masses Skin: without rashes Vascular Exam/Pulses:  Right Left  Radial 2+ (normal) 2+ (normal)   Extremities: Palpable thrill throughout left brachiocephalic fistula; area in question in left upper arm with very small scab but mobile skin overlying fistula Musculoskeletal: no muscle wasting or atrophy  Neurologic: A&O X 3;  No focal weakness or paresthesias are detected Psychiatric:  The pt has Normal affect.   ASSESSMENT/PLAN:: 67 y.o. male with concern for overlying ulceration of left arm AV fistula  -Area in question has nearly healed by the time of appointment.  This area is also mobile indicating there are layers between the skin surface and the fistula without communication.  At this time there is minimal concern for any spontaneous bleeding.  No indication for revision by plication at this time.  Patient will continue HD via left arm brachiocephalic fistula.  He will follow-up on an as-needed basis.   Dagoberto Ligas, PA-C [facility] [phone]  Clinic MD: Donzetta Matters

## 2022-04-01 ENCOUNTER — Ambulatory Visit (INDEPENDENT_AMBULATORY_CARE_PROVIDER_SITE_OTHER): Payer: Medicare Other | Admitting: Podiatry

## 2022-04-01 ENCOUNTER — Encounter: Payer: Self-pay | Admitting: Podiatry

## 2022-04-01 DIAGNOSIS — M79674 Pain in right toe(s): Secondary | ICD-10-CM | POA: Diagnosis not present

## 2022-04-01 DIAGNOSIS — B351 Tinea unguium: Secondary | ICD-10-CM

## 2022-04-01 DIAGNOSIS — M79675 Pain in left toe(s): Secondary | ICD-10-CM

## 2022-04-01 DIAGNOSIS — E1142 Type 2 diabetes mellitus with diabetic polyneuropathy: Secondary | ICD-10-CM | POA: Diagnosis not present

## 2022-04-01 NOTE — Progress Notes (Signed)
  Subjective:  Patient ID: [name], male    DOB: [birth year],   MRN: [id number]  Chief Complaint  Patient presents with   Nail Problem    Right foot heel check .Marland Kitchen Possible callus    66 y.o. male presents for follow-up of right foot ulcer and routine foot care . Relates he has been doing well without any issues and it is still healed.  Denies any pain.   . Denies any other pedal complaints. Denies n/v/f/c.    Past Medical History:  Diagnosis Date   Diabetes mellitus without complication ([place])    PATIENT JUST LEARNED HE WAS DIABETIC   Hepatitis    Hypertension    Hypokalemia    Hyponatremia    Necrotizing fasciitis ([facility])    Pneumonia    HX OF PNA    Objective:  Physical Exam: Vascular: DP/PT pulses 2/4 bilateral. CFT <3 seconds. Normal hair growth on digits. No edema.  Skin. No lacerations or abrasions bilateral feet. Right foot plantar wound healed underlying hyperkeratosis. Right heel hyperkeratotic as well. Two digits remaining nails thickened discolored and elongated with subungual debris.  Musculoskeletal: MMT 5/5 bilateral lower extremities in DF, PF, Inversion and Eversion. Deceased ROM in DF of ankle joint.  BKA on the left. Right foot fifth digit amputation and first and second digit amputaiton.  Neurological: Sensation intact to light touch.   Assessment:   1. Pre-ulcerative calluses   2. Diabetic polyneuropathy associated with type 2 diabetes mellitus ([place])       Plan:  Patient was evaluated and treated and all questions answered. Ulcer right foot with fat layer exposed -Healed  -Discussed and educated patient on diabetic foot care, especially with  regards to the vascular, neurological and musculoskeletal systems.  -Stressed the importance of good glycemic control and the detriment of not  controlling glucose levels in relation to the foot. -Discussed supportive shoes at all times and checking feet regularly.  -Mechanically debrided all nails x2 bilateral  using sterile nail nipper and filed with dremel without incident  -Answered all patient questions -Patient to return  in 3 months for at risk foot care -Patient advised to call the office if any problems or questions arise in the meantime.     No follow-ups on file.   Lorenda Peck, DPM

## 2022-07-06 ENCOUNTER — Encounter: Payer: Self-pay | Admitting: Podiatry

## 2022-07-07 ENCOUNTER — Ambulatory Visit: Payer: Medicare Other | Admitting: Podiatry

## 2022-07-08 ENCOUNTER — Ambulatory Visit (INDEPENDENT_AMBULATORY_CARE_PROVIDER_SITE_OTHER): Payer: Medicare Other | Admitting: Vascular Surgery

## 2022-07-08 ENCOUNTER — Other Ambulatory Visit: Payer: Self-pay

## 2022-07-08 ENCOUNTER — Encounter (HOSPITAL_COMMUNITY): Payer: Self-pay | Admitting: Vascular Surgery

## 2022-07-08 ENCOUNTER — Ambulatory Visit: Payer: Medicare Other | Admitting: Podiatry

## 2022-07-08 ENCOUNTER — Encounter: Payer: Self-pay | Admitting: Vascular Surgery

## 2022-07-08 VITALS — BP 162/83 | HR 93 | Temp 98.1°F | Resp 20 | Ht 63.0 in | Wt 160.0 lb

## 2022-07-08 DIAGNOSIS — N186 End stage renal disease: Secondary | ICD-10-CM

## 2022-07-08 DIAGNOSIS — T82590A Other mechanical complication of surgically created arteriovenous fistula, initial encounter: Secondary | ICD-10-CM

## 2022-07-08 NOTE — Progress Notes (Signed)
Pt resident of [facility] [phone]. Spoke with pt's nurse Colletta Maryland. Provided DOS instructions over the phone, faxed a copy to [phone]. Nurse stated that facility transport would be bringing pt. Transport number: [phone].  Requested copy of med list with last doses listed. Hemodialysis T-TH-SAT

## 2022-07-08 NOTE — Progress Notes (Signed)
Anesthesia Chart Review: [name]  Case: [id number] Date/Time: 07/11/22 1215   Procedures:      REVISON OF LEFT ARM ARTERIOVENOUS FISTULA (Left)     INSERTION OF TUNNELED DIALYSIS CATHETER   Anesthesia type: Monitor Anesthesia Care   Pre-op diagnosis: End Stage Renal Disease, Fistula Ulcer   Location: [facility] OR ROOM 16 / [place] OR   Surgeons: Broadus John, MD       DISCUSSION: Patient is a 66 year old male scheduled for the above procedure. Patient had evaluation by Dr. Virl Cagey on Friday 07/08/22 and late add-on for OR 07/11/22. He has a left brachiocephalic AVF and has developed ulceration over the AVF. No acute bleeding issues, but due to their size and location revision of the AVF recommended with insertion of TDC.    History includes never smoker, HTN, HLD, DM2, ESRD (HD TTS), CHF, CAD (moderate 2021, medical therapy), necrotizing fasciitis (09/2013, s/p left BKA 09/11/13), right foot infection (s/p right 1st, 2nd, 5th toe amputations 12/18/13).  He was seeing cardiologist Dr. 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 He was treated with medical therapy. Of note, this was also around the time that we was diagnosed with ESRD and hemodialysis initiated. His EF improved to 50-55% by 04/10/20 echo.  Last cardiology visit seen is from 04/15/20 with Sande Rives, PA-C.  Medications adjusted for hypertension.  Continue medical therapy for CAD.  38-month follow-up planned but did not happen. By medication list, he does remain on b-blocker (Coreg), amlodipine, ASA 81 mg, Imdur.   He is a same day work-up following surgeon evaluation earlier today (07/08/22). He need AVF revised due to ulceration and risk for bleeding. VVS notes indicate that he lives in a local nursing facility ([facility] in [place]). Anesthesia team to evaluate on the day of surgery. EKG and labs on arrival.    VS:   BP Readings from Last 3 Encounters:  07/08/22 (!) 162/83  03/23/22 116/69  04/15/20 (!) 164/78   Pulse Readings from Last 3 Encounters:  07/08/22 93  03/23/22 92  04/15/20 82     PROVIDERS: Patient, No Pcp Per Cherlynn Kaiser, MD is cardiologist, last visit in 2021.    LABS: For day of surgery.   EKG: Last EKG available is > 31 year old, 12/12/19: Normal sinus rhythm Cannot rule out Inferior infarct , age undetermined No significant change since last tracing Confirmed by Croitoru, Mihai [phone]) on 12/12/2019 5:00:59 PM   CV: Echo 04/10/20: IMPRESSIONS   1. Left ventricular ejection fraction, by estimation, is 50 to 55%. The  left ventricle has low normal function. The left ventricle has no regional  wall motion abnormalities. Left ventricular diastolic parameters are  consistent with Grade II diastolic  dysfunction (pseudonormalization). Elevated left atrial pressure.   2. Right ventricular systolic function is normal. The right ventricular  size is normal.   3. The mitral valve is normal in structure. Trivial mitral valve  regurgitation. No evidence of mitral stenosis.   4. The aortic valve is normal in structure. Aortic valve regurgitation is  not visualized. No aortic stenosis is present.   5. The inferior vena cava is normal in size with greater than 50%  respiratory variability, suggesting right atrial pressure of 3 mmHg.  - Comparison(s): Prior images reviewed side by side. The left ventricular  function has improved. The left ventricular diastolic function has  improved. Although there is still evidence  of high left heart filling  pressures, the E/A and E/e' ratios are lower  compared to April 2021.    LHC/RHC 12/13/19: RPDA lesion is 50% stenosed. Mid LM to Dist LM lesion is 50% stenosed. Prox Cx to Mid Cx lesion is 100% stenosed. Mid LAD lesion is 75% stenosed. Dist LAD-1 lesion is 50% stenosed. Dist LAD-2 lesion is 90% stenosed. LV end diastolic  pressure is moderately elevated. There is no aortic valve stenosis. Diffuse CAD. He denied angina. He has had DOE. Hemodynamic findings consistent with moderate pulmonary hypertension. Ao sat 95%, PA sat 62%, mean PA pressure 36 mm Hg; mean PCWP 23 mm Hg; CO 7.0 L/min; CI 4   Moderate CAD.  Cardiomyopathy seems out of proportion to CAD, but ischemia may be a component of the LV dysfunction.     Elevated LVEDP.  Patient lying on a wedge due to shortness of breath.  Continue medical therapy for LV dysfunction.     Nuclear stress test 12/06/19: There was no ST segment deviation noted during stress. Defect 1: There is a large defect of severe severity present in the basal inferolateral, mid inferolateral, apical septal, apical lateral and apex location. Findings consistent with ischemia. Nuclear stress EF: 31%. This is a high risk study. The left ventricular ejection fraction is moderately decreased (30-44%). No T wave inversion was noted during stress.   Large size, severe intensity apical and inferolateral perfusion defect which is reversible (SDS 7), suggestive of possible LCx territory ischemia. LVEF 31% with severe global hypokinesis and inferolateral hypokinesis to akinesis. This is a high risk study.  - s/p RHC/LHC 12/13/19 (see above)   NM Cardiac Amyloid Tumor Study 12/04/19: IMPRESSION: Visual and quantitative assessment (grade 1, H/CL equal 1.0) are NOT suggestive of transthyretin amyloidosis.  Past Medical History:  Diagnosis Date   Chronic kidney disease    Diabetes mellitus without complication ([place])    PATIENT JUST LEARNED HE WAS DIABETIC   Hepatitis    Hypertension    Hypokalemia    Hyponatremia    Necrotizing fasciitis ([place])    Pneumonia    HX OF PNA    Past Surgical History:  Procedure Laterality Date   AMPUTATION Left 09/11/2013   Procedure: AMPUTATION BELOW KNEE;  Surgeon: Marianna Payment, MD;  Location: [place];  Service: Orthopedics;  Laterality: Left;    AMPUTATION Right 12/18/2013   Procedure: RIGHT FOOT 1,2, TOE AMPUTATION  5th toe RAY AMPUTATION;  Surgeon: Marianna Payment, MD;  Location: [place];  Service: Orthopedics;  Laterality: Right;   AV FISTULA PLACEMENT Left 12/11/2019   Procedure: LEFT BRACHIOCEPHALIC FISTULA CREATION;  Surgeon: Rosetta Posner, MD;  Location: [place];  Service: Vascular;  Laterality: Left;   CHOLECYSTECTOMY     I & D EXTREMITY Left 09/03/2013   Procedure: IRRIGATION AND DEBRIDEMENT EXTREMITY;  Surgeon: Marianna Payment, MD;  Location: [place];  Service: Orthopedics;  Laterality: Left;   I & D EXTREMITY Left 09/11/2013   Procedure: LEFT FOOT IRRIGATION AND DEBRIDEMENT;  Surgeon: Marianna Payment, MD;  Location: [place];  Service: Orthopedics;  Laterality: Left;   INSERTION OF DIALYSIS CATHETER  12/11/2019   Procedure: Insertion Of Dialysis Catheter;  Surgeon: Rosetta Posner, MD;  Location: [facility];  Service: Vascular;;   LEFT HEART CATH AND CORONARY ANGIOGRAPHY N/A 12/13/2019   Procedure: LEFT HEART CATH AND CORONARY ANGIOGRAPHY;  Surgeon: Jettie Booze, MD;  Location: [facility];  Service: Cardiovascular;  Laterality: N/A;  RIGHT HEART CATH N/A 12/13/2019   Procedure: RIGHT HEART CATH;  Surgeon: Jettie Booze, MD;  Location: [place] [facility];  Service: Cardiovascular;  Laterality: N/A;    MEDICATIONS: No current facility-administered medications for this encounter.    amLODipine (NORVASC) 5 MG tablet   aspirin EC 81 MG tablet   atorvastatin (LIPITOR) 20 MG tablet   carvedilol (COREG) 25 MG tablet   isosorbide mononitrate (IMDUR) 30 MG 24 hr tablet   Multiple Vitamins-Minerals (MULTIVITAMIN WITH MINERALS) tablet   sevelamer carbonate (RENVELA) 800 MG tablet   hydrocerin (EUCERIN) CREA    Myra Gianotti, PA-C Surgical Short Stay/Anesthesiology [facility] Phone [phone] [facility] Phone [phone] 07/08/2022 4:53 PM

## 2022-07-08 NOTE — Anesthesia Preprocedure Evaluation (Signed)
Anesthesia Evaluation  Patient identified by MRN, date of birth, ID band Patient awake    Reviewed: Allergy & Precautions, NPO status , Patient's Chart, lab work & pertinent test results  History of Anesthesia Complications Negative for: history of anesthetic complications  Airway Mallampati: III  TM Distance: >3 FB Neck ROM: Full    Dental  (+) Dental Advisory Given, Edentulous Lower,    Pulmonary neg shortness of breath, neg sleep apnea, neg COPD, neg recent URI   breath sounds clear to auscultation       Cardiovascular hypertension, Pt. on medications and Pt. on home beta blockers (-) angina + Peripheral Vascular Disease and +CHF   Rhythm:Regular  1. Left ventricular ejection fraction, by estimation, is 50 to 55%. The  left ventricle has low normal function. The left ventricle has no regional  wall motion abnormalities. Left ventricular diastolic parameters are  consistent with Grade II diastolic  dysfunction (pseudonormalization). Elevated left atrial pressure.   2. Right ventricular systolic function is normal. The right ventricular  size is normal.   3. The mitral valve is normal in structure. Trivial mitral valve  regurgitation. No evidence of mitral stenosis.   4. The aortic valve is normal in structure. Aortic valve regurgitation is  not visualized. No aortic stenosis is present.   5. The inferior vena cava is normal in size with greater than 50%  respiratory variability, suggesting right atrial pressure of 3 mmHg.     RPDA lesion is 50% stenosed.  Mid LM to Dist LM lesion is 50% stenosed.  Prox Cx to Mid Cx lesion is 100% stenosed.  Mid LAD lesion is 75% stenosed.  Dist LAD-1 lesion is 50% stenosed.  Dist LAD-2 lesion is 90% stenosed.  LV end diastolic pressure is moderately elevated.  There is no aortic valve stenosis.  Diffuse CAD. He denied angina. He has had DOE.  Hemodynamic findings consistent  with moderate pulmonary hypertension.  Ao sat 95%, PA sat 62%, mean PA pressure 36 mm Hg; mean PCWP 23 mm Hg; CO 7.0 L/min; CI 4   Moderate CAD.  Cardiomyopathy seems out of proportion to CAD, but ischemia may be a component of the LV dysfunction.     Elevated LVEDP.  Patient lying on a wedge due to shortness of breath.  Continue medical therapy for LV dysfunction.      Neuro/Psych  Neuromuscular disease  negative psych ROS   GI/Hepatic negative GI ROS,,,(+) Hepatitis -  Endo/Other  diabetes  Lab Results      Component                Value               Date                      HGBA1C                   5.5                 11/29/2019             Renal/GU ESRF and DialysisRenal disease     Musculoskeletal   Abdominal   Peds  Hematology  (+) Blood dyscrasia, anemia Lab Results      Component                Value               Date  WBC                      10.7                04/15/2020                HGB                      10.2 (L)            07/11/2022                HCT                      30.0 (L)            07/11/2022                MCV                      90                  04/15/2020                PLT                      194                 04/15/2020              Anesthesia Other Findings   Reproductive/Obstetrics                             Anesthesia Physical Anesthesia Plan  ASA: 4  Anesthesia Plan: MAC   Post-op Pain Management: Minimal or no pain anticipated   Induction: Intravenous  PONV Risk Score and Plan: 1 and Ondansetron and Propofol infusion  Airway Management Planned: Natural Airway and Nasal Cannula  Additional Equipment: None  Intra-op Plan:   Post-operative Plan:   Informed Consent: I have reviewed the patients History and Physical, chart, labs and discussed the procedure including the risks, benefits and alternatives for the proposed anesthesia with the patient or authorized  representative who has indicated his/her understanding and acceptance.     Dental advisory given  Plan Discussed with: CRNA  Anesthesia Plan Comments: (PAT note written 07/08/2022 by Myra Gianotti, PA-C.   Echo 04/10/20: IMPRESSIONS  1. Left ventricular ejection fraction, by estimation, is 50 to 55%. The  left ventricle has low normal function. The left ventricle has no regional  wall motion abnormalities. Left ventricular diastolic parameters are  consistent with Grade II diastolic  dysfunction (pseudonormalization). Elevated left atrial pressure.  2. Right ventricular systolic function is normal. The right ventricular  size is normal.  3. The mitral valve is normal in structure. Trivial mitral valve  regurgitation. No evidence of mitral stenosis.  4. The aortic valve is normal in structure. Aortic valve regurgitation is  not visualized. No aortic stenosis is present.  5. The inferior vena cava is normal in size with greater than 50%  respiratory variability, suggesting right atrial pressure of 3 mmHg.  - Comparison(s): Prior images reviewed side by side. The left ventricular  function has improved. The left ventricular diastolic function has  improved. Although there is still evidence of high left heart filling  pressures, the E/A and E/e' ratios are lower  compared to  April 2021.    LHC/RHC 12/13/19: ? RPDA lesion is 50% stenosed. ? Mid LM to Dist LM lesion is 50% stenosed. ? Prox Cx to Mid Cx lesion is 100% stenosed. ? Mid LAD lesion is 75% stenosed. ? Dist LAD-1 lesion is 50% stenosed. ? Dist LAD-2 lesion is 90% stenosed. ? LV end diastolic pressure is moderately elevated. ? There is no aortic valve stenosis. ? Diffuse CAD. He denied angina. He has had DOE. ? Hemodynamic findings consistent with moderate pulmonary hypertension. ? Ao sat 95%, PA sat 62%, mean PA pressure 36 mm Hg; mean PCWP 23 mm Hg; CO 7.0 L/min; CI 4  Moderate CAD. Cardiomyopathy seems out of  proportion to CAD, but ischemia may be a component of the LV dysfunction.   Elevated LVEDP. Patient lying on a wedge due to shortness of breath. Continue medical therapy for LV dysfunction.  )       Anesthesia Quick Evaluation

## 2022-07-08 NOTE — H&P (View-Only) (Signed)
Office Note     CC: Ulcerated left upper extremity brachiocephalic fistula Requesting Provider:  Glynn Octave, NP  HPI: [name] is a 66 y.o. (12/09/1955) male presenting at the request dialysis center with ulcerations at his left brachiocephalic fistula.  [name], [name].  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 [name].  His dialysis nurses have not been accessing the lesions with eschar.  No continuous bleeds, no prolonged pressure after dialysis decannulation. Dialysis days Tuesday Thursday Saturday.    Past Medical History:  Diagnosis Date   Chronic kidney disease    Diabetes mellitus without complication ([place])    PATIENT JUST LEARNED HE WAS DIABETIC   Hepatitis    Hypertension    Hypokalemia    Hyponatremia    Necrotizing fasciitis ([place])    Pneumonia    HX OF PNA    Past Surgical History:  Procedure Laterality Date   AMPUTATION Left 09/11/2013   Procedure: AMPUTATION BELOW KNEE;  Surgeon: Marianna Payment, MD;  Location: [facility];  Service: Orthopedics;  Laterality: Left;   AMPUTATION Right 12/18/2013   Procedure: RIGHT FOOT 1,2, TOE AMPUTATION  5th toe RAY AMPUTATION;  Surgeon: Marianna Payment, MD;  Location: [facility];  Service: Orthopedics;  Laterality: Right;   AV FISTULA PLACEMENT Left 12/11/2019   Procedure: LEFT BRACHIOCEPHALIC FISTULA CREATION;  Surgeon: Rosetta Posner, MD;  Location: [facility];  Service: Vascular;  Laterality: Left;   CHOLECYSTECTOMY     I & D EXTREMITY Left 09/03/2013   Procedure: IRRIGATION AND DEBRIDEMENT EXTREMITY;  Surgeon: Marianna Payment, MD;  Location: [facility];  Service: Orthopedics;  Laterality: Left;   I & D EXTREMITY Left 09/11/2013   Procedure: LEFT FOOT IRRIGATION AND DEBRIDEMENT;  Surgeon: Marianna Payment, MD;  Location: [facility];  Service: Orthopedics;   Laterality: Left;   INSERTION OF DIALYSIS CATHETER  12/11/2019   Procedure: Insertion Of Dialysis Catheter;  Surgeon: Rosetta Posner, MD;  Location: [facility] OR;  Service: Vascular;;   LEFT HEART CATH AND CORONARY ANGIOGRAPHY N/A 12/13/2019   Procedure: LEFT HEART CATH AND CORONARY ANGIOGRAPHY;  Surgeon: Jettie Booze, MD;  Location: [facility];  Service: Cardiovascular;  Laterality: N/A;   RIGHT HEART CATH N/A 12/13/2019   Procedure: RIGHT HEART CATH;  Surgeon: Jettie Booze, MD;  Location: [facility] [facility];  Service: Cardiovascular;  Laterality: N/A;    Social History   Socioeconomic History   Marital status: Single    Spouse name: Not on file   Number of children: Not on file   Years of education: Not on file   Highest education level: Not on file  Occupational History   Not on file  Tobacco Use   Smoking status: Never   Smokeless tobacco: Never  Vaping Use   Vaping Use: Never used  Substance and Sexual Activity   Alcohol use: No   Drug use: No   Sexual activity: Not on file  Other Topics Concern   Not on file  Social History Narrative   ** Merged History Encounter **       Social Determinants of Health   Financial Resource Strain: Not on file  Food Insecurity: Not on file  Transportation Needs: Not on file  Physical Activity: Not on file  Stress: Not on file  Social Connections: Not on file  Intimate Partner Violence: Not on file   Family History  Problem Relation Age of Onset   Diabetes type II Mother    Dementia Mother    Heart disease Father     Current Outpatient Medications  Medication Sig Dispense Refill   amLODipine (NORVASC) 5 MG tablet Take 5 mg by mouth daily.     atorvastatin (LIPITOR) 20 MG tablet Take 1 tablet (20 mg total) by mouth daily.     carvedilol (COREG) 25 MG tablet Take 1 tablet (25 mg total) by mouth 2 (two) times daily with a meal.     hydrocerin (EUCERIN) CREA Apply 1 application topically 2 (two) times daily.  0    isosorbide mononitrate (IMDUR) 30 MG 24 hr tablet Take 0.5 tablets (15 mg total) by mouth daily.     multivitamin (RENA-VIT) TABS tablet Take 1 tablet by mouth at bedtime.  0   sevelamer carbonate (RENVELA) 800 MG tablet Take 2 tablets (1,600 mg total) by mouth 3 (three) times daily with meals.     hydrALAZINE (APRESOLINE) 10 MG tablet Take 1 tablet (10 mg total) by mouth 3 (three) times daily. (Patient not taking: Reported on 07/08/2022) 90 tablet 6   No current facility-administered medications for this visit.    Allergies  Allergen Reactions   Tramadol Nausea And Vomiting    Pt states he took this on an empty stomach one time and it came right back up. He's not had it since.      REVIEW OF SYSTEMS:  [X]  denotes positive finding, [ ]  denotes negative finding Cardiac  Comments:  Chest pain or chest pressure:    Shortness of breath upon exertion:    Short of breath when lying flat:    Irregular heart rhythm:        Vascular    Pain in calf, thigh, or hip brought on by ambulation:    Pain in feet at night that wakes you up from your sleep:     Blood clot in your veins:    Leg swelling:         Pulmonary    Oxygen at home:    Productive cough:     Wheezing:         Neurologic    Sudden weakness in arms or legs:     Sudden numbness in arms or legs:     Sudden onset of difficulty speaking or slurred speech:    Temporary loss of vision in one eye:     Problems with dizziness:         Gastrointestinal    Blood in stool:     Vomited blood:         Genitourinary    Burning when urinating:     Blood in urine:        Psychiatric    Major depression:         Hematologic    Bleeding problems:    Problems with blood clotting too easily:        Skin    Rashes or ulcers:        Constitutional    Fever or chills:      PHYSICAL EXAMINATION:  Vitals:   07/08/22 1011  BP: (!) 162/83  Pulse: 93  Resp: 20  Temp: 98.1 F (36.7 C)  SpO2: 93%  Weight: 160 lb (72.6 kg)   Height: 5\' 3"  (1.6 m)    General:  WDWN in  NAD; vital signs documented above Gait: Not observed HENT: WNL, normocephalic Pulmonary: normal non-labored breathing , without wheezing Cardiac: regular HR,  Abdomen: soft, NT, no masses Skin: without rashes Vascular Exam/Pulses:  Right Left  Radial 2+ (normal) 2+ (normal)  w                     Extremities: without ischemic changes, without Gangrene , without cellulitis; without open wounds;  Left arm with excellent thrill and brachiocephalic fistula.  2 areas of ulceration. Musculoskeletal: no muscle wasting or atrophy  Neurologic: A&O X 3;  No focal weakness or paresthesias are detected Psychiatric:  The pt has Normal affect.   Non-Invasive Vascular Imaging:   -    ASSESSMENT/PLAN: Nassir Neidert is a 66 y.o. male presenting with ulcerations on the left upper extremity brachiocephalic fistula.  I am encouraged that these have not had any bleeding [name], however they should be revised due to their size and location.  Discussing the risk and benefits of left arm AV fistula revision with tunneled dialysis catheter placement, [place] elected to proceed.    Ian John, MD [facility] [phone]

## 2022-07-08 NOTE — Progress Notes (Signed)
Office Note     CC: Ulcerated left upper extremity brachiocephalic fistula Requesting Provider:  Glynn Octave, NP  HPI: [name] is a 66 y.o. (1956-05-28) male presenting at the request dialysis center with ulcerations at his left brachiocephalic fistula.  [name], [name] was doing well.  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 Semir denies recent bleeding issues.  His dialysis nurses have not been accessing the lesions with eschar.  No continuous bleeds, no prolonged pressure after dialysis decannulation. Dialysis days Tuesday Thursday Saturday.    Past Medical History:  Diagnosis Date   Chronic kidney disease    Diabetes mellitus without complication ([place])    PATIENT JUST LEARNED HE WAS DIABETIC   Hepatitis    Hypertension    Hypokalemia    Hyponatremia    Necrotizing fasciitis ([place])    Pneumonia    HX OF PNA    Past Surgical History:  Procedure Laterality Date   AMPUTATION Left 09/11/2013   Procedure: AMPUTATION BELOW KNEE;  Surgeon: Marianna Payment, MD;  Location: [facility];  Service: Orthopedics;  Laterality: Left;   AMPUTATION Right 12/18/2013   Procedure: RIGHT FOOT 1,2, TOE AMPUTATION  5th toe RAY AMPUTATION;  Surgeon: Marianna Payment, MD;  Location: [facility];  Service: Orthopedics;  Laterality: Right;   AV FISTULA PLACEMENT Left 12/11/2019   Procedure: LEFT BRACHIOCEPHALIC FISTULA CREATION;  Surgeon: Rosetta Posner, MD;  Location: [place];  Service: Vascular;  Laterality: Left;   CHOLECYSTECTOMY     I & D EXTREMITY Left 09/03/2013   Procedure: IRRIGATION AND DEBRIDEMENT EXTREMITY;  Surgeon: Marianna Payment, MD;  Location: [facility];  Service: Orthopedics;  Laterality: Left;   I & D EXTREMITY Left 09/11/2013   Procedure: LEFT FOOT IRRIGATION AND DEBRIDEMENT;  Surgeon: Marianna Payment, MD;  Location: [facility];  Service: Orthopedics;   Laterality: Left;   INSERTION OF DIALYSIS CATHETER  12/11/2019   Procedure: Insertion Of Dialysis Catheter;  Surgeon: Rosetta Posner, MD;  Location: [facility] OR;  Service: Vascular;;   LEFT HEART CATH AND CORONARY ANGIOGRAPHY N/A 12/13/2019   Procedure: LEFT HEART CATH AND CORONARY ANGIOGRAPHY;  Surgeon: Jettie Booze, MD;  Location: [place] [facility];  Service: Cardiovascular;  Laterality: N/A;   RIGHT HEART CATH N/A 12/13/2019   Procedure: RIGHT HEART CATH;  Surgeon: Jettie Booze, MD;  Location: [place] [facility];  Service: Cardiovascular;  Laterality: N/A;    Social History   Socioeconomic History   Marital status: Single    Spouse name: Not on file   Number of children: Not on file   Years of education: Not on file   Highest education level: Not on file  Occupational History   Not on file  Tobacco Use   Smoking status: Never   Smokeless tobacco: Never  Vaping Use   Vaping Use: Never used  Substance and Sexual Activity   Alcohol use: No   Drug use: No   Sexual activity: Not on file  Other Topics Concern   Not on file  Social History Narrative   ** Merged History Encounter **       Social Determinants of Health   Financial Resource Strain: Not on file  Food Insecurity: Not on file  Transportation Needs: Not on file  Physical Activity: Not on file  Stress: Not on file  Social Connections: Not on file  Intimate Partner Violence: Not on file   Family History  Problem Relation Age of Onset   Diabetes type II Mother    Dementia Mother    Heart disease Father     Current Outpatient Medications  Medication Sig Dispense Refill   amLODipine (NORVASC) 5 MG tablet Take 5 mg by mouth daily.     atorvastatin (LIPITOR) 20 MG tablet Take 1 tablet (20 mg total) by mouth daily.     carvedilol (COREG) 25 MG tablet Take 1 tablet (25 mg total) by mouth 2 (two) times daily with a meal.     hydrocerin (EUCERIN) CREA Apply 1 application topically 2 (two) times daily.  0    isosorbide mononitrate (IMDUR) 30 MG 24 hr tablet Take 0.5 tablets (15 mg total) by mouth daily.     multivitamin (RENA-VIT) TABS tablet Take 1 tablet by mouth at bedtime.  0   sevelamer carbonate (RENVELA) 800 MG tablet Take 2 tablets (1,600 mg total) by mouth 3 (three) times daily with meals.     hydrALAZINE (APRESOLINE) 10 MG tablet Take 1 tablet (10 mg total) by mouth 3 (three) times daily. (Patient not taking: Reported on 07/08/2022) 90 tablet 6   No current facility-administered medications for this visit.    Allergies  Allergen Reactions   Tramadol Nausea And Vomiting    Pt states he took this on an empty stomach one time and it came right back up. He's not had it since.      REVIEW OF SYSTEMS:  [X]  denotes positive finding, [ ]  denotes negative finding Cardiac  Comments:  Chest pain or chest pressure:    Shortness of breath upon exertion:    Short of breath when lying flat:    Irregular heart rhythm:        Vascular    Pain in calf, thigh, or hip brought on by ambulation:    Pain in feet at night that wakes you up from your sleep:     Blood clot in your veins:    Leg swelling:         Pulmonary    Oxygen at home:    Productive cough:     Wheezing:         Neurologic    Sudden weakness in arms or legs:     Sudden numbness in arms or legs:     Sudden onset of difficulty speaking or slurred speech:    Temporary loss of vision in one eye:     Problems with dizziness:         Gastrointestinal    Blood in stool:     Vomited blood:         Genitourinary    Burning when urinating:     Blood in urine:        Psychiatric    Major depression:         Hematologic    Bleeding problems:    Problems with blood clotting too easily:        Skin    Rashes or ulcers:        Constitutional    Fever or chills:      PHYSICAL EXAMINATION:  Vitals:   07/08/22 1011  BP: (!) 162/83  Pulse: 93  Resp: 20  Temp: 98.1 F (36.7 C)  SpO2: 93%  Weight: 160 lb (72.6 kg)   Height: 5\' 3"  (1.6 m)    General:  WDWN in  NAD; vital signs documented above Gait: Not observed HENT: WNL, normocephalic Pulmonary: normal non-labored breathing , without wheezing Cardiac: regular HR,  Abdomen: soft, NT, no masses Skin: without rashes Vascular Exam/Pulses:  Right Left  Radial 2+ (normal) 2+ (normal)  w                     Extremities: without ischemic changes, without Gangrene , without cellulitis; without open wounds;  Left arm with excellent thrill and brachiocephalic fistula.  2 areas of ulceration. Musculoskeletal: no muscle wasting or atrophy  Neurologic: A&O X 3;  No focal weakness or paresthesias are detected Psychiatric:  The pt has Normal affect.   Non-Invasive Vascular Imaging:   -    ASSESSMENT/PLAN: Jaquaveon Bilal is a 66 y.o. male presenting with ulcerations on the left upper extremity brachiocephalic fistula.  I am encouraged that these have not had any bleeding issues, however they should be revised due to their size and location.  Discussing the risk and benefits of left arm AV fistula revision with tunneled dialysis catheter placement, [place] elected to proceed.    Broadus John, MD [facility] [phone]

## 2022-07-08 NOTE — Progress Notes (Signed)
Surgical Instructions    Your procedure is scheduled on Monday, July 11, 2022.  Report to [facility] Main Entrance "A" at 10:00 A.M., then check in with the Admitting office.  Call this number if you have problems the morning of surgery:  [phone]  If you experience any cold or flu symptoms such as cough, fever, chills, shortness of breath, etc. between now and your scheduled surgery, please notify us at the above number     Remember:  Do not eat or drink after midnight the night before your surgery     Take these medicines the morning of surgery with A SIP OF WATER:  AMLODIPINE (NORVASC) ATORVASTATIN (LIPITOR) CARVEDILOL (COREG) ISOSORBIDE MONONITRATE (IMDUR)  As of today, STOP taking any Aspirin (unless otherwise instructed by your surgeon) Aleve, Naproxen, Ibuprofen, Motrin, Advil, Goody's, BC's, all herbal medications, fish oil, and all vitamins.   **THE DAY OF SURGERY, PLEASE SEND AN UPDATED MED LIST WITH LAST DOSES DOCUMENTED**  HOW TO MANAGE YOUR DIABETES BEFORE AND AFTER SURGERY  Why is it important to control my blood sugar before and after surgery? Improving blood sugar levels before and after surgery helps healing and can limit problems. A way of improving blood sugar control is eating a healthy diet by:  Eating less sugar and carbohydrates  Increasing activity/exercise  Talking with your doctor about reaching your blood sugar goals High blood sugars (greater than 180 mg/dL) can raise your risk of infections and slow your recovery, so you will need to focus on controlling your diabetes during the weeks before surgery. Make sure that the doctor who takes care of your diabetes knows about your planned surgery including the date and location.  How do I manage my blood sugar before surgery? Check your blood sugar at least 4 times a day, starting 2 days before surgery, to make sure that the level is not too high or low.  Check your blood sugar the morning of  your surgery when you wake up and every 2 hours until you get to the Short Stay unit.  If your blood sugar is less than 70 mg/dL, you will need to treat for low blood sugar: Do not take insulin. Treat a low blood sugar (less than 70 mg/dL) with  cup of clear juice (cranberry or apple), 4 glucose tablets, OR glucose gel. Recheck blood sugar in 15 minutes after treatment (to make sure it is greater than 70 mg/dL). If your blood sugar is not greater than 70 mg/dL on recheck, call [phone] for further instructions. Report your blood sugar to the short stay nurse when you get to Short Stay.  If you are admitted to the hospital after surgery: Your blood sugar will be checked by the staff and you will probably be given insulin after surgery (instead of oral diabetes medicines) to make sure you have good blood sugar levels. The goal for blood sugar control after surgery is 80-180 mg/dL.           Do not wear jewelry. Do not wear lotions, powders, perfumes/cologne or deodorant. Do not shave 48 hours prior to surgery.  Men may shave face and neck. Do not bring valuables to the hospital.   [facility] is not responsible for any belongings or valuables.    Do NOT Smoke (Tobacco/Vaping)  24 hours prior to your procedure  If you use a CPAP at night, you may bring your mask for your overnight stay.   Contacts, glasses, hearing aids, dentures or partials may  not be worn into surgery, please bring cases for these belongings   For patients admitted to the hospital, discharge time will be determined by your treatment team.   Patients discharged the day of surgery will not be allowed to drive home, and someone needs to stay with them for 24 hours.   SURGICAL WAITING ROOM VISITATION Patients having surgery or a procedure may have no more than 2 support people in the waiting area - these visitors may rotate.   Children under the age of 79 must have an adult with them who is not the patient. If the  patient needs to stay at the hospital during part of their recovery, the visitor guidelines for inpatient rooms apply. Pre-op nurse will coordinate an appropriate time for 1 support person to accompany patient in pre-op.  This support person may not rotate.   Please refer to [email or web address].hu for the visitor guidelines for Inpatients (after your surgery is over and you are in a regular room).    Special instructions:    Oral Hygiene is also important to reduce your risk of infection.  Remember - BRUSH YOUR TEETH THE MORNING OF SURGERY WITH YOUR REGULAR TOOTHPASTE      Please read over the following fact sheets that you were given.

## 2022-07-11 ENCOUNTER — Ambulatory Visit (HOSPITAL_COMMUNITY)
Admission: RE | Admit: 2022-07-11 | Discharge: 2022-07-11 | Disposition: A | Discharge status: Home / Self Care | Payer: Medicare Other | Attending: Vascular Surgery | Admitting: Vascular Surgery

## 2022-07-11 ENCOUNTER — Ambulatory Visit (HOSPITAL_COMMUNITY): Payer: Medicare Other | Admitting: Vascular Surgery

## 2022-07-11 ENCOUNTER — Other Ambulatory Visit: Payer: Self-pay

## 2022-07-11 ENCOUNTER — Ambulatory Visit (HOSPITAL_COMMUNITY): Payer: Medicare Other

## 2022-07-11 ENCOUNTER — Encounter (HOSPITAL_COMMUNITY)
Admission: RE | Disposition: A | Discharge status: Home / Self Care | Payer: Self-pay | Source: Home / Self Care | Attending: Vascular Surgery

## 2022-07-11 ENCOUNTER — Ambulatory Visit (HOSPITAL_BASED_OUTPATIENT_CLINIC_OR_DEPARTMENT_OTHER): Payer: Medicare Other | Admitting: Vascular Surgery

## 2022-07-11 ENCOUNTER — Encounter (HOSPITAL_COMMUNITY): Payer: Self-pay | Admitting: Vascular Surgery

## 2022-07-11 DIAGNOSIS — Z79899 Other long term (current) drug therapy: Secondary | ICD-10-CM | POA: Insufficient documentation

## 2022-07-11 DIAGNOSIS — I509 Heart failure, unspecified: Secondary | ICD-10-CM

## 2022-07-11 DIAGNOSIS — Z89612 Acquired absence of left leg above knee: Secondary | ICD-10-CM | POA: Insufficient documentation

## 2022-07-11 DIAGNOSIS — I252 Old myocardial infarction: Secondary | ICD-10-CM | POA: Insufficient documentation

## 2022-07-11 DIAGNOSIS — I132 Hypertensive heart and chronic kidney disease with heart failure and with stage 5 chronic kidney disease, or end stage renal disease: Secondary | ICD-10-CM | POA: Insufficient documentation

## 2022-07-11 DIAGNOSIS — N186 End stage renal disease: Secondary | ICD-10-CM

## 2022-07-11 DIAGNOSIS — E1122 Type 2 diabetes mellitus with diabetic chronic kidney disease: Secondary | ICD-10-CM

## 2022-07-11 DIAGNOSIS — T82590A Other mechanical complication of surgically created arteriovenous fistula, initial encounter: Secondary | ICD-10-CM

## 2022-07-11 DIAGNOSIS — D631 Anemia in chronic kidney disease: Secondary | ICD-10-CM

## 2022-07-11 DIAGNOSIS — T82898A Other specified complication of vascular prosthetic devices, implants and grafts, initial encounter: Secondary | ICD-10-CM

## 2022-07-11 DIAGNOSIS — N185 Chronic kidney disease, stage 5: Secondary | ICD-10-CM

## 2022-07-11 DIAGNOSIS — X58XXXA Exposure to other specified factors, initial encounter: Secondary | ICD-10-CM | POA: Diagnosis not present

## 2022-07-11 DIAGNOSIS — Z992 Dependence on renal dialysis: Secondary | ICD-10-CM | POA: Insufficient documentation

## 2022-07-11 DIAGNOSIS — I5022 Chronic systolic (congestive) heart failure: Secondary | ICD-10-CM | POA: Insufficient documentation

## 2022-07-11 HISTORY — DX: Heart failure, unspecified: I50.9

## 2022-07-11 HISTORY — DX: Hyperlipidemia, unspecified: E78.5

## 2022-07-11 HISTORY — PX: INSERTION OF DIALYSIS CATHETER: SHX1324

## 2022-07-11 HISTORY — PX: REVISON OF ARTERIOVENOUS FISTULA: SHX6074

## 2022-07-11 LAB — POCT I-STAT, CHEM 8
BUN: 83 mg/dL — ABNORMAL HIGH (ref 8–23)
Calcium, Ion: 1.09 mmol/L — ABNORMAL LOW (ref 1.15–1.40)
Chloride: 102 mmol/L (ref 98–111)
Creatinine, Ser: 8.9 mg/dL — ABNORMAL HIGH (ref 0.61–1.24)
Glucose, Bld: 176 mg/dL — ABNORMAL HIGH (ref 70–99)
HCT: 30 % — ABNORMAL LOW (ref 39.0–52.0)
Hemoglobin: 10.2 g/dL — ABNORMAL LOW (ref 13.0–17.0)
Potassium: 4.8 mmol/L (ref 3.5–5.1)
Sodium: 139 mmol/L (ref 135–145)
TCO2: 26 mmol/L (ref 22–32)

## 2022-07-11 LAB — GLUCOSE, CAPILLARY
Glucose-Capillary: 169 mg/dL — ABNORMAL HIGH (ref 70–99)
Glucose-Capillary: 171 mg/dL — ABNORMAL HIGH (ref 70–99)

## 2022-07-11 SURGERY — REVISON OF ARTERIOVENOUS FISTULA
Anesthesia: Monitor Anesthesia Care | Site: Neck | Laterality: Right

## 2022-07-11 MED ORDER — PROPOFOL 10 MG/ML IV BOLUS
INTRAVENOUS | Status: DC | PRN
  Administered 2022-07-11: 50 mg via INTRAVENOUS
  Administered 2022-07-11: 30 mg via INTRAVENOUS

## 2022-07-11 MED ORDER — SODIUM CHLORIDE 0.9 % IV SOLN: INTRAVENOUS | Status: DC

## 2022-07-11 MED ORDER — ORAL CARE MOUTH RINSE: 15.0000 mL | Freq: Once | OROMUCOSAL | Status: AC

## 2022-07-11 MED ORDER — ROCURONIUM BROMIDE 10 MG/ML (PF) SYRINGE
PREFILLED_SYRINGE | INTRAVENOUS | Status: AC
  Filled 2022-07-11: qty 10

## 2022-07-11 MED ORDER — CHLORHEXIDINE GLUCONATE 0.12 % MT SOLN
15.0000 mL | Freq: Once | OROMUCOSAL | Status: AC
  Administered 2022-07-11: 15 mL via OROMUCOSAL
  Filled 2022-07-11: qty 15

## 2022-07-11 MED ORDER — HEPARIN SODIUM (PORCINE) 1000 UNIT/ML IJ SOLN
INTRAMUSCULAR | Status: AC
  Filled 2022-07-11: qty 10

## 2022-07-11 MED ORDER — ACETAMINOPHEN 160 MG/5ML PO SOLN: 1000.0000 mg | Freq: Once | ORAL | Status: DC | PRN

## 2022-07-11 MED ORDER — FENTANYL CITRATE (PF) 100 MCG/2ML IJ SOLN: 25.0000 ug | INTRAMUSCULAR | Status: DC | PRN

## 2022-07-11 MED ORDER — ACETAMINOPHEN 10 MG/ML IV SOLN: 1000.0000 mg | Freq: Once | INTRAVENOUS | Status: DC | PRN

## 2022-07-11 MED ORDER — ONDANSETRON HCL 4 MG/2ML IJ SOLN
INTRAMUSCULAR | Status: DC | PRN
  Administered 2022-07-11: 4 mg via INTRAVENOUS

## 2022-07-11 MED ORDER — PHENYLEPHRINE HCL-NACL 20-0.9 MG/250ML-% IV SOLN
INTRAVENOUS | Status: DC | PRN
  Administered 2022-07-11: 35 ug/min via INTRAVENOUS

## 2022-07-11 MED ORDER — PROPOFOL 10 MG/ML IV BOLUS
INTRAVENOUS | Status: AC
  Filled 2022-07-11: qty 20

## 2022-07-11 MED ORDER — CHLORHEXIDINE GLUCONATE 4 % EX LIQD: 60.0000 mL | Freq: Once | CUTANEOUS | Status: DC

## 2022-07-11 MED ORDER — LACTATED RINGERS IV SOLN: INTRAVENOUS | Status: DC

## 2022-07-11 MED ORDER — LIDOCAINE HCL (PF) 1 % IJ SOLN
INTRAMUSCULAR | Status: AC
  Filled 2022-07-11: qty 30

## 2022-07-11 MED ORDER — STERILE WATER FOR IRRIGATION IR SOLN
Status: DC | PRN
  Administered 2022-07-11: 1000 mL

## 2022-07-11 MED ORDER — FENTANYL CITRATE (PF) 250 MCG/5ML IJ SOLN
INTRAMUSCULAR | Status: AC
  Filled 2022-07-11: qty 5

## 2022-07-11 MED ORDER — LIDOCAINE 2% (20 MG/ML) 5 ML SYRINGE
INTRAMUSCULAR | Status: AC
  Filled 2022-07-11: qty 10

## 2022-07-11 MED ORDER — ONDANSETRON HCL 4 MG/2ML IJ SOLN
INTRAMUSCULAR | Status: AC
  Filled 2022-07-11: qty 4

## 2022-07-11 MED ORDER — HEPARIN 6000 UNIT IRRIGATION SOLUTION
Status: AC
  Filled 2022-07-11: qty 500

## 2022-07-11 MED ORDER — DEXAMETHASONE SODIUM PHOSPHATE 10 MG/ML IJ SOLN
INTRAMUSCULAR | Status: AC
  Filled 2022-07-11: qty 2

## 2022-07-11 MED ORDER — LIDOCAINE HCL 1 % IJ SOLN
INTRAMUSCULAR | Status: DC | PRN
  Administered 2022-07-11: 4 mL

## 2022-07-11 MED ORDER — ACETAMINOPHEN 500 MG PO TABS: 1000.0000 mg | ORAL_TABLET | Freq: Once | ORAL | Status: DC | PRN

## 2022-07-11 MED ORDER — HEPARIN SODIUM (PORCINE) 1000 UNIT/ML IJ SOLN
INTRAMUSCULAR | Status: DC | PRN
  Administered 2022-07-11: 3200 [IU]

## 2022-07-11 MED ORDER — OXYCODONE-ACETAMINOPHEN 5-325 MG PO TABS: 1.0000 | ORAL_TABLET | ORAL | 0 refills | Status: DC | PRN

## 2022-07-11 MED ORDER — LIDOCAINE-EPINEPHRINE (PF) 1 %-1:200000 IJ SOLN
INTRAMUSCULAR | Status: AC
  Filled 2022-07-11: qty 30

## 2022-07-11 MED ORDER — 0.9 % SODIUM CHLORIDE (POUR BTL) OPTIME
TOPICAL | Status: DC | PRN
  Administered 2022-07-11: 1000 mL

## 2022-07-11 MED ORDER — HEPARIN 6000 UNIT IRRIGATION SOLUTION
Status: DC | PRN
  Administered 2022-07-11: 1

## 2022-07-11 MED ORDER — LIDOCAINE-EPINEPHRINE (PF) 1 %-1:200000 IJ SOLN
INTRAMUSCULAR | Status: DC | PRN
  Administered 2022-07-11: 28 mL

## 2022-07-11 MED ORDER — FENTANYL CITRATE (PF) 250 MCG/5ML IJ SOLN
INTRAMUSCULAR | Status: DC | PRN
  Administered 2022-07-11: 50 ug via INTRAVENOUS

## 2022-07-11 MED ORDER — INSULIN ASPART 100 UNIT/ML IJ SOLN: 0.0000 [IU] | INTRAMUSCULAR | Status: DC | PRN

## 2022-07-11 MED ORDER — PROTAMINE SULFATE 10 MG/ML IV SOLN
INTRAVENOUS | Status: DC | PRN
  Administered 2022-07-11: 40 mg via INTRAVENOUS

## 2022-07-11 MED ORDER — CEFAZOLIN SODIUM-DEXTROSE 2-4 GM/100ML-% IV SOLN
2.0000 g | INTRAVENOUS | Status: AC
  Administered 2022-07-11: 2 g via INTRAVENOUS
  Filled 2022-07-11: qty 100

## 2022-07-11 MED ORDER — HEPARIN SODIUM (PORCINE) 1000 UNIT/ML IJ SOLN
INTRAMUSCULAR | Status: DC | PRN
  Administered 2022-07-11: 7000 [IU] via INTRAVENOUS

## 2022-07-11 MED ORDER — PROPOFOL 500 MG/50ML IV EMUL
INTRAVENOUS | Status: DC | PRN
  Administered 2022-07-11: 75 ug/kg/min via INTRAVENOUS

## 2022-07-11 MED ORDER — PAPAVERINE HCL 30 MG/ML IJ SOLN
INTRAMUSCULAR | Status: AC
  Filled 2022-07-11: qty 2

## 2022-07-11 SURGICAL SUPPLY — 60 items
ARMBAND PINK RESTRICT EXTREMIT (MISCELLANEOUS) ×2 IMPLANT
BAG COUNTER SPONGE SURGICOUNT (BAG) ×2 IMPLANT
BAG DECANTER FOR FLEXI CONT (MISCELLANEOUS) ×2 IMPLANT
BIOPATCH RED 1 DISK 7.0 (GAUZE/BANDAGES/DRESSINGS) ×2 IMPLANT
BNDG ELASTIC 4X5.8 VLCR STR LF (GAUZE/BANDAGES/DRESSINGS) ×2 IMPLANT
CANISTER SUCT 3000ML PPV (MISCELLANEOUS) ×2 IMPLANT
CANNULA VESSEL 3MM 2 BLNT TIP (CANNULA) IMPLANT
CATH PALINDROME-P 19CM W/VT (CATHETERS) IMPLANT
CLIP LIGATING EXTRA MED SLVR (CLIP) ×2 IMPLANT
CLIP LIGATING EXTRA SM BLUE (MISCELLANEOUS) ×2 IMPLANT
CLIP VESOCCLUDE MED 6/CT (CLIP) ×2 IMPLANT
COVER PROBE W GEL 5X96 (DRAPES) ×2 IMPLANT
COVER SURGICAL LIGHT HANDLE (MISCELLANEOUS) ×2 IMPLANT
DERMABOND ADVANCED .7 DNX12 (GAUZE/BANDAGES/DRESSINGS) ×2 IMPLANT
DRAPE C-ARM 42X72 X-RAY (DRAPES) ×2 IMPLANT
DRAPE CHEST BREAST 15X10 FENES (DRAPES) ×2 IMPLANT
DRAPE ORTHO SPLIT 77X108 STRL (DRAPES) ×2
DRAPE SURG ORHT 6 SPLT 77X108 (DRAPES) IMPLANT
DRSG COVADERM 4X6 (GAUZE/BANDAGES/DRESSINGS) IMPLANT
DRSG DERMACEA NONADH 3X8 (GAUZE/BANDAGES/DRESSINGS) IMPLANT
ELECT REM PT RETURN 9FT ADLT (ELECTROSURGICAL) ×2
ELECTRODE REM PT RTRN 9FT ADLT (ELECTROSURGICAL) ×2 IMPLANT
GAUZE 4X4 16PLY ~~LOC~~+RFID DBL (SPONGE) ×2 IMPLANT
GAUZE SPONGE 4X4 12PLY STRL LF (GAUZE/BANDAGES/DRESSINGS) IMPLANT
GAUZE XEROFORM 1X8 LF (GAUZE/BANDAGES/DRESSINGS) IMPLANT
GLOVE BIO SURGEON STRL SZ7.5 (GLOVE) ×2 IMPLANT
GLOVE SRG 8 PF TXTR STRL LF DI (GLOVE) ×2 IMPLANT
GLOVE SURG UNDER POLY LF SZ8 (GLOVE) ×4
GOWN STRL REUS W/ TWL LRG LVL3 (GOWN DISPOSABLE) ×4 IMPLANT
GOWN STRL REUS W/TWL 2XL LVL3 (GOWN DISPOSABLE) ×4 IMPLANT
GOWN STRL REUS W/TWL LRG LVL3 (GOWN DISPOSABLE) ×6
KIT BASIN OR (CUSTOM PROCEDURE TRAY) ×2 IMPLANT
KIT PALINDROME-P 55CM (CATHETERS) IMPLANT
KIT TURNOVER KIT B (KITS) ×2 IMPLANT
LOOP VESSEL MINI RED (MISCELLANEOUS) IMPLANT
NDL 18GX1X1/2 (RX/OR ONLY) (NEEDLE) ×2 IMPLANT
NDL HYPO 25GX1X1/2 BEV (NEEDLE) ×2 IMPLANT
NEEDLE 18GX1X1/2 (RX/OR ONLY) (NEEDLE) ×2 IMPLANT
NEEDLE HYPO 25GX1X1/2 BEV (NEEDLE) ×2 IMPLANT
NS IRRIG 1000ML POUR BTL (IV SOLUTION) ×2 IMPLANT
PACK BASIC III (CUSTOM PROCEDURE TRAY) ×2
PACK CV ACCESS (CUSTOM PROCEDURE TRAY) ×2 IMPLANT
PACK SRG BSC III STRL LF ECLPS (CUSTOM PROCEDURE TRAY) ×2 IMPLANT
PAD ARMBOARD 7.5X6 YLW CONV (MISCELLANEOUS) ×4 IMPLANT
SET MICROPUNCTURE 5F STIFF (MISCELLANEOUS) IMPLANT
SUT ETHILON 3 0 PS 1 (SUTURE) ×2 IMPLANT
SUT MNCRL AB 4-0 PS2 18 (SUTURE) ×2 IMPLANT
SUT PROLENE 5 0 C 1 24 (SUTURE) IMPLANT
SUT PROLENE 6 0 BV (SUTURE) IMPLANT
SUT SILK 3 0 SH CR/8 (SUTURE) ×2 IMPLANT
SUT VIC AB 3-0 SH 27 (SUTURE) ×2
SUT VIC AB 3-0 SH 27X BRD (SUTURE) ×2 IMPLANT
SYR 10ML LL (SYRINGE) ×2 IMPLANT
SYR 20ML LL LF (SYRINGE) ×4 IMPLANT
SYR 5ML LL (SYRINGE) ×2 IMPLANT
SYR CONTROL 10ML LL (SYRINGE) ×2 IMPLANT
TOWEL GREEN STERILE (TOWEL DISPOSABLE) ×2 IMPLANT
TOWEL GREEN STERILE FF (TOWEL DISPOSABLE) ×2 IMPLANT
UNDERPAD 30X36 HEAVY ABSORB (UNDERPADS AND DIAPERS) ×2 IMPLANT
WATER STERILE IRR 1000ML POUR (IV SOLUTION) ×2 IMPLANT

## 2022-07-11 NOTE — Interval H&P Note (Signed)
History and Physical Interval Note:  07/11/2022 9:55 AM  [name]  has presented today for surgery, with the diagnosis of End Stage Renal Disease, Fistula Ulcer.  The various methods of treatment have been discussed with the patient and family. After consideration of risks, benefits and other options for treatment, the patient has consented to  Procedure(s): REVISON OF LEFT ARM ARTERIOVENOUS FISTULA (Left) INSERTION OF TUNNELED DIALYSIS CATHETER (N/A) as a surgical intervention.  The patient's history has been reviewed, patient examined, no change in status, stable for surgery.  I have reviewed the patient's chart and labs.  Questions were answered to the patient's satisfaction.     [name] Mayo

## 2022-07-11 NOTE — Op Note (Signed)
NAMECamar [name]    MRN: [id number] DOB: 06/29/1956    DATE OF OPERATION: 07/11/2022  PREOP DIAGNOSIS:    Ulcer over left upper arm fistula  POSTOP DIAGNOSIS:    Same  PROCEDURE:    Ultrasound-guided placement of right IJ tunneled dialysis catheter Revision of left upper arm fistula with plication of aneurysm  SURGEON: Judeth Cornfield. Scot Dock, MD  ASSIST: None  ANESTHESIA: Local with sedation  EBL: Minimal  INDICATIONS:    [name] is a 66 y.o. male who was seen in the office with an ulcer overlying his left upper arm fistula.  Of note the fistula was significantly pulsatile with 2 aneurysms.  One of the aneurysms had a significant open ulcer overlying this.  FINDINGS:   I excised a large part of the aneurysm and repaired the fistula with a running 5 oh.  The fistula had a good thrill at the completion more centrally but more distally remain pulsatile.  I do not think the fistula can be used for several weeks.  TECHNIQUE:   The patient was taken to the operating room and sedated by anesthesia.  I looked with the SonoSite and the right IJ appeared to be patent.  The neck and upper chest were prepped and draped in usual sterile fashion as was the left arm.  Under ultrasound guidance, after the skin was anesthetized, I cannulated the right IJ with a micropuncture needle and a micropuncture sheath was introduced over the wire.  A J-wire was advanced under fluoroscopy down into the right atrium.  The exit site for the 19 cm catheter was selected and the skin anesthetized between the 2 areas.  The catheter was brought through the tunnel.  The tract over the wire was dilated and then the dilator and peel-away sheath were advanced over the wire and the wire and dilator removed.  The catheter was passed through the peel-away sheath and positioned at the cavoatrial junction.  Both ports withdrew easily with and flushed with heparinized saline and filled with concentrated heparin.   The catheter was secured at its exit site with a 3-0 nylon suture.  The IJ cannulation site was closed with a 4-0 subcuticular stitch.  Attention was then turned to the left arm.  The more peripheral aneurysm did not have any significant ulceration just a very small scab which I removed and the skin beneath this was intact.  There was a 1 cm hole over the aneurysm that was more centrally located.  A large elliptical incision was marked and the skin anesthetized.  Dissection was carried down through the scar tissue to the aneurysm which was dissected free enough that I could get proximal and distal control.  The patient was heparinized.  The aneurysm was opened after this was controlled and there was a large amount of laminated thrombus removed.  I excised a large portion of the aneurysm.  The vein was then sewn back with running 5-0 Prolene suture.  There was good hemostasis.  There was a good thrill in the fistula centrally.  Peripherally that was more pulsatile.  Hemostasis was obtained in the wound.  The heparin was partially reversed with protamine.  The wound was closed with a deep layer of 3-0 Vicryl and the skin closed with 4-0 Monocryl.  Dermabond was applied.  The patient tolerated the procedure well was transferred to recovery room in stable condition.  All needle and sponge counts were correct.   [name] Mayo, MD, FACS [facility]  DATE OF DICTATION:   07/11/2022

## 2022-07-11 NOTE — Anesthesia Postprocedure Evaluation (Signed)
Anesthesia Post Note  Patient: [name]  Procedure(s) Performed: REVISON OF LEFT ARM ARTERIOVENOUS FISTULA (Left: Arm Upper) INSERTION OF TUNNELED DIALYSIS CATHETER (Right: Neck)     Patient location during evaluation: PACU Anesthesia Type: MAC Level of consciousness: awake and alert Pain management: pain level controlled Vital Signs Assessment: post-procedure vital signs reviewed and stable Respiratory status: spontaneous breathing, nonlabored ventilation, respiratory function stable and patient connected to nasal cannula oxygen Cardiovascular status: stable and blood pressure returned to baseline Postop Assessment: no apparent nausea or vomiting Anesthetic complications: no   No notable events documented.  Last Vitals:  Vitals:   07/11/22 1230 07/11/22 1245  BP: 122/67 (!) 121/58  Pulse: 83 85  Resp: (!) 22 20  Temp:  36.7 C  SpO2: 93% 97%    Last Pain:  Vitals:   07/11/22 1245  TempSrc:   PainSc: 0-No pain                 Santa Lighter

## 2022-07-11 NOTE — Transfer of Care (Signed)
Immediate Anesthesia Transfer of Care Note  Patient: [name]  Procedure(s) Performed: REVISON OF LEFT ARM ARTERIOVENOUS FISTULA (Left: Arm Upper) INSERTION OF TUNNELED DIALYSIS CATHETER (Right: Neck)  Patient Location: PACU  Anesthesia Type:MAC  Level of Consciousness: awake, alert , and oriented  Airway & Oxygen Therapy: Patient Spontanous Breathing and Patient connected to face mask oxygen  Post-op Assessment: Report given to RN and Post -op Vital signs reviewed and stable  Post vital signs: Reviewed and stable  Last Vitals:  Vitals Value Taken Time  BP 115/93 07/11/22 1219  Temp    Pulse 84 07/11/22 1219  Resp 17 07/11/22 1219  SpO2 99 % 07/11/22 1219  Vitals shown include unvalidated device data.  Last Pain:  Vitals:   07/11/22 0852  TempSrc: Oral  PainSc:          Complications: No notable events documented.

## 2022-07-12 ENCOUNTER — Encounter (HOSPITAL_COMMUNITY): Payer: Self-pay | Admitting: Vascular Surgery

## 2022-08-04 ENCOUNTER — Telehealth: Payer: Self-pay

## 2022-08-04 NOTE — Telephone Encounter (Signed)
-----   Message from Angelia Mould, MD sent at 08/04/2022  8:10 AM EST ----- Regarding: RE: Please advise They can use the fistula now if they do not stick near the incision.  Thank you. CD ----- Message ----- From: Nicholas Lose, RN Sent: 08/03/2022   2:37 PM EST To: Angelia Mould, MD Subject: Please advise                                  CD  Patient is s/p Revision of left upper arm fistula with plication of aneurysm with placement of right IJ tunneled dialysis catheter on 07/11/22. His dialysis center called to inquire when they can use his fistula. Per op note, "I do not think the fistula can be used for several weeks." Can you please clarify for them.   Thanks,  [place]

## 2022-08-04 NOTE — Telephone Encounter (Signed)
Spoke with Dorena Bodo at [facility] dialysis center. Informed her of provider recommendations. She verbalized understanding.

## 2022-09-22 ENCOUNTER — Emergency Department (HOSPITAL_COMMUNITY): Payer: Medicare Other

## 2022-09-22 ENCOUNTER — Encounter (HOSPITAL_COMMUNITY): Payer: Self-pay | Admitting: Student

## 2022-09-22 ENCOUNTER — Inpatient Hospital Stay (HOSPITAL_COMMUNITY)
Admission: EM | Admit: 2022-09-22 | Discharge: 2022-10-06 | DRG: 871 | Disposition: A | Discharge status: Skilled Nursing Facility | Payer: Medicare Other | Source: Skilled Nursing Facility | Attending: Family Medicine | Admitting: Family Medicine

## 2022-09-22 ENCOUNTER — Other Ambulatory Visit: Payer: Self-pay

## 2022-09-22 DIAGNOSIS — I2510 Atherosclerotic heart disease of native coronary artery without angina pectoris: Secondary | ICD-10-CM | POA: Diagnosis present

## 2022-09-22 DIAGNOSIS — R7989 Other specified abnormal findings of blood chemistry: Secondary | ICD-10-CM

## 2022-09-22 DIAGNOSIS — E785 Hyperlipidemia, unspecified: Secondary | ICD-10-CM | POA: Diagnosis present

## 2022-09-22 DIAGNOSIS — R339 Retention of urine, unspecified: Secondary | ICD-10-CM

## 2022-09-22 DIAGNOSIS — I959 Hypotension, unspecified: Secondary | ICD-10-CM

## 2022-09-22 DIAGNOSIS — Z992 Dependence on renal dialysis: Secondary | ICD-10-CM

## 2022-09-22 DIAGNOSIS — I953 Hypotension of hemodialysis: Secondary | ICD-10-CM | POA: Diagnosis not present

## 2022-09-22 DIAGNOSIS — Z89512 Acquired absence of left leg below knee: Secondary | ICD-10-CM

## 2022-09-22 DIAGNOSIS — Z8249 Family history of ischemic heart disease and other diseases of the circulatory system: Secondary | ICD-10-CM

## 2022-09-22 DIAGNOSIS — Z885 Allergy status to narcotic agent status: Secondary | ICD-10-CM

## 2022-09-22 DIAGNOSIS — Z89411 Acquired absence of right great toe: Secondary | ICD-10-CM

## 2022-09-22 DIAGNOSIS — J9601 Acute respiratory failure with hypoxia: Secondary | ICD-10-CM

## 2022-09-22 DIAGNOSIS — Z993 Dependence on wheelchair: Secondary | ICD-10-CM

## 2022-09-22 DIAGNOSIS — I43 Cardiomyopathy in diseases classified elsewhere: Secondary | ICD-10-CM | POA: Diagnosis present

## 2022-09-22 DIAGNOSIS — I5022 Chronic systolic (congestive) heart failure: Secondary | ICD-10-CM

## 2022-09-22 DIAGNOSIS — D631 Anemia in chronic kidney disease: Secondary | ICD-10-CM | POA: Diagnosis present

## 2022-09-22 DIAGNOSIS — J189 Pneumonia, unspecified organism: Secondary | ICD-10-CM | POA: Diagnosis present

## 2022-09-22 DIAGNOSIS — K560 Paralytic ileus: Secondary | ICD-10-CM

## 2022-09-22 DIAGNOSIS — Z89421 Acquired absence of other right toe(s): Secondary | ICD-10-CM

## 2022-09-22 DIAGNOSIS — Z79899 Other long term (current) drug therapy: Secondary | ICD-10-CM

## 2022-09-22 DIAGNOSIS — I82431 Acute embolism and thrombosis of right popliteal vein: Secondary | ICD-10-CM | POA: Diagnosis present

## 2022-09-22 DIAGNOSIS — I132 Hypertensive heart and chronic kidney disease with heart failure and with stage 5 chronic kidney disease, or end stage renal disease: Secondary | ICD-10-CM | POA: Diagnosis present

## 2022-09-22 DIAGNOSIS — R6520 Severe sepsis without septic shock: Secondary | ICD-10-CM | POA: Diagnosis present

## 2022-09-22 DIAGNOSIS — J159 Unspecified bacterial pneumonia: Principal | ICD-10-CM

## 2022-09-22 DIAGNOSIS — N186 End stage renal disease: Secondary | ICD-10-CM

## 2022-09-22 DIAGNOSIS — R0602 Shortness of breath: Secondary | ICD-10-CM | POA: Diagnosis present

## 2022-09-22 DIAGNOSIS — I480 Paroxysmal atrial fibrillation: Secondary | ICD-10-CM | POA: Insufficient documentation

## 2022-09-22 DIAGNOSIS — I255 Ischemic cardiomyopathy: Secondary | ICD-10-CM | POA: Diagnosis present

## 2022-09-22 DIAGNOSIS — I4891 Unspecified atrial fibrillation: Secondary | ICD-10-CM | POA: Insufficient documentation

## 2022-09-22 DIAGNOSIS — K567 Ileus, unspecified: Secondary | ICD-10-CM | POA: Insufficient documentation

## 2022-09-22 DIAGNOSIS — I21A1 Myocardial infarction type 2: Secondary | ICD-10-CM | POA: Diagnosis present

## 2022-09-22 DIAGNOSIS — E1151 Type 2 diabetes mellitus with diabetic peripheral angiopathy without gangrene: Secondary | ICD-10-CM | POA: Diagnosis present

## 2022-09-22 DIAGNOSIS — Z9049 Acquired absence of other specified parts of digestive tract: Secondary | ICD-10-CM

## 2022-09-22 DIAGNOSIS — E1165 Type 2 diabetes mellitus with hyperglycemia: Secondary | ICD-10-CM | POA: Diagnosis present

## 2022-09-22 DIAGNOSIS — I214 Non-ST elevation (NSTEMI) myocardial infarction: Secondary | ICD-10-CM

## 2022-09-22 DIAGNOSIS — A419 Sepsis, unspecified organism: Principal | ICD-10-CM | POA: Diagnosis present

## 2022-09-22 DIAGNOSIS — I5021 Acute systolic (congestive) heart failure: Secondary | ICD-10-CM | POA: Diagnosis present

## 2022-09-22 DIAGNOSIS — I358 Other nonrheumatic aortic valve disorders: Secondary | ICD-10-CM | POA: Diagnosis present

## 2022-09-22 DIAGNOSIS — E1122 Type 2 diabetes mellitus with diabetic chronic kidney disease: Secondary | ICD-10-CM | POA: Diagnosis present

## 2022-09-22 DIAGNOSIS — Z7982 Long term (current) use of aspirin: Secondary | ICD-10-CM

## 2022-09-22 DIAGNOSIS — Y95 Nosocomial condition: Secondary | ICD-10-CM | POA: Diagnosis present

## 2022-09-22 DIAGNOSIS — Z1152 Encounter for screening for COVID-19: Secondary | ICD-10-CM

## 2022-09-22 DIAGNOSIS — D649 Anemia, unspecified: Secondary | ICD-10-CM | POA: Diagnosis present

## 2022-09-22 DIAGNOSIS — Z818 Family history of other mental and behavioral disorders: Secondary | ICD-10-CM

## 2022-09-22 DIAGNOSIS — Z833 Family history of diabetes mellitus: Secondary | ICD-10-CM

## 2022-09-22 DIAGNOSIS — I509 Heart failure, unspecified: Secondary | ICD-10-CM

## 2022-09-22 DIAGNOSIS — E119 Type 2 diabetes mellitus without complications: Secondary | ICD-10-CM

## 2022-09-22 DIAGNOSIS — I82409 Acute embolism and thrombosis of unspecified deep veins of unspecified lower extremity: Secondary | ICD-10-CM

## 2022-09-22 DIAGNOSIS — I5023 Acute on chronic systolic (congestive) heart failure: Secondary | ICD-10-CM

## 2022-09-22 LAB — CBC WITH DIFFERENTIAL/PLATELET
Abs Immature Granulocytes: 0.15 10*3/uL — ABNORMAL HIGH (ref 0.00–0.07)
Basophils Absolute: 0 10*3/uL (ref 0.0–0.1)
Basophils Relative: 0 %
Eosinophils Absolute: 0 10*3/uL (ref 0.0–0.5)
Eosinophils Relative: 0 %
HCT: 30.9 % — ABNORMAL LOW (ref 39.0–52.0)
Hemoglobin: 9.9 g/dL — ABNORMAL LOW (ref 13.0–17.0)
Immature Granulocytes: 1 %
Lymphocytes Relative: 6 %
Lymphs Abs: 1 10*3/uL (ref 0.7–4.0)
MCH: 32.1 pg (ref 26.0–34.0)
MCHC: 32 g/dL (ref 30.0–36.0)
MCV: 100.3 fL — ABNORMAL HIGH (ref 80.0–100.0)
Monocytes Absolute: 0.7 10*3/uL (ref 0.1–1.0)
Monocytes Relative: 4 %
Neutro Abs: 15.3 10*3/uL — ABNORMAL HIGH (ref 1.7–7.7)
Neutrophils Relative %: 89 %
Platelets: 223 10*3/uL (ref 150–400)
RBC: 3.08 MIL/uL — ABNORMAL LOW (ref 4.22–5.81)
RDW: 15.9 % — ABNORMAL HIGH (ref 11.5–15.5)
WBC: 17.2 10*3/uL — ABNORMAL HIGH (ref 4.0–10.5)
nRBC: 0.6 % — ABNORMAL HIGH (ref 0.0–0.2)

## 2022-09-22 LAB — COMPREHENSIVE METABOLIC PANEL
ALT: 19 U/L (ref 0–44)
AST: 35 U/L (ref 15–41)
Albumin: 3.2 g/dL — ABNORMAL LOW (ref 3.5–5.0)
Alkaline Phosphatase: 62 U/L (ref 38–126)
Anion gap: 20 — ABNORMAL HIGH (ref 5–15)
BUN: 16 mg/dL (ref 8–23)
CO2: 25 mmol/L (ref 22–32)
Calcium: 8.5 mg/dL — ABNORMAL LOW (ref 8.9–10.3)
Chloride: 90 mmol/L — ABNORMAL LOW (ref 98–111)
Creatinine, Ser: 3.06 mg/dL — ABNORMAL HIGH (ref 0.61–1.24)
GFR, Estimated: 22 mL/min — ABNORMAL LOW (ref >60)
Glucose, Bld: 125 mg/dL — ABNORMAL HIGH (ref 70–99)
Potassium: 3.5 mmol/L (ref 3.5–5.1)
Sodium: 135 mmol/L (ref 135–145)
Total Bilirubin: 1.4 mg/dL — ABNORMAL HIGH (ref 0.3–1.2)
Total Protein: 7.4 g/dL (ref 6.5–8.1)

## 2022-09-22 LAB — LACTIC ACID, PLASMA
Lactic Acid, Venous: 2.3 mmol/L (ref 0.5–1.9)
Lactic Acid, Venous: 1.6 mmol/L (ref 0.5–1.9)

## 2022-09-22 LAB — RESP PANEL BY RT-PCR (RSV, FLU A&B, COVID)  RVPGX2
Influenza A by PCR: NEGATIVE
Influenza B by PCR: NEGATIVE
Resp Syncytial Virus by PCR: NEGATIVE
SARS Coronavirus 2 by RT PCR: NEGATIVE

## 2022-09-22 LAB — BRAIN NATRIURETIC PEPTIDE: B Natriuretic Peptide: 3189.2 pg/mL — ABNORMAL HIGH (ref 0.0–100.0)

## 2022-09-22 LAB — PROTIME-INR
INR: 1 (ref 0.8–1.2)
Prothrombin Time: 13.5 seconds (ref 11.4–15.2)

## 2022-09-22 LAB — GLUCOSE, CAPILLARY: Glucose-Capillary: 166 mg/dL — ABNORMAL HIGH (ref 70–99)

## 2022-09-22 LAB — CBG MONITORING, ED
Glucose-Capillary: 128 mg/dL — ABNORMAL HIGH (ref 70–99)
Glucose-Capillary: 162 mg/dL — ABNORMAL HIGH (ref 70–99)

## 2022-09-22 LAB — MRSA NEXT GEN BY PCR, NASAL: MRSA by PCR Next Gen: NOT DETECTED

## 2022-09-22 MED ORDER — AMLODIPINE BESYLATE 5 MG PO TABS: 5.0000 mg | ORAL_TABLET | Freq: Every day | ORAL | Status: DC

## 2022-09-22 MED ORDER — SEVELAMER CARBONATE 800 MG PO TABS
800.0000 mg | ORAL_TABLET | Freq: Three times a day (TID) | ORAL | Status: DC
  Administered 2022-09-23 – 2022-10-06 (×31): 800 mg via ORAL
  Filled 2022-09-22 (×31): qty 1

## 2022-09-22 MED ORDER — ISOSORBIDE MONONITRATE ER 30 MG PO TB24
30.0000 mg | ORAL_TABLET | Freq: Every day | ORAL | Status: DC
  Administered 2022-09-22: 30 mg via ORAL
  Filled 2022-09-22 (×2): qty 1

## 2022-09-22 MED ORDER — ATORVASTATIN CALCIUM 10 MG PO TABS
20.0000 mg | ORAL_TABLET | Freq: Every day | ORAL | Status: DC
  Administered 2022-09-23 – 2022-10-06 (×14): 20 mg via ORAL
  Filled 2022-09-22 (×14): qty 2

## 2022-09-22 MED ORDER — LACTATED RINGERS IV SOLN: INTRAVENOUS | Status: DC

## 2022-09-22 MED ORDER — SODIUM CHLORIDE 0.9 % IV SOLN
1.0000 g | INTRAVENOUS | Status: AC
  Administered 2022-09-23 – 2022-10-01 (×9): 1 g via INTRAVENOUS
  Filled 2022-09-22 (×9): qty 10

## 2022-09-22 MED ORDER — VANCOMYCIN HCL 1750 MG/350ML IV SOLN
1750.0000 mg | Freq: Once | INTRAVENOUS | Status: AC
  Administered 2022-09-22: 1750 mg via INTRAVENOUS
  Filled 2022-09-22: qty 350

## 2022-09-22 MED ORDER — VANCOMYCIN HCL IN DEXTROSE 1-5 GM/200ML-% IV SOLN: 1000.0000 mg | Freq: Once | INTRAVENOUS | Status: DC

## 2022-09-22 MED ORDER — SODIUM CHLORIDE 0.9 % IV SOLN
2.0000 g | Freq: Once | INTRAVENOUS | Status: AC
  Administered 2022-09-22: 2 g via INTRAVENOUS
  Filled 2022-09-22: qty 12.5

## 2022-09-22 MED ORDER — HEPARIN SODIUM (PORCINE) 5000 UNIT/ML IJ SOLN
5000.0000 [IU] | Freq: Three times a day (TID) | INTRAMUSCULAR | Status: DC
  Administered 2022-09-22 – 2022-09-23 (×2): 5000 [IU] via SUBCUTANEOUS
  Filled 2022-09-22 (×2): qty 1

## 2022-09-22 MED ORDER — ASPIRIN 81 MG PO TBEC
81.0000 mg | DELAYED_RELEASE_TABLET | Freq: Every day | ORAL | Status: DC
  Administered 2022-09-23 – 2022-09-30 (×7): 81 mg via ORAL
  Filled 2022-09-22 (×7): qty 1

## 2022-09-22 NOTE — Progress Notes (Signed)
Elink will follow per sepsis protocol

## 2022-09-22 NOTE — Assessment & Plan Note (Addendum)
CBG improving. 165 this morning. - CBG 4 times daily before meals and bedtime - on vsSSI

## 2022-09-22 NOTE — Progress Notes (Signed)
Pharmacy Antibiotic Note  [name] is a 67 y.o. male for which pharmacy has been consulted for cefepime and vancomycin dosing for pneumonia.  Patient with a history of ESRD on HD. Patient presenting with persistent productive cough.  WBC 17.2; HR 113; RR 28  Plan: Vancomycin 1773m given once in ED -- subsequent dosing per HD schedule Cefepime 2g given in ED --Will start Cefepime 1g q24hr qpm Trend WBC, Fever, Renal function F/u cultures, clinical course, WBC De-escalate when able F/u Nephrology plan F/u MRSA PCR  Height: 5' 3"$  (160 cm) Weight: 73 kg (160 lb 15 oz) IBW/kg (Calculated) : 56.9  No data recorded.  Recent Labs  Lab 09/22/22 1633  WBC 17.2*    CrCl cannot be calculated (Patient's most recent lab result is older than the maximum 21 days allowed.).    Allergies  Allergen Reactions   Tramadol Nausea And Vomiting    Pt states he took this on an empty stomach one time and it came right back up. He's not had it since.    Microbiology results: Pending  Thank you for allowing pharmacy to be a part of this patient's care.  JLorelei Pont PharmD, BCPS 09/22/2022 5:20 PM ED Clinical Pharmacist -  [phone]

## 2022-09-22 NOTE — Assessment & Plan Note (Addendum)
On admission patient meeting 3/4 SIRS criteria with known source of infection (multifocal PNA). WBC down to 12.2 from 17.2 initially. MRSA swab negative. Vancomycin d/c and cefepime continued. Today he is still tachycardic, tachypneic. Bcx NGTD x 3 days.  - Continuous cardiac monitoring - Incentive spirometry - Encourage p.o. hydration - f/u blood cultures x2 - continue cefipime - PCCM following, appreciate recs

## 2022-09-22 NOTE — H&P (Addendum)
Hospital Admission History and Physical Service Pager: [phone]  Patient name: [name] Medical record number: [id number] Date of Birth: 02-07-1956 Age: 67 y.o. Gender: male  Primary Care Provider: Patient, No Pcp Per Consultants: None Code Status: Full code which was confirmed with patient Preferred Emergency Contact: Doris (however they are not on talking terms in the last 7 years) Contact Information     Name Relation Home Work [place] Sister [phone]     Stevphen Meuse   [phone]       Chief Complaint: Cough and shortness of breath  Assessment and Plan: [name] is a 67 y.o. male presenting with cough and SOB.  Past medical history includes ESRD on HD T/TH/Sat, heart failure, well-controlled T2DM, PAD, unilateral BKA, hyperparathyroidism, hypertension.  Leading differentials for cough with shortness of breath includes: Sepsis 2/2 pneumonia (Met SIRS criteria, right basilar infiltrate on CXR), decompensated CHF (Elevated BNP-but ESRD, last echo in 2021 LVEF 50-55% and Grade II diastolic dysunction). Additional differentials considered but less likely: Pulmonary embolism (tachycardic, questionable coagulopathy per chart review, new oxygen requirement, however clinical presentation also explained by pneumonia), ACS (no chest pain, normal EKG), fluid overload 2/2 ESRD (patient appears euvolemic on exam, received dialysis today), COPD/asthma (no known prior history, non-smoker, no wheezing), COVID/flu negative.  Additional differential and plan as below.  * Sepsis ([place]) Respiratory rate 28, heart rate 113 and WBC 17.2 on presentation.  Afebrile.  New oxygen requirement, normal work of breathing and SpO2 greater than 92% on 4L [place].  Identified possible source as right lower lobe pneumonia, treating for hospital-acquired as patient lives at [facility] facility.  Denies urinary complaints, pending UA (reports making urine, but is ESRD on dialysis).  No visible  cellulitis, but multiple excoriations on legs and arms. MRSA swab negative, consider discontinuing vancomycin.  Caution with IV fluids as patient has ESRD and CHF. - Admit to FMTS, progressive, attending Dr. Ardelia Mems - Continuous cardiac monitoring - F/u blood cultures x2 - S/p vancomycin and cefepime in ED - Incentive spirometry - Received 150 mL/hr LR x 4 hr, Dc'd d/t ESRD - Encourage p.o. hydration - AM CBC with differential  Heart failure, unspecified ([place]) Elevated BNP of 3189 (unclear if acute or chronic-ESRD), suspect related to ESRD.  Last echo 04/2020 showed LVEF of 50-55%, with grade 2 diastolic dysfunction and elevated left atrial pressures.  No overt pulmonary edema on CXR, appears euvolemic on exam-possible fluid with protuberant belly but no fluid wave. - Pending echocardiogram - AM CMP - Strict I's and O's - Daily weights  ESRD on dialysis ([place]) ESRD on HD T/TH/sat, received dialysis today. - Consult nephrology to continue dialysis  Shortness of breath Leading differential is sepsis 2/2 pneumonia and possible decompensated heart failure.  However, patient does have tachycardia with unclear diagnosis of " other specified coagulation defect" on problem list.  Patient is wheelchair-bound.  Low threshold to order CTA to evaluate for PE if patient fails to improve with current management.  Type 2 diabetes mellitus, controlled ([place]) Last visible A1c in chart is from April 2021, of 5.5.  Patient is ESRD on HD, may be falsely low.  Not on diabetic medications.  We will monitor CBGs. - CBG 4 times daily before meals and bedtime   Other chronic medical conditions PAD: Continue aspirin 81 mg daily, atorvastatin 20 mg daily HTN: Continue amlodipine 5 mg daily, Imdur 30 mg daily; hold hydralazine  FEN/GI: Renal diet, fluid restriction 1200 ml VTE Prophylaxis:  Heparin  Disposition: Progressive  History of Present Illness:  [name] is a 67 y.o. male presenting with cough and  shortness of breath.  HPI obtained from patient: Patient endorses cough for 3 days and difficulty breathing.  Denies fever, nausea, vomiting, diarrhea.  Denies coughing up phlegm.  Denies known sick contacts.  States he was seen by facility physician yesterday and started on antibiotics which he does not know the name of.  He reports he takes care of himself completely at baseline, requires wheelchair to get around.  He is unaware of any outstanding wounds.  HPI obtained from LTC facility: Contacted facility by phone.  They report that he was sent to ED today from dialysis, at dialysis was concern for patient's decreased oxygen saturation and worsening cough.  Yesterday, facility physician saw patient.  They obtained CXR and abdominal x-ray, which showed right lower lobe infiltrate.  He was started on a 10-day course of doxycycline twice daily, which he has only received 1 dose.  Tested negative for COVID at that facility.  Facility reports that patient receives help with medications, and we completed the med rec.  Additionally he receives help with some ADLs, including bathing.  In the ED, code sepsis called.  Patient was placed on 150 mL/hr LR, CXR concerning for RLL pneumonia, covered for hospital-acquired pneumonia with vancomycin/cefepime.  Blood cultures were obtained. FMTS was paged for admission.  Review Of Systems: Per HPI  Pertinent Past Medical History: ESRD on HD,  Secondary hyperPTH T2DM Hypertensive cardiomyopathy CHF PAD BKA of LLE  Remainder reviewed in history tab.   Pertinent Past Surgical History: Cholecystectomy  BKA of LLE Right toe 1/2 amputation Remainder reviewed in history tab.  Pertinent Social History: Tobacco use: Never Alcohol use: none Other Substance use: none Lives at [facility].  Pertinent Family History: Mother: T2DM, dementia Father: heart disease Remainder reviewed in history tab.   Important Outpatient Medications: Norvasc '5mg'$  qid Asa  '81mg'$  daily Statin 20 mg 1 tablet at bed (last night) Hydralazine '25mg'$  once a day (lat dose this AM) Isosorbide 24hr release 30 mg once a day for HTN daily (this AM) Doxycycline (first and last dose this a.m.) Guaifenesin 67m TIS for 1 week (last night and this AM) Renvela binder 800 mg TID (last dose this AM) Coreg PRN 12.5 mg (no dose today) Remainder reviewed in medication history.   Objective: BP 115/60 (BP Location: Right Leg)   Pulse (!) 107   Temp 99.9 F (37.7 C) (Oral)   Resp 20   Ht '5\' 3"'$  (1.6 m)   Wt 73 kg   SpO2 93%   BMI 28.51 kg/m  Exam: General: NAD, chronically ill-appearing, resting comfortably in bed Eyes: EOM intact bilaterally, normal conjunctiva ENTM: Cephalic, atraumatic head.  Normal external ear and nose.  Moist mucous membranes. Cardiovascular: tachycardic, regular rhythm, no murmurs Respiratory: Crackles and diminished breath sounds in right lower lobe on auscultation, normal work of breathing on 4 LNC. Gastrointestinal: Soft, nontender, protuberant.  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  Warm and dry.  Moves all 4 extremities.  No edema.  Multiple excoriations present, without infection. Neuro: Alert oriented x 4  Labs:  CBC BMET  Recent Labs  Lab 09/22/22 1633  WBC 17.2*  HGB 9.9*  HCT 30.9*  PLT 223   Recent Labs  Lab 09/22/22 1633  NA 135  K 3.5  CL 90*  CO2 25  BUN 16  CREATININE 3.06*  GLUCOSE 125*  CALCIUM 8.5*    Lactic acid: 2.3>>1.6 BNP: 3189 INR: 1.0  Quad respiratory panel: negative MRSA PCR negative  EKG: Sinus rhythm, no acute ischemia  Imaging Studies Performed:  DG Chest Port 1 View Result Date: 09/22/2022 IMPRESSION: Right basilar pneumonia.   Leslie Dales, DO 09/22/2022, 11:54 PM PGY-1, [place] Intern pager: [phone], text pages welcome Secure chat group [place] Upper-Level Resident Addendum   I have independently interviewed and examined the patient. I have discussed the above with Dr. Nelda Bucks and agree with the documented plan. My edits for correction/addition/clarification are included above. Please see any attending notes.   Wells Guiles, DO PGY-2, [facility] 09/22/2022 11:55 PM  [place] Service pager: [phone] (text pages welcome through [facility])

## 2022-09-22 NOTE — ED Provider Notes (Signed)
[facility] Provider Note   CSN: [id number] Arrival date & time: 09/22/22  1613     History  Chief Complaint  Patient presents with   Cough    [name] is a 67 y.o. male with history of end-stage renal disease on dialysis presenting to ED with 3 days of shortness of breath.  Also reports a persistent productive cough.  He does not wear oxygen at home.  Denies smoking history.  Medical record review shows patient has a history of ischemic and hypertensive cardiomyopathy with an EF of 30 to 35% in 2021 echo.  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  There is concern at his facility the patient may have a possible pneumonia ([facility]) on xray   HPI     Home Medications Prior to Admission medications   Medication Sig Start Date End Date Taking? Authorizing Provider  amLODipine (NORVASC) 5 MG tablet Take 5 mg by mouth daily. 07/22/21   [provider]  aspirin EC 81 MG tablet Take 81 mg by mouth daily. Swallow whole.    [provider]  atorvastatin (LIPITOR) 20 MG tablet Take 1 tablet (20 mg total) by mouth daily. 12/17/19   [name], MD  carvedilol (COREG) 25 MG tablet Take 1 tablet (25 mg total) by mouth 2 (two) times daily with a meal. Patient taking differently: Take 12.5 mg by mouth 2 (two) times daily with a meal. 12/16/19   Christian, Rylee, MD  hydrocerin (EUCERIN) CREA Apply 1 application topically 2 (two) times daily. 12/16/19   [name], MD  isosorbide mononitrate (IMDUR) 30 MG 24 hr tablet Take 0.5 tablets (15 mg total) by mouth daily. Patient taking differently: Take 30 mg by mouth daily. 12/17/19   [name], MD  Multiple Vitamins-Minerals (MULTIVITAMIN WITH MINERALS) tablet Take 1 tablet by mouth daily with breakfast.    [provider]  oxyCODONE-acetaminophen (PERCOCET) 5-325 MG tablet Take  1 tablet by mouth every 4 (four) hours as needed for severe pain. 07/11/22 07/11/23  Angelia Mould, MD  sevelamer carbonate (RENVELA) 800 MG tablet Take 2 tablets (1,600 mg total) by mouth 3 (three) times daily with meals. 12/16/19   [name], MD      Allergies    Tramadol    Review of Systems   Review of Systems  Physical Exam Updated Vital Signs BP 105/69   Pulse (!) 101   Temp 98.6 F (37 C) (Oral)   Resp 17   Ht 5' 3"$  (1.6 m)   Wt 73 kg   SpO2 93%   BMI 28.51 kg/m  Physical Exam Constitutional:      General: He is not in acute distress. HENT:     Head: Normocephalic and atraumatic.  Eyes:     Conjunctiva/sclera: Conjunctivae normal.     Pupils: Pupils are equal, round, and reactive to light.  Cardiovascular:     Rate and Rhythm: Normal rate and regular rhythm.  Pulmonary:     Effort: Pulmonary effort is normal. No respiratory distress.     Comments: 4 L nasal cannula, persistent coughing, rhonchi bilaterally in the lower lung fields.   Abdominal:     General: There is no distension.     Tenderness: There is no abdominal tenderness.  Musculoskeletal:     Right lower leg: No edema.     Left lower leg: No edema.  Comments: Left BKA  Skin:    General: Skin is warm and dry.  Neurological:     General: No focal deficit present.     Mental Status: He is alert. Mental status is at baseline.  Psychiatric:        Mood and Affect: Mood normal.        Behavior: Behavior normal.     ED Results / Procedures / Treatments   Labs (all labs ordered are listed, but only abnormal results are displayed) Labs Reviewed  COMPREHENSIVE METABOLIC PANEL - Abnormal; Notable for the following components:      Result Value   Chloride 90 (*)    Glucose, Bld 125 (*)    Creatinine, Ser 3.06 (*)    Calcium 8.5 (*)    Albumin 3.2 (*)    Total Bilirubin 1.4 (*)    GFR, Estimated 22 (*)    Anion gap 20 (*)    All other components within normal limits  LACTIC  ACID, PLASMA - Abnormal; Notable for the following components:   Lactic Acid, Venous 2.3 (*)    All other components within normal limits  CBC WITH DIFFERENTIAL/PLATELET - Abnormal; Notable for the following components:   WBC 17.2 (*)    RBC 3.08 (*)    Hemoglobin 9.9 (*)    HCT 30.9 (*)    MCV 100.3 (*)    RDW 15.9 (*)    nRBC 0.6 (*)    Neutro Abs 15.3 (*)    Abs Immature Granulocytes 0.15 (*)    All other components within normal limits  BRAIN NATRIURETIC PEPTIDE - Abnormal; Notable for the following components:   B Natriuretic Peptide 3,189.2 (*)    All other components within normal limits  CBG MONITORING, ED - Abnormal; Notable for the following components:   Glucose-Capillary 128 (*)    All other components within normal limits  CBG MONITORING, ED - Abnormal; Notable for the following components:   Glucose-Capillary 162 (*)    All other components within normal limits  RESP PANEL BY RT-PCR (RSV, FLU A&B, COVID)  RVPGX2  CULTURE, BLOOD (ROUTINE X 2)  CULTURE, BLOOD (ROUTINE X 2)  MRSA NEXT GEN BY PCR, NASAL  PROTIME-INR  LACTIC ACID, PLASMA  URINALYSIS, ROUTINE W REFLEX MICROSCOPIC  HIV ANTIBODY (ROUTINE TESTING W REFLEX)  COMPREHENSIVE METABOLIC PANEL  CBC WITH DIFFERENTIAL/PLATELET  HEMOGLOBIN A1C  LACTIC ACID, PLASMA  LACTIC ACID, PLASMA    EKG EKG Interpretation  Date/Time:  Thursday September 22 2022 16:23:16 EST Ventricular Rate:  111 PR Interval:  151 QRS Duration: 89 QT Interval:  347 QTC Calculation: 472 R Axis:   -60 Text Interpretation: Sinus tachycardia Markedly posterior QRS axis Confirmed by Octaviano Glow [phone]) on 09/22/2022 5:42:19 PM  Radiology DG Chest Port 1 View  Result Date: 09/22/2022 CLINICAL DATA:  Cough for several days EXAM: PORTABLE CHEST 1 VIEW COMPARISON:  12/11/2019 FINDINGS: Dialysis catheter has been removed in the interval. Cardiac shadow is stable. Right basilar airspace opacity is noted consistent with focal pneumonia.  Vascular stent is noted in the region of the left axillary vein. No bony abnormality is noted. IMPRESSION: Right basilar pneumonia. Electronically Signed   By: Inez Catalina M.D.   On: 09/22/2022 17:02    Procedures .Critical Care  Performed by: Wyvonnia Dusky, MD Authorized by: Wyvonnia Dusky, MD   Critical care provider statement:    Critical care time (minutes):  45   Critical care time was exclusive of:  Separately billable procedures and treating other patients   Critical care was necessary to treat or prevent imminent or life-threatening deterioration of the following conditions:  Sepsis   Critical care was time spent personally by me on the following activities:  Ordering and performing treatments and interventions, ordering and review of laboratory studies, ordering and review of radiographic studies, pulse oximetry, review of old charts, examination of patient and evaluation of patient's response to treatment     Medications Ordered in ED Medications  ceFEPIme (MAXIPIME) 1 g in sodium chloride 0.9 % 100 mL IVPB (has no administration in time range)  aspirin EC tablet 81 mg (has no administration in time range)  amLODipine (NORVASC) tablet 5 mg (has no administration in time range)  atorvastatin (LIPITOR) tablet 20 mg (has no administration in time range)  isosorbide mononitrate (IMDUR) 24 hr tablet 30 mg (has no administration in time range)  sevelamer carbonate (RENVELA) tablet 800 mg (has no administration in time range)  heparin injection 5,000 Units (5,000 Units Subcutaneous Given 09/22/22 2136)  ceFEPIme (MAXIPIME) 2 g in sodium chloride 0.9 % 100 mL IVPB (0 g Intravenous Stopped 09/22/22 1825)  vancomycin (VANCOREADY) IVPB 1750 mg/350 mL (0 mg Intravenous Stopped 09/22/22 2043)    ED Course/ Medical Decision Making/ A&P Clinical Course as of 09/22/22 2213  Thu Sep 22, 2022  1715 Concern for pneumonia patient meeting sepsis criteria.  I have ordered HCAP antibiotics  given his dialysis status.  We will be judicious with fluids at 150 cc/h at this time given his dialysis status.   [MT]    Clinical Course User Index [MT] Kelvin Burpee, Carola Rhine, MD                             Medical Decision Making Amount and/or Complexity of Data Reviewed Labs: ordered. Radiology: ordered.  Risk Prescription drug management. Decision regarding hospitalization.   This patient presents to the ED with concern for shortness of breath, cough, hypoxia. This involves an extensive number of treatment options, and is a complaint that carries with it a high risk of complications and morbidity.  The differential diagnosis includes pneumonia versus effusion versus congestive heart failure versus other  Co-morbidities that complicate the patient evaluation: History of congestive heart failure and renal disease at high risk of volume overload   Additional history obtained from EMS  External records from outside source obtained and reviewed including echocardiogram from 2021  I ordered and personally interpreted labs.  The pertinent results include: Leukocytosis white blood cell count 17.2.  CMP with an anion gap.  BNP elevated unclear if this is acute or chronic.  COVID and flu are negative.  I ordered imaging studies including x-ray of the chest I independently visualized and interpreted imaging which showed right lower lobe infiltrate I agree with the radiologist interpretation  The patient was maintained on a cardiac monitor.  I personally viewed and interpreted the cardiac monitored which showed an underlying rhythm of: Sinus tachycardia  Per my interpretation the patient's ECG shows no acute ischemic findings  I ordered medication including antibiotics for hospital-acquired pneumonia coverage, low rate fluid infusion given patient's dialysis status, for sepsis hydration.  I have reviewed the patients home medicines and have made adjustments as needed  Test Considered:  Low suspicion for acute PE in this clinical setting   After the interventions noted above, I reevaluated the patient and found that they have: stayed the same  Dispostion:  After consideration of the diagnostic results and the patients response to treatment, I feel that the patent would benefit from medical admission.  At this time the patient does not appear overtly volume overloaded and not seen indication for emergency dialysis.  I suspect this is likely illness related to pneumonia, and he would benefit from antibiotics and admission to the hospital.  He is maintaining his airway at this time and is not requiring BiPAP or intubation.         Final Clinical Impression(s) / ED Diagnoses Final diagnoses:  Hospital-acquired bacterial pneumonia    Rx / DC Orders ED Discharge Orders     None         Wyvonnia Dusky, MD 09/22/22 2213

## 2022-09-22 NOTE — Assessment & Plan Note (Addendum)
Weaned to 1L [place]. Breathing continues to improve. Consider sending home on oxygen. S/p Cefepime. - Monitor resp status, wean O2 as tolerated - SPO2 > 92% - Incentive spirometry -f/u [facility] labs, Ach receptor antibody

## 2022-09-22 NOTE — Assessment & Plan Note (Addendum)
UOP and weight stable. BP stable. Consider adding GDMT as outpatient per Cards. Repeat Echo outpatient. -Strict I&O -Daily weights

## 2022-09-22 NOTE — Assessment & Plan Note (Addendum)
ESRD on HD T/Th/Sat. - nephrology following, appreciate recs

## 2022-09-22 NOTE — ED Triage Notes (Signed)
Pt BIB from Dialysis complaining of lethargy. Patient has had a cough for 3 days, with the facility [name]) having done a chest x-ray with possible pneumonia diagnosis. With EMS, pt's O2 was 86% on RA. 93% on 4L [place]. Patient's biggest complaint is that he is having a cough. Normally, no oxygen requirement. L extremity restricted. A-febrile with dialysis.  EMS Vitals 168/90 36 RR EtCO2 26

## 2022-09-22 NOTE — ED Notes (Signed)
ED TO INPATIENT HANDOFF REPORT  ED Nurse Name and Phone #: [phone] Shandon Matson  S Name/Age/Gender [name] 67 y.o. male Room/Bed: 025C/025C  Code Status   Code Status: Full Code  Home/SNF/Other Home Patient oriented to: self, place, time, and situation Is this baseline? Yes   Triage Complete: Triage complete  Chief Complaint Sepsis [facility]) [A41.9]  Triage Note Pt BIB from Dialysis complaining of lethargy. Patient has had a cough for 3 days, with the facility [name]) having done a chest x-ray with possible pneumonia diagnosis. With EMS, pt's O2 was 86% on RA. 93% on 4L [place]. Patient's biggest complaint is that he is having a cough. Normally, no oxygen requirement. L extremity restricted. A-febrile with dialysis.  EMS Vitals 168/90 36 RR EtCO2 26    Allergies Allergies  Allergen Reactions   Tramadol Nausea And Vomiting    Pt states he took this on an empty stomach one time and it came right back up. He's not had it since.     Level of Care/Admitting Diagnosis ED Disposition     ED Disposition  Admit   Condition  --   [place]: [place] [[id number]]  Level of Care: Progressive [102]  Admit to Progressive based on following criteria: MULTISYSTEM THREATS such as stable sepsis, metabolic/electrolyte imbalance with or without encephalopathy that is responding to early treatment.  May place patient in observation at [facility] or [place] if equivalent level of care is available:: No  Covid Evaluation: Confirmed COVID Negative  Diagnosis: Sepsis [facility]) [id number]  Admitting Physician: Leslie Dales [id number]  Attending Physician: Leeanne Rio [[id number]          B Medical/Surgery History Past Medical History:  Diagnosis Date   CHF (congestive heart failure) ([place])    Chronic kidney disease    Diabetes mellitus without complication ([place])    PATIENT JUST LEARNED HE WAS DIABETIC   Hepatitis    Hyperlipemia    Hypertension     Hypokalemia    Hyponatremia    Necrotizing fasciitis ([place])    Pneumonia    HX OF PNA   Past Surgical History:  Procedure Laterality Date   AMPUTATION Left 09/11/2013   Procedure: AMPUTATION BELOW KNEE;  Surgeon: Marianna Payment, MD;  Location: [place];  Service: Orthopedics;  Laterality: Left;   AMPUTATION Right 12/18/2013   Procedure: RIGHT FOOT 1,2, TOE AMPUTATION  5th toe RAY AMPUTATION;  Surgeon: Marianna Payment, MD;  Location: [place];  Service: Orthopedics;  Laterality: Right;   AV FISTULA PLACEMENT Left 12/11/2019   Procedure: LEFT BRACHIOCEPHALIC FISTULA CREATION;  Surgeon: Rosetta Posner, MD;  Location: [place];  Service: Vascular;  Laterality: Left;   CHOLECYSTECTOMY     I & D EXTREMITY Left 09/03/2013   Procedure: IRRIGATION AND DEBRIDEMENT EXTREMITY;  Surgeon: Marianna Payment, MD;  Location: [place];  Service: Orthopedics;  Laterality: Left;   I & D EXTREMITY Left 09/11/2013   Procedure: LEFT FOOT IRRIGATION AND DEBRIDEMENT;  Surgeon: Marianna Payment, MD;  Location: [place];  Service: Orthopedics;  Laterality: Left;   INSERTION OF DIALYSIS CATHETER  12/11/2019   Procedure: Insertion Of Dialysis Catheter;  Surgeon: Rosetta Posner, MD;  Location: [place];  Service: Vascular;;   INSERTION OF DIALYSIS CATHETER Right 07/11/2022   Procedure: INSERTION OF TUNNELED DIALYSIS CATHETER;  Surgeon: Angelia Mould, MD;  Location: [facility] OR;  Service: Vascular;  Laterality: Right;   LEFT HEART CATH AND CORONARY ANGIOGRAPHY  N/A 12/13/2019   Procedure: LEFT HEART CATH AND CORONARY ANGIOGRAPHY;  Surgeon: Jettie Booze, MD;  Location: [place] [facility];  Service: Cardiovascular;  Laterality: N/A;   REVISON OF ARTERIOVENOUS FISTULA Left 07/11/2022   Procedure: REVISON OF LEFT ARM ARTERIOVENOUS FISTULA;  Surgeon: Angelia Mould, MD;  Location: [place];  Service: Vascular;  Laterality: Left;   RIGHT HEART CATH N/A 12/13/2019   Procedure: RIGHT HEART CATH;  Surgeon: Jettie Booze,  MD;  Location: [place] [facility];  Service: Cardiovascular;  Laterality: N/A;     A IV Location/Drains/Wounds Patient Lines/Drains/Airways Status     Active Line/Drains/Airways     Name Placement date Placement time Site Days   Peripheral IV 09/22/22 20 G Anterior;Proximal;Right Forearm 09/22/22  1631  Forearm  less than 1   Peripheral IV 09/22/22 20 G Right Antecubital 09/22/22  1749  Antecubital  less than 1   Fistula / Graft Left Upper arm Arteriovenous fistula 12/11/19  1414  Upper arm  1016   Hemodialysis Catheter Right Internal jugular Double lumen Permanent (Tunneled) 12/11/19  1330  Internal jugular  1016   Hemodialysis Catheter Right Internal jugular Double lumen Permanent (Tunneled) 07/11/22  1052  Internal jugular  73   Negative Pressure Wound Therapy Foot Right 12/18/13  --  --  3200   Incision (Closed) 10/15/13 Leg 10/15/13  2000  -- 3264   Incision (Closed) 12/18/13 Foot Right 12/18/13  1208  -- 3200   Incision (Closed) 12/11/19 Neck Right 12/11/19  1411  -- 1016   Incision (Closed) 12/11/19 Arm Left 12/11/19  1411  -- 1016   Incision (Closed) 07/11/22 Neck Right 07/11/22  1130  -- 73   Incision (Closed) 07/11/22 Arm Left 07/11/22  1130  -- 73   Pressure Injury 11/29/19 Sacrum Left Stage 2 -  Partial thickness loss of dermis presenting as a shallow open injury with a red, pink wound bed without slough. 11/29/19  1100  -- 1028   Pressure Injury 11/29/19 Sacrum Right Stage 2 -  Partial thickness loss of dermis presenting as a shallow open injury with a red, pink wound bed without slough. 11/29/19  1100  -- 1028   Wound / Incision (Open or Dehisced) 09/01/13 Other (Comment) Toe (Comment  which one) Right black necrotic 1st, 2nd, and 5th toes right foot 09/01/13  --  Toe (Comment  which one)  3308            Intake/Output Last 24 hours  Intake/Output Summary (Last 24 hours) at 09/22/2022 2055 Last data filed at 09/22/2022 2043 Gross per 24 hour  Intake 350 ml  Output --   Net 350 ml    Labs/Imaging Results for orders placed or performed during the hospital encounter of 09/22/22 (from the past 48 hour(s))  CBG monitoring, ED     Status: Abnormal   Collection Time: 09/22/22  4:21 PM  Result Value Ref Range   Glucose-Capillary 128 (H) 70 - 99 mg/dL    Comment: Glucose reference range applies only to samples taken after fasting for at least 8 hours.  Comprehensive metabolic panel     Status: Abnormal   Collection Time: 09/22/22  4:33 PM  Result Value Ref Range   Sodium 135 135 - 145 mmol/L   Potassium 3.5 3.5 - 5.1 mmol/L   Chloride 90 (L) 98 - 111 mmol/L   CO2 25 22 - 32 mmol/L   Glucose, Bld 125 (H) 70 - 99 mg/dL  Comment: Glucose reference range applies only to samples taken after fasting for at least 8 hours.   BUN 16 8 - 23 mg/dL   Creatinine, Ser 3.06 (H) 0.61 - 1.24 mg/dL   Calcium 8.5 (L) 8.9 - 10.3 mg/dL   Total Protein 7.4 6.5 - 8.1 g/dL   Albumin 3.2 (L) 3.5 - 5.0 g/dL   AST 35 15 - 41 U/L   ALT 19 0 - 44 U/L   Alkaline Phosphatase 62 38 - 126 U/L   Total Bilirubin 1.4 (H) 0.3 - 1.2 mg/dL   GFR, Estimated 22 (L) >60 mL/min    Comment: (NOTE) Calculated using the CKD-EPI Creatinine Equation (2021)    Anion gap 20 (H) 5 - 15    Comment: Performed at [facility], [place] [address] [place], [place] [address]  Lactic acid, plasma     Status: Abnormal   Collection Time: 09/22/22  4:33 PM  Result Value Ref Range   Lactic Acid, Venous 2.3 (HH) 0.5 - 1.9 mmol/L    Comment: CRITICAL RESULT CALLED TO, READ BACK BY AND VERIFIED WITH Otelia Limes [id number] 09/22/2022 WBOND Performed at [facility], [place] [address] [place], [place] [address]   CBC with Differential     Status: Abnormal   Collection Time: 09/22/22  4:33 PM  Result Value Ref Range   WBC 17.2 (H) 4.0 - 10.5 K/uL   RBC 3.08 (L) 4.22 - 5.81 MIL/uL   Hemoglobin 9.9 (L) 13.0 - 17.0 g/dL   HCT 30.9 (L) 39.0 - 52.0 %   MCV 100.3 (H) 80.0 - 100.0 fL   MCH 32.1 26.0 - 34.0 pg    MCHC 32.0 30.0 - 36.0 g/dL   RDW 15.9 (H) 11.5 - 15.5 %   Platelets 223 150 - 400 K/uL   nRBC 0.6 (H) 0.0 - 0.2 %   Neutrophils Relative % 89 %   Neutro Abs 15.3 (H) 1.7 - 7.7 K/uL   Lymphocytes Relative 6 %   Lymphs Abs 1.0 0.7 - 4.0 K/uL   Monocytes Relative 4 %   Monocytes Absolute 0.7 0.1 - 1.0 K/uL   Eosinophils Relative 0 %   Eosinophils Absolute 0.0 0.0 - 0.5 K/uL   Basophils Relative 0 %   Basophils Absolute 0.0 0.0 - 0.1 K/uL   Immature Granulocytes 1 %   Abs Immature Granulocytes 0.15 (H) 0.00 - 0.07 K/uL    Comment: Performed at [facility] [address] [place], [place] [address]  Protime-INR     Status: None   Collection Time: 09/22/22  4:33 PM  Result Value Ref Range   Prothrombin Time 13.5 11.4 - 15.2 seconds   INR 1.0 0.8 - 1.2    Comment: (NOTE) INR goal varies based on device and disease states. Performed at [facility], [place] [address] [place], [place] [address]   Brain natriuretic peptide     Status: Abnormal   Collection Time: 09/22/22  4:33 PM  Result Value Ref Range   B Natriuretic Peptide 3,189.2 (H) 0.0 - 100.0 pg/mL    Comment: Performed at [facility] [address] [place], [place] [address]  Resp panel by RT-PCR (RSV, Flu A&B, Covid) Peripheral     Status: None   Collection Time: 09/22/22  4:38 PM   Specimen: Peripheral; Nasal Swab  Result Value Ref Range   SARS Coronavirus 2 by RT PCR NEGATIVE NEGATIVE   Influenza A by PCR NEGATIVE  NEGATIVE   Influenza B by PCR NEGATIVE NEGATIVE    Comment: (NOTE) The Xpert Xpress SARS-CoV-2/FLU/RSV plus assay is intended as an aid in the diagnosis of influenza from Nasopharyngeal swab specimens and should not be used as a sole basis for treatment. Nasal washings and aspirates are unacceptable for Xpert Xpress SARS-CoV-2/FLU/RSV testing.  Fact Sheet for Patients: EntrepreneurPulse.com.au  Fact Sheet for Healthcare  Providers: IncredibleEmployment.be  This test is not yet approved or cleared by the [place] FDA and has been authorized for detection and/or diagnosis of SARS-CoV-2 by FDA under an Emergency Use Authorization (EUA). This EUA will remain in effect (meaning this test can be used) for the duration of the COVID-19 declaration under Section 564(b)(1) of the Act, 21 U.S.C. section 360bbb-3(b)(1), unless the authorization is terminated or revoked.     Resp Syncytial Virus by PCR NEGATIVE NEGATIVE    Comment: (NOTE) Fact Sheet for Patients: EntrepreneurPulse.com.au  Fact Sheet for Healthcare Providers: IncredibleEmployment.be  This test is not yet approved or cleared by the [place] FDA and has been authorized for detection and/or diagnosis of SARS-CoV-2 by FDA under an Emergency Use Authorization (EUA). This EUA will remain in effect (meaning this test can be used) for the duration of the COVID-19 declaration under Section 564(b)(1) of the Act, 21 U.S.C. section 360bbb-3(b)(1), unless the authorization is terminated or revoked.  Performed at [facility], [place] [address], [place], [place] [address]    DG Chest Port 1 View  Result Date: 09/22/2022 CLINICAL DATA:  Cough for several days EXAM: PORTABLE CHEST 1 VIEW COMPARISON:  12/11/2019 FINDINGS: Dialysis catheter has been removed in the interval. Cardiac shadow is stable. Right basilar airspace opacity is noted consistent with focal pneumonia. Vascular stent is noted in the region of the left axillary vein. No bony abnormality is noted. IMPRESSION: Right basilar pneumonia. Electronically Signed   By: Inez Catalina M.D.   On: 09/22/2022 17:02    Pending Labs Unresulted Labs (From admission, onward)     Start     Ordered   09/23/22 0500  Comprehensive metabolic panel  Tomorrow morning,   R        09/22/22 2049   09/23/22 0500  CBC with Differential/Platelet  Tomorrow  morning,   R        09/22/22 2049   09/23/22 0500  Hemoglobin A1c  Tomorrow morning,   R        09/22/22 2049   09/23/22 0000  Lactic acid, plasma  STAT Now then every 3 hours,   R      09/22/22 2049   09/22/22 2040  HIV Antibody (routine testing w rflx)  (HIV Antibody (Routine testing w reflex) panel)  Once,   R        09/22/22 2049   09/22/22 1719  MRSA Next Gen by PCR, Nasal  (MRSA Screening)  Once,   URGENT        09/22/22 1718   09/22/22 1624  Lactic acid, plasma  Now then every 2 hours,   R      09/22/22 1623   09/22/22 1624  Culture, blood (Routine x 2)  BLOOD CULTURE X 2,   R      09/22/22 1623   09/22/22 1624  Urinalysis, Routine w reflex microscopic -Urine, Clean Catch  Once,   URGENT       Question:  Specimen Source  Answer:  Urine, Clean Catch   09/22/22 1623  Vitals/Pain Today's Vitals   09/22/22 1845 09/22/22 1900 09/22/22 1930 09/22/22 2000  BP: (!) 108/57 115/76 112/78 93/81  Pulse: (!) 110 (!) 112 (!) 107 (!) 112  Resp: (!) 23 14 (!) 26 (!) 24  Temp:      TempSrc:      SpO2: 93% 95% 92% 91%  Weight:      Height:      PainSc:        Isolation Precautions No active isolations  Medications Medications  lactated ringers infusion ( Intravenous New Bag/Given 09/22/22 1755)  ceFEPIme (MAXIPIME) 1 g in sodium chloride 0.9 % 100 mL IVPB (has no administration in time range)  aspirin EC tablet 81 mg (has no administration in time range)  amLODipine (NORVASC) tablet 5 mg (has no administration in time range)  atorvastatin (LIPITOR) tablet 20 mg (has no administration in time range)  isosorbide mononitrate (IMDUR) 24 hr tablet 30 mg (has no administration in time range)  sevelamer carbonate (RENVELA) tablet 800 mg (has no administration in time range)  heparin injection 5,000 Units (has no administration in time range)  ceFEPIme (MAXIPIME) 2 g in sodium chloride 0.9 % 100 mL IVPB (0 g Intravenous Stopped 09/22/22 1825)  vancomycin (VANCOREADY) IVPB  1750 mg/350 mL (0 mg Intravenous Stopped 09/22/22 2043)    Mobility non-ambulatory     Focused Assessments Pulmonary Assessment Handoff:  Lung sounds:   O2 Device: Nasal Cannula O2 Flow Rate (L/min): 4 L/min    R Recommendations: See Admitting Provider Note  Report given to:   Additional Notes:

## 2022-09-23 ENCOUNTER — Observation Stay (HOSPITAL_COMMUNITY): Payer: Medicare Other

## 2022-09-23 DIAGNOSIS — E785 Hyperlipidemia, unspecified: Secondary | ICD-10-CM | POA: Diagnosis present

## 2022-09-23 DIAGNOSIS — I4891 Unspecified atrial fibrillation: Secondary | ICD-10-CM | POA: Diagnosis not present

## 2022-09-23 DIAGNOSIS — J9601 Acute respiratory failure with hypoxia: Secondary | ICD-10-CM | POA: Diagnosis present

## 2022-09-23 DIAGNOSIS — N186 End stage renal disease: Secondary | ICD-10-CM | POA: Diagnosis present

## 2022-09-23 DIAGNOSIS — Z992 Dependence on renal dialysis: Secondary | ICD-10-CM | POA: Diagnosis not present

## 2022-09-23 DIAGNOSIS — Z79899 Other long term (current) drug therapy: Secondary | ICD-10-CM | POA: Diagnosis not present

## 2022-09-23 DIAGNOSIS — R6520 Severe sepsis without septic shock: Secondary | ICD-10-CM | POA: Diagnosis present

## 2022-09-23 DIAGNOSIS — Z1152 Encounter for screening for COVID-19: Secondary | ICD-10-CM | POA: Diagnosis not present

## 2022-09-23 DIAGNOSIS — E1165 Type 2 diabetes mellitus with hyperglycemia: Secondary | ICD-10-CM | POA: Diagnosis present

## 2022-09-23 DIAGNOSIS — I2699 Other pulmonary embolism without acute cor pulmonale: Secondary | ICD-10-CM | POA: Diagnosis not present

## 2022-09-23 DIAGNOSIS — R0609 Other forms of dyspnea: Secondary | ICD-10-CM | POA: Diagnosis not present

## 2022-09-23 DIAGNOSIS — I5023 Acute on chronic systolic (congestive) heart failure: Secondary | ICD-10-CM | POA: Diagnosis not present

## 2022-09-23 DIAGNOSIS — I21A1 Myocardial infarction type 2: Secondary | ICD-10-CM | POA: Diagnosis present

## 2022-09-23 DIAGNOSIS — I480 Paroxysmal atrial fibrillation: Secondary | ICD-10-CM | POA: Diagnosis not present

## 2022-09-23 DIAGNOSIS — I959 Hypotension, unspecified: Secondary | ICD-10-CM | POA: Diagnosis not present

## 2022-09-23 DIAGNOSIS — K560 Paralytic ileus: Secondary | ICD-10-CM | POA: Diagnosis not present

## 2022-09-23 DIAGNOSIS — J189 Pneumonia, unspecified organism: Secondary | ICD-10-CM | POA: Diagnosis not present

## 2022-09-23 DIAGNOSIS — K567 Ileus, unspecified: Secondary | ICD-10-CM | POA: Diagnosis not present

## 2022-09-23 DIAGNOSIS — A419 Sepsis, unspecified organism: Secondary | ICD-10-CM | POA: Diagnosis present

## 2022-09-23 DIAGNOSIS — I82431 Acute embolism and thrombosis of right popliteal vein: Secondary | ICD-10-CM | POA: Diagnosis present

## 2022-09-23 DIAGNOSIS — D631 Anemia in chronic kidney disease: Secondary | ICD-10-CM | POA: Diagnosis present

## 2022-09-23 DIAGNOSIS — I824Z9 Acute embolism and thrombosis of unspecified deep veins of unspecified distal lower extremity: Secondary | ICD-10-CM | POA: Diagnosis not present

## 2022-09-23 DIAGNOSIS — I214 Non-ST elevation (NSTEMI) myocardial infarction: Secondary | ICD-10-CM | POA: Diagnosis not present

## 2022-09-23 DIAGNOSIS — I953 Hypotension of hemodialysis: Secondary | ICD-10-CM | POA: Diagnosis not present

## 2022-09-23 DIAGNOSIS — Z89512 Acquired absence of left leg below knee: Secondary | ICD-10-CM | POA: Diagnosis not present

## 2022-09-23 DIAGNOSIS — I43 Cardiomyopathy in diseases classified elsewhere: Secondary | ICD-10-CM | POA: Diagnosis present

## 2022-09-23 DIAGNOSIS — I25119 Atherosclerotic heart disease of native coronary artery with unspecified angina pectoris: Secondary | ICD-10-CM | POA: Diagnosis not present

## 2022-09-23 DIAGNOSIS — I5021 Acute systolic (congestive) heart failure: Secondary | ICD-10-CM | POA: Diagnosis present

## 2022-09-23 DIAGNOSIS — I132 Hypertensive heart and chronic kidney disease with heart failure and with stage 5 chronic kidney disease, or end stage renal disease: Secondary | ICD-10-CM | POA: Diagnosis present

## 2022-09-23 DIAGNOSIS — E1151 Type 2 diabetes mellitus with diabetic peripheral angiopathy without gangrene: Secondary | ICD-10-CM | POA: Diagnosis present

## 2022-09-23 DIAGNOSIS — I2510 Atherosclerotic heart disease of native coronary artery without angina pectoris: Secondary | ICD-10-CM | POA: Diagnosis not present

## 2022-09-23 DIAGNOSIS — J159 Unspecified bacterial pneumonia: Secondary | ICD-10-CM | POA: Diagnosis present

## 2022-09-23 DIAGNOSIS — Y95 Nosocomial condition: Secondary | ICD-10-CM | POA: Diagnosis present

## 2022-09-23 DIAGNOSIS — E1122 Type 2 diabetes mellitus with diabetic chronic kidney disease: Secondary | ICD-10-CM | POA: Diagnosis present

## 2022-09-23 DIAGNOSIS — I358 Other nonrheumatic aortic valve disorders: Secondary | ICD-10-CM | POA: Diagnosis present

## 2022-09-23 LAB — CBC WITH DIFFERENTIAL/PLATELET
Abs Immature Granulocytes: 0.09 10*3/uL — ABNORMAL HIGH (ref 0.00–0.07)
Basophils Absolute: 0 10*3/uL (ref 0.0–0.1)
Basophils Relative: 0 %
Eosinophils Absolute: 0 10*3/uL (ref 0.0–0.5)
Eosinophils Relative: 0 %
HCT: 26.2 % — ABNORMAL LOW (ref 39.0–52.0)
Hemoglobin: 8.5 g/dL — ABNORMAL LOW (ref 13.0–17.0)
Immature Granulocytes: 1 %
Lymphocytes Relative: 5 %
Lymphs Abs: 0.6 10*3/uL — ABNORMAL LOW (ref 0.7–4.0)
MCH: 32.7 pg (ref 26.0–34.0)
MCHC: 32.4 g/dL (ref 30.0–36.0)
MCV: 100.8 fL — ABNORMAL HIGH (ref 80.0–100.0)
Monocytes Absolute: 0.5 10*3/uL (ref 0.1–1.0)
Monocytes Relative: 4 %
Neutro Abs: 12 10*3/uL — ABNORMAL HIGH (ref 1.7–7.7)
Neutrophils Relative %: 90 %
Platelets: 180 10*3/uL (ref 150–400)
RBC: 2.6 MIL/uL — ABNORMAL LOW (ref 4.22–5.81)
RDW: 15.9 % — ABNORMAL HIGH (ref 11.5–15.5)
WBC: 13.3 10*3/uL — ABNORMAL HIGH (ref 4.0–10.5)
nRBC: 0.5 % — ABNORMAL HIGH (ref 0.0–0.2)

## 2022-09-23 LAB — BLOOD GAS, VENOUS
Acid-Base Excess: 1.5 mmol/L (ref 0.0–2.0)
Bicarbonate: 25.9 mmol/L (ref 20.0–28.0)
Drawn by: 4653
O2 Saturation: 72.2 %
Patient temperature: 37
pCO2, Ven: 39 mmHg — ABNORMAL LOW (ref 44–60)
pH, Ven: 7.43 (ref 7.25–7.43)
pO2, Ven: 43 mmHg (ref 32–45)

## 2022-09-23 LAB — MAGNESIUM: Magnesium: 2.1 mg/dL (ref 1.7–2.4)

## 2022-09-23 LAB — PHOSPHORUS: Phosphorus: 5 mg/dL — ABNORMAL HIGH (ref 2.5–4.6)

## 2022-09-23 LAB — COMPREHENSIVE METABOLIC PANEL
ALT: 16 U/L (ref 0–44)
AST: 26 U/L (ref 15–41)
Albumin: 2.5 g/dL — ABNORMAL LOW (ref 3.5–5.0)
Alkaline Phosphatase: 51 U/L (ref 38–126)
Anion gap: 24 — ABNORMAL HIGH (ref 5–15)
BUN: 28 mg/dL — ABNORMAL HIGH (ref 8–23)
CO2: 20 mmol/L — ABNORMAL LOW (ref 22–32)
Calcium: 7.9 mg/dL — ABNORMAL LOW (ref 8.9–10.3)
Chloride: 93 mmol/L — ABNORMAL LOW (ref 98–111)
Creatinine, Ser: 4.51 mg/dL — ABNORMAL HIGH (ref 0.61–1.24)
GFR, Estimated: 14 mL/min — ABNORMAL LOW (ref >60)
Glucose, Bld: 173 mg/dL — ABNORMAL HIGH (ref 70–99)
Potassium: 3.9 mmol/L (ref 3.5–5.1)
Sodium: 137 mmol/L (ref 135–145)
Total Bilirubin: 1.8 mg/dL — ABNORMAL HIGH (ref 0.3–1.2)
Total Protein: 6.2 g/dL — ABNORMAL LOW (ref 6.5–8.1)

## 2022-09-23 LAB — GLUCOSE, CAPILLARY
Glucose-Capillary: 148 mg/dL — ABNORMAL HIGH (ref 70–99)
Glucose-Capillary: 132 mg/dL — ABNORMAL HIGH (ref 70–99)
Glucose-Capillary: 132 mg/dL — ABNORMAL HIGH (ref 70–99)

## 2022-09-23 LAB — TROPONIN I (HIGH SENSITIVITY)
Troponin I (High Sensitivity): 5494 ng/L (ref <18)
Troponin I (High Sensitivity): 6320 ng/L (ref <18)

## 2022-09-23 LAB — ECHOCARDIOGRAM COMPLETE
AR max vel: 1.95 cm2
AV Area VTI: 2 cm2
AV Area mean vel: 1.88 cm2
AV Mean grad: 4 mmHg
AV Peak grad: 7.7 mmHg
Ao pk vel: 1.39 m/s
Area-P 1/2: 7.59 cm2
Height: 63 in
S' Lateral: 2.9 cm
Weight: 2582.03 oz

## 2022-09-23 LAB — HEMOGLOBIN A1C
Hgb A1c MFr Bld: 5.6 % (ref 4.8–5.6)
Mean Plasma Glucose: 114.02 mg/dL

## 2022-09-23 LAB — HEPARIN LEVEL (UNFRACTIONATED): Heparin Unfractionated: 0.21 IU/mL — ABNORMAL LOW (ref 0.30–0.70)

## 2022-09-23 LAB — HIV ANTIBODY (ROUTINE TESTING W REFLEX): HIV Screen 4th Generation wRfx: NONREACTIVE

## 2022-09-23 LAB — HEPATITIS B SURFACE ANTIGEN: Hepatitis B Surface Ag: NONREACTIVE

## 2022-09-23 MED ORDER — CHLORHEXIDINE GLUCONATE CLOTH 2 % EX PADS
6.0000 | MEDICATED_PAD | Freq: Every day | CUTANEOUS | Status: DC
  Administered 2022-09-24 – 2022-09-25 (×2): 6 via TOPICAL

## 2022-09-23 MED ORDER — HEPARIN (PORCINE) 25000 UT/250ML-% IV SOLN
1150.0000 [IU]/h | INTRAVENOUS | Status: AC
  Administered 2022-09-23: 850 [IU]/h via INTRAVENOUS
  Administered 2022-09-24 – 2022-09-25 (×2): 1150 [IU]/h via INTRAVENOUS
  Filled 2022-09-23 (×3): qty 250

## 2022-09-23 MED ORDER — PERFLUTREN LIPID MICROSPHERE
1.0000 mL | INTRAVENOUS | Status: AC | PRN
  Administered 2022-09-23: 6 mL via INTRAVENOUS

## 2022-09-23 MED ORDER — CHLORHEXIDINE GLUCONATE CLOTH 2 % EX PADS
6.0000 | MEDICATED_PAD | Freq: Every day | CUTANEOUS | Status: DC
  Administered 2022-09-23 – 2022-09-26 (×4): 6 via TOPICAL

## 2022-09-23 MED ORDER — CALCITRIOL 0.5 MCG PO CAPS
0.5000 ug | ORAL_CAPSULE | ORAL | Status: DC
  Administered 2022-09-27 – 2022-10-06 (×5): 0.5 ug via ORAL
  Filled 2022-09-23 (×6): qty 1

## 2022-09-23 MED ORDER — METOPROLOL SUCCINATE ER 25 MG PO TB24
25.0000 mg | ORAL_TABLET | Freq: Every day | ORAL | Status: DC
  Administered 2022-09-23 – 2022-09-28 (×6): 25 mg via ORAL
  Filled 2022-09-23 (×6): qty 1

## 2022-09-23 MED ORDER — IOHEXOL 350 MG/ML SOLN
65.0000 mL | Freq: Once | INTRAVENOUS | Status: AC | PRN
  Administered 2022-09-23: 65 mL via INTRAVENOUS

## 2022-09-23 NOTE — Plan of Care (Signed)

## 2022-09-23 NOTE — Progress Notes (Signed)
Echocardiogram 2D Echocardiogram has been performed.  [name] 09/23/2022, 12:31 PM

## 2022-09-23 NOTE — Progress Notes (Signed)
Daily Progress Note Intern Pager: [phone]  Patient name: [name] Medical record number: [id number] Date of birth: October 25, 1955 Age: 67 y.o. Gender: male  Primary Care Provider: Patient, No Pcp Per Consultants: None Code Status: Full code which was confirmed with patient Preferred Emergency Contact: Doris (however they are not on talking terms in the last 7 years)  Pt Overview and Major Events to Date:  2/22 - admitted  Assessment and Plan: [name] is a 67 y.o. male presenting with cough and SOB.  Past medical history includes ESRD on HD T/TH/Sat, heart failure, well-controlled T2DM, PAD, unilateral BKA, hyperparathyroidism, hypertension.   * Sepsis ([place]) On admission patient meeting 3/4 SIRS criteria with known source of infection (R basilar pneumonia on xray). WBC down from 17.2 to 13.3. MRSA swab negative. Received vancomycin and cefepime in ED. Today he is still tachycardic, tachypneic - Admit to FMTS, progressive, attending Dr. Ardelia Mems - Continuous cardiac monitoring - Incentive spirometry - Encourage p.o. hydration - f/u blood cultures x2 - continue cefipime  Pneumonia R basilar pneumonia on CXR. Continues to be tachypneic with increased oxygen requirements (90% on 6L). CTA PE ordered - RT eval and treat - f/u troponin, EKG - f/u CTA PE  ESRD on dialysis [facility]) ESRD on HD T/TH/sat, received dialysis prior to admission (2/22). - nephrology following, appreciate recs  Heart failure, unspecified ([place]) On admission BNP 3189. Last echo 04/2020 showed LVEF of 50-55%, with grade 2 diastolic dysfunction and elevated left atrial pressures.  - AM CMP - Strict I's and O's - daily weights - f/u echocardiogram  Type 2 diabetes mellitus, controlled ([place]) Last visible A1c in chart is from April 2021, of 5.5. Glucose in 100s-200s - CBG 4 times daily before meals and bedtime  Other chronic medical conditions PAD: Continue aspirin 81 mg daily, atorvastatin 20 mg  daily HTN: held antihypertensives due to hypotension amlodipine 5 mg daily, Imdur 30 mg daily; hydralazine  FEN/GI: Renal diet, fluid restriction 1200 ml VTE Prophylaxis: Heparin   Disposition: Progressive  Subjective:  Denies shortness of breath but says he still has a cough. He feels significantly better compared to when he was admitted. He reports he does not get food  Objective: Temp:  [98 F (36.7 C)-99.1 F (37.3 C)] 98.2 F (36.8 C) (02/23 1117) Pulse Rate:  [97-114] 103 (02/23 1159) Resp:  [0-28] 18 (02/23 1159) BP: (84-132)/(47-109) 120/70 (02/23 1117) SpO2:  [85 %-96 %] 96 % (02/23 1159) Weight:  [73 kg-73.2 kg] 73.2 kg (02/23 0100) Physical Exam: General: in no acute distress and well-appearing HEENT: normocephalic and atraumatic Respiratory: clear to auscultation bilaterally posteriorly and tachypneic on 4L nasal cannula Extremities: moving all extremities spontaneously Gastrointestinal: non-tender and non-distended Cardiovascular: tachycardic  Laboratory: Most recent CBC Lab Results  Component Value Date   WBC 13.3 (H) 09/23/2022   HGB 8.5 (L) 09/23/2022   HCT 26.2 (L) 09/23/2022   MCV 100.8 (H) 09/23/2022   PLT 180 09/23/2022   Most recent BMP    Latest Ref Rng & Units 09/23/2022    1:26 AM  BMP  Glucose 70 - 99 mg/dL 173   BUN 8 - 23 mg/dL 28   Creatinine 0.61 - 1.24 mg/dL 4.51   Sodium 135 - 145 mmol/L 137   Potassium 3.5 - 5.1 mmol/L 3.9   Chloride 98 - 111 mmol/L 93   CO2 22 - 32 mmol/L 20   Calcium 8.9 - 10.3 mg/dL 7.9    Camelia Phenes, MD 09/23/2022, 12:49 PM  PGY-1, [place] Intern pager: [phone], text pages welcome Secure chat group [email or web address]

## 2022-09-23 NOTE — Hospital Course (Addendum)
[name] is a 67 y.o. male presenting with cough and SOB.  Past medical history includes ESRD on HD T/TH/Sat, heart failure, well-controlled T2DM, PAD, unilateral BKA, hyperparathyroidism, and hypertension.  Pneumonia  Respiratory rate 28, heart rate 113, and WBC 17.2 on presentation with new oxygen requirement. R basilar pneumonia on CXR. Patient continues to be tachypneic with increased oxygen requirements. While hospitalized, patient was on HFNC and intermittently on BIPAP. There was suspicion for PE so CTA PE was ordered which showed no PE, but was concerning for changes from possible prior PE (Web-like area in left lower lobe, w/ multifocal PNA) and recommended DVT US (see bleow). Patient was continued on cefipime for PNA until ***. MRSA swab negative. Blood cultures were *** for over 72 hours. At time of discharge  Hypotension Afib with RVR Paged by nursing staff because he was hypotensive after dialysis with some nausea. Blood pressure of 60/40 at time of [place], increased to 90/60 during examination. Tachycardic between 110-130. EKG showed afib with RVR. Suspect hypotension may be due to overdiuresis (1900 mL fluid removed) which then triggered afib with RVR. Given 250 mL bolus and cardiology consulted. He was started on IV amiodarone. He was already on eliquis for his DVT.   ESRD on dialysis  T/Th/Sat schedule. He received dialysis prior to admission and was followed by nephrology during hospitalization.   Heart failure On admission BNP 3189. Last echo 04/2020 showed LVEF of 50-55%, with grade 2 diastolic dysfunction and elevated left atrial pressures. Repeat echo was ordered which showed EF 30 - 35% with severe hypokinesis of the mid-to-apical septal, mid-to-apical anterior, and mid-to-apical inferior LV segments and apex.  NSTEMI Patient found to have NSTEMI during admission. EKG and troponin's obtained 2/2 hx of CAD. Patient found to have elevated trops 5,494 > 6,320. Cardiology  consulted and patient was started on 48 hr heparin gtt. Patient remained hemodynamically stable and reported no chest pain.  DVT Patient noted to have DVT in right popliteal vein after recommendation for f/u imaging following CT PE scan. Patient at that time had been started on heparin gtt for NSTEMI. Patient was transitioned to apixaban.  Ileus Noted to have abdominal distention on 09/29/2022 with abdominal pain and nausea.  KUB ordered which showed significant gas burden C/F ileus. NGT placed and patient made NPO. CT abdomen without contrast ordered. Repeat KUB negative for bowel obstruction. He was trialed on NGT removal with bowel rest and given rectal bowel regimen.   Urinary retention Noted to have urinary retention with 83m on bladder scan. After I/O cath, bladder scan with 023m Also some concern for Lambert-Eaton or myasthenia gravis given weakness, constipation, respiratory failure. Labs were ordered which showed ***.   Follow up recommendations Cardiology plans to repeat TTE to reassess EF and for ischemic evaluation outpatient.

## 2022-09-23 NOTE — Progress Notes (Signed)
RT called to room due to Pt sats in the 80s on 6L [place]. RT placed Pt on 8L HFNC ([place]) with sats currently at 96%. Will continue to titrate O2 as needed.

## 2022-09-23 NOTE — Progress Notes (Signed)
Patient arrives on unit accompanied by ED RN, via stretcher. A&Ox4, verbalizing understanding of POC and unit orientation. X3 assist to transfer from stretcher to bed. Physical assessment complete; see flowsheet for details. VSS, O2 saturations suboptimal at 88% on supplemental O2 via [place] 4L. Four eyes verified with second RN. Bed locked and lowered, call bell and necessities within reach, will continue to monitor

## 2022-09-23 NOTE — Progress Notes (Addendum)
FMTS Interim Progress Note  S:Went to bedside with Dr. Jerilee Hoh - patient appears sleepy but says he is feeling OK. Denies any chest pain.   O: BP 135/73 (BP Location: Right Arm)   Pulse (!) 109   Temp 98 F (36.7 C) (Oral)   Resp 20   Ht '5\' 3"'$  (1.6 m)   Wt 73.2 kg   SpO2 91%   BMI 28.59 kg/m    Gen: tired appearing but awake and responsive to questions Resp: some subcostal retractions, saturing 88% on 11 L > titrated up to 12 L satting at 90%  A/P: Acute Respiratory Failure  VBG without acidosis or hypercarbia but patient appears to have work of breathing and some sleepiness. -will reach out to RT, consider BiPAP for work of breathing -consider PCCM consult if not improving  Gerrit Heck, MD 09/23/2022, 5:24 PM PGY-2, [facility] Service pager [phone]

## 2022-09-23 NOTE — Consult Note (Addendum)
Cardiology Consultation   Patient ID: [name] MRN: [id number]; DOB: [birth year]  Admit date: 09/22/2022 Date of Consult: 09/23/2022  PCP:  Patient, No Pcp Per   [facility] Prov[facility] Cardiologist:  Ian Munroe, MD        History of Present Illness:   [name] is being seen at the request of Dr. Ardelia [name] for the evaluation of reduced ejection fraction.  Has known moderate to severe underlying coronary artery disease with no prior PCI, 2021 catheterization reviewed.  Prior ejection fraction in early 2021 was 30 to 35% much like is seen today.  He did have a period of increased ejection fraction of 50 to 55% in late 2021.  Currently resting fairly comfortably in bed, nasal cannula oxygen.  Cough noted.  Last hemodialysis session was yesterday, Thursday.     Past Medical History:  Diagnosis Date   CHF (congestive heart failure) (HCC)    Chronic kidney disease    Diabetes mellitus without complication ([place])    PATIENT JUST LEARNED HE WAS DIABETIC   Hepatitis    Hyperlipemia    Hypertension    Hypokalemia    Hyponatremia    Necrotizing fasciitis ([place])    Pneumonia    HX OF PNA    Past Surgical History:  Procedure Laterality Date   AMPUTATION Left 09/11/2013   Procedure: AMPUTATION BELOW KNEE;  Surgeon: Marianna Payment, MD;  Location: [facility] [place];  Service: Orthopedics;  Laterality: Left;   AMPUTATION Right 12/18/2013   Procedure: RIGHT FOOT 1,2, TOE AMPUTATION  5th toe RAY AMPUTATION;  Surgeon: Marianna Payment, MD;  Location: [facility];  Service: Orthopedics;  Laterality: Right;   AV FISTULA PLACEMENT Left 12/11/2019   Procedure: LEFT BRACHIOCEPHALIC FISTULA CREATION;  Surgeon: Rosetta Posner, MD;  Location: [facility];  Service: Vascular;  Laterality: Left;   CHOLECYSTECTOMY     I & D EXTREMITY Left 09/03/2013   Procedure: IRRIGATION AND DEBRIDEMENT EXTREMITY;  Surgeon: Marianna Payment, MD;  Location: [place];  Service: Orthopedics;  Laterality: Left;   I & D  EXTREMITY Left 09/11/2013   Procedure: LEFT FOOT IRRIGATION AND DEBRIDEMENT;  Surgeon: Marianna Payment, MD;  Location: [facility];  Service: Orthopedics;  Laterality: Left;   INSERTION OF DIALYSIS CATHETER  12/11/2019   Procedure: Insertion Of Dialysis Catheter;  Surgeon: Rosetta Posner, MD;  Location: [place];  Service: Vascular;;   INSERTION OF DIALYSIS CATHETER Right 07/11/2022   Procedure: INSERTION OF TUNNELED DIALYSIS CATHETER;  Surgeon: Angelia Mould, MD;  Location: [facility];  Service: Vascular;  Laterality: Right;   LEFT HEART CATH AND CORONARY ANGIOGRAPHY N/A 12/13/2019   Procedure: LEFT HEART CATH AND CORONARY ANGIOGRAPHY;  Surgeon: Jettie Booze, MD;  Location: [facility];  Service: Cardiovascular;  Laterality: N/A;   REVISON OF ARTERIOVENOUS FISTULA Left 07/11/2022   Procedure: REVISON OF LEFT ARM ARTERIOVENOUS FISTULA;  Surgeon: Angelia Mould, MD;  Location: [facility];  Service: Vascular;  Laterality: Left;   RIGHT HEART CATH N/A 12/13/2019   Procedure: RIGHT HEART CATH;  Surgeon: Jettie Booze, MD;  Location: [facility];  Service: Cardiovascular;  Laterality: N/A;     Home Medications:  Prior to Admission medications   Medication Sig Start Date End Date Taking? Authorizing Provider  amLODipine (NORVASC) 5 MG tablet Take 5 mg by mouth daily. 07/22/21   [provider]  aspirin EC 81 MG tablet Take 81 mg by mouth daily. Swallow  whole.    [provider]  atorvastatin (LIPITOR) 20 MG tablet Take 1 tablet (20 mg total) by mouth daily. 12/17/19   [name], MD  carvedilol (COREG) 25 MG tablet Take 1 tablet (25 mg total) by mouth 2 (two) times daily with a meal. Patient taking differently: Take 12.5 mg by mouth 2 (two) times daily with a meal. 12/16/19   Christian, Rylee, MD  hydrocerin (EUCERIN) CREA Apply 1 application topically 2 (two) times daily. 12/16/19   [name], MD  isosorbide mononitrate (IMDUR) 30 MG 24 hr tablet  Take 0.5 tablets (15 mg total) by mouth daily. Patient taking differently: Take 30 mg by mouth daily. 12/17/19   [name], MD  Multiple Vitamins-Minerals (MULTIVITAMIN WITH MINERALS) tablet Take 1 tablet by mouth daily with breakfast.    [provider]  oxyCODONE-acetaminophen (PERCOCET) 5-325 MG tablet Take 1 tablet by mouth every 4 (four) hours as needed for severe pain. 07/11/22 07/11/23  Angelia Mould, MD  sevelamer carbonate (RENVELA) 800 MG tablet Take 2 tablets (1,600 mg total) by mouth 3 (three) times daily with meals. 12/16/19   [name], MD    Inpatient Medications: Scheduled Meds:  aspirin EC  81 mg Oral Daily   atorvastatin  20 mg Oral Daily   Chlorhexidine Gluconate Cloth  6 each Topical Daily   heparin  5,000 Units Subcutaneous Q8H   sevelamer carbonate  800 mg Oral TID WC   Continuous Infusions:  ceFEPime (MAXIPIME) IV     PRN Meds: perflutren lipid microspheres (DEFINITY) IV suspension  Allergies:    Allergies  Allergen Reactions   Tramadol Nausea And Vomiting    Pt states he took this on an empty stomach one time and it came right back up. He's not had it since.     Social History:   Social History   Socioeconomic History   Marital status: Single    Spouse name: Not on file   Number of children: Not on file   Years of education: Not on file   Highest education level: Not on file  Occupational History   Not on file  Tobacco Use   Smoking status: Never   Smokeless tobacco: Never  Vaping Use   Vaping Use: Never used  Substance and Sexual Activity   Alcohol use: No   Drug use: No   Sexual activity: Not on file  Other Topics Concern   Not on file  Social History Narrative   ** Merged History Encounter **       Social Determinants of Health   Financial Resource Strain: Not on file  Food Insecurity: No Food Insecurity (09/23/2022)   Hunger Vital Sign    Worried About Running Out of Food in the Last Year: Never true     Ran Out of Food in the Last Year: Never true  Transportation Needs: No Transportation Needs (09/23/2022)   PRAPARE - Hydrologist (Medical): No    Lack of Transportation (Non-Medical): No  Physical Activity: Not on file  Stress: Not on file  Social Connections: Not on file  Intimate Partner Violence: Not At Risk (09/23/2022)   Humiliation, Afraid, Rape, and Kick questionnaire    Fear of Current or Ex-Partner: No    Emotionally Abused: No    Physically Abused: No    Sexually Abused: No    Family History:    Family History  Problem Relation Age of Onset   Diabetes type II Mother  Dementia Mother    Heart disease Father      ROS:  Please see the history of present illness.  Denies any fevers chills All other ROS reviewed and negative.     Physical Exam/Data:   Vitals:   09/23/22 0904 09/23/22 0937 09/23/22 1117 09/23/22 1159  BP:  115/63 120/70   Pulse:  (!) 108 (!) 102 (!) 103  Resp:  (!) '22 17 18  '$ Temp:  98.2 F (36.8 C) 98.2 F (36.8 C)   TempSrc:  Oral Oral   SpO2: 91%  90% 96%  Weight:      Height:        Intake/Output Summary (Last 24 hours) at 09/23/2022 1358 Last data filed at 09/23/2022 [id number] Gross per 24 hour  Intake 600 ml  Output --  Net 600 ml      09/23/2022    1:00 AM 09/22/2022    4:20 PM 07/08/2022   10:11 AM  Last 3 Weights  Weight (lbs) 161 lb 6 oz 160 lb 15 oz 160 lb  Weight (kg) 73.2 kg 73 kg 72.576 kg     Body mass index is 28.59 kg/m.  General: No acute distress HEENT: normal Neck: no JVD Vascular: No carotid bruits; Distal pulses 2+ bilaterally Cardiac:  normal S1, S2; RRR; no murmur  Lungs:  clear to auscultation bilaterally, no wheezing, rhonchi or rales  Abd: soft, nontender, no hepatomegaly  Ext: no edema Musculoskeletal: BKA  Skin: warm and dry  Neuro:  CNs 2-12 intact, no focal abnormalities noted Psych:  Normal affect   Relevant CV Studies: Cardiac catheterization 12/29/2019 RPDA lesion  is 50% stenosed. Mid LM to Dist LM lesion is 50% stenosed. Prox Cx to Mid Cx lesion is 100% stenosed. Mid LAD lesion is 75% stenosed. Dist LAD-1 lesion is 50% stenosed. Dist LAD-2 lesion is 90% stenosed. LV end diastolic pressure is moderately elevated. There is no aortic valve stenosis. Diffuse CAD. He denied angina. He has had DOE. Hemodynamic findings consistent with moderate pulmonary hypertension. Ao sat 95%, PA sat 62%, mean PA pressure 36 mm Hg; mean PCWP 23 mm Hg; CO 7.0 L/min; CI 4   Moderate CAD.  Cardiomyopathy seems out of proportion to CAD, but ischemia may be a component of the LV dysfunction.     Elevated LVEDP.  Patient lying on a wedge due to shortness of breath.  Continue medical therapy for LV dysfunction.     He did not want any family notified.   Laboratory Data:  High Sensitivity Troponin:  No results for input(s): "TROPONINIHS" in the last 720 hours.   Chemistry Recent Labs  Lab 09/22/22 1633 09/23/22 0120 09/23/22 0126  NA 135  --  137  K 3.5  --  3.9  CL 90*  --  93*  CO2 25  --  20*  GLUCOSE 125*  --  173*  BUN 16  --  28*  CREATININE 3.06*  --  4.51*  CALCIUM 8.5*  --  7.9*  MG  --  2.1  --   GFRNONAA 22*  --  14*  ANIONGAP 20*  --  24*    Recent Labs  Lab 09/22/22 1633 09/23/22 0126  PROT 7.4 6.2*  ALBUMIN 3.2* 2.5*  AST 35 26  ALT 19 16  ALKPHOS 62 51  BILITOT 1.4* 1.8*   Lipids No results for input(s): "CHOL", "TRIG", "HDL", "LABVLDL", "LDLCALC", "CHOLHDL" in the last 168 hours.  Hematology Recent Labs  Lab 09/22/22 1633 09/23/22  0126  WBC 17.2* 13.3*  RBC 3.08* 2.60*  HGB 9.9* 8.5*  HCT 30.9* 26.2*  MCV 100.3* 100.8*  MCH 32.1 32.7  MCHC 32.0 32.4  RDW 15.9* 15.9*  PLT 223 180   Thyroid No results for input(s): "TSH", "FREET4" in the last 168 hours.  BNP Recent Labs  Lab 09/22/22 1633  BNP 3,189.2*    DDimer No results for input(s): "DDIMER" in the last 168 hours.   Radiology/Studies:  CT Angio Chest Pulmonary  Embolism (PE) W or WO Contrast  Result Date: 09/23/2022 CLINICAL DATA:  67 year old male presents for evaluation of the chest, suspicion for pulmonary embolism. EXAM: CT ANGIOGRAPHY CHEST WITH CONTRAST TECHNIQUE: Multidetector CT imaging of the chest was performed using the standard protocol during bolus administration of intravenous contrast. Multiplanar CT image reconstructions and MIPs were obtained to evaluate the vascular anatomy. RADIATION DOSE REDUCTION: This exam was performed according to the departmental dose-optimization program which includes automated exposure control, adjustment of the mA and/or kV according to patient size and/or use of iterative reconstruction technique. CONTRAST:  27m OMNIPAQUE IOHEXOL 350 MG/ML SOLN COMPARISON:  Chest radiograph September 22, 2022 FINDINGS: Cardiovascular: Aortic atherosclerosis both calcified and noncalcified is mild. Aorta is not aneurysmal. Bovine arch branching pattern. The aorta is otherwise not well assessed due to phase of bolus timing. Heart size moderately enlarged. No pericardial effusion or nodularity. Signs of coronary artery disease. Central pulmonary arteries are opacified to 297 Hounsfield units. Study is negative for acute pulmonary embolism to the central segmental level. Subsegmental branches with limited assessment particular in the mid chest due to motion related artifact. Web-like area in LEFT lower lobe pulmonary artery seen on image 144/6 passing through the pulmonary artery is favored to represent sequela of prior embolism. Mediastinum/Nodes: No signs of adenopathy in the chest. No acute process in the mediastinum. Esophagus grossly normal. Lungs/Pleura: Patchy bilateral airspace disease and basilar consolidation associated with large RIGHT and moderately large LEFT sided pleural effusions. Septal thickening. Airways are patent at the larger airway level, not well evaluated beyond the segmental level. Upper Abdomen: Incidental imaging of  upper abdominal contents without acute process. Musculoskeletal: No acute musculoskeletal process or destructive bone finding. Review of the MIP images confirms the above findings. IMPRESSION: 1. Study is negative for acute pulmonary embolism to the central segmental level. Beyond this level the study is limited by motion artifact particularly in the mid chest. 2. Web-like area in LEFT lower lobe pulmonary artery seen passing through the pulmonary artery is favored to represent sequela of prior embolism. Correlation with Doppler assessment may be helpful to determine whether there may be DVT it might change management. 3. Findings suspicious for multifocal pneumonia associated with bilateral pleural effusions. Concomitant heart failure is also considered. 4. Signs of coronary artery disease. 5. Aortic atherosclerosis. Aortic Atherosclerosis (ICD10-I70.0). These results will be called to the ordering clinician or representative by the Radiologist Assistant, and communication documented in the PACS or [employer] Electronically Signed   By: GZetta BillsM.D.   On: 09/23/2022 13:26   ECHOCARDIOGRAM COMPLETE  Result Date: 09/23/2022    ECHOCARDIOGRAM REPORT   Patient Name:   GNAIIM MACEDODate of Exam: 09/23/2022 Medical Rec #:  [id number]   Height:       63.0 in Accession #:    [id number]  Weight:       161.4 lb Date of Birth:  110/30/1957  BSA:          1.765  m Patient Age:    67 years     BP:           110/63 mmHg Patient Gender: M            HR:           100 bpm. Exam Location:  Inpatient Procedure: 2D Echo, Cardiac Doppler, Color Doppler and Intracardiac            Opacification Agent Indications:    Dyspnea R06.00  History:        Patient has prior history of Echocardiogram examinations, most                 recent 04/10/2020. CHF and Cardiomyopathy, PAD; Risk                 Factors:Hypertension and Diabetes. ESRD.  Sonographer:    Ronny Flurry Referring Phys: [place]   1. There is severe hypokinesis of the mid-to-apical septal, mid-to-apical anterior, and mid-to-apical inferior LV segments and apex. No LV thrombus visualized. Findings concerning for possible LAD disease vs takostubo cardiomyopathy. Left ventricular ejection fraction, by estimation, is 30 to 35%. The left ventricle has moderately decreased function. The left ventricle demonstrates regional wall motion abnormalities (see scoring diagram/findings for description). Left ventricular diastolic parameters  are consistent with Grade II diastolic dysfunction (pseudonormalization). The E/e' is 17.2.  2. Right ventricular systolic function is mildly reduced. The right ventricular size is normal.  3. Left atrial size was mildly dilated.  4. The mitral valve is grossly normal. Mild mitral valve regurgitation.  5. The aortic valve is tricuspid. Aortic valve regurgitation is not visualized. Aortic valve sclerosis is present, with no evidence of aortic valve stenosis.  6. The inferior vena cava is dilated in size with <50% respiratory variability, suggesting right atrial pressure of 15 mmHg. Comparison(s): Compared to prior TTE in 2021, the EF has dropped from 50-55% to 30-35% with WMA as detailed above. FINDINGS  Left Ventricle: There is severe hypokinesis of the mid-to-apical septal, mid-to-apical anterior, and mid-to-apical inferior LV segments and apex. No LV thrombus visualized. Findings concerning for possible LAD disease vs takostubo cardiomyopathy. Left ventricular ejection fraction, by estimation, is 30 to 35%. The left ventricle has moderately decreased function. The left ventricle demonstrates regional wall motion abnormalities. Definity contrast agent was given IV to delineate the left ventricular endocardial borders. The left ventricular internal cavity size was normal in size. There is no left ventricular hypertrophy. Left ventricular diastolic parameters are consistent with Grade II diastolic dysfunction  (pseudonormalization). The E/e' is 17.2. Right Ventricle: The right ventricular size is normal. No increase in right ventricular wall thickness. Right ventricular systolic function is mildly reduced. Left Atrium: Left atrial size was mildly dilated. Right Atrium: Right atrial size was normal in size. Pericardium: There is no evidence of pericardial effusion. Mitral Valve: The mitral valve is grossly normal. There is mild calcification of the mitral valve leaflet(s). Mild mitral valve regurgitation. Tricuspid Valve: The tricuspid valve is normal in structure. Tricuspid valve regurgitation is trivial. Aortic Valve: The aortic valve is tricuspid. Aortic valve regurgitation is not visualized. Aortic valve sclerosis is present, with no evidence of aortic valve stenosis. Aortic valve mean gradient measures 4.0 mmHg. Aortic valve peak gradient measures 7.7  mmHg. Aortic valve area, by VTI measures 2.00 cm. Pulmonic Valve: The pulmonic valve was normal in structure. Pulmonic valve regurgitation is trivial. Aorta: The aortic root and ascending aorta are structurally normal, with no  evidence of dilitation. Venous: The inferior vena cava is dilated in size with less than 50% respiratory variability, suggesting right atrial pressure of 15 mmHg. IAS/Shunts: The atrial septum is grossly normal. Additional Comments: There is a small pleural effusion in the left lateral region.  LEFT VENTRICLE PLAX 2D LVIDd:         4.40 cm   Diastology LVIDs:         2.90 cm   LV e' medial:    8.02 cm/s LV PW:         1.10 cm   LV E/e' medial:  17.0 LV IVS:        0.90 cm   LV e' lateral:   8.06 cm/s LVOT diam:     1.80 cm   LV E/e' lateral: 16.9 LV SV:         48 LV SV Index:   27 LVOT Area:     2.54 cm  RIGHT VENTRICLE             IVC RV S prime:     11.70 cm/s  IVC diam: 2.00 cm TAPSE (M-mode): 1.6 cm LEFT ATRIUM           Index        RIGHT ATRIUM           Index LA diam:      3.90 cm 2.21 cm/m   RA Area:     13.50 cm LA Vol (A2C): 52.4 ml  29.69 ml/m  RA Volume:   31.30 ml  17.73 ml/m LA Vol (A4C): 59.6 ml 33.77 ml/m  AORTIC VALVE AV Area (Vmax):    1.95 cm AV Area (Vmean):   1.88 cm AV Area (VTI):     2.00 cm AV Vmax:           139.00 cm/s AV Vmean:          98.200 cm/s AV VTI:            0.239 m AV Peak Grad:      7.7 mmHg AV Mean Grad:      4.0 mmHg LVOT Vmax:         106.25 cm/s LVOT Vmean:        72.425 cm/s LVOT VTI:          0.188 m LVOT/AV VTI ratio: 0.79  AORTA Ao Root diam: 2.90 cm Ao Asc diam:  3.20 cm MITRAL VALVE MV Area (PHT): 7.59 cm     SHUNTS MV Decel Time: 100 msec     Systemic VTI:  0.19 m MV E velocity: 136.00 cm/s  Systemic Diam: 1.80 cm MV A velocity: 67.90 cm/s MV E/A ratio:  2.00 Gwyndolyn Kaufman MD Electronically signed by Gwyndolyn Kaufman MD Signature Date/Time: 09/23/2022/1:03:36 PM    Final    DG Chest Port 1 View  Result Date: 09/22/2022 CLINICAL DATA:  Cough for several days EXAM: PORTABLE CHEST 1 VIEW COMPARISON:  12/11/2019 FINDINGS: Dialysis catheter has been removed in the interval. Cardiac shadow is stable. Right basilar airspace opacity is noted consistent with focal pneumonia. Vascular stent is noted in the region of the left axillary vein. No bony abnormality is noted. IMPRESSION: Right basilar pneumonia. Electronically Signed   By: Inez Catalina M.D.   On: 09/22/2022 17:02     Assessment and Plan:   67 year old with end-stage renal disease on hemodialysis with BKA, PAD, type 2 diabetes with hypertension admitted with right basilar pneumonia, sepsis.  We were asked  to see in consultation for new drop in ejection fraction to 30 to 35% with Takotsubo like appearance.  Prior EF reported 50 to 55% in 04/2020.  Ejection fraction on echocardiogram 10/2019 was 30 to 35%.  Acute systolic heart failure/decrease in ejection fraction - Volume management per hemodialysis - Ejection fraction previously in April 2021 was the same 30 to 35%.  It seems like there was  improvement in September 2021, but now he is  once again in the 30 to 35% range.  Prior cardiac catheterization reviewed from 2021 when ejection fraction was low.  There was diffuse moderate coronary artery disease present no PCI performed. -Troponin however was increased to 5500 compatible with non-ST elevation myocardial infarction.  Recommend IV heparin for 48 hours. - Continue with goal-directed medical therapy to the best of our abilities.  With hemodialysis, fluid balance will be established during sessions.  He is not a candidate for Entresto or ACE inhibitor.  Recommend Toprol XL 25 mg as tolerated. -Given given his current underlying medical condition including BKA, we will continue to pursue aggressive medical management.  I do not think he is a good candidate for cardiac catheterization.  Nonspecific ST-T wave changes, T wave inversion noted in the inferior leads.  Slight elevation in aVR.  Could be compatible with generalized ischemia.  Thankfully, he is not having any chest discomfort.  We will follow along.    For questions or updates, please contact [place] Please consult [email or web address] for contact info under    Signed, Candee Furbish, MD  09/23/2022 1:58 PM

## 2022-09-23 NOTE — Progress Notes (Signed)
FMTS Brief Progress Note  S: Patient denies chest pain, states he is doing well and is trying to get some sleep.  O: BP 130/71 (BP Location: Right Arm)   Pulse 98   Temp 99.3 F (37.4 C) (Oral)   Resp (!) 27   Ht '5\' 3"'$  (1.6 m)   Wt 73.2 kg   SpO2 98%   BMI 28.59 kg/m   General: Awake, alert, no acute distress, comfortable appearing with BiPAP in place CV: RRR, no murmurs auscultated Pulm: Diminished breath sounds in right lower lobe, BiPAP in place, no signs of respiratory distress, satting between 89 to 92%  A/P: Acute respiratory failure Stable currently with use of BiPAP.  Multifocal pneumonia on CTA PE in addition to NSTEMI.  Prior VBG did not show signs of respiratory decompensation requiring further escalation in care or respiratory/metabolic acidosis. -Continue BiPAP, if respiratory status worsening will likely require PCCM consult  NSTEMI Continue IV heparin per cardiology recommendations in addition to GDMT. -Continue plan per day team and cardiology. - Orders reviewed. Labs for AM ordered, which was adjusted as needed.   Wells Guiles, DO 09/23/2022, 9:13 PM PGY-2, [facility] Night Resident  Please page [phone] with questions.

## 2022-09-23 NOTE — Progress Notes (Signed)
BLE venous duplex has been completed.    Results can be found under chart review under CV PROC. 09/23/2022 6:54 PM Devantae Babe RVT, RDMS

## 2022-09-23 NOTE — Progress Notes (Signed)
ANTICOAGULATION CONSULT NOTE - Initial Consult  Pharmacy Consult for heparin  Indication: chest pain/ACS  Allergies  Allergen Reactions   Tramadol Nausea And Vomiting    Pt states he took this on an empty stomach one time and it came right back up. He's not had it since.     Patient Measurements: Height: '5\' 3"'$  (160 cm) Weight: 73.2 kg (161 lb 6 oz) IBW/kg (Calculated) : 56.9 Heparin Dosing Weight: 71.7kg   Vital Signs: Temp: 98.2 F (36.8 C) (02/23 1117) Temp Source: Oral (02/23 1117) BP: 120/70 (02/23 1117) Pulse Rate: 103 (02/23 1159)  Labs: Recent Labs    09/22/22 1633 09/23/22 0126 09/23/22 1238  HGB 9.9* 8.5*  --   HCT 30.9* 26.2*  --   PLT 223 180  --   LABPROT 13.5  --   --   INR 1.0  --   --   CREATININE 3.06* 4.51*  --   TROPONINIHS  --   --  5,494*    Estimated Creatinine Clearance: 14.8 mL/min (A) (by C-G formula based on SCr of 4.51 mg/dL (H)).   Medical History: Past Medical History:  Diagnosis Date   CHF (congestive heart failure) ([place])    Chronic kidney disease    Diabetes mellitus without complication ([place])    PATIENT JUST LEARNED HE WAS DIABETIC   Hepatitis    Hyperlipemia    Hypertension    Hypokalemia    Hyponatremia    Necrotizing fasciitis (HCC)    Pneumonia    HX OF PNA   Assessment: Patient admitted with CC of cough and SOB. Cardiology consulted for new drop in EF from 50-55% to 30-35%.  Trop elevated to 5500 suspected NSTEMI, cardiology recommending 48 hours of heparin.   Patient ESRD at baseline, no anticoagulation PTA. Did receive heparin subq this AM.   Goal of Therapy:  Heparin level 0.3-0.7 units/ml Monitor platelets by anticoagulation protocol: Yes   Plan:  No bolus due to recent heparin subq  Start heparin infusion at 850 units/hr Check anti-Xa level in 8 hours and daily while on heparin Continue to monitor H&H and platelets  Esmeralda Arthur, PharmD, BCCCP  09/23/2022,2:50 PM

## 2022-09-23 NOTE — Progress Notes (Signed)
Rounding Note    Patient Name: [name] Date of Encounter: 09/23/2022  [place] Cardiologist: Elouise Munroe, MD ***  Subjective   ***  Inpatient Medications    Scheduled Meds:  aspirin EC  81 mg Oral Daily   atorvastatin  20 mg Oral Daily   [START ON 09/24/2022] calcitRIOL  0.5 mcg Oral Q T,Th,Sa-HD   Chlorhexidine Gluconate Cloth  6 each Topical Daily   [START ON 09/24/2022] Chlorhexidine Gluconate Cloth  6 each Topical Q0600   metoprolol succinate  25 mg Oral Daily   sevelamer carbonate  800 mg Oral TID WC   Continuous Infusions:  ceFEPime (MAXIPIME) IV 1 g (09/23/22 2004)   heparin 850 Units/hr (09/23/22 1522)   PRN Meds:    Vital Signs    Vitals:   09/23/22 1553 09/23/22 1607 09/23/22 1800 09/23/22 1933  BP: 135/73   130/71  Pulse: (!) 109  (!) 101 98  Resp: 20  (!) 31 (!) 27  Temp: 98 F (36.7 C)   99.3 F (37.4 C)  TempSrc: Oral   Oral  SpO2: 93% 91% 97% 98%  Weight:      Height:        Intake/Output Summary (Last 24 hours) at 09/23/2022 2054 Last data filed at 09/23/2022 1700 Gross per 24 hour  Intake 395.18 ml  Output --  Net 395.18 ml      09/23/2022    1:00 AM 09/22/2022    4:20 PM 07/08/2022   10:11 AM  Last 3 Weights  Weight (lbs) 161 lb 6 oz 160 lb 15 oz 160 lb  Weight (kg) 73.2 kg 73 kg 72.576 kg      Telemetry    *** - Personally Reviewed  ECG    *** - Personally Reviewed  Physical Exam  *** GEN: No acute distress.   Neck: No JVD Cardiac: RRR, no murmurs, rubs, or gallops.  Respiratory: Clear to auscultation bilaterally. GI: Soft, nontender, non-distended  MS: No edema; No deformity. Neuro:  Nonfocal  Psych: Normal affect   Labs    High Sensitivity Troponin:   Recent Labs  Lab 09/23/22 1238 09/23/22 1424  TROPONINIHS 5,494* 6,320*     Chemistry Recent Labs  Lab 09/22/22 1633 09/23/22 0120 09/23/22 0126  NA 135  --  137  K 3.5  --  3.9  CL 90*  --  93*  CO2 25  --  20*  GLUCOSE 125*   --  173*  BUN 16  --  28*  CREATININE 3.06*  --  4.51*  CALCIUM 8.5*  --  7.9*  MG  --  2.1  --   PROT 7.4  --  6.2*  ALBUMIN 3.2*  --  2.5*  AST 35  --  26  ALT 19  --  16  ALKPHOS 62  --  51  BILITOT 1.4*  --  1.8*  GFRNONAA 22*  --  14*  ANIONGAP 20*  --  24*    Lipids No results for input(s): "CHOL", "TRIG", "HDL", "LABVLDL", "LDLCALC", "CHOLHDL" in the last 168 hours.  Hematology Recent Labs  Lab 09/22/22 1633 09/23/22 0126  WBC 17.2* 13.3*  RBC 3.08* 2.60*  HGB 9.9* 8.5*  HCT 30.9* 26.2*  MCV 100.3* 100.8*  MCH 32.1 32.7  MCHC 32.0 32.4  RDW 15.9* 15.9*  PLT 223 180   Thyroid No results for input(s): "TSH", "FREET4" in the last 168 hours.  BNP Recent Labs  Lab 09/22/22 1633  BNP 3,189.2*    DDimer No results for input(s): "DDIMER" in the last 168 hours.   Radiology    VAS [place] LOWER EXTREMITY VENOUS (DVT)  Result Date: 09/23/2022  Lower Venous DVT Study Patient Name:  [name]  Date of Exam:   09/23/2022 Medical Rec #: [id number]     Accession #:    [id number] Date of Birth: 1955/09/13    Patient Gender: M Patient Age:   67 years Exam Location:  [facility] Procedure:      VAS [place] LOWER EXTREMITY VENOUS (DVT) Referring Phys: Chrisandra Netters --------------------------------------------------------------------------------  Indications: Pulmonary embolism, however CT from today showed no evidence of PE.  Limitations: Body habitus, poor ultrasound/tissue interface and calcific shadowing, patient coughing throughout exam. Comparison Study: Previous RLEV was negative for DVT on 09/14/13 Performing Technologist: Rogelia Rohrer RVT, RDMS  Examination Guidelines: A complete evaluation includes B-mode imaging, spectral Doppler, color Doppler, and power Doppler as needed of all accessible portions of each vessel. Bilateral testing is considered an integral part of a complete examination. Limited examinations for reoccurring indications may be performed as noted. The reflux  portion of the exam is performed with the patient in reverse Trendelenburg.  +---------+---------------+---------+-----------+----------+-------------------+ RIGHT    CompressibilityPhasicitySpontaneityPropertiesThrombus Aging      +---------+---------------+---------+-----------+----------+-------------------+ CFV      Full           Yes      Yes                                      +---------+---------------+---------+-----------+----------+-------------------+ SFJ      Full                                                             +---------+---------------+---------+-----------+----------+-------------------+ FV Prox  Full           Yes      Yes                                      +---------+---------------+---------+-----------+----------+-------------------+ FV Mid   Full                                         Not well visualized +---------+---------------+---------+-----------+----------+-------------------+ FV DistalFull           Yes      Yes                                      +---------+---------------+---------+-----------+----------+-------------------+ PFV                     Yes      Yes                                      +---------+---------------+---------+-----------+----------+-------------------+ POP      Partial        Yes  Yes                  Age Indeterminate   +---------+---------------+---------+-----------+----------+-------------------+ PTV                                                   Not well visualized +---------+---------------+---------+-----------+----------+-------------------+ PERO                                                  Not well visualized +---------+---------------+---------+-----------+----------+-------------------+   +---------+---------------+---------+-----------+----------+-------------------+ LEFT     CompressibilityPhasicitySpontaneityPropertiesThrombus Aging       +---------+---------------+---------+-----------+----------+-------------------+ CFV      Full           Yes      Yes                                      +---------+---------------+---------+-----------+----------+-------------------+ SFJ      Full                                                             +---------+---------------+---------+-----------+----------+-------------------+ FV Prox  Full           Yes      Yes                                      +---------+---------------+---------+-----------+----------+-------------------+ FV Mid   Full                                         Not well visualized +---------+---------------+---------+-----------+----------+-------------------+ FV DistalFull           Yes      Yes                                      +---------+---------------+---------+-----------+----------+-------------------+ POP      Full           Yes      Yes                                      +---------+---------------+---------+-----------+----------+-------------------+ PTV                                                   BKA                 +---------+---------------+---------+-----------+----------+-------------------+ PERO  BKA                 +---------+---------------+---------+-----------+----------+-------------------+     Summary: BILATERAL: -No evidence of popliteal cyst, bilaterally. RIGHT: - Findings consistent with age indeterminate deep vein thrombosis involving the right popliteal vein.  LEFT: - There is no evidence of deep vein thrombosis in the lower extremity.  *See table(s) above for measurements and observations. Electronically signed by Orlie Pollen on 09/23/2022 at 6:56:58 PM.    Final    CT Angio Chest Pulmonary Embolism (PE) W or WO Contrast  Result Date: 09/23/2022 CLINICAL DATA:  67 year old male presents for evaluation of the chest, suspicion for pulmonary  embolism. EXAM: CT ANGIOGRAPHY CHEST WITH CONTRAST TECHNIQUE: Multidetector CT imaging of the chest was performed using the standard protocol during bolus administration of intravenous contrast. Multiplanar CT image reconstructions and MIPs were obtained to evaluate the vascular anatomy. RADIATION DOSE REDUCTION: This exam was performed according to the departmental dose-optimization program which includes automated exposure control, adjustment of the mA and/or kV according to patient size and/or use of iterative reconstruction technique. CONTRAST:  28m OMNIPAQUE IOHEXOL 350 MG/ML SOLN COMPARISON:  Chest radiograph September 22, 2022 FINDINGS: Cardiovascular: Aortic atherosclerosis both calcified and noncalcified is mild. Aorta is not aneurysmal. Bovine arch branching pattern. The aorta is otherwise not well assessed due to phase of bolus timing. Heart size moderately enlarged. No pericardial effusion or nodularity. Signs of coronary artery disease. Central pulmonary arteries are opacified to 297 Hounsfield units. Study is negative for acute pulmonary embolism to the central segmental level. Subsegmental branches with limited assessment particular in the mid chest due to motion related artifact. Web-like area in LEFT lower lobe pulmonary artery seen on image 144/6 passing through the pulmonary artery is favored to represent sequela of prior embolism. Mediastinum/Nodes: No signs of adenopathy in the chest. No acute process in the mediastinum. Esophagus grossly normal. Lungs/Pleura: Patchy bilateral airspace disease and basilar consolidation associated with large RIGHT and moderately large LEFT sided pleural effusions. Septal thickening. Airways are patent at the larger airway level, not well evaluated beyond the segmental level. Upper Abdomen: Incidental imaging of upper abdominal contents without acute process. Musculoskeletal: No acute musculoskeletal process or destructive bone finding. Review of the MIP images  confirms the above findings. IMPRESSION: 1. Study is negative for acute pulmonary embolism to the central segmental level. Beyond this level the study is limited by motion artifact particularly in the mid chest. 2. Web-like area in LEFT lower lobe pulmonary artery seen passing through the pulmonary artery is favored to represent sequela of prior embolism. Correlation with Doppler assessment may be helpful to determine whether there may be DVT it might change management. 3. Findings suspicious for multifocal pneumonia associated with bilateral pleural effusions. Concomitant heart failure is also considered. 4. Signs of coronary artery disease. 5. Aortic atherosclerosis. Aortic Atherosclerosis (ICD10-I70.0). These results will be called to the ordering clinician or representative by the Radiologist Assistant, and communication documented in the PACS or [employer] Electronically Signed   By: GZetta BillsM.D.   On: 09/23/2022 13:26   ECHOCARDIOGRAM COMPLETE  Result Date: 09/23/2022    ECHOCARDIOGRAM REPORT   Patient Name:   Ian ZWARTDate of Exam: 09/23/2022 Medical Rec #:  [id number]   Height:       63.0 in Accession #:    [id number]  Weight:       161.4 lb Date of Birth:  105-25-1957  BSA:  1.765 m Patient Age:    75 years     BP:           110/63 mmHg Patient Gender: M            HR:           100 bpm. Exam Location:  Inpatient Procedure: 2D Echo, Cardiac Doppler, Color Doppler and Intracardiac            Opacification Agent Indications:    Dyspnea R06.00  History:        Patient has prior history of Echocardiogram examinations, most                 recent 04/10/2020. CHF and Cardiomyopathy, PAD; Risk                 Factors:Hypertension and Diabetes. ESRD.  Sonographer:    Ronny Flurry Referring Phys: [place]  1. There is severe hypokinesis of the mid-to-apical septal, mid-to-apical anterior, and mid-to-apical inferior LV segments and apex. No LV thrombus  visualized. Findings concerning for possible LAD disease vs takostubo cardiomyopathy. Left ventricular ejection fraction, by estimation, is 30 to 35%. The left ventricle has moderately decreased function. The left ventricle demonstrates regional wall motion abnormalities (see scoring diagram/findings for description). Left ventricular diastolic parameters  are consistent with Grade II diastolic dysfunction (pseudonormalization). The E/e' is 17.2.  2. Right ventricular systolic function is mildly reduced. The right ventricular size is normal.  3. Left atrial size was mildly dilated.  4. The mitral valve is grossly normal. Mild mitral valve regurgitation.  5. The aortic valve is tricuspid. Aortic valve regurgitation is not visualized. Aortic valve sclerosis is present, with no evidence of aortic valve stenosis.  6. The inferior vena cava is dilated in size with <50% respiratory variability, suggesting right atrial pressure of 15 mmHg. Comparison(s): Compared to prior TTE in 2021, the EF has dropped from 50-55% to 30-35% with WMA as detailed above. FINDINGS  Left Ventricle: There is severe hypokinesis of the mid-to-apical septal, mid-to-apical anterior, and mid-to-apical inferior LV segments and apex. No LV thrombus visualized. Findings concerning for possible LAD disease vs takostubo cardiomyopathy. Left ventricular ejection fraction, by estimation, is 30 to 35%. The left ventricle has moderately decreased function. The left ventricle demonstrates regional wall motion abnormalities. Definity contrast agent was given IV to delineate the left ventricular endocardial borders. The left ventricular internal cavity size was normal in size. There is no left ventricular hypertrophy. Left ventricular diastolic parameters are consistent with Grade II diastolic dysfunction (pseudonormalization). The E/e' is 17.2. Right Ventricle: The right ventricular size is normal. No increase in right ventricular wall thickness. Right  ventricular systolic function is mildly reduced. Left Atrium: Left atrial size was mildly dilated. Right Atrium: Right atrial size was normal in size. Pericardium: There is no evidence of pericardial effusion. Mitral Valve: The mitral valve is grossly normal. There is mild calcification of the mitral valve leaflet(s). Mild mitral valve regurgitation. Tricuspid Valve: The tricuspid valve is normal in structure. Tricuspid valve regurgitation is trivial. Aortic Valve: The aortic valve is tricuspid. Aortic valve regurgitation is not visualized. Aortic valve sclerosis is present, with no evidence of aortic valve stenosis. Aortic valve mean gradient measures 4.0 mmHg. Aortic valve peak gradient measures 7.7  mmHg. Aortic valve area, by VTI measures 2.00 cm. Pulmonic Valve: The pulmonic valve was normal in structure. Pulmonic valve regurgitation is trivial. Aorta: The aortic root and ascending aorta are structurally normal, with  no evidence of dilitation. Venous: The inferior vena cava is dilated in size with less than 50% respiratory variability, suggesting right atrial pressure of 15 mmHg. IAS/Shunts: The atrial septum is grossly normal. Additional Comments: There is a small pleural effusion in the left lateral region.  LEFT VENTRICLE PLAX 2D LVIDd:         4.40 cm   Diastology LVIDs:         2.90 cm   LV e' medial:    8.02 cm/s LV PW:         1.10 cm   LV E/e' medial:  17.0 LV IVS:        0.90 cm   LV e' lateral:   8.06 cm/s LVOT diam:     1.80 cm   LV E/e' lateral: 16.9 LV SV:         48 LV SV Index:   27 LVOT Area:     2.54 cm  RIGHT VENTRICLE             IVC RV S prime:     11.70 cm/s  IVC diam: 2.00 cm TAPSE (M-mode): 1.6 cm LEFT ATRIUM           Index        RIGHT ATRIUM           Index LA diam:      3.90 cm 2.21 cm/m   RA Area:     13.50 cm LA Vol (A2C): 52.4 ml 29.69 ml/m  RA Volume:   31.30 ml  17.73 ml/m LA Vol (A4C): 59.6 ml 33.77 ml/m  AORTIC VALVE AV Area (Vmax):    1.95 cm AV Area (Vmean):   1.88  cm AV Area (VTI):     2.00 cm AV Vmax:           139.00 cm/s AV Vmean:          98.200 cm/s AV VTI:            0.239 m AV Peak Grad:      7.7 mmHg AV Mean Grad:      4.0 mmHg LVOT Vmax:         106.25 cm/s LVOT Vmean:        72.425 cm/s LVOT VTI:          0.188 m LVOT/AV VTI ratio: 0.79  AORTA Ao Root diam: 2.90 cm Ao Asc diam:  3.20 cm MITRAL VALVE MV Area (PHT): 7.59 cm     SHUNTS MV Decel Time: 100 msec     Systemic VTI:  0.19 m MV E velocity: 136.00 cm/s  Systemic Diam: 1.80 cm MV A velocity: 67.90 cm/s MV E/A ratio:  2.00 Gwyndolyn Kaufman MD Electronically signed by Gwyndolyn Kaufman MD Signature Date/Time: 09/23/2022/1:03:36 PM    Final    DG Chest Port 1 View  Result Date: 09/22/2022 CLINICAL DATA:  Cough for several days EXAM: PORTABLE CHEST 1 VIEW COMPARISON:  12/11/2019 FINDINGS: Dialysis catheter has been removed in the interval. Cardiac shadow is stable. Right basilar airspace opacity is noted consistent with focal pneumonia. Vascular stent is noted in the region of the left axillary vein. No bony abnormality is noted. IMPRESSION: Right basilar pneumonia. Electronically Signed   By: Inez Catalina M.D.   On: 09/22/2022 17:02    Cardiac Studies   TTE 09/23/22: IMPRESSIONS     1. There is severe hypokinesis of the mid-to-apical septal, mid-to-apical  anterior, and mid-to-apical inferior LV segments and apex.  No LV thrombus  visualized. Findings concerning for possible LAD disease vs takostubo  cardiomyopathy. Left ventricular  ejection fraction, by estimation, is 30 to 35%. The left ventricle has  moderately decreased function. The left ventricle demonstrates regional  wall motion abnormalities (see scoring diagram/findings for description).  Left ventricular diastolic parameters   are consistent with Grade II diastolic dysfunction (pseudonormalization).  The E/e' is 17.2.   2. Right ventricular systolic function is mildly reduced. The right  ventricular size is normal.   3. Left  atrial size was mildly dilated.   4. The mitral valve is grossly normal. Mild mitral valve regurgitation.   5. The aortic valve is tricuspid. Aortic valve regurgitation is not  visualized. Aortic valve sclerosis is present, with no evidence of aortic  valve stenosis.   6. The inferior vena cava is dilated in size with <50% respiratory  variability, suggesting right atrial pressure of 15 mmHg.   Comparison(s): Compared to prior TTE in 2021, the EF has dropped from  50-55% to 30-35% with WMA as detailed above.   Patient Profile     67 y.o. 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   Assessment & Plan    #Acute Systolic HF:  #Sepsis Secondary to Pneumonia:  #HTN:  #ESRD on HD: {Are we signing off today?:[id number]}  For questions or updates, please contact [place] Please consult [email or web address] for contact info under        Signed, Freada Bergeron, MD  09/23/2022, 8:54 PM

## 2022-09-23 NOTE — Consult Note (Signed)
   [facility] CM Inpatient Consult   09/23/2022  [name] 1956/06/02 [id number]  [place] [facility] [ACO] Patient:  Medicare ACO  Primary Care Provider: Patient, No Pcp Per  *This patient is showing as Long term care in a facility   Reason:   Membership roster was used to verify patient status. Patient not attributed on the latest roster.  Will sign off  For additional questions or referrals please contact:    For questions, please call:  Natividad Brood, RN BSN [facility]  [phone] business mobile phone Toll free office [phone]  *[place]  [phone] Fax number: [phone] [email or web address]

## 2022-09-23 NOTE — Progress Notes (Signed)
Called and spoke with Trish with [facility] Cardiology-patient has echo with newly reduced EF and findings concerning for LAD vs. Takotsubo's cardiomyopathy. Cardiology will see patient.

## 2022-09-23 NOTE — TOC Progression Note (Signed)
Transition of Care [facility]) - Progression Note    Patient Details  Name: Kasheem Mazzarella MRN: [id number] Date of Birth: 1956/06/19  [facility]) CM/SW Contact  Zenon Mayo, RN Phone Number: 09/23/2022, 4:34 PM  Clinical Narrative:    from [place] SN[facility], ESRD w/chair bound, L BKA. Need SNF w/up.        Expected Discharge Plan and Services                                               Social Determinants of Health (SDOH) Interventions SDOH Screenings   Food Insecurity: No Food Insecurity (09/23/2022)  Housing: Low Risk  (09/23/2022)  Transportation Needs: No Transportation Needs (09/23/2022)  Utilities: Not At Risk (09/23/2022)  Tobacco Use: Low Risk  (09/22/2022)    Readmission Risk Interventions     No data to display

## 2022-09-23 NOTE — Progress Notes (Addendum)
FMTS Interim Progress Note  S:Went and assessed patient at bedside. Updated patient about CT Lung findings (no acute PE but possible prior PE and we will check a DVT u/s) and echocardiogram with worsened EF plus regional wall motion abnormality and possible takotsubo vs. LAD disease. He says he feels like his breathing is better although he is on a significant amount of oxygen  O: BP 120/70 (BP Location: Right Arm)   Pulse (!) 103   Temp 98.2 F (36.8 C) (Oral)   Resp 18   Ht '5\' 3"'$  (1.6 m)   Wt 73.2 kg   SpO2 96%   BMI 28.59 kg/m    General: Ill appearing but NAD awake, alert, responsive to questions Head: Normocephalic atraumatic CV: Tahcycardic in low 100s, sinus Respiratory: chest rises symmetrically,  mod increased work of breathing on 9 L saturating at 86% > increased him to 11 L and saturating at 91%  A/P: Acute Respiratory Failure  Newly Reduced HF  Elevated Troponin Trops 5000s. -HFNC 11 L -RT following -Low threshold to involve PCCM -continue Abx -Cardiology consulted-spoke with Dr. Marlou Porch will start heparin gtt -EKG STAT  -ABX for PNA -DVT u/s STAT -EKG STAT -Trend troponins  Gerrit Heck, MD 09/23/2022, 2:14 PM PGY-2, [place] Medicine Service pager [phone]

## 2022-09-23 NOTE — Progress Notes (Signed)
RN called RT to bedside to place Pt on bipap per MD order due to Pt increased WOB and low sats despite being on 12L HFNC (salter). RT assessed Pt and noticed low O2, increased WOB, with tachypnea. Pt placed on bipap per MD order, Pt seems to be tolerating it well at this time.

## 2022-09-23 NOTE — Consult Note (Signed)
Renal Service Consult Note [facility]  [name] 09/23/2022 Ian Blazing, MD Requesting Physician: Dr. Ardelia Mems  Reason for Consult: ESRD pt admitted w/ PNA/ sepsis HPI: The patient is a 67 y.o. year-old w/ PMH as below who presented to ED w/ 3 day hx of cough and CXR done at facility w/ possible PNA. EMS got 86% on RA and 93% on 4 L. HR and RR were high, WBC 17k. Admitted for RLL PNA/ HAP and started on IV abx. We are asked to see for ESRD.   Pt seen in room. No c/o's today. Has been in SNF's > 7 yrs and on dialysis for just as long. Vague historian. No abd pain or CP. Goes to HD [address] on TTS schedule. Has not missed. [name] drives him back and forth to dialysis.   ROS - denies CP, no joint pain, no HA, no blurry vision, no rash, no diarrhea, no nausea/ vomiting, no dysuria, no difficulty voiding   Past Medical History  Past Medical History:  Diagnosis Date   CHF (congestive heart failure) (HCC)    Chronic kidney disease    Diabetes mellitus without complication (HCC)    PATIENT JUST LEARNED HE WAS DIABETIC   Hepatitis    Hyperlipemia    Hypertension    Hypokalemia    Hyponatremia    Necrotizing fasciitis ([place])    Pneumonia    HX OF PNA   Past Surgical History  Past Surgical History:  Procedure Laterality Date   AMPUTATION Left 09/11/2013   Procedure: AMPUTATION BELOW KNEE;  Surgeon: Marianna Payment, MD;  Location: [place];  Service: Orthopedics;  Laterality: Left;   AMPUTATION Right 12/18/2013   Procedure: RIGHT FOOT 1,2, TOE AMPUTATION  5th toe RAY AMPUTATION;  Surgeon: Marianna Payment, MD;  Location: [place];  Service: Orthopedics;  Laterality: Right;   AV FISTULA PLACEMENT Left 12/11/2019   Procedure: LEFT BRACHIOCEPHALIC FISTULA CREATION;  Surgeon: Rosetta Posner, MD;  Location: [place];  Service: Vascular;  Laterality: Left;   CHOLECYSTECTOMY     I & D EXTREMITY Left 09/03/2013   Procedure: IRRIGATION AND DEBRIDEMENT EXTREMITY;  Surgeon:  Marianna Payment, MD;  Location: [place];  Service: Orthopedics;  Laterality: Left;   I & D EXTREMITY Left 09/11/2013   Procedure: LEFT FOOT IRRIGATION AND DEBRIDEMENT;  Surgeon: Marianna Payment, MD;  Location: [place];  Service: Orthopedics;  Laterality: Left;   INSERTION OF DIALYSIS CATHETER  12/11/2019   Procedure: Insertion Of Dialysis Catheter;  Surgeon: Rosetta Posner, MD;  Location: [place];  Service: Vascular;;   INSERTION OF DIALYSIS CATHETER Right 07/11/2022   Procedure: INSERTION OF TUNNELED DIALYSIS CATHETER;  Surgeon: Angelia Mould, MD;  Location: [place];  Service: Vascular;  Laterality: Right;   LEFT HEART CATH AND CORONARY ANGIOGRAPHY N/A 12/13/2019   Procedure: LEFT HEART CATH AND CORONARY ANGIOGRAPHY;  Surgeon: Jettie Booze, MD;  Location: [place] [facility];  Service: Cardiovascular;  Laterality: N/A;   REVISON OF ARTERIOVENOUS FISTULA Left 07/11/2022   Procedure: REVISON OF LEFT ARM ARTERIOVENOUS FISTULA;  Surgeon: Angelia Mould, MD;  Location: [place];  Service: Vascular;  Laterality: Left;   RIGHT HEART CATH N/A 12/13/2019   Procedure: RIGHT HEART CATH;  Surgeon: Jettie Booze, MD;  Location: [place];  Service: Cardiovascular;  Laterality: N/A;   Family History  Family History  Problem Relation Age of Onset   Diabetes type II Mother  Dementia Mother    Heart disease Father    Social History  reports that he has never smoked. He has never used smokeless tobacco. He reports that he does not drink alcohol and does not use drugs. Allergies  Allergies  Allergen Reactions   Tramadol Nausea And Vomiting    Pt states he took this on an empty stomach one time and it came right back up. He's not had it since.    Home medications Prior to Admission medications   Medication Sig Start Date End Date Taking? Authorizing Provider  amLODipine (NORVASC) 5 MG tablet Take 5 mg by mouth daily. 07/22/21   [provider]  aspirin EC 81 MG  tablet Take 81 mg by mouth daily. Swallow whole.    [provider]  atorvastatin (LIPITOR) 20 MG tablet Take 1 tablet (20 mg total) by mouth daily. 12/17/19   [name], MD  carvedilol (COREG) 25 MG tablet Take 1 tablet (25 mg total) by mouth 2 (two) times daily with a meal. Patient taking differently: Take 12.5 mg by mouth 2 (two) times daily with a meal. 12/16/19   Christian, Rylee, MD  hydrocerin (EUCERIN) CREA Apply 1 application topically 2 (two) times daily. 12/16/19   [name], MD  isosorbide mononitrate (IMDUR) 30 MG 24 hr tablet Take 0.5 tablets (15 mg total) by mouth daily. Patient taking differently: Take 30 mg by mouth daily. 12/17/19   [name], MD  Multiple Vitamins-Minerals (MULTIVITAMIN WITH MINERALS) tablet Take 1 tablet by mouth daily with breakfast.    [provider]  oxyCODONE-acetaminophen (PERCOCET) 5-325 MG tablet Take 1 tablet by mouth every 4 (four) hours as needed for severe pain. 07/11/22 07/11/23  Angelia Mould, MD  sevelamer carbonate (RENVELA) 800 MG tablet Take 2 tablets (1,600 mg total) by mouth 3 (three) times daily with meals. 12/16/19   [name], MD     Vitals:   09/23/22 [id number] 09/23/22 0937 09/23/22 1117 09/23/22 1159  BP:  115/63 120/70   Pulse:  (!) 108 (!) 102 (!) 103  Resp:  (!) '22 17 18  '$ Temp:  98.2 F (36.8 C) 98.2 F (36.8 C)   TempSrc:  Oral Oral   SpO2: 91%  90% 96%  Weight:      Height:       Exam Gen alert, no distress, disheveled pleasant WM No rash, cyanosis or gangrene Sclera anicteric, throat clear  No jvd or bruits Chest scattered crackles bilat bases, no wheezing or E -> A changes RRR no RG Abd soft ntnd no mass or ascites +bs GU normal male MS L BKA, R foot w/ toe amps Ext no LE or UE edema, no wounds or ulcers Neuro is alert, Ox 3 , nf    LUA AVF+bruit    Home meds include - coreg 12.5 bid, percocet, MVI, norvasc 5, aspirin, lipitor, imdur 30 qd, renvela 1-2 ac  tid     OP HD: TTS G-O 3h 73mn  400/700   72.5kg  2/2.5 bath  LUA AVF  Hep 2000 - last HD 2/22, post wt 72.6kg   - rocaltrol 0.50 mcg po tiw - venofer 50 mg weekly IV - mircera 120 mcg IV q 4 wks, last 2/15, due 2/29     CXR RLL opacities    CT chest - no PE, diffuse bilat LL opacities w/ some pleural effusions     BP's 110- 130 range/ 60-80   HR 100-110, RR 18-22  Na 137  K 3.9  CO2 20  BUN 28  Creat 4.5  Ca 7.9  alb 2.5  LFT's ok      Tbili 1.8  WBC 13k  Hb 8.5     Assessment/ Plan: Sepsis / bilat PNA - on IV abx, w/ ^HR/ RR, per pmd Acute systolic HF / NSTEMI - +trop 5500, get vol down w/ dialysis as tolerated. On IV heparin per cardiology. ESRD - on HD TTS. Has not missed OP HD. UP 1 kg. Try UF 2.-3  L UF w/ HD tomorrow.  HTN/ volume - BP's low-normal, not grossly overloaded on exam. Imaging no pulm, +pleural effusions.  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 Cont po vdra.    Kelly Splinter  MD [facility] 09/23/2022, 3:22 PM  Recent Labs  Lab 09/22/22 1633 09/23/22 0126  HGB 9.9* 8.5*  ALBUMIN 3.2* 2.5*  CALCIUM 8.5* 7.9*  CREATININE 3.06* 4.51*  K 3.5 3.9   Inpatient medications:  aspirin EC  81 mg Oral Daily   atorvastatin  20 mg Oral Daily   Chlorhexidine Gluconate Cloth  6 each Topical Daily   metoprolol succinate  25 mg Oral Daily   sevelamer carbonate  800 mg Oral TID WC    ceFEPime (MAXIPIME) IV     heparin 850 Units/hr (09/23/22 1522)

## 2022-09-24 ENCOUNTER — Encounter (HOSPITAL_COMMUNITY): Payer: Self-pay | Admitting: Student

## 2022-09-24 ENCOUNTER — Inpatient Hospital Stay (HOSPITAL_COMMUNITY): Payer: Medicare Other

## 2022-09-24 DIAGNOSIS — I824Z9 Acute embolism and thrombosis of unspecified deep veins of unspecified distal lower extremity: Secondary | ICD-10-CM

## 2022-09-24 DIAGNOSIS — I25119 Atherosclerotic heart disease of native coronary artery with unspecified angina pectoris: Secondary | ICD-10-CM | POA: Diagnosis not present

## 2022-09-24 DIAGNOSIS — I214 Non-ST elevation (NSTEMI) myocardial infarction: Secondary | ICD-10-CM

## 2022-09-24 DIAGNOSIS — J159 Unspecified bacterial pneumonia: Principal | ICD-10-CM

## 2022-09-24 DIAGNOSIS — J9601 Acute respiratory failure with hypoxia: Secondary | ICD-10-CM | POA: Diagnosis not present

## 2022-09-24 DIAGNOSIS — I5021 Acute systolic (congestive) heart failure: Secondary | ICD-10-CM | POA: Diagnosis not present

## 2022-09-24 DIAGNOSIS — R7989 Other specified abnormal findings of blood chemistry: Secondary | ICD-10-CM | POA: Insufficient documentation

## 2022-09-24 DIAGNOSIS — I82409 Acute embolism and thrombosis of unspecified deep veins of unspecified lower extremity: Secondary | ICD-10-CM

## 2022-09-24 DIAGNOSIS — N186 End stage renal disease: Secondary | ICD-10-CM | POA: Diagnosis not present

## 2022-09-24 LAB — CBC
HCT: 26.5 % — ABNORMAL LOW (ref 39.0–52.0)
Hemoglobin: 8.7 g/dL — ABNORMAL LOW (ref 13.0–17.0)
MCH: 32.1 pg (ref 26.0–34.0)
MCHC: 32.8 g/dL (ref 30.0–36.0)
MCV: 97.8 fL (ref 80.0–100.0)
Platelets: 181 10*3/uL (ref 150–400)
RBC: 2.71 MIL/uL — ABNORMAL LOW (ref 4.22–5.81)
RDW: 16 % — ABNORMAL HIGH (ref 11.5–15.5)
WBC: 10.2 10*3/uL (ref 4.0–10.5)
nRBC: 0.7 % — ABNORMAL HIGH (ref 0.0–0.2)

## 2022-09-24 LAB — GLUCOSE, CAPILLARY
Glucose-Capillary: 137 mg/dL — ABNORMAL HIGH (ref 70–99)
Glucose-Capillary: 162 mg/dL — ABNORMAL HIGH (ref 70–99)
Glucose-Capillary: 110 mg/dL — ABNORMAL HIGH (ref 70–99)
Glucose-Capillary: 122 mg/dL — ABNORMAL HIGH (ref 70–99)

## 2022-09-24 LAB — RENAL FUNCTION PANEL
Albumin: 2.3 g/dL — ABNORMAL LOW (ref 3.5–5.0)
Anion gap: 21 — ABNORMAL HIGH (ref 5–15)
BUN: 53 mg/dL — ABNORMAL HIGH (ref 8–23)
CO2: 20 mmol/L — ABNORMAL LOW (ref 22–32)
Calcium: 7.5 mg/dL — ABNORMAL LOW (ref 8.9–10.3)
Chloride: 88 mmol/L — ABNORMAL LOW (ref 98–111)
Creatinine, Ser: 6.12 mg/dL — ABNORMAL HIGH (ref 0.61–1.24)
GFR, Estimated: 10 mL/min — ABNORMAL LOW (ref >60)
Glucose, Bld: 135 mg/dL — ABNORMAL HIGH (ref 70–99)
Phosphorus: 5.6 mg/dL — ABNORMAL HIGH (ref 2.5–4.6)
Potassium: 3.8 mmol/L (ref 3.5–5.1)
Sodium: 129 mmol/L — ABNORMAL LOW (ref 135–145)

## 2022-09-24 LAB — HEPARIN LEVEL (UNFRACTIONATED): Heparin Unfractionated: 0.3 IU/mL (ref 0.30–0.70)

## 2022-09-24 MED ORDER — HEPARIN SODIUM (PORCINE) 1000 UNIT/ML DIALYSIS: 2000.0000 [IU] | Freq: Once | INTRAMUSCULAR | Status: DC

## 2022-09-24 MED ORDER — POTASSIUM CHLORIDE 10 MEQ/100ML IV SOLN: 10.0000 meq | INTRAVENOUS | Status: DC

## 2022-09-24 MED ORDER — POTASSIUM CHLORIDE 10 MEQ/100ML IV SOLN
10.0000 meq | INTRAVENOUS | Status: AC
  Administered 2022-09-24 (×2): 10 meq via INTRAVENOUS
  Filled 2022-09-24 (×2): qty 100

## 2022-09-24 NOTE — TOC Progression Note (Signed)
Transition of Care [facility]) - Initial/Assessment Note    Patient Details  Name: [name] MRN: [id number] Date of Birth: 1955-11-27  Transition of [facility]) CM/SW Contact:    Milinda Antis, [place] Phone Number: 09/24/2022, 3:28 PM  Clinical Narrative:                 LCSW contacted [name] and left a secure voicemail for [employer] in admissions to inquire about what would be needed in order for the patient to return when medically ready.   LCSW attempted to meet with patient but patient was in a procedure.  TOC following.         Patient Goals and CMS Choice            Expected Discharge Plan and Services                                              Prior Living Arrangements/Services                       Activities of Daily Living Home Assistive Devices/Equipment: Wheelchair ADL Screening (condition at time of admission) Patient's cognitive ability adequate to safely complete daily activities?: Yes Is the patient deaf or have difficulty hearing?: No Does the patient have difficulty seeing, even when wearing glasses/contacts?: No Does the patient have difficulty concentrating, remembering, or making decisions?: No Patient able to express need for assistance with ADLs?: Yes Does the patient have difficulty dressing or bathing?: Yes Independently performs ADLs?: No Communication: Independent Dressing (OT): Needs assistance Is this a change from baseline?: Change from baseline, expected to last >3 days Grooming: Needs assistance Is this a change from baseline?: Change from baseline, expected to last <3 days Feeding: Independent Bathing: Needs assistance Is this a change from baseline?: Change from baseline, expected to last <3 days Toileting: Needs assistance Is this a change from baseline?: Change from baseline, expected to last <3 days In/Out Bed: Needs assistance Is this a change from baseline?: Change from baseline, expected to  last <3 days Walks in Home: Dependent Is this a change from baseline?: Change from baseline, expected to last <3 days Does the patient have difficulty walking or climbing stairs?: Yes Weakness of Legs: Both Weakness of Arms/Hands: None  Permission Sought/Granted                  Emotional Assessment              Admission diagnosis:  Sepsis ([place]) [A41.9] Hospital-acquired bacterial pneumonia [J15.9] Multifocal pneumonia [J18.9] Patient Active Problem List   Diagnosis Date Noted   NSTEMI (non-ST elevated myocardial infarction) ([place]) 09/24/2022   DVT (deep venous thrombosis) ([place]) 09/24/2022   Acute hypoxic respiratory failure ([place]) 09/23/2022   Multifocal pneumonia 09/23/2022   Sepsis ([place]) 09/22/2022   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/09/2021   Fluid overload, unspecified 11/06/2020   COVID-19 08/10/2020   Iron deficiency anemia, unspecified 01/10/2020   Abnormality of albumin 12/20/2019   Encounter for immunization 12/17/2019   Anaphylactic shock, unspecified, initial encounter 12/16/2019   Cardiomyopathy, unspecified ([place]) 12/16/2019   Heart failure, unspecified ([place]) 12/16/2019   Hyperparathyroidism, unspecified ([place]) [id number]   Metabolic disorder, unspecified 12/16/2019   Allergy, unspecified, initial encounter 12/16/2019   Other specified coagulation defects ([place]) 12/16/2019   Pain, unspecified [id number]   Acute systolic  heart failure (HCC)    S/P dialysis catheter insertion ([place])    ESRD on dialysis ([place]) 12/12/2019   PAD (peripheral artery disease) ([place]) 12/12/2019   Pressure injury of skin 12/01/2019   Anemia 11/29/2019   Uncontrolled hypertension 11/29/2019   CKD (chronic kidney disease), stage V ([place]) 11/29/2019   Severe protein-calorie malnutrition (HCC)    Diabetic polyneuropathy associated with type 2 diabetes mellitus ([place])    Venous insufficiency of right lower extremity    Pneumonia    Renal failure 11/28/2019   Toe  gangrene ([place]) 12/18/2013   Gangrene of toe ([place]) 09/16/2013   Fever with chills 09/16/2013   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   DKA (diabetic ketoacidoses) 09/04/2013   Type 2 diabetes mellitus, controlled ([place]) 09/02/2013   DM (diabetes mellitus) type II controlled, neurological manifestation ([place]) 09/01/2013   PCP:  Patient, No Pcp Per Pharmacy:   [facility] ([place]), [place] - [address] [place]  [address] Phone: [phone] Fax: [phone]     Social Determinants of Health (SDOH) Social History: SDOH Screenings   Food Insecurity: No Food Insecurity (09/23/2022)  Housing: Low Risk  (09/23/2022)  Transportation Needs: No Transportation Needs (09/23/2022)  Utilities: Not At Risk (09/23/2022)  Tobacco Use: Low Risk  (09/22/2022)   SDOH Interventions:     Readmission Risk Interventions     No data to display

## 2022-09-24 NOTE — Progress Notes (Signed)
Transported from hemodialysis to [id number] without complications

## 2022-09-24 NOTE — Progress Notes (Signed)
Pt placed back on bipap per MD,tolerating well at this time.

## 2022-09-24 NOTE — Progress Notes (Signed)
FMTS Interim Progress Note  S:Went to bedside to check on patient, he was resting comfortably so I did not wake him.   O: BP 132/80 (BP Location: Right Arm)   Pulse 97   Temp 99.8 F (37.7 C) (Oral)   Resp 20   Ht '5\' 3"'$  (1.6 m)   Wt 75.8 kg   SpO2 98%   BMI 29.60 kg/m   General: Patient sleeping comfortably, BiPAP in place and in no acute distress. Resp: normal work of breathing noted without signs of respiratory distress  A/P: Patient admitted with pneumonia, initially met sepsis criteria. Also noted to have NSTEMI and age indeterminate DVT, on heparin drip with plan to switch to DOAC in the morning. Continue cefepime. Noted to be tachypneic earlier in the day, CCM consulted and recommended repeat CXR after HD which demonstrated slight improvement from prior imaging. Pending ABG. Vitals and telemetry reviewed and appropriate. Orders reviewed. Continue BiPAP and continue to monitor respiratory status. Remainder of plan per day team.  Donney Dice, DO 09/24/2022, 10:19 PM PGY-3, [facility] Service pager [phone]

## 2022-09-24 NOTE — Progress Notes (Signed)
RT arrived to pts room pt is refusing to wear BiPAP Spo2 of 81%. Pt placed back on HFNC(salter) that he tolerated during the day of 12L. SpO2 is not currenly at 86%. Pt stated he does not want to wear BiPAP and that it is too bad, I tried it and don't like it. RN and CN made aware of situation.

## 2022-09-24 NOTE — Progress Notes (Signed)
Daily Progress Note Intern Pager: [phone]  Patient name: [name] Medical record number: [id number] Date of birth: 29-Jul-1956 Age: 67 y.o. Gender: male  Primary Care Provider: Patient, No Pcp Per Consultants: None Code Status: Full Code  Pt Overview and Major Events to Date:  2/22 - admitted   Assessment and Plan: [name] is a 67 y.o. male presenting with cough and SOB.  Past medical history includes ESRD on HD T/TH/Sat, heart failure, well-controlled T2DM, PAD, unilateral BKA, hyperparathyroidism, hypertension.    * Sepsis ([place]) On admission patient meeting 3/4 SIRS criteria with known source of infection (multifocal PNA). WBC down to 10.2 from 17.2 > 13.3. MRSA swab negative. Vancomycin d/c and cefepime continued. Today he is still tachycardic, tachypneic. Bcx NGTD x 2 days.  - Continuous cardiac monitoring - Incentive spirometry - Encourage p.o. hydration - f/u blood cultures x2 - continue cefipime  NSTEMI (non-ST elevated myocardial infarction) (HCC) EKG and trop's obtained 2/2 to patient having hx of CAD. EKG w/o signs of ischemia (Nonspecific ST-T wave changes, T wave inversion noted in the inferior leads ), trop's elevated at [place], cardiology consulted and patient placed on heparin gtt for 48 hrs. No complaints of CP. Cardiology wanting to cont hep and start GDMT, and not candidate for heart cath.  Patient not candidate for Entresto/ACE. -Cardiology following, appreciate recs -Heparin gtt for 48 hrs (2/23-) -Toprolol XL 25 mg  Pneumonia R basilar pneumonia on CXR. Continues to be tachypneic with increased oxygen requirements (88% on 55L HFNC). CTA PE negative for PE, did demonstrate multifocal PNA w/ bilat pleural effusions, confirmed on am CXR. Patient needing to be placed on BiPAP, had asked to not be on it overnight, but is agreeable this am.  - RT eval and treat - Restart BiPAP - Monitor resp status - SPO2 > 92%  Heart failure, unspecified  ([place]) On admission BNP 3189. Echo yesterday demonstrated severe hypokinesis in LV, concerning for LAD disease vs takostubo, EF 30-35%,LV RWMA, G2DD. Patient euvolemic on exam today. Concern for CHF exacerbation, w/ increased respiratory support, and patient weight increase from 73.2 kg to 75.3 kg. Patient to received HD today and Cards recommending volume management w/ HD.  - AM CMP - Strict I's and O's - daily weights   DVT (deep venous thrombosis) (HCC) Patient noted to have age indeterminate DVT in right popliteal vein on [place] yesterday, following recommendation from CTPE study. Patient on heparin gtt for NSTEMI, for 48 hours. Will consider DOAC once transitioned off of heparin.  -Continue Heparin gtt -Consider DOAC in future  ESRD on dialysis [facility]) ESRD on HD T/TH/sat, received dialysis prior to admission (2/22). Patient to received dialysis today. - nephrology following, appreciate recs  Type 2 diabetes mellitus, controlled ([place]) A1c of 5.6 this admission. Glucose in 130-140s yesterday. - CBG 4 times daily before meals and bedtime    FEN/GI: Renal Diet, fluid restriction 1200 ml PPx: Heparin Dispo: Progressive    Subjective:  Comfortable but tachypneaic and willing to go back on BiPAP  Objective: Temp:  [98 F (36.7 C)-99.3 F (37.4 C)] 98.4 F (36.9 C) (02/24 0835) Pulse Rate:  [98-110] 110 (02/24 0835) Resp:  [17-31] 18 (02/24 0835) BP: (120-139)/(70-76) 133/76 (02/24 0835) SpO2:  [86 %-98 %] 88 % (02/24 0846) FiO2 (%):  [60 %-100 %] 60 % (02/24 0932) Weight:  [75.3 kg] 75.3 kg (02/24 0420) Physical Exam: General: Polite, conversant, NAD Cardiovascular: RRR, NRMG Respiratory: Course breath sounds bilaterally, increased  WOB, able to speak in short sentences but does not sound dyspneic Abdomen: Soft, NTTP, non-distended Extremities: No pitting edema  Laboratory: Most recent CBC Lab Results  Component Value Date   WBC 10.2 09/24/2022   HGB 8.7 (L) 09/24/2022    HCT 26.5 (L) 09/24/2022   MCV 97.8 09/24/2022   PLT 181 09/24/2022   Most recent BMP    Latest Ref Rng & Units 09/24/2022    1:09 AM  BMP  Glucose 70 - 99 mg/dL 135   BUN 8 - 23 mg/dL 53   Creatinine 0.61 - 1.24 mg/dL 6.12   Sodium 135 - 145 mmol/L 129   Potassium 3.5 - 5.1 mmol/L 3.8   Chloride 98 - 111 mmol/L 88   CO2 22 - 32 mmol/L 20   Calcium 8.9 - 10.3 mg/dL 7.5     Imaging/Diagnostic Tests: DG Chest Port 1 View  Result Date: 09/24/2022 CLINICAL DATA:  67 year old male with shortness of breath. EXAM: PORTABLE CHEST 1 VIEW COMPARISON:  Chest CTA yesterday and earlier. FINDINGS: Portable AP semi upright view at 0801 hours. Widespread bilateral confluent but indistinct pulmonary opacity appears progressed since 09/22/2022, not significantly changed from the CT yesterday demonstrating a combination of pleural effusions and airspace disease. No pneumothorax. Stable cardiac size and mediastinal contours. Visualized tracheal air column is within normal limits. No acute osseous abnormality identified. IMPRESSION: Bilateral Pneumonia and pleural effusions. Ventilation does not appear significantly changed from the CTA yesterday. Electronically Signed   By: Genevie Ann M.D.   On: 09/24/2022 08:15   VAS [place] LOWER EXTREMITY VENOUS (DVT)  Result Date: 09/23/2022  Lower Venous DVT Study Patient Name:  [name]  Date of Exam:   09/23/2022 Medical Rec #: [id number]     Accession #:    [id number] Date of Birth: 01/02/1956    Patient Gender: M Patient Age:   76 years Exam Location:  [facility] Procedure:      VAS [place] LOWER EXTREMITY VENOUS (DVT) Referring Phys: Chrisandra Netters --------------------------------------------------------------------------------  Indications: Pulmonary embolism, however CT from today showed no evidence of PE.  Limitations: Body habitus, poor ultrasound/tissue interface and calcific shadowing, patient coughing throughout exam. Comparison Study: Previous RLEV was  negative for DVT on 09/14/13 Performing Technologist: Rogelia Rohrer RVT, RDMS  Examination Guidelines: A complete evaluation includes B-mode imaging, spectral Doppler, color Doppler, and power Doppler as needed of all accessible portions of each vessel. Bilateral testing is considered an integral part of a complete examination. Limited examinations for reoccurring indications may be performed as noted. The reflux portion of the exam is performed with the patient in reverse Trendelenburg.  +---------+---------------+---------+-----------+----------+-------------------+ RIGHT    CompressibilityPhasicitySpontaneityPropertiesThrombus Aging      +---------+---------------+---------+-----------+----------+-------------------+ CFV      Full           Yes      Yes                                      +---------+---------------+---------+-----------+----------+-------------------+ SFJ      Full                                                             +---------+---------------+---------+-----------+----------+-------------------+ FV Prox  Full  Yes      Yes                                      +---------+---------------+---------+-----------+----------+-------------------+ FV Mid   Full                                         Not well visualized +---------+---------------+---------+-----------+----------+-------------------+ FV DistalFull           Yes      Yes                                      +---------+---------------+---------+-----------+----------+-------------------+ PFV                     Yes      Yes                                      +---------+---------------+---------+-----------+----------+-------------------+ POP      Partial        Yes      Yes                  Age Indeterminate   +---------+---------------+---------+-----------+----------+-------------------+ PTV                                                   Not well visualized  +---------+---------------+---------+-----------+----------+-------------------+ PERO                                                  Not well visualized +---------+---------------+---------+-----------+----------+-------------------+   +---------+---------------+---------+-----------+----------+-------------------+ LEFT     CompressibilityPhasicitySpontaneityPropertiesThrombus Aging      +---------+---------------+---------+-----------+----------+-------------------+ CFV      Full           Yes      Yes                                      +---------+---------------+---------+-----------+----------+-------------------+ SFJ      Full                                                             +---------+---------------+---------+-----------+----------+-------------------+ FV Prox  Full           Yes      Yes                                      +---------+---------------+---------+-----------+----------+-------------------+ FV Mid   Full  Not well visualized +---------+---------------+---------+-----------+----------+-------------------+ FV DistalFull           Yes      Yes                                      +---------+---------------+---------+-----------+----------+-------------------+ POP      Full           Yes      Yes                                      +---------+---------------+---------+-----------+----------+-------------------+ PTV                                                   BKA                 +---------+---------------+---------+-----------+----------+-------------------+ PERO                                                  BKA                 +---------+---------------+---------+-----------+----------+-------------------+     Summary: BILATERAL: -No evidence of popliteal cyst, bilaterally. RIGHT: - Findings consistent with age indeterminate deep vein thrombosis involving the right  popliteal vein.  LEFT: - There is no evidence of deep vein thrombosis in the lower extremity.  *See table(s) above for measurements and observations. Electronically signed by Orlie Pollen on 09/23/2022 at 6:56:58 PM.    Final    CT Angio Chest Pulmonary Embolism (PE) W or WO Contrast  Result Date: 09/23/2022 CLINICAL DATA:  67 year old male presents for evaluation of the chest, suspicion for pulmonary embolism. EXAM: CT ANGIOGRAPHY CHEST WITH CONTRAST TECHNIQUE: Multidetector CT imaging of the chest was performed using the standard protocol during bolus administration of intravenous contrast. Multiplanar CT image reconstructions and MIPs were obtained to evaluate the vascular anatomy. RADIATION DOSE REDUCTION: This exam was performed according to the departmental dose-optimization program which includes automated exposure control, adjustment of the mA and/or kV according to patient size and/or use of iterative reconstruction technique. CONTRAST:  60m OMNIPAQUE IOHEXOL 350 MG/ML SOLN COMPARISON:  Chest radiograph September 22, 2022 FINDINGS: Cardiovascular: Aortic atherosclerosis both calcified and noncalcified is mild. Aorta is not aneurysmal. Bovine arch branching pattern. The aorta is otherwise not well assessed due to phase of bolus timing. Heart size moderately enlarged. No pericardial effusion or nodularity. Signs of coronary artery disease. Central pulmonary arteries are opacified to 297 Hounsfield units. Study is negative for acute pulmonary embolism to the central segmental level. Subsegmental branches with limited assessment particular in the mid chest due to motion related artifact. Web-like area in LEFT lower lobe pulmonary artery seen on image 144/6 passing through the pulmonary artery is favored to represent sequela of prior embolism. Mediastinum/Nodes: No signs of adenopathy in the chest. No acute process in the mediastinum. Esophagus grossly normal. Lungs/Pleura: Patchy bilateral airspace disease  and basilar consolidation associated with large RIGHT and moderately large LEFT sided pleural effusions. Septal thickening. Airways are patent at the larger airway level, not well evaluated  beyond the segmental level. Upper Abdomen: Incidental imaging of upper abdominal contents without acute process. Musculoskeletal: No acute musculoskeletal process or destructive bone finding. Review of the MIP images confirms the above findings. IMPRESSION: 1. Study is negative for acute pulmonary embolism to the central segmental level. Beyond this level the study is limited by motion artifact particularly in the mid chest. 2. Web-like area in LEFT lower lobe pulmonary artery seen passing through the pulmonary artery is favored to represent sequela of prior embolism. Correlation with Doppler assessment may be helpful to determine whether there may be DVT it might change management. 3. Findings suspicious for multifocal pneumonia associated with bilateral pleural effusions. Concomitant heart failure is also considered. 4. Signs of coronary artery disease. 5. Aortic atherosclerosis. Aortic Atherosclerosis (ICD10-I70.0). These results will be called to the ordering clinician or representative by the Radiologist Assistant, and communication documented in the PACS or [employer]. Electronically Signed   By: Zetta Bills M.D.   On: 09/23/2022 13:26   ECHOCARDIOGRAM COMPLETE  Result Date: 09/23/2022    ECHOCARDIOGRAM REPORT   Patient Name:   [name] Date of Exam: 09/23/2022 Medical Rec #:  [id number]    Height:       63.0 in Accession #:    [id number]   Weight:       161.4 lb Date of Birth:  January 20, 1956   BSA:          1.765 m Patient Age:    67 years     BP:           110/63 mmHg Patient Gender: M            HR:           100 bpm. Exam Location:  Inpatient Procedure: 2D Echo, Cardiac Doppler, Color Doppler and Intracardiac            Opacification Agent Indications:    Dyspnea R06.00  History:        Patient has prior  history of Echocardiogram examinations, most                 recent 04/10/2020. CHF and Cardiomyopathy, PAD; Risk                 Factors:Hypertension and Diabetes. ESRD.  Sonographer:    Ronny Flurry Referring Phys: [place]  1. There is severe hypokinesis of the mid-to-apical septal, mid-to-apical anterior, and mid-to-apical inferior LV segments and apex. No LV thrombus visualized. Findings concerning for possible LAD disease vs takostubo cardiomyopathy. Left ventricular ejection fraction, by estimation, is 30 to 35%. The left ventricle has moderately decreased function. The left ventricle demonstrates regional wall motion abnormalities (see scoring diagram/findings for description). Left ventricular diastolic parameters  are consistent with Grade II diastolic dysfunction (pseudonormalization). The E/e' is 17.2.  2. Right ventricular systolic function is mildly reduced. The right ventricular size is normal.  3. Left atrial size was mildly dilated.  4. The mitral valve is grossly normal. Mild mitral valve regurgitation.  5. The aortic valve is tricuspid. Aortic valve regurgitation is not visualized. Aortic valve sclerosis is present, with no evidence of aortic valve stenosis.  6. The inferior vena cava is dilated in size with <50% respiratory variability, suggesting right atrial pressure of 15 mmHg. Comparison(s): Compared to prior TTE in 2021, the EF has dropped from 50-55% to 30-35% with WMA as detailed above. FINDINGS  Left Ventricle: There is severe hypokinesis of the mid-to-apical septal, mid-to-apical anterior,  and mid-to-apical inferior LV segments and apex. No LV thrombus visualized. Findings concerning for possible LAD disease vs takostubo cardiomyopathy. Left ventricular ejection fraction, by estimation, is 30 to 35%. The left ventricle has moderately decreased function. The left ventricle demonstrates regional wall motion abnormalities. Definity contrast agent was given IV  to delineate the left ventricular endocardial borders. The left ventricular internal cavity size was normal in size. There is no left ventricular hypertrophy. Left ventricular diastolic parameters are consistent with Grade II diastolic dysfunction (pseudonormalization). The E/e' is 17.2. Right Ventricle: The right ventricular size is normal. No increase in right ventricular wall thickness. Right ventricular systolic function is mildly reduced. Left Atrium: Left atrial size was mildly dilated. Right Atrium: Right atrial size was normal in size. Pericardium: There is no evidence of pericardial effusion. Mitral Valve: The mitral valve is grossly normal. There is mild calcification of the mitral valve leaflet(s). Mild mitral valve regurgitation. Tricuspid Valve: The tricuspid valve is normal in structure. Tricuspid valve regurgitation is trivial. Aortic Valve: The aortic valve is tricuspid. Aortic valve regurgitation is not visualized. Aortic valve sclerosis is present, with no evidence of aortic valve stenosis. Aortic valve mean gradient measures 4.0 mmHg. Aortic valve peak gradient measures 7.7  mmHg. Aortic valve area, by VTI measures 2.00 cm. Pulmonic Valve: The pulmonic valve was normal in structure. Pulmonic valve regurgitation is trivial. Aorta: The aortic root and ascending aorta are structurally normal, with no evidence of dilitation. Venous: The inferior vena cava is dilated in size with less than 50% respiratory variability, suggesting right atrial pressure of 15 mmHg. IAS/Shunts: The atrial septum is grossly normal. Additional Comments: There is a small pleural effusion in the left lateral region.  LEFT VENTRICLE PLAX 2D LVIDd:         4.40 cm   Diastology LVIDs:         2.90 cm   LV e' medial:    8.02 cm/s LV PW:         1.10 cm   LV E/e' medial:  17.0 LV IVS:        0.90 cm   LV e' lateral:   8.06 cm/s LVOT diam:     1.80 cm   LV E/e' lateral: 16.9 LV SV:         48 LV SV Index:   27 LVOT Area:     2.54  cm  RIGHT VENTRICLE             IVC RV S prime:     11.70 cm/s  IVC diam: 2.00 cm TAPSE (M-mode): 1.6 cm LEFT ATRIUM           Index        RIGHT ATRIUM           Index LA diam:      3.90 cm 2.21 cm/m   RA Area:     13.50 cm LA Vol (A2C): 52.4 ml 29.69 ml/m  RA Volume:   31.30 ml  17.73 ml/m LA Vol (A4C): 59.6 ml 33.77 ml/m  AORTIC VALVE AV Area (Vmax):    1.95 cm AV Area (Vmean):   1.88 cm AV Area (VTI):     2.00 cm AV Vmax:           139.00 cm/s AV Vmean:          98.200 cm/s AV VTI:            0.239 m AV Peak Grad:  7.7 mmHg AV Mean Grad:      4.0 mmHg LVOT Vmax:         106.25 cm/s LVOT Vmean:        72.425 cm/s LVOT VTI:          0.188 m LVOT/AV VTI ratio: 0.79  AORTA Ao Root diam: 2.90 cm Ao Asc diam:  3.20 cm MITRAL VALVE MV Area (PHT): 7.59 cm     SHUNTS MV Decel Time: 100 msec     Systemic VTI:  0.19 m MV E velocity: 136.00 cm/s  Systemic Diam: 1.80 cm MV A velocity: 67.90 cm/s MV E/A ratio:  2.00 Gwyndolyn Kaufman MD Electronically signed by Gwyndolyn Kaufman MD Signature Date/Time: 09/23/2022/1:03:36 PM    Final     Holley Bouche, MD 09/24/2022, 10:44 AM  PGY-2, [place] Intern pager: [phone], text pages welcome Secure chat group [facility] Teaching Service

## 2022-09-24 NOTE — Assessment & Plan Note (Addendum)
Patient noted to have age indeterminate DVT in right popliteal vein on admission. - continue apixaban 10 mg PO BID x 5 days; then 5 mg PO BID

## 2022-09-24 NOTE — Consult Note (Signed)
NAME:  [name], MRN:  [id number], DOB:  [birth year], LOS: 1 ADMISSION DATE:  09/22/2022, CONSULTATION DATE: 2/24 REFERRING MD: Dr. Marcina Millard, CHIEF COMPLAINT: Lethargy, nonproductive cough  History of Present Illness:  67 year old male who presented to [facility] ER on 2/22 with reports of nonproductive cough for 3 days, lethargy and low oxygen saturations.  Patient is a never smoker.  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  [facility].  Baseline ESRD on HD.  Prior left lower extremity BKA, right transmetatarsal amputation.  Facility noted him to be lethargic and assessed a chest x-ray which was concerning for possible pneumonia.  He reported a nonproductive cough for 3 days.  On EMS arrival saturations were 86% on room air and 4 L per nasal cannula applied with improvement in saturations.  Patient had not missed any hemodialysis treatments.  Initial chest x-ray in the ER concerning for right basilar pneumonia.  Labs notable for mild hyperglycemia, BUN 16, creatinine 3.06, BNP 3189, lactic acid 2.3 which cleared to 1.6, high-sensitivity troponin peak of 6320, WBC 17.2, hemoglobin 9.9, MCV 100.3, platelets 223.  Patient was admitted per family practice teaching service with concern for sepsis in the setting of pneumonia.  Cardiology was consulted for evaluation of acute systolic heart failure.  ECHO showed reduction of LVEF to 30-35% with some concern for possible Takotsubo type cardiomyopathy.  Prior cath showed diffuse disease.  It was felt that GDMT was limited due to ESRD.  Patient required BiPAP support for tachypnea.  CTA PE evaluated which was negative for acute pulmonary embolism, however it was noted that there was a weblike area in the left lower lobe pulmonary artery seen passing through the pulmonary artery which was favored to represent prior embolism, other findings suspicious for multifocal pneumonia and associated bilateral effusions.  Lower extremity venous duplex  assessed which revealed age-indeterminate right popliteal DVT.  PCCM consulted 2/24 pulmonary evaluation.  Pertinent  Medical History  ESRD on HD Diabetes mellitus Hypertension Hyperlipidemia Ischemic and hypertensive cardiomyopathy with a EF of 50 to 55% in 2021 Necrotizing fasciitis Never smoker  Significant Hospital Events: Including procedures, antibiotic start and stop dates in addition to other pertinent events   2/22 admit with right lower lobe pneumonia and sepsis 2/24 PCCM consulted for evaluation of tachypnea, BiPAP requirement  Interim History / Subjective:  Patient reports feeling comfortable on BiPAP, denies chest pain/shortness of breath HD RN reports 3.3 L volume removed Tmax 99.7  Objective   Blood pressure 117/77, pulse 91, temperature 99.7 F (37.6 C), temperature source Oral, resp. rate (!) 32, height '5\' 3"'$  (1.6 m), weight 75.8 kg, SpO2 100 %.    FiO2 (%):  [60 %-100 %] 100 %   Intake/Output Summary (Last 24 hours) at 09/24/2022 1827 Last data filed at 09/24/2022 1718 Gross per 24 hour  Intake 187.97 ml  Output 3300 ml  Net -3112.03 ml   Filed Weights   09/23/22 0100 09/24/22 0420 09/24/22 1407  Weight: 73.2 kg 75.3 kg 75.8 kg    Examination: General: Chronically ill-appearing adult male lying in bed in no acute distress HENT: MM pink/dry, BiPAP mask in place, anicteric Lungs: Tachypnea but not labored, on BiPAP, good air entry bilaterally but diminished Cardiovascular: S1-S2 RRR, no obvious MRG Abdomen: Soft, nontender, nondistended Extremities: Left BKA, right transmetatarsal amputation.  Bilateral lower extremity abrasions Neuro: AAOx4, speech clear, MAE   CXR 0800 > increased effusions, RLL PNA  Resolved Hospital Problem list  Assessment & Plan:   RLL PNA  Acute Hypoxic Respiratory Failure secondary to PNA (NOS) Bilateral Pleural Effusions Non-productive cough prior to admit.  -continue cefepime  -follow up CXR post HD > assess  after volume removal  -assess ABG now -continue BiPAP for now, no increase in work of breathing, mental status normal -wean O2 for sats >90% -pulmonary hygiene -IS, mobilize as able   Acute Systolic CHF, LVEF 99991111 HTN, HLD, Multi-Vessel CAD  -per Cardiology  -continue heparin infusion per pharmacy  -continue lopressor '25mg'$  BID  -lipitor '20mg'$  QD   ESRD on HD  -volume removal per HD   Best Practice (right click and "Reselect all SmartList Selections" daily)  Per PFTS   Labs   CBC: Recent Labs  Lab 09/22/22 1633 09/23/22 0126 09/24/22 0109  WBC 17.2* 13.3* 10.2  NEUTROABS 15.3* 12.0*  --   HGB 9.9* 8.5* 8.7*  HCT 30.9* 26.2* 26.5*  MCV 100.3* 100.8* 97.8  PLT 223 180 0000000    Basic Metabolic Panel: Recent Labs  Lab 09/22/22 1633 09/23/22 0120 09/23/22 0126 09/23/22 1238 09/24/22 0109  NA 135  --  137  --  129*  K 3.5  --  3.9  --  3.8  CL 90*  --  93*  --  88*  CO2 25  --  20*  --  20*  GLUCOSE 125*  --  173*  --  135*  BUN 16  --  28*  --  53*  CREATININE 3.06*  --  4.51*  --  6.12*  CALCIUM 8.5*  --  7.9*  --  7.5*  MG  --  2.1  --   --   --   PHOS  --   --   --  5.0* 5.6*   GFR: Estimated Creatinine Clearance: 11.1 mL/min (A) (by C-G formula based on SCr of 6.12 mg/dL (H)). Recent Labs  Lab 09/22/22 1633 09/22/22 2241 09/23/22 0126 09/24/22 0109  WBC 17.2*  --  13.3* 10.2  LATICACIDVEN 2.3* 1.6  --   --     Liver Function Tests: Recent Labs  Lab 09/22/22 1633 09/23/22 0126 09/24/22 0109  AST 35 26  --   ALT 19 16  --   ALKPHOS 62 51  --   BILITOT 1.4* 1.8*  --   PROT 7.4 6.2*  --   ALBUMIN 3.2* 2.5* 2.3*   No results for input(s): "LIPASE", "AMYLASE" in the last 168 hours. No results for input(s): "AMMONIA" in the last 168 hours.  ABG    Component Value Date/Time   HCO3 25.9 09/23/2022 1653   TCO2 26 07/11/2022 0907   ACIDBASEDEF 2.0 12/13/2019 1509   O2SAT 72.2 09/23/2022 1653     Coagulation Profile: Recent Labs  Lab  09/22/22 1633  INR 1.0    Cardiac Enzymes: No results for input(s): "CKTOTAL", "CKMB", "CKMBINDEX", "TROPONINI" in the last 168 hours.  HbA1C: Hgb A1c MFr Bld  Date/Time Value Ref Range Status  09/23/2022 01:26 AM 5.6 4.8 - 5.6 % Final    Comment:    (NOTE) Pre diabetes:          5.7%-6.4%  Diabetes:              >6.4%  Glycemic control for   <7.0% adults with diabetes   11/29/2019 04:26 AM 5.5 4.8 - 5.6 % Final    Comment:    (NOTE) Pre diabetes:          5.7%-6.4% Diabetes:              >  6.4% Glycemic control for   <7.0% adults with diabetes     CBG: Recent Labs  Lab 09/23/22 1116 09/23/22 1552 09/23/22 2131 09/24/22 0611 09/24/22 1219  GLUCAP 148* 132* 132* 137* 162*    Review of Systems: Positives in [place]  Gen: Denies fever, chills, weight change, fatigue, night sweats HEENT: Denies blurred vision, double vision, hearing loss, tinnitus, sinus congestion, rhinorrhea, sore throat, neck stiffness, dysphagia PULM: Denies shortness of breath, cough, sputum production, hemoptysis, wheezing CV: Denies chest pain, edema, orthopnea, paroxysmal nocturnal dyspnea, palpitations GI: Denies abdominal pain, nausea, vomiting, diarrhea, hematochezia, melena, constipation, change in bowel habits GU: Denies dysuria, hematuria, polyuria, oliguria, urethral discharge Endocrine: Denies hot or cold intolerance, polyuria, polyphagia or appetite change Derm: Denies rash, dry skin, scaling or peeling skin change Heme: Denies easy bruising, bleeding, bleeding gums Neuro: Denies headache, numbness, weakness, slurred speech, loss of memory or consciousness  Past Medical History:  He,  has a past medical history of CHF (congestive heart failure) ([place]), Chronic kidney disease, Diabetes mellitus without complication ([place]), Hepatitis, Hyperlipemia, Hypertension, Hypokalemia, Hyponatremia, Necrotizing fasciitis ([place]), and Pneumonia.   Surgical History:   Past Surgical History:   Procedure Laterality Date   AMPUTATION Left 09/11/2013   Procedure: AMPUTATION BELOW KNEE;  Surgeon: Marianna Payment, MD;  Location: [facility];  Service: Orthopedics;  Laterality: Left;   AMPUTATION Right 12/18/2013   Procedure: RIGHT FOOT 1,2, TOE AMPUTATION  5th toe RAY AMPUTATION;  Surgeon: Marianna Payment, MD;  Location: [facility];  Service: Orthopedics;  Laterality: Right;   AV FISTULA PLACEMENT Left 12/11/2019   Procedure: LEFT BRACHIOCEPHALIC FISTULA CREATION;  Surgeon: Rosetta Posner, MD;  Location: [facility];  Service: Vascular;  Laterality: Left;   CHOLECYSTECTOMY     I & D EXTREMITY Left 09/03/2013   Procedure: IRRIGATION AND DEBRIDEMENT EXTREMITY;  Surgeon: Marianna Payment, MD;  Location: [facility];  Service: Orthopedics;  Laterality: Left;   I & D EXTREMITY Left 09/11/2013   Procedure: LEFT FOOT IRRIGATION AND DEBRIDEMENT;  Surgeon: Marianna Payment, MD;  Location: [facility];  Service: Orthopedics;  Laterality: Left;   INSERTION OF DIALYSIS CATHETER  12/11/2019   Procedure: Insertion Of Dialysis Catheter;  Surgeon: Rosetta Posner, MD;  Location: [facility];  Service: Vascular;;   INSERTION OF DIALYSIS CATHETER Right 07/11/2022   Procedure: INSERTION OF TUNNELED DIALYSIS CATHETER;  Surgeon: Angelia Mould, MD;  Location: [facility];  Service: Vascular;  Laterality: Right;   LEFT HEART CATH AND CORONARY ANGIOGRAPHY N/A 12/13/2019   Procedure: LEFT HEART CATH AND CORONARY ANGIOGRAPHY;  Surgeon: Jettie Booze, MD;  Location: [facility];  Service: Cardiovascular;  Laterality: N/A;   REVISON OF ARTERIOVENOUS FISTULA Left 07/11/2022   Procedure: REVISON OF LEFT ARM ARTERIOVENOUS FISTULA;  Surgeon: Angelia Mould, MD;  Location: [facility];  Service: Vascular;  Laterality: Left;   RIGHT HEART CATH N/A 12/13/2019   Procedure: RIGHT HEART CATH;  Surgeon: Jettie Booze, MD;  Location: [facility] [place] [facility];  Service: Cardiovascular;  Laterality: N/A;     Social History:   reports that he  has never smoked. He has never used smokeless tobacco. He reports that he does not drink alcohol and does not use drugs.   Family History:  His family history includes Dementia in his mother; Diabetes type II in his mother; Heart disease in his father.   Allergies Allergies  Allergen Reactions   Tramadol Nausea And Vomiting    Pt states  he took this on an empty stomach one time and it came right back up. He's not had it since.      Home Medications  Prior to Admission medications   Medication Sig Start Date End Date Taking? Authorizing Provider  amLODipine (NORVASC) 5 MG tablet Take 5 mg by mouth daily. 07/22/21   [provider]  aspirin EC 81 MG tablet Take 81 mg by mouth daily. Swallow whole.    [provider]  atorvastatin (LIPITOR) 20 MG tablet Take 1 tablet (20 mg total) by mouth daily. 12/17/19   [name], MD  carvedilol (COREG) 25 MG tablet Take 1 tablet (25 mg total) by mouth 2 (two) times daily with a meal. Patient taking differently: Take 12.5 mg by mouth 2 (two) times daily with a meal. 12/16/19   Christian, Rylee, MD  hydrocerin (EUCERIN) CREA Apply 1 application topically 2 (two) times daily. 12/16/19   [name], MD  isosorbide mononitrate (IMDUR) 30 MG 24 hr tablet Take 0.5 tablets (15 mg total) by mouth daily. Patient taking differently: Take 30 mg by mouth daily. 12/17/19   [name], MD  Multiple Vitamins-Minerals (MULTIVITAMIN WITH MINERALS) tablet Take 1 tablet by mouth daily with breakfast.    [provider]  oxyCODONE-acetaminophen (PERCOCET) 5-325 MG tablet Take 1 tablet by mouth every 4 (four) hours as needed for severe pain. 07/11/22 07/11/23  Angelia Mould, MD  sevelamer carbonate (RENVELA) 800 MG tablet Take 2 tablets (1,600 mg total) by mouth 3 (three) times daily with meals. 12/16/19   [name], MD         Noe Gens, MSN, APRN, NP-C, AGACNP-BC [facility] 09/24/2022,  6:28 PM   Please see [email or web address] for pager details.   From 7A-7P if no response, please call [phone] After hours, please call ELink [phone]

## 2022-09-24 NOTE — Progress Notes (Signed)
FMTS Interim Progress Note  S: Notified by nursing staff the patient is refusing BiPAP.  Evaluated patient at bedside with Dr. Madison Hickman.  Patient agreed to restart BiPAP after discussion.  O: BP 139/73 (BP Location: Right Arm)   Pulse (!) 102   Temp 98.3 F (36.8 C) (Oral)   Resp 20   Ht '5\' 3"'$  (1.6 m)   Wt 75.3 kg   SpO2 94%   BMI 29.41 kg/m    General: NAD, resting comfortably in bed Respiratory: Increased work of breathing on 10 L HFNC  A/P: Acute respiratory failure  -Continue BiPAP -If respiratory status worsens will likely require CCM consult  Leslie Dales, DO 09/24/2022, 5:32 AM PGY-1, [facility] pager [phone]

## 2022-09-24 NOTE — Progress Notes (Signed)
[facility] for heparin  Indication: chest pain/ACS  Allergies  Allergen Reactions   Tramadol Nausea And Vomiting    Pt states he took this on an empty stomach one time and it came right back up. He's not had it since.     Patient Measurements: Height: '5\' 3"'$  (160 cm) Weight: 73.2 kg (161 lb 6 oz) IBW/kg (Calculated) : 56.9 Heparin Dosing Weight: 71.7kg   Vital Signs: Temp: 98.6 F (37 C) (02/23 2341) Temp Source: Oral (02/23 2341) BP: 132/76 (02/23 2341) Pulse Rate: 99 (02/23 2341)  Labs: Recent Labs    09/22/22 1633 09/23/22 0126 09/23/22 1238 09/23/22 1424 09/23/22 2235  HGB 9.9* 8.5*  --   --   --   HCT 30.9* 26.2*  --   --   --   PLT 223 180  --   --   --   LABPROT 13.5  --   --   --   --   INR 1.0  --   --   --   --   HEPARINUNFRC  --   --   --   --  0.21*  CREATININE 3.06* 4.51*  --   --   --   TROPONINIHS  --   --  5,494* 6,320*  --      Estimated Creatinine Clearance: 14.8 mL/min (A) (by C-G formula based on SCr of 4.51 mg/dL (H)).   Medical History: Past Medical History:  Diagnosis Date   CHF (congestive heart failure) ([place])    Chronic kidney disease    Diabetes mellitus without complication ([place])    PATIENT JUST LEARNED HE WAS DIABETIC   Hepatitis    Hyperlipemia    Hypertension    Hypokalemia    Hyponatremia    Necrotizing fasciitis (H[facility])    Pneumonia    HX OF PNA   Assessment: Patient admitted with CC of cough and SOB. Cardiology consulted for new drop in EF from 50-55% to 30-35%.  Trop elevated to 5500 suspected NSTEMI, cardiology recommending 48 hours of heparin.   Patient ESRD at baseline, no anticoagulation PTA. Did receive heparin subq this AM.   2/24 AM update:  Heparin level sub-therapeutic   Goal of Therapy:  Heparin level 0.3-0.7 units/ml Monitor platelets by anticoagulation protocol: Yes   Plan:  Inc heparin to 1000 units/hr Re-check heparin level in 8 hours  Narda Bonds, PharmD,  [facility] Pharmacist Phone: [phone]

## 2022-09-24 NOTE — Progress Notes (Signed)
ANTICOAGULATION CONSULT NOTE - Initial Consult  Pharmacy Consult for heparin  Indication: chest pain/ACS  Allergies  Allergen Reactions   Tramadol Nausea And Vomiting    Pt states he took this on an empty stomach one time and it came right back up. He's not had it since.     Patient Measurements: Height: '5\' 3"'$  (160 cm) Weight: 75.3 kg (166 lb 0.1 oz) IBW/kg (Calculated) : 56.9 Heparin Dosing Weight: 71.7kg   Vital Signs: Temp: 98.4 F (36.9 C) (02/24 0835) Temp Source: Oral (02/24 0835) BP: 133/76 (02/24 0835) Pulse Rate: 110 (02/24 0835)  Labs: Recent Labs    09/22/22 1633 09/23/22 0126 09/23/22 1238 09/23/22 1424 09/23/22 2235 09/24/22 0109  HGB 9.9* 8.5*  --   --   --  8.7*  HCT 30.9* 26.2*  --   --   --  26.5*  PLT 223 180  --   --   --  181  LABPROT 13.5  --   --   --   --   --   INR 1.0  --   --   --   --   --   HEPARINUNFRC  --   --   --   --  0.21*  --   CREATININE 3.06* 4.51*  --   --   --  6.12*  TROPONINIHS  --   --  5,494* 6,320*  --   --      Estimated Creatinine Clearance: 11.1 mL/min (A) (by C-G formula based on SCr of 6.12 mg/dL (H)).   Medical History: Past Medical History:  Diagnosis Date   CHF (congestive heart failure) ([place])    Chronic kidney disease    Diabetes mellitus without complication ([place])    PATIENT JUST LEARNED HE WAS DIABETIC   Hepatitis    Hyperlipemia    Hypertension    Hypokalemia    Hyponatremia    Necrotizing fasciitis (H[facility])    Pneumonia    HX OF PNA   Assessment: Patient admitted with CC of cough and SOB. Cardiology consulted for new drop in EF from 50-55% to 30-35%.  Trop elevated to 6320 compatible with NSTEMI. Patient ESRD at baseline, no anticoagulation PTA. Cardiology recommending 48 hours of heparin. Heparin started 2/23.   Heparin level 0.3 on drip rate 1000 units/hr borderline therapeutic. First heparin level within range. Hgb 8.7 and plt 181. No s/sx bleeding noted in chart.    Goal of Therapy:  Heparin  level 0.3-0.7 units/ml Monitor platelets by anticoagulation protocol: Yes   Plan:  Increase heparin infusion to 1150 units/hr  Monitor daily heparin level and CBC Continue to monitor H&H     Gena Fray, PharmD PGY1 Pharmacy Resident   09/24/2022 10:16 AM

## 2022-09-24 NOTE — Progress Notes (Signed)
Received patient in bed to unit.  Alert and oriented.  Informed consent signed and in chart.   TX duration:3.0  Patient tolerated well. Pulmonary md in to adjust settings on Bi-pap. Pt will remain in progressive unit.  Transported back to the room  Alert, without acute distress.  Hand-off given to patient's nurse.   Access used: AVF Access issues: none  Total UF removed: 3.3L Medication(s) given: none Post HD VS: 117/77,91,32,100% Post HD weight: 72.5kg   [facility]

## 2022-09-24 NOTE — Plan of Care (Signed)
  Problem: Clinical Measurements: Goal: Respiratory complications will improve Outcome: Not Met (add Reason) Note: Requiring BIPAP to maintain O2 sats above 90%,

## 2022-09-24 NOTE — Progress Notes (Signed)
[facility] Progress Note  Subjective: seen in room, they put him on bipap or cpap today, pt w/o new c/o's. Looks stable on bipap  Vitals:   09/24/22 0835 09/24/22 0846 09/24/22 1049 09/24/22 1220  BP: 133/76   135/73  Pulse: (!) 110   (!) 101  Resp: 18   20  Temp: 98.4 F (36.9 C)   98.3 F (36.8 C)  TempSrc: Oral   Oral  SpO2: 97% (!) 88% 97% 98%  Weight:      Height:        Exam: Gen alert, no distress, on bipap mask No jvd or bruits Chest scattered crackles bilat bases, no wheezing RRR no RG Abd soft ntnd no mass or ascites +bs  MS L BKA, R foot w/ toe amps Ext no pitting LE edema Neuro is alert, Ox 3 , nf    LUA AVF+bruit      Home meds include - coreg 12.5 bid, percocet, MVI, norvasc 5, aspirin, lipitor, imdur 30 qd, renvela 1-2 ac tid     OP HD: TTS G-O 3h 77mn  400/700   72.5kg  2/2.5 bath  LUA AVF  Hep 2000 - last HD 2/22, post wt 72.6kg   - rocaltrol 0.50 mcg po tiw - venofer 50 mg weekly IV - mircera 120 mcg IV q 4 wks, last 2/15, due 2/29       CXR RLL opacities    CT chest - no PE, diffuse bilat LL opacities w/ some pleural effusions   Assessment/ Plan: Sepsis / bilat PNA - on IV abx per pmd Acute systolic HF / NSTEMI - +trop 5500, getting IV heparin per cardiology. LVEF 30-35% by echo here.  ESRD - on HD TTS. Has not missed OP HD. HD today upstairs  HTN/ volume - BP's normal, getting metop 25 qd. Not grossly overloaded on exam. Imaging no pulm edema, +pleural effusions. 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 Follow.  MBD ckd - CCa and phos in range. Cont po vdra.     RKelly SplinterMD [facility] 09/24/2022, 1:41 PM  Recent Labs  Lab 09/23/22 0126 09/23/22 1238 09/24/22 0109  HGB 8.5*  --  8.7*  ALBUMIN 2.5*  --  2.3*  CALCIUM 7.9*  --  7.5*  PHOS  --  5.0* 5.6*  CREATININE 4.51*  --  6.12*  K 3.9  --  3.8   No results for input(s): "IRON", "TIBC",  "FERRITIN" in the last 168 hours. Inpatient medications:  aspirin EC  81 mg Oral Daily   atorvastatin  20 mg Oral Daily   calcitRIOL  0.5 mcg Oral Q T,Th,Sa-HD   Chlorhexidine Gluconate Cloth  6 each Topical Daily   Chlorhexidine Gluconate Cloth  6 each Topical Q0600   metoprolol succinate  25 mg Oral Daily   sevelamer carbonate  800 mg Oral TID WC    ceFEPime (MAXIPIME) IV Stopped (09/23/22 2034)   heparin 1,150 Units/hr (09/24/22 1047)

## 2022-09-25 DIAGNOSIS — N186 End stage renal disease: Secondary | ICD-10-CM | POA: Diagnosis not present

## 2022-09-25 DIAGNOSIS — I2510 Atherosclerotic heart disease of native coronary artery without angina pectoris: Secondary | ICD-10-CM | POA: Diagnosis not present

## 2022-09-25 DIAGNOSIS — I214 Non-ST elevation (NSTEMI) myocardial infarction: Secondary | ICD-10-CM | POA: Diagnosis not present

## 2022-09-25 DIAGNOSIS — I5021 Acute systolic (congestive) heart failure: Secondary | ICD-10-CM | POA: Diagnosis not present

## 2022-09-25 DIAGNOSIS — Z992 Dependence on renal dialysis: Secondary | ICD-10-CM

## 2022-09-25 DIAGNOSIS — I824Z9 Acute embolism and thrombosis of unspecified deep veins of unspecified distal lower extremity: Secondary | ICD-10-CM | POA: Diagnosis not present

## 2022-09-25 DIAGNOSIS — J189 Pneumonia, unspecified organism: Secondary | ICD-10-CM | POA: Diagnosis not present

## 2022-09-25 LAB — GLUCOSE, CAPILLARY
Glucose-Capillary: 143 mg/dL — ABNORMAL HIGH (ref 70–99)
Glucose-Capillary: 183 mg/dL — ABNORMAL HIGH (ref 70–99)
Glucose-Capillary: 152 mg/dL — ABNORMAL HIGH (ref 70–99)
Glucose-Capillary: 196 mg/dL — ABNORMAL HIGH (ref 70–99)

## 2022-09-25 LAB — CBC
HCT: 27.8 % — ABNORMAL LOW (ref 39.0–52.0)
Hemoglobin: 9.1 g/dL — ABNORMAL LOW (ref 13.0–17.0)
MCH: 32.3 pg (ref 26.0–34.0)
MCHC: 32.7 g/dL (ref 30.0–36.0)
MCV: 98.6 fL (ref 80.0–100.0)
Platelets: 185 10*3/uL (ref 150–400)
RBC: 2.82 MIL/uL — ABNORMAL LOW (ref 4.22–5.81)
RDW: 15.9 % — ABNORMAL HIGH (ref 11.5–15.5)
WBC: 12.2 10*3/uL — ABNORMAL HIGH (ref 4.0–10.5)
nRBC: 0.2 % (ref 0.0–0.2)

## 2022-09-25 LAB — RENAL FUNCTION PANEL
Albumin: 2.3 g/dL — ABNORMAL LOW (ref 3.5–5.0)
Anion gap: 20 — ABNORMAL HIGH (ref 5–15)
BUN: 34 mg/dL — ABNORMAL HIGH (ref 8–23)
CO2: 21 mmol/L — ABNORMAL LOW (ref 22–32)
Calcium: 8.1 mg/dL — ABNORMAL LOW (ref 8.9–10.3)
Chloride: 92 mmol/L — ABNORMAL LOW (ref 98–111)
Creatinine, Ser: 4.8 mg/dL — ABNORMAL HIGH (ref 0.61–1.24)
GFR, Estimated: 13 mL/min — ABNORMAL LOW (ref >60)
Glucose, Bld: 132 mg/dL — ABNORMAL HIGH (ref 70–99)
Phosphorus: 4.5 mg/dL (ref 2.5–4.6)
Potassium: 4.4 mmol/L (ref 3.5–5.1)
Sodium: 133 mmol/L — ABNORMAL LOW (ref 135–145)

## 2022-09-25 LAB — HEPARIN LEVEL (UNFRACTIONATED): Heparin Unfractionated: 0.46 IU/mL (ref 0.30–0.70)

## 2022-09-25 MED ORDER — APIXABAN 5 MG PO TABS
5.0000 mg | ORAL_TABLET | Freq: Two times a day (BID) | ORAL | Status: DC
  Administered 2022-09-30 – 2022-10-06 (×12): 5 mg via ORAL
  Filled 2022-09-25 (×13): qty 1

## 2022-09-25 MED ORDER — CHLORHEXIDINE GLUCONATE CLOTH 2 % EX PADS
6.0000 | MEDICATED_PAD | Freq: Every day | CUTANEOUS | Status: DC
  Administered 2022-09-27 – 2022-10-06 (×8): 6 via TOPICAL

## 2022-09-25 MED ORDER — APIXABAN 5 MG PO TABS
10.0000 mg | ORAL_TABLET | Freq: Two times a day (BID) | ORAL | Status: AC
  Administered 2022-09-25 – 2022-09-30 (×9): 10 mg via ORAL
  Filled 2022-09-25 (×9): qty 2

## 2022-09-25 NOTE — Plan of Care (Signed)
  Problem: Clinical Measurements: Goal: Diagnostic test results will improve Outcome: Progressing Goal: Respiratory complications will improve Outcome: Progressing Goal: Cardiovascular complication will be avoided Outcome: Progressing   

## 2022-09-25 NOTE — Progress Notes (Signed)
Pharmacy Antibiotic Note  [name] is a 67 y.o. male for which pharmacy has been admitted with pneumonia. Patient with a history of ESRD on HD TTS. Patient presenting with persistent productive cough. Pharmacy consulted to dose cefepime.   Patient on BiPAP. CXR shows improving bilateral airspace disease. Wbc 12.2 and afebrile.   Plan: Continue cefepime 1g IV q24h  Monitor renal function, culture results, clinical status and resolution of s/sx infection Narrow abx as able and f/u with appropriate duration   Height: '5\' 3"'$  (160 cm) Weight: 71.4 kg (157 lb 6.5 oz) IBW/kg (Calculated) : 56.9  Temp (24hrs), Avg:99.2 F (37.3 C), Min:98.3 F (36.8 C), Max:99.8 F (37.7 C)  Recent Labs  Lab 09/22/22 1633 09/22/22 2241 09/23/22 0126 09/24/22 0109 09/25/22 0040  WBC 17.2*  --  13.3* 10.2 12.2*  CREATININE 3.06*  --  4.51* 6.12* 4.80*  LATICACIDVEN 2.3* 1.6  --   --   --      Estimated Creatinine Clearance: 13.8 mL/min (A) (by C-G formula based on SCr of 4.8 mg/dL (H)).    Allergies  Allergen Reactions   Tramadol Nausea And Vomiting    Pt states he took this on an empty stomach one time and it came right back up. He's not had it since.    Microbiology results: 2/22 Resp Panel: neg  2/22 MRSA PCR: neg  2/22 Bcx: NGTD x3    Thank you for allowing pharmacy to be a part of this patient's care.  Gena Fray, PharmD PGY1 Pharmacy Resident   09/25/2022 8:18 AM

## 2022-09-25 NOTE — Progress Notes (Addendum)
Daily Progress Note Intern Pager: [phone]  Patient name: [name] Medical record number: [id number] Date of birth: 04/01/1956 Age: 67 y.o. Gender: male  Primary Care Provider: Patient, No Pcp Per Consultants: None Code Status: FULL  Pt Overview and Major Events to Date:  2/22 - admitted 2/23 - NSTEMI - heparin gtt started, requiring BiPAP due to increased WOB  Assessment and Plan: [name] is a 67 y.o. male presenting with cough and SOB.  Past medical history includes ESRD on HD T/TH/Sat, heart failure, well-controlled T2DM, PAD, unilateral BKA, hyperparathyroidism, hypertension.    * Sepsis ([place]) On admission patient meeting 3/4 SIRS criteria with known source of infection (multifocal PNA). WBC down to 12.2 from 17.2 initially. MRSA swab negative. Vancomycin d/c and cefepime continued. Today he is still tachycardic, tachypneic. Bcx NGTD x 3 days.  - Continuous cardiac monitoring - Incentive spirometry - Encourage p.o. hydration - f/u blood cultures x2 - continue cefipime - PCCM following, appreciate recs  NSTEMI (non-ST elevated myocardial infarction) ([place]) EKG and trop's obtained 2/2 to patient having hx of CAD. EKG w/o signs of ischemia (Nonspecific ST-T wave changes, T wave inversion noted in the inferior leads ), trop's elevated at [place], cardiology consulted and patient placed on heparin gtt for 48 hrs. No complaints of CP. Cardiology wanting to cont hep and start GDMT, and not candidate for heart cath.  Patient not candidate for Entresto/ACE. -Cardiology following, appreciate recs -Heparin gtt for 48 hrs (2/23-2/25) -ToproL XL 25 mg  Pneumonia R basilar pneumonia on CXR. Currently on BiPAP. CTA PE negative for PE, did demonstrate multifocal PNA w/ bilat pleural effusions, confirmed on am CXR. Some improvement on CXR after HD. ABG not collected yet. On 6 L HFNC this morning-mild work of breathing but saturating at 92-94% - RT eval and treat - BiPAP  prn - Continue cefepime - Monitor resp status - SPO2 > 92% - Appreciate PCCM recs  Heart failure, unspecified ([place]) On admission BNP 3189. Echo yesterday demonstrated severe hypokinesis in LV, concerning for LAD disease vs takostubo, EF 30-35% (previously 50-55% on echo in 2021),LV RWMA, G2DD. Patient euvolemic on exam today. Weight down from 71.4 kg from 75 kg yesterday after HD. Cards recommending volume management w/ HD.  - AM CMP - Strict I's and O's - daily weights   DVT (deep venous thrombosis) (HCC) Patient noted to have age indeterminate DVT in right popliteal vein on [place] yesterday, following recommendation from CTPE study. Patient on heparin gtt for NSTEMI, for 48 hours. Will consider DOAC once transitioned off of heparin.  -Continue Heparin gtt -Consider DOAC in future  ESRD on dialysis ([place]) ESRD on HD T/TH/sat, received dialysis yesterday. - nephrology following, appreciate recs  Type 2 diabetes mellitus, controlled ([place]) A1c of 5.6 this admission. Glucose in 130-140s yesterday. - CBG 4 times daily before meals and bedtime   FEN/GI: Renal PPx: heparin gtt Dispo:LTC pending respiratory status improvement  Subjective:  Just got taken off his BiPAP and is put on 6 L high flow nasal cannula, he says that his breathing has been okay.  Denies any chest pain.  Objective: Temp:  [98.3 F (36.8 C)-99.8 F (37.7 C)] 99.1 F (37.3 C) (02/25 0526) Pulse Rate:  [88-101] 94 (02/25 0854) Resp:  [20-41] 20 (02/25 0854) BP: (94-140)/(65-82) 135/82 (02/25 0734) SpO2:  [92 %-100 %] 97 % (02/25 0854) FiO2 (%):  [70 %-100 %] 70 % (02/24 2050) Weight:  [71.4 kg-75.8 kg] 71.4 kg (02/25  0526) Physical Exam: General: NAD, alert and responsive to all questions Cardiovascular: Regular rate and rhythm, no murmurs rubs or gallops Respiratory: Anterior lung fields with coarse crackles throughout, mild increased work of breathing on 6 L nasal cannula, no nasal flaring, able to respond in  short sentences Abdomen: Soft, nontender Extremities: Left BKA, right leg without edema, no tenderness to palpation of calf, non bleeding excoriations present on lower extremity  Laboratory: Most recent CBC Lab Results  Component Value Date   WBC 12.2 (H) 09/25/2022   HGB 9.1 (L) 09/25/2022   HCT 27.8 (L) 09/25/2022   MCV 98.6 09/25/2022   PLT 185 09/25/2022   Most recent BMP    Latest Ref Rng & Units 09/25/2022   12:40 AM  BMP  Glucose 70 - 99 mg/dL 132   BUN 8 - 23 mg/dL 34   Creatinine 0.61 - 1.24 mg/dL 4.80   Sodium 135 - 145 mmol/L 133   Potassium 3.5 - 5.1 mmol/L 4.4   Chloride 98 - 111 mmol/L 92   CO2 22 - 32 mmol/L 21   Calcium 8.9 - 10.3 mg/dL 8.1     Imaging/Diagnostic Tests: CXR after HD-improving, persistent bilateral opacities  Gerrit Heck, MD 09/25/2022, 9:39 AM  PGY-2, [place] Intern pager: [phone], text pages welcome Secure chat group [place] PAGER [phone] for any questions or notifications regarding this patient   FMTS Attending Daily Note: Dorcas Mcmurray MD  Attending pager:[phone]  office [phone]  I  have seen and examined this patient, reviewed their chart. I have discussed this patient with the resident. I agree with the resident's findings, assessment and care plan.

## 2022-09-25 NOTE — Progress Notes (Signed)
Rounding Note    Patient Name: Joran Ulin Date of Encounter: 09/25/2022  [facility] Cardiologist: Elouise Munroe, MD   Subjective   Denies chest pain.Remains on BiPAP  3.3L removed with HD  Inpatient Medications    Scheduled Meds:  aspirin EC  81 mg Oral Daily   atorvastatin  20 mg Oral Daily   calcitRIOL  0.5 mcg Oral Q T,Th,Sa-HD   Chlorhexidine Gluconate Cloth  6 each Topical Daily   Chlorhexidine Gluconate Cloth  6 each Topical Q0600   metoprolol succinate  25 mg Oral Daily   sevelamer carbonate  800 mg Oral TID WC   Continuous Infusions:  ceFEPime (MAXIPIME) IV 1 g (09/24/22 2020)   heparin 1,150 Units/hr (09/24/22 1528)   PRN Meds:    Vital Signs    Vitals:   09/24/22 2050 09/25/22 0016 09/25/22 0526 09/25/22 0734  BP:  122/78 125/66 135/82  Pulse: 97 93 88 94  Resp:  '20 20 20  '$ Temp:  99.6 F (37.6 C) 99.1 F (37.3 C)   TempSrc:  Axillary Axillary   SpO2: 98% 100% 100% 100%  Weight:   71.4 kg   Height:        Intake/Output Summary (Last 24 hours) at 09/25/2022 0824 Last data filed at 09/24/2022 1718 Gross per 24 hour  Intake --  Output 3300 ml  Net -3300 ml       09/25/2022    5:26 AM 09/24/2022    2:07 PM 09/24/2022    4:20 AM  Last 3 Weights  Weight (lbs) 157 lb 6.5 oz 167 lb 1.7 oz 166 lb 0.1 oz  Weight (kg) 71.4 kg 75.8 kg 75.3 kg      Telemetry    NSR - Personally Reviewed  ECG    No new tracing today - Personally Reviewed  Physical Exam   GEN: No acute distress.   Neck: No JVD Cardiac: RRR, no murmurs Respiratory: BiPAP in place GI: Soft, nontender, non-distended  MS: Left BKA. No edema on right Neuro:  Nonfocal  Psych: Normal affect   Labs    High Sensitivity Troponin:   Recent Labs  Lab 09/23/22 1238 09/23/22 1424  TROPONINIHS 5,494* 6,320*      Chemistry Recent Labs  Lab 09/22/22 1633 09/23/22 0120 09/23/22 0126 09/24/22 0109 09/25/22 0040  NA 135  --  137 129* 133*  K 3.5  --  3.9  3.8 4.4  CL 90*  --  93* 88* 92*  CO2 25  --  20* 20* 21*  GLUCOSE 125*  --  173* 135* 132*  BUN 16  --  28* 53* 34*  CREATININE 3.06*  --  4.51* 6.12* 4.80*  CALCIUM 8.5*  --  7.9* 7.5* 8.1*  MG  --  2.1  --   --   --   PROT 7.4  --  6.2*  --   --   ALBUMIN 3.2*  --  2.5* 2.3* 2.3*  AST 35  --  26  --   --   ALT 19  --  16  --   --   ALKPHOS 62  --  51  --   --   BILITOT 1.4*  --  1.8*  --   --   GFRNONAA 22*  --  14* 10* 13*  ANIONGAP 20*  --  24* 21* 20*     Lipids No results for input(s): "CHOL", "TRIG", "HDL", "LABVLDL", "LDLCALC", "CHOLHDL" in the last 168 hours.  Hematology Recent Labs  Lab 09/23/22 0126 09/24/22 0109 09/25/22 0040  WBC 13.3* 10.2 12.2*  RBC 2.60* 2.71* 2.82*  HGB 8.5* 8.7* 9.1*  HCT 26.2* 26.5* 27.8*  MCV 100.8* 97.8 98.6  MCH 32.7 32.1 32.3  MCHC 32.4 32.8 32.7  RDW 15.9* 16.0* 15.9*  PLT 180 181 185    Thyroid No results for input(s): "TSH", "FREET4" in the last 168 hours.  BNP Recent Labs  Lab 09/22/22 1633  BNP 3,189.2*     DDimer No results for input(s): "DDIMER" in the last 168 hours.   Radiology    DG CHEST PORT 1 VIEW  Result Date: 09/24/2022 CLINICAL DATA:  Acute respiratory failure with hypoxia. EXAM: PORTABLE CHEST 1 VIEW COMPARISON:  Radiograph earlier today. CT yesterday. FINDINGS: Improving bilateral airspace disease. Persistent bilateral pleural effusions as well as basilar and perihilar opacities. Stable heart size and mediastinal contours. Left axillary stent. No pneumothorax. IMPRESSION: 1. Improving bilateral airspace disease, may represent improving pulmonary edema or infection. 2. Persistent bilateral pleural effusions and basilar and perihilar opacities. Electronically Signed   By: Keith Rake M.D.   On: 09/24/2022 20:39   DG Chest Port 1 View  Result Date: 09/24/2022 CLINICAL DATA:  67 year old male with shortness of breath. EXAM: PORTABLE CHEST 1 VIEW COMPARISON:  Chest CTA yesterday and earlier. FINDINGS:  Portable AP semi upright view at 0801 hours. Widespread bilateral confluent but indistinct pulmonary opacity appears progressed since 09/22/2022, not significantly changed from the CT yesterday demonstrating a combination of pleural effusions and airspace disease. No pneumothorax. Stable cardiac size and mediastinal contours. Visualized tracheal air column is within normal limits. No acute osseous abnormality identified. IMPRESSION: Bilateral Pneumonia and pleural effusions. Ventilation does not appear significantly changed from the CTA yesterday. Electronically Signed   By: Genevie Ann M.D.   On: 09/24/2022 08:15   VAS [place] LOWER EXTREMITY VENOUS (DVT)  Result Date: 09/23/2022  Lower Venous DVT Study Patient Name:  RHYSON TRAUGHBER  Date of Exam:   09/23/2022 Medical Rec #: [id number]     Accession #:    [id number] Date of Birth: May 31, 1956    Patient Gender: M Patient Age:   67 years Exam Location:  [facility] Procedure:      VAS [place] LOWER EXTREMITY VENOUS (DVT) Referring Phys: Chrisandra Netters --------------------------------------------------------------------------------  Indications: Pulmonary embolism, however CT from today showed no evidence of PE.  Limitations: Body habitus, poor ultrasound/tissue interface and calcific shadowing, patient coughing throughout exam. Comparison Study: Previous RLEV was negative for DVT on 09/14/13 Performing Technologist: Rogelia Rohrer RVT, RDMS  Examination Guidelines: A complete evaluation includes B-mode imaging, spectral Doppler, color Doppler, and power Doppler as needed of all accessible portions of each vessel. Bilateral testing is considered an integral part of a complete examination. Limited examinations for reoccurring indications may be performed as noted. The reflux portion of the exam is performed with the patient in reverse Trendelenburg.  +---------+---------------+---------+-----------+----------+-------------------+ RIGHT     CompressibilityPhasicitySpontaneityPropertiesThrombus Aging      +---------+---------------+---------+-----------+----------+-------------------+ CFV      Full           Yes      Yes                                      +---------+---------------+---------+-----------+----------+-------------------+ SFJ      Full                                                             +---------+---------------+---------+-----------+----------+-------------------+  FV Prox  Full           Yes      Yes                                      +---------+---------------+---------+-----------+----------+-------------------+ FV Mid   Full                                         Not well visualized +---------+---------------+---------+-----------+----------+-------------------+ FV DistalFull           Yes      Yes                                      +---------+---------------+---------+-----------+----------+-------------------+ PFV                     Yes      Yes                                      +---------+---------------+---------+-----------+----------+-------------------+ POP      Partial        Yes      Yes                  Age Indeterminate   +---------+---------------+---------+-----------+----------+-------------------+ PTV                                                   Not well visualized +---------+---------------+---------+-----------+----------+-------------------+ PERO                                                  Not well visualized +---------+---------------+---------+-----------+----------+-------------------+   +---------+---------------+---------+-----------+----------+-------------------+ LEFT     CompressibilityPhasicitySpontaneityPropertiesThrombus Aging      +---------+---------------+---------+-----------+----------+-------------------+ CFV      Full           Yes      Yes                                       +---------+---------------+---------+-----------+----------+-------------------+ SFJ      Full                                                             +---------+---------------+---------+-----------+----------+-------------------+ FV Prox  Full           Yes      Yes                                      +---------+---------------+---------+-----------+----------+-------------------+ FV Mid   Full  Not well visualized +---------+---------------+---------+-----------+----------+-------------------+ FV DistalFull           Yes      Yes                                      +---------+---------------+---------+-----------+----------+-------------------+ POP      Full           Yes      Yes                                      +---------+---------------+---------+-----------+----------+-------------------+ PTV                                                   BKA                 +---------+---------------+---------+-----------+----------+-------------------+ PERO                                                  BKA                 +---------+---------------+---------+-----------+----------+-------------------+     Summary: BILATERAL: -No evidence of popliteal cyst, bilaterally. RIGHT: - Findings consistent with age indeterminate deep vein thrombosis involving the right popliteal vein.  LEFT: - There is no evidence of deep vein thrombosis in the lower extremity.  *See table(s) above for measurements and observations. Electronically signed by Orlie Pollen on 09/23/2022 at 6:56:58 PM.    Final    CT Angio Chest Pulmonary Embolism (PE) W or WO Contrast  Result Date: 09/23/2022 CLINICAL DATA:  67 year old male presents for evaluation of the chest, suspicion for pulmonary embolism. EXAM: CT ANGIOGRAPHY CHEST WITH CONTRAST TECHNIQUE: Multidetector CT imaging of the chest was performed using the standard protocol during bolus  administration of intravenous contrast. Multiplanar CT image reconstructions and MIPs were obtained to evaluate the vascular anatomy. RADIATION DOSE REDUCTION: This exam was performed according to the departmental dose-optimization program which includes automated exposure control, adjustment of the mA and/or kV according to patient size and/or use of iterative reconstruction technique. CONTRAST:  30m OMNIPAQUE IOHEXOL 350 MG/ML SOLN COMPARISON:  Chest radiograph September 22, 2022 FINDINGS: Cardiovascular: Aortic atherosclerosis both calcified and noncalcified is mild. Aorta is not aneurysmal. Bovine arch branching pattern. The aorta is otherwise not well assessed due to phase of bolus timing. Heart size moderately enlarged. No pericardial effusion or nodularity. Signs of coronary artery disease. Central pulmonary arteries are opacified to 297 Hounsfield units. Study is negative for acute pulmonary embolism to the central segmental level. Subsegmental branches with limited assessment particular in the mid chest due to motion related artifact. Web-like area in LEFT lower lobe pulmonary artery seen on image 144/6 passing through the pulmonary artery is favored to represent sequela of prior embolism. Mediastinum/Nodes: No signs of adenopathy in the chest. No acute process in the mediastinum. Esophagus grossly normal. Lungs/Pleura: Patchy bilateral airspace disease and basilar consolidation associated with large RIGHT and moderately large LEFT sided pleural effusions. Septal thickening. Airways are patent at the larger airway level, not well evaluated beyond  the segmental level. Upper Abdomen: Incidental imaging of upper abdominal contents without acute process. Musculoskeletal: No acute musculoskeletal process or destructive bone finding. Review of the MIP images confirms the above findings. IMPRESSION: 1. Study is negative for acute pulmonary embolism to the central segmental level. Beyond this level the study is  limited by motion artifact particularly in the mid chest. 2. Web-like area in LEFT lower lobe pulmonary artery seen passing through the pulmonary artery is favored to represent sequela of prior embolism. Correlation with Doppler assessment may be helpful to determine whether there may be DVT it might change management. 3. Findings suspicious for multifocal pneumonia associated with bilateral pleural effusions. Concomitant heart failure is also considered. 4. Signs of coronary artery disease. 5. Aortic atherosclerosis. Aortic Atherosclerosis (ICD10-I70.0). These results will be called to the ordering clinician or representative by the Radiologist Assistant, and communication documented in the PACS or [employer]. Electronically Signed   By: Zetta Bills M.D.   On: 09/23/2022 13:26   ECHOCARDIOGRAM COMPLETE  Result Date: 09/23/2022    ECHOCARDIOGRAM REPORT   Patient Name:   JAROME MAZOR Date of Exam: 09/23/2022 Medical Rec #:  [id number]    Height:       63.0 in Accession #:    [id number]   Weight:       161.4 lb Date of Birth:  10/14/1955   BSA:          1.765 m Patient Age:    37 years     BP:           110/63 mmHg Patient Gender: M            HR:           100 bpm. Exam Location:  Inpatient Procedure: 2D Echo, Cardiac Doppler, Color Doppler and Intracardiac            Opacification Agent Indications:    Dyspnea R06.00  History:        Patient has prior history of Echocardiogram examinations, most                 recent 04/10/2020. CHF and Cardiomyopathy, PAD; Risk                 Factors:Hypertension and Diabetes. ESRD.  Sonographer:    Ronny Flurry Referring Phys: Bishop  1. There is severe hypokinesis of the mid-to-apical septal, mid-to-apical anterior, and mid-to-apical inferior LV segments and apex. No LV thrombus visualized. Findings concerning for possible LAD disease vs takostubo cardiomyopathy. Left ventricular ejection fraction, by estimation, is 30 to 35%. The  left ventricle has moderately decreased function. The left ventricle demonstrates regional wall motion abnormalities (see scoring diagram/findings for description). Left ventricular diastolic parameters  are consistent with Grade II diastolic dysfunction (pseudonormalization). The E/e' is 17.2.  2. Right ventricular systolic function is mildly reduced. The right ventricular size is normal.  3. Left atrial size was mildly dilated.  4. The mitral valve is grossly normal. Mild mitral valve regurgitation.  5. The aortic valve is tricuspid. Aortic valve regurgitation is not visualized. Aortic valve sclerosis is present, with no evidence of aortic valve stenosis.  6. The inferior vena cava is dilated in size with <50% respiratory variability, suggesting right atrial pressure of 15 mmHg. Comparison(s): Compared to prior TTE in 2021, the EF has dropped from 50-55% to 30-35% with WMA as detailed above. FINDINGS  Left Ventricle: There is severe hypokinesis of the mid-to-apical septal, mid-to-apical anterior,  and mid-to-apical inferior LV segments and apex. No LV thrombus visualized. Findings concerning for possible LAD disease vs takostubo cardiomyopathy. Left ventricular ejection fraction, by estimation, is 30 to 35%. The left ventricle has moderately decreased function. The left ventricle demonstrates regional wall motion abnormalities. Definity contrast agent was given IV to delineate the left ventricular endocardial borders. The left ventricular internal cavity size was normal in size. There is no left ventricular hypertrophy. Left ventricular diastolic parameters are consistent with Grade II diastolic dysfunction (pseudonormalization). The E/e' is 17.2. Right Ventricle: The right ventricular size is normal. No increase in right ventricular wall thickness. Right ventricular systolic function is mildly reduced. Left Atrium: Left atrial size was mildly dilated. Right Atrium: Right atrial size was normal in size.  Pericardium: There is no evidence of pericardial effusion. Mitral Valve: The mitral valve is grossly normal. There is mild calcification of the mitral valve leaflet(s). Mild mitral valve regurgitation. Tricuspid Valve: The tricuspid valve is normal in structure. Tricuspid valve regurgitation is trivial. Aortic Valve: The aortic valve is tricuspid. Aortic valve regurgitation is not visualized. Aortic valve sclerosis is present, with no evidence of aortic valve stenosis. Aortic valve mean gradient measures 4.0 mmHg. Aortic valve peak gradient measures 7.7  mmHg. Aortic valve area, by VTI measures 2.00 cm. Pulmonic Valve: The pulmonic valve was normal in structure. Pulmonic valve regurgitation is trivial. Aorta: The aortic root and ascending aorta are structurally normal, with no evidence of dilitation. Venous: The inferior vena cava is dilated in size with less than 50% respiratory variability, suggesting right atrial pressure of 15 mmHg. IAS/Shunts: The atrial septum is grossly normal. Additional Comments: There is a small pleural effusion in the left lateral region.  LEFT VENTRICLE PLAX 2D LVIDd:         4.40 cm   Diastology LVIDs:         2.90 cm   LV e' medial:    8.02 cm/s LV PW:         1.10 cm   LV E/e' medial:  17.0 LV IVS:        0.90 cm   LV e' lateral:   8.06 cm/s LVOT diam:     1.80 cm   LV E/e' lateral: 16.9 LV SV:         48 LV SV Index:   27 LVOT Area:     2.54 cm  RIGHT VENTRICLE             IVC RV S prime:     11.70 cm/s  IVC diam: 2.00 cm TAPSE (M-mode): 1.6 cm LEFT ATRIUM           Index        RIGHT ATRIUM           Index LA diam:      3.90 cm 2.21 cm/m   RA Area:     13.50 cm LA Vol (A2C): 52.4 ml 29.69 ml/m  RA Volume:   31.30 ml  17.73 ml/m LA Vol (A4C): 59.6 ml 33.77 ml/m  AORTIC VALVE AV Area (Vmax):    1.95 cm AV Area (Vmean):   1.88 cm AV Area (VTI):     2.00 cm AV Vmax:           139.00 cm/s AV Vmean:          98.200 cm/s AV VTI:            0.239 m AV Peak Grad:      7.7  mmHg AV  Mean Grad:      4.0 mmHg LVOT Vmax:         106.25 cm/s LVOT Vmean:        72.425 cm/s LVOT VTI:          0.188 m LVOT/AV VTI ratio: 0.79  AORTA Ao Root diam: 2.90 cm Ao Asc diam:  3.20 cm MITRAL VALVE MV Area (PHT): 7.59 cm     SHUNTS MV Decel Time: 100 msec     Systemic VTI:  0.19 m MV E velocity: 136.00 cm/s  Systemic Diam: 1.80 cm MV A velocity: 67.90 cm/s MV E/A ratio:  2.00 Gwyndolyn Kaufman MD Electronically signed by Gwyndolyn Kaufman MD Signature Date/Time: 09/23/2022/1:03:36 PM    Final     Cardiac Studies   TTE 09/23/22: IMPRESSIONS     1. There is severe hypokinesis of the mid-to-apical septal, mid-to-apical  anterior, and mid-to-apical inferior LV segments and apex. No LV thrombus  visualized. Findings concerning for possible LAD disease vs takostubo  cardiomyopathy. Left ventricular  ejection fraction, by estimation, is 30 to 35%. The left ventricle has  moderately decreased function. The left ventricle demonstrates regional  wall motion abnormalities (see scoring diagram/findings for description).  Left ventricular diastolic parameters   are consistent with Grade II diastolic dysfunction (pseudonormalization).  The E/e' is 17.2.   2. Right ventricular systolic function is mildly reduced. The right  ventricular size is normal.   3. Left atrial size was mildly dilated.   4. The mitral valve is grossly normal. Mild mitral valve regurgitation.   5. The aortic valve is tricuspid. Aortic valve regurgitation is not  visualized. Aortic valve sclerosis is present, with no evidence of aortic  valve stenosis.   6. The inferior vena cava is dilated in size with <50% respiratory  variability, suggesting right atrial pressure of 15 mmHg.   Comparison(s): Compared to prior TTE in 2021, the EF has dropped from  50-55% to 30-35% with WMA as detailed above.   Patient Profile     67 y.o. male with ESRD on HD with prior BKA, PAD, DMII, HTN who was admitted with sepsis secondary to  pneumonia with course complicated by newly diagnosed systolic HF for which Cardiology was consulted.   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 Prior cath in 2021 with diffuse disease (70% mid-LAD, 100% prox Lcx, 90% distal LAD). He is currently on HD for volume management. GDMT limited due to ESRD. Planned for ischemic work-up as outpatient if EF remains depressed -Continue metop '25mg'$  XL daily -No plan for cath this admission given acute illness, lack of chest pain, and underlying significant comorbidities; can reassess EF as outpatient and consider cath if remains depressed.   #CAD Cath in 2021 with 75% mLAD, 100% prox-mid Lcz, 90% distal LAD. Was recommended for medical management at that time. Suspect trop rise secondary to demand in the setting of sepsis and newly diagnosed systolic HF. Will not plan for cath on this admission given lack of anginal symptoms and sepsis secondary to pneumonia. Plan for continued medical management and can consider ischemic work-up as outpatient if EF remains depressed on TTE despite clinical improvement. -Plan for heparin gtt x48h (ends today at 1600) -Continue ASA '81mg'$  daily -Continue lipitor '20mg'$  daily -Will plan for repeat TTE as outpatient to reassess EF  and if persistently depressed, plan for ischemic evaluation at that time  #Sepsis Secondary to Pneumonia: -Management per primary  #HTN: -Continue metop '25mg'$  XL daily  #ESRD on HD: -Management per nephrology  Cardiology will sign-off. Will arrange for outpatient follow-up. Please call if questions or concerns.      For questions or updates, please contact [place] Please consult [email or web address] for contact info under        Signed, Freada Bergeron, MD  09/25/2022, 8:24 AM

## 2022-09-25 NOTE — Progress Notes (Signed)
Placed on 6L [place], pt tolerating well at this time

## 2022-09-25 NOTE — Progress Notes (Signed)
Follow up arranged on 10/05/22 for cardiology appointment, see AVS

## 2022-09-25 NOTE — Progress Notes (Addendum)
[facility] Progress Note  Subjective: seen in room, got 3.3 L off w/ HD yesterday. Weaned off bipap and is on 6-8 L HFNC now.   Vitals:   09/24/22 2005 09/24/22 2050 09/25/22 0016 09/25/22 0526  BP: 132/80  122/78 125/66  Pulse: 99 97 93 88  Resp: '20  20 20  '$ Temp: 99.8 F (37.7 C)  99.6 F (37.6 C) 99.1 F (37.3 C)  TempSrc: Oral  Axillary Axillary  SpO2: 100% 98% 100% 100%  Weight:    71.4 kg  Height:        Exam: Gen alert, no distress, on HFNC now, off bipap No jvd or bruits Chest dec'd at bases, no wheezing RRR no RG Abd soft ntnd no mass or ascites +bs  MS L BKA, R foot w/ toe amps Ext no pitting LE edema Neuro is alert, Ox 3 , nf    LUA AVF+bruit      Home meds include - coreg 12.5 bid, percocet, MVI, norvasc 5, aspirin, lipitor, imdur 30 qd, renvela 1-2 ac tid     OP HD: TTS G-O 3h 30mn  400/700   72.5kg  2/2.5 bath  LUA AVF  Hep 2000 - last HD 2/22, post wt 72.6kg   - rocaltrol 0.50 mcg po tiw - venofer 50 mg weekly IV - mircera 120 mcg IV q 4 wks, last 2/15, due 2/29       CXR RLL opacities    CT chest - no PE, diffuse bilat LL opacities w/ some pleural effusions    Repeat CXR 2/24 - improving bilat opacities at 7 pm   Assessment/ Plan: Sepsis / bilat PNA - on IV abx per pmd Acute systolic HF / NSTEMI - +trop 5500, getting IV heparin per cardiology. LVEF 30-35% by echo here.  AHRF - was bipap yest and down to HFNC today at 6-8L ESRD - on HD TTS. Has not missed OP HD. Had HD yesterday on schedule. Needs more volume off on HFNC. Plan extra HD Monday if possible.  HTN/ volume - BP's wnl, is below dry wt w/ sig resp distress, suspected vol overload. Has lost body wt. Will cont to lower vol w/ extra HD Monday is possible.  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 Follow.  MBD ckd - CCa and phos in range. Cont po vdra.     RKelly SplinterMD [facility]A 09/25/2022, 7:30 AM  Recent Labs  Lab 09/24/22 0109 09/25/22 0040  HGB 8.7* 9.1*   ALBUMIN 2.3* 2.3*  CALCIUM 7.5* 8.1*  PHOS 5.6* 4.5  CREATININE 6.12* 4.80*  K 3.8 4.4    No results for input(s): "IRON", "TIBC", "FERRITIN" in the last 168 hours. Inpatient medications:  aspirin EC  81 mg Oral Daily   atorvastatin  20 mg Oral Daily   calcitRIOL  0.5 mcg Oral Q T,Th,Sa-HD   Chlorhexidine Gluconate Cloth  6 each Topical Daily   Chlorhexidine Gluconate Cloth  6 each Topical Q0600   metoprolol succinate  25 mg Oral Daily   sevelamer carbonate  800 mg Oral TID WC    ceFEPime (MAXIPIME) IV 1 g (09/24/22 2020)   heparin 1,150 Units/hr (09/24/22 1528)

## 2022-09-25 NOTE — Progress Notes (Signed)
FMTS Brief Progress Note  S: Denies chest pain, shortness of breath.  States he is doing well and feels it is easier to breathe than previously.  States he did not wear BiPAP all day.  O: BP 126/69 (BP Location: Right Arm)   Pulse 98   Temp 99.4 F (37.4 C) (Oral)   Resp 20   Ht '5\' 3"'$  (1.6 m)   Wt 71.4 kg   SpO2 90%   BMI 27.88 kg/m   Gen: awake, alert, NAD CV: RRR, no murmurs ascultated Pulm: Diminished breath sounds on the right lower lobe, crackles throughout, on HFNC 9 L, no increased WOB  A/P: Pneumonia, sepsis, NSTEMI Continues to improve, on HFNC 9 L currently.  Satting appropriately with goal > 92%.  Continue management per day team with antibiotics and respiratory support, heparin GTT. - Orders reviewed. Labs for AM ordered, which was adjusted as needed.   Wells Guiles, DO 09/25/2022, 9:14 PM PGY-2, [facility] Night Resident  Please page [phone] with questions.

## 2022-09-25 NOTE — Progress Notes (Signed)
ANTICOAGULATION CONSULT NOTE - Initial Consult  Pharmacy Consult for heparin  Indication: chest pain/ACS and age indeterminate DVT  Allergies  Allergen Reactions   Tramadol Nausea And Vomiting    Pt states he took this on an empty stomach one time and it came right back up. He's not had it since.     Patient Measurements: Height: '5\' 3"'$  (160 cm) Weight: 71.4 kg (157 lb 6.5 oz) IBW/kg (Calculated) : 56.9 Heparin Dosing Weight: 71.7kg   Vital Signs: Temp: 99 F (37.2 C) (02/25 1224) Temp Source: Axillary (02/25 1224) BP: 135/76 (02/25 1227) Pulse Rate: 104 (02/25 1227)  Labs: Recent Labs    09/22/22 1633 09/23/22 0126 09/23/22 1238 09/23/22 1424 09/23/22 2235 09/24/22 0109 09/24/22 0928 09/25/22 0040  HGB 9.9* 8.5*  --   --   --  8.7*  --  9.1*  HCT 30.9* 26.2*  --   --   --  26.5*  --  27.8*  PLT 223 180  --   --   --  181  --  185  LABPROT 13.5  --   --   --   --   --   --   --   INR 1.0  --   --   --   --   --   --   --   HEPARINUNFRC  --   --   --   --  0.21*  --  0.30 0.46  CREATININE 3.06* 4.51*  --   --   --  6.12*  --  4.80*  TROPONINIHS  --   --  [id number]* 6,320*  --   --   --   --      Estimated Creatinine Clearance: 13.8 mL/min (A) (by C-G formula based on SCr of 4.8 mg/dL (H)).   Medical History: Past Medical History:  Diagnosis Date   CHF (congestive heart failure) ([place])    Chronic kidney disease    Diabetes mellitus without complication ([place])    PATIENT JUST LEARNED HE WAS DIABETIC   Hepatitis    Hyperlipemia    Hypertension    Necrotizing fasciitis ([place])    Pneumonia    HX OF PNA   Assessment: Patient admitted with CC of cough and SOB. Cardiology consulted for new drop in EF from 50-55% to 30-35%.  Trop elevated to 6320 compatible with NSTEMI. Patient ESRD at baseline, no anticoagulation PTA. Cardiology recommending 48 hours of heparin. Heparin started 2/23.   Heparin to stop today after 48 hours of therapy per cardiology. Will continue  anticoagulation with apixaban per primary team for treatment of DVT.   Goal of Therapy:  Heparin level 0.3-0.7 units/ml Monitor platelets by anticoagulation protocol: Yes   Plan:  Stop heparin infusion today at 1544  Start apixaban 10 mg PO BID x 5 days; then 5 mg PO BID  Apixaban to start at 1600  F/u apixaban education and insurance coverage   Gena Fray, PharmD PGY1 Pharmacy Resident   09/25/2022 12:32 PM

## 2022-09-26 ENCOUNTER — Other Ambulatory Visit (HOSPITAL_COMMUNITY): Payer: Self-pay

## 2022-09-26 DIAGNOSIS — N186 End stage renal disease: Secondary | ICD-10-CM | POA: Diagnosis not present

## 2022-09-26 DIAGNOSIS — J189 Pneumonia, unspecified organism: Secondary | ICD-10-CM | POA: Diagnosis not present

## 2022-09-26 DIAGNOSIS — J159 Unspecified bacterial pneumonia: Secondary | ICD-10-CM | POA: Diagnosis not present

## 2022-09-26 DIAGNOSIS — I214 Non-ST elevation (NSTEMI) myocardial infarction: Secondary | ICD-10-CM | POA: Diagnosis not present

## 2022-09-26 LAB — GLUCOSE, CAPILLARY
Glucose-Capillary: 164 mg/dL — ABNORMAL HIGH (ref 70–99)
Glucose-Capillary: 167 mg/dL — ABNORMAL HIGH (ref 70–99)
Glucose-Capillary: 160 mg/dL — ABNORMAL HIGH (ref 70–99)
Glucose-Capillary: 193 mg/dL — ABNORMAL HIGH (ref 70–99)

## 2022-09-26 LAB — CBC
HCT: 26.7 % — ABNORMAL LOW (ref 39.0–52.0)
Hemoglobin: 8.6 g/dL — ABNORMAL LOW (ref 13.0–17.0)
MCH: 31.7 pg (ref 26.0–34.0)
MCHC: 32.2 g/dL (ref 30.0–36.0)
MCV: 98.5 fL (ref 80.0–100.0)
Platelets: 175 10*3/uL (ref 150–400)
RBC: 2.71 MIL/uL — ABNORMAL LOW (ref 4.22–5.81)
RDW: 15.9 % — ABNORMAL HIGH (ref 11.5–15.5)
WBC: 10.6 10*3/uL — ABNORMAL HIGH (ref 4.0–10.5)
nRBC: 0 % (ref 0.0–0.2)

## 2022-09-26 LAB — RENAL FUNCTION PANEL
Albumin: 2.1 g/dL — ABNORMAL LOW (ref 3.5–5.0)
Anion gap: 18 — ABNORMAL HIGH (ref 5–15)
BUN: 60 mg/dL — ABNORMAL HIGH (ref 8–23)
CO2: 22 mmol/L (ref 22–32)
Calcium: 7.7 mg/dL — ABNORMAL LOW (ref 8.9–10.3)
Chloride: 89 mmol/L — ABNORMAL LOW (ref 98–111)
Creatinine, Ser: 6.48 mg/dL — ABNORMAL HIGH (ref 0.61–1.24)
GFR, Estimated: 9 mL/min — ABNORMAL LOW (ref >60)
Glucose, Bld: 187 mg/dL — ABNORMAL HIGH (ref 70–99)
Phosphorus: 4.6 mg/dL (ref 2.5–4.6)
Potassium: 4.1 mmol/L (ref 3.5–5.1)
Sodium: 129 mmol/L — ABNORMAL LOW (ref 135–145)

## 2022-09-26 MED ORDER — METHYLPREDNISOLONE SODIUM SUCC 40 MG IJ SOLR
40.0000 mg | Freq: Every day | INTRAMUSCULAR | Status: AC
  Administered 2022-09-26 – 2022-09-28 (×3): 40 mg via INTRAVENOUS
  Filled 2022-09-26 (×3): qty 1

## 2022-09-26 MED ORDER — DARBEPOETIN ALFA 150 MCG/0.3ML IJ SOSY
150.0000 ug | PREFILLED_SYRINGE | INTRAMUSCULAR | Status: DC
  Administered 2022-09-29: 150 ug via SUBCUTANEOUS
  Filled 2022-09-26 (×2): qty 0.3

## 2022-09-26 MED ORDER — HEPARIN SODIUM (PORCINE) 1000 UNIT/ML IJ SOLN
INTRAMUSCULAR | Status: AC
  Administered 2022-09-26: 2000 [IU]
  Filled 2022-09-26: qty 2

## 2022-09-26 NOTE — Progress Notes (Signed)
[facility] Progress Note    Assessment/ Plan:   Sepsis / bilat PNA - on IV abx per pmd Acute systolic HF / NSTEMI - +trop 5500, s/p IV heparin per cardiology. LVEF 30-35% by echo here. Ischemic workup planned for outpatient AHRF - was bipap yest and down to HFNC today at 6-8L ESRD - on HD TTS. Extra HD planned for today for extra UF, back on TTS schedule tomorrow HTN/ volume - BP's acceptable, is below dry wt w/ sig resp distress initially, suspected vol overload. Has lost body wt. 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 Follow. 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 Cont po vdra.   OP HD: TTS G-O 3h 56mn  400/700   72.5kg  2/2.5 bath  LUA AVF  Hep 2000 - last HD 2/22, post wt 72.6kg   - rocaltrol 0.50 mcg po tiw - venofer 50 mg weekly IV - mircera 120 mcg IV q 4 wks, last 2/15, due 2/29  Subjective:   Patient seen and examined bedside. Eating breakfast. No complaints currently, he reports that his breathing is doing okay.   Objective:   BP 133/73 (BP Location: Right Arm)   Pulse 95   Temp 99 F (37.2 C) (Oral)   Resp 20   Ht '5\' 3"'$  (1.6 m)   Wt 72 kg   SpO2 98%   BMI 28.12 kg/m   Intake/Output Summary (Last 24 hours) at 09/26/2022 0900 Last data filed at 09/26/2022 [id number] per 24 hour  Intake 1320 ml  Output --  Net 1320 ml   Weight change: -3.8 kg  Physical Exam: Gen: NAD CVS: RRR Resp: diminished air entry bibasilar Abd: soft Ext: left bka, no edema Neuro: awake, alert Dialysis access: LUE AVF +b/t  Imaging: DG CHEST PORT 1 VIEW  Result Date: 09/24/2022 CLINICAL DATA:  Acute respiratory failure with hypoxia. EXAM: PORTABLE CHEST 1 VIEW COMPARISON:  Radiograph earlier today. CT yesterday. FINDINGS: Improving bilateral airspace disease. Persistent bilateral pleural effusions as well as basilar and perihilar opacities. Stable heart size and mediastinal contours. Left axillary stent. No  pneumothorax. IMPRESSION: 1. Improving bilateral airspace disease, may represent improving pulmonary edema or infection. 2. Persistent bilateral pleural effusions and basilar and perihilar opacities. Electronically Signed   By: MKeith RakeM.D.   On: 09/24/2022 20:39    Labs: BMET Recent Labs  Lab 09/22/22 1633 09/23/22 0126 09/23/22 1238 09/24/22 0109 09/25/22 0040 09/26/22 0045  NA 135 137  --  129* 133* 129*  K 3.5 3.9  --  3.8 4.4 4.1  CL 90* 93*  --  88* 92* 89*  CO2 25 20*  --  20* 21* 22  GLUCOSE 125* 173*  --  135* 132* 187*  BUN 16 28*  --  53* 34* 60*  CREATININE 3.06* 4.51*  --  6.12* 4.80* 6.48*  CALCIUM 8.5* 7.9*  --  7.5* 8.1* 7.7*  PHOS  --   --  5.0* 5.6* 4.5 4.6   CBC Recent Labs  Lab 09/22/22 1633 09/23/22 0126 09/24/22 0109 09/25/22 0040 09/26/22 0045  WBC 17.2* 13.3* 10.2 12.2* 10.6*  NEUTROABS 15.3* 12.0*  --   --   --   HGB 9.9* 8.5* 8.7* 9.1* 8.6*  HCT 30.9* 26.2* 26.5* 27.8* 26.7*  MCV 100.3* 100.8* 97.8 98.6 98.5  PLT 223 180 181 185 175    Medications:     apixaban  10 mg Oral BID   Followed by   Derrill Memo ON 09/30/2022] apixaban  5 mg Oral BID   aspirin EC  81 mg Oral Daily   atorvastatin  20 mg Oral Daily   calcitRIOL  0.5 mcg Oral Q T,Th,Sa-HD   Chlorhexidine Gluconate Cloth  6 each Topical Daily   Chlorhexidine Gluconate Cloth  6 each Topical Q0600   Chlorhexidine Gluconate Cloth  6 each Topical Q0600   metoprolol succinate  25 mg Oral Daily   sevelamer carbonate  800 mg Oral TID WC      Gean Quint, MD [facility] 09/26/2022, 9:00 AM

## 2022-09-26 NOTE — Care Management Important Message (Signed)
Important Message  Patient Details  Name: Dontreal Taute MRN: [id number] Date of Birth: 01-Aug-1956   Medicare Important Message Given:  Yes     Shelda Altes 09/26/2022, 8:48 AM

## 2022-09-26 NOTE — Progress Notes (Signed)
Daily Progress Note Intern Pager: [phone]  Patient name: [name] Medical record number: [id number] Date of birth: 1956/01/30 Age: 67 y.o. Gender: male  Primary Care Provider: Patient, No Pcp Per Consultants: nephrology, cardiology Code Status: Full Code  Pt Overview and Major Events to Date:  2/22 - admitted   Assessment and Plan: [name] is a 67 y.o. male presenting with cough and SOB.  Past medical history includes ESRD on HD T/TH/Sat, heart failure, well-controlled T2DM, PAD, unilateral BKA, hyperparathyroidism, hypertension.   Pneumonia On admission patient meeting 3/4 SIRS criteria. WBC continues to downtrend, now 10.6 from 17.2 initially. MRSA swab negative. Vancomycin d/c and cefepime continued. R basilar pneumonia on CXR. Currently on BiPAP. CTA PE negative for PE, did demonstrate multifocal PNA w/ bilat pleural effusions. On 11 L HFNC this morning-mild increased work of breathing, tachypneic satting 98%. Still borderline tachycardic. - RT eval and treat - BiPAP prn - Monitor resp status - SPO2 > 92% - Continue cefepime -10/02/22 (abx day 5 of 10 days) - start solumedrol 40 mg daily for 3 days - Incentive spirometry - f/u blood cultures x2  ESRD on dialysis ([place]) ESRD on HD T/Th/Sat. Na 129. Getting extra dialysis today (2/26) - nephrology following, appreciate recs  Heart failure, unspecified ([place]) On admission BNP 3189. Echo (2/23) showed severe hypokinesis in LV, concerning for LAD disease vs takostubo, EF 30-35% (previously 50-55% on echo in 2021), LV RWMA, G2DD. Appears euvolemic. Weight stable at 72 kg. Cards recommending volume management w/ HD.  - AM CMP - Strict I's and O's - daily weights  DVT (deep venous thrombosis) (HCC) Patient noted to have age indeterminate DVT in right popliteal vein on [place] yesterday, following recommendation from CTPE study. Patient on heparin gtt for NSTEMI, for 48 hours. Will consider DOAC once transitioned off of heparin.   - continue apixaban 10 mg PO BID x 5 days; then 5 mg PO BID  -Consider DOAC in future  NSTEMI (non-ST elevated myocardial infarction) (HCC) S/p heparin gtt for 48 hrs (2/23-2/25). Also wanting to start GDMT - not candidate for heart cath. Patient not candidate for Entresto/ACE. -Cardiology following, appreciate recs - continue apixaban 10 mg PO BID x 5 days; then 5 mg PO BID  -metoprolol 25 mg  Type 2 diabetes mellitus, controlled (HCC) A1c of 5.6 this admission. Glucose in 130-140s yesterday. - CBG 4 times daily before meals and bedtime  Other chronic conditions: HLD - atorvastatin 20 mg  FEN/GI: Renal Diet, fluid restriction 1200 ml PPx: apixaban 10 mg PO BID x 5 days; then 5 mg PO BID  Dispo: pending medical improvement  Subjective:  Patient denies shortness of breath. He says he was taken off BiPAP so he could eat, but would like to go back on it since "it helps." He says he does not wear the BiPAP while on dialysis. Denies any other somatic complaints.  Objective: Temp:  [98.6 F (37 C)-99.4 F (37.4 C)] 99 F (37.2 C) (02/26 0434) Pulse Rate:  [95-104] 95 (02/26 0710) Resp:  [20] 20 (02/26 0710) BP: (126-140)/(69-76) 133/73 (02/26 0710) SpO2:  [87 %-98 %] 98 % (02/26 0710) Weight:  [72 kg] 72 kg (02/26 0434) Physical Exam: General: in no acute distress HEENT: normocephalic and atraumatic Respiratory: on RA, on 11 HFNC, and tachypneic. Diminished breath sounds throughout with occasional rhonchi Extremities: moving all extremities spontaneously Gastrointestinal: non-tender and non-distended Cardiovascular: tachycardic bordering on normal rate  Laboratory: Most recent CBC Lab Results  Component Value  Date   WBC 10.6 (H) 09/26/2022   HGB 8.6 (L) 09/26/2022   HCT 26.7 (L) 09/26/2022   MCV 98.5 09/26/2022   PLT 175 09/26/2022   Most recent BMP    Latest Ref Rng & Units 09/26/2022   12:45 AM  BMP  Glucose 70 - 99 mg/dL 187   BUN 8 - 23 mg/dL 60   Creatinine  0.61 - 1.24 mg/dL 6.48   Sodium 135 - 145 mmol/L 129   Potassium 3.5 - 5.1 mmol/L 4.1   Chloride 98 - 111 mmol/L 89   CO2 22 - 32 mmol/L 22   Calcium 8.9 - 10.3 mg/dL 7.7     Camelia Phenes, MD 09/26/2022, 10:39 AM  PGY-1, [place] Intern pager: [phone], text pages welcome Secure chat group [place]

## 2022-09-26 NOTE — TOC Benefit Eligibility Note (Signed)
Patient Teacher, English as a foreign language completed.    The patient is currently admitted and upon discharge could be taking Eliquis 5 mg.  The current 30 day co-pay is $0.00.   The patient is insured through [place], [address] Patient Advocate Specialist [place] Patient Advocate Team Direct Number: [phone]  Fax: [phone]

## 2022-09-26 NOTE — Progress Notes (Signed)
   09/26/22 1830  Vitals  Temp 98.2 F (36.8 C)  BP 117/64  MAP (mmHg) 80  Pulse Rate 96  ECG Heart Rate 95  Resp (!) 40  Level of Consciousness  Level of Consciousness Alert  MEWS COLOR  MEWS Score Color Yellow  Oxygen Therapy  SpO2 100 %  O2 Device Bi-PAP  Pain Assessment  Pain Scale 0-10  Pain Score 0  PCA/Epidural/Spinal Assessment  Respiratory Pattern Regular;Unlabored  Height and Weight  Weight 71.7 kg  Type of Weight Post-Dialysis  BMI (Calculated) 28.01  ECG Monitoring  Cardiac Rhythm NSR  MEWS Score  MEWS Temp 0  MEWS Systolic 0  MEWS Pulse 0  MEWS RR 3  MEWS LOC 0  MEWS Score 3   Received patient in bed to unit.  Alert and oriented.  Informed consent signed and in chart.   TX duration:  Patient tolerated well.  Transported back to the room  Alert, without acute distress.  Hand-off given to patient's nurse.   Access used: Yes Access issues: No  Total UF removed: 2800 Medication(s) given: No Post HD VS: See Above Post HD weight: 71.7kg   [facility]

## 2022-09-26 NOTE — Progress Notes (Deleted)
Cardiology Office Note:    Date:  09/26/2022   ID:  [name], DOB 06/24/1956, MRN [id number]  PCP:  Patient, No Pcp Per  Cardiologist:  Elouise Munroe, MD  Electrophysiologist:  None   Referring MD: No ref. provider found   Chief Complaint: hospital follow-up of NSTEMI and atrial fibrillation  History of Present Illness:    [name] is a 67 y.o. 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 Margaretann Loveless and presents today for hospital follow-up of NSTEMI and atrial fibrillation.   Patient admitted in 11/2019 with purulent cellulitis of right lower extremity. He was also noted to be grossly volume overloaded and Echo showed LVEF of 30-35% with grade 2 diastolic dysfunction. Also found to have CKD stage V and patient was started on hemodialysis. Also required blood transfusion due to hemoglobin of 7.1 due to anemia from of CKD. After dialysis was established, he underwent left heart catheterization which showed moderate multivessel CAD including 50% stenosis of mid to distal left main. Cardiomyopathy seemed to be out of proportion to CAD but felt to possibly be a component of LV dysfunction. LVEDP was elevated. Medical therapy was recommended. He was discharged on Coreg '25mg'$  twice daily, Imdur '15mg'$  daily, and Lipitor '20mg'$  daily. Repeat Echo in 04/2020 showed LVEF of 50-55% with normal wall motion and grade 2 diastolic dysfunction but there was still evidence of high left heart filling pressures (although improved from 10/2019).   Patient was last seen by me in 04/2020 at which time he appeared very  disheveled. He described very brief episodes of atypical chest pain but was otherwise, soing well. He was started on Hydralazine for additional BP control.  He was not seen again until recent hospitalization. He was admitted from 09/22/2022 to *** for sepsis secondary to hospital acquired pneumonia after presenting with hypoxia and worsening cough. He ultimately required BiPAP. Chest CTA was negative for PE but showed findings consistent with multifocal pneumonia associated with bilateral pleural effusions. BNP was elevated at 3,189. High-sensitivity troponin was checked in light of patient's known CAD and was elevated at 5,494 >> 6,320. Echo showed drop in EF to 30-35% with severe hypokinesis of the mid to apical septa, mid to apical anterior, and mid to apical inferior LV segments and apex. Findings were concerning for possible LAD disease vs Takostubo cardiomyopathy. 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 He was not felt to be a good cardiac catheterization candidate. He was treated with IV Heparin for 48 hours and was started on Toprol-XL.Volume status was managed via hemodialysis. Plan was for repeat Echo as an outpatient with possible cardiac catheterization if EF remained depressed. His hospitalization was complicated by atrial fibrillation with RVR with associated hypotension. *** Pneumonia was treated with antibiotics and steroids. He was weaned off BiPaP and was discharged on *** L of O2. Of note, lower extremity dopplers during admission showed age indeterminate DVT in the right popliteal vein and he was started on Eliquis as well.   Patient presents today for follow-up. ***  Chronic Combined CHF Initially diagnosed in 11/2019 when found to  have an EF of 30-35%. Catht at that time showed moderate CAD but degree of cardiomyopathy was felt to be out of proportion to CAD. He was started on GDMT and EF improved to 50-55% in 04/2020. During recent admission in  09/2022 for hospital acquired pneumonia and sepsis, EF was found to be back done. Echo showed 30-35% with severe hypokinesis of the mid to apical septa, mid to apical anterior, and mid to apical inferior LV segments and apex. Findings were concerning for possible LAD disease vs Takostubo cardiomyopathy. 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  - Appears euvolemic on exam. - Volume status managed via dialysis.  - GDMT limited by ESRD. Continue Coreg '25mg'$  twice daily and Losartan '50mg'$  daily. ***  CAD Recent NSTEMI  LHC in 11/2019 showed moderate CAD including 50% stenosis of mid to distal left main and occluded CX. Medical therapy recommended. During recent admission in 09/2022 for hospital acquired pneumonia and sepsis, he was found to have a markedly elevated troponin peaking in the 6,000s. Echo showed reduced EF and wall motion abnormalities consistent with Takotubo cardiomyopathy vs less likely LAD disease. He was treated medically with IV Heparin for 48 hours and plan was for repeat Echo as an outpatient after recovering from pneumonia with consideration for cardiac catheterization if EF still depressed.  - No chest pain.  - Continue Aspirin '81mg'$  daily. *** - Continue Lipitor '20mg'$  daily. ***  Paroxysmal Atrial Fibrillation Patient was noted to have new onset atrial fibrillation during recent admission. *** -  - Continue Eliquis ***   PVD S/p left BKA and multiple right toe amputations in 2015 due to gangrene and necrotizing fasciitis. Last ABI in 2021 showed non-compressible right lower extremity arteries.  - Continue aspirin and statin.   Hypertension BP *** - Continue current medications: ***   Type 2 Diabetes Mellitus  Hemoglobin A1c 5.6 in 09/2022 - Management per PCP  ESRD On hemodialysis T/Th/S. - Management per Nephrology.  Past Medical History:  Diagnosis Date   CHF (congestive heart failure) (HCC)    Chronic kidney disease    Diabetes mellitus without  complication ([place])    PATIENT JUST LEARNED HE WAS DIABETIC   Hepatitis    Hyperlipemia    Hypertension    Necrotizing fasciitis ([place])    Pneumonia    HX OF PNA    Past Surgical History:  Procedure Laterality Date   AMPUTATION Left 09/11/2013   Procedure: AMPUTATION BELOW KNEE;  Surgeon: Marianna Payment, MD;  Location: [place];  Service: Orthopedics;  Laterality: Left;   AMPUTATION Right 12/18/2013   Procedure: RIGHT FOOT 1,2, TOE AMPUTATION  5th toe RAY AMPUTATION;  Surgeon: Marianna Payment, MD;  Location: [place];  Service: Orthopedics;  Laterality: Right;   AV FISTULA PLACEMENT Left 12/11/2019   Procedure: LEFT BRACHIOCEPHALIC FISTULA CREATION;  Surgeon: Rosetta Posner, MD;  Location: [place];  Service: Vascular;  Laterality: Left;   CHOLECYSTECTOMY     I & D EXTREMITY Left 09/03/2013   Procedure: IRRIGATION AND DEBRIDEMENT EXTREMITY;  Surgeon: Marianna Payment, MD;  Location: [place];  Service: Orthopedics;  Laterality: Left;   I & D EXTREMITY Left 09/11/2013   Procedure: LEFT FOOT IRRIGATION AND DEBRIDEMENT;  Surgeon: Marianna Payment, MD;  Location: [place];  Service: Orthopedics;  Laterality: Left;   INSERTION OF DIALYSIS CATHETER  12/11/2019   Procedure: Insertion Of Dialysis Catheter;  Surgeon: Rosetta Posner, MD;  Location: [place];  Service: Vascular;;  INSERTION OF DIALYSIS CATHETER Right 07/11/2022   Procedure: INSERTION OF TUNNELED DIALYSIS CATHETER;  Surgeon: Angelia Mould, MD;  Location: [facility];  Service: Vascular;  Laterality: Right;   LEFT HEART CATH AND CORONARY ANGIOGRAPHY N/A 12/13/2019   Procedure: LEFT HEART CATH AND CORONARY ANGIOGRAPHY;  Surgeon: Jettie Booze, MD;  Location: [place] [facility];  Service: Cardiovascular;  Laterality: N/A;   REVISON OF ARTERIOVENOUS FISTULA Left 07/11/2022   Procedure: REVISON OF LEFT ARM ARTERIOVENOUS FISTULA;  Surgeon: Angelia Mould, MD;  Location: [place];  Service: Vascular;  Laterality: Left;   RIGHT HEART CATH  N/A 12/13/2019   Procedure: RIGHT HEART CATH;  Surgeon: Jettie Booze, MD;  Location: [place] [facility];  Service: Cardiovascular;  Laterality: N/A;    Current Medications: No outpatient medications have been marked as taking for the 10/05/22 encounter (Appointment) with Darreld Mclean, PA-C.     Allergies:   Tramadol   Social History   Socioeconomic History   Marital status: Single    Spouse name: Not on file   Number of children: Not on file   Years of education: Not on file   Highest education level: Not on file  Occupational History   Not on file  Tobacco Use   Smoking status: Never   Smokeless tobacco: Never  Vaping Use   Vaping Use: Never used  Substance and Sexual Activity   Alcohol use: No   Drug use: No   Sexual activity: Not on file  Other Topics Concern   Not on file  Social History Narrative   ** Merged History Encounter **       Social Determinants of Health   Financial Resource Strain: Not on file  Food Insecurity: No Food Insecurity (09/23/2022)   Hunger Vital Sign    Worried About Running Out of Food in the Last Year: Never true    Ran Out of Food in the Last Year: Never true  Transportation Needs: No Transportation Needs (09/23/2022)   PRAPARE - Hydrologist (Medical): No    Lack of Transportation (Non-Medical): No  Physical Activity: Not on file  Stress: Not on file  Social Connections: Not on file     Family History: The patient's family history includes Dementia in his mother; Diabetes type II in his mother; Heart disease in his father.  ROS:   Please see the history of present illness.     EKGs/Labs/Other Studies Reviewed:    The following studies were reviewed today:  Left Heart Catheterization 12/13/2019: RPDA lesion is 50% stenosed. Mid LM to Dist LM lesion is 50% stenosed. Prox Cx to Mid Cx lesion is 100% stenosed. Mid LAD lesion is 75% stenosed. Dist LAD-1 lesion is 50% stenosed. Dist LAD-2  lesion is 90% stenosed. LV end diastolic pressure is moderately elevated. There is no aortic valve stenosis. Diffuse CAD. He denied angina. He has had DOE. Hemodynamic findings consistent with moderate pulmonary hypertension. Ao sat 95%, PA sat 62%, mean PA pressure 36 mm Hg; mean PCWP 23 mm Hg; CO 7.0 L/min; CI 4   Moderate CAD.  Cardiomyopathy seems out of proportion to CAD, but ischemia may be a component of the LV dysfunction.     Elevated LVEDP.  Patient lying on a wedge due to shortness of breath.  Continue medical therapy for LV dysfunction.     He did not want any family notified.    RPDA lesion is 50% stenosed. Mid  LM to Dist LM lesion is 50% stenosed. Prox Cx to Mid Cx lesion is 100% stenosed. Mid LAD lesion is 75% stenosed. Dist LAD-1 lesion is 50% stenosed. Dist LAD-2 lesion is 90% stenosed. LV end diastolic pressure is moderately elevated. There is no aortic valve stenosis. Diffuse CAD. He denied angina. He has had DOE. Hemodynamic findings consistent with moderate pulmonary hypertension. Ao sat 95%, PA sat 62%, mean PA pressure 36 mm Hg; mean PCWP 23 mm Hg; CO 7.0 L/min; CI 4   Moderate CAD.  Cardiomyopathy seems out of proportion to CAD, but ischemia may be a component of the LV dysfunction.     Elevated LVEDP.  Patient lying on a wedge due to shortness of breath.  Continue medical therapy for LV dysfunction.     He did not want any family notified.    Diagnostic Dominance: Right  _______________   Echocardiogram 04/10/2020: Impressions:  1. Left ventricular ejection fraction, by estimation, is 50 to 55%. The  left ventricle has low normal function. The left ventricle has no regional  wall motion abnormalities. Left ventricular diastolic parameters are  consistent with Grade II diastolic  dysfunction (pseudonormalization). Elevated left atrial pressure.   2. Right ventricular systolic function is normal. The right ventricular  size is normal.   3. The mitral  valve is normal in structure. Trivial mitral valve  regurgitation. No evidence of mitral stenosis.   4. The aortic valve is normal in structure. Aortic valve regurgitation is  not visualized. No aortic stenosis is present.   5. The inferior vena cava is normal in size with greater than 50%  respiratory variability, suggesting right atrial pressure of 3 mmHg.   Comparison(s): Prior images reviewed side by side. The left ventricular function has improved. The left ventricular diastolic function has improved. Although there is still evidence of high left heart filling pressures, the E/A and E/e' ratios are lower compared to April 2021.  _______________  Echocardiogram 09/23/2022: Impressions: 1. There is severe hypokinesis of the mid-to-apical septal, mid-to-apical  anterior, and mid-to-apical inferior LV segments and apex. No LV thrombus  visualized. Findings concerning for possible LAD disease vs takostubo  cardiomyopathy. Left ventricular  ejection fraction, by estimation, is 30 to 35%. The left ventricle has  moderately decreased function. The left ventricle demonstrates regional  wall motion abnormalities (see scoring diagram/findings for description).  Left ventricular diastolic parameters   are consistent with Grade II diastolic dysfunction (pseudonormalization).  The E/e' is 17.2.   2. Right ventricular systolic function is mildly reduced. The right  ventricular size is normal.   3. Left atrial size was mildly dilated.   4. The mitral valve is grossly normal. Mild mitral valve regurgitation.   5. The aortic valve is tricuspid. Aortic valve regurgitation is not  visualized. Aortic valve sclerosis is present, with no evidence of aortic  valve stenosis.   6. The inferior vena cava is dilated in size with <50% respiratory  variability, suggesting right atrial pressure of 15 mmHg.   Comparison(s): Compared to prior TTE in 2021, the EF has dropped from  50-55% to 30-35% with WMA as  detailed above.  _______________  Lower Extremity Venous Dopplers 09/23/2022: Summary:  BILATERAL:  -No evidence of popliteal cyst, bilaterally.  RIGHT:  - Findings consistent with age indeterminate deep vein thrombosis  involving the right popliteal vein.    LEFT:  - There is no evidence of deep vein thrombosis in the lower extremity.   EKG:  EKG ordered today.  EKG personally reviewed and demonstrates ***.  Recent Labs: 09/22/2022: B Natriuretic Peptide 3,189.2 09/23/2022: ALT 16; Magnesium 2.1 09/26/2022: BUN 60; Creatinine, Ser 6.48; Hemoglobin 8.6; Platelets 175; Potassium 4.1; Sodium 129  Recent Lipid Panel No results found for: "CHOL", "TRIG", "HDL", "CHOLHDL", "VLDL", "LDLCALC", "LDLDIRECT"  Physical Exam:    Vital Signs: There were no vitals taken for this visit.    Wt Readings from Last 3 Encounters:  09/26/22 158 lb 11.7 oz (72 kg)  07/08/22 160 lb (72.6 kg)  03/23/22 160 lb (72.6 kg)     General: 67 y.o. male in no acute distress. HEENT: Normocephalic and atraumatic. Sclera clear. EOMs intact. Neck: Supple. No carotid bruits. No JVD. Heart: *** RRR. Distinct S1 and S2. No murmurs, gallops, or rubs. Radial and distal pedal pulses 2+ and equal bilaterally. Lungs: No increased work of breathing. Clear to ausculation bilaterally. No wheezes, rhonchi, or rales.  Abdomen: Soft, non-distended, and non-tender to palpation. Bowel sounds present in all 4 quadrants.  MSK: Normal strength and tone for age. *** Extremities: No lower extremity edema.    Skin: Warm and dry. Neuro: Alert and oriented x3. No focal deficits. Psych: Normal affect. Responds appropriately.   Assessment:    No diagnosis found.  Plan:     Disposition: Follow up in ***   Medication Adjustments/Labs and Tests Ordered: Current medicines are reviewed at length with the patient today.  Concerns regarding medicines are outlined above.  No orders of the defined types were placed in this  encounter.  No orders of the defined types were placed in this encounter.   There are no Patient Instructions on file for this visit.   Signed, Darreld Mclean, PA-C  09/26/2022 1:32 PM    [facility]

## 2022-09-26 NOTE — Progress Notes (Signed)
Patient placed back on BIPAP. MD made aware

## 2022-09-26 NOTE — Progress Notes (Signed)
Heart Failure Navigator Progress Note  Assessed for Heart & Vascular TOC clinic readiness.  Patient does not meet criteria due to ESRD on hemodialysis.   Navigator will sign off at this time.    Kuron Docken, BSN, RN Heart Failure Nurse Navigator Secure Chat Only   

## 2022-09-26 NOTE — Progress Notes (Addendum)
FMTS Brief Progress Note  S: Patient notes the BiPAP helps him greatly.  He would like to take it off currently so that he can eat dinner though.  Denies difficulty breathing as always.  O: BP 125/78 (BP Location: Right Arm)   Pulse 86   Temp 98.1 F (36.7 C) (Oral)   Resp 16   Ht '5\' 3"'$  (1.6 m)   Wt 71.7 kg   SpO2 100%   BMI 28.00 kg/m   General: Awake, alert, conversationally appropriate CV: RRR, no murmurs auscultated Pulm: Tachypneic, diminished breath sounds in right lower lobe, coarse breath sounds throughout, BiPAP in place exchanged for HFNC during meal  A/P: Pneumonia Unfortunately has not improved the way would be expected.  Started on Solu-Medrol by day team and reinforced BiPAP use.  Should he worsen overnight, will consider chest x-ray and potentially chest CT afterwards.  Continues to appear euvolemic.  Otherwise, continue plan per day team. - Orders reviewed. Labs for AM ordered, which was adjusted as needed.   Wells Guiles, DO 09/26/2022, 9:34 PM PGY-2, [facility] Night Resident  Please page [phone] with questions.

## 2022-09-26 NOTE — Progress Notes (Addendum)
PHARMACY - PHYSICIAN COMMUNICATION CRITICAL VALUE ALERT - BLOOD CULTURE IDENTIFICATION (BCID)  [name] is an 67 y.o. male who presented to [facility] on 09/22/2022 with a chief complaint of PNA/sepsis  Assessment:   1/2 blood cultures from 2/22 growing gram positive rods--likely contaminant  Name of physician (or Provider) Contacted: Dr. Madison Hickman  Current antibiotics:  Cefepime  Changes to prescribed antibiotics recommended:  No changes needed  No results found for this or any previous visit.  Caryl Pina 09/26/2022  3:36 AM

## 2022-09-27 ENCOUNTER — Inpatient Hospital Stay (HOSPITAL_COMMUNITY): Payer: Medicare Other

## 2022-09-27 DIAGNOSIS — I214 Non-ST elevation (NSTEMI) myocardial infarction: Secondary | ICD-10-CM | POA: Diagnosis not present

## 2022-09-27 DIAGNOSIS — J9601 Acute respiratory failure with hypoxia: Secondary | ICD-10-CM | POA: Diagnosis not present

## 2022-09-27 DIAGNOSIS — J159 Unspecified bacterial pneumonia: Secondary | ICD-10-CM | POA: Diagnosis not present

## 2022-09-27 DIAGNOSIS — I824Z9 Acute embolism and thrombosis of unspecified deep veins of unspecified distal lower extremity: Secondary | ICD-10-CM | POA: Diagnosis not present

## 2022-09-27 DIAGNOSIS — N186 End stage renal disease: Secondary | ICD-10-CM | POA: Diagnosis not present

## 2022-09-27 LAB — CBC
HCT: 27.9 % — ABNORMAL LOW (ref 39.0–52.0)
Hemoglobin: 8.8 g/dL — ABNORMAL LOW (ref 13.0–17.0)
MCH: 31.1 pg (ref 26.0–34.0)
MCHC: 31.5 g/dL (ref 30.0–36.0)
MCV: 98.6 fL (ref 80.0–100.0)
Platelets: 171 10*3/uL (ref 150–400)
RBC: 2.83 MIL/uL — ABNORMAL LOW (ref 4.22–5.81)
RDW: 15.6 % — ABNORMAL HIGH (ref 11.5–15.5)
WBC: 9.7 10*3/uL (ref 4.0–10.5)
nRBC: 0 % (ref 0.0–0.2)

## 2022-09-27 LAB — GLUCOSE, CAPILLARY
Glucose-Capillary: 302 mg/dL — ABNORMAL HIGH (ref 70–99)
Glucose-Capillary: 177 mg/dL — ABNORMAL HIGH (ref 70–99)
Glucose-Capillary: 232 mg/dL — ABNORMAL HIGH (ref 70–99)
Glucose-Capillary: 412 mg/dL — ABNORMAL HIGH (ref 70–99)

## 2022-09-27 LAB — RENAL FUNCTION PANEL
Albumin: 2.2 g/dL — ABNORMAL LOW (ref 3.5–5.0)
Anion gap: 21 — ABNORMAL HIGH (ref 5–15)
BUN: 51 mg/dL — ABNORMAL HIGH (ref 8–23)
CO2: 21 mmol/L — ABNORMAL LOW (ref 22–32)
Calcium: 8.4 mg/dL — ABNORMAL LOW (ref 8.9–10.3)
Chloride: 90 mmol/L — ABNORMAL LOW (ref 98–111)
Creatinine, Ser: 5.4 mg/dL — ABNORMAL HIGH (ref 0.61–1.24)
GFR, Estimated: 11 mL/min — ABNORMAL LOW (ref >60)
Glucose, Bld: 246 mg/dL — ABNORMAL HIGH (ref 70–99)
Phosphorus: 4.6 mg/dL (ref 2.5–4.6)
Potassium: 4.3 mmol/L (ref 3.5–5.1)
Sodium: 132 mmol/L — ABNORMAL LOW (ref 135–145)

## 2022-09-27 LAB — CULTURE, BLOOD (ROUTINE X 2)
Culture: NO GROWTH
Special Requests: ADEQUATE

## 2022-09-27 LAB — HEPATITIS B SURFACE ANTIBODY, QUANTITATIVE: Hep B S AB Quant (Post): 11 m[IU]/mL (ref >9.9)

## 2022-09-27 LAB — BRAIN NATRIURETIC PEPTIDE: B Natriuretic Peptide: 3505.9 pg/mL — ABNORMAL HIGH (ref 0.0–100.0)

## 2022-09-27 MED ORDER — HEPARIN SODIUM (PORCINE) 1000 UNIT/ML DIALYSIS
2000.0000 [IU] | Freq: Once | INTRAMUSCULAR | Status: AC
  Administered 2022-09-27: 2000 [IU] via INTRAVENOUS_CENTRAL
  Filled 2022-09-27: qty 2

## 2022-09-27 MED ORDER — INSULIN ASPART 100 UNIT/ML IJ SOLN
0.0000 [IU] | Freq: Three times a day (TID) | INTRAMUSCULAR | Status: DC
  Administered 2022-09-27: 1 [IU] via SUBCUTANEOUS
  Administered 2022-09-27: 2 [IU] via SUBCUTANEOUS
  Administered 2022-09-28: 3 [IU] via SUBCUTANEOUS
  Administered 2022-09-28: 4 [IU] via SUBCUTANEOUS
  Administered 2022-09-28 – 2022-09-29 (×4): 2 [IU] via SUBCUTANEOUS
  Administered 2022-09-30 – 2022-10-02 (×8): 1 [IU] via SUBCUTANEOUS
  Administered 2022-10-03: 2 [IU] via SUBCUTANEOUS
  Administered 2022-10-03: 1 [IU] via SUBCUTANEOUS
  Administered 2022-10-04: 2 [IU] via SUBCUTANEOUS
  Administered 2022-10-04: 3 [IU] via SUBCUTANEOUS
  Administered 2022-10-05 (×3): 1 [IU] via SUBCUTANEOUS

## 2022-09-27 MED ORDER — INSULIN ASPART 100 UNIT/ML IJ SOLN
6.0000 [IU] | Freq: Once | INTRAMUSCULAR | Status: AC
  Administered 2022-09-27: 6 [IU] via SUBCUTANEOUS

## 2022-09-27 MED ORDER — LOSARTAN POTASSIUM 50 MG PO TABS
50.0000 mg | ORAL_TABLET | Freq: Every day | ORAL | Status: DC
  Administered 2022-09-28: 50 mg via ORAL
  Filled 2022-09-27 (×2): qty 1

## 2022-09-27 NOTE — Progress Notes (Signed)
Daily Progress Note Intern Pager: [phone]  Patient name: [name] Medical record number: [id number] Date of birth: [birth year] Age: 67 y.o. Gender: male  Primary Care Provider: Patient, No Pcp Per Consultants: nephrology, cardiology, pulm Code Status: Full Code  Pt Overview and Major Events to Date:  2/22 - admitted    Assessment and Plan: [name] is a 67 y.o. male presenting with cough and SOB.  Past medical history includes ESRD on HD T/TH/Sat, heart failure, well-controlled T2DM, PAD, unilateral BKA, hyperparathyroidism, hypertension.  Pneumonia On admission patient meeting 3/4 SIRS criteria. WBC 10.6 > 9.7. MRSA swab negative. R basilar pneumonia on CXR. Currently on BiPAP. CTA PE negative for PE, did demonstrate multifocal PNA w/ bilat pleural effusions. Was on BiPAP this morning-unchanged increase in work of breathing, tachypneic. Bcx NGTD x4 - pulm consulted, appreciate recs - RT eval and treat - BiPAP prn - Monitor resp status - SPO2 > 92% - Continue cefepime -10/02/22 - continue solumedrol 40 mg daily for 3 days - Incentive spirometry  ESRD on dialysis ([place]) ESRD on HD T/Th/Sat. Na 132. - nephrology following, appreciate recs  Heart failure, unspecified ([place]) On admission BNP 3189. Echo (2/23) showed severe hypokinesis in LV, concerning for LAD disease vs takostubo, EF 30-35% (previously 50-55% on echo in 2021), LV RWMA, G2DD. Appears euvolemic. Weight stable at 72.1 kg. Cards recommending volume management w/ HD.  - AM CMP - Strict I's and O's - daily weights  DVT (deep venous thrombosis) (HCC) Patient noted to have age indeterminate DVT in right popliteal vein on [place] yesterday, following recommendation from CTPE study. S/p heparin gtt for NSTEMI, for 48 hours. - continue apixaban 10 mg PO BID x 5 days; then 5 mg PO BID   NSTEMI (non-ST elevated myocardial infarction) ([place]) S/p heparin gtt for 48 hrs (2/23-2/25). Also wanting to start GDMT - not candidate  for heart cath. Patient not candidate for Entresto/ACE. -Cardiology following, appreciate recs - continue apixaban 10 mg PO BID x 5 days; then 5 mg PO BID  -metoprolol 25 mg  Type 2 diabetes mellitus, controlled (HCC) A1c of 5.6 this admission. Glucose 302 today. - CBG 4 times daily before meals and bedtime - on vsSSI  Other chronic conditions: HLD - atorvastatin 20 mg   FEN/GI: Renal Diet, fluid restriction 1200 ml PPx: apixaban 10 mg PO BID x 5 days; then 5 mg PO BID  Dispo: pending medical improvement  Subjective:  Patient continues to deny shortness of breath. Says he feels fine. Denies any somatic complaints.  Objective: Temp:  [97.9 F (36.6 C)-98.5 F (36.9 C)] 97.9 F (36.6 C) (02/27 1228) Pulse Rate:  [79-113] 96 (02/27 1228) Resp:  [12-46] 44 (02/27 1228) BP: (78-153)/(52-91) 97/62 (02/27 1228) SpO2:  [96 %-100 %] 100 % (02/27 1228) FiO2 (%):  [50 %-60 %] 50 % (02/27 0858) Weight:  [156 lb 8.4 oz (71 kg)-161 lb 9.6 oz (73.3 kg)] 156 lb 8.4 oz (71 kg) (02/27 0856) Physical Exam: General: in no acute distress HEENT: normocephalic and atraumatic Respiratory:  mechanical quality to breath sounds throughout. Tachypneic, shallow breaths. On BiPAP Extremities: moving all extremities spontaneously Gastrointestinal: non-tender and non-distended Cardiovascular: regular rate  Laboratory: Most recent CBC Lab Results  Component Value Date   WBC 9.7 09/27/2022   HGB 8.8 (L) 09/27/2022   HCT 27.9 (L) 09/27/2022   MCV 98.6 09/27/2022   PLT 171 09/27/2022   Most recent BMP    Latest Ref Rng & Units 09/27/2022  12:35 AM  BMP  Glucose 70 - 99 mg/dL 246   BUN 8 - 23 mg/dL 51   Creatinine 0.61 - 1.24 mg/dL 5.40   Sodium 135 - 145 mmol/L 132   Potassium 3.5 - 5.1 mmol/L 4.3   Chloride 98 - 111 mmol/L 90   CO2 22 - 32 mmol/L 21   Calcium 8.9 - 10.3 mg/dL 8.4     Camelia Phenes, MD 09/27/2022, 12:31 PM  PGY-1, [place] Intern pager: [phone],  text pages welcome Secure chat group [place]

## 2022-09-27 NOTE — Progress Notes (Signed)
Patient has been in Dialysis today since 59a. No mobility as of now.

## 2022-09-27 NOTE — Progress Notes (Signed)
[facility] Progress Note    Assessment/ Plan:   Sepsis / bilat PNA - on IV abx & steroids per pmd Acute systolic HF / NSTEMI - +trop 5500, s/p IV heparin per cardiology. LVEF 30-35% by echo here. Ischemic workup planned for outpatient AHRF - on HFNC currently. Uf'ing as tolerated ESRD - on HD TTS. S/p extra HD treatment 2/26. back on TTS schedule today HTN/ volume - BP's acceptable, is below dry wt w/ sig resp distress initially, suspected vol overload. Has lost body wt. 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 Follow. 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 Cont po vdra.   OP HD: TTS G-O 3h 22mn  400/700   72.5kg  2/2.5 bath  LUA AVF  Hep 2000 - last HD 2/22, post wt 72.6kg   - rocaltrol 0.50 mcg po tiw - venofer 50 mg weekly IV - mircera 120 mcg IV q 4 wks, last 2/15, due 2/29  Subjective:   Patient seen and examined bedside. Eating breakfast. He reports that his breathing a little better today. Tolerated extra HD treatment yesterday with net uf 2.8L   Objective:   BP (!) 153/84 (BP Location: Right Arm)   Pulse 90   Temp 98 F (36.7 C) (Oral)   Resp 19   Ht '5\' 3"'$  (1.6 m)   Wt 72.1 kg   SpO2 98%   BMI 28.16 kg/m   Intake/Output Summary (Last 24 hours) at 09/27/2022 0818 Last data filed at 09/27/2022 0816 Gross per 24 hour  Intake 694.81 ml  Output --  Net 694.81 ml   Weight change: 1.3 kg  Physical Exam: Gen: NAD CVS: RRR Resp: diminished air entry bibasilar Abd: soft Ext: left bka, no edema Neuro: awake, alert Dialysis access: LUE AVF +b/t  Imaging: No results found.  Labs: BMET Recent Labs  Lab 09/22/22 1633 09/23/22 0126 09/23/22 1238 09/24/22 0109 09/25/22 0040 09/26/22 0045 09/27/22 0035  NA 135 137  --  129* 133* 129* 132*  K 3.5 3.9  --  3.8 4.4 4.1 4.3  CL 90* 93*  --  88* 92* 89* 90*  CO2 25 20*  --  20* 21* 22 21*  GLUCOSE 125* 173*  --  135* 132* 187* 246*  BUN 16  28*  --  53* 34* 60* 51*  CREATININE 3.06* 4.51*  --  6.12* 4.80* 6.48* 5.40*  CALCIUM 8.5* 7.9*  --  7.5* 8.1* 7.7* 8.4*  PHOS  --   --  5.0* 5.6* 4.5 4.6 4.6   CBC Recent Labs  Lab 09/22/22 1633 09/23/22 0126 09/24/22 0109 09/25/22 0040 09/26/22 0045 09/27/22 0035  WBC 17.2* 13.3* 10.2 12.2* 10.6* 9.7  NEUTROABS 15.3* 12.0*  --   --   --   --   HGB 9.9* 8.5* 8.7* 9.1* 8.6* 8.8*  HCT 30.9* 26.2* 26.5* 27.8* 26.7* 27.9*  MCV 100.3* 100.8* 97.8 98.6 98.5 98.6  PLT 223 180 181 185 175 171    Medications:     apixaban  10 mg Oral BID   Followed by   [Derrill MemoON 09/30/2022] apixaban  5 mg Oral BID   aspirin EC  81 mg Oral Daily   atorvastatin  20 mg Oral Daily   calcitRIOL  0.5 mcg Oral Q T,Th,Sa-HD   Chlorhexidine Gluconate Cloth  6 each Topical Q0600   [START ON 09/29/2022] darbepoetin (ARANESP) injection - DIALYSIS  150 mcg  Subcutaneous Q Thu-1800   heparin  2,000 Units Dialysis Once in dialysis   insulin aspart  0-6 Units Subcutaneous TID WC   methylPREDNISolone (SOLU-MEDROL) injection  40 mg Intravenous Daily   metoprolol succinate  25 mg Oral Daily   sevelamer carbonate  800 mg Oral TID WC      Gean Quint, MD [facility] 09/27/2022, 8:18 AM

## 2022-09-27 NOTE — Progress Notes (Signed)
   09/27/22 1140  Vitals  BP 103/67  MAP (mmHg) 78  BP Location Right Arm  BP Method Automatic  Patient Position (if appropriate) Lying  Pulse Rate (!) 109  Pulse Rate Source Monitor  ECG Heart Rate (!) 107  Resp (!) 27  Oxygen Therapy  SpO2 100 %  O2 Device Bi-PAP  During Treatment Monitoring  Ultrafiltration Rate (mL/min) 716 mL/min (Uf turned back on goal decreased to 2L to maintain bp 22mn left in tx. Dr. VLoyal Gambler notified)

## 2022-09-27 NOTE — Progress Notes (Signed)
Jerilee Hoh, MD (Family Medicine), notified of morning blood sugar of 302 and no insulin coverage ordered. No new orders at this time.

## 2022-09-27 NOTE — Progress Notes (Signed)
Inpatient Diabetes Program Recommendations  AACE/ADA: New Consensus Statement on Inpatient Glycemic Control (2015)  Target Ranges:  Prepandial:   less than 140 mg/dL      Peak postprandial:   less than 180 mg/dL (1-2 hours)      Critically ill patients:  140 - 180 mg/dL   Lab Results  Component Value Date   GLUCAP 302 (H) 09/27/2022   HGBA1C 5.6 09/23/2022    Review of Glycemic Control  Latest Reference Range & Units 09/26/22 17:40 09/26/22 21:12 09/27/22 06:18  Glucose-Capillary 70 - 99 mg/dL 160 (H) 193 (H) 302 (H)   Diabetes history: DM  Outpatient Diabetes medications:  None Current orders for Inpatient glycemic control:  Novolog 0-6 units tid with meals  Solumedrol 40 mg IV daily (2 more doses) Inpatient Diabetes Program Recommendations:    Agree with the addition of Novolog correction.  Will follow.   Thanks,  Adah Perl, RN, BC-ADM Inpatient Diabetes Coordinator Pager [phone]  (8a-5p)

## 2022-09-27 NOTE — Progress Notes (Signed)
Arrived back from Hemodialysis unit in bed. Vitals and blood glucose obtained.

## 2022-09-27 NOTE — Progress Notes (Signed)
NAME:  [name], MRN:  [id number], DOB:  [birth year], LOS: 4 ADMISSION DATE:  09/22/2022, CONSULTATION DATE: 2/24 REFERRING MD: Dr. Marcina Millard, CHIEF COMPLAINT: Lethargy, nonproductive cough  History of Present Illness:  67 year old male who presented to [facility] ER on 2/22 with reports of nonproductive cough for 3 days, lethargy and low oxygen saturations.  Patient is a never smoker.  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  He lives at [facility] currently for care.  Baseline ESRD on HD.  Prior left lower extremity BKA, right transmetatarsal amputation.  Facility noted him to be lethargic and assessed a chest x-ray which was concerning for possible pneumonia.  He reported a nonproductive cough for 3 days.  On EMS arrival saturations were 86% on room air and 4 L per nasal cannula applied with improvement in saturations.  Patient had not missed any hemodialysis treatments.  Initial chest x-ray in the ER concerning for right basilar pneumonia.  Labs notable for mild hyperglycemia, BUN 16, creatinine 3.06, BNP 3189, lactic acid 2.3 which cleared to 1.6, high-sensitivity troponin peak of 6320, WBC 17.2, hemoglobin 9.9, MCV 100.3, platelets 223.  Patient was admitted per family practice teaching service with concern for sepsis in the setting of pneumonia.  Cardiology was consulted for evaluation of acute systolic heart failure.  ECHO showed reduction of LVEF to 30-35% with some concern for possible Takotsubo type cardiomyopathy.  Prior cath showed diffuse disease.  It was felt that GDMT was limited due to ESRD.  Patient required BiPAP support for tachypnea.  CTA PE evaluated which was negative for acute pulmonary embolism, however it was noted that there was a weblike area in the left lower lobe pulmonary artery seen passing through the pulmonary artery which was favored to represent prior embolism, other findings suspicious for multifocal pneumonia and associated bilateral effusions.  Lower extremity venous duplex  assessed which revealed age-indeterminate right popliteal DVT.  PCCM consulted 2/24 pulmonary evaluation.  Pertinent  Medical History  ESRD on HD Diabetes mellitus Hypertension Hyperlipidemia Ischemic and hypertensive cardiomyopathy with a EF of 50 to 55% in 2021 Necrotizing fasciitis Never smoker  Significant Hospital Events: Including procedures, antibiotic start and stop dates in addition to other pertinent events   2/22 admit with right lower lobe pneumonia and sepsis 2/24 PCCM consulted for evaluation of tachypnea, BiPAP requirement 2/27 PCCM re-consulted for tachypnea, pt still on bipap, states not feeling worse  Interim History / Subjective:  Pt seen on bipap, he is awake and alert and through tachypneic, denies feeling short of breath or any chest pain  Objective   Blood pressure 117/79, pulse 95, temperature 97.9 F (36.6 C), temperature source Axillary, resp. rate (!) 40, height '5\' 3"'$  (1.6 m), weight 68.9 kg, SpO2 100 %.    FiO2 (%):  [50 %-60 %] 50 %   Intake/Output Summary (Last 24 hours) at 09/27/2022 1249 Last data filed at 09/27/2022 1228 Gross per 24 hour  Intake 934.81 ml  Output 2100 ml  Net -1165.19 ml    Filed Weights   09/27/22 0025 09/27/22 0856 09/27/22 1228  Weight: 72.1 kg 71 kg 68.9 kg    General:  thin, chronically ill appearing elderly M on bipap in no distress HEENT: MM pink/moist, sclera anicteric Neuro: awake, alert and oriented  CV: s1s2 rrr, no m/r/g PULM:  tachypneic with respiratory rate in 40's, speaking in full sentences without distress, no rhonchi or wheezing, tolerating bipap mask GI: soft, non-tender Extremities: warm/dry, s/p L BKA and R toe amputation,  no edema  Skin: multiple abrasions and superficial skin tears of the LE   Resolved Hospital Problem list     Assessment & Plan:    Acute Hypoxic Respiratory Failure secondary to RLL PNA  Bilateral Pleural Effusions Non-productive cough prior to admit.  Has been on  Bipap the last several days and treated with Cefepime, PCCM called back for tachypnea Subjectively pt is feeling improved WBC improved -repeat CXR to eval for enlarging effusions -recommend transition to HFNC and stress importance of IS -continue cefepime  -cultures and RVP negative -wean O2 for sats >90% -pulmonary hygiene -IS, mobilize as able  -thank you for this consult, PCCM will continue to follow with you  Acute Systolic CHF, LVEF [id number] HTN, HLD, Multi-Vessel CAD  -per Cardiology  -continue heparin infusion per pharmacy  -continue lopressor '25mg'$  BID  -lipitor '20mg'$  QD   ESRD on HD  -volume removal per HD   Best Practice (right click and "Reselect all SmartList Selections" daily)  Per PFTS   Labs   CBC: Recent Labs  Lab 09/22/22 1633 09/23/22 0126 09/24/22 0109 09/25/22 0040 09/26/22 0045 09/27/22 0035  WBC 17.2* 13.3* 10.2 12.2* 10.6* 9.7  NEUTROABS 15.3* 12.0*  --   --   --   --   HGB 9.9* 8.5* 8.7* 9.1* 8.6* 8.8*  HCT 30.9* 26.2* 26.5* 27.8* 26.7* 27.9*  MCV 100.3* 100.8* 97.8 98.6 98.5 98.6  PLT 223 180 181 185 175 171     Basic Metabolic Panel: Recent Labs  Lab 09/23/22 0120 09/23/22 0126 09/23/22 1238 09/24/22 0109 09/25/22 0040 09/26/22 0045 09/27/22 0035  NA  --  137  --  129* 133* 129* 132*  K  --  3.9  --  3.8 4.4 4.1 4.3  CL  --  93*  --  88* 92* 89* 90*  CO2  --  20*  --  20* 21* 22 21*  GLUCOSE  --  173*  --  135* 132* 187* 246*  BUN  --  28*  --  53* 34* 60* 51*  CREATININE  --  4.51*  --  6.12* 4.80* 6.48* 5.40*  CALCIUM  --  7.9*  --  7.5* 8.1* 7.7* 8.4*  MG 2.1  --   --   --   --   --   --   PHOS  --   --  5.0* 5.6* 4.5 4.6 4.6    GFR: Estimated Creatinine Clearance: 12.1 mL/min (A) (by C-G formula based on SCr of 5.4 mg/dL (H)). Recent Labs  Lab 09/22/22 1633 09/22/22 2241 09/23/22 0126 09/24/22 0109 09/25/22 0040 09/26/22 0045 09/27/22 0035  WBC 17.2*  --    < > 10.2 12.2* 10.6* 9.7  LATICACIDVEN 2.3* 1.6  --   --    --   --   --    < > = values in this interval not displayed.     Liver Function Tests: Recent Labs  Lab 09/22/22 1633 09/23/22 0126 09/24/22 0109 09/25/22 0040 09/26/22 0045 09/27/22 0035  AST 35 26  --   --   --   --   ALT 19 16  --   --   --   --   ALKPHOS 62 51  --   --   --   --   BILITOT 1.4* 1.8*  --   --   --   --   PROT 7.4 6.2*  --   --   --   --  ALBUMIN 3.2* 2.5* 2.3* 2.3* 2.1* 2.2*    No results for input(s): "LIPASE", "AMYLASE" in the last 168 hours. No results for input(s): "AMMONIA" in the last 168 hours.  ABG    Component Value Date/Time   HCO3 25.9 09/23/2022 1653   TCO2 26 07/11/2022 0907   ACIDBASEDEF 2.0 12/13/2019 1509   O2SAT 72.2 09/23/2022 1653     Coagulation Profile: Recent Labs  Lab 09/22/22 1633  INR 1.0     Cardiac Enzymes: No results for input(s): "CKTOTAL", "CKMB", "CKMBINDEX", "TROPONINI" in the last 168 hours.  HbA1C: Hgb A1c MFr Bld  Date/Time Value Ref Range Status  09/23/2022 01:26 AM 5.6 4.8 - 5.6 % Final    Comment:    (NOTE) Pre diabetes:          5.7%-6.4%  Diabetes:              >6.4%  Glycemic control for   <7.0% adults with diabetes   11/29/2019 04:26 AM 5.5 4.8 - 5.6 % Final    Comment:    (NOTE) Pre diabetes:          5.7%-6.4% Diabetes:              >6.4% Glycemic control for   <7.0% adults with diabetes     CBG: Recent Labs  Lab 09/26/22 0556 09/26/22 1102 09/26/22 1740 09/26/22 2112 09/27/22 0618  GLUCAP 164* 167* 160* 193* 302*     Review of Systems: Positives in [place]  Please see the history of present illness. All other systems reviewed and are negative    Past Medical History:  He,  has a past medical history of CHF (congestive heart failure) ([place]), Chronic kidney disease, Diabetes mellitus without complication ([place]), Hepatitis, Hyperlipemia, Hypertension, Necrotizing fasciitis ([place]), and Pneumonia.   Surgical History:   Past Surgical History:  Procedure Laterality Date    AMPUTATION Left 09/11/2013   Procedure: AMPUTATION BELOW KNEE;  Surgeon: Marianna Payment, MD;  Location: [facility];  Service: Orthopedics;  Laterality: Left;   AMPUTATION Right 12/18/2013   Procedure: RIGHT FOOT 1,2, TOE AMPUTATION  5th toe RAY AMPUTATION;  Surgeon: Marianna Payment, MD;  Location: [facility];  Service: Orthopedics;  Laterality: Right;   AV FISTULA PLACEMENT Left 12/11/2019   Procedure: LEFT BRACHIOCEPHALIC FISTULA CREATION;  Surgeon: Rosetta Posner, MD;  Location: [facility];  Service: Vascular;  Laterality: Left;   CHOLECYSTECTOMY     I & D EXTREMITY Left 09/03/2013   Procedure: IRRIGATION AND DEBRIDEMENT EXTREMITY;  Surgeon: Marianna Payment, MD;  Location: [facility];  Service: Orthopedics;  Laterality: Left;   I & D EXTREMITY Left 09/11/2013   Procedure: LEFT FOOT IRRIGATION AND DEBRIDEMENT;  Surgeon: Marianna Payment, MD;  Location: [facility];  Service: Orthopedics;  Laterality: Left;   INSERTION OF DIALYSIS CATHETER  12/11/2019   Procedure: Insertion Of Dialysis Catheter;  Surgeon: Rosetta Posner, MD;  Location: [facility];  Service: Vascular;;   INSERTION OF DIALYSIS CATHETER Right 07/11/2022   Procedure: INSERTION OF TUNNELED DIALYSIS CATHETER;  Surgeon: Angelia Mould, MD;  Location: [facility];  Service: Vascular;  Laterality: Right;   LEFT HEART CATH AND CORONARY ANGIOGRAPHY N/A 12/13/2019   Procedure: LEFT HEART CATH AND CORONARY ANGIOGRAPHY;  Surgeon: Jettie Booze, MD;  Location: [facility];  Service: Cardiovascular;  Laterality: N/A;   REVISON OF ARTERIOVENOUS FISTULA Left 07/11/2022   Procedure: REVISON OF LEFT ARM ARTERIOVENOUS FISTULA;  Surgeon: Angelia Mould, MD;  Location[facility];  Service: Vascular;  Laterality: Left;   RIGHT HEART CATH N/A 12/13/2019   Procedure: RIGHT HEART CATH;  Surgeon: Jettie Booze, MD;  Location: [facility];  Service: Cardiovascular;  Laterality: N/A;     Social History:   reports that he has never smoked. He has never  used smokeless tobacco. He reports that he does not drink alcohol and does not use drugs.   Family History:  His family history includes Dementia in his mother; Diabetes type II in his mother; Heart disease in his father.   Allergies Allergies  Allergen Reactions   Tramadol Nausea And Vomiting    Pt states he took this on an empty stomach one time and it came right back up. He's not had it since.      Home Medications  Prior to Admission medications   Medication Sig Start Date End Date Taking? Authorizing Provider  amLODipine (NORVASC) 5 MG tablet Take 5 mg by mouth daily. 07/22/21   [provider]  aspirin EC 81 MG tablet Take 81 mg by mouth daily. Swallow whole.    [provider]  atorvastatin (LIPITOR) 20 MG tablet Take 1 tablet (20 mg total) by mouth daily. 12/17/19   [name], MD  carvedilol (COREG) 25 MG tablet Take 1 tablet (25 mg total) by mouth 2 (two) times daily with a meal. Patient taking differently: Take 12.5 mg by mouth 2 (two) times daily with a meal. 12/16/19   Christian, Rylee, MD  hydrocerin (EUCERIN) CREA Apply 1 application topically 2 (two) times daily. 12/16/19   [name], MD  isosorbide mononitrate (IMDUR) 30 MG 24 hr tablet Take 0.5 tablets (15 mg total) by mouth daily. Patient taking differently: Take 30 mg by mouth daily. 12/17/19   [name], MD  Multiple Vitamins-Minerals (MULTIVITAMIN WITH MINERALS) tablet Take 1 tablet by mouth daily with breakfast.    [provider]  oxyCODONE-acetaminophen (PERCOCET) 5-325 MG tablet Take 1 tablet by mouth every 4 (four) hours as needed for severe pain. 07/11/22 07/11/23  Angelia Mould, MD  sevelamer carbonate (RENVELA) 800 MG tablet Take 2 tablets (1,600 mg total) by mouth 3 (three) times daily with meals. 12/16/19   [name], MD         Otilio Carpen Ratasha Fabre, PA-C  Pulmonary & Critical care See Amion for pager If no response to pager , please call [phone]  [phone] until 7pm After 7:00 pm call Elink  [phone]?[place]

## 2022-09-27 NOTE — Procedures (Signed)
I was present at this dialysis session. I have reviewed the session itself and made appropriate changes.   Filed Weights   09/26/22 1830 09/27/22 0025 09/27/22 0856  Weight: 71.7 kg 72.1 kg 71 kg    Recent Labs  Lab 09/27/22 0035  NA 132*  K 4.3  CL 90*  CO2 21*  GLUCOSE 246*  BUN 51*  CREATININE 5.40*  CALCIUM 8.4*  PHOS 4.6    Recent Labs  Lab 09/22/22 1633 09/23/22 0126 09/24/22 0109 09/25/22 0040 09/26/22 0045 09/27/22 0035  WBC 17.2* 13.3*   < > 12.2* 10.6* 9.7  NEUTROABS 15.3* 12.0*  --   --   --   --   HGB 9.9* 8.5*   < > 9.1* 8.6* 8.8*  HCT 30.9* 26.2*   < > 27.8* 26.7* 27.9*  MCV 100.3* 100.8*   < > 98.6 98.5 98.6  PLT 223 180   < > 185 175 171   < > = values in this interval not displayed.    Scheduled Meds:  apixaban  10 mg Oral BID   Followed by   Derrill Memo ON 09/30/2022] apixaban  5 mg Oral BID   aspirin EC  81 mg Oral Daily   atorvastatin  20 mg Oral Daily   calcitRIOL  0.5 mcg Oral Q T,Th,Sa-HD   Chlorhexidine Gluconate Cloth  6 each Topical Q0600   [START ON 09/29/2022] darbepoetin (ARANESP) injection - DIALYSIS  150 mcg Subcutaneous Q Thu-1800   insulin aspart  0-6 Units Subcutaneous TID WC   methylPREDNISolone (SOLU-MEDROL) injection  40 mg Intravenous Daily   metoprolol succinate  25 mg Oral Daily   sevelamer carbonate  800 mg Oral TID WC   Continuous Infusions:  ceFEPime (MAXIPIME) IV 200 mL/hr at 09/27/22 0816   PRN Meds:Gean Quint, MD [facility] 09/27/2022, 10:04 AM

## 2022-09-27 NOTE — Progress Notes (Signed)
Received patient in bed to unit.  Alert and oriented.  Informed consent signed and in chart.   TX duration:3 hours  Patient tolerated well.  Transported back to the room  Alert, without acute distress.  Hand-off given to patient's nurse.   Access used: left AVF Access issues: none  Total UF removed: 2.1L Medication(s) given: none    09/27/22 1228  Vitals  Temp 97.9 F (36.6 C)  Temp Source Axillary  BP 97/62  MAP (mmHg) 74  BP Location Right Arm  BP Method Automatic  Patient Position (if appropriate) Lying  Pulse Rate 96  Pulse Rate Source Monitor  ECG Heart Rate 96  Resp (!) 44  Oxygen Therapy  SpO2 100 %  O2 Device Bi-PAP  During Treatment Monitoring  Intra-Hemodialysis Comments Tx completed  Dialysis Fluid Bolus Normal Saline  Bolus Amount (mL) 300 mL  Post Treatment  Dialyzer Clearance Lightly streaked  Duration of HD Treatment -hour(s) 3 hour(s)  Liters Processed 63.3  Fluid Removed (mL) 2100 mL  Tolerated HD Treatment Yes  Fistula / Graft Left Upper arm Arteriovenous fistula  Placement Date/Time: 12/11/19 1414   Placed prior to admission: No  Orientation: Left  Access Location: Upper arm  Access Type: (c) Arteriovenous fistula  Status Deaccessed;Flushed      Yeilyn Gent [facility]

## 2022-09-27 NOTE — Progress Notes (Signed)
FMTS Brief Progress Note  S: Laying comfortably in bed and not wanting to be disturbed so he can sleep  O: BP 131/72 (BP Location: Right Arm)   Pulse (!) 102   Temp 98.2 F (36.8 C) (Oral)   Resp 19   Ht '5\' 3"'$  (1.6 m)   Wt 68.9 kg   SpO2 99%   BMI 26.91 kg/m   General: Sleeping comfortably, arousable to voice, NAD Pulm: Normal WOB, HFNC in place on 4 L  A/P: Pneumonia Repeat CXR showed improving pulmonary edema. Unchanged small bilateral pleural effusions.  BNP elevated to 3500, although fluid status should be well-controlled with HD. -Continue HFNC, not BiPAP, per CCM -Continue plan per day team  Prediabetes A1c of 5.6, unfortunately steroid dosing has worsened this in the acute setting. Most recent CBG 412.  I have contacted RN to give NovoLog 6 units and recheck in 1 hour. -Continue very sensitive SSI - Orders reviewed. Labs for AM ordered, which was adjusted as needed.   Wells Guiles, DO 09/27/2022, 11:22 PM PGY-2, [facility] Night Resident  Please page [phone] with questions.

## 2022-09-27 NOTE — Progress Notes (Signed)
Patient transported from Dialysis back to room 3E27 on Bipap with no problems.

## 2022-09-27 NOTE — Progress Notes (Signed)
Patient transported from [place] to Dialysis on Bipap with no problems. Patient will remain on Bipap for dialysis.

## 2022-09-28 DIAGNOSIS — I824Z9 Acute embolism and thrombosis of unspecified deep veins of unspecified distal lower extremity: Secondary | ICD-10-CM | POA: Diagnosis not present

## 2022-09-28 DIAGNOSIS — J159 Unspecified bacterial pneumonia: Secondary | ICD-10-CM | POA: Diagnosis not present

## 2022-09-28 DIAGNOSIS — I214 Non-ST elevation (NSTEMI) myocardial infarction: Secondary | ICD-10-CM | POA: Diagnosis not present

## 2022-09-28 DIAGNOSIS — N186 End stage renal disease: Secondary | ICD-10-CM | POA: Diagnosis not present

## 2022-09-28 DIAGNOSIS — J9601 Acute respiratory failure with hypoxia: Secondary | ICD-10-CM | POA: Diagnosis not present

## 2022-09-28 LAB — BASIC METABOLIC PANEL
Anion gap: 19 — ABNORMAL HIGH (ref 5–15)
BUN: 63 mg/dL — ABNORMAL HIGH (ref 8–23)
CO2: 28 mmol/L (ref 22–32)
Calcium: 8.1 mg/dL — ABNORMAL LOW (ref 8.9–10.3)
Chloride: 88 mmol/L — ABNORMAL LOW (ref 98–111)
Creatinine, Ser: 4.69 mg/dL — ABNORMAL HIGH (ref 0.61–1.24)
GFR, Estimated: 13 mL/min — ABNORMAL LOW (ref >60)
Glucose, Bld: 386 mg/dL — ABNORMAL HIGH (ref 70–99)
Potassium: 4.1 mmol/L (ref 3.5–5.1)
Sodium: 135 mmol/L (ref 135–145)

## 2022-09-28 LAB — CBC
HCT: 28.8 % — ABNORMAL LOW (ref 39.0–52.0)
Hemoglobin: 9.6 g/dL — ABNORMAL LOW (ref 13.0–17.0)
MCH: 32 pg (ref 26.0–34.0)
MCHC: 33.3 g/dL (ref 30.0–36.0)
MCV: 96 fL (ref 80.0–100.0)
Platelets: 220 10*3/uL (ref 150–400)
RBC: 3 MIL/uL — ABNORMAL LOW (ref 4.22–5.81)
RDW: 15.4 % (ref 11.5–15.5)
WBC: 14 10*3/uL — ABNORMAL HIGH (ref 4.0–10.5)
nRBC: 0 % (ref 0.0–0.2)

## 2022-09-28 LAB — GLUCOSE, CAPILLARY
Glucose-Capillary: 355 mg/dL — ABNORMAL HIGH (ref 70–99)
Glucose-Capillary: 305 mg/dL — ABNORMAL HIGH (ref 70–99)
Glucose-Capillary: 306 mg/dL — ABNORMAL HIGH (ref 70–99)
Glucose-Capillary: 223 mg/dL — ABNORMAL HIGH (ref 70–99)
Glucose-Capillary: 291 mg/dL — ABNORMAL HIGH (ref 70–99)
Glucose-Capillary: 304 mg/dL — ABNORMAL HIGH (ref 70–99)
Glucose-Capillary: 276 mg/dL — ABNORMAL HIGH (ref 70–99)

## 2022-09-28 MED ORDER — INSULIN ASPART 100 UNIT/ML IJ SOLN
5.0000 [IU] | Freq: Once | INTRAMUSCULAR | Status: AC
  Administered 2022-09-28: 5 [IU] via SUBCUTANEOUS

## 2022-09-28 MED ORDER — INSULIN ASPART 100 UNIT/ML IJ SOLN
3.0000 [IU] | Freq: Once | INTRAMUSCULAR | Status: AC
  Administered 2022-09-28: 3 [IU] via SUBCUTANEOUS

## 2022-09-28 MED ORDER — ONDANSETRON HCL 4 MG/2ML IJ SOLN
4.0000 mg | Freq: Three times a day (TID) | INTRAMUSCULAR | Status: DC | PRN
  Administered 2022-09-28 – 2022-09-29 (×2): 4 mg via INTRAVENOUS
  Filled 2022-09-28 (×2): qty 2

## 2022-09-28 MED ORDER — INSULIN GLARGINE-YFGN 100 UNIT/ML ~~LOC~~ SOLN
8.0000 [IU] | Freq: Once | SUBCUTANEOUS | Status: AC
  Administered 2022-09-28: 8 [IU] via SUBCUTANEOUS
  Filled 2022-09-28: qty 0.08

## 2022-09-28 MED ORDER — ONDANSETRON 4 MG PO TBDP: 4.0000 mg | ORAL_TABLET | Freq: Once | ORAL | Status: DC

## 2022-09-28 MED ORDER — INSULIN GLARGINE-YFGN 100 UNIT/ML ~~LOC~~ SOLN
8.0000 [IU] | Freq: Every day | SUBCUTANEOUS | Status: DC
  Filled 2022-09-28: qty 0.08

## 2022-09-28 NOTE — Progress Notes (Signed)
NAME:  [name], MRN:  [id number], DOB:  1955/10/25, LOS: 5 ADMISSION DATE:  09/22/2022, CONSULTATION DATE: 2/24 REFERRING MD: Dr. Marcina Millard, CHIEF COMPLAINT: Lethargy, nonproductive cough  History of Present Illness:  67 year old male who presented to [facility] ER on 2/22 with reports of nonproductive cough for 3 days, lethargy and low oxygen saturations.  Patient is a never smoker.  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  He lives at [place] facility currently for care.  Baseline ESRD on HD.  Prior left lower extremity BKA, right transmetatarsal amputation.  Facility noted him to be lethargic and assessed a chest x-ray which was concerning for possible pneumonia.  He reported a nonproductive cough for 3 days.  On EMS arrival saturations were 86% on room air and 4 L per nasal cannula applied with improvement in saturations.  Patient had not missed any hemodialysis treatments.  Initial chest x-ray in the ER concerning for right basilar pneumonia.  Labs notable for mild hyperglycemia, BUN 16, creatinine 3.06, BNP 3189, lactic acid 2.3 which cleared to 1.6, high-sensitivity troponin peak of 6320, WBC 17.2, hemoglobin 9.9, MCV 100.3, platelets 223.  Patient was admitted per family practice teaching service with concern for sepsis in the setting of pneumonia.  Cardiology was consulted for evaluation of acute systolic heart failure.  ECHO showed reduction of LVEF to 30-35% with some concern for possible Takotsubo type cardiomyopathy.  Prior cath showed diffuse disease.  It was felt that GDMT was limited due to ESRD.  Patient required BiPAP support for tachypnea.  CTA PE evaluated which was negative for acute pulmonary embolism, however it was noted that there was a weblike area in the left lower lobe pulmonary artery seen passing through the pulmonary artery which was favored to represent prior embolism, other findings suspicious for multifocal pneumonia and associated bilateral effusions.  Lower extremity venous duplex  assessed which revealed age-indeterminate right popliteal DVT.  PCCM consulted 2/24 pulmonary evaluation.  Pertinent  Medical History  ESRD on HD Diabetes mellitus Hypertension Hyperlipidemia Ischemic and hypertensive cardiomyopathy with a EF of 50 to 55% in 2021 Necrotizing fasciitis Never smoker  Significant Hospital Events: Including procedures, antibiotic start and stop dates in addition to other pertinent events   2/22 admit with right lower lobe pneumonia and sepsis 2/24 PCCM consulted for evaluation of tachypnea, BiPAP requirement 2/27 PCCM re-consulted for tachypnea, pt still on bipap, states not feeling worse  Interim History / Subjective:  Continues to improve with negative INO  Objective   Blood pressure 116/67, pulse (!) 101, temperature 98 F (36.7 C), temperature source Oral, resp. rate 19, height '5\' 3"'$  (1.6 m), weight 51.4 kg, SpO2 97 %.    FiO2 (%):  [45 %] 45 %   Intake/Output Summary (Last 24 hours) at 09/28/2022 0919 Last data filed at 09/28/2022 0757 Gross per 24 hour  Intake 417 ml  Output 2100 ml  Net -1683 ml   Filed Weights   09/27/22 1228 09/28/22 0035 09/28/22 0839  Weight: 68.9 kg 51.4 kg 51.4 kg    General: 67 year old male who appears older than stated age 81: MM pink/moist no JVD is appreciated Neuro: Grossly intact without focal defect CV: Heart sounds are distant PULM: Diminished throughout currently on 5 L nasal cannula with O2 saturations of 97%  GI: soft, bsx4 active  Extremities: Left BKA is noted.  Right transmetatarsal Skin: no rashes or lesions    Resolved Hospital Problem list     Assessment & Plan:    Acute Hypoxic  Respiratory Failure secondary to RLL PNA  Bilateral Pleural Effusions Non-productive cough prior to admit.  Has been on Bipap the last several days and treated with Cefepime, PCCM called back for tachypnea Subjectively pt is feeling improved WBC improved Chest x-ray continues to improve with negative  bilateral  Continue negative INO as tolerated Wean FiO2 as tolerated Continue pulmonary hygiene    Acute Systolic CHF, LVEF [id number] HTN, HLD, Multi-Vessel CAD  Per cardiology Continue beta-blocker Continue statin He has transition to Eliquis     ESRD on HD   Intake/Output Summary (Last 24 hours) at 09/28/2022 0920 Last data filed at 09/28/2022 0757 Gross per 24 hour  Intake 417 ml  Output 2100 ml  Net -1683 ml   Continue to pull negative per renal service  Best Practice (right click and "Reselect all SmartList Selections" daily)  Per PFTS   Labs   CBC: Recent Labs  Lab 09/22/22 1633 09/23/22 0126 09/24/22 0109 09/25/22 0040 09/26/22 0045 09/27/22 0035 09/28/22 0047  WBC 17.2* 13.3* 10.2 12.2* 10.6* 9.7 14.0*  NEUTROABS 15.3* 12.0*  --   --   --   --   --   HGB 9.9* 8.5* 8.7* 9.1* 8.6* 8.8* 9.6*  HCT 30.9* 26.2* 26.5* 27.8* 26.7* 27.9* 28.8*  MCV 100.3* 100.8* 97.8 98.6 98.5 98.6 96.0  PLT 223 180 181 185 175 171 [id number]    Basic Metabolic Panel: Recent Labs  Lab 09/23/22 0120 09/23/22 0126 09/23/22 1238 09/24/22 0109 09/25/22 0040 09/26/22 0045 09/27/22 0035 09/28/22 0047  NA  --    < >  --  129* 133* 129* 132* 135  K  --    < >  --  3.8 4.4 4.1 4.3 4.1  CL  --    < >  --  88* 92* 89* 90* 88*  CO2  --    < >  --  20* 21* 22 21* 28  GLUCOSE  --    < >  --  135* 132* 187* 246* 386*  BUN  --    < >  --  53* 34* 60* 51* 63*  CREATININE  --    < >  --  6.12* 4.80* 6.48* 5.40* 4.69*  CALCIUM  --    < >  --  7.5* 8.1* 7.7* 8.4* 8.1*  MG 2.1  --   --   --   --   --   --   --   PHOS  --   --  5.0* 5.6* 4.5 4.6 4.6  --    < > = values in this interval not displayed.   GFR: Estimated Creatinine Clearance: 11.6 mL/min (A) (by C-G formula based on SCr of 4.69 mg/dL (H)). Recent Labs  Lab 09/22/22 1633 09/22/22 2241 09/23/22 0126 09/25/22 0040 09/26/22 0045 09/27/22 0035 09/28/22 0047  WBC 17.2*  --    < > 12.2* 10.6* 9.7 14.0*  LATICACIDVEN 2.3* 1.6   --   --   --   --   --    < > = values in this interval not displayed.    Liver Function Tests: Recent Labs  Lab 09/22/22 1633 09/23/22 0126 09/24/22 0109 09/25/22 0040 09/26/22 0045 09/27/22 0035  AST 35 26  --   --   --   --   ALT 19 16  --   --   --   --   ALKPHOS 62 51  --   --   --   --  BILITOT 1.4* 1.8*  --   --   --   --   PROT 7.4 6.2*  --   --   --   --   ALBUMIN 3.2* 2.5* 2.3* 2.3* 2.1* 2.2*   No results for input(s): "LIPASE", "AMYLASE" in the last 168 hours. No results for input(s): "AMMONIA" in the last 168 hours.  ABG    Component Value Date/Time   HCO3 25.9 09/23/2022 1653   TCO2 26 07/11/2022 0907   ACIDBASEDEF 2.0 12/13/2019 1509   O2SAT 72.2 09/23/2022 1653     Coagulation Profile: Recent Labs  Lab 09/22/22 1633  INR 1.0    Cardiac Enzymes: No results for input(s): "CKTOTAL", "CKMB", "CKMBINDEX", "TROPONINI" in the last 168 hours.  HbA1C: Hgb A1c MFr Bld  Date/Time Value Ref Range Status  09/23/2022 01:26 AM 5.6 4.8 - 5.6 % Final    Comment:    (NOTE) Pre diabetes:          5.7%-6.4%  Diabetes:              >6.4%  Glycemic control for   <7.0% adults with diabetes   11/29/2019 04:26 AM 5.5 4.8 - 5.6 % Final    Comment:    (NOTE) Pre diabetes:          5.7%-6.4% Diabetes:              >6.4% Glycemic control for   <7.0% adults with diabetes     CBG: Recent Labs  Lab 09/27/22 2118 09/28/22 0106 09/28/22 0158 09/28/22 0222 09/28/22 0620  GLUCAP 412* 355* 305* 306* 57*        Steve Kadie Balestrieri ACNP Acute Care Nurse Practitioner [place] Please consult Amion 09/28/2022, 9:19 AM

## 2022-09-28 NOTE — Progress Notes (Signed)
[facility] Progress Note    Assessment/ Plan:   Sepsis / bilat PNA - on IV abx & steroids per pmd Acute systolic HF / NSTEMI - +trop 5500, s/p IV heparin per cardiology. LVEF 30-35% by echo here. Ischemic workup planned for outpatient AHRF - Uf'ing as tolerated ESRD - on HD TTS. S/p extra HD treatment 2/26. back on TTS schedule HTN/ volume - BP's acceptable, is below dry wt w/ sig resp distress initially, suspected vol overload. Has lost body wt. 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 Follow. 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 Cont po vdra.   OP HD: TTS G-O 3h 78mn  400/700   72.5kg  2/2.5 bath  LUA AVF  Hep 2000 - last HD 2/22, post wt 72.6kg   - rocaltrol 0.50 mcg po tiw - venofer 50 mg weekly IV - mircera 120 mcg IV q 4 wks, last 2/15, due 2/29  Subjective:   Patient seen and examined bedside. Patient reports that his breathing is about the same but better as compared to admit. Tolerated HD yesterday with net UF 2.1L   Objective:   BP 116/67 (BP Location: Right Arm)   Pulse (!) 101   Temp 98 F (36.7 C) (Oral)   Resp 19   Ht '5\' 3"'$  (1.6 m)   Wt 51.4 kg   SpO2 97%   BMI 20.07 kg/m   Intake/Output Summary (Last 24 hours) at 09/28/2022 1031 Last data filed at 09/28/2022 0757 Gross per 24 hour  Intake 417 ml  Output 2100 ml  Net -1683 ml   Weight change: -2.3 kg  Physical Exam: Gen: NAD CVS: RRR Resp: diminished air entry bibasilar, normal WOB Abd: soft Ext: left bka, no edema Neuro: awake, alert Dialysis access: LUE AVF +b/t  Imaging: DG CHEST PORT 1 VIEW  Result Date: 09/27/2022 CLINICAL DATA:  Tachypnea. EXAM: PORTABLE CHEST 1 VIEW COMPARISON:  Chest x-ray dated September 24, 2022. FINDINGS: Stable cardiomegaly. Continued small bilateral pleural effusions and bibasilar atelectasis. Improving bilateral parahilar opacities. No pneumothorax. No acute osseous abnormality. IMPRESSION: 1.  Improving pulmonary edema. Unchanged small bilateral pleural effusions. Electronically Signed   By: WTitus DubinM.D.   On: 09/27/2022 14:46    Labs: BMET Recent Labs  Lab 09/22/22 1633 09/23/22 0126 09/23/22 1238 09/24/22 0109 09/25/22 0040 09/26/22 0045 09/27/22 0035 09/28/22 0047  NA 135 137  --  129* 133* 129* 132* 135  K 3.5 3.9  --  3.8 4.4 4.1 4.3 4.1  CL 90* 93*  --  88* 92* 89* 90* 88*  CO2 25 20*  --  20* 21* 22 21* 28  GLUCOSE 125* 173*  --  135* 132* 187* 246* 386*  BUN 16 28*  --  53* 34* 60* 51* 63*  CREATININE 3.06* 4.51*  --  6.12* 4.80* 6.48* 5.40* 4.69*  CALCIUM 8.5* 7.9*  --  7.5* 8.1* 7.7* 8.4* 8.1*  PHOS  --   --  5.0* 5.6* 4.5 4.6 4.6  --    CBC Recent Labs  Lab 09/22/22 1633 09/23/22 0126 09/24/22 0109 09/25/22 0040 09/26/22 0045 09/27/22 0035 09/28/22 0047  WBC 17.2* 13.3*   < > 12.2* 10.6* 9.7 14.0*  NEUTROABS 15.3* 12.0*  --   --   --   --   --   HGB 9.9* 8.5*   < > 9.1* 8.6* 8.8* 9.6*  HCT 30.9* 26.2*   < >  27.8* 26.7* 27.9* 28.8*  MCV 100.3* 100.8*   < > 98.6 98.5 98.6 96.0  PLT 223 180   < > 185 175 171 220   < > = values in this interval not displayed.    Medications:     apixaban  10 mg Oral BID   Followed by   Derrill Memo ON 09/30/2022] apixaban  5 mg Oral BID   aspirin EC  81 mg Oral Daily   atorvastatin  20 mg Oral Daily   calcitRIOL  0.5 mcg Oral Q T,Th,Sa-HD   Chlorhexidine Gluconate Cloth  6 each Topical Q0600   [START ON 09/29/2022] darbepoetin (ARANESP) injection - DIALYSIS  150 mcg Subcutaneous Q Thu-1800   insulin aspart  0-6 Units Subcutaneous TID WC   insulin glargine-yfgn  8 Units Subcutaneous Once   losartan  50 mg Oral Daily   metoprolol succinate  25 mg Oral Daily   sevelamer carbonate  800 mg Oral TID WC      Gean Quint, MD [facility] 09/28/2022, 10:31 AM

## 2022-09-28 NOTE — Progress Notes (Signed)
FMTS Interim Progress Note  S: Patient assessed at bedside, laying in bed comfortably. Denies any questions or concerns. States he has been breathing fine on the HFNC.   O: BP 118/67 (BP Location: Right Arm)   Pulse 91   Temp 98.6 F (37 C) (Oral)   Resp 19   Ht '5\' 3"'$  (1.6 m)   Wt 51.4 kg   SpO2 98%   BMI 20.07 kg/m    General: Alert. NAD. Poor hygiene HEENT: Normocephalic. No rhinorrhea or congestion CV: RRR without murmur Pulm: Normal WOB on 4L. No wheezing. Mild coarse lung sounds Ext: Well perfused. Cap refill < 3 seconds Skin: Warm, dry. Diffuse dry, scaly skin  A/P: PNA Breathing comfortably on 4L HFNC. -Wean oxygen as tolerated -Continue Cefepime  T2DM BGL 200-300s throughout the day. Received 8U LAI. Pending repeat CBG this evening. -Continue vsSSI -PM CBG pending, consider qhs coverage as needed  Remainder of plan per day team.  Colletta Maryland, MD 09/28/2022, 8:40 PM PGY-1, [facility] Service pager [phone]

## 2022-09-28 NOTE — Progress Notes (Signed)
Patient transitioned back to 5L HFNC oxygen sat is 94%

## 2022-09-28 NOTE — Progress Notes (Signed)
   09/28/22 0250  Vitals  BP (!) 118/94  MAP (mmHg) 104  BP Location Right Arm  BP Method Automatic  Patient Position (if appropriate) Lying  Pulse Rate 95  ECG Heart Rate 95  Resp (!) 22  Level of Consciousness  Level of Consciousness Alert  MEWS COLOR  MEWS Score Color Green  Oxygen Therapy  SpO2 97 %  O2 Device Bi-PAP  MEWS Score  MEWS Temp 0  MEWS Systolic 0  MEWS Pulse 0  MEWS RR 1  MEWS LOC 0  MEWS Score 1   Pt became diaphoretic, oxygen saturation dropped to lower 80's on 9L HFNC. RT made aware and switched oxygen to Bipap. Pt also vomited. Dr. Jerilee Hoh notified and ordered 4 mg of zofran IV.

## 2022-09-28 NOTE — Progress Notes (Signed)
Pharmacy Antibiotic Note  [name] is a 67 y.o. male for who has been admitted with pneumonia. Patient with a history of ESRD on HD TTS. Patient presenting with persistent productive cough. Pharmacy consulted to dose cefepime. Today is abx d#7 of anticipated 10 day course.  Pt has been slow to improve on abx and thus longer abx duration planned.  Also completed 3d steroids on 2/28.  Patient on BiPAP > 6L HFNC.   Plan: Continue cefepime 1g IV q24h.  Dose appropriate for ESRD. Do not anticipate any further dose adjustments needed. Rx will continue to follow peripherally.  Height: '5\' 3"'$  (160 cm) Weight: 51.4 kg (113 lb 5.1 oz) IBW/kg (Calculated) : 56.9  Temp (24hrs), Avg:98 F (36.7 C), Min:97 F (36.1 C), Max:98.5 F (36.9 C)  Recent Labs  Lab 09/22/22 1633 09/22/22 2241 09/23/22 0126 09/24/22 0109 09/25/22 0040 09/26/22 0045 09/27/22 0035 09/28/22 0047  WBC 17.2*  --    < > 10.2 12.2* 10.6* 9.7 14.0*  CREATININE 3.06*  --    < > 6.12* 4.80* 6.48* 5.40* 4.69*  LATICACIDVEN 2.3* 1.6  --   --   --   --   --   --    < > = values in this interval not displayed.     Estimated Creatinine Clearance: 11.6 mL/min (A) (by C-G formula based on SCr of 4.69 mg/dL (H)).    Allergies  Allergen Reactions   Tramadol Nausea And Vomiting    Pt states he took this on an empty stomach one time and it came right back up. He's not had it since.    Microbiology results: 2/22 Resp Panel: neg  2/22 MRSA PCR: neg  2/22 Bcx: NGTD x3    Thank you for allowing pharmacy to be a part of this patient's care.  [employer], Pharm.D., BCPS Clinical Pharmacist Clinical phone for 09/28/2022 from 7:30-3:00 is [phone].  **Pharmacist phone directory can be found on [email or web address] listed under [place].  09/28/2022 8:59 AM

## 2022-09-28 NOTE — Progress Notes (Signed)
Pt receives out-pt HD at [facility] on TTS. Will assist as needed.   Melven Sartorius Renal Navigator [phone]

## 2022-09-28 NOTE — Progress Notes (Signed)
Daily Progress Note Intern Pager: [phone]  Patient name: [name] Medical record number: [id number] Date of birth: September 08, 1955 Age: 67 y.o. Gender: male  Primary Care Provider: Patient, No Pcp Per Consultants: nephrology, cardiology, pulm Code Status: Full Code  Pt Overview and Major Events to Date:  2/22 - admitted    Assessment and Plan: [name] is a 67 y.o. male presenting with cough and SOB.  Past medical history includes ESRD on HD T/TH/Sat, heart failure, well-controlled T2DM, PAD, unilateral BKA, hyperparathyroidism, hypertension.  Pneumonia On admission patient meeting 3/4 SIRS criteria. WBC 10.6 > 9.7. MRSA swab negative. CTA PE negative for PE, did demonstrate multifocal PNA w/ bilat pleural effusions. Xray 2/27 showed improving pulmonary edema. Unchanged small bilateral pleural effusions. Now on 5L HFNC. Improvement in work of breathing, but still diminished breath sounds on auscultation. D/c BiPAP per pulm. S/p 3 day course of solumedrol - pulm consulted, appreciate recs - RT eval and treat - Monitor resp status - SPO2 > 92% - continue cefepime -10/02/22 - Incentive spirometry x10 per hour  ESRD on dialysis ([place]) ESRD on HD T/Th/Sat. Na 135. Cr 4.69 - nephrology following, appreciate recs  Heart failure, unspecified ([place]) On admission BNP 3189. Echo (2/23) showed severe hypokinesis in LV, concerning for LAD disease vs takostubo, EF 30-35% (previously 50-55% on echo in 2021), LV RWMA, G2DD. Appears euvolemic. Weight stable at 72.1 kg. Cards recommending volume management w/ HD. Per pulm can benefit from GDMT and started on losartan - continue on losartan  -- AM CMP - Strict I's and O's - daily weights  DVT (deep venous thrombosis) (HCC) Patient noted to have age indeterminate DVT in right popliteal vein on [place] yesterday, following recommendation from CTPE study. S/p heparin gtt for NSTEMI, for 48 hours. - continue apixaban 10 mg PO BID x 5 days; then 5 mg PO  BID   NSTEMI (non-ST elevated myocardial infarction) ([place]) S/p heparin gtt for 48 hrs (2/23-2/25). Also wanting to start GDMT - not candidate for heart cath. Patient not candidate for Entresto/ACE. -Cardiology following, appreciate recs - continue apixaban 10 mg PO BID x 5 days; then 5 mg PO BID  -metoprolol 25 mg  Type 2 diabetes mellitus, controlled (HCC) A1c of 5.6 this admission. Glucose 302 today. - CBG 4 times daily before meals and bedtime - given 1 dose semglee 8 units - on vsSSI  Other chronic conditions: HLD - atorvastatin 20 mg   FEN/GI: Renal Diet, fluid restriction 1200 ml PPx: apixaban 10 mg PO BID x 5 days; then 5 mg PO BID  Dispo: pending medical improvement  Subjective:  Patient says he feels "better." Not working as hard to breathe and not coughing anymore. He does not endorse any other somatic complaints.  Objective: Temp:  [97 F (36.1 C)-98.5 F (36.9 C)] 98 F (36.7 C) (02/28 0757) Pulse Rate:  [94-113] 101 (02/28 0757) Resp:  [16-46] 19 (02/28 0757) BP: (78-131)/(50-94) 116/67 (02/28 0757) SpO2:  [94 %-100 %] 97 % (02/28 0757) FiO2 (%):  [45 %] 45 % (02/28 0238) Weight:  [51.4 kg-68.9 kg] 51.4 kg (02/28 0839) Physical Exam: General: in no acute distress HEENT: normocephalic and atraumatic Respiratory: on 5 HFNC, diminished breath sounds bilaterally Extremities: moving all extremities spontaneously Gastrointestinal: non-tender and non-distended Cardiovascular: tachycardic  Laboratory: Most recent CBC Lab Results  Component Value Date   WBC 14.0 (H) 09/28/2022   HGB 9.6 (L) 09/28/2022   HCT 28.8 (L) 09/28/2022   MCV 96.0 09/28/2022  PLT 220 09/28/2022   Most recent BMP    Latest Ref Rng & Units 09/28/2022   12:47 AM  BMP  Glucose 70 - 99 mg/dL 386   BUN 8 - 23 mg/dL 63   Creatinine 0.61 - 1.24 mg/dL 4.69   Sodium 135 - 145 mmol/L 135   Potassium 3.5 - 5.1 mmol/L 4.1   Chloride 98 - 111 mmol/L 88   CO2 22 - 32 mmol/L 28   Calcium  8.9 - 10.3 mg/dL 8.1     DG CHEST PORT 1 VIEW (09/27/2022)  IMPRESSION: 1. Improving pulmonary edema. Unchanged small bilateral pleural effusions.  Camelia Phenes, MD 09/28/2022, 11:08 AM  PGY-1, [place] Intern pager: [phone], text pages welcome Secure chat group [place]

## 2022-09-29 ENCOUNTER — Inpatient Hospital Stay (HOSPITAL_COMMUNITY): Payer: Medicare Other

## 2022-09-29 DIAGNOSIS — I824Z9 Acute embolism and thrombosis of unspecified deep veins of unspecified distal lower extremity: Secondary | ICD-10-CM | POA: Diagnosis not present

## 2022-09-29 DIAGNOSIS — I959 Hypotension, unspecified: Secondary | ICD-10-CM

## 2022-09-29 DIAGNOSIS — I4891 Unspecified atrial fibrillation: Secondary | ICD-10-CM | POA: Insufficient documentation

## 2022-09-29 DIAGNOSIS — J189 Pneumonia, unspecified organism: Secondary | ICD-10-CM | POA: Diagnosis not present

## 2022-09-29 DIAGNOSIS — I480 Paroxysmal atrial fibrillation: Secondary | ICD-10-CM | POA: Insufficient documentation

## 2022-09-29 DIAGNOSIS — I5021 Acute systolic (congestive) heart failure: Secondary | ICD-10-CM | POA: Diagnosis not present

## 2022-09-29 DIAGNOSIS — J159 Unspecified bacterial pneumonia: Secondary | ICD-10-CM | POA: Diagnosis not present

## 2022-09-29 DIAGNOSIS — J9601 Acute respiratory failure with hypoxia: Secondary | ICD-10-CM | POA: Diagnosis not present

## 2022-09-29 LAB — CBC
HCT: 26.5 % — ABNORMAL LOW (ref 39.0–52.0)
Hemoglobin: 8.7 g/dL — ABNORMAL LOW (ref 13.0–17.0)
MCH: 31.9 pg (ref 26.0–34.0)
MCHC: 32.8 g/dL (ref 30.0–36.0)
MCV: 97.1 fL (ref 80.0–100.0)
Platelets: 203 10*3/uL (ref 150–400)
RBC: 2.73 MIL/uL — ABNORMAL LOW (ref 4.22–5.81)
RDW: 15.4 % (ref 11.5–15.5)
WBC: 11.5 10*3/uL — ABNORMAL HIGH (ref 4.0–10.5)
nRBC: 0 % (ref 0.0–0.2)

## 2022-09-29 LAB — TROPONIN I (HIGH SENSITIVITY)
Troponin I (High Sensitivity): 1001 ng/L (ref <18)
Troponin I (High Sensitivity): 1048 ng/L (ref <18)

## 2022-09-29 LAB — CULTURE, BLOOD (ROUTINE X 2): Special Requests: ADEQUATE

## 2022-09-29 LAB — BASIC METABOLIC PANEL
Anion gap: 20 — ABNORMAL HIGH (ref 5–15)
BUN: 101 mg/dL — ABNORMAL HIGH (ref 8–23)
CO2: 25 mmol/L (ref 22–32)
Calcium: 8.1 mg/dL — ABNORMAL LOW (ref 8.9–10.3)
Chloride: 90 mmol/L — ABNORMAL LOW (ref 98–111)
Creatinine, Ser: 6.86 mg/dL — ABNORMAL HIGH (ref 0.61–1.24)
GFR, Estimated: 8 mL/min — ABNORMAL LOW (ref >60)
Glucose, Bld: 328 mg/dL — ABNORMAL HIGH (ref 70–99)
Potassium: 4.7 mmol/L (ref 3.5–5.1)
Sodium: 135 mmol/L (ref 135–145)

## 2022-09-29 LAB — GLUCOSE, CAPILLARY
Glucose-Capillary: 188 mg/dL — ABNORMAL HIGH (ref 70–99)
Glucose-Capillary: 203 mg/dL — ABNORMAL HIGH (ref 70–99)
Glucose-Capillary: 209 mg/dL — ABNORMAL HIGH (ref 70–99)
Glucose-Capillary: 221 mg/dL — ABNORMAL HIGH (ref 70–99)
Glucose-Capillary: 262 mg/dL — ABNORMAL HIGH (ref 70–99)
Glucose-Capillary: 312 mg/dL — ABNORMAL HIGH (ref 70–99)

## 2022-09-29 MED ORDER — ALBUMIN HUMAN 25 % IV SOLN: 12.5000 g | Freq: Once | INTRAVENOUS | Status: DC

## 2022-09-29 MED ORDER — INSULIN ASPART 100 UNIT/ML IJ SOLN
5.0000 [IU] | Freq: Once | INTRAMUSCULAR | Status: AC
  Administered 2022-09-29: 5 [IU] via SUBCUTANEOUS

## 2022-09-29 MED ORDER — ALBUMIN HUMAN 25 % IV SOLN
25.0000 g | Freq: Once | INTRAVENOUS | Status: AC
  Administered 2022-09-29: 25 g via INTRAVENOUS

## 2022-09-29 MED ORDER — AMIODARONE LOAD VIA INFUSION
150.0000 mg | Freq: Once | INTRAVENOUS | Status: AC
  Administered 2022-09-29: 150 mg via INTRAVENOUS
  Filled 2022-09-29: qty 83.34

## 2022-09-29 MED ORDER — AMIODARONE HCL IN DEXTROSE 360-4.14 MG/200ML-% IV SOLN
60.0000 mg/h | INTRAVENOUS | Status: AC
  Administered 2022-09-29 (×2): 60 mg/h via INTRAVENOUS
  Filled 2022-09-29: qty 200

## 2022-09-29 MED ORDER — AMIODARONE HCL IN DEXTROSE 360-4.14 MG/200ML-% IV SOLN
30.0000 mg/h | INTRAVENOUS | Status: DC
  Administered 2022-09-30 – 2022-10-01 (×3): 30 mg/h via INTRAVENOUS
  Filled 2022-09-29 (×5): qty 200

## 2022-09-29 MED ORDER — SODIUM CHLORIDE 0.9 % IV SOLN: Freq: Once | INTRAVENOUS | Status: AC

## 2022-09-29 MED ORDER — SODIUM CHLORIDE 0.9 % IV BOLUS
250.0000 mL | Freq: Once | INTRAVENOUS | Status: AC
  Administered 2022-09-29: 250 mL via INTRAVENOUS

## 2022-09-29 MED ORDER — HEPARIN SODIUM (PORCINE) 1000 UNIT/ML IJ SOLN
INTRAMUSCULAR | Status: AC
  Administered 2022-09-29: 2000 [IU]
  Filled 2022-09-29: qty 2

## 2022-09-29 NOTE — Progress Notes (Signed)
Rounding Note    Patient Name: [name] Date of Encounter: 09/29/2022  [place] Cardiologist: Elouise Munroe, MD   Subjective   States he feels okay. No chest pain, SOB, or abdominal pain.  Developed Afib with RVR today. HR currently 130s. BP soft in 80s.  Inpatient Medications    Scheduled Meds:  amiodarone  150 mg Intravenous Once   apixaban  10 mg Oral BID   Followed by   Derrill Memo ON 09/30/2022] apixaban  5 mg Oral BID   aspirin EC  81 mg Oral Daily   atorvastatin  20 mg Oral Daily   calcitRIOL  0.5 mcg Oral Q T,Th,Sa-HD   Chlorhexidine Gluconate Cloth  6 each Topical Q0600   darbepoetin (ARANESP) injection - DIALYSIS  150 mcg Subcutaneous Q Thu-1800   insulin aspart  0-6 Units Subcutaneous TID WC   sevelamer carbonate  800 mg Oral TID WC   Continuous Infusions:  amiodarone     Followed by   amiodarone     ceFEPime (MAXIPIME) IV 1 g (09/28/22 2054)   PRN Meds:    Vital Signs    Vitals:   09/29/22 1415 09/29/22 1427 09/29/22 1430 09/29/22 1500  BP: (!) 66/47 (!) 80/52 (!) 78/61 (!) 97/51  Pulse: (!) 111 74 (!) 122 (!) 110  Resp:  16  20  Temp:    98.5 F (36.9 C)  TempSrc:    Oral  SpO2: 97% 97% 97% 96%  Weight:      Height:        Intake/Output Summary (Last 24 hours) at 09/29/2022 1526 Last data filed at 09/29/2022 1210 Gross per 24 hour  Intake 360 ml  Output 1900 ml  Net -1540 ml       09/29/2022   12:54 PM 09/29/2022   12:20 PM 09/29/2022    8:05 AM  Last 3 Weights  Weight (lbs) 138 lb 3.7 oz 142 lb 3.2 oz 153 lb 7 oz  Weight (kg) 62.7 kg 64.5 kg 69.6 kg      Telemetry    Afib with RVR - Personally Reviewed  ECG    Afib with RVR, possible inferior infarct - Personally Reviewed  Physical Exam   GEN: Tachypneic with some abdominal breathing, chronically ill appearing   Neck: No JVD Cardiac: Irregularly irregular, 2/6 systolic murmur Respiratory: Tachpneic with some accessory muscle use. End expiratory wheezes GI:  Distended, soft, NTTP MS: Left BKA. No edema on right Neuro:  Nonfocal  Psych: Normal affect   Labs    High Sensitivity Troponin:   Recent Labs  Lab 09/23/22 1238 09/23/22 1424  TROPONINIHS 5,494* 6,320*      Chemistry Recent Labs  Lab 09/22/22 1633 09/23/22 0120 09/23/22 0126 09/24/22 0109 09/25/22 0040 09/26/22 0045 09/27/22 0035 09/28/22 0047 09/29/22 0030  NA 135  --  137   < > 133* 129* 132* 135 135  K 3.5  --  3.9   < > 4.4 4.1 4.3 4.1 4.7  CL 90*  --  93*   < > 92* 89* 90* 88* 90*  CO2 25  --  20*   < > 21* 22 21* 28 25  GLUCOSE 125*  --  173*   < > 132* 187* 246* 386* 328*  BUN 16  --  28*   < > 34* 60* 51* 63* 101*  CREATININE 3.06*  --  4.51*   < > 4.80* 6.48* 5.40* 4.69* 6.86*  CALCIUM 8.5*  --  7.9*   < > 8.1* 7.7* 8.4* 8.1* 8.1*  MG  --  2.1  --   --   --   --   --   --   --   PROT 7.4  --  6.2*  --   --   --   --   --   --   ALBUMIN 3.2*  --  2.5*   < > 2.3* 2.1* 2.2*  --   --   AST 35  --  26  --   --   --   --   --   --   ALT 19  --  16  --   --   --   --   --   --   ALKPHOS 62  --  51  --   --   --   --   --   --   BILITOT 1.4*  --  1.8*  --   --   --   --   --   --   GFRNONAA 22*  --  14*   < > 13* 9* 11* 13* 8*  ANIONGAP 20*  --  24*   < > 20* 18* 21* 19* 20*   < > = values in this interval not displayed.     Lipids No results for input(s): "CHOL", "TRIG", "HDL", "LABVLDL", "LDLCALC", "CHOLHDL" in the last 168 hours.  Hematology Recent Labs  Lab 09/27/22 0035 09/28/22 0047 09/29/22 0030  WBC 9.7 14.0* 11.5*  RBC 2.83* 3.00* 2.73*  HGB 8.8* 9.6* 8.7*  HCT 27.9* 28.8* 26.5*  MCV 98.6 96.0 97.1  MCH 31.1 32.0 31.9  MCHC 31.5 33.3 32.8  RDW 15.6* 15.4 15.4  PLT 171 220 203    Thyroid No results for input(s): "TSH", "FREET4" in the last 168 hours.  BNP Recent Labs  Lab 09/22/22 1633 09/27/22 0035  BNP 3,189.2* 3,505.9*     DDimer No results for input(s): "DDIMER" in the last 168 hours.   Radiology    DG CHEST PORT 1  VIEW  Result Date: 09/29/2022 CLINICAL DATA:  Respiration abnormal EXAM: PORTABLE CHEST 1 VIEW COMPARISON:  CXR 09/27/22 FINDINGS: Redemonstrated small bilateral pleural effusions. Unchanged cardiac and mediastinal contours. No pneumothorax. Compared to prior exam there is interval increase in bilateral interstitial opacities as well as possible interval development of a new right basilar pulmonary opacity. Radiographically apparent displaced rib fractures. Visualized upper abdomen is notable for marked gastric gaseous distention. IMPRESSION: 1. Interval increase in bilateral interstitial opacities as well as possible interval development of a new right basilar pulmonary opacity. Findings may worsening pulmonary edema with a superimposed infection of the right lung base. 2. Gastric gaseous distention. Electronically Signed   By: Marin Roberts M.D.   On: 09/29/2022 08:12    Cardiac Studies   TTE 09/23/22: IMPRESSIONS     1. There is severe hypokinesis of the mid-to-apical septal, mid-to-apical  anterior, and mid-to-apical inferior LV segments and apex. No LV thrombus  visualized. Findings concerning for possible LAD disease vs takostubo  cardiomyopathy. Left ventricular  ejection fraction, by estimation, is 30 to 35%. The left ventricle has  moderately decreased function. The left ventricle demonstrates regional  wall motion abnormalities (see scoring diagram/findings for description).  Left ventricular diastolic parameters   are consistent with Grade II diastolic dysfunction (pseudonormalization).  The E/e' is 17.2.   2. Right ventricular systolic function is mildly reduced. The right  ventricular size is normal.  3. Left atrial size was mildly dilated.   4. The mitral valve is grossly normal. Mild mitral valve regurgitation.   5. The aortic valve is tricuspid. Aortic valve regurgitation is not  visualized. Aortic valve sclerosis is present, with no evidence of aortic  valve stenosis.   6.  The inferior vena cava is dilated in size with <50% respiratory  variability, suggesting right atrial pressure of 15 mmHg.   Comparison(s): Compared to prior TTE in 2021, the EF has dropped from  50-55% to 30-35% with WMA as detailed above.   Patient Profile     67 y.o. male with ESRD on HD with prior BKA, PAD, DMII, HTN who was admitted with sepsis secondary to pneumonia with course complicated by newly diagnosed systolic HF and new AFib for which Cardiology was consulted.   Assessment & Plan    #Newly Diagnosed Afib with RVR: CHADs-vasc 4. Newly diagnosed on this admission. Currently with HR 130s in RVR with soft blood pressures. Will start amiodarone IV and continue apixaban for AC. -Continue IV amiodarone -Continue apixaban (already on for DVT) -Will give additional 250cc IVF for hypotension  #Abdominal Distension: KUB briefly reviewed with concern for ileus. Patient not passing gas and had some vomiting. Discussed with primary team.  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 Prior cath in 2021 with diffuse disease (70% mid-LAD, 100% prox Lcx, 90% distal LAD). He is currently on HD for volume management. GDMT limited due to ESRD and hypotension. Will plan for ischemic work-up as outpatient if EF remains depressed -Cannot tolerate metop due to hypotension -No plan for cath this admission given acute illness, lack of chest pain, and underlying significant comorbidities; can reassess EF as outpatient and consider cath if remains depressed.   #CAD Cath in 2021 with 75% mLAD, 100% prox-mid Lcz, 90% distal LAD. Was recommended for medical management at that time. Suspect trop rise on admission secondary to demand in the setting of sepsis and newly diagnosed systolic HF. Will not plan for cath on this admission given lack of anginal  symptoms and sepsis secondary to pneumonia. Plan for continued medical management and can consider ischemic work-up as outpatient if EF remains depressed on TTE despite clinical improvement. -May need to hold PO meds given possible ileus as above -Will plan for repeat TTE as outpatient to reassess EF and if persistently depressed, plan for ischemic evaluation at that time  #Sepsis Secondary to Pneumonia: -Management per primary  #HTN: Now hypotensive. Holding meds.  #ESRD on HD: -Management per nephrology      For questions or updates, please contact [place] Please consult [email or web address] for contact info under        Signed, Freada Bergeron, MD  09/29/2022, 3:26 PM

## 2022-09-29 NOTE — Progress Notes (Addendum)
FMTS Interim Progress Note  S: Paged by nursing staff the patient was hypotensive after dialysis with some nausea.  Went to evaluate patient at bedside with Dr. Jinny Sanders.  Patient endorses is nausea and not currently vomiting.  Had episode of vomiting in HD. When asked about his breathing he replies " not great" but does not explain further.  O: BP 92/68 (BP Location: Right Arm)   Pulse (!) 142   Temp 98.3 F (36.8 C) (Oral)   Resp 20   Ht '5\' 3"'$  (1.6 m)   Wt 62.7 kg   SpO2 93%   BMI 24.49 kg/m    General: Chronically ill-appearing, uncomfortable but NAD Cardio: Tachycardic, no murmurs Respiratory: Normal work of breathing on 5L HFNC.  Coarse breath sounds bilaterally. Extremities: Left extremity cool with cap refill ~3s.  1/4 radial pulse of left hand.  Right radial pulse 2/4.  Legs warm and dry, no edema (right TMA, left BKA) Skin: Diaphoretic, warm  A/P: Hypotension Received 25 g of albumin during dialysis for low blood pressure.  Blood pressure of 60/40 at time of [place], increased to 90/60 during examination.  Tachycardic between 110-130.  Nausea with abdominal pain.  Normal work of breathing on 5L HFNC with SpO2 saturations >92%.  Of concern patient had NSTEMI which was treated with 48 hours of heparin during this admission.  Spoke with on-call nephrology provider to discuss plan.  Will obtain EKG for arrhythmia/ischemic changes, obtain troponin levels.  Hold blood pressure medication, will bolus fluids-per nephrology.  Will obtain KUB to workup nausea with stomach distention. -hold losartan and metop -EKG -Trops -250 ml bolus of NS per nephrology -KUB (stomach distension w/ nausea)  Leslie Dales, DO 09/29/2022, 1:49 PM PGY-1, [place] Medicine Service pager [phone]

## 2022-09-29 NOTE — Inpatient Diabetes Management (Signed)
Inpatient Diabetes Program Recommendations  AACE/ADA: New Consensus Statement on Inpatient Glycemic Control (2015)  Target Ranges:  Prepandial:   less than 140 mg/dL      Peak postprandial:   less than 180 mg/dL (1-2 hours)      Critically ill patients:  140 - 180 mg/dL   Lab Results  Component Value Date   GLUCAP 221 (H) 09/29/2022   HGBA1C 5.6 09/23/2022    Latest Reference Range & Units 09/28/22 06:20 09/28/22 11:29 09/28/22 16:09 09/28/22 20:59 09/29/22 00:05 09/29/22 02:26 09/29/22 06:31  Glucose-Capillary 70 - 99 mg/dL 223 (H) 291 (H) 304 (H) 276 (H) 312 (H) 262 (H) 221 (H)  (H): Data is abnormally high Review of Glycemic Control  Diabetes history: type 2 Outpatient Diabetes medications: none Current orders for Inpatient glycemic control: Novolog 0-6 units TID with meals  Inpatient Diabetes Program Recommendations:   Noted that patient's blood sugars have been greater than 200 mg/dl. Had received one dose of Semglee 8 units yesterday.   Recommend starting Semglee 10 units daily if blood sugars continue to be greater than 180 mg/dl.   Harvel Ricks RN BSN CDE Diabetes Coordinator Pager: [phone]  8am-5pm

## 2022-09-29 NOTE — Progress Notes (Signed)
Daily Progress Note Intern Pager: [phone]  Patient name: [name] Medical record number: [id number] Date of birth: 1956-05-14 Age: 67 y.o. Gender: male  Primary Care Provider: Patient, No Pcp Per Consultants: nephrology, cardiology, pulm Code Status: Full Code  Pt Overview and Major Events to Date:  2/22 - admitted   Assessment and Plan: [name] is a 67 y.o. male presenting with cough and SOB.  Past medical history includes ESRD on HD T/TH/Sat, heart failure, well-controlled T2DM, PAD, unilateral BKA, hyperparathyroidism, hypertension.  Pneumonia On admission patient meeting 3/4 SIRS criteria. WBC 10.6 > 9.7 > 11.5. MRSA swab negative. CTA PE negative for PE, did demonstrate multifocal PNA w/ bilat pleural effusions. Xray 2/29 showed interval increase in bilateral interstitial opacities as well as possible interval development of a new right basilar pulmonary opacity. Findings may worsening pulmonary edema with a superimposed infection of the right lung base.. Now on room air satting 99%. Improvement in work of breathing, but still diminished breath sounds on auscultation. D/c BiPAP per pulm. S/p 3 day course of solumedrol - pulm signed off - RT eval and treat - Monitor resp status - SPO2 > 92% - continue cefepime -10/02/22 - Incentive spirometry  ESRD on dialysis ([place]) ESRD on HD T/Th/Sat. Na 135. Cr 4.69 - nephrology following, appreciate recs  Heart failure, unspecified ([place]) On admission BNP 3189. Echo (2/23) showed severe hypokinesis in LV, concerning for LAD disease vs takostubo, EF 30-35% (previously 50-55% on echo in 2021), LV RWMA, G2DD. Appears euvolemic. Weight stable at 72.1 kg. Cards recommending volume management w/ HD. Per pulm can benefit from GDMT and started on losartan - continue on losartan  -- AM CMP - Strict I's and O's - daily weights  DVT (deep venous thrombosis) (HCC) Patient noted to have age indeterminate DVT in right popliteal vein on [place]  yesterday, following recommendation from CTPE study. S/p heparin gtt for NSTEMI, for 48 hours. - continue apixaban 10 mg PO BID x 5 days; then 5 mg PO BID   NSTEMI (non-ST elevated myocardial infarction) ([place]) S/p heparin gtt for 48 hrs (2/23-2/25). Also wanting to start GDMT - not candidate for heart cath. Patient not candidate for Entresto/ACE. -Cardiology following, appreciate recs - continue apixaban 10 mg PO BID x 5 days; then 5 mg PO BID  -metoprolol 25 mg  Type 2 diabetes mellitus, controlled (HCC) A1c of 5.6 this admission. Glucose 200s - 300s. - CBG 4 times daily before meals and bedtime - given 5 units and 3 units novolog this AM - on vsSSI  Other chronic conditions: HLD - atorvastatin 20 mg   FEN/GI: Renal Diet, fluid restriction 1200 ml PPx: apixaban 10 mg PO BID x 5 days; then 5 mg PO BID  Dispo: pending medical improvement  Subjective:  Patient feels well. Denies shortness of breath. Does not endorse any other somatic comlaints.  Objective: Temp:  [98 F (36.7 C)-98.9 F (37.2 C)] 98.9 F (37.2 C) (02/29 0805) Pulse Rate:  [81-102] 102 (02/29 1200) Resp:  [17-37] 21 (02/29 1200) BP: (93-121)/(58-71) (P) 124/90 (02/29 1200) SpO2:  [98 %-100 %] 99 % (02/29 1200) Weight:  [65.2 kg-69.6 kg] 69.6 kg (02/29 0805) Physical Exam: General: in no acute distress and chronically ill-appearing HEENT: normocephalic and atraumatic Respiratory: on 3 L nasal cannula and tachypneic, end-expiratory wheezes Extremities: moving all extremities spontaneously Gastrointestinal: non-tender and non-distended Cardiovascular: regular rate  Laboratory: Most recent CBC Lab Results  Component Value Date   WBC 11.5 (H) 09/29/2022  HGB 8.7 (L) 09/29/2022   HCT 26.5 (L) 09/29/2022   MCV 97.1 09/29/2022   PLT 203 09/29/2022   Most recent BMP    Latest Ref Rng & Units 09/29/2022   12:30 AM  BMP  Glucose 70 - 99 mg/dL 328   BUN 8 - 23 mg/dL 101   Creatinine 0.61 - 1.24 mg/dL 6.86    Sodium 135 - 145 mmol/L 135   Potassium 3.5 - 5.1 mmol/L 4.7   Chloride 98 - 111 mmol/L 90   CO2 22 - 32 mmol/L 25   Calcium 8.9 - 10.3 mg/dL 8.1     Imaging/Diagnostic Tests: CXR (09/29/2022) IMPRESSION: 1. Interval increase in bilateral interstitial opacities as well as possible interval development of a new right basilar pulmonary opacity. Findings may worsening pulmonary edema with a superimposed infection of the right lung base. 2. Gastric gaseous distention.  Camelia Phenes, MD 09/29/2022, 12:39 PM  PGY-1, [place] Intern pager: [phone], text pages welcome Secure chat group [place]

## 2022-09-29 NOTE — Progress Notes (Signed)
Patient back from dialysis pale and diaphoretic. Per HD RN, patient had emesis toward the end of HD session. Patient tachycardic and hypotensive, HR 139, BP trending down from 87/46 to 79/59. MD notified and at bedside. Patient alert and oriented x4. Patient has a wet productive cough, not able to bring up phlegm,but refusing to use the yankeur.  Marcille Blanco, RN

## 2022-09-29 NOTE — Progress Notes (Addendum)
FMTS Interim Progress Note  Spoke with on call cardiology provider Dr. Harrington Challenger > patient's EKG returned with Afib RVR.  Did have significant hypotension after HD session and we have ordered fluid resuscitation per nephrology recommendations.  Discussed that he had newly reduced EF on admission with possible Takotsubo versus LAD disease with regional wall motion abnormality and NSTEMI that was treated with heparin drip for 48 hours.  We have obtained cardiac enzymes and are awaiting this.  Dr. Harrington Challenger will see patient and put in her recommendations. Already on eliquis treatment dose for DVT. Appreciate cardiology care, low threshold to discuss with PCCM if patient does not stabilized.  Gerrit Heck, MD 09/29/2022, 2:07 PM PGY-2, [facility] Medicine Service pager [phone]

## 2022-09-29 NOTE — Progress Notes (Signed)
NG tube placed to right nare without difficulty. Patient tolerated procedure well. MD notified, and KUB ordered by MD to verify placement. Marcille Blanco, RN

## 2022-09-29 NOTE — Significant Event (Signed)
Rapid Response Event Note   Reason for Call :  Hypotension and tachycardia s/p hemodialysis BP 73/37, HR 124  Initial Focused Assessment:  Pt lying in bed, AO. Skin is pale, warm, dry. Pt denies lightheadedness, dizziness or pain. Lung sounds are clear.   Pt history of soft BP with HD treatments. He received 25g Albumin in HD. He had 1900cc fluid removed in HD. He currently has 250 cc IVF bolus infusing.   VS: BP 80/52, HR 131, RR 16, 97% on 5LNC CBG: 203  Interventions:  -New orders from MD: Troponin lab draw, EKG, 250 cc IVF bolus  Plan of Care:  -Frequent monitoring of VS and close monitoring of mentation -Continue current treatment plan  Call rapid response for additional needs  Event Summary:  MD Notified: per RN Call Time: [place] Time: [id number] End Time: [place], RN

## 2022-09-29 NOTE — Progress Notes (Addendum)
Received patient in bed,awake alert and oriented x 4. Vitals is low werre low.Renal MD made aware.  Access used : Left upper arm fistula that worked well.  Duration of treatment : 3.5 hours.  Medicine given: Albumin 25 g.                           Zofran '4mg'$   Fluid removed : Unmet 1,900 cc out of 3,000 cc Uf goal prescribed.  Hemodialysis issue/comment :Blood pressures were dropping every now and then, even with 25 g of Albumin given to him.He vomitted with  large amount of a partially digested food near the end of the treatment, due to sudden dropped of his blood pressure to 68/31,NS 100 cc ,he had a staring blank ,pale looking,100 ns bolus immediately given, his sbp recovered to normal, suction set-up but patient started to spit out  more of his vomitus.  Hand off to the patient's nurse.

## 2022-09-29 NOTE — Progress Notes (Signed)
FMTS Interim Progress Note  S: Patient assessed at bedside with Dr. Madison Hickman. Patient just returned from CT scan. Stable on 6L HFNC and denies shortness of breath or chest pain. States he has not passed gas but otherwise does not have abdominal pain. NG to suction with around 280m dark brown output.  O: BP (!) 104/55 (BP Location: Right Arm)   Pulse 95   Temp 100.2 F (37.9 C) (Oral)   Resp 17   Ht '5\' 3"'$  (1.6 m)   Wt 62.7 kg   SpO2 100%   BMI 24.49 kg/m    General: Sitting up in bed. Alert. NAD CV: RRR without murmur Pulm: Mild belly breathing on 6L HFNC, otherwise comfortable.  Abdomen: Soft, non-tender, mild distension. Hypoactive bowel sounds Ext: No peripheral edema Skin: Warm, dry. No rashes noted   A/P: PNA Appears to be stable on 6L HFNC and denies respiratory concerns. -Wean oxygen as tolerated  Possible ileus Hypoactive bowel sounds and mild distension upon exam. Denies abdominal pain but also has not been passing gas. NPO to suction with ~2573moutput -F/u CT abdomen/pelvis -Continue NG to low intermittent suction  -Zofran '4mg'$  q8h prn  Hypotension/Tachycardia Occurred post-dialysis, likely in the setting of volume depletion. S/p 25051molus. Vitals now stable. -Monitor vitals closely  Afib with RVR Likely trigger by underlying infection. Already on Eliquis for age indeterminate DVT. Seen by Cardiology and placed on Amiodarone gtt. No in NSR with regular rate. -Cards following, appreciate recs -Continue Eliquis '5mg'$  BID  Elevated troponin Stable in 1000s. Likely secondary to demand ischemia per Cardiology. Will not pursue cath during this admission -D/c troponin trending  Remainder of plan per day team.  HerColletta MarylandD 09/29/2022, 10:05 PM PGY-1, [facility] pager [phone]

## 2022-09-29 NOTE — Progress Notes (Signed)
[facility] Progress Note    Assessment/ Plan:   Sepsis / bilat PNA - on IV abx & steroids per pmd Acute systolic HF / NSTEMI - +trop 5500, s/p IV heparin per cardiology. LVEF 30-35% by echo here. Ischemic workup planned for outpatient AHRF - Uf'ing as tolerated ESRD - on HD TTS. S/p extra HD treatment 2/26. back on TTS schedule HTN/ volume - BP's acceptable, is below dry wt w/ sig resp distress initially, suspected vol overload. Has lost body wt. 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 Follow. 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 Cont po vdra.   OP HD: TTS G-O 3h 12mn  400/700   72.5kg  2/2.5 bath  LUA AVF  Hep 2000 - last HD 2/22, post wt 72.6kg   - rocaltrol 0.50 mcg po tiw - venofer 50 mg weekly IV - mircera 120 mcg IV q 4 wks, last 2/15, due 2/29  Subjective:   Patient seen and examined on dialysis, tolerating treatment. Has some lower Bps, may not achieve UF goals today. No complaints   Objective:   BP (!) 99/59   Pulse 90   Temp 98.9 F (37.2 C) (Oral)   Resp (!) 37   Ht '5\' 3"'$  (1.6 m)   Wt 69.6 kg   SpO2 100%   BMI 27.18 kg/m   Intake/Output Summary (Last 24 hours) at 09/29/2022 [id number] data filed at 09/29/2022 [id number] per 24 hour  Intake 480 ml  Output --  Net 480 ml   Weight change: -19.6 kg  Physical Exam: Gen: NAD CVS: RRR Resp: diminished air entry bibasilar, normal WOB Abd: soft Ext: left bka, no edema Neuro: awake, alert Dialysis access: LUE AVF +b/t  Imaging: DG CHEST PORT 1 VIEW  Result Date: 09/29/2022 CLINICAL DATA:  Respiration abnormal EXAM: PORTABLE CHEST 1 VIEW COMPARISON:  CXR 09/27/22 FINDINGS: Redemonstrated small bilateral pleural effusions. Unchanged cardiac and mediastinal contours. No pneumothorax. Compared to prior exam there is interval increase in bilateral interstitial opacities as well as possible interval development of a new right basilar pulmonary  opacity. Radiographically apparent displaced rib fractures. Visualized upper abdomen is notable for marked gastric gaseous distention. IMPRESSION: 1. Interval increase in bilateral interstitial opacities as well as possible interval development of a new right basilar pulmonary opacity. Findings may worsening pulmonary edema with a superimposed infection of the right lung base. 2. Gastric gaseous distention. Electronically Signed   By: HMarin RobertsM.D.   On: 09/29/2022 08:12   DG CHEST PORT 1 VIEW  Result Date: 09/27/2022 CLINICAL DATA:  Tachypnea. EXAM: PORTABLE CHEST 1 VIEW COMPARISON:  Chest x-ray dated September 24, 2022. FINDINGS: Stable cardiomegaly. Continued small bilateral pleural effusions and bibasilar atelectasis. Improving bilateral parahilar opacities. No pneumothorax. No acute osseous abnormality. IMPRESSION: 1. Improving pulmonary edema. Unchanged small bilateral pleural effusions. Electronically Signed   By: WTitus DubinM.D.   On: 09/27/2022 14:46    Labs: BMET Recent Labs  Lab 09/23/22 0126 09/23/22 1238 09/24/22 0109 09/25/22 0040 09/26/22 0045 09/27/22 0035 09/28/22 0047 09/29/22 0030  NA 137  --  129* 133* 129* 132* 135 135  K 3.9  --  3.8 4.4 4.1 4.3 4.1 4.7  CL 93*  --  88* 92* 89* 90* 88* 90*  CO2 20*  --  20* 21* 22 21* 28 25  GLUCOSE 173*  --  135* 132* 187* 246* 386* 328*  BUN 28*  --  53* 34* 60* 51* 63* 101*  CREATININE 4.51*  --  6.12* 4.80* 6.48* 5.40* 4.69* 6.86*  CALCIUM 7.9*  --  7.5* 8.1* 7.7* 8.4* 8.1* 8.1*  PHOS  --  5.0* 5.6* 4.5 4.6 4.6  --   --    CBC Recent Labs  Lab 09/22/22 1633 09/23/22 0126 09/24/22 0109 09/26/22 0045 09/27/22 0035 09/28/22 0047 09/29/22 0030  WBC 17.2* 13.3*   < > 10.6* 9.7 14.0* 11.5*  NEUTROABS 15.3* 12.0*  --   --   --   --   --   HGB 9.9* 8.5*   < > 8.6* 8.8* 9.6* 8.7*  HCT 30.9* 26.2*   < > 26.7* 27.9* 28.8* 26.5*  MCV 100.3* 100.8*   < > 98.5 98.6 96.0 97.1  PLT 223 180   < > 175 171 220 203   < > =  values in this interval not displayed.    Medications:     apixaban  10 mg Oral BID   Followed by   Derrill Memo ON 09/30/2022] apixaban  5 mg Oral BID   aspirin EC  81 mg Oral Daily   atorvastatin  20 mg Oral Daily   calcitRIOL  0.5 mcg Oral Q T,Th,Sa-HD   Chlorhexidine Gluconate Cloth  6 each Topical Q0600   darbepoetin (ARANESP) injection - DIALYSIS  150 mcg Subcutaneous Q Thu-1800   insulin aspart  0-6 Units Subcutaneous TID WC   losartan  50 mg Oral Daily   metoprolol succinate  25 mg Oral Daily   sevelamer carbonate  800 mg Oral TID WC      Gean Quint, MD [facility] 09/29/2022, 9:36 AM

## 2022-09-30 DIAGNOSIS — I5022 Chronic systolic (congestive) heart failure: Secondary | ICD-10-CM

## 2022-09-30 DIAGNOSIS — I5021 Acute systolic (congestive) heart failure: Secondary | ICD-10-CM | POA: Diagnosis not present

## 2022-09-30 DIAGNOSIS — J159 Unspecified bacterial pneumonia: Secondary | ICD-10-CM | POA: Diagnosis not present

## 2022-09-30 DIAGNOSIS — R339 Retention of urine, unspecified: Secondary | ICD-10-CM

## 2022-09-30 DIAGNOSIS — J9601 Acute respiratory failure with hypoxia: Secondary | ICD-10-CM | POA: Diagnosis not present

## 2022-09-30 DIAGNOSIS — J189 Pneumonia, unspecified organism: Secondary | ICD-10-CM | POA: Diagnosis not present

## 2022-09-30 DIAGNOSIS — I5023 Acute on chronic systolic (congestive) heart failure: Secondary | ICD-10-CM

## 2022-09-30 DIAGNOSIS — N186 End stage renal disease: Secondary | ICD-10-CM | POA: Diagnosis not present

## 2022-09-30 DIAGNOSIS — K567 Ileus, unspecified: Secondary | ICD-10-CM | POA: Insufficient documentation

## 2022-09-30 DIAGNOSIS — I824Z9 Acute embolism and thrombosis of unspecified deep veins of unspecified distal lower extremity: Secondary | ICD-10-CM | POA: Diagnosis not present

## 2022-09-30 DIAGNOSIS — K560 Paralytic ileus: Secondary | ICD-10-CM

## 2022-09-30 LAB — GLUCOSE, CAPILLARY
Glucose-Capillary: 194 mg/dL — ABNORMAL HIGH (ref 70–99)
Glucose-Capillary: 186 mg/dL — ABNORMAL HIGH (ref 70–99)
Glucose-Capillary: 189 mg/dL — ABNORMAL HIGH (ref 70–99)
Glucose-Capillary: 167 mg/dL — ABNORMAL HIGH (ref 70–99)

## 2022-09-30 LAB — BASIC METABOLIC PANEL
Anion gap: 20 — ABNORMAL HIGH (ref 5–15)
BUN: 59 mg/dL — ABNORMAL HIGH (ref 8–23)
CO2: 25 mmol/L (ref 22–32)
Calcium: 7.9 mg/dL — ABNORMAL LOW (ref 8.9–10.3)
Chloride: 93 mmol/L — ABNORMAL LOW (ref 98–111)
Creatinine, Ser: 4.58 mg/dL — ABNORMAL HIGH (ref 0.61–1.24)
GFR, Estimated: 14 mL/min — ABNORMAL LOW (ref >60)
Glucose, Bld: 191 mg/dL — ABNORMAL HIGH (ref 70–99)
Potassium: 4.2 mmol/L (ref 3.5–5.1)
Sodium: 138 mmol/L (ref 135–145)

## 2022-09-30 LAB — CBC
HCT: 25.9 % — ABNORMAL LOW (ref 39.0–52.0)
Hemoglobin: 8 g/dL — ABNORMAL LOW (ref 13.0–17.0)
MCH: 30.8 pg (ref 26.0–34.0)
MCHC: 30.9 g/dL (ref 30.0–36.0)
MCV: 99.6 fL (ref 80.0–100.0)
Platelets: 202 10*3/uL (ref 150–400)
RBC: 2.6 MIL/uL — ABNORMAL LOW (ref 4.22–5.81)
RDW: 15.6 % — ABNORMAL HIGH (ref 11.5–15.5)
WBC: 9 10*3/uL (ref 4.0–10.5)
nRBC: 0.4 % — ABNORMAL HIGH (ref 0.0–0.2)

## 2022-09-30 LAB — MAGNESIUM: Magnesium: 1.6 mg/dL — ABNORMAL LOW (ref 1.7–2.4)

## 2022-09-30 LAB — TSH: TSH: 1.458 u[IU]/mL (ref 0.350–4.500)

## 2022-09-30 MED ORDER — GLYCERIN (LAXATIVE) 2 G RE SUPP
1.0000 | Freq: Every day | RECTAL | Status: DC | PRN
  Filled 2022-09-30: qty 1

## 2022-09-30 MED ORDER — MAGNESIUM SULFATE 2 GM/50ML IV SOLN
2.0000 g | Freq: Once | INTRAVENOUS | Status: AC
  Administered 2022-09-30: 2 g via INTRAVENOUS
  Filled 2022-09-30: qty 50

## 2022-09-30 NOTE — Progress Notes (Signed)
FMTS Interim Progress Note  S: Patient assessed at bedside with Dr. Joelyn Oms. Resting comfortably with [place] in place. RN reports no concerns.  O: BP 130/67 (BP Location: Right Arm)   Pulse 91   Temp 98.9 F (37.2 C) (Oral)   Resp 20   Ht '5\' 3"'$  (1.6 m)   Wt 66.3 kg   SpO2 99%   BMI 25.89 kg/m    General: Eyes closed, laying slightly inclined in bed. NAD CV: RRR per telemetry Pulm: Normal WOB on 2L Ext: Well perfused. Skin: Warm, dry  A/P: PNA Breathing comfortably on 2L [place]. Continue to wean oxygen as tolerated.  Urinary retention RN noted spontaneous void this evening. Low concern for continued retention at this time  Ileus No reported BM. Patient did not express concerns of N/V since the removal of NG early today. S/p glycerin suppository -Monitor for BM  Remainder of plan per day team.  Colletta Maryland, MD 09/30/2022, 10:27 PM PGY-1, [place] [facility] pager [phone]

## 2022-09-30 NOTE — Progress Notes (Signed)
NG tube removed per order without difficulty. Marcille Blanco, RN

## 2022-09-30 NOTE — Progress Notes (Incomplete)
Rounding Note    Patient Name: [name] Date of Encounter: 09/30/2022  [place] Cardiologist: Elouise Munroe, MD   Subjective   ***  Inpatient Medications    Scheduled Meds:  apixaban  10 mg Oral BID   Followed by   apixaban  5 mg Oral BID   aspirin EC  81 mg Oral Daily   atorvastatin  20 mg Oral Daily   calcitRIOL  0.5 mcg Oral Q T,Th,Sa-HD   Chlorhexidine Gluconate Cloth  6 each Topical Q0600   darbepoetin (ARANESP) injection - DIALYSIS  150 mcg Subcutaneous Q Thu-1800   insulin aspart  0-6 Units Subcutaneous TID WC   sevelamer carbonate  800 mg Oral TID WC   Continuous Infusions:  amiodarone 30 mg/hr (09/30/22 0019)   ceFEPime (MAXIPIME) IV 1 g (09/29/22 2030)   PRN Meds: ondansetron (ZOFRAN) IV   Vital Signs    Vitals:   09/30/22 0045 09/30/22 0700 09/30/22 0845 09/30/22 0857  BP: (!) 112/59 115/64 118/60   Pulse: 96 98 94   Resp: 17  18   Temp: 100.2 F (37.9 C)  99.2 F (37.3 C)   TempSrc: Oral  Oral   SpO2: 100% 99% 98%   Weight: 66.3 kg   66.3 kg  Height:        Intake/Output Summary (Last 24 hours) at 09/30/2022 1052 Last data filed at 09/30/2022 [id number] Gross per 24 hour  Intake 419.93 ml  Output 3500 ml  Net -3080.07 ml      09/30/2022    8:57 AM 09/30/2022   12:45 AM 09/29/2022   12:54 PM  Last 3 Weights  Weight (lbs) 146 lb 2.6 oz 146 lb 2.6 oz 138 lb 3.7 oz  Weight (kg) 66.3 kg 66.3 kg 62.7 kg      Telemetry    *** - Personally Reviewed  ECG    Atrial fibrillation with RVR, HR 113  - Personally Reviewed  Physical Exam  *** GEN: No acute distress.   Neck: No JVD Cardiac: RRR, no murmurs, rubs, or gallops.  Respiratory: Clear to auscultation bilaterally. GI: Soft, nontender, non-distended  MS: No edema; No deformity. Neuro:  Nonfocal  Psych: Normal affect   Labs    High Sensitivity Troponin:   Recent Labs  Lab 09/23/22 1238 09/23/22 1424 09/29/22 1509 09/29/22 1740  TROPONINIHS 5,494* 6,320* 1,001*  1,048*     Chemistry Recent Labs  Lab 09/25/22 0040 09/26/22 0045 09/27/22 0035 09/28/22 0047 09/29/22 0030 09/30/22 0147  NA 133* 129* 132* 135 135 138  K 4.4 4.1 4.3 4.1 4.7 4.2  CL 92* 89* 90* 88* 90* 93*  CO2 21* 22 21* '28 25 25  '$ GLUCOSE 132* 187* 246* 386* 328* 191*  BUN 34* 60* 51* 63* 101* 59*  CREATININE 4.80* 6.48* 5.40* 4.69* 6.86* 4.58*  CALCIUM 8.1* 7.7* 8.4* 8.1* 8.1* 7.9*  MG  --   --   --   --   --  1.6*  ALBUMIN 2.3* 2.1* 2.2*  --   --   --   GFRNONAA 13* 9* 11* 13* 8* 14*  ANIONGAP 20* 18* 21* 19* 20* 20*    Lipids No results for input(s): "CHOL", "TRIG", "HDL", "LABVLDL", "LDLCALC", "CHOLHDL" in the last 168 hours.  Hematology Recent Labs  Lab 09/28/22 0047 09/29/22 0030 09/30/22 0147  WBC 14.0* 11.5* 9.0  RBC 3.00* 2.73* 2.60*  HGB 9.6* 8.7* 8.0*  HCT 28.8* 26.5* 25.9*  MCV 96.0 97.1 99.6  MCH 32.0 31.9 30.8  MCHC 33.3 32.8 30.9  RDW 15.4 15.4 15.6*  PLT 220 203 202   Thyroid  Recent Labs  Lab 09/30/22 0147  TSH 1.458    BNP Recent Labs  Lab 09/27/22 0035  BNP 3,505.9*    DDimer No results for input(s): "DDIMER" in the last 168 hours.   Radiology    CT ABDOMEN PELVIS WO CONTRAST  Result Date: 09/29/2022 CLINICAL DATA:  Abdominal pain. Ascites. EXAM: CT ABDOMEN AND PELVIS WITHOUT CONTRAST TECHNIQUE: Multidetector CT imaging of the abdomen and pelvis was performed following the standard protocol without IV contrast. RADIATION DOSE REDUCTION: This exam was performed according to the departmental dose-optimization program which includes automated exposure control, adjustment of the mA and/or kV according to patient size and/or use of iterative reconstruction technique. COMPARISON:  Abdominopelvic CT 11/29/2019, chest CT 09/23/2022 FINDINGS: Lower chest: Motion artifact limitations. Right pleural effusion has near completely resolved. Left pleural effusion has improved. Residual bilateral airspace consolidation and ground-glass densities in the  lingula. Coronary artery calcifications. Hepatobiliary: No focal liver abnormalities on this unenhanced exam. Elongated right lobe of the liver with small left lobe, likely Riedel's lobe configuration. Clips in the gallbladder fossa postcholecystectomy. No biliary dilatation. Pancreas: No ductal dilatation or inflammation. Spleen: Normal in size without focal abnormality. Adrenals/Urinary Tract: Normal adrenal glands. Bilateral renal parenchymal atrophy. Punctate nonobstructing stones versus vascular calcification at the hila. There is prominence of bilateral renal pelvises. Slight ureteral prominence. Moderate urinary bladder distension, volume = 914 cm^3. No bladder wall thickening. Stomach/Bowel: Enteric tube tip in the stomach. The stomach is partially distended. No gastric wall thickening. There is no small bowel obstruction or inflammation. Normal appendix. Moderate volume of stool throughout the colon. Scattered colonic diverticula without diverticulitis. Vascular/Lymphatic: Aortic atherosclerosis. No aneurysm. Moderate branch atherosclerosis. No bulky adenopathy. Reproductive: Prostate is unremarkable. Other: No ascites. Small fat containing umbilical hernia. Moderate fat containing left inguinal hernia. Musculoskeletal: Moderate facet hypertrophy with multilevel spondylosis, anterior spurring. There are no acute or suspicious osseous abnormalities. IMPRESSION: 1. Moderate urinary bladder distension, recommend correlation for bladder outlet obstruction. 2. Moderate volume of stool throughout the colon, can be seen with constipation. Colonic diverticulosis without diverticulitis. 3. No ascites. 4. Bilateral renal parenchymal atrophy. Punctate nonobstructing stones versus vascular calcification at the hila. 5. Near complete resolution of right pleural effusion and improved left pleural effusion since prior chest CT. Residual bilateral airspace consolidation and ground-glass densities in the lingula, likely  representing improving pneumonia. Aortic Atherosclerosis (ICD10-I70.0). Electronically Signed   By: Keith Rake M.D.   On: 09/29/2022 22:35   DG Abd 1 View  Result Date: 09/29/2022 CLINICAL DATA:  NG tube placement EXAM: ABDOMEN - 1 VIEW COMPARISON:  None Available. FINDINGS: Esophagogastric tube with tip and side port below the diaphragm. Nonobstructive pattern of included bowel gas. IMPRESSION: Esophagogastric tube with tip and side port below the diaphragm. Electronically Signed   By: Delanna Ahmadi M.D.   On: 09/29/2022 18:36   DG Abd 1 View  Result Date: 09/29/2022 CLINICAL DATA:  Abdominal pain. EXAM: ABDOMEN - 1 VIEW COMPARISON:  September 19, 2013. FINDINGS: Moderate gastric distention is noted. No large or small bowel dilatation is noted. Mild amount of stool seen throughout the colon and rectum. No abnormal calcifications are noted. IMPRESSION: Moderate gastric distention is noted concerning for possible gastric outlet obstruction. Electronically Signed   By: Marijo Conception M.D.   On: 09/29/2022 15:49   DG CHEST PORT 1 VIEW  Result  Date: 09/29/2022 CLINICAL DATA:  Respiration abnormal EXAM: PORTABLE CHEST 1 VIEW COMPARISON:  CXR 09/27/22 FINDINGS: Redemonstrated small bilateral pleural effusions. Unchanged cardiac and mediastinal contours. No pneumothorax. Compared to prior exam there is interval increase in bilateral interstitial opacities as well as possible interval development of a new right basilar pulmonary opacity. Radiographically apparent displaced rib fractures. Visualized upper abdomen is notable for marked gastric gaseous distention. IMPRESSION: 1. Interval increase in bilateral interstitial opacities as well as possible interval development of a new right basilar pulmonary opacity. Findings may worsening pulmonary edema with a superimposed infection of the right lung base. 2. Gastric gaseous distention. Electronically Signed   By: Marin Roberts M.D.   On: 09/29/2022 08:12     Cardiac Studies   Echocardiogram 09/23/22 1. There is severe hypokinesis of the mid-to-apical septal, mid-to-apical  anterior, and mid-to-apical inferior LV segments and apex. No LV thrombus  visualized. Findings concerning for possible LAD disease vs takostubo  cardiomyopathy. Left ventricular  ejection fraction, by estimation, is 30 to 35%. The left ventricle has  moderately decreased function. The left ventricle demonstrates regional  wall motion abnormalities (see scoring diagram/findings for description).  Left ventricular diastolic parameters   are consistent with Grade II diastolic dysfunction (pseudonormalization).  The E/e' is 17.2.   2. Right ventricular systolic function is mildly reduced. The right  ventricular size is normal.   3. Left atrial size was mildly dilated.   4. The mitral valve is grossly normal. Mild mitral valve regurgitation.   5. The aortic valve is tricuspid. Aortic valve regurgitation is not  visualized. Aortic valve sclerosis is present, with no evidence of aortic  valve stenosis.   6. The inferior vena cava is dilated in size with <50% respiratory  variability, suggesting right atrial pressure of 15 mmHg.   Comparison(s): Compared to prior TTE in 2021, the EF has dropped from  50-55% to 30-35% with WMA as detailed above.   Patient Profile     67 y.o. male with ESRD on HD with prior BKA, PAD, DMII, HTN who was admitted with sepsis secondary to pneumonia with course complicated by newly diagnosed systolic HF and new AFib for which Cardiology was consulted.   Assessment & Plan    Newly Diagnosed Atrial Fibrillation with RVR  - Patient currently admitted with sepsis due to PNA. Found to have new onset atrial fibrillation with RVR  - Patient is currently on IV amiodarone - Per telemetry,  - CHADS-VASc 4- continue eliquis 5 mg BID   Acute systolic CHF  - Echocardiogram this admission showed EF 30-35% with regional wall motion abnormalities, grade II  diastolic dysfunction, mildly reduced RV systolic function. Pattern is concerning for Takostubo cardiomyopathy vs less likely LAD disease.  - Prior cardiac catheterization in 2021 showed diffuse disease (70% mid-LAD, 100% prox Lcx, 90% distal LAD) - Patient on on HD for volume management  - GDMT limited due to ESRD and hypotension. Patient no longer on metoprolol due to hypotension  - Plan to repeat echocardiogram in a few months. If EF remains depressed at that time, can consider ischemic evaluation. No plans for cath this admission due to acute illness (sepsis, pna), lack of chest pain.   CAD  - Cardiac catheterization in 2021 showed moderate CAD - As above, no plans for cath this admission given acute illness, lack of chest pain. Can reassess in the future - Not on BB due to hypotension  - Stop ASA now that patient is on eliquis  -  Continue lipitor 20 mg daily   Otherwise per primary  - Abdominal distension  - Sepsis secondary to PNA  - ESRD on HD   For questions or updates, please contact [place] Please consult [email or web address] for contact info under        Signed, Margie Billet, PA-C  09/30/2022, 10:52 AM

## 2022-09-30 NOTE — Progress Notes (Signed)
Daily Progress Note Intern Pager: [phone]  Patient name: [name] Medical record number: [id number] Date of birth: 11/24/1955 Age: 67 y.o. Gender: male  Primary Care Provider: Patient, No Pcp Per Consultants: nephrology, cardiology, pulm Code Status: FULL CODE  Pt Overview and Major Events to Date:  2/22 - admitted 2/29 - hypotensive, Afib with RVR s/p HD   Assessment and Plan: [place] [name] is a 67 y.o. male presenting with cough and SOB.  Past medical history includes ESRD on HD T/TH/Sat, heart failure, well-controlled T2DM, PAD, unilateral BKA, hyperparathyroidism, hypertension.   Pneumonia On admission patient meeting 3/4 SIRS criteria. WBC 10.6 > 9.7 > 11.5. MRSA swab negative. CTA PE negative for PE, did demonstrate multifocal PNA w/ bilat pleural effusions. Xray 2/29 showed interval increase in bilateral interstitial opacities as well as possible interval development of a new right basilar pulmonary opacity. Findings may worsening pulmonary edema with a superimposed infection of the right lung base.. Now on room air satting 99%. Improvement in work of breathing, but still diminished breath sounds on auscultation. D/c BiPAP per pulm. S/p 3 day course of solumedrol - pulm signed off - RT eval and treat - Monitor resp status - SPO2 > 92% - continue cefepime -10/02/22 - Incentive spirometry  ESRD on dialysis ([place]) ESRD on HD T/Th/Sat. Na 135. Cr 4.69 - nephrology following, appreciate recs  Heart failure, unspecified ([place]) On admission BNP 3189. Echo (2/23) showed severe hypokinesis in LV, concerning for LAD disease vs takostubo, EF 30-35% (previously 50-55% on echo in 2021), LV RWMA, G2DD. Appears euvolemic. Weight stable at 72.1 kg. Cards recommending volume management w/ HD. Per pulm can benefit from GDMT and started on losartan - losartan held for surgery -- AM CMP - Strict I's and O's - daily weights  DVT (deep venous thrombosis) (HCC) Patient noted to have age  indeterminate DVT in right popliteal vein on [place] yesterday, following recommendation from CTPE study. S/p heparin gtt for NSTEMI, for 48 hours. - continue apixaban 10 mg PO BID x 5 days; then 5 mg PO BID   NSTEMI (non-ST elevated myocardial infarction) ([place]) S/p heparin gtt for 48 hrs (2/23-2/25). Also wanting to start GDMT - not candidate for heart cath. Patient not candidate for Entresto/ACE. -Cardiology following, appreciate recs - continue apixaban 10 mg PO BID x 5 days; then 5 mg PO BID  - held metoprolol 25 mg for surgery  Urinary retention Noted to have urinary retention with 844m on bladder scan overnight. After I/O cath, bladder scan with 019m Also some concern for Lambert-Eaton or myasthenia gravis given weakness, constipation, respiratory failure  -f/u [name]  [facility]. Has NG tube in. Output 800 mL overnight. Constipation noted on CTAP. -continue to monitor I/O.  -d/c NG tube -plan for bowel rest  -bowel regimen, will give glycerin suppository  Paroxysmal atrial fibrillation (HCC) Patient with Afib with RVR after HD yesterday. Cardiology consulted. Started on amiodarone. Continued on eliquis.  -cardiology following, appreciate recs -continue IV amiodarone -continue eliquis '5mg'$  BID -d/c ASA   Type 2 diabetes mellitus, controlled (HCDozierA1c of 5.6 this admission. Glucose 180s - 190s. - CBG 4 times daily before meals and bedtime - given 1 unit novolog this AM - on vsSSI   FEN/GI: NPO PPx: on eliquis Dispo: [facility]  Subjective:  Patient answers mostly with yes/no. Denies any chest pain, SOB. Hasn't had BM.   Objective: Temp:  [98.5 F (36.9 C)-100.2 F (37.9 C)] 99.2 F (  37.3 C) (03/01 1045) Pulse Rate:  [74-124] 97 (03/01 1045) Resp:  [16-20] 18 (03/01 1045) BP: (66-118)/(37-64) 117/64 (03/01 1045) SpO2:  [96 %-100 %] 99 % (03/01 1045) Weight:  [66.3 kg] 66.3 kg (03/01 0857) Physical Exam: General: in no acute distress,  chronically ill-appearing Cardiovascular: regular rate.  Respiratory: on 4.5L [place]. End expiratory wheezing.  Abdomen: soft, non-tender, non-distended.  Extremities: moving all extremities spontaneously.   Laboratory: Most recent CBC Lab Results  Component Value Date   WBC 9.0 09/30/2022   HGB 8.0 (L) 09/30/2022   HCT 25.9 (L) 09/30/2022   MCV 99.6 09/30/2022   PLT 202 09/30/2022   Most recent BMP    Latest Ref Rng & Units 09/30/2022    1:47 AM  BMP  Glucose 70 - 99 mg/dL 191   BUN 8 - 23 mg/dL 59   Creatinine 0.61 - 1.24 mg/dL 4.58   Sodium 135 - 145 mmol/L 138   Potassium 3.5 - 5.1 mmol/L 4.2   Chloride 98 - 111 mmol/L 93   CO2 22 - 32 mmol/L 25   Calcium 8.9 - 10.3 mg/dL 7.9     Imaging/Diagnostic Tests: 2/29 CT A/P IMPRESSION: 1. Moderate urinary bladder distension, recommend correlation for bladder outlet obstruction. 2. Moderate volume of stool throughout the colon, can be seen with constipation. Colonic diverticulosis without diverticulitis. 3. No ascites. 4. Bilateral renal parenchymal atrophy. Punctate nonobstructing stones versus vascular calcification at the hila. 5. Near complete resolution of right pleural effusion and improved left pleural effusion since prior chest CT. Residual bilateral airspace consolidation and ground-glass densities in the lingula, likely representing improving pneumonia.  [name], MD, PGY-1 09/30/2022, 1:57 PM  PGY-1, [place] Intern pager: [phone], text pages welcome Secure chat group [place]

## 2022-09-30 NOTE — Assessment & Plan Note (Deleted)
Resolved. Last set of bladder scans were 29m. There were concerns for Lambert-Eaton or myasthenia gravis given weakness, constipation, respiratory failure and urinary retention.  -f/u [facility]aral[facility], Ach receptor antibody

## 2022-09-30 NOTE — Progress Notes (Signed)
[facility] Progress Note    Assessment/ Plan:   Sepsis / bilat PNA - on IV abx & steroids per pmd Acute systolic HF / NSTEMI - +trop 5500, s/p IV heparin per cardiology. LVEF 30-35% by echo here. Ischemic workup planned for outpatient AHRF - Uf'ing as tolerated ESRD - on HD TTS. S/p extra HD treatment 2/26. back on TTS schedule-HD tomorrow HTN/ volume - BP's acceptable, is below dry wt w/ sig resp distress initially, suspected vol overload. Has lost body wt. 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 Follow. 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 Cont po vdra.  Possible ileus: NGT in place now-per primary Afib w/ RVR-seen by cardio, on amio gtt. Seems to be in NSR now. On eliquis  OP HD: TTS G-O 3h 78mn  400/700   72.5kg  2/2.5 bath  LUA AVF  Hep 2000 - last HD 2/22, post wt 72.6kg   - rocaltrol 0.50 mcg po tiw - venofer 50 mg weekly IV - mircera 120 mcg IV q 4 wks, last 2/15, due 2/29  Subjective:   Patient seen and examined bedside. He reports that his breathing is better. Was hypotensive and tachycardia post HD yesterday, rec'd 250cc of NS. Has NGT now   Objective:   BP 118/60   Pulse 94   Temp 99.2 F (37.3 C) (Oral)   Resp 18   Ht '5\' 3"'$  (1.6 m)   Wt 66.3 kg   SpO2 98%   BMI 25.89 kg/m   Intake/Output Summary (Last 24 hours) at 09/30/2022 [id number] data filed at 09/30/2022 [id number] per 24 hour  Intake 419.93 ml  Output 3500 ml  Net -3080.07 ml   Weight change: 18.2 kg  Physical Exam: Gen: NAD CVS: RRR Resp: diminished air entry bibasilar, normal WOB Abd: soft Ext: left bka, no edema Neuro: awake, alert Dialysis access: LUE AVF +b/t  Imaging: CT ABDOMEN PELVIS WO CONTRAST  Result Date: 09/29/2022 CLINICAL DATA:  Abdominal pain. Ascites. EXAM: CT ABDOMEN AND PELVIS WITHOUT CONTRAST TECHNIQUE: Multidetector CT imaging of the abdomen and pelvis was performed following the standard protocol without IV  contrast. RADIATION DOSE REDUCTION: This exam was performed according to the departmental dose-optimization program which includes automated exposure control, adjustment of the mA and/or kV according to patient size and/or use of iterative reconstruction technique. COMPARISON:  Abdominopelvic CT 11/29/2019, chest CT 09/23/2022 FINDINGS: Lower chest: Motion artifact limitations. Right pleural effusion has near completely resolved. Left pleural effusion has improved. Residual bilateral airspace consolidation and ground-glass densities in the lingula. Coronary artery calcifications. Hepatobiliary: No focal liver abnormalities on this unenhanced exam. Elongated right lobe of the liver with small left lobe, likely Riedel's lobe configuration. Clips in the gallbladder fossa postcholecystectomy. No biliary dilatation. Pancreas: No ductal dilatation or inflammation. Spleen: Normal in size without focal abnormality. Adrenals/Urinary Tract: Normal adrenal glands. Bilateral renal parenchymal atrophy. Punctate nonobstructing stones versus vascular calcification at the hila. There is prominence of bilateral renal pelvises. Slight ureteral prominence. Moderate urinary bladder distension, volume = 914 cm^3. No bladder wall thickening. Stomach/Bowel: Enteric tube tip in the stomach. The stomach is partially distended. No gastric wall thickening. There is no small bowel obstruction or inflammation. Normal appendix. Moderate volume of stool throughout the colon. Scattered colonic diverticula without diverticulitis. Vascular/Lymphatic: Aortic atherosclerosis. No aneurysm. Moderate branch atherosclerosis. No bulky adenopathy. Reproductive: Prostate is unremarkable. Other: No ascites. Small fat containing umbilical hernia. Moderate fat containing left  inguinal hernia. Musculoskeletal: Moderate facet hypertrophy with multilevel spondylosis, anterior spurring. There are no acute or suspicious osseous abnormalities. IMPRESSION: 1. Moderate  urinary bladder distension, recommend correlation for bladder outlet obstruction. 2. Moderate volume of stool throughout the colon, can be seen with constipation. Colonic diverticulosis without diverticulitis. 3. No ascites. 4. Bilateral renal parenchymal atrophy. Punctate nonobstructing stones versus vascular calcification at the hila. 5. Near complete resolution of right pleural effusion and improved left pleural effusion since prior chest CT. Residual bilateral airspace consolidation and ground-glass densities in the lingula, likely representing improving pneumonia. Aortic Atherosclerosis (ICD10-I70.0). Electronically Signed   By: Keith Rake M.D.   On: 09/29/2022 22:35   DG Abd 1 View  Result Date: 09/29/2022 CLINICAL DATA:  NG tube placement EXAM: ABDOMEN - 1 VIEW COMPARISON:  None Available. FINDINGS: Esophagogastric tube with tip and side port below the diaphragm. Nonobstructive pattern of included bowel gas. IMPRESSION: Esophagogastric tube with tip and side port below the diaphragm. Electronically Signed   By: Delanna Ahmadi M.D.   On: 09/29/2022 18:36   DG Abd 1 View  Result Date: 09/29/2022 CLINICAL DATA:  Abdominal pain. EXAM: ABDOMEN - 1 VIEW COMPARISON:  September 19, 2013. FINDINGS: Moderate gastric distention is noted. No large or small bowel dilatation is noted. Mild amount of stool seen throughout the colon and rectum. No abnormal calcifications are noted. IMPRESSION: Moderate gastric distention is noted concerning for possible gastric outlet obstruction. Electronically Signed   By: Marijo Conception M.D.   On: 09/29/2022 15:49   DG CHEST PORT 1 VIEW  Result Date: 09/29/2022 CLINICAL DATA:  Respiration abnormal EXAM: PORTABLE CHEST 1 VIEW COMPARISON:  CXR 09/27/22 FINDINGS: Redemonstrated small bilateral pleural effusions. Unchanged cardiac and mediastinal contours. No pneumothorax. Compared to prior exam there is interval increase in bilateral interstitial opacities as well as possible  interval development of a new right basilar pulmonary opacity. Radiographically apparent displaced rib fractures. Visualized upper abdomen is notable for marked gastric gaseous distention. IMPRESSION: 1. Interval increase in bilateral interstitial opacities as well as possible interval development of a new right basilar pulmonary opacity. Findings may worsening pulmonary edema with a superimposed infection of the right lung base. 2. Gastric gaseous distention. Electronically Signed   By: Marin Roberts M.D.   On: 09/29/2022 08:12    Labs: BMET Recent Labs  Lab 09/23/22 1238 09/24/22 0109 09/25/22 0040 09/26/22 0045 09/27/22 0035 09/28/22 0047 09/29/22 0030 09/30/22 0147  NA  --  129* 133* 129* 132* 135 135 138  K  --  3.8 4.4 4.1 4.3 4.1 4.7 4.2  CL  --  88* 92* 89* 90* 88* 90* 93*  CO2  --  20* 21* 22 21* '28 25 25  '$ GLUCOSE  --  135* 132* 187* 246* 386* 328* 191*  BUN  --  53* 34* 60* 51* 63* 101* 59*  CREATININE  --  6.12* 4.80* 6.48* 5.40* 4.69* 6.86* 4.58*  CALCIUM  --  7.5* 8.1* 7.7* 8.4* 8.1* 8.1* 7.9*  PHOS 5.0* 5.6* 4.5 4.6 4.6  --   --   --    CBC Recent Labs  Lab 09/27/22 0035 09/28/22 0047 09/29/22 0030 09/30/22 0147  WBC 9.7 14.0* 11.5* 9.0  HGB 8.8* 9.6* 8.7* 8.0*  HCT 27.9* 28.8* 26.5* 25.9*  MCV 98.6 96.0 97.1 99.6  PLT 171 220 203 202    Medications:     apixaban  10 mg Oral BID   Followed by   apixaban  5 mg  Oral BID   aspirin EC  81 mg Oral Daily   atorvastatin  20 mg Oral Daily   calcitRIOL  0.5 mcg Oral Q T,Th,Sa-HD   Chlorhexidine Gluconate Cloth  6 each Topical Q0600   darbepoetin (ARANESP) injection - DIALYSIS  150 mcg Subcutaneous Q Thu-1800   insulin aspart  0-6 Units Subcutaneous TID WC   sevelamer carbonate  800 mg Oral TID WC      Gean Quint, MD [facility] 09/30/2022, 9:03 AM

## 2022-09-30 NOTE — Assessment & Plan Note (Addendum)
Patient with Afib with RVR after HD yesterday. Cardiology consulted. Started on amiodarone. Continued on eliquis.  -cardiology following, appreciate recs -continue IV amiodarone -continue eliquis '5mg'$  BID -d/c ASA

## 2022-09-30 NOTE — Assessment & Plan Note (Addendum)
Suspected ileus. Has NG tube in. Output 800 mL overnight. Constipation noted on CTAP. -continue to monitor I/O.  -d/c NG tube -plan for bowel rest  -bowel regimen, will give glycerin suppository

## 2022-09-30 NOTE — Progress Notes (Signed)
Rounding Note    Patient Name: [name] Date of Encounter: 09/30/2022  [facility] Cardiologist: Elouise Munroe, MD   Subjective   Converted back to NSR. States his abdominal distension is improving.   Inpatient Medications    Scheduled Meds:  apixaban  10 mg Oral BID   Followed by   apixaban  5 mg Oral BID   aspirin EC  81 mg Oral Daily   atorvastatin  20 mg Oral Daily   calcitRIOL  0.5 mcg Oral Q T,Th,Sa-HD   Chlorhexidine Gluconate Cloth  6 each Topical Q0600   darbepoetin (ARANESP) injection - DIALYSIS  150 mcg Subcutaneous Q Thu-1800   insulin aspart  0-6 Units Subcutaneous TID WC   sevelamer carbonate  800 mg Oral TID WC   Continuous Infusions:  amiodarone 30 mg/hr (09/30/22 1240)   ceFEPime (MAXIPIME) IV 1 g (09/29/22 2030)   PRN Meds:    Vital Signs    Vitals:   09/30/22 0700 09/30/22 0845 09/30/22 0857 09/30/22 1045  BP: 115/64 118/60  117/64  Pulse: 98 94  97  Resp:  18  18  Temp:  99.2 F (37.3 C)  99.2 F (37.3 C)  TempSrc:  Oral  Oral  SpO2: 99% 98%  99%  Weight:   66.3 kg   Height:        Intake/Output Summary (Last 24 hours) at 09/30/2022 1333 Last data filed at 09/30/2022 [id number] Gross per 24 hour  Intake 419.93 ml  Output 1600 ml  Net -1180.07 ml       09/30/2022    8:57 AM 09/30/2022   12:45 AM 09/29/2022   12:54 PM  Last 3 Weights  Weight (lbs) 146 lb 2.6 oz 146 lb 2.6 oz 138 lb 3.7 oz  Weight (kg) 66.3 kg 66.3 kg 62.7 kg      Telemetry    NSR - Personally Reviewed  ECG    No new tracing - Personally Reviewed  Physical Exam   GEN: Comfortable, NAD Neck: No JVD Cardiac: RRR, 2/6 systolic murmur Respiratory: Diminished but clear GI: Distended, soft, NTTP MS: Left BKA. No edema on right Neuro:  Nonfocal  Psych: Normal affect   Labs    High Sensitivity Troponin:   Recent Labs  Lab 09/23/22 1238 09/23/22 1424 09/29/22 1509 09/29/22 1740  TROPONINIHS 5,494* 6,320* 1,001* 1,048*       Chemistry Recent Labs  Lab 09/25/22 0040 09/26/22 0045 09/27/22 0035 09/28/22 0047 09/29/22 0030 09/30/22 0147  NA 133* 129* 132* 135 135 138  K 4.4 4.1 4.3 4.1 4.7 4.2  CL 92* 89* 90* 88* 90* 93*  CO2 21* 22 21* '28 25 25  '$ GLUCOSE 132* 187* 246* 386* 328* 191*  BUN 34* 60* 51* 63* 101* 59*  CREATININE 4.80* 6.48* 5.40* 4.69* 6.86* 4.58*  CALCIUM 8.1* 7.7* 8.4* 8.1* 8.1* 7.9*  MG  --   --   --   --   --  1.6*  ALBUMIN 2.3* 2.1* 2.2*  --   --   --   GFRNONAA 13* 9* 11* 13* 8* 14*  ANIONGAP 20* 18* 21* 19* 20* 20*     Lipids No results for input(s): "CHOL", "TRIG", "HDL", "LABVLDL", "LDLCALC", "CHOLHDL" in the last 168 hours.  Hematology Recent Labs  Lab 09/28/22 0047 09/29/22 0030 09/30/22 0147  WBC 14.0* 11.5* 9.0  RBC 3.00* 2.73* 2.60*  HGB 9.6* 8.7* 8.0*  HCT 28.8* 26.5* 25.9*  MCV 96.0 97.1 99.6  MCH  32.0 31.9 30.8  MCHC 33.3 32.8 30.9  RDW 15.4 15.4 15.6*  PLT 220 203 202    Thyroid  Recent Labs  Lab 09/30/22 0147  TSH 1.458    BNP Recent Labs  Lab 09/27/22 0035  BNP 3,505.9*     DDimer No results for input(s): "DDIMER" in the last 168 hours.   Radiology    CT ABDOMEN PELVIS WO CONTRAST  Result Date: 09/29/2022 CLINICAL DATA:  Abdominal pain. Ascites. EXAM: CT ABDOMEN AND PELVIS WITHOUT CONTRAST TECHNIQUE: Multidetector CT imaging of the abdomen and pelvis was performed following the standard protocol without IV contrast. RADIATION DOSE REDUCTION: This exam was performed according to the departmental dose-optimization program which includes automated exposure control, adjustment of the mA and/or kV according to patient size and/or use of iterative reconstruction technique. COMPARISON:  Abdominopelvic CT 11/29/2019, chest CT 09/23/2022 FINDINGS: Lower chest: Motion artifact limitations. Right pleural effusion has near completely resolved. Left pleural effusion has improved. Residual bilateral airspace consolidation and ground-glass densities in the  lingula. Coronary artery calcifications. Hepatobiliary: No focal liver abnormalities on this unenhanced exam. Elongated right lobe of the liver with small left lobe, likely Riedel's lobe configuration. Clips in the gallbladder fossa postcholecystectomy. No biliary dilatation. Pancreas: No ductal dilatation or inflammation. Spleen: Normal in size without focal abnormality. Adrenals/Urinary Tract: Normal adrenal glands. Bilateral renal parenchymal atrophy. Punctate nonobstructing stones versus vascular calcification at the hila. There is prominence of bilateral renal pelvises. Slight ureteral prominence. Moderate urinary bladder distension, volume = 914 cm^3. No bladder wall thickening. Stomach/Bowel: Enteric tube tip in the stomach. The stomach is partially distended. No gastric wall thickening. There is no small bowel obstruction or inflammation. Normal appendix. Moderate volume of stool throughout the colon. Scattered colonic diverticula without diverticulitis. Vascular/Lymphatic: Aortic atherosclerosis. No aneurysm. Moderate branch atherosclerosis. No bulky adenopathy. Reproductive: Prostate is unremarkable. Other: No ascites. Small fat containing umbilical hernia. Moderate fat containing left inguinal hernia. Musculoskeletal: Moderate facet hypertrophy with multilevel spondylosis, anterior spurring. There are no acute or suspicious osseous abnormalities. IMPRESSION: 1. Moderate urinary bladder distension, recommend correlation for bladder outlet obstruction. 2. Moderate volume of stool throughout the colon, can be seen with constipation. Colonic diverticulosis without diverticulitis. 3. No ascites. 4. Bilateral renal parenchymal atrophy. Punctate nonobstructing stones versus vascular calcification at the hila. 5. Near complete resolution of right pleural effusion and improved left pleural effusion since prior chest CT. Residual bilateral airspace consolidation and ground-glass densities in the lingula, likely  representing improving pneumonia. Aortic Atherosclerosis (ICD10-I70.0). Electronically Signed   By: Keith Rake M.D.   On: 09/29/2022 22:35   DG Abd 1 View  Result Date: 09/29/2022 CLINICAL DATA:  NG tube placement EXAM: ABDOMEN - 1 VIEW COMPARISON:  None Available. FINDINGS: Esophagogastric tube with tip and side port below the diaphragm. Nonobstructive pattern of included bowel gas. IMPRESSION: Esophagogastric tube with tip and side port below the diaphragm. Electronically Signed   By: Delanna Ahmadi M.D.   On: 09/29/2022 18:36   DG Abd 1 View  Result Date: 09/29/2022 CLINICAL DATA:  Abdominal pain. EXAM: ABDOMEN - 1 VIEW COMPARISON:  September 19, 2013. FINDINGS: Moderate gastric distention is noted. No large or small bowel dilatation is noted. Mild amount of stool seen throughout the colon and rectum. No abnormal calcifications are noted. IMPRESSION: Moderate gastric distention is noted concerning for possible gastric outlet obstruction. Electronically Signed   By: Marijo Conception M.D.   On: 09/29/2022 15:49   DG CHEST PORT 1 VIEW  Result Date: 09/29/2022 CLINICAL DATA:  Respiration abnormal EXAM: PORTABLE CHEST 1 VIEW COMPARISON:  CXR 09/27/22 FINDINGS: Redemonstrated small bilateral pleural effusions. Unchanged cardiac and mediastinal contours. No pneumothorax. Compared to prior exam there is interval increase in bilateral interstitial opacities as well as possible interval development of a new right basilar pulmonary opacity. Radiographically apparent displaced rib fractures. Visualized upper abdomen is notable for marked gastric gaseous distention. IMPRESSION: 1. Interval increase in bilateral interstitial opacities as well as possible interval development of a new right basilar pulmonary opacity. Findings may worsening pulmonary edema with a superimposed infection of the right lung base. 2. Gastric gaseous distention. Electronically Signed   By: Marin Roberts M.D.   On: 09/29/2022 08:12     Cardiac Studies   TTE 09/23/22: IMPRESSIONS     1. There is severe hypokinesis of the mid-to-apical septal, mid-to-apical  anterior, and mid-to-apical inferior LV segments and apex. No LV thrombus  visualized. Findings concerning for possible LAD disease vs takostubo  cardiomyopathy. Left ventricular  ejection fraction, by estimation, is 30 to 35%. The left ventricle has  moderately decreased function. The left ventricle demonstrates regional  wall motion abnormalities (see scoring diagram/findings for description).  Left ventricular diastolic parameters   are consistent with Grade II diastolic dysfunction (pseudonormalization).  The E/e' is 17.2.   2. Right ventricular systolic function is mildly reduced. The right  ventricular size is normal.   3. Left atrial size was mildly dilated.   4. The mitral valve is grossly normal. Mild mitral valve regurgitation.   5. The aortic valve is tricuspid. Aortic valve regurgitation is not  visualized. Aortic valve sclerosis is present, with no evidence of aortic  valve stenosis.   6. The inferior vena cava is dilated in size with <50% respiratory  variability, suggesting right atrial pressure of 15 mmHg.   Comparison(s): Compared to prior TTE in 2021, the EF has dropped from  50-55% to 30-35% with WMA as detailed above.   Patient Profile     67 y.o. male with ESRD on HD with prior BKA, PAD, DMII, HTN who was admitted with sepsis secondary to pneumonia with course complicated by newly diagnosed systolic HF and new AFib for which Cardiology was consulted.   Assessment & Plan    #Newly Diagnosed Afib with RVR: CHADs-vasc 4. Newly diagnosed on this admission. Converted to NSR with IV amiodarone. Will continue IV amio today as patient is NPO. -Continue IV amiodarone as patient NPO -Continue apixaban (already on for DVT)  #Abdominal Distension: #Concern for gastric outlet Obstruction: Noted on KUB. Now s/p NGT placement. 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 Prior cath in 2021 with diffuse disease (70% mid-LAD, 100% prox Lcx, 90% distal LAD). He is currently on HD for volume management. GDMT limited due to ESRD and hypotension. Will plan for ischemic work-up as outpatient if EF remains depressed -Cannot tolerate metop due to hypotension -No plan for cath this admission given acute illness, lack of chest pain, and underlying significant comorbidities; can reassess EF as outpatient and consider cath if remains depressed.   #CAD Cath in 2021 with 75% mLAD, 100% prox-mid Lcz, 90% distal LAD. Was recommended for medical management at that time. Suspect trop rise on admission secondary to demand in the setting of sepsis and newly diagnosed systolic HF. Will not plan for  cath on this admission given lack of anginal symptoms and sepsis secondary to pneumonia. Plan for continued medical management and can consider ischemic work-up as outpatient if EF remains depressed on TTE despite clinical improvement. -Holding PO meds due to concern for gastric outlet obstruction -Will plan for repeat TTE as outpatient to reassess EF and if persistently depressed, plan for ischemic evaluation at that time  #Sepsis Secondary to Pneumonia: -Management per primary  #HTN: Now hypotensive. Holding meds.  #ESRD on HD: -Management per nephrology      For questions or updates, please contact [place] Please consult [email or web address] for contact info under        Signed, Freada Bergeron, MD  09/30/2022, 1:33 PM

## 2022-10-01 DIAGNOSIS — I5021 Acute systolic (congestive) heart failure: Secondary | ICD-10-CM | POA: Diagnosis not present

## 2022-10-01 DIAGNOSIS — I480 Paroxysmal atrial fibrillation: Secondary | ICD-10-CM | POA: Diagnosis not present

## 2022-10-01 DIAGNOSIS — K567 Ileus, unspecified: Secondary | ICD-10-CM | POA: Diagnosis not present

## 2022-10-01 DIAGNOSIS — J189 Pneumonia, unspecified organism: Secondary | ICD-10-CM | POA: Diagnosis not present

## 2022-10-01 LAB — MAGNESIUM: Magnesium: 2.5 mg/dL — ABNORMAL HIGH (ref 1.7–2.4)

## 2022-10-01 LAB — CBC
HCT: 25.6 % — ABNORMAL LOW (ref 39.0–52.0)
Hemoglobin: 8.3 g/dL — ABNORMAL LOW (ref 13.0–17.0)
MCH: 31.8 pg (ref 26.0–34.0)
MCHC: 32.4 g/dL (ref 30.0–36.0)
MCV: 98.1 fL (ref 80.0–100.0)
Platelets: 211 10*3/uL (ref 150–400)
RBC: 2.61 MIL/uL — ABNORMAL LOW (ref 4.22–5.81)
RDW: 15.6 % — ABNORMAL HIGH (ref 11.5–15.5)
WBC: 10.2 10*3/uL (ref 4.0–10.5)
nRBC: 0.3 % — ABNORMAL HIGH (ref 0.0–0.2)

## 2022-10-01 LAB — GLUCOSE, CAPILLARY
Glucose-Capillary: 165 mg/dL — ABNORMAL HIGH (ref 70–99)
Glucose-Capillary: 171 mg/dL — ABNORMAL HIGH (ref 70–99)
Glucose-Capillary: 175 mg/dL — ABNORMAL HIGH (ref 70–99)

## 2022-10-01 LAB — PHOSPHORUS: Phosphorus: 5.7 mg/dL — ABNORMAL HIGH (ref 2.5–4.6)

## 2022-10-01 MED ORDER — DOCUSATE SODIUM 50 MG PO CAPS
50.0000 mg | ORAL_CAPSULE | Freq: Every day | ORAL | Status: DC
  Administered 2022-10-01: 50 mg via ORAL
  Filled 2022-10-01 (×2): qty 1

## 2022-10-01 MED ORDER — HEPARIN SODIUM (PORCINE) 1000 UNIT/ML IJ SOLN
INTRAMUSCULAR | Status: AC
  Filled 2022-10-01: qty 2

## 2022-10-01 MED ORDER — GLYCERIN (LAXATIVE) 2 G RE SUPP
1.0000 | Freq: Once | RECTAL | Status: AC
  Administered 2022-10-01: 1 via RECTAL
  Filled 2022-10-01: qty 1

## 2022-10-01 MED ORDER — AMIODARONE HCL 200 MG PO TABS
200.0000 mg | ORAL_TABLET | Freq: Two times a day (BID) | ORAL | Status: DC
  Administered 2022-10-01 – 2022-10-06 (×11): 200 mg via ORAL
  Filled 2022-10-01 (×11): qty 1

## 2022-10-01 MED ORDER — AMIODARONE HCL 200 MG PO TABS: 200.0000 mg | ORAL_TABLET | Freq: Every day | ORAL | Status: DC

## 2022-10-01 NOTE — Discharge Instructions (Addendum)
Information on my medicine - ELIQUIS (apixaban)  Why was Eliquis prescribed for you? Eliquis was prescribed to treat blood clots that may have been found in the veins of your legs (deep vein thrombosis) or in your lungs (pulmonary embolism) and to reduce the risk of them occurring again.  What do You need to know about Eliquis ? The starting dose is 10 mg (two 5 mg tablets) taken TWICE daily for the FIRST SEVEN (7) DAYS, then on 09/30/22 PM the dose is reduced to ONE 5 mg tablet taken TWICE daily.  Eliquis may be taken with or without food.   Try to take the dose about the same time in the morning and in the evening. If you have difficulty swallowing the tablet whole please discuss with your pharmacist how to take the medication safely.  Take Eliquis exactly as prescribed and DO NOT stop taking Eliquis without talking to the doctor who prescribed the medication.  Stopping may increase your risk of developing a new blood clot.  Refill your prescription before you run out.  After discharge, you should have regular check-up appointments with your healthcare provider that is prescribing your Eliquis.    What do you do if you miss a dose? If a dose of ELIQUIS is not taken at the scheduled time, take it as soon as possible on the same day and twice-daily administration should be resumed. The dose should not be doubled to make up for a missed dose.  Important Safety Information A possible side effect of Eliquis is bleeding. You should call your healthcare provider right away if you experience any of the following: Bleeding from an injury or your nose that does not stop. Unusual colored urine (red or dark brown) or unusual colored stools (red or black). Unusual bruising for unknown reasons. A serious fall or if you hit your head (even if there is no bleeding).  Some medicines may interact with Eliquis and might increase your risk of bleeding or clotting while on Eliquis. To help avoid this,  consult your healthcare provider or pharmacist prior to using any new prescription or non-prescription medications, including herbals, vitamins, non-steroidal anti-inflammatory drugs (NSAIDs) and supplements.  This website has more information on Eliquis (apixaban): http://[email or web address].com/eliquis/home

## 2022-10-01 NOTE — Progress Notes (Signed)
Rounding Note    Patient Name: [name] Date of Encounter: 10/01/2022  [place] Cardiologist: Elouise Munroe, MD   Subjective   BP 122/63, remains in sinus rhythm.  Taking PO.  Denies any dyspnea  Inpatient Medications    Scheduled Meds:  apixaban  5 mg Oral BID   atorvastatin  20 mg Oral Daily   calcitRIOL  0.5 mcg Oral Q T,Th,Sa-HD   Chlorhexidine Gluconate Cloth  6 each Topical Q0600   darbepoetin (ARANESP) injection - DIALYSIS  150 mcg Subcutaneous Q Thu-1800   docusate sodium  50 mg Oral Daily   heparin sodium (porcine)       insulin aspart  0-6 Units Subcutaneous TID WC   sevelamer carbonate  800 mg Oral TID WC   Continuous Infusions:  amiodarone 30 mg/hr (10/01/22 0044)   ceFEPime (MAXIPIME) IV 1 g (09/30/22 2031)   PRN Meds:    Vital Signs    Vitals:   09/30/22 1915 10/01/22 0000 10/01/22 0400 10/01/22 0740  BP: 130/67 129/66 135/71 122/63  Pulse: 91 91 93 96  Resp: 20 20  (!) 22  Temp: 98.9 F (37.2 C) 98.9 F (37.2 C)  98.5 F (36.9 C)  TempSrc: Oral Oral  Oral  SpO2: 99% 96% 98% (!) 89%  Weight:  67.6 kg    Height:       No intake or output data in the 24 hours ending 10/01/22 1202     10/01/2022   12:00 AM 09/30/2022    8:57 AM 09/30/2022   12:45 AM  Last 3 Weights  Weight (lbs) 149 lb 0.5 oz 146 lb 2.6 oz 146 lb 2.6 oz  Weight (kg) 67.6 kg 66.3 kg 66.3 kg      Telemetry    NSR - Personally Reviewed  ECG    No new tracing - Personally Reviewed  Physical Exam   GEN: Comfortable, NAD Neck: No JVD Cardiac: RRR, 2/6 systolic murmur Respiratory: Diminished but clear GI: Distended, soft, NTTP MS: Left BKA. No edema on right Neuro:  Nonfocal  Psych: Normal affect   Labs    High Sensitivity Troponin:   Recent Labs  Lab 09/23/22 1238 09/23/22 1424 09/29/22 1509 09/29/22 1740  TROPONINIHS 5,494* 6,320* 1,001* 1,048*      Chemistry Recent Labs  Lab 09/25/22 0040 09/26/22 0045 09/27/22 0035 09/28/22 0047  09/29/22 0030 09/30/22 0147 10/01/22 0112  NA 133* 129* 132*   < > 135 138 139  K 4.4 4.1 4.3   < > 4.7 4.2 4.3  CL 92* 89* 90*   < > 90* 93* 91*  CO2 21* 22 21*   < > '25 25 24  '$ GLUCOSE 132* 187* 246*   < > 328* 191* 171*  BUN 34* 60* 51*   < > 101* 59* 82*  CREATININE 4.80* 6.48* 5.40*   < > 6.86* 4.58* 6.66*  CALCIUM 8.1* 7.7* 8.4*   < > 8.1* 7.9* 7.9*  MG  --   --   --   --   --  1.6* 2.5*  ALBUMIN 2.3* 2.1* 2.2*  --   --   --   --   GFRNONAA 13* 9* 11*   < > 8* 14* 9*  ANIONGAP 20* 18* 21*   < > 20* 20* 24*   < > = values in this interval not displayed.     Lipids No results for input(s): "CHOL", "TRIG", "HDL", "LABVLDL", "LDLCALC", "CHOLHDL" in the last 168  hours.  Hematology Recent Labs  Lab 09/29/22 0030 09/30/22 0147 10/01/22 0112  WBC 11.5* 9.0 10.2  RBC 2.73* 2.60* 2.61*  HGB 8.7* 8.0* 8.3*  HCT 26.5* 25.9* 25.6*  MCV 97.1 99.6 98.1  MCH 31.9 30.8 31.8  MCHC 32.8 30.9 32.4  RDW 15.4 15.6* 15.6*  PLT 203 202 211    Thyroid  Recent Labs  Lab 09/30/22 0147  TSH 1.458     BNP Recent Labs  Lab 09/27/22 0035  BNP 3,505.9*     DDimer No results for input(s): "DDIMER" in the last 168 hours.   Radiology    CT ABDOMEN PELVIS WO CONTRAST  Result Date: 09/29/2022 CLINICAL DATA:  Abdominal pain. Ascites. EXAM: CT ABDOMEN AND PELVIS WITHOUT CONTRAST TECHNIQUE: Multidetector CT imaging of the abdomen and pelvis was performed following the standard protocol without IV contrast. RADIATION DOSE REDUCTION: This exam was performed according to the departmental dose-optimization program which includes automated exposure control, adjustment of the mA and/or kV according to patient size and/or use of iterative reconstruction technique. COMPARISON:  Abdominopelvic CT 11/29/2019, chest CT 09/23/2022 FINDINGS: Lower chest: Motion artifact limitations. Right pleural effusion has near completely resolved. Left pleural effusion has improved. Residual bilateral airspace  consolidation and ground-glass densities in the lingula. Coronary artery calcifications. Hepatobiliary: No focal liver abnormalities on this unenhanced exam. Elongated right lobe of the liver with small left lobe, likely Riedel's lobe configuration. Clips in the gallbladder fossa postcholecystectomy. No biliary dilatation. Pancreas: No ductal dilatation or inflammation. Spleen: Normal in size without focal abnormality. Adrenals/Urinary Tract: Normal adrenal glands. Bilateral renal parenchymal atrophy. Punctate nonobstructing stones versus vascular calcification at the hila. There is prominence of bilateral renal pelvises. Slight ureteral prominence. Moderate urinary bladder distension, volume = 914 cm^3. No bladder wall thickening. Stomach/Bowel: Enteric tube tip in the stomach. The stomach is partially distended. No gastric wall thickening. There is no small bowel obstruction or inflammation. Normal appendix. Moderate volume of stool throughout the colon. Scattered colonic diverticula without diverticulitis. Vascular/Lymphatic: Aortic atherosclerosis. No aneurysm. Moderate branch atherosclerosis. No bulky adenopathy. Reproductive: Prostate is unremarkable. Other: No ascites. Small fat containing umbilical hernia. Moderate fat containing left inguinal hernia. Musculoskeletal: Moderate facet hypertrophy with multilevel spondylosis, anterior spurring. There are no acute or suspicious osseous abnormalities. IMPRESSION: 1. Moderate urinary bladder distension, recommend correlation for bladder outlet obstruction. 2. Moderate volume of stool throughout the colon, can be seen with constipation. Colonic diverticulosis without diverticulitis. 3. No ascites. 4. Bilateral renal parenchymal atrophy. Punctate nonobstructing stones versus vascular calcification at the hila. 5. Near complete resolution of right pleural effusion and improved left pleural effusion since prior chest CT. Residual bilateral airspace consolidation and  ground-glass densities in the lingula, likely representing improving pneumonia. Aortic Atherosclerosis (ICD10-I70.0). Electronically Signed   By: Keith Rake M.D.   On: 09/29/2022 22:35   DG Abd 1 View  Result Date: 09/29/2022 CLINICAL DATA:  NG tube placement EXAM: ABDOMEN - 1 VIEW COMPARISON:  None Available. FINDINGS: Esophagogastric tube with tip and side port below the diaphragm. Nonobstructive pattern of included bowel gas. IMPRESSION: Esophagogastric tube with tip and side port below the diaphragm. Electronically Signed   By: Delanna Ahmadi M.D.   On: 09/29/2022 18:36   DG Abd 1 View  Result Date: 09/29/2022 CLINICAL DATA:  Abdominal pain. EXAM: ABDOMEN - 1 VIEW COMPARISON:  September 19, 2013. FINDINGS: Moderate gastric distention is noted. No large or small bowel dilatation is noted. Mild amount of stool seen throughout the colon and  rectum. No abnormal calcifications are noted. IMPRESSION: Moderate gastric distention is noted concerning for possible gastric outlet obstruction. Electronically Signed   By: Marijo Conception M.D.   On: 09/29/2022 15:49    Cardiac Studies   TTE 09/23/22: IMPRESSIONS     1. There is severe hypokinesis of the mid-to-apical septal, mid-to-apical  anterior, and mid-to-apical inferior LV segments and apex. No LV thrombus  visualized. Findings concerning for possible LAD disease vs takostubo  cardiomyopathy. Left ventricular  ejection fraction, by estimation, is 30 to 35%. The left ventricle has  moderately decreased function. The left ventricle demonstrates regional  wall motion abnormalities (see scoring diagram/findings for description).  Left ventricular diastolic parameters   are consistent with Grade II diastolic dysfunction (pseudonormalization).  The E/e' is 17.2.   2. Right ventricular systolic function is mildly reduced. The right  ventricular size is normal.   3. Left atrial size was mildly dilated.   4. The mitral valve is grossly normal.  Mild mitral valve regurgitation.   5. The aortic valve is tricuspid. Aortic valve regurgitation is not  visualized. Aortic valve sclerosis is present, with no evidence of aortic  valve stenosis.   6. The inferior vena cava is dilated in size with <50% respiratory  variability, suggesting right atrial pressure of 15 mmHg.   Comparison(s): Compared to prior TTE in 2021, the EF has dropped from  50-55% to 30-35% with WMA as detailed above.   Patient Profile     67 y.o. male with ESRD on HD with prior BKA, PAD, DMII, HTN who was admitted with sepsis secondary to pneumonia with course complicated by newly diagnosed systolic HF and new AFib for which Cardiology was consulted.   Assessment & Plan    #Newly Diagnosed Afib with RVR: CHADs-vasc 4. Newly diagnosed on this admission. Converted to NSR with IV amiodarone. Will continue IV amio today as patient is NPO. -He is now tolerating p.o., will convert from IV to p.o. amiodarone. Plan 200 mg BID x 2 weeks then decrease dose to 200 mg daily -Continue apixaban (already on for DVT)  #Abdominal Distension: #Concern for gastric outlet Obstruction: Noted on KUB. Now s/p NGT placement. -Management per primary.  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 Prior cath in 2021 with diffuse disease (70% mid-LAD, 100% prox Lcx, 90% distal LAD). He is currently on HD for volume management. GDMT limited due to ESRD and hypotension. Will plan for ischemic work-up as outpatient if EF remains depressed -Cannot tolerate metop due to hypotension -No plan for cath this admission given acute illness, lack of chest pain, and underlying significant comorbidities; can reassess EF as outpatient and consider cath if remains depressed.   #CAD Cath in 2021 with 75% mLAD, 100% prox-mid  Lcz, 90% distal LAD. Was recommended for medical management at that time. Suspect trop rise on admission secondary to demand in the setting of sepsis and newly diagnosed systolic HF. Will not plan for cath on this admission given lack of anginal symptoms and sepsis secondary to pneumonia. Plan for continued medical management and can consider ischemic work-up as outpatient if EF remains depressed on TTE despite clinical improvement. -Will plan for repeat TTE as outpatient to reassess EF and if persistently depressed, plan for ischemic evaluation at that time  #Sepsis Secondary to Pneumonia: -Management per primary  #  HTN: Has been hypotensive. Holding meds.  #ESRD on HD: -Management per nephrology      For questions or updates, please contact [place] Please consult [email or web address] for contact info under        Signed, Donato Heinz, MD  10/01/2022, 12:02 PM

## 2022-10-01 NOTE — Progress Notes (Addendum)
Pharmacy Antibiotic Note  [name] is a 67 y.o. male for who has been admitted with pneumonia. Patient with a history of ESRD on HD TTS. Patient presenting with persistent productive cough. Pharmacy consulted to dose cefepime. Today is abx d#10 of 10 day course.  Pt has been slow to improve on abx and thus longer abx duration planned.  Also completed 3d steroids on 2/28. Pt afebrile, WBC 10.2.   Plan: Continue cefepime 1g IV q24h.  Dose appropriate for ESRD. Will sign off given last day of therapy   Height: '5\' 3"'$  (160 cm) Weight: 67.6 kg (149 lb 0.5 oz) IBW/kg (Calculated) : 56.9  Temp (24hrs), Avg:98.8 F (37.1 C), Min:98.5 F (36.9 C), Max:99.2 F (37.3 C)  Recent Labs  Lab 09/27/22 0035 09/28/22 0047 09/29/22 0030 09/30/22 0147 10/01/22 0112  WBC 9.7 14.0* 11.5* 9.0 10.2  CREATININE 5.40* 4.69* 6.86* 4.58* 6.66*     Estimated Creatinine Clearance: 9 mL/min (A) (by C-G formula based on SCr of 6.66 mg/dL (H)).    Allergies  Allergen Reactions   Tramadol Nausea And Vomiting    Pt states he took this on an empty stomach one time and it came right back up. He's not had it since.    Microbiology results: 2/22 Resp Panel: neg  2/22 MRSA PCR: neg  2/22 Bcx: turicella otitidis  Eliseo Gum, PharmD PGY1 Pharmacy Resident   10/01/2022  10:37 AM

## 2022-10-01 NOTE — Progress Notes (Addendum)
Daily Progress Note Intern Pager: [phone]  Patient name: [name] Medical record number: [id number] Date of birth: November 17, 1955 Age: 67 y.o. Gender: male  Primary Care Provider: Patient, No Pcp Per Consultants: Nephrology, Cards, Pulm Code Status: Full code  Pt Overview and Major Events to Date:  2/22: Admitted 2/29: Hypotensive, A-fib with RVR status post HD  Assessment and Plan: [name] is a 67 year old male presenting with cough and shortness of breath.  His admission was complicated by A-fib with RVR and started on amiodarone treatments which has since converted. Pertinent PMH/PSH includes ESRD on HD T/TH/SAT, heart failure, T2DM, PAD, unilateral BKA, hyperparathyroidism, HTN.   Pneumonia  Subjectively patient is improved. S/p 3 day course of solumedrol. Has tolerated weaning of O2 currently on 2L [place]. He remained afebrile and has good WOB. - RT eval and treat - Monitor resp status, wean O2 as tolerated - SPO2 > 92% - continue cefepime  (with last day 10/02/22) - Incentive spirometry  ESRD on dialysis [facility]) ESRD on HD T/Th/Sat. - nephrology following, appreciate recs  Heart failure, unspecified ([name] Day) Euvolemic on exam with stable weight. Managing volume with HD - losartan held for soft BPs -- AM CMP - Strict I's and O's - daily weights  DVT (deep venous thrombosis) (HCC) Patient noted to have age indeterminate DVT in right popliteal vein on admission. - continue apixaban 10 mg PO BID x 5 days; then 5 mg PO BID   NSTEMI (non-ST elevated myocardial infarction) ([name]) Asymptomatic. Not candidate for heart cath, will continue medical management with plan for ischemic work up outpatient. -Cardiology following, appreciate recs - continue apixaban 10 mg PO BID x 5 days; then 5 mg PO BID  - held metoprolol 25 mg given soft Bps and hypotension  Urinary retention Resolved. Last set of bladder scans were 73m. There were concerns for [name] or myasthenia  gravis given weakness, constipation, respiratory failure and urinary retention.  -f/u LRita [name], Ach receptor antibody  [name] ([name] NG tube out. Improving no N/V reported and positive bowel sound.  Constipation noted on CTAP and last reported BM 2/25. Will advance dirte slowly and give glycerine suppository and colace. -continue to monitor I/O.  -d/c NG tube -give glycerin suppository and Colace -Advance to liquid diet   Paroxysmal atrial fibrillation (HCC) Converted to NSR on IV Amiodarone. Will likely switch to PO med when he can tolerate PO.  Cardiology consulted. -cardiology following, appreciate recs -continue IV amiodarone -continue eliquis '5mg'$  BID -d/c ASA   Type 2 diabetes mellitus, controlled (HFortine CBG improving. 165 this morning. - CBG 4 times daily before meals and bedtime - on vsSSI    FEN/GI: N.p.o. PPx: Eliquis Dispo: [name]. Barriers include on supplemental oxygen.   Subjective:  Patient report he is doing well. No abdominal pain and breathing is improved. Reports no BM in days.  Objective: Temp:  [98.5 F (36.9 C)-98.9 F (37.2 C)] 98.5 F (36.9 C) (03/02 0740) Pulse Rate:  [91-98] 96 (03/02 0740) Resp:  [18-22] 22 (03/02 0740) BP: (122-135)/(63-71) 122/63 (03/02 0740) SpO2:  [89 %-99 %] 89 % (03/02 0740) Weight:  [67.6 kg] 67.6 kg (03/02 0000) Physical Exam: General: Alert, well appearing, NAD HEENT: Atraumatic, MMM, No sclera icterus CV: RRR, no murmurs, normal S1/S2 Pulm: diffused coarse breath sound, good WOB on 2L [place] Abd: Soft, no distension, no tenderness Skin: dry, warm Ext: No LE edema   Laboratory: Most recent CBC Lab Results  Component Value Date  WBC 10.2 10/01/2022   HGB 8.3 (L) 10/01/2022   HCT 25.6 (L) 10/01/2022   MCV 98.1 10/01/2022   PLT 211 10/01/2022   Most recent BMP    Latest Ref Rng & Units 10/01/2022    1:12 AM  BMP  Glucose 70 - 99 mg/dL 171   BUN 8 - 23 mg/dL 82   Creatinine 0.61 - 1.24 mg/dL  6.66   Sodium 135 - 145 mmol/L 139   Potassium 3.5 - 5.1 mmol/L 4.3   Chloride 98 - 111 mmol/L 91   CO2 22 - 32 mmol/L 24   Calcium 8.9 - 10.3 mg/dL 7.9     Imaging/Diagnostic Tests: No new images  Ian Bleacher, MD 10/01/2022, 11:32 AM  PGY-2, [place] Intern pager: [phone], text pages welcome Secure chat group [place]

## 2022-10-01 NOTE — Progress Notes (Signed)
   10/01/22 1815  Vitals  Temp 98.5 F (36.9 C)  Pulse Rate 100  Resp (!) 22  BP 115/63  O2 Device Nasal Cannula  Oxygen Therapy  O2 Flow Rate (L/min) 2 L/min  Post Treatment  Dialyzer Clearance Lightly streaked  Duration of HD Treatment -hour(s) 3.5 hour(s)  Liters Processed 84  Fluid Removed (mL) 1500 mL  AVG/AVF Arterial Site Held (minutes) 7 minutes  AVG/AVF Venous Site Held (minutes) 7 minutes   Received patient in bed to unit.  Alert and oriented.  Informed consent signed and in chart.   [place] duration:3.5  Patient tolerated well.  Transported back to the room  Alert, without acute distress.  Hand-off given to patient's nurse.   Access used: LUA AVF Access issues: none  Total UF removed: 1.5L Medication(s) given: none   [name]

## 2022-10-01 NOTE — Progress Notes (Signed)
[facility] Progress Note    Assessment/ Plan:   Sepsis / bilat PNA - on IV abx & steroids per pmd Acute systolic HF / NSTEMI - +trop 5500, s/p IV heparin per cardiology. LVEF 30-35% by echo here. Ischemic workup planned for outpatient AHRF - Uf'ing as tolerated A-fib-on Eliquis and IV Amio ESRD - on HD TTS. S/p extra HD treatment 2/26. back on TTS schedule-HD tomorrow HTN/ volume -now more on the hypotensive side, meds held.  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 Follow. 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 Cont po vdra.  Ileus: NGT is now out Afib w/ RVR-seen by cardio, on amio gtt. Seems to be in NSR now. On eliquis  OP HD: TTS G-O 3h 67mn  400/700   72.5kg  2/2.5 bath  LUA AVF  Hep 2000 - last HD 2/22, post wt 72.6kg   - rocaltrol 0.50 mcg po tiw - venofer 50 mg weekly IV - mircera 120 mcg IV q 4 wks, last 2/15, due 2/29  Subjective:   Patient seen and examined bedside.  No acute events.  He does endorse hunger.  Open for HD later today   Objective:   BP 122/63 (BP Location: Right Arm)   Pulse 96   Temp 98.5 F (36.9 C) (Oral)   Resp (!) 22   Ht '5\' 3"'$  (1.6 m)   Wt 67.6 kg   SpO2 (!) 89%   BMI 26.40 kg/m  No intake or output data in the 24 hours ending 10/01/22 0828  Weight change: -3.3 kg  Physical Exam: Gen: NAD CVS: RRR Resp: diminished air entry bibasilar, normal WOB Abd: soft Ext: left bka, no edema Neuro: awake, alert Dialysis access: LUE AVF +b/t  Imaging: CT ABDOMEN PELVIS WO CONTRAST  Result Date: 09/29/2022 CLINICAL DATA:  Abdominal pain. Ascites. EXAM: CT ABDOMEN AND PELVIS WITHOUT CONTRAST TECHNIQUE: Multidetector CT imaging of the abdomen and pelvis was performed following the standard protocol without IV contrast. RADIATION DOSE REDUCTION: This exam was performed according to the departmental dose-optimization program which includes automated exposure control, adjustment of the mA and/or  kV according to patient size and/or use of iterative reconstruction technique. COMPARISON:  Abdominopelvic CT 11/29/2019, chest CT 09/23/2022 FINDINGS: Lower chest: Motion artifact limitations. Right pleural effusion has near completely resolved. Left pleural effusion has improved. Residual bilateral airspace consolidation and ground-glass densities in the lingula. Coronary artery calcifications. Hepatobiliary: No focal liver abnormalities on this unenhanced exam. Elongated right lobe of the liver with small left lobe, likely Riedel's lobe configuration. Clips in the gallbladder fossa postcholecystectomy. No biliary dilatation. Pancreas: No ductal dilatation or inflammation. Spleen: Normal in size without focal abnormality. Adrenals/Urinary Tract: Normal adrenal glands. Bilateral renal parenchymal atrophy. Punctate nonobstructing stones versus vascular calcification at the hila. There is prominence of bilateral renal pelvises. Slight ureteral prominence. Moderate urinary bladder distension, volume = 914 cm^3. No bladder wall thickening. Stomach/Bowel: Enteric tube tip in the stomach. The stomach is partially distended. No gastric wall thickening. There is no small bowel obstruction or inflammation. Normal appendix. Moderate volume of stool throughout the colon. Scattered colonic diverticula without diverticulitis. Vascular/Lymphatic: Aortic atherosclerosis. No aneurysm. Moderate branch atherosclerosis. No bulky adenopathy. Reproductive: Prostate is unremarkable. Other: No ascites. Small fat containing umbilical hernia. Moderate fat containing left inguinal hernia. Musculoskeletal: Moderate facet hypertrophy with multilevel spondylosis, anterior spurring. There are no acute or suspicious osseous abnormalities. IMPRESSION: 1. Moderate urinary bladder distension, recommend correlation for  bladder outlet obstruction. 2. Moderate volume of stool throughout the colon, can be seen with constipation. Colonic diverticulosis  without diverticulitis. 3. No ascites. 4. Bilateral renal parenchymal atrophy. Punctate nonobstructing stones versus vascular calcification at the hila. 5. Near complete resolution of right pleural effusion and improved left pleural effusion since prior chest CT. Residual bilateral airspace consolidation and ground-glass densities in the lingula, likely representing improving pneumonia. Aortic Atherosclerosis (ICD10-I70.0). Electronically Signed   By: Keith Rake M.D.   On: 09/29/2022 22:35   DG Abd 1 View  Result Date: 09/29/2022 CLINICAL DATA:  NG tube placement EXAM: ABDOMEN - 1 VIEW COMPARISON:  None Available. FINDINGS: Esophagogastric tube with tip and side port below the diaphragm. Nonobstructive pattern of included bowel gas. IMPRESSION: Esophagogastric tube with tip and side port below the diaphragm. Electronically Signed   By: Delanna Ahmadi M.D.   On: 09/29/2022 18:36   DG Abd 1 View  Result Date: 09/29/2022 CLINICAL DATA:  Abdominal pain. EXAM: ABDOMEN - 1 VIEW COMPARISON:  September 19, 2013. FINDINGS: Moderate gastric distention is noted. No large or small bowel dilatation is noted. Mild amount of stool seen throughout the colon and rectum. No abnormal calcifications are noted. IMPRESSION: Moderate gastric distention is noted concerning for possible gastric outlet obstruction. Electronically Signed   By: Marijo Conception M.D.   On: 09/29/2022 15:49    Labs: BMET Recent Labs  Lab 09/25/22 0040 09/26/22 0045 09/27/22 0035 09/28/22 0047 09/29/22 0030 09/30/22 0147 10/01/22 0112  NA 133* 129* 132* 135 135 138 139  K 4.4 4.1 4.3 4.1 4.7 4.2 4.3  CL 92* 89* 90* 88* 90* 93* 91*  CO2 21* 22 21* '28 25 25 24  '$ GLUCOSE 132* 187* 246* 386* 328* 191* 171*  BUN 34* 60* 51* 63* 101* 59* 82*  CREATININE 4.80* 6.48* 5.40* 4.69* 6.86* 4.58* 6.66*  CALCIUM 8.1* 7.7* 8.4* 8.1* 8.1* 7.9* 7.9*  PHOS 4.5 4.6 4.6  --   --   --   --    CBC Recent Labs  Lab 09/28/22 0047 09/29/22 0030  09/30/22 0147 10/01/22 0112  WBC 14.0* 11.5* 9.0 10.2  HGB 9.6* 8.7* 8.0* 8.3*  HCT 28.8* 26.5* 25.9* 25.6*  MCV 96.0 97.1 99.6 98.1  PLT 220 203 202 211    Medications:     apixaban  5 mg Oral BID   atorvastatin  20 mg Oral Daily   calcitRIOL  0.5 mcg Oral Q T,Th,Sa-HD   Chlorhexidine Gluconate Cloth  6 each Topical Q0600   darbepoetin (ARANESP) injection - DIALYSIS  150 mcg Subcutaneous Q Thu-1800   insulin aspart  0-6 Units Subcutaneous TID WC   sevelamer carbonate  800 mg Oral TID WC      Gean Quint, MD [facility] 10/01/2022, 8:28 AM

## 2022-10-02 DIAGNOSIS — K567 Ileus, unspecified: Secondary | ICD-10-CM | POA: Diagnosis not present

## 2022-10-02 DIAGNOSIS — I5021 Acute systolic (congestive) heart failure: Secondary | ICD-10-CM | POA: Diagnosis not present

## 2022-10-02 DIAGNOSIS — J189 Pneumonia, unspecified organism: Secondary | ICD-10-CM | POA: Diagnosis not present

## 2022-10-02 DIAGNOSIS — I480 Paroxysmal atrial fibrillation: Secondary | ICD-10-CM | POA: Diagnosis not present

## 2022-10-02 LAB — BASIC METABOLIC PANEL
Anion gap: 24 — ABNORMAL HIGH (ref 5–15)
Anion gap: 18 — ABNORMAL HIGH (ref 5–15)
BUN: 82 mg/dL — ABNORMAL HIGH (ref 8–23)
BUN: 39 mg/dL — ABNORMAL HIGH (ref 8–23)
CO2: 24 mmol/L (ref 22–32)
CO2: 22 mmol/L (ref 22–32)
Calcium: 7.9 mg/dL — ABNORMAL LOW (ref 8.9–10.3)
Calcium: 7.9 mg/dL — ABNORMAL LOW (ref 8.9–10.3)
Chloride: 91 mmol/L — ABNORMAL LOW (ref 98–111)
Chloride: 95 mmol/L — ABNORMAL LOW (ref 98–111)
Creatinine, Ser: 6.66 mg/dL — ABNORMAL HIGH (ref 0.61–1.24)
Creatinine, Ser: 4.37 mg/dL — ABNORMAL HIGH (ref 0.61–1.24)
GFR, Estimated: 9 mL/min — ABNORMAL LOW (ref >60)
GFR, Estimated: 14 mL/min — ABNORMAL LOW (ref >60)
Glucose, Bld: 171 mg/dL — ABNORMAL HIGH (ref 70–99)
Glucose, Bld: 139 mg/dL — ABNORMAL HIGH (ref 70–99)
Potassium: 4.3 mmol/L (ref 3.5–5.1)
Potassium: 4.9 mmol/L (ref 3.5–5.1)
Sodium: 139 mmol/L (ref 135–145)
Sodium: 135 mmol/L (ref 135–145)

## 2022-10-02 LAB — GLUCOSE, CAPILLARY
Glucose-Capillary: 155 mg/dL — ABNORMAL HIGH (ref 70–99)
Glucose-Capillary: 193 mg/dL — ABNORMAL HIGH (ref 70–99)
Glucose-Capillary: 198 mg/dL — ABNORMAL HIGH (ref 70–99)
Glucose-Capillary: 179 mg/dL — ABNORMAL HIGH (ref 70–99)

## 2022-10-02 LAB — CBC
HCT: 26.6 % — ABNORMAL LOW (ref 39.0–52.0)
Hemoglobin: 8.6 g/dL — ABNORMAL LOW (ref 13.0–17.0)
MCH: 31.6 pg (ref 26.0–34.0)
MCHC: 32.3 g/dL (ref 30.0–36.0)
MCV: 97.8 fL (ref 80.0–100.0)
Platelets: 228 10*3/uL (ref 150–400)
RBC: 2.72 MIL/uL — ABNORMAL LOW (ref 4.22–5.81)
RDW: 15.6 % — ABNORMAL HIGH (ref 11.5–15.5)
WBC: 10.2 10*3/uL (ref 4.0–10.5)
nRBC: 0.2 % (ref 0.0–0.2)

## 2022-10-02 LAB — MAGNESIUM: Magnesium: 1.9 mg/dL (ref 1.7–2.4)

## 2022-10-02 MED ORDER — GLYCERIN (LAXATIVE) 2 G RE SUPP
1.0000 | Freq: Once | RECTAL | Status: DC
  Filled 2022-10-02: qty 1

## 2022-10-02 MED ORDER — SENNOSIDES-DOCUSATE SODIUM 8.6-50 MG PO TABS: 1.0000 | ORAL_TABLET | Freq: Every day | ORAL | Status: DC

## 2022-10-02 MED ORDER — SENNOSIDES-DOCUSATE SODIUM 8.6-50 MG PO TABS
1.0000 | ORAL_TABLET | Freq: Two times a day (BID) | ORAL | Status: DC
  Administered 2022-10-02 – 2022-10-03 (×3): 1 via ORAL
  Filled 2022-10-02 (×3): qty 1

## 2022-10-02 NOTE — Plan of Care (Signed)

## 2022-10-02 NOTE — Progress Notes (Signed)
Daily Progress Note Intern Pager: [phone]  Patient name: Xavi Brich Medical record number: [id number] Date of birth: [birth year] Age: 67 y.o. Gender: male  Primary Care Provider: Patient, No Pcp Per Consultants: Nephrology, cards, pulm Code Status: Full code  Pt Overview and Major Events to Date:  2/22- Admitted 2/29-hypotensive, A-fib with RVR s/p HD 2/29 - NG tube placed d/t ileus 3/1 - NG tube removed   Assessment and Plan: Klinton Aldridge is a 67 year old male presenting with cough and shortness of breath and found to have pneumonia.  His admission was complicated by A-fib with RVR and started on amiodarone treatments which has since converted.  Also complicated by DVT and NSTEMI and treated with apixaban. pertinent PMH/PSH includes ESRD on HD T/TH/SAT, heart failure, T2DM, PAD, unilateral BKA, hyperparathyroidism, HTN.   Pneumonia  Subjectively patient is improved. S/p 3 day course of solumedrol. Has tolerated weaning of O2 currently on 2L [place]. He remained afebrile and has good WOB. - RT eval and treat - Monitor resp status, wean O2 as tolerated - SPO2 > 92% - continue cefepime  (with last day 10/02/22) - Incentive spirometry  ESRD on dialysis [facility]) ESRD on HD T/Th/Sat. - nephrology following, appreciate recs  Heart failure, unspecified ([place]) Diagnosed this admission. Echo 09/23/2022 w/ EF 30-35%. Euvolemic on exam with stable weight. Cards following and plan for TTE outpt.  - losartan held for soft BPs -- AM BMP - Strict I's and O's - daily weights  DVT (deep venous thrombosis) (HCC) Patient noted to have age indeterminate DVT in right popliteal vein on admission. - continue apixaban 10 mg PO BID x 5 days; then 5 mg PO BID   Elevated troponin Per cards, likely demand ischemia and no need for cath at this time d/t HF and sepsis. Can consider ischemic work up outpatient if indicated. -Cardiology following, appreciate recs   Urinary retention Resolved. Last set  of bladder scans were 79m. There were concerns for Lambert-Eaton or myasthenia gravis given weakness, constipation, respiratory failure and urinary retention.  -f/u [facility], Ach receptor antibody  Ileus ([facility] NG tube out.  Denies any symptoms currently, still has not had BM.   -continue to monitor I/O.  -give glycerin suppository again today -Senna twice daily -Advance diet as tolerated   Paroxysmal atrial fibrillation (HCC) Converted to NSR on IV Amiodarone, now on oral amiodarone. -cardiology following, appreciate recs -continue oral amiodarone -continue eliquis '5mg'$  BID   Type 2 diabetes mellitus, controlled (HCC) CBG 175 this am. Received 3 U aspart yesteday - CBG 4 times daily before meals and bedtime - on vsSSI    FEN/GI: Advance diet as tolerated PPx: Eliquis Dispo: Pending continued medical management  Subjective:  Patient states he is feeling well this morning.  Denies any nausea, vomiting, abdominal pain.  States he has not had a bowel movement in quite some time.  Denies any shortness of breath.  Objective: Temp:  [98.3 F (36.8 C)-98.7 F (37.1 C)] 98.7 F (37.1 C) (03/03 0052) Pulse Rate:  [89-110] 105 (03/03 0100) Resp:  [19-25] 19 (03/03 0052) BP: (94-142)/(54-110) 120/68 (03/03 0054) SpO2:  [92 %-100 %] 96 % (03/03 0100) Weight:  [66.4 kg] 66.4 kg (03/03 0052) Physical Exam: General: 67year old male sitting up in bed, NAD Cardiovascular: RRR Respiratory: Coarse breath sounds throughout, normal work of breathing on room air Abdomen: Bowel sounds present, soft, nondistended, nontender palpation Extremities: No edema  Laboratory: Most recent CBC Lab Results  Component Value Date  WBC 10.2 10/02/2022   HGB 8.6 (L) 10/02/2022   HCT 26.6 (L) 10/02/2022   MCV 97.8 10/02/2022   PLT 228 10/02/2022   Most recent BMP    Latest Ref Rng & Units 10/01/2022    1:12 AM  BMP  Glucose 70 - 99 mg/dL 171   BUN 8 - 23 mg/dL 82   Creatinine 0.61 -  1.24 mg/dL 6.66   Sodium 135 - 145 mmol/L 139   Potassium 3.5 - 5.1 mmol/L 4.3   Chloride 98 - 111 mmol/L 91   CO2 22 - 32 mmol/L 24   Calcium 8.9 - 10.3 mg/dL 7.9      Precious Gilding, DO 10/02/2022, 7:54 AM  PGY-2, [place] Intern pager: [phone], text pages welcome Secure chat group [place]

## 2022-10-02 NOTE — Progress Notes (Signed)
Rounding Note    Patient Name: [name] Date of Encounter: 10/02/2022  [place] Cardiologist: Elouise Munroe, MD   Subjective   BP 113/56, remains in sinus rhythm.  Taking PO.  Denies any dyspnea  Inpatient Medications    Scheduled Meds:  amiodarone  200 mg Oral BID   Followed by   Derrill Memo ON 10/15/2022] amiodarone  200 mg Oral Daily   apixaban  5 mg Oral BID   atorvastatin  20 mg Oral Daily   calcitRIOL  0.5 mcg Oral Q T,Th,Sa-HD   Chlorhexidine Gluconate Cloth  6 each Topical Q0600   darbepoetin (ARANESP) injection - DIALYSIS  150 mcg Subcutaneous Q Thu-1800   Glycerin (Adult)  1 suppository Rectal Once   insulin aspart  0-6 Units Subcutaneous TID WC   senna-docusate  1 tablet Oral BID   sevelamer carbonate  800 mg Oral TID WC   Continuous Infusions:   PRN Meds:    Vital Signs    Vitals:   10/02/22 0052 10/02/22 0054 10/02/22 0100 10/02/22 0730  BP: 120/68 120/68  (!) 113/56  Pulse: (!) 110 (!) 109 (!) 105 (!) 102  Resp: 19   (!) 24  Temp: 98.7 F (37.1 C)   98.5 F (36.9 C)  TempSrc: Oral   Oral  SpO2: 98% 98% 96% (!) 88%  Weight: 66.4 kg     Height:        Intake/Output Summary (Last 24 hours) at 10/02/2022 1111 Last data filed at 10/02/2022 0831 Gross per 24 hour  Intake 800 ml  Output 1500 ml  Net -700 ml       10/02/2022   12:52 AM 10/01/2022   12:00 AM 09/30/2022    8:57 AM  Last 3 Weights  Weight (lbs) 146 lb 6.2 oz 149 lb 0.5 oz 146 lb 2.6 oz  Weight (kg) 66.4 kg 67.6 kg 66.3 kg      Telemetry    NSR - Personally Reviewed  ECG    No new tracing - Personally Reviewed  Physical Exam   GEN: Comfortable, NAD Neck: No JVD Cardiac: RRR, 2/6 systolic murmur Respiratory: Diminished but clear GI: Distended, soft, NTTP MS: Left BKA. No edema on right Neuro:  Nonfocal  Psych: Normal affect   Labs    High Sensitivity Troponin:   Recent Labs  Lab 09/23/22 1238 09/23/22 1424 09/29/22 1509 09/29/22 1740  TROPONINIHS  5,494* 6,320* 1,001* 1,048*      Chemistry Recent Labs  Lab 09/26/22 0045 09/27/22 0035 09/28/22 0047 09/30/22 0147 10/01/22 0112 10/02/22 0714  NA 129* 132*   < > 138 139 135  K 4.1 4.3   < > 4.2 4.3 4.9  CL 89* 90*   < > 93* 91* 95*  CO2 22 21*   < > '25 24 22  '$ GLUCOSE 187* 246*   < > 191* 171* 139*  BUN 60* 51*   < > 59* 82* 39*  CREATININE 6.48* 5.40*   < > 4.58* 6.66* 4.37*  CALCIUM 7.7* 8.4*   < > 7.9* 7.9* 7.9*  MG  --   --   --  1.6* 2.5* 1.9  ALBUMIN 2.1* 2.2*  --   --   --   --   GFRNONAA 9* 11*   < > 14* 9* 14*  ANIONGAP 18* 21*   < > 20* 24* 18*   < > = values in this interval not displayed.     Lipids No results  for input(s): "CHOL", "TRIG", "HDL", "LABVLDL", "LDLCALC", "CHOLHDL" in the last 168 hours.  Hematology Recent Labs  Lab 09/30/22 0147 10/01/22 0112 10/02/22 0106  WBC 9.0 10.2 10.2  RBC 2.60* 2.61* 2.72*  HGB 8.0* 8.3* 8.6*  HCT 25.9* 25.6* 26.6*  MCV 99.6 98.1 97.8  MCH 30.8 31.8 31.6  MCHC 30.9 32.4 32.3  RDW 15.6* 15.6* 15.6*  PLT 202 211 228    Thyroid  Recent Labs  Lab 09/30/22 0147  TSH 1.458     BNP Recent Labs  Lab 09/27/22 0035  BNP 3,505.9*     DDimer No results for input(s): "DDIMER" in the last 168 hours.   Radiology    No results found.  Cardiac Studies   TTE 09/23/22: IMPRESSIONS     1. There is severe hypokinesis of the mid-to-apical septal, mid-to-apical  anterior, and mid-to-apical inferior LV segments and apex. No LV thrombus  visualized. Findings concerning for possible LAD disease vs takostubo  cardiomyopathy. Left ventricular  ejection fraction, by estimation, is 30 to 35%. The left ventricle has  moderately decreased function. The left ventricle demonstrates regional  wall motion abnormalities (see scoring diagram/findings for description).  Left ventricular diastolic parameters   are consistent with Grade II diastolic dysfunction (pseudonormalization).  The E/e' is 17.2.   2. Right ventricular  systolic function is mildly reduced. The right  ventricular size is normal.   3. Left atrial size was mildly dilated.   4. The mitral valve is grossly normal. Mild mitral valve regurgitation.   5. The aortic valve is tricuspid. Aortic valve regurgitation is not  visualized. Aortic valve sclerosis is present, with no evidence of aortic  valve stenosis.   6. The inferior vena cava is dilated in size with <50% respiratory  variability, suggesting right atrial pressure of 15 mmHg.   Comparison(s): Compared to prior TTE in 2021, the EF has dropped from  50-55% to 30-35% with WMA as detailed above.   Patient Profile     67 y.o. male with ESRD on HD with prior BKA, PAD, DMII, HTN who was admitted with sepsis secondary to pneumonia with course complicated by newly diagnosed systolic HF and new AFib for which Cardiology was consulted.   Assessment & Plan    #Newly Diagnosed Afib with RVR: CHADs-vasc 4. Newly diagnosed on this admission. Converted to NSR with IV amiodarone.  -He is now tolerating p.o. now, converted from IV to p.o. amiodarone. Plan 200 mg BID x 2 weeks then decrease dose to 200 mg daily -Continue apixaban (already on for DVT)  #Abdominal Distension: #Concern for gastric outlet Obstruction: Noted on KUB. Now s/p NGT placement. -Management per primary.  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 Prior cath in 2021 with diffuse disease (70% mid-LAD, 100% prox Lcx, 90% distal LAD). He is currently on HD for volume management. GDMT limited due to ESRD and hypotension. Will plan for ischemic work-up as outpatient if EF remains depressed -Cannot tolerate metop due to hypotension -No plan for cath this admission given acute illness, lack of chest pain, and underlying significant  comorbidities; can reassess EF as outpatient and consider cath if remains depressed.   #CAD Cath in 2021 with 75% mLAD, 100% prox-mid Lcz, 90% distal LAD. Was recommended for medical management at that time. Suspect trop rise on admission secondary  to demand in the setting of sepsis and newly diagnosed systolic HF. Will not plan for cath on this admission given lack of anginal symptoms and sepsis secondary to pneumonia. Plan for continued medical management and can consider ischemic work-up as outpatient if EF remains depressed on TTE despite clinical improvement. -Will plan for repeat TTE as outpatient to reassess EF and if persistently depressed, plan for ischemic evaluation at that time  #Sepsis Secondary to Pneumonia: -Management per primary  #HTN: Has been hypotensive. Holding meds.  BP improving  #ESRD on HD: -Management per nephrology      For questions or updates, please contact [place] Please consult [email or web address] for contact info under        Signed, Donato Heinz, MD  10/02/2022, 11:11 AM

## 2022-10-02 NOTE — Progress Notes (Signed)
[facility] Progress Note    Assessment/ Plan:   Sepsis / bilat PNA - per primary. S/p cefepime (finish 3/3) and 3 day course of solumedrol Acute systolic HF / NSTEMI - +trop 5500, s/p IV heparin per cardiology. LVEF 30-35% by echo here. Ischemic workup planned for outpatient AHRF - Uf'ing as tolerated. Don't think volume is an issue now at this junction A-fib-on Eliquis and PO amio ESRD - on HD TTS. S/p extra HD treatment 2/26. back on TTS schedule-HD Tues HTN/ volume -now more on the hypotensive side, meds held.  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 Follow. 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 Cont po vdra.  Ileus: NGT is now out Afib w/ RVR-seen by cardio, on amio gtt. Seems to be in NSR now. On eliquis  OP HD: TTS G-O 3h 59mn  400/700   72.5kg  2/2.5 bath  LUA AVF  Hep 2000 - last HD 2/22, post wt 72.6kg   - rocaltrol 0.50 mcg po tiw - venofer 50 mg weekly IV - mircera 120 mcg IV q 4 wks, last 2/15, due 2/29  Subjective:   Patient seen and examined bedside.  No acute events.  No complaints, eating breakfast. Tolerated HD yesterday with net uf 1.5L   Objective:   BP (!) 113/56 (BP Location: Right Arm)   Pulse (!) 102   Temp 98.5 F (36.9 C) (Oral)   Resp (!) 24   Ht '5\' 3"'$  (1.6 m)   Wt 66.4 kg   SpO2 (!) 88%   BMI 25.93 kg/m   Intake/Output Summary (Last 24 hours) at 10/02/2022 0836 Last data filed at 10/02/2022 0831 Gross per 24 hour  Intake 800 ml  Output 1500 ml  Net -700 ml    Weight change: 0.1 kg  Physical Exam: Gen: NAD CVS: RRR Resp: diminished air entry bibasilar, normal WOB Abd: soft Ext: left bka, no edema Neuro: awake, alert Dialysis access: LUE AVF +b/t  Imaging: No results found.  Labs: BMET Recent Labs  Lab 09/26/22 0045 09/27/22 0035 09/28/22 0047 09/29/22 0030 09/30/22 0147 10/01/22 0112  NA 129* 132* 135 135 138 139  K 4.1 4.3 4.1 4.7 4.2 4.3  CL 89* 90* 88* 90* 93* 91*   CO2 22 21* '28 25 25 24  '$ GLUCOSE 187* 246* 386* 328* 191* 171*  BUN 60* 51* 63* 101* 59* 82*  CREATININE 6.48* 5.40* 4.69* 6.86* 4.58* 6.66*  CALCIUM 7.7* 8.4* 8.1* 8.1* 7.9* 7.9*  PHOS 4.6 4.6  --   --   --  5.7*   CBC Recent Labs  Lab 09/29/22 0030 09/30/22 0147 10/01/22 0112 10/02/22 0106  WBC 11.5* 9.0 10.2 10.2  HGB 8.7* 8.0* 8.3* 8.6*  HCT 26.5* 25.9* 25.6* 26.6*  MCV 97.1 99.6 98.1 97.8  PLT 203 202 211 228    Medications:     amiodarone  200 mg Oral BID   Followed by   [Derrill MemoON 10/15/2022] amiodarone  200 mg Oral Daily   apixaban  5 mg Oral BID   atorvastatin  20 mg Oral Daily   calcitRIOL  0.5 mcg Oral Q T,Th,Sa-HD   Chlorhexidine Gluconate Cloth  6 each Topical Q0600   darbepoetin (ARANESP) injection - DIALYSIS  150 mcg Subcutaneous Q Thu-1800   Glycerin (Adult)  1 suppository Rectal Once   insulin aspart  0-6 Units Subcutaneous TID WC   senna-docusate  1 tablet Oral BID   sevelamer  carbonate  800 mg Oral TID WC      Gean Quint, MD [facility] 10/02/2022, 8:36 AM

## 2022-10-03 DIAGNOSIS — N186 End stage renal disease: Secondary | ICD-10-CM | POA: Diagnosis not present

## 2022-10-03 DIAGNOSIS — I824Z9 Acute embolism and thrombosis of unspecified deep veins of unspecified distal lower extremity: Secondary | ICD-10-CM | POA: Diagnosis not present

## 2022-10-03 DIAGNOSIS — J189 Pneumonia, unspecified organism: Secondary | ICD-10-CM | POA: Diagnosis not present

## 2022-10-03 DIAGNOSIS — I4891 Unspecified atrial fibrillation: Secondary | ICD-10-CM | POA: Diagnosis not present

## 2022-10-03 DIAGNOSIS — I5021 Acute systolic (congestive) heart failure: Secondary | ICD-10-CM | POA: Diagnosis not present

## 2022-10-03 DIAGNOSIS — K567 Ileus, unspecified: Secondary | ICD-10-CM | POA: Diagnosis not present

## 2022-10-03 DIAGNOSIS — K560 Paralytic ileus: Secondary | ICD-10-CM | POA: Diagnosis not present

## 2022-10-03 LAB — CBC
HCT: 24.6 % — ABNORMAL LOW (ref 39.0–52.0)
Hemoglobin: 7.8 g/dL — ABNORMAL LOW (ref 13.0–17.0)
MCH: 31.3 pg (ref 26.0–34.0)
MCHC: 31.7 g/dL (ref 30.0–36.0)
MCV: 98.8 fL (ref 80.0–100.0)
Platelets: 224 10*3/uL (ref 150–400)
RBC: 2.49 MIL/uL — ABNORMAL LOW (ref 4.22–5.81)
RDW: 15.1 % (ref 11.5–15.5)
WBC: 11.8 10*3/uL — ABNORMAL HIGH (ref 4.0–10.5)
nRBC: 0 % (ref 0.0–0.2)

## 2022-10-03 LAB — GLUCOSE, CAPILLARY
Glucose-Capillary: 173 mg/dL — ABNORMAL HIGH (ref 70–99)
Glucose-Capillary: 236 mg/dL — ABNORMAL HIGH (ref 70–99)
Glucose-Capillary: 139 mg/dL — ABNORMAL HIGH (ref 70–99)
Glucose-Capillary: 155 mg/dL — ABNORMAL HIGH (ref 70–99)

## 2022-10-03 LAB — BASIC METABOLIC PANEL
Anion gap: 15 (ref 5–15)
BUN: 59 mg/dL — ABNORMAL HIGH (ref 8–23)
CO2: 27 mmol/L (ref 22–32)
Calcium: 8.1 mg/dL — ABNORMAL LOW (ref 8.9–10.3)
Chloride: 91 mmol/L — ABNORMAL LOW (ref 98–111)
Creatinine, Ser: 5.97 mg/dL — ABNORMAL HIGH (ref 0.61–1.24)
GFR, Estimated: 10 mL/min — ABNORMAL LOW (ref >60)
Glucose, Bld: 157 mg/dL — ABNORMAL HIGH (ref 70–99)
Potassium: 4 mmol/L (ref 3.5–5.1)
Sodium: 133 mmol/L — ABNORMAL LOW (ref 135–145)

## 2022-10-03 LAB — MAGNESIUM: Magnesium: 1.9 mg/dL (ref 1.7–2.4)

## 2022-10-03 LAB — HEMOGLOBIN AND HEMATOCRIT, BLOOD
HCT: 24.7 % — ABNORMAL LOW (ref 39.0–52.0)
Hemoglobin: 8.1 g/dL — ABNORMAL LOW (ref 13.0–17.0)

## 2022-10-03 MED ORDER — POLYETHYLENE GLYCOL 3350 17 G PO PACK
17.0000 g | PACK | Freq: Every day | ORAL | Status: DC
  Administered 2022-10-03 – 2022-10-04 (×2): 17 g via ORAL
  Filled 2022-10-03 (×2): qty 1

## 2022-10-03 MED ORDER — SENNOSIDES-DOCUSATE SODIUM 8.6-50 MG PO TABS
2.0000 | ORAL_TABLET | Freq: Two times a day (BID) | ORAL | Status: DC
  Administered 2022-10-03 – 2022-10-05 (×4): 2 via ORAL
  Filled 2022-10-03 (×4): qty 2

## 2022-10-03 MED ORDER — LACTULOSE 10 GM/15ML PO SOLN
10.0000 g | Freq: Once | ORAL | Status: AC
  Administered 2022-10-03: 10 g via ORAL
  Filled 2022-10-03: qty 15

## 2022-10-03 MED ORDER — GUAIFENESIN-DM 100-10 MG/5ML PO SYRP
5.0000 mL | ORAL_SOLUTION | ORAL | Status: DC | PRN
  Administered 2022-10-03: 5 mL via ORAL
  Filled 2022-10-03: qty 5

## 2022-10-03 MED ORDER — CHLORHEXIDINE GLUCONATE CLOTH 2 % EX PADS
6.0000 | MEDICATED_PAD | Freq: Every day | CUTANEOUS | Status: DC
  Administered 2022-10-04 – 2022-10-05 (×2): 6 via TOPICAL

## 2022-10-03 MED ORDER — ORAL CARE MOUTH RINSE: 15.0000 mL | OROMUCOSAL | Status: DC | PRN

## 2022-10-03 NOTE — Progress Notes (Signed)
Rounding Note    Patient Name: [name] Date of Encounter: 10/03/2022  [place] Cardiologist: Elouise Munroe, MD   Subjective   BP 113/56, remains in sinus rhythm.  Taking PO.  Feel back to baseline. This AM has [employer] Breakfast.  Inpatient Medications    Scheduled Meds:  amiodarone  200 mg Oral BID   Followed by   Derrill Memo ON 10/15/2022] amiodarone  200 mg Oral Daily   apixaban  5 mg Oral BID   atorvastatin  20 mg Oral Daily   calcitRIOL  0.5 mcg Oral Q T,Th,Sa-HD   Chlorhexidine Gluconate Cloth  6 each Topical Q0600   darbepoetin (ARANESP) injection - DIALYSIS  150 mcg Subcutaneous Q Thu-1800   Glycerin (Adult)  1 suppository Rectal Once   insulin aspart  0-6 Units Subcutaneous TID WC   senna-docusate  1 tablet Oral BID   sevelamer carbonate  800 mg Oral TID WC   Continuous Infusions:   PRN Meds:    Vital Signs    Vitals:   10/02/22 2359 10/03/22 0500 10/03/22 0607 10/03/22 0739  BP: 114/63   123/65  Pulse: 94   92  Resp: '18  16 17  '$ Temp: 98.5 F (36.9 C)  98.5 F (36.9 C) 98.5 F (36.9 C)  TempSrc: Oral  Oral Oral  SpO2: 98%   99%  Weight: 69.1 kg 69.1 kg    Height:        Intake/Output Summary (Last 24 hours) at 10/03/2022 0757 Last data filed at 10/02/2022 1950 Gross per 24 hour  Intake 1060 ml  Output --  Net 1060 ml      10/03/2022    5:00 AM 10/02/2022   11:59 PM 10/02/2022   12:52 AM  Last 3 Weights  Weight (lbs) 152 lb 5.4 oz 152 lb 5.4 oz 146 lb 6.2 oz  Weight (kg) 69.1 kg 69.1 kg 66.4 kg      Telemetry    NSR to sinus tachycardia- Personally Reviewed  ECG    No new tracing - Personally Reviewed  Physical Exam   GEN: Comfortable, NAD Neck: No JVD Cardiac: RRR, 2/6 systolic murmur Respiratory: decreased breath sounds in bases GI: Distended, soft, non tender MS: Left BKA. Right TMA Neuro:  Nonfocal  Psych: Normal affect   Labs    High Sensitivity Troponin:   Recent Labs  Lab 09/23/22 1238 09/23/22 1424  09/29/22 1509 09/29/22 1740  TROPONINIHS 5,494* 6,320* 1,001* 1,048*     Chemistry Recent Labs  Lab 09/27/22 0035 09/28/22 0047 10/01/22 0112 10/02/22 0714 10/03/22 0029  NA 132*   < > 139 135 133*  K 4.3   < > 4.3 4.9 4.0  CL 90*   < > 91* 95* 91*  CO2 21*   < > '24 22 27  '$ GLUCOSE 246*   < > 171* 139* 157*  BUN 51*   < > 82* 39* 59*  CREATININE 5.40*   < > 6.66* 4.37* 5.97*  CALCIUM 8.4*   < > 7.9* 7.9* 8.1*  MG  --    < > 2.5* 1.9 1.9  ALBUMIN 2.2*  --   --   --   --   GFRNONAA 11*   < > 9* 14* 10*  ANIONGAP 21*   < > 24* 18* 15   < > = values in this interval not displayed.    Lipids No results for input(s): "CHOL", "TRIG", "HDL", "LABVLDL", "LDLCALC", "CHOLHDL" in the last 168 hours.  Hematology Recent Labs  Lab 10/01/22 0112 10/02/22 0106 10/03/22 0029 10/03/22 0630  WBC 10.2 10.2 11.8*  --   RBC 2.61* 2.72* 2.49*  --   HGB 8.3* 8.6* 7.8* 8.1*  HCT 25.6* 26.6* 24.6* 24.7*  MCV 98.1 97.8 98.8  --   MCH 31.8 31.6 31.3  --   MCHC 32.4 32.3 31.7  --   RDW 15.6* 15.6* 15.1  --   PLT 211 228 224  --    Thyroid  Recent Labs  Lab 09/30/22 0147  TSH 1.458    BNP Recent Labs  Lab 09/27/22 0035  BNP 3,505.9*    DDimer No results for input(s): "DDIMER" in the last 168 hours.   Radiology    No results found.  Cardiac Studies  Cardiac Studies & Procedures   CARDIAC CATHETERIZATION  CARDIAC CATHETERIZATION 12/13/2019  Narrative  RPDA lesion is 50% stenosed.  Mid LM to Dist LM lesion is 50% stenosed.  Prox Cx to Mid Cx lesion is 100% stenosed.  Mid LAD lesion is 75% stenosed.  Dist LAD-1 lesion is 50% stenosed.  Dist LAD-2 lesion is 90% stenosed.  LV end diastolic pressure is moderately elevated.  There is no aortic valve stenosis.  Diffuse CAD. He denied angina. He has had DOE.  Hemodynamic findings consistent with moderate pulmonary hypertension.  Ao sat 95%, PA sat 62%, mean PA pressure 36 mm Hg; mean PCWP 23 mm Hg; CO 7.0 L/min; CI  4  Moderate CAD.  Cardiomyopathy seems out of proportion to CAD, but ischemia may be a component of the LV dysfunction.  Elevated LVEDP.  Patient lying on a wedge due to shortness of breath.  Continue medical therapy for LV dysfunction.  He did not want any family notified.  Findings Coronary Findings Diagnostic  Dominance: Right  Left Main Mid LM to Dist LM lesion is 50% stenosed.  Left Anterior Descending There is mild diffuse disease throughout the vessel. Mid LAD lesion is 75% stenosed. Dist LAD-1 lesion is 50% stenosed. Dist LAD-2 lesion is 90% stenosed.  Left Circumflex Prox Cx to Mid Cx lesion is 100% stenosed.  Right Coronary Artery There is mild diffuse disease throughout the vessel.  Right Posterior Descending Artery RPDA lesion is 50% stenosed.  Intervention  No interventions have been documented.   STRESS TESTS  NM MYOCAR MULTI W/SPECT W 12/06/2019  Narrative  There was no ST segment deviation noted during stress.  Defect 1: There is a large defect of severe severity present in the basal inferolateral, mid inferolateral, apical septal, apical lateral and apex location.  Findings consistent with ischemia.  Nuclear stress EF: 31%.  This is a high risk study.  The left ventricular ejection fraction is moderately decreased (30-44%).  No T wave inversion was noted during stress.  Large size, severe intensity apical and inferolateral perfusion defect which is reversible (SDS 7), suggestive of possible LCx territory ischemia. LVEF 31% with severe global hypokinesis and inferolateral hypokinesis to akinesis. This is a high risk study.   ECHOCARDIOGRAM  ECHOCARDIOGRAM COMPLETE 09/23/2022  Narrative ECHOCARDIOGRAM REPORT    Patient Name:   [name] Date of Exam: 09/23/2022 Medical Rec #:  [id number]    Height:       63.0 in Accession #:    [id number]   Weight:       161.4 lb Date of Birth:  [birth year]   BSA:          1.765 m Patient Age:    67  years     BP:           110/63 mmHg Patient Gender: M            HR:           100 bpm. Exam Location:  Inpatient  Procedure: 2D Echo, Cardiac Doppler, Color Doppler and Intracardiac Opacification Agent  Indications:    Dyspnea R06.00  History:        Patient has prior history of Echocardiogram examinations, most recent 04/10/2020. CHF and Cardiomyopathy, PAD; Risk Factors:Hypertension and Diabetes. ESRD.  Sonographer:    Ronny Flurry Referring Phys: [place]   1. There is severe hypokinesis of the mid-to-apical septal, mid-to-apical anterior, and mid-to-apical inferior LV segments and apex. No LV thrombus visualized. Findings concerning for possible LAD disease vs takostubo cardiomyopathy. Left ventricular ejection fraction, by estimation, is 30 to 35%. The left ventricle has moderately decreased function. The left ventricle demonstrates regional wall motion abnormalities (see scoring diagram/findings for description). Left ventricular diastolic parameters are consistent with Grade II diastolic dysfunction (pseudonormalization). The E/e' is 17.2. 2. Right ventricular systolic function is mildly reduced. The right ventricular size is normal. 3. Left atrial size was mildly dilated. 4. The mitral valve is grossly normal. Mild mitral valve regurgitation. 5. The aortic valve is tricuspid. Aortic valve regurgitation is not visualized. Aortic valve sclerosis is present, with no evidence of aortic valve stenosis. 6. The inferior vena cava is dilated in size with <50% respiratory variability, suggesting right atrial pressure of 15 mmHg.  Comparison(s): Compared to prior TTE in 2021, the EF has dropped from 50-55% to 30-35% with WMA as detailed above.  FINDINGS Left Ventricle: There is severe hypokinesis of the mid-to-apical septal, mid-to-apical anterior, and mid-to-apical inferior LV segments and apex. No LV thrombus visualized. Findings concerning for possible  LAD disease vs takostubo cardiomyopathy. Left ventricular ejection fraction, by estimation, is 30 to 35%. The left ventricle has moderately decreased function. The left ventricle demonstrates regional wall motion abnormalities. Definity contrast agent was given IV to delineate the left ventricular endocardial borders. The left ventricular internal cavity size was normal in size. There is no left ventricular hypertrophy. Left ventricular diastolic parameters are consistent with Grade II diastolic dysfunction (pseudonormalization). The E/e' is 17.2.  Right Ventricle: The right ventricular size is normal. No increase in right ventricular wall thickness. Right ventricular systolic function is mildly reduced.  Left Atrium: Left atrial size was mildly dilated.  Right Atrium: Right atrial size was normal in size.  Pericardium: There is no evidence of pericardial effusion.  Mitral Valve: The mitral valve is grossly normal. There is mild calcification of the mitral valve leaflet(s). Mild mitral valve regurgitation.  Tricuspid Valve: The tricuspid valve is normal in structure. Tricuspid valve regurgitation is trivial.  Aortic Valve: The aortic valve is tricuspid. Aortic valve regurgitation is not visualized. Aortic valve sclerosis is present, with no evidence of aortic valve stenosis. Aortic valve mean gradient measures 4.0 mmHg. Aortic valve peak gradient measures 7.7 mmHg. Aortic valve area, by VTI measures 2.00 cm.  Pulmonic Valve: The pulmonic valve was normal in structure. Pulmonic valve regurgitation is trivial.  Aorta: The aortic root and ascending aorta are structurally normal, with no evidence of dilitation.  Venous: The inferior vena cava is dilated in size with less than 50% respiratory variability, suggesting right atrial pressure of 15 mmHg.  IAS/Shunts: The atrial septum is grossly normal.  Additional Comments: There is a small pleural effusion in  the left lateral region.   LEFT  VENTRICLE PLAX 2D LVIDd:         4.40 cm   Diastology LVIDs:         2.90 cm   LV e' medial:    8.02 cm/s LV PW:         1.10 cm   LV E/e' medial:  17.0 LV IVS:        0.90 cm   LV e' lateral:   8.06 cm/s LVOT diam:     1.80 cm   LV E/e' lateral: 16.9 LV SV:         48 LV SV Index:   27 LVOT Area:     2.54 cm   RIGHT VENTRICLE             IVC RV S prime:     11.70 cm/s  IVC diam: 2.00 cm TAPSE (M-mode): 1.6 cm  LEFT ATRIUM           Index        RIGHT ATRIUM           Index LA diam:      3.90 cm 2.21 cm/m   RA Area:     13.50 cm LA Vol (A2C): 52.4 ml 29.69 ml/m  RA Volume:   31.30 ml  17.73 ml/m LA Vol (A4C): 59.6 ml 33.77 ml/m AORTIC VALVE AV Area (Vmax):    1.95 cm AV Area (Vmean):   1.88 cm AV Area (VTI):     2.00 cm AV Vmax:           139.00 cm/s AV Vmean:          98.200 cm/s AV VTI:            0.239 m AV Peak Grad:      7.7 mmHg AV Mean Grad:      4.0 mmHg LVOT Vmax:         106.25 cm/s LVOT Vmean:        72.425 cm/s LVOT VTI:          0.188 m LVOT/AV VTI ratio: 0.79  AORTA Ao Root diam: 2.90 cm Ao Asc diam:  3.20 cm  MITRAL VALVE MV Area (PHT): 7.59 cm     SHUNTS MV Decel Time: 100 msec     Systemic VTI:  0.19 m MV E velocity: 136.00 cm/s  Systemic Diam: 1.80 cm MV A velocity: 67.90 cm/s MV E/A ratio:  2.00  Gwyndolyn Kaufman MD Electronically signed by Gwyndolyn Kaufman MD Signature Date/Time: 09/23/2022/1:03:36 PM    Final              Patient Profile     67 y.o. male with ESRD on HD with prior BKA, PAD, DMII, HTN who was admitted with sepsis secondary to pneumonia with course complicated by newly diagnosed systolic HF and new AFib for which Cardiology was consulted.   Assessment & Plan    #Newly Diagnosed Afib with RVR: - CHADs-vasc 4. Newly diagnosed on this admission. Converted to NSR with IV amiodarone.  - had PO amiodarone stated  #Acute Systolic HF (combined): - He is currently on HD for volume management.  - GDMT limited due  to ESRD and hypotension.  - see prior notes: planned for ischemic work-up as outpatient if EF remains depressed -Cannot tolerate metop due to hypotension - discussed dietary sodium  #NSTEMI Type II suspected - Cath in 2021 with 75% mLAD, 100% prox-mid Lcz, 90% distal LAD. (  Was recommended for medical management at that time) - see prior notes; asymptomatic planned for outpatient evaluation  #Abdominal Distension: #Concern for gastric outlet Obstruction: -Management per primary.   #Sepsis Secondary to Pneumonia: -Management per primary  #HTN: Has been hypotensive. Holding meds  #ESRD on HD: -Management per nephrology  #PAD: - continue statin and [place] will sign off.   Medication Recommendations:  Eliquis 5 mg amiodarone as written for Other recommendations (labs, testing, etc):  echo as outpatient Follow up as an outpatient:  10/05/22 f/u scheduled      For questions or updates, please contact [place] Please consult [email or web address] for contact info under        Signed, Werner Lean, MD  10/03/2022, 7:57 AM

## 2022-10-03 NOTE — Assessment & Plan Note (Addendum)
Hgb 9.6, stable. -Transfusion threshold 8

## 2022-10-03 NOTE — Progress Notes (Addendum)
Daily Progress Note Intern Pager: [phone]  Patient name: [name] Medical record number: [id number] Date of birth: 03/03/1956 Age: 67 y.o. Gender: male  Primary Care Provider: Patient, No Pcp Per Consultants: Nephrology, cards, pulm Code Status: Full code   Pt Overview and Major Events to Date:  2/22: Admitted to FMTS 2/29: Hypotensive, A-fib with RVR s/p HD 2/29: NG tube placed d/t ileus 3/1: NG tube removed   Assessment and Plan: [name] is a 67 year old male presenting with cough and shortness of breath and found to have pneumonia.  His admission was complicated by A-fib with RVR and started on amiodarone treatments which has since converted.  Also complicated by DVT and NSTEMI and treated with apixaban. pertinent PMH/PSH includes ESRD on HD T/TH/SAT, heart failure, T2DM, PAD, unilateral BKA, hyperparathyroidism, HTN.   Pneumonia Weaned to 1L [employer]. Breathing continues to improve. Consider sending home on oxygen. S/p Cefepime. - Monitor resp status, wean O2 as tolerated - SPO2 > 92% - Incentive spirometry -f/u Rita Ohara labs, Ach receptor antibody  ESRD on dialysis [facility]) ESRD on HD T/Th/Sat. - nephrology following, appreciate recs  Heart failure, unspecified ([place]) UOP and weight stable. BP stable. Consider adding GDMT as outpatient per Cards. Repeat Echo outpatient. -Strict I&O -Daily weights  DVT (deep venous thrombosis) (HCC) Asymptomatic. Stable on Eliquis.  Ileus ([place]) Reports passing gas but no reported BM. Refused suppository. -Monitor I/O -Advance to heart healthy diet -Increase to Miralax daily and Senna two pills BID  Paroxysmal atrial fibrillation (HCC) NSR with regular rate. -Cardiology following, appreciate recs -Continue oral amiodarone 200 mg BID x 2 weeks then decrease dose to 200 mg daily  -Continue eliquis '5mg'$  BID  Anemia Hgb 7.8, repeat H&H showed Hgb 8.1. Patient stable between 8-9 during admission. -Transfusion threshold  8  Type 2 diabetes mellitus, controlled (HCC) BGL stable in 100s. A1c 5.6 -vsSSI    FEN/GI: Soft diet PPx: Eliquis Dispo: Pending continued medical management  Subjective:  Patient assessed at bedside, eating breakfast. States he feels fine on 1L [place]. Endorses passing gas but states he still has not had a bowel movement. Patient states he refuses to have any suppositories to help him stool.  Objective: Temp:  [98.2 F (36.8 C)-98.5 F (36.9 C)] 98.3 F (36.8 C) (03/04 1040) Pulse Rate:  [48-99] 97 (03/04 1040) Resp:  [16-19] 19 (03/04 1040) BP: (112-123)/(59-65) 116/59 (03/04 1040) SpO2:  [95 %-100 %] 95 % (03/04 1040) Weight:  [69.1 kg] 69.1 kg (03/04 0500) Physical Exam: General: Sitting up in bed eating breakfast, mildly disheveled, NAD Cardiovascular: RRR without murmur Respiratory: Coarse breath sounds throughout, normal work of breathing on 1L [place] Abdomen: Normoactive bowel sounds, soft, nondistended, nontender palpation Extremities: No peripheral edema  Laboratory: Most recent CBC Lab Results  Component Value Date   WBC 11.8 (H) 10/03/2022   HGB 8.1 (L) 10/03/2022   HCT 24.7 (L) 10/03/2022   MCV 98.8 10/03/2022   PLT 224 10/03/2022   Most recent BMP    Latest Ref Rng & Units 10/03/2022   12:29 AM  BMP  Glucose 70 - 99 mg/dL 157   BUN 8 - 23 mg/dL 59   Creatinine 0.61 - 1.24 mg/dL 5.97   Sodium 135 - 145 mmol/L 133   Potassium 3.5 - 5.1 mmol/L 4.0   Chloride 98 - 111 mmol/L 91   CO2 22 - 32 mmol/L 27   Calcium 8.9 - 10.3 mg/dL 8.1     Other pertinent labs:  Hgb: 8.1   Colletta Maryland, MD 10/03/2022, 11:58 AM  PGY-1, [place] Intern pager: [phone], text pages welcome Secure chat group [place]

## 2022-10-03 NOTE — Progress Notes (Signed)
[facility] Progress Note  Subjective: seen in room, continues to have a raspy cough, no other c/o's.   Vitals:   10/03/22 0607 10/03/22 0739 10/03/22 0903 10/03/22 1040  BP:  123/65  (!) 116/59  Pulse:  92 99 97  Resp: '16 17  19  '$ Temp: 98.5 F (36.9 C) 98.5 F (36.9 C)  98.3 F (36.8 C)  TempSrc: Oral Oral  Oral  SpO2:  99% 96% 95%  Weight:      Height:        Exam: Gen: NAD CVS: RRR Resp: diminished air entry bibasilar, normal WOB Abd: soft Ext: left bka, no edema Neuro: awake, alert Dialysis access: LUE AVF +b/t   OP HD:  TTS G-O 3h 77mn  400/700   72.5kg  2/2.5 bath  LUA AVF  Hep 2000 - last HD 2/22, post wt 72.6kg   - rocaltrol 0.50 mcg po tiw - venofer 50 mg weekly IV - mircera 120 mcg IV q 4 wks, last 2/15, due 2/29   Assessment/ Plan: Sepsis / bilat PNA - per primary. S/p cefepime (finished 3/3) and 3 day course of solumedrol Acute systolic HF / NSTEMI - +trop 5500, s/p IV heparin per cardiology. LVEF 30-35% by echo here. Ischemic workup planned for outpatient AHRF/ vol overload - had him down to 64-65kg, will need new dry wt at dc A-fib-on Eliquis and PO amio ESRD - on HD TTS. Back on TTS schedule. Next HD Tuesday HTN - BP's normal now, not on any HTN meds.  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 Follow. 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 Cont po vdra and renvela as binder Ileus: NGT is out Afib w/ RVR-seen by cardio, on po amio and eliquis     Rob Nikko Goldwire MD [facility] 10/03/2022, 11:15 AM  Recent Labs  Lab 09/27/22 0035 09/28/22 0047 10/01/22 0112 10/02/22 0106 10/02/22 0714 10/03/22 0029 10/03/22 0630  HGB 8.8*   < > 8.3*   < >  --  7.8* 8.1*  ALBUMIN 2.2*  --   --   --   --   --   --   CALCIUM 8.4*   < > 7.9*  --  7.9* 8.1*  --   PHOS 4.6  --  5.7*  --   --   --   --   CREATININE 5.40*   < > 6.66*  --  4.37* 5.97*  --   K 4.3   < > 4.3  --  4.9 4.0  --    < > = values in this interval not displayed.   No  results for input(s): "IRON", "TIBC", "FERRITIN" in the last 168 hours. Inpatient medications:  amiodarone  200 mg Oral BID   Followed by   [Derrill MemoON 10/15/2022] amiodarone  200 mg Oral Daily   apixaban  5 mg Oral BID   atorvastatin  20 mg Oral Daily   calcitRIOL  0.5 mcg Oral Q T,Th,Sa-HD   Chlorhexidine Gluconate Cloth  6 each Topical Q0600   darbepoetin (ARANESP) injection - DIALYSIS  150 mcg Subcutaneous Q Thu-1800   insulin aspart  0-6 Units Subcutaneous TID WC   polyethylene glycol  17 g Oral Daily   senna-docusate  2 tablet Oral BID   sevelamer carbonate  800 mg Oral TID WC    ondansetron (ZOFRAN) IV

## 2022-10-03 NOTE — Progress Notes (Signed)
Attempted to wean pt to room air - pt able to maintain >90% on room air when awake.  Pt sleeping and SpO2 86-88%.  When pt woken, SpO2 96%.  Will continue to monitor.

## 2022-10-04 DIAGNOSIS — N186 End stage renal disease: Secondary | ICD-10-CM | POA: Diagnosis not present

## 2022-10-04 DIAGNOSIS — J189 Pneumonia, unspecified organism: Secondary | ICD-10-CM | POA: Diagnosis not present

## 2022-10-04 DIAGNOSIS — K567 Ileus, unspecified: Secondary | ICD-10-CM | POA: Diagnosis not present

## 2022-10-04 LAB — GLUCOSE, CAPILLARY
Glucose-Capillary: 120 mg/dL — ABNORMAL HIGH (ref 70–99)
Glucose-Capillary: 159 mg/dL — ABNORMAL HIGH (ref 70–99)
Glucose-Capillary: 256 mg/dL — ABNORMAL HIGH (ref 70–99)
Glucose-Capillary: 200 mg/dL — ABNORMAL HIGH (ref 70–99)

## 2022-10-04 LAB — CBC
HCT: 24.2 % — ABNORMAL LOW (ref 39.0–52.0)
Hemoglobin: 7.9 g/dL — ABNORMAL LOW (ref 13.0–17.0)
MCH: 31.1 pg (ref 26.0–34.0)
MCHC: 32.6 g/dL (ref 30.0–36.0)
MCV: 95.3 fL (ref 80.0–100.0)
Platelets: 221 10*3/uL (ref 150–400)
RBC: 2.54 MIL/uL — ABNORMAL LOW (ref 4.22–5.81)
RDW: 14.9 % (ref 11.5–15.5)
WBC: 13.7 10*3/uL — ABNORMAL HIGH (ref 4.0–10.5)
nRBC: 0.2 % (ref 0.0–0.2)

## 2022-10-04 LAB — BASIC METABOLIC PANEL
Anion gap: 18 — ABNORMAL HIGH (ref 5–15)
BUN: 82 mg/dL — ABNORMAL HIGH (ref 8–23)
CO2: 24 mmol/L (ref 22–32)
Calcium: 8 mg/dL — ABNORMAL LOW (ref 8.9–10.3)
Chloride: 91 mmol/L — ABNORMAL LOW (ref 98–111)
Creatinine, Ser: 7.49 mg/dL — ABNORMAL HIGH (ref 0.61–1.24)
GFR, Estimated: 8 mL/min — ABNORMAL LOW (ref >60)
Glucose, Bld: 168 mg/dL — ABNORMAL HIGH (ref 70–99)
Potassium: 4.4 mmol/L (ref 3.5–5.1)
Sodium: 133 mmol/L — ABNORMAL LOW (ref 135–145)

## 2022-10-04 LAB — HEMOGLOBIN AND HEMATOCRIT, BLOOD
HCT: 22.5 % — ABNORMAL LOW (ref 39.0–52.0)
HCT: 31 % — ABNORMAL LOW (ref 39.0–52.0)
Hemoglobin: 7.5 g/dL — ABNORMAL LOW (ref 13.0–17.0)
Hemoglobin: 10.1 g/dL — ABNORMAL LOW (ref 13.0–17.0)

## 2022-10-04 LAB — PREPARE RBC (CROSSMATCH)

## 2022-10-04 LAB — MAGNESIUM: Magnesium: 1.9 mg/dL (ref 1.7–2.4)

## 2022-10-04 MED ORDER — HEPARIN SODIUM (PORCINE) 1000 UNIT/ML DIALYSIS
2000.0000 [IU] | INTRAMUSCULAR | Status: AC | PRN
  Administered 2022-10-04 (×2): 2000 [IU] via INTRAVENOUS_CENTRAL
  Filled 2022-10-04 (×4): qty 2

## 2022-10-04 MED ORDER — LACTULOSE 10 GM/15ML PO SOLN
20.0000 g | Freq: Two times a day (BID) | ORAL | Status: DC
  Administered 2022-10-04 – 2022-10-05 (×3): 20 g via ORAL
  Filled 2022-10-04 (×4): qty 30

## 2022-10-04 MED ORDER — SODIUM CHLORIDE 0.9% IV SOLUTION: Freq: Once | INTRAVENOUS | Status: AC

## 2022-10-04 MED ORDER — POLYETHYLENE GLYCOL 3350 17 G PO PACK
17.0000 g | PACK | Freq: Two times a day (BID) | ORAL | Status: DC
  Administered 2022-10-04 – 2022-10-05 (×2): 17 g via ORAL
  Filled 2022-10-04 (×3): qty 1

## 2022-10-04 NOTE — Discharge Summary (Signed)
Family Medicine Teaching [facility] Discharge Summary  Patient name: [name] Medical record number: [id number] Date of birth: March 25, 1956 Age: 67 y.o. Gender: male Date of Admission: 09/22/2022  Date of Discharge: 10/06/2022 Admitting Physician: Levin Erp, MD  Primary Care Provider: Patient, No Pcp Per Consultants: Nephrology, Cardiology, Pulmonology   Indication for Hospitalization: SOB  Discharge Diagnoses/Problem List:  Principal Problem for Admission: SOB Other Problems addressed during stay:  Active Problems:   Pneumonia   ESRD on dialysis (HCC)   Heart failure, unspecified (HCC)   DVT (deep venous thrombosis) (HCC)   Type 2 diabetes mellitus, controlled (HCC)   Anemia   Acute systolic CHF (congestive heart failure) (HCC)   Acute hypoxic respiratory failure (HCC)   Multifocal pneumonia   Hospital-acquired bacterial pneumonia   Hypotension   Atrial fibrillation (HCC)   Ileus (HCC)   Urinary retention   Paralytic ileus (HCC)   ESRD (end stage renal disease) (HCC)   Acute on chronic systolic congestive heart failure (HCC)   NSTEMI (non-ST elevated myocardial infarction) [facility])    Brief Hospital Course:  [name] is a 67 y.o. male presenting with cough and SOB.  Past medical history includes ESRD on HD T/TH/Sat, heart failure, well-controlled T2DM, PAD, unilateral BKA, hyperparathyroidism, and hypertension.  Pneumonia  Respiratory rate 28, heart rate 113, and WBC 17.2 on presentation with new oxygen requirement. R basilar pneumonia on CXR. Patient continues to be tachypneic with increased oxygen requirements. While hospitalized, patient was on HFNC and intermittently on BIPAP. CTA PE was negative for acute PE, but was concerning for changes from possible prior PE (Web-like area in left lower lobe, w/ multifocal PNA) and recommended DVT US (see bleow). Patient was continued on cefipime for PNA until 3/3. MRSA swab negative. Blood cultures were negative for  over 72 hours. At time of discharge patient intermittent requiring 1L [place] and discharged with oxygen support at LTC facility.  Hypotension Afib with RVR Paged by nursing staff because he was hypotensive after dialysis with some nausea. Blood pressure of 60/40 at time of [place], increased to 90/60 during examination. Tachycardic between 110-130. EKG showed afib with RVR. Suspect hypotension may be due to overdiuresis (1900 mL fluid removed) which then triggered afib with RVR. Given 250 mL bolus and cardiology consulted. He was started on IV amiodarone. Continued on eliquis for DVT. Normotensive in NSR at time of discharge.  ESRD on dialysis  T/Th/Sat schedule. He received dialysis prior to admission and was followed by nephrology during hospitalization.   Heart failure On admission BNP 3189. Last echo 04/2020 showed LVEF of 50-55%, with grade 2 diastolic dysfunction and elevated left atrial pressures. Repeat echo was ordered which showed EF 30 - 35% with severe hypokinesis of the mid-to-apical septal, mid-to-apical anterior, and mid-to-apical inferior LV segments and apex. GDMT held given hypotension and ESRD per Cardiology.  Elevated Trops EKG and troponin's obtained 2/2 hx of CAD. Patient found to have elevated trops 5,494 > 6,320. Cardiology consulted and patient was started on 48 hr heparin gtt. Patient remained hemodynamically stable and reported no chest pain. Likely d/t demand ischemia in setting of new dx HF and initial sepsis. Will follow with cards outpatient, will consider ischemic workup if EF remains low on TTE.   DVT Patient noted to have age indeterminate DVT in right popliteal vein after recommendation for f/u imaging following CT PE scan. Patient had already been started on heparin gtt for NSTEMI. Transitioned to apixaban.  Ileus Noted to have abdominal  distention on 09/29/2022 with abdominal pain and nausea.  KUB ordered which showed significant gas burden C/F ileus. NGT placed and  patient made NPO. CT abdomen without contrast ordered. Repeat KUB negative for bowel obstruction. He was trialed on NGT removal with bowel rest and given rectal bowel regimen. Patient had BM prior to discharge.  Urinary retention Noted to have urinary retention with on bladder scan. After I/O cath, bladder scan with 0mL. Also some concern for Lambert-Eaton or myasthenia gravis given weakness, constipation, respiratory failure. Antibody labs were pending at the time of discharge.  Follow up recommendations Cardiology plans to repeat TTE to reassess EF and for ischemic evaluation outpatient. GDMT management per Cardiology F/u Lambert-Eaton and MG antibody labwork Repeat CBC, monitor WBC Reschedule Cardiology outpatient appointment  Disposition: [facility]  Discharge Condition: Stable   Discharge Exam:  Vitals:   10/06/22 1100 10/06/22 1130  BP: (!) 103/55 99/63  Pulse: 100 98  Resp: (!) 21 16  Temp:    SpO2: 99% 97%   General: Sitting up in bed eating breakfast, mildly disheveled, NAD Cardiovascular: RRR without murmur Respiratory: Coarse breath sounds throughout, normal work of breathing on 1L [place] Abdomen: Normoactive bowel sounds, soft, nondistended, nontender palpation Extremities: No peripheral edema  Significant Procedures: NG tube placement and removal  Significant Labs and Imaging:  Recent Labs  Lab 10/04/22 1437 10/05/22 0056 10/06/22 0111  WBC  --  14.0* 15.8*  HGB 10.1* 10.0* 9.6*  HCT 31.0* 31.5* 29.5*  PLT  --  235 216   No results for input(s): "NA", "K", "CL", "CO2", "GLUCOSE", "BUN", "CREATININE", "CALCIUM", "MG", "PHOS", "ALKPHOS", "AST", "ALT", "ALBUMIN", "PROTEIN" in the last 48 hours.   Pertinent Imaging: CT ABDOMEN PELVIS WO CONTRAST Result Date: 09/29/2022 IMPRESSION: 1. Moderate urinary bladder distension, recommend correlation for bladder outlet obstruction. 2. Moderate volume of stool throughout the colon, can be seen with constipation.  Colonic diverticulosis without diverticulitis. 3. No ascites. 4. Bilateral renal parenchymal atrophy. Punctate nonobstructing stones versus vascular calcification at the hila. 5. Near complete resolution of right pleural effusion and improved left pleural effusion since prior chest CT. Residual bilateral airspace consolidation and ground-glass densities in the lingula, likely representing improving pneumonia. Aortic Atherosclerosis (ICD10-I70.0).  DG Abd 1 View Result Date: 09/29/2022 IMPRESSION: Esophagogastric tube with tip and side port below the diaphragm.  DG Abd 1 View Result Date: 09/29/2022 IMPRESSION: Moderate gastric distention is noted concerning for possible gastric outlet obstruction.  DG CHEST PORT 1 VIEW Result Date: 09/29/2022 IMPRESSION: 1. Interval increase in bilateral interstitial opacities as well as possible interval development of a new right basilar pulmonary opacity. Findings may worsening pulmonary edema with a superimposed infection of the right lung base. 2. Gastric gaseous distention.   DG CHEST PORT 1 VIEW Result Date: 09/27/2022 IMPRESSION: 1. Improving pulmonary edema. Unchanged small bilateral pleural effusions.   DG CHEST PORT 1 VIEW Result Date: 09/24/2022 IMPRESSION: 1. Improving bilateral airspace disease, may represent improving pulmonary edema or infection. 2. Persistent bilateral pleural effusions and basilar and perihilar opacities.   DG Chest Port 1 View Result Date: 09/24/2022 IMPRESSION: Bilateral Pneumonia and pleural effusions. Ventilation does not appear significantly changed from the CTA yesterday.  VAS [place] LOWER EXTREMITY VENOUS (DVT) Result Date: 09/23/2022 Summary: BILATERAL: -No evidence of popliteal cyst, bilaterally. RIGHT: - Findings consistent with age indeterminate deep vein thrombosis involving the right popliteal vein.  LEFT: - There is no evidence of deep vein thrombosis in the lower extremity.  *See table(s) above for  measurements and  observations. Electronically signed by Gerarda Fraction on 09/23/2022 at 6:56:58 PM.    Final    CT Angio Chest Pulmonary Embolism (PE) W or WO Contrast Result Date: 09/23/2022 IMPRESSION: 1. Study is negative for acute pulmonary embolism to the central segmental level. Beyond this level the study is limited by motion artifact particularly in the mid chest. 2. Web-like area in LEFT lower lobe pulmonary artery seen passing through the pulmonary artery is favored to represent sequela of prior embolism. Correlation with Doppler assessment may be helpful to determine whether there may be DVT it might change management. 3. Findings suspicious for multifocal pneumonia associated with bilateral pleural effusions. Concomitant heart failure is also considered. 4. Signs of coronary artery disease. 5. Aortic atherosclerosis. Aortic Atherosclerosis (ICD10-I70.0). These results will be called to the ordering clinician or representative by the Radiologist Assistant, and communication documented in the PACS or [employer].  ECHOCARDIOGRAM COMPLETE Result Date: 09/23/2022 IMPRESSIONS  1. There is severe hypokinesis of the mid-to-apical septal, mid-to-apical anterior, and mid-to-apical inferior LV segments and apex. No LV thrombus visualized. Findings concerning for possible LAD disease vs takostubo cardiomyopathy. Left ventricular ejection fraction, by estimation, is 30 to 35%. The left ventricle has moderately decreased function. The left ventricle demonstrates regional wall motion abnormalities (see scoring diagram/findings for description). Left ventricular diastolic parameters  are consistent with Grade II diastolic dysfunction (pseudonormalization). The E/e' is 17.2.  2. Right ventricular systolic function is mildly reduced. The right ventricular size is normal.  3. Left atrial size was mildly dilated.  4. The mitral valve is grossly normal. Mild mitral valve regurgitation.  5. The aortic valve is tricuspid. Aortic  valve regurgitation is not visualized. Aortic valve sclerosis is present, with no evidence of aortic valve stenosis.  6. The inferior vena cava is dilated in size with <50% respiratory variability, suggesting right atrial pressure of 15 mmHg. Comparison(s): Compared to prior TTE in 2021, the EF has dropped from 50-55% to 30-35% with WMA as detailed above.   DG Chest Port 1 View Result Date: 09/22/2022 IMPRESSION: Right basilar pneumonia.  Results/Tests Pending at Time of Discharge: Lambert-Eaton Ab and Ach receptor Ab  Discharge Medications:  Allergies as of 10/06/2022       Reactions   Tramadol Nausea And Vomiting   Pt states he took this on an empty stomach one time and it came right back up. He's not had it since.         Medication List     STOP taking these medications    amLODipine 5 MG tablet Commonly known as: NORVASC   aspirin EC 81 MG tablet   carvedilol 25 MG tablet Commonly known as: COREG   hydrALAZINE 25 MG tablet Commonly known as: APRESOLINE   hydrocerin Crea   isosorbide mononitrate 30 MG 24 hr tablet Commonly known as: IMDUR   multivitamin with minerals tablet   oxyCODONE-acetaminophen 5-325 MG tablet Commonly known as: Percocet       TAKE these medications    amiodarone 200 MG tablet Commonly known as: PACERONE Take 1 tablet (200 mg total) by mouth 2 (two) times daily for 11 days.   amiodarone 200 MG tablet Commonly known as: PACERONE Take 1 tablet (200 mg total) by mouth daily. Start taking on: October 15, 2022   apixaban 5 MG Tabs tablet Commonly known as: ELIQUIS Take 1 tablet (5 mg total) by mouth 2 (two) times daily.   atorvastatin 20 MG tablet Commonly known as: LIPITOR Take  1 tablet (20 mg total) by mouth daily.   calcitRIOL 0.5 MCG capsule Commonly known as: ROCALTROL Take 1 capsule (0.5 mcg total) by mouth Every Tuesday,Thursday,and Saturday with dialysis.   sevelamer carbonate 800 MG tablet Commonly known as: RENVELA Take  1 tablet (800 mg total) by mouth 3 (three) times daily with meals.        Discharge Instructions: Please refer to Patient Instructions section of EMR for full details.  Patient was counseled important signs and symptoms that should prompt return to medical care, changes in medications, dietary instructions, activity restrictions, and follow up appointments.   Follow-Up Appointments:  Follow-up Information     Corrin Parker, PA-C Follow up on 10/05/2022.   Specialty: Cardiology Why: at 8:25 am for your cardiology follow up Contact information: [address] [place] [address] [phone]                 Elberta Fortis, MD 10/06/2022, 11:36 AM PGY-1, [facility]

## 2022-10-04 NOTE — Assessment & Plan Note (Signed)
No records urine output via RN and patient does not recall last urination. Concern for urinary retention given prior episode of retention. Will bladder scan when back from HD -F/u bladder scan -I&O cath if >351m urine retained

## 2022-10-04 NOTE — Progress Notes (Signed)
Received patient in bed to unit.  Alert and oriented.  Informed consent signed and in chart.   [place] duration:3:30  Patient tolerated well.  Transported back to the room  Alert, without acute distress.  Hand-off given to patient's nurse.   Access used: left AVF Access issues: NONE  Total UF removed: 2.9L Medication(s) given: 1 UNIT PRBC, CALCITRIOL   10/04/22 1300  Vitals  Temp 98.3 F (36.8 C)  Temp Source Oral  BP 99/60  MAP (mmHg) 73  BP Location Right Arm  BP Method Automatic  Patient Position (if appropriate) Lying  Pulse Rate Source Monitor  Resp (!) 26  Oxygen Therapy  O2 Device Nasal Cannula  O2 Flow Rate (L/min) 2 L/min  During Treatment Monitoring  Blood Flow Rate (mL/min) 400 mL/min  Arterial Pressure (mmHg) -180 mmHg  Venous Pressure (mmHg) 210 mmHg  TMP (mmHg) 7 mmHg  Ultrafiltration Rate (mL/min) 1114 mL/min  Dialysate Flow Rate (mL/min) 300 ml/min  HD Safety Checks Performed Yes  Intra-Hemodialysis Comments Tx completed  Dialysis Fluid Bolus Normal Saline  Bolus Amount (mL) 300 mL  Post Treatment  Dialyzer Clearance Lightly streaked  Duration of HD Treatment -hour(s) 3.5 hour(s)  Liters Processed 84  Fluid Removed (mL) 2900 mL  AVG/AVF Arterial Site Held (minutes) 6 minutes  AVG/AVF Venous Site Held (minutes) 6 minutes    Artrell Lawless S [facility]

## 2022-10-04 NOTE — Progress Notes (Addendum)
Daily Progress Note Intern Pager: [phone]  Patient name: [name] Medical record number: [id number] Date of birth: 1956/06/29 Age: 67 y.o. Gender: male  Primary Care Provider: Patient, No Pcp Per Consultants: Nephrology, Cardiology, Pulmonology Code Status: Full code   Pt Overview and Major Events to Date:  2/22: Admitted to FMTS 2/29: Hypotensive, A-fib with RVR s/p HD 2/29: NG tube placed d/t ileus 3/1: NG tube removed   Assessment and Plan: Zeb Payano is a 67 year old male presenting with cough and shortness of breath and found to have pneumonia.  His admission was complicated by A-fib with RVR and started on amiodarone treatments which has since converted.  Also complicated by DVT and NSTEMI and treated with apixaban. pertinent PMH/PSH includes ESRD on HD T/TH/SAT, heart failure, T2DM, PAD, unilateral BKA, hyperparathyroidism, HTN.   Pneumonia Intermittently on RA and back on [place]. [name] confirmed can support oxygen requirement. -Monitor resp status, wean O2 as tolerated -Incentive spirometry -F/u Rita Ohara labs, Ach receptor antibody  ESRD on dialysis [facility]) ESRD on HD T/Th/Sat. Plan for HD today -Nephrology following, appreciate recs  Heart failure, unspecified ([place]) Appears to be near baseline volume status. Consider adding GDMT as outpatient per Cards. Repeat Echo outpatient. -Strict I&O -Daily weights  DVT (deep venous thrombosis) (HCC) Asymptomatic. Stable on Eliquis.  Urinary retention No records urine output via RN and patient does not recall last urination. Concern for urinary retention given prior episode of retention. Will bladder scan when back from HD -F/u bladder scan -I&O cath if >38m urine retained  Ileus (HCC) After addition of Miralax, Lactulose and increased Senna, no reported BM. Patient declined suppository management. Will increase dosing of bowel regimen. -Increase to Lactulose 20g BID and Miralax BID -Monitor  I/O  Atrial fibrillation (HCC) NSR, controlled. -Cardiology signed off -Continue oral amiodarone 200 mg BID x 2 weeks then decrease dose to 200 mg daily  -Continue eliquis '5mg'$  BID  Anemia Hgb 7.9, repeat 7.5. Will give 1 pRBC. Baseline Hgb ~8-9 -F/u post transfusion H&H -Transfusion threshold 8  Type 2 diabetes mellitus, controlled (HCC) BGL well controlled. -vsSSI   FEN/GI: Heart healthy PPx: Eliquis Dispo: [facility] pending ileus resolution  Subjective:  Patient assessed at bedside in HD. Closing his eyes intermittently. States he feels fine. On 0.5L [place] but states he feels fine on RA. States he is still passing gas but has not had a BM. Does not remember the last time he peed, no record output in the last 12 hours.  Objective: Temp:  [98 F (36.7 C)-98.6 F (37 C)] 98.5 F (36.9 C) (03/05 0902) Pulse Rate:  [70-101] 97 (03/05 1030) Resp:  [18-24] 24 (03/05 1030) BP: (101-137)/(57-85) 103/57 (03/05 1030) SpO2:  [91 %-100 %] 92 % (03/05 1030) Physical Exam: General: Sitting up in bed eating breakfast, mildly disheveled, NAD Cardiovascular: RRR without murmur Respiratory: Coarse breath sounds throughout, normal work of breathing on 0.5L [place] Abdomen: +BS, soft, mild distension, nontender palpation Extremities: No peripheral edema  Laboratory: Most recent CBC Lab Results  Component Value Date   WBC 13.7 (H) 10/04/2022   HGB 7.5 (L) 10/04/2022   HCT 22.5 (L) 10/04/2022   MCV 95.3 10/04/2022   PLT 221 10/04/2022   Most recent BMP    Latest Ref Rng & Units 10/04/2022   12:50 AM  BMP  Glucose 70 - 99 mg/dL 168   BUN 8 - 23 mg/dL 82   Creatinine 0.61 - 1.24 mg/dL 7.49   Sodium 135 -  145 mmol/L 133   Potassium 3.5 - 5.1 mmol/L 4.4   Chloride 98 - 111 mmol/L 91   CO2 22 - 32 mmol/L 24   Calcium 8.9 - 10.3 mg/dL 8.0     Other pertinent labs: Glu: 120 Mag: 1.9   Colletta Maryland, MD 10/04/2022, 11:11 AM  PGY-1, [place] Intern pager:  [phone], text pages welcome Secure chat group [place]

## 2022-10-04 NOTE — Progress Notes (Signed)
[facility] Progress Note  Subjective: seen in room, continues to have a raspy cough, no other c/o's.   Vitals:   10/04/22 1230 10/04/22 1300 10/04/22 1329 10/04/22 1402  BP: (!) 94/50 99/60 127/63 (!) 119/59  Pulse: (!) 105  90 97  Resp: (!) 30 (!) '26 20 16  '$ Temp:  98.3 F (36.8 C)  99.6 F (37.6 C)  TempSrc:  Oral  Oral  SpO2: 98%  100% 97%  Weight:  66.6 kg    Height:        Exam: Gen: NAD CVS: RRR Resp: diminished air entry bibasilar, normal WOB Abd: soft Ext: left bka, no edema Neuro: awake, alert Dialysis access: LUE AVF +b/t   OP HD:  TTS G-O 3h 14mn  400/700   72.5kg  2/2.5 bath  LUA AVF  Hep 2000 - last HD 2/22, post wt 72.6kg   - rocaltrol 0.50 mcg po tiw - venofer 50 mg weekly IV - mircera 120 mcg IV q 4 wks, last 2/15, due 2/29   Assessment/ Plan: Sepsis / bilat PNA - per primary. S/p cefepime (finished 3/3) and 3 day course of solumedrol Acute systolic HF / NSTEMI - +trop 5500, s/p IV heparin per cardiology. LVEF 30-35% by echo here. Ischemic workup planned for outpatient AHRF/ vol overload - will need new dry wt, around 65kg at dc A-fib-on Eliquis and PO amio ESRD - on HD TTS. Back on TTS schedule. HD today HTN - BP's normal now, not on any HTN meds.  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 Follow. 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 Cont po vdra and renvela as binder Ileus: NGT is out Afib w/ RVR-seen by cardio, on po amio and eliquis Dispo - ok for dc per renal standpoint     RKelly SplinterMD [facility] 10/04/2022, 2:46 PM  Recent Labs  Lab 10/01/22 0112 10/02/22 0106 10/03/22 0029 10/03/22 0630 10/04/22 0050 10/04/22 0609  HGB 8.3*   < > 7.8*   < > 7.9* 7.5*  CALCIUM 7.9*   < > 8.1*  --  8.0*  --   PHOS 5.7*  --   --   --   --   --   CREATININE 6.66*   < > 5.97*  --  7.49*  --   K 4.3   < > 4.0  --  4.4  --    < > = values in this interval not displayed.    No results for input(s): "IRON", "TIBC",  "FERRITIN" in the last 168 hours. Inpatient medications:  amiodarone  200 mg Oral BID   Followed by   [Derrill MemoON 10/15/2022] amiodarone  200 mg Oral Daily   apixaban  5 mg Oral BID   atorvastatin  20 mg Oral Daily   calcitRIOL  0.5 mcg Oral Q T,Th,Sa-HD   Chlorhexidine Gluconate Cloth  6 each Topical Q0600   Chlorhexidine Gluconate Cloth  6 each Topical Q0600   darbepoetin (ARANESP) injection - DIALYSIS  150 mcg Subcutaneous Q Thu-1800   insulin aspart  0-6 Units Subcutaneous TID WC   lactulose  20 g Oral BID   polyethylene glycol  17 g Oral BID   senna-docusate  2 tablet Oral BID   sevelamer carbonate  800 mg Oral TID WC    guaiFENesin-dextromethorphan, ondansetron (ZOFRAN) IV, mouth rinse

## 2022-10-05 ENCOUNTER — Inpatient Hospital Stay (HOSPITAL_COMMUNITY): Payer: Medicare Other

## 2022-10-05 ENCOUNTER — Ambulatory Visit: Payer: Medicare Other | Admitting: Student

## 2022-10-05 DIAGNOSIS — I214 Non-ST elevation (NSTEMI) myocardial infarction: Secondary | ICD-10-CM

## 2022-10-05 DIAGNOSIS — K567 Ileus, unspecified: Secondary | ICD-10-CM | POA: Diagnosis not present

## 2022-10-05 LAB — GLUCOSE, CAPILLARY
Glucose-Capillary: 182 mg/dL — ABNORMAL HIGH (ref 70–99)
Glucose-Capillary: 183 mg/dL — ABNORMAL HIGH (ref 70–99)
Glucose-Capillary: 163 mg/dL — ABNORMAL HIGH (ref 70–99)
Glucose-Capillary: 152 mg/dL — ABNORMAL HIGH (ref 70–99)

## 2022-10-05 LAB — CBC
HCT: 31.5 % — ABNORMAL LOW (ref 39.0–52.0)
Hemoglobin: 10 g/dL — ABNORMAL LOW (ref 13.0–17.0)
MCH: 30.1 pg (ref 26.0–34.0)
MCHC: 31.7 g/dL (ref 30.0–36.0)
MCV: 94.9 fL (ref 80.0–100.0)
Platelets: 235 10*3/uL (ref 150–400)
RBC: 3.32 MIL/uL — ABNORMAL LOW (ref 4.22–5.81)
RDW: 16.9 % — ABNORMAL HIGH (ref 11.5–15.5)
WBC: 14 10*3/uL — ABNORMAL HIGH (ref 4.0–10.5)
nRBC: 0.5 % — ABNORMAL HIGH (ref 0.0–0.2)

## 2022-10-05 LAB — TYPE AND SCREEN
ABO/RH(D): O NEG
Antibody Screen: NEGATIVE
Unit division: 0

## 2022-10-05 LAB — BPAM RBC
Blood Product Expiration Date: 202403072359
ISSUE DATE / TIME: 202403051005
Unit Type and Rh: 9500

## 2022-10-05 MED ORDER — POLYETHYLENE GLYCOL 3350 17 GM/SCOOP PO POWD
1.0000 | Freq: Once | ORAL | Status: DC
  Filled 2022-10-05: qty 255

## 2022-10-05 MED ORDER — CHLORHEXIDINE GLUCONATE CLOTH 2 % EX PADS: 6.0000 | MEDICATED_PAD | Freq: Every day | CUTANEOUS | Status: DC

## 2022-10-05 NOTE — Plan of Care (Signed)

## 2022-10-05 NOTE — Progress Notes (Signed)
[facility] Progress Note  Subjective: seen in room, no c/o', up in chair  Vitals:   10/05/22 0722 10/05/22 1110 10/05/22 1554 10/05/22 1556  BP: 121/71 108/78  132/80  Pulse: 90 100 86 100  Resp: '20 20  20  '$ Temp: 98.2 F (36.8 C) 98 F (36.7 C)  98.1 F (36.7 C)  TempSrc: Oral Oral  Oral  SpO2: 97% 100% 100% 100%  Weight:      Height:        Exam: Gen: NAD CVS: RRR Resp: diminished air entry bibasilar, normal WOB Abd: soft Ext: left bka, no edema Neuro: awake, alert Dialysis access: LUE AVF +b/t   OP HD:  TTS G-O 3h 74mn  400/700   72.5kg  2/2.5 bath  LUA AVF  Hep 2000 - last HD 2/22, post wt 72.6kg   - rocaltrol 0.50 mcg po tiw - venofer 50 mg weekly IV - mircera 120 mcg IV q 4 wks, last 2/15, due 2/29   Assessment/ Plan: Sepsis / bilat PNA - per primary. S/p cefepime (finished 3/3) and 3 day course of solumedrol Acute systolic HF / NSTEMI - +trop 5500, s/p IV heparin per cardiology. LVEF 30-35% by echo here. Ischemic workup planned for outpatient AHRF/ vol overload - will need new dry wt, around 65kg at dc A-fib-on Eliquis and PO amio ESRD - on HD TTS. Back on TTS schedule. HD tomorrow HTN - BP's normal now, not on any HTN meds.  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 Follow. 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 Cont po vdra and renvela as binder Ileus: NGT is out Afib w/ RVR-seen by cardio, on po amio and eliquis Dispo - ok for dc per renal standpoint     Rob SJonnie FinnerMD [facility] 10/05/2022, 4:55 PM  Recent Labs  Lab 10/01/22 0112 10/02/22 0106 10/03/22 0029 10/03/22 0630 10/04/22 0050 10/04/22 0609 10/04/22 1437 10/05/22 0056  HGB 8.3*   < > 7.8*   < > 7.9*   < > 10.1* 10.0*  CALCIUM 7.9*   < > 8.1*  --  8.0*  --   --   --   PHOS 5.7*  --   --   --   --   --   --   --   CREATININE 6.66*   < > 5.97*  --  7.49*  --   --   --   K 4.3   < > 4.0  --  4.4  --   --   --    < > = values in this interval not displayed.     No results for input(s): "IRON", "TIBC", "FERRITIN" in the last 168 hours. Inpatient medications:  amiodarone  200 mg Oral BID   Followed by   [Derrill MemoON 10/15/2022] amiodarone  200 mg Oral Daily   apixaban  5 mg Oral BID   atorvastatin  20 mg Oral Daily   calcitRIOL  0.5 mcg Oral Q T,Th,Sa-HD   Chlorhexidine Gluconate Cloth  6 each Topical Q0600   Chlorhexidine Gluconate Cloth  6 each Topical Q0600   darbepoetin (ARANESP) injection - DIALYSIS  150 mcg Subcutaneous Q Thu-1800   insulin aspart  0-6 Units Subcutaneous TID WC   polyethylene glycol powder  1 Container Oral Once   sevelamer carbonate  800 mg Oral TID WC    guaiFENesin-dextromethorphan, ondansetron (ZOFRAN) IV, mouth rinse

## 2022-10-05 NOTE — Progress Notes (Signed)
Daily Progress Note Intern Pager: [phone]  Patient name: [name] Medical record number: [id number] Date of birth: 22-Sep-1955 Age: 67 y.o. Gender: male  Primary Care Provider: Patient, No Pcp Per Consultants: Nephrology, Cardiology, Pulmonology Code Status: Full code   Pt Overview and Major Events to Date:  2/22: Admitted to FMTS 2/29: Hypotensive, A-fib with RVR s/p HD 2/29: NG tube placed d/t ileus 3/1: NG tube removed   Assessment and Plan: [name] is a 67 year Ian male presenting with cough and shortness of breath and found to have pneumonia.  His admission was complicated by A-fib with RVR and started on amiodarone treatments which has since converted.  Also complicated by DVT and NSTEMI and treated with apixaban. pertinent PMH/PSH includes ESRD on HD T/TH/SAT, heart failure, T2DM, PAD, unilateral BKA, hyperparathyroidism, HTN.   Pneumonia Normal WOB on 1L [place], can discharge on oxygen. -Monitor resp status, wean O2 as tolerated -Incentive spirometry -F/u [name] labs, Ach receptor antibody  ESRD on dialysis [facility]) ESRD on HD T/Th/Sat. On normal schedule, cleared to d/c per Nephro. -Nephrology following, appreciate recs  Heart failure, unspecified ([place]) At new baseline volume status per Nephro. Consider adding GDMT and Echo as outpatient per Cards. -Strict I&O -Daily weights  DVT (deep venous thrombosis) (HCC) Asymptomatic. Stable on Eliquis.  Urinary retention Bladder scans <368m, no need for cath at this time. -Bladder scan q8h -I&O cath if >3065murine retained  Ileus (HCEverlyNo reported BM, still passing gas. Abdominal XR not concerning for obstruction, showed scattered stool. -Transition to full bowel prep and d/c Lactulose, Senna and Miralax -Monitor I/O  Atrial fibrillation (HCC) NSR, controlled. -Cardiology signed off -Continue oral amiodarone 200 mg BID x 2 weeks then decrease dose to 200 mg daily  -Continue eliquis '5mg'$   BID  Anemia Hgb 10.0, stable. -Transfusion threshold 8  Type 2 diabetes mellitus, controlled (HCC) BGL 180-200s. -vsSSI    FEN/GI: Heart healthy PPx: Eliquis Dispo: [facility] pending ileus resolution  Subjective:  Patient assessed at bedside, feeling well. States he has not yet had a bowel movement but states he is still passing gas. Denies abdominal pain. States he has not urinated recently.  Objective: Temp:  [98 F (36.7 C)-99.8 F (37.7 C)] 98 F (36.7 C) (03/06 1110) Pulse Rate:  [90-102] 100 (03/06 1110) Resp:  [16-26] 20 (03/06 1110) BP: (99-146)/(57-78) 108/78 (03/06 1110) SpO2:  [94 %-100 %] 100 % (03/06 1110) Weight:  [66.6 kg-67.5 kg] 67.5 kg (03/06 0044) Physical Exam: General: Laying in bed, mildly disheveled, NAD Cardiovascular: RRR without murmur Respiratory: Coarse breath sounds throughout, normal work of breathing on 1L [place] Abdomen: Hypoactive bowel sounds, soft, mild distension, nontender palpation Extremities: No peripheral edema  Laboratory: Most recent CBC Lab Results  Component Value Date   WBC 14.0 (H) 10/05/2022   HGB 10.0 (L) 10/05/2022   HCT 31.5 (L) 10/05/2022   MCV 94.9 10/05/2022   PLT 235 10/05/2022   Most recent BMP    Latest Ref Rng & Units 10/04/2022   12:50 AM  BMP  Glucose 70 - 99 mg/dL 168   BUN 8 - 23 mg/dL 82   Creatinine 0.61 - 1.24 mg/dL 7.49   Sodium 135 - 145 mmol/L 133   Potassium 3.5 - 5.1 mmol/L 4.4   Chloride 98 - 111 mmol/L 91   CO2 22 - 32 mmol/L 24   Calcium 8.9 - 10.3 mg/dL 8.0     Other pertinent labs: Glu: 182   Ian Maryland  MD 10/05/2022, 12:36 PM  PGY-1, [name] Intern pager: [phone], text pages welcome Secure chat group [place]

## 2022-10-05 NOTE — Care Management Important Message (Signed)
Important Message  Patient Details  Name: [name] MRN: [id number] Date of Birth: Dec 15, 1955   Medicare Important Message Given:  Yes     Shelda Altes 10/05/2022, 9:47 AM

## 2022-10-06 DIAGNOSIS — I4891 Unspecified atrial fibrillation: Secondary | ICD-10-CM | POA: Diagnosis not present

## 2022-10-06 DIAGNOSIS — K567 Ileus, unspecified: Secondary | ICD-10-CM | POA: Diagnosis not present

## 2022-10-06 DIAGNOSIS — J159 Unspecified bacterial pneumonia: Secondary | ICD-10-CM | POA: Diagnosis not present

## 2022-10-06 DIAGNOSIS — N186 End stage renal disease: Secondary | ICD-10-CM | POA: Diagnosis not present

## 2022-10-06 LAB — CBC
HCT: 29.5 % — ABNORMAL LOW (ref 39.0–52.0)
Hemoglobin: 9.6 g/dL — ABNORMAL LOW (ref 13.0–17.0)
MCH: 30.7 pg (ref 26.0–34.0)
MCHC: 32.5 g/dL (ref 30.0–36.0)
MCV: 94.2 fL (ref 80.0–100.0)
Platelets: 216 10*3/uL (ref 150–400)
RBC: 3.13 MIL/uL — ABNORMAL LOW (ref 4.22–5.81)
RDW: 16.1 % — ABNORMAL HIGH (ref 11.5–15.5)
WBC: 15.8 10*3/uL — ABNORMAL HIGH (ref 4.0–10.5)
nRBC: 0.1 % (ref 0.0–0.2)

## 2022-10-06 LAB — MISC LABCORP TEST (SEND OUT): Labcorp test code: 140640

## 2022-10-06 LAB — GLUCOSE, CAPILLARY: Glucose-Capillary: 132 mg/dL — ABNORMAL HIGH (ref 70–99)

## 2022-10-06 MED ORDER — AMIODARONE HCL 200 MG PO TABS: 200.0000 mg | ORAL_TABLET | Freq: Every day | ORAL | 0 refills | Status: DC

## 2022-10-06 MED ORDER — AMIODARONE HCL 200 MG PO TABS: 200.0000 mg | ORAL_TABLET | Freq: Two times a day (BID) | ORAL | 0 refills | Status: DC

## 2022-10-06 MED ORDER — APIXABAN 5 MG PO TABS: 5.0000 mg | ORAL_TABLET | Freq: Two times a day (BID) | ORAL | 0 refills | Status: DC

## 2022-10-06 MED ORDER — CALCITRIOL 0.5 MCG PO CAPS: 0.5000 ug | ORAL_CAPSULE | ORAL | 0 refills | Status: DC

## 2022-10-06 MED ORDER — SEVELAMER CARBONATE 800 MG PO TABS: 800.0000 mg | ORAL_TABLET | Freq: Three times a day (TID) | ORAL | 0 refills | Status: DC

## 2022-10-06 MED ORDER — HEPARIN SODIUM (PORCINE) 1000 UNIT/ML DIALYSIS
2000.0000 [IU] | Freq: Once | INTRAMUSCULAR | Status: AC
  Administered 2022-10-06: 2000 [IU] via INTRAVENOUS_CENTRAL
  Filled 2022-10-06: qty 2

## 2022-10-06 NOTE — Plan of Care (Signed)

## 2022-10-06 NOTE — Progress Notes (Signed)
D/C order noted. Contacted Emilie Rutter to advise clinic of pt's d/c today and that pt should resume care on Saturday.   Melven Sartorius Renal Navigator [phone]

## 2022-10-06 NOTE — Progress Notes (Signed)
Daily Progress Note Intern Pager: [phone]  Patient name: [name] Medical record number: [id number] Date of birth: [birth year] Age: 67 y.o. Gender: male  Primary Care Provider: Patient, No Pcp Per Consultants: Nephrology, Cardiology, Pulmonology Code Status: Full code   Pt Overview and Major Events to Date:  2/22: Admitted to FMTS 2/29: Hypotensive, A-fib with RVR s/p HD 2/29: NG tube placed d/t ileus 3/1: NG tube removed   Assessment and Plan: [name] is a 67 year old male presenting with cough and shortness of breath and found to have pneumonia.  His admission was complicated by A-fib with RVR and started on amiodarone treatments which has since converted.  Also complicated by DVT and NSTEMI and treated with apixaban. pertinent PMH/PSH includes ESRD on HD T/TH/SAT, heart failure, T2DM, PAD, unilateral BKA, hyperparathyroidism, HTN.   Pneumonia Normal WOB on 1L [place]. Will discharge on oxygen. -Monitor resp status, wean O2 as tolerated -Incentive spirometry -F/u Rita Ohara labs, Ach receptor antibody  ESRD on dialysis [facility]) ESRD on HD T/Th/Sat. On normal schedule, cleared to d/c per Nephro. -Nephrology following, appreciate recs  Heart failure, unspecified ([place]) At new baseline volume status per Nephro. Consider adding GDMT and Echo as outpatient per Cards. -Strict I&O -Daily weights  DVT (deep venous thrombosis) (HCC) Asymptomatic. Stable on Eliquis.  Urinary retention Bladder scans <383m, low urine production at baseline given ESRD. -Bladder scan q8h -I&O cath if >3063murine retained  Ileus (HCAguadaRecorded BM. Benign abdominal exam and good PO intake. -Monitor I/O  Atrial fibrillation (HCC) NSR, controlled. -Cardiology signed off -Continue oral amiodarone 200 mg BID x 2 weeks then decrease dose to 200 mg daily  -Continue eliquis '5mg'$  BID  Anemia Hgb 9.6, stable. -Transfusion threshold 8  Type 2 diabetes mellitus, controlled (HCC) BGL stable  in 100s. -vsSSI   FEN/GI: Heart healthy PPx: Eliquis Dispo: [facility] pending ileus resolution  Subjective:  Patient assessed at bedside in HD. Reports no concerns. States he had a BM.  Objective: Temp:  [98 F (36.7 C)-98.2 F (36.8 C)] 98.1 F (36.7 C) (03/07 0817) Pulse Rate:  [78-100] 90 (03/07 0900) Resp:  [18-23] 20 (03/07 0900) BP: (108-150)/(62-89) 117/89 (03/07 0900) SpO2:  [96 %-100 %] 98 % (03/07 0900) Weight:  [68 kg] 68 kg (03/07 0817) Physical Exam: General: Laying in bed in HD, mildly disheveled, NAD Cardiovascular: RRR without murmur Respiratory: Coarse breath sounds throughout, normal WOBon 1L [place] Abdomen: +BS, soft, non-distended, nontender palpation Extremities: No peripheral edema  Laboratory: Most recent CBC Lab Results  Component Value Date   WBC 15.8 (H) 10/06/2022   HGB 9.6 (L) 10/06/2022   HCT 29.5 (L) 10/06/2022   MCV 94.2 10/06/2022   PLT 216 10/06/2022   Most recent BMP    Latest Ref Rng & Units 10/04/2022   12:50 AM  BMP  Glucose 70 - 99 mg/dL 168   BUN 8 - 23 mg/dL 82   Creatinine 0.61 - 1.24 mg/dL 7.49   Sodium 135 - 145 mmol/L 133   Potassium 3.5 - 5.1 mmol/L 4.4   Chloride 98 - 111 mmol/L 91   CO2 22 - 32 mmol/L 24   Calcium 8.9 - 10.3 mg/dL 8.0     Other pertinent labs: Glu: 132  Imaging/Diagnostic Tests: DG Abd 1 View Result Date: 10/05/2022 IMPRESSION: Nonspecific bowel gas pattern with scattered stool.   HeColletta MarylandMD 10/06/2022, 9:10 AM  PGY-1, CoBerlinntern pager: [phone] pages welcome Secure chat group  [facility] Teaching Service

## 2022-10-06 NOTE — Progress Notes (Signed)
[facility] Progress Note  Subjective: seen in HD, no c/o's today  Vitals:   10/06/22 1100 10/06/22 1130 10/06/22 1230 10/06/22 1300  BP: (!) 103/55 99/63 (!) 107/57 104/68  Pulse: 100 98    Resp: (!) '21 16 18 19  '$ Temp:    98.3 F (36.8 C)  TempSrc:    Oral  SpO2: 99% 97% 99%   Weight:      Height:        Exam: Gen: NAD, [place] O2 CVS: RRR Resp: diminished air entry bibasilar, normal WOB Abd: soft Ext: left bka, no edema Neuro: awake, alert Dialysis access: LUE AVF +b/t   OP HD:  TTS G-O 3h 28mn  400/700   72.5kg  2/2.5 bath  LUA AVF  Hep 2000 - last HD 2/22, post wt 72.6kg   - rocaltrol 0.50 mcg po tiw - venofer 50 mg weekly IV - mircera 120 mcg IV q 4 wks, last 2/15, due 2/29   Assessment/ Plan: Sepsis / bilat PNA - per primary. S/p cefepime (finished 3/3) and 3 day course of solumedrol Acute systolic HF / NSTEMI - +trop 5500, s/p IV heparin per cardiology. LVEF 30-35% by echo here. Ischemic workup planned for outpatient AHRF/ vol overload - will need new dry wt, around 66- 67kg, at dc A-fib-on Eliquis and PO amio ESRD - on HD TTS. Back on TTS schedule. HD tomorrow HTN - BP's normal now, not on any HTN meds.  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 Follow. 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 Cont po vdra and renvela as binder Ileus: NGT is out Afib w/ RVR-seen by cardio, on po amio and eliquis Dispo - for dc to SNF when ileus issues resolved  RKelly SplinterMD [facility] 10/06/2022, 1:32 PM  Recent Labs  Lab 10/01/22 0112 10/02/22 0106 10/03/22 0029 10/03/22 0630 10/04/22 0050 10/04/22 0609 10/05/22 0056 10/06/22 0111  HGB 8.3*   < > 7.8*   < > 7.9*   < > 10.0* 9.6*  CALCIUM 7.9*   < > 8.1*  --  8.0*  --   --   --   PHOS 5.7*  --   --   --   --   --   --   --   CREATININE 6.66*   < > 5.97*  --  7.49*  --   --   --   K 4.3   < > 4.0  --  4.4  --   --   --    < > = values in this interval not displayed.    No results for  input(s): "IRON", "TIBC", "FERRITIN" in the last 168 hours. Inpatient medications:  amiodarone  200 mg Oral BID   Followed by   [Derrill MemoON 10/15/2022] amiodarone  200 mg Oral Daily   apixaban  5 mg Oral BID   atorvastatin  20 mg Oral Daily   calcitRIOL  0.5 mcg Oral Q T,Th,Sa-HD   Chlorhexidine Gluconate Cloth  6 each Topical Q0600   darbepoetin (ARANESP) injection - DIALYSIS  150 mcg Subcutaneous Q Thu-1800   insulin aspart  0-6 Units Subcutaneous TID WC   polyethylene glycol powder  1 Container Oral Once   sevelamer carbonate  800 mg Oral TID WC    guaiFENesin-dextromethorphan, ondansetron (ZOFRAN) IV, mouth rinse

## 2022-10-06 NOTE — TOC Transition Note (Signed)
Transition of Care [facility]) - CM/SW Discharge Note   Patient Details  Name: [name] MRN: [id number] Date of Birth: 07/05/1956  [facility] of [facility] CM/SW Contact:  Bjorn Pippin, LCSW Phone Number: 10/06/2022, 2:49 PM   Clinical Narrative:    Patient will DC [id number] SNF Anticipated DC date: 10/06/2022 Family notified: VM left with [name] (sister) Transport by: [name]   Per MD patient ready for DC to [facility]. RN, patient, patient's family, and facility notified of DC. Discharge Summary and FL2 sent to facility. RN to call report prior to discharge [phone]. DC packet on chart. Ambulance transport requested for patient.   CSW will sign off for now as social work intervention is no longer needed. Please consult [place] again if new needs arise.    Final next level of care: Skilled Nursing Facility Barriers to Discharge: No Barriers Identified   Patient Goals and CMS Choice      Discharge Placement                Patient chooses bed at: [facility] Patient to be transferred to facility by: [place] Name of family member notified: VM left with [name] (sister) Patient and family notified of of transfer: 10/06/22  Discharge Plan and Services Additional resources added to the After Visit Summary for                                       Social Determinants of Health (SDOH) Interventions SDOH Screenings   Food Insecurity: No Food Insecurity (09/23/2022)  Housing: Low Risk  (09/23/2022)  Transportation Needs: No Transportation Needs (09/23/2022)  Utilities: Not At Risk (09/23/2022)  Tobacco Use: Low Risk  (09/24/2022)     Readmission Risk Interventions     No data to display          Beckey Rutter, MSW, LCSWA, LCASA Transitions of Care  Clinical Social Worker I

## 2022-10-10 LAB — MISC LABCORP TEST (SEND OUT): Labcorp test code: 86020

## 2022-10-12 NOTE — Progress Notes (Unsigned)
Cardiology Office Note:    Date:  10/19/2022   ID:  [name], DOB [birth year], MRN [id number]  PCP:  Patient, No Pcp Per  Cardiologist:  Elouise Munroe, MD  Electrophysiologist:  None   Referring MD: No ref. provider found   Chief Complaint: hospital follow-up for NSTEMI and atrial fibrillation  History of Present Illness:    [name] is a 67 y.o. 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 Margaretann Loveless and presents today for hospital follow-up of NSTEMI and atrial fibrillation.   Patient was admitted in 11/2019 with purulent cellulitis of right lower extremity. He was also noted to be grossly volume overloaded and Echo showed LVEF of 30-35% with grade 2 diastolic dysfunction. Also found to have CKD stage V and patient was started on hemodialysis. Also required blood transfusion due to hemoglobin of 7.1 due to anemia from of CKD. After dialysis was established, he underwent left heart catheterization which showed moderate multivessel CAD including 50% stenosis of mid to distal left main. Cardiomyopathy seemed to be out of proportion to CAD but felt to possibly be a component of LV dysfunction. LVEDP was elevated. Medical therapy was recommended. He was discharged on Coreg 25mg  twice daily, Imdur 15mg  daily, and Lipitor 20mg  daily. Repeat Echo in 04/2020 showed LVEF of 50-55% with normal wall motion and grade 2 diastolic dysfunction but there was still evidence of high left heart filling pressures (although improved from 10/2019).   Patient was last seen by me in 04/2020 at which time he appeared very  disheveled. He described very brief episodes of atypical chest pain but was otherwise, soing well. He was started on Hydralazine for additional BP control.  He was not seen again until recent hospitalization. He was admitted from 09/22/2022 to 10/06/2022 for sepsis secondary to hospital acquired pneumonia after presenting with hypoxia and worsening cough. He ultimately required BiPAP. Chest CTA was negative for PE but showed findings consistent with multifocal pneumonia associated with bilateral pleural effusions. BNP was elevated at 3,189. High-sensitivity troponin was checked in light of patient's known CAD and was elevated at 5,494 >> 6,320. Echo showed drop in EF to 30-35% with severe hypokinesis of the mid to apical septa, mid to apical anterior, and mid to apical inferior LV segments and apex. Findings were concerning for possible LAD disease vs Takostubo cardiomyopathy. 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 He was not felt to be a good cardiac catheterization candidate. He was treated with IV Heparin for 48 hours and was started on Toprol-XL.Volume status was managed via hemodialysis. GDMT was limited by hypotension and ESRD. Plan was for repeat Echo as an outpatient with possible cardiac catheterization if EF remained depressed. His hospitalization was also complicated by new onset atrial fibrillation with RVR with associated hypotension (converted back to sinus rhythm with IV Amiodarone), age indeterminate DVT in the right popliteal vein, and abdominal distension with with ileus noted on KUB requiring NG tube placement, and urinary retention. There was some concern for Lambert-Eaton or myasthenia gravis given weakness, constipation, and respiratory failure and antibody labs were ordered.Marland Kitchen He  was discharged on 1 L of O2 via nasal cannula as well as Amiodarone and Eliquis. Home Amlodipine, Coreg, Hydralazine, and Imdur were all stopped at discharge due to  hypotension.  Patient presents today for follow-up. Patient lives at [place]. He is here alone. He looks very disheveled. He reports doing well from a cardiac standpoint and denies any chest pain, shortness of breath, orthopnea, PND, edema, palpitations, lightheadedness, dizziness, or syncope. His only complaint today is persistent weakness which he states he has had since recent admission. He is essentially wheelchair bound and only transfers in an out of his wheelchair but he states this weakness is making this very hard.  Past Medical History:  Diagnosis Date   CHF (congestive heart failure) (HCC)    Chronic kidney disease    Diabetes mellitus without complication ([place])    PATIENT JUST LEARNED HE WAS DIABETIC   Hepatitis    Hyperlipemia    Hypertension    Necrotizing fasciitis ([place])    Pneumonia    HX OF PNA    Past Surgical History:  Procedure Laterality Date   AMPUTATION Left 09/11/2013   Procedure: AMPUTATION BELOW KNEE;  Surgeon: Marianna Payment, MD;  Location: [facility];  Service: Orthopedics;  Laterality: Left;   AMPUTATION Right 12/18/2013   Procedure: RIGHT FOOT 1,2, TOE AMPUTATION  5th toe RAY AMPUTATION;  Surgeon: Marianna Payment, MD;  Location: [facility];  Service: Orthopedics;  Laterality: Right;   AV FISTULA PLACEMENT Left 12/11/2019   Procedure: LEFT BRACHIOCEPHALIC FISTULA CREATION;  Surgeon: Rosetta Posner, MD;  Location: [facility];  Service: Vascular;  Laterality: Left;   CHOLECYSTECTOMY     I & D EXTREMITY Left 09/03/2013   Procedure: IRRIGATION AND DEBRIDEMENT EXTREMITY;  Surgeon: Marianna Payment, MD;  Location: [facility];  Service: Orthopedics;  Laterality: Left;   I & D EXTREMITY Left 09/11/2013   Procedure: LEFT FOOT IRRIGATION AND DEBRIDEMENT;  Surgeon: Marianna Payment, MD;  Location: [facility];  Service: Orthopedics;  Laterality: Left;   INSERTION OF DIALYSIS CATHETER  12/11/2019   Procedure: Insertion Of Dialysis Catheter;  Surgeon: Rosetta Posner, MD;  Location: [facility];   Service: Vascular;;   INSERTION OF DIALYSIS CATHETER Right 07/11/2022   Procedure: INSERTION OF TUNNELED DIALYSIS CATHETER;  Surgeon: Angelia Mould, MD;  Location: [place];  Service: Vascular;  Laterality: Right;   LEFT HEART CATH AND CORONARY ANGIOGRAPHY N/A 12/13/2019   Procedure: LEFT HEART CATH AND CORONARY ANGIOGRAPHY;  Surgeon: Jettie Booze, MD;  Location: [place];  Service: Cardiovascular;  Laterality: N/A;   REVISON OF ARTERIOVENOUS FISTULA Left 07/11/2022   Procedure: REVISON OF LEFT ARM ARTERIOVENOUS FISTULA;  Surgeon: Angelia Mould, MD;  Location: [facility];  Service: Vascular;  Laterality: Left;   RIGHT HEART CATH N/A 12/13/2019   Procedure: RIGHT HEART CATH;  Surgeon: Jettie Booze, MD;  Location: [place] [facility];  Service: Cardiovascular;  Laterality: N/A;    Current Medications: Current Meds  Medication Sig   amiodarone (PACERONE) 200 MG tablet Take 1 tablet (200 mg total) by mouth daily.   apixaban (ELIQUIS) 5 MG TABS tablet Take 1 tablet (5 mg total) by mouth 2 (two) times daily.   atorvastatin (LIPITOR) 20 MG tablet Take 1 tablet (20 mg total) by mouth daily.   calcitRIOL (ROCALTROL) 0.5 MCG capsule Take 1 capsule (0.5 mcg total) by mouth Every Tuesday,Thursday,and Saturday with dialysis.   sevelamer carbonate (RENVELA) 800 MG tablet Take 1  tablet (800 mg total) by mouth 3 (three) times daily with meals.     Allergies:   Tramadol   Social History   Socioeconomic History   Marital status: Single    Spouse name: Not on file   Number of children: Not on file   Years of education: Not on file   Highest education level: Not on file  Occupational History   Not on file  Tobacco Use   Smoking status: Never   Smokeless tobacco: Never  Vaping Use   Vaping Use: Never used  Substance and Sexual Activity   Alcohol use: No   Drug use: No   Sexual activity: Not on file  Other Topics Concern   Not on file  Social History Narrative    ** Merged History Encounter **       Social Determinants of Health   Financial Resource Strain: Not on file  Food Insecurity: No Food Insecurity (09/23/2022)   Hunger Vital Sign    Worried About Running Out of Food in the Last Year: Never true    Ran Out of Food in the Last Year: Never true  Transportation Needs: No Transportation Needs (09/23/2022)   PRAPARE - Hydrologist (Medical): No    Lack of Transportation (Non-Medical): No  Physical Activity: Not on file  Stress: Not on file  Social Connections: Not on file     Family History: The patient's family history includes Dementia in his mother; Diabetes type II in his mother; Heart disease in his father.  ROS:   Please see the history of present illness.     EKGs/Labs/Other Studies Reviewed:    The following studies were reviewed:  Left Cardiac Catheterization 12/13/2019: RPDA lesion is 50% stenosed. Mid LM to Dist LM lesion is 50% stenosed. Prox Cx to Mid Cx lesion is 100% stenosed. Mid LAD lesion is 75% stenosed. Dist LAD-1 lesion is 50% stenosed. Dist LAD-2 lesion is 90% stenosed. LV end diastolic pressure is moderately elevated. There is no aortic valve stenosis. Diffuse CAD. He denied angina. He has had DOE. Hemodynamic findings consistent with moderate pulmonary hypertension. Ao sat 95%, PA sat 62%, mean PA pressure 36 mm Hg; mean PCWP 23 mm Hg; CO 7.0 L/min; CI 4   Moderate CAD.  Cardiomyopathy seems out of proportion to CAD, but ischemia may be a component of the LV dysfunction.     Elevated LVEDP.  Patient lying on a wedge due to shortness of breath.  Continue medical therapy for LV dysfunction.     He did not want any family notified.   Diagnostic Dominance: Right    _______________  Echocardiogram 09/23/2022: Impressions: 1. There is severe hypokinesis of the mid-to-apical septal, mid-to-apical  anterior, and mid-to-apical inferior LV segments and apex. No LV thrombus   visualized. Findings concerning for possible LAD disease vs takostubo  cardiomyopathy. Left ventricular  ejection fraction, by estimation, is 30 to 35%. The left ventricle has  moderately decreased function. The left ventricle demonstrates regional  wall motion abnormalities (see scoring diagram/findings for description).  Left ventricular diastolic parameters   are consistent with Grade II diastolic dysfunction (pseudonormalization).  The E/e' is 17.2.   2. Right ventricular systolic function is mildly reduced. The right  ventricular size is normal.   3. Left atrial size was mildly dilated.   4. The mitral valve is grossly normal. Mild mitral valve regurgitation.   5. The aortic valve is tricuspid. Aortic valve regurgitation is not  visualized. Aortic valve sclerosis is present, with no evidence of aortic  valve stenosis.   6. The inferior vena cava is dilated in size with <50% respiratory  variability, suggesting right atrial pressure of 15 mmHg.   Comparison(s): Compared to prior TTE in 2021, the EF has dropped from  50-55% to 30-35% with WMA as detailed above.  _______________  Lower Extremity Venous Doppler 09/23/2022: Summary: BILATERAL:  -No evidence of popliteal cyst, bilaterally.  RIGHT:  - Findings consistent with age indeterminate deep vein thrombosis  involving the right popliteal vein.    LEFT:  - There is no evidence of deep vein thrombosis in the lower extremity.   EKG:  EKG ordered today. EKG personally reviewed and demonstrates normal sinus rhythm, rate 96 bpm, with Q waves in inferior leads, possible Q waves in anterior leads, and  isolated T wave inversions in lead III. QTc 452 ms.  Recent Labs: 09/23/2022: ALT 16 09/27/2022: B Natriuretic Peptide 3,505.9 09/30/2022: TSH 1.458 10/04/2022: BUN 82; Creatinine, Ser 7.49; Magnesium 1.9; Potassium 4.4; Sodium 133 10/06/2022: Hemoglobin 9.6; Platelets 216  Recent Lipid Panel No results found for: "CHOL", "TRIG", "HDL",  "CHOLHDL", "VLDL", "LDLCALC", "LDLDIRECT"  Physical Exam:    Vital Signs: BP 110/60   Pulse 95   Ht 5\' 3"  (1.6 m)   SpO2 98%   BMI 26.56 kg/m     Wt Readings from Last 3 Encounters:  10/06/22 149 lb 14.6 oz (68 kg)  07/08/22 160 lb (72.6 kg)  03/23/22 160 lb (72.6 kg)     General: 67 y.o. disheveled Caucasian male in no acute distress. HEENT: Normocephalic and atraumatic. Sclera clear.  Neck: Supple.Marland Kitchen No JVD. Heart: RRR. Distinct S1 and S2. No murmurs, gallops, or rubs. Radial pulses 2+ and equal bilaterally. Lungs: No increased work of breathing. Clear to ausculation bilaterally. No wheezes, rhonchi, or rales.  Abdomen: Soft, non-distended, and non-tender to palpation.  Extremities: No lower extremity edema.  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  Skin: Warm and dry. Neuro: Alert and oriented x3. No focal deficits. Psych: Normal affect. Responds appropriately.   Assessment:    1. Chronic combined systolic and diastolic CHF (congestive heart failure) ([place])   2. Coronary artery disease involving native coronary artery of native heart without angina pectoris   3. Paroxysmal atrial fibrillation (HCC)   4. PVD (peripheral vascular disease) ([place])   5. Primary hypertension   6. Type 2 diabetes mellitus with complication, without long-term current use of insulin (HCC)   7. ESRD (end stage renal disease) ([place])   8. Weakness     Plan:    Chronic Combined CHF Initially diagnosed in 11/2019 when found to have an EF of 30-35%. Catht at that time showed moderate CAD but degree of cardiomyopathy was felt to be out of proportion to CAD. He was started on GDMT and EF improved to 50-55% in 04/2020. During recent admission in 09/2022 for hospital acquired pneumonia and sepsis, EF was found to be back done. Echo showed 30-35% with severe hypokinesis of the mid to apical septa, mid to apical anterior, and mid to apical inferior LV segments and apex. Findings were concerning for  possible LAD disease vs Takostubo cardiomyopathy. 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  - Appears euvolemic on exam. - Volume status managed via dialysis.  - GDMT limited by hypotension and ESRD - not currently on any GDMT. - Will repeat Echo next month. If EF still down, may need to consider  ischemic evaluation.   CAD Recent NSTEMI  LHC in 11/2019 showed moderate CAD including 50% stenosis of mid to distal left main and occluded CX. Medical therapy recommended. During recent admission in 09/2022 for hospital acquired pneumonia and sepsis, he was found to have a markedly elevated troponin peaking in the 6,000s. Echo showed reduced EF and wall motion abnormalities consistent with Takotubo cardiomyopathy vs less likely LAD disease. He was treated medically with IV Heparin for 48 hours and plan was for repeat Echo as an outpatient after recovering from pneumonia with consideration for cardiac catheterization if EF still depressed.  - No chest pain.  - No aspirin given need for DOAC.  - Continue Lipitor 20mg  daily.  - Of note, I don't see any lipid panel in the [facility] system. Patient is not fasting today so have asked him to return for fasting lipid panel and LFTs. He can do this when he comes in for Echo or ABIs.  Paroxysmal Atrial Fibrillation Patient was noted to have new onset atrial fibrillation during recent admission and converted with IV Amiodarone - Maintaining sinus rhythm on EKG today.  - Continue Amiodarone 200mg  daily.  - Continue Eliquis 5mg  twice daily.    PVD S/p left BKA and multiple right toe amputations in 2015 due to gangrene and necrotizing fasciitis. Last ABI in 2021 showed non-compressible right lower extremity arteries.  - Difficult to palpate posterior tibial and distal pedal pulse on the right but no signs of ischemic leg and no leg pain. - No aspirin given need for DOAC. - Continue statin.  - Will repeat ABIs.   Hypertension Patient has a history of  hypertension but during recent admission struggled with hypotension requiring discontinuation of all antihypertensives.  - BP stable today at 110/60.   Type 2 Diabetes Mellitus  Hemoglobin A1c 5.6 in 09/2022 - Management per PCP  ESRD On hemodialysis T/Th/S. - Management per Nephrology.  Weakness Patient's only complaint today is continued weakness which he started having during recent admission. Per review of hospital notes, there was concern about for Lambert-Eaton or myasthenia gravis given weakness, constipation, and respiratory failure.  - I don't think this is cardiac in nature. Recommended following up with physician at [facility] (he does not have a PCP but there is a physician at [place] where he lives).  Disposition: Follow up in 2-3 months.    Medication Adjustments/Labs and Tests Ordered: Current medicines are reviewed at length with the patient today.  Concerns regarding medicines are outlined above.  Orders Placed This Encounter  Procedures   Lipid panel   Hepatic function panel   EKG 12-Lead   ECHOCARDIOGRAM COMPLETE   VAS [place] ABI WITH/WO TBI   VAS [place] LOWER EXTREMITY ARTERIAL DUPLEX   No orders of the defined types were placed in this encounter.   Patient Instructions  Medication Instructions:  TAKE AMIODARONE 200MG  DAILY ONLY *If you need a refill on your cardiac medications before your next appointment, please call your pharmacy*  Lab Work: FASTING LIPID AND LFT WHEN YOU HAVE YOUR ABI's If you have labs (blood work) drawn today and your tests are completely normal, you will receive your results only by:  [place] (if you have MyChart) OR  A paper copy in the mail  If you have any lab test that is abnormal or we need to change your treatment, we will call you to review the results.  Testing/Procedures: Your physician has requested that you have an ankle brachial index (ABI/LEA). During this  test an ultrasound and blood pressure cuff are used to  evaluate the arteries that supply the arms and legs with blood. Allow thirty minutes for this exam. There are no restrictions or special instructions. PLEASE SCHEDULE IN THE AM.  Echocardiogram - Your physician has requested that you have an echocardiogram. Echocardiography is a painless test that uses sound waves to create images of your heart. It provides your doctor with information about the size and shape of your heart and how well your heart's chambers and valves are working. This procedure takes approximately one hour. There are no restrictions for this procedure.    Follow-Up: At [facility], you and your health needs are our priority.  As part of our continuing mission to provide you with exceptional heart care, we have created designated Provider Care Teams.  These Care Teams include your primary Cardiologist (physician) and Advanced Practice Providers (APPs -  Physician Assistants and Nurse Practitioners) who all work together to provide you with the care you need, when you need it.  We recommend signing up for the patient portal called "MyChart".  Sign up information is provided on this After Visit Summary.  MyChart is used to connect with patients for Virtual Visits (Telemedicine).  Patients are able to view lab/test results, encounter notes, upcoming appointments, etc.  Non-urgent messages can be sent to your provider as well.   To learn more about what you can do with MyChart, go to [email or web address].    Your next appointment:   [place]   Provider:   Elouise Munroe, MD  or Sande Rives, PA-C        Other Instructions     Signed, Darreld Mclean, PA-C  10/19/2022 1:33 PM    [place]

## 2022-10-19 ENCOUNTER — Encounter: Payer: Self-pay | Admitting: Student

## 2022-10-19 ENCOUNTER — Ambulatory Visit: Payer: Medicare Other | Attending: Student | Admitting: Student

## 2022-10-19 VITALS — BP 110/60 | HR 95 | Ht 63.0 in

## 2022-10-19 DIAGNOSIS — I480 Paroxysmal atrial fibrillation: Secondary | ICD-10-CM | POA: Diagnosis present

## 2022-10-19 DIAGNOSIS — I5042 Chronic combined systolic (congestive) and diastolic (congestive) heart failure: Secondary | ICD-10-CM

## 2022-10-19 DIAGNOSIS — N186 End stage renal disease: Secondary | ICD-10-CM | POA: Insufficient documentation

## 2022-10-19 DIAGNOSIS — E118 Type 2 diabetes mellitus with unspecified complications: Secondary | ICD-10-CM | POA: Diagnosis present

## 2022-10-19 DIAGNOSIS — I2510 Atherosclerotic heart disease of native coronary artery without angina pectoris: Secondary | ICD-10-CM | POA: Insufficient documentation

## 2022-10-19 DIAGNOSIS — I739 Peripheral vascular disease, unspecified: Secondary | ICD-10-CM | POA: Insufficient documentation

## 2022-10-19 DIAGNOSIS — R531 Weakness: Secondary | ICD-10-CM | POA: Insufficient documentation

## 2022-10-19 DIAGNOSIS — I10 Essential (primary) hypertension: Secondary | ICD-10-CM | POA: Diagnosis present

## 2022-10-19 NOTE — Patient Instructions (Signed)
Medication Instructions:  TAKE AMIODARONE 200MG  DAILY ONLY *If you need a refill on your cardiac medications before your next appointment, please call your pharmacy*  Lab Work: FASTING LIPID AND LFT WHEN YOU HAVE YOUR ABI's If you have labs (blood work) drawn today and your tests are completely normal, you will receive your results only by:  [place] (if you have MyChart) OR  A paper copy in the mail  If you have any lab test that is abnormal or we need to change your treatment, we will call you to review the results.  Testing/Procedures: Your physician has requested that you have an ankle brachial index (ABI/LEA). During this test an ultrasound and blood pressure cuff are used to evaluate the arteries that supply the arms and legs with blood. Allow thirty minutes for this exam. There are no restrictions or special instructions. PLEASE SCHEDULE IN THE AM.  Echocardiogram - Your physician has requested that you have an echocardiogram. Echocardiography is a painless test that uses sound waves to create images of your heart. It provides your doctor with information about the size and shape of your heart and how well your heart's chambers and valves are working. This procedure takes approximately one hour. There are no restrictions for this procedure.    Follow-Up: At [facility], you and your health needs are our priority.  As part of our continuing mission to provide you with exceptional heart care, we have created designated Provider Care Teams.  These Care Teams include your primary Cardiologist (physician) and Advanced Practice Providers (APPs -  Physician Assistants and Nurse Practitioners) who all work together to provide you with the care you need, when you need it.  We recommend signing up for the patient portal called "MyChart".  Sign up information is provided on this After Visit Summary.  MyChart is used to connect with patients for Virtual Visits (Telemedicine).  Patients  are able to view lab/test results, encounter notes, upcoming appointments, etc.  Non-urgent messages can be sent to your provider as well.   To learn more about what you can do with MyChart, go to [email or web address].    Your next appointment:   [place]   Provider:   Elouise Munroe, MD  or Sande Rives, PA-C        Other Instructions

## 2022-11-16 ENCOUNTER — Ambulatory Visit (HOSPITAL_COMMUNITY)
Admission: RE | Admit: 2022-11-16 | Discharge: 2022-11-16 | Disposition: A | Discharge status: Home / Self Care | Payer: No Typology Code available for payment source | Source: Ambulatory Visit | Attending: Cardiovascular Disease | Admitting: Cardiovascular Disease

## 2022-11-16 DIAGNOSIS — I739 Peripheral vascular disease, unspecified: Secondary | ICD-10-CM

## 2022-11-18 ENCOUNTER — Ambulatory Visit (HOSPITAL_COMMUNITY): Payer: No Typology Code available for payment source | Attending: Student

## 2022-11-18 DIAGNOSIS — I5042 Chronic combined systolic (congestive) and diastolic (congestive) heart failure: Secondary | ICD-10-CM | POA: Diagnosis present

## 2022-11-18 LAB — ECHOCARDIOGRAM COMPLETE
Area-P 1/2: 3.16 cm2
S' Lateral: 3.6 cm

## 2022-11-18 MED ORDER — PERFLUTREN LIPID MICROSPHERE
1.0000 mL | INTRAVENOUS | Status: AC | PRN
  Administered 2022-11-18: 6 mL via INTRAVENOUS

## 2022-11-18 MED ORDER — PERFLUTREN LIPID MICROSPHERE: 1.0000 mL | INTRAVENOUS | Status: AC | PRN

## 2022-12-17 NOTE — Progress Notes (Deleted)
Cardiology Office Note:    Date:  12/17/2022   ID:  Astrid Divine, DOB [birth year], MRN [id number]  PCP:  Patient, No Pcp Per  Cardiologist:  Parke Poisson, MD  Electrophysiologist:  None   Referring MD: No ref. provider found   Chief Complaint: follow-up of CAD and CHF  History of Present Illness:    [name] is a 67 y.o. 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 Jacques Navy and presents today for follow-up of CAD and CHF.  Patient was admitted in 11/2019 with purulent cellulitis of right lower extremity. He was also noted to be grossly volume overloaded and Echo showed LVEF of 30-35% with grade 2 diastolic dysfunction. Also found to have CKD stage V and patient was started on hemodialysis. Also required blood transfusion due to hemoglobin of 7.1 due to anemia from of CKD. After dialysis was established, he underwent left heart catheterization which showed moderate multivessel CAD including 50% stenosis of mid to distal left main. Cardiomyopathy seemed to be out of proportion to CAD but felt to possibly be a component of LV dysfunction. LVEDP was elevated. Medical therapy was recommended. He was discharged on Coreg 25mg  twice daily, Imdur 15mg  daily, and Lipitor 20mg  daily. Repeat Echo in 04/2020 showed LVEF of 50-55% with normal wall motion and grade 2 diastolic dysfunction but there was still evidence of high left heart filling pressures (although improved from 10/2019).   He was lost to follow-up until recent admission in 09/2022 for sepsis secondary to hospital acquired pneumonia after presenting with  hypoxia and worsening cough. He ultimately required BiPAP. Chest CTA was negative for PE but showed findings consistent with multifocal pneumonia associated with bilateral pleural effusions. BNP was elevated at 3,189. High-sensitivity troponin was checked in light of patient's known CAD and was elevated at 5,494 >> 6,320. Echo showed drop in EF to 30-35% with severe hypokinesis of the mid to apical septa, mid to apical anterior, and mid to apical inferior LV segments and apex. Findings were concerning for possible LAD disease vs Takostubo cardiomyopathy. 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 He was not felt to be a good cardiac catheterization candidate. He was treated with IV Heparin for 48 hours and was started on Toprol-XL.Volume status was managed via hemodialysis. GDMT was limited by hypotension and ESRD. Plan was for repeat Echo as an outpatient with possible cardiac catheterization if EF remained depressed. His hospitalization was also complicated by new onset atrial fibrillation with RVR with associated hypotension (converted back to sinus rhythm with IV Amiodarone), age indeterminate DVT in the right popliteal vein, and abdominal distension with with ileus noted on KUB requiring NG tube placement, and urinary retention. There was some concern for Lambert-Eaton or myasthenia gravis given weakness, constipation, and respiratory failure and antibody labs were ordered.Marland Kitchen He was discharged on 1 L of O2 via nasal cannula as well as Amiodarone and Eliquis. Home Amlodipine, Coreg, Hydralazine, and Imdur were all stopped at discharge due to hypotension.   Patient was seen by me for follow-up in 09/2022 at which time he looked very disheveled. However, he reported doing  well from a cardiac standpoint. He continued to complain of weakness and was essentially wheelchair bound. His weakness was not felt to be cardiac in nature and he was advised to follow-up up with the  physician at [place] where he lives. Repeat Echo was ordered to reassess LV function. Repeat ABIs were ordered as well as he was overdue for this. Echo showed his EF had normalized to 50-55%. ABIs showed non-compressible right lower extremity arteries and dopplers showed  Calcific atherosclerosis throughout the right lower extremity arteries with segment occlusion of the posterior tibial artery. Additional tibial vessel disease was unable to be rule out due to heavily calcified vessels.   Patient presents today for follow-up. ***  CAD Recent NSTEMI  LHC in 11/2019 showed moderate CAD including 50% stenosis of mid to distal left main and occluded CX. Medical therapy recommended. During recent admission in 09/2022 for hospital acquired pneumonia and sepsis, he was found to have a markedly elevated troponin peaking in the 6,000s. Echo showed reduced EF and wall motion abnormalities consistent with Takotubo cardiomyopathy vs less likely LAD disease. He was treated medically with IV Heparin for 48 hours and plan was for repeat Echo as an outpatient after recovering from pneumonia with consideration for cardiac catheterization if EF still depressed. Repeat Echo in 10/2022 with normalization of EF. - No chest pain.  - No aspirin given need for DOAC.  - Continue Lipitor 20mg  daily.   Chronic Combined CHF Initially diagnosed in 11/2019 when found to have an EF of 30-35%. Catht at that time showed moderate CAD but degree of cardiomyopathy was felt to be out of proportion to CAD. He was started on GDMT and EF improved to 50-55% in 04/2020. During recent admission in 09/2022 for hospital acquired pneumonia and sepsis, EF was found to be back done. Echo showed 30-35% with severe hypokinesis of the mid to apical septa, mid to apical anterior, and mid to apical inferior LV segments and apex. Findings were concerning for possible LAD disease vs Takostubo cardiomyopathy. 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  Repeat Echo in 10/2022 showed improved LVEF to 50-55% with no regional wall motion abnormalities and grade 1 diastolic dysfunction.  - Appears euvolemic on exam. - Volume status managed via dialysis.  - GDMT limited by hypotension and ESRD - not currently on any GDMT.   Paroxysmal Atrial Fibrillation Patient was noted to have new onset atrial fibrillation during recent admission and converted with IV Amiodarone - Maintaining sinus rhythm one exam. *** - Continue Amiodarone 200mg  daily.  - Continue Eliquis 5mg  twice daily.    PVD S/p left BKA and multiple right toe amputations in 2015 due to gangrene and necrotizing fasciitis. Recent lower extremity arterial dopplers in 10/2022 showed calcific atherosclerosis throughout the right lower extremity arteries with segment occlusion of the posterior tibial artery. Additional tibial vessel disease was unable to be rule out due to heavily calcified vessels. ABIs showed non-compressible right lower extremity arteries. - No evidence of acute ischemia of right leg on exam. *** - No aspirin given need for DOAC. - Continue statin.  - Advised patient to follow-up with Vascular Surgery (who he already follows with for his dialysis fistula) if he develops any pain in his legs or notices any skin discoloration or slow healing wounds. ***    Hypertension Patient has a history of hypertension but during recent admission in 09/2022 struggled with hypotension requiring discontinuation of all antihypertensives.  - BP ***  Type 2 Diabetes Mellitus  Hemoglobin A1c 5.6 in 09/2022 - Management per PCP   ESRD On hemodialysis T/Th/S. - Management per Nephrology.    Past Medical History:  Diagnosis Date   CHF (congestive heart failure) (HCC)    Chronic kidney disease    Diabetes mellitus without complication (HCC)    PATIENT JUST LEARNED HE WAS DIABETIC   Hepatitis    Hyperlipemia    Hypertension    Necrotizing fasciitis (HCC)    Pneumonia    HX OF PNA     Past Surgical History:  Procedure Laterality Date   AMPUTATION Left 09/11/2013   Procedure: AMPUTATION BELOW KNEE;  Surgeon: Cheral Almas, MD;  Location: [facility] OR;  Service: Orthopedics;  Laterality: Left;   AMPUTATION Right 12/18/2013   Procedure: RIGHT FOOT 1,2, TOE AMPUTATION  5th toe RAY AMPUTATION;  Surgeon: Cheral Almas, MD;  Location: [facility] OR;  Service: Orthopedics;  Laterality: Right;   AV FISTULA PLACEMENT Left 12/11/2019   Procedure: LEFT BRACHIOCEPHALIC FISTULA CREATION;  Surgeon: Larina Earthly, MD;  Location: [facility] OR;  Service: Vascular;  Laterality: Left;   CHOLECYSTECTOMY     I & D EXTREMITY Left 09/03/2013   Procedure: IRRIGATION AND DEBRIDEMENT EXTREMITY;  Surgeon: Cheral Almas, MD;  Location: [facility] OR;  Service: Orthopedics;  Laterality: Left;   I & D EXTREMITY Left 09/11/2013   Procedure: LEFT FOOT IRRIGATION AND DEBRIDEMENT;  Surgeon: Cheral Almas, MD;  Location: [facility] OR;  Service: Orthopedics;  Laterality: Left;   INSERTION OF DIALYSIS CATHETER  12/11/2019   Procedure: Insertion Of Dialysis Catheter;  Surgeon: Larina Earthly, MD;  Location: [facility] OR;  Service: Vascular;;   INSERTION OF DIALYSIS CATHETER Right 07/11/2022   Procedure: INSERTION OF TUNNELED DIALYSIS CATHETER;  Surgeon: Chuck Hint, MD;  Location: [facility] OR;  Service: Vascular;  Laterality: Right;   LEFT HEART CATH AND CORONARY ANGIOGRAPHY N/A 12/13/2019   Procedure: LEFT HEART CATH AND CORONARY ANGIOGRAPHY;  Surgeon: Corky Crafts, MD;  Location: [facility] INVASIVE CV LAB;  Service: Cardiovascular;  Laterality: N/A;   REVISON OF ARTERIOVENOUS FISTULA Left 07/11/2022   Procedure: REVISON OF LEFT ARM ARTERIOVENOUS FISTULA;  Surgeon: Chuck Hint, MD;  Location: [facility] OR;  Service: Vascular;  Laterality: Left;   RIGHT HEART CATH N/A 12/13/2019   Procedure: RIGHT HEART CATH;  Surgeon: Corky Crafts, MD;  Location: [facility] INVASIVE CV LAB;  Service: Cardiovascular;  Laterality: N/A;     Current Medications: No outpatient medications have been marked as taking for the 12/30/22 encounter (Appointment) with Corrin Parker, PA-C.     Allergies:   Tramadol   Social History   Socioeconomic History   Marital status: Single    Spouse name: Not on file   Number of children: Not on file   Years of education: Not on file   Highest education level: Not on file  Occupational History   Not on file  Tobacco Use   Smoking status: Never   Smokeless tobacco: Never  Vaping Use   Vaping Use: Never used  Substance and Sexual Activity   Alcohol use: No   Drug use: No   Sexual activity: Not on file  Other Topics Concern   Not on file  Social History Narrative   ** Merged History Encounter **       Social Determinants of Health   Financial Resource Strain: Not on file  Food Insecurity: No Food Insecurity (09/23/2022)   Hunger Vital Sign    Worried About  Running Out of Food in the Last Year: Never true    Ran Out of Food in the Last Year: Never true  Transportation Needs: No Transportation Needs (09/23/2022)   PRAPARE - Administrator, Civil Service (Medical): No    Lack of Transportation (Non-Medical): No  Physical Activity: Not on file  Stress: Not on file  Social Connections: Not on file     Family History: The patient's family history includes Dementia in his mother; Diabetes type II in his mother; Heart disease in his father.  ROS:   Please see the history of present illness.     EKGs/Labs/Other Studies Reviewed:    The following studies were reviewed:  Left Cardiac Catheterization 12/13/2019: RPDA lesion is 50% stenosed. Mid LM to Dist LM lesion is 50% stenosed. Prox Cx to Mid Cx lesion is 100% stenosed. Mid LAD lesion is 75% stenosed. Dist LAD-1 lesion is 50% stenosed. Dist LAD-2 lesion is 90% stenosed. LV end diastolic pressure is moderately elevated. There is no aortic valve stenosis. Diffuse CAD. He denied angina. He has had  DOE. Hemodynamic findings consistent with moderate pulmonary hypertension. Ao sat 95%, PA sat 62%, mean PA pressure 36 mm Hg; mean PCWP 23 mm Hg; CO 7.0 L/min; CI 4   Moderate CAD.  Cardiomyopathy seems out of proportion to CAD, but ischemia may be a component of the LV dysfunction.     Elevated LVEDP.  Patient lying on a wedge due to shortness of breath.  Continue medical therapy for LV dysfunction.     He did not want any family notified.    Diagnostic Dominance: Right    _______________   Echocardiogram 09/23/2022: Impressions: 1. There is severe hypokinesis of the mid-to-apical septal, mid-to-apical  anterior, and mid-to-apical inferior LV segments and apex. No LV thrombus  visualized. Findings concerning for possible LAD disease vs takostubo  cardiomyopathy. Left ventricular  ejection fraction, by estimation, is 30 to 35%. The left ventricle has  moderately decreased function. The left ventricle demonstrates regional  wall motion abnormalities (see scoring diagram/findings for description).  Left ventricular diastolic parameters   are consistent with Grade II diastolic dysfunction (pseudonormalization).  The E/e' is 17.2.   2. Right ventricular systolic function is mildly reduced. The right  ventricular size is normal.   3. Left atrial size was mildly dilated.   4. The mitral valve is grossly normal. Mild mitral valve regurgitation.   5. The aortic valve is tricuspid. Aortic valve regurgitation is not  visualized. Aortic valve sclerosis is present, with no evidence of aortic  valve stenosis.   6. The inferior vena cava is dilated in size with <50% respiratory  variability, suggesting right atrial pressure of 15 mmHg.   Comparison(s): Compared to prior TTE in 2021, the EF has dropped from  50-55% to 30-35% with WMA as detailed above.  _______________   Lower Extremity Venous Doppler 09/23/2022: Summary: BILATERAL:  -No evidence of popliteal cyst, bilaterally.  RIGHT:   - Findings consistent with age indeterminate deep vein thrombosis  involving the right popliteal vein.    LEFT:  - There is no evidence of deep vein thrombosis in the lower extremity. _______________   Lower Extremity Arterial Doppler and ABIs 11/16/2022: Summary:  Right: Calcific atherosclerosis throughout the right lower extremity  arteries. Segmental occlusion of the posterior tibial artery. Unable to  rule out additional tibial vessel disease due to heavily calcified vessels  with areas of shadowing.   ABI Summary: Right: Resting right  ankle-brachial index indicates noncompressible right  lower extremity arteries.   Left: Below-knee amputation.  _______________  Echocardiogram 11/18/2022: Impressions: 1. Left ventricular ejection fraction, by estimation, is 50 to 55%. The  left ventricle has low normal function. The left ventricle has no regional  wall motion abnormalities. Left ventricular diastolic parameters are  consistent with Grade I diastolic  dysfunction (impaired relaxation).   2. Right ventricular systolic function is normal. The right ventricular  size is normal.   3. The mitral valve is normal in structure. Trivial mitral valve  regurgitation. No evidence of mitral stenosis.   4. The aortic valve is tricuspid. There is mild calcification of the  aortic valve. Aortic valve regurgitation is not visualized. Aortic valve  sclerosis is present, with no evidence of aortic valve stenosis.   5. The inferior vena cava is normal in size with greater than 50%  respiratory variability, suggesting right atrial pressure of 3 mmHg.   Comparison(s): Prior images reviewed side by side. The left ventricular  function has improved.   EKG:  EKG not ordered today.   Recent Labs: 09/23/2022: ALT 16 09/27/2022: B Natriuretic Peptide 3,505.9 09/30/2022: TSH 1.458 10/04/2022: BUN 82; Creatinine, Ser 7.49; Magnesium 1.9; Potassium 4.4; Sodium 133 10/06/2022: Hemoglobin 9.6; Platelets  216  Recent Lipid Panel No results found for: "CHOL", "TRIG", "HDL", "CHOLHDL", "VLDL", "LDLCALC", "LDLDIRECT"  Physical Exam:    Vital Signs: There were no vitals taken for this visit.    Wt Readings from Last 3 Encounters:  10/06/22 149 lb 14.6 oz (68 kg)  07/08/22 160 lb (72.6 kg)  03/23/22 160 lb (72.6 kg)     General: 67 y.o. male in no acute distress. HEENT: Normocephalic and atraumatic. Sclera clear. EOMs intact. Neck: Supple. No carotid bruits. No JVD. Heart: *** RRR. Distinct S1 and S2. No murmurs, gallops, or rubs. Radial and distal pedal pulses 2+ and equal bilaterally. Lungs: No increased work of breathing. Clear to ausculation bilaterally. No wheezes, rhonchi, or rales.  Abdomen: Soft, non-distended, and non-tender to palpation. Bowel sounds present in all 4 quadrants.  MSK: Normal strength and tone for age. *** Extremities: No lower extremity edema.    Skin: Warm and dry. Neuro: Alert and oriented x3. No focal deficits. Psych: Normal affect. Responds appropriately.   Assessment:    No diagnosis found.  Plan:     Disposition: Follow up in ***   Medication Adjustments/Labs and Tests Ordered: Current medicines are reviewed at length with the patient today.  Concerns regarding medicines are outlined above.  No orders of the defined types were placed in this encounter.  No orders of the defined types were placed in this encounter.   There are no Patient Instructions on file for this visit.   Signed, Corrin Parker, PA-C  12/17/2022 2:45 PM    [facility]

## 2022-12-19 ENCOUNTER — Ambulatory Visit (INDEPENDENT_AMBULATORY_CARE_PROVIDER_SITE_OTHER): Payer: Medicare Other | Admitting: Physician Assistant

## 2022-12-19 VITALS — BP 167/82 | HR 85 | Temp 97.5°F | Resp 18 | Ht 63.0 in | Wt 145.5 lb

## 2022-12-19 DIAGNOSIS — N186 End stage renal disease: Secondary | ICD-10-CM

## 2022-12-19 DIAGNOSIS — Z992 Dependence on renal dialysis: Secondary | ICD-10-CM | POA: Diagnosis not present

## 2022-12-19 NOTE — Progress Notes (Signed)
Office Note     CC:  follow up Requesting Provider:  No ref. provider found  HPI: [name] is a 67 y.o. ([birth year]) male who presents for postoperative evaluation of left arm brachiocephalic fistula.  He underwent revision of fistula with plication of aneurysm by Dr. Edilia Bo on 07/11/2022.  At the time he required [facility] placement.  Since surgery TDC has been removed.  He is dialyzing via left arm brachiocephalic fistula without any trouble.  He dialyzes on a Tuesday Thursday Saturday schedule.  Surgical history also significant for left below the knee amputation by Orthopedics.   Past Medical History:  Diagnosis Date   CHF (congestive heart failure) (HCC)    Chronic kidney disease    Diabetes mellitus without complication (HCC)    PATIENT JUST LEARNED HE WAS DIABETIC   Hepatitis    Hyperlipemia    Hypertension    Necrotizing fasciitis (HCC)    Pneumonia    HX OF PNA    Past Surgical History:  Procedure Laterality Date   AMPUTATION Left 09/11/2013   Procedure: AMPUTATION BELOW KNEE;  Surgeon: Cheral Almas, MD;  Location: [facility] OR;  Service: Orthopedics;  Laterality: Left;   AMPUTATION Right 12/18/2013   Procedure: RIGHT FOOT 1,2, TOE AMPUTATION  5th toe RAY AMPUTATION;  Surgeon: Cheral Almas, MD;  Location: [facility] OR;  Service: Orthopedics;  Laterality: Right;   AV FISTULA PLACEMENT Left 12/11/2019   Procedure: LEFT BRACHIOCEPHALIC FISTULA CREATION;  Surgeon: Larina Earthly, MD;  Location: [facility] OR;  Service: Vascular;  Laterality: Left;   CHOLECYSTECTOMY     I & D EXTREMITY Left 09/03/2013   Procedure: IRRIGATION AND DEBRIDEMENT EXTREMITY;  Surgeon: Cheral Almas, MD;  Location: [facility] OR;  Service: Orthopedics;  Laterality: Left;   I & D EXTREMITY Left 09/11/2013   Procedure: LEFT FOOT IRRIGATION AND DEBRIDEMENT;  Surgeon: Cheral Almas, MD;  Location: [facility] OR;  Service: Orthopedics;  Laterality: Left;   INSERTION OF DIALYSIS CATHETER  12/11/2019   Procedure: Insertion Of  Dialysis Catheter;  Surgeon: Larina Earthly, MD;  Location: [facility] OR;  Service: Vascular;;   INSERTION OF DIALYSIS CATHETER Right 07/11/2022   Procedure: INSERTION OF TUNNELED DIALYSIS CATHETER;  Surgeon: Chuck Hint, MD;  Location: [facility] OR;  Service: Vascular;  Laterality: Right;   LEFT HEART CATH AND CORONARY ANGIOGRAPHY N/A 12/13/2019   Procedure: LEFT HEART CATH AND CORONARY ANGIOGRAPHY;  Surgeon: Corky Crafts, MD;  Location: [facility] INVASIVE CV LAB;  Service: Cardiovascular;  Laterality: N/A;   REVISON OF ARTERIOVENOUS FISTULA Left 07/11/2022   Procedure: REVISON OF LEFT ARM ARTERIOVENOUS FISTULA;  Surgeon: Chuck Hint, MD;  Location: [facility] OR;  Service: Vascular;  Laterality: Left;   RIGHT HEART CATH N/A 12/13/2019   Procedure: RIGHT HEART CATH;  Surgeon: Corky Crafts, MD;  Location: [facility] INVASIVE CV LAB;  Service: Cardiovascular;  Laterality: N/A;    Social History   Socioeconomic History   Marital status: Single    Spouse name: Not on file   Number of children: Not on file   Years of education: Not on file   Highest education level: Not on file  Occupational History   Not on file  Tobacco Use   Smoking status: Never   Smokeless tobacco: Never  Vaping Use   Vaping Use: Never used  Substance and Sexual Activity   Alcohol use: No   Drug use: No   Sexual activity: Not on file  Other Topics Concern  Not on file  Social History Narrative   ** Merged History Encounter **       Social Determinants of Health   Financial Resource Strain: Not on file  Food Insecurity: No Food Insecurity (09/23/2022)   Hunger Vital Sign    Worried About Running Out of Food in the Last Year: Never true    Ran Out of Food in the Last Year: Never true  Transportation Needs: No Transportation Needs (09/23/2022)   PRAPARE - Administrator, Civil Service (Medical): No    Lack of Transportation (Non-Medical): No  Physical Activity: Not on file  Stress: Not on file   Social Connections: Not on file  Intimate Partner Violence: Not At Risk (09/23/2022)   Humiliation, Afraid, Rape, and Kick questionnaire    Fear of Current or Ex-Partner: No    Emotionally Abused: No    Physically Abused: No    Sexually Abused: No    Family History  Problem Relation Age of Onset   Diabetes type II Mother    Dementia Mother    Heart disease Father     Current Outpatient Medications  Medication Sig Dispense Refill   amiodarone (PACERONE) 200 MG tablet Take 1 tablet (200 mg total) by mouth daily. 30 tablet 0   apixaban (ELIQUIS) 5 MG TABS tablet Take 1 tablet (5 mg total) by mouth 2 (two) times daily. 60 tablet 0   atorvastatin (LIPITOR) 20 MG tablet Take 1 tablet (20 mg total) by mouth daily.     calcitRIOL (ROCALTROL) 0.5 MCG capsule Take 1 capsule (0.5 mcg total) by mouth Every Tuesday,Thursday,and Saturday with dialysis. 30 capsule 0   sevelamer carbonate (RENVELA) 800 MG tablet Take 1 tablet (800 mg total) by mouth 3 (three) times daily with meals. 30 tablet 0   No current facility-administered medications for this visit.    Allergies  Allergen Reactions   Tramadol Nausea And Vomiting    Pt states he took this on an empty stomach one time and it came right back up. He's not had it since.      REVIEW OF SYSTEMS:   [X]  denotes positive finding, [ ]  denotes negative finding Cardiac  Comments:  Chest pain or chest pressure:    Shortness of breath upon exertion:    Short of breath when lying flat:    Irregular heart rhythm:        Vascular    Pain in calf, thigh, or hip brought on by ambulation:    Pain in feet at night that wakes you up from your sleep:     Blood clot in your veins:    Leg swelling:         Pulmonary    Oxygen at home:    Productive cough:     Wheezing:         Neurologic    Sudden weakness in arms or legs:     Sudden numbness in arms or legs:     Sudden onset of difficulty speaking or slurred speech:    Temporary loss of  vision in one eye:     Problems with dizziness:         Gastrointestinal    Blood in stool:     Vomited blood:         Genitourinary    Burning when urinating:     Blood in urine:        Psychiatric    Major depression:  Hematologic    Bleeding problems:    Problems with blood clotting too easily:        Skin    Rashes or ulcers:        Constitutional    Fever or chills:      PHYSICAL EXAMINATION:  Vitals:   12/19/22 1014  BP: (!) 167/82  Pulse: 85  Resp: 18  Temp: (!) 97.5 F (36.4 C)  TempSrc: Temporal  SpO2: 98%  Weight: 145 lb 8.1 oz (66 kg)  Height: 5\' 3"  (1.6 m)    General:  WDWN in NAD; vital signs documented above Gait: Not observed HENT: WNL, normocephalic Pulmonary: normal non-labored breathing , without Rales, rhonchi,  wheezing Cardiac: regular HR Abdomen: soft, NT, no masses Skin: without rashes Vascular Exam/Pulses: palpable 1+ L radial pulse Extremities: Palpable thrill throughout left brachiocephalic fistula without any areas of thinning skin or ulcerations Musculoskeletal: no muscle wasting or atrophy  Neurologic: A&O X 3 Psychiatric:  The pt has Normal affect.    ASSESSMENT/PLAN:: 67 y.o. male here for follow up for postoperative evaluation of left brachiocephalic fistula  -[name] is a 67 year old male who underwent revision of left brachiocephalic fistula with plication of aneurysm by Dr. Edilia Bo in December of last year.  He has since had his TDC removed and is dialyzing without complication from left arm fistula.  There are not any areas of thinning skin.  He does have an aneurysmal area however is small.  No indication for revision at this time.  He will follow-up on an as-needed basis.   Emilie Rutter, PA-C [facility] [phone]  Clinic MD:   Myra Gianotti

## 2022-12-30 ENCOUNTER — Ambulatory Visit: Payer: Medicare Other | Admitting: Student

## 2023-01-13 ENCOUNTER — Encounter: Payer: Self-pay | Admitting: Student

## 2023-01-13 ENCOUNTER — Ambulatory Visit: Payer: Medicare Other | Attending: Student | Admitting: Student

## 2023-01-13 VITALS — BP 134/76 | HR 84 | Ht 63.0 in | Wt 145.0 lb

## 2023-01-13 DIAGNOSIS — I10 Essential (primary) hypertension: Secondary | ICD-10-CM | POA: Diagnosis present

## 2023-01-13 DIAGNOSIS — I480 Paroxysmal atrial fibrillation: Secondary | ICD-10-CM | POA: Diagnosis present

## 2023-01-13 DIAGNOSIS — I5042 Chronic combined systolic (congestive) and diastolic (congestive) heart failure: Secondary | ICD-10-CM | POA: Diagnosis present

## 2023-01-13 DIAGNOSIS — E785 Hyperlipidemia, unspecified: Secondary | ICD-10-CM

## 2023-01-13 DIAGNOSIS — I2510 Atherosclerotic heart disease of native coronary artery without angina pectoris: Secondary | ICD-10-CM | POA: Diagnosis present

## 2023-01-13 NOTE — Progress Notes (Signed)
Cardiology Clinic Note   Date: 01/13/2023 ID: [name], DOB [birth year], MRN [id number]  Primary Cardiologist:  Parke Poisson, MD  Patient Profile    [name] is a 67 y.o. male who presents to the clinic today for routine follow up.     Past medical history significant for: CAD. LHC 12/13/2019: RPDA 50%.  Mid to distal LM 50%.  Proximal to mid Cx 100%.  Mid LAD 75%.  Distal LAD #1 50%, #2 90%.  No aortic valve stenosis.  Hemodynamic findings consistent with moderate pulmonary hypertension.  Moderate CAD.  He denied angina. Chronic combined diastolic and systolic heart failure. Echo 11/18/2022: EF 50 to 55%.  Grade I DD.  Normal RV function.  Trivial MR.  Aortic valve sclerosis without stenosis.  LV function improved from prior echo February 2024. PAF. Onset February 2024. PAD. 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 Arterial ultrasound/ABI 11/16/2022: Calcific atherosclerosis throughout the right lower extremity arteries.  Segmental occlusion of the posterior tibial artery.  Unable to rule out additional tibial vessel disease due to heavily calcified vessels with areas of shadowing.  Noncompressible right lower extremity arteries. Hypertension. Hyperlipidemia. T2DM. Hyperparathyroidism. DVT. Venous ultrasound lower extremity 09/23/2022: Findings consistent with age-indeterminate DVT involving the right popliteal vein. ESRD. Hemodialysis [facility]     History of Present Illness    [name] was first evaluated by cardiology during hospital admission in May 2021.  He presented to the hospital with purulent cellulitis of the right lower extremity.  He was also grossly volume overloaded.  Echo showed EF 30 to 35%, Grade II DD.  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  He was anemic with hemoglobin of 7.1 and underwent blood transfusion.  LHC showed moderate multivessel CAD therapy was recommended.  Repeat echo September 2021 showed EF 50 to  55%, Grade II DD with evidence of high left filling pressures improved from 2021.  Patient was lost to follow-up until hospital admission 09/22/2022 to 10/06/2022 for sepsis secondary to pneumonia.  CTA was negative for PE but showed findings consistent with multifocal pneumonia associated with bilateral pleural effusions.  Troponin 5494>> 6320.  Echo showed EF 30 to 35% with severe hypokinesis of the mid to apical septum, mid to apical anterior and mid to apical inferior LV segments and apex concerning for possible LAD disease versus Takotsubo cardiomyopathy.  Cardiology felt Takotsubo was more likely.  He was felt to be a poor catheterization candidate.  He was started on metoprolol with volume status managed by hemodialysis.  GDMT limited secondary to hypotension and ESRD.  Hospitalization was also complicated by new onset A-fib with RVR converted back to sinus rhythm on IV amiodarone.  Further complication of ileus requiring NG tube tube placement.  Patient was discharged on 1 L of O2, amiodarone, Eliquis.  Amlodipine, carvedilol, hydralazine, Imdur stopped at discharge due to hypotension.  Patient was last seen in the office by Marjie Skiff, PA-C on 10/19/2022.  He was doing well from a cardiac standpoint with complaints of weakness.  Repeat echo showed recovered LV function.  Today, patient is accompanied by his caregiver. He denies shortness of breath or dyspnea on exertion. No chest pain, pressure, or tightness. Denies lower extremity edema, orthopnea, or PND. No palpitations. He reports he is done working with PT. He feels stronger than he did at last visit. He reports BP tends to drop in the middle of his dialysis treatment but recovers by the end  of treatment.    ROS: All other systems reviewed and are otherwise negative except as noted in History of Present Illness.  Studies Reviewed    ECG is not ordered today.   Risk Assessment/Calculations     CHA2DS2-VASc Score = 4   This indicates a  4.8% annual risk of stroke. The patient's score is based upon: CHF History: 1 HTN History: 1 Diabetes History: 1 Stroke History: 0 Vascular Disease History: 1 Age Score: 0 Gender Score: 0             Physical Exam    VS:  BP 134/76 (BP Location: Right Arm, Patient Position: Sitting, Cuff Size: Normal)   Pulse 84   Ht 5\' 3"  (1.6 m)   Wt 145 lb (65.8 kg)   SpO2 98%   BMI 25.69 kg/m  , BMI Body mass index is 25.69 kg/m.  GEN: Well nourished, well developed, in no acute distress. Neck: No JVD or carotid bruits. Cardiac:  RRR. No murmurs. No rubs or gallops.   Respiratory:  Respirations regular and unlabored. Clear to auscultation without rales, wheezing or rhonchi. GI: Soft, nontender, nondistended. Extremities: Radials/DP/PT 2+ and equal bilaterally. No clubbing or cyanosis. No edema.  Skin: Warm and dry, no rash. Neuro: Strength intact.  Assessment & Plan   CAD.  LHC May 2021 showed moderate multivessel CAD.  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  Patient was not a good candidate for catheterization.  Patient denies chest pain, pressure, tightness.  Continue atorvastatin.  Patient not on aspirin secondary to Eliquis.  GDMT limited secondary to hypotension. Chronic combined diastolic and systolic heart failure.  Echo April 2024 showed normal LV/RV function with Grade I DD.  Patient denies lower extremity edema, orthopnea or PND. Euvolemic and well compensated on exam.  Volume status managed by hemodialysis. GDMT limited by hypotension.  PAF.  Onset during hospitalization February 2024.  Patient denies palpitations. Denies spontaneous bleeding concerns. RRR on exam today.  Continue amiodarone and Eliquis. Appropriate Eliquis dose.   Hypertension.  Antihypertensive medicines.  Since February hospitalization. BP today 134/76.  Patient denies headaches, dizziness or vision changes. Patient reports drop in BP in the middle of  dialysis treatment that recovers. BP at facility this morning 100/62.  Hyperlipidemia. No recent lipid panel. One was ordered at last visit but patient did not have it drawn at facility. He is not fasting today. Will get a lipid panel with direct LDL today.   Disposition: Lipid panel, direct LDL today.  Return in 3 months or sooner as needed.          Signed, Etta Grandchild. Theda Payer, DNP, NP-C

## 2023-01-13 NOTE — Patient Instructions (Signed)
Medication Instructions:  NO CHANGES  *If you need a refill on your cardiac medications before your next appointment, please call your pharmacy*   Lab Work: Lipid Panel, Direct LDL   If you have labs (blood work) drawn today and your tests are completely normal, you will receive your results only by: MyChart Message (if you have MyChart) OR A paper copy in the mail If you have any lab test that is abnormal or we need to change your treatment, we will call you to review the results.   Follow-Up: At [facility], you and your health needs are our priority.  As part of our continuing mission to provide you with exceptional heart care, we have created designated Provider Care Teams.  These Care Teams include your primary Cardiologist (physician) and Advanced Practice Providers (APPs -  Physician Assistants and Nurse Practitioners) who all work together to provide you with the care you need, when you need it.  We recommend signing up for the patient portal called "MyChart".  Sign up information is provided on this After Visit Summary.  MyChart is used to connect with patients for Virtual Visits (Telemedicine).  Patients are able to view lab/test results, encounter notes, upcoming appointments, etc.  Non-urgent messages can be sent to your provider as well.   To learn more about what you can do with MyChart, go to [email or web address].    Your next appointment:     3-4 months with Dr. Jacques Navy

## 2023-01-14 LAB — LIPID PANEL
Chol/HDL Ratio: 2.8 ratio (ref 0.0–5.0)
Cholesterol, Total: 134 mg/dL (ref 100–199)
HDL: 48 mg/dL (ref >39)
LDL Chol Calc (NIH): 66 mg/dL (ref 0–99)
Triglycerides: 110 mg/dL (ref 0–149)
VLDL Cholesterol Cal: 20 mg/dL (ref 5–40)

## 2023-01-14 LAB — LDL CHOLESTEROL, DIRECT: LDL Direct: 62 mg/dL (ref 0–99)

## 2023-02-16 NOTE — Progress Notes (Signed)
Office Note     CC:  Fistula malfunction Requesting Provider:  Estanislado Emms, MD  HPI: [name] is a 67 y.o. ([birth year]) male who presents at the request of Dr. Malen Gauze for thinning of skin overlying the fistula.  Left BC fistula created in 2021 by Dr. Donna Bernard was last revised in November of 2023 by my partner Dr. Edilia Bo.   On exam today Shrey was doing well. No complaints. No issues at dialysis. Normal runs, no de-cannulation bleeding.  No sxs of steal.   Past Medical History:  Diagnosis Date   CHF (congestive heart failure) (HCC)    Chronic kidney disease    Diabetes mellitus without complication (HCC)    PATIENT JUST LEARNED HE WAS DIABETIC   Hepatitis    Hyperlipemia    Hypertension    Necrotizing fasciitis (HCC)    Pneumonia    HX OF PNA    Past Surgical History:  Procedure Laterality Date   AMPUTATION Left 09/11/2013   Procedure: AMPUTATION BELOW KNEE;  Surgeon: Cheral Almas, MD;  Location: [facility] OR;  Service: Orthopedics;  Laterality: Left;   AMPUTATION Right 12/18/2013   Procedure: RIGHT FOOT 1,2, TOE AMPUTATION  5th toe RAY AMPUTATION;  Surgeon: Cheral Almas, MD;  Location: [facility] OR;  Service: Orthopedics;  Laterality: Right;   AV FISTULA PLACEMENT Left 12/11/2019   Procedure: LEFT BRACHIOCEPHALIC FISTULA CREATION;  Surgeon: Larina Earthly, MD;  Location: [facility] OR;  Service: Vascular;  Laterality: Left;   CHOLECYSTECTOMY     I & D EXTREMITY Left 09/03/2013   Procedure: IRRIGATION AND DEBRIDEMENT EXTREMITY;  Surgeon: Cheral Almas, MD;  Location: [facility] OR;  Service: Orthopedics;  Laterality: Left;   I & D EXTREMITY Left 09/11/2013   Procedure: LEFT FOOT IRRIGATION AND DEBRIDEMENT;  Surgeon: Cheral Almas, MD;  Location: [facility] OR;  Service: Orthopedics;  Laterality: Left;   INSERTION OF DIALYSIS CATHETER  12/11/2019   Procedure: Insertion Of Dialysis Catheter;  Surgeon: Larina Earthly, MD;  Location: [facility] OR;  Service: Vascular;;   INSERTION OF DIALYSIS  CATHETER Right 07/11/2022   Procedure: INSERTION OF TUNNELED DIALYSIS CATHETER;  Surgeon: Chuck Hint, MD;  Location: [facility] OR;  Service: Vascular;  Laterality: Right;   LEFT HEART CATH AND CORONARY ANGIOGRAPHY N/A 12/13/2019   Procedure: LEFT HEART CATH AND CORONARY ANGIOGRAPHY;  Surgeon: Corky Crafts, MD;  Location: [facility] INVASIVE CV LAB;  Service: Cardiovascular;  Laterality: N/A;   REVISON OF ARTERIOVENOUS FISTULA Left 07/11/2022   Procedure: REVISON OF LEFT ARM ARTERIOVENOUS FISTULA;  Surgeon: Chuck Hint, MD;  Location: [facility] OR;  Service: Vascular;  Laterality: Left;   RIGHT HEART CATH N/A 12/13/2019   Procedure: RIGHT HEART CATH;  Surgeon: Corky Crafts, MD;  Location: [facility] INVASIVE CV LAB;  Service: Cardiovascular;  Laterality: N/A;    Social History   Socioeconomic History   Marital status: Single    Spouse name: Not on file   Number of children: Not on file   Years of education: Not on file   Highest education level: Not on file  Occupational History   Not on file  Tobacco Use   Smoking status: Never   Smokeless tobacco: Never  Vaping Use   Vaping status: Never Used  Substance and Sexual Activity   Alcohol use: No   Drug use: No   Sexual activity: Not on file  Other Topics Concern   Not on file  Social History Narrative   **  Merged History Encounter **       Social Determinants of Health   Financial Resource Strain: Not on file  Food Insecurity: No Food Insecurity (09/23/2022)   Hunger Vital Sign    Worried About Running Out of Food in the Last Year: Never true    Ran Out of Food in the Last Year: Never true  Transportation Needs: No Transportation Needs (09/23/2022)   PRAPARE - Administrator, Civil Service (Medical): No    Lack of Transportation (Non-Medical): No  Physical Activity: Not on file  Stress: Not on file  Social Connections: Not on file  Intimate Partner Violence: Not At Risk (09/23/2022)   Humiliation, Afraid,  Rape, and Kick questionnaire    Fear of Current or Ex-Partner: No    Emotionally Abused: No    Physically Abused: No    Sexually Abused: No   Family History  Problem Relation Age of Onset   Diabetes type II Mother    Dementia Mother    Heart disease Father     Current Outpatient Medications  Medication Sig Dispense Refill   amiodarone (PACERONE) 200 MG tablet Take 1 tablet (200 mg total) by mouth daily. 30 tablet 0   apixaban (ELIQUIS) 5 MG TABS tablet Take 1 tablet (5 mg total) by mouth 2 (two) times daily. 60 tablet 0   atorvastatin (LIPITOR) 20 MG tablet Take 1 tablet (20 mg total) by mouth daily.     calamine lotion Apply 1 Application topically 2 (two) times daily.     calcitRIOL (ROCALTROL) 0.5 MCG capsule Take 1 capsule (0.5 mcg total) by mouth Every Tuesday,Thursday,and Saturday with dialysis. (Patient not taking: Reported on 01/13/2023) 30 capsule 0   ferric citrate (AURYXIA) 1 GM 210 MG(Fe) tablet Take 210 mg by mouth 2 (two) times daily with a meal.     metoprolol succinate (TOPROL-XL) 25 MG 24 hr tablet Take 12.5 mg by mouth 3 (three) times a week.     PROMETHAZINE HCL IM Inject into the muscle.     sevelamer carbonate (RENVELA) 800 MG tablet Take 1 tablet (800 mg total) by mouth 3 (three) times daily with meals. (Patient not taking: Reported on 01/13/2023) 30 tablet 0   tamsulosin (FLOMAX) 0.4 MG CAPS capsule Take 0.4 mg by mouth 3 (three) times a week.     No current facility-administered medications for this visit.    Allergies  Allergen Reactions   Tramadol Nausea And Vomiting    Pt states he took this on an empty stomach one time and it came right back up. He's not had it since.      REVIEW OF SYSTEMS:  [X]  denotes positive finding, [ ]  denotes negative finding Cardiac  Comments:  Chest pain or chest pressure:    Shortness of breath upon exertion:    Short of breath when lying flat:    Irregular heart rhythm:        Vascular    Pain in calf, thigh, or hip  brought on by ambulation:    Pain in feet at night that wakes you up from your sleep:     Blood clot in your veins:    Leg swelling:         Pulmonary    Oxygen at home:    Productive cough:     Wheezing:         Neurologic    Sudden weakness in arms or legs:     Sudden numbness in arms  or legs:     Sudden onset of difficulty speaking or slurred speech:    Temporary loss of vision in one eye:     Problems with dizziness:         Gastrointestinal    Blood in stool:     Vomited blood:         Genitourinary    Burning when urinating:     Blood in urine:        Psychiatric    Major depression:         Hematologic    Bleeding problems:    Problems with blood clotting too easily:        Skin    Rashes or ulcers:        Constitutional    Fever or chills:      PHYSICAL EXAMINATION:  There were no vitals filed for this visit.  General:  WDWN in NAD; vital signs documented above Gait: Not observed HENT: WNL, normocephalic Pulmonary: normal non-labored breathing , without Rales, rhonchi,  wheezing Cardiac: regular HR,  Abdomen: soft, NT, no masses Skin: without rashes Vascular Exam/Pulses:  Right Left  Radial 2+ (normal) 1+ (weak)                       Extremities: without ischemic changes, without Gangrene , without cellulitis; without open wounds;  Fistula with thrill   Musculoskeletal: no muscle wasting or atrophy, amp L leg AKA Neurologic: A&O X 3;  No focal weakness or paresthesias are detected Psychiatric:  The pt has Normal affect.    ASSESSMENT/PLAN:: 67 y.o. male presenting with concern for skin over the left AVF. This was previously revised last yer with Dr. Edilia Bo. On exam, there appears to be blanching with some scarring, but there is healthy dermis.  No need for revision at this time.   I called and spoke to Dr. Malen Gauze regarding the above.    Victorino Sparrow, MD [facility] [phone] Total time of patient care  including pre-visit research, consultation, and documentation greater than 30 minutes

## 2023-02-17 ENCOUNTER — Ambulatory Visit (INDEPENDENT_AMBULATORY_CARE_PROVIDER_SITE_OTHER): Payer: Medicare Other | Admitting: Vascular Surgery

## 2023-02-17 ENCOUNTER — Encounter: Payer: Self-pay | Admitting: Vascular Surgery

## 2023-02-17 VITALS — BP 124/65 | HR 81 | Temp 97.4°F | Resp 16 | Ht 62.0 in | Wt 135.0 lb

## 2023-02-17 DIAGNOSIS — T82590A Other mechanical complication of surgically created arteriovenous fistula, initial encounter: Secondary | ICD-10-CM

## 2023-05-16 ENCOUNTER — Ambulatory Visit: Payer: Medicare Other | Attending: Internal Medicine | Admitting: Internal Medicine

## 2023-05-17 ENCOUNTER — Encounter: Payer: Self-pay | Admitting: Internal Medicine

## 2023-07-17 ENCOUNTER — Inpatient Hospital Stay (HOSPITAL_COMMUNITY)
Admission: EM | Admit: 2023-07-17 | Discharge: 2023-07-21 | DRG: 377 | Disposition: A | Discharge status: Skilled Nursing Facility | Payer: Medicare Other | Source: Skilled Nursing Facility | Attending: Internal Medicine | Admitting: Internal Medicine

## 2023-07-17 DIAGNOSIS — L89322 Pressure ulcer of left buttock, stage 2: Secondary | ICD-10-CM | POA: Diagnosis present

## 2023-07-17 DIAGNOSIS — D649 Anemia, unspecified: Principal | ICD-10-CM

## 2023-07-17 DIAGNOSIS — I480 Paroxysmal atrial fibrillation: Secondary | ICD-10-CM | POA: Diagnosis present

## 2023-07-17 DIAGNOSIS — Z8701 Personal history of pneumonia (recurrent): Secondary | ICD-10-CM

## 2023-07-17 DIAGNOSIS — K922 Gastrointestinal hemorrhage, unspecified: Secondary | ICD-10-CM | POA: Diagnosis present

## 2023-07-17 DIAGNOSIS — R195 Other fecal abnormalities: Secondary | ICD-10-CM

## 2023-07-17 DIAGNOSIS — E1149 Type 2 diabetes mellitus with other diabetic neurological complication: Secondary | ICD-10-CM | POA: Diagnosis present

## 2023-07-17 DIAGNOSIS — Z885 Allergy status to narcotic agent status: Secondary | ICD-10-CM

## 2023-07-17 DIAGNOSIS — Z79899 Other long term (current) drug therapy: Secondary | ICD-10-CM

## 2023-07-17 DIAGNOSIS — N186 End stage renal disease: Secondary | ICD-10-CM | POA: Diagnosis not present

## 2023-07-17 DIAGNOSIS — Z833 Family history of diabetes mellitus: Secondary | ICD-10-CM

## 2023-07-17 DIAGNOSIS — D631 Anemia in chronic kidney disease: Secondary | ICD-10-CM | POA: Diagnosis present

## 2023-07-17 DIAGNOSIS — Z89512 Acquired absence of left leg below knee: Secondary | ICD-10-CM

## 2023-07-17 DIAGNOSIS — L89612 Pressure ulcer of right heel, stage 2: Secondary | ICD-10-CM

## 2023-07-17 DIAGNOSIS — K31811 Angiodysplasia of stomach and duodenum with bleeding: Secondary | ICD-10-CM | POA: Diagnosis not present

## 2023-07-17 DIAGNOSIS — I4891 Unspecified atrial fibrillation: Secondary | ICD-10-CM | POA: Diagnosis not present

## 2023-07-17 DIAGNOSIS — L89152 Pressure ulcer of sacral region, stage 2: Secondary | ICD-10-CM | POA: Diagnosis present

## 2023-07-17 DIAGNOSIS — Z8249 Family history of ischemic heart disease and other diseases of the circulatory system: Secondary | ICD-10-CM

## 2023-07-17 DIAGNOSIS — T45515A Adverse effect of anticoagulants, initial encounter: Secondary | ICD-10-CM | POA: Diagnosis present

## 2023-07-17 DIAGNOSIS — Z992 Dependence on renal dialysis: Secondary | ICD-10-CM

## 2023-07-17 DIAGNOSIS — K921 Melena: Secondary | ICD-10-CM

## 2023-07-17 DIAGNOSIS — E875 Hyperkalemia: Secondary | ICD-10-CM | POA: Diagnosis present

## 2023-07-17 DIAGNOSIS — E1122 Type 2 diabetes mellitus with diabetic chronic kidney disease: Secondary | ICD-10-CM | POA: Diagnosis present

## 2023-07-17 DIAGNOSIS — D696 Thrombocytopenia, unspecified: Secondary | ICD-10-CM | POA: Diagnosis present

## 2023-07-17 DIAGNOSIS — D62 Acute posthemorrhagic anemia: Secondary | ICD-10-CM | POA: Diagnosis present

## 2023-07-17 DIAGNOSIS — I132 Hypertensive heart and chronic kidney disease with heart failure and with stage 5 chronic kidney disease, or end stage renal disease: Secondary | ICD-10-CM | POA: Diagnosis present

## 2023-07-17 DIAGNOSIS — I739 Peripheral vascular disease, unspecified: Secondary | ICD-10-CM | POA: Diagnosis present

## 2023-07-17 DIAGNOSIS — D6832 Hemorrhagic disorder due to extrinsic circulating anticoagulants: Secondary | ICD-10-CM | POA: Diagnosis present

## 2023-07-17 DIAGNOSIS — I252 Old myocardial infarction: Secondary | ICD-10-CM

## 2023-07-17 DIAGNOSIS — E785 Hyperlipidemia, unspecified: Secondary | ICD-10-CM | POA: Diagnosis present

## 2023-07-17 DIAGNOSIS — I5032 Chronic diastolic (congestive) heart failure: Secondary | ICD-10-CM | POA: Diagnosis present

## 2023-07-17 DIAGNOSIS — K2901 Acute gastritis with bleeding: Secondary | ICD-10-CM

## 2023-07-17 DIAGNOSIS — M898X9 Other specified disorders of bone, unspecified site: Secondary | ICD-10-CM | POA: Diagnosis present

## 2023-07-17 DIAGNOSIS — N2581 Secondary hyperparathyroidism of renal origin: Secondary | ICD-10-CM | POA: Diagnosis present

## 2023-07-17 DIAGNOSIS — Z9049 Acquired absence of other specified parts of digestive tract: Secondary | ICD-10-CM

## 2023-07-17 DIAGNOSIS — Z7901 Long term (current) use of anticoagulants: Secondary | ICD-10-CM

## 2023-07-17 DIAGNOSIS — E1140 Type 2 diabetes mellitus with diabetic neuropathy, unspecified: Secondary | ICD-10-CM

## 2023-07-17 LAB — COMPREHENSIVE METABOLIC PANEL
ALT: 44 U/L (ref 0–44)
AST: 33 U/L (ref 15–41)
Albumin: 2.7 g/dL — ABNORMAL LOW (ref 3.5–5.0)
Alkaline Phosphatase: 145 U/L — ABNORMAL HIGH (ref 38–126)
Anion gap: 11 (ref 5–15)
BUN: 36 mg/dL — ABNORMAL HIGH (ref 8–23)
CO2: 27 mmol/L (ref 22–32)
Calcium: 8.1 mg/dL — ABNORMAL LOW (ref 8.9–10.3)
Chloride: 98 mmol/L (ref 98–111)
Creatinine, Ser: 4.53 mg/dL — ABNORMAL HIGH (ref 0.61–1.24)
GFR, Estimated: 14 mL/min — ABNORMAL LOW (ref >60)
Glucose, Bld: 113 mg/dL — ABNORMAL HIGH (ref 70–99)
Potassium: 3.8 mmol/L (ref 3.5–5.1)
Sodium: 136 mmol/L (ref 135–145)
Total Bilirubin: 0.7 mg/dL (ref <1.2)
Total Protein: 6.3 g/dL — ABNORMAL LOW (ref 6.5–8.1)

## 2023-07-17 LAB — CBC WITH DIFFERENTIAL/PLATELET
Abs Immature Granulocytes: 0.16 10*3/uL — ABNORMAL HIGH (ref 0.00–0.07)
Basophils Absolute: 0 10*3/uL (ref 0.0–0.1)
Basophils Relative: 0 %
Eosinophils Absolute: 0.1 10*3/uL (ref 0.0–0.5)
Eosinophils Relative: 1 %
HCT: 24.3 % — ABNORMAL LOW (ref 39.0–52.0)
Hemoglobin: 7.5 g/dL — ABNORMAL LOW (ref 13.0–17.0)
Immature Granulocytes: 2 %
Lymphocytes Relative: 15 %
Lymphs Abs: 1.3 10*3/uL (ref 0.7–4.0)
MCH: 31.6 pg (ref 26.0–34.0)
MCHC: 30.9 g/dL (ref 30.0–36.0)
MCV: 102.5 fL — ABNORMAL HIGH (ref 80.0–100.0)
Monocytes Absolute: 0.7 10*3/uL (ref 0.1–1.0)
Monocytes Relative: 8 %
Neutro Abs: 6.6 10*3/uL (ref 1.7–7.7)
Neutrophils Relative %: 74 %
Platelets: 160 10*3/uL (ref 150–400)
RBC: 2.37 MIL/uL — ABNORMAL LOW (ref 4.22–5.81)
RDW: 17.4 % — ABNORMAL HIGH (ref 11.5–15.5)
WBC: 8.8 10*3/uL (ref 4.0–10.5)
nRBC: 0 % (ref 0.0–0.2)

## 2023-07-17 LAB — PROTIME-INR
INR: 1.6 — ABNORMAL HIGH (ref 0.8–1.2)
Prothrombin Time: 19.4 s — ABNORMAL HIGH (ref 11.4–15.2)

## 2023-07-17 LAB — TYPE AND SCREEN
ABO/RH(D): O NEG
Antibody Screen: NEGATIVE

## 2023-07-17 LAB — POC OCCULT BLOOD, ED: Fecal Occult Bld: POSITIVE — AB

## 2023-07-17 MED ORDER — PANTOPRAZOLE SODIUM 40 MG IV SOLR
40.0000 mg | Freq: Two times a day (BID) | INTRAVENOUS | Status: DC
  Administered 2023-07-18 – 2023-07-19 (×3): 40 mg via INTRAVENOUS
  Filled 2023-07-17 (×3): qty 10

## 2023-07-17 MED ORDER — METOPROLOL SUCCINATE ER 25 MG PO TB24
12.5000 mg | ORAL_TABLET | ORAL | Status: DC
  Administered 2023-07-19 – 2023-07-21 (×2): 12.5 mg via ORAL
  Filled 2023-07-17 (×2): qty 1

## 2023-07-17 MED ORDER — AMIODARONE HCL 200 MG PO TABS
200.0000 mg | ORAL_TABLET | Freq: Every day | ORAL | Status: DC
  Administered 2023-07-18 – 2023-07-21 (×4): 200 mg via ORAL
  Filled 2023-07-17 (×4): qty 1

## 2023-07-17 MED ORDER — ONDANSETRON HCL 4 MG PO TABS: 4.0000 mg | ORAL_TABLET | Freq: Four times a day (QID) | ORAL | Status: DC | PRN

## 2023-07-17 MED ORDER — PANTOPRAZOLE SODIUM 40 MG IV SOLR
40.0000 mg | Freq: Once | INTRAVENOUS | Status: AC
  Administered 2023-07-18: 40 mg via INTRAVENOUS
  Filled 2023-07-17: qty 10

## 2023-07-17 MED ORDER — ACETAMINOPHEN 650 MG RE SUPP: 650.0000 mg | Freq: Four times a day (QID) | RECTAL | Status: DC | PRN

## 2023-07-17 MED ORDER — BETHANECHOL CHLORIDE 5 MG PO TABS
5.0000 mg | ORAL_TABLET | Freq: Three times a day (TID) | ORAL | Status: DC
  Administered 2023-07-18 – 2023-07-21 (×8): 5 mg via ORAL
  Filled 2023-07-17 (×13): qty 1

## 2023-07-17 MED ORDER — ACETAMINOPHEN 325 MG PO TABS: 650.0000 mg | ORAL_TABLET | Freq: Four times a day (QID) | ORAL | Status: DC | PRN

## 2023-07-17 MED ORDER — FERROUS SULFATE 325 (65 FE) MG PO TABS
325.0000 mg | ORAL_TABLET | Freq: Every day | ORAL | Status: DC
  Administered 2023-07-18 – 2023-07-21 (×4): 325 mg via ORAL
  Filled 2023-07-17 (×4): qty 1

## 2023-07-17 MED ORDER — TAMSULOSIN HCL 0.4 MG PO CAPS
0.4000 mg | ORAL_CAPSULE | ORAL | Status: DC
  Administered 2023-07-21: 0.4 mg via ORAL
  Filled 2023-07-17 (×3): qty 1

## 2023-07-17 MED ORDER — ATORVASTATIN CALCIUM 10 MG PO TABS
20.0000 mg | ORAL_TABLET | Freq: Every day | ORAL | Status: DC
  Administered 2023-07-18 – 2023-07-21 (×4): 20 mg via ORAL
  Filled 2023-07-17 (×4): qty 2

## 2023-07-17 MED ORDER — ONDANSETRON HCL 4 MG/2ML IJ SOLN
4.0000 mg | Freq: Four times a day (QID) | INTRAMUSCULAR | Status: DC | PRN
Start: 2023-07-17 — End: 2023-07-22

## 2023-07-17 NOTE — ED Triage Notes (Signed)
Patient BIB [facility] from [place] for low hemoglobin, found after they performed labs earlier today. Patient reports no complaints at this time and denies black or tarry stools.

## 2023-07-17 NOTE — Assessment & Plan Note (Addendum)
Acute anemia (HGB 7.5 today down from 11.0 last month), presumably in setting of GIB with hemoccult + stool, dark on exam though pt is on PO iron. Clear liquid diet Protonix Type and screen Repeat CBC in AM, transfuse if needed EDP sending message to GI for eval tomorrow. Hold eliquis

## 2023-07-17 NOTE — ED Notes (Signed)
ED TO INPATIENT HANDOFF REPORT  ED Nurse Name and Phone #: [phone]  S Name/Age/Gender [name] 67 y.o. male Room/Bed: 030C/030C  Code Status   Code Status: Full Code  Home/SNF/Other Skilled nursing facility Patient oriented to: self, place, time, and situation Is this baseline? Yes   Triage Complete: Triage complete  Chief Complaint ABLA (acute blood loss anemia) [D62]  Triage Note Patient BIB [facility] from [facility] for low hemoglobin, found after they performed labs earlier today. Patient reports no complaints at this time and denies black or tarry stools.   Allergies Allergies  Allergen Reactions   Tramadol Nausea And Vomiting    Pt states he took this on an empty stomach one time and it came right back up. He's not had it since.     Level of Care/Admitting Diagnosis ED Disposition     ED Disposition  Admit   Condition  --   Comment  Hospital Area: [facility] [[id number]]  Level of Care: Telemetry Medical [104]  May place patient in observation at [facility] or [facility] if equivalent level of care is available:: No  Covid Evaluation: Asymptomatic - no recent exposure (last 10 days) testing not required  Diagnosis: ABLA (acute blood loss anemia) [0347425]  Admitting Physician: [name] [phone]  Attending Physician: [name] [[id number]]          B Medical/Surgery History Past Medical History:  Diagnosis Date   CHF (congestive heart failure) (HCC)    Chronic kidney disease    Diabetes mellitus without complication (HCC)    PATIENT JUST LEARNED HE WAS DIABETIC   Hepatitis    Hyperlipemia    Hypertension    Necrotizing fasciitis (HCC)    Pneumonia    HX OF PNA   Past Surgical History:  Procedure Laterality Date   AMPUTATION Left 09/11/2013   Procedure: AMPUTATION BELOW KNEE;  Surgeon: Cheral Almas, MD;  Location: [facility] OR;  Service: Orthopedics;  Laterality: Left;   AMPUTATION Right 12/18/2013   Procedure: RIGHT  FOOT 1,2, TOE AMPUTATION  5th toe RAY AMPUTATION;  Surgeon: Cheral Almas, MD;  Location: [facility] OR;  Service: Orthopedics;  Laterality: Right;   AV FISTULA PLACEMENT Left 12/11/2019   Procedure: LEFT BRACHIOCEPHALIC FISTULA CREATION;  Surgeon: Larina Earthly, MD;  Location: [facility] OR;  Service: Vascular;  Laterality: Left;   CHOLECYSTECTOMY     I & D EXTREMITY Left 09/03/2013   Procedure: IRRIGATION AND DEBRIDEMENT EXTREMITY;  Surgeon: Cheral Almas, MD;  Location: [facility] OR;  Service: Orthopedics;  Laterality: Left;   I & D EXTREMITY Left 09/11/2013   Procedure: LEFT FOOT IRRIGATION AND DEBRIDEMENT;  Surgeon: Cheral Almas, MD;  Location: [facility] OR;  Service: Orthopedics;  Laterality: Left;   INSERTION OF DIALYSIS CATHETER  12/11/2019   Procedure: Insertion Of Dialysis Catheter;  Surgeon: Larina Earthly, MD;  Location: [facility] OR;  Service: Vascular;;   INSERTION OF DIALYSIS CATHETER Right 07/11/2022   Procedure: INSERTION OF TUNNELED DIALYSIS CATHETER;  Surgeon: Chuck Hint, MD;  Location: [facility] OR;  Service: Vascular;  Laterality: Right;   LEFT HEART CATH AND CORONARY ANGIOGRAPHY N/A 12/13/2019   Procedure: LEFT HEART CATH AND CORONARY ANGIOGRAPHY;  Surgeon: Corky Crafts, MD;  Location: [facility] INVASIVE CV LAB;  Service: Cardiovascular;  Laterality: N/A;   REVISON OF ARTERIOVENOUS FISTULA Left 07/11/2022   Procedure: REVISON OF LEFT ARM ARTERIOVENOUS FISTULA;  Surgeon: Chuck Hint, MD;  Location: [facility] OR;  Service:  Vascular;  Laterality: Left;   RIGHT HEART CATH N/A 12/13/2019   Procedure: RIGHT HEART CATH;  Surgeon: Corky Crafts, MD;  Location: [facility] INVASIVE CV LAB;  Service: Cardiovascular;  Laterality: N/A;     A IV Location/Drains/Wounds Patient Lines/Drains/Airways Status     Active Line/Drains/Airways     Name Placement date Placement time Site Days   Peripheral IV 07/17/23 20 G Anterior;Right Hand 07/17/23  1837  Hand  less than 1   Peripheral IV 07/17/23 18 G  Anterior;Distal;Right;Upper Antecubital 07/17/23  1837  Antecubital  less than 1   Fistula / Graft Left Upper arm Arteriovenous fistula 12/11/19  1414  Upper arm  1314   Pressure Injury 11/29/19 Sacrum Right Stage 2 -  Partial thickness loss of dermis presenting as a shallow open injury with a red, pink wound bed without slough. 11/29/19  1100  -- 1326            Intake/Output Last 24 hours No intake or output data in the 24 hours ending 07/17/23 2333  Labs/Imaging Results for orders placed or performed during the hospital encounter of 07/17/23 (from the past 48 hours)  Comprehensive metabolic panel     Status: Abnormal   Collection Time: 07/17/23  6:38 PM  Result Value Ref Range   Sodium 136 135 - 145 mmol/L   Potassium 3.8 3.5 - 5.1 mmol/L   Chloride 98 98 - 111 mmol/L   CO2 27 22 - 32 mmol/L   Glucose, Bld 113 (H) 70 - 99 mg/dL    Comment: Glucose reference range applies only to samples taken after fasting for at least 8 hours.   BUN 36 (H) 8 - 23 mg/dL   Creatinine, Ser 1.61 (H) 0.61 - 1.24 mg/dL   Calcium 8.1 (L) 8.9 - 10.3 mg/dL   Total Protein 6.3 (L) 6.5 - 8.1 g/dL   Albumin 2.7 (L) 3.5 - 5.0 g/dL   AST 33 15 - 41 U/L   ALT 44 0 - 44 U/L   Alkaline Phosphatase 145 (H) 38 - 126 U/L   Total Bilirubin 0.7 <1.2 mg/dL   GFR, Estimated 14 (L) >60 mL/min    Comment: (NOTE) Calculated using the CKD-EPI Creatinine Equation (2021)    Anion gap 11 5 - 15    Comment: Performed at [facility], [address], [place], [place] [address]  CBC with Differential     Status: Abnormal   Collection Time: 07/17/23  6:38 PM  Result Value Ref Range   WBC 8.8 4.0 - 10.5 K/uL   RBC 2.37 (L) 4.22 - 5.81 MIL/uL   Hemoglobin 7.5 (L) 13.0 - 17.0 g/dL   HCT 54.0 (L) 98.1 - 19.1 %   MCV 102.5 (H) 80.0 - 100.0 fL   MCH 31.6 26.0 - 34.0 pg   MCHC 30.9 30.0 - 36.0 g/dL   RDW 47.8 (H) 29.5 - 62.1 %   Platelets 160 150 - 400 K/uL   nRBC 0.0 0.0 - 0.2 %   Neutrophils Relative % 74 %    Neutro Abs 6.6 1.7 - 7.7 K/uL   Lymphocytes Relative 15 %   Lymphs Abs 1.3 0.7 - 4.0 K/uL   Monocytes Relative 8 %   Monocytes Absolute 0.7 0.1 - 1.0 K/uL   Eosinophils Relative 1 %   Eosinophils Absolute 0.1 0.0 - 0.5 K/uL   Basophils Relative 0 %   Basophils Absolute 0.0 0.0 - 0.1 K/uL   Immature Granulocytes 2 %  Abs Immature Granulocytes 0.16 (H) 0.00 - 0.07 K/uL    Comment: Performed at [facility], [address] [place], [place] [address]  Protime-INR     Status: Abnormal   Collection Time: 07/17/23  6:38 PM  Result Value Ref Range   Prothrombin Time 19.4 (H) 11.4 - 15.2 seconds   INR 1.6 (H) 0.8 - 1.2    Comment: (NOTE) INR goal varies based on device and disease states. Performed at [facility], [address], [place], [place] [address]   Type and screen [facility]     Status: None   Collection Time: 07/17/23  6:40 PM  Result Value Ref Range   ABO/RH(D) O NEG    Antibody Screen NEG    Sample Expiration      07/20/2023,2359 Performed at [facility], [address] [place], [place] [address]   POC occult blood, ED     Status: Abnormal   Collection Time: 07/17/23  9:58 PM  Result Value Ref Range   Fecal Occult Bld POSITIVE (A) NEGATIVE   No results found.  Pending Labs Unresulted Labs (From admission, onward)     Start     Ordered   07/18/23 0500  CBC  Tomorrow morning,   R        07/17/23 2302   07/18/23 0500  Basic metabolic panel  Tomorrow morning,   R        07/17/23 2302            Vitals/Pain Today's Vitals   07/17/23 2100 07/17/23 2200 07/17/23 2236 07/17/23 2300  BP: (!) 151/71 (!) 151/67  (!) 155/70  Pulse: 70 65  69  Resp: (!) 29 14  (!) 25  Temp:   98 F (36.7 C)   TempSrc:   Oral   SpO2: 98% 97%  100%  PainSc:        Isolation Precautions No active isolations  Medications Medications  pantoprazole (PROTONIX) injection 40 mg (has no administration in time range)  acetaminophen (TYLENOL)  tablet 650 mg (has no administration in time range)    Or  acetaminophen (TYLENOL) suppository 650 mg (has no administration in time range)  ondansetron (ZOFRAN) tablet 4 mg (has no administration in time range)    Or  ondansetron (ZOFRAN) injection 4 mg (has no administration in time range)  amiodarone (PACERONE) tablet 200 mg (has no administration in time range)  atorvastatin (LIPITOR) tablet 20 mg (has no administration in time range)  bethanechol (URECHOLINE) tablet 5 mg (has no administration in time range)  ferrous sulfate tablet 325 mg (has no administration in time range)  metoprolol succinate (TOPROL-XL) 24 hr tablet 12.5 mg (has no administration in time range)  tamsulosin (FLOMAX) capsule 0.4 mg (has no administration in time range)  pantoprazole (PROTONIX) injection 40 mg (has no administration in time range)    Mobility walks with device     Focused Assessments Cardiac Assessment Handoff:    No results found for: "CKTOTAL", "CKMB", "CKMBINDEX", "TROPONINI" No results found for: "DDIMER" Does the Patient currently have chest pain? No    R Recommendations: See Admitting Provider Note  Report given to:   Additional Notes:

## 2023-07-17 NOTE — ED Notes (Signed)
Call given to 44M to notify of pt. arrival

## 2023-07-17 NOTE — Assessment & Plan Note (Addendum)
Cont amiodarone, metoprolol Holding eliquis in setting of GIB / ABLA

## 2023-07-17 NOTE — Assessment & Plan Note (Signed)
Call nephro in AM for routine IP dialysis during stay.

## 2023-07-17 NOTE — ED Provider Notes (Addendum)
[facility] AT [facility] Provider Note   CSN: [id number] Arrival date & time: 07/17/23  1802     History  Chief Complaint  Patient presents with   Abnormal Lab    [name] is a 67 y.o. male.   Abnormal Lab    67 year old male with medical history significant for DM2, CHF, HTN, HLD, ESRD on HD Tuesday Thursday Saturday, DVT on Eliquis presenting to the emergency department with concern for low hemoglobin.  The patient presents from [facility] for low hemoglobin.  Found on his daily labs, Hgb 6.9.  The patient denies any fatigue, chest pain, shortness of breath, lightheadedness.  No syncopal events.  He denies any dark tarry stools.  On chart review his hemoglobin has ranged from the mid sevens to nines.  Was last Measured at 9.6 and 10 nine months ago. He denies any abdominal pain or any other complaints.  Last underwent dialysis on Saturday, is due tomorrow.  Home Medications Prior to Admission medications   Medication Sig Start Date End Date Taking? Authorizing Provider  amiodarone (PACERONE) 200 MG tablet Take 1 tablet (200 mg total) by mouth daily. 10/15/22 07/17/23 Yes McDiarmid, Leighton Roach, MD  apixaban (ELIQUIS) 5 MG TABS tablet Take 1 tablet (5 mg total) by mouth 2 (two) times daily. 10/06/22  Yes Ganta, Anupa, DO  atorvastatin (LIPITOR) 20 MG tablet Take 1 tablet (20 mg total) by mouth daily. 12/17/19  Yes Christian, Rylee, MD  bethanechol (URECHOLINE) 10 MG tablet Take 5 mg by mouth 3 (three) times daily.   Yes [provider]  ferrous sulfate 325 (65 FE) MG tablet Take 325 mg by mouth daily with breakfast.   Yes [provider]  metoprolol succinate (TOPROL-XL) 25 MG 24 hr tablet Take 12.5 mg by mouth 3 (three) times a week. 12/20/22  Yes [provider]  PROMETHAZINE HCL IM Inject 25 mg into the muscle every 4 (four) hours as needed (Nausea and Vomiting).   Yes [provider]  tamsulosin (FLOMAX) 0.4 MG CAPS  capsule Take 0.4 mg by mouth See admin instructions. Take one tablet by mouth 3 times a week on Monday, Wednesday and Fridays 12/20/22  Yes [provider]      Allergies    Tramadol    Review of Systems   Review of Systems  All other systems reviewed and are negative.   Physical Exam Updated Vital Signs BP (!) 151/67   Pulse 65   Temp (!) 97.4 F (36.3 C) (Oral)   Resp 14   SpO2 97%  Physical Exam Vitals and nursing note reviewed. Exam conducted with a chaperone present.  Constitutional:      General: He is not in acute distress. HENT:     Head: Normocephalic and atraumatic.  Eyes:     Conjunctiva/sclera: Conjunctivae normal.     Pupils: Pupils are equal, round, and reactive to light.  Cardiovascular:     Rate and Rhythm: Normal rate and regular rhythm.  Pulmonary:     Effort: Pulmonary effort is normal. No respiratory distress.     Breath sounds: Normal breath sounds.  Abdominal:     General: There is no distension.     Tenderness: There is no abdominal tenderness. There is no guarding.  Genitourinary:    Comments: Stool appears to be melanotic, guiac positive. Musculoskeletal:        General: No deformity or signs of injury.     Cervical back: Neck supple.  Comments: Left upper extremity AV fistula in place, palpable thrill, audible bruit  Skin:    Coloration: Skin is pale.     Findings: No lesion or rash.  Neurological:     General: No focal deficit present.     Mental Status: He is alert. Mental status is at baseline.     ED Results / Procedures / Treatments   Labs (all labs ordered are listed, but only abnormal results are displayed) Labs Reviewed  COMPREHENSIVE METABOLIC PANEL - Abnormal; Notable for the following components:      Result Value   Glucose, Bld 113 (*)    BUN 36 (*)    Creatinine, Ser 4.53 (*)    Calcium 8.1 (*)    Total Protein 6.3 (*)    Albumin 2.7 (*)    Alkaline Phosphatase 145 (*)    GFR, Estimated 14 (*)    All  other components within normal limits  CBC WITH DIFFERENTIAL/PLATELET - Abnormal; Notable for the following components:   RBC 2.37 (*)    Hemoglobin 7.5 (*)    HCT 24.3 (*)    MCV 102.5 (*)    RDW 17.4 (*)    Abs Immature Granulocytes 0.16 (*)    All other components within normal limits  PROTIME-INR - Abnormal; Notable for the following components:   Prothrombin Time 19.4 (*)    INR 1.6 (*)    All other components within normal limits  POC OCCULT BLOOD, ED - Abnormal; Notable for the following components:   Fecal Occult Bld POSITIVE (*)    All other components within normal limits  TYPE AND SCREEN    EKG None  Radiology No results found.  Procedures Procedures    Medications Ordered in ED Medications - No data to display  ED Course/ Medical Decision Making/ A&P Clinical Course as of 07/17/23 2233  Mon Jul 17, 2023  1916 Hemoglobin(!): 7.5 [JL]  2208 Fecal Occult Blood, POC(!): POSITIVE [JL]    Clinical Course User Index [JL] Ernie Avena, MD                                 Medical Decision Making Amount and/or Complexity of Data Reviewed Labs: ordered. Decision-making details documented in ED Course.  Risk Prescription drug management. Decision regarding hospitalization.     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  The patient presents from [facility] for low hemoglobin.  Found on his daily labs, Hgb 6.9.  The patient denies any fatigue, chest pain, shortness of breath, lightheadedness.  No syncopal events.  He denies any dark tarry stools.  On chart review his hemoglobin has ranged from the mid sevens to nines.  Was last Measured at 9.6 and 10 nine months ago. He denies any abdominal pain or any other complaints.  Last underwent dialysis on Saturday, is due tomorrow.  On arrival, the patient was  afebrile, not tachycardic, hemodynamically stable, BP 113/83, saturating well on room air.  Patient physical exam revealed clear lungs, pale appearing gentleman in no distress.  He denies any symptoms of anemia at this time.  Has no complaints.  Denies any dark tarry stools.  Rectal exam revealed stool present, fecal occult positive.   Laboratory evaluation reveals hemoglobin of 7.5 which is a roughly 2 point drop from previous  measurements, CMP without significant electrolyte abnormality, normal potassium at 3.8, BUN 36, creatinine is 4.53.  Patient has clear lungs, is in no distress, scheduled for dialysis tomorrow.  He has no symptoms of anemia at this time.  No evidence for acute GI bleed.  Suspect likely anemia of chronic disease.  No indication for blood transfusion at this time. PT is on Eliquis.  Patient with fecal occult positive stool, has had a 2 point hemoglobin drop since previous measurements, not requiring blood transfusion but concern for GI bleed and developing anemia.  Medicine consulted for admission for observation, plan for GI consultation inpatient. Pt given Protonix, Dr. Marina Goodell of GI messaged to see the patient in consultation. Dr. Julian Reil accepted the patient in admission.   Final Clinical Impression(s) / ED Diagnoses Final diagnoses:  Anemia, unspecified type  Gastrointestinal hemorrhage, unspecified gastrointestinal hemorrhage type  Melena    Rx / DC Orders ED Discharge Orders     None         Ernie Avena, MD 07/17/23 2232    Ernie Avena, MD 07/17/23 2694    Ernie Avena, MD 07/17/23 2248

## 2023-07-17 NOTE — Assessment & Plan Note (Signed)
Looks like pt no longer on DM meds.

## 2023-07-17 NOTE — H&P (Signed)
History and Physical    Patient: [name] UXL:[id number] DOB: [birth year] DOA: 07/17/2023 DOS: the patient was seen and examined on 07/17/2023 PCP: Patient, No Pcp Per  Patient coming from: SNF  Chief Complaint:  Chief Complaint  Patient presents with   Abnormal Lab   HPI: [name] is a 67 y.o. 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  A.Fib on eliquis, HF recovered EF on Echo earlier this year, grade 1 DD.  Pt in to ED with low HGB.  Pt had lab draw today, found to have HGB 6.9, this is down significantly from 11.0 last month according to nephrologist dialysis note from 11/19 in care everywhere.  11.1 in Oct.  The patient denies any fatigue, chest pain, shortness of breath, lightheadedness. No syncopal events. He denies any dark tarry stools.  In ED: Dark stool, hemoccult positive.  Not tarry.  Does look like he's on PO iron.  Review of Systems: As mentioned in the history of present illness. All other systems reviewed and are negative. Past Medical History:  Diagnosis Date   CHF (congestive heart failure) (HCC)    Chronic kidney disease    Diabetes mellitus without complication (HCC)    PATIENT JUST LEARNED HE WAS DIABETIC   Hepatitis    Hyperlipemia    Hypertension    Necrotizing fasciitis (HCC)    Pneumonia    HX OF PNA   Past Surgical History:  Procedure Laterality Date   AMPUTATION Left 09/11/2013   Procedure: AMPUTATION BELOW KNEE;  Surgeon: Cheral Almas, MD;  Location: [facility] OR;  Service: Orthopedics;  Laterality: Left;   AMPUTATION Right 12/18/2013   Procedure: RIGHT FOOT 1,2, TOE AMPUTATION  5th toe RAY AMPUTATION;  Surgeon: Cheral Almas, MD;  Location: [facility] OR;  Service: Orthopedics;  Laterality: Right;   AV FISTULA PLACEMENT Left 12/11/2019   Procedure: LEFT BRACHIOCEPHALIC FISTULA CREATION;  Surgeon: Larina Earthly, MD;  Location: [facility] OR;  Service: Vascular;  Laterality: Left;    CHOLECYSTECTOMY     I & D EXTREMITY Left 09/03/2013   Procedure: IRRIGATION AND DEBRIDEMENT EXTREMITY;  Surgeon: Cheral Almas, MD;  Location: [facility] OR;  Service: Orthopedics;  Laterality: Left;   I & D EXTREMITY Left 09/11/2013   Procedure: LEFT FOOT IRRIGATION AND DEBRIDEMENT;  Surgeon: Cheral Almas, MD;  Location: [facility] OR;  Service: Orthopedics;  Laterality: Left;   INSERTION OF DIALYSIS CATHETER  12/11/2019   Procedure: Insertion Of Dialysis Catheter;  Surgeon: Larina Earthly, MD;  Location: [facility] OR;  Service: Vascular;;   INSERTION OF DIALYSIS CATHETER Right 07/11/2022   Procedure: INSERTION OF TUNNELED DIALYSIS CATHETER;  Surgeon: Chuck Hint, MD;  Location: [facility] OR;  Service: Vascular;  Laterality: Right;   LEFT HEART CATH AND CORONARY ANGIOGRAPHY N/A 12/13/2019   Procedure: LEFT HEART CATH AND CORONARY ANGIOGRAPHY;  Surgeon: Corky Crafts, MD;  Location: [facility] INVASIVE CV LAB;  Service: Cardiovascular;  Laterality: N/A;   REVISON OF ARTERIOVENOUS FISTULA Left 07/11/2022   Procedure: REVISON OF LEFT ARM ARTERIOVENOUS FISTULA;  Surgeon: Chuck Hint, MD;  Location: [facility] OR;  Service: Vascular;  Laterality: Left;   RIGHT HEART CATH N/A 12/13/2019   Procedure: RIGHT HEART CATH;  Surgeon: Corky Crafts, MD;  Location: [facility] INVASIVE CV LAB;  Service: Cardiovascular;  Laterality: N/A;   Social History:  reports that he has never smoked. He has never used smokeless tobacco. He reports  that he does not drink alcohol and does not use drugs.  Allergies  Allergen Reactions   Tramadol Nausea And Vomiting    Pt states he took this on an empty stomach one time and it came right back up. He's not had it since.     Family History  Problem Relation Age of Onset   Diabetes type II Mother    Dementia Mother    Heart disease Father     Prior to Admission medications   Medication Sig Start Date End Date Taking? Authorizing Provider  amiodarone (PACERONE) 200 MG tablet Take 1  tablet (200 mg total) by mouth daily. 10/15/22 07/17/23 Yes McDiarmid, Leighton Roach, MD  apixaban (ELIQUIS) 5 MG TABS tablet Take 1 tablet (5 mg total) by mouth 2 (two) times daily. 10/06/22  Yes Ganta, Anupa, DO  atorvastatin (LIPITOR) 20 MG tablet Take 1 tablet (20 mg total) by mouth daily. 12/17/19  Yes Christian, Rylee, MD  bethanechol (URECHOLINE) 10 MG tablet Take 5 mg by mouth 3 (three) times daily.   Yes [provider]  ferrous sulfate 325 (65 FE) MG tablet Take 325 mg by mouth daily with breakfast.   Yes [provider]  metoprolol succinate (TOPROL-XL) 25 MG 24 hr tablet Take 12.5 mg by mouth 3 (three) times a week. 12/20/22  Yes [provider]  PROMETHAZINE HCL IM Inject 25 mg into the muscle every 4 (four) hours as needed (Nausea and Vomiting).   Yes [provider]  tamsulosin (FLOMAX) 0.4 MG CAPS capsule Take 0.4 mg by mouth See admin instructions. Take one tablet by mouth 3 times a week on Monday, Wednesday and Fridays 12/20/22  Yes [provider]    Physical Exam: Vitals:   07/17/23 2026 07/17/23 2100 07/17/23 2200 07/17/23 2236  BP: (!) 142/69 (!) 151/71 (!) 151/67   Pulse:  70 65   Resp: 18 (!) 29 14   Temp:    98 F (36.7 C)  TempSrc:    Oral  SpO2:  98% 97%    Constitutional: NAD, calm, comfortable, Pale Respiratory: clear to auscultation bilaterally, no wheezing, no crackles. Normal respiratory effort. No accessory muscle use.  Cardiovascular: Regular rate and rhythm, no murmurs / rubs / gallops. No extremity edema. 2+ pedal pulses. No carotid bruits.  Abdomen: no tenderness, no masses palpated. No hepatosplenomegaly. Bowel sounds positive.  Musculoskeletal: L BKA Neurologic: CN 2-12 grossly intact. Sensation intact, DTR normal. Strength 5/5 in all 4.  Psychiatric: Normal judgment and insight. Alert and oriented x 3. Normal mood.   Data Reviewed:    Labs on Admission: I have personally reviewed following labs and imaging  studies  CBC: Recent Labs  Lab 07/17/23 1838  WBC 8.8  NEUTROABS 6.6  HGB 7.5*  HCT 24.3*  MCV 102.5*  PLT 160   Basic Metabolic Panel: Recent Labs  Lab 07/17/23 1838  NA 136  K 3.8  CL 98  CO2 27  GLUCOSE 113*  BUN 36*  CREATININE 4.53*  CALCIUM 8.1*   GFR: CrCl cannot be calculated (Unknown ideal weight.). Liver Function Tests: Recent Labs  Lab 07/17/23 1838  AST 33  ALT 44  ALKPHOS 145*  BILITOT 0.7  PROT 6.3*  ALBUMIN 2.7*   No results for input(s): "LIPASE", "AMYLASE" in the last 168 hours. No results for input(s): "AMMONIA" in the last 168 hours. Coagulation Profile: Recent Labs  Lab 07/17/23 1838  INR 1.6*   Cardiac Enzymes: No results for input(s): "CKTOTAL", "CKMB", "  CKMBINDEX", "TROPONINI" in the last 168 hours. BNP (last 3 results) No results for input(s): "PROBNP" in the last 8760 hours. HbA1C: No results for input(s): "HGBA1C" in the last 72 hours. CBG: No results for input(s): "GLUCAP" in the last 168 hours. Lipid Profile: No results for input(s): "CHOL", "HDL", "LDLCALC", "TRIG", "CHOLHDL", "LDLDIRECT" in the last 72 hours. Thyroid Function Tests: No results for input(s): "TSH", "T4TOTAL", "FREET4", "T3FREE", "THYROIDAB" in the last 72 hours. Anemia Panel: No results for input(s): "VITAMINB12", "FOLATE", "FERRITIN", "TIBC", "IRON", "RETICCTPCT" in the last 72 hours. Urine analysis:    Component Value Date/Time   COLORURINE YELLOW 11/29/2019 0842   APPEARANCEUR HAZY (A) 11/29/2019 0842   LABSPEC 1.014 11/29/2019 0842   PHURINE 7.0 11/29/2019 0842   GLUCOSEU 50 (A) 11/29/2019 0842   HGBUR NEGATIVE 11/29/2019 0842   BILIRUBINUR NEGATIVE 11/29/2019 0842   KETONESUR 5 (A) 11/29/2019 0842   PROTEINUR >=300 (A) 11/29/2019 0842   UROBILINOGEN 0.2 09/15/2013 0052   NITRITE NEGATIVE 11/29/2019 0842   LEUKOCYTESUR SMALL (A) 11/29/2019 0842    Radiological Exams on Admission: No results found.  EKG: Independently  reviewed.   Assessment and Plan: * ABLA (acute blood loss anemia) Acute anemia (HGB 7.5 today down from 11.0 last month), presumably in setting of GIB with hemoccult + stool, dark on exam though pt is on PO iron. Clear liquid diet Protonix Type and screen Repeat CBC in AM, transfuse if needed EDP sending message to GI for eval tomorrow. Hold eliquis  ESRD on dialysis [facility]) Call nephro in AM for routine IP dialysis during stay.  Atrial fibrillation (HCC) Cont amiodarone, metoprolol Holding eliquis in setting of GIB / ABLA  DM (diabetes mellitus) type II controlled, neurological manifestation (HCC) Looks like pt no longer on DM meds.      Advance Care Planning:   Code Status: Full Code  Consults: EDP sending message to on call for GI AM eval  Family Communication: No family in room  Severity of Illness: The appropriate patient status for this patient is OBSERVATION. Observation status is judged to be reasonable and necessary in order to provide the required intensity of service to ensure the patient's safety. The patient's presenting symptoms, physical exam findings, and initial radiographic and laboratory data in the context of their medical condition is felt to place them at decreased risk for further clinical deterioration. Furthermore, it is anticipated that the patient will be medically stable for discharge from the hospital within 2 midnights of admission.   Author: [name]., DO 07/17/2023 11:14 PM  For on call review [email or web address].

## 2023-07-18 ENCOUNTER — Other Ambulatory Visit: Payer: Self-pay

## 2023-07-18 ENCOUNTER — Encounter (HOSPITAL_COMMUNITY): Payer: Self-pay | Admitting: Internal Medicine

## 2023-07-18 DIAGNOSIS — D696 Thrombocytopenia, unspecified: Secondary | ICD-10-CM | POA: Diagnosis present

## 2023-07-18 DIAGNOSIS — E875 Hyperkalemia: Secondary | ICD-10-CM | POA: Diagnosis present

## 2023-07-18 DIAGNOSIS — I4891 Unspecified atrial fibrillation: Secondary | ICD-10-CM | POA: Diagnosis not present

## 2023-07-18 DIAGNOSIS — D6832 Hemorrhagic disorder due to extrinsic circulating anticoagulants: Secondary | ICD-10-CM | POA: Diagnosis present

## 2023-07-18 DIAGNOSIS — R195 Other fecal abnormalities: Secondary | ICD-10-CM

## 2023-07-18 DIAGNOSIS — N186 End stage renal disease: Secondary | ICD-10-CM | POA: Diagnosis present

## 2023-07-18 DIAGNOSIS — K2901 Acute gastritis with bleeding: Secondary | ICD-10-CM | POA: Diagnosis present

## 2023-07-18 DIAGNOSIS — Z7901 Long term (current) use of anticoagulants: Secondary | ICD-10-CM

## 2023-07-18 DIAGNOSIS — K2971 Gastritis, unspecified, with bleeding: Secondary | ICD-10-CM | POA: Diagnosis not present

## 2023-07-18 DIAGNOSIS — L89612 Pressure ulcer of right heel, stage 2: Secondary | ICD-10-CM | POA: Diagnosis not present

## 2023-07-18 DIAGNOSIS — E785 Hyperlipidemia, unspecified: Secondary | ICD-10-CM | POA: Diagnosis present

## 2023-07-18 DIAGNOSIS — K922 Gastrointestinal hemorrhage, unspecified: Secondary | ICD-10-CM | POA: Diagnosis not present

## 2023-07-18 DIAGNOSIS — D649 Anemia, unspecified: Secondary | ICD-10-CM | POA: Diagnosis present

## 2023-07-18 DIAGNOSIS — T45515A Adverse effect of anticoagulants, initial encounter: Secondary | ICD-10-CM | POA: Diagnosis present

## 2023-07-18 DIAGNOSIS — M898X9 Other specified disorders of bone, unspecified site: Secondary | ICD-10-CM | POA: Diagnosis present

## 2023-07-18 DIAGNOSIS — K2960 Other gastritis without bleeding: Secondary | ICD-10-CM | POA: Diagnosis not present

## 2023-07-18 DIAGNOSIS — L89152 Pressure ulcer of sacral region, stage 2: Secondary | ICD-10-CM | POA: Diagnosis present

## 2023-07-18 DIAGNOSIS — I739 Peripheral vascular disease, unspecified: Secondary | ICD-10-CM | POA: Diagnosis present

## 2023-07-18 DIAGNOSIS — Z992 Dependence on renal dialysis: Secondary | ICD-10-CM | POA: Diagnosis not present

## 2023-07-18 DIAGNOSIS — L89322 Pressure ulcer of left buttock, stage 2: Secondary | ICD-10-CM | POA: Diagnosis present

## 2023-07-18 DIAGNOSIS — D631 Anemia in chronic kidney disease: Secondary | ICD-10-CM | POA: Diagnosis present

## 2023-07-18 DIAGNOSIS — E1122 Type 2 diabetes mellitus with diabetic chronic kidney disease: Secondary | ICD-10-CM | POA: Diagnosis present

## 2023-07-18 DIAGNOSIS — K31811 Angiodysplasia of stomach and duodenum with bleeding: Secondary | ICD-10-CM | POA: Diagnosis present

## 2023-07-18 DIAGNOSIS — Z79899 Other long term (current) drug therapy: Secondary | ICD-10-CM | POA: Diagnosis not present

## 2023-07-18 DIAGNOSIS — N2581 Secondary hyperparathyroidism of renal origin: Secondary | ICD-10-CM | POA: Diagnosis present

## 2023-07-18 DIAGNOSIS — I132 Hypertensive heart and chronic kidney disease with heart failure and with stage 5 chronic kidney disease, or end stage renal disease: Secondary | ICD-10-CM | POA: Diagnosis present

## 2023-07-18 DIAGNOSIS — I480 Paroxysmal atrial fibrillation: Secondary | ICD-10-CM | POA: Diagnosis present

## 2023-07-18 DIAGNOSIS — Z89512 Acquired absence of left leg below knee: Secondary | ICD-10-CM | POA: Diagnosis not present

## 2023-07-18 DIAGNOSIS — I5032 Chronic diastolic (congestive) heart failure: Secondary | ICD-10-CM | POA: Diagnosis present

## 2023-07-18 DIAGNOSIS — D62 Acute posthemorrhagic anemia: Secondary | ICD-10-CM | POA: Diagnosis present

## 2023-07-18 LAB — CBC
HCT: 22.3 % — ABNORMAL LOW (ref 39.0–52.0)
Hemoglobin: 7.4 g/dL — ABNORMAL LOW (ref 13.0–17.0)
MCH: 31.8 pg (ref 26.0–34.0)
MCHC: 33.2 g/dL (ref 30.0–36.0)
MCV: 95.7 fL (ref 80.0–100.0)
Platelets: 147 10*3/uL — ABNORMAL LOW (ref 150–400)
RBC: 2.33 MIL/uL — ABNORMAL LOW (ref 4.22–5.81)
RDW: 17 % — ABNORMAL HIGH (ref 11.5–15.5)
WBC: 9 10*3/uL (ref 4.0–10.5)
nRBC: 1 % — ABNORMAL HIGH (ref 0.0–0.2)

## 2023-07-18 LAB — BASIC METABOLIC PANEL
Anion gap: 17 — ABNORMAL HIGH (ref 5–15)
BUN: 46 mg/dL — ABNORMAL HIGH (ref 8–23)
CO2: 21 mmol/L — ABNORMAL LOW (ref 22–32)
Calcium: 7.5 mg/dL — ABNORMAL LOW (ref 8.9–10.3)
Chloride: 103 mmol/L (ref 98–111)
Creatinine, Ser: 5.06 mg/dL — ABNORMAL HIGH (ref 0.61–1.24)
GFR, Estimated: 12 mL/min — ABNORMAL LOW (ref >60)
Glucose, Bld: 115 mg/dL — ABNORMAL HIGH (ref 70–99)
Potassium: 5.8 mmol/L — ABNORMAL HIGH (ref 3.5–5.1)
Sodium: 141 mmol/L (ref 135–145)

## 2023-07-18 LAB — HEPATITIS B SURFACE ANTIGEN: Hepatitis B Surface Ag: NONREACTIVE

## 2023-07-18 LAB — PHOSPHORUS: Phosphorus: 3.3 mg/dL (ref 2.5–4.6)

## 2023-07-18 LAB — GLUCOSE, CAPILLARY: Glucose-Capillary: 100 mg/dL — ABNORMAL HIGH (ref 70–99)

## 2023-07-18 LAB — MRSA NEXT GEN BY PCR, NASAL: MRSA by PCR Next Gen: NOT DETECTED

## 2023-07-18 MED ORDER — ANTICOAGULANT SODIUM CITRATE 4% (200MG/5ML) IV SOLN: 5.0000 mL | Status: DC | PRN

## 2023-07-18 MED ORDER — HEPARIN SODIUM (PORCINE) 1000 UNIT/ML DIALYSIS: 1000.0000 [IU] | INTRAMUSCULAR | Status: DC | PRN

## 2023-07-18 MED ORDER — SODIUM ZIRCONIUM CYCLOSILICATE 10 G PO PACK
10.0000 g | PACK | Freq: Once | ORAL | Status: AC
  Administered 2023-07-18: 10 g via ORAL
  Filled 2023-07-18: qty 1

## 2023-07-18 MED ORDER — PEG-KCL-NACL-NASULF-NA ASC-C 100 G PO SOLR: 0.5000 | Freq: Once | ORAL | Status: DC

## 2023-07-18 MED ORDER — CHLORHEXIDINE GLUCONATE CLOTH 2 % EX PADS
6.0000 | MEDICATED_PAD | Freq: Every day | CUTANEOUS | Status: DC
  Administered 2023-07-18: 6 via TOPICAL

## 2023-07-18 MED ORDER — LIDOCAINE-PRILOCAINE 2.5-2.5 % EX CREA: 1.0000 | TOPICAL_CREAM | CUTANEOUS | Status: DC | PRN

## 2023-07-18 MED ORDER — PENTAFLUOROPROP-TETRAFLUOROETH EX AERO: 1.0000 | INHALATION_SPRAY | CUTANEOUS | Status: DC | PRN

## 2023-07-18 MED ORDER — PEG-KCL-NACL-NASULF-NA ASC-C 100 G PO SOLR: 1.0000 | Freq: Once | ORAL | Status: DC

## 2023-07-18 MED ORDER — NEPRO/CARBSTEADY PO LIQD: 237.0000 mL | ORAL | Status: DC | PRN

## 2023-07-18 MED ORDER — ORAL CARE MOUTH RINSE: 15.0000 mL | OROMUCOSAL | Status: DC | PRN

## 2023-07-18 MED ORDER — PEG-KCL-NACL-NASULF-NA ASC-C 100 G PO SOLR
0.5000 | Freq: Once | ORAL | Status: DC
  Filled 2023-07-18 (×2): qty 1

## 2023-07-18 MED ORDER — LIDOCAINE HCL (PF) 1 % IJ SOLN: 5.0000 mL | INTRAMUSCULAR | Status: DC | PRN

## 2023-07-18 MED ORDER — ALTEPLASE 2 MG IJ SOLR: 2.0000 mg | Freq: Once | INTRAMUSCULAR | Status: DC | PRN

## 2023-07-18 NOTE — Consult Note (Signed)
Consultation Note  Referring Provider:  [facility] PCP: Patient, No Pcp Per Primary Gastroenterologist: Gentry Fitz         Reason for Consultation: Melena  DOA: 07/17/2023         Hospital Day: 2   ASSESSMENT    Brief Narrative:  67 y.o. year old male with a history of DM2, grade 2 diastolic dysfunction , ESRD on dialysis TTS, L BKA , A.Fib on eliquis, cholecystectomy  Acute on chronic anemia / dark stool ( on iron), FOBT+ on Eliquis. No evidence for iron deficiency but get iron infusions Gets IV Fe at dialysis ( got 3 doses in November). History is unclear, he tells me oral iron was just started a week ago and only since have his stools been dark but suspect the dark stools are more due to GI bleed. Rule out bleeding from intestinal AVMs, PUD, colon neoplasm.   AFIB. On eliquis at home Patient tells me that last dose was on Saturday  ESRD on HD TTS  Thrombocytopenia ( platelets 147) , isolated. Platelets typically WNL  Principal Problem:   ABLA (acute blood loss anemia) Active Problems:   DM (diabetes mellitus) type II controlled, neurological manifestation (HCC)   ESRD on dialysis [facility])   Atrial fibrillation (HCC)   GIB (gastrointestinal bleeding)   PLAN:   --Patient need an EGD and colonoscopy for evaluation of anemia. Schedule for EGD. The risks and benefits of EGD and colonoscopy with possible biopsies / polypectomy were discussed with the patient who agrees to proceed.  --Keep on clear liquid only for possible EGD / colonoscopy tomorrow .  --Continue BID IV PPI --Monitor H/H and transfuse if needed.   HPI   Patient admitted yesterday from [place] for anemia..   Pt had labs at outside facility and found to have a hgb of 6.9, down from 10.7 in October. Patient tells me he hasn't seen any blood in his stool but last week his dialysis medication was changed and then he began having dark stools. He thinks that he  was started on oral iron last week? He has no GI symptoms. Specifically no abdominal pain, nausea/vomiting, or bowel changes.  He has never had a colonoscopy.  He denies NSAID use.   Notable labs / Imaging / Events this admission  :   Hemoglobin 7.5 on admission, stable overnight.  MCV 102.5 Platelets 140's-160 Alkaline phosphatase 145, albumin 2.7 INR 1.6 but on Eliquis FOBT positive  Previous GI Evaluations     Labs and Imaging: Recent Labs    07/17/23 1838 07/18/23 0416  WBC 8.8 9.0  HGB 7.5* 7.4*  HCT 24.3* 22.3*  PLT 160 147*   Recent Labs    07/17/23 1838 07/18/23 0416  NA 136 141  K 3.8 5.8*  CL 98 103  CO2 27 21*  GLUCOSE 113* 115*  BUN 36* 46*  CREATININE 4.53* 5.06*  CALCIUM 8.1* 7.5*   Recent Labs    07/17/23 1838  PROT 6.3*  ALBUMIN 2.7*  AST 33  ALT 44  ALKPHOS 145*  BILITOT 0.7   No results for input(s): "HEPBSAG", "HCVAB", "HEPAIGM", "HEPBIGM" in the last 72 hours. Recent Labs    07/17/23 1838  LABPROT 19.4*  INR 1.6*  Past Medical History:  Diagnosis Date   CHF (congestive heart failure) (HCC)    Chronic kidney disease    Diabetes mellitus without complication (HCC)    PATIENT JUST LEARNED HE WAS DIABETIC   Hepatitis    Hyperlipemia    Hypertension    Necrotizing fasciitis (HCC)    Pneumonia    HX OF PNA    Past Surgical History:  Procedure Laterality Date   AMPUTATION Left 09/11/2013   Procedure: AMPUTATION BELOW KNEE;  Surgeon: Cheral Almas, MD;  Location: [facility] OR;  Service: Orthopedics;  Laterality: Left;   AMPUTATION Right 12/18/2013   Procedure: RIGHT FOOT 1,2, TOE AMPUTATION  5th toe RAY AMPUTATION;  Surgeon: Cheral Almas, MD;  Location: [facility] OR;  Service: Orthopedics;  Laterality: Right;   AV FISTULA PLACEMENT Left 12/11/2019   Procedure: LEFT BRACHIOCEPHALIC FISTULA CREATION;  Surgeon: Larina Earthly, MD;  Location: [facility] OR;  Service: Vascular;  Laterality: Left;   CHOLECYSTECTOMY     I & D EXTREMITY Left  09/03/2013   Procedure: IRRIGATION AND DEBRIDEMENT EXTREMITY;  Surgeon: Cheral Almas, MD;  Location: [facility] OR;  Service: Orthopedics;  Laterality: Left;   I & D EXTREMITY Left 09/11/2013   Procedure: LEFT FOOT IRRIGATION AND DEBRIDEMENT;  Surgeon: Cheral Almas, MD;  Location: [facility] OR;  Service: Orthopedics;  Laterality: Left;   INSERTION OF DIALYSIS CATHETER  12/11/2019   Procedure: Insertion Of Dialysis Catheter;  Surgeon: Larina Earthly, MD;  Location: [facility] OR;  Service: Vascular;;   INSERTION OF DIALYSIS CATHETER Right 07/11/2022   Procedure: INSERTION OF TUNNELED DIALYSIS CATHETER;  Surgeon: Chuck Hint, MD;  Location: [facility] OR;  Service: Vascular;  Laterality: Right;   LEFT HEART CATH AND CORONARY ANGIOGRAPHY N/A 12/13/2019   Procedure: LEFT HEART CATH AND CORONARY ANGIOGRAPHY;  Surgeon: Corky Crafts, MD;  Location: [facility] INVASIVE CV LAB;  Service: Cardiovascular;  Laterality: N/A;   REVISON OF ARTERIOVENOUS FISTULA Left 07/11/2022   Procedure: REVISON OF LEFT ARM ARTERIOVENOUS FISTULA;  Surgeon: Chuck Hint, MD;  Location: [facility] OR;  Service: Vascular;  Laterality: Left;   RIGHT HEART CATH N/A 12/13/2019   Procedure: RIGHT HEART CATH;  Surgeon: Corky Crafts, MD;  Location: [facility] INVASIVE CV LAB;  Service: Cardiovascular;  Laterality: N/A;    Family History  Problem Relation Age of Onset   Diabetes type II Mother    Dementia Mother    Heart disease Father     Prior to Admission medications   Medication Sig Start Date End Date Taking? Authorizing Provider  amiodarone (PACERONE) 200 MG tablet Take 1 tablet (200 mg total) by mouth daily. 10/15/22 07/17/23 Yes McDiarmid, Leighton Roach, MD  apixaban (ELIQUIS) 5 MG TABS tablet Take 1 tablet (5 mg total) by mouth 2 (two) times daily. 10/06/22  Yes Ganta, Anupa, DO  atorvastatin (LIPITOR) 20 MG tablet Take 1 tablet (20 mg total) by mouth daily. 12/17/19  Yes Christian, Rylee, MD  bethanechol (URECHOLINE) 10 MG tablet Take 5 mg  by mouth 3 (three) times daily.   Yes [provider]  ferrous sulfate 325 (65 FE) MG tablet Take 325 mg by mouth daily with breakfast.   Yes [provider]  metoprolol succinate (TOPROL-XL) 25 MG 24 hr tablet Take 12.5 mg by mouth 3 (three) times a week. 12/20/22  Yes [provider]  PROMETHAZINE HCL IM Inject 25 mg into the muscle every 4 (four) hours as needed (Nausea and Vomiting).   Yes  [provider]  tamsulosin (FLOMAX) 0.4 MG CAPS capsule Take 0.4 mg by mouth See admin instructions. Take one tablet by mouth 3 times a week on Monday, Wednesday and Fridays 12/20/22  Yes [provider]    Current Facility-Administered Medications  Medication Dose Route Frequency Provider Last Rate Last Admin   acetaminophen (TYLENOL) tablet 650 mg  650 mg Oral Q6H PRN [name], DO       Or   acetaminophen (TYLENOL) suppository 650 mg  650 mg Rectal Q6H PRN [name], DO       amiodarone (PACERONE) tablet 200 mg  200 mg Oral Daily Julian Reil, Jared M, DO       atorvastatin (LIPITOR) tablet 20 mg  20 mg Oral Daily Julian Reil, Jared M, DO       bethanechol (URECHOLINE) tablet 5 mg  5 mg Oral TID [name], DO       Chlorhexidine Gluconate Cloth 2 % PADS 6 each  6 each Topical Q0600 Delano Metz, MD       ferrous sulfate tablet 325 mg  325 mg Oral Q breakfast [name], DO   325 mg at 07/18/23 0825   [START ON 07/19/2023] metoprolol succinate (TOPROL-XL) 24 hr tablet 12.5 mg  12.5 mg Oral Once per day on Monday Wednesday Friday [name], DO       ondansetron [facility]) tablet 4 mg  4 mg Oral Q6H PRN [name], DO       Or   ondansetron [facility]) injection 4 mg  4 mg Intravenous Q6H PRN [name], DO       Oral care mouth rinse  15 mL Mouth Rinse PRN [name], DO       pantoprazole (PROTONIX) injection 40 mg  40 mg Intravenous Q12H Lyda Perone M, DO       [START ON 07/19/2023] tamsulosin [facility]) capsule 0.4  mg  0.4 mg Oral Once per day on Monday Wednesday Friday [name], DO        Allergies as of 07/17/2023 - Review Complete 07/17/2023  Allergen Reaction Noted   Tramadol Nausea And Vomiting 09/20/2013    Social History   Socioeconomic History   Marital status: Single    Spouse name: Not on file   Number of children: Not on file   Years of education: Not on file   Highest education level: Not on file  Occupational History   Not on file  Tobacco Use   Smoking status: Never   Smokeless tobacco: Never  Vaping Use   Vaping status: Never Used  Substance and Sexual Activity   Alcohol use: No   Drug use: No   Sexual activity: Not on file  Other Topics Concern   Not on file  Social History Narrative   Lives in [place]   Social Drivers of Health   Financial Resource Strain: Not on file  Food Insecurity: No Food Insecurity (07/18/2023)   Hunger Vital Sign    Worried About Running Out of Food in the Last Year: Never true    Ran Out of Food in the Last Year: Never true  Transportation Needs: No Transportation Needs (07/18/2023)   PRAPARE - Transportation    Lack of Transportation (Medical): No    Lack of Transportation (Non-Medical): No  Physical Activity: Not on file  Stress: Not on file  Social Connections: Not on file  Intimate Partner Violence: Not At Risk (07/18/2023)   Humiliation,  Afraid, Rape, and Kick questionnaire    Fear of Current or Ex-Partner: No    Emotionally Abused: No    Physically Abused: No    Sexually Abused: No     Code Status   Code Status: Full Code  Review of Systems: All systems reviewed and negative except where noted in HPI.  Physical Exam: Vital signs in last 24 hours: Temp:  [97.4 F (36.3 C)-98 F (36.7 C)] 97.9 F (36.6 C) (12/17 0510) Pulse Rate:  [65-70] 70 (12/17 0510) Resp:  [14-29] 18 (12/17 0510) BP: (113-155)/(63-83) 145/63 (12/17 0510) SpO2:  [97 %-100 %] 98 % (12/17 0510) Last BM Date : 07/18/23  General:   Pleasant male in NAD Psych:  Cooperative. Normal mood and affect Eyes: Pupils equal Ears:  Normal auditory acuity Nose: No deformity, discharge or lesions Neck:  Supple, no masses felt Lungs:  Clear to auscultation.  Heart:  Regular rate.  Abdomen:  Soft, nondistended, nontender, active bowel sounds, no masses felt Rectal :  Deferred Msk: Symmetrical without gross deformities.  Neurologic:  Alert, oriented, grossly normal neurologically Skin:  Intact without significant lesions.    Intake/Output from previous day: 12/16 0701 - 12/17 0700 In: 120 [P.O.:120] Out: 0  Intake/Output this shift:  No intake/output data recorded.   Willette Cluster, NP-C   07/18/2023, 10:06 AM

## 2023-07-18 NOTE — Plan of Care (Signed)
  Problem: Education: Goal: Knowledge of General Education information will improve Description Including pain rating scale, medication(s)/side effects and non-pharmacologic comfort measures Outcome: Progressing   

## 2023-07-18 NOTE — Procedures (Signed)
I have reviewed the HD regimen and made appropriate changes.  Vinson Moselle MD  [facility] 07/18/2023, 3:20 PM

## 2023-07-18 NOTE — Progress Notes (Signed)
New Admission Note:  Arrival Method: Stretcher Mental Orientation: Alert and oriented x 4 Telemetry: Box 19 Assessment: Completed Skin: Warm and dry. Pressure injury in left lower buttock and coccyx IV: NSL Pain: Denies Tubes: N/A Safety Measures: Safety Fall Prevention Plan initiated.  Admission: Completed 5 M  Orientation: Patient has been orientated to the room, unit and the staff. Welcome booklet given.  Family: N/A  Orders have been reviewed and implemented. Will continue to monitor the patient. Call light has been placed within reach and bed alarm has been activated.   Guilford Shi BSN, RN  Phone Number: [phone]

## 2023-07-18 NOTE — Progress Notes (Signed)
Pt receives out-pt HD at [facility] on TTS. Will assist as needed.   Timera Windt Renal Navigator [phone] 

## 2023-07-18 NOTE — Progress Notes (Signed)
[facility] PROGRESS NOTE  Spurgeon Ehly (DOB: 23-Jul-1956) ZHY:[id number] PCP: Patient, No Pcp Per  Brief Narrative: [name] is a 67 y.o. male with a history of ESRD (HD TTS), left BKA, AFib on eliquis, HFrecoveredEF, T2DM who was sent to the ED on 07/17/2023 due to low hemoglobin found on outpatient labs. Hgb was reportedly 6.9g/dl down from recent baseline of 10-11. He has dark stool in the setting of taking oral iron, which was +FOBT in the ED. He had no specific complaints.   Subjective: "I feel fine." He has a fully negative ROS. Just here because he was told to come.  Objective: BP (!) 145/69 (BP Location: Right Arm)   Pulse 67   Temp 98.1 F (36.7 C) (Oral)   Resp 16   Wt 56.1 kg Comment: bed  SpO2 100%   BMI 22.62 kg/m   Gen: No distress Pulm: Clear, nonlabored  CV: RRR, no MRG, no pitting edema GI: Soft, NT, ND, +BS, easily reduced hernia.  Neuro: Alert and oriented. No new focal deficits. Ext: Warm, dry right toe and left BK amputations Skin: No open wounds on visualized skin   Assessment & Plan: ABLA on anemia of ESRD:  - Acuity of hgb drop and +FOBT necessitates investigation. GI consulted, will pursue EGD/colonoscopy, diet orders in. Holding DOAC (last dose 12/15). Confirmed pt would consent to transfusion if threshold of 7g/dl is met.   ESRD:  - Routine HD per nephrology while here. Defer ESA to them (due 12/21).   Hyperkalemia: Given lokelma, changed to renal diet, dialysis today. Stay on telemetry.  PAF:  - Continue metoprolol, amiodarone, hold eliquis.   T2DM: No home meds for this. Last HbA1c was 5.6%.   HLD:  - Continue statin  Tyrone Nine, MD [facility] [email or web address] 07/18/2023, 5:12 PM

## 2023-07-18 NOTE — Plan of Care (Signed)
  Problem: Education: Goal: Knowledge of General Education information will improve Description: Including pain rating scale, medication(s)/side effects and non-pharmacologic comfort measures Outcome: Progressing   Problem: Health Behavior/Discharge Planning: Goal: Ability to manage health-related needs will improve Outcome: Progressing   Problem: Clinical Measurements: Goal: Ability to maintain clinical measurements within normal limits will improve Outcome: Progressing Goal: Will remain free from infection Outcome: Progressing Goal: Diagnostic test results will improve Outcome: Progressing Goal: Respiratory complications will improve Outcome: Progressing Goal: Cardiovascular complication will be avoided Outcome: Progressing   Problem: Activity: Goal: Risk for activity intolerance will decrease Outcome: Progressing   Problem: Nutrition: Goal: Adequate nutrition will be maintained Outcome: Progressing   Problem: Coping: Goal: Level of anxiety will decrease Outcome: Progressing   Problem: Elimination: Goal: Will not experience complications related to bowel motility Outcome: Progressing Goal: Will not experience complications related to urinary retention Outcome: Progressing   Problem: Pain Management: Goal: General experience of comfort will improve Outcome: Progressing   Problem: Safety: Goal: Ability to remain free from injury will improve Outcome: Progressing   Problem: Skin Integrity: Goal: Risk for impaired skin integrity will decrease Outcome: Progressing   Problem: Education: Goal: Ability to identify signs and symptoms of gastrointestinal bleeding will improve Outcome: Progressing   Problem: Bowel/Gastric: Goal: Will show no signs and symptoms of gastrointestinal bleeding Outcome: Progressing   Problem: Fluid Volume: Goal: Will show no signs and symptoms of excessive bleeding Outcome: Progressing   Problem: Clinical Measurements: Goal:  Complications related to the disease process, condition or treatment will be avoided or minimized Outcome: Progressing   Problem: Education: Goal: Knowledge of disease and its progression will improve Outcome: Progressing Goal: Individualized Educational Video(s) Outcome: Progressing   Problem: Fluid Volume: Goal: Compliance with measures to maintain balanced fluid volume will improve Outcome: Progressing   Problem: Health Behavior/Discharge Planning: Goal: Ability to manage health-related needs will improve Outcome: Progressing   Problem: Nutritional: Goal: Ability to make healthy dietary choices will improve Outcome: Progressing   Problem: Clinical Measurements: Goal: Complications related to the disease process, condition or treatment will be avoided or minimized Outcome: Progressing

## 2023-07-18 NOTE — Progress Notes (Signed)
Received patient in bed to unit.  Alert and oriented.  Informed consent signed and in chart.   TX duration:3  Patient tolerated well. No uf off majority of tx due to low BP  Transported back to the room  Alert, without acute distress.  Hand-off given to patient's nurse.   Access used: left AVF Access issues: NONE  Total UF removed: Medication(s) given: NONE   07/18/23 1851  Vitals  Temp 97.9 F (36.6 C)  Temp Source Oral  BP 115/62  MAP (mmHg) 79  BP Location Right Arm  BP Method Automatic  Patient Position (if appropriate) Lying  Pulse Rate 77  ECG Heart Rate 78  Resp (!) 25  Oxygen Therapy  SpO2 100 %  O2 Device Room Air  During Treatment Monitoring  Blood Flow Rate (mL/min) 399 mL/min  Arterial Pressure (mmHg) -138.78 mmHg  Venous Pressure (mmHg) 236.15 mmHg  TMP (mmHg) -6.26 mmHg  Ultrafiltration Rate (mL/min) 0 mL/min  Dialysate Flow Rate (mL/min) 299 ml/min  Dialysate Potassium Concentration 2  Dialysate Calcium Concentration 2.5  Duration of HD Treatment -hour(s) 3 hour(s)  Cumulative Fluid Removed (mL) per Treatment  217.98  HD Safety Checks Performed Yes  Intra-Hemodialysis Comments Tolerated well;Tx completed  Dialysis Fluid Bolus Normal Saline  Bolus Amount (mL) 300 mL      Amberle Lyter [facility]

## 2023-07-18 NOTE — H&P (View-Only) (Signed)
Consultation Note  Referring Provider:  [facility] PCP: Patient, No Pcp Per Primary Gastroenterologist: Gentry Fitz         Reason for Consultation: Melena  DOA: 07/17/2023         Hospital Day: 2   ASSESSMENT    Brief Narrative:  67 y.o. year old male with a history of DM2, grade 2 diastolic dysfunction , ESRD on dialysis TTS, L BKA , A.Fib on eliquis, cholecystectomy  Acute on chronic anemia / dark stool ( on iron), FOBT+ on Eliquis. No evidence for iron deficiency but get iron infusions Gets IV Fe at dialysis ( got 3 doses in November). History is unclear, he tells me oral iron was just started a week ago and only since have his stools been dark but suspect the dark stools are more due to GI bleed. Rule out bleeding from intestinal AVMs, PUD, colon neoplasm.   AFIB. On eliquis at home Patient tells me that last dose was on Saturday  ESRD on HD TTS  Thrombocytopenia ( platelets 147) , isolated. Platelets typically WNL  Principal Problem:   ABLA (acute blood loss anemia) Active Problems:   DM (diabetes mellitus) type II controlled, neurological manifestation (HCC)   ESRD on dialysis [facility])   Atrial fibrillation (HCC)   GIB (gastrointestinal bleeding)   PLAN:   --Patient need an EGD and colonoscopy for evaluation of anemia. Schedule for EGD. The risks and benefits of EGD and colonoscopy with possible biopsies / polypectomy were discussed with the patient who agrees to proceed.  --Keep on clear liquid only for possible EGD / colonoscopy tomorrow .  --Continue BID IV PPI --Monitor H/H and transfuse if needed.   HPI   Patient admitted yesterday from [place] for anemia..   Pt had labs at outside facility and found to have a hgb of 6.9, down from 10.7 in October. Patient tells me he hasn't seen any blood in his stool but last week his dialysis medication was changed and then he began having dark stools. He thinks that he  was started on oral iron last week? He has no GI symptoms. Specifically no abdominal pain, nausea/vomiting, or bowel changes.  He has never had a colonoscopy.  He denies NSAID use.   Notable labs / Imaging / Events this admission  :   Hemoglobin 7.5 on admission, stable overnight.  MCV 102.5 Platelets 140's-160 Alkaline phosphatase 145, albumin 2.7 INR 1.6 but on Eliquis FOBT positive  Previous GI Evaluations     Labs and Imaging: Recent Labs    07/17/23 1838 07/18/23 0416  WBC 8.8 9.0  HGB 7.5* 7.4*  HCT 24.3* 22.3*  PLT 160 147*   Recent Labs    07/17/23 1838 07/18/23 0416  NA 136 141  K 3.8 5.8*  CL 98 103  CO2 27 21*  GLUCOSE 113* 115*  BUN 36* 46*  CREATININE 4.53* 5.06*  CALCIUM 8.1* 7.5*   Recent Labs    07/17/23 1838  PROT 6.3*  ALBUMIN 2.7*  AST 33  ALT 44  ALKPHOS 145*  BILITOT 0.7   No results for input(s): "HEPBSAG", "HCVAB", "HEPAIGM", "HEPBIGM" in the last 72 hours. Recent Labs    07/17/23 1838  LABPROT 19.4*  INR 1.6*  Past Medical History:  Diagnosis Date   CHF (congestive heart failure) (HCC)    Chronic kidney disease    Diabetes mellitus without complication (HCC)    PATIENT JUST LEARNED HE WAS DIABETIC   Hepatitis    Hyperlipemia    Hypertension    Necrotizing fasciitis (HCC)    Pneumonia    HX OF PNA    Past Surgical History:  Procedure Laterality Date   AMPUTATION Left 09/11/2013   Procedure: AMPUTATION BELOW KNEE;  Surgeon: Cheral Almas, MD;  Location: [facility] OR;  Service: Orthopedics;  Laterality: Left;   AMPUTATION Right 12/18/2013   Procedure: RIGHT FOOT 1,2, TOE AMPUTATION  5th toe RAY AMPUTATION;  Surgeon: Cheral Almas, MD;  Location: [facility] OR;  Service: Orthopedics;  Laterality: Right;   AV FISTULA PLACEMENT Left 12/11/2019   Procedure: LEFT BRACHIOCEPHALIC FISTULA CREATION;  Surgeon: Larina Earthly, MD;  Location: [facility] OR;  Service: Vascular;  Laterality: Left;   CHOLECYSTECTOMY     I & D EXTREMITY Left  09/03/2013   Procedure: IRRIGATION AND DEBRIDEMENT EXTREMITY;  Surgeon: Cheral Almas, MD;  Location: [facility] OR;  Service: Orthopedics;  Laterality: Left;   I & D EXTREMITY Left 09/11/2013   Procedure: LEFT FOOT IRRIGATION AND DEBRIDEMENT;  Surgeon: Cheral Almas, MD;  Location: [facility] OR;  Service: Orthopedics;  Laterality: Left;   INSERTION OF DIALYSIS CATHETER  12/11/2019   Procedure: Insertion Of Dialysis Catheter;  Surgeon: Larina Earthly, MD;  Location: [facility] OR;  Service: Vascular;;   INSERTION OF DIALYSIS CATHETER Right 07/11/2022   Procedure: INSERTION OF TUNNELED DIALYSIS CATHETER;  Surgeon: Chuck Hint, MD;  Location: [facility] OR;  Service: Vascular;  Laterality: Right;   LEFT HEART CATH AND CORONARY ANGIOGRAPHY N/A 12/13/2019   Procedure: LEFT HEART CATH AND CORONARY ANGIOGRAPHY;  Surgeon: Corky Crafts, MD;  Location: [facility] INVASIVE CV LAB;  Service: Cardiovascular;  Laterality: N/A;   REVISON OF ARTERIOVENOUS FISTULA Left 07/11/2022   Procedure: REVISON OF LEFT ARM ARTERIOVENOUS FISTULA;  Surgeon: Chuck Hint, MD;  Location: [facility] OR;  Service: Vascular;  Laterality: Left;   RIGHT HEART CATH N/A 12/13/2019   Procedure: RIGHT HEART CATH;  Surgeon: Corky Crafts, MD;  Location: [facility] INVASIVE CV LAB;  Service: Cardiovascular;  Laterality: N/A;    Family History  Problem Relation Age of Onset   Diabetes type II Mother    Dementia Mother    Heart disease Father     Prior to Admission medications   Medication Sig Start Date End Date Taking? Authorizing Provider  amiodarone (PACERONE) 200 MG tablet Take 1 tablet (200 mg total) by mouth daily. 10/15/22 07/17/23 Yes McDiarmid, Leighton Roach, MD  apixaban (ELIQUIS) 5 MG TABS tablet Take 1 tablet (5 mg total) by mouth 2 (two) times daily. 10/06/22  Yes Ganta, Anupa, DO  atorvastatin (LIPITOR) 20 MG tablet Take 1 tablet (20 mg total) by mouth daily. 12/17/19  Yes Christian, Rylee, MD  bethanechol (URECHOLINE) 10 MG tablet Take 5 mg  by mouth 3 (three) times daily.   Yes [provider]  ferrous sulfate 325 (65 FE) MG tablet Take 325 mg by mouth daily with breakfast.   Yes [provider]  metoprolol succinate (TOPROL-XL) 25 MG 24 hr tablet Take 12.5 mg by mouth 3 (three) times a week. 12/20/22  Yes [provider]  PROMETHAZINE HCL IM Inject 25 mg into the muscle every 4 (four) hours as needed (Nausea and Vomiting).   Yes  [provider]  tamsulosin (FLOMAX) 0.4 MG CAPS capsule Take 0.4 mg by mouth See admin instructions. Take one tablet by mouth 3 times a week on Monday, Wednesday and Fridays 12/20/22  Yes [provider]    Current Facility-Administered Medications  Medication Dose Route Frequency Provider Last Rate Last Admin   acetaminophen (TYLENOL) tablet 650 mg  650 mg Oral Q6H PRN [name], DO       Or   acetaminophen (TYLENOL) suppository 650 mg  650 mg Rectal Q6H PRN [name], DO       amiodarone (PACERONE) tablet 200 mg  200 mg Oral Daily Julian Reil, Jared M, DO       atorvastatin (LIPITOR) tablet 20 mg  20 mg Oral Daily Julian Reil, Jared M, DO       bethanechol (URECHOLINE) tablet 5 mg  5 mg Oral TID [name], DO       Chlorhexidine Gluconate Cloth 2 % PADS 6 each  6 each Topical Q0600 Delano Metz, MD       ferrous sulfate tablet 325 mg  325 mg Oral Q breakfast [name], DO   325 mg at 07/18/23 0825   [START ON 07/19/2023] metoprolol succinate (TOPROL-XL) 24 hr tablet 12.5 mg  12.5 mg Oral Once per day on Monday Wednesday Friday [name], DO       ondansetron [facility]) tablet 4 mg  4 mg Oral Q6H PRN [name], DO       Or   ondansetron [facility]) injection 4 mg  4 mg Intravenous Q6H PRN [name], DO       Oral care mouth rinse  15 mL Mouth Rinse PRN [name], DO       pantoprazole (PROTONIX) injection 40 mg  40 mg Intravenous Q12H Lyda Perone M, DO       [START ON 07/19/2023] tamsulosin [facility]) capsule 0.4  mg  0.4 mg Oral Once per day on Monday Wednesday Friday [name], DO        Allergies as of 07/17/2023 - Review Complete 07/17/2023  Allergen Reaction Noted   Tramadol Nausea And Vomiting 09/20/2013    Social History   Socioeconomic History   Marital status: Single    Spouse name: Not on file   Number of children: Not on file   Years of education: Not on file   Highest education level: Not on file  Occupational History   Not on file  Tobacco Use   Smoking status: Never   Smokeless tobacco: Never  Vaping Use   Vaping status: Never Used  Substance and Sexual Activity   Alcohol use: No   Drug use: No   Sexual activity: Not on file  Other Topics Concern   Not on file  Social History Narrative   Lives in [place]   Social Drivers of Health   Financial Resource Strain: Not on file  Food Insecurity: No Food Insecurity (07/18/2023)   Hunger Vital Sign    Worried About Running Out of Food in the Last Year: Never true    Ran Out of Food in the Last Year: Never true  Transportation Needs: No Transportation Needs (07/18/2023)   PRAPARE - Transportation    Lack of Transportation (Medical): No    Lack of Transportation (Non-Medical): No  Physical Activity: Not on file  Stress: Not on file  Social Connections: Not on file  Intimate Partner Violence: Not At Risk (07/18/2023)   Humiliation,  Afraid, Rape, and Kick questionnaire    Fear of Current or Ex-Partner: No    Emotionally Abused: No    Physically Abused: No    Sexually Abused: No     Code Status   Code Status: Full Code  Review of Systems: All systems reviewed and negative except where noted in HPI.  Physical Exam: Vital signs in last 24 hours: Temp:  [97.4 F (36.3 C)-98 F (36.7 C)] 97.9 F (36.6 C) (12/17 0510) Pulse Rate:  [65-70] 70 (12/17 0510) Resp:  [14-29] 18 (12/17 0510) BP: (113-155)/(63-83) 145/63 (12/17 0510) SpO2:  [97 %-100 %] 98 % (12/17 0510) Last BM Date : 07/18/23  General:   Pleasant male in NAD Psych:  Cooperative. Normal mood and affect Eyes: Pupils equal Ears:  Normal auditory acuity Nose: No deformity, discharge or lesions Neck:  Supple, no masses felt Lungs:  Clear to auscultation.  Heart:  Regular rate.  Abdomen:  Soft, nondistended, nontender, active bowel sounds, no masses felt Rectal :  Deferred Msk: Symmetrical without gross deformities.  Neurologic:  Alert, oriented, grossly normal neurologically Skin:  Intact without significant lesions.    Intake/Output from previous day: 12/16 0701 - 12/17 0700 In: 120 [P.O.:120] Out: 0  Intake/Output this shift:  No intake/output data recorded.   Willette Cluster, NP-C   07/18/2023, 10:06 AM

## 2023-07-18 NOTE — Consult Note (Signed)
Renal Service Consult Note [facility]  [name] 07/18/2023 Ian Krabbe, MD Requesting Physician: [name]  Reason for Consult: ESRD pt w/ low Hb on SNF labs HPI: The patient is a 67 y.o. year-old w/ PMH as below who presented to ED sent by his SNF for a low Hb on labwork. In ED Hb was 7.5, compared to most recent Hb of 11. In ED per h&p stool was dark and hemoccult positive. Pt was started on protonix, clear liquid diet and GI to be consulted. Eliquis placed on hold (for afib). Pt was admitted. We are asked to see for dialysis.   Seen in room. Pt w/o c/o's.  Friend at bedside states he was in hospital 5 - 6 months ago, maybe for pna, and since then he really hasn't been doing well in terms of losing weigth and fatigue and is not able to get himself in and out of the [facility] like he used to, he is full assistance at this time.   ROS - denies CP, no joint pain, no HA, no blurry vision, no rash, no diarrhea, no nausea/ vomiting   Past Medical History  Past Medical History:  Diagnosis Date   CHF (congestive heart failure) (HCC)    Chronic kidney disease    Diabetes mellitus without complication (HCC)    PATIENT JUST LEARNED HE WAS DIABETIC   Hepatitis    Hyperlipemia    Hypertension    Necrotizing fasciitis (HCC)    Pneumonia    HX OF PNA   Past Surgical History  Past Surgical History:  Procedure Laterality Date   AMPUTATION Left 09/11/2013   Procedure: AMPUTATION BELOW KNEE;  Surgeon: Cheral Almas, MD;  Location: [facility] OR;  Service: Orthopedics;  Laterality: Left;   AMPUTATION Right 12/18/2013   Procedure: RIGHT FOOT 1,2, TOE AMPUTATION  5th toe RAY AMPUTATION;  Surgeon: Cheral Almas, MD;  Location: [facility] OR;  Service: Orthopedics;  Laterality: Right;   AV FISTULA PLACEMENT Left 12/11/2019   Procedure: LEFT BRACHIOCEPHALIC FISTULA CREATION;  Surgeon: Larina Earthly, MD;  Location: [facility] OR;  Service: Vascular;  Laterality: Left;   CHOLECYSTECTOMY     I & D  EXTREMITY Left 09/03/2013   Procedure: IRRIGATION AND DEBRIDEMENT EXTREMITY;  Surgeon: Cheral Almas, MD;  Location: [facility] OR;  Service: Orthopedics;  Laterality: Left;   I & D EXTREMITY Left 09/11/2013   Procedure: LEFT FOOT IRRIGATION AND DEBRIDEMENT;  Surgeon: Cheral Almas, MD;  Location: [facility] OR;  Service: Orthopedics;  Laterality: Left;   INSERTION OF DIALYSIS CATHETER  12/11/2019   Procedure: Insertion Of Dialysis Catheter;  Surgeon: Larina Earthly, MD;  Location: [facility] OR;  Service: Vascular;;   INSERTION OF DIALYSIS CATHETER Right 07/11/2022   Procedure: INSERTION OF TUNNELED DIALYSIS CATHETER;  Surgeon: Chuck Hint, MD;  Location: [facility] OR;  Service: Vascular;  Laterality: Right;   LEFT HEART CATH AND CORONARY ANGIOGRAPHY N/A 12/13/2019   Procedure: LEFT HEART CATH AND CORONARY ANGIOGRAPHY;  Surgeon: Corky Crafts, MD;  Location: [facility] INVASIVE CV LAB;  Service: Cardiovascular;  Laterality: N/A;   REVISON OF ARTERIOVENOUS FISTULA Left 07/11/2022   Procedure: REVISON OF LEFT ARM ARTERIOVENOUS FISTULA;  Surgeon: Chuck Hint, MD;  Location: [facility] OR;  Service: Vascular;  Laterality: Left;   RIGHT HEART CATH N/A 12/13/2019   Procedure: RIGHT HEART CATH;  Surgeon: Corky Crafts, MD;  Location: [facility] INVASIVE CV LAB;  Service: Cardiovascular;  Laterality: N/A;   Family  History  Family History  Problem Relation Age of Onset   Diabetes type II Mother    Dementia Mother    Heart disease Father    Social History  reports that he has never smoked. He has never used smokeless tobacco. He reports that he does not drink alcohol and does not use drugs. Allergies  Allergies  Allergen Reactions   Tramadol Nausea And Vomiting    Pt states he took this on an empty stomach one time and it came right back up. He's not had it since.    Home medications Prior to Admission medications   Medication Sig Start Date End Date Taking? Authorizing Provider  amiodarone (PACERONE) 200 MG  tablet Take 1 tablet (200 mg total) by mouth daily. 10/15/22 07/17/23 Yes McDiarmid, Leighton Roach, MD  apixaban (ELIQUIS) 5 MG TABS tablet Take 1 tablet (5 mg total) by mouth 2 (two) times daily. 10/06/22  Yes Ganta, Anupa, DO  atorvastatin (LIPITOR) 20 MG tablet Take 1 tablet (20 mg total) by mouth daily. 12/17/19  Yes Christian, Rylee, MD  bethanechol (URECHOLINE) 10 MG tablet Take 5 mg by mouth 3 (three) times daily.   Yes [provider]  ferrous sulfate 325 (65 FE) MG tablet Take 325 mg by mouth daily with breakfast.   Yes [provider]  metoprolol succinate (TOPROL-XL) 25 MG 24 hr tablet Take 12.5 mg by mouth 3 (three) times a week. 12/20/22  Yes [provider]  PROMETHAZINE HCL IM Inject 25 mg into the muscle every 4 (four) hours as needed (Nausea and Vomiting).   Yes [provider]  tamsulosin (FLOMAX) 0.4 MG CAPS capsule Take 0.4 mg by mouth See admin instructions. Take one tablet by mouth 3 times a week on Monday, Wednesday and Fridays 12/20/22  Yes [provider]     Vitals:   07/17/23 2236 07/17/23 2300 07/18/23 0021 07/18/23 0510  BP:  (!) 155/70 (!) 146/71 (!) 145/63  Pulse:  69 68 70  Resp:  (!) 25 18 18   Temp: 98 F (36.7 C)  97.9 F (36.6 C) 97.9 F (36.6 C)  TempSrc: Oral  Oral   SpO2:  100% 100% 98%   Exam Gen alert, no distress, older than stated age No rash, cyanosis or gangrene Sclera anicteric, throat clear  No jvd or bruits Chest clear bilat to bases, no rales/ wheezing RRR no MRG Abd soft ntnd no mass or ascites +bs GU no male MS L BKA stump, no edema  R foot w/ toe amps, no RLE edema Neuro is alert, Ox 3 , nf     LUA AVF+bruit        Renal-related home meds: - eliquis 5 bid - toprol xl 12.5 three times per week    OP HD: TTS G-O  3h  400/700    51kg  2/2.5 bath  AVF   Heparin 2000 - last HD was 12/14, post wt 49.3kg - getting to dry wt, under by 1.7 kg last hd - rocaltrol 0.25 mcg - mircera 30 q 2,  last 12/07, due 12/21    K+  5.8   Assessment/ Plan: Acute blood loss anemia - Hb down 7.5 from 11 last month. [place] +in ED. Per pmd / GI.  Hyperkalemia - K+ 5.8 today, was 3.8 yesterday. Will change to renal diet.  ESKD - on HD TTS. Has not missed HD. HD today.  HTN - bp's stable, cont home BB.  Volume - no vol excess  on exam Anemia of eskd - Hb 7- 8 range, tranfuse prn. Next esa due 12/21.  MBD ckd - CCa in range, add on phos.      Vinson Moselle  MD [facility] 07/18/2023, 9:23 AM  Recent Labs  Lab 07/17/23 1838 07/18/23 0416  HGB 7.5* 7.4*  ALBUMIN 2.7*  --   CALCIUM 8.1* 7.5*  CREATININE 4.53* 5.06*  K 3.8 5.8*   Inpatient medications:  amiodarone  200 mg Oral Daily   atorvastatin  20 mg Oral Daily   bethanechol  5 mg Oral TID   ferrous sulfate  325 mg Oral Q breakfast   [START ON 07/19/2023] metoprolol succinate  12.5 mg Oral Once per day on Monday Wednesday Friday   pantoprazole (PROTONIX) IV  40 mg Intravenous Q12H   [START ON 07/19/2023] tamsulosin  0.4 mg Oral Once per day on Monday Wednesday Friday    acetaminophen **OR** acetaminophen, ondansetron **OR** ondansetron (ZOFRAN) IV, mouth rinse

## 2023-07-19 ENCOUNTER — Encounter (HOSPITAL_COMMUNITY)
Admission: EM | Disposition: A | Discharge status: Skilled Nursing Facility | Payer: Self-pay | Source: Skilled Nursing Facility | Attending: Family Medicine

## 2023-07-19 ENCOUNTER — Encounter (HOSPITAL_COMMUNITY): Payer: Self-pay | Admitting: Family Medicine

## 2023-07-19 ENCOUNTER — Inpatient Hospital Stay (HOSPITAL_COMMUNITY): Payer: Medicare Other | Admitting: Registered Nurse

## 2023-07-19 DIAGNOSIS — K2971 Gastritis, unspecified, with bleeding: Secondary | ICD-10-CM | POA: Diagnosis not present

## 2023-07-19 DIAGNOSIS — K2901 Acute gastritis with bleeding: Secondary | ICD-10-CM

## 2023-07-19 DIAGNOSIS — R195 Other fecal abnormalities: Secondary | ICD-10-CM

## 2023-07-19 DIAGNOSIS — K31811 Angiodysplasia of stomach and duodenum with bleeding: Secondary | ICD-10-CM | POA: Diagnosis not present

## 2023-07-19 DIAGNOSIS — K2960 Other gastritis without bleeding: Secondary | ICD-10-CM

## 2023-07-19 DIAGNOSIS — D62 Acute posthemorrhagic anemia: Secondary | ICD-10-CM | POA: Diagnosis not present

## 2023-07-19 HISTORY — PX: BIOPSY: SHX5522

## 2023-07-19 HISTORY — PX: ESOPHAGOGASTRODUODENOSCOPY (EGD) WITH PROPOFOL: SHX5813

## 2023-07-19 HISTORY — PX: HOT HEMOSTASIS: SHX5433

## 2023-07-19 LAB — RENAL FUNCTION PANEL
Albumin: 2.5 g/dL — ABNORMAL LOW (ref 3.5–5.0)
Anion gap: 10 (ref 5–15)
BUN: 18 mg/dL (ref 8–23)
CO2: 29 mmol/L (ref 22–32)
Calcium: 8 mg/dL — ABNORMAL LOW (ref 8.9–10.3)
Chloride: 95 mmol/L — ABNORMAL LOW (ref 98–111)
Creatinine, Ser: 2.96 mg/dL — ABNORMAL HIGH (ref 0.61–1.24)
GFR, Estimated: 23 mL/min — ABNORMAL LOW (ref >60)
Glucose, Bld: 96 mg/dL (ref 70–99)
Phosphorus: 2.9 mg/dL (ref 2.5–4.6)
Potassium: 3.3 mmol/L — ABNORMAL LOW (ref 3.5–5.1)
Sodium: 134 mmol/L — ABNORMAL LOW (ref 135–145)

## 2023-07-19 LAB — CBC
HCT: 22.6 % — ABNORMAL LOW (ref 39.0–52.0)
Hemoglobin: 7.2 g/dL — ABNORMAL LOW (ref 13.0–17.0)
MCH: 31.2 pg (ref 26.0–34.0)
MCHC: 31.9 g/dL (ref 30.0–36.0)
MCV: 97.8 fL (ref 80.0–100.0)
Platelets: 145 10*3/uL — ABNORMAL LOW (ref 150–400)
RBC: 2.31 MIL/uL — ABNORMAL LOW (ref 4.22–5.81)
RDW: 17 % — ABNORMAL HIGH (ref 11.5–15.5)
WBC: 8.1 10*3/uL (ref 4.0–10.5)
nRBC: 0 % (ref 0.0–0.2)

## 2023-07-19 LAB — HEPATITIS B SURFACE ANTIBODY, QUANTITATIVE: Hep B S AB Quant (Post): <3.5 m[IU]/mL — ABNORMAL LOW

## 2023-07-19 SURGERY — ESOPHAGOGASTRODUODENOSCOPY (EGD) WITH PROPOFOL
Anesthesia: Monitor Anesthesia Care

## 2023-07-19 MED ORDER — LIDOCAINE 2% (20 MG/ML) 5 ML SYRINGE
INTRAMUSCULAR | Status: DC | PRN
  Administered 2023-07-19: 60 mg via INTRAVENOUS

## 2023-07-19 MED ORDER — PHENYLEPHRINE HCL (PRESSORS) 10 MG/ML IV SOLN
INTRAVENOUS | Status: DC | PRN
  Administered 2023-07-19: 80 ug via INTRAVENOUS

## 2023-07-19 MED ORDER — LIDOCAINE HCL (PF) 1 % IJ SOLN: 5.0000 mL | INTRAMUSCULAR | Status: DC | PRN

## 2023-07-19 MED ORDER — PENTAFLUOROPROP-TETRAFLUOROETH EX AERO: 1.0000 | INHALATION_SPRAY | CUTANEOUS | Status: DC | PRN

## 2023-07-19 MED ORDER — LIDOCAINE-PRILOCAINE 2.5-2.5 % EX CREA: 1.0000 | TOPICAL_CREAM | CUTANEOUS | Status: DC | PRN

## 2023-07-19 MED ORDER — HEPARIN SODIUM (PORCINE) 1000 UNIT/ML DIALYSIS: 1000.0000 [IU] | INTRAMUSCULAR | Status: DC | PRN

## 2023-07-19 MED ORDER — GLYCOPYRROLATE PF 0.2 MG/ML IJ SOSY
PREFILLED_SYRINGE | INTRAMUSCULAR | Status: DC | PRN
  Administered 2023-07-19: .1 mg via INTRAVENOUS

## 2023-07-19 MED ORDER — PANTOPRAZOLE SODIUM 40 MG PO TBEC
40.0000 mg | DELAYED_RELEASE_TABLET | Freq: Two times a day (BID) | ORAL | Status: DC
  Administered 2023-07-20 – 2023-07-21 (×4): 40 mg via ORAL
  Filled 2023-07-19 (×5): qty 1

## 2023-07-19 MED ORDER — PROPOFOL 10 MG/ML IV BOLUS
INTRAVENOUS | Status: DC | PRN
  Administered 2023-07-19: 40 mg via INTRAVENOUS
  Administered 2023-07-19: 125 ug/kg/min via INTRAVENOUS

## 2023-07-19 MED ORDER — CALCITRIOL 0.25 MCG PO CAPS
0.2500 ug | ORAL_CAPSULE | ORAL | Status: DC
  Administered 2023-07-20: 0.25 ug via ORAL
  Filled 2023-07-19: qty 1

## 2023-07-19 MED ORDER — SODIUM CHLORIDE 0.9 % IV SOLN: INTRAVENOUS | Status: DC | PRN

## 2023-07-19 SURGICAL SUPPLY — 24 items
BLOCK BITE 60FR ADLT L/F BLUE (MISCELLANEOUS) ×2 IMPLANT
ELECT REM PT RETURN 9FT ADLT (ELECTROSURGICAL) IMPLANT
ELECTRODE REM PT RTRN 9FT ADLT (ELECTROSURGICAL) IMPLANT
FCP BXJMBJMB 240X2.8X (CUTTING FORCEPS)
FLOOR PAD 36X40 (MISCELLANEOUS) ×2 IMPLANT
FORCEP RJ3 GP 1.8X160 W-NEEDLE (CUTTING FORCEPS) IMPLANT
FORCEPS BIOP RAD 4 LRG CAP 4 (CUTTING FORCEPS) IMPLANT
FORCEPS BIOP RJ4 240 W/NDL (CUTTING FORCEPS) IMPLANT
FORCEPS BXJMBJMB 240X2.8X (CUTTING FORCEPS) IMPLANT
INJECTOR/SNARE I SNARE (MISCELLANEOUS) IMPLANT
LUBRICANT JELLY 4.5OZ STERILE (MISCELLANEOUS) IMPLANT
MANIFOLD NEPTUNE II (INSTRUMENTS) IMPLANT
NDL SCLEROTHERAPY 25GX240 (NEEDLE) IMPLANT
NEEDLE SCLEROTHERAPY 25GX240 (NEEDLE) IMPLANT
PAD FLOOR 36X40 (MISCELLANEOUS) ×2 IMPLANT
PROBE APC STR FIRE (PROBE) IMPLANT
PROBE INJECTION GOLD 7FR (MISCELLANEOUS) IMPLANT
SNARE ROTATE MED OVAL 20MM (MISCELLANEOUS) IMPLANT
SNARE SHORT THROW 13M SML OVAL (MISCELLANEOUS) IMPLANT
SYR 50ML LL SCALE MARK (SYRINGE) IMPLANT
TRAP SPECIMEN MUCOUS 40CC (MISCELLANEOUS) IMPLANT
TUBING ENDO SMARTCAP PENTAX (MISCELLANEOUS) ×4 IMPLANT
TUBING IRRIGATION ENDOGATOR (MISCELLANEOUS) ×2 IMPLANT
WATER STERILE IRR 1000ML POUR (IV SOLUTION) IMPLANT

## 2023-07-19 NOTE — Plan of Care (Signed)
  Problem: Education: Goal: Knowledge of General Education information will improve Description: Including pain rating scale, medication(s)/side effects and non-pharmacologic comfort measures Outcome: Progressing   Problem: Health Behavior/Discharge Planning: Goal: Ability to manage health-related needs will improve Outcome: Progressing   Problem: Clinical Measurements: Goal: Ability to maintain clinical measurements within normal limits will improve Outcome: Progressing Goal: Will remain free from infection Outcome: Progressing Goal: Diagnostic test results will improve Outcome: Progressing Goal: Respiratory complications will improve Outcome: Progressing Goal: Cardiovascular complication will be avoided Outcome: Progressing   Problem: Activity: Goal: Risk for activity intolerance will decrease Outcome: Progressing   Problem: Nutrition: Goal: Adequate nutrition will be maintained Outcome: Progressing   Problem: Coping: Goal: Level of anxiety will decrease Outcome: Progressing   Problem: Elimination: Goal: Will not experience complications related to bowel motility Outcome: Progressing Goal: Will not experience complications related to urinary retention Outcome: Progressing   Problem: Pain Management: Goal: General experience of comfort will improve Outcome: Progressing   Problem: Safety: Goal: Ability to remain free from injury will improve Outcome: Progressing   Problem: Skin Integrity: Goal: Risk for impaired skin integrity will decrease Outcome: Progressing   Problem: Education: Goal: Ability to identify signs and symptoms of gastrointestinal bleeding will improve Outcome: Progressing   Problem: Bowel/Gastric: Goal: Will show no signs and symptoms of gastrointestinal bleeding Outcome: Progressing   Problem: Fluid Volume: Goal: Will show no signs and symptoms of excessive bleeding Outcome: Progressing   Problem: Clinical Measurements: Goal:  Complications related to the disease process, condition or treatment will be avoided or minimized Outcome: Progressing   Problem: Education: Goal: Knowledge of disease and its progression will improve Outcome: Progressing Goal: Individualized Educational Video(s) Outcome: Progressing   Problem: Fluid Volume: Goal: Compliance with measures to maintain balanced fluid volume will improve Outcome: Progressing   Problem: Health Behavior/Discharge Planning: Goal: Ability to manage health-related needs will improve Outcome: Progressing   Problem: Nutritional: Goal: Ability to make healthy dietary choices will improve Outcome: Progressing   Problem: Clinical Measurements: Goal: Complications related to the disease process, condition or treatment will be avoided or minimized Outcome: Progressing

## 2023-07-19 NOTE — Plan of Care (Signed)

## 2023-07-19 NOTE — Interval H&P Note (Signed)
History and Physical Interval Note: Plan yesterday was for upper endoscopy and colonoscopy to evaluate acute on chronic low iron anemia in the setting of heme positive stool Patient now not willing for colonoscopy and did not drink the bowel preparation I explained to him again today the reason behind colonoscopy recommendation and he defers this test Agreeable to upper endoscopy Eliquis has been on hold  The nature of the procedure, as well as the risks, benefits, and alternatives were carefully and thoroughly reviewed with the patient. Ample time for discussion and questions allowed. The patient understood, was satisfied, and agreed to proceed.      Latest Ref Rng & Units 07/19/2023    4:57 AM 07/18/2023    4:16 AM 07/17/2023    6:38 PM  CBC  WBC 4.0 - 10.5 K/uL 8.1  9.0  8.8   Hemoglobin 13.0 - 17.0 g/dL 7.2  7.4  7.5   Hematocrit 39.0 - 52.0 % 22.6  22.3  24.3   Platelets 150 - 400 K/uL 145  147  160      07/19/2023 11:37 AM  [name]  has presented today for surgery, with the diagnosis of Anemia.  The various methods of treatment have been discussed with the patient and family. After consideration of risks, benefits and other options for treatment, the patient has consented to  Procedure(s): ESOPHAGOGASTRODUODENOSCOPY (EGD) WITH PROPOFOL (N/A) as a surgical intervention.  The patient's history has been reviewed, patient examined, no change in status, stable for surgery.  I have reviewed the patient's chart and labs.  Questions were answered to the patient's satisfaction.     Carie Caddy [name]

## 2023-07-19 NOTE — Progress Notes (Signed)
[facility] PROGRESS NOTE  [name] (DOB: June 27, 1956) UUV:[id number] PCP: Patient, No Pcp Per  Brief Narrative: [name] is a 67 y.o. male with a history of ESRD (HD TTS), left BKA, AFib on eliquis, HFrecoveredEF, T2DM who was sent to the ED on 07/17/2023 due to low hemoglobin found on outpatient labs. Hgb was reportedly 6.9g/dl down from recent baseline of 10-11. He has dark stool in the setting of taking oral iron, which was +FOBT in the ED. He had no specific complaints. Hgb here has been 7.5 > 7.4 > 7.2. EGD/colonoscopy is recommended by GI to be performed 12/18.  Subjective: Wants to be "knocked out" for procedures. Doesn't have a particular issue with taking bowel prep, but didn't take it last night. He denies any new physical concerns.  Objective: BP (!) 144/68 (BP Location: Right Arm)   Pulse 75   Temp 98.4 F (36.9 C) (Oral)   Resp 16   Wt 53.7 kg Comment: bed  SpO2 100%   BMI 21.65 kg/m   Gen: 67yo M in no distress Pulm: Nonlabored, clear CV: RRR GI: +BS, soft, NT, ND Neuro: Alert, oriented, no focal deficits on limited exam Ext: Warm, dry, stable amputation sites LE's Skin: No bleeding wounds noted. Diffusely pale.  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 GI consulted, will pursue EGD/colonoscopy (plan is for 12/18, though defer to them regarding the plan since patient has anesthesia concerns).  - Continue holding DOAC (last dose 12/15).  - Confirmed pt would consent to transfusion if threshold of 7g/dl is met. Level of 7.2g/dl with his hemodynamic stability and no symptoms does not meet criteria strictly speaking.  ESRD:  - Routine HD per nephrology while here. Tx 12/17 on schedule.  - Next ESA due 12/21.   Hyperkalemia: Given lokelma, changed to renal diet, dialysis 12/17.  - Repeat RFP still pending this AM. Stay on telemetry.  PAF:  - Continue metoprolol, amiodarone, hold eliquis.    T2DM: No home meds for this. Last HbA1c was 5.6%.   HLD:  - Continue statin  Thrombocytopenia: Stable.   Tyrone Nine, MD [facility] [email or web address] 07/19/2023, 7:48 AM

## 2023-07-19 NOTE — Progress Notes (Signed)
Pt receives out-pt HD at [facility] on TTS. Contacted by pt's out-pt HD clinic social worker to say that pt was in the process of being transferred from [place] [facility] to [facility]. This info was provided to [facility] CSW per clinic social worker request so hospital staff aware of plans prior to admission and to confirm plans when pt is stable for d/c. Will assist as needed.   Olivia Canter Renal Navigator [phone]

## 2023-07-19 NOTE — Anesthesia Preprocedure Evaluation (Signed)
Anesthesia Evaluation  Patient identified by MRN, date of birth, ID band Patient awake    Reviewed: Allergy & Precautions, NPO status , Patient's Chart, lab work & pertinent test results, reviewed documented beta blocker date and time   History of Anesthesia Complications Negative for: history of anesthetic complications  Airway Mallampati: III  TM Distance: >3 FB     Dental  (+) Missing, Poor Dentition   Pulmonary neg shortness of breath, pneumonia, resolved, neg PE   breath sounds clear to auscultation       Cardiovascular hypertension, + CAD, + Past MI, + Peripheral Vascular Disease and +CHF  (-) Cardiac Stents, (-) CABG and (-) DVT  Rhythm:Regular Rate:Normal     Neuro/Psych neg Seizures  Neuromuscular disease    GI/Hepatic ,,,(+) Hepatitis -  Endo/Other  diabetes, Type 2    Renal/GU ESRF and DialysisRenal disease     Musculoskeletal   Abdominal   Peds  Hematology  (+) Blood dyscrasia, anemia   Anesthesia Other Findings   Reproductive/Obstetrics                              Anesthesia Physical Anesthesia Plan  ASA: 3  Anesthesia Plan: MAC   Post-op Pain Management:    Induction:   PONV Risk Score and Plan: 1 and Ondansetron  Airway Management Planned:   Additional Equipment:   Intra-op Plan:   Post-operative Plan:   Informed Consent: I have reviewed the patients History and Physical, chart, labs and discussed the procedure including the risks, benefits and alternatives for the proposed anesthesia with the patient or authorized representative who has indicated his/her understanding and acceptance.     Dental advisory given  Plan Discussed with: CRNA  Anesthesia Plan Comments:          Anesthesia Quick Evaluation

## 2023-07-19 NOTE — Anesthesia Postprocedure Evaluation (Signed)
Anesthesia Post Note  Patient: [name]  Procedure(s) Performed: ESOPHAGOGASTRODUODENOSCOPY (EGD) WITH PROPOFOL BIOPSY HOT HEMOSTASIS (ARGON PLASMA COAGULATION/BICAP)     Patient location during evaluation: PACU Anesthesia Type: MAC Level of consciousness: awake and alert Pain management: pain level controlled Vital Signs Assessment: post-procedure vital signs reviewed and stable Respiratory status: spontaneous breathing, nonlabored ventilation, respiratory function stable and patient connected to nasal cannula oxygen Cardiovascular status: stable and blood pressure returned to baseline Postop Assessment: no apparent nausea or vomiting Anesthetic complications: no   There were no known notable events for this encounter.  Last Vitals:  Vitals:   07/19/23 1235 07/19/23 1240  BP: 105/79 (!) 108/93  Pulse: 88 88  Resp: 15 (!) 27  Temp: 36.5 C   SpO2: 100% 100%    Last Pain:  Vitals:   07/19/23 1250  TempSrc:   PainSc: 0-No pain                 Mariann Barter

## 2023-07-19 NOTE — Progress Notes (Signed)
  [facility] Progress Note   Subjective:  Seen in room. No CP/dyspnea. Bowel prep not done per notes. Dialyzed last night - no fluid removed.  Objective Vitals:   07/18/23 1935 07/18/23 2042 07/19/23 0632 07/19/23 0713  BP:  129/79 124/62 (!) 144/68  Pulse:  77 74 75  Resp:   20 16  Temp:  98.3 F (36.8 C) 98.5 F (36.9 C) 98.4 F (36.9 C)  TempSrc:   Oral Oral  SpO2:  100% 100% 100%  Weight: 53.7 kg      Physical Exam General: Well appearing man, NAD. Room air. Heart: RRR; no murmur Lungs: CTA anteriorly Abdomen: soft, non-tender Extremities: no RLE edema, L BKA Dialysis Access: LUE AVF +t/b  Additional Objective Labs: Basic Metabolic Panel: Recent Labs  Lab 07/17/23 1838 07/18/23 0416 07/18/23 1006 07/19/23 0458  NA 136 141  --  134*  K 3.8 5.8*  --  3.3*  CL 98 103  --  95*  CO2 27 21*  --  29  GLUCOSE 113* 115*  --  96  BUN 36* 46*  --  18  CREATININE 4.53* 5.06*  --  2.96*  CALCIUM 8.1* 7.5*  --  8.0*  PHOS  --   --  3.3 2.9   Liver Function Tests: Recent Labs  Lab 07/17/23 1838 07/19/23 0458  AST 33  --   ALT 44  --   ALKPHOS 145*  --   BILITOT 0.7  --   PROT 6.3*  --   ALBUMIN 2.7* 2.5*   CBC: Recent Labs  Lab 07/17/23 1838 07/18/23 0416 07/19/23 0457  WBC 8.8 9.0 8.1  NEUTROABS 6.6  --   --   HGB 7.5* 7.4* 7.2*  HCT 24.3* 22.3* 22.6*  MCV 102.5* 95.7 97.8  PLT 160 147* 145*   Medications:   amiodarone  200 mg Oral Daily   atorvastatin  20 mg Oral Daily   bethanechol  5 mg Oral TID   Chlorhexidine Gluconate Cloth  6 each Topical Q0600   ferrous sulfate  325 mg Oral Q breakfast   metoprolol succinate  12.5 mg Oral Once per day on Monday Wednesday Friday   pantoprazole (PROTONIX) IV  40 mg Intravenous Q12H   peg 3350 powder  0.5 kit Oral Once   And   peg 3350 powder  0.5 kit Oral Once   tamsulosin  0.4 mg Oral Once per day on Monday Wednesday Friday   Dialysis Orders: TTS - GOC 3:45hr, 400/700, EDW  51kg,  2K/2.5Ca bath, AVF, heparin 2000 unit bolus - Under EDW recently - last HD 12/14, post-HD weight 49.3kg - Calcitriol 0.20mcg PO q HD - Mircera IV q 2 weeks - last 12/7, due 12/21  Assessment/Plan: ABLA/suspected GIB: Hgb 11 -> 7.5. FOBT + in ED. GI consulted, plan for scope but bowel prep not completed. Defer to GI team for rescheduling. Hyperkalemia: On admit, better with HD. K 3.3 this AM, would not supplement ESRD: Continue HD on TTS schedule - next tomorrow. HTN/volume: Losing weight recently. BP good, no edema. Low UF goal next HD. Anemia of ESRD: See #1. Not due for ESA yet. Secondary HPTH: CorrCa/Phos good. Resume VDRA, no binders for now. Nutrition: Alb low, will add supplements once on full diet. A-fib: On amiodarone + Eliquis (which is on hold).   Ozzie Hoyle, PA-C 07/19/2023, 10:26 AM  [employer]

## 2023-07-19 NOTE — Transfer of Care (Signed)
Immediate Anesthesia Transfer of Care Note  Patient: [name]  Procedure(s) Performed: ESOPHAGOGASTRODUODENOSCOPY (EGD) WITH PROPOFOL BIOPSY HOT HEMOSTASIS (ARGON PLASMA COAGULATION/BICAP)  Patient Location: Endoscopy Unit  Anesthesia Type:MAC  Level of Consciousness: alert  and oriented  Airway & Oxygen Therapy: Patient Spontanous Breathing and Patient connected to nasal cannula oxygen  Post-op Assessment: Report given to RN and Post -op Vital signs reviewed and stable  Post vital signs: Reviewed and stable  Last Vitals:  Vitals Value Taken Time  BP 105/79   Temp    Pulse 94   Resp 9   SpO2 100     Last Pain:  Vitals:   07/19/23 1050  TempSrc: Temporal  PainSc: 0-No pain         Complications: There were no known notable events for this encounter.

## 2023-07-19 NOTE — Op Note (Signed)
[facility] Patient Name: [name] Procedure Date : 07/19/2023 MRN: [id number] Attending MD: Beverley Fiedler , MD, [id number] Date of Birth: [birth year] CSN: [id number] Age: 67 Admit Type: Inpatient Procedure:                Upper GI endoscopy Indications:              Iron deficiency anemia, Heme positive stool Providers:                Carie Caddy. Rhea Belton, MD, Dartha Lodge, RN, Glory Rosebush,                            RN, Kandice Robinsons, Technician Referring MD:             [facility] Medicines:                Monitored Anesthesia Care Complications:            No immediate complications. Estimated Blood Loss:     Estimated blood loss was minimal. Procedure:                Pre-Anesthesia Assessment:                           - Prior to the procedure, a History and Physical                            was performed, and patient medications and                            allergies were reviewed. The patient's tolerance of                            previous anesthesia was also reviewed. The risks                            and benefits of the procedure and the sedation                            options and risks were discussed with the patient.                            All questions were answered, and informed consent                            was obtained. Prior Anticoagulants: The patient has                            taken Eliquis (apixaban), last dose was 2 days                            prior to procedure. ASA Grade Assessment: III - A                            patient with severe systemic disease. After  reviewing the risks and benefits, the patient was                            deemed in satisfactory condition to undergo the                            procedure.                           After obtaining informed consent, the endoscope was                            passed under direct vision. Throughout the                             procedure, the patient's blood pressure, pulse, and                            oxygen saturations were monitored continuously. The                            GIF-H190 ([id number]) Olympus endoscope was introduced                            through the mouth, and advanced to the second part                            of duodenum. The upper GI endoscopy was                            accomplished without difficulty. The patient                            tolerated the procedure well. Scope In: Scope Out: Findings:      The examined esophagus was normal.      A few small angioectasias with bleeding were found in the gastric       fundus. Fulguration to ablate the lesion by argon plasma at 1       liter/minute and 20 watts was unsuccessful.      Moderate inflammation with hemorrhage characterized by congestion       (edema), erythema, friability and granularity was found in the cardia       and in the gastric fundus. Biopsies were taken with a cold forceps for       Helicobacter pylori testing.      The examined duodenum was normal. Biopsies for histology were taken with       a cold forceps for evaluation of celiac disease. Impression:               - Normal esophagus.                           - A few bleeding angioectasias in the stomach.                            Treatment not successful. Treated with  argon plasma                            coagulation (APC).                           - Gastritis with hemorrhage. Treated with APC and                            biopsied.                           - Normal examined duodenum. Biopsied. Moderate Sedation:      N/A Recommendation:           - Return patient to hospital ward for ongoing care.                           - Advance diet as tolerated.                           - Continue present medications. BID PPI is                            recommended. Hold Eliquis x 48 hrs after APC                            ablation.                            - Await pathology results.                           - Colonoscopy recommended, but patient does not                            wish to proceed.                           - Monitor Hgb and replace iron as needed.                           - GI will sig off, call if questions. Procedure Code(s):        --- Professional ---                           43255, 59, Esophagogastroduodenoscopy, flexible,                            transoral; with control of bleeding, any method                           43239, Esophagogastroduodenoscopy, flexible,                            transoral; with biopsy, single or multiple Diagnosis Code(s):        --- Professional ---  K31.811, Angiodysplasia of stomach and duodenum                            with bleeding                           K29.71, Gastritis, unspecified, with bleeding                           D50.9, Iron deficiency anemia, unspecified                           R19.5, Other fecal abnormalities CPT copyright 2022 [employer] Association. All rights reserved. The codes documented in this report are preliminary and upon coder review may  be revised to meet current compliance requirements. Beverley Fiedler, MD 07/19/2023 12:29:59 PM This report has been signed electronically. Number of Addenda: 0

## 2023-07-20 DIAGNOSIS — D62 Acute posthemorrhagic anemia: Secondary | ICD-10-CM | POA: Diagnosis not present

## 2023-07-20 LAB — RENAL FUNCTION PANEL
Albumin: 2.5 g/dL — ABNORMAL LOW (ref 3.5–5.0)
Anion gap: 12 (ref 5–15)
BUN: 28 mg/dL — ABNORMAL HIGH (ref 8–23)
CO2: 27 mmol/L (ref 22–32)
Calcium: 8.2 mg/dL — ABNORMAL LOW (ref 8.9–10.3)
Chloride: 97 mmol/L — ABNORMAL LOW (ref 98–111)
Creatinine, Ser: 4.21 mg/dL — ABNORMAL HIGH (ref 0.61–1.24)
GFR, Estimated: 15 mL/min — ABNORMAL LOW (ref >60)
Glucose, Bld: 74 mg/dL (ref 70–99)
Phosphorus: 4.8 mg/dL — ABNORMAL HIGH (ref 2.5–4.6)
Potassium: 3.5 mmol/L (ref 3.5–5.1)
Sodium: 136 mmol/L (ref 135–145)

## 2023-07-20 LAB — CBC
HCT: 22.5 % — ABNORMAL LOW (ref 39.0–52.0)
Hemoglobin: 7.3 g/dL — ABNORMAL LOW (ref 13.0–17.0)
MCH: 31.7 pg (ref 26.0–34.0)
MCHC: 32.4 g/dL (ref 30.0–36.0)
MCV: 97.8 fL (ref 80.0–100.0)
Platelets: 140 10*3/uL — ABNORMAL LOW (ref 150–400)
RBC: 2.3 MIL/uL — ABNORMAL LOW (ref 4.22–5.81)
RDW: 16.8 % — ABNORMAL HIGH (ref 11.5–15.5)
WBC: 8.7 10*3/uL (ref 4.0–10.5)
nRBC: 0 % (ref 0.0–0.2)

## 2023-07-20 NOTE — Progress Notes (Signed)
[facility] Progress Note   Subjective:   Seen in room - feels ok today. No CP/dyspnea or abdominal pain. S/p EGD yesterday - found to have bleeding gastric AVMs, not fully treated per notes.  Objective Vitals:   07/19/23 1556 07/19/23 2155 07/20/23 0619 07/20/23 0858  BP: 138/80 (!) 157/79 (!) 101/41 (!) 154/73  Pulse: 83 87 72 77  Resp: 18   19  Temp: 98 F (36.7 C) 98.4 F (36.9 C) 98.4 F (36.9 C) 98.5 F (36.9 C)  TempSrc:  Oral Oral Oral  SpO2: 100% 100% 100% 100%  Weight:      Height:       Physical Exam General: Well appearing man, NAD. Room air. Heart: RRR; no murmur Lungs: CTA anteriorly Abdomen: soft, non-tender Extremities: no RLE edema, L BKA Dialysis Access: LUE AVF +t/b   Additional Objective Labs: Basic Metabolic Panel: Recent Labs  Lab 07/18/23 0416 07/18/23 1006 07/19/23 0458 07/20/23 0413  NA 141  --  134* 136  K 5.8*  --  3.3* 3.5  CL 103  --  95* 97*  CO2 21*  --  29 27  GLUCOSE 115*  --  96 74  BUN 46*  --  18 28*  CREATININE 5.06*  --  2.96* 4.21*  CALCIUM 7.5*  --  8.0* 8.2*  PHOS  --  3.3 2.9 4.8*   Liver Function Tests: Recent Labs  Lab 07/17/23 1838 07/19/23 0458 07/20/23 0413  AST 33  --   --   ALT 44  --   --   ALKPHOS 145*  --   --   BILITOT 0.7  --   --   PROT 6.3*  --   --   ALBUMIN 2.7* 2.5* 2.5*   CBC: Recent Labs  Lab 07/17/23 1838 07/18/23 0416 07/19/23 0457 07/20/23 0413  WBC 8.8 9.0 8.1 8.7  NEUTROABS 6.6  --   --   --   HGB 7.5* 7.4* 7.2* 7.3*  HCT 24.3* 22.3* 22.6* 22.5*  MCV 102.5* 95.7 97.8 97.8  PLT 160 147* 145* 140*   Medications:   amiodarone  200 mg Oral Daily   atorvastatin  20 mg Oral Daily   bethanechol  5 mg Oral TID   calcitRIOL  0.25 mcg Oral Q T,Th,Sa-HD   Chlorhexidine Gluconate Cloth  6 each Topical Q0600   ferrous sulfate  325 mg Oral Q breakfast   metoprolol succinate  12.5 mg Oral Once per day on Monday Wednesday Friday   pantoprazole  40 mg Oral BID AC    peg 3350 powder  0.5 kit Oral Once   And   peg 3350 powder  0.5 kit Oral Once   tamsulosin  0.4 mg Oral Once per day on Monday Wednesday Friday   Dialysis Orders: TTS - GOC 3:45hr, 400/700, EDW  51kg, 2K/2.5Ca bath, AVF, heparin 2000 unit bolus - Under EDW recently - last HD 12/14, post-HD weight 49.3kg - Calcitriol 0.4mcg PO q HD - Mircera IV q 2 weeks - last 12/7, due 12/21   Assessment/Plan: ABLA/suspected GIB: Hgb 11 -> 7.5. FOBT + in ED. GI consulted, S/p EGD 12/18 - bleeding gastric AVMs noted - unable to be fully treated. Hgb 7.3 today - stable. Per GI team. Hyperkalemia: On admit, resolved with HD. ESRD: Continue HD on TTS schedule - for HD today, no heparin. HTN/volume: BP good, no edema. Low UF goal next HD. Anemia of ESRD: See #1. Not due for ESA yet.  Secondary HPTH: CorrCa/Phos good. Resumed VDRA, no binders for now. Nutrition: Alb low, will add supplements once on full diet. A-fib: On amiodarone + Eliquis (which is on hold).   Ian Hoyle, PA-C 07/20/2023, 9:56 AM  [employer]

## 2023-07-20 NOTE — Progress Notes (Signed)
PROGRESS NOTE  [name]    DOB: 1955-09-12, 67 y.o.  EAV:[id number]    Code Status: Full Code   DOA: 07/17/2023   LOS: 2   Brief hospital course  [name] is a 67 y.o. male with a history of ESRD (HD TTS), left BKA, AFib on eliquis, HFrecoveredEF, T2DM who was sent to the ED on 07/17/2023 due to low hemoglobin found on outpatient labs. Hgb was reportedly 6.9g/dl down from recent baseline of 10-11. He has dark stool in the setting of taking oral iron, which was +FOBT in the ED. He had no specific complaints. Hgb here has been 7.5 > 7.4 > 7.2. EGD/colonoscopy is recommended by GI to be performed 12/18.   07/20/23 -doing well.   Assessment & Plan  Principal Problem:   ABLA (acute blood loss anemia) Active Problems:   GIB (gastrointestinal bleeding)   ESRD on dialysis (HCC)   DM (diabetes mellitus) type II controlled, neurological manifestation (HCC)   Atrial fibrillation (HCC)   Heme positive stool   Acute gastritis with hemorrhage   Gastric and duodenal angiodysplasia with hemorrhage  ABLA on anemia of ESRD: no evidence of source of bleed on EGD. GI recommended colonoscopy but patient declined and GI signed off. Hgb 7.3 - follow CBC - can likely dc if remains stable although borderline - continue iron supplement  ESRD:  - Routine HD per nephrology while here. - Next ESA due 12/21.  - nephrology consulted.    Hyperkalemia: resolved   PAF:  - Continue metoprolol, amiodarone, hold eliquis.    T2DM: No home meds for this. Last HbA1c was 5.6%.    HLD:  - Continue statin   Thrombocytopenia: Stable.  Body mass index is 21.65 kg/m.  VTE ppx: SCDs Start: 07/17/23 2301   Diet:     Diet   Diet renal with fluid restriction Fluid restriction: 1200 mL Fluid; Room service appropriate? Yes; Fluid consistency: Thin   Consultants: Nephrology  GI  Subjective 07/20/23    Pt reports no complaints today. Denies bleeding.    Objective   Vitals:   07/19/23 1325  07/19/23 1556 07/19/23 2155 07/20/23 0619  BP: (!) 136/58 138/80 (!) 157/79 (!) 101/41  Pulse: 89 83 87 72  Resp: 18 18    Temp: 98 F (36.7 C) 98 F (36.7 C) 98.4 F (36.9 C) 98.4 F (36.9 C)  TempSrc: Oral  Oral Oral  SpO2: 98% 100% 100% 100%  Weight:      Height:        Intake/Output Summary (Last 24 hours) at 07/20/2023 0829 Last data filed at 07/20/2023 0700 Gross per 24 hour  Intake 460 ml  Output 0 ml  Net 460 ml   Filed Weights   07/18/23 1524 07/18/23 1935 07/19/23 1050  Weight: 56.1 kg 53.7 kg 53.7 kg     Physical Exam:  General: awake, alert, NAD HEENT: atraumatic, clear conjunctiva, anicteric sclera, MMM, hearing grossly normal Respiratory: normal respiratory effort. Cardiovascular: quick capillary refill, normal S1/S2, RRR, no JVD, murmurs Gastrointestinal: soft, NT, ND Nervous: A&O x3. no gross focal neurologic deficits, normal speech Extremities: moves all equally, no edema, normal tone Skin: dry, intact, normal temperature, normal color. No rashes, lesions or ulcers on exposed skin Psychiatry: normal mood, congruent affect  Labs   I have personally reviewed the following labs and imaging studies CBC    Component Value Date/Time   WBC 8.7 07/20/2023 0413   RBC 2.30 (L) 07/20/2023 0413   HGB 7.3 (  L) 07/20/2023 0413   HGB 10.4 (L) 04/15/2020 1015   HCT 22.5 (L) 07/20/2023 0413   HCT 31.2 (L) 04/15/2020 1015   PLT 140 (L) 07/20/2023 0413   PLT 194 04/15/2020 1015   MCV 97.8 07/20/2023 0413   MCV 90 04/15/2020 1015   MCH 31.7 07/20/2023 0413   MCHC 32.4 07/20/2023 0413   RDW 16.8 (H) 07/20/2023 0413   RDW 15.4 04/15/2020 1015   LYMPHSABS 1.3 07/17/2023 1838   MONOABS 0.7 07/17/2023 1838   EOSABS 0.1 07/17/2023 1838   BASOSABS 0.0 07/17/2023 1838      Latest Ref Rng & Units 07/20/2023    4:13 AM 07/19/2023    4:58 AM 07/18/2023    4:16 AM  BMP  Glucose 70 - 99 mg/dL 74  96  093   BUN 8 - 23 mg/dL 28  18  46   Creatinine 0.61 - 1.24 mg/dL  2.35  5.73  2.20   Sodium 135 - 145 mmol/L 136  134  141   Potassium 3.5 - 5.1 mmol/L 3.5  3.3  5.8   Chloride 98 - 111 mmol/L 97  95  103   CO2 22 - 32 mmol/L 27  29  21    Calcium 8.9 - 10.3 mg/dL 8.2  8.0  7.5     No results found.  Disposition Plan & Communication  Patient status: Inpatient  Admitted From: Home Planned disposition location: Home Anticipated discharge date: 12/20 pending hgb stable  Family Communication: none at bedside     Author: Leeroy Bock, DO [facility] 07/20/2023, 8:29 AM   Available by Epic secure chat 7AM-7PM. If 7PM-7AM, please contact night-coverage.  [facility] contact information found on [email or web address].

## 2023-07-20 NOTE — Plan of Care (Signed)
  Problem: Education: Goal: Knowledge of General Education information will improve Description Including pain rating scale, medication(s)/side effects and non-pharmacologic comfort measures Outcome: Progressing   

## 2023-07-20 NOTE — Plan of Care (Signed)
  Problem: Education: Goal: Knowledge of General Education information will improve Description: Including pain rating scale, medication(s)/side effects and non-pharmacologic comfort measures Outcome: Progressing   Problem: Health Behavior/Discharge Planning: Goal: Ability to manage health-related needs will improve Outcome: Progressing   Problem: Clinical Measurements: Goal: Ability to maintain clinical measurements within normal limits will improve Outcome: Progressing Goal: Will remain free from infection Outcome: Progressing Goal: Diagnostic test results will improve Outcome: Progressing Goal: Respiratory complications will improve Outcome: Progressing Goal: Cardiovascular complication will be avoided Outcome: Progressing   Problem: Activity: Goal: Risk for activity intolerance will decrease Outcome: Progressing   Problem: Nutrition: Goal: Adequate nutrition will be maintained Outcome: Progressing   Problem: Coping: Goal: Level of anxiety will decrease Outcome: Progressing   Problem: Elimination: Goal: Will not experience complications related to bowel motility Outcome: Progressing Goal: Will not experience complications related to urinary retention Outcome: Progressing   Problem: Pain Management: Goal: General experience of comfort will improve Outcome: Progressing   Problem: Safety: Goal: Ability to remain free from injury will improve Outcome: Progressing   Problem: Skin Integrity: Goal: Risk for impaired skin integrity will decrease Outcome: Progressing   Problem: Education: Goal: Ability to identify signs and symptoms of gastrointestinal bleeding will improve Outcome: Progressing   Problem: Bowel/Gastric: Goal: Will show no signs and symptoms of gastrointestinal bleeding Outcome: Progressing   Problem: Fluid Volume: Goal: Will show no signs and symptoms of excessive bleeding Outcome: Progressing   Problem: Clinical Measurements: Goal:  Complications related to the disease process, condition or treatment will be avoided or minimized Outcome: Progressing   Problem: Education: Goal: Knowledge of disease and its progression will improve Outcome: Progressing Goal: Individualized Educational Video(s) Outcome: Progressing   Problem: Fluid Volume: Goal: Compliance with measures to maintain balanced fluid volume will improve Outcome: Progressing   Problem: Health Behavior/Discharge Planning: Goal: Ability to manage health-related needs will improve Outcome: Progressing   Problem: Nutritional: Goal: Ability to make healthy dietary choices will improve Outcome: Progressing   Problem: Clinical Measurements: Goal: Complications related to the disease process, condition or treatment will be avoided or minimized Outcome: Progressing

## 2023-07-20 NOTE — Progress Notes (Signed)
   07/20/23 1652  Vitals  Temp (!) 97.5 F (36.4 C)  Pulse Rate 71  Resp 10  SpO2 100 %  O2 Device Room Air  Oxygen Therapy  Patient Activity (if Appropriate) In bed  Pulse Oximetry Type Continuous  Post Treatment  Dialyzer Clearance Lightly streaked  Liters Processed 84  Fluid Removed (mL) 800 mL  Tolerated HD Treatment No (Comment) (Patient was hypotensive during HD, unable to met UFG goal. removed.)  AVG/AVF Arterial Site Held (minutes) 10 minutes  AVG/AVF Venous Site Held (minutes) 10 minutes       Received patient in bed to unit.  Alert and oriented.  Informed consent signed and in chart.   TX duration:3.5 hours  Patient tolerated well.  Transported back to the room  Alert, without acute distress.  Hand-off given to patient's nurse.   Access used: Yes Access issues: NO  Total UF removed: Medication(s) given: NO    Laqueta Due, RN [facility]

## 2023-07-21 ENCOUNTER — Encounter (HOSPITAL_COMMUNITY): Payer: Self-pay | Admitting: Internal Medicine

## 2023-07-21 DIAGNOSIS — K921 Melena: Secondary | ICD-10-CM

## 2023-07-21 DIAGNOSIS — D649 Anemia, unspecified: Secondary | ICD-10-CM

## 2023-07-21 LAB — RENAL FUNCTION PANEL
Albumin: 2.4 g/dL — ABNORMAL LOW (ref 3.5–5.0)
Anion gap: 16 — ABNORMAL HIGH (ref 5–15)
BUN: 18 mg/dL (ref 8–23)
CO2: 26 mmol/L (ref 22–32)
Calcium: 8.4 mg/dL — ABNORMAL LOW (ref 8.9–10.3)
Chloride: 95 mmol/L — ABNORMAL LOW (ref 98–111)
Creatinine, Ser: 2.94 mg/dL — ABNORMAL HIGH (ref 0.61–1.24)
GFR, Estimated: 23 mL/min — ABNORMAL LOW (ref >60)
Glucose, Bld: 53 mg/dL — ABNORMAL LOW (ref 70–99)
Phosphorus: 3.4 mg/dL (ref 2.5–4.6)
Potassium: 3.5 mmol/L (ref 3.5–5.1)
Sodium: 137 mmol/L (ref 135–145)

## 2023-07-21 LAB — CBC
HCT: 22.9 % — ABNORMAL LOW (ref 39.0–52.0)
Hemoglobin: 7.3 g/dL — ABNORMAL LOW (ref 13.0–17.0)
MCH: 31.3 pg (ref 26.0–34.0)
MCHC: 31.9 g/dL (ref 30.0–36.0)
MCV: 98.3 fL (ref 80.0–100.0)
Platelets: 146 10*3/uL — ABNORMAL LOW (ref 150–400)
RBC: 2.33 MIL/uL — ABNORMAL LOW (ref 4.22–5.81)
RDW: 16.6 % — ABNORMAL HIGH (ref 11.5–15.5)
WBC: 8.2 10*3/uL (ref 4.0–10.5)
nRBC: 0 % (ref 0.0–0.2)

## 2023-07-21 LAB — GLUCOSE, CAPILLARY
Glucose-Capillary: 48 mg/dL — ABNORMAL LOW (ref 70–99)
Glucose-Capillary: 48 mg/dL — ABNORMAL LOW (ref 70–99)
Glucose-Capillary: 128 mg/dL — ABNORMAL HIGH (ref 70–99)
Glucose-Capillary: 68 mg/dL — ABNORMAL LOW (ref 70–99)

## 2023-07-21 LAB — GLUCOSE, RANDOM: Glucose, Bld: 59 mg/dL — ABNORMAL LOW (ref 70–99)

## 2023-07-21 MED ORDER — GLUCOSE 4 G PO CHEW
CHEWABLE_TABLET | ORAL | Status: AC
  Filled 2023-07-21: qty 1

## 2023-07-21 MED ORDER — PANTOPRAZOLE SODIUM 40 MG PO TBEC: 40.0000 mg | DELAYED_RELEASE_TABLET | Freq: Two times a day (BID) | ORAL | Status: DC

## 2023-07-21 MED ORDER — APIXABAN 2.5 MG PO TABS
2.5000 mg | ORAL_TABLET | Freq: Two times a day (BID) | ORAL | Status: DC
Start: 2023-07-21 — End: 2023-07-22

## 2023-07-21 MED ORDER — CALCITRIOL 0.25 MCG PO CAPS: 0.2500 ug | ORAL_CAPSULE | ORAL | Status: DC

## 2023-07-21 MED ORDER — APIXABAN 2.5 MG PO TABS: 2.5000 mg | ORAL_TABLET | Freq: Two times a day (BID) | ORAL | Status: DC

## 2023-07-21 NOTE — Progress Notes (Signed)
Pt. Drank 4 oz of apple juice and ate 4 graham crackers, Blood glucose rechecked, blood glucose remains 48, stat glucose ordered, MD aware, discharge postponed at present.  Margarita Grizzle

## 2023-07-21 NOTE — Progress Notes (Signed)
[facility] Progress Note   Subjective:   Seen in room - feels well. No CP/dyspnea or abdominal pain.   Objective Vitals:   07/20/23 1803 07/20/23 2102 07/21/23 0431 07/21/23 0740  BP: (!) 143/69 137/63 (!) 138/59 (!) 143/60  Pulse: 74 77 72 77  Resp: 18 20 20 18   Temp: 98.3 F (36.8 C) 98.2 F (36.8 C) 98.9 F (37.2 C) 99.1 F (37.3 C)  TempSrc: Oral Oral Oral Oral  SpO2: 100% 100% 100% 100%  Weight:      Height:       Physical Exam General: Well appearing man, NAD. Room air. Heart: RRR; no murmur Lungs: CTA anteriorly Abdomen: soft, non-tender Extremities: no RLE edema, L BKA Dialysis Access: LUE AVF +t/b  Additional Objective Labs: Basic Metabolic Panel: Recent Labs  Lab 07/19/23 0458 07/20/23 0413 07/21/23 0613  NA 134* 136 137  K 3.3* 3.5 3.5  CL 95* 97* 95*  CO2 29 27 26   GLUCOSE 96 74 53*  BUN 18 28* 18  CREATININE 2.96* 4.21* 2.94*  CALCIUM 8.0* 8.2* 8.4*  PHOS 2.9 4.8* 3.4   Liver Function Tests: Recent Labs  Lab 07/17/23 1838 07/19/23 0458 07/20/23 0413 07/21/23 0613  AST 33  --   --   --   ALT 44  --   --   --   ALKPHOS 145*  --   --   --   BILITOT 0.7  --   --   --   PROT 6.3*  --   --   --   ALBUMIN 2.7* 2.5* 2.5* 2.4*   CBC: Recent Labs  Lab 07/17/23 1838 07/18/23 0416 07/19/23 0457 07/20/23 0413 07/21/23 0613  WBC 8.8 9.0 8.1 8.7 8.2  NEUTROABS 6.6  --   --   --   --   HGB 7.5* 7.4* 7.2* 7.3* 7.3*  HCT 24.3* 22.3* 22.6* 22.5* 22.9*  MCV 102.5* 95.7 97.8 97.8 98.3  PLT 160 147* 145* 140* 146*   Medications:   amiodarone  200 mg Oral Daily   atorvastatin  20 mg Oral Daily   bethanechol  5 mg Oral TID   calcitRIOL  0.25 mcg Oral Q T,Th,Sa-HD   Chlorhexidine Gluconate Cloth  6 each Topical Q0600   ferrous sulfate  325 mg Oral Q breakfast   metoprolol succinate  12.5 mg Oral Once per day on Monday Wednesday Friday   pantoprazole  40 mg Oral BID AC   peg 3350 powder  0.5 kit Oral Once   And   peg 3350  powder  0.5 kit Oral Once   tamsulosin  0.4 mg Oral Once per day on Monday Wednesday Friday    Dialysis Orders: TTS - GOC 3:45hr, 400/700, EDW  51kg, 2K/2.5Ca bath, AVF, heparin 2000 unit bolus - Under EDW recently - last HD 12/14, post-HD weight 49.3kg - Calcitriol 0.76mcg PO q HD - Mircera IV q 2 weeks - last 12/7, due 12/21   Assessment/Plan: ABLA/suspected GIB: Hgb 11 -> 7.5. FOBT + in ED. GI consulted, S/p EGD 12/18 - bleeding gastric AVMs noted - unable to be fully treated. He declined colonoscopy. Hgb stable/low - 7.3. Hyperkalemia: On admit, resolved with HD. ESRD: Continue HD on TTS schedule - next HD tomorrow (either here or outpatient). HTN/volume: BP good, no edema. Low UF goal next HD. Anemia of ESRD: See #1. Not due for ESA yet. Secondary HPTH: CorrCa/Phos good. Resumed VDRA, no binders for now. Nutrition: Alb low, rec supplements.  A-fib: On amiodarone + Eliquis (which remains on hold). Dispo: Ok from renal standpoint once cleared medically.   Ozzie Hoyle, PA-C 07/21/2023, 10:32 AM  [employer]

## 2023-07-21 NOTE — Progress Notes (Signed)
[facility] here to pick up pt., pt. Blood glucose 48, pt. Asymptomatic, juice and crackers provided, MD aware, will re-check blood glucose  [name]

## 2023-07-21 NOTE — Plan of Care (Signed)
  Problem: Education: Goal: Knowledge of General Education information will improve Description: Including pain rating scale, medication(s)/side effects and non-pharmacologic comfort measures Outcome: Progressing   Problem: Health Behavior/Discharge Planning: Goal: Ability to manage health-related needs will improve Outcome: Progressing   Problem: Clinical Measurements: Goal: Ability to maintain clinical measurements within normal limits will improve Outcome: Progressing Goal: Will remain free from infection Outcome: Progressing Goal: Diagnostic test results will improve Outcome: Progressing Goal: Respiratory complications will improve Outcome: Progressing Goal: Cardiovascular complication will be avoided Outcome: Progressing   Problem: Activity: Goal: Risk for activity intolerance will decrease Outcome: Progressing   Problem: Nutrition: Goal: Adequate nutrition will be maintained Outcome: Progressing   Problem: Coping: Goal: Level of anxiety will decrease Outcome: Progressing   Problem: Elimination: Goal: Will not experience complications related to bowel motility Outcome: Progressing Goal: Will not experience complications related to urinary retention Outcome: Progressing   Problem: Pain Management: Goal: General experience of comfort will improve Outcome: Progressing   Problem: Safety: Goal: Ability to remain free from injury will improve Outcome: Progressing   Problem: Skin Integrity: Goal: Risk for impaired skin integrity will decrease Outcome: Progressing   Problem: Education: Goal: Ability to identify signs and symptoms of gastrointestinal bleeding will improve Outcome: Progressing   Problem: Bowel/Gastric: Goal: Will show no signs and symptoms of gastrointestinal bleeding Outcome: Progressing   Problem: Fluid Volume: Goal: Will show no signs and symptoms of excessive bleeding Outcome: Progressing   Problem: Clinical Measurements: Goal:  Complications related to the disease process, condition or treatment will be avoided or minimized Outcome: Progressing   Problem: Education: Goal: Knowledge of disease and its progression will improve Outcome: Progressing Goal: Individualized Educational Video(s) Outcome: Progressing   Problem: Fluid Volume: Goal: Compliance with measures to maintain balanced fluid volume will improve Outcome: Progressing   Problem: Health Behavior/Discharge Planning: Goal: Ability to manage health-related needs will improve Outcome: Progressing   Problem: Nutritional: Goal: Ability to make healthy dietary choices will improve Outcome: Progressing   Problem: Clinical Measurements: Goal: Complications related to the disease process, condition or treatment will be avoided or minimized Outcome: Progressing

## 2023-07-21 NOTE — Progress Notes (Signed)
Blood glucose 68, glucose tablets provided per MD  Margarita Grizzle

## 2023-07-21 NOTE — Plan of Care (Signed)
  Problem: Clinical Measurements: Goal: Will remain free from infection Outcome: Progressing   Problem: Clinical Measurements: Goal: Diagnostic test results will improve Outcome: Progressing   Problem: Clinical Measurements: Goal: Respiratory complications will improve Outcome: Progressing   

## 2023-07-21 NOTE — NC FL2 (Signed)
[place] MEDICAID FL2 LEVEL OF CARE FORM     IDENTIFICATION  Patient Name: [name] Birthdate: 1956/01/22 Sex: male Admission Date (Current Location): 07/17/2023  [facility] and [place] Number:  Producer, television/film/video and Address:  [facility] [place], [address], [place], [place] [address]      Provider Number: [id number]  Attending Physician Name and Address:  Arnetha Courser, MD  Relative Name and Phone Number:       Current Level of Care: Hospital Recommended Level of Care: Skilled Nursing Facility Prior Approval Number:    Date Approved/Denied:   PASRR Number: [id number]  Discharge Plan: SNF    Current Diagnoses: Patient Active Problem List   Diagnosis Date Noted   Melena 07/21/2023   Heme positive stool 07/19/2023   Acute gastritis with hemorrhage 07/19/2023   Gastric and duodenal angiodysplasia with hemorrhage 07/19/2023   ABLA (acute blood loss anemia) 07/17/2023   GIB (gastrointestinal bleeding) 07/17/2023   NSTEMI (non-ST elevated myocardial infarction) (HCC) 10/05/2022   Ileus (HCC) 09/30/2022   Urinary retention 09/30/2022   Paralytic ileus (HCC) 09/30/2022   ESRD (end stage renal disease) (HCC) 09/30/2022   Acute on chronic systolic congestive heart failure (HCC) 09/30/2022   Hypotension 09/29/2022   Atrial fibrillation (HCC) 09/29/2022   DVT (deep venous thrombosis) (HCC) 09/24/2022   Hospital-acquired bacterial pneumonia 09/24/2022   Acute hypoxic respiratory failure (HCC) 09/23/2022   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   Fluid overload, unspecified 11/06/2020   COVID-19 08/10/2020   Iron deficiency anemia, unspecified 01/10/2020   Abnormality of albumin 12/20/2019   Encounter for immunization 12/17/2019   Anaphylactic shock, unspecified, initial encounter 12/16/2019   Cardiomyopathy, unspecified (HCC) 12/16/2019   Heart failure, unspecified (HCC) 12/16/2019   Hyperparathyroidism, unspecified  (HCC) 12/16/2019   Metabolic disorder, unspecified 12/16/2019   Allergy, unspecified, initial encounter 12/16/2019   Other specified coagulation defects (HCC) 12/16/2019   Pain, unspecified 12/16/2019   Acute systolic CHF (congestive heart failure) (HCC)    S/P dialysis catheter insertion (HCC)    ESRD on dialysis (HCC) 12/12/2019   PAD (peripheral artery disease) (HCC) 12/12/2019   Pressure injury of skin 12/01/2019   Anemia 11/29/2019   Uncontrolled hypertension 11/29/2019   CKD (chronic kidney disease), stage V (HCC) 11/29/2019   Severe protein-calorie malnutrition (HCC)    Diabetic polyneuropathy associated with type 2 diabetes mellitus (HCC)    Venous insufficiency of right lower extremity    Pneumonia    Renal failure 11/28/2019   Toe gangrene (HCC) 12/18/2013   Gangrene of toe (HCC) 09/16/2013   Fever with chills 09/16/2013   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/2013   DKA (diabetic ketoacidosis) (HCC) 09/04/2013   Type 2 diabetes mellitus, controlled (HCC) 09/02/2013   DM (diabetes mellitus) type II controlled, neurological manifestation (HCC) 09/01/2013    Orientation RESPIRATION BLADDER Height & Weight     Self, Time, Situation, Place  Normal Continent Weight: (S) 114 lb 6.7 oz (51.9 kg) (Bed) Height:  5\' 2"  (157.5 cm)  BEHAVIORAL SYMPTOMS/MOOD NEUROLOGICAL BOWEL NUTRITION STATUS      Incontinent Diet (please see discharge summary)  AMBULATORY STATUS COMMUNICATION OF NEEDS Skin   Extensive Assist Verbally Other (Comment) (Pressure injury, Rt Heel Stage II, Presdure injury Rt Sacrum Stage II, Pressure Injury Left Buttocks Stage II)                       Personal Care Assistance Level of Assistance  Bathing,  Feeding, Dressing Bathing Assistance: Limited assistance Feeding assistance: Independent Dressing Assistance: Limited assistance     Functional Limitations Info  Sight, Hearing, Speech Sight Info: Adequate Hearing Info: Adequate      SPECIAL CARE  FACTORS FREQUENCY  PT (By licensed PT), OT (By licensed OT)                    Contractures Contractures Info: Not present    Additional Factors Info  Code Status, Allergies Code Status Info: Full Code Allergies Info: Tramadol           Current Medications (07/21/2023):  This is the current hospital active medication list Current Facility-Administered Medications  Medication Dose Route Frequency Provider Last Rate Last Admin   acetaminophen (TYLENOL) tablet 650 mg  650 mg Oral Q6H PRN [name], DO       Or   acetaminophen (TYLENOL) suppository 650 mg  650 mg Rectal Q6H PRN [name], DO       amiodarone (PACERONE) tablet 200 mg  200 mg Oral Daily Julian Reil, Jared M, DO   200 mg at 07/21/23 1324   apixaban (ELIQUIS) tablet 2.5 mg  2.5 mg Oral BID Arnetha Courser, MD       atorvastatin (LIPITOR) tablet 20 mg  20 mg Oral Daily Lyda Perone M, DO   20 mg at 07/21/23 4010   bethanechol (URECHOLINE) tablet 5 mg  5 mg Oral TID [name], DO   5 mg at 07/21/23 2725   calcitRIOL (ROCALTROL) capsule 0.25 mcg  0.25 mcg Oral Q T,Th,Sa-HD Julien Nordmann, PA-C   0.25 mcg at 07/20/23 1812   Chlorhexidine Gluconate Cloth 2 % PADS 6 each  6 each Topical Q0600 Delano Metz, MD   6 each at 07/18/23 1034   ferrous sulfate tablet 325 mg  325 mg Oral Q breakfast [name], DO   325 mg at 07/21/23 3664   metoprolol succinate (TOPROL-XL) 24 hr tablet 12.5 mg  12.5 mg Oral Once per day on Monday Wednesday Friday [name], DO   12.5 mg at 07/21/23 0806   ondansetron (ZOFRAN) tablet 4 mg  4 mg Oral Q6H PRN [name], DO       Or   ondansetron [facility]) injection 4 mg  4 mg Intravenous Q6H PRN [name], DO       Oral care mouth rinse  15 mL Mouth Rinse PRN [name], DO       pantoprazole (PROTONIX) EC tablet 40 mg  40 mg Oral BID AC Pyrtle, Carie Caddy, MD   40 mg at 07/21/23 0806   peg 3350 powder (MOVIPREP) kit 100 g  0.5 kit Oral Once  Meredith Pel, NP       And   peg 3350 powder (MOVIPREP) kit 100 g  0.5 kit Oral Once Meredith Pel, NP       tamsulosin [facility]) capsule 0.4 mg  0.4 mg Oral Once per day on Monday Wednesday Friday [name], DO         Discharge Medications: Please see discharge summary for a list of discharge medications.  Relevant Imaging Results:  Relevant Lab Results:   Additional Information  SSN # [id number]   Pt receives out-pt HD at [facility] on TTS   Eduard Roux, LCSW

## 2023-07-21 NOTE — TOC Transition Note (Signed)
Transition of Care [facility]) - Discharge Note   Patient Details  Name: [name] MRN: [id number] Date of Birth: 1956/02/14  [facility]) CM/SW Contact:  Eduard Roux, LCSW Phone Number: 07/21/2023, 1:29 PM   Clinical Narrative:     Patient will Discharge to: [facility]  Discharge Date: 07/20/2021 Family Notified: Tammy/Friend Transport By: Sharin Mons  Per MD patient is ready for discharge. RN, patient, and facility notified of discharge. Discharge Summary sent to facility. RN given number for [phone], Room 104-B. Ambulance transport requested for patient.   Clinical Social Worker signing off.  Antony Blackbird, MSW, LCSW Clinical Social Worker     Final next level of care: Skilled Nursing Facility Barriers to Discharge: Barriers Resolved   Patient Goals and CMS Choice            Discharge Placement              Patient [facility] bed at: [facility] Patient to be transferred to facility by: [facility] Name of family member notified: Tammy/friend Patient and family notified of of transfer: 07/21/23  Discharge Plan and Services Additional resources added to the After Visit Summary for                                       Social Drivers of Health (SDOH) Interventions SDOH Screenings   Food Insecurity: No Food Insecurity (07/18/2023)  Housing: Unknown (07/18/2023)  Transportation Needs: No Transportation Needs (07/18/2023)  Utilities: Not At Risk (07/18/2023)  Tobacco Use: Low Risk  (07/19/2023)     Readmission Risk Interventions     No data to display

## 2023-07-21 NOTE — Discharge Summary (Signed)
Physician Discharge Summary   Patient: [name] MRN: [id number] DOB: 02-Nov-1955  Admit date:     07/17/2023  Discharge date: 07/21/23  Discharge Physician: Arnetha Courser   PCP: Patient, No Pcp Per   Recommendations at discharge:  Please obtain CBC and BMP on follow-up Please monitor for any abnormal bleeding including melena Eliquis dose has been decreased to 2.5 mg twice daily and he will restart on discharge as advised by gastroenterology. Please continue twice daily PPI Continue with iron supplement Continue with routine dialysis, Tuesday, Thursday and Saturday Please monitor hemoglobin closely. Follow-up with gastroenterology for stomach biopsy results.  Discharge Diagnoses: Principal Problem:   ABLA (acute blood loss anemia) Active Problems:   GIB (gastrointestinal bleeding)   ESRD on dialysis (HCC)   DM (diabetes mellitus) type II controlled, neurological manifestation (HCC)   Atrial fibrillation (HCC)   Heme positive stool   Acute gastritis with hemorrhage   Gastric and duodenal angiodysplasia with hemorrhage   Melena  Resolved Problems:   * No resolved hospital problems. *  Hospital Course: [name] is a 67 y.o. male with a history of ESRD (HD TTS), left BKA, AFib on eliquis, HFrecoveredEF, T2DM who was sent to the ED on 07/17/2023 due to low hemoglobin found on outpatient labs. Hgb was reportedly 6.9g/dl down from recent baseline of 10-11. He has dark stool in the setting of taking oral iron, which was +FOBT in the ED. He had no specific complaints. Hgb here has been 7.5 > 7.4 > 7.2.>7.3  EGD/colonoscopy showed gastric angiodysplasia with 1 area shows stigmata of recent bleeding, unsuccessful attempt to ligate. Gastroenterology also recommended colonoscopy but patient refused.  His home Eliquis was initially held and then resume on discharge at a lower dose of 2.5 mg twice daily as recommended by gastroenterology.  If patient had more bleeding he need to  follow-up with gastroenterology for further management. Patient will continue on iron supplement and continue to get ESA during dialysis.  Biopsies were taken for which he need to follow-up with gastroenterology for results.  Patient will continue with his routine dialysis on Tuesday, Thursday and Saturday.  He was also found to have mild hyperkalemia which was resolved before discharge.  Patient will continue on current medication, if continue to have GI bleed please discuss with cardiology to do risk and benefit about continuing anticoagulation with history of paroxysmal atrial fibrillation.  Patient will continue on current medications and need to have a close follow-up with his providers for further management.       Consultants: Gastroenterology.  Nephrology Procedures performed: EGD, hemodialysis Disposition: Skilled nursing facility Diet recommendation:  Discharge Diet Orders (From admission, onward)     Start     Ordered   07/21/23 0000  Diet - low sodium heart healthy        07/21/23 1303           Cardiac and Carb modified diet DISCHARGE MEDICATION: Allergies as of 07/21/2023       Reactions   Tramadol Nausea And Vomiting   Pt states he took this on an empty stomach one time and it came right back up. He's not had it since.         Medication List     TAKE these medications    amiodarone 200 MG tablet Commonly known as: PACERONE Take 1 tablet (200 mg total) by mouth daily.   apixaban 2.5 MG Tabs tablet Commonly known as: ELIQUIS Take 1 tablet (2.5 mg total) by  mouth 2 (two) times daily. What changed:  medication strength how much to take   atorvastatin 20 MG tablet Commonly known as: LIPITOR Take 1 tablet (20 mg total) by mouth daily.   bethanechol 10 MG tablet Commonly known as: URECHOLINE Take 5 mg by mouth 3 (three) times daily.   calcitRIOL 0.25 MCG capsule Commonly known as: ROCALTROL Take 1 capsule (0.25 mcg total) by mouth Every  Tuesday,Thursday,and Saturday with dialysis. Start taking on: July 22, 2023   ferrous sulfate 325 (65 FE) MG tablet Take 325 mg by mouth daily with breakfast.   metoprolol succinate 25 MG 24 hr tablet Commonly known as: TOPROL-XL Take 12.5 mg by mouth 3 (three) times a week.   pantoprazole 40 MG tablet Commonly known as: PROTONIX Take 1 tablet (40 mg total) by mouth 2 (two) times daily before a meal.   PROMETHAZINE HCL IM Inject 25 mg into the muscle every 4 (four) hours as needed (Nausea and Vomiting).   tamsulosin 0.4 MG Caps capsule Commonly known as: FLOMAX Take 0.4 mg by mouth See admin instructions. Take one tablet by mouth 3 times a week on Monday, Wednesday and Fridays               Discharge Care Instructions  (From admission, onward)           Start     Ordered   07/21/23 0000  Discharge wound care:       Comments: -  Partial thickness loss of dermis presenting as a shallow open injury with a red, pink wound bed without slough. Please apply gel dressing and change as needed   07/21/23 1303            Discharge Exam: Filed Weights   07/19/23 1050 07/20/23 1255 07/20/23 1741  Weight: 53.7 kg 52.6 kg (S) 51.9 kg   General.  Frail and malnourished gentleman, in no acute distress. Pulmonary.  Lungs clear bilaterally, normal respiratory effort. CV.  Regular rate and rhythm, no JVD, rub or murmur. Abdomen.  Soft, nontender, nondistended, BS positive. CNS.  Alert and oriented .  No focal neurologic deficit. Extremities.  Left BKA, right toes amputation and heel with gel dressing  Condition at discharge: stable  The results of significant diagnostics from this hospitalization (including imaging, microbiology, ancillary and laboratory) are listed below for reference.   Imaging Studies: No results found.  Microbiology: Results for orders placed or performed during the hospital encounter of 07/17/23  MRSA Next Gen by PCR, Nasal     Status: None    Collection Time: 07/18/23 12:33 AM   Specimen: Nasal Mucosa; Nasal Swab  Result Value Ref Range Status   MRSA by PCR Next Gen NOT DETECTED NOT DETECTED Final    Comment: (NOTE) The GeneXpert MRSA Assay (FDA approved for NASAL specimens only), is one component of a comprehensive MRSA colonization surveillance program. It is not intended to diagnose MRSA infection nor to guide or monitor treatment for MRSA infections. Test performance is not FDA approved in patients less than 85 years old. Performed at [facility], [address], [place], [place] [address]     Labs: CBC: Recent Labs  Lab 07/17/23 1838 07/18/23 0416 07/19/23 0457 07/20/23 0413 07/21/23 0613  WBC 8.8 9.0 8.1 8.7 8.2  NEUTROABS 6.6  --   --   --   --   HGB 7.5* 7.4* 7.2* 7.3* 7.3*  HCT 24.3* 22.3* 22.6* 22.5* 22.9*  MCV 102.5* 95.7 97.8 97.8  98.3  PLT 160 147* 145* 140* 146*   Basic Metabolic Panel: Recent Labs  Lab 07/17/23 1838 07/18/23 0416 07/18/23 1006 07/19/23 0458 07/20/23 0413 07/21/23 0613  NA 136 141  --  134* 136 137  K 3.8 5.8*  --  3.3* 3.5 3.5  CL 98 103  --  95* 97* 95*  CO2 27 21*  --  29 27 26   GLUCOSE 113* 115*  --  96 74 53*  BUN 36* 46*  --  18 28* 18  CREATININE 4.53* 5.06*  --  2.96* 4.21* 2.94*  CALCIUM 8.1* 7.5*  --  8.0* 8.2* 8.4*  PHOS  --   --  3.3 2.9 4.8* 3.4   Liver Function Tests: Recent Labs  Lab 07/17/23 1838 07/19/23 0458 07/20/23 0413 07/21/23 0613  AST 33  --   --   --   ALT 44  --   --   --   ALKPHOS 145*  --   --   --   BILITOT 0.7  --   --   --   PROT 6.3*  --   --   --   ALBUMIN 2.7* 2.5* 2.5* 2.4*   CBG: Recent Labs  Lab 07/18/23 0024  GLUCAP 100*    Discharge time spent: greater than 30 minutes.  This record has been created using Conservation officer, historic buildings. Errors have been sought and corrected,but may not always be located. Such creation errors do not reflect on the standard of care.   Signed: Arnetha Courser, MD [facility] 07/21/2023

## 2023-07-21 NOTE — Progress Notes (Signed)
DISCHARGE NOTE SNF [name] to be discharged Nursing Home [facility]  per MD order. Patient verbalized understanding.  Skin clean, dry and intact without evidence of skin break down, no evidence of skin tears noted. IV catheter discontinued intact. Site without signs and symptoms of complications. Dressing and pressure applied. Pt denies pain at the site currently. No complaints noted.  Patient free of lines, drains, and wounds.   Discharge packet assembled. An After Visit Summary (AVS) was printed and given to the EMS personnel. Patient escorted via stretcher and discharged to [facility] via ambulance. Report called to accepting facility; all questions and concerns addressed.   Carmin Muskrat, RN

## 2023-07-21 NOTE — Progress Notes (Addendum)
Advised by staff that pt will d/c to snf today. Contacted [facility] to be made aware of pt's d/c today and that pt should resume care tomorrow. Clinic also advised that pt will be going to [facility] snf at d/c per CSW.   Olivia Canter Renal Navigator [phone]  Addendum at 1:30 pm: Advised CSW that pt's holiday HD schedule for week of Christmas and New Year's will be Monday, Thursday, Saturday. CSW to provide info to SNF. Appt time will remain the same (11:30 am chair time). Info added to AVS as well.

## 2023-07-21 NOTE — Discharge Planning (Signed)
[facility] Patient Discharge Orders - [facility] CLINIC: [facility]  Patient's name: [name] Admit/DC Dates: 07/17/2023 - 07/21/23  DISCHARGE DIAGNOSES: Symptomatic anemia/GI bleed - EGD with bleeding AVM. unable to be treated Hyperkalemia - resolved with HD   HD ORDER CHANGES: Heparin change: YES - hold heparin for 2 weeks, then reassess by rounding provider EDW Change: no Bath Change: no  ANEMIA MANAGEMENT: Aranesp: Given: no    ESA dose for discharge: mircera 100 mcg IV q 2 weeks, to start on 12/21 IV Iron dose at discharge: per protocol Transfusion: Given: no  BONE/MINERAL MEDICATIONS: Hectorol/Calcitriol change: no Sensipar/Parsabiv change: no  ACCESS INTERVENTION/CHANGE: no Details:  RECENT LABS: Recent Labs  Lab 07/21/23 0613  HGB 7.3*  NA 137  K 3.5  CALCIUM 8.4*  PHOS 3.4  ALBUMIN 2.4*    IV ANTIBIOTICS: no Details:  OTHER ANTICOAGULATION: On Eliquis -> dose reduced to 2.5mg  BID  OTHER/APPTS/LAB ORDERS:  - Pt declined colonoscopy during admit  - Will be on PPI BID D/C Meds to be reconciled by nurse after every discharge.  Completed By: Ozzie Hoyle, PA-C [facility] Pager [phone]   Reviewed by: MD:______ RN_______

## 2023-07-24 NOTE — Care Management Important Message (Signed)
Important Message  Patient Details  Name: [name] MRN: [id number] Date of Birth: 11-10-1955   Important Message Given:  Yes - Medicare IM  Patient left prior to IM delivery will mail a copy to the patient home address.    [name] 07/24/2023, 12:12 PM

## 2023-07-25 LAB — SURGICAL PATHOLOGY

## 2023-08-16 ENCOUNTER — Ambulatory Visit (HOSPITAL_COMMUNITY)
Admission: RE | Admit: 2023-08-16 | Discharge: 2023-08-16 | Disposition: A | Discharge status: Skilled Nursing Facility | Payer: Medicare Other | Source: Ambulatory Visit | Attending: Nephrology | Admitting: Nephrology

## 2023-08-16 ENCOUNTER — Other Ambulatory Visit: Payer: Self-pay

## 2023-08-16 ENCOUNTER — Encounter (HOSPITAL_COMMUNITY): Payer: Self-pay | Admitting: Nephrology

## 2023-08-16 ENCOUNTER — Encounter (HOSPITAL_COMMUNITY)
Admission: RE | Disposition: A | Discharge status: Skilled Nursing Facility | Payer: Self-pay | Source: Ambulatory Visit | Attending: Nephrology

## 2023-08-16 DIAGNOSIS — E785 Hyperlipidemia, unspecified: Secondary | ICD-10-CM | POA: Insufficient documentation

## 2023-08-16 DIAGNOSIS — Y832 Surgical operation with anastomosis, bypass or graft as the cause of abnormal reaction of the patient, or of later complication, without mention of misadventure at the time of the procedure: Secondary | ICD-10-CM | POA: Insufficient documentation

## 2023-08-16 DIAGNOSIS — I5022 Chronic systolic (congestive) heart failure: Secondary | ICD-10-CM | POA: Insufficient documentation

## 2023-08-16 DIAGNOSIS — N250 Renal osteodystrophy: Secondary | ICD-10-CM | POA: Diagnosis not present

## 2023-08-16 DIAGNOSIS — Z992 Dependence on renal dialysis: Secondary | ICD-10-CM | POA: Insufficient documentation

## 2023-08-16 DIAGNOSIS — I2510 Atherosclerotic heart disease of native coronary artery without angina pectoris: Secondary | ICD-10-CM | POA: Diagnosis not present

## 2023-08-16 DIAGNOSIS — I132 Hypertensive heart and chronic kidney disease with heart failure and with stage 5 chronic kidney disease, or end stage renal disease: Secondary | ICD-10-CM | POA: Diagnosis not present

## 2023-08-16 DIAGNOSIS — I252 Old myocardial infarction: Secondary | ICD-10-CM | POA: Insufficient documentation

## 2023-08-16 DIAGNOSIS — I4891 Unspecified atrial fibrillation: Secondary | ICD-10-CM | POA: Diagnosis not present

## 2023-08-16 DIAGNOSIS — Z89512 Acquired absence of left leg below knee: Secondary | ICD-10-CM | POA: Insufficient documentation

## 2023-08-16 DIAGNOSIS — N186 End stage renal disease: Secondary | ICD-10-CM | POA: Insufficient documentation

## 2023-08-16 DIAGNOSIS — T82858A Stenosis of vascular prosthetic devices, implants and grafts, initial encounter: Secondary | ICD-10-CM | POA: Insufficient documentation

## 2023-08-16 DIAGNOSIS — D631 Anemia in chronic kidney disease: Secondary | ICD-10-CM | POA: Diagnosis not present

## 2023-08-16 DIAGNOSIS — E1122 Type 2 diabetes mellitus with diabetic chronic kidney disease: Secondary | ICD-10-CM | POA: Diagnosis not present

## 2023-08-16 HISTORY — PX: PERIPHERAL VASCULAR BALLOON ANGIOPLASTY: CATH118281

## 2023-08-16 HISTORY — PX: A/V FISTULAGRAM: CATH118298

## 2023-08-16 SURGERY — A/V FISTULAGRAM
Anesthesia: LOCAL | Site: Arm Upper

## 2023-08-16 MED ORDER — IODIXANOL 320 MG/ML IV SOLN
INTRAVENOUS | Status: DC | PRN
  Administered 2023-08-16: 15 mL

## 2023-08-16 MED ORDER — LIDOCAINE HCL (PF) 1 % IJ SOLN
INTRAMUSCULAR | Status: DC | PRN
  Administered 2023-08-16: 2 mL via SUBCUTANEOUS

## 2023-08-16 MED ORDER — HEPARIN (PORCINE) IN NACL 1000-0.9 UT/500ML-% IV SOLN
INTRAVENOUS | Status: DC | PRN
  Administered 2023-08-16: 500 mL

## 2023-08-16 MED ORDER — SODIUM CHLORIDE 0.9 % IV SOLN: INTRAVENOUS | Status: DC

## 2023-08-16 MED ORDER — ONDANSETRON HCL 4 MG/2ML IJ SOLN: 4.0000 mg | Freq: Four times a day (QID) | INTRAMUSCULAR | Status: DC | PRN

## 2023-08-16 MED ORDER — MIDAZOLAM HCL 2 MG/2ML IJ SOLN
INTRAMUSCULAR | Status: AC
Start: 2023-08-16 — End: ?
  Filled 2023-08-16: qty 2

## 2023-08-16 MED ORDER — FENTANYL CITRATE (PF) 100 MCG/2ML IJ SOLN
INTRAMUSCULAR | Status: DC | PRN
  Administered 2023-08-16: 25 ug via INTRAVENOUS

## 2023-08-16 MED ORDER — ACETAMINOPHEN 325 MG PO TABS: 650.0000 mg | ORAL_TABLET | ORAL | Status: DC | PRN

## 2023-08-16 MED ORDER — MIDAZOLAM HCL 2 MG/2ML IJ SOLN
INTRAMUSCULAR | Status: DC | PRN
  Administered 2023-08-16: 1 mg via INTRAVENOUS

## 2023-08-16 MED ORDER — SODIUM CHLORIDE 0.9% FLUSH: 3.0000 mL | INTRAVENOUS | Status: DC | PRN

## 2023-08-16 MED ORDER — LIDOCAINE HCL (PF) 1 % IJ SOLN
INTRAMUSCULAR | Status: AC
  Filled 2023-08-16: qty 30

## 2023-08-16 MED ORDER — FENTANYL CITRATE (PF) 100 MCG/2ML IJ SOLN
INTRAMUSCULAR | Status: AC
  Filled 2023-08-16: qty 2

## 2023-08-16 SURGICAL SUPPLY — 9 items
BAG SNAP BAND KOVER 36X36 (MISCELLANEOUS) ×2 IMPLANT
BALLN ATHLETIS 9X40X75 (BALLOONS) ×2
BALLOON ATHLETIS 9X40X75 (BALLOONS) IMPLANT
COVER DOME SNAP 22 D (MISCELLANEOUS) ×2 IMPLANT
SHEATH PINNACLE R/O II 6F 4CM (SHEATH) IMPLANT
SHEATH PINNACLE R/O II 7F 4CM (SHEATH) IMPLANT
SYR MEDALLION 10ML (SYRINGE) IMPLANT
TRAY PV CATH (CUSTOM PROCEDURE TRAY) ×2 IMPLANT
WIRE STARTER BENTSON 035X150 (WIRE) IMPLANT

## 2023-08-16 NOTE — Discharge Instructions (Signed)

## 2023-08-16 NOTE — H&P (Addendum)
 Chief Complaint: Decreased flows  Interval H&P  The patient has presented today for an angiogram/ angioplasty; patient is followed at [facility] with Dr. Yvonnie Heritage.  Various methods of treatment have been discussed with the patient.  After consideration of risk, benefits and other options for treatment, the patient has consented to a angiogram/ angioplasty with  possible stent placement.   Risks of angiogram with potential angioplasty and stenting if needed.contrast reaction, extravasation/ bleeding, dissection, hypotension and death were explained to the patient.  The patient's history has been reviewed and the patient has been examined, no changes in status.  Stable for angiogram/angioplasty  I have reviewed the patient's chart and labs.  Questions were answered to the patient's satisfaction.  OP HD: TTS [facility] 3h  400/700   72.5kg  2/2.5 bath  LUA AVF  Hep 2000 - last HD 2/22, post wt 72.6kg   - rocaltrol  0.50 mcg po tiw - venofer 50 mg weekly IV - mircera 120 mcg IV q 4 wks, last 2/15, due 2/29  Assessment/Plan: ESRD dialyzing at [facility] TTS regimen with last dialysis Tuesday Decreased access flows - planning on angiogram with possibly angioplasty.  Left upper arm brachiocephalic fistula placed Dec 11, 2019 by Dr. Shirley Douglas with plication of aneurysm revision by Dr. Kermit Ped on 07/11/2022. Renal osteodystrophy - continue binders per home regimen. Anemia - managed with ESA's and IV iron at dialysis center. HTN - resume home regimen. DM Afib HFrEF CASHD h/o NSTEMI   HPI: [name] is an 68 y.o. male hypertension, hyperlipidemia, diabetes, Afib w/ h/o RVR, CASHD h/o NSTEMI, HFrEF, PAD with a history of left-sided BKA and right fifth toe ray amputation.  Also with a left-sided brachiocephalic fistula placed in Dec 11, 2019 by Dr. Shirley Douglas. Patient end-stage renal disease and dialyzes Tuesday Thursdays and Saturdays.  Pt denies fever, chills, nausea, vomiting, myalgias, SOB, CP.   ROS Per  HPI.  Chemistry and CBC: Creatinine, Ser  Date/Time Value Ref Range Status  07/21/2023 06:13 AM 2.94 (H) 0.61 - 1.24 mg/dL Final  82/95/6213 08:65 AM 4.21 (H) 0.61 - 1.24 mg/dL Final    Comment:    DELTA CHECK NOTED  07/19/2023 04:58 AM 2.96 (H) 0.61 - 1.24 mg/dL Final  78/46/9629 52:84 AM 5.06 (H) 0.61 - 1.24 mg/dL Final  13/24/4010 27:25 PM 4.53 (H) 0.61 - 1.24 mg/dL Final  36/64/4034 74:25 AM 7.49 (H) 0.61 - 1.24 mg/dL Final  95/63/8756 43:32 AM 5.97 (H) 0.61 - 1.24 mg/dL Final  95/18/8416 60:63 AM 4.37 (H) 0.61 - 1.24 mg/dL Final  01/60/1093 23:55 AM 6.66 (H) 0.61 - 1.24 mg/dL Final  73/22/0254 27:06 AM 4.58 (H) 0.61 - 1.24 mg/dL Final  23/76/2831 51:76 AM 6.86 (H) 0.61 - 1.24 mg/dL Final  16/01/3709 62:69 AM 4.69 (H) 0.61 - 1.24 mg/dL Final  48/54/6270 35:00 AM 5.40 (H) 0.61 - 1.24 mg/dL Final  93/81/8299 37:16 AM 6.48 (H) 0.61 - 1.24 mg/dL Final  96/78/9381 01:75 AM 4.80 (H) 0.61 - 1.24 mg/dL Final  05/25/8526 78:24 AM 6.12 (H) 0.61 - 1.24 mg/dL Final  23/53/6144 31:54 AM 4.51 (H) 0.61 - 1.24 mg/dL Final  00/86/7619 50:93 PM 3.06 (H) 0.61 - 1.24 mg/dL Final  26/71/2458 09:98 AM 8.90 (H) 0.61 - 1.24 mg/dL Final  33/82/5053 97:67 AM 6.86 (H) 0.61 - 1.24 mg/dL Final  34/19/3790 24:09 AM 5.95 (H) 0.61 - 1.24 mg/dL Final  73/53/2992 42:68 AM 4.71 (H) 0.61 - 1.24 mg/dL Final  34/19/6222 97:98 AM 6.67 (H) 0.61 - 1.24 mg/dL  Final  12/13/2019 05:52 PM 6.21 (H) 0.61 - 1.24 mg/dL Final  40/98/1191 47:82 AM 8.69 (H) 0.61 - 1.24 mg/dL Final  95/62/1308 65:78 AM 8.48 (H) 0.61 - 1.24 mg/dL Final  46/96/2952 84:13 AM 7.82 (H) 0.61 - 1.24 mg/dL Final  24/40/1027 25:36 AM 7.21 (H) 0.61 - 1.24 mg/dL Final  64/40/3474 25:95 AM 6.67 (H) 0.61 - 1.24 mg/dL Final  63/87/5643 32:95 AM 6.72 (H) 0.61 - 1.24 mg/dL Final  18/84/1660 63:01 PM 6.85 (H) 0.61 - 1.24 mg/dL Final  60/05/9322 55:73 AM 6.44 (H) 0.61 - 1.24 mg/dL Final  22/09/5425 06:23 AM 6.43 (H) 0.61 - 1.24 mg/dL Final  76/28/3151 76:16  AM 6.28 (H) 0.61 - 1.24 mg/dL Final  07/37/1062 69:48 AM 6.19 (H) 0.61 - 1.24 mg/dL Final  54/62/7035 00:93 AM 5.45 (H) 0.61 - 1.24 mg/dL Final  81/82/9937 16:96 AM 5.12 (H) 0.61 - 1.24 mg/dL Final  78/93/8101 75:10 AM 5.19 (H) 0.61 - 1.24 mg/dL Final  25/85/2778 24:23 PM 5.10 (H) 0.61 - 1.24 mg/dL Final  53/61/4431 54:00 PM 5.04 (H) 0.61 - 1.24 mg/dL Final  86/76/1950 93:26 AM 0.88 0.50 - 1.35 mg/dL Final  71/24/5809 98:33 AM 0.89 0.50 - 1.35 mg/dL Final  82/50/5397 67:34 AM 0.90 0.50 - 1.35 mg/dL Final  19/37/9024 09:73 AM 0.95 0.50 - 1.35 mg/dL Final  53/29/9242 68:34 PM 0.88 0.50 - 1.35 mg/dL Final  19/62/2297 98:92 AM 0.96 0.50 - 1.35 mg/dL Final  11/94/1740 81:44 AM 1.16 0.50 - 1.35 mg/dL Final  81/85/6314 97:02 AM 1.13 0.50 - 1.35 mg/dL Final  63/78/5885 02:77 AM 1.03 0.50 - 1.35 mg/dL Final  41/28/7867 67:20 AM 1.05 0.50 - 1.35 mg/dL Final  94/70/9628 36:62 AM 1.04 0.50 - 1.35 mg/dL Final  94/76/5465 03:54 AM 1.06 0.50 - 1.35 mg/dL Final   No results for input(s): "NA", "K", "CL", "CO2", "GLUCOSE", "BUN", "CREATININE", "CALCIUM ", "PHOS" in the last 168 hours.  Invalid input(s): "ALB" No results for input(s): "WBC", "NEUTROABS", "HGB", "HCT", "MCV", "PLT" in the last 168 hours. Liver Function Tests: No results for input(s): "AST", "ALT", "ALKPHOS", "BILITOT", "PROT", "ALBUMIN " in the last 168 hours. No results for input(s): "LIPASE", "AMYLASE" in the last 168 hours. No results for input(s): "AMMONIA" in the last 168 hours. Cardiac Enzymes: No results for input(s): "CKTOTAL", "CKMB", "CKMBINDEX", "TROPONINI" in the last 168 hours. Iron Studies: No results for input(s): "IRON", "TIBC", "TRANSFERRIN", "FERRITIN" in the last 72 hours. PT/INR: @LABRCNTIP (inr:5)  Xrays/Other Studies: )No results found for this or any previous visit (from the past 48 hours). No results found.  PMH:   Past Medical History:  Diagnosis Date   CHF (congestive heart failure) (HCC)    Chronic  kidney disease    Diabetes mellitus without complication (HCC)    PATIENT JUST LEARNED HE WAS DIABETIC   Hepatitis    Hyperlipemia    Hypertension    Necrotizing fasciitis (HCC)    Pneumonia    HX OF PNA    PSH:   Past Surgical History:  Procedure Laterality Date   AMPUTATION Left 09/11/2013   Procedure: AMPUTATION BELOW KNEE;  Surgeon: Edison Gore, MD;  Location: [facility] OR;  Service: Orthopedics;  Laterality: Left;   AMPUTATION Right 12/18/2013   Procedure: RIGHT FOOT 1,2, TOE AMPUTATION  5th toe RAY AMPUTATION;  Surgeon: Edison Gore, MD;  Location: [facility] OR;  Service: Orthopedics;  Laterality: Right;   AV FISTULA PLACEMENT Left 12/11/2019   Procedure: LEFT BRACHIOCEPHALIC FISTULA CREATION;  Surgeon: Mayo Speck,  MD;  Location: [facility] OR;  Service: Vascular;  Laterality: Left;   BIOPSY  07/19/2023   Procedure: BIOPSY;  Surgeon: Nannette Babe, MD;  Location: [facility] ENDOSCOPY;  Service: Gastroenterology;;   CHOLECYSTECTOMY     ESOPHAGOGASTRODUODENOSCOPY (EGD) WITH PROPOFOL  N/A 07/19/2023   Procedure: ESOPHAGOGASTRODUODENOSCOPY (EGD) WITH PROPOFOL ;  Surgeon: Nannette Babe, MD;  Location: [facility] ENDOSCOPY;  Service: Gastroenterology;  Laterality: N/A;   HOT HEMOSTASIS N/A 07/19/2023   Procedure: HOT HEMOSTASIS (ARGON PLASMA COAGULATION/BICAP);  Surgeon: Nannette Babe, MD;  Location: [facility] ENDOSCOPY;  Service: Gastroenterology;  Laterality: N/A;  stomach   I & D EXTREMITY Left 09/03/2013   Procedure: IRRIGATION AND DEBRIDEMENT EXTREMITY;  Surgeon: Edison Gore, MD;  Location: [facility] OR;  Service: Orthopedics;  Laterality: Left;   I & D EXTREMITY Left 09/11/2013   Procedure: LEFT FOOT IRRIGATION AND DEBRIDEMENT;  Surgeon: Edison Gore, MD;  Location: [facility] OR;  Service: Orthopedics;  Laterality: Left;   INSERTION OF DIALYSIS CATHETER  12/11/2019   Procedure: Insertion Of Dialysis Catheter;  Surgeon: Mayo Speck, MD;  Location: [facility] OR;  Service: Vascular;;   INSERTION OF DIALYSIS CATHETER Right  07/11/2022   Procedure: INSERTION OF TUNNELED DIALYSIS CATHETER;  Surgeon: Dannis Dy, MD;  Location: [facility] OR;  Service: Vascular;  Laterality: Right;   LEFT HEART CATH AND CORONARY ANGIOGRAPHY N/A 12/13/2019   Procedure: LEFT HEART CATH AND CORONARY ANGIOGRAPHY;  Surgeon: Lucendia Rusk, MD;  Location: [facility] INVASIVE CV LAB;  Service: Cardiovascular;  Laterality: N/A;   REVISON OF ARTERIOVENOUS FISTULA Left 07/11/2022   Procedure: REVISON OF LEFT ARM ARTERIOVENOUS FISTULA;  Surgeon: Dannis Dy, MD;  Location: [facility] OR;  Service: Vascular;  Laterality: Left;   RIGHT HEART CATH N/A 12/13/2019   Procedure: RIGHT HEART CATH;  Surgeon: Lucendia Rusk, MD;  Location: [facility] INVASIVE CV LAB;  Service: Cardiovascular;  Laterality: N/A;    Allergies:  Allergies  Allergen Reactions   Tramadol  Nausea And Vomiting    Pt states he took this on an empty stomach one time and it came right back up. He's not had it since.     Medications:   Prior to Admission medications   Medication Sig Start Date End Date Taking? Authorizing Provider  amiodarone  (PACERONE ) 200 MG tablet Take 1 tablet (200 mg total) by mouth daily. 10/15/22 07/17/23  McDiarmid, Demetra Filter, MD  apixaban  (ELIQUIS ) 2.5 MG TABS tablet Take 1 tablet (2.5 mg total) by mouth 2 (two) times daily. 07/21/23   Luna Salinas, MD  atorvastatin  (LIPITOR) 20 MG tablet Take 1 tablet (20 mg total) by mouth daily. 12/17/19   Melodye Spurr, MD  bethanechol  (URECHOLINE ) 10 MG tablet Take 5 mg by mouth 3 (three) times daily.    [provider]  calcitRIOL  (ROCALTROL ) 0.25 MCG capsule Take 1 capsule (0.25 mcg total) by mouth Every Tuesday,Thursday,and Saturday with dialysis. 07/22/23   Amin, Sumayya, MD  ferric citrate (AURYXIA) 1 GM 210 MG(Fe) tablet Take 210 mg by mouth. 07/29/23   [provider]  ferrous sulfate  325 (65 FE) MG tablet Take 325 mg by mouth daily with breakfast.    [provider]  metoprolol   succinate (TOPROL -XL) 25 MG 24 hr tablet Take 12.5 mg by mouth 3 (three) times a week. 12/20/22   [provider]  pantoprazole  (PROTONIX ) 40 MG tablet Take 1 tablet (40 mg total) by mouth 2 (two) times daily before a meal. 07/21/23   Luna Salinas, MD  PROMETHAZINE  HCL  IM Inject 25 mg into the muscle every 4 (four) hours as needed (Nausea and Vomiting).    [provider]  tamsulosin  (FLOMAX ) 0.4 MG CAPS capsule Take 0.4 mg by mouth See admin instructions. Take one tablet by mouth 3 times a week on Monday, Wednesday and Fridays 12/20/22   [provider]    Discontinued Meds:  There are no discontinued medications.  Social History:  reports that he has never smoked. He has never used smokeless tobacco. He reports that he does not drink alcohol and does not use drugs.  Family History:   Family History  Problem Relation Age of Onset   Diabetes type II Mother    Dementia Mother    Heart disease Father     There were no vitals taken for this visit. Gen: NAD CVS: irreg irreg Resp: diminished air entry bibasilar, normal WOB Abd: soft Ext: left bka, no edema Neuro: awake, alert Dialysis access: LUE AVF +b/t, aneurysmal with a small scab        Zeola Brys, Alveda Aures, MD 08/16/2023, 9:15 AM

## 2023-08-16 NOTE — Op Note (Signed)
 Left upper arm brachiocephalic fistula placed Dec 11, 2019 by Dr. Shirley Douglas with plication of aneurysm revision by Dr. Kermit Ped on 07/11/2022.  Patient had a 8 x 5 Viabahn placed at the arch on Dec 06, 2021 after very high-pressure angioplasty.  Patient's last outflow angioplasty with a 8 mm balloon was on August 26, 2022.  Patient is now presenting with prolonged bleeding and decreased access flows.  On examination, the brachial cephalic fistula is hyperpulsatile with an aneurysmal body and a poor thrill in the outflow.  Will scab over the aneurysm.   Summary:  1)      The patient had successful angioplasty (9x4 Athletis FE ~28 atm) of significant stenosis in the cephalic vein arch involving the confluence at both ends of the stent.  2)      60% outflow cephalic vein stenosis with a pseudoaneurysm present at that level as well treated with a 9 x 4 F  Athletis FE ~2\0-25 atm. 3) Aneurysmal body of the cephalic vein fistula was patent with good flows inflow and  centrals were widely patent. 4)      This left BCF remains amenable to future percutaneous intervention.  Description of procedure: The arm was prepped and draped in the usual sterile fashion. The left upper arm brachial cephalic fistula was cannulated (16109) with an 18G Angiocath needle directed in an antegrade direction. A guidewire was inserted and exchanged for a 6Fr and later 7Fr sheath. Contrast [phone] injection via the side port of the sheath was performed. The angiogram of the fistula (09811) showed an aneurysmal body of the left BCF,  50-70% outflow cephalic vein stenosis with a pseudoaneurysm present in the middle of that interval and an 80% cephalic vein arch stenosis both ends of the stent graft involving the confluence. The inflow anastomosis and left centrals were patent.  The wire was advanced centrally without any difficulty. A 9x4 Athletis angioplasty balloon was inserted over a glide wire and positioned at the cephalic vein arch  stenosis both ends of the stent graft. Venous angioplasty (91478) was carried out to 24-28 ATM with FULL effacement of the waist on the balloon at the arch lesion.  He then retracted the  9x4 Athletis angioplasty balloon to the flow cephalic vein stenosis. Venous angioplasty (29562) was carried out to 24-28 ATM with FULL effacement of the waist on the balloon.  Repeat angiogram showed 10% residual at all levels of angioplasty with no evidence of extravasation.  The flow of contrast was quicker and the fistula was markedly less pulsatile.  Interestingly the contrast was more faint along the outflow cephalic vein and darker at the arch stent but I did not see any extravasation or significant residual stenosis.  Contrast likely moving very rapidly and being diluted given that the outflow cephalic vein stenosis is very close to the edge of the sheath.  Hemostasis: A 3-0 ethilon purse string suture was placed at the cannulation site on removal of the sheath.  Sedation: 1 mg Versed , 25 mcg Fentanyl . Sedation time. 31 minutes  Contrast. 15 mL  Monitoring: Because of the patient's comorbid conditions and sedation during the procedure, continuous EKG monitoring and O2 saturation monitoring was performed throughout the procedure by the RN. There were no abnormal arrhythmias encountered.  Complications: None.   Diagnoses: I87.1 Stricture of vein  N18.6 ESRD T82.858A Stricture of access  Procedure Coding:  [phone] Cannulation and angiogram of fistula, venous angioplasty (cephalic vein arch) [id number] Contrast  Recommendations:  1. Continue to cannulate the  fistula with 15G needles.  2. Refer back for problems with flows. 3. Remove the suture next treatment.   Discharge: The patient was discharged home in stable condition. The patient was given education regarding the care of the dialysis access AVF and specific instructions in case of any problems.

## 2023-08-17 ENCOUNTER — Encounter (HOSPITAL_COMMUNITY): Payer: Self-pay | Admitting: Nephrology

## 2023-08-24 ENCOUNTER — Inpatient Hospital Stay (HOSPITAL_COMMUNITY): Payer: Medicare Other

## 2023-08-24 ENCOUNTER — Encounter (HOSPITAL_COMMUNITY): Payer: Self-pay

## 2023-08-24 ENCOUNTER — Emergency Department (HOSPITAL_COMMUNITY): Payer: Medicare Other

## 2023-08-24 ENCOUNTER — Inpatient Hospital Stay (HOSPITAL_COMMUNITY)
Admission: EM | Admit: 2023-08-24 | Discharge: 2023-08-29 | DRG: 871 | Disposition: A | Discharge status: Skilled Nursing Facility | Payer: Medicare Other | Attending: Internal Medicine | Admitting: Internal Medicine

## 2023-08-24 DIAGNOSIS — Z1152 Encounter for screening for COVID-19: Secondary | ICD-10-CM | POA: Diagnosis not present

## 2023-08-24 DIAGNOSIS — E1151 Type 2 diabetes mellitus with diabetic peripheral angiopathy without gangrene: Secondary | ICD-10-CM | POA: Diagnosis present

## 2023-08-24 DIAGNOSIS — D631 Anemia in chronic kidney disease: Secondary | ICD-10-CM | POA: Diagnosis present

## 2023-08-24 DIAGNOSIS — L89502 Pressure ulcer of unspecified ankle, stage 2: Secondary | ICD-10-CM | POA: Diagnosis present

## 2023-08-24 DIAGNOSIS — E11621 Type 2 diabetes mellitus with foot ulcer: Secondary | ICD-10-CM | POA: Diagnosis present

## 2023-08-24 DIAGNOSIS — G9340 Encephalopathy, unspecified: Secondary | ICD-10-CM

## 2023-08-24 DIAGNOSIS — Z89512 Acquired absence of left leg below knee: Secondary | ICD-10-CM | POA: Diagnosis not present

## 2023-08-24 DIAGNOSIS — Z992 Dependence on renal dialysis: Secondary | ICD-10-CM | POA: Diagnosis not present

## 2023-08-24 DIAGNOSIS — L97419 Non-pressure chronic ulcer of right heel and midfoot with unspecified severity: Secondary | ICD-10-CM | POA: Diagnosis present

## 2023-08-24 DIAGNOSIS — A419 Sepsis, unspecified organism: Secondary | ICD-10-CM | POA: Diagnosis present

## 2023-08-24 DIAGNOSIS — R578 Other shock: Secondary | ICD-10-CM | POA: Diagnosis not present

## 2023-08-24 DIAGNOSIS — I4891 Unspecified atrial fibrillation: Secondary | ICD-10-CM | POA: Diagnosis present

## 2023-08-24 DIAGNOSIS — R571 Hypovolemic shock: Secondary | ICD-10-CM | POA: Diagnosis present

## 2023-08-24 DIAGNOSIS — I5022 Chronic systolic (congestive) heart failure: Secondary | ICD-10-CM | POA: Diagnosis present

## 2023-08-24 DIAGNOSIS — J189 Pneumonia, unspecified organism: Secondary | ICD-10-CM | POA: Diagnosis present

## 2023-08-24 DIAGNOSIS — E872 Acidosis, unspecified: Secondary | ICD-10-CM

## 2023-08-24 DIAGNOSIS — Z8701 Personal history of pneumonia (recurrent): Secondary | ICD-10-CM

## 2023-08-24 DIAGNOSIS — R6521 Severe sepsis with septic shock: Secondary | ICD-10-CM | POA: Diagnosis present

## 2023-08-24 DIAGNOSIS — L89153 Pressure ulcer of sacral region, stage 3: Secondary | ICD-10-CM | POA: Diagnosis present

## 2023-08-24 DIAGNOSIS — G9341 Metabolic encephalopathy: Secondary | ICD-10-CM | POA: Diagnosis present

## 2023-08-24 DIAGNOSIS — I132 Hypertensive heart and chronic kidney disease with heart failure and with stage 5 chronic kidney disease, or end stage renal disease: Secondary | ICD-10-CM | POA: Diagnosis present

## 2023-08-24 DIAGNOSIS — E785 Hyperlipidemia, unspecified: Secondary | ICD-10-CM | POA: Diagnosis present

## 2023-08-24 DIAGNOSIS — N186 End stage renal disease: Secondary | ICD-10-CM | POA: Diagnosis present

## 2023-08-24 DIAGNOSIS — Z7901 Long term (current) use of anticoagulants: Secondary | ICD-10-CM

## 2023-08-24 DIAGNOSIS — Z8249 Family history of ischemic heart disease and other diseases of the circulatory system: Secondary | ICD-10-CM

## 2023-08-24 DIAGNOSIS — R579 Shock, unspecified: Secondary | ICD-10-CM

## 2023-08-24 DIAGNOSIS — K922 Gastrointestinal hemorrhage, unspecified: Secondary | ICD-10-CM | POA: Diagnosis present

## 2023-08-24 DIAGNOSIS — E1122 Type 2 diabetes mellitus with diabetic chronic kidney disease: Secondary | ICD-10-CM | POA: Diagnosis present

## 2023-08-24 DIAGNOSIS — Z79899 Other long term (current) drug therapy: Secondary | ICD-10-CM

## 2023-08-24 DIAGNOSIS — L89322 Pressure ulcer of left buttock, stage 2: Secondary | ICD-10-CM | POA: Diagnosis present

## 2023-08-24 DIAGNOSIS — Z833 Family history of diabetes mellitus: Secondary | ICD-10-CM

## 2023-08-24 DIAGNOSIS — N2581 Secondary hyperparathyroidism of renal origin: Secondary | ICD-10-CM | POA: Diagnosis present

## 2023-08-24 DIAGNOSIS — K31819 Angiodysplasia of stomach and duodenum without bleeding: Secondary | ICD-10-CM | POA: Diagnosis present

## 2023-08-24 HISTORY — DX: End stage renal disease: N18.6

## 2023-08-24 LAB — CBC WITH DIFFERENTIAL/PLATELET
Abs Immature Granulocytes: 0.1 10*3/uL — ABNORMAL HIGH (ref 0.00–0.07)
Basophils Absolute: 0 10*3/uL (ref 0.0–0.1)
Basophils Relative: 0 %
Eosinophils Absolute: 0 10*3/uL (ref 0.0–0.5)
Eosinophils Relative: 0 %
HCT: 29.3 % — ABNORMAL LOW (ref 39.0–52.0)
Hemoglobin: 8.7 g/dL — ABNORMAL LOW (ref 13.0–17.0)
Immature Granulocytes: 1 %
Lymphocytes Relative: 1 %
Lymphs Abs: 0.1 10*3/uL — ABNORMAL LOW (ref 0.7–4.0)
MCH: 32.5 pg (ref 26.0–34.0)
MCHC: 29.7 g/dL — ABNORMAL LOW (ref 30.0–36.0)
MCV: 109.3 fL — ABNORMAL HIGH (ref 80.0–100.0)
Monocytes Absolute: 0.3 10*3/uL (ref 0.1–1.0)
Monocytes Relative: 2 %
Neutro Abs: 12 10*3/uL — ABNORMAL HIGH (ref 1.7–7.7)
Neutrophils Relative %: 96 %
Platelets: 187 10*3/uL (ref 150–400)
RBC: 2.68 MIL/uL — ABNORMAL LOW (ref 4.22–5.81)
RDW: 16.5 % — ABNORMAL HIGH (ref 11.5–15.5)
WBC: 12.5 10*3/uL — ABNORMAL HIGH (ref 4.0–10.5)
nRBC: 0.6 % — ABNORMAL HIGH (ref 0.0–0.2)

## 2023-08-24 LAB — RESP PANEL BY RT-PCR (RSV, FLU A&B, COVID)  RVPGX2
Influenza A by PCR: NEGATIVE
Influenza B by PCR: NEGATIVE
Resp Syncytial Virus by PCR: NEGATIVE
SARS Coronavirus 2 by RT PCR: NEGATIVE

## 2023-08-24 LAB — COMPREHENSIVE METABOLIC PANEL
ALT: 27 U/L (ref 0–44)
AST: 32 U/L (ref 15–41)
Albumin: 2.7 g/dL — ABNORMAL LOW (ref 3.5–5.0)
Alkaline Phosphatase: 104 U/L (ref 38–126)
Anion gap: 16 — ABNORMAL HIGH (ref 5–15)
BUN: 23 mg/dL (ref 8–23)
CO2: 27 mmol/L (ref 22–32)
Calcium: 8.5 mg/dL — ABNORMAL LOW (ref 8.9–10.3)
Chloride: 94 mmol/L — ABNORMAL LOW (ref 98–111)
Creatinine, Ser: 2.33 mg/dL — ABNORMAL HIGH (ref 0.61–1.24)
GFR, Estimated: 30 mL/min — ABNORMAL LOW (ref >60)
Glucose, Bld: 198 mg/dL — ABNORMAL HIGH (ref 70–99)
Potassium: 4.5 mmol/L (ref 3.5–5.1)
Sodium: 137 mmol/L (ref 135–145)
Total Bilirubin: 0.7 mg/dL (ref 0.0–1.2)
Total Protein: 6.8 g/dL (ref 6.5–8.1)

## 2023-08-24 LAB — PROCALCITONIN: Procalcitonin: 6.45 ng/mL

## 2023-08-24 LAB — GLUCOSE, CAPILLARY
Glucose-Capillary: 150 mg/dL — ABNORMAL HIGH (ref 70–99)
Glucose-Capillary: 194 mg/dL — ABNORMAL HIGH (ref 70–99)

## 2023-08-24 LAB — I-STAT CG4 LACTIC ACID, ED
Lactic Acid, Venous: 2.3 mmol/L (ref 0.5–1.9)
Lactic Acid, Venous: 2.9 mmol/L (ref 0.5–1.9)

## 2023-08-24 LAB — HEMOGLOBIN A1C
Hgb A1c MFr Bld: 4 % — ABNORMAL LOW (ref 4.8–5.6)
Mean Plasma Glucose: 68.1 mg/dL

## 2023-08-24 LAB — CBG MONITORING, ED: Glucose-Capillary: 212 mg/dL — ABNORMAL HIGH (ref 70–99)

## 2023-08-24 LAB — LIPASE, BLOOD: Lipase: 46 U/L (ref 11–51)

## 2023-08-24 LAB — C DIFFICILE QUICK SCREEN W PCR REFLEX
C Diff antigen: NEGATIVE
C Diff interpretation: NOT DETECTED
C Diff toxin: NEGATIVE

## 2023-08-24 LAB — LACTIC ACID, PLASMA: Lactic Acid, Venous: 3.3 mmol/L (ref 0.5–1.9)

## 2023-08-24 LAB — BRAIN NATRIURETIC PEPTIDE: B Natriuretic Peptide: 809.2 pg/mL — ABNORMAL HIGH (ref 0.0–100.0)

## 2023-08-24 MED ORDER — VANCOMYCIN HCL 750 MG/150ML IV SOLN: 750.0000 mg | Freq: Once | INTRAVENOUS | Status: DC

## 2023-08-24 MED ORDER — SODIUM CHLORIDE 0.9 % IV SOLN
2.0000 g | Freq: Once | INTRAVENOUS | Status: AC
  Administered 2023-08-24: 2 g via INTRAVENOUS
  Filled 2023-08-24: qty 12.5

## 2023-08-24 MED ORDER — LACTATED RINGERS IV BOLUS
500.0000 mL | Freq: Once | INTRAVENOUS | Status: AC
  Administered 2023-08-24: 500 mL via INTRAVENOUS

## 2023-08-24 MED ORDER — ALBUMIN HUMAN 5 % IV SOLN
25.0000 g | Freq: Once | INTRAVENOUS | Status: AC
  Administered 2023-08-24: 25 g via INTRAVENOUS
  Filled 2023-08-24 (×2): qty 500

## 2023-08-24 MED ORDER — AMIODARONE HCL 200 MG PO TABS
200.0000 mg | ORAL_TABLET | Freq: Every day | ORAL | Status: DC
  Administered 2023-08-25 – 2023-08-29 (×5): 200 mg via ORAL
  Filled 2023-08-24 (×5): qty 1

## 2023-08-24 MED ORDER — PIPERACILLIN-TAZOBACTAM IN DEX 2-0.25 GM/50ML IV SOLN
2.2500 g | Freq: Three times a day (TID) | INTRAVENOUS | Status: DC
  Filled 2023-08-24: qty 50

## 2023-08-24 MED ORDER — ATORVASTATIN CALCIUM 10 MG PO TABS
20.0000 mg | ORAL_TABLET | Freq: Every day | ORAL | Status: DC
  Administered 2023-08-25 – 2023-08-29 (×5): 20 mg via ORAL
  Filled 2023-08-24 (×5): qty 2

## 2023-08-24 MED ORDER — POLYETHYLENE GLYCOL 3350 17 G PO PACK: 17.0000 g | PACK | Freq: Every day | ORAL | Status: DC | PRN

## 2023-08-24 MED ORDER — MIDODRINE HCL 5 MG PO TABS
5.0000 mg | ORAL_TABLET | Freq: Three times a day (TID) | ORAL | Status: DC
  Administered 2023-08-24 – 2023-08-27 (×8): 5 mg via ORAL
  Filled 2023-08-24 (×8): qty 1

## 2023-08-24 MED ORDER — INSULIN ASPART 100 UNIT/ML IJ SOLN
0.0000 [IU] | INTRAMUSCULAR | Status: DC
  Administered 2023-08-24: 3 [IU] via SUBCUTANEOUS
  Administered 2023-08-25 – 2023-08-29 (×8): 1 [IU] via SUBCUTANEOUS

## 2023-08-24 MED ORDER — PANTOPRAZOLE SODIUM 40 MG PO TBEC
40.0000 mg | DELAYED_RELEASE_TABLET | Freq: Two times a day (BID) | ORAL | Status: DC
Start: 2023-08-25 — End: 2023-08-30
  Administered 2023-08-25 – 2023-08-29 (×9): 40 mg via ORAL
  Filled 2023-08-24 (×9): qty 1

## 2023-08-24 MED ORDER — LACTATED RINGERS IV BOLUS
1000.0000 mL | Freq: Once | INTRAVENOUS | Status: AC
  Administered 2023-08-24: 1000 mL via INTRAVENOUS

## 2023-08-24 MED ORDER — CHLORHEXIDINE GLUCONATE CLOTH 2 % EX PADS
6.0000 | MEDICATED_PAD | Freq: Every day | CUTANEOUS | Status: DC
  Administered 2023-08-25 – 2023-08-29 (×4): 6 via TOPICAL

## 2023-08-24 MED ORDER — SODIUM CHLORIDE 0.9 % IV SOLN
250.0000 mL | INTRAVENOUS | Status: AC
  Administered 2023-08-24: 250 mL via INTRAVENOUS

## 2023-08-24 MED ORDER — APIXABAN 2.5 MG PO TABS
2.5000 mg | ORAL_TABLET | Freq: Two times a day (BID) | ORAL | Status: AC
Start: 2023-08-25 — End: ?
  Administered 2023-08-25 – 2023-08-29 (×9): 2.5 mg via ORAL
  Filled 2023-08-24 (×9): qty 1

## 2023-08-24 MED ORDER — VANCOMYCIN HCL IN DEXTROSE 1-5 GM/200ML-% IV SOLN
1000.0000 mg | Freq: Once | INTRAVENOUS | Status: AC
  Administered 2023-08-24: 1000 mg via INTRAVENOUS
  Filled 2023-08-24: qty 200

## 2023-08-24 MED ORDER — ONDANSETRON HCL 4 MG/2ML IJ SOLN
4.0000 mg | Freq: Once | INTRAMUSCULAR | Status: DC
  Filled 2023-08-24: qty 2

## 2023-08-24 MED ORDER — ACETAMINOPHEN 500 MG PO TABS
1000.0000 mg | ORAL_TABLET | Freq: Once | ORAL | Status: AC
  Administered 2023-08-24: 1000 mg via ORAL
  Filled 2023-08-24: qty 2

## 2023-08-24 MED ORDER — CALCITRIOL 0.25 MCG PO CAPS
0.2500 ug | ORAL_CAPSULE | ORAL | Status: DC
  Administered 2023-08-26 – 2023-08-29 (×2): 0.25 ug via ORAL
  Filled 2023-08-24 (×3): qty 1

## 2023-08-24 MED ORDER — DOCUSATE SODIUM 100 MG PO CAPS: 100.0000 mg | ORAL_CAPSULE | Freq: Two times a day (BID) | ORAL | Status: DC | PRN

## 2023-08-24 MED ORDER — NOREPINEPHRINE 4 MG/250ML-% IV SOLN
2.0000 ug/min | INTRAVENOUS | Status: DC
  Administered 2023-08-24: 2 ug/min via INTRAVENOUS
  Filled 2023-08-24: qty 250

## 2023-08-24 NOTE — H&P (Addendum)
NAME:  [name], MRN:  [id number], DOB:  1955/09/29, LOS: 0 ADMISSION DATE:  08/24/2023, CONSULTATION DATE:  08/24/23 REFERRING MD:  Suezanne Jacquet , CHIEF COMPLAINT:  possible emesis  // hypotension  History of Present Illness:  68 yo M who is SNF resident w PMH ESRD TTS iHD, DM2, GIB, Afib on eliquis (2.5 instead of 5 w hx GIB), s/p R toe amputations and L BKA who presented to [facility] ED 1/23 from dialysis  for reported emesis -- 5-10 bouts of nonbloody, nonbilious emesis. He reportedly did not undergo HD. When pt arrived to ED, it was reported to ED RN that PTA pt had watery diarrhea, though in ED he has had soft stools but not diarrhea.  In ED he had a slightly elevated temp to 100.4, mild leukocytosis 12.5 and had softening BP. Started on abx for sepsis. Received  1000cc but MAPs dropped < 65 and he was started on peripheral NE. Noted to also have uptrending LA.   PCCM is called for admission in this setting.   He seems to be a poor historian as his hx to me differs from report to ED provider. For me, he denies n/v/d. He endorses cough "for a good while" and endorses multiple sick contacts at the SNF, but isn't sure what they are sick with. He feels good right now and isnt sure why he is here.   Pertinent  Medical History  DM2 ESRD on HD GIB Afib Chronic AC   Significant Hospital Events: Including procedures, antibiotic start and stop dates in addition to other pertinent events   To ED from HD 2/2 emesis. Admit to ICU on 2 NE   Interim History / Subjective:  Started on periph NE   Objective   Blood pressure (!) 99/50, pulse 97, temperature 99.8 F (37.7 C), temperature source Oral, resp. rate (!) 32, height 5\' 3"  (1.6 m), weight 53.8 kg, SpO2 95%.        Intake/Output Summary (Last 24 hours) at 08/24/2023 2150 Last data filed at 08/24/2023 2041 Gross per 24 hour  Intake 1300 ml  Output --  Net 1300 ml   Filed Weights   08/24/23 1524  Weight: 53.8 kg    Examination: General:  chronically ill M who appears older than stated age NAD  HENT: Poor dentition. Pink mm  Lungs: Symmetrical chest expansion. Upper rhonchi improved after coughing fit.  Cardiovascular: tachycardic, regular. S1s2 + systolic murmur, cap refill is 3 seconds  Abdomen: soft ndnt  Extremities: L BKA multiple R toe amputations. LUE fistula  Skin: dry, flaking. Amputation sites are healed. Unable to assess posteriorly, refused turning. R heel sore Neuro: Lethargic but awakens and is Oriented x 2-3 Following commands  GU: defer  Resolved Hospital Problem list     Assessment & Plan:   Acute encephalopathy -sepsis, hypoperfusion  P -tx underlying cause, supportive care -minimize CNS depressing meds -delirium precautions   Shock, presumed septic, possible hypovolemia ?n/v/d -CXR w maybe some LLL consolidation but could just be atelectasis. Covid neg. Cough may be chronic (he can't give a great hx)  -certainly bacteremia risk. Does make urine so also consider UTI  -hgb is at/above baseline, not bleeding -I presume report of emesis PTA is correct, though he denies. Difficult to say re diarrhea given stool consistency here being soft not watery. Is not having either currently.  Lipase LFTs Bili all WNL  -does have a murmur which might be new based on prior notes. No cardiac sx, no  edema. EKG is non-acute.  P -periph NE for SBP > 90 -vanc zosyn -- if MRSA PCR neg, de-escalate vanc  -Bcx, UA (says he still makes some urine), GI panel, RVP & trach asp  -albumin + another L of LR -- over 4hrs  -will start midodrine  -check a BNP, echo tomorrow -Follow CBC fever curve -will try clears. If he has n/v de-escalate to sips w meds  -PRN antiemetic   Lactic acidosis -in setting of above P -trend   ESRD on TTS HD P -consult nephro in AM   Hx Afib on eliquis Hx HTN Hx HF recovered EF  P -cont PTA eliquis (reduced dose 2/2 recent GIB) -cont home amio  -holding home antihypertensives  -as  above, echo   Dm2 P -SSI  Hx GIB Chronic anemia  P -AM CBC  -BID PPI  R heel ulcer, POA Reported sacral pressure wound (stg 1?) POA -WOC, turn q2hr   -he would not allow me to turn him and would not allow me to really evaluate the heel. Could catch a glimpse only.   Best Practice (right click and "Reselect all SmartList Selections" daily)   Diet/type: clear liquids DVT prophylaxis DOAC Pressure ulcer(s): pressure ulcer assessment deferred  // POA In ED provider note it sounds like he has some redness on his sacrum without breakdown, but he would not allow me to assess.  He does have a sore on his R heel, POA  but I could only see this for a second before he kicked me off and refused assessment further  GI prophylaxis: PPI Lines: N/A Foley:  N/A Code Status:  full code --discussed in ED 1/23, ED RN present. With question of encephalopathy, I also reviewed priors and they have also been full code  Last date of multidisciplinary goals of care discussion [1/23] He asked that I update his friend Babette Relic, not his sister Labs   CBC: Recent Labs  Lab 08/24/23 1527  WBC 12.5*  NEUTROABS 12.0*  HGB 8.7*  HCT 29.3*  MCV 109.3*  PLT 187    Basic Metabolic Panel: Recent Labs  Lab 08/24/23 1527  NA 137  K 4.5  CL 94*  CO2 27  GLUCOSE 198*  BUN 23  CREATININE 2.33*  CALCIUM 8.5*   GFR: Estimated Creatinine Clearance: 24.1 mL/min (A) (by C-G formula based on SCr of 2.33 mg/dL (H)). Recent Labs  Lab 08/24/23 1527 08/24/23 1925 08/24/23 2031  WBC 12.5*  --   --   LATICACIDVEN  --  2.3* 2.9*    Liver Function Tests: Recent Labs  Lab 08/24/23 1527  AST 32  ALT 27  ALKPHOS 104  BILITOT 0.7  PROT 6.8  ALBUMIN 2.7*   Recent Labs  Lab 08/24/23 1527  LIPASE 46   No results for input(s): "AMMONIA" in the last 168 hours.  ABG    Component Value Date/Time   HCO3 25.9 09/23/2022 1653   TCO2 26 07/11/2022 0907   ACIDBASEDEF 2.0 12/13/2019 1509   O2SAT 72.2  09/23/2022 1653     Coagulation Profile: No results for input(s): "INR", "PROTIME" in the last 168 hours.  Cardiac Enzymes: No results for input(s): "CKTOTAL", "CKMB", "CKMBINDEX", "TROPONINI" in the last 168 hours.  HbA1C: Hgb A1c MFr Bld  Date/Time Value Ref Range Status  09/23/2022 01:26 AM 5.6 4.8 - 5.6 % Final    Comment:    (NOTE) Pre diabetes:          5.7%-6.4%  Diabetes:              >  6.4%  Glycemic control for   <7.0% adults with diabetes   11/29/2019 04:26 AM 5.5 4.8 - 5.6 % Final    Comment:    (NOTE) Pre diabetes:          5.7%-6.4% Diabetes:              >6.4% Glycemic control for   <7.0% adults with diabetes     CBG: No results for input(s): "GLUCAP" in the last 168 hours.  Review of Systems:   Review of Systems  Constitutional:  Positive for malaise/fatigue. Negative for chills.  HENT:  Negative for congestion, sinus pain and sore throat.   Respiratory:  Positive for cough. Negative for shortness of breath.   Cardiovascular: Negative.   Gastrointestinal:  Negative for blood in stool, diarrhea, melena, nausea and vomiting.  Genitourinary:  Negative for dysuria.  Musculoskeletal: Negative.   Skin: Negative.   Neurological: Negative.   Endo/Heme/Allergies: Negative.   Psychiatric/Behavioral:  Negative for substance abuse.      Past Medical History:  He,  has a past medical history of CHF (congestive heart failure) (HCC), Chronic kidney disease, Diabetes mellitus without complication (HCC), Hepatitis, Hyperlipemia, Hypertension, Necrotizing fasciitis (HCC), and Pneumonia.   Surgical History:   Past Surgical History:  Procedure Laterality Date   A/V FISTULAGRAM N/A 08/16/2023   Procedure: A/V Fistulagram;  Surgeon: Ethelene Hal, MD;  Location: [facility] INVASIVE CV LAB;  Service: Cardiovascular;  Laterality: N/A;   AMPUTATION Left 09/11/2013   Procedure: AMPUTATION BELOW KNEE;  Surgeon: Cheral Almas, MD;  Location: [facility] OR;  Service: Orthopedics;   Laterality: Left;   AMPUTATION Right 12/18/2013   Procedure: RIGHT FOOT 1,2, TOE AMPUTATION  5th toe RAY AMPUTATION;  Surgeon: Cheral Almas, MD;  Location: [facility] OR;  Service: Orthopedics;  Laterality: Right;   AV FISTULA PLACEMENT Left 12/11/2019   Procedure: LEFT BRACHIOCEPHALIC FISTULA CREATION;  Surgeon: Larina Earthly, MD;  Location: [facility] OR;  Service: Vascular;  Laterality: Left;   BIOPSY  07/19/2023   Procedure: BIOPSY;  Surgeon: Beverley Fiedler, MD;  Location: [facility] ENDOSCOPY;  Service: Gastroenterology;;   CHOLECYSTECTOMY     ESOPHAGOGASTRODUODENOSCOPY (EGD) WITH PROPOFOL N/A 07/19/2023   Procedure: ESOPHAGOGASTRODUODENOSCOPY (EGD) WITH PROPOFOL;  Surgeon: Beverley Fiedler, MD;  Location: [facility] ENDOSCOPY;  Service: Gastroenterology;  Laterality: N/A;   HOT HEMOSTASIS N/A 07/19/2023   Procedure: HOT HEMOSTASIS (ARGON PLASMA COAGULATION/BICAP);  Surgeon: Beverley Fiedler, MD;  Location: [facility] ENDOSCOPY;  Service: Gastroenterology;  Laterality: N/A;  stomach   I & D EXTREMITY Left 09/03/2013   Procedure: IRRIGATION AND DEBRIDEMENT EXTREMITY;  Surgeon: Cheral Almas, MD;  Location: [facility] OR;  Service: Orthopedics;  Laterality: Left;   I & D EXTREMITY Left 09/11/2013   Procedure: LEFT FOOT IRRIGATION AND DEBRIDEMENT;  Surgeon: Cheral Almas, MD;  Location: [facility] OR;  Service: Orthopedics;  Laterality: Left;   INSERTION OF DIALYSIS CATHETER  12/11/2019   Procedure: Insertion Of Dialysis Catheter;  Surgeon: Larina Earthly, MD;  Location: [facility] OR;  Service: Vascular;;   INSERTION OF DIALYSIS CATHETER Right 07/11/2022   Procedure: INSERTION OF TUNNELED DIALYSIS CATHETER;  Surgeon: Chuck Hint, MD;  Location: [facility] OR;  Service: Vascular;  Laterality: Right;   LEFT HEART CATH AND CORONARY ANGIOGRAPHY N/A 12/13/2019   Procedure: LEFT HEART CATH AND CORONARY ANGIOGRAPHY;  Surgeon: Corky Crafts, MD;  Location: [facility] INVASIVE CV LAB;  Service: Cardiovascular;  Laterality: N/A;   PERIPHERAL VASCULAR BALLOON  ANGIOPLASTY Left 08/16/2023  Procedure: PERIPHERAL VASCULAR BALLOON ANGIOPLASTY;  Surgeon: Ethelene Hal, MD;  Location: [facility] INVASIVE CV LAB;  Service: Cardiovascular;  Laterality: Left;   REVISON OF ARTERIOVENOUS FISTULA Left 07/11/2022   Procedure: REVISON OF LEFT ARM ARTERIOVENOUS FISTULA;  Surgeon: Chuck Hint, MD;  Location: [facility] OR;  Service: Vascular;  Laterality: Left;   RIGHT HEART CATH N/A 12/13/2019   Procedure: RIGHT HEART CATH;  Surgeon: Corky Crafts, MD;  Location: [facility] INVASIVE CV LAB;  Service: Cardiovascular;  Laterality: N/A;     Social History:   reports that he has never smoked. He has never used smokeless tobacco. He reports that he does not drink alcohol and does not use drugs.   Family History:  His family history includes Dementia in his mother; Diabetes type II in his mother; Heart disease in his father.   Allergies Allergies  Allergen Reactions   Tramadol Nausea And Vomiting    Pt states he took this on an empty stomach one time and it came right back up. He's not had it since.      Home Medications  Prior to Admission medications   Medication Sig Start Date End Date Taking? Authorizing Provider  amiodarone (PACERONE) 200 MG tablet Take 1 tablet (200 mg total) by mouth daily. 10/15/22 07/17/23  McDiarmid, Leighton Roach, MD  apixaban (ELIQUIS) 2.5 MG TABS tablet Take 1 tablet (2.5 mg total) by mouth 2 (two) times daily. 07/21/23   Arnetha Courser, MD  atorvastatin (LIPITOR) 20 MG tablet Take 1 tablet (20 mg total) by mouth daily. 12/17/19   Elige Radon, MD  bethanechol (URECHOLINE) 10 MG tablet Take 5 mg by mouth 3 (three) times daily.    [provider]  calcitRIOL (ROCALTROL) 0.25 MCG capsule Take 1 capsule (0.25 mcg total) by mouth Every Tuesday,Thursday,and Saturday with dialysis. 07/22/23   Arnetha Courser, MD  ferric citrate (AURYXIA) 1 GM 210 MG(Fe) tablet Take 210 mg by mouth. 07/29/23   [provider]  ferrous sulfate 325 (65 FE)  MG tablet Take 325 mg by mouth daily with breakfast.    [provider]  metoprolol succinate (TOPROL-XL) 25 MG 24 hr tablet Take 12.5 mg by mouth 3 (three) times a week. 12/20/22   [provider]  pantoprazole (PROTONIX) 40 MG tablet Take 1 tablet (40 mg total) by mouth 2 (two) times daily before a meal. 07/21/23   Arnetha Courser, MD  PROMETHAZINE HCL IM Inject 25 mg into the muscle every 4 (four) hours as needed (Nausea and Vomiting).    [provider]  tamsulosin (FLOMAX) 0.4 MG CAPS capsule Take 0.4 mg by mouth See admin instructions. Take one tablet by mouth 3 times a week on Monday, Wednesday and Fridays 12/20/22   [provider]     Critical care time: 50 min       CRITICAL CARE Performed by: Lanier Clam   Total critical care time: 50 minutes  Critical care time was exclusive of separately billable procedures and treating other patients.  Critical care was necessary to treat or prevent imminent or life-threatening deterioration.  Critical care was time spent personally by me on the following activities: development of treatment plan with patient and/or surrogate as well as nursing, discussions with consultants, evaluation of patient's response to treatment, examination of patient, obtaining history from patient or surrogate, ordering and performing treatments and interventions, ordering and review of laboratory studies, ordering and review of radiographic studies, pulse oximetry and re-evaluation of patient's condition.  Tessie Fass MSN, AGACNP-BC [facility] Pulmonary/Critical Care Medicine Amion for pager  08/24/2023, 9:50 PM

## 2023-08-24 NOTE — ED Triage Notes (Signed)
Pt was at dialysis when pt started having nausea, vomiting and chills. EMS gave 4mg  IVP Zofran with mild relief. Lives at [facility]. Pt still had 1.5 hours left but did not finish dialysis.

## 2023-08-24 NOTE — Progress Notes (Addendum)
eLink Physician-Brief Progress Note Patient Name: [name] DOB: Nov 26, 1955 MRN: [id number]   Date of Service  08/24/2023  HPI/Events of Note  Patient admitted with nausea, vomiting, altered mental status, and hypotension suspicious for evolving sepsis, work up is in progress and patient received fluid resuscitation and pressors, in addition to antibiotics.  eICU Interventions  New Patient Evaluation. Lab results reviewed.        [name] 08/24/2023, 11:18 PM

## 2023-08-24 NOTE — Progress Notes (Addendum)
Pharmacy Antibiotic Note  [name] is a 68 y.o. male for which pharmacy has been consulted for vancomycin and zosyn dosing for sepsis.  Patient with a history of ESRD HD TTS, T2DM, GIB, AF on eliquis, s/p r toe amputation and L BKA. Patient presenting from HD with emesis. Per notes did not undergo HD today. Per CCM note, consulting nephrology in the AM.  WBC 12.5; LA 2.9; T 99.4; HR 97; RR 32 COVID neg / flu neg  Received cefepime 2g and 1g vancomycin in ED 1/23  Plan: Plan for 500mg  vancomycin post-HD once nephrology plan is known (missed session today). Pharmacy to follow. Zosyn 2.25g q8h Monitor WBC, fever, renal function, cultures De-escalate when able F/u Nephrology plan  Height: 5\' 3"  (160 cm) Weight: 53.8 kg (118 lb 9.7 oz) IBW/kg (Calculated) : 56.9  Temp (24hrs), Avg:99.9 F (37.7 C), Min:99.4 F (37.4 C), Max:100.4 F (38 C)  Recent Labs  Lab 08/24/23 1527 08/24/23 1925 08/24/23 2031  WBC 12.5*  --   --   CREATININE 2.33*  --   --   LATICACIDVEN  --  2.3* 2.9*    Estimated Creatinine Clearance: 24.1 mL/min (A) (by C-G formula based on SCr of 2.33 mg/dL (H)).    Allergies  Allergen Reactions   Tramadol Nausea And Vomiting    Pt states he took this on an empty stomach one time and it came right back up. He's not had it since.    Microbiology results: Pending  Thank you for allowing pharmacy to be a part of this patient's care.  Delmar Landau, PharmD, BCPS 08/24/2023 9:43 PM ED Clinical Pharmacist -  [phone]

## 2023-08-24 NOTE — ED Notes (Signed)
Patient repositioned in bed after oxyegn saturation 88% RA. Pt denies any hx of sleep apnea. Denies need for oxygen at SNF. Oxygen saturation improved to 93% without intervention. MD notified.

## 2023-08-24 NOTE — ED Provider Notes (Signed)
[place] EMERGENCY DEPARTMENT AT [facility] Provider Note  MDM   HPI/ROS:  [name] is a 68 y.o. male with pertinent past medical history of ESRD (HD TTS), left BKA, AFib on eliquis, HFrecoveredEF, T2DM  who presents for evaluation of emesis.  Patient reports onset of nonbloody nonbilious emesis this morning, with roughly 5-10 episodes PTA.  He presented to his scheduled dialysis session however they sent him to the ED for evaluation prior to him receiving any dialysis.  He did receive a full session on Tuesday.  He received 4 mg IV Zofran with reported mild relief.  He currently endorses chills and nausea but denies any fevers, chest pain, dyspnea, diarrhea, constipation, abdominal pain, leg swelling or pain.  He does still make about a quart a cup of urine daily and he denies any dysuria or hematuria.  No known sick contacts.  Per chart review, patient was just admitted 12/16 - 07/21/2023 for for anemia.  EGD/colonoscopy showed gastric angiodysplasia with 1 area showing stigmata of recent bleeding, unsuccessful attempt to ligate.  Patient refused colonoscopy.  Home Eliquis was resumed on discharge at a lower dose of 2.5 mg twice daily. He additionally underwent successful angioplasty of significant stenosis of the cephalic vein arch on 08/16/2023.   Physical exam is notable for: - Mild 2/6 systolic murmur -- Abdomen soft, nontender nondistended -- Occasional dry cough -- Dry mucous membranes  On my initial evaluation, patient is:  -Vital signs stable. Patient afebrile, mildly tachycardic, otherwise hemodynamically stable, and non-toxic appearing. -Additional history obtained from review of prior records -Reportedly did not receive any dialysis today, though triage note reports received a partial session with 1.5 hours left -Labs reviewed with mild leukocytosis of 12.5 likely due to vomiting, macrocytic anemia at 8.7 improved from prior, stable creatinine of 2.33, mild  hypochloremia at 94, and mild hyperglycemia at 198.  Added on COVID/flu/RSV testing however given patient overall well-appearing and grossly euvolemic anticipate discharge back to SNF.  Low suspicion for intra-abdominal infection, Fournier's gangrene, UTI based on history and exam.  Interpretations, interventions, and the patient's course of care are documented below.  -Patient noted to have multiple instances of asymptomatic hypoxia to the low 80s that self resolved without intervention. -Chest x-ray subsequently obtained, on my interpretation with possible left lower lobe opacity however on formal radiology interpretation more consistent with atelectasis -EKG reviewed, noted to have sinus tachycardia, normal intervals, no peaked T waves, no acute ischemic changes -Patient subsequently became febrile to 100.4 F, meeting SIRS criteria SIRS criteria with fever, tachycardia, leukocytosis so blood cultures, lactic acid, empiric antibiotics, UA/UCx ordered -Spoke with Dow Adolph with hospitalist team for admission at 1935 -While pending evaluation by hospitalist team, patient was noted to have downtrending blood pressures to the 90s/40s with MAP 55-60.  Lactic acid also mildly elevated at 2.3.  Additional 500 cc IV fluid ordered for a total of 1 L, unable to obtain clear view of IVC on ultrasound however would be sent to administer increased fluids given patient with ESRD and patient did have noticeable B-lines on echo -Will continue to reevaluate whether patient needs to be escalated to higher level of care - 8:30 pm: Patient with persistent hypertension with MAP 56 after additional fluid bolus.  Will initiate Levophed and admit to ICU for further evaluation.    Disposition:  I discussed the case with Dr. Lonzo Candy with the ICU team who graciously agreed to admit the patient to their service for continued care.  Clinical Impression:  1. Sepsis, due to unspecified organism, unspecified whether acute  organ dysfunction present [place])     Rx / DC Orders ED Discharge Orders     None       The plan for this patient was discussed with Dr. Suezanne Jacquet, who voiced agreement and who oversaw evaluation and treatment of this patient.   Clinical Complexity A medically appropriate history, review of systems, and physical exam was performed.  My independent interpretations of EKG, labs, and radiology are documented in the ED course above.   If decision rules were used in this patient's evaluation, they are listed below.   Patient's presentation is most consistent with acute presentation with potential threat to life or bodily function.  Medical Decision Making Amount and/or Complexity of Data Reviewed Labs: ordered. Radiology: ordered.  Risk OTC drugs. Prescription drug management. Decision regarding hospitalization.    HPI/ROS      See MDM section for pertinent HPI and ROS. A complete ROS was performed with pertinent positives/negatives noted above.   Past Medical History:  Diagnosis Date   CHF (congestive heart failure) (HCC)    Chronic kidney disease    Diabetes mellitus without complication (HCC)    PATIENT JUST LEARNED HE WAS DIABETIC   Hepatitis    Hyperlipemia    Hypertension    Necrotizing fasciitis (HCC)    Pneumonia    HX OF PNA    Past Surgical History:  Procedure Laterality Date   A/V FISTULAGRAM N/A 08/16/2023   Procedure: A/V Fistulagram;  Surgeon: Ethelene Hal, MD;  [facility]: [facility] INVASIVE [facility] LAB;  Service: Cardiovascular;  Laterality: N/A;   AMPUTATION Left 09/11/2013   Procedure: AMPUTATION BELOW KNEE;  Surgeon: Cheral Almas, MD;  Location: [facility] OR;  Service: Orthopedics;  Laterality: Left;   AMPUTATION Right 12/18/2013   Procedure: RIGHT FOOT 1,2, TOE AMPUTATION  5th toe RAY AMPUTATION;  Surgeon: Cheral Almas, MD;  Location: [facility] OR;  Service: Orthopedics;  Laterality: Right;   AV FISTULA PLACEMENT Left 12/11/2019   Procedure: LEFT BRACHIOCEPHALIC  FISTULA CREATION;  Surgeon: Larina Earthly, MD;  Location: [facility] OR;  Service: Vascular;  Laterality: Left;   BIOPSY  07/19/2023   Procedure: BIOPSY;  Surgeon: Beverley Fiedler, MD;  Location: [facility] ENDOSCOPY;  Service: Gastroenterology;;   CHOLECYSTECTOMY     ESOPHAGOGASTRODUODENOSCOPY (EGD) WITH PROPOFOL N/A 07/19/2023   Procedure: ESOPHAGOGASTRODUODENOSCOPY (EGD) WITH PROPOFOL;  Surgeon: Beverley Fiedler, MD;  Location: [facility] ENDOSCOPY;  Service: Gastroenterology;  Laterality: N/A;   HOT HEMOSTASIS N/A 07/19/2023   Procedure: HOT HEMOSTASIS (ARGON PLASMA COAGULATION/BICAP);  Surgeon: Beverley Fiedler, MD;  Location: [facility] ENDOSCOPY;  Service: Gastroenterology;  Laterality: N/A;  stomach   I & D EXTREMITY Left 09/03/2013   Procedure: IRRIGATION AND DEBRIDEMENT EXTREMITY;  Surgeon: Cheral Almas, MD;  Location: [facility] OR;  Service: Orthopedics;  Laterality: Left;   I & D EXTREMITY Left 09/11/2013   Procedure: LEFT FOOT IRRIGATION AND DEBRIDEMENT;  Surgeon: Cheral Almas, MD;  Location: [facility] OR;  Service: Orthopedics;  Laterality: Left;   INSERTION OF DIALYSIS CATHETER  12/11/2019   Procedure: Insertion Of Dialysis Catheter;  Surgeon: Larina Earthly, MD;  Location: [facility] OR;  Service: Vascular;;   INSERTION OF DIALYSIS CATHETER Right 07/11/2022   Procedure: INSERTION OF TUNNELED DIALYSIS CATHETER;  Surgeon: Chuck Hint, MD;  Location: [facility] OR;  Service: Vascular;  Laterality: Right;   LEFT HEART CATH AND CORONARY ANGIOGRAPHY N/A 12/13/2019   Procedure: LEFT  HEART CATH AND CORONARY ANGIOGRAPHY;  Surgeon: Corky Crafts, MD;  Location: [facility] INVASIVE CV LAB;  Service: Cardiovascular;  Laterality: N/A;   PERIPHERAL VASCULAR BALLOON ANGIOPLASTY Left 08/16/2023   Procedure: PERIPHERAL VASCULAR BALLOON ANGIOPLASTY;  Surgeon: Ethelene Hal, MD;  [facility];  Service: Cardiovascular;  Laterality: Left;   REVISON OF ARTERIOVENOUS FISTULA Left 07/11/2022   Procedure: REVISON OF LEFT ARM ARTERIOVENOUS  FISTULA;  Surgeon: Chuck Hint, MD;  Location: [facility] OR;  Service: Vascular;  Laterality: Left;   RIGHT HEART CATH N/A 12/13/2019   Procedure: RIGHT HEART CATH;  Surgeon: Corky Crafts, MD;  Location: [facility] INVASIVE CV LAB;  Service: Cardiovascular;  Laterality: N/A;      Physical Exam   Vitals:   08/24/23 1524 08/24/23 1531  BP:  113/83  Pulse:  (!) 102  Resp:  15  Temp:  99.4 F (37.4 C)  TempSrc:  Oral  SpO2:  97%  Weight: 53.8 kg   Height: 5\' 3"  (1.6 m)     Physical Exam Gen: Frail, cachectic appearance HENT: Conjunctiva clear, PERRL, EOMI. MMM.  CV: RRR. No M/R/G Pulm: Lungs CTAB with no wheezing, rales, or rhonchi.  GI: Abdomen soft, non-tender, non-distended. Normal bowel sounds in all 4 quadrants. MSK/Skin: No lower extremity edema. Extremities warm, well-perfused with 2+ pulses in all 4 extremities.  Left upper extremity AV fistula in place. Left BKA.  Mildly erythematous skin over his sacrum with no breakdown present.  No perineal erythema, warmth, crepitus. Neuro: Alert and oriented.  GCS 14, intermittently mildly confused.  Commands   Procedures   If procedures were preformed on this patient, they are listed below:  Procedures   Mikeal Hawthorne, MD Emergency Medicine PGY-2   Please note that this documentation was produced with the assistance of voice-to-text technology and may contain errors.    Mikeal Hawthorne, MD 08/25/23 [id number]    Lonell Grandchild, MD 08/25/23 [phone]

## 2023-08-24 NOTE — ED Notes (Signed)
RN called CCMD for cardiac monitoring.

## 2023-08-24 NOTE — ED Notes (Signed)
Pt had an episode of excessive diarrhea. Cleaned and changed by RN and ED tech. Pt constantly going again. Noted to have watery stools. Will continue to monitor.

## 2023-08-25 ENCOUNTER — Encounter (HOSPITAL_COMMUNITY): Payer: Self-pay | Admitting: Pulmonary Disease

## 2023-08-25 ENCOUNTER — Inpatient Hospital Stay (HOSPITAL_COMMUNITY): Payer: Medicare Other

## 2023-08-25 DIAGNOSIS — R6521 Severe sepsis with septic shock: Secondary | ICD-10-CM

## 2023-08-25 DIAGNOSIS — A419 Sepsis, unspecified organism: Secondary | ICD-10-CM | POA: Diagnosis not present

## 2023-08-25 DIAGNOSIS — R578 Other shock: Secondary | ICD-10-CM | POA: Diagnosis not present

## 2023-08-25 LAB — BLOOD CULTURE ID PANEL (REFLEXED) - BCID2

## 2023-08-25 LAB — URINALYSIS, ROUTINE W REFLEX MICROSCOPIC
Bilirubin Urine: NEGATIVE
Glucose, UA: NEGATIVE mg/dL
Ketones, ur: NEGATIVE mg/dL
Nitrite: NEGATIVE
Protein, ur: 100 mg/dL — AB
Specific Gravity, Urine: 1.015 (ref 1.005–1.030)
WBC, UA: >50 WBC/hpf (ref 0–5)
pH: 9 — ABNORMAL HIGH (ref 5.0–8.0)

## 2023-08-25 LAB — CBC
HCT: 23.5 % — ABNORMAL LOW (ref 39.0–52.0)
Hemoglobin: 7.2 g/dL — ABNORMAL LOW (ref 13.0–17.0)
MCH: 32.6 pg (ref 26.0–34.0)
MCHC: 30.6 g/dL (ref 30.0–36.0)
MCV: 106.3 fL — ABNORMAL HIGH (ref 80.0–100.0)
Platelets: 147 10*3/uL — ABNORMAL LOW (ref 150–400)
RBC: 2.21 MIL/uL — ABNORMAL LOW (ref 4.22–5.81)
RDW: 16.9 % — ABNORMAL HIGH (ref 11.5–15.5)
WBC: 9.4 10*3/uL (ref 4.0–10.5)
nRBC: 0.6 % — ABNORMAL HIGH (ref 0.0–0.2)

## 2023-08-25 LAB — GLUCOSE, CAPILLARY
Glucose-Capillary: 81 mg/dL (ref 70–99)
Glucose-Capillary: 101 mg/dL — ABNORMAL HIGH (ref 70–99)
Glucose-Capillary: 130 mg/dL — ABNORMAL HIGH (ref 70–99)
Glucose-Capillary: 121 mg/dL — ABNORMAL HIGH (ref 70–99)
Glucose-Capillary: 56 mg/dL — ABNORMAL LOW (ref 70–99)
Glucose-Capillary: 109 mg/dL — ABNORMAL HIGH (ref 70–99)
Glucose-Capillary: 89 mg/dL (ref 70–99)

## 2023-08-25 LAB — HEPATITIS B SURFACE ANTIGEN: Hepatitis B Surface Ag: NONREACTIVE

## 2023-08-25 LAB — ECHOCARDIOGRAM LIMITED
Height: 63 in
S' Lateral: 3.7 cm
Weight: 2003.54 [oz_av]

## 2023-08-25 LAB — PHOSPHORUS: Phosphorus: 1.8 mg/dL — ABNORMAL LOW (ref 2.5–4.6)

## 2023-08-25 LAB — BASIC METABOLIC PANEL
Anion gap: 16 — ABNORMAL HIGH (ref 5–15)
BUN: 35 mg/dL — ABNORMAL HIGH (ref 8–23)
CO2: 25 mmol/L (ref 22–32)
Calcium: 8.1 mg/dL — ABNORMAL LOW (ref 8.9–10.3)
Chloride: 95 mmol/L — ABNORMAL LOW (ref 98–111)
Creatinine, Ser: 2.81 mg/dL — ABNORMAL HIGH (ref 0.61–1.24)
GFR, Estimated: 24 mL/min — ABNORMAL LOW (ref >60)
Glucose, Bld: 88 mg/dL (ref 70–99)
Potassium: 4.2 mmol/L (ref 3.5–5.1)
Sodium: 136 mmol/L (ref 135–145)

## 2023-08-25 LAB — TROPONIN I (HIGH SENSITIVITY)
Troponin I (High Sensitivity): 25 ng/L — ABNORMAL HIGH (ref <18)
Troponin I (High Sensitivity): 37 ng/L — ABNORMAL HIGH (ref <18)

## 2023-08-25 LAB — MAGNESIUM: Magnesium: 1.6 mg/dL — ABNORMAL LOW (ref 1.7–2.4)

## 2023-08-25 LAB — LACTIC ACID, PLASMA: Lactic Acid, Venous: 2 mmol/L (ref 0.5–1.9)

## 2023-08-25 LAB — MRSA NEXT GEN BY PCR, NASAL: MRSA by PCR Next Gen: NOT DETECTED

## 2023-08-25 MED ORDER — ACETAMINOPHEN 500 MG PO TABS
1000.0000 mg | ORAL_TABLET | Freq: Four times a day (QID) | ORAL | Status: DC | PRN
  Administered 2023-08-25: 1000 mg via ORAL
  Filled 2023-08-25: qty 2

## 2023-08-25 MED ORDER — MAGNESIUM SULFATE 2 GM/50ML IV SOLN
2.0000 g | Freq: Once | INTRAVENOUS | Status: AC
  Administered 2023-08-25: 2 g via INTRAVENOUS
  Filled 2023-08-25: qty 50

## 2023-08-25 MED ORDER — DEXTROSE 50 % IV SOLN
INTRAVENOUS | Status: AC
  Administered 2023-08-25: 12.5 g via INTRAVENOUS
  Filled 2023-08-25: qty 50

## 2023-08-25 MED ORDER — SODIUM CHLORIDE 0.9 % IV SOLN
1.0000 g | INTRAVENOUS | Status: DC
  Administered 2023-08-25 – 2023-08-29 (×5): 1 g via INTRAVENOUS
  Filled 2023-08-25 (×5): qty 10

## 2023-08-25 MED ORDER — DEXTROSE 50 % IV SOLN: 12.5000 g | INTRAVENOUS | Status: AC

## 2023-08-25 MED ORDER — SODIUM PHOSPHATES 45 MMOLE/15ML IV SOLN
30.0000 mmol | Freq: Once | INTRAVENOUS | Status: AC
  Administered 2023-08-25: 30 mmol via INTRAVENOUS
  Filled 2023-08-25: qty 10

## 2023-08-25 MED ORDER — CHLORHEXIDINE GLUCONATE CLOTH 2 % EX PADS
6.0000 | MEDICATED_PAD | Freq: Every day | CUTANEOUS | Status: DC
  Administered 2023-08-27 – 2023-08-29 (×3): 6 via TOPICAL

## 2023-08-25 NOTE — Plan of Care (Signed)

## 2023-08-25 NOTE — Progress Notes (Signed)
Echocardiogram 2D Echocardiogram has been performed.  [name] 08/25/2023, 10:23 AM

## 2023-08-25 NOTE — Consult Note (Signed)
Renal Service Consult Note [facility]  [name] 08/25/2023 Ian Krabbe, MD Requesting Physician: Dr. Judeth Horn  Reason for Consult: ESRD pt w/ shock and lactic acidosis HPI: The patient is a 68 y.o. year-old w/ PMH as below who presented to ED yesterday afternoon c/o nausea, vomiting and chills for 2-3 days. Pt lives at [facility]. Had to leave dialysis after 2.5 hrs yesterday. ED w/u showed temp 100.4, wbc 12, soft BP's in the 90's. Pt was rx'd for sepsis and rec'd 1 L bolus. Then MAP's dropped < 65 and levo gtt was started. LA was uptrending. Pt was admitted to ICU and started on IV abx. Possible CAP w/ LLL consolidation. Neph consulted for esrd.   Pt seen in ICU. Feeling a bit better today. BP's are better. Pt c/o cough but no abd pain or chest pain. Had full HD Tuesday and 1/2 HD Thursday this week.    ROS - denies CP, no joint pain, no HA, no blurry vision, no rash, no dysuria, no difficulty voiding   Past Medical History  Past Medical History:  Diagnosis Date   CHF (congestive heart failure) (HCC)    Chronic kidney disease    Diabetes mellitus without complication (HCC)    PATIENT JUST LEARNED HE WAS DIABETIC   Hepatitis    Hyperlipemia    Hypertension    Necrotizing fasciitis (HCC)    Pneumonia    HX OF PNA   Past Surgical History  Past Surgical History:  Procedure Laterality Date   A/V FISTULAGRAM N/A 08/16/2023   Procedure: A/V Fistulagram;  Surgeon: Ethelene Hal, MD;  [facility];  Service: Cardiovascular;  Laterality: N/A;   AMPUTATION Left 09/11/2013   Procedure: AMPUTATION BELOW KNEE;  Surgeon: Cheral Almas, MD;  Location: [facility] OR;  Service: Orthopedics;  Laterality: Left;   AMPUTATION Right 12/18/2013   Procedure: RIGHT FOOT 1,2, TOE AMPUTATION  5th toe RAY AMPUTATION;  Surgeon: Cheral Almas, MD;  Location: [facility] OR;  Service: Orthopedics;  Laterality: Right;   AV FISTULA PLACEMENT Left 12/11/2019   Procedure: LEFT  BRACHIOCEPHALIC FISTULA CREATION;  Surgeon: Larina Earthly, MD;  Location: [facility] OR;  Service: Vascular;  Laterality: Left;   BIOPSY  07/19/2023   Procedure: BIOPSY;  Surgeon: Beverley Fiedler, MD;  [facility] ENDOS[facility];  Service: Gastroenterology;;   CHOLECYSTECTOMY     ESOPHAGOGASTRODUODENOSCOPY (EGD) WITH PROPOFOL N/A 07/19/2023   Procedure: ESOPHAGOGASTRODUODENOSCOPY (EGD) WITH PROPOFOL;  Surgeon: Beverley Fiedler, MD;  Location: [facility] ENDOSCOPY;  Service: Gastroenterology;  Laterality: N/A;   HOT HEMOSTASIS N/A 07/19/2023   Procedure: HOT HEMOSTASIS (ARGON PLASMA COAGULATION/BICAP);  Surgeon: Beverley Fiedler, MD;  Location: [facility] ENDOSCOPY;  Service: Gastroenterology;  Laterality: N/A;  stomach   I & D EXTREMITY Left 09/03/2013   Procedure: IRRIGATION AND DEBRIDEMENT EXTREMITY;  Surgeon: Cheral Almas, MD;  Location: [facility] OR;  Service: Orthopedics;  Laterality: Left;   I & D EXTREMITY Left 09/11/2013   Procedure: LEFT FOOT IRRIGATION AND DEBRIDEMENT;  Surgeon: Cheral Almas, MD;  Location: [facility] OR;  Service: Orthopedics;  Laterality: Left;   INSERTION OF DIALYSIS CATHETER  12/11/2019   Procedure: Insertion Of Dialysis Catheter;  Surgeon: Larina Earthly, MD;  Location: [facility] OR;  Service: Vascular;;   INSERTION OF DIALYSIS CATHETER Right 07/11/2022   Procedure: INSERTION OF TUNNELED DIALYSIS CATHETER;  Surgeon: Chuck Hint, MD;  Location: [facility] OR;  Service: Vascular;  Laterality: Right;   LEFT HEART CATH AND  CORONARY ANGIOGRAPHY N/A 12/13/2019   Procedure: LEFT HEART CATH AND CORONARY ANGIOGRAPHY;  Surgeon: Corky Crafts, MD;  Location: [facility] INVASIVE CV LAB;  Service: Cardiovascular;  Laterality: N/A;   PERIPHERAL VASCULAR BALLOON ANGIOPLASTY Left 08/16/2023   Procedure: PERIPHERAL VASCULAR BALLOON ANGIOPLASTY;  Surgeon: Ethelene Hal, MD;  [facility];  Service: Cardiovascular;  Laterality: Left;   REVISON OF ARTERIOVENOUS FISTULA Left 07/11/2022   Procedure: REVISON OF LEFT ARM  ARTERIOVENOUS FISTULA;  Surgeon: Chuck Hint, MD;  Location[facility] OR;  Service: Vascular;  Laterality: Left;   RIGHT HEART CATH N/A 12/13/2019   Procedure: RIGHT HEART CATH;  Surgeon: Corky Crafts, MD;  Location: [facility] INVASIVE CV LAB;  Service: Cardiovascular;  Laterality: N/A;   Family History  Family History  Problem Relation Age of Onset   Diabetes type II Mother    Dementia Mother    Heart disease Father    Social History  reports that he has never smoked. He has never used smokeless tobacco. He reports that he does not drink alcohol and does not use drugs. Allergies  Allergies  Allergen Reactions   Tramadol Nausea And Vomiting    Pt states he took this on an empty stomach one time and it came right back up. He's not had it since.    Home medications Prior to Admission medications   Medication Sig Start Date End Date Taking? Authorizing Provider  amiodarone (PACERONE) 200 MG tablet Take 1 tablet (200 mg total) by mouth daily. 10/15/22 08/25/23  McDiarmid, Leighton Roach, MD  apixaban (ELIQUIS) 2.5 MG TABS tablet Take 1 tablet (2.5 mg total) by mouth 2 (two) times daily. 07/21/23   Arnetha Courser, MD  atorvastatin (LIPITOR) 20 MG tablet Take 1 tablet (20 mg total) by mouth daily. 12/17/19   Elige Radon, MD  bethanechol (URECHOLINE) 10 MG tablet Take 5 mg by mouth 3 (three) times daily.    [provider]  calcitRIOL (ROCALTROL) 0.25 MCG capsule Take 1 capsule (0.25 mcg total) by mouth Every Tuesday,Thursday,and Saturday with dialysis. 07/22/23   Arnetha Courser, MD  ferric citrate (AURYXIA) 1 GM 210 MG(Fe) tablet Take 210 mg by mouth. 07/29/23   [provider]  ferrous sulfate 325 (65 FE) MG tablet Take 325 mg by mouth daily with breakfast.    [provider]  metoprolol succinate (TOPROL-XL) 25 MG 24 hr tablet Take 12.5 mg by mouth 3 (three) times a week. 12/20/22   [provider]  pantoprazole (PROTONIX) 40 MG tablet Take 1 tablet (40 mg  total) by mouth 2 (two) times daily before a meal. 07/21/23   Arnetha Courser, MD  PROMETHAZINE HCL IM Inject 25 mg into the muscle every 4 (four) hours as needed (Nausea and Vomiting).    [provider]  tamsulosin (FLOMAX) 0.4 MG CAPS capsule Take 0.4 mg by mouth See admin instructions. Take one tablet by mouth 3 times a week on Monday, Wednesday and Fridays 12/20/22   [provider]     Vitals:   08/25/23 0900 08/25/23 1000 08/25/23 1100 08/25/23 1140  BP: (!) 110/58 111/63 (!) 111/55   Pulse: 88 93 89   Resp: 14 (!) 35 10   Temp:    98.7 F (37.1 C)  TempSrc:    Oral  SpO2: (!) 84% 95% 98%   Weight:      Height:       Exam Gen alert, no distress, elderly frail male No rash, cyanosis or  gangrene Sclera anicteric, throat clear  No jvd or bruits Chest clear bilat to bases, no rales/ wheezing RRR no MRG Abd soft ntnd no mass or ascites +bs GU  nl male MS no joint effusions or deformity Ext L BKA, multiple R toe amps, no LE or UE edema Neuro is alert, Ox 3 , nf    LUA AVF+bruit       Renal-related home meds: - auryxia ac tid - toprol xl 12.5 three times per week     OP HD: TTS G-O  3h   2.25 bath  B400  52kg   heparin none  LUA AVF - last HD 1/23, post wt 56.6kg - erratic wt gains, gets to dry wt 50% of the time - rocaltrol 0.25 three times per week - mircera 200 q2, last 1/18, due 2/01   Assessment/ Plan: Shock - possible sepsis, PNA. Getting IV abx, levo support for hypotension being weaned off.  L side infiltrates poss PNA.  Acute encephalopathy - seems to be improving ESRD - on HD TTS. Has 1/2 HD yest. Labs / vol are stable. Plan is for HD tomorrow. Volume - 4kg up by wts, no edema on exam. CXR no edema.   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 Next esa due 2/01. Follow.  Secondary hyperparathyroidism - CCa in range and phos is low. Hold any binders for now. Cont po vdra three times per week.  Atrial fib - takes eliquis at home HTN - taking  low dose BB at home      Vinson Moselle  MD [facility] 08/25/2023, 2:30 PM  Recent Labs  Lab 08/24/23 1527 08/24/23 2314 08/25/23 0358  HGB 8.7*  --  7.2*  ALBUMIN 2.7*  --   --   CALCIUM 8.5*  --  8.1*  PHOS  --  1.8*  --   CREATININE 2.33*  --  2.81*  K 4.5  --  4.2   Inpatient medications:  amiodarone  200 mg Oral Daily   apixaban  2.5 mg Oral BID   atorvastatin  20 mg Oral Daily   [START ON 08/26/2023] calcitRIOL  0.25 mcg Oral Q T,Th,Sa-HD   Chlorhexidine Gluconate Cloth  6 each Topical Daily   insulin aspart  0-9 Units Subcutaneous Q4H   midodrine  5 mg Oral TID WC   ondansetron (ZOFRAN) IV  4 mg Intravenous Once   pantoprazole  40 mg Oral BID AC    sodium chloride 10 mL/hr at 08/25/23 0700   cefTRIAXone (ROCEPHIN)  IV 1 g (08/25/23 0931)   acetaminophen, docusate sodium, polyethylene glycol

## 2023-08-25 NOTE — Progress Notes (Signed)
PHARMACY - PHYSICIAN COMMUNICATION CRITICAL VALUE ALERT - BLOOD CULTURE IDENTIFICATION (BCID)  [name] is an 68 y.o. male who presented to [facility] on 08/24/2023 with a chief complaint of PNA.  Assessment:  1/4 staph spp (aerobic bottle), likely contaminant   Name of physician (or Provider) Contacted: Dr. Judeth Horn  Current antibiotics: Rocephin 1gm IV Q24h  Changes to prescribed antibiotics recommended:  Patient is on recommended antibiotics - No changes needed  No results found for this or any previous visit.  [name] 08/25/2023  4:45 PM

## 2023-08-25 NOTE — Progress Notes (Signed)
NAME:  [name], MRN:  [id number], DOB:  Jul 04, 1956, LOS: 1 ADMISSION DATE:  08/24/2023, CONSULTATION DATE:  1/23 REFERRING MD:  Suezanne Jacquet, CHIEF COMPLAINT:  emesis/hypotension   History of Present Illness:  68 yo M who is SNF resident w PMH ESRD TTS iHD, DM2, GIB, Afib on eliquis (2.5 instead of 5 w hx GIB), s/p R toe amputations and L BKA who presented to [facility] ED 1/23 from dialysis  for reported emesis -- 5-10 bouts of nonbloody, nonbilious emesis. He reportedly did not undergo HD. When pt arrived to ED, it was reported to ED RN that PTA pt had watery diarrhea, though in ED he has had soft stools but not diarrhea.  In ED he had a slightly elevated temp to 100.4, mild leukocytosis 12.5 and had softening BP. Started on abx for sepsis. Received  1000cc but MAPs dropped < 65 and he was started on peripheral NE. Noted to also have uptrending LA.    PCCM is called for admission in this setting.   He seems to be a poor historian as his hx to me differs from report to ED provider. For me, he denies n/v/d. He endorses cough "for a good while" and endorses multiple sick contacts at the SNF, but isn't sure what they are sick with. He feels good right now and isnt sure why he is here.   Pertinent  Medical History  DM2 ESRD on HD GIB Afib Chronic AC   Significant Hospital Events: Including procedures, antibiotic start and stop dates in addition to other pertinent events   To ED from HD 2/2 emesis. Admit to ICU on peripheral levophed   Interim History / Subjective:  Seen at bedside this morning. Denies any further episodes of n/v since being admitted. Also denies diarrhea. Feels okay overall but does have a cough.   Objective   Blood pressure (!) 104/54, pulse 87, temperature 98.3 F (36.8 C), temperature source Oral, resp. rate 18, height 5\' 3"  (1.6 m), weight 56.8 kg, SpO2 97%.        Intake/Output Summary (Last 24 hours) at 08/25/2023 4098 Last data filed at 08/25/2023 0500 Gross per 24  hour  Intake 2617.8 ml  Output 25 ml  Net 2592.8 ml   Filed Weights   08/24/23 1524 08/25/23 0420  Weight: 53.8 kg 56.8 kg    Examination: General: chronically ill appearing male, no acute distress HENT: poor dentition Lungs: non labored breathing on room air, some rhonchi appreciated after coughing spell  Cardiovascular: regular rate, rhythm, 2/6 systolic murmur Abdomen: soft, nontender, nondistended  Extremities: L BKA and multiple R toe amputations; LUE AVF with palpable thrill Neuro: oriented x4; follows commands  Skin: dry, flaking skin. R heel sore.  GU: defer  WBC 9.4 Hb 7.2 (stable from December) MCV 106 Plt 147  Na 136 K 4.2 Bicarb 25 Lactic 2.0  Trop 25 > 37  Resolved Hospital Problem list   N/A  Assessment & Plan:  Shock with lactic acidosis Likely hypovolemic shock, but septic shock and cardiogenic not ruled out.  CXR with some LLL consolidation, likely secondary to pneumonia. UA with large leukocyytes and bacteria but has no urinary sxs (patient does make some urine).  Had bouts of emesis at HD, but none while in ICU. Diarrhea has improved- could have norovirus? C diff negative. Lactic acid peaked at 3.3 and is downtredning, down to 2.0 this morning. Procal elevated to 6.45 and patient clinically has s/s of pneumonia with productive cough and some  rhonchi.  - switch abx to ceftriaxone for CAP coverage, 5 day course - blood cultures - sputum cultures - urine cultures - GI panel  - levophed discontinued, MAPs remain >65 - midodrine 5 mg TID  Acute encephalopathy, improved Likely secondary to sepsis, treatment as above. - Delirium precautions - Minimize sedating meds   Afib on eliquis Hypertension HFrecEF Systolic murmur - obtain echocardiogram today  - BNP elevated to 809 - conitnue home eliquis (low dose) - continue amio 200 mg daily  - hold home antihypertensives  ESRD on HD TThS Was sent to ED from HD yesterday and thus, did not get  dialyzed.  - Nephrology consulted this morning, plan for HD tomorrow  - calcitriol 0.25 mcg on HD days   Type 2 diabetes - CBGs + SSI  Hx of GIB Anemia of chronic disease - BID PPI  R heel wound - xray with no evidence of osteomyelitis  Stable for transfer out of ICU today.  Best Practice (right click and "Reselect all SmartList Selections" daily)   Diet/type: clear liquids DVT prophylaxis DOAC Pressure ulcer(s): present on admission  GI prophylaxis: PPI Lines: N/A Foley:  N/A Code Status:  full code Last date of multidisciplinary goals of care discussion [pending]  Labs   CBC: Recent Labs  Lab 08/24/23 1527 08/25/23 0358  WBC 12.5* 9.4  NEUTROABS 12.0*  --   HGB 8.7* 7.2*  HCT 29.3* 23.5*  MCV 109.3* 106.3*  PLT 187 147*    Basic Metabolic Panel: Recent Labs  Lab 08/24/23 1527 08/24/23 2314 08/25/23 0358  NA 137  --  136  K 4.5  --  4.2  CL 94*  --  95*  CO2 27  --  25  GLUCOSE 198*  --  88  BUN 23  --  35*  CREATININE 2.33*  --  2.81*  CALCIUM 8.5*  --  8.1*  MG  --  1.6*  --   PHOS  --  1.8*  --    GFR: Estimated Creatinine Clearance: 21.1 mL/min (A) (by C-G formula based on SCr of 2.81 mg/dL (H)). Recent Labs  Lab 08/24/23 1527 08/24/23 1925 08/24/23 2031 08/24/23 2149 08/24/23 2314 08/25/23 0358  PROCALCITON  --   --   --  6.45  --   --   WBC 12.5*  --   --   --   --  9.4  LATICACIDVEN  --  2.3* 2.9*  --  3.3* 2.0*    Liver Function Tests: Recent Labs  Lab 08/24/23 1527  AST 32  ALT 27  ALKPHOS 104  BILITOT 0.7  PROT 6.8  ALBUMIN 2.7*   Recent Labs  Lab 08/24/23 1527  LIPASE 46   No results for input(s): "AMMONIA" in the last 168 hours.  ABG    Component Value Date/Time   HCO3 25.9 09/23/2022 1653   TCO2 26 07/11/2022 0907   ACIDBASEDEF 2.0 12/13/2019 1509   O2SAT 72.2 09/23/2022 1653     Coagulation Profile: No results for input(s): "INR", "PROTIME" in the last 168 hours.  Cardiac Enzymes: No results for  input(s): "CKTOTAL", "CKMB", "CKMBINDEX", "TROPONINI" in the last 168 hours.  HbA1C: Hgb A1c MFr Bld  Date/Time Value Ref Range Status  08/24/2023 09:49 PM 4.0 (L) 4.8 - 5.6 % Final    Comment:    (NOTE) Pre diabetes:          5.7%-6.4%  Diabetes:              >  6.4%  Glycemic control for   <7.0% adults with diabetes   09/23/2022 01:26 AM 5.6 4.8 - 5.6 % Final    Comment:    (NOTE) Pre diabetes:          5.7%-6.4%  Diabetes:              >6.4%  Glycemic control for   <7.0% adults with diabetes     CBG: Recent Labs  Lab 08/24/23 2204 08/24/23 2225 08/24/23 2358 08/25/23 0354  GLUCAP 212* 194* 150* 81    Review of Systems:   As above  Past Medical History:  He,  has a past medical history of CHF (congestive heart failure) (HCC), Chronic kidney disease, Diabetes mellitus without complication (HCC), Hepatitis, Hyperlipemia, Hypertension, Necrotizing fasciitis (HCC), and Pneumonia.   Surgical History:   Past Surgical History:  Procedure Laterality Date   A/V FISTULAGRAM N/A 08/16/2023   Procedure: A/V Fistulagram;  Surgeon: Ethelene Hal, MD;  Location: [facility] INVASIVE CV LAB;  Service: Cardiovascular;  Laterality: N/A;   AMPUTATION Left 09/11/2013   Procedure: AMPUTATION BELOW KNEE;  Surgeon: Cheral Almas, MD;  Location: [facility] OR;  Service: Orthopedics;  Laterality: Left;   AMPUTATION Right 12/18/2013   Procedure: RIGHT FOOT 1,2, TOE AMPUTATION  5th toe RAY AMPUTATION;  Surgeon: Cheral Almas, MD;  Location: [facility] OR;  Service: Orthopedics;  Laterality: Right;   AV FISTULA PLACEMENT Left 12/11/2019   Procedure: LEFT BRACHIOCEPHALIC FISTULA CREATION;  Surgeon: Larina Earthly, MD;  Location: [facility] OR;  Service: Vascular;  Laterality: Left;   BIOPSY  07/19/2023   Procedure: BIOPSY;  Surgeon: Beverley Fiedler, MD;  Location: [facility] ENDOSCOPY;  Service: Gastroenterology;;   CHOLECYSTECTOMY     ESOPHAGOGASTRODUODENOSCOPY (EGD) WITH PROPOFOL N/A 07/19/2023   Procedure:  ESOPHAGOGASTRODUODENOSCOPY (EGD) WITH PROPOFOL;  Surgeon: Beverley Fiedler, MD;  Location: [facility] ENDOSCOPY;  Service: Gastroenterology;  Laterality: N/A;   HOT HEMOSTASIS N/A 07/19/2023   Procedure: HOT HEMOSTASIS (ARGON PLASMA COAGULATION/BICAP);  Surgeon: Beverley Fiedler, MD;  Location: [facility] ENDOSCOPY;  Service: Gastroenterology;  Laterality: N/A;  stomach   I & D EXTREMITY Left 09/03/2013   Procedure: IRRIGATION AND DEBRIDEMENT EXTREMITY;  Surgeon: Cheral Almas, MD;  Location: [facility] OR;  Service: Orthopedics;  Laterality: Left;   I & D EXTREMITY Left 09/11/2013   Procedure: LEFT FOOT IRRIGATION AND DEBRIDEMENT;  Surgeon: Cheral Almas, MD;  Location: [facility] OR;  Service: Orthopedics;  Laterality: Left;   INSERTION OF DIALYSIS CATHETER  12/11/2019   Procedure: Insertion Of Dialysis Catheter;  Surgeon: Larina Earthly, MD;  Location: [facility] OR;  Service: Vascular;;   INSERTION OF DIALYSIS CATHETER Right 07/11/2022   Procedure: INSERTION OF TUNNELED DIALYSIS CATHETER;  Surgeon: Chuck Hint, MD;  Location: [facility] OR;  Service: Vascular;  Laterality: Right;   LEFT HEART CATH AND CORONARY ANGIOGRAPHY N/A 12/13/2019   Procedure: LEFT HEART CATH AND CORONARY ANGIOGRAPHY;  Surgeon: Corky Crafts, MD;  Location: [facility] INVASIVE CV LAB;  Service: Cardiovascular;  Laterality: N/A;   PERIPHERAL VASCULAR BALLOON ANGIOPLASTY Left 08/16/2023   Procedure: PERIPHERAL VASCULAR BALLOON ANGIOPLASTY;  Surgeon: Ethelene Hal, MD;  Location: [facility] INVASIVE CV LAB;  Service: Cardiovascular;  Laterality: Left;   REVISON OF ARTERIOVENOUS FISTULA Left 07/11/2022   Procedure: REVISON OF LEFT ARM ARTERIOVENOUS FISTULA;  Surgeon: Chuck Hint, MD;  Location: [facility] OR;  Service: Vascular;  Laterality: Left;   RIGHT HEART CATH N/A 12/13/2019   Procedure: RIGHT HEART CATH;  Surgeon:  Corky Crafts, MD;  Location: [facility] INVASIVE CV LAB;  Service: Cardiovascular;  Laterality: N/A;     Social History:   reports that he has never  smoked. He has never used smokeless tobacco. He reports that he does not drink alcohol and does not use drugs.   Family History:  His family history includes Dementia in his mother; Diabetes type II in his mother; Heart disease in his father.   Allergies Allergies  Allergen Reactions   Tramadol Nausea And Vomiting    Pt states he took this on an empty stomach one time and it came right back up. He's not had it since.      Home Medications  Prior to Admission medications   Medication Sig Start Date End Date Taking? Authorizing Provider  amiodarone (PACERONE) 200 MG tablet Take 1 tablet (200 mg total) by mouth daily. 10/15/22 07/17/23  McDiarmid, Leighton Roach, MD  apixaban (ELIQUIS) 2.5 MG TABS tablet Take 1 tablet (2.5 mg total) by mouth 2 (two) times daily. 07/21/23   Arnetha Courser, MD  atorvastatin (LIPITOR) 20 MG tablet Take 1 tablet (20 mg total) by mouth daily. 12/17/19   Elige Radon, MD  bethanechol (URECHOLINE) 10 MG tablet Take 5 mg by mouth 3 (three) times daily.    [provider]  calcitRIOL (ROCALTROL) 0.25 MCG capsule Take 1 capsule (0.25 mcg total) by mouth Every Tuesday,Thursday,and Saturday with dialysis. 07/22/23   Arnetha Courser, MD  ferric citrate (AURYXIA) 1 GM 210 MG(Fe) tablet Take 210 mg by mouth. 07/29/23   [provider]  ferrous sulfate 325 (65 FE) MG tablet Take 325 mg by mouth daily with breakfast.    [provider]  metoprolol succinate (TOPROL-XL) 25 MG 24 hr tablet Take 12.5 mg by mouth 3 (three) times a week. 12/20/22   [provider]  pantoprazole (PROTONIX) 40 MG tablet Take 1 tablet (40 mg total) by mouth 2 (two) times daily before a meal. 07/21/23   Arnetha Courser, MD  PROMETHAZINE HCL IM Inject 25 mg into the muscle every 4 (four) hours as needed (Nausea and Vomiting).    [provider]  tamsulosin (FLOMAX) 0.4 MG CAPS capsule Take 0.4 mg by mouth See admin instructions. Take one tablet by mouth 3 times a week on  Monday, Wednesday and Fridays 12/20/22   [provider]     Critical care time: 34 min

## 2023-08-25 NOTE — Consult Note (Addendum)
WOC Nurse Consult Note: Reason for Consult: Consult requested for right heel.  Performed remotely after review of progress notes and photo in the EMR.  Pt has pink dry tissue surrounding a remaining area of Stage 2 partial thickness healing wound, total area of peeled skin is 2.5X1X.1cm, according to bedside nurses notes. Stage 2 is a small section in the center.  Loose dry yellow peeling skin surrounding the location. Pressure Injury POA: Yes Dressing procedure/placement/frequency: Topical treatment orders provided for bedside nurses to perform as follows: Float heel to reduce pressure in Prevalon boot. Foam dressing to right heel, change Q 3 days or PRN soiling. Please re-consult if further assistance is needed.  Thank-you,  Cammie Mcgee MSN, RN, CWOCN, [place], CNS [phone]

## 2023-08-26 DIAGNOSIS — A419 Sepsis, unspecified organism: Secondary | ICD-10-CM | POA: Diagnosis not present

## 2023-08-26 DIAGNOSIS — R6521 Severe sepsis with septic shock: Secondary | ICD-10-CM | POA: Diagnosis not present

## 2023-08-26 LAB — BASIC METABOLIC PANEL
Anion gap: 15 (ref 5–15)
BUN: 46 mg/dL — ABNORMAL HIGH (ref 8–23)
CO2: 22 mmol/L (ref 22–32)
Calcium: 7.7 mg/dL — ABNORMAL LOW (ref 8.9–10.3)
Chloride: 99 mmol/L (ref 98–111)
Creatinine, Ser: 3.93 mg/dL — ABNORMAL HIGH (ref 0.61–1.24)
GFR, Estimated: 16 mL/min — ABNORMAL LOW (ref >60)
Glucose, Bld: 90 mg/dL (ref 70–99)
Potassium: 4.6 mmol/L (ref 3.5–5.1)
Sodium: 136 mmol/L (ref 135–145)

## 2023-08-26 LAB — GLUCOSE, CAPILLARY
Glucose-Capillary: 81 mg/dL (ref 70–99)
Glucose-Capillary: 79 mg/dL (ref 70–99)
Glucose-Capillary: 77 mg/dL (ref 70–99)
Glucose-Capillary: 69 mg/dL — ABNORMAL LOW (ref 70–99)
Glucose-Capillary: 112 mg/dL — ABNORMAL HIGH (ref 70–99)
Glucose-Capillary: 120 mg/dL — ABNORMAL HIGH (ref 70–99)
Glucose-Capillary: 86 mg/dL (ref 70–99)

## 2023-08-26 LAB — URINE CULTURE: Culture: 2000 — AB

## 2023-08-26 LAB — CBC
HCT: 24.7 % — ABNORMAL LOW (ref 39.0–52.0)
Hemoglobin: 7.6 g/dL — ABNORMAL LOW (ref 13.0–17.0)
MCH: 32.8 pg (ref 26.0–34.0)
MCHC: 30.8 g/dL (ref 30.0–36.0)
MCV: 106.5 fL — ABNORMAL HIGH (ref 80.0–100.0)
Platelets: 145 10*3/uL — ABNORMAL LOW (ref 150–400)
RBC: 2.32 MIL/uL — ABNORMAL LOW (ref 4.22–5.81)
RDW: 17.2 % — ABNORMAL HIGH (ref 11.5–15.5)
WBC: 6.1 10*3/uL (ref 4.0–10.5)
nRBC: 0.3 % — ABNORMAL HIGH (ref 0.0–0.2)

## 2023-08-26 LAB — HEPATITIS B SURFACE ANTIBODY, QUANTITATIVE: Hep B S AB Quant (Post): <3.5 m[IU]/mL — ABNORMAL LOW

## 2023-08-26 LAB — PHOSPHORUS: Phosphorus: 6.7 mg/dL — ABNORMAL HIGH (ref 2.5–4.6)

## 2023-08-26 MED ORDER — DEXTROSE 50 % IV SOLN
12.5000 g | Freq: Once | INTRAVENOUS | Status: AC
  Administered 2023-08-26: 12.5 g via INTRAVENOUS
  Filled 2023-08-26: qty 50

## 2023-08-26 MED ORDER — LIDOCAINE-PRILOCAINE 2.5-2.5 % EX CREA: 1.0000 | TOPICAL_CREAM | CUTANEOUS | Status: DC | PRN

## 2023-08-26 MED ORDER — ALTEPLASE 2 MG IJ SOLR: 2.0000 mg | Freq: Once | INTRAMUSCULAR | Status: DC | PRN

## 2023-08-26 MED ORDER — NEPRO/CARBSTEADY PO LIQD: 237.0000 mL | ORAL | Status: DC | PRN

## 2023-08-26 MED ORDER — ANTICOAGULANT SODIUM CITRATE 4% (200MG/5ML) IV SOLN
5.0000 mL | Status: DC | PRN
Start: 2023-08-26 — End: 2023-08-26

## 2023-08-26 MED ORDER — LIDOCAINE HCL (PF) 1 % IJ SOLN
5.0000 mL | INTRAMUSCULAR | Status: DC | PRN
Start: 2023-08-26 — End: 2023-08-26

## 2023-08-26 MED ORDER — HEPARIN SODIUM (PORCINE) 1000 UNIT/ML DIALYSIS: 1000.0000 [IU] | INTRAMUSCULAR | Status: DC | PRN

## 2023-08-26 MED ORDER — PENTAFLUOROPROP-TETRAFLUOROETH EX AERO: 1.0000 | INHALATION_SPRAY | CUTANEOUS | Status: DC | PRN

## 2023-08-26 NOTE — Progress Notes (Signed)
PHARMACY - PHYSICIAN COMMUNICATION CRITICAL VALUE ALERT - BLOOD CULTURE IDENTIFICATION (BCID)  [name] is an 68 y.o. male who presented to [facility] on 08/24/2023 with a chief complaint of PNA.  Assessment:  BCID call received for growth in 1 of 4 bottles of gram positive rods, not speciated. Growth 1/24 reported with staph spp likely contaminant WBC wnl, afebrile, LA 2.0 on 1/24 0358 w/o repeat  Name of physician (or Provider) Contacted: Dr. Tyson Babinski  Current antibiotics: Rocephin 1gm IV Q24h  Changes to prescribed antibiotics recommended:  No changes, pt remains hemodynamically stable and overall improved from presentation  Results for orders placed or performed during the hospital encounter of 08/24/23  Blood Culture ID Panel (Reflexed) (Collected: 08/24/2023  7:00 PM)  Result Value Ref Range   Enterococcus faecalis NOT DETECTED NOT DETECTED   Enterococcus Faecium NOT DETECTED NOT DETECTED   Listeria monocytogenes NOT DETECTED NOT DETECTED   Staphylococcus species DETECTED (A) NOT DETECTED   Staphylococcus aureus (BCID) NOT DETECTED NOT DETECTED   Staphylococcus epidermidis NOT DETECTED NOT DETECTED   Staphylococcus lugdunensis NOT DETECTED NOT DETECTED   Streptococcus species NOT DETECTED NOT DETECTED   Streptococcus agalactiae NOT DETECTED NOT DETECTED   Streptococcus pneumoniae NOT DETECTED NOT DETECTED   Streptococcus pyogenes NOT DETECTED NOT DETECTED   A.calcoaceticus-baumannii NOT DETECTED NOT DETECTED   Bacteroides fragilis NOT DETECTED NOT DETECTED   Enterobacterales NOT DETECTED NOT DETECTED   Enterobacter cloacae complex NOT DETECTED NOT DETECTED   Escherichia coli NOT DETECTED NOT DETECTED   Klebsiella aerogenes NOT DETECTED NOT DETECTED   Klebsiella oxytoca NOT DETECTED NOT DETECTED   Klebsiella pneumoniae NOT DETECTED NOT DETECTED   Proteus species NOT DETECTED NOT DETECTED   Salmonella species NOT DETECTED NOT DETECTED   Serratia marcescens NOT DETECTED NOT  DETECTED   Haemophilus influenzae NOT DETECTED NOT DETECTED   Neisseria meningitidis NOT DETECTED NOT DETECTED   Pseudomonas aeruginosa NOT DETECTED NOT DETECTED   Stenotrophomonas maltophilia NOT DETECTED NOT DETECTED   Candida albicans NOT DETECTED NOT DETECTED   Candida auris NOT DETECTED NOT DETECTED   Candida glabrata NOT DETECTED NOT DETECTED   Candida krusei NOT DETECTED NOT DETECTED   Candida parapsilosis NOT DETECTED NOT DETECTED   Candida tropicalis NOT DETECTED NOT DETECTED   Cryptococcus neoformans/gattii NOT DETECTED NOT DETECTED    Rutherford Nail, PharmD PGY2 Critical Care Pharmacy Resident 08/26/2023  10:45 AM

## 2023-08-26 NOTE — Progress Notes (Signed)
   08/26/23 1725  Vitals  Temp 98.5 F (36.9 C)  Pulse Rate 92  Resp (!) 35  BP (!) 118/58  SpO2 100 %  O2 Device Nasal Cannula  Weight 56.6 kg  Type of Weight Post-Dialysis  Oxygen Therapy  O2 Flow Rate (L/min) 4 L/min  Patient Activity (if Appropriate) In bed  Pulse Oximetry Type Continuous  Oximetry Probe Site Changed No  Post Treatment  Dialyzer Clearance Lightly streaked  Hemodialysis Intake (mL) 0 mL  Liters Processed 78  Fluid Removed (mL) 2000 mL  Tolerated HD Treatment Yes  Post-Hemodialysis Comments tx completed at 1720  AVG/AVF Arterial Site Held (minutes) 10 minutes  AVG/AVF Venous Site Held (minutes) 10 minutes   Received patient in bed to unit.  Alert and oriented.  Informed consent signed and in chart.   TX duration:3.15  Patient tolerated well.  Transported back to the room  Alert, without acute distress.  Hand-off given to patient's nurse.   Access used: LUAF Access issues: no complications  Total UF removed: 2000 Medication(s) given: none   [facility]

## 2023-08-26 NOTE — Progress Notes (Addendum)
PROGRESS NOTE  [name] GUY:[id number] DOB: Apr 16, 1956 DOA: 08/24/2023 PCP: Patient, No Pcp Per   LOS: 2 days   Brief narrative:  68 year old male with past medical history of end-stage renal disease on Tuesday, Thursday Saturday, diabetes mellitus type 2, GI bleed, atrial fibrillation on Eliquis, s/p R toe amputations and L BKA currently at a skilled nursing facility presented to the hospital with vomiting multiple episodes with some watery diarrhea.  In the ED patient was noted to be febrile with temperature of 100.4 F with mild leukocytosis and soft blood pressure.  He was empirically treated with IV antibiotics and vasopressors.  Initiated for hypotension.  Initially was admitted to the ICU but subsequently got better so was transferred out of the ICU to medical team.    Assessment/Plan: Principal Problem:   Septic shock (HCC) Active Problems:   Encephalopathy acute   Lactic acidosis  Undifferentiated shock with lactic acidosis Likely hypovolemic shock, but septic shock and cardiogenic not ruled out.  Chest x-ray showed some left lower lobe consolidation likely secondary to pneumonia.  Urine anxiolysis with leukocytes but no other symptoms.  Diarrhea has improved.  COVID influenza and RSV was negative.  C. difficile was negative.  Lactic acid downtrending.  Procalcitonin was elevated to 6.4 on presentation.  Had cough congestion on presentation.  Continue on Rocephin for pneumonia.  Blood cultures negative in less than 1 day.  Sputum cultures urine culture pending.  Was initially on Levophed which was discontinued and has been started on midodrine 5 mg 3 times daily.  Blood pressure is better at this time.  Afebrile at this time with no leukocytosis.  Did have leukocytosis on presentation.  Hepatitis B surface antigen none reactive.  Blood culture showing staph species.  Will continue to monitor blood cultures.  Possible left lower lobe pneumonia.  On Rocephin IV at this time.  On  supplemental oxygen as well.  Will continue to wean as able.  Acute metabolic encephalopathy, improved Likely secondary to sepsis, continue delirium precautions.   Afib on eliquis Hypertension Systolic congestive heart failure.  2D echocardiogram 08/25/2023 showed LV ejection fraction of 40 to 45% with global hypokinesis. BNP elevated to 809.  Continue amiodarone, Eliquis.  On 3 L of oxygen at this time.   ESRD on HD TThS Was sent to ED from HD and did not get hemodialysis as outpatient.  Nephrology was consulted for hemodialysis needs.   Type 2 diabetes melitis Continue with blood glucose monitoring, sliding scale insulin.  Hemoglobin A1c was 4.0.   Hx of GIB Anemia of chronic disease Continue PPI.   R heel wound - xray with no evidence of osteomyelitis, continue local wound care.  Pressure injury stage I to the coccyx and stage II to the right heel present on admission.  Continue wound care. Pressure Injury 08/24/23 Coccyx Medial;Upper Stage 1 -  Intact skin with non-blanchable redness of a localized area usually over a bony prominence. Pink, red, no drainage (Active)  08/24/23 2225  Location: Coccyx  Location Orientation: Medial;Upper  Staging: Stage 1 -  Intact skin with non-blanchable redness of a localized area usually over a bony prominence.  Wound Description (Comments): Pink, red, no drainage (Image in chart)  Present on Admission: Yes     Pressure Injury 08/25/23 Heel Right Stage 2 -  Partial thickness loss of dermis presenting as a shallow open injury with a red, pink wound bed without slough. (Active)  08/25/23   Location: Heel  Location Orientation: Right  Staging: Stage 2 -  Partial thickness loss of dermis presenting as a shallow open injury with a red, pink wound bed without slough.  Wound Description (Comments):   Present on Admission: Yes      DVT prophylaxis: apixaban (ELIQUIS) tablet 2.5 mg Start: 08/25/23 1000 apixaban (ELIQUIS) tablet 2.5 mg    Disposition: Likely skilled nursing facility in 1 to 2 days. Patient from skilled nursing facility.  Will get PT OT consultation.  Status is: Inpatient  Remains inpatient appropriate because: Pending clinical improvement, IV antibiotic,    Code Status:     Code Status: Full Code  Family Communication:  I tried to reach out to the patient's friend but was unable to reach.  I did speak with the patient's sister on the phone and updated her about the clinical condition of the patient.  Consultants: [facility] Nephrology  Procedures: Hemodialysis  Anti-infectives:  Rocephin IV  Anti-infectives (From admission, onward)    Start     Dose/Rate Route Frequency Ordered Stop   08/25/23 1400  piperacillin-tazobactam (ZOSYN) IVPB 2.25 g  Status:  Discontinued        2.25 g 100 mL/hr over 30 Minutes Intravenous Every 8 hours 08/24/23 2151 08/25/23 0840   08/25/23 0930  cefTRIAXone (ROCEPHIN) 1 g in sodium chloride 0.9 % 100 mL IVPB        1 g 200 mL/hr over 30 Minutes Intravenous Every 24 hours 08/25/23 0840     08/24/23 1845  vancomycin (VANCOCIN) IVPB 1000 mg/200 mL premix        1,000 mg 200 mL/hr over 60 Minutes Intravenous  Once 08/24/23 1831 08/24/23 2041   08/24/23 1830  vancomycin (VANCOREADY) IVPB 750 mg/150 mL  Status:  Discontinued        750 mg 150 mL/hr over 60 Minutes Intravenous  Once 08/24/23 1829 08/24/23 1831   08/24/23 1830  ceFEPIme (MAXIPIME) 2 g in sodium chloride 0.9 % 100 mL IVPB        2 g 200 mL/hr over 30 Minutes Intravenous  Once 08/24/23 1829 08/24/23 1953        Subjective: Today, patient was seen and examined at bedside.  Patient states that he has no dyspnea or shortness of breath but has some cough.  Denies any chest pain, fever, chills or rigor.  Denies any nausea vomiting or abdominal pain.  Objective: Vitals:   08/26/23 0800 08/26/23 0900  BP: 137/74 133/64  Pulse: 90 82  Resp: (!) 30 (!) 26  Temp:    SpO2: 93% 97%    Intake/Output  Summary (Last 24 hours) at 08/26/2023 1102 Last data filed at 08/26/2023 0000 Gross per 24 hour  Intake 320 ml  Output --  Net 320 ml   Filed Weights   08/24/23 1524 08/25/23 0420  Weight: 53.8 kg 56.8 kg   Body mass index is 22.18 kg/m.   Physical Exam:  GENERAL: Patient is alert awake and Communicative. Not in obvious distress.  On nasal cannula oxygen, appears chronically ill, HENT: No scleral pallor or icterus. Pupils equally reactive to light. Oral mucosa is moist NECK: is supple, no gross swelling noted. CHEST: Diminished breath sounds bilaterally.  Clear breath sounds noted. CVS: S1 and S2 heard, no murmur. Regular rate and rhythm.  ABDOMEN: Soft, non-tender, bowel sounds are present. EXTREMITIES: Left below-knee amputation.  Left upper extremity AV fistula in place.  No edema. CNS: Cranial nerves are intact. No focal motor deficits. SKIN: warm and dry, pressure injury to the  coccyx and right heel.  Data Review: I have personally reviewed the following laboratory data and studies,  CBC: Recent Labs  Lab 08/24/23 1527 08/25/23 0358 08/26/23 0349  WBC 12.5* 9.4 6.1  NEUTROABS 12.0*  --   --   HGB 8.7* 7.2* 7.6*  HCT 29.3* 23.5* 24.7*  MCV 109.3* 106.3* 106.5*  PLT 187 147* 145*   Basic Metabolic Panel: Recent Labs  Lab 08/24/23 1527 08/24/23 2314 08/25/23 0358 08/26/23 0349  NA 137  --  136 136  K 4.5  --  4.2 4.6  CL 94*  --  95* 99  CO2 27  --  25 22  GLUCOSE 198*  --  88 90  BUN 23  --  35* 46*  CREATININE 2.33*  --  2.81* 3.93*  CALCIUM 8.5*  --  8.1* 7.7*  MG  --  1.6*  --   --   PHOS  --  1.8*  --  6.7*   Liver Function Tests: Recent Labs  Lab 08/24/23 1527  AST 32  ALT 27  ALKPHOS 104  BILITOT 0.7  PROT 6.8  ALBUMIN 2.7*   Recent Labs  Lab 08/24/23 1527  LIPASE 46   No results for input(s): "AMMONIA" in the last 168 hours. Cardiac Enzymes: No results for input(s): "CKTOTAL", "CKMB", "CKMBINDEX", "TROPONINI" in the last 168  hours. BNP (last 3 results) Recent Labs    09/22/22 1633 09/27/22 0035 08/24/23 2149  BNP 3,189.2* 3,505.9* 809.2*    ProBNP (last 3 results) No results for input(s): "PROBNP" in the last 8760 hours.  CBG: Recent Labs  Lab 08/25/23 1931 08/25/23 2030 08/25/23 2308 08/26/23 0326 08/26/23 0745  GLUCAP 56* 109* 89 81 79   Recent Results (from the past 240 hours)  Resp panel by RT-PCR (RSV, Flu A&B, Covid) Anterior Nasal Swab     Status: None   Collection Time: 08/24/23  4:44 PM   Specimen: Anterior Nasal Swab  Result Value Ref Range Status   SARS Coronavirus 2 by RT PCR NEGATIVE NEGATIVE Final   Influenza A by PCR NEGATIVE NEGATIVE Final   Influenza B by PCR NEGATIVE NEGATIVE Final    Comment: (NOTE) The Xpert Xpress SARS-CoV-2/FLU/RSV plus assay is intended as an aid in the diagnosis of influenza from Nasopharyngeal swab specimens and should not be used as a sole basis for treatment. Nasal washings and aspirates are unacceptable for Xpert Xpress SARS-CoV-2/FLU/RSV testing.  Fact Sheet for Patients: BloggerCourse.com  Fact Sheet for Healthcare Providers: SeriousBroker.it  This test is not yet approved or cleared by the [place] FDA and has been authorized for detection and/or diagnosis of SARS-CoV-2 by FDA under an Emergency Use Authorization (EUA). This EUA will remain in effect (meaning this test can be used) for the duration of the COVID-19 declaration under Section 564(b)(1) of the Act, 21 U.S.C. section 360bbb-3(b)(1), unless the authorization is terminated or revoked.     Resp Syncytial Virus by PCR NEGATIVE NEGATIVE Final    Comment: (NOTE) Fact Sheet for Patients: BloggerCourse.com  Fact Sheet for Healthcare Providers: SeriousBroker.it  This test is not yet approved or cleared by the [place] FDA and has been authorized for detection and/or  diagnosis of SARS-CoV-2 by FDA under an Emergency Use Authorization (EUA). This EUA will remain in effect (meaning this test can be used) for the duration of the COVID-19 declaration under Section 564(b)(1) of the Act, 21 U.S.C. section 360bbb-3(b)(1), unless the authorization is terminated or revoked.  Performed at [facility], [address], [place], [place] [address]   Blood culture (routine x 2)     Status: None (Preliminary result)   Collection Time: 08/24/23  7:00 PM   Specimen: BLOOD  Result Value Ref Range Status   Specimen Description BLOOD SITE NOT SPECIFIED  Final   Special Requests   Final    BOTTLES DRAWN AEROBIC AND ANAEROBIC Blood Culture results may not be optimal due to an inadequate volume of blood received in culture bottles   Culture  Setup Time   Final    GRAM POSITIVE COCCI IN CLUSTERS IN BOTH AEROBIC AND ANAEROBIC BOTTLES CRITICAL RESULT CALLED TO, READ BACK BY AND VERIFIED WITH: PHARMD PAYTES [id number] @ 1645 fh Performed at [facility], [address], [place], [place] [address]    Culture GRAM POSITIVE COCCI  Final   Report Status PENDING  Incomplete  Blood Culture ID Panel (Reflexed)     Status: Abnormal   Collection Time: 08/24/23  7:00 PM  Result Value Ref Range Status   Enterococcus faecalis NOT DETECTED NOT DETECTED Final   Enterococcus Faecium NOT DETECTED NOT DETECTED Final   Listeria monocytogenes NOT DETECTED NOT DETECTED Final   Staphylococcus species DETECTED (A) NOT DETECTED Final    Comment: CRITICAL RESULT CALLED TO, READ BACK BY AND VERIFIED WITH: PHARMD PAYTES [id number] @ 1645 fh    Staphylococcus aureus (BCID) NOT DETECTED NOT DETECTED Final   Staphylococcus epidermidis NOT DETECTED NOT DETECTED Final   Staphylococcus lugdunensis NOT DETECTED NOT DETECTED Final   Streptococcus species NOT DETECTED NOT DETECTED Final   Streptococcus agalactiae NOT DETECTED NOT DETECTED Final   Streptococcus pneumoniae NOT DETECTED NOT DETECTED  Final   Streptococcus pyogenes NOT DETECTED NOT DETECTED Final   A.calcoaceticus-baumannii NOT DETECTED NOT DETECTED Final   Bacteroides fragilis NOT DETECTED NOT DETECTED Final   Enterobacterales NOT DETECTED NOT DETECTED Final   Enterobacter cloacae complex NOT DETECTED NOT DETECTED Final   Escherichia coli NOT DETECTED NOT DETECTED Final   Klebsiella aerogenes NOT DETECTED NOT DETECTED Final   Klebsiella oxytoca NOT DETECTED NOT DETECTED Final   Klebsiella pneumoniae NOT DETECTED NOT DETECTED Final   Proteus species NOT DETECTED NOT DETECTED Final   Salmonella species NOT DETECTED NOT DETECTED Final   Serratia marcescens NOT DETECTED NOT DETECTED Final   Haemophilus influenzae NOT DETECTED NOT DETECTED Final   Neisseria meningitidis NOT DETECTED NOT DETECTED Final   Pseudomonas aeruginosa NOT DETECTED NOT DETECTED Final   Stenotrophomonas maltophilia NOT DETECTED NOT DETECTED Final   Candida albicans NOT DETECTED NOT DETECTED Final   Candida auris NOT DETECTED NOT DETECTED Final   Candida glabrata NOT DETECTED NOT DETECTED Final   Candida krusei NOT DETECTED NOT DETECTED Final   Candida parapsilosis NOT DETECTED NOT DETECTED Final   Candida tropicalis NOT DETECTED NOT DETECTED Final   Cryptococcus neoformans/gattii NOT DETECTED NOT DETECTED Final    Comment: Performed at [facility], [address] [place], [place] [address]  Blood culture (routine x 2)     Status: None (Preliminary result)   Collection Time: 08/24/23  7:15 PM   Specimen: BLOOD  Result Value Ref Range Status   Specimen Description BLOOD SITE NOT SPECIFIED  Final   Special Requests   Final    BOTTLES DRAWN AEROBIC AND ANAEROBIC Blood Culture results may not be optimal due to an inadequate volume of blood received in culture bottles   Culture  Setup Time   Final  GRAM POSITIVE RODS AEROBIC BOTTLE ONLY CRITICAL RESULT CALLED TO, READ BACK BY AND VERIFIED WITH: PHARMD JENNIE Z. [id number], ADC  Performed  at [facility], [address], [place], [place] [address]    Culture GRAM POSITIVE RODS  Final   Report Status PENDING  Incomplete  MRSA Next Gen by PCR, Nasal     Status: None   Collection Time: 08/24/23  8:13 PM   Specimen: Nasal Mucosa; Nasal Swab  Result Value Ref Range Status   MRSA by PCR Next Gen NOT DETECTED NOT DETECTED Final    Comment: (NOTE) The GeneXpert MRSA Assay (FDA approved for NASAL specimens only), is one component of a comprehensive MRSA colonization surveillance program. It is not intended to diagnose MRSA infection nor to guide or monitor treatment for MRSA infections. Test performance is not FDA approved in patients less than 53 years old. Performed at [facility], [address] [place], [place] [address]   C Difficile Quick Screen w PCR reflex     Status: None   Collection Time: 08/24/23 10:30 PM   Specimen: STOOL  Result Value Ref Range Status   C Diff antigen NEGATIVE NEGATIVE Final   C Diff toxin NEGATIVE NEGATIVE Final   C Diff interpretation No C. difficile detected.  Final    Comment: Performed at [facility], [address] [place], [place] [address]  Urine Culture     Status: None (Preliminary result)   Collection Time: 08/25/23  4:27 AM   Specimen: In/Out Cath Urine  Result Value Ref Range Status   Specimen Description IN/OUT CATH URINE  Final   Special Requests NONE  Final   Culture   Final    CULTURE REINCUBATED FOR BETTER GROWTH Performed at [facility], [address], [place], [place] [address]    Report Status PENDING  Incomplete     Studies: ECHOCARDIOGRAM LIMITED Result Date: 08/25/2023    ECHOCARDIOGRAM LIMITED REPORT   Patient Name:   LADARREN STEINER Date of Exam: 08/25/2023 Medical Rec #:  [id number]    Height:       63.0 in Accession #:    [id number]   Weight:       125.2 lb Date of Birth:  [birth year]   BSA:          1.585 m Patient Age:    67 years     BP:           104/54 mmHg Patient Gender: M             HR:           93 bpm. Exam Location:  Inpatient Procedure: Limited Echo and Limited Color Doppler Indications:    Shock R57.9  History:        Patient has prior history of Echocardiogram examinations, most                 recent 11/18/2022. CHF and Cardiomyopathy, Previous Myocardial                 Infarction, PAD and ESRD, Arrythmias:Atrial Fibrillation,                 Signs/Symptoms:Hypotension; Risk Factors:Hypertension and                 Diabetes.  Sonographer:    Lucendia Herrlich RCS Referring Phys: [id number] GRACE E BOWSER  Sonographer Comments: Image acquisition challenging due to uncooperative patient. IMPRESSIONS  1. Left ventricular ejection fraction, by estimation, is 40 to 45%. The left ventricle has mildly decreased function. The left ventricle demonstrates global hypokinesis.  2. Right ventricular systolic function is normal. The right ventricular size is normal.  3. The mitral valve is normal in structure. Trivial mitral valve regurgitation. No evidence of mitral stenosis.  4. The aortic valve is tricuspid. Aortic valve regurgitation is trivial. FINDINGS  Left Ventricle: Left ventricular ejection fraction, by estimation, is 40 to 45%. The left ventricle has mildly decreased function. The left ventricle demonstrates global hypokinesis. The left ventricular internal cavity size was normal in size. There is  no left ventricular hypertrophy. Right Ventricle: The right ventricular size is normal. No increase in right ventricular wall thickness. Right ventricular systolic function is normal. Mitral Valve: The mitral valve is normal in structure. Trivial mitral valve regurgitation. No evidence of mitral valve stenosis. Aortic Valve: The aortic valve is tricuspid. Aortic valve regurgitation is trivial. Aorta: The aortic root is normal in size and structure. Venous: The inferior vena cava was not well visualized. LEFT VENTRICLE PLAX 2D LVIDd:         4.10 cm LVIDs:         3.70 cm LV PW:         1.00 cm LV  IVS:        0.70 cm LVOT diam:     1.80 cm LVOT Area:     2.54 cm  LEFT ATRIUM         Index LA diam:    4.30 cm 2.71 cm/m   AORTA Ao Root diam: 3.10 cm Ao Asc diam:  3.10 cm  SHUNTS Systemic Diam: 1.80 cm [name] MD Electronically signed by [name] MD Signature Date/Time: 08/25/2023/3:27:08 PM    Final    DG Foot 2 Views Right Result Date: 08/24/2023 CLINICAL DATA:  Sepsis EXAM: RIGHT FOOT - 2 VIEW COMPARISON:  Foot radiographs 08/16/2021 FINDINGS: Amputation of the 1st-2nd toes at the MTP joint. Amputation of the fifth toe at the mid metatarsal. 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 No definite osteomyelitis or acute fracture. Calcaneal spurs. Mild diffuse soft tissue swelling. IMPRESSION: No definite osteomyelitis. Electronically Signed   By: Minerva Fester M.D.   On: 08/24/2023 23:10   DG Chest Portable 1 View Result Date: 08/24/2023 CLINICAL DATA:  Hypoxia. Nausea, vomiting and chills during dialysis. EXAM: PORTABLE CHEST 1 VIEW COMPARISON:  09/29/2022 FINDINGS: The heart is within normal limits in size given AP projection and portable technique. The mediastinal and hilar contours are within normal limits. Low lung volumes with streaky bibasilar atelectasis but no infiltrates, edema effusions. The bony thorax is intact. IMPRESSION: Low lung volumes with streaky bibasilar atelectasis. Electronically Signed   By: Rudie Meyer M.D.   On: 08/24/2023 17:43      Joycelyn Das, MD  [facility] 08/26/2023  If 7PM-7AM, please contact night-coverage

## 2023-08-26 NOTE — Progress Notes (Signed)
[name] Progress Note   Subjective:   Pt seen in ICU, off pressors. Reports he feels "really good." Denies SOB, CP, dizziness, nausea.   Objective Vitals:   08/26/23 0300 08/26/23 0326 08/26/23 0400 08/26/23 0748  BP: 128/68  135/70   Pulse: 82  83   Resp: (!) 29  (!) 28   Temp:  98.4 F (36.9 C)  98.3 F (36.8 C)  TempSrc:  Oral  Oral  SpO2: 95%  95%   Weight:      Height:       Physical Exam General: Alert male in NAD Heart: RRR, no murmurs, rubs or gallops Lungs: CTA bilaterally, respirations unlabored Abdomen: Soft, non-distended, +BS Extremities: No edema b/l lower extremities Dialysis Access:  LUE AVF + t/b  Additional Objective Labs: Basic Metabolic Panel: Recent Labs  Lab 08/24/23 1527 08/24/23 2314 08/25/23 0358 08/26/23 0349  NA 137  --  136 136  K 4.5  --  4.2 4.6  CL 94*  --  95* 99  CO2 27  --  25 22  GLUCOSE 198*  --  88 90  BUN 23  --  35* 46*  CREATININE 2.33*  --  2.81* 3.93*  CALCIUM 8.5*  --  8.1* 7.7*  PHOS  --  1.8*  --  6.7*   Liver Function Tests: Recent Labs  Lab 08/24/23 1527  AST 32  ALT 27  ALKPHOS 104  BILITOT 0.7  PROT 6.8  ALBUMIN 2.7*   Recent Labs  Lab 08/24/23 1527  LIPASE 46   CBC: Recent Labs  Lab 08/24/23 1527 08/25/23 0358 08/26/23 0349  WBC 12.5* 9.4 6.1  NEUTROABS 12.0*  --   --   HGB 8.7* 7.2* 7.6*  HCT 29.3* 23.5* 24.7*  MCV 109.3* 106.3* 106.5*  PLT 187 147* 145*   Blood Culture    Component Value Date/Time   SDES BLOOD SITE NOT SPECIFIED 08/24/2023 1915   SPECREQUEST  08/24/2023 1915    BOTTLES DRAWN AEROBIC AND ANAEROBIC Blood Culture results may not be optimal due to an inadequate volume of blood received in culture bottles   CULT  08/24/2023 1915    NO GROWTH < 24 HOURS Performed at [facility], [address], [place], [place] [address]    REPTSTATUS PENDING 08/24/2023 1915    Cardiac Enzymes: No results for input(s): "CKTOTAL", "CKMB", "CKMBINDEX",  "TROPONINI" in the last 168 hours. CBG: Recent Labs  Lab 08/25/23 1931 08/25/23 2030 08/25/23 2308 08/26/23 0326 08/26/23 0745  GLUCAP 56* 109* 89 81 79   Iron Studies: No results for input(s): "IRON", "TIBC", "TRANSFERRIN", "FERRITIN" in the last 72 hours. @lablastinr3 @ Studies/Results: ECHOCARDIOGRAM LIMITED Result Date: 08/25/2023    ECHOCARDIOGRAM LIMITED REPORT   Patient Name:   [name] Date of Exam: 08/25/2023 Medical Rec #:  [id number]    Height:       63.0 in Accession #:    [id number]   Weight:       125.2 lb Date of Birth:  [birth year]   BSA:          1.585 m Patient Age:    67 years     BP:           104/54 mmHg Patient Gender: M            HR:           93 bpm. Exam Location:  Inpatient Procedure: Limited Echo and Limited Color Doppler Indications:  Shock R57.9  History:        Patient has prior history of Echocardiogram examinations, most                 recent 11/18/2022. CHF and Cardiomyopathy, Previous Myocardial                 Infarction, PAD and ESRD, Arrythmias:Atrial Fibrillation,                 Signs/Symptoms:Hypotension; Risk Factors:Hypertension and                 Diabetes.  Sonographer:    Lucendia Herrlich RCS Referring Phys: [id number] GRACE E BOWSER  Sonographer Comments: Image acquisition challenging due to uncooperative patient. IMPRESSIONS  1. Left ventricular ejection fraction, by estimation, is 40 to 45%. The left ventricle has mildly decreased function. The left ventricle demonstrates global hypokinesis.  2. Right ventricular systolic function is normal. The right ventricular size is normal.  3. The mitral valve is normal in structure. Trivial mitral valve regurgitation. No evidence of mitral stenosis.  4. The aortic valve is tricuspid. Aortic valve regurgitation is trivial. FINDINGS  Left Ventricle: Left ventricular ejection fraction, by estimation, is 40 to 45%. The left ventricle has mildly decreased function. The left ventricle demonstrates global hypokinesis.  The left ventricular internal cavity size was normal in size. There is  no left ventricular hypertrophy. Right Ventricle: The right ventricular size is normal. No increase in right ventricular wall thickness. Right ventricular systolic function is normal. Mitral Valve: The mitral valve is normal in structure. Trivial mitral valve regurgitation. No evidence of mitral valve stenosis. Aortic Valve: The aortic valve is tricuspid. Aortic valve regurgitation is trivial. Aorta: The aortic root is normal in size and structure. Venous: The inferior vena cava was not well visualized. 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 Amputation of the fifth toe at the mid metatarsal. 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 No definite osteomyelitis or acute fracture. Calcaneal spurs. Mild diffuse soft tissue swelling. IMPRESSION: No definite osteomyelitis. Electronically Signed   By: Minerva Fester M.D.   On: 08/24/2023 23:10   DG Chest Portable 1 View Result Date: 08/24/2023 CLINICAL DATA:  Hypoxia. Nausea, vomiting and chills during dialysis. EXAM: PORTABLE CHEST 1 VIEW COMPARISON:  09/29/2022 FINDINGS: The heart is within normal limits in size given AP projection and portable technique. The mediastinal and hilar contours are within normal limits. Low lung volumes with streaky bibasilar atelectasis but no infiltrates, edema  effusions. The bony thorax is intact. IMPRESSION: Low lung volumes with streaky bibasilar atelectasis. Electronically Signed  By: Rudie Meyer M.D.   On: 08/24/2023 17:43   Medications:  cefTRIAXone (ROCEPHIN)  IV Stopped (08/25/23 1001)    amiodarone  200 mg Oral Daily   apixaban  2.5 mg Oral BID   atorvastatin  20 mg Oral Daily   calcitRIOL  0.25 mcg Oral Q T,Th,Sa-HD   Chlorhexidine Gluconate Cloth  6 each Topical Daily   Chlorhexidine Gluconate Cloth  6 each Topical Q0600   insulin aspart  0-9 Units Subcutaneous Q4H   midodrine  5 mg Oral TID WC   ondansetron (ZOFRAN) IV  4 mg Intravenous Once   pantoprazole  40 mg Oral BID AC    Dialysis Orders: TTS G-O  3h   2.25 bath  B400  52kg   heparin none  LUA AVF - last HD 1/23, post wt 56.6kg - erratic wt gains, gets to dry wt 50% of the time - rocaltrol 0.25 three times per week - mircera 200 q2, last 1/18, due 2/01  Assessment/Plan: Shock - possible sepsis, PNA. Getting IV abx, initially required levo support for hypotension, now weaned.  L side infiltrates poss PNA.  Acute encephalopathy - seems to be improving ESRD - on HD TTS. Has 1/2 HD Thursday. Labs / vol are stable. Plan is for HD today. Volume - 4kg up by wts, no edema on exam. CXR without edema UF with HD as tolerated.   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 Next esa due 2/01. Follow. Transfuse PRN Secondary hyperparathyroidism - CCa in range and phos is low. Phos initially low, now elevated. Binders on hold for now, follow trend. Cont po vdra three times per week.  Atrial fib - takes eliquis at home HTN - taking low dose BB at home  [facility], PA-C 08/26/2023, 8:38 AM  [facility] Pager: [phone]

## 2023-08-26 NOTE — Hospital Course (Addendum)
Marland Kitchen

## 2023-08-27 DIAGNOSIS — R6521 Severe sepsis with septic shock: Secondary | ICD-10-CM | POA: Diagnosis not present

## 2023-08-27 DIAGNOSIS — A419 Sepsis, unspecified organism: Secondary | ICD-10-CM | POA: Diagnosis not present

## 2023-08-27 LAB — BASIC METABOLIC PANEL
Anion gap: 14 (ref 5–15)
BUN: 25 mg/dL — ABNORMAL HIGH (ref 8–23)
CO2: 26 mmol/L (ref 22–32)
Calcium: 8.2 mg/dL — ABNORMAL LOW (ref 8.9–10.3)
Chloride: 96 mmol/L — ABNORMAL LOW (ref 98–111)
Creatinine, Ser: 2.65 mg/dL — ABNORMAL HIGH (ref 0.61–1.24)
GFR, Estimated: 26 mL/min — ABNORMAL LOW (ref >60)
Glucose, Bld: 80 mg/dL (ref 70–99)
Potassium: 4.3 mmol/L (ref 3.5–5.1)
Sodium: 136 mmol/L (ref 135–145)

## 2023-08-27 LAB — CBC
HCT: 24.6 % — ABNORMAL LOW (ref 39.0–52.0)
Hemoglobin: 7.8 g/dL — ABNORMAL LOW (ref 13.0–17.0)
MCH: 32.8 pg (ref 26.0–34.0)
MCHC: 31.7 g/dL (ref 30.0–36.0)
MCV: 103.4 fL — ABNORMAL HIGH (ref 80.0–100.0)
Platelets: 143 10*3/uL — ABNORMAL LOW (ref 150–400)
RBC: 2.38 MIL/uL — ABNORMAL LOW (ref 4.22–5.81)
RDW: 17 % — ABNORMAL HIGH (ref 11.5–15.5)
WBC: 6.7 10*3/uL (ref 4.0–10.5)
nRBC: 0.3 % — ABNORMAL HIGH (ref 0.0–0.2)

## 2023-08-27 LAB — GLUCOSE, CAPILLARY
Glucose-Capillary: 84 mg/dL (ref 70–99)
Glucose-Capillary: 71 mg/dL (ref 70–99)
Glucose-Capillary: 120 mg/dL — ABNORMAL HIGH (ref 70–99)
Glucose-Capillary: 146 mg/dL — ABNORMAL HIGH (ref 70–99)
Glucose-Capillary: 141 mg/dL — ABNORMAL HIGH (ref 70–99)

## 2023-08-27 LAB — PHOSPHORUS: Phosphorus: 4.8 mg/dL — ABNORMAL HIGH (ref 2.5–4.6)

## 2023-08-27 LAB — MAGNESIUM: Magnesium: 2 mg/dL (ref 1.7–2.4)

## 2023-08-27 MED ORDER — MIDODRINE HCL 5 MG PO TABS
2.5000 mg | ORAL_TABLET | Freq: Two times a day (BID) | ORAL | Status: DC
Start: 2023-08-27 — End: 2023-08-28

## 2023-08-27 NOTE — Progress Notes (Signed)
[facility] Progress Note   Subjective:   Tolerated HD in the unit yesterday with no issues, UF 2L. He reports feeling well. Denies SOB, CP, dizziness, nausea.   Objective Vitals:   08/27/23 0333 08/27/23 0700 08/27/23 0800 08/27/23 0841  BP:  (!) 157/77 (!) 166/80   Pulse:  83 84   Resp:  (!) 27 (!) 30   Temp: 98.9 F (37.2 C)   98.3 F (36.8 C)  TempSrc: Oral   Oral  SpO2:  99% 100%   Weight:      Height:       Physical Exam  General: Alert male in NAD Heart: RRR, no murmurs, rubs or gallops Lungs: CTA bilaterally, respirations unlabored Abdomen: Soft, non-distended, +BS Extremities: No edema b/l lower extremities, L BKA Dialysis Access:  LUE AVF + t/b  Additional Objective Labs: Basic Metabolic Panel: Recent Labs  Lab 08/24/23 2314 08/25/23 0358 08/26/23 0349 08/27/23 0313  NA  --  136 136 136  K  --  4.2 4.6 4.3  CL  --  95* 99 96*  CO2  --  25 22 26   GLUCOSE  --  88 90 80  BUN  --  35* 46* 25*  CREATININE  --  2.81* 3.93* 2.65*  CALCIUM  --  8.1* 7.7* 8.2*  PHOS 1.8*  --  6.7* 4.8*   Liver Function Tests: Recent Labs  Lab 08/24/23 1527  AST 32  ALT 27  ALKPHOS 104  BILITOT 0.7  PROT 6.8  ALBUMIN 2.7*   Recent Labs  Lab 08/24/23 1527  LIPASE 46   CBC: Recent Labs  Lab 08/24/23 1527 08/25/23 0358 08/26/23 0349 08/27/23 0313  WBC 12.5* 9.4 6.1 6.7  NEUTROABS 12.0*  --   --   --   HGB 8.7* 7.2* 7.6* 7.8*  HCT 29.3* 23.5* 24.7* 24.6*  MCV 109.3* 106.3* 106.5* 103.4*  PLT 187 147* 145* 143*   Blood Culture    Component Value Date/Time   SDES IN/OUT CATH URINE 08/25/2023 0427   SPECREQUEST  08/25/2023 0427    NONE Performed at [facility], [address], [place], [place] [address]    CULT 2,000 COLONIES/mL YEAST (A) 08/25/2023 0427   REPTSTATUS 08/26/2023 FINAL 08/25/2023 0427    Cardiac Enzymes: No results for input(s): "CKTOTAL", "CKMB", "CKMBINDEX", "TROPONINI" in the last 168 hours. CBG: Recent Labs   Lab 08/26/23 1848 08/26/23 1912 08/26/23 2256 08/27/23 0333 08/27/23 0758  GLUCAP 120* 112* 86 84 71   Iron Studies: No results for input(s): "IRON", "TIBC", "TRANSFERRIN", "FERRITIN" in the last 72 hours. @lablastinr3 @ Studies/Results: ECHOCARDIOGRAM LIMITED Result Date: 08/25/2023    ECHOCARDIOGRAM LIMITED REPORT   Patient Name:   TY BUNTROCK Date of Exam: 08/25/2023 Medical Rec #:  [id number]    Height:       63.0 in Accession #:    [id number]   Weight:       125.2 lb Date of Birth:  Jul 01, 1956   BSA:          1.585 m Patient Age:    68 years     BP:           104/54 mmHg Patient Gender: M            HR:           93 bpm. Exam Location:  Inpatient Procedure: Limited Echo and Limited Color Doppler Indications:    Shock R57.9  History:  Patient has prior history of Echocardiogram examinations, most                 recent 11/18/2022. CHF and Cardiomyopathy, Previous Myocardial                 Infarction, PAD and ESRD, Arrythmias:Atrial Fibrillation,                 Signs/Symptoms:Hypotension; Risk Factors:Hypertension and                 Diabetes.  Sonographer:    Lucendia Herrlich RCS Referring Phys: [id number] GRACE E BOWSER  Sonographer Comments: Image acquisition challenging due to uncooperative patient. IMPRESSIONS  1. Left ventricular ejection fraction, by estimation, is 40 to 45%. The left ventricle has mildly decreased function. The left ventricle demonstrates global hypokinesis.  2. Right ventricular systolic function is normal. The right ventricular size is normal.  3. The mitral valve is normal in structure. Trivial mitral valve regurgitation. No evidence of mitral stenosis.  4. The aortic valve is tricuspid. Aortic valve regurgitation is trivial. FINDINGS  Left Ventricle: Left ventricular ejection fraction, by estimation, is 40 to 45%. The left ventricle has mildly decreased function. The left ventricle demonstrates global hypokinesis. The left ventricular internal cavity size was normal  in size. There is  no left ventricular hypertrophy. Right Ventricle: The right ventricular size is normal. No increase in right ventricular wall thickness. Right ventricular systolic function is normal. Mitral Valve: The mitral valve is normal in structure. Trivial mitral valve regurgitation. No evidence of mitral valve stenosis. Aortic Valve: The aortic valve is tricuspid. Aortic valve regurgitation is trivial. Aorta: The aortic root is normal in size and structure. Venous: The inferior vena cava was not well visualized. LEFT VENTRICLE PLAX 2D LVIDd:         4.10 cm LVIDs:         3.70 cm LV PW:         1.00 cm LV IVS:        0.70 cm LVOT diam:     1.80 cm LVOT Area:     2.54 cm  LEFT ATRIUM         Index LA diam:    4.30 cm 2.71 cm/m   AORTA Ao Root diam: 3.10 cm Ao Asc diam:  3.10 cm  SHUNTS Systemic Diam: 1.80 cm [name] MD Electronically signed by [name] MD Signature Date/Time: 08/25/2023/3:27:08 PM    Final    Medications:  cefTRIAXone (ROCEPHIN)  IV Stopped (08/26/23 1116)    amiodarone  200 mg Oral Daily   apixaban  2.5 mg Oral BID   atorvastatin  20 mg Oral Daily   calcitRIOL  0.25 mcg Oral Q T,Th,Sa-HD   Chlorhexidine Gluconate Cloth  6 each Topical Daily   Chlorhexidine Gluconate Cloth  6 each Topical Q0600   insulin aspart  0-9 Units Subcutaneous Q4H   midodrine  5 mg Oral TID WC   ondansetron (ZOFRAN) IV  4 mg Intravenous Once   pantoprazole  40 mg Oral BID AC    Dialysis Orders: TTS G-O  3h   2.25 bath  B400  52kg   heparin none  LUA AVF - last HD 1/23, post wt 56.6kg - erratic wt gains, gets to dry wt 50% of the time - rocaltrol 0.25 three times per week - mircera 200 q2, last 1/18, due 2/01  Assessment/Plan: Shock - possible sepsis, PNA. Getting IV abx, initially required levo support for  hypotension, now weaned.  L side infiltrates on CXR, abx per admitting team.  Acute encephalopathy - seems to be improving ESRD - on HD TTS. Has 1/2 HD Thursday.  Labs / vol are stable. Completed HD Saturday, next HD Tuesday Volume -  CXR without edema, UF with HD as tolerated.   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 Next esa due 2/01. Follow. Transfuse PRN Secondary hyperparathyroidism - CCa in range and phos was initially low, now at goal. Binders on hold for now, follow trend. Cont po vdra three times per week.  Atrial fib - takes eliquis at home HTN - taking low dose BB at home  [facility], PA-C 08/27/2023, 9:10 AM  [facility] Pager: [phone]

## 2023-08-27 NOTE — Progress Notes (Signed)
PROGRESS NOTE  Samer Dutton ZOX:[id number] DOB: 01/31/1956 DOA: 08/24/2023 PCP: Patient, No Pcp Per   LOS: 3 days   Brief narrative:  68 year old male with past medical history of end-stage renal disease on Tuesday, Thursday Saturday, diabetes mellitus type 2, GI bleed, atrial fibrillation on Eliquis, s/p R toe amputations and L BKA currently at a skilled nursing facility presented to the hospital with vomiting multiple episodes with some watery diarrhea.  In the ED patient was noted to be febrile with temperature of 100.4 F with mild leukocytosis and soft blood pressure.  He was empirically treated with IV antibiotics and vasopressors.  Initiated for hypotension.  Initially was admitted to the ICU but subsequently got better so was transferred out of the ICU to medical team.    Assessment/Plan: Principal Problem:   Septic shock (HCC) Active Problems:   Encephalopathy acute   Lactic acidosis  Undifferentiated shock with lactic acidosis Likely hypovolemic shock, but septic shock and cardiogenic not ruled out.  Chest x-ray showed some left lower lobe consolidation likely secondary to pneumonia.  Urinalysis with leukocytes but no other symptoms.  Diarrhea has improved.  COVID influenza and RSV was negative.  C. difficile was negative.  Lactic acid down trended.  Procalcitonin was elevated to 6.4 on presentation.  On Rocephin for pneumonia.  Blood cultures showing Staphylococcus hominis likely contaminant.  Urine culture with colonies of yeast.  C. difficile  was negative.   Was initially on Levophed which was discontinued and has been started on midodrine 5 mg 3 times daily.  Blood pressure is better at this time.  Afebrile at this time with no leukocytosis.  Did have leukocytosis on presentation.  Hepatitis B surface antigen none reactive.  Blood culture showing staph species.  Will continue to monitor blood cultures.  Was on midodrine 5 mg 3 times daily will decrease to 2.5 twice daily since the  blood pressure had started to improve.  Possible left lower lobe pneumonia.  On Rocephin IV at this time.  On supplemental oxygen as well.  Will continue to wean as able.  Currently on 4 L of oxygen by nasal cannula.  Acute metabolic encephalopathy, improved Likely secondary to sepsis, continue delirium precautions.  Stable alert awake and Communicative.   Afib on eliquis Hypertension Chronic systolic congestive heart failure.  2D echocardiogram 08/25/2023 showed LV ejection fraction of 40 to 45% with global hypokinesis. BNP elevated to 809.  Continue amiodarone, Eliquis.  On 4 L of oxygen at this time.   ESRD on HD TThS Was sent to ED from HD and did not get hemodialysis as outpatient.  Nephrology on board for hemodialysis needs.   Type 2 diabetes melitis Continue with blood glucose monitoring, sliding scale insulin.  Hemoglobin A1c was 4.0.   Hx of GIB Anemia of chronic disease Continue PPI.   R heel wound - xray with no evidence of osteomyelitis, continue local wound care.  Pressure injury stage I to the coccyx and stage II to the right heel present on admission.  Continue wound care. Pressure Injury 08/24/23 Coccyx Medial;Upper Stage 1 -  Intact skin with non-blanchable redness of a localized area usually over a bony prominence. Pink, red, no drainage (Active)  08/24/23 2225  Location: Coccyx  Location Orientation: Medial;Upper  Staging: Stage 1 -  Intact skin with non-blanchable redness of a localized area usually over a bony prominence.  Wound Description (Comments): Pink, red, no drainage (Image in chart)  Present on Admission: Yes  Pressure Injury 08/25/23 Heel Right Stage 2 -  Partial thickness loss of dermis presenting as a shallow open injury with a red, pink wound bed without slough. (Active)  08/25/23   Location: Heel  Location Orientation: Right  Staging: Stage 2 -  Partial thickness loss of dermis presenting as a shallow open injury with a red, pink wound bed  without slough.  Wound Description (Comments):   Present on Admission: Yes      DVT prophylaxis: apixaban (ELIQUIS) tablet 2.5 mg Start: 08/25/23 1000 apixaban (ELIQUIS) tablet 2.5 mg   Disposition: Likely skilled nursing facility in 1 to 2 days. Patient from skilled nursing facility.  Pending PT OT consultation.  Status is: Inpatient  Remains inpatient appropriate because: Pending clinical improvement, IV antibiotic, need for skilled nursing facility.    Code Status:     Code Status: Full Code  Family Communication: Spoke with the patient's sister on the phone on 08/26/2023   Consultants: PCCM Nephrology  Procedures: Hemodialysis  Anti-infectives:  Rocephin IV  Anti-infectives (From admission, onward)    Start     Dose/Rate Route Frequency Ordered Stop   08/25/23 1400  piperacillin-tazobactam (ZOSYN) IVPB 2.25 g  Status:  Discontinued        2.25 g 100 mL/hr over 30 Minutes Intravenous Every 8 hours 08/24/23 2151 08/25/23 0840   08/25/23 0930  cefTRIAXone (ROCEPHIN) 1 g in sodium chloride 0.9 % 100 mL IVPB        1 g 200 mL/hr over 30 Minutes Intravenous Every 24 hours 08/25/23 0840     08/24/23 1845  vancomycin (VANCOCIN) IVPB 1000 mg/200 mL premix        1,000 mg 200 mL/hr over 60 Minutes Intravenous  Once 08/24/23 1831 08/24/23 2041   08/24/23 1830  vancomycin (VANCOREADY) IVPB 750 mg/150 mL  Status:  Discontinued        750 mg 150 mL/hr over 60 Minutes Intravenous  Once 08/24/23 1829 08/24/23 1831   08/24/23 1830  ceFEPIme (MAXIPIME) 2 g in sodium chloride 0.9 % 100 mL IVPB        2 g 200 mL/hr over 30 Minutes Intravenous  Once 08/24/23 1829 08/24/23 1953       Subjective: Today, patient was seen and examined at bedside.  Patient states that he passed some gas and wants to eat some grits today.  Denies any nausea vomiting fever chills or rigor.  Objective: Vitals:   08/27/23 0800 08/27/23 0841  BP: (!) 166/80   Pulse: 84   Resp: (!) 30   Temp:  98.3  F (36.8 C)  SpO2: 100%     Intake/Output Summary (Last 24 hours) at 08/27/2023 1045 Last data filed at 08/26/2023 1800 Gross per 24 hour  Intake 100 ml  Output 2000 ml  Net -1900 ml   Filed Weights   08/25/23 0420 08/26/23 1347 08/26/23 1725  Weight: 56.8 kg 58.6 kg 56.6 kg   Body mass index is 22.1 kg/m.   Physical Exam:  GENERAL: Patient is alert awake and Communicative. Not in obvious distress.  On nasal cannula oxygen.   HENT: No scleral pallor or icterus. Pupils equally reactive to light. Oral mucosa is moist NECK: is supple, no gross swelling noted. CHEST: Diminished breath sounds bilaterally.  Clear breath sounds noted. CVS: S1 and S2 heard, no murmur. Regular rate and rhythm.  ABDOMEN: Soft, non-tender, bowel sounds are present. EXTREMITIES: Left below-knee amputation.  Left upper extremity AV fistula in place.  No edema. CNS:  Cranial nerves are intact. No focal motor deficits. SKIN: warm and dry, pressure injury to the coccyx and right heel.  Data Review: I have personally reviewed the following laboratory data and studies,  CBC: Recent Labs  Lab 08/24/23 1527 08/25/23 0358 08/26/23 0349 08/27/23 0313  WBC 12.5* 9.4 6.1 6.7  NEUTROABS 12.0*  --   --   --   HGB 8.7* 7.2* 7.6* 7.8*  HCT 29.3* 23.5* 24.7* 24.6*  MCV 109.3* 106.3* 106.5* 103.4*  PLT 187 147* 145* 143*   Basic Metabolic Panel: Recent Labs  Lab 08/24/23 1527 08/24/23 2314 08/25/23 0358 08/26/23 0349 08/27/23 0313  NA 137  --  136 136 136  K 4.5  --  4.2 4.6 4.3  CL 94*  --  95* 99 96*  CO2 27  --  25 22 26   GLUCOSE 198*  --  88 90 80  BUN 23  --  35* 46* 25*  CREATININE 2.33*  --  2.81* 3.93* 2.65*  CALCIUM 8.5*  --  8.1* 7.7* 8.2*  MG  --  1.6*  --   --  2.0  PHOS  --  1.8*  --  6.7* 4.8*   Liver Function Tests: Recent Labs  Lab 08/24/23 1527  AST 32  ALT 27  ALKPHOS 104  BILITOT 0.7  PROT 6.8  ALBUMIN 2.7*   Recent Labs  Lab 08/24/23 1527  LIPASE 46   No results  for input(s): "AMMONIA" in the last 168 hours. Cardiac Enzymes: No results for input(s): "CKTOTAL", "CKMB", "CKMBINDEX", "TROPONINI" in the last 168 hours. BNP (last 3 results) Recent Labs    09/22/22 1633 09/27/22 0035 08/24/23 2149  BNP 3,189.2* 3,505.9* 809.2*    ProBNP (last 3 results) No results for input(s): "PROBNP" in the last 8760 hours.  CBG: Recent Labs  Lab 08/26/23 1848 08/26/23 1912 08/26/23 2256 08/27/23 0333 08/27/23 0758  GLUCAP 120* 112* 86 84 71   Recent Results (from the past 240 hours)  Resp panel by RT-PCR (RSV, Flu A&B, Covid) Anterior Nasal Swab     Status: None   Collection Time: 08/24/23  4:44 PM   Specimen: Anterior Nasal Swab  Result Value Ref Range Status   SARS Coronavirus 2 by RT PCR NEGATIVE NEGATIVE Final   Influenza A by PCR NEGATIVE NEGATIVE Final   Influenza B by PCR NEGATIVE NEGATIVE Final    Comment: (NOTE) The Xpert Xpress SARS-CoV-2/FLU/RSV plus assay is intended as an aid in the diagnosis of influenza from Nasopharyngeal swab specimens and should not be used as a sole basis for treatment. Nasal washings and aspirates are unacceptable for Xpert Xpress SARS-CoV-2/FLU/RSV testing.  Fact Sheet for Patients: BloggerCourse.com  Fact Sheet for Healthcare Providers: SeriousBroker.it  This test is not yet approved or cleared by the [place] FDA and has been authorized for detection and/or diagnosis of SARS-CoV-2 by FDA under an Emergency Use Authorization (EUA). This EUA will remain in effect (meaning this test can be used) for the duration of the COVID-19 declaration under Section 564(b)(1) of the Act, 21 U.S.C. section 360bbb-3(b)(1), unless the authorization is terminated or revoked.     Resp Syncytial Virus by PCR NEGATIVE NEGATIVE Final    Comment: (NOTE) Fact Sheet for Patients: BloggerCourse.com  Fact Sheet for Healthcare  Providers: SeriousBroker.it  This test is not yet approved or cleared by the [place] FDA and has been authorized for detection and/or diagnosis of SARS-CoV-2 by FDA under an Emergency Use Authorization (EUA). This EUA  will remain in effect (meaning this test can be used) for the duration of the COVID-19 declaration under Section 564(b)(1) of the Act, 21 U.S.C. section 360bbb-3(b)(1), unless the authorization is terminated or revoked.  Performed at [facility], [address] [place], [place] [address]   Blood culture (routine x 2)     Status: Abnormal (Preliminary result)   Collection Time: 08/24/23  7:00 PM   Specimen: BLOOD  Result Value Ref Range Status   Specimen Description BLOOD SITE NOT SPECIFIED  Final   Special Requests   Final    BOTTLES DRAWN AEROBIC AND ANAEROBIC Blood Culture results may not be optimal due to an inadequate volume of blood received in culture bottles   Culture  Setup Time   Final    GRAM POSITIVE COCCI IN CLUSTERS IN BOTH AEROBIC AND ANAEROBIC BOTTLES CRITICAL RESULT CALLED TO, READ BACK BY AND VERIFIED WITH: PHARMD PAYTES [id number] @ 1645 fh    Culture (A)  Final    STAPHYLOCOCCUS HOMINIS THE SIGNIFICANCE OF ISOLATING THIS ORGANISM FROM A SINGLE SET OF BLOOD CULTURES WHEN MULTIPLE SETS ARE DRAWN IS UNCERTAIN. PLEASE NOTIFY THE MICROBIOLOGY DEPARTMENT WITHIN ONE WEEK IF SPECIATION AND SENSITIVITIES ARE REQUIRED. Performed at [facility], [address], [place], [place] [address]    Report Status PENDING  Incomplete  Blood Culture ID Panel (Reflexed)     Status: Abnormal   Collection Time: 08/24/23  7:00 PM  Result Value Ref Range Status   Enterococcus faecalis NOT DETECTED NOT DETECTED Final   Enterococcus Faecium NOT DETECTED NOT DETECTED Final   Listeria monocytogenes NOT DETECTED NOT DETECTED Final   Staphylococcus species DETECTED (A) NOT DETECTED Final    Comment: CRITICAL RESULT CALLED TO, READ BACK  BY AND VERIFIED WITH: PHARMD PAYTES [id number] @ 1645 fh    Staphylococcus aureus (BCID) NOT DETECTED NOT DETECTED Final   Staphylococcus epidermidis NOT DETECTED NOT DETECTED Final   Staphylococcus lugdunensis NOT DETECTED NOT DETECTED Final   Streptococcus species NOT DETECTED NOT DETECTED Final   Streptococcus agalactiae NOT DETECTED NOT DETECTED Final   Streptococcus pneumoniae NOT DETECTED NOT DETECTED Final   Streptococcus pyogenes NOT DETECTED NOT DETECTED Final   A.calcoaceticus-baumannii NOT DETECTED NOT DETECTED Final   Bacteroides fragilis NOT DETECTED NOT DETECTED Final   Enterobacterales NOT DETECTED NOT DETECTED Final   Enterobacter cloacae complex NOT DETECTED NOT DETECTED Final   Escherichia coli NOT DETECTED NOT DETECTED Final   Klebsiella aerogenes NOT DETECTED NOT DETECTED Final   Klebsiella oxytoca NOT DETECTED NOT DETECTED Final   Klebsiella pneumoniae NOT DETECTED NOT DETECTED Final   Proteus species NOT DETECTED NOT DETECTED Final   Salmonella species NOT DETECTED NOT DETECTED Final   Serratia marcescens NOT DETECTED NOT DETECTED Final   Haemophilus influenzae NOT DETECTED NOT DETECTED Final   Neisseria meningitidis NOT DETECTED NOT DETECTED Final   Pseudomonas aeruginosa NOT DETECTED NOT DETECTED Final   Stenotrophomonas maltophilia NOT DETECTED NOT DETECTED Final   Candida albicans NOT DETECTED NOT DETECTED Final   Candida auris NOT DETECTED NOT DETECTED Final   Candida glabrata NOT DETECTED NOT DETECTED Final   Candida krusei NOT DETECTED NOT DETECTED Final   Candida parapsilosis NOT DETECTED NOT DETECTED Final   Candida tropicalis NOT DETECTED NOT DETECTED Final   Cryptococcus neoformans/gattii NOT DETECTED NOT DETECTED Final    Comment: Performed at [facility], [address], [place], [place] [address]  Blood culture (routine x 2)  Status: None (Preliminary result)   Collection Time: 08/24/23  7:15 PM   Specimen: BLOOD  Result Value Ref Range  Status   Specimen Description BLOOD SITE NOT SPECIFIED  Final   Special Requests   Final    BOTTLES DRAWN AEROBIC AND ANAEROBIC Blood Culture results may not be optimal due to an inadequate volume of blood received in culture bottles   Culture  Setup Time   Final    GRAM POSITIVE RODS AEROBIC BOTTLE ONLY CRITICAL RESULT CALLED TO, READ BACK BY AND VERIFIED WITH: PHARMD JENNIE Z. [id number], ADC  Performed at [facility], [address] [place], [place] [address]    Culture GRAM POSITIVE RODS  Final   Report Status PENDING  Incomplete  MRSA Next Gen by PCR, Nasal     Status: None   Collection Time: 08/24/23  8:13 PM   Specimen: Nasal Mucosa; Nasal Swab  Result Value Ref Range Status   MRSA by PCR Next Gen NOT DETECTED NOT DETECTED Final    Comment: (NOTE) The GeneXpert MRSA Assay (FDA approved for NASAL specimens only), is one component of a comprehensive MRSA colonization surveillance program. It is not intended to diagnose MRSA infection nor to guide or monitor treatment for MRSA infections. Test performance is not FDA approved in patients less than 43 years old. Performed at [facility], [address] [place], [place] [address]   C Difficile Quick Screen w PCR reflex     Status: None   Collection Time: 08/24/23 10:30 PM   Specimen: STOOL  Result Value Ref Range Status   C Diff antigen NEGATIVE NEGATIVE Final   C Diff toxin NEGATIVE NEGATIVE Final   C Diff interpretation No C. difficile detected.  Final    Comment: Performed at [facility], [address], [place], [place] [address]  Urine Culture     Status: Abnormal   Collection Time: 08/25/23  4:27 AM   Specimen: In/Out Cath Urine  Result Value Ref Range Status   Specimen Description IN/OUT CATH URINE  Final   Special Requests   Final    NONE Performed at [facility], [address], [place], [place] [address]    Culture 2,000 COLONIES/mL YEAST (A)  Final   Report Status 08/26/2023 FINAL   Final     Studies: No results found.     Joycelyn Das, MD  [facility] 08/27/2023  If 7PM-7AM, please contact night-coverage

## 2023-08-27 NOTE — Progress Notes (Signed)
Pt transferred to 2W room 27 without any complication all belongings place at bedside.

## 2023-08-27 NOTE — Plan of Care (Signed)
Pt alert x3, 3-4L-o2, anuric, HD-T, TH, Sat

## 2023-08-28 DIAGNOSIS — R6521 Severe sepsis with septic shock: Secondary | ICD-10-CM | POA: Diagnosis not present

## 2023-08-28 DIAGNOSIS — A419 Sepsis, unspecified organism: Secondary | ICD-10-CM | POA: Diagnosis not present

## 2023-08-28 DIAGNOSIS — L89502 Pressure ulcer of unspecified ankle, stage 2: Secondary | ICD-10-CM | POA: Diagnosis present

## 2023-08-28 LAB — BASIC METABOLIC PANEL
Anion gap: 14 (ref 5–15)
BUN: 41 mg/dL — ABNORMAL HIGH (ref 8–23)
CO2: 28 mmol/L (ref 22–32)
Calcium: 8.5 mg/dL — ABNORMAL LOW (ref 8.9–10.3)
Chloride: 96 mmol/L — ABNORMAL LOW (ref 98–111)
Creatinine, Ser: 4.13 mg/dL — ABNORMAL HIGH (ref 0.61–1.24)
GFR, Estimated: 15 mL/min — ABNORMAL LOW (ref >60)
Glucose, Bld: 90 mg/dL (ref 70–99)
Potassium: 4.1 mmol/L (ref 3.5–5.1)
Sodium: 138 mmol/L (ref 135–145)

## 2023-08-28 LAB — CBC
HCT: 26.9 % — ABNORMAL LOW (ref 39.0–52.0)
Hemoglobin: 8.3 g/dL — ABNORMAL LOW (ref 13.0–17.0)
MCH: 32.7 pg (ref 26.0–34.0)
MCHC: 30.9 g/dL (ref 30.0–36.0)
MCV: 105.9 fL — ABNORMAL HIGH (ref 80.0–100.0)
Platelets: 142 10*3/uL — ABNORMAL LOW (ref 150–400)
RBC: 2.54 MIL/uL — ABNORMAL LOW (ref 4.22–5.81)
RDW: 17 % — ABNORMAL HIGH (ref 11.5–15.5)
WBC: 6.8 10*3/uL (ref 4.0–10.5)
nRBC: 0 % (ref 0.0–0.2)

## 2023-08-28 LAB — GLUCOSE, CAPILLARY
Glucose-Capillary: 106 mg/dL — ABNORMAL HIGH (ref 70–99)
Glucose-Capillary: 107 mg/dL — ABNORMAL HIGH (ref 70–99)
Glucose-Capillary: 125 mg/dL — ABNORMAL HIGH (ref 70–99)
Glucose-Capillary: 133 mg/dL — ABNORMAL HIGH (ref 70–99)
Glucose-Capillary: 88 mg/dL (ref 70–99)
Glucose-Capillary: 94 mg/dL (ref 70–99)

## 2023-08-28 LAB — CULTURE, BLOOD (ROUTINE X 2): Culture  Setup Time: NO GROWTH

## 2023-08-28 LAB — PHOSPHORUS: Phosphorus: 5.1 mg/dL — ABNORMAL HIGH (ref 2.5–4.6)

## 2023-08-28 NOTE — Progress Notes (Signed)
PROGRESS NOTE  [name] ZOX:[id number] DOB: [birth year] DOA: 08/24/2023 PCP: Patient, No Pcp Per   LOS: 4 days   Brief narrative:  68 year old male with past medical history of end-stage renal disease on Tuesday, Thursday Saturday, diabetes mellitus type 2, GI bleed, atrial fibrillation on Eliquis, s/p R toe amputations and L BKA currently at a skilled nursing facility presented to the hospital with vomiting multiple episodes with some watery diarrhea.  In the ED patient was noted to be febrile with temperature of 100.4 F with mild leukocytosis and soft blood pressure.  He was empirically treated with IV antibiotics and vasopressors.  Initiated for hypotension.  Initially was admitted to the ICU but subsequently got better so was transferred out of the ICU to medical team.    Assessment/Plan: Principal Problem:   Septic shock (HCC) Active Problems:   Encephalopathy acute   Lactic acidosis   Pressure injury of ankle, stage 2 (HCC)  Undifferentiated shock with lactic acidosis Likely hypovolemic shock, but septic shock and cardiogenic not ruled out.  Chest x-ray showed some left lower lobe consolidation likely secondary to pneumonia.  Urinalysis with leukocytes but no other symptoms.  Diarrhea has improved.  COVID influenza and RSV was negative.  C. difficile was negative.  Lactic acid down trended.  Procalcitonin was elevated to 6.4 on presentation.  On Rocephin for pneumonia.  Blood cultures showing Staphylococcus hominis likely contaminant.  Urine culture with colonies of yeast.  C. difficile  was negative.   Was initially on Levophed which was discontinued and has been started on midodrine 5 mg 3 times daily.  Blood pressure is better at this time and is elevated so we will discontinue midodrine from today..  Afebrile at this time with no leukocytosis.  Did have leukocytosis on presentation.  Hepatitis B surface antigen none reactive.  Blood culture showing staph species, likely to be a  contaminant.  Possible left lower lobe pneumonia.  On Rocephin IV at this time.  On supplemental oxygen as well.  Will continue to wean as able.  Currently on 3 L of oxygen by nasal cannula.  Acute metabolic encephalopathy, improved Likely secondary to sepsis, continue delirium precautions.  Stable alert awake and Communicative.   Afib on eliquis Hypertension Chronic systolic congestive heart failure.  2D echocardiogram 08/25/2023 showed LV ejection fraction of 40 to 45% with global hypokinesis. BNP elevated to 809.  Continue amiodarone, Eliquis.  On 3 L of oxygen at this time.   ESRD on HD TThS Was sent to ED from HD and did not get hemodialysis as outpatient.  Nephrology on board for hemodialysis needs.   Type 2 diabetes melitis Continue with blood glucose monitoring, sliding scale insulin.  Hemoglobin A1c was 4.0.   Hx of GIB Anemia of chronic disease Continue PPI.   R heel wound - xray with no evidence of osteomyelitis, continue local wound care.  Pressure injury stage I to the coccyx and stage II to the right heel present on admission.  Continue wound care. Pressure Injury 08/24/23 Coccyx Medial;Upper Stage 1 -  Intact skin with non-blanchable redness of a localized area usually over a bony prominence. Pink, red, no drainage (Active)  08/24/23 2225  Location: Coccyx  Location Orientation: Medial;Upper  Staging: Stage 1 -  Intact skin with non-blanchable redness of a localized area usually over a bony prominence.  Wound Description (Comments): Pink, red, no drainage (Image in chart)  Present on Admission: Yes     Pressure Injury 08/25/23 Heel Right Stage  2 -  Partial thickness loss of dermis presenting as a shallow open injury with a red, pink wound bed without slough. (Active)  08/25/23   Location: Heel  Location Orientation: Right  Staging: Stage 2 -  Partial thickness loss of dermis presenting as a shallow open injury with a red, pink wound bed without slough.  Wound  Description (Comments):   Present on Admission: Yes      DVT prophylaxis: apixaban (ELIQUIS) tablet 2.5 mg Start: 08/25/23 1000 apixaban (ELIQUIS) tablet 2.5 mg   Disposition: Likely skilled nursing facility in 1 to 2 days. Patient from skilled nursing facility.  Pending PT, OT consultation.  Will reach out to [facility].  Status is: Inpatient  Remains inpatient appropriate because: Pending clinical improvement, IV antibiotic, need for skilled nursing facility.    Code Status:     Code Status: Full Code  Family Communication: Spoke with the patient's sister on the phone on 08/26/2023   Consultants: PCCM Nephrology  Procedures: Hemodialysis  Anti-infectives:  Rocephin IV  Anti-infectives (From admission, onward)    Start     Dose/Rate Route Frequency Ordered Stop   08/25/23 1400  piperacillin-tazobactam (ZOSYN) IVPB 2.25 g  Status:  Discontinued        2.25 g 100 mL/hr over 30 Minutes Intravenous Every 8 hours 08/24/23 2151 08/25/23 0840   08/25/23 0930  cefTRIAXone (ROCEPHIN) 1 g in sodium chloride 0.9 % 100 mL IVPB        1 g 200 mL/hr over 30 Minutes Intravenous Every 24 hours 08/25/23 0840     08/24/23 1845  vancomycin (VANCOCIN) IVPB 1000 mg/200 mL premix        1,000 mg 200 mL/hr over 60 Minutes Intravenous  Once 08/24/23 1831 08/24/23 2041   08/24/23 1830  vancomycin (VANCOREADY) IVPB 750 mg/150 mL  Status:  Discontinued        750 mg 150 mL/hr over 60 Minutes Intravenous  Once 08/24/23 1829 08/24/23 1831   08/24/23 1830  ceFEPIme (MAXIPIME) 2 g in sodium chloride 0.9 % 100 mL IVPB        2 g 200 mL/hr over 30 Minutes Intravenous  Once 08/24/23 1829 08/24/23 1953       Subjective: Today, patient was seen and examined at bedside.  Patient denies any nausea vomiting fever chills or rigor.  Still have some cough but denies any chest pain or dyspnea.    Objective: Vitals:   08/28/23 0445 08/28/23 0801  BP: (!) 156/68 (!) 140/65  Pulse: 72 86  Resp: 19 18  Temp:  98.6 F (37 C) 98.4 F (36.9 C)  SpO2: 100% 100%   No intake or output data in the 24 hours ending 08/28/23 1047  Filed Weights   08/26/23 1347 08/26/23 1725 08/28/23 0400  Weight: 58.6 kg 56.6 kg 56 kg   Body mass index is 21.87 kg/m.   Physical Exam:  GENERAL: Patient is alert awake and Communicative. Not in obvious distress.  On nasal cannula oxygen.   HENT: No scleral pallor or icterus. Pupils equally reactive to light. Oral mucosa is moist NECK: is supple, no gross swelling noted. CHEST: Diminished breath sounds bilaterally.  Coarse breath sounds noted. CVS: S1 and S2 heard, no murmur. Regular rate and rhythm.  ABDOMEN: Soft, non-tender, bowel sounds are present. EXTREMITIES: Left below-knee amputation.  Left upper extremity AV fistula in place.   CNS: Cranial nerves are intact. No focal motor deficits. SKIN: warm and dry, pressure injury to the coccyx and  right heel.  Data Review: I have personally reviewed the following laboratory data and studies,  CBC: Recent Labs  Lab 08/24/23 1527 08/25/23 0358 08/26/23 0349 08/27/23 0313 08/28/23 0657  WBC 12.5* 9.4 6.1 6.7 6.8  NEUTROABS 12.0*  --   --   --   --   HGB 8.7* 7.2* 7.6* 7.8* 8.3*  HCT 29.3* 23.5* 24.7* 24.6* 26.9*  MCV 109.3* 106.3* 106.5* 103.4* 105.9*  PLT 187 147* 145* 143* 142*   Basic Metabolic Panel: Recent Labs  Lab 08/24/23 1527 08/24/23 2314 08/25/23 0358 08/26/23 0349 08/27/23 0313 08/28/23 0657  NA 137  --  136 136 136 138  K 4.5  --  4.2 4.6 4.3 4.1  CL 94*  --  95* 99 96* 96*  CO2 27  --  25 22 26 28   GLUCOSE 198*  --  88 90 80 90  BUN 23  --  35* 46* 25* 41*  CREATININE 2.33*  --  2.81* 3.93* 2.65* 4.13*  CALCIUM 8.5*  --  8.1* 7.7* 8.2* 8.5*  MG  --  1.6*  --   --  2.0  --   PHOS  --  1.8*  --  6.7* 4.8* 5.1*   Liver Function Tests: Recent Labs  Lab 08/24/23 1527  AST 32  ALT 27  ALKPHOS 104  BILITOT 0.7  PROT 6.8  ALBUMIN 2.7*   Recent Labs  Lab 08/24/23 1527   LIPASE 46   No results for input(s): "AMMONIA" in the last 168 hours. Cardiac Enzymes: No results for input(s): "CKTOTAL", "CKMB", "CKMBINDEX", "TROPONINI" in the last 168 hours. BNP (last 3 results) Recent Labs    09/22/22 1633 09/27/22 0035 08/24/23 2149  BNP 3,189.2* 3,505.9* 809.2*    ProBNP (last 3 results) No results for input(s): "PROBNP" in the last 8760 hours.  CBG: Recent Labs  Lab 08/27/23 1610 08/27/23 2106 08/28/23 0053 08/28/23 0439 08/28/23 0759  GLUCAP 146* 141* 88 94 107*   Recent Results (from the past 240 hours)  Resp panel by RT-PCR (RSV, Flu A&B, Covid) Anterior Nasal Swab     Status: None   Collection Time: 08/24/23  4:44 PM   Specimen: Anterior Nasal Swab  Result Value Ref Range Status   SARS Coronavirus 2 by RT PCR NEGATIVE NEGATIVE Final   Influenza A by PCR NEGATIVE NEGATIVE Final   Influenza B by PCR NEGATIVE NEGATIVE Final    Comment: (NOTE) The Xpert Xpress SARS-CoV-2/FLU/RSV plus assay is intended as an aid in the diagnosis of influenza from Nasopharyngeal swab specimens and should not be used as a sole basis for treatment. Nasal washings and aspirates are unacceptable for Xpert Xpress SARS-CoV-2/FLU/RSV testing.  Fact Sheet for Patients: BloggerCourse.com  Fact Sheet for Healthcare Providers: SeriousBroker.it  This test is not yet approved or cleared by the [place] FDA and has been authorized for detection and/or diagnosis of SARS-CoV-2 by FDA under an Emergency Use Authorization (EUA). This EUA will remain in effect (meaning this test can be used) for the duration of the COVID-19 declaration under Section 564(b)(1) of the Act, 21 U.S.C. section 360bbb-3(b)(1), unless the authorization is terminated or revoked.     Resp Syncytial Virus by PCR NEGATIVE NEGATIVE Final    Comment: (NOTE) Fact Sheet for Patients: BloggerCourse.com  Fact Sheet for  Healthcare Providers: SeriousBroker.it  This test is not yet approved or cleared by the [place] FDA and has been authorized for detection and/or diagnosis of SARS-CoV-2 by FDA under an  Emergency Use Authorization (EUA). This EUA will remain in effect (meaning this test can be used) for the duration of the COVID-19 declaration under Section 564(b)(1) of the Act, 21 U.S.C. section 360bbb-3(b)(1), unless the authorization is terminated or revoked.  Performed at [facility], [address], [place], [place] [address]   Blood culture (routine x 2)     Status: Abnormal (Preliminary result)   Collection Time: 08/24/23  7:00 PM   Specimen: BLOOD  Result Value Ref Range Status   Specimen Description BLOOD SITE NOT SPECIFIED  Final   Special Requests   Final    BOTTLES DRAWN AEROBIC AND ANAEROBIC Blood Culture results may not be optimal due to an inadequate volume of blood received in culture bottles   Culture  Setup Time   Final    GRAM POSITIVE COCCI IN CLUSTERS IN BOTH AEROBIC AND ANAEROBIC BOTTLES CRITICAL RESULT CALLED TO, READ BACK BY AND VERIFIED WITH: PHARMD PAYTES [id number] @ 1645 fh    Culture (A)  Final    STAPHYLOCOCCUS HOMINIS THE SIGNIFICANCE OF ISOLATING THIS ORGANISM FROM A SINGLE SET OF BLOOD CULTURES WHEN MULTIPLE SETS ARE DRAWN IS UNCERTAIN. PLEASE NOTIFY THE MICROBIOLOGY DEPARTMENT WITHIN ONE WEEK IF SPECIATION AND SENSITIVITIES ARE REQUIRED. Performed at [facility], [address], [place], [place] [address]    Report Status PENDING  Incomplete  Blood Culture ID Panel (Reflexed)     Status: Abnormal   Collection Time: 08/24/23  7:00 PM  Result Value Ref Range Status   Enterococcus faecalis NOT DETECTED NOT DETECTED Final   Enterococcus Faecium NOT DETECTED NOT DETECTED Final   Listeria monocytogenes NOT DETECTED NOT DETECTED Final   Staphylococcus species DETECTED (A) NOT DETECTED Final    Comment: CRITICAL RESULT CALLED TO,  READ BACK BY AND VERIFIED WITH: PHARMD PAYTES [id number] @ 1645 fh    Staphylococcus aureus (BCID) NOT DETECTED NOT DETECTED Final   Staphylococcus epidermidis NOT DETECTED NOT DETECTED Final   Staphylococcus lugdunensis NOT DETECTED NOT DETECTED Final   Streptococcus species NOT DETECTED NOT DETECTED Final   Streptococcus agalactiae NOT DETECTED NOT DETECTED Final   Streptococcus pneumoniae NOT DETECTED NOT DETECTED Final   Streptococcus pyogenes NOT DETECTED NOT DETECTED Final   A.calcoaceticus-baumannii NOT DETECTED NOT DETECTED Final   Bacteroides fragilis NOT DETECTED NOT DETECTED Final   Enterobacterales NOT DETECTED NOT DETECTED Final   Enterobacter cloacae complex NOT DETECTED NOT DETECTED Final   Escherichia coli NOT DETECTED NOT DETECTED Final   Klebsiella aerogenes NOT DETECTED NOT DETECTED Final   Klebsiella oxytoca NOT DETECTED NOT DETECTED Final   Klebsiella pneumoniae NOT DETECTED NOT DETECTED Final   Proteus species NOT DETECTED NOT DETECTED Final   Salmonella species NOT DETECTED NOT DETECTED Final   Serratia marcescens NOT DETECTED NOT DETECTED Final   Haemophilus influenzae NOT DETECTED NOT DETECTED Final   Neisseria meningitidis NOT DETECTED NOT DETECTED Final   Pseudomonas aeruginosa NOT DETECTED NOT DETECTED Final   Stenotrophomonas maltophilia NOT DETECTED NOT DETECTED Final   Candida albicans NOT DETECTED NOT DETECTED Final   Candida auris NOT DETECTED NOT DETECTED Final   Candida glabrata NOT DETECTED NOT DETECTED Final   Candida krusei NOT DETECTED NOT DETECTED Final   Candida parapsilosis NOT DETECTED NOT DETECTED Final   Candida tropicalis NOT DETECTED NOT DETECTED Final   Cryptococcus neoformans/gattii NOT DETECTED NOT DETECTED Final    Comment: Performed at [facility], [address], [place], [place] [address]  Blood  culture (routine x 2)     Status: None (Preliminary result)   Collection Time: 08/24/23  7:15 PM   Specimen: BLOOD  Result Value  Ref Range Status   Specimen Description BLOOD SITE NOT SPECIFIED  Final   Special Requests   Final    BOTTLES DRAWN AEROBIC AND ANAEROBIC Blood Culture results may not be optimal due to an inadequate volume of blood received in culture bottles   Culture  Setup Time   Final    GRAM POSITIVE RODS AEROBIC BOTTLE ONLY CRITICAL RESULT CALLED TO, READ BACK BY AND VERIFIED WITH: PHARMD JENNIE Z. [id number], ADC     Culture   Final    CULTURE REINCUBATED FOR BETTER GROWTH Performed at [facility], [address], [place], [place] [address]    Report Status PENDING  Incomplete  MRSA Next Gen by PCR, Nasal     Status: None   Collection Time: 08/24/23  8:13 PM   Specimen: Nasal Mucosa; Nasal Swab  Result Value Ref Range Status   MRSA by PCR Next Gen NOT DETECTED NOT DETECTED Final    Comment: (NOTE) The GeneXpert MRSA Assay (FDA approved for NASAL specimens only), is one component of a comprehensive MRSA colonization surveillance program. It is not intended to diagnose MRSA infection nor to guide or monitor treatment for MRSA infections. Test performance is not FDA approved in patients less than 70 years old. Performed at [facility], [address] [place], [place] [address]   C Difficile Quick Screen w PCR reflex     Status: None   Collection Time: 08/24/23 10:30 PM   Specimen: STOOL  Result Value Ref Range Status   C Diff antigen NEGATIVE NEGATIVE Final   C Diff toxin NEGATIVE NEGATIVE Final   C Diff interpretation No C. difficile detected.  Final    Comment: Performed at [facility], [address], [place], [place] [address]  Urine Culture     Status: Abnormal   Collection Time: 08/25/23  4:27 AM   Specimen: In/Out Cath Urine  Result Value Ref Range Status   Specimen Description IN/OUT CATH URINE  Final   Special Requests   Final    NONE Performed at [facility], [address] [place], [place] [address]    Culture 2,000 COLONIES/mL YEAST (A)   Final   Report Status 08/26/2023 FINAL  Final     Studies: No results found.     Joycelyn Das, MD  [facility] 08/28/2023  If 7PM-7AM, please contact night-coverage

## 2023-08-28 NOTE — Progress Notes (Signed)
Heart Failure Navigator Progress Note  Assessed for Heart & Vascular TOC clinic readiness.  Patient does not meet criteria due to ESRD on hemodialysis.   Navigator will sign off at this time.   Rhae Hammock, BSN, Scientist, clinical (histocompatibility and immunogenetics) Only

## 2023-08-28 NOTE — Progress Notes (Signed)
[facility] Progress Note   Subjective:   Patient feels well with no complaints today  Objective Vitals:   08/27/23 2109 08/28/23 0400 08/28/23 0445 08/28/23 0801  BP: (!) 154/71  (!) 156/68 (!) 140/65  Pulse: 78  72 86  Resp: 18  19 18   Temp: 98 F (36.7 C)  98.6 F (37 C) 98.4 F (36.9 C)  TempSrc: Oral  Oral Oral  SpO2: 100%  100% 100%  Weight:  56 kg    Height:       Physical Exam  General: Alert male in NAD Heart: Normal rate, no rub Lungs: Bilateral chest rise with no increased work of breathing Abdomen: Soft, non-distended, +BS Extremities: No edema b/l lower extremities, L BKA Dialysis Access:  LUE AVF + t/b  Additional Objective Labs: Basic Metabolic Panel: Recent Labs  Lab 08/26/23 0349 08/27/23 0313 08/28/23 0657  NA 136 136 138  K 4.6 4.3 4.1  CL 99 96* 96*  CO2 22 26 28   GLUCOSE 90 80 90  BUN 46* 25* 41*  CREATININE 3.93* 2.65* 4.13*  CALCIUM 7.7* 8.2* 8.5*  PHOS 6.7* 4.8* 5.1*   Liver Function Tests: Recent Labs  Lab 08/24/23 1527  AST 32  ALT 27  ALKPHOS 104  BILITOT 0.7  PROT 6.8  ALBUMIN 2.7*   Recent Labs  Lab 08/24/23 1527  LIPASE 46   CBC: Recent Labs  Lab 08/24/23 1527 08/25/23 0358 08/26/23 0349 08/27/23 0313 08/28/23 0657  WBC 12.5* 9.4 6.1 6.7 6.8  NEUTROABS 12.0*  --   --   --   --   HGB 8.7* 7.2* 7.6* 7.8* 8.3*  HCT 29.3* 23.5* 24.7* 24.6* 26.9*  MCV 109.3* 106.3* 106.5* 103.4* 105.9*  PLT 187 147* 145* 143* 142*   Blood Culture    Component Value Date/Time   SDES IN/OUT CATH URINE 08/25/2023 0427   SPECREQUEST  08/25/2023 0427    NONE Performed at [facility], [address] [place], [place] [address]    CULT 2,000 COLONIES/mL YEAST (A) 08/25/2023 0427   REPTSTATUS 08/26/2023 FINAL 08/25/2023 0427    Cardiac Enzymes: No results for input(s): "CKTOTAL", "CKMB", "CKMBINDEX", "TROPONINI" in the last 168 hours. CBG: Recent Labs  Lab 08/27/23 1610 08/27/23 2106 08/28/23 0053  08/28/23 0439 08/28/23 0759  GLUCAP 146* 141* 88 94 107*   Iron Studies: No results for input(s): "IRON", "TIBC", "TRANSFERRIN", "FERRITIN" in the last 72 hours. @lablastinr3 @ Studies/Results: No results found.  Medications:  cefTRIAXone (ROCEPHIN)  IV 1 g (08/28/23 0842)    amiodarone  200 mg Oral Daily   apixaban  2.5 mg Oral BID   atorvastatin  20 mg Oral Daily   calcitRIOL  0.25 mcg Oral Q T,Th,Sa-HD   Chlorhexidine Gluconate Cloth  6 each Topical Daily   Chlorhexidine Gluconate Cloth  6 each Topical Q0600   insulin aspart  0-9 Units Subcutaneous Q4H   ondansetron (ZOFRAN) IV  4 mg Intravenous Once   pantoprazole  40 mg Oral BID AC    Dialysis Orders: TTS G-O  3h   2.25 bath  B400  52kg   heparin none  LUA AVF - last HD 1/23, post wt 56.6kg - erratic wt gains, gets to dry wt 50% of the time - rocaltrol 0.25 three times per week - mircera 200 q2, last 1/18, due 2/01  Assessment/Plan: Shock - possible sepsis, PNA. Getting IV abx, initially required levo support for hypotension, now weaned.  Antibiotic per primary team  Acute encephalopathy - seems much of ESRD - on HD TTS.  Continue to maintain dialysis schedule Volume -  CXR without edema, UF with HD as tolerated.   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 Next esa due 2/01. Follow. Transfuse PRN.  Not checking iron because of infection concerns Secondary hyperparathyroidism - CCa in range and phos was initially low, now at goal. Binders on hold for now, follow trend. Cont po vdra three times per week.  Atrial fib - takes eliquis at home HTN - taking low dose BB at home

## 2023-08-29 DIAGNOSIS — A419 Sepsis, unspecified organism: Secondary | ICD-10-CM | POA: Diagnosis not present

## 2023-08-29 DIAGNOSIS — R6521 Severe sepsis with septic shock: Secondary | ICD-10-CM | POA: Diagnosis not present

## 2023-08-29 LAB — CBC
HCT: 24.8 % — ABNORMAL LOW (ref 39.0–52.0)
Hemoglobin: 7.6 g/dL — ABNORMAL LOW (ref 13.0–17.0)
MCH: 32.2 pg (ref 26.0–34.0)
MCHC: 30.6 g/dL (ref 30.0–36.0)
MCV: 105.1 fL — ABNORMAL HIGH (ref 80.0–100.0)
Platelets: 131 10*3/uL — ABNORMAL LOW (ref 150–400)
RBC: 2.36 MIL/uL — ABNORMAL LOW (ref 4.22–5.81)
RDW: 17 % — ABNORMAL HIGH (ref 11.5–15.5)
WBC: 9 10*3/uL (ref 4.0–10.5)
nRBC: 0 % (ref 0.0–0.2)

## 2023-08-29 LAB — GLUCOSE, CAPILLARY
Glucose-Capillary: 106 mg/dL — ABNORMAL HIGH (ref 70–99)
Glucose-Capillary: 117 mg/dL — ABNORMAL HIGH (ref 70–99)
Glucose-Capillary: 123 mg/dL — ABNORMAL HIGH (ref 70–99)
Glucose-Capillary: 132 mg/dL — ABNORMAL HIGH (ref 70–99)

## 2023-08-29 LAB — PHOSPHORUS: Phosphorus: 5.2 mg/dL — ABNORMAL HIGH (ref 2.5–4.6)

## 2023-08-29 LAB — BASIC METABOLIC PANEL
Anion gap: 15 (ref 5–15)
BUN: 57 mg/dL — ABNORMAL HIGH (ref 8–23)
CO2: 24 mmol/L (ref 22–32)
Calcium: 8 mg/dL — ABNORMAL LOW (ref 8.9–10.3)
Chloride: 98 mmol/L (ref 98–111)
Creatinine, Ser: 5.26 mg/dL — ABNORMAL HIGH (ref 0.61–1.24)
GFR, Estimated: 11 mL/min — ABNORMAL LOW (ref >60)
Glucose, Bld: 120 mg/dL — ABNORMAL HIGH (ref 70–99)
Potassium: 4.8 mmol/L (ref 3.5–5.1)
Sodium: 137 mmol/L (ref 135–145)

## 2023-08-29 MED ORDER — GERHARDT'S BUTT CREAM
TOPICAL_CREAM | Freq: Two times a day (BID) | CUTANEOUS | Status: DC
  Filled 2023-08-29: qty 60

## 2023-08-29 MED ORDER — MEDIHONEY WOUND/BURN DRESSING EX PSTE
1.0000 | PASTE | Freq: Every day | CUTANEOUS | Status: DC
  Administered 2023-08-29: 1 via TOPICAL
  Filled 2023-08-29: qty 44

## 2023-08-29 MED ORDER — GERHARDT'S BUTT CREAM: 1.0000 | TOPICAL_CREAM | Freq: Two times a day (BID) | CUTANEOUS | Status: DC

## 2023-08-29 NOTE — TOC Transition Note (Signed)
Transition of Care [facility]) - Discharge Note   Patient Details  Name: [name] MRN: [id number] Date of Birth: 01-07-1956  [facility] CM/SW Contact:  Brunilda Eble A [place], Theresia Majors Phone Number: 08/29/2023, 5:05 PM   Clinical Narrative:     Patient will DC to: [facility]  Anticipated DC date: 08/29/23  Family notified: Tammy  Transport by: Sharin Mons      Per MD patient ready for DC to  [facility]. RN, patient, patient's family, and facility notified of DC. Discharge Summary and FL2 sent to facility. RN to call report prior to discharge [phone]). DC packet on chart. Ambulance transport requested for patient.     CSW will sign off for now as social work intervention is no longer needed. Please consult [place] again if new needs arise.   Final next level of care: Skilled Nursing Facility Barriers to Discharge: Barriers Resolved   Patient Goals and CMS Choice            Discharge Placement              Patient chooses bed at: [facility] Patient to be transferred to facility by: [facility] Name of family member notified: Tammy Patient and family notified of of transfer: 08/29/23  Discharge Plan and Services Additional resources added to the After Visit Summary for                                       Social Drivers of Health (SDOH) Interventions SDOH Screenings   Food Insecurity: Patient Unable To Answer (08/25/2023)  Housing: Patient Unable To Answer (08/25/2023)  Transportation Needs: Patient Unable To Answer (08/25/2023)  Utilities: Patient Unable To Answer (08/25/2023)  Social Connections: Patient Unable To Answer (08/25/2023)  Tobacco Use: Low Risk  (08/24/2023)     Readmission Risk Interventions     No data to display

## 2023-08-29 NOTE — Progress Notes (Addendum)
[facility] Progress Note   Subjective:   Patient eating breakfast with no complaints today.  Objective Vitals:   08/28/23 2038 08/29/23 0429 08/29/23 0500 08/29/23 0735  BP: (!) 152/71 (!) 143/64  (!) 168/71  Pulse: 87 87  89  Resp: 18 18  17   Temp: 98.6 F (37 C) 98.4 F (36.9 C)    TempSrc: Oral     SpO2: 100% 100%  98%  Weight:   58.6 kg   Height:       Physical Exam  General: Alert male in NAD Heart: Normal rate, no rub Lungs: Bilateral chest rise with no increased work of breathing Abdomen: Soft, non-distended, +BS Extremities: No edema b/l lower extremities, L BKA Dialysis Access:  LUE AVF + t/b  Additional Objective Labs: Basic Metabolic Panel: Recent Labs  Lab 08/27/23 0313 08/28/23 0657 08/29/23 0608  NA 136 138 137  K 4.3 4.1 4.8  CL 96* 96* 98  CO2 26 28 24   GLUCOSE 80 90 120*  BUN 25* 41* 57*  CREATININE 2.65* 4.13* 5.26*  CALCIUM 8.2* 8.5* 8.0*  PHOS 4.8* 5.1* 5.2*   Liver Function Tests: Recent Labs  Lab 08/24/23 1527  AST 32  ALT 27  ALKPHOS 104  BILITOT 0.7  PROT 6.8  ALBUMIN 2.7*   Recent Labs  Lab 08/24/23 1527  LIPASE 46   CBC: Recent Labs  Lab 08/24/23 1527 08/25/23 0358 08/26/23 0349 08/27/23 0313 08/28/23 0657 08/29/23 0608  WBC 12.5* 9.4 6.1 6.7 6.8 9.0  NEUTROABS 12.0*  --   --   --   --   --   HGB 8.7* 7.2* 7.6* 7.8* 8.3* 7.6*  HCT 29.3* 23.5* 24.7* 24.6* 26.9* 24.8*  MCV 109.3* 106.3* 106.5* 103.4* 105.9* 105.1*  PLT 187 147* 145* 143* 142* 131*   Blood Culture    Component Value Date/Time   SDES IN/OUT CATH URINE 08/25/2023 0427   SPECREQUEST  08/25/2023 0427    NONE Performed at [facility], [address] [place], [place] [address]    CULT 2,000 COLONIES/mL YEAST (A) 08/25/2023 0427   REPTSTATUS 08/26/2023 FINAL 08/25/2023 0427    Cardiac Enzymes: No results for input(s): "CKTOTAL", "CKMB", "CKMBINDEX", "TROPONINI" in the last 168 hours. CBG: Recent Labs  Lab  08/28/23 1645 08/28/23 1948 08/29/23 0007 08/29/23 0427 08/29/23 0736  GLUCAP 125* 133* 106* 117* 123*   Iron Studies: No results for input(s): "IRON", "TIBC", "TRANSFERRIN", "FERRITIN" in the last 72 hours. @lablastinr3 @ Studies/Results: No results found.  Medications:  cefTRIAXone (ROCEPHIN)  IV 1 g (08/28/23 0842)    amiodarone  200 mg Oral Daily   apixaban  2.5 mg Oral BID   atorvastatin  20 mg Oral Daily   calcitRIOL  0.25 mcg Oral Q T,Th,Sa-HD   Chlorhexidine Gluconate Cloth  6 each Topical Daily   Chlorhexidine Gluconate Cloth  6 each Topical Q0600   Gerhardt's butt cream   Topical BID   insulin aspart  0-9 Units Subcutaneous Q4H   leptospermum manuka honey  1 Application Topical Daily   ondansetron (ZOFRAN) IV  4 mg Intravenous Once   pantoprazole  40 mg Oral BID AC    Dialysis Orders: TTS G-O  3h   2.25 bath  B400  52kg   heparin none  LUA AVF - last HD 1/23, post wt 56.6kg - erratic wt gains, gets to dry wt 50% of the time - rocaltrol 0.25 three times per week - mircera 200 q2, last  1/18, due 2/01  Assessment/Plan: Shock - possible sepsis, PNA. Getting IV abx, initially required levo support for hypotension, now weaned.  Antibiotic per primary team Acute encephalopathy -resolved ESRD - on HD TTS.  Continue to maintain dialysis schedule Volume -acceptable.  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 Next esa due 2/01. Follow. Transfuse PRN.  Not checking iron because of infection concerns Secondary hyperparathyroidism - CCa in range and phos was initially low, now at goal. Binders on hold for now, follow trend. Cont po vdra three times per week.  Atrial fib - takes eliquis at home HTN - taking low dose BB at home. Can restart at DC

## 2023-08-29 NOTE — Discharge Summary (Signed)
Physician Discharge Summary  [name] HYQ:[id number] DOB: [birth year] DOA: 08/24/2023  PCP: Patient, No Pcp Per  Admit date: 08/24/2023 Discharge date: 08/29/2023  Admitted From: Skilled nursing facility  Discharge disposition: Skilled nursing facility   Recommendations for Outpatient Follow-Up:   Follow up with your primary care provider at the skilled nursing facility in 3 to 5 days Check CBC, BMP, magnesium in the next visit Patient has been requiring some supplemental oxygen.  Wean as able.  Discharge Diagnosis:   Principal Problem:   Septic shock (HCC) Active Problems:   Encephalopathy acute   Lactic acidosis   Pressure injury of ankle, stage 2 (HCC)  Discharge Condition: Improved.  Diet recommendation: Low sodium, heart healthy.    Wound care: None.  Code status: Full.   History of Present Illness:   68 year old male with past medical history of end-stage renal disease on Tuesday, Thursday, Saturday, diabetes mellitus type 2, GI bleed, atrial fibrillation on Eliquis, s/p R toe amputations and L BKA currently at a skilled nursing facility presented to the hospital with vomiting multiple episodes with some watery diarrhea. In the ED, patient was noted to be febrile with temperature of 100.4 F with mild leukocytosis and soft blood pressure. He was empirically treated with IV antibiotics and vasopressors.  Initially patient was treated for hypotension in the ICU but subsequently got better so was transferred out of the ICU to medical service.   Hospital Course:   Following conditions were addressed during hospitalization as listed below,  Undifferentiated shock likely related to hypovolemia, sepsis.  Chest x-ray showed some left lower lobe consolidation likely secondary to pneumonia.  Urinalysis with leukocytes but no other symptoms. COVID influenza and RSV was negative.  C. difficile was negative.  Lactic acid down trended.  Procalcitonin was elevated to 6.4 on  presentation.  Has completed course of Rocephin for pneumonia.  Blood cultures showed Staphylococcus hominis likely contaminant.  Urine culture with colonies of yeast.  C. difficile  was negative.   Was initially on Levophed which was discontinued and was started on midodrine.  This has been discontinued at this time due to improved blood pressure.  Clinically improved without any fever or leukocytosis.    Hepatitis B surface antigen none reactive.     Sepsis secondary to possible left lower lobe pneumonia.  Sepsis ruled in.  Patient had fever with leukocytosis and hypotension requiring vasopressors from pulmonary infiltrate secondary to pneumonia.  Completed course of antibiotic including Rocephin IV at this time.  On supplemental oxygen, currently on 3 L of oxygen by nasal cannula.  Recommend weaning off oxygen as able after discharge.   Acute metabolic encephalopathy, improved Likely secondary to sepsis, hypovolemia continue delirium precautions.  Improved at this time.  Afib on eliquis Hypertension Chronic systolic congestive heart failure.   2D echocardiogram 08/25/2023 showed LV ejection fraction of 40 to 45% with global hypokinesis. BNP elevated to 809.  Continue amiodarone and Eliquis.  Wean supplemental oxygen as outpatient.   ESRD on HD TThS Seen by nephrology during hospitalization for hemodialysis.   Hx of GIB Anemia of chronic disease Continue PPI.  Latest hemoglobin of 7.6.  No external blood loss reported.  Pressure injury stage I to the coccyx and stage II to the right heel present on admission.  Continue wound care. Xray of the right heel with no evidence of osteomyelitis, continue local wound care.  Disposition.  At this time, patient is stable for disposition to skilled nursing facility   Medical  Consultants:   PCCM Nephrology  Procedures:    Hemodialysis Subjective:   Today, patient seen and examined at bedside.  Feels okay.  Denies any pain, nausea, vomiting,  fever, chills or rigor.  Still on some supplemental oxygen.  Wishes to have dialysis prior to discharge.  Discharge Exam:   Vitals:   08/29/23 0429 08/29/23 0735  BP: (!) 143/64 (!) 168/71  Pulse: 87 89  Resp: 18 17  Temp: 98.4 F (36.9 C)   SpO2: 100% 98%   Vitals:   08/28/23 2038 08/29/23 0429 08/29/23 0500 08/29/23 0735  BP: (!) 152/71 (!) 143/64  (!) 168/71  Pulse: 87 87  89  Resp: 18 18  17   Temp: 98.6 F (37 C) 98.4 F (36.9 C)    TempSrc: Oral     SpO2: 100% 100%  98%  Weight:   58.6 kg   Height:        General: Alert awake, not in obvious distress, on nasal cannula oxygen HENT: pupils equally reacting to light,  No scleral pallor or icterus noted. Oral mucosa is moist.  Chest:    Diminished breath sounds bilaterally.  Coarse breath sounds noted. CVS: S1 &S2 heard. No murmur.  Regular rate and rhythm. Abdomen: Soft, nontender, nondistended.  Bowel sounds are heard.   Extremities: Left below-knee amputation. Left upper extremity AV fistula in place.  Psych: Alert, awake and oriented, normal mood CNS:  No cranial nerve deficits.  Power equal in all extremities.   Skin: Warm and dry.  Pressure injury to the coccyx and right heel.  The results of significant diagnostics from this hospitalization (including imaging, microbiology, ancillary and laboratory) are listed below for reference.     Diagnostic Studies:   ECHOCARDIOGRAM LIMITED Result Date: 08/25/2023    ECHOCARDIOGRAM LIMITED REPORT   Patient Name:   [name] Date of Exam: 08/25/2023 Medical Rec #:  [id number]    Height:       63.0 in Accession #:    [id number]   Weight:       125.2 lb Date of Birth:  1956-01-29   BSA:          1.585 m Patient Age:    67 years     BP:           104/54 mmHg Patient Gender: M            HR:           93 bpm. Exam Location:  Inpatient Procedure: Limited Echo and Limited Color Doppler Indications:    Shock R57.9  History:        Patient has prior history of Echocardiogram  examinations, most                 recent 11/18/2022. CHF and Cardiomyopathy, Previous Myocardial                 Infarction, PAD and ESRD, Arrythmias:Atrial Fibrillation,                 Signs/Symptoms:Hypotension; Risk Factors:Hypertension and                 Diabetes.  Sonographer:    Lucendia Herrlich RCS Referring Phys: [id number] GRACE E BOWSER  Sonographer Comments: Image acquisition challenging due to uncooperative patient. IMPRESSIONS  1. Left ventricular ejection fraction, by estimation, is 40 to 45%. The left ventricle has mildly decreased function. The left ventricle demonstrates global hypokinesis.  2. Right ventricular systolic function is normal.  The right ventricular size is normal.  3. The mitral valve is normal in structure. Trivial mitral valve regurgitation. No evidence of mitral stenosis.  4. The aortic valve is tricuspid. Aortic valve regurgitation is trivial. FINDINGS  Left Ventricle: Left ventricular ejection fraction, by estimation, is 40 to 45%. The left ventricle has mildly decreased function. The left ventricle demonstrates global hypokinesis. The left ventricular internal cavity size was normal in size. There is  no left ventricular hypertrophy. Right Ventricle: The right ventricular size is normal. No increase in right ventricular wall thickness. Right ventricular systolic function is normal. Mitral Valve: The mitral valve is normal in structure. Trivial mitral valve regurgitation. No evidence of mitral valve stenosis. Aortic Valve: The aortic valve is tricuspid. Aortic valve regurgitation is trivial. Aorta: The aortic root is normal in size and structure. Venous: The inferior vena cava was not well visualized. LEFT VENTRICLE PLAX 2D LVIDd:         4.10 cm LVIDs:         3.70 cm LV PW:         1.00 cm LV IVS:        0.70 cm LVOT diam:     1.80 cm LVOT Area:     2.54 cm  LEFT ATRIUM         Index LA diam:    4.30 cm 2.71 cm/m   AORTA Ao Root diam: 3.10 cm Ao Asc diam:  3.10 cm  SHUNTS  Systemic Diam: 1.80 cm [name] MD Electronically signed by [name] MD Signature Date/Time: 08/25/2023/3:27:08 PM    Final    DG Foot 2 Views Right Result Date: 08/24/2023 CLINICAL DATA:  Sepsis EXAM: RIGHT FOOT - 2 VIEW COMPARISON:  Foot radiographs 08/16/2021 FINDINGS: Amputation of the 1st-2nd toes at the MTP joint. Amputation of the fifth toe at the mid metatarsal. 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 No definite osteomyelitis or acute fracture. Calcaneal spurs. Mild diffuse soft tissue swelling. IMPRESSION: No definite osteomyelitis. Electronically Signed   By: Minerva Fester M.D.   On: 08/24/2023 23:10   DG Chest Portable 1 View Result Date: 08/24/2023 CLINICAL DATA:  Hypoxia. Nausea, vomiting and chills during dialysis. EXAM: PORTABLE CHEST 1 VIEW COMPARISON:  09/29/2022 FINDINGS: The heart is within normal limits in size given AP projection and portable technique. The mediastinal and hilar contours are within normal limits. Low lung volumes with streaky bibasilar atelectasis but no infiltrates, edema effusions. The bony thorax is intact. IMPRESSION: Low lung volumes with streaky bibasilar atelectasis. Electronically Signed   By: Rudie Meyer M.D.   On: 08/24/2023 17:43     Labs:   Basic Metabolic Panel: Recent Labs  Lab 08/24/23 2314 08/25/23 0358 08/26/23 0349 08/27/23 0313 08/28/23 0657 08/29/23 0608  NA  --  136 136 136 138 137  K  --  4.2 4.6 4.3 4.1 4.8  CL  --  95* 99 96* 96* 98  CO2  --  25 22 26 28 24   GLUCOSE  --  88 90 80 90 120*  BUN  --  35* 46* 25* 41* 57*  CREATININE  --  2.81* 3.93* 2.65* 4.13* 5.26*  CALCIUM  --  8.1* 7.7* 8.2* 8.5* 8.0*  MG 1.6*  --   --  2.0  --   --   PHOS 1.8*  --  6.7* 4.8* 5.1* 5.2*   GFR Estimated Creatinine Clearance: 11.3 mL/min (A) (by C-G formula based on SCr of 5.26 mg/dL (  H)). Liver Function Tests: Recent Labs  Lab 08/24/23 1527  AST 32  ALT 27  ALKPHOS 104  BILITOT 0.7   PROT 6.8  ALBUMIN 2.7*   Recent Labs  Lab 08/24/23 1527  LIPASE 46   No results for input(s): "AMMONIA" in the last 168 hours. Coagulation profile No results for input(s): "INR", "PROTIME" in the last 168 hours.  CBC: Recent Labs  Lab 08/24/23 1527 08/25/23 0358 08/26/23 0349 08/27/23 0313 08/28/23 0657 08/29/23 0608  WBC 12.5* 9.4 6.1 6.7 6.8 9.0  NEUTROABS 12.0*  --   --   --   --   --   HGB 8.7* 7.2* 7.6* 7.8* 8.3* 7.6*  HCT 29.3* 23.5* 24.7* 24.6* 26.9* 24.8*  MCV 109.3* 106.3* 106.5* 103.4* 105.9* 105.1*  PLT 187 147* 145* 143* 142* 131*   Cardiac Enzymes: No results for input(s): "CKTOTAL", "CKMB", "CKMBINDEX", "TROPONINI" in the last 168 hours. BNP: Invalid input(s): "POCBNP" CBG: Recent Labs  Lab 08/28/23 1948 08/29/23 0007 08/29/23 0427 08/29/23 0736 08/29/23 1319  GLUCAP 133* 106* 117* 123* 132*   D-Dimer No results for input(s): "DDIMER" in the last 72 hours. Hgb A1c No results for input(s): "HGBA1C" in the last 72 hours. Lipid Profile No results for input(s): "CHOL", "HDL", "LDLCALC", "TRIG", "CHOLHDL", "LDLDIRECT" in the last 72 hours. Thyroid function studies No results for input(s): "TSH", "T4TOTAL", "T3FREE", "THYROIDAB" in the last 72 hours.  Invalid input(s): "FREET3" Anemia work up No results for input(s): "VITAMINB12", "FOLATE", "FERRITIN", "TIBC", "IRON", "RETICCTPCT" in the last 72 hours. Microbiology Recent Results (from the past 240 hours)  Resp panel by RT-PCR (RSV, Flu A&B, Covid) Anterior Nasal Swab     Status: None   Collection Time: 08/24/23  4:44 PM   Specimen: Anterior Nasal Swab  Result Value Ref Range Status   SARS Coronavirus 2 by RT PCR NEGATIVE NEGATIVE Final   Influenza A by PCR NEGATIVE NEGATIVE Final   Influenza B by PCR NEGATIVE NEGATIVE Final    Comment: (NOTE) The Xpert Xpress SARS-CoV-2/FLU/RSV plus assay is intended as an aid in the diagnosis of influenza from Nasopharyngeal swab specimens and should not  be used as a sole basis for treatment. Nasal washings and aspirates are unacceptable for Xpert Xpress SARS-CoV-2/FLU/RSV testing.  Fact Sheet for Patients: BloggerCourse.com  Fact Sheet for Healthcare Providers: SeriousBroker.it  This test is not yet approved or cleared by the [place] FDA and has been authorized for detection and/or diagnosis of SARS-CoV-2 by FDA under an Emergency Use Authorization (EUA). This EUA will remain in effect (meaning this test can be used) for the duration of the COVID-19 declaration under Section 564(b)(1) of the Act, 21 U.S.C. section 360bbb-3(b)(1), unless the authorization is terminated or revoked.     Resp Syncytial Virus by PCR NEGATIVE NEGATIVE Final    Comment: (NOTE) Fact Sheet for Patients: BloggerCourse.com  Fact Sheet for Healthcare Providers: SeriousBroker.it  This test is not yet approved or cleared by the [place] FDA and has been authorized for detection and/or diagnosis of SARS-CoV-2 by FDA under an Emergency Use Authorization (EUA). This EUA will remain in effect (meaning this test can be used) for the duration of the COVID-19 declaration under Section 564(b)(1) of the Act, 21 U.S.C. section 360bbb-3(b)(1), unless the authorization is terminated or revoked.  Performed at [facility], [address] [place], [place] [address]   Blood culture (routine x 2)     Status: Abnormal   Collection Time: 08/24/23  7:00 PM  Specimen: BLOOD  Result Value Ref Range Status   Specimen Description BLOOD SITE NOT SPECIFIED  Final   Special Requests   Final    BOTTLES DRAWN AEROBIC AND ANAEROBIC Blood Culture results may not be optimal due to an inadequate volume of blood received in culture bottles   Culture  Setup Time   Final    GRAM POSITIVE COCCI IN CLUSTERS IN BOTH AEROBIC AND ANAEROBIC BOTTLES CRITICAL RESULT CALLED  TO, READ BACK BY AND VERIFIED WITH: PHARMD PAYTES [id number] @ 1645 fh    Culture (A)  Final    STAPHYLOCOCCUS HOMINIS STAPHYLOCOCCUS EPIDERMIDIS THE SIGNIFICANCE OF ISOLATING THIS ORGANISM FROM A SINGLE SET OF BLOOD CULTURES WHEN MULTIPLE SETS ARE DRAWN IS UNCERTAIN. PLEASE NOTIFY THE MICROBIOLOGY DEPARTMENT WITHIN ONE WEEK IF SPECIATION AND SENSITIVITIES ARE REQUIRED. Performed at [facility], [address] [place], [place] [address]    Report Status 08/28/2023 FINAL  Final  Blood Culture ID Panel (Reflexed)     Status: Abnormal   Collection Time: 08/24/23  7:00 PM  Result Value Ref Range Status   Enterococcus faecalis NOT DETECTED NOT DETECTED Final   Enterococcus Faecium NOT DETECTED NOT DETECTED Final   Listeria monocytogenes NOT DETECTED NOT DETECTED Final   Staphylococcus species DETECTED (A) NOT DETECTED Final    Comment: CRITICAL RESULT CALLED TO, READ BACK BY AND VERIFIED WITH: PHARMD PAYTES [id number] @ 1645 fh    Staphylococcus aureus (BCID) NOT DETECTED NOT DETECTED Final   Staphylococcus epidermidis NOT DETECTED NOT DETECTED Final   Staphylococcus lugdunensis NOT DETECTED NOT DETECTED Final   Streptococcus species NOT DETECTED NOT DETECTED Final   Streptococcus agalactiae NOT DETECTED NOT DETECTED Final   Streptococcus pneumoniae NOT DETECTED NOT DETECTED Final   Streptococcus pyogenes NOT DETECTED NOT DETECTED Final   A.calcoaceticus-baumannii NOT DETECTED NOT DETECTED Final   Bacteroides fragilis NOT DETECTED NOT DETECTED Final   Enterobacterales NOT DETECTED NOT DETECTED Final   Enterobacter cloacae complex NOT DETECTED NOT DETECTED Final   Escherichia coli NOT DETECTED NOT DETECTED Final   Klebsiella aerogenes NOT DETECTED NOT DETECTED Final   Klebsiella oxytoca NOT DETECTED NOT DETECTED Final   Klebsiella pneumoniae NOT DETECTED NOT DETECTED Final   Proteus species NOT DETECTED NOT DETECTED Final   Salmonella species NOT DETECTED NOT DETECTED Final   Serratia  marcescens NOT DETECTED NOT DETECTED Final   Haemophilus influenzae NOT DETECTED NOT DETECTED Final   Neisseria meningitidis NOT DETECTED NOT DETECTED Final   Pseudomonas aeruginosa NOT DETECTED NOT DETECTED Final   Stenotrophomonas maltophilia NOT DETECTED NOT DETECTED Final   Candida albicans NOT DETECTED NOT DETECTED Final   Candida auris NOT DETECTED NOT DETECTED Final   Candida glabrata NOT DETECTED NOT DETECTED Final   Candida krusei NOT DETECTED NOT DETECTED Final   Candida parapsilosis NOT DETECTED NOT DETECTED Final   Candida tropicalis NOT DETECTED NOT DETECTED Final   Cryptococcus neoformans/gattii NOT DETECTED NOT DETECTED Final    Comment: Performed at [facility], [address], [place], [place] [address]  Blood culture (routine x 2)     Status: Abnormal   Collection Time: 08/24/23  7:15 PM   Specimen: BLOOD  Result Value Ref Range Status   Specimen Description BLOOD SITE NOT SPECIFIED  Final   Special Requests   Final    BOTTLES DRAWN AEROBIC AND ANAEROBIC Blood Culture results may not be optimal due to an inadequate volume of blood received in culture bottles   Culture  Setup Time   Final    GRAM POSITIVE RODS AEROBIC BOTTLE ONLY CRITICAL RESULT CALLED TO, READ BACK BY AND VERIFIED WITH: PHARMD JENNIE Z. [id number], ADC     Culture (A)  Final    DIPHTHEROIDS(CORYNEBACTERIUM SPECIES) Standardized susceptibility testing for this organism is not available. Performed at [facility], [address] [place], [place] [address]    Report Status 08/28/2023 FINAL  Final  MRSA Next Gen by PCR, Nasal     Status: None   Collection Time: 08/24/23  8:13 PM   Specimen: Nasal Mucosa; Nasal Swab  Result Value Ref Range Status   MRSA by PCR Next Gen NOT DETECTED NOT DETECTED Final    Comment: (NOTE) The GeneXpert MRSA Assay (FDA approved for NASAL specimens only), is one component of a comprehensive MRSA colonization surveillance program. It is not intended to  diagnose MRSA infection nor to guide or monitor treatment for MRSA infections. Test performance is not FDA approved in patients less than 63 years old. Performed at [facility], [address], [place], [place] [address]   C Difficile Quick Screen w PCR reflex     Status: None   Collection Time: 08/24/23 10:30 PM   Specimen: STOOL  Result Value Ref Range Status   C Diff antigen NEGATIVE NEGATIVE Final   C Diff toxin NEGATIVE NEGATIVE Final   C Diff interpretation No C. difficile detected.  Final    Comment: Performed at [facility], [address] [place], [place] [address]  Urine Culture     Status: Abnormal   Collection Time: 08/25/23  4:27 AM   Specimen: In/Out Cath Urine  Result Value Ref Range Status   Specimen Description IN/OUT CATH URINE  Final   Special Requests   Final    NONE Performed at [facility], [address], [place], [place] [address]    Culture 2,000 COLONIES/mL YEAST (A)  Final   Report Status 08/26/2023 FINAL  Final     Discharge Instructions:   Discharge Instructions     Call MD for:  persistant nausea and vomiting   Complete by: As directed    Call MD for:  severe uncontrolled pain   Complete by: As directed    Call MD for:  temperature >100.4   Complete by: As directed    Diet - low sodium heart healthy   Complete by: As directed    Discharge instructions   Complete by: As directed    Follow-up with your primary care provider at the skilled nursing facility in 3 to 5 days.  Check blood work at that time.  Seek medical attention for worsening symptoms.   Discharge wound care:   Complete by: As directed    Foam dressing to right heel, change Q 3 days or PRN soiling   Increase activity slowly   Complete by: As directed       Allergies as of 08/29/2023       Reactions   Tramadol Nausea And Vomiting   Pt states he took this on an empty stomach one time and it came right back up. He's not had it since.          Medication List     TAKE these medications    amiodarone 200 MG tablet Commonly known as: PACERONE Take 1 tablet (200 mg total) by mouth daily.   apixaban 2.5 MG Tabs tablet Commonly known as: ELIQUIS Take 1 tablet (2.5  mg total) by mouth 2 (two) times daily.   atorvastatin 20 MG tablet Commonly known as: LIPITOR Take 1 tablet (20 mg total) by mouth daily.   Auryxia 1 GM 210 MG(Fe) tablet Generic drug: ferric citrate Take 210 mg by mouth.   bethanechol 10 MG tablet Commonly known as: URECHOLINE Take 5 mg by mouth 3 (three) times daily.   calcitRIOL 0.25 MCG capsule Commonly known as: ROCALTROL Take 1 capsule (0.25 mcg total) by mouth Every Tuesday,Thursday,and Saturday with dialysis.   ferrous sulfate 325 (65 FE) MG tablet Take 325 mg by mouth daily with breakfast.   Gerhardt's butt cream Crea Apply 1 Application topically 2 (two) times daily. Apply to skin of sacrum/buttocks 2 times daily and prn soiling.   metoprolol succinate 25 MG 24 hr tablet Commonly known as: TOPROL-XL Take 12.5 mg by mouth 3 (three) times a week.   pantoprazole 40 MG tablet Commonly known as: PROTONIX Take 1 tablet (40 mg total) by mouth 2 (two) times daily before a meal.   PROMETHAZINE HCL IM Inject 25 mg into the muscle every 4 (four) hours as needed (Nausea and Vomiting).   tamsulosin 0.4 MG Caps capsule Commonly known as: FLOMAX Take 0.4 mg by mouth See admin instructions. Take one tablet by mouth 3 times a week on Monday, Wednesday and Fridays               Discharge Care Instructions  (From admission, onward)           Start     Ordered   08/29/23 0000  Discharge wound care:       Comments: Foam dressing to right heel, change Q 3 days or PRN soiling   08/29/23 1314              Time coordinating discharge: 39 minutes  Signed:  Dewanna Hurston  [facility] 08/29/2023, 1:25 PM

## 2023-08-29 NOTE — Progress Notes (Signed)
Pt came back from dialysis alert x4, V/S stable and no complains. Pt have a order for discharge and writer call [facility] to tranfers pt back to facility.

## 2023-08-29 NOTE — Consult Note (Signed)
WOC Nurse Consult Note: this consult performed remotely utilizing EMR including photo documentation  Reason for Consult: R heel, sacrum, posterior thigh wounds  Wound type: 1.  R heel Stage 2  PI  2.  Sacrum Stage 3 Pressure Injury 3.  L ischium Stage 2 Pressure Injury  Pressure Injury POA: Yes Measurement: see nursing flowsheet  Wound bed: 1. R heel pink and dry  2.  L ischium Stage 2 100% pink and moist  3.  Sacrum Stage 3 PI 50% pink and dry 50% scattered areas of brown necrotic tissue  Drainage (amount, consistency, odor) see nursing flowsheet  Periwound: peeling dry callused skin to heel, peeling skin to sacrum  Dressing procedure/placement/frequency:  Cleanse R heel and L ischium with soap and water, dry and apply silicone foam, lift daily to assess area.  Change foam q3 days and prn soiling.   Cleanse sacral wound with NS, apply Medihoney to brown necrotic tissue.  Coat surrounding skin with Gerhardt's Butt Cream 2 times daily and prn soiling.   POC discussed with bedside nurse. WOC team will not follow. Re-consult if further needs arise.   Thank you,    Priscella Mann MSN, RN-BC, [employer] [phone]

## 2023-08-29 NOTE — Progress Notes (Signed)
   08/29/23 1901  Vitals  Temp 98 F (36.7 C)  Pulse Rate 90  Resp (!) 30  BP 131/61  SpO2 100 %  O2 Device Nasal Cannula  Weight 63.6 kg  Type of Weight Post-Dialysis  Oxygen Therapy  O2 Flow Rate (L/min) 3 L/min  Patient Activity (if Appropriate) In bed  Pulse Oximetry Type Continuous  Oximetry Probe Site Changed No  Post Treatment  Dialyzer Clearance Lightly streaked  Hemodialysis Intake (mL) 0 mL  Liters Processed 78  Fluid Removed (mL) 2000 mL  Tolerated HD Treatment Yes  AVG/AVF Arterial Site Held (minutes) 5 minutes  AVG/AVF Venous Site Held (minutes) 5 minutes   Received patient in bed to unit.  Alert and oriented.  Informed consent signed and in chart.   TX duration: 3 hours 15 minutes  Patient tolerated well.  Transported back to the room  Alert, without acute distress.  Hand-off given to patient's nurse.   Access used: Left AVF Access issues: None  Total UF removed: 2L Medication(s) given: None

## 2023-08-29 NOTE — Plan of Care (Signed)
  Problem: Education: Goal: Ability to describe self-care measures that may prevent or decrease complications (Diabetes Survival Skills Education) will improve Outcome: Progressing Goal: Individualized Educational Video(s) Outcome: Progressing   Problem: Education: Goal: Ability to describe self-care measures that may prevent or decrease complications (Diabetes Survival Skills Education) will improve Outcome: Progressing Goal: Individualized Educational Video(s) Outcome: Progressing   Problem: Coping: Goal: Ability to adjust to condition or change in health will improve Outcome: Progressing   Problem: Fluid Volume: Goal: Ability to maintain a balanced intake and output will improve Outcome: Progressing   Problem: Health Behavior/Discharge Planning: Goal: Ability to identify and utilize available resources and services will improve Outcome: Progressing Goal: Ability to manage health-related needs will improve Outcome: Progressing   Problem: Metabolic: Goal: Ability to maintain appropriate glucose levels will improve Outcome: Progressing   Problem: Nutritional: Goal: Maintenance of adequate nutrition will improve Outcome: Progressing Goal: Progress toward achieving an optimal weight will improve Outcome: Progressing   Problem: Skin Integrity: Goal: Risk for impaired skin integrity will decrease Outcome: Progressing   Problem: Education: Goal: Knowledge of General Education information will improve Description: Including pain rating scale, medication(s)/side effects and non-pharmacologic comfort measures Outcome: Progressing   Problem: Health Behavior/Discharge Planning: Goal: Ability to manage health-related needs will improve Outcome: Progressing   Problem: Clinical Measurements: Goal: Ability to maintain clinical measurements within normal limits will improve Outcome: Progressing Goal: Will remain free from infection Outcome: Progressing Goal: Diagnostic test  results will improve Outcome: Progressing Goal: Respiratory complications will improve Outcome: Progressing Goal: Cardiovascular complication will be avoided Outcome: Progressing   Problem: Activity: Goal: Risk for activity intolerance will decrease Outcome: Progressing   Problem: Elimination: Goal: Will not experience complications related to bowel motility Outcome: Progressing Goal: Will not experience complications related to urinary retention Outcome: Progressing   Problem: Safety: Goal: Ability to remain free from injury will improve Outcome: Progressing   Problem: Skin Integrity: Goal: Risk for impaired skin integrity will decrease Outcome: Progressing

## 2023-08-29 NOTE — Progress Notes (Signed)
D/C order noted. Contacted Berenice Primas to be advised of pt's d/c today and that pt should resume care on Thursday.   Olivia Canter Renal Navigator [phone]

## 2023-08-30 NOTE — Progress Notes (Signed)
[facility] Patient Discharge Orders- [facility] CLINIC: GO  Patient's name: Early Steel Admit/DC Dates: 08/24/2023 - 08/29/2023  Discharge Diagnoses: Undifferentiated shock likely related to hypovolemia, sepsis.   Sepsis secondary to possible left lower lobe pneumonia.   Acute metabolic encephalopathy, improved   Aranesp: Given: no   Date and amount of last dose: o  Last Hgb: 7.6 PRBC's Given: no Date/# of units: o ESA dose for discharge: mircera 200 mcg IV q 2 weeks  next on 09/02/23  IV Iron dose at discharge: no   Heparin change: no  EDW Change: erratic wts  so  no changes for now  New EDW:   [place]ath Change: no  Access intervention/Change: no Details:  Hectorol/Calcitriol change: no  Discharge Labs: Calcium 8.0 Phosphorus 5.2 Albumin not recorded  K+ 4.1  IV Antibiotics: no finished iv ABx in hosp.  Details:  On Coumadin?: no Last INR: Next INR: Managed By:   OTHER/APPTS/LAB ORDERS:    D/C Meds to be reconciled by nurse after every discharge.  Completed By:   Reviewed by: MD:______ RN_______

## 2024-03-22 ENCOUNTER — Other Ambulatory Visit: Payer: Self-pay

## 2024-03-22 ENCOUNTER — Encounter (HOSPITAL_COMMUNITY): Payer: Self-pay

## 2024-03-22 ENCOUNTER — Inpatient Hospital Stay (HOSPITAL_COMMUNITY)
Admission: EM | Admit: 2024-03-22 | Discharge: 2024-03-25 | DRG: 177 | Disposition: A | Discharge status: Skilled Nursing Facility | Source: Other Acute Inpatient Hospital | Attending: Internal Medicine | Admitting: Internal Medicine

## 2024-03-22 ENCOUNTER — Inpatient Hospital Stay (HOSPITAL_COMMUNITY)

## 2024-03-22 ENCOUNTER — Emergency Department (HOSPITAL_COMMUNITY)

## 2024-03-22 DIAGNOSIS — A419 Sepsis, unspecified organism: Secondary | ICD-10-CM

## 2024-03-22 DIAGNOSIS — N186 End stage renal disease: Secondary | ICD-10-CM | POA: Diagnosis present

## 2024-03-22 DIAGNOSIS — E785 Hyperlipidemia, unspecified: Secondary | ICD-10-CM | POA: Diagnosis present

## 2024-03-22 DIAGNOSIS — I4891 Unspecified atrial fibrillation: Secondary | ICD-10-CM | POA: Diagnosis present

## 2024-03-22 DIAGNOSIS — L89502 Pressure ulcer of unspecified ankle, stage 2: Secondary | ICD-10-CM | POA: Diagnosis not present

## 2024-03-22 DIAGNOSIS — Z89421 Acquired absence of other right toe(s): Secondary | ICD-10-CM

## 2024-03-22 DIAGNOSIS — Z681 Body mass index (BMI) 19 or less, adult: Secondary | ICD-10-CM | POA: Diagnosis not present

## 2024-03-22 DIAGNOSIS — R54 Age-related physical debility: Secondary | ICD-10-CM | POA: Diagnosis present

## 2024-03-22 DIAGNOSIS — L97519 Non-pressure chronic ulcer of other part of right foot with unspecified severity: Secondary | ICD-10-CM | POA: Diagnosis present

## 2024-03-22 DIAGNOSIS — E1151 Type 2 diabetes mellitus with diabetic peripheral angiopathy without gangrene: Secondary | ICD-10-CM | POA: Diagnosis present

## 2024-03-22 DIAGNOSIS — U071 COVID-19: Principal | ICD-10-CM | POA: Diagnosis present

## 2024-03-22 DIAGNOSIS — Z8739 Personal history of other diseases of the musculoskeletal system and connective tissue: Secondary | ICD-10-CM | POA: Diagnosis not present

## 2024-03-22 DIAGNOSIS — E43 Unspecified severe protein-calorie malnutrition: Secondary | ICD-10-CM | POA: Diagnosis present

## 2024-03-22 DIAGNOSIS — D631 Anemia in chronic kidney disease: Secondary | ICD-10-CM | POA: Diagnosis present

## 2024-03-22 DIAGNOSIS — L89612 Pressure ulcer of right heel, stage 2: Secondary | ICD-10-CM | POA: Diagnosis present

## 2024-03-22 DIAGNOSIS — Z992 Dependence on renal dialysis: Secondary | ICD-10-CM | POA: Diagnosis not present

## 2024-03-22 DIAGNOSIS — M86671 Other chronic osteomyelitis, right ankle and foot: Secondary | ICD-10-CM | POA: Diagnosis not present

## 2024-03-22 DIAGNOSIS — M869 Osteomyelitis, unspecified: Secondary | ICD-10-CM | POA: Diagnosis present

## 2024-03-22 DIAGNOSIS — Z8616 Personal history of COVID-19: Secondary | ICD-10-CM

## 2024-03-22 DIAGNOSIS — Z833 Family history of diabetes mellitus: Secondary | ICD-10-CM

## 2024-03-22 DIAGNOSIS — Z885 Allergy status to narcotic agent status: Secondary | ICD-10-CM

## 2024-03-22 DIAGNOSIS — Z8249 Family history of ischemic heart disease and other diseases of the circulatory system: Secondary | ICD-10-CM

## 2024-03-22 DIAGNOSIS — Z8701 Personal history of pneumonia (recurrent): Secondary | ICD-10-CM | POA: Diagnosis not present

## 2024-03-22 DIAGNOSIS — Z79899 Other long term (current) drug therapy: Secondary | ICD-10-CM

## 2024-03-22 DIAGNOSIS — L89619 Pressure ulcer of right heel, unspecified stage: Secondary | ICD-10-CM | POA: Diagnosis not present

## 2024-03-22 DIAGNOSIS — Z89512 Acquired absence of left leg below knee: Secondary | ICD-10-CM

## 2024-03-22 DIAGNOSIS — I5042 Chronic combined systolic (congestive) and diastolic (congestive) heart failure: Secondary | ICD-10-CM | POA: Diagnosis present

## 2024-03-22 DIAGNOSIS — Z9049 Acquired absence of other specified parts of digestive tract: Secondary | ICD-10-CM

## 2024-03-22 DIAGNOSIS — Z7901 Long term (current) use of anticoagulants: Secondary | ICD-10-CM

## 2024-03-22 DIAGNOSIS — L97516 Non-pressure chronic ulcer of other part of right foot with bone involvement without evidence of necrosis: Secondary | ICD-10-CM | POA: Diagnosis not present

## 2024-03-22 DIAGNOSIS — I7090 Unspecified atherosclerosis: Secondary | ICD-10-CM | POA: Diagnosis not present

## 2024-03-22 DIAGNOSIS — I132 Hypertensive heart and chronic kidney disease with heart failure and with stage 5 chronic kidney disease, or end stage renal disease: Secondary | ICD-10-CM | POA: Diagnosis present

## 2024-03-22 DIAGNOSIS — E1122 Type 2 diabetes mellitus with diabetic chronic kidney disease: Secondary | ICD-10-CM | POA: Diagnosis present

## 2024-03-22 LAB — COMPREHENSIVE METABOLIC PANEL WITH GFR
ALT: 21 U/L (ref 0–44)
AST: 24 U/L (ref 15–41)
Albumin: 2.7 g/dL — ABNORMAL LOW (ref 3.5–5.0)
Alkaline Phosphatase: 89 U/L (ref 38–126)
Anion gap: 13 (ref 5–15)
BUN: 13 mg/dL (ref 8–23)
CO2: 32 mmol/L (ref 22–32)
Calcium: 8 mg/dL — ABNORMAL LOW (ref 8.9–10.3)
Chloride: 91 mmol/L — ABNORMAL LOW (ref 98–111)
Creatinine, Ser: 1.89 mg/dL — ABNORMAL HIGH (ref 0.61–1.24)
GFR, Estimated: 39 mL/min — ABNORMAL LOW (ref >60)
Glucose, Bld: 162 mg/dL — ABNORMAL HIGH (ref 70–99)
Potassium: 3.8 mmol/L (ref 3.5–5.1)
Sodium: 136 mmol/L (ref 135–145)
Total Bilirubin: 0.7 mg/dL (ref 0.0–1.2)
Total Protein: 6.9 g/dL (ref 6.5–8.1)

## 2024-03-22 LAB — I-STAT CG4 LACTIC ACID, ED: Lactic Acid, Venous: 2 mmol/L (ref 0.5–1.9)

## 2024-03-22 LAB — CBC WITH DIFFERENTIAL/PLATELET
Abs Immature Granulocytes: 0.12 K/uL — ABNORMAL HIGH (ref 0.00–0.07)
Basophils Absolute: 0 K/uL (ref 0.0–0.1)
Basophils Relative: 0 %
Eosinophils Absolute: 0 K/uL (ref 0.0–0.5)
Eosinophils Relative: 0 %
HCT: 31 % — ABNORMAL LOW (ref 39.0–52.0)
Hemoglobin: 9.4 g/dL — ABNORMAL LOW (ref 13.0–17.0)
Immature Granulocytes: 1 %
Lymphocytes Relative: 3 %
Lymphs Abs: 0.4 K/uL — ABNORMAL LOW (ref 0.7–4.0)
MCH: 30.6 pg (ref 26.0–34.0)
MCHC: 30.3 g/dL (ref 30.0–36.0)
MCV: 101 fL — ABNORMAL HIGH (ref 80.0–100.0)
Monocytes Absolute: 0.6 K/uL (ref 0.1–1.0)
Monocytes Relative: 5 %
Neutro Abs: 11 K/uL — ABNORMAL HIGH (ref 1.7–7.7)
Neutrophils Relative %: 91 %
Platelets: 161 K/uL (ref 150–400)
RBC: 3.07 MIL/uL — ABNORMAL LOW (ref 4.22–5.81)
RDW: 16.5 % — ABNORMAL HIGH (ref 11.5–15.5)
WBC: 12.1 K/uL — ABNORMAL HIGH (ref 4.0–10.5)
nRBC: 0 % (ref 0.0–0.2)

## 2024-03-22 LAB — PROTIME-INR
INR: 1.1 (ref 0.8–1.2)
Prothrombin Time: 14.9 s (ref 11.4–15.2)

## 2024-03-22 LAB — SEDIMENTATION RATE: Sed Rate: 68 mm/h — ABNORMAL HIGH (ref 0–16)

## 2024-03-22 LAB — HIV ANTIBODY (ROUTINE TESTING W REFLEX): HIV Screen 4th Generation wRfx: NONREACTIVE

## 2024-03-22 LAB — RESP PANEL BY RT-PCR (RSV, FLU A&B, COVID)  RVPGX2
Influenza A by PCR: NEGATIVE
Influenza B by PCR: NEGATIVE
Resp Syncytial Virus by PCR: NEGATIVE
SARS Coronavirus 2 by RT PCR: POSITIVE — AB

## 2024-03-22 MED ORDER — LACTATED RINGERS IV BOLUS
1000.0000 mL | Freq: Once | INTRAVENOUS | Status: AC
  Administered 2024-03-22: 1000 mL via INTRAVENOUS

## 2024-03-22 MED ORDER — TAMSULOSIN HCL 0.4 MG PO CAPS
0.4000 mg | ORAL_CAPSULE | ORAL | Status: DC
  Administered 2024-03-25: 0.4 mg via ORAL
  Filled 2024-03-22: qty 1

## 2024-03-22 MED ORDER — VANCOMYCIN HCL 750 MG/150ML IV SOLN: 750.0000 mg | INTRAVENOUS | Status: DC

## 2024-03-22 MED ORDER — FERROUS SULFATE 325 (65 FE) MG PO TABS
325.0000 mg | ORAL_TABLET | Freq: Every day | ORAL | Status: DC
  Administered 2024-03-23 – 2024-03-25 (×3): 325 mg via ORAL
  Filled 2024-03-22 (×3): qty 1

## 2024-03-22 MED ORDER — METRONIDAZOLE 500 MG/100ML IV SOLN
500.0000 mg | Freq: Once | INTRAVENOUS | Status: AC
  Administered 2024-03-22: 500 mg via INTRAVENOUS
  Filled 2024-03-22: qty 100

## 2024-03-22 MED ORDER — BETHANECHOL CHLORIDE 5 MG PO TABS
5.0000 mg | ORAL_TABLET | Freq: Three times a day (TID) | ORAL | Status: DC
  Administered 2024-03-23 – 2024-03-25 (×8): 5 mg via ORAL
  Filled 2024-03-22 (×10): qty 1

## 2024-03-22 MED ORDER — ALBUTEROL SULFATE (2.5 MG/3ML) 0.083% IN NEBU: 2.5000 mg | INHALATION_SOLUTION | RESPIRATORY_TRACT | Status: DC | PRN

## 2024-03-22 MED ORDER — CALCITRIOL 0.25 MCG PO CAPS
0.2500 ug | ORAL_CAPSULE | ORAL | Status: DC
  Administered 2024-03-23: 0.25 ug via ORAL
  Filled 2024-03-22 (×2): qty 1

## 2024-03-22 MED ORDER — METOPROLOL SUCCINATE ER 25 MG PO TB24: 12.5000 mg | ORAL_TABLET | ORAL | Status: DC

## 2024-03-22 MED ORDER — ATORVASTATIN CALCIUM 10 MG PO TABS
20.0000 mg | ORAL_TABLET | Freq: Every day | ORAL | Status: DC
  Administered 2024-03-23 – 2024-03-25 (×3): 20 mg via ORAL
  Filled 2024-03-22 (×3): qty 2

## 2024-03-22 MED ORDER — FERRIC CITRATE 1 GM 210 MG(FE) PO TABS
210.0000 mg | ORAL_TABLET | Freq: Three times a day (TID) | ORAL | Status: DC
  Administered 2024-03-23 – 2024-03-25 (×8): 210 mg via ORAL
  Filled 2024-03-22 (×8): qty 1

## 2024-03-22 MED ORDER — SODIUM CHLORIDE 0.9 % IV SOLN
1.5000 g | Freq: Two times a day (BID) | INTRAVENOUS | Status: DC
  Administered 2024-03-23: 1.5 g via INTRAVENOUS
  Filled 2024-03-22 (×3): qty 4

## 2024-03-22 MED ORDER — VANCOMYCIN HCL IN DEXTROSE 1-5 GM/200ML-% IV SOLN
1000.0000 mg | Freq: Once | INTRAVENOUS | Status: AC
  Administered 2024-03-22: 1000 mg via INTRAVENOUS
  Filled 2024-03-22: qty 200

## 2024-03-22 MED ORDER — PANTOPRAZOLE SODIUM 40 MG PO TBEC
40.0000 mg | DELAYED_RELEASE_TABLET | Freq: Two times a day (BID) | ORAL | Status: DC
  Administered 2024-03-23 – 2024-03-25 (×5): 40 mg via ORAL
  Filled 2024-03-22 (×5): qty 1

## 2024-03-22 MED ORDER — ACETAMINOPHEN 325 MG PO TABS
650.0000 mg | ORAL_TABLET | Freq: Once | ORAL | Status: AC
  Administered 2024-03-22: 650 mg via ORAL
  Filled 2024-03-22: qty 2

## 2024-03-22 MED ORDER — APIXABAN 2.5 MG PO TABS
2.5000 mg | ORAL_TABLET | Freq: Two times a day (BID) | ORAL | Status: DC
Start: 2024-03-22 — End: 2024-03-25
  Administered 2024-03-23 – 2024-03-25 (×6): 2.5 mg via ORAL
  Filled 2024-03-22 (×6): qty 1

## 2024-03-22 MED ORDER — ACETAMINOPHEN 650 MG RE SUPP: 650.0000 mg | Freq: Four times a day (QID) | RECTAL | Status: DC | PRN

## 2024-03-22 MED ORDER — SODIUM CHLORIDE 0.9 % IV SOLN
2.0000 g | Freq: Once | INTRAVENOUS | Status: AC
  Administered 2024-03-22: 2 g via INTRAVENOUS
  Filled 2024-03-22: qty 12.5

## 2024-03-22 MED ORDER — HYDRALAZINE HCL 20 MG/ML IJ SOLN: 5.0000 mg | Freq: Four times a day (QID) | INTRAMUSCULAR | Status: DC | PRN

## 2024-03-22 MED ORDER — ACETAMINOPHEN 325 MG PO TABS: 650.0000 mg | ORAL_TABLET | Freq: Four times a day (QID) | ORAL | Status: DC | PRN

## 2024-03-22 NOTE — ED Provider Notes (Addendum)
 [place] EMERGENCY DEPARTMENT AT [facility] Provider Note   CSN: [id number] Arrival date & time: 03/22/24  1536     Patient presents with: Wound Infection and Abscess   [name] is a 68 y.o. male.   Patient brought in by EMS.  Patient is was brought in from dialysis.  Is normally dialyzed Monday Wednesdays and Fridays.  They did not dialyze him today because he had a cough.  Also upon arrival here has a fever to 101.  Does have a wound infection to the right heel area.  Patient normally at the facility.  Patient has had a cough for a while.  Denies any chest pain shortness of breath denies any abdominal pain nausea vomiting or diarrhea.  Patient last dialyzed on Wednesday.  Temp here 101 heart rate 106 blood pressure 99/46 oxygen sats 95% on room air.  Patient was admitted for sepsis back in January.  Patient at that time did have a stage II ankle pressure injury.  Patient is on Eliquis .  Past medical history significant for hepatitis hypertension diabetes necrotizing fasciitis congestive heart failure hyperlipidemia end-stage renal disease on hemodialysis.  Says been on hemodialysis for more than 4 years.  Patient's had his gallbladder removed.  Patient has an AV fistula left upper extremity.  Patient is from [employer].       Prior to Admission medications   Medication Sig Start Date End Date Taking? Authorizing Provider  amiodarone  (PACERONE ) 200 MG tablet Take 1 tablet (200 mg total) by mouth daily. 10/15/22 08/25/23  McDiarmid, Krystal BIRCH, MD  apixaban  (ELIQUIS ) 2.5 MG TABS tablet Take 1 tablet (2.5 mg total) by mouth 2 (two) times daily. 07/21/23   Caleen Qualia, MD  atorvastatin  (LIPITOR) 20 MG tablet Take 1 tablet (20 mg total) by mouth daily. 12/17/19   Sherlean Failing, MD  bethanechol  (URECHOLINE ) 10 MG tablet Take 5 mg by mouth 3 (three) times daily.    [provider]  calcitRIOL  (ROCALTROL ) 0.25 MCG capsule Take 1 capsule (0.25 mcg total) by  mouth Every Tuesday,Thursday,and Saturday with dialysis. 07/22/23   Amin, Sumayya, MD  ferric citrate  (AURYXIA) 1 GM 210 MG(Fe) tablet Take 210 mg by mouth. 07/29/23   [provider]  ferrous sulfate  325 (65 FE) MG tablet Take 325 mg by mouth daily with breakfast.    [provider]  metoprolol  succinate (TOPROL -XL) 25 MG 24 hr tablet Take 12.5 mg by mouth 3 (three) times a week. 12/20/22   [provider]  Nystatin (GERHARDT'S BUTT CREAM) CREA Apply 1 Application topically 2 (two) times daily. Apply to skin of sacrum/buttocks 2 times daily and prn soiling. 08/29/23   Pokhrel, Laxman, MD  pantoprazole  (PROTONIX ) 40 MG tablet Take 1 tablet (40 mg total) by mouth 2 (two) times daily before a meal. 07/21/23   Caleen Qualia, MD  PROMETHAZINE  HCL IM Inject 25 mg into the muscle every 4 (four) hours as needed (Nausea and Vomiting).    [provider]  tamsulosin  (FLOMAX ) 0.4 MG CAPS capsule Take 0.4 mg by mouth See admin instructions. Take one tablet by mouth 3 times a week on Monday, Wednesday and Fridays 12/20/22   [provider]    Allergies: Tramadol     Review of Systems  Constitutional:  Positive for fever. Negative for chills.  HENT:  Negative for ear pain and sore throat.   Eyes:  Negative for pain and visual disturbance.  Respiratory:  Positive for cough. Negative for shortness of  breath.   Cardiovascular:  Negative for chest pain and palpitations.  Gastrointestinal:  Negative for abdominal pain and vomiting.  Genitourinary:  Negative for dysuria and hematuria.  Musculoskeletal:  Negative for arthralgias and back pain.  Skin:  Positive for wound. Negative for color change and rash.  Neurological:  Negative for seizures and syncope.  All other systems reviewed and are negative.   Updated Vital Signs BP (!) 99/46   Pulse (!) 106   Temp (!) 101 F (38.3 C)   Ht 1.6 m (5' 3)   Wt 45.4 kg   SpO2 95%   BMI 17.71 kg/m   Physical  Exam Vitals and nursing note reviewed.  Constitutional:      General: He is not in acute distress.    Appearance: Normal appearance. He is well-developed. He is not ill-appearing.  HENT:     Head: Normocephalic and atraumatic.  Eyes:     Extraocular Movements: Extraocular movements intact.     Conjunctiva/sclera: Conjunctivae normal.     Pupils: Pupils are equal, round, and reactive to light.  Cardiovascular:     Rate and Rhythm: Regular rhythm. Tachycardia present.     Heart sounds: No murmur heard. Pulmonary:     Effort: Pulmonary effort is normal. No respiratory distress.     Breath sounds: Normal breath sounds. No wheezing, rhonchi or rales.  Abdominal:     Palpations: Abdomen is soft.     Tenderness: There is no abdominal tenderness.  Musculoskeletal:        General: No swelling.     Cervical back: Normal range of motion and neck supple. No rigidity.     Comments: Wound to the right ankle area not deep.  Probably measuring about 3 cm.  Had some amputation some of his toes of the right foot.  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  AV fistula right upper extremity.  Has some's some skin scabs.  But nothing that seems to be significantly infected.  Skin:    General: Skin is warm and dry.     Capillary Refill: Capillary refill takes less than 2 seconds.  Neurological:     Mental Status: He is alert and oriented to person, place, and time. Mental status is at baseline.  Psychiatric:        Mood and Affect: Mood normal.     (all labs ordered are listed, but only abnormal results are displayed) Labs Reviewed  COMPREHENSIVE METABOLIC PANEL WITH GFR - Abnormal; Notable for the following components:      Result Value   Chloride 91 (*)    Glucose, Bld 162 (*)    Creatinine, Ser 1.89 (*)    Calcium  8.0 (*)    Albumin  2.7 (*)    GFR, Estimated 39 (*)    All other components within normal limits  CBC WITH DIFFERENTIAL/PLATELET - Abnormal; Notable for the  following components:   WBC 12.1 (*)    RBC 3.07 (*)    Hemoglobin 9.4 (*)    HCT 31.0 (*)    MCV 101.0 (*)    RDW 16.5 (*)    Neutro Abs 11.0 (*)    Lymphs Abs 0.4 (*)    Abs Immature Granulocytes 0.12 (*)    All other components within normal limits  CULTURE, BLOOD (ROUTINE X 2)  CULTURE, BLOOD (ROUTINE X 2)  RESP PANEL BY RT-PCR (RSV, FLU A&B, COVID)  RVPGX2  PROTIME-INR  URINALYSIS, W/ REFLEX TO CULTURE (  INFECTION SUSPECTED)  I-STAT CG4 LACTIC ACID, ED  I-STAT CG4 LACTIC ACID, ED    EKG: EKG Interpretation Date/Time:  Friday March 22 2024 16:00:21 EDT Ventricular Rate:  124 PR Interval:  126 QRS Duration:  100 QT Interval:  297 QTC Calculation: 427 R Axis:   -70  Text Interpretation: Ectopic atrial tachycardia, unifocal LAD, consider LAFB or inferior infarct No significant change since last tracing Confirmed by Giovanna Kemmerer [phone]) on 03/22/2024 4:10:04 PM  Radiology: ARCOLA Foot Complete Right Result Date: 03/22/2024 CLINICAL DATA:  Infection EXAM: RIGHT FOOT COMPLETE - 3+ VIEW COMPARISON:  X-ray right foot 08/24/2023 FINDINGS: Limited evaluation due to overlapping osseous structures and overlying soft tissues. Question cortical erosion and destruction of the fourth digit phalanges. Status post first and second digit phalangeal amputations, status post fifth digit metatarsal body amputation. No cortical erosion or destruction. There is no evidence of fracture or dislocation. Plantar and posterior calcaneal spur. No aggressive appearing focal bone abnormality. Forefoot subcutaneus soft tissue edema. Vascular calcification. IMPRESSION: Question cortical erosion destruction of the fourth digit phalanges. Limited evaluation due to overlapping osseous structures and overlying soft tissues. If high clinical concern, please consider MRI for further evaluation (with intravenous contrast if GFR greater than 30). Electronically Signed   By: Morgane  Naveau M.D.   On: 03/22/2024 17:28    DG Chest Port 1 View Result Date: 03/22/2024 CLINICAL DATA:  sepsis EXAM: PORTABLE CHEST 1 VIEW COMPARISON:  Chest x-ray 08/24/2023 FINDINGS: The heart and mediastinal contours are unchanged. No focal consolidation. No pulmonary edema. No pleural effusion. No pneumothorax. No acute osseous abnormality. IMPRESSION: No active disease. Electronically Signed   By: Morgane  Naveau M.D.   On: 03/22/2024 17:25     Procedures   Medications Ordered in the ED  acetaminophen  (TYLENOL ) tablet 650 mg (650 mg Oral Given 03/22/24 1601)  lactated ringers  bolus 1,000 mL (1,000 mLs Intravenous New Bag/Given 03/22/24 1714)  ceFEPIme  (MAXIPIME ) 2 g in sodium chloride  0.9 % 100 mL IVPB (0 g Intravenous Stopped 03/22/24 1824)  metroNIDAZOLE  (FLAGYL ) IVPB 500 mg (0 mg Intravenous Stopped 03/22/24 1824)  vancomycin  (VANCOCIN ) IVPB 1000 mg/200 mL premix (0 mg Intravenous Stopped 03/22/24 1824)                                    Medical Decision Making Amount and/or Complexity of Data Reviewed Radiology: ordered.  Risk Prescription drug management. Decision regarding hospitalization.   CRITICAL CARE Performed by: Tanairy Payeur Total critical care time: 45 minutes Critical care time was exclusive of separately billable procedures and treating other patients. Critical care was necessary to treat or prevent imminent or life-threatening deterioration. Critical care was time spent personally by me on the following activities: development of treatment plan with patient and/or surrogate as well as nursing, discussions with consultants, evaluation of patient's response to treatment, examination of patient, obtaining history from patient or surrogate, ordering and performing treatments and interventions, ordering and review of laboratory studies, ordering and review of radiographic studies, pulse oximetry and re-evaluation of patient's condition.  Patient's vital signs concerning for possible sepsis.  Patient is a  dialysis patient.  I will not give large amount of fluids.  Will see what his labs show.  But will initiate broad-spectrum antibiotics.  Will also get x-ray of the right ankle.  Patient's complete metabolic panel is actually fairly reassuring albumin  low at 2.7 but GFR at 39 with potassium  3.8 remember he is a dialysis patient.  Creatinine 1.89.  Anion gap of 13 CBC white count 12 hemoglobin 9.4 and platelets 161.  INR normal at 1.1 blood cultures x 2 were sent and are pending.  Chest x-ray without any acute findings x-ray of the foot raising some concerns about cortical destruction of the fourth digit phalanges.  But we are more worried about the heel area.  But no real mention about that.  But x-ray of the ankle is pending.  Reviewed the x-ray myself without any soft tissue gas.  Will see if there is any concerns for osteo.  But patient has been treated with broad-spectrum antibiotics.  His art respiratory panel is pending.  Pressures have stabilized out pretty much systolic above 110.  I discussed with nephrology.  They did call the unit.  Patient did have complete dialysis done today that would explain why his numbers look as good as they do.    Patient's COVID test positive.  Lactic acid 2.0.  Discussed with hospitalist they will admit nephrology knows about him.  This may be the cause of the fever.  But also there are still these right heel wounds of concern.   Final diagnoses:  ESRD on dialysis (HCC)  Sepsis, due to unspecified organism, unspecified whether acute organ dysfunction present [facility])  Ulcer of right foot, unspecified ulcer stage [facility])    ED Discharge Orders     None          Geraldene Hamilton, MD 03/22/24 1626    Geraldene Hamilton, MD 03/22/24 CATHLYN    Ladeja Pelham, MD 03/22/24 [id number]    Harlow Basley, MD 03/22/24 8160    Haneef Hallquist, MD 03/22/24 1851    Dillion Stowers, MD 03/22/24 1941

## 2024-03-22 NOTE — ED Provider Notes (Signed)
 MSE was initiated and I personally evaluated the patient and placed orders (if any) at  3:49 PM on March 22, 2024.  The patient appears stable so that the remainder of the MSE may be completed by another provider.  He has a history of end-stage renal disease on dialysis and completed dialysis today.  Lives at a facility.  Sent in for tachycardia and fever.  Has some wounds on his left hand and at his dialysis access.  Also has a wound on his left heel.  Denies any cough vomiting or diarrhea.  Did not know he had a fever.  Tachycardic.  Few rhonchi on cough.  Abdomen soft.   Towana Ozell BROCKS, MD 03/22/24 [id number]

## 2024-03-22 NOTE — H&P (Signed)
 History and Physical    Patient: [name] FMW:[id number] DOB: May 23, 1956 DOA: 03/22/2024 DOS: the patient was seen and examined on 03/22/2024 PCP: Patient, No Pcp Per  Patient coming from: Home  Chief Complaint: Patient was sent in from dialysis center on account of fever. Chief Complaint  Patient presents with   Wound Infection   Abscess   HPI: Patient is a poor historian hence most of the history was obtained from ED physician and EMR.   Ian Frane is a 68 y.o. male with medical history significant of end-stage renal disease on hemodialysis, CHF/HFrEF, peripheral arterial disease status post left BKA and multiple right toe amputations.  Admitted in the past for necrotizing fasciitis.  Patient presented to the emergency room after dialysis center reported fever and tachycardia during dialysis.  Patient had reported coughing episodes but denied any shortness of breath.  He was brought in to be evaluated for and worked up for his fever.  On presentation, his temperature was noted to be 101 Fahrenheit.  Heart rate was 110.  BP was 118/72 and oxygen saturation of 94% on room air. He tested positive for COVID-19.  Review of Systems: Unable to review all systems due to lack of cooperation from patient. Past Medical History:  Diagnosis Date   CHF (congestive heart failure) (HCC)    Diabetes mellitus without complication (HCC)    PATIENT JUST LEARNED HE WAS DIABETIC   ESRD on hemodialysis (HCC)    TTS at [facility]   Hepatitis    Hyperlipemia    Hypertension    Necrotizing fasciitis (HCC)    Pneumonia    HX OF PNA   Past Surgical History:  Procedure Laterality Date   A/V FISTULAGRAM N/A 08/16/2023   Procedure: A/V Fistulagram;  Surgeon: Melia Lynwood ORN, MD;  Location: [facility] INVASIVE CV LAB;  Service: Cardiovascular;  Laterality: N/A;   AMPUTATION Left 09/11/2013   Procedure: AMPUTATION BELOW KNEE;  Surgeon: Kay Ozell Cummins, MD;  Location: [facility] OR;  Service: Orthopedics;   Laterality: Left;   AMPUTATION Right 12/18/2013   Procedure: RIGHT FOOT 1,2, TOE AMPUTATION  5th toe RAY AMPUTATION;  Surgeon: Kay Ozell Cummins, MD;  Location: [facility] OR;  Service: Orthopedics;  Laterality: Right;   AV FISTULA PLACEMENT Left 12/11/2019   Procedure: LEFT BRACHIOCEPHALIC FISTULA CREATION;  Surgeon: Oris Krystal FALCON, MD;  Location: [facility] OR;  Service: Vascular;  Laterality: Left;   BIOPSY  07/19/2023   Procedure: BIOPSY;  Surgeon: Albertus Gordy HERO, MD;  Location: [facility] ENDOSCOPY;  Service: Gastroenterology;;   CHOLECYSTECTOMY     ESOPHAGOGASTRODUODENOSCOPY (EGD) WITH PROPOFOL  N/A 07/19/2023   Procedure: ESOPHAGOGASTRODUODENOSCOPY (EGD) WITH PROPOFOL ;  Surgeon: Albertus Gordy HERO, MD;  Location: [facility] ENDOSCOPY;  Service: Gastroenterology;  Laterality: N/A;   HOT HEMOSTASIS N/A 07/19/2023   Procedure: HOT HEMOSTASIS (ARGON PLASMA COAGULATION/BICAP);  Surgeon: Albertus Gordy HERO, MD;  Location: [facility] ENDOSCOPY;  Service: Gastroenterology;  Laterality: N/A;  stomach   I & D EXTREMITY Left 09/03/2013   Procedure: IRRIGATION AND DEBRIDEMENT EXTREMITY;  Surgeon: Kay Ozell Cummins, MD;  Location: [facility] OR;  Service: Orthopedics;  Laterality: Left;   I & D EXTREMITY Left 09/11/2013   Procedure: LEFT FOOT IRRIGATION AND DEBRIDEMENT;  Surgeon: Kay Ozell Cummins, MD;  Location: [facility] OR;  Service: Orthopedics;  Laterality: Left;   INSERTION OF DIALYSIS CATHETER  12/11/2019   Procedure: Insertion Of Dialysis Catheter;  Surgeon: Oris Krystal FALCON, MD;  Location: [facility] OR;  Service: Vascular;;   INSERTION OF DIALYSIS CATHETER Right  07/11/2022   Procedure: INSERTION OF TUNNELED DIALYSIS CATHETER;  Surgeon: Eliza Lonni RAMAN, MD;  Location: [facility] OR;  Service: Vascular;  Laterality: Right;   LEFT HEART CATH AND CORONARY ANGIOGRAPHY N/A 12/13/2019   Procedure: LEFT HEART CATH AND CORONARY ANGIOGRAPHY;  Surgeon: Dann Candyce RAMAN, MD;  Location: [facility] INVASIVE CV LAB;  Service: Cardiovascular;  Laterality: N/A;   PERIPHERAL VASCULAR BALLOON  ANGIOPLASTY Left 08/16/2023   Procedure: PERIPHERAL VASCULAR BALLOON ANGIOPLASTY;  Surgeon: Melia Lynwood ORN, MD;  Location: [facility] INVASIVE CV LAB;  Service: Cardiovascular;  Laterality: Left;   REVISON OF ARTERIOVENOUS FISTULA Left 07/11/2022   Procedure: REVISON OF LEFT ARM ARTERIOVENOUS FISTULA;  Surgeon: Eliza Lonni RAMAN, MD;  Location: [facility] OR;  Service: Vascular;  Laterality: Left;   RIGHT HEART CATH N/A 12/13/2019   Procedure: RIGHT HEART CATH;  Surgeon: Dann Candyce RAMAN, MD;  Location: [facility] INVASIVE CV LAB;  Service: Cardiovascular;  Laterality: N/A;   Social History:  reports that he has never smoked. He has never used smokeless tobacco. He reports that he does not drink alcohol and does not use drugs.  Allergies  Allergen Reactions   Tramadol  Nausea And Vomiting    Pt states he took this on an empty stomach one time and it came right back up. He's not had it since.     Family History  Problem Relation Age of Onset   Diabetes type II Mother    Dementia Mother    Heart disease Father     Prior to Admission medications   Medication Sig Start Date End Date Taking? Authorizing Provider  amiodarone  (PACERONE ) 200 MG tablet Take 1 tablet (200 mg total) by mouth daily. 10/15/22 08/25/23  McDiarmid, Krystal BIRCH, MD  apixaban  (ELIQUIS ) 2.5 MG TABS tablet Take 1 tablet (2.5 mg total) by mouth 2 (two) times daily. 07/21/23   Caleen Qualia, MD  atorvastatin  (LIPITOR) 20 MG tablet Take 1 tablet (20 mg total) by mouth daily. 12/17/19   Sherlean Failing, MD  bethanechol  (URECHOLINE ) 10 MG tablet Take 5 mg by mouth 3 (three) times daily.    [provider]  calcitRIOL  (ROCALTROL ) 0.25 MCG capsule Take 1 capsule (0.25 mcg total) by mouth Every Tuesday,Thursday,and Saturday with dialysis. 07/22/23   Amin, Sumayya, MD  ferric citrate  (AURYXIA) 1 GM 210 MG(Fe) tablet Take 210 mg by mouth. 07/29/23   [provider]  ferrous sulfate  325 (65 FE) MG tablet Take 325 mg by mouth daily with  breakfast.    [provider]  metoprolol  succinate (TOPROL -XL) 25 MG 24 hr tablet Take 12.5 mg by mouth 3 (three) times a week. 12/20/22   [provider]  Nystatin (GERHARDT'S BUTT CREAM) CREA Apply 1 Application topically 2 (two) times daily. Apply to skin of sacrum/buttocks 2 times daily and prn soiling. 08/29/23   Pokhrel, Laxman, MD  pantoprazole  (PROTONIX ) 40 MG tablet Take 1 tablet (40 mg total) by mouth 2 (two) times daily before a meal. 07/21/23   Caleen Qualia, MD  PROMETHAZINE  HCL IM Inject 25 mg into the muscle every 4 (four) hours as needed (Nausea and Vomiting).    [provider]  tamsulosin  (FLOMAX ) 0.4 MG CAPS capsule Take 0.4 mg by mouth See admin instructions. Take one tablet by mouth 3 times a week on Monday, Wednesday and Fridays 12/20/22   [provider]    Physical Exam: Vitals:   03/22/24 1542 03/22/24 1543 03/22/24 1600  BP: (!) 99/46  (!) 157/59  Pulse: (!) 106  ROLLEN)  123  Resp:   (!) 26  Temp: (!) 101 F (38.3 C)    SpO2: 95%  92%  Weight:  45.4 kg   Height:  5' 3 (1.6 m)    General: Patient is a frail appearing gentleman who was seen laying comfortably in bed.  Not in any acute distress.  He is a poor historian and was not forthcoming with information.  Unknown baseline.  No family member at bedside. HEENT: Patient has a mask in place.  Unable to visualize oral mucosa or nasopharynx.  He is uncooperative with physical exam Neck: Supple with no JVD. Chest: Clinically diminished due to poor respiratory effort.  No dyspnea or tachypnea. Abdomen: Soft nontender Extremities significant for left BKA.  Stump looks clean and dry.  Right foot with dried up skin.  Right small toe looks more formed.  Slight discharge.  Small heel ulcer appreciated.  Left arm with AV fistula in place, no surrounding erythema appreciated.  Data Reviewed: Labs significant for sodium 136, potassium 3.8, chloride 91, bicarb 32, glucose 162, BUN 13, creatinine  1.89, AST 24, ALT 21, total bilirubin 0.7, WBC 12.1, hemoglobin 9.4, hematocrit 31, platelet count is 161 neutrophil count of 91.  Last A1c was 4.0  X-ray of the right fifth toe shows cortical erosion destruction of the fourth digit phalanges of the foot.  Patient may benefit from MRI for further evaluation.  Chest x-ray shows no active disease.  Assessment and Plan:  68 year old frail Caucasian gentleman with history of end-stage renal disease on hemodialysis via left forearm AV fistula, other comorbidities include peripheral arterial disease status post left BKA, chronic right foot ulcer, presents from dialysis center on account of fever of 101 Fahrenheit.  Tested positive for COVID.  Other possible sources of infection include right foot ulcer.  1. Fever likely secondary to COVID-19 infection.  Other possible source of infection include right foot ulcers on right small toe and heel .  X-ray suggest cortical erosion of the fourth digit phalanges.  Concerns for osteomyelitis.  Will obtain ESR and MRI of the foot for further delineation.  Patient will be empirically covered with IV antibiotics.  For his COVID, patient is asymptomatic and will be managed conservatively.  2. End-stage renal disease on hemodialysis.  Patient completed his dialysis today.  Nephrology will be consulted to help with hemodialysis maintenance.  3.  Chronic HFrEF: Looks compensated.  Will continue with current medications.  4.  Atrial fibrillation: Rate controlled on current regimen with metoprolol .  Unclear if patient is still on amiodarone (Per EMR expiry date was 08/25/2023).  Will consult with pharmacy and cardiology for clarification.  Patient is adequately anticoagulated with Eliquis .  5.  Hypertension: Patient is on metoprolol  12.5 mg.  Will continue with same.  6.  Chronic anemia: Hemoglobin remained stable on current dose of Eliquis .  Monitor H&H in the context of history of GI blood loss.   Advance Care  Planning:   Code Status: Full Code   Consults:Nephrology  Family Communication: No family at bedside  Severity of Illness: The appropriate patient status for this patient is INPATIENT. Inpatient status is judged to be reasonable and necessary in order to provide the required intensity of service to ensure the patient's safety. The patient's presenting symptoms, physical exam findings, and initial radiographic and laboratory data in the context of their chronic comorbidities is felt to place them at high risk for further clinical deterioration. Furthermore, it is not anticipated that the patient will be medically  stable for discharge from the hospital within 2 midnights of admission.   * I certify that at the point of admission it is my clinical judgment that the patient will require inpatient hospital care spanning beyond 2 midnights from the point of admission due to high intensity of service, high risk for further deterioration and high frequency of surveillance required.*  Author: Maude MARLA Dart, MD 03/22/2024 8:44 PM  For on call review [email or web address].

## 2024-03-22 NOTE — Progress Notes (Signed)
 Pharmacy Antibiotic Note  [name] is a 68 y.o. male for which pharmacy has been consulted for ampicillin -sulbactam and vancomycin  dosing for wound infection.  Patient with a history of ESRD on HD, HF, PAD s/p left BKA, and prior hospitalizations for necrotizing fasciitis. Patient with right foot ulcers on right small toe and heel and XR findings c/f osteomyelitis. Patient noted to have fever at dialysis session today.  WBC 12.1; LA 2; T 101; HR 123; RR 26 COVID Positive  / flu neg  Plan: Cefepime  and flagyl  given in ED x 1 Vancomycin  1g in ED 8/22 -- further dosing per nephrology plan for HD schedule Start Unasyn  1.5g q12h Monitor WBC, fever, renal function, cultures De-escalate when able F/u Nephrology plan  Height: 5' 3 (160 cm) Weight: 45.4 kg (100 lb) IBW/kg (Calculated) : 56.9  Temp (24hrs), Avg:101 F (38.3 C), Min:101 F (38.3 C), Max:101 F (38.3 C)  Recent Labs  Lab 03/22/24 1646 03/22/24 1854  WBC 12.1*  --   CREATININE 1.89*  --   LATICACIDVEN  --  2.0*    Estimated Creatinine Clearance: 25 mL/min (A) (by C-G formula based on SCr of 1.89 mg/dL (H)).    Allergies  Allergen Reactions   Tramadol  Nausea And Vomiting    Pt states he took this on an empty stomach one time and it came right back up. He's not had it since.    Microbiology results: Pending  Thank you for allowing pharmacy to be a part of this patient's care.  Dorn Buttner, PharmD, BCPS 03/22/2024 9:46 PM ED Clinical Pharmacist -  [phone]

## 2024-03-22 NOTE — ED Notes (Signed)
 CCMD called by this NT.

## 2024-03-22 NOTE — ED Triage Notes (Signed)
 The patient presents via [facility] with CC of wound infections on left hand and arm. The patient has also been developing a cough as well with no production. The pt is coming from dialysis, where he did complete treat ment. Primarily dialysis was concerned about his vitals/wounds.   Respirations 36 HR 110 BP 118/72 O2 94 RA  Disoriented to time - unknown if this is baseline by dialysis staff and EMS.   CBG 172 No meds/Ivs/ECG by EMS

## 2024-03-23 DIAGNOSIS — L89502 Pressure ulcer of unspecified ankle, stage 2: Secondary | ICD-10-CM

## 2024-03-23 DIAGNOSIS — U071 COVID-19: Secondary | ICD-10-CM | POA: Diagnosis not present

## 2024-03-23 LAB — CBC
HCT: 29 % — ABNORMAL LOW (ref 39.0–52.0)
Hemoglobin: 9 g/dL — ABNORMAL LOW (ref 13.0–17.0)
MCH: 31.1 pg (ref 26.0–34.0)
MCHC: 31 g/dL (ref 30.0–36.0)
MCV: 100.3 fL — ABNORMAL HIGH (ref 80.0–100.0)
Platelets: 148 K/uL — ABNORMAL LOW (ref 150–400)
RBC: 2.89 MIL/uL — ABNORMAL LOW (ref 4.22–5.81)
RDW: 16.8 % — ABNORMAL HIGH (ref 11.5–15.5)
WBC: 9.9 K/uL (ref 4.0–10.5)
nRBC: 0 % (ref 0.0–0.2)

## 2024-03-23 LAB — BASIC METABOLIC PANEL WITH GFR
Anion gap: 8 (ref 5–15)
BUN: 24 mg/dL — ABNORMAL HIGH (ref 8–23)
CO2: 28 mmol/L (ref 22–32)
Calcium: 8.2 mg/dL — ABNORMAL LOW (ref 8.9–10.3)
Chloride: 98 mmol/L (ref 98–111)
Creatinine, Ser: 2.82 mg/dL — ABNORMAL HIGH (ref 0.61–1.24)
GFR, Estimated: 24 mL/min — ABNORMAL LOW (ref >60)
Glucose, Bld: 118 mg/dL — ABNORMAL HIGH (ref 70–99)
Potassium: 4.1 mmol/L (ref 3.5–5.1)
Sodium: 134 mmol/L — ABNORMAL LOW (ref 135–145)

## 2024-03-23 LAB — MRSA NEXT GEN BY PCR, NASAL: MRSA by PCR Next Gen: NOT DETECTED

## 2024-03-23 MED ORDER — SODIUM CHLORIDE 0.9 % IV SOLN
1.0000 g | INTRAVENOUS | Status: DC
  Administered 2024-03-23 – 2024-03-24 (×2): 1 g via INTRAVENOUS
  Filled 2024-03-23 (×2): qty 10
  Filled 2024-03-23: qty 1

## 2024-03-23 MED ORDER — PROMETHAZINE HCL 25 MG/ML IJ SOLN: 25.0000 mg | INTRAMUSCULAR | Status: DC | PRN

## 2024-03-23 MED ORDER — CLOTRIMAZOLE 1 % EX CREA: TOPICAL_CREAM | Freq: Every day | CUTANEOUS | Status: DC | PRN

## 2024-03-23 MED ORDER — AMIODARONE HCL 200 MG PO TABS
200.0000 mg | ORAL_TABLET | Freq: Every day | ORAL | Status: DC
  Administered 2024-03-23 – 2024-03-25 (×3): 200 mg via ORAL
  Filled 2024-03-23 (×3): qty 1

## 2024-03-23 MED ORDER — FOLIC ACID 1 MG PO TABS
1.0000 mg | ORAL_TABLET | Freq: Every day | ORAL | Status: DC
  Administered 2024-03-23 – 2024-03-25 (×3): 1 mg via ORAL
  Filled 2024-03-23 (×3): qty 1

## 2024-03-23 MED ORDER — VITAMIN B-12 1000 MCG PO TABS
1000.0000 ug | ORAL_TABLET | Freq: Every day | ORAL | Status: DC
  Administered 2024-03-23 – 2024-03-25 (×3): 1000 ug via ORAL
  Filled 2024-03-23 (×3): qty 1

## 2024-03-23 MED ORDER — METOPROLOL TARTRATE 12.5 MG HALF TABLET
12.5000 mg | ORAL_TABLET | ORAL | Status: DC
  Filled 2024-03-23: qty 1

## 2024-03-23 NOTE — Progress Notes (Signed)
 PROGRESS NOTE [name]  FMW:[id number] DOB: 1956/01/30 DOA: 03/22/2024 PCP: Patient, No Pcp Per  Brief Narrative/Hospital Course: 76 YOM w/ PMH of ESRD on HD MW, HFrEF, PVD s/p left BKA and multiple right toe amputations admitted in the past for necrotizing fasciitis patient presented to the ED room after dialysis center reported fever and tachycardia during dialysis. Patient had reported coughing episodes but denied any shortness of breath.He was brought in to be evaluated for and worked up for his fever.  In ZI:uzfezmjulmz was noted to be 101 hr 110.  BP was 118/72 and oxygen saturation of 94% on room air. He tested positive for COVID-19.  Chest x-ray no active disease.  Labs with stable potassium bicarb creatinine 1.8 CBC mild leukocytosis chronic anemia.X-ray right foot questionable cortical erosion destruction of the fourth digit phalanges Limited evaluation due to overlapping osseous structures and soft tissue.  Subjective: Seen and examined today He is alert awake oriented resting comfortably on room air no respiratory symptoms or cough Vitals stable afebrile Labs overall unremarkable with chronic anemia and acute mild thrombocytopenia MRI report was pending  Assessment and plan:  Fever COVID-19 infection: Respiratory status stable asymptomatic largely from COVID infection.  S/P impression of the right 1st and 2nd digit and fifth ray Foot infection/osteomyelitis of 4th toe rt: Patient presented with fever tachycardia from dialysis and tested positive for COVID.  X-ray however unremarkable Patient has a right foot ulcer on right small toe and heel-x-ray with some cortical erosion of the fourth digit phalanges-ESR elevated 68 MRI foot>showed ulceration of the lateral fourth toe with osteomyelitis of the underlying fourth toe proximal middle and distal phalanges  Chronic appearing ulceration along the medial heel. Moderate arthritic changes of the fourth TMT joint. Hypertrophic  osseous changes of the mid to distal fourth metatarsal, may reflect sequela of prior trauma or infection. Consult podiatry Dr Janit, he sees Dr Joya. Will dose vancomycin /cefepime  for now.  ESRD on HD MWF Got HD on Friday next due on Monday.   Chronic HFpEF  Compensated continue home meds   A-fib : Rate controlled continue metoprolol  and Eliquis , cont same  Hypertension: Stable on metoprolol   Anemia of chronic kidney disease: Hb stable. Cont  to monitor  Severe malnutrition with BMI Body mass index is 17.71 kg/m.: Augment nutritional status   Mobility: PT Orders: Active PT Follow up Rec:    DVT prophylaxis: apixaban  (ELIQUIS ) tablet 2.5 mg Start: 03/22/24 2200 SCDs Start: 03/22/24 2010 Code Status:   Code Status: Full Code Family Communication: plan of care discussed with patient at bedside. Patient status is: Remains hospitalized because of severity of illness Level of care: Med-Surg   Dispo: The patient is from: Nursing home            Anticipated disposition: Pending MRI Objective: Vitals last 24 hrs: Vitals:   03/22/24 1543 03/22/24 1600 03/22/24 2236 03/23/24 0412  BP:  (!) 157/59 (!) 114/35 (!) 134/41  Pulse:  (!) 123 82 96  Resp:  (!) 26 18 18   Temp:   98.6 F (37 C) 98.5 F (36.9 C)  TempSrc:   Oral Oral  SpO2:  92% 95% 97%  Weight: 45.4 kg     Height: 5' 3 (1.6 m)      Physical Examination: General exam: alert awake, oriented, older than stated age HEENT:Oral mucosa moist, Ear/Nose WNL grossly Respiratory system: Bilaterally clear BS,no use of accessory muscle Cardiovascular system: S1 & S2 +, No JVD. Gastrointestinal system: Abdomen soft,NT,ND, BS+ Nervous  System: Alert, awake, moving all extremities,and following commands. Extremities: LE edema neg, distal extremities warm.  Skin: No rashes,no icterus. MSK: Normal muscle bulk,tone, power   Medications reviewed:  Scheduled Meds:  amiodarone   200 mg Oral Daily   apixaban   2.5 mg Oral BID    atorvastatin   20 mg Oral Daily   bethanechol   5 mg Oral TID   calcitRIOL   0.25 mcg Oral Q T,Th,Sa-HD   ferric citrate   210 mg Oral TID WC   ferrous sulfate   325 mg Oral Q breakfast   [START ON 03/25/2024] metoprolol  tartrate  12.5 mg Oral Once per day on Monday Wednesday Friday   pantoprazole   40 mg Oral BID AC   [START ON 03/25/2024] tamsulosin   0.4 mg Oral Q M,W,F   Continuous Infusions:  ampicillin -sulbactam (UNASYN ) IV 1.5 g (03/23/24 9364)   Diet: Diet Order             Diet renal with fluid restriction Fluid restriction: 1200 mL Fluid; Room service appropriate? Yes; Fluid consistency: Thin  Diet effective now                    Data Reviewed: I have personally reviewed following labs and imaging studies ( see epic result tab) CBC: Recent Labs  Lab 03/22/24 1646 03/23/24 0557  WBC 12.1* 9.9  NEUTROABS 11.0*  --   HGB 9.4* 9.0*  HCT 31.0* 29.0*  MCV 101.0* 100.3*  PLT 161 148*   CMP: Recent Labs  Lab 03/22/24 1646 03/23/24 0557  NA 136 134*  K 3.8 4.1  CL 91* 98  CO2 32 28  GLUCOSE 162* 118*  BUN 13 24*  CREATININE 1.89* 2.82*  CALCIUM  8.0* 8.2*   GFR: Estimated Creatinine Clearance: 16.8 mL/min (A) (by C-G formula based on SCr of 2.82 mg/dL (H)). Recent Labs  Lab 03/22/24 1646  AST 24  ALT 21  ALKPHOS 89  BILITOT 0.7  PROT 6.9  ALBUMIN  2.7*   No results for input(s): LIPASE, AMYLASE in the last 168 hours. No results for input(s): AMMONIA in the last 168 hours. Coagulation Profile:  Recent Labs  Lab 03/22/24 1646  INR 1.1   Unresulted Labs (From admission, onward)     Start     Ordered   03/24/24 0500  Basic metabolic panel with GFR  Daily,   R     Question:  Specimen collection method  Answer:  Lab=Lab collect   03/23/24 0856   03/24/24 0500  CBC  Daily,   R     Question:  Specimen collection method  Answer:  Lab=Lab collect   03/23/24 0856   03/22/24 1545  Urinalysis, w/ Reflex to Culture (Infection Suspected) -Urine, Clean Catch   Once,   URGENT       Question:  Specimen Source  Answer:  Urine, Clean Catch   03/22/24 1545           Antimicrobials/Microbiology: Anti-infectives (From admission, onward)    Start     Dose/Rate Route Frequency Ordered Stop   03/23/24 0600  ampicillin -sulbactam (UNASYN ) 1.5 g in sodium chloride  0.9 % 100 mL IVPB        1.5 g 200 mL/hr over 30 Minutes Intravenous Every 12 hours 03/22/24 2153     03/22/24 2145  vancomycin  (VANCOREADY) IVPB 750 mg/150 mL  Status:  Discontinued       Note to Pharmacy: Pharmacy to dose as appropriate   750 mg 150 mL/hr over 60 Minutes Intravenous Every  24 hours 03/22/24 2132 03/22/24 2153   03/22/24 1630  ceFEPIme  (MAXIPIME ) 2 g in sodium chloride  0.9 % 100 mL IVPB        2 g 200 mL/hr over 30 Minutes Intravenous  Once 03/22/24 1620 03/22/24 1824   03/22/24 1630  metroNIDAZOLE  (FLAGYL ) IVPB 500 mg        500 mg 100 mL/hr over 60 Minutes Intravenous  Once 03/22/24 1620 03/22/24 1824   03/22/24 1630  vancomycin  (VANCOCIN ) IVPB 1000 mg/200 mL premix        1,000 mg 200 mL/hr over 60 Minutes Intravenous  Once 03/22/24 1620 03/22/24 1824         Component Value Date/Time   SDES BLOOD SITE NOT SPECIFIED 03/22/2024 1646   SPECREQUEST  03/22/2024 1646    BOTTLES DRAWN AEROBIC AND ANAEROBIC Blood Culture adequate volume   CULT  03/22/2024 1646    NO GROWTH < 24 HOURS Performed at [facility], [address], [place], [place] [address]    REPTSTATUS PENDING 03/22/2024 1646    Procedures:    Mennie LAMY, MD [facility] 03/23/2024, 10:53 AM

## 2024-03-23 NOTE — Plan of Care (Signed)
   Problem: Education: Goal: Knowledge of risk factors and measures for prevention of condition will improve Outcome: Progressing   Problem: Coping: Goal: Psychosocial and spiritual needs will be supported Outcome: Progressing   Problem: Respiratory: Goal: Will maintain a patent airway Outcome: Progressing Goal: Complications related to the disease process, condition or treatment will be avoided or minimized Outcome: Progressing

## 2024-03-23 NOTE — Evaluation (Signed)
 Physical Therapy Evaluation Patient Details Name: [name] MRN: [id number] DOB: 04/20/1956 Today's Date: 03/23/2024  History of Present Illness  Pt is a 68 y.o. male admitted 8/22 from HD with fever and tachycardia. COVID test positive. Imaging revealed osteomyelitis R foot. PMH:  ESRD on HD, CHF/HFrEF, HTN, a fib, PAD s/p L BKA and multiple R toe amputations  Clinical Impression  PT eval complete. PTA pt reside in SNF (long term care). He was primarily bedbound. Hoyer transfer to w/c for HD only. On eval, he required max assist rolling R/L. Pt declining OOB. Lunch tray arrived. Assisted with tray set up and bed positioning so pt could eat. Pt is at baseline for mobility with no skilled PT needs. PT signing off.        If plan is discharge home, recommend the following: Two people to help with walking and/or transfers;Two people to help with bathing/dressing/bathroom   Can travel by private vehicle   No    Equipment Recommendations None recommended by PT  Recommendations for Other Services       Functional Status Assessment Patient has not had a recent decline in their functional status     Precautions / Restrictions Precautions Precautions: Fall Recall of Precautions/Restrictions: Intact      Mobility  Bed Mobility Overal bed mobility: Needs Assistance Bed Mobility: Rolling Rolling: Max assist, Used rails         General bed mobility comments: max assist rolling R/L, hoyer lift dependent at baseline    Transfers                   General transfer comment: bedbound at baseline, hoyer transfers to w/c for HD only    Ambulation/Gait               General Gait Details: nonamb at baseline  Stairs            Wheelchair Mobility     Tilt Bed    Modified Rankin (Stroke Patients Only)       Balance                                             Pertinent Vitals/Pain Pain Assessment Pain Assessment: No/denies pain     Home Living Family/patient expects to be discharged to:: Skilled nursing facility                        Prior Function Prior Level of Function : Needs assist             Mobility Comments: primarily bedbound, hoyer transfers to w/c for HD ADLs Comments: Able to feed himself with set up, otherwise total assist-does not make urine, incontinent of bowel in bed, bed baths     Extremity/Trunk Assessment   Upper Extremity Assessment Upper Extremity Assessment: Generalized weakness (wounds/scabs noted bilat hands)    Lower Extremity Assessment Lower Extremity Assessment: RLE deficits/detail;LLE deficits/detail RLE Deficits / Details: R foot wounds,R ankle fixed in plantar flexion, tight hamstrings limiting knee ext LLE Deficits / Details: h/o BKA, knee flexion contracture    Cervical / Trunk Assessment Cervical / Trunk Assessment: Kyphotic  Communication   Communication Communication: No apparent difficulties    Cognition Arousal: Alert Behavior During Therapy: WFL for tasks assessed/performed   PT - Cognitive impairments: No apparent impairments  Following commands: Intact       Cueing Cueing Techniques: Verbal cues     General Comments      Exercises     Assessment/Plan    PT Assessment Patient does not need any further PT services  PT Problem List         PT Treatment Interventions      PT Goals (Current goals can be found in the Care Plan section)  Acute Rehab PT Goals Patient Stated Goal: not stated PT Goal Formulation: All assessment and education complete, DC therapy    Frequency       Co-evaluation               AM-PAC PT 6 Clicks Mobility  Outcome Measure Help needed turning from your back to your side while in a flat bed without using bedrails?: Total Help needed moving from lying on your back to sitting on the side of a flat bed without using bedrails?: Total Help needed moving to  and from a bed to a chair (including a wheelchair)?: Total Help needed standing up from a chair using your arms (e.g., wheelchair or bedside chair)?: Total Help needed to walk in hospital room?: Total Help needed climbing 3-5 steps with a railing? : Total 6 Click Score: 6    End of Session   Activity Tolerance: Patient tolerated treatment well Patient left: in bed;with call bell/phone within reach;with bed alarm set Nurse Communication: Mobility status;Need for lift equipment PT Visit Diagnosis: Other abnormalities of gait and mobility (R26.89)    Time: 8858-8841 PT Time Calculation (min) (ACUTE ONLY): 17 min   Charges:   PT Evaluation $PT Eval Low Complexity: 1 Low   PT General Charges $$ ACUTE PT VISIT: 1 Visit         Sari MATSU., PT  Office # [phone]   [name] 03/23/2024, 1:12 PM

## 2024-03-23 NOTE — Plan of Care (Signed)
  Problem: Coping: Goal: Psychosocial and spiritual needs will be supported Outcome: Progressing   Problem: Respiratory: Goal: Will maintain a patent airway Outcome: Progressing Goal: Complications related to the disease process, condition or treatment will be avoided or minimized Outcome: Progressing   

## 2024-03-23 NOTE — Consult Note (Signed)
 PODIATRY CONSULTATION  NAME [name] MRN [id number] DOB [birth year] DOA 03/22/2024   Reason for consult:  Chief Complaint  Patient presents with   Wound Infection   Abscess    Consulting physician:   History of present illness: 68 y.o. male   Past Medical History:  Diagnosis Date   CHF (congestive heart failure) (HCC)    Diabetes mellitus without complication (HCC)    PATIENT JUST LEARNED HE WAS DIABETIC   ESRD on hemodialysis (HCC)    TTS at [facility]   Hepatitis    Hyperlipemia    Hypertension    Necrotizing fasciitis (HCC)    Pneumonia    HX OF PNA       Latest Ref Rng & Units 03/23/2024    5:57 AM 03/22/2024    4:46 PM 08/29/2023    6:08 AM  CBC  WBC 4.0 - 10.5 K/uL 9.9  12.1  9.0   Hemoglobin 13.0 - 17.0 g/dL 9.0  9.4  7.6   Hematocrit 39.0 - 52.0 % 29.0  31.0  24.8   Platelets 150 - 400 K/uL 148  161  131        Latest Ref Rng & Units 03/23/2024    5:57 AM 03/22/2024    4:46 PM 08/29/2023    6:08 AM  BMP  Glucose 70 - 99 mg/dL 881  837  879   BUN 8 - 23 mg/dL 24  13  57   Creatinine 0.61 - 1.24 mg/dL 7.17  8.10  4.73   Sodium 135 - 145 mmol/L 134  136  137   Potassium 3.5 - 5.1 mmol/L 4.1  3.8  4.8   Chloride 98 - 111 mmol/L 98  91  98   CO2 22 - 32 mmol/L 28  32  24   Calcium  8.9 - 10.3 mg/dL 8.2  8.0  8.0       RT foot 03/23/2024   Physical Exam: General: The patient is alert and oriented x3 in no acute distress.   Dermatology: Skin is cool, dry and supple RLE.  Superficial pressure ulcers noted to the lateral aspect of the right forefoot as well as the lateral heel.  They appear very superficial and stable with a well adhered to fibrotic wound base.  No malodor.  Scant serous drainage noted  Vascular: VAS US  LOWER EXTREMITY ARTERIAL DUPLEX 11/16/2022 +-----------+--------+-----+--------+----------+--------+  RIGHT     PSV cm/sRatioStenosisWaveform  Comments  +-----------+--------+-----+--------+----------+--------+  CFA Prox    76                   biphasic            +-----------+--------+-----+--------+----------+--------+  CFA Distal 72                   biphasic            +-----------+--------+-----+--------+----------+--------+  DFA       75                   biphasic            +-----------+--------+-----+--------+----------+--------+  SFA Prox   49                   biphasic            +-----------+--------+-----+--------+----------+--------+  SFA Mid    34                   biphasic            +-----------+--------+-----+--------+----------+--------+  SFA Distal 16                   biphasic            +-----------+--------+-----+--------+----------+--------+  POP Prox   33                   biphasic            +-----------+--------+-----+--------+----------+--------+  POP Distal 15                   monophasic          +-----------+--------+-----+--------+----------+--------+  TP Trunk   25                   monophasic          +-----------+--------+-----+--------+----------+--------+  ATA Prox   15                   monophasic          +-----------+--------+-----+--------+----------+--------+  ATA Mid    17                   monophasic          +-----------+--------+-----+--------+----------+--------+  ATA Distal 16                   monophasic          +-----------+--------+-----+--------+----------+--------+  PTA Prox   0            occluded                    +-----------+--------+-----+--------+----------+--------+  PTA Mid    13                   monophasic          +-----------+--------+-----+--------+----------+--------+  PTA Distal 23                   monophasic          +-----------+--------+-----+--------+----------+--------+  PERO Prox  11                   monophasic          +-----------+--------+-----+--------+----------+--------+  PERO Mid   15                    monophasic          +-----------+--------+-----+--------+----------+--------+  PERO Distal18                   monophasic          +-----------+--------+-----+--------+----------+--------+  Summary:  Right: Calcific atherosclerosis throughout the right lower extremity  arteries. Segmental occlusion of the posterior tibial artery. Unable to  rule out additional tibial vessel disease due to heavily calcified vessels  with areas of shadowing.    Neurological: Light touch and protective threshold diminished  Musculoskeletal Exam: h/o BKA LLE.  Multiple toe amputations RT foot.    ASSESSMENT/PLAN OF CARE Superficial pressure ulcer RT heel Superficial pressure ulcer RT lateral forefoot  -Patient evaluated -Recommend conservative treatment management for now -Order placed for nurse wound care consult for dressing recs -Offload heel w/ prevalon boot; ordered -Rec outpatient f/u at [facility]  Thank you for the consult.  Please contact me directly via secure chat with any questions or concerns.     Thresa EMERSON Sar, DPM [facility]  Dr. Thresa EMERSON Sar, DPM  [address] [place], [place] [address]                Office [phone]  Fax [phone]

## 2024-03-23 NOTE — Hospital Course (Addendum)
 3 YOM w/ PMH of ESRD on HD MW, HFrEF, PVD s/p left BKA and multiple right toe amputations admitted in the past for necrotizing fasciitis patient presented to the ED room after dialysis center reported fever and tachycardia during dialysis. Patient had reported coughing episodes but denied any shortness of breath.He was brought in to be evaluated for and worked up for his fever.  In ZI:uzfezmjulmz was noted to be 101 hr 110.  BP was 118/72 and oxygen saturation of 94% on room air. He tested positive for COVID-19.  Chest x-ray no active disease.  Labs with stable potassium bicarb creatinine 1.8 CBC mild leukocytosis chronic anemia.X-ray right foot questionable cortical erosion destruction of the fourth digit phalanges Limited evaluation due to overlapping osseous structures and soft tissue.  Subjective: Seen and examined today In comfortably, no new complaints.  Overnight afebrile BP stable Labs with bump in creatinine 4.05 on ESRD  Assessment and plan:  Fever COVID-19 infection: Respiratory status stable asymptomatic largely from COVID infection.  Continue supportive care  S/P impression of the right 1st and 2nd digit and fifth ray Foot infection/osteomyelitis of 4th toe rt: Patient presented with fever tachycardia from dialysis and tested positive for COVID.  X-ray however unremarkable Patient has a right foot ulcer on right small toe and heel-x-ray with some cortical erosion of the fourth digit phalanges-ESR elevated 68 MRI foot>showed ulceration of the lateral fourth toe with osteomyelitis of the underlying fourth toe proximal middle and distal phalanges  Chronic appearing ulceration along the medial heel. Moderate arthritic changes of the fourth TMT joint. Hypertrophic osseous changes of the mid to distal fourth metatarsal, may reflect sequela of prior trauma or infection. Seen by Dr. Janit from podiatry advised conservative management-will consult ID Continue empiric IV antibiotics for  now  ESRD on HD MWF Got HD on Friday next due on Monday.  Consult nephrology.  Chronic HFpEF  Compensated continue home meds   A-fib : Rate controlled continue metoprolol  and Eliquis   Hypertension: Stable on metoprolol   Anemia of chronic kidney disease: Hb stable. Cont  to monitor  Severe malnutrition with BMI Body mass index is 17.71 kg/m.: Augment nutritional status   Mobility: PT Orders: Active PT Follow up Rec: Long-Term Institutional Care Without Follow-Up Therapy8/23/2025 1307   DVT prophylaxis: apixaban  (ELIQUIS ) tablet 2.5 mg Start: 03/22/24 2200 SCDs Start: 03/22/24 2010 Code Status:   Code Status: Full Code Family Communication: plan of care discussed with patient at bedside. Patient status is: Remains hospitalized because of severity of illness Level of care: Med-Surg   Dispo: The patient is from: Nursing home            Anticipated disposition: Back to facility in 24 hours   Objective: Vitals last 24 hrs: Vitals:   03/23/24 1628 03/23/24 1921 03/24/24 0425 03/24/24 0822  BP: (!) 146/57 (!) 137/50 (!) 133/53 120/87  Pulse: 80 81 76 83  Resp: 17 18 18 18   Temp: 98.9 F (37.2 C) 98.7 F (37.1 C) 98.2 F (36.8 C) 98.6 F (37 C)  TempSrc: Oral Oral Oral Oral  SpO2: 99% 98% 98% 96%  Weight:      Height:       Physical Examination: General exam: Alert awake oriented.   HEENT:Oral mucosa moist, Ear/Nose WNL grossly Respiratory system: Bilaterally clear BS,no use of accessory muscle Cardiovascular system: S1 & S2 +, No JVD. Gastrointestinal system: Abdomen soft,NT,ND, BS+ Nervous System: Alert, awake, moving all extremities,and following commands. Extremities: LE edema neg, distal extremities warm.  Right forefoot with wound and on the lateral heel Skin: No rashes,no icterus. MSK: Normal muscle bulk,tone, power   Medications reviewed:  Scheduled Meds:  amiodarone   200 mg Oral Daily   apixaban   2.5 mg Oral BID   atorvastatin   20 mg Oral Daily    bethanechol   5 mg Oral TID   calcitRIOL   0.25 mcg Oral Q T,Th,Sa-HD   cyanocobalamin   1,000 mcg Oral Daily   ferric citrate   210 mg Oral TID WC   ferrous sulfate   325 mg Oral Q breakfast   folic acid   1 mg Oral Daily   [START ON 03/25/2024] metoprolol  tartrate  12.5 mg Oral Once per day on Monday Wednesday Friday   pantoprazole   40 mg Oral BID AC   [START ON 03/25/2024] tamsulosin   0.4 mg Oral Q M,W,F   Continuous Infusions:  ceFEPime  (MAXIPIME ) IV 1 g (03/23/24 1300)   Diet: Diet Order             Diet renal with fluid restriction Fluid restriction: 1200 mL Fluid; Room service appropriate? Yes; Fluid consistency: Thin  Diet effective now

## 2024-03-23 NOTE — Progress Notes (Signed)
 OT Cancellation Note  Patient Details Name: [name] MRN: [id number] DOB: 11/11/1955   Cancelled Treatment:    Reason Eval/Treat Not Completed: OT screened, no needs identified, will sign off.  Per discussion with PT patient is at functional baseline for OT at this time.  He resides in long term care SNF and required max-total assist for bathing and dressing tasks at bed level.  [name] was used for OOB transfers to wheelchair.  Pt was able to self feed with setup assist and is currently at that functional level.  No further OT needs will defer OT eval at this time based on patient being as baseline.   [name] OTR/L 03/23/2024, 2:11 PM

## 2024-03-23 NOTE — Consult Note (Signed)
 WOC Nurse Consult Note: Reason for Consult:  Right lateral forefoot; and heel Left BKA   Wound type: neuropathic foot ulcerations right lateral foot Right heel; unstageable pressure injuries left heel Pressure injury: Stage 3; full thickness; (medical device-prothesis)  Pressure Injury POA: Yes Measurement: see nursing flow sheets Wound bed: Right heel: 100% necrotic; black Right lateral foot: 100% necrotic black Left stump; 100% pink Drainage (amount, consistency, odor) see nursing flow sheets Periwound: intact  Dressing procedure/placement/frequency: Paint right foot wounds with betadine, allow to air dry Foam to the left stump wound; change every other 3 days     Re consult if needed, will not follow at this time. Thanks  Evon [employer]perez [employer], RN,CWOCN, CNS, [employer] [phone]

## 2024-03-24 DIAGNOSIS — M86671 Other chronic osteomyelitis, right ankle and foot: Secondary | ICD-10-CM

## 2024-03-24 DIAGNOSIS — L97516 Non-pressure chronic ulcer of other part of right foot with bone involvement without evidence of necrosis: Secondary | ICD-10-CM

## 2024-03-24 DIAGNOSIS — U071 COVID-19: Secondary | ICD-10-CM | POA: Diagnosis not present

## 2024-03-24 DIAGNOSIS — L89619 Pressure ulcer of right heel, unspecified stage: Secondary | ICD-10-CM

## 2024-03-24 LAB — BASIC METABOLIC PANEL WITH GFR
Anion gap: 14 (ref 5–15)
BUN: 37 mg/dL — ABNORMAL HIGH (ref 8–23)
CO2: 26 mmol/L (ref 22–32)
Calcium: 8.6 mg/dL — ABNORMAL LOW (ref 8.9–10.3)
Chloride: 98 mmol/L (ref 98–111)
Creatinine, Ser: 4.03 mg/dL — ABNORMAL HIGH (ref 0.61–1.24)
GFR, Estimated: 16 mL/min — ABNORMAL LOW (ref >60)
Glucose, Bld: 93 mg/dL (ref 70–99)
Potassium: 3.9 mmol/L (ref 3.5–5.1)
Sodium: 138 mmol/L (ref 135–145)

## 2024-03-24 LAB — CBC
HCT: 30.5 % — ABNORMAL LOW (ref 39.0–52.0)
Hemoglobin: 9.7 g/dL — ABNORMAL LOW (ref 13.0–17.0)
MCH: 31.8 pg (ref 26.0–34.0)
MCHC: 31.8 g/dL (ref 30.0–36.0)
MCV: 100 fL (ref 80.0–100.0)
Platelets: 148 K/uL — ABNORMAL LOW (ref 150–400)
RBC: 3.05 MIL/uL — ABNORMAL LOW (ref 4.22–5.81)
RDW: 16.8 % — ABNORMAL HIGH (ref 11.5–15.5)
WBC: 9.4 K/uL (ref 4.0–10.5)
nRBC: 0 % (ref 0.0–0.2)

## 2024-03-24 LAB — HEPATITIS B SURFACE ANTIGEN: Hepatitis B Surface Ag: NONREACTIVE

## 2024-03-24 MED ORDER — CHLORHEXIDINE GLUCONATE CLOTH 2 % EX PADS
6.0000 | MEDICATED_PAD | Freq: Every day | CUTANEOUS | Status: DC
  Administered 2024-03-25: 6 via TOPICAL

## 2024-03-24 NOTE — TOC Initial Note (Addendum)
 Transition of Care [facility]) - Initial/Assessment Note    Patient Details  Name: [name] MRN: [id number] Date of Birth: 09/01/1955  [facility] CM/SW Contact:    Gwenn Julien [place], [place] Phone Number: 03/24/2024, 4:40 PM  Clinical Narrative:  Pt admitted from [facility] where he is a LTC resident. Pt positive for COVID on admission. Message left for [facility] in [facility] admissions requesting confirmation of pt's status. Per MD, pt likely ready for dc Monday. Will f/u with Kathlean Milian again tomorrow.   UPDATE 1730: Spoke to [employer] with [employer] [facility] who confirmed pt is LTC and able to return. [facility] aware of COVID + result on admission and that EDD is Monday.   Julien Gwenn, MSW, LCSW [phone] (coverage)                   Expected Discharge Plan: Skilled Nursing Facility Barriers to Discharge: Continued Medical Work up   Patient Goals and CMS Choice            Expected Discharge Plan and Services     Post Acute Care Choice: Skilled Nursing Facility Living arrangements for the past 2 months: Skilled Nursing Facility                                      Prior Living Arrangements/Services Living arrangements for the past 2 months: Skilled Nursing Facility Lives with:: Facility Resident          Need for Family Participation in Patient Care: Yes (Comment) Care giver support system in place?: Yes (comment)   Criminal Activity/Legal Involvement Pertinent to Current Situation/Hospitalization: Yes - Comment as needed  Activities of Daily Living   ADL Screening (condition at time of admission) Independently performs ADLs?: No Does the patient have a NEW difficulty with bathing/dressing/toileting/self-feeding that is expected to last >3 days?: Yes (Initiates electronic notice to provider for possible OT consult) Does the patient have a NEW difficulty with getting in/out of bed, walking, or climbing stairs that is expected to last >3 days?:  No Does the patient have a NEW difficulty with communication that is expected to last >3 days?: No Is the patient deaf or have difficulty hearing?: No Does the patient have difficulty seeing, even when wearing glasses/contacts?: No Does the patient have difficulty concentrating, remembering, or making decisions?: No  Permission Sought/Granted                  Emotional Assessment       Orientation: : Oriented to Self, Oriented to Place, Oriented to  Time, Oriented to Situation Alcohol / Substance Use: Not Applicable Psych Involvement: No (comment)  Admission diagnosis:  ESRD on dialysis (HCC) [N18.6, Z99.2] Ulcer of right foot, unspecified ulcer stage (HCC) [L97.519] Sepsis, due to unspecified organism, unspecified whether acute organ dysfunction present (HCC) [A41.9] COVID-19 [U07.1] Patient Active Problem List   Diagnosis Date Noted   Pressure injury of ankle, stage 2 (HCC) 08/28/2023   Septic shock (HCC) 08/24/2023   Encephalopathy acute 08/24/2023   Lactic acidosis 08/24/2023   Melena 07/21/2023   Heme positive stool 07/19/2023   Acute gastritis with hemorrhage 07/19/2023   Gastric and duodenal angiodysplasia with hemorrhage 07/19/2023   ABLA (acute blood loss anemia) 07/17/2023   GIB (gastrointestinal bleeding) 07/17/2023   NSTEMI (non-ST elevated myocardial infarction) (HCC) 10/05/2022   Ileus (HCC) 09/30/2022   Urinary retention 09/30/2022   Paralytic  ileus (HCC) 09/30/2022   ESRD (end stage renal disease) (HCC) 09/30/2022   Acute on chronic systolic congestive heart failure (HCC) 09/30/2022   Hypotension 09/29/2022   Atrial fibrillation (HCC) 09/29/2022   DVT (deep venous thrombosis) (HCC) 09/24/2022   Hospital-acquired bacterial pneumonia 09/24/2022   Acute hypoxic respiratory failure (HCC) 09/23/2022   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   Fluid overload, unspecified 11/06/2020   COVID-19 08/10/2020   Iron  deficiency anemia,  unspecified 01/10/2020   Abnormality of albumin  12/20/2019   Encounter for immunization 12/17/2019   Anaphylactic shock, unspecified, initial encounter 12/16/2019   Cardiomyopathy, unspecified (HCC) 12/16/2019   Heart failure, unspecified (HCC) 12/16/2019   Hyperparathyroidism, unspecified (HCC) 12/16/2019   Metabolic disorder, unspecified 12/16/2019   Allergy, unspecified, initial encounter 12/16/2019   Other specified coagulation defects (HCC) 12/16/2019   Pain, unspecified 12/16/2019   Acute systolic CHF (congestive heart failure) (HCC)    S/P dialysis catheter insertion (HCC)    ESRD on dialysis (HCC) 12/12/2019   PAD (peripheral artery disease) (HCC) 12/12/2019   Pressure injury of skin 12/01/2019   Anemia 11/29/2019   Uncontrolled hypertension 11/29/2019   CKD (chronic kidney disease), stage V (HCC) 11/29/2019   Severe protein-calorie malnutrition (HCC)    Diabetic polyneuropathy associated with type 2 diabetes mellitus (HCC)    Venous insufficiency of right lower extremity    Pneumonia    Renal failure 11/28/2019   Toe gangrene (HCC) 12/18/2013   Gangrene of toe (HCC) 09/16/2013   Fever with chills 09/16/2013   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/2013   DKA (diabetic ketoacidosis) (HCC) 09/04/2013   Type 2 diabetes mellitus, controlled (HCC) 09/02/2013   DM (diabetes mellitus) type II controlled, neurological manifestation (HCC) 09/01/2013   PCP:  Patient, No Pcp Per Pharmacy:   [facility] [address] [place] [address] Phone: [phone] Fax: [phone]  [facility] - [place], [place] - [address] [place] [address] Phone: [phone] Fax: [phone]     Social Drivers of Health (SDOH) Social History: SDOH Screenings   Food Insecurity: Patient Declined (03/23/2024)  Housing: Patient Declined (03/23/2024)  Transportation Needs: Patient Unable To Answer (08/25/2023)  Utilities:  Patient Unable To Answer (08/25/2023)  Social Connections: Patient Unable To Answer (08/25/2023)  Tobacco Use: Low Risk  (03/22/2024)   SDOH Interventions:     Readmission Risk Interventions     No data to display

## 2024-03-24 NOTE — Consult Note (Signed)
 Renal Service Consult Note [facility] Lamar JONETTA Fret, MD  Patient: [name] Date: 03/24/2024 Requesting Physician: Dr. Christobal  Reason for Consult: ESRD pt w/ fevers HPI: The patient is a 68 y.o. year-old w/ PMH as below who presented to ED complaining of fever while on dialysis on Friday.  Temperature was up to 101.  Blood pressure 157/59, HR 124, temp 101 in the ED.  K+ 3.8, BUN 13, creatinine 1.8 in the ED.  Also, patient tested positive for COVID and he has a right foot ulcer which may be infected.  Xrays were done on the right foot with concerns for osteomyelitis.  Patient was started on IV antibiotics for possible foot infection.  The COVID pit will be managed conservatively.  Had full dialysis on Friday.  Next dialysis due on Monday.  We are asked to see for ESRD.  Patient has history of ESRD on HD, HFrEF, PAD s/p L BKA, right toe amputations and history of necrotizing fasciitis.  Pt seen in room. Eating his lunch. No c/o's. Denies ongoing fevers, SOB, leg swelling or CP.    ROS - denies CP, no joint pain, no HA, no blurry vision, no rash, no diarrhea, no nausea/ vomiting   Past Medical History  Past Medical History:  Diagnosis Date   CHF (congestive heart failure) (HCC)    Diabetes mellitus without complication (HCC)    PATIENT JUST LEARNED HE WAS DIABETIC   ESRD on hemodialysis (HCC)    TTS at [facility]   Hepatitis    Hyperlipemia    Hypertension    Necrotizing fasciitis (HCC)    Pneumonia    HX OF PNA   Past Surgical History  Past Surgical History:  Procedure Laterality Date   A/V FISTULAGRAM N/A 08/16/2023   Procedure: A/V Fistulagram;  Surgeon: Melia Lynwood ORN, MD;  Location: [facility] INVASIVE CV LAB;  Service: Cardiovascular;  Laterality: N/A;   AMPUTATION Left 09/11/2013   Procedure: AMPUTATION BELOW KNEE;  Surgeon: Kay Ozell Cummins, MD;  Location: [facility] OR;  Service: Orthopedics;  Laterality: Left;   AMPUTATION Right 12/18/2013   Procedure: RIGHT FOOT  1,2, TOE AMPUTATION  5th toe RAY AMPUTATION;  Surgeon: Kay Ozell Cummins, MD;  Location: [facility] OR;  Service: Orthopedics;  Laterality: Right;   AV FISTULA PLACEMENT Left 12/11/2019   Procedure: LEFT BRACHIOCEPHALIC FISTULA CREATION;  Surgeon: Oris Krystal FALCON, MD;  Location: [facility] OR;  Service: Vascular;  Laterality: Left;   BIOPSY  07/19/2023   Procedure: BIOPSY;  Surgeon: Albertus Gordy HERO, MD;  Location: [facility] ENDOSCOPY;  Service: Gastroenterology;;   CHOLECYSTECTOMY     ESOPHAGOGASTRODUODENOSCOPY (EGD) WITH PROPOFOL  N/A 07/19/2023   Procedure: ESOPHAGOGASTRODUODENOSCOPY (EGD) WITH PROPOFOL ;  Surgeon: Albertus Gordy HERO, MD;  Location: [facility] ENDOSCOPY;  Service: Gastroenterology;  Laterality: N/A;   HOT HEMOSTASIS N/A 07/19/2023   Procedure: HOT HEMOSTASIS (ARGON PLASMA COAGULATION/BICAP);  Surgeon: Albertus Gordy HERO, MD;  Location: [facility] ENDOSCOPY;  Service: Gastroenterology;  Laterality: N/A;  stomach   I & D EXTREMITY Left 09/03/2013   Procedure: IRRIGATION AND DEBRIDEMENT EXTREMITY;  Surgeon: Kay Ozell Cummins, MD;  Location: [facility] OR;  Service: Orthopedics;  Laterality: Left;   I & D EXTREMITY Left 09/11/2013   Procedure: LEFT FOOT IRRIGATION AND DEBRIDEMENT;  Surgeon: Kay Ozell Cummins, MD;  Location: [facility] OR;  Service: Orthopedics;  Laterality: Left;   INSERTION OF DIALYSIS CATHETER  12/11/2019   Procedure: Insertion Of Dialysis Catheter;  Surgeon: Oris Krystal FALCON, MD;  Location: [facility] OR;  Service:  Vascular;;   INSERTION OF DIALYSIS CATHETER Right 07/11/2022   Procedure: INSERTION OF TUNNELED DIALYSIS CATHETER;  Surgeon: Eliza Lonni RAMAN, MD;  Location: [facility] OR;  Service: Vascular;  Laterality: Right;   LEFT HEART CATH AND CORONARY ANGIOGRAPHY N/A 12/13/2019   Procedure: LEFT HEART CATH AND CORONARY ANGIOGRAPHY;  Surgeon: Dann Candyce RAMAN, MD;  Location: [facility] INVASIVE CV LAB;  Service: Cardiovascular;  Laterality: N/A;   PERIPHERAL VASCULAR BALLOON ANGIOPLASTY Left 08/16/2023   Procedure: PERIPHERAL VASCULAR BALLOON  ANGIOPLASTY;  Surgeon: Melia Lynwood ORN, MD;  Location: [facility] INVASIVE CV LAB;  Service: Cardiovascular;  Laterality: Left;   REVISON OF ARTERIOVENOUS FISTULA Left 07/11/2022   Procedure: REVISON OF LEFT ARM ARTERIOVENOUS FISTULA;  Surgeon: Eliza Lonni RAMAN, MD;  Location: [facility] OR;  Service: Vascular;  Laterality: Left;   RIGHT HEART CATH N/A 12/13/2019   Procedure: RIGHT HEART CATH;  Surgeon: Dann Candyce RAMAN, MD;  Location: [facility] INVASIVE CV LAB;  Service: Cardiovascular;  Laterality: N/A;   Family History  Family History  Problem Relation Age of Onset   Diabetes type II Mother    Dementia Mother    Heart disease Father    Social History  reports that he has never smoked. He has never used smokeless tobacco. He reports that he does not drink alcohol and does not use drugs. Allergies  Allergies  Allergen Reactions   Ultram  [Tramadol ] Nausea And Vomiting   Home medications Prior to Admission medications   Medication Sig Start Date End Date Taking? Authorizing Provider  Amino Acids-Protein Hydrolys (PRO-STAT) LIQD Take 30 mLs by mouth 2 (two) times daily.   Yes [provider]  amiodarone  (PACERONE ) 200 MG tablet Take 1 tablet (200 mg total) by mouth daily. 10/15/22 07/31/24 Yes McDiarmid, Krystal BIRCH, MD  apixaban  (ELIQUIS ) 2.5 MG TABS tablet Take 1 tablet (2.5 mg total) by mouth 2 (two) times daily. 07/21/23  Yes Caleen Qualia, MD  atorvastatin  (LIPITOR) 20 MG tablet Take 1 tablet (20 mg total) by mouth daily. Patient taking differently: Take 20 mg by mouth at bedtime. 12/17/19  Yes Christian, Rylee, MD  bethanechol  (URECHOLINE ) 5 MG tablet Take 5 mg by mouth 3 (three) times daily. 01/23/24  Yes [provider]  clotrimazole -betamethasone (LOTRISONE) cream Apply 1 Application topically See admin instructions. Apply topically to rash every day shift.   Yes [provider]  cyanocobalamin  1000 MCG tablet Take 1,000 mcg by mouth daily.   Yes [provider]   ferric citrate  (AURYXIA ) 1 GM 210 MG(Fe) tablet Take 210 mg by mouth with breakfast, with lunch, and with evening meal. 07/29/23  Yes [provider]  folic acid  (FOLVITE ) 1 MG tablet Take 1 mg by mouth daily.   Yes [provider]  guaiFENesin  (ROBITUSSIN) 100 MG/5ML liquid Take 5 mLs by mouth every 6 (six) hours as needed for cough or to loosen phlegm.   Yes [provider]  hydrocortisone  cream 1 % Apply 1 Application topically daily as needed (rash).   Yes [provider]  liver oil-zinc oxide (DESITIN) 40 % ointment Apply 1 Application topically in the morning and at bedtime. Apply topically to buttocks and sacrum area every day and night shift.   Yes [provider]  metoprolol  tartrate (LOPRESSOR ) 25 MG tablet Take 12.5 mg by mouth See admin instructions. Give 1/2 tablet (12.5mg ) by mouth three days a week on Tuesday, Thursday, Saturday. 01/23/24  Yes [provider]  OXYGEN Inhale 2 L/min into the lungs as needed (shortness  of breath or maintain sats > 90%).   Yes [provider]  pantoprazole  (PROTONIX ) 40 MG tablet Take 1 tablet (40 mg total) by mouth 2 (two) times daily before a meal. 07/21/23  Yes Caleen Qualia, MD  promethazine  (PHENERGAN ) 25 MG/ML injection Inject 25 mg into the muscle every 4 (four) hours as needed for nausea or vomiting.   Yes [provider]  tamsulosin  (FLOMAX ) 0.4 MG CAPS capsule Take 0.4 mg by mouth See admin instructions. Take one tablet by mouth 3 times a week on Monday, Wednesday and Fridays 12/20/22  Yes [provider]     Vitals:   03/23/24 1628 03/23/24 1921 03/24/24 0425 03/24/24 0822  BP: (!) 146/57 (!) 137/50 (!) 133/53 120/87  Pulse: 80 81 76 83  Resp: 17 18 18 18   Temp: 98.9 F (37.2 C) 98.7 F (37.1 C) 98.2 F (36.8 C) 98.6 F (37 C)  TempSrc: Oral Oral Oral Oral  SpO2: 99% 98% 98% 96%  Weight:      Height:       Exam Gen disheveled pleasant, small-framed  elderly male No rash, cyanosis or gangrene Sclera anicteric, throat clear  No jvd or bruits Chest clear bilat to bases, no rales/ wheezing RRR no MRG Abd soft ntnd no mass or ascites +bs GU nl male MS no joint effusions Ext no LE or UE edema Neuro is alert, Ox 3 , nf    LUA AVF+bruit   Home bp meds: Metoprolol  12.5 three times per week Home O2 2 L/min   OP HD: MWF G-O 3h  B400   51.5kg   2K bath  AVF   Heparin  none Good compliance, low wt gains, usually gets within 1-2 kg over Last OP HD 8/22, post wt 52.8kg Mircera 100 q 2, last 8/20, due 9/05  Assessment/ Plan: Fever: likely due to COVID infection, other possible source is the R foot, concern for osteomyelitis by imaging. ID consulting. IV abx started per primary service for the latter.  ESRD: on HD MWF. Last HD Friday. Next HD Monday.  HTN: bp's 140/55 here, on metoprolol  12.5 once per day on HD days.  Volume: looks euvolemic on exam, usually comes off close to dry wt at OP unit.  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 Follow, transfuse prn.         Myer Fret  MD [facility] 03/24/2024, 11:56 AM  Recent Labs  Lab 03/22/24 1646 03/23/24 0557 03/24/24 0443  HGB 9.4* 9.0* 9.7*  ALBUMIN  2.7*  --   --   CALCIUM  8.0* 8.2* 8.6*  CREATININE 1.89* 2.82* 4.03*  K 3.8 4.1 3.9   Inpatient medications:  amiodarone   200 mg Oral Daily   apixaban   2.5 mg Oral BID   atorvastatin   20 mg Oral Daily   bethanechol   5 mg Oral TID   calcitRIOL   0.25 mcg Oral Q T,Th,Sa-HD   cyanocobalamin   1,000 mcg Oral Daily   ferric citrate   210 mg Oral TID WC   ferrous sulfate   325 mg Oral Q breakfast   folic acid   1 mg Oral Daily   [START ON 03/25/2024] metoprolol  tartrate  12.5 mg Oral Once per day on Monday Wednesday Friday   pantoprazole   40 mg Oral BID AC   [START ON 03/25/2024] tamsulosin   0.4 mg Oral Q M,W,F    ceFEPime  (MAXIPIME ) IV 1 g (03/23/24 1300)   acetaminophen  **OR** acetaminophen , albuterol , clotrimazole ,  hydrALAZINE , promethazine 

## 2024-03-24 NOTE — Consult Note (Addendum)
 [facility]                                                                                        Patient Identification: Patient Name: [name] MRN: [id number] Admit Date: 03/22/2024  3:36 PM Today's Date: 03/24/2024 Reason for consult: Osteomyelitis Requesting provider: Dr. CHRISTOBAL  Principal Problem:   COVID-19   Antibiotics:  Vancomycin  8/22 Cefepime  8/22-8/23 Metronidazole  8/22 Unasyn  8/22  Lines/Hardware:  Assessment # Rt lateral foot wound with possible RT 4th toe osteomyelitis  # RT heel wound  - no intervention per podiatry   # Left 1st and 2nd digit webspace wound - no signs of infection   # COVID  - Fevers likely due to covid and resolved - No respiratory symptoms - Per primary team  Recommendations  - continue Vancomycin  and cefepime  with HD for 6 weeks.  Metronidazole  500 mg twice daily for 7 days total.  - Continue optimal wound care - Patient has a fu appt with [facility] on 9/23 at 11 am  - Airborne/contact precautions ID will sign off.  Recall back with questions or concerns  Addendum 8: 07 pm: ABI ordered. May need Vascular to eval if abnormal.   Rest of the management as per the primary team. Please call with questions or concerns.  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  No reported shortness of breath or chest pain, GI or GU symptoms.   At ED febrile, tachycardic Labs remarkable for albumin  2.7, lactic acid 2.0, WBC 12.1, hemoglobin 9.4, sed rate 68 SARS-CoV-2 positive.  Influenza A/influenza  B/RSV negative HIV nonreactive 8/22 blood cx 2/2 sets NGTD  MRSA PCR negative  CXR IMPRESSION: No active disease.  Xray rt foot  Question cortical erosion destruction of the fourth digit phalanges. Limited evaluation due to overlapping osseous structures and overlying soft tissues. If high clinical concern, please consider MRI for further evaluation (with intravenous contrast if GFR greater than 30).  MRI rt foot IMPRESSION: 1. Ulceration at the lateral fourth toe with osteomyelitis of the underlying fourth toe proximal, middle, and distal phalanges. No abscess. 2. Chronic appearing ulceration along the medial heel. 3. Moderate arthritic changes of the fourth TMT joint. Hypertrophic osseous changes of the mid to distal fourth metatarsal, may reflect sequela of prior trauma or infection. 4. Status post amputation of the first and second digit phalanges and the fifth ray at the level of the mid fifth metatarsal.  Was given IVF and started on broad-spectrum antibiotics  ROS: General- Denies fever, chills, loss of appetite and loss of weight HEENT - Denies headache, blurry vision, neck pain, sinus pain Chest - Denies any chest pain, SOB or cough CVS- Denies any dizziness/lightheadedness, syncopal attacks, palpitations Abdomen- Denies any nausea, vomiting, abdominal pain, hematochezia and diarrhea Neuro - Denies any weakness, numbness, tingling sensation Psych -  Denies any changes in mood irritability or depressive symptoms GU- Denies any burning, dysuria, hematuria or increased frequency of urination MSK - denies any joint pain/swelling or restricted ROM   Past Medical History:  Diagnosis Date   CHF (congestive heart failure) (HCC)    Diabetes mellitus without complication (HCC)    PATIENT JUST LEARNED HE WAS DIABETIC   ESRD on hemodialysis (HCC)    TTS at [facility]   Hepatitis    Hyperlipemia    Hypertension    Necrotizing fasciitis (HCC)    Pneumonia    HX OF PNA     Past Surgical History:  Procedure Laterality Date   A/V FISTULAGRAM N/A 08/16/2023   Procedure: A/V Fistulagram;  Surgeon: Melia Lynwood ORN, MD;  Location: [facility] INVASIVE CV [facility];  Service: Cardiovascular;  Laterality: N/A;   AMPUTATION Left 09/11/2013   Procedure: AMPUTATION BELOW KNEE;  Surgeon: Kay Ozell Cummins, MD;  Location: [facility] OR;  Service: Orthopedics;  Laterality: Left;   AMPUTATION Right 12/18/2013   Procedure: RIGHT FOOT 1,2, TOE AMPUTATION  5th toe RAY AMPUTATION;  Surgeon: Kay Ozell Cummins, MD;  Location: [facility] OR;  Service: Orthopedics;  Laterality: Right;   AV FISTULA PLACEMENT Left 12/11/2019   Procedure: LEFT BRACHIOCEPHALIC FISTULA CREATION;  Surgeon: Oris Krystal FALCON, MD;  Location: [facility] OR;  Service: Vascular;  Laterality: Left;   BIOPSY  07/19/2023   Procedure: BIOPSY;  Surgeon: Albertus Gordy HERO, MD;  Location: [facility] ENDOSCOPY;  Service: Gastroenterology;;   CHOLECYSTECTOMY     ESOPHAGOGASTRODUODENOSCOPY (EGD) WITH PROPOFOL  N/A 07/19/2023   Procedure: ESOPHAGOGASTRODUODENOSCOPY (EGD) WITH PROPOFOL ;  Surgeon: Albertus Gordy HERO, MD;  Location: [facility] ENDOSCOPY;  Service: Gastroenterology;  Laterality: N/A;   HOT HEMOSTASIS N/A 07/19/2023   Procedure: HOT HEMOSTASIS (ARGON PLASMA COAGULATION/BICAP);  Surgeon: Albertus Gordy HERO, MD;  Location: [facility] ENDOSCOPY;  Service: Gastroenterology;  Laterality: N/A;  stomach   I & D EXTREMITY Left 09/03/2013   Procedure: IRRIGATION AND DEBRIDEMENT EXTREMITY;  Surgeon: Kay Ozell Cummins, MD;  Location: [facility] OR;  Service: Orthopedics;  Laterality: Left;   I & D EXTREMITY Left 09/11/2013   Procedure: LEFT FOOT IRRIGATION AND DEBRIDEMENT;  Surgeon: Kay Ozell Cummins, MD;  Location: [facility] OR;  Service: Orthopedics;  Laterality: Left;   INSERTION OF DIALYSIS CATHETER  12/11/2019   Procedure: Insertion Of Dialysis Catheter;  Surgeon: Oris Krystal FALCON, MD;  Location: [facility] OR;  Service: Vascular;;   INSERTION OF DIALYSIS CATHETER Right 07/11/2022   Procedure: INSERTION OF TUNNELED  DIALYSIS CATHETER;  Surgeon: Eliza Lonni RAMAN, MD;  Location: [facility] OR;  Service: Vascular;  Laterality: Right;   LEFT HEART CATH AND CORONARY ANGIOGRAPHY N/A 12/13/2019   Procedure: LEFT HEART CATH AND CORONARY ANGIOGRAPHY;  Surgeon: Dann Candyce RAMAN, MD;  Location: [facility] INVASIVE CV LAB;  Service: Cardiovascular;  Laterality: N/A;   PERIPHERAL VASCULAR BALLOON ANGIOPLASTY Left 08/16/2023   Procedure: PERIPHERAL VASCULAR BALLOON ANGIOPLASTY;  Surgeon: Melia Lynwood ORN, MD;  Location: [facility];  Service: Cardiovascular;  Laterality: Left;   REVISON OF ARTERIOVENOUS FISTULA Left 07/11/2022   Procedure: REVISON OF LEFT ARM ARTERIOVENOUS FISTULA;  Surgeon: Eliza Lonni RAMAN, MD;  Location: [facility] OR;  Service: Vascular;  Laterality: Left;   RIGHT HEART CATH N/A 12/13/2019   Procedure: RIGHT HEART CATH;  Surgeon: Dann Candyce RAMAN, MD;  Location: [facility] INVASIVE CV LAB;  Service: Cardiovascular;  Laterality: N/A;    Scheduled Meds:  amiodarone   200 mg Oral Daily   apixaban   2.5 mg Oral BID  atorvastatin   20 mg Oral Daily   bethanechol   5 mg Oral TID   calcitRIOL   0.25 mcg Oral Q T,Th,Sa-HD   cyanocobalamin   1,000 mcg Oral Daily   ferric citrate   210 mg Oral TID WC   ferrous sulfate   325 mg Oral Q breakfast   folic acid   1 mg Oral Daily   [START ON 03/25/2024] metoprolol  tartrate  12.5 mg Oral Once per day on Monday Wednesday Friday   pantoprazole   40 mg Oral BID AC   [START ON 03/25/2024] tamsulosin   0.4 mg Oral Q M,W,F   Continuous Infusions:  ceFEPime  (MAXIPIME ) IV 1 g (03/23/24 1300)   PRN Meds:.acetaminophen  **OR** acetaminophen , albuterol , clotrimazole , hydrALAZINE , promethazine   Allergies  Allergen Reactions   Ultram  [email or web address] ] Nausea And Vomiting   Social History   Socioeconomic History   Marital status: Single    Spouse name: Not on file   Number of children: Not on file   Years of education: Not on file   Highest education level: Not on file  Occupational History    Not on file  Tobacco Use   Smoking status: Never   Smokeless tobacco: Never  Vaping Use   Vaping status: Never Used  Substance and Sexual Activity   Alcohol use: No   Drug use: No   Sexual activity: Not on file  Other Topics Concern   Not on file  Social History Narrative   Lives in [facility]   Social Drivers of Health   Financial Resource Strain: Not on file  Food Insecurity: Patient Declined (03/23/2024)   Hunger Vital Sign    Worried About Running Out of Food in the Last Year: Patient declined    Ran Out of Food in the Last Year: Patient declined  Transportation Needs: Patient Unable To Answer (08/25/2023)   PRAPARE - Transportation    Lack of Transportation (Medical): Patient unable to answer    Lack of Transportation (Non-Medical): Patient unable to answer  Physical Activity: Not on file  Stress: Not on file  Social Connections: Patient Unable To Answer (08/25/2023)   Social Connection and Isolation Panel    Frequency of Communication with Friends and Family: Patient unable to answer    Frequency of Social Gatherings with Friends and Family: Patient unable to answer    Attends Religious Services: Patient unable to answer    Active Member of Clubs or Organizations: Patient unable to answer    Attends Banker Meetings: Patient unable to answer    Marital Status: Patient unable to answer  Intimate Partner Violence: Patient Declined (03/23/2024)   Humiliation, Afraid, Rape, and Kick questionnaire    Fear of Current or Ex-Partner: Patient declined    Emotionally Abused: Patient declined    Physically Abused: Patient declined    Sexually Abused: Patient declined   Family History  Problem Relation Age of Onset   Diabetes type II Mother    Dementia Mother    Heart disease Father    Vitals BP 120/87 (BP Location: Right Arm)   Pulse 83   Temp 98.6 F (37 C) (Oral)   Resp 18   Ht 5' 3 (1.6 m)   Wt 45.4 kg   SpO2 96%   BMI 17.71 kg/m     Physical Exam Constitutional: Elderly male sitting in the bed, not in acute distress    Comments: HEENT WNL  Cardiovascular:     Rate and Rhythm: Normal rate and regular rhythm.     Heart  sounds: S1 and S2  Pulmonary:     Effort: Pulmonary effort is normal.     Comments: Normal breath sounds  Abdominal:     Palpations: Abdomen is soft.     Tenderness: Nondistended and nontender  Musculoskeletal:        General: No swelling or tenderness peripheral joints  Skin:    Comments: rt lateral foot wound *2 stable with fibrinous base and Rt heel superficial ulcer with no redness, warmth, tenderness, no fluctuance or crepitus. No signs of infection  Multiple toe amputations in the right foot Left BKA stump with superficial wound, no signs of infection  Scabbed wound in the webspace between 1st and 2nd left fingers, no signs of infection   Left AVF OK with no signs of infection   Neurological:     General: Awake, alert and oriented, grossly normal  Psychiatric:        Mood and Affect: Mood normal.    Pertinent Microbiology Results for orders placed or performed during the hospital encounter of 03/22/24  Culture, blood (Routine x 2)     Status: None (Preliminary result)   Collection Time: 03/22/24  3:50 PM   Specimen: BLOOD  Result Value Ref Range Status   Specimen Description BLOOD BLOOD RIGHT HAND  Final   Special Requests   Final    AEROBIC BOTTLE ONLY Blood Culture results may not be optimal due to an inadequate volume of blood received in culture bottles   Culture   Final    NO GROWTH 2 DAYS Performed at [facility], [address] [place], [place] [address]    Report Status PENDING  Incomplete  Culture, blood (Routine x 2)     Status: None (Preliminary result)   Collection Time: 03/22/24  4:46 PM   Specimen: BLOOD  Result Value Ref Range Status   Specimen Description BLOOD SITE NOT SPECIFIED  Final   Special Requests   Final    BOTTLES DRAWN AEROBIC AND  ANAEROBIC Blood Culture adequate volume   Culture   Final    NO GROWTH 2 DAYS Performed at [facility], [address], [place], [place] [address]    Report Status PENDING  Incomplete  Resp panel by RT-PCR (RSV, Flu A&B, Covid) Anterior Nasal Swab     Status: Abnormal   Collection Time: 03/22/24  6:30 PM   Specimen: Anterior Nasal Swab  Result Value Ref Range Status   SARS Coronavirus 2 by RT PCR POSITIVE (A) NEGATIVE Final   Influenza A by PCR NEGATIVE NEGATIVE Final   Influenza B by PCR NEGATIVE NEGATIVE Final    Comment: (NOTE) The Xpert Xpress SARS-CoV-2/FLU/RSV plus assay is intended as an aid in the diagnosis of influenza from Nasopharyngeal swab specimens and should not be used as a sole basis for treatment. Nasal washings and aspirates are unacceptable for Xpert Xpress SARS-CoV-2/FLU/RSV testing.  Fact Sheet for Patients: BloggerCourse.com  Fact Sheet for Healthcare Providers: SeriousBroker.it  This test is not yet approved or cleared by the [place]  FDA and has been authorized for detection and/or diagnosis of SARS-CoV-2 by FDA under an Emergency Use Authorization (EUA). This EUA will remain in effect (meaning this test can be used) for the duration of the COVID-19 declaration under Section 564(b)(1) of the Act, 21 U.S.C. section 360bbb-3(b)(1), unless the authorization is terminated or revoked.     Resp Syncytial Virus by PCR NEGATIVE NEGATIVE Final    Comment: (NOTE) Fact Sheet for Patients: BloggerCourse.com  Fact Sheet for Healthcare Providers: SeriousBroker.it  This test is not yet approved or cleared by the [place]  FDA and has been authorized for detection and/or diagnosis of SARS-CoV-2 by FDA under an Emergency Use Authorization (EUA). This EUA will remain in effect (meaning this test can be used) for the duration of the COVID-19  declaration under Section 564(b)(1) of the Act, 21 U.S.C. section 360bbb-3(b)(1), unless the authorization is terminated or revoked.  Performed at [facility], [address] [place], [place] [address]   MRSA Next Gen by PCR, Nasal     Status: None   Collection Time: 03/23/24  6:53 AM   Specimen: Nasal Mucosa; Nasal Swab  Result Value Ref Range Status   MRSA by PCR Next Gen NOT DETECTED NOT DETECTED Final    Comment: (NOTE) The GeneXpert MRSA Assay (FDA approved for NASAL specimens only), is one component of a comprehensive MRSA colonization surveillance program. It is not intended to diagnose MRSA infection nor to guide or monitor treatment for MRSA infections. Test performance is not FDA approved in patients less than 53 years old. Performed at [facility], [address] [place], [place] [address]    Pertinent Lab seen by me:    Latest Ref Rng & Units 03/24/2024    4:43 AM 03/23/2024    5:57 AM 03/22/2024    4:46 PM  CBC  WBC 4.0 - 10.5 K/uL 9.4  9.9  12.1   Hemoglobin 13.0 - 17.0 g/dL 9.7  9.0  9.4   Hematocrit 39.0 - 52.0 % 30.5  29.0  31.0   Platelets 150 - 400 K/uL 148  148  161       Latest Ref Rng & Units 03/24/2024    4:43 AM 03/23/2024    5:57 AM 03/22/2024    4:46 PM  CMP  Glucose 70 - 99 mg/dL 93  881  837   BUN 8 - 23 mg/dL 37  24  13   Creatinine 0.61 - 1.24 mg/dL 5.96  7.17  8.10   Sodium 135 - 145 mmol/L 138  134  136   Potassium 3.5 - 5.1 mmol/L 3.9  4.1  3.8   Chloride 98 - 111 mmol/L 98  98  91   CO2 22 - 32 mmol/L 26  28  32   Calcium  8.9 - 10.3 mg/dL 8.6  8.2  8.0   Total Protein 6.5 - 8.1 g/dL   6.9   Total Bilirubin 0.0 - 1.2 mg/dL   0.7   Alkaline Phos 38 - 126 U/L   89   AST 15 - 41 U/L   24   ALT 0 - 44 U/L   21     Pertinent Imagings/Other Imagings Plain films and CT images have been personally visualized and interpreted; radiology reports have been reviewed. Decision making incorporated into the Impression /  Recommendations.  MR FOOT RIGHT WO CONTRAST Result Date: 03/23/2024 CLINICAL DATA:  Right foot ulcers.  Concern for osteomyelitis. EXAM: MRI OF THE RIGHT FOREFOOT WITHOUT CONTRAST TECHNIQUE: Multiplanar, multisequence MR imaging of the right foot was performed. No intravenous contrast was administered. COMPARISON:  Right foot radiographs dated 03/22/2024. FINDINGS: Bones/Joint/Cartilage Ulceration at the mid lateral fourth toe with underlying T2/STIR hyperintense marrow signal abnormality and corresponding T1 hypointensity of the fourth digit proximal, middle, and distal phalanges, compatible with osteomyelitis. Moderate arthritic changes of the fourth TMT joint. Hypertrophic osseous changes of the mid to distal fourth metatarsal, may reflect  sequela of prior trauma or infection. No convincing marrow signal abnormality identified elsewhere to suggest osteomyelitis. Status post amputation of the first and second digit phalanges and amputation of the fifth ray at the level of the mid fifth metatarsal shaft. Mild degenerative arthropathy of the midfoot and hindfoot. No joint effusion. Calcaneal enthesopathy at the insertion of the Achilles tendon in the origin of the central cord of the plantar fascia. Ligaments Lisfranc ligament is intact. Muscles and Tendons No significant tenosynovitis. Mild atrophy of the intrinsic foot musculature may reflect chronic denervation changes. Soft tissue Ulceration at the mid lateral fourth toe and plantar base of the fourth toe with surrounding cutaneous thickening and edema. Additional area of chronic appearing ulceration along the medial heel. No loculated fluid collection. IMPRESSION: 1. Ulceration at the lateral fourth toe with osteomyelitis of the underlying fourth toe proximal, middle, and distal phalanges. No abscess. 2. Chronic appearing ulceration along the medial heel. 3. Moderate arthritic changes of the fourth TMT joint. Hypertrophic osseous changes of the mid to distal  fourth metatarsal, may reflect sequela of prior trauma or infection. 4. Status post amputation of the first and second digit phalanges and the fifth ray at the level of the mid fifth metatarsal. Electronically Signed   By: Harrietta Sherry M.D.   On: 03/23/2024 10:40   DG Foot Complete Right Result Date: 03/22/2024 CLINICAL DATA:  Infection EXAM: RIGHT FOOT COMPLETE - 3+ VIEW COMPARISON:  X-ray right foot 08/24/2023 FINDINGS: Limited evaluation due to overlapping osseous structures and overlying soft tissues. Question cortical erosion and destruction of the fourth digit phalanges. Status post first and second digit phalangeal amputations, status post fifth digit metatarsal body amputation. No cortical erosion or destruction. There is no evidence of fracture or dislocation. Plantar and posterior calcaneal spur. No aggressive appearing focal bone abnormality. Forefoot subcutaneus soft tissue edema. Vascular calcification. IMPRESSION: Question cortical erosion destruction of the fourth digit phalanges. Limited evaluation due to overlapping osseous structures and overlying soft tissues. If high clinical concern, please consider MRI for further evaluation (with intravenous contrast if GFR greater than 30). Electronically Signed   By: Morgane  Naveau M.D.   On: 03/22/2024 17:28   DG Chest Port 1 View Result Date: 03/22/2024 CLINICAL DATA:  sepsis EXAM: PORTABLE CHEST 1 VIEW COMPARISON:  Chest x-ray 08/24/2023 FINDINGS: The heart and mediastinal contours are unchanged. No focal consolidation. No pulmonary edema. No pleural effusion. No pneumothorax. No acute osseous abnormality. IMPRESSION: No active disease. Electronically Signed   By: Morgane  Naveau M.D.   On: 03/22/2024 17:25    I spent 85 minutes involved in face-to-face and non-face-to-face activities for this patient on the day of the visit. Professional time spent includes the following activities: Preparing to see the patient (review of tests), Obtaining  and reviewing separately obtained history (ED note, H&P, hospitalist progress note, podiatry notes), Performing a medically appropriate examination and evaluation , Ordering medications/labs, referring and communicating with other health care professionals, Documenting clinical information in the EMR, Independently interpreting results (not separately reported), Communicating results to the patient, Counseling and educating the patient and Care coordination (not separately reported).  Electronically signed by:   Plan d/w requesting provider as well as ID pharm D  Of note, portions of this note may have been created with voice recognition software. While this note has been edited for accuracy, occasional wrong-word or 'sound-a-like' substitutions may have occurred due to the inherent limitations of voice recognition software.   Annalee Orem, MD Infectious Disease Physician  [facility] Pager: [phone]

## 2024-03-24 NOTE — Progress Notes (Signed)
 PROGRESS NOTE [name]  FMW:[id number] DOB: 05-29-1956 DOA: 03/22/2024 PCP: Patient, No Pcp Per  Brief Narrative/Hospital Course: 79 YOM w/ PMH of ESRD on HD MW, HFrEF, PVD s/p left BKA and multiple right toe amputations admitted in the past for necrotizing fasciitis patient presented to the ED room after dialysis center reported fever and tachycardia during dialysis. Patient had reported coughing episodes but denied any shortness of breath.He was brought in to be evaluated for and worked up for his fever.  In ZI:uzfezmjulmz was noted to be 101 hr 110.  BP was 118/72 and oxygen saturation of 94% on room air. He tested positive for COVID-19.  Chest x-ray no active disease.  Labs with stable potassium bicarb creatinine 1.8 CBC mild leukocytosis chronic anemia.X-ray right foot questionable cortical erosion destruction of the fourth digit phalanges Limited evaluation due to overlapping osseous structures and soft tissue.  Subjective: Seen and examined today In comfortably, no new complaints.  Overnight afebrile BP stable Labs with bump in creatinine 4.05 on ESRD  Assessment and plan:  Fever COVID-19 infection: Respiratory status stable asymptomatic largely from COVID infection.  Continue supportive care  S/P impression of the right 1st and 2nd digit and fifth ray Foot infection/osteomyelitis of 4th toe rt: Patient presented with fever tachycardia from dialysis and tested positive for COVID.  X-ray however unremarkable Patient has a right foot ulcer on right small toe and heel-x-ray with some cortical erosion of the fourth digit phalanges-ESR elevated 68 MRI foot>showed ulceration of the lateral fourth toe with osteomyelitis of the underlying fourth toe proximal middle and distal phalanges  Chronic appearing ulceration along the medial heel. Moderate arthritic changes of the fourth TMT joint. Hypertrophic osseous changes of the mid to distal fourth metatarsal, may reflect sequela of prior  trauma or infection. Seen by Dr. Janit from podiatry advised conservative management-will consult ID Continue empiric IV antibiotics for now  ESRD on HD MWF Got HD on Friday next due on Monday.  Consult nephrology.  Chronic HFpEF  Compensated continue home meds   A-fib : Rate controlled continue metoprolol  and Eliquis   Hypertension: Stable on metoprolol   Anemia of chronic kidney disease: Hb stable. Cont  to monitor  Severe malnutrition with BMI Body mass index is 17.71 kg/m.: Augment nutritional status   Mobility: PT Orders: Active PT Follow up Rec: [facility] Without Follow-Up Therapy8/23/2025 1307   DVT prophylaxis: apixaban  (ELIQUIS ) tablet 2.5 mg Start: 03/22/24 2200 SCDs Start: 03/22/24 2010 Code Status:   Code Status: Full Code Family Communication: plan of care discussed with patient at bedside. Patient status is: Remains hospitalized because of severity of illness Level of care: Med-Surg   Dispo: The patient is from: Nursing home            Anticipated disposition: Back to facility in 24 hours   Objective: Vitals last 24 hrs: Vitals:   03/23/24 1628 03/23/24 1921 03/24/24 0425 03/24/24 0822  BP: (!) 146/57 (!) 137/50 (!) 133/53 120/87  Pulse: 80 81 76 83  Resp: 17 18 18 18   Temp: 98.9 F (37.2 C) 98.7 F (37.1 C) 98.2 F (36.8 C) 98.6 F (37 C)  TempSrc: Oral Oral Oral Oral  SpO2: 99% 98% 98% 96%  Weight:      Height:       Physical Examination: General exam: Alert awake oriented.   HEENT:Oral mucosa moist, Ear/Nose WNL grossly Respiratory system: Bilaterally clear BS,no use of accessory muscle Cardiovascular system: S1 & S2 +, No JVD. Gastrointestinal system:  Abdomen soft,NT,ND, BS+ Nervous System: Alert, awake, moving all extremities,and following commands. Extremities: LE edema neg, distal extremities warm.  Right forefoot with wound and on the lateral heel Skin: No rashes,no icterus. MSK: Normal muscle bulk,tone, power    Medications reviewed:  Scheduled Meds:  amiodarone   200 mg Oral Daily   apixaban   2.5 mg Oral BID   atorvastatin   20 mg Oral Daily   bethanechol   5 mg Oral TID   calcitRIOL   0.25 mcg Oral Q T,Th,Sa-HD   cyanocobalamin   1,000 mcg Oral Daily   ferric citrate   210 mg Oral TID WC   ferrous sulfate   325 mg Oral Q breakfast   folic acid   1 mg Oral Daily   [START ON 03/25/2024] metoprolol  tartrate  12.5 mg Oral Once per day on Monday Wednesday Friday   pantoprazole   40 mg Oral BID AC   [START ON 03/25/2024] tamsulosin   0.4 mg Oral Q M,W,F   Continuous Infusions:  ceFEPime  (MAXIPIME ) IV 1 g (03/23/24 1300)   Diet: Diet Order             Diet renal with fluid restriction Fluid restriction: 1200 mL Fluid; Room service appropriate? Yes; Fluid consistency: Thin  Diet effective now                    Data Reviewed: I have personally reviewed following labs and imaging studies ( see epic result tab) CBC: Recent Labs  Lab 03/22/24 1646 03/23/24 0557 03/24/24 0443  WBC 12.1* 9.9 9.4  NEUTROABS 11.0*  --   --   HGB 9.4* 9.0* 9.7*  HCT 31.0* 29.0* 30.5*  MCV 101.0* 100.3* 100.0  PLT 161 148* 148*   CMP: Recent Labs  Lab 03/22/24 1646 03/23/24 0557 03/24/24 0443  NA 136 134* 138  K 3.8 4.1 3.9  CL 91* 98 98  CO2 32 28 26  GLUCOSE 162* 118* 93  BUN 13 24* 37*  CREATININE 1.89* 2.82* 4.03*  CALCIUM  8.0* 8.2* 8.6*   GFR: Estimated Creatinine Clearance: 11.7 mL/min (A) (by C-G formula based on SCr of 4.03 mg/dL (H)). Recent Labs  Lab 03/22/24 1646  AST 24  ALT 21  ALKPHOS 89  BILITOT 0.7  PROT 6.9  ALBUMIN  2.7*   No results for input(s): LIPASE, AMYLASE in the last 168 hours. No results for input(s): AMMONIA in the last 168 hours. Coagulation Profile:  Recent Labs  Lab 03/22/24 1646  INR 1.1   Unresulted Labs (From admission, onward)     Start     Ordered   03/24/24 0500  Basic metabolic panel with GFR  Daily,   R     Question:  Specimen collection  method  Answer:  Lab=Lab collect   03/23/24 0856   03/24/24 0500  CBC  Daily,   R     Question:  Specimen collection method  Answer:  Lab=Lab collect   03/23/24 0856   03/22/24 1545  Urinalysis, w/ Reflex to Culture (Infection Suspected) -Urine, Clean Catch  Once,   URGENT       Question:  Specimen Source  Answer:  Urine, Clean Catch   03/22/24 1545           Antimicrobials/Microbiology: Anti-infectives (From admission, onward)    Start     Dose/Rate Route Frequency Ordered Stop   03/23/24 1300  ceFEPIme  (MAXIPIME ) 1 g in sodium chloride  0.9 % 100 mL IVPB        1 g 200 mL/hr over  30 Minutes Intravenous Every 24 hours 03/23/24 1214     03/23/24 0600  ampicillin -sulbactam (UNASYN ) 1.5 g in sodium chloride  0.9 % 100 mL IVPB  Status:  Discontinued        1.5 g 200 mL/hr over 30 Minutes Intravenous Every 12 hours 03/22/24 2153 03/23/24 1213   03/22/24 2145  vancomycin  (VANCOREADY) IVPB 750 mg/150 mL  Status:  Discontinued       Note to Pharmacy: Pharmacy to dose as appropriate   750 mg 150 mL/hr over 60 Minutes Intravenous Every 24 hours 03/22/24 2132 03/22/24 2153   03/22/24 1630  ceFEPIme  (MAXIPIME ) 2 g in sodium chloride  0.9 % 100 mL IVPB        2 g 200 mL/hr over 30 Minutes Intravenous  Once 03/22/24 1620 03/22/24 1824   03/22/24 1630  metroNIDAZOLE  (FLAGYL ) IVPB 500 mg        500 mg 100 mL/hr over 60 Minutes Intravenous  Once 03/22/24 1620 03/22/24 1824   03/22/24 1630  vancomycin  (VANCOCIN ) IVPB 1000 mg/200 mL premix        1,000 mg 200 mL/hr over 60 Minutes Intravenous  Once 03/22/24 1620 03/22/24 1824         Component Value Date/Time   SDES BLOOD SITE NOT SPECIFIED 03/22/2024 1646   SPECREQUEST  03/22/2024 1646    BOTTLES DRAWN AEROBIC AND ANAEROBIC Blood Culture adequate volume   CULT  03/22/2024 1646    NO GROWTH 2 DAYS Performed at [facility], [address], [place], [place] [address]    REPTSTATUS PENDING 03/22/2024 1646  Procedures:  Mennie LAMY,  MD [facility] 03/24/2024, 11:20 AM

## 2024-03-24 NOTE — Plan of Care (Signed)

## 2024-03-25 ENCOUNTER — Inpatient Hospital Stay (HOSPITAL_COMMUNITY)

## 2024-03-25 DIAGNOSIS — U071 COVID-19: Secondary | ICD-10-CM | POA: Diagnosis not present

## 2024-03-25 DIAGNOSIS — I7090 Unspecified atherosclerosis: Secondary | ICD-10-CM

## 2024-03-25 LAB — BASIC METABOLIC PANEL WITH GFR
Anion gap: 14 (ref 5–15)
BUN: 47 mg/dL — ABNORMAL HIGH (ref 8–23)
CO2: 24 mmol/L (ref 22–32)
Calcium: 8.3 mg/dL — ABNORMAL LOW (ref 8.9–10.3)
Chloride: 100 mmol/L (ref 98–111)
Creatinine, Ser: 5.38 mg/dL — ABNORMAL HIGH (ref 0.61–1.24)
GFR, Estimated: 11 mL/min — ABNORMAL LOW (ref >60)
Glucose, Bld: 113 mg/dL — ABNORMAL HIGH (ref 70–99)
Potassium: 4 mmol/L (ref 3.5–5.1)
Sodium: 138 mmol/L (ref 135–145)

## 2024-03-25 LAB — CBC
HCT: 31.1 % — ABNORMAL LOW (ref 39.0–52.0)
Hemoglobin: 9.7 g/dL — ABNORMAL LOW (ref 13.0–17.0)
MCH: 31 pg (ref 26.0–34.0)
MCHC: 31.2 g/dL (ref 30.0–36.0)
MCV: 99.4 fL (ref 80.0–100.0)
Platelets: 171 K/uL (ref 150–400)
RBC: 3.13 MIL/uL — ABNORMAL LOW (ref 4.22–5.81)
RDW: 16.9 % — ABNORMAL HIGH (ref 11.5–15.5)
WBC: 10.6 K/uL — ABNORMAL HIGH (ref 4.0–10.5)
nRBC: 0 % (ref 0.0–0.2)

## 2024-03-25 LAB — VAS US ABI WITH/WO TBI

## 2024-03-25 MED ORDER — SODIUM CHLORIDE 0.9 % IV SOLN: 2.0000 g | INTRAVENOUS | Status: DC

## 2024-03-25 MED ORDER — VANCOMYCIN HCL 500 MG/100ML IV SOLN
500.0000 mg | INTRAVENOUS | Status: DC
  Filled 2024-03-25: qty 100

## 2024-03-25 MED ORDER — NEPRO/CARBSTEADY PO LIQD
237.0000 mL | Freq: Two times a day (BID) | ORAL | Status: DC
  Administered 2024-03-25 (×2): 237 mL via ORAL

## 2024-03-25 MED ORDER — METRONIDAZOLE 500 MG PO TABS: 500.0000 mg | ORAL_TABLET | Freq: Two times a day (BID) | ORAL | 0 refills | Status: AC

## 2024-03-25 MED ORDER — METRONIDAZOLE 500 MG PO TABS: 500.0000 mg | ORAL_TABLET | Freq: Two times a day (BID) | ORAL | 0 refills | Status: DC

## 2024-03-25 MED ORDER — VANCOMYCIN IV (FOR PTA / DISCHARGE USE ONLY): 500.0000 mg | INTRAVENOUS | Status: AC

## 2024-03-25 MED ORDER — CEFEPIME IV (FOR PTA / DISCHARGE USE ONLY): 2.0000 g | INTRAVENOUS | Status: AC

## 2024-03-25 MED ORDER — METRONIDAZOLE 500 MG PO TABS
500.0000 mg | ORAL_TABLET | Freq: Two times a day (BID) | ORAL | Status: DC
  Administered 2024-03-25: 500 mg via ORAL
  Filled 2024-03-25 (×2): qty 1

## 2024-03-25 NOTE — Progress Notes (Addendum)
 Pt receives out-pt HD at [facility] on MWF 10:25 am chair time. Spoke to clinic RN to be made aware of pt's covid diagnosis and to confirm clinic can provide iv vanc and cefepime  with HD. Clinic RN confirms clinic can provide iv abx with HD at d/c. Navigator contacted by staff to inquire if pt can receive out-pt HD later today or tomorrow. Pt from snf. Contacted clinic who reports pt can receive treatment tomorrow. Pt will need to arrive at 10:40 am for 11:00 am chair time. This info was provided to team. Case management staff will need to contact snf to inquire if snf can transport pt to this appt tomorrow. Discussed this with CSW. Will await update as to if snf can transport pt to this appt tomorrow. Will assist as needed.   Randine Mungo Dialysis Navigator [phone]   Addendum at 1:49 pm; Advised by CSW that snf can transport pt to HD clinic tomorrow. HD appt for tomorrow added to AVS. Contacted clinic and left a message for RN to call navigator back to confirm pt will be at appt tomorrow.   Addendum at 3:27 pm: Spoke to RN at clinic to confirm pt will d/c to snf today and snf will transport pt to HD appt tomorrow.

## 2024-03-25 NOTE — Progress Notes (Signed)
 Pharmacy Antibiotic Note  [name] is a 68 y.o. male admitted on 03/22/2024 with concern for R-foot osteo. Pharmacy has been consulted for Vancomycin  and Cefepime  dosing.  The patient received a 1g loading dose of Vancomycin  on 8/22. ESRD-MWF with plans for HD today, will enter standing doses.   Plan: - Start Vancomycin  500 mg/HD-MWF - Adjust Cefepime  to 2g on MWF @ 1800 - Restart metronidazole  500 mg po bid - planned 7d duration - Will continue to follow HD schedule/duration, culture results, LOT, and antibiotic de-escalation plans   Height: 5' 3 (160 cm) Weight: 45.4 kg (100 lb) IBW/kg (Calculated) : 56.9  Temp (24hrs), Avg:98.6 F (37 C), Min:98.4 F (36.9 C), Max:98.9 F (37.2 C)  Recent Labs  Lab 03/22/24 1646 03/22/24 1854 03/23/24 0557 03/24/24 0443 03/25/24 0544  WBC 12.1*  --  9.9 9.4 10.6*  CREATININE 1.89*  --  2.82* 4.03* 5.38*  LATICACIDVEN  --  2.0*  --   --   --     Estimated Creatinine Clearance: 8.8 mL/min (A) (by C-G formula based on SCr of 5.38 mg/dL (H)).    Allergies  Allergen Reactions   Ultram  [Tramadol ] Nausea And Vomiting    Antimicrobials this admission: Vancomycin  8/22 >> Flagyl  8/22 x 1; restart 8/25 Cefepime  8/22 >>  Dose adjustments this admission: N/a  Microbiology results: 8/22 BCx >> [id number] 8/23 MRSA PCR >> neg 8/22 COVID >> +  Thank you for allowing pharmacy to be a part of this patient's care.  Almarie Lunger, PharmD, BCPS, BCIDP Infectious Diseases Clinical Pharmacist 03/25/2024 9:54 AM   **Pharmacist phone directory can now be found on [email or web address] ([email or web address]).  Listed under [facility].

## 2024-03-25 NOTE — Progress Notes (Signed)
 ABI has been completed.   Results can be found under chart review under CV PROC. 03/25/2024 1:43 PM Giacomo Valone RVT, RDMS

## 2024-03-25 NOTE — Discharge Summary (Addendum)
 Physician Discharge Summary  [name] FMW:[id number] DOB: [birth year] DOA: 03/22/2024  PCP: Patient, No Pcp Per  Admit date: 03/22/2024 Discharge date: 03/25/2024 Recommendations for Outpatient Follow-up:  Follow up with PCP in 1 weeks-call for appointment Please obtain BMP/CBC in one week Continue vancomycin  and cefepime  with HD until 05/02/2023 and oral Flagyl  Continue COVID isolation as per  facility or for at least 10 days  Discharge Dispo: Long-term care facility  Discharge Condition: Stable Code Status:   Code Status: Full Code Diet recommendation:  Diet Order             Diet renal with fluid restriction Fluid restriction: 1200 mL Fluid; Room service appropriate? Yes; Fluid consistency: Thin  Diet effective now                    Brief/Interim Summary: 68 YOM w/ PMH of ESRD on HD MW, HFrEF, PVD s/p left BKA and multiple right toe amputations admitted in the past for necrotizing fasciitis patient presented to the ED room after dialysis center reported fever and tachycardia during dialysis. Patient had reported coughing episodes but denied any shortness of breath.He was brought in to be evaluated for and worked up for his fever.  In ZI:uzfezmjulmz was noted to be 101 hr 110.  BP was 118/72 and oxygen saturation of 94% on room air. He tested positive for COVID-19.  Chest x-ray no active disease.  Labs with stable potassium bicarb creatinine 1.8 CBC mild leukocytosis chronic anemia.X-ray right foot questionable cortical erosion destruction of the fourth digit phalanges Limited evaluation due to overlapping osseous structures and soft tissue. Seen by Dr. Janit from podiatry advised conservative management-ID was consulted:recommending vancomycin  and cefepime  with PRN x 6 weeks Flagyl  twice daily, wound care and follow-up with [facility] 9/23 at 11 AM   Subjective: Seen and examined today No new complaints Alert awake resting comfortably Eager to go back to facility  Discharge  diagnosis:  Fever COVID-19 infection: Respiratory status stable asymptomatic largely from COVID infection.  Continue supportive care, airborne and contact precaution per protocol  Right lateral foot wound with possible right fourth toe osteomyelitis Right heel wound S/P amputationof the right 1st and 2nd digit and fifth ray: X-ray finding concerning for osteomyelitis underwent MRI foot that showed>showed ulceration of the lateral fourth toe with osteomyelitis of the underlying fourth toe proximal middle and distal phalanges Chronic appearing ulceration along the medial heel. Moderate arthritic changes of the fourth TMT joint. Hypertrophic osseous changes of the mid to distal fourth metatarsal, may reflect sequela of prior trauma or infection. Seen by Dr. Janit from podiatry advised conservative management-ID was consulted : recommending vancomycin  and cefepime  with HDx 6 weeks Flagyl  twice daily, wound care and follow-up with [facility] 9/23 at 11 AM   ESRD on HD MWF: Got HD on Friday next due on 8/25 -this will be decided by nephrology   Chronic HFpEF:  Compensated continue home meds.  Address volume with dialysis  A-fib : Rate controlled continue metoprolol  and Eliquis   Hypertension: Stable on metoprolol   Anemia of chronic kidney disease: Hb stable. Cont  to monitor  Severe malnutrition with BMI Body mass index is 17.71 kg/m.: Augment nutritional status   Mobility: PT Orders: Active PT Follow up Rec: Long-Term Institutional Care Without Follow-Up Therapy8/23/2025 1307   DVT prophylaxis: apixaban  (ELIQUIS ) tablet 2.5 mg Start: 03/22/24 2200 SCDs Start: 03/22/24 2010 Code Status:   Code Status: Full Code Family Communication: plan of care discussed with patient at  bedside. Patient status is: Remains hospitalized because of severity of illness Level of care: Med-Surg   Dispo: The patient is from: Nursing home            Anticipated disposition: Plan to discharge back to  nursing home after dialysis today   Objective: Vitals last 24 hrs: Vitals:   03/24/24 1643 03/24/24 2155 03/25/24 0513 03/25/24 1004  BP: (!) 148/52 109/67 102/89 132/60  Pulse: 83 82 83 83  Resp: 18 20 18 18   Temp: 98.4 F (36.9 C) 98.9 F (37.2 C) 98.5 F (36.9 C) 98.5 F (36.9 C)  TempSrc: Oral Oral Oral   SpO2: 96% 98% 93% 95%  Weight:      Height:       Physical Examination: General exam: Alert awake and pleasant  HEENT:Oral mucosa moist, Ear/Nose WNL grossly Respiratory system: Bilaterally clear BS,no use of accessory muscle Cardiovascular system: S1 & S2 +, No JVD. Gastrointestinal system: Abdomen soft,NT,ND, BS+ Nervous System: Alert, awake, moving all extremities,and following commands. Extremities: LE edema neg, distal extremities warm.  Right forefoot with wound and on the lateral heel Skin: No rashes,no icterus. MSK: Normal muscle bulk,tone, power      Consultation: Nephrology Infectious disease Podiatry  Discharge Instructions  Discharge Instructions     Discharge instructions   Complete by: As directed    Continue with COVID isolation protocol as per the facility or 10 days at least  Please call call MD or return to ER for similar or worsening recurring problem that brought you to hospital or if any fever,nausea/vomiting,abdominal pain, uncontrolled pain, chest pain,  shortness of breath or any other alarming symptoms.  Please follow-up your doctor as instructed in a week time and call the office for appointment.  Please avoid alcohol, smoking, or any other illicit substance and maintain healthy habits including taking your regular medications as prescribed.  You were cared for by a hospitalist during your hospital stay. If you have any questions about your discharge medications or the care you received while you were in the hospital after you are discharged, you can call the unit and ask to speak with the hospitalist on call if the hospitalist that  took care of you is not available.  Once you are discharged, your primary care physician will handle any further medical issues. Please note that NO REFILLS for any discharge medications will be authorized once you are discharged, as it is imperative that you return to your primary care physician (or establish a relationship with a primary care physician if you do not have one) for your aftercare needs so that they can reassess your need for medications and monitor your lab values   Discharge wound care:   Complete by: As directed    Foam to the left stump wound; change every other 3 days Wound care  Daily  on right foot wounds with betadine, allow to air dry.  Offload heel with Prevalon boot   Home infusion instructions   Complete by: As directed    To be given at the patient's hemodialysis center   Instructions: Flushing of vascular access device: 0.9% NaCl pre/post medication administration and prn patency; Heparin  100 u/ml, 5ml for implanted ports and Heparin  10u/ml, 5ml for all other central venous catheters.   Increase activity slowly   Complete by: As directed       Allergies as of 03/25/2024       Reactions   Ultram  [tramadol ] Nausea And Vomiting  Medication List     TAKE these medications    amiodarone  200 MG tablet Commonly known as: PACERONE  Take 1 tablet (200 mg total) by mouth daily.   apixaban  2.5 MG Tabs tablet Commonly known as: ELIQUIS  Take 1 tablet (2.5 mg total) by mouth 2 (two) times daily.   atorvastatin  20 MG tablet Commonly known as: LIPITOR Take 1 tablet (20 mg total) by mouth daily. What changed: when to take this   Auryxia  1 GM 210 MG(Fe) tablet Generic drug: ferric citrate  Take 210 mg by mouth with breakfast, with lunch, and with evening meal.   bethanechol  5 MG tablet Commonly known as: URECHOLINE  Take 5 mg by mouth 3 (three) times daily.   ceFEPime  IVPB Commonly known as: MAXIPIME  Inject 2 g into the vein every Monday, Wednesday,  and Friday with hemodialysis. Indication:  R-foot osteo Last Day of Therapy:  With HD session on 05/01/24 Labs - Once weekly:  CBC/D and BMP, ESR and CRP Method of administration: Per HD protocol - to be given at the paitent's HD center   clotrimazole -betamethasone cream Commonly known as: LOTRISONE Apply 1 Application topically See admin instructions. Apply topically to rash every day shift.   cyanocobalamin  1000 MCG tablet Take 1,000 mcg by mouth daily.   folic acid  1 MG tablet Commonly known as: FOLVITE  Take 1 mg by mouth daily.   guaiFENesin  100 MG/5ML liquid Commonly known as: ROBITUSSIN Take 5 mLs by mouth every 6 (six) hours as needed for cough or to loosen phlegm.   hydrocortisone  cream 1 % Apply 1 Application topically daily as needed (rash).   liver oil-zinc oxide 40 % ointment Commonly known as: DESITIN Apply 1 Application topically in the morning and at bedtime. Apply topically to buttocks and sacrum area every day and night shift.   metoprolol  tartrate 25 MG tablet Commonly known as: LOPRESSOR  Take 12.5 mg by mouth See admin instructions. Give 1/2 tablet (12.5mg ) by mouth three days a week on Tuesday, Thursday, Saturday.   metroNIDAZOLE  500 MG tablet Commonly known as: FLAGYL  Take 1 tablet (500 mg total) by mouth every 12 (twelve) hours for 6 days.   OXYGEN Inhale 2 L/min into the lungs as needed (shortness of breath or maintain sats > 90%).   pantoprazole  40 MG tablet Commonly known as: PROTONIX  Take 1 tablet (40 mg total) by mouth 2 (two) times daily before a meal.   Pro-Stat Liqd Take 30 mLs by mouth 2 (two) times daily.   promethazine  25 MG/ML injection Commonly known as: PHENERGAN  Inject 25 mg into the muscle every 4 (four) hours as needed for nausea or vomiting.   tamsulosin  0.4 MG Caps capsule Commonly known as: FLOMAX  Take 0.4 mg by mouth See admin instructions. Take one tablet by mouth 3 times a week on Monday, Wednesday and Fridays    vancomycin  IVPB Inject 500 mg into the vein every Monday, Wednesday, and Friday with hemodialysis. Indication:  R-foot osteo Last Day of Therapy:  With HD on 05/01/24 Labs - Once weekly:  CBC/D, BMP, and vancomycin  trough, ESR, CRP Method of administration: Per HD protocol - to be given at the patient's HD center               Home Infusion Instuctions  (From admission, onward)           Start     Ordered   03/25/24 0000  Home infusion instructions       Comments: To be given at the patient's hemodialysis  center  Question:  Instructions  Answer:  Flushing of vascular access device: 0.9% NaCl pre/post medication administration and prn patency; Heparin  100 u/ml, 5ml for implanted ports and Heparin  10u/ml, 5ml for all other central venous catheters.   03/25/24 1010              Discharge Care Instructions  (From admission, onward)           Start     Ordered   03/25/24 0000  Discharge wound care:       Comments: Foam to the left stump wound; change every other 3 days Wound care  Daily  on right foot wounds with betadine, allow to air dry.  Offload heel with Prevalon boot   03/25/24 1120            Contact information for follow-up providers     [place], [facility]. Go on 03/26/2024.   Why: On Tuesday only, please arrive at 10:40 am for 11:00 am chair time. Please resume normal days and time on Wednesday. Contact information: [address] [place] [address] [phone]              Contact information for after-discharge care     Destination     [facility] .   Service: Skilled Nursing Contact information: [address] [place]  [address] [phone]                    Allergies  Allergen Reactions   Ultram  [Tramadol ] Nausea And Vomiting    The results of significant diagnostics from this hospitalization (including imaging, microbiology, ancillary and laboratory) are listed below for  reference.    Microbiology: Recent Results (from the past 240 hours)  Culture, blood (Routine x 2)     Status: None (Preliminary result)   Collection Time: 03/22/24  3:50 PM   Specimen: BLOOD  Result Value Ref Range Status   Specimen Description BLOOD BLOOD RIGHT HAND  Final   Special Requests   Final    AEROBIC BOTTLE ONLY Blood Culture results may not be optimal due to an inadequate volume of blood received in culture bottles   Culture   Final    NO GROWTH 3 DAYS Performed at [facility], [address], [place], [place] [address]    Report Status PENDING  Incomplete  Culture, blood (Routine x 2)     Status: None (Preliminary result)   Collection Time: 03/22/24  4:46 PM   Specimen: BLOOD  Result Value Ref Range Status   Specimen Description BLOOD SITE NOT SPECIFIED  Final   Special Requests   Final    BOTTLES DRAWN AEROBIC AND ANAEROBIC Blood Culture adequate volume   Culture   Final    NO GROWTH 3 DAYS Performed at [facility], [address], [place], [place] [address]    Report Status PENDING  Incomplete  Resp panel by RT-PCR (RSV, Flu A&B, Covid) Anterior Nasal Swab     Status: Abnormal   Collection Time: 03/22/24  6:30 PM   Specimen: Anterior Nasal Swab  Result Value Ref Range Status   SARS Coronavirus 2 by RT PCR POSITIVE (A) NEGATIVE Final   Influenza A by PCR NEGATIVE NEGATIVE Final   Influenza B by PCR NEGATIVE NEGATIVE Final    Comment: (NOTE) The Xpert Xpress SARS-CoV-2/FLU/RSV plus assay is intended as an aid in the diagnosis of influenza from Nasopharyngeal swab specimens and should not be used as  a sole basis for treatment. Nasal washings and aspirates are unacceptable for Xpert Xpress SARS-CoV-2/FLU/RSV testing.  Fact Sheet for Patients: BloggerCourse.com  Fact Sheet for Healthcare Providers: SeriousBroker.it  This test is not yet approved or cleared by the [place]  FDA and has been  authorized for detection and/or diagnosis of SARS-CoV-2 by FDA under an Emergency Use Authorization (EUA). This EUA will remain in effect (meaning this test can be used) for the duration of the COVID-19 declaration under Section 564(b)(1) of the Act, 21 U.S.C. section 360bbb-3(b)(1), unless the authorization is terminated or revoked.     Resp Syncytial Virus by PCR NEGATIVE NEGATIVE Final    Comment: (NOTE) Fact Sheet for Patients: BloggerCourse.com  Fact Sheet for Healthcare Providers: SeriousBroker.it  This test is not yet approved or cleared by the [place]  FDA and has been authorized for detection and/or diagnosis of SARS-CoV-2 by FDA under an Emergency Use Authorization (EUA). This EUA will remain in effect (meaning this test can be used) for the duration of the COVID-19 declaration under Section 564(b)(1) of the Act, 21 U.S.C. section 360bbb-3(b)(1), unless the authorization is terminated or revoked.  Performed at [facility], [address] [place], [place] [address]   MRSA Next Gen by PCR, Nasal     Status: None   Collection Time: 03/23/24  6:53 AM   Specimen: Nasal Mucosa; Nasal Swab  Result Value Ref Range Status   MRSA by PCR Next Gen NOT DETECTED NOT DETECTED Final    Comment: (NOTE) The GeneXpert MRSA Assay (FDA approved for NASAL specimens only), is one component of a comprehensive MRSA colonization surveillance program. It is not intended to diagnose MRSA infection nor to guide or monitor treatment for MRSA infections. Test performance is not FDA approved in patients less than 60 years old. Performed at [facility], [address], [place], [place] [address]     Procedures/Studies: VAS US  ABI WITH/WO TBI Result Date: 03/25/2024  LOWER EXTREMITY DOPPLER STUDY Patient Name:  Rydge Texidor  Date of Exam:   03/25/2024 Medical Rec #: [id number]     Accession #:    [id number] Date of Birth:  1956/01/16    Patient Gender: M Patient Age:   8 years Exam Location:  [facility] Procedure:      VAS US  ABI WITH/WO TBI Referring Phys: ANNALEE OREM --------------------------------------------------------------------------------  High Risk Factors: Hypertension, Diabetes, no history of smoking, prior MI,                    coronary artery disease. Other Factors: ESRD (HD), CHF, Afib, LLE BKA (2015), RLE toe amputations (1,2, &                5) (2015).  Performing Technologist: Leigh Rom RVT/RDMS  Examination Guidelines: A complete evaluation includes at minimum, Doppler waveform signals and systolic blood pressure reading at the level of bilateral brachial, anterior tibial, and posterior tibial arteries, when vessel segments are accessible. Bilateral testing is considered an integral part of a complete examination. Photoelectric Plethysmograph (PPG) waveforms and toe systolic pressure readings are included as required and additional duplex testing as needed. Limited examinations for reoccurring indications may be performed as noted.  ABI Findings: +--------+------------------+-----+-----------+----------------+ Right   Rt Pressure (mmHg)IndexWaveform   Comment          +--------+------------------+-----+-----------+----------------+ Brachial                       multiphasicnon compressible +--------+------------------+-----+-----------+----------------+  PTA                            monophasic non compressible +--------+------------------+-----+-----------+----------------+ DP                             monophasic non compressible +--------+------------------+-----+-----------+----------------+ +-------+-----------+-----------+------------+------------+ ABI/TBIToday's ABIToday's TBIPrevious ABIPrevious TBI +-------+-----------+-----------+------------+------------+ Right  [place]         AMP        [place]          AMP           +-------+-----------+-----------+------------+------------+ Left   BKA        BKA        BKA         BKA          +-------+-----------+-----------+------------+------------+ Arterial wall calcification precludes accurate ankle pressures and ABIs. Right ABIs appear essentially unchanged.  Summary: Right: Non- diagnostic study due to vessel calcification and non compressible vessels.  Left: BKA  *See table(s) above for measurements and observations.     Preliminary    MR FOOT RIGHT WO CONTRAST Result Date: 03/23/2024 CLINICAL DATA:  Right foot ulcers.  Concern for osteomyelitis. EXAM: MRI OF THE RIGHT FOREFOOT WITHOUT CONTRAST TECHNIQUE: Multiplanar, multisequence MR imaging of the right foot was performed. No intravenous contrast was administered. COMPARISON:  Right foot radiographs dated 03/22/2024. FINDINGS: Bones/Joint/Cartilage Ulceration at the mid lateral fourth toe with underlying T2/STIR hyperintense marrow signal abnormality and corresponding T1 hypointensity of the fourth digit proximal, middle, and distal phalanges, compatible with osteomyelitis. Moderate arthritic changes of the fourth TMT joint. Hypertrophic osseous changes of the mid to distal fourth metatarsal, may reflect sequela of prior trauma or infection. No convincing marrow signal abnormality identified elsewhere to suggest osteomyelitis. Status post amputation of the first and second digit phalanges and amputation of the fifth ray at the level of the mid fifth metatarsal shaft. Mild degenerative arthropathy of the midfoot and hindfoot. No joint effusion. Calcaneal enthesopathy at the insertion of the Achilles tendon in the origin of the central cord of the plantar fascia. Ligaments Lisfranc ligament is intact. Muscles and Tendons No significant tenosynovitis. Mild atrophy of the intrinsic foot musculature may reflect chronic denervation changes. Soft tissue Ulceration at the mid lateral fourth toe and plantar base of the fourth toe  with surrounding cutaneous thickening and edema. Additional area of chronic appearing ulceration along the medial heel. No loculated fluid collection. IMPRESSION: 1. Ulceration at the lateral fourth toe with osteomyelitis of the underlying fourth toe proximal, middle, and distal phalanges. No abscess. 2. Chronic appearing ulceration along the medial heel. 3. Moderate arthritic changes of the fourth TMT joint. Hypertrophic osseous changes of the mid to distal fourth metatarsal, may reflect sequela of prior trauma or infection. 4. Status post amputation of the first and second digit phalanges and the fifth ray at the level of the mid fifth metatarsal. Electronically Signed   By: Harrietta Sherry M.D.   On: 03/23/2024 10:40   DG Foot Complete Right Result Date: 03/22/2024 CLINICAL DATA:  Infection EXAM: RIGHT FOOT COMPLETE - 3+ VIEW COMPARISON:  X-ray right foot 08/24/2023 FINDINGS: Limited evaluation due to overlapping osseous structures and overlying soft tissues. Question cortical erosion and destruction of the fourth digit phalanges. Status post first and second digit phalangeal amputations, status post fifth digit metatarsal body amputation. No cortical erosion or destruction. There is no evidence of fracture  or dislocation. Plantar and posterior calcaneal spur. No aggressive appearing focal bone abnormality. Forefoot subcutaneus soft tissue edema. Vascular calcification. IMPRESSION: Question cortical erosion destruction of the fourth digit phalanges. Limited evaluation due to overlapping osseous structures and overlying soft tissues. If high clinical concern, please consider MRI for further evaluation (with intravenous contrast if GFR greater than 30). Electronically Signed   By: Morgane  Naveau M.D.   On: 03/22/2024 17:28   DG Chest Port 1 View Result Date: 03/22/2024 CLINICAL DATA:  sepsis EXAM: PORTABLE CHEST 1 VIEW COMPARISON:  Chest x-ray 08/24/2023 FINDINGS: The heart and mediastinal contours are  unchanged. No focal consolidation. No pulmonary edema. No pleural effusion. No pneumothorax. No acute osseous abnormality. IMPRESSION: No active disease. Electronically Signed   By: Morgane  Naveau M.D.   On: 03/22/2024 17:25    Labs: BNP (last 3 results) Recent Labs    08/24/23 2149  BNP 809.2*   Basic Metabolic Panel: Recent Labs  Lab 03/22/24 1646 03/23/24 0557 03/24/24 0443 03/25/24 0544  NA 136 134* 138 138  K 3.8 4.1 3.9 4.0  CL 91* 98 98 100  CO2 32 28 26 24   GLUCOSE 162* 118* 93 113*  BUN 13 24* 37* 47*  CREATININE 1.89* 2.82* 4.03* 5.38*  CALCIUM  8.0* 8.2* 8.6* 8.3*   Liver Function Tests: Recent Labs  Lab 03/22/24 1646  AST 24  ALT 21  ALKPHOS 89  BILITOT 0.7  PROT 6.9  ALBUMIN  2.7*   No results for input(s): LIPASE, AMYLASE in the last 168 hours. No results for input(s): AMMONIA in the last 168 hours. CBC: Recent Labs  Lab 03/22/24 1646 03/23/24 0557 03/24/24 0443 03/25/24 0544  WBC 12.1* 9.9 9.4 10.6*  NEUTROABS 11.0*  --   --   --   HGB 9.4* 9.0* 9.7* 9.7*  HCT 31.0* 29.0* 30.5* 31.1*  MCV 101.0* 100.3* 100.0 99.4  PLT 161 148* 148* 171   CBG: No results for input(s): GLUCAP in the last 168 hours. No results for input(s): TSH, T4TOTAL, T3FREE, THYROIDAB in the last 72 hours.  Invalid input(s): FREET3 Urinalysis    Component Value Date/Time   COLORURINE AMBER (A) 08/25/2023 0437   APPEARANCEUR CLOUDY (A) 08/25/2023 0437   LABSPEC 1.015 08/25/2023 0437   PHURINE 9.0 (H) 08/25/2023 0437   GLUCOSEU NEGATIVE 08/25/2023 0437   HGBUR SMALL (A) 08/25/2023 0437   BILIRUBINUR NEGATIVE 08/25/2023 0437   KETONESUR NEGATIVE 08/25/2023 0437   PROTEINUR 100 (A) 08/25/2023 0437   UROBILINOGEN 0.2 09/15/2013 0052   NITRITE NEGATIVE 08/25/2023 0437   LEUKOCYTESUR LARGE (A) 08/25/2023 0437   Sepsis Labs Recent Labs  Lab 03/22/24 1646 03/23/24 0557 03/24/24 0443 03/25/24 0544  WBC 12.1* 9.9 9.4 10.6*   Microbiology Recent  Results (from the past 240 hours)  Culture, blood (Routine x 2)     Status: None (Preliminary result)   Collection Time: 03/22/24  3:50 PM   Specimen: BLOOD  Result Value Ref Range Status   Specimen Description BLOOD BLOOD RIGHT HAND  Final   Special Requests   Final    AEROBIC BOTTLE ONLY Blood Culture results may not be optimal due to an inadequate volume of blood received in culture bottles   Culture   Final    NO GROWTH 3 DAYS Performed at [facility], [address] [place], [place] [address]    Report Status PENDING  Incomplete  Culture, blood (Routine x 2)     Status: None (Preliminary result)  Collection Time: 03/22/24  4:46 PM   Specimen: BLOOD  Result Value Ref Range Status   Specimen Description BLOOD SITE NOT SPECIFIED  Final   Special Requests   Final    BOTTLES DRAWN AEROBIC AND ANAEROBIC Blood Culture adequate volume   Culture   Final    NO GROWTH 3 DAYS Performed at [facility], [address], [place], [place] [address]    Report Status PENDING  Incomplete  Resp panel by RT-PCR (RSV, Flu A&B, Covid) Anterior Nasal Swab     Status: Abnormal   Collection Time: 03/22/24  6:30 PM   Specimen: Anterior Nasal Swab  Result Value Ref Range Status   SARS Coronavirus 2 by RT PCR POSITIVE (A) NEGATIVE Final   Influenza A by PCR NEGATIVE NEGATIVE Final   Influenza B by PCR NEGATIVE NEGATIVE Final    Comment: (NOTE) The Xpert Xpress SARS-CoV-2/FLU/RSV plus assay is intended as an aid in the diagnosis of influenza from Nasopharyngeal swab specimens and should not be used as a sole basis for treatment. Nasal washings and aspirates are unacceptable for Xpert Xpress SARS-CoV-2/FLU/RSV testing.  Fact Sheet for Patients: BloggerCourse.com  Fact Sheet for Healthcare Providers: SeriousBroker.it  This test is not yet approved or cleared by the [place]  FDA and has been authorized for detection and/or  diagnosis of SARS-CoV-2 by FDA under an Emergency Use Authorization (EUA). This EUA will remain in effect (meaning this test can be used) for the duration of the COVID-19 declaration under Section 564(b)(1) of the Act, 21 U.S.C. section 360bbb-3(b)(1), unless the authorization is terminated or revoked.     Resp Syncytial Virus by PCR NEGATIVE NEGATIVE Final    Comment: (NOTE) Fact Sheet for Patients: BloggerCourse.com  Fact Sheet for Healthcare Providers: SeriousBroker.it  This test is not yet approved or cleared by the [place]  FDA and has been authorized for detection and/or diagnosis of SARS-CoV-2 by FDA under an Emergency Use Authorization (EUA). This EUA will remain in effect (meaning this test can be used) for the duration of the COVID-19 declaration under Section 564(b)(1) of the Act, 21 U.S.C. section 360bbb-3(b)(1), unless the authorization is terminated or revoked.  Performed at [facility], [address], [place], [place] [address]   MRSA Next Gen by PCR, Nasal     Status: None   Collection Time: 03/23/24  6:53 AM   Specimen: Nasal Mucosa; Nasal Swab  Result Value Ref Range Status   MRSA by PCR Next Gen NOT DETECTED NOT DETECTED Final    Comment: (NOTE) The GeneXpert MRSA Assay (FDA approved for NASAL specimens only), is one component of a comprehensive MRSA colonization surveillance program. It is not intended to diagnose MRSA infection nor to guide or monitor treatment for MRSA infections. Test performance is not FDA approved in patients less than 47 years old. Performed at [facility], [address], [place], [place] [address]    Time coordinating discharge: 35 minutes  SIGNED: Mennie LAMY, MD  [facility] 03/25/2024, 2:34 PM  If 7PM-7AM, please contact night-coverage [email or web address]

## 2024-03-25 NOTE — Progress Notes (Signed)
 PHARMACY CONSULT NOTE FOR:  OUTPATIENT  PARENTERAL ANTIBIOTIC THERAPY (OPAT)  Informational as the patient will receive antibiotics at the hemodialysis center outpatient  Indication: R-foot osteo Regimen: Vancomycin  500 mg/HD-MWF + Cefepime  2g/HD-MWF End date: 05/01/24  Metronidazole  to continue at a dose of 500 mg po every 12 hours for 7 days thru 8/31  IV antibiotic discharge orders are pended. To discharging provider:  please sign these orders via discharge navigator,  Select New Orders & click on the button choice - Manage This Unsigned Work.     Thank you for allowing pharmacy to be a part of this patient's care.  Almarie Lunger, PharmD, BCPS, BCIDP Infectious Diseases Clinical Pharmacist 03/25/2024 9:57 AM   **Pharmacist phone directory can now be found on [email or web address] ([email or web address]).  Listed under [facility].

## 2024-03-25 NOTE — Progress Notes (Signed)
 [facility] Progress Note   Subjective: Seen in room. Watching TV. Afebrile. No complaints. Denies chest pain, sob, n/v. For dialysis today.   Objective Vitals:   03/24/24 1643 03/24/24 2155 03/25/24 0513 03/25/24 1004  BP: (!) 148/52 109/67 102/89 132/60  Pulse: 83 82 83 83  Resp: 18 20 18 18   Temp: 98.4 F (36.9 C) 98.9 F (37.2 C) 98.5 F (36.9 C) 98.5 F (36.9 C)  TempSrc: Oral Oral Oral   SpO2: 96% 98% 93% 95%  Weight:      Height:        Additional Objective Labs: Basic Metabolic Panel: Recent Labs  Lab 03/23/24 0557 03/24/24 0443 03/25/24 0544  NA 134* 138 138  K 4.1 3.9 4.0  CL 98 98 100  CO2 28 26 24   GLUCOSE 118* 93 113*  BUN 24* 37* 47*  CREATININE 2.82* 4.03* 5.38*  CALCIUM  8.2* 8.6* 8.3*   CBC: Recent Labs  Lab 03/22/24 1646 03/23/24 0557 03/24/24 0443 03/25/24 0544  WBC 12.1* 9.9 9.4 10.6*  NEUTROABS 11.0*  --   --   --   HGB 9.4* 9.0* 9.7* 9.7*  HCT 31.0* 29.0* 30.5* 31.1*  MCV 101.0* 100.3* 100.0 99.4  PLT 161 148* 148* 171   Blood Culture    Component Value Date/Time   SDES BLOOD SITE NOT SPECIFIED 03/22/2024 1646   SPECREQUEST  03/22/2024 1646    BOTTLES DRAWN AEROBIC AND ANAEROBIC Blood Culture adequate volume   CULT  03/22/2024 1646    NO GROWTH 3 DAYS Performed at [facility], [address] [place], [place] [address]    REPTSTATUS PENDING 03/22/2024 1646     Physical Exam General: Alert, nad, on room air Heart: RRR Lungs: Clear, normal wob Abdomen: non tender Extremities: no LE edema  Dialysis Access: LUE AVF +bruit   Medications:  ceFEPime  (MAXIPIME ) IV     vancomycin       amiodarone   200 mg Oral Daily   apixaban   2.5 mg Oral BID   atorvastatin   20 mg Oral Daily   bethanechol   5 mg Oral TID   calcitRIOL   0.25 mcg Oral Q T,Th,Sa-HD   Chlorhexidine  Gluconate Cloth  6 each Topical Q0600   cyanocobalamin   1,000 mcg Oral Daily   feeding supplement (NEPRO CARB STEADY)  237 mL Oral BID BM    ferric citrate   210 mg Oral TID WC   ferrous sulfate   325 mg Oral Q breakfast   folic acid   1 mg Oral Daily   metoprolol  tartrate  12.5 mg Oral Once per day on Monday Wednesday Friday   metroNIDAZOLE   500 mg Oral Q12H   pantoprazole   40 mg Oral BID AC   tamsulosin   0.4 mg Oral Q M,W,F    Dialysis Orders: MWF G-O 3h  B400   51.5kg   2K bath  AVF   Heparin  none Last OP HD 8/22, post wt 52.8kg Mircera 100 q 2, last 8/20, due 9/05  Assessment/Plan: R foot osteo: Per imaging. Antibiotics per ID. Plan to continue IV Vanc 500 mg + IV cefepime  2 g with HD until 05/01/24 COVID-19 infection. Not symptomatic. Supportive management.  ESRD: on HD MWF. Last HD Friday. Next HD Monday.  HTN: Blood pressure acceptable. Continue home meds.  Volume: looks euvolemic on exam, usually comes off close to dry wt at OP unit.  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 Follow, transfuse prn.  Ian Ronnald Acosta PA-C [facility] 03/25/2024,10:16 AM

## 2024-03-25 NOTE — Plan of Care (Signed)

## 2024-03-25 NOTE — Discharge Planning (Signed)
 [facility] Patient Discharge Orders- [facility] CLINIC: GOC  Patient's name: [name] Admit/DC Dates: 03/22/2024 - 03/25/2024  Discharge Diagnoses: R foot osteomyelitis  COVID-19 infection   Outpatient Dialysis Orders:  -Heparin : No change  -EDW No change   -Bath: No change   Anemia Aranesp : Given: --   Date of last dose/amount: --   PRBC's Given: -- Date/# of units: -- ESA dose for discharge: Mircera 100 mcg IV q 2 wks   Recent Labs  Lab 03/22/24 1646 03/23/24 0557 03/25/24 0544  HGB 9.4*   < > 9.7*  K 3.8   < > 4.0  CALCIUM  8.0*   < > 8.3*  ALBUMIN  2.7*  --   --    < > = values in this interval not displayed.   Access intervention/Change: --  Medications: -IV Antibiotics: IV Vanc 500 mg + IV cefepime  2 g with HD until 05/01/24 for osteomyelitis  -Anticoagulation: On Eliquis  for AFib  OTHER/APPTS/LABS -Weekly Vanc trough starting week of 9/1   Completed by: Maisie Ronnald Acosta PA-C   D/C Meds to be reconciled by nurse after every discharge.    Reviewed by: MD:______ RN_______

## 2024-03-25 NOTE — TOC Transition Note (Signed)
 Transition of Care [facility]) - Discharge Note   Patient Details  Name: [name] MRN: [id number] Date of Birth: [birth year]  [facility] CM/SW Contact:  Lendia Dais, LCSWA Phone Number: 03/25/2024, 2:23 PM   Clinical Narrative:  Patient will discharge [facility]. RN report to [phone]. CSW called [facility] at 2:15pm.  CSW informed RN of transportation time. Pt has no further needs, discharge complete.   Final next level of care: Skilled Nursing Facility Barriers to Discharge: Barriers Resolved   Patient Goals and CMS Choice            Discharge Placement PASRR number recieved: 03/25/24            Patient chooses bed at: [facility] Patient to be transferred to facility by: [facility]   Patient and family notified of of transfer: 03/25/24  Discharge Plan and Services Additional resources added to the After Visit Summary for       Post Acute Care Choice: Skilled Nursing Facility                               Social Drivers of Health (SDOH) Interventions SDOH Screenings   Food Insecurity: Patient Declined (03/23/2024)  Housing: Patient Declined (03/23/2024)  Transportation Needs: Patient Unable To Answer (03/25/2024)  Utilities: Patient Unable To Answer (03/25/2024)  Social Connections: Patient Unable To Answer (03/25/2024)  Tobacco Use: Low Risk  (03/22/2024)     Readmission Risk Interventions     No data to display

## 2024-03-26 LAB — HEPATITIS B SURFACE ANTIBODY, QUANTITATIVE: Hep B S AB Quant (Post): <3.5 m[IU]/mL — ABNORMAL LOW

## 2024-03-27 LAB — CULTURE, BLOOD (ROUTINE X 2)
Culture: NO GROWTH
Culture: NO GROWTH
Special Requests: ADEQUATE

## 2024-03-28 ENCOUNTER — Other Ambulatory Visit: Payer: Self-pay | Admitting: *Deleted

## 2024-03-28 DIAGNOSIS — Z992 Dependence on renal dialysis: Secondary | ICD-10-CM

## 2024-03-28 DIAGNOSIS — M79603 Pain in arm, unspecified: Secondary | ICD-10-CM

## 2024-04-03 NOTE — Progress Notes (Unsigned)
 Office Note     CC:  Fistula malfunction Requesting Provider:  Jerrye Katheryn BROCKS, MD  HPI: [name] is a 68 y.o. ([birth year]) male who presents at the request of Dr. Jerrye due to concern for steal syndrome.  Left BC fistula created in 2021 by Dr. Oris Roosevelt was last revised in November of 2023 by my partner Dr. Eliza.   Patient has a nonhealing wound on the left hand prompting the recent referral.  On exam today [name] was doing well. No complaints. No issues at dialysis. Normal runs, no de-cannulation bleeding.  No sxs of steal.   Past Medical History:  Diagnosis Date   CHF (congestive heart failure) (HCC)    Diabetes mellitus without complication (HCC)    PATIENT JUST LEARNED HE WAS DIABETIC   ESRD on hemodialysis (HCC)    TTS at [facility]   Hepatitis    Hyperlipemia    Hypertension    Necrotizing fasciitis (HCC)    Pneumonia    HX OF PNA    Past Surgical History:  Procedure Laterality Date   A/V FISTULAGRAM N/A 08/16/2023   Procedure: A/V Fistulagram;  Surgeon: Melia Lynwood ORN, MD;  Location: [facility] INVASIVE CV LAB;  Service: Cardiovascular;  Laterality: N/A;   AMPUTATION Left 09/11/2013   Procedure: AMPUTATION BELOW KNEE;  Surgeon: Kay Ozell Cummins, MD;  Location: [facility] OR;  Service: Orthopedics;  Laterality: Left;   AMPUTATION Right 12/18/2013   Procedure: RIGHT FOOT 1,2, TOE AMPUTATION  5th toe RAY AMPUTATION;  Surgeon: Kay Ozell Cummins, MD;  Location: [facility] OR;  Service: Orthopedics;  Laterality: Right;   AV FISTULA PLACEMENT Left 12/11/2019   Procedure: LEFT BRACHIOCEPHALIC FISTULA CREATION;  Surgeon: Oris Krystal FALCON, MD;  Location: [facility] OR;  Service: Vascular;  Laterality: Left;   BIOPSY  07/19/2023   Procedure: BIOPSY;  Surgeon: Albertus Gordy HERO, MD;  Location: [facility] ENDOSCOPY;  Service: Gastroenterology;;   CHOLECYSTECTOMY     ESOPHAGOGASTRODUODENOSCOPY (EGD) WITH PROPOFOL  N/A 07/19/2023   Procedure: ESOPHAGOGASTRODUODENOSCOPY (EGD) WITH PROPOFOL ;  Surgeon: Albertus Gordy HERO, MD;  Location: [facility] ENDOSCOPY;  Service: Gastroenterology;  Laterality: N/A;   HOT HEMOSTASIS N/A 07/19/2023   Procedure: HOT HEMOSTASIS (ARGON PLASMA COAGULATION/BICAP);  Surgeon: Albertus Gordy HERO, MD;  Location: [facility] ENDOSCOPY;  Service: Gastroenterology;  Laterality: N/A;  stomach   I & D EXTREMITY Left 09/03/2013   Procedure: IRRIGATION AND DEBRIDEMENT EXTREMITY;  Surgeon: Kay Ozell Cummins, MD;  Location: [facility] OR;  Service: Orthopedics;  Laterality: Left;   I & D EXTREMITY Left 09/11/2013   Procedure: LEFT FOOT IRRIGATION AND DEBRIDEMENT;  Surgeon: Kay Ozell Cummins, MD;  Location: [facility] OR;  Service: Orthopedics;  Laterality: Left;   INSERTION OF DIALYSIS CATHETER  12/11/2019   Procedure: Insertion Of Dialysis Catheter;  Surgeon: Oris Krystal FALCON, MD;  Location: [facility] OR;  Service: Vascular;;   INSERTION OF DIALYSIS CATHETER Right 07/11/2022   Procedure: INSERTION OF TUNNELED DIALYSIS CATHETER;  Surgeon: Eliza Lonni RAMAN, MD;  Location: [facility] OR;  Service: Vascular;  Laterality: Right;   LEFT HEART CATH AND CORONARY ANGIOGRAPHY N/A 12/13/2019   Procedure: LEFT HEART CATH AND CORONARY ANGIOGRAPHY;  Surgeon: Dann Candyce RAMAN, MD;  Location: [facility] INVASIVE CV LAB;  Service: Cardiovascular;  Laterality: N/A;   PERIPHERAL VASCULAR BALLOON ANGIOPLASTY Left 08/16/2023   Procedure: PERIPHERAL VASCULAR BALLOON ANGIOPLASTY;  Surgeon: Melia Lynwood ORN, MD;  Location: [facility] INVASIVE CV LAB;  Service: Cardiovascular;  Laterality: Left;   REVISON OF ARTERIOVENOUS FISTULA Left 07/11/2022   Procedure:  REVISON OF LEFT ARM ARTERIOVENOUS FISTULA;  Surgeon: Eliza Lonni RAMAN, MD;  Location: [facility] OR;  Service: Vascular;  Laterality: Left;   RIGHT HEART CATH N/A 12/13/2019   Procedure: RIGHT HEART CATH;  Surgeon: Dann Candyce RAMAN, MD;  Location: [facility] INVASIVE CV LAB;  Service: Cardiovascular;  Laterality: N/A;    Social History   Socioeconomic History   Marital status: Single    Spouse name: Not on file   Number of  children: Not on file   Years of education: Not on file   Highest education level: Not on file  Occupational History   Not on file  Tobacco Use   Smoking status: Never   Smokeless tobacco: Never  Vaping Use   Vaping status: Never Used  Substance and Sexual Activity   Alcohol use: No   Drug use: No   Sexual activity: Not on file  Other Topics Concern   Not on file  Social History Narrative   Lives in [facility]   Social Drivers of Health   Financial Resource Strain: Not on file  Food Insecurity: Patient Declined (03/23/2024)   Hunger Vital Sign    Worried About Running Out of Food in the Last Year: Patient declined    Ran Out of Food in the Last Year: Patient declined  Transportation Needs: Patient Unable To Answer (03/25/2024)   PRAPARE - Administrator, Civil Service (Medical): Patient unable to answer    Lack of Transportation (Non-Medical): Patient unable to answer  Physical Activity: Not on file  Stress: Not on file  Social Connections: Patient Unable To Answer (03/25/2024)   Social Connection and Isolation Panel    Frequency of Communication with Friends and Family: Patient unable to answer    Frequency of Social Gatherings with Friends and Family: Patient unable to answer    Attends Religious Services: Patient unable to answer    Active Member of Clubs or Organizations: Patient unable to answer    Attends Banker Meetings: Patient unable to answer    Marital Status: Patient unable to answer  Intimate Partner Violence: Patient Declined (03/23/2024)   Humiliation, Afraid, Rape, and Kick questionnaire    Fear of Current or Ex-Partner: Patient declined    Emotionally Abused: Patient declined    Physically Abused: Patient declined    Sexually Abused: Patient declined   Family History  Problem Relation Age of Onset   Diabetes type II Mother    Dementia Mother    Heart disease Father     Current Outpatient Medications  Medication Sig  Dispense Refill   Amino Acids-Protein Hydrolys (PRO-STAT) LIQD Take 30 mLs by mouth 2 (two) times daily.     amiodarone  (PACERONE ) 200 MG tablet Take 1 tablet (200 mg total) by mouth daily. 30 tablet 0   apixaban  (ELIQUIS ) 2.5 MG TABS tablet Take 1 tablet (2.5 mg total) by mouth 2 (two) times daily.     atorvastatin  (LIPITOR) 20 MG tablet Take 1 tablet (20 mg total) by mouth daily. (Patient taking differently: Take 20 mg by mouth at bedtime.)     bethanechol  (URECHOLINE ) 5 MG tablet Take 5 mg by mouth 3 (three) times daily.     ceFEPime  (MAXIPIME ) IVPB Inject 2 g into the vein every Monday, Wednesday, and Friday with hemodialysis. Indication:  R-foot osteo Last Day of Therapy:  With HD session on 05/01/24 Labs - Once weekly:  CBC/D and BMP, ESR and CRP Method of administration: Per HD protocol -  to be given at the paitent's HD center     clotrimazole -betamethasone (LOTRISONE) cream Apply 1 Application topically See admin instructions. Apply topically to rash every day shift.     cyanocobalamin  1000 MCG tablet Take 1,000 mcg by mouth daily.     ferric citrate  (AURYXIA ) 1 GM 210 MG(Fe) tablet Take 210 mg by mouth with breakfast, with lunch, and with evening meal.     folic acid  (FOLVITE ) 1 MG tablet Take 1 mg by mouth daily.     guaiFENesin  (ROBITUSSIN) 100 MG/5ML liquid Take 5 mLs by mouth every 6 (six) hours as needed for cough or to loosen phlegm.     hydrocortisone  cream 1 % Apply 1 Application topically daily as needed (rash).     liver oil-zinc oxide (DESITIN) 40 % ointment Apply 1 Application topically in the morning and at bedtime. Apply topically to buttocks and sacrum area every day and night shift.     metoprolol  tartrate (LOPRESSOR ) 25 MG tablet Take 12.5 mg by mouth See admin instructions. Give 1/2 tablet (12.5mg ) by mouth three days a week on Tuesday, Thursday, Saturday.     OXYGEN Inhale 2 L/min into the lungs as needed (shortness of breath or maintain sats > 90%).     pantoprazole   (PROTONIX ) 40 MG tablet Take 1 tablet (40 mg total) by mouth 2 (two) times daily before a meal.     promethazine  (PHENERGAN ) 25 MG/ML injection Inject 25 mg into the muscle every 4 (four) hours as needed for nausea or vomiting.     tamsulosin  (FLOMAX ) 0.4 MG CAPS capsule Take 0.4 mg by mouth See admin instructions. Take one tablet by mouth 3 times a week on Monday, Wednesday and Fridays     vancomycin  IVPB Inject 500 mg into the vein every Monday, Wednesday, and Friday with hemodialysis. Indication:  R-foot osteo Last Day of Therapy:  With HD on 05/01/24 Labs - Once weekly:  CBC/D, BMP, and vancomycin  trough, ESR, CRP Method of administration: Per HD protocol - to be given at the patient's HD center     No current facility-administered medications for this visit.    Allergies  Allergen Reactions   Ultram  [Tramadol ] Nausea And Vomiting     REVIEW OF SYSTEMS:  [X]  denotes positive finding, [ ]  denotes negative finding Cardiac  Comments:  Chest pain or chest pressure:    Shortness of breath upon exertion:    Short of breath when lying flat:    Irregular heart rhythm:        Vascular    Pain in calf, thigh, or hip brought on by ambulation:    Pain in feet at night that wakes you up from your sleep:     Blood clot in your veins:    Leg swelling:         Pulmonary    Oxygen at home:    Productive cough:     Wheezing:         Neurologic    Sudden weakness in arms or legs:     Sudden numbness in arms or legs:     Sudden onset of difficulty speaking or slurred speech:    Temporary loss of vision in one eye:     Problems with dizziness:         Gastrointestinal    Blood in stool:     Vomited blood:         Genitourinary    Burning when urinating:     Blood  in urine:        Psychiatric    Major depression:         Hematologic    Bleeding problems:    Problems with blood clotting too easily:        Skin    Rashes or ulcers:        Constitutional    Fever or chills:       PHYSICAL EXAMINATION:  There were no vitals filed for this visit.  General:  WDWN in NAD; vital signs documented above Gait: Not observed HENT: WNL, normocephalic Pulmonary: normal non-labored breathing , without Rales, rhonchi,  wheezing Cardiac: regular HR,  Abdomen: soft, NT, no masses Skin: without rashes Vascular Exam/Pulses:  Right Left  Radial 2+ (normal) 1+ (weak)                       Extremities: without ischemic changes, without Gangrene , without cellulitis; without open wounds;  Fistula with thrill   Musculoskeletal: no muscle wasting or atrophy, amp L leg AKA Neurologic: A&O X 3;  No focal weakness or paresthesias are detected Psychiatric:  The pt has Normal affect.    ASSESSMENT/PLAN:: 68 y.o. male presenting with concern for skin over the left AVF. This was previously revised last yer with Dr. Eliza. On exam, there appears to be blanching with some scarring, but there is healthy dermis.  No need for revision at this time.   I called and spoke to Dr. Jerrye regarding the above.    Fonda FORBES Rim, MD [facility] [phone] Total time of patient care including pre-visit research, consultation, and documentation greater than 30 minutes

## 2024-04-04 ENCOUNTER — Ambulatory Visit (HOSPITAL_COMMUNITY)
Admission: RE | Admit: 2024-04-04 | Discharge: 2024-04-04 | Disposition: A | Discharge status: Home / Self Care | Source: Ambulatory Visit | Attending: Vascular Surgery | Admitting: Vascular Surgery

## 2024-04-04 ENCOUNTER — Ambulatory Visit (INDEPENDENT_AMBULATORY_CARE_PROVIDER_SITE_OTHER): Admitting: Vascular Surgery

## 2024-04-04 ENCOUNTER — Encounter: Payer: Self-pay | Admitting: Vascular Surgery

## 2024-04-04 VITALS — BP 96/54 | HR 91 | Temp 98.7°F | Resp 20 | Ht 63.0 in | Wt 100.0 lb

## 2024-04-04 DIAGNOSIS — Z992 Dependence on renal dialysis: Secondary | ICD-10-CM | POA: Diagnosis present

## 2024-04-04 DIAGNOSIS — N186 End stage renal disease: Secondary | ICD-10-CM | POA: Diagnosis present

## 2024-04-04 DIAGNOSIS — T82590A Other mechanical complication of surgically created arteriovenous fistula, initial encounter: Secondary | ICD-10-CM

## 2024-04-04 DIAGNOSIS — M79603 Pain in arm, unspecified: Secondary | ICD-10-CM | POA: Insufficient documentation

## 2024-04-05 ENCOUNTER — Telehealth: Payer: Self-pay

## 2024-04-05 NOTE — Telephone Encounter (Signed)
 Attempted phone call to [facility] in order to schedule surgery for patient.  No answer/No VM from scheduling department. Request will be faxed to [phone].

## 2024-04-09 ENCOUNTER — Other Ambulatory Visit: Payer: Self-pay

## 2024-04-09 DIAGNOSIS — N186 End stage renal disease: Secondary | ICD-10-CM

## 2024-04-22 ENCOUNTER — Encounter (HOSPITAL_COMMUNITY): Payer: Self-pay | Admitting: Vascular Surgery

## 2024-04-22 NOTE — Progress Notes (Signed)
 SDW INSTRUCTIONS given:   Your procedure is scheduled on April 23, 2024.             Report to [facility] Main Entrance A at 7:30 A.M., and check in at the Admitting office.             Call this number if you have problems the morning of surgery:             [phone]               Remember:             Do not eat or drink after midnight the night before your surgery                              Take these medicines the morning of surgery with A SIP OF WATER   amiodarone  (PACERONE )  bethanechol  (URECHOLINE )  metoprolol  tartrate (LOPRESSOR )  pantoprazole  (PROTONIX )  promethazine  (PHENERGAN )    As of today, STOP taking any Aspirin  (unless otherwise instructed by your surgeon) Aleve, Naproxen, Ibuprofen, Motrin, Advil, Goody's, BC's, all herbal medications, fish oil, and all vitamins.                       Do not wear jewelry, make up, or nail polish            Do not wear lotions, powders, perfumes/colognes, or deodorant.            Do not shave 48 hours prior to surgery.  Men may shave face and neck.            Do not bring valuables to the hospital.            [facility] is not responsible for any belongings or valuables.   Do NOT Smoke (Tobacco/Vaping) 24 hours prior to your procedure If you use a CPAP at night, you may bring all equipment for your overnight stay.   Contacts, glasses, dentures or bridgework may not be worn into surgery.      For patients admitted to the hospital, discharge time will be determined by your treatment team.   Patients discharged the day of surgery will not be allowed to drive home, and someone needs to stay with them for 24 hours.       Special instructions:   [facility]- Preparing For Surgery   Before surgery, you can play an important role. Because skin is not sterile, your skin needs to be as free of germs as possible. You can reduce the number of germs on your skin by washing with CHG (chlorahexidine gluconate) Soap before  surgery.  CHG is an antiseptic cleaner which kills germs and bonds with the skin to continue killing germs even after washing.     Oral Hygiene is also important to reduce your risk of infection.  Remember - BRUSH YOUR TEETH THE MORNING OF SURGERY WITH YOUR REGULAR TOOTHPASTE   Please do not use if you have an allergy to CHG or antibacterial soaps. If your skin becomes reddened/irritated stop using the CHG.  Do not shave (including legs and underarms) for at least 48 hours prior to first CHG shower. It is OK to shave your face.   Please follow these instructions carefully.              Shower the NIGHT BEFORE SURGERY and the MORNING OF SURGERY with DIAL  Soap.  Pat yourself dry with a CLEAN TOWEL.   Wear CLEAN PAJAMAS to bed the night before surgery   Place CLEAN SHEETS on your bed the night of your first shower and DO NOT SLEEP WITH PETS.     Day of Surgery: Please shower morning of surgery  Wear Clean/Comfortable clothing the morning of surgery Do not apply any deodorants/lotions.   Remember to brush your teeth WITH YOUR REGULAR TOOTHPASTE.   Questions were answered. Patient verbalized understanding of instructions.

## 2024-04-22 NOTE — Progress Notes (Signed)
 Anesthesia Chart Review: Same day workup  68 year old male with pertinent history including ESRD on HD MWF, GERD on PPI, A-fib on Eliquis , moderate CAD by cath 11/2019 (medical therapy recommended), HTN, combined heart failure, PVD s/p left BKA and multiple right toe amputations.  Echo 08/25/2023 showed LVEF 40 to 45%, global hypokinesis, normal RV, no significant valvular abnormalities.  Recent admission 8/22 through 03/25/2024 after dialysis center reported fever and tachycardia.  He tested positive for COVID-19.  Chest xray with no active disease. Labs with stable potassium bicarb creatinine 1.8 CBC mild leukocytosis chronic anemia. X-ray right foot questionable cortical erosion destruction of the fourth digit phalanges Limited evaluation due to overlapping osseous structures and soft tissue. Seen by Dr. Janit from podiatry advised conservative management-ID was consulted: recommending vancomycin  and cefepime  with PRN x 6 weeks Flagyl  twice daily, wound care and follow-up with [facility] 9/23.  At discharge he was noted to be stable from respiratory standpoint and largely asymptomatic.  Last dose of Eliquis  reported 04/18/2024.  Patient will need day of surgery labs and evaluation  EKG 03/22/2024: Ectopic atrial tachycardia, unifocal.  Rate 124. LAD, consider LAFB or inferior infarct. No significant change since last tracing  TTE 08/25/2023:  1. Left ventricular ejection fraction, by estimation, is 40 to 45%. The  left ventricle has mildly decreased function. The left ventricle  demonstrates global hypokinesis.   2. Right ventricular systolic function is normal. The right ventricular  size is normal.   3. The mitral valve is normal in structure. Trivial mitral valve  regurgitation. No evidence of mitral stenosis.   4. The aortic valve is tricuspid. Aortic valve regurgitation is trivial.      Lynwood Geofm RIGGERS [facility] Short Stay Center/Anesthesiology Phone [phone] 04/22/2024 3:01 PM

## 2024-04-22 NOTE — Anesthesia Preprocedure Evaluation (Signed)
 Anesthesia Evaluation  Patient identified by MRN, date of birth, ID band Patient awake    Reviewed: Allergy & Precautions, NPO status , Patient's Chart, lab work & pertinent test results  History of Anesthesia Complications Negative for: history of anesthetic complications  Airway Mallampati: II  TM Distance: >3 FB Neck ROM: Full    Dental  (+) Dental Advisory Given   Pulmonary neg shortness of breath, neg sleep apnea, neg COPD, neg recent URI   breath sounds clear to auscultation       Cardiovascular hypertension, + Past MI, + Peripheral Vascular Disease and +CHF   Rhythm:Regular     Neuro/Psych neg Seizures  Neuromuscular disease    GI/Hepatic negative GI ROS,,,(+) Hepatitis -  Endo/Other  diabetes    Renal/GU Renal disease     Musculoskeletal   Abdominal   Peds  Hematology  (+) Blood dyscrasia, anemia   Anesthesia Other Findings   Reproductive/Obstetrics                              Anesthesia Physical Anesthesia Plan  ASA: 3  Anesthesia Plan: General   Post-op Pain Management:    Induction: Intravenous  PONV Risk Score and Plan: 2 and Ondansetron  and Treatment may vary due to age or medical condition  Airway Management Planned: Oral ETT  Additional Equipment: None  Intra-op Plan:   Post-operative Plan: Extubation in OR  Informed Consent: I have reviewed the patients History and Physical, chart, labs and discussed the procedure including the risks, benefits and alternatives for the proposed anesthesia with the patient or authorized representative who has indicated his/her understanding and acceptance.     Dental advisory given  Plan Discussed with: CRNA  Anesthesia Plan Comments: (PAT note by Lynwood Hope, PA-C: 68 year old male with pertinent history including ESRD on HD MWF, GERD on PPI, A-fib on Eliquis , moderate CAD by cath 11/2019 (medical therapy recommended),  HTN, combined heart failure, PVD s/p left BKA and multiple right toe amputations.  Echo 08/25/2023 showed LVEF 40 to 45%, global hypokinesis, normal RV, no significant valvular abnormalities.  Recent admission 8/22 through 03/25/2024 after dialysis center reported fever and tachycardia.  He tested positive for COVID-19.  Chest xray with no active disease. Labs with stable potassium bicarb creatinine 1.8 CBC mild leukocytosis chronic anemia. X-ray right foot questionable cortical erosion destruction of the fourth digit phalanges Limited evaluation due to overlapping osseous structures and soft tissue. Seen by Dr. Janit from podiatry advised conservative management-ID was consulted: recommending vancomycin  and cefepime  with PRN x 6 weeks Flagyl  twice daily, wound care and follow-up with [facility] 9/23.  At discharge he was noted to be stable from respiratory standpoint and largely asymptomatic.  Last dose of Eliquis  reported 04/18/2024.  Patient will need day of surgery labs and evaluation  EKG 03/22/2024: Ectopic atrial tachycardia, unifocal.  Rate 124. LAD, consider LAFB or inferior infarct. No significant change since last tracing  TTE 08/25/2023:  1. Left ventricular ejection fraction, by estimation, is 40 to 45%. The  left ventricle has mildly decreased function. The left ventricle  demonstrates global hypokinesis.   2. Right ventricular systolic function is normal. The right ventricular  size is normal.   3. The mitral valve is normal in structure. Trivial mitral valve  regurgitation. No evidence of mitral stenosis.   4. The aortic valve is tricuspid. Aortic valve regurgitation is trivial.    )  Anesthesia Quick Evaluation

## 2024-04-22 NOTE — Progress Notes (Signed)
 PCP -  Cardiologist - Loni Soyla LABOR, MD   PPM/ICD - denies Device Orders - n/a Rep Notified - n/a  Chest x-ray - 03-22-24 EKG - 03-25-24 Stress Test -  ECHO - 08-25-23 Cardiac Cath - 12-13-19  CPAP -   GLP-1 -  Fasting Blood Sugar -  Checks Blood Sugar /day  Blood Thinner Instructions: apixaban  (ELIQUIS ) Per nurse at facility last dose 04-18-24 Aspirin  Instructions:   ERAS Protcol - NPO  COVID TEST- n./a  Anesthesia review: yes. Hx Hx of CHF, Htn, Esrd  Patient verbally denies any shortness of breath, fever, cough and chest pain during phone call   -------------  SDW INSTRUCTIONS given:  Your procedure is scheduled on April 23, 2024.  Report to [facility] Main Entrance A at 7:30 A.M., and check in at the Admitting office.  Call this number if you have problems the morning of surgery:  [phone]   Remember:  Do not eat or drink after midnight the night before your surgery      Take these medicines the morning of surgery with A SIP OF WATER   amiodarone  (PACERONE )  bethanechol  (URECHOLINE )  metoprolol  tartrate (LOPRESSOR )  pantoprazole  (PROTONIX )  promethazine  (PHENERGAN )   As of today, STOP taking any Aspirin  (unless otherwise instructed by your surgeon) Aleve, Naproxen, Ibuprofen, Motrin, Advil, Goody's, BC's, all herbal medications, fish oil, and all vitamins.                      Do not wear jewelry, make up, or nail polish            Do not wear lotions, powders, perfumes/colognes, or deodorant.            Do not shave 48 hours prior to surgery.  Men may shave face and neck.            Do not bring valuables to the hospital.            [facility] is not responsible for any belongings or valuables.  Do NOT Smoke (Tobacco/Vaping) 24 hours prior to your procedure If you use a CPAP at night, you may bring all equipment for your overnight stay.   Contacts, glasses, dentures or bridgework may not be worn into surgery.      For patients admitted  to the hospital, discharge time will be determined by your treatment team.   Patients discharged the day of surgery will not be allowed to drive home, and someone needs to stay with them for 24 hours.    Special instructions:   [place]- Preparing For Surgery  Before surgery, you can play an important role. Because skin is not sterile, your skin needs to be as free of germs as possible. You can reduce the number of germs on your skin by washing with CHG (chlorahexidine gluconate) Soap before surgery.  CHG is an antiseptic cleaner which kills germs and bonds with the skin to continue killing germs even after washing.    Oral Hygiene is also important to reduce your risk of infection.  Remember - BRUSH YOUR TEETH THE MORNING OF SURGERY WITH YOUR REGULAR TOOTHPASTE  Please do not use if you have an allergy to CHG or antibacterial soaps. If your skin becomes reddened/irritated stop using the CHG.  Do not shave (including legs and underarms) for at least 48 hours prior to first CHG shower. It is OK to shave your face.  Please follow these instructions carefully.   Shower the  NIGHT BEFORE SURGERY and the MORNING OF SURGERY with DIAL  Soap.   Pat yourself dry with a CLEAN TOWEL.  Wear CLEAN PAJAMAS to bed the night before surgery  Place CLEAN SHEETS on your bed the night of your first shower and DO NOT SLEEP WITH PETS.   Day of Surgery: Please shower morning of surgery  Wear Clean/Comfortable clothing the morning of surgery Do not apply any deodorants/lotions.   Remember to brush your teeth WITH YOUR REGULAR TOOTHPASTE.   Questions were answered. Patient verbalized understanding of instructions.

## 2024-04-23 ENCOUNTER — Inpatient Hospital Stay: Payer: Self-pay | Admitting: Infectious Diseases

## 2024-04-23 ENCOUNTER — Encounter (HOSPITAL_COMMUNITY)
Admission: RE | Disposition: A | Discharge status: Home / Self Care | Payer: Self-pay | Source: Home / Self Care | Attending: Vascular Surgery

## 2024-04-23 ENCOUNTER — Other Ambulatory Visit: Payer: Self-pay

## 2024-04-23 ENCOUNTER — Ambulatory Visit (HOSPITAL_COMMUNITY): Payer: Self-pay | Admitting: Physician Assistant

## 2024-04-23 ENCOUNTER — Ambulatory Visit (HOSPITAL_COMMUNITY)
Admission: RE | Admit: 2024-04-23 | Discharge: 2024-04-23 | Disposition: A | Discharge status: Home / Self Care | Attending: Vascular Surgery | Admitting: Vascular Surgery

## 2024-04-23 ENCOUNTER — Other Ambulatory Visit (HOSPITAL_COMMUNITY): Payer: Self-pay

## 2024-04-23 DIAGNOSIS — Z833 Family history of diabetes mellitus: Secondary | ICD-10-CM | POA: Diagnosis not present

## 2024-04-23 DIAGNOSIS — N186 End stage renal disease: Secondary | ICD-10-CM | POA: Diagnosis not present

## 2024-04-23 DIAGNOSIS — E1151 Type 2 diabetes mellitus with diabetic peripheral angiopathy without gangrene: Secondary | ICD-10-CM | POA: Insufficient documentation

## 2024-04-23 DIAGNOSIS — T82898A Other specified complication of vascular prosthetic devices, implants and grafts, initial encounter: Secondary | ICD-10-CM | POA: Insufficient documentation

## 2024-04-23 DIAGNOSIS — I252 Old myocardial infarction: Secondary | ICD-10-CM | POA: Diagnosis not present

## 2024-04-23 DIAGNOSIS — I728 Aneurysm of other specified arteries: Secondary | ICD-10-CM | POA: Insufficient documentation

## 2024-04-23 DIAGNOSIS — Z89519 Acquired absence of unspecified leg below knee: Secondary | ICD-10-CM | POA: Diagnosis not present

## 2024-04-23 DIAGNOSIS — L98499 Non-pressure chronic ulcer of skin of other sites with unspecified severity: Secondary | ICD-10-CM | POA: Insufficient documentation

## 2024-04-23 DIAGNOSIS — X58XXXA Exposure to other specified factors, initial encounter: Secondary | ICD-10-CM | POA: Insufficient documentation

## 2024-04-23 DIAGNOSIS — I120 Hypertensive chronic kidney disease with stage 5 chronic kidney disease or end stage renal disease: Secondary | ICD-10-CM | POA: Diagnosis not present

## 2024-04-23 DIAGNOSIS — Z992 Dependence on renal dialysis: Secondary | ICD-10-CM | POA: Insufficient documentation

## 2024-04-23 DIAGNOSIS — D631 Anemia in chronic kidney disease: Secondary | ICD-10-CM | POA: Insufficient documentation

## 2024-04-23 DIAGNOSIS — E785 Hyperlipidemia, unspecified: Secondary | ICD-10-CM | POA: Diagnosis not present

## 2024-04-23 DIAGNOSIS — I509 Heart failure, unspecified: Secondary | ICD-10-CM | POA: Diagnosis not present

## 2024-04-23 DIAGNOSIS — I132 Hypertensive heart and chronic kidney disease with heart failure and with stage 5 chronic kidney disease, or end stage renal disease: Secondary | ICD-10-CM | POA: Insufficient documentation

## 2024-04-23 DIAGNOSIS — E1122 Type 2 diabetes mellitus with diabetic chronic kidney disease: Secondary | ICD-10-CM

## 2024-04-23 HISTORY — PX: ARTERY REPAIR: SHX559

## 2024-04-23 HISTORY — PX: REVISON OF ARTERIOVENOUS FISTULA: SHX6074

## 2024-04-23 LAB — PREPARE RBC (CROSSMATCH)

## 2024-04-23 LAB — POCT I-STAT, CHEM 8
BUN: 30 mg/dL — ABNORMAL HIGH (ref 8–23)
Calcium, Ion: 0.97 mmol/L — ABNORMAL LOW (ref 1.15–1.40)
Chloride: 97 mmol/L — ABNORMAL LOW (ref 98–111)
Creatinine, Ser: 3.5 mg/dL — ABNORMAL HIGH (ref 0.61–1.24)
Glucose, Bld: 128 mg/dL — ABNORMAL HIGH (ref 70–99)
HCT: 23 % — ABNORMAL LOW (ref 39.0–52.0)
Hemoglobin: 7.8 g/dL — ABNORMAL LOW (ref 13.0–17.0)
Potassium: 4.7 mmol/L (ref 3.5–5.1)
Sodium: 138 mmol/L (ref 135–145)
TCO2: 34 mmol/L — ABNORMAL HIGH (ref 22–32)

## 2024-04-23 LAB — GLUCOSE, CAPILLARY
Glucose-Capillary: 121 mg/dL — ABNORMAL HIGH (ref 70–99)
Glucose-Capillary: 132 mg/dL — ABNORMAL HIGH (ref 70–99)

## 2024-04-23 SURGERY — REVISON OF ARTERIOVENOUS FISTULA
Anesthesia: General | Site: Arm Upper | Laterality: Left

## 2024-04-23 MED ORDER — HEPARIN SODIUM (PORCINE) 1000 UNIT/ML IJ SOLN
INTRAMUSCULAR | Status: AC
  Filled 2024-04-23: qty 1

## 2024-04-23 MED ORDER — OXYCODONE HCL 5 MG PO TABS
5.0000 mg | ORAL_TABLET | Freq: Four times a day (QID) | ORAL | 0 refills | Status: DC | PRN
  Filled 2024-04-23: qty 15, 4d supply, fill #0

## 2024-04-23 MED ORDER — INSULIN ASPART 100 UNIT/ML IJ SOLN: 0.0000 [IU] | INTRAMUSCULAR | Status: DC | PRN

## 2024-04-23 MED ORDER — FENTANYL CITRATE (PF) 250 MCG/5ML IJ SOLN
INTRAMUSCULAR | Status: AC
  Filled 2024-04-23: qty 5

## 2024-04-23 MED ORDER — ALBUMIN HUMAN 5 % IV SOLN
INTRAVENOUS | Status: AC
  Filled 2024-04-23: qty 250

## 2024-04-23 MED ORDER — ROCURONIUM BROMIDE 10 MG/ML (PF) SYRINGE
PREFILLED_SYRINGE | INTRAVENOUS | Status: AC
  Filled 2024-04-23: qty 10

## 2024-04-23 MED ORDER — MIDAZOLAM HCL 2 MG/2ML IJ SOLN
INTRAMUSCULAR | Status: DC | PRN
  Administered 2024-04-23: 1 mg via INTRAVENOUS

## 2024-04-23 MED ORDER — LIDOCAINE 2% (20 MG/ML) 5 ML SYRINGE
INTRAMUSCULAR | Status: DC | PRN
  Administered 2024-04-23: 40 mg via INTRAVENOUS

## 2024-04-23 MED ORDER — CEFAZOLIN SODIUM-DEXTROSE 2-4 GM/100ML-% IV SOLN
2.0000 g | INTRAVENOUS | Status: AC
  Administered 2024-04-23: 2 g via INTRAVENOUS
  Filled 2024-04-23: qty 100

## 2024-04-23 MED ORDER — CHLORHEXIDINE GLUCONATE 4 % EX SOLN: 60.0000 mL | Freq: Once | CUTANEOUS | Status: DC

## 2024-04-23 MED ORDER — HEPARIN 6000 UNIT IRRIGATION SOLUTION
Status: DC | PRN
  Administered 2024-04-23: 1

## 2024-04-23 MED ORDER — HEPARIN 6000 UNIT IRRIGATION SOLUTION
Status: AC
  Filled 2024-04-23: qty 500

## 2024-04-23 MED ORDER — EPHEDRINE SULFATE-NACL 50-0.9 MG/10ML-% IV SOSY
PREFILLED_SYRINGE | INTRAVENOUS | Status: DC | PRN
  Administered 2024-04-23 (×2): 10 mg via INTRAVENOUS

## 2024-04-23 MED ORDER — PHENYLEPHRINE 80 MCG/ML (10ML) SYRINGE FOR IV PUSH (FOR BLOOD PRESSURE SUPPORT)
PREFILLED_SYRINGE | INTRAVENOUS | Status: DC | PRN
  Administered 2024-04-23 (×3): 160 ug via INTRAVENOUS
  Administered 2024-04-23: 80 ug via INTRAVENOUS

## 2024-04-23 MED ORDER — METOPROLOL TARTRATE 12.5 MG HALF TABLET
12.5000 mg | ORAL_TABLET | Freq: Once | ORAL | Status: AC
  Administered 2024-04-23: 12.5 mg via ORAL
  Filled 2024-04-23: qty 1

## 2024-04-23 MED ORDER — ORAL CARE MOUTH RINSE: 15.0000 mL | Freq: Once | OROMUCOSAL | Status: AC

## 2024-04-23 MED ORDER — MIDAZOLAM HCL 2 MG/2ML IJ SOLN
INTRAMUSCULAR | Status: AC
  Filled 2024-04-23: qty 2

## 2024-04-23 MED ORDER — SODIUM CHLORIDE 0.9% IV SOLUTION: Freq: Once | INTRAVENOUS | Status: DC

## 2024-04-23 MED ORDER — PHENYLEPHRINE HCL-NACL 20-0.9 MG/250ML-% IV SOLN
INTRAVENOUS | Status: DC | PRN
  Administered 2024-04-23: 40 ug/min via INTRAVENOUS
  Administered 2024-04-23 (×2): 80 ug via INTRAVENOUS

## 2024-04-23 MED ORDER — APIXABAN 2.5 MG PO TABS: 2.5000 mg | ORAL_TABLET | Freq: Two times a day (BID) | ORAL | Status: DC

## 2024-04-23 MED ORDER — PROPOFOL 10 MG/ML IV BOLUS
INTRAVENOUS | Status: AC
Start: 2024-04-23 — End: 2024-04-23
  Filled 2024-04-23: qty 20

## 2024-04-23 MED ORDER — ONDANSETRON HCL 4 MG/2ML IJ SOLN
INTRAMUSCULAR | Status: DC | PRN
  Administered 2024-04-23: 4 mg via INTRAVENOUS

## 2024-04-23 MED ORDER — ALBUMIN HUMAN 5 % IV SOLN: INTRAVENOUS | Status: DC | PRN

## 2024-04-23 MED ORDER — FENTANYL CITRATE (PF) 250 MCG/5ML IJ SOLN
INTRAMUSCULAR | Status: DC | PRN
  Administered 2024-04-23: 25 ug via INTRAVENOUS
  Administered 2024-04-23: 75 ug via INTRAVENOUS

## 2024-04-23 MED ORDER — HEPARIN SODIUM (PORCINE) 1000 UNIT/ML IJ SOLN
INTRAMUSCULAR | Status: DC | PRN
  Administered 2024-04-23: 2000 [IU] via INTRAVENOUS

## 2024-04-23 MED ORDER — ALBUMIN HUMAN 5 % IV SOLN
12.5000 g | Freq: Once | INTRAVENOUS | Status: AC
  Administered 2024-04-23: 12.5 g via INTRAVENOUS

## 2024-04-23 MED ORDER — LIDOCAINE 2% (20 MG/ML) 5 ML SYRINGE
INTRAMUSCULAR | Status: AC
  Filled 2024-04-23: qty 5

## 2024-04-23 MED ORDER — CHLORHEXIDINE GLUCONATE 0.12 % MT SOLN
15.0000 mL | Freq: Once | OROMUCOSAL | Status: AC
  Administered 2024-04-23: 15 mL via OROMUCOSAL
  Filled 2024-04-23: qty 15

## 2024-04-23 MED ORDER — 0.9 % SODIUM CHLORIDE (POUR BTL) OPTIME
TOPICAL | Status: DC | PRN
  Administered 2024-04-23: 1000 mL

## 2024-04-23 MED ORDER — PHENYLEPHRINE 80 MCG/ML (10ML) SYRINGE FOR IV PUSH (FOR BLOOD PRESSURE SUPPORT)
PREFILLED_SYRINGE | INTRAVENOUS | Status: AC
  Filled 2024-04-23: qty 10

## 2024-04-23 MED ORDER — SUGAMMADEX SODIUM 200 MG/2ML IV SOLN
INTRAVENOUS | Status: DC | PRN
  Administered 2024-04-23: 200 mg via INTRAVENOUS

## 2024-04-23 MED ORDER — EPHEDRINE 5 MG/ML INJ
INTRAVENOUS | Status: AC
  Filled 2024-04-23: qty 5

## 2024-04-23 MED ORDER — ONDANSETRON HCL 4 MG/2ML IJ SOLN
INTRAMUSCULAR | Status: AC
  Filled 2024-04-23: qty 2

## 2024-04-23 MED ORDER — PROPOFOL 10 MG/ML IV BOLUS
INTRAVENOUS | Status: DC | PRN
  Administered 2024-04-23: 90 mg via INTRAVENOUS

## 2024-04-23 MED ORDER — ROCURONIUM BROMIDE 10 MG/ML (PF) SYRINGE
PREFILLED_SYRINGE | INTRAVENOUS | Status: DC | PRN
  Administered 2024-04-23: 40 mg via INTRAVENOUS

## 2024-04-23 MED ORDER — SODIUM CHLORIDE 0.9 % IV SOLN: INTRAVENOUS | Status: DC

## 2024-04-23 SURGICAL SUPPLY — 36 items
ARMBAND PINK RESTRICT EXTREMIT (MISCELLANEOUS) ×1 IMPLANT
BAG COUNTER SPONGE SURGICOUNT (BAG) ×1 IMPLANT
BNDG COMPR ESMARK 4X3 LF (GAUZE/BANDAGES/DRESSINGS) IMPLANT
BNDG ELASTIC 4INX 5YD STR LF (GAUZE/BANDAGES/DRESSINGS) IMPLANT
BNDG ELASTIC 4X5.8 VLCR STR LF (GAUZE/BANDAGES/DRESSINGS) ×1 IMPLANT
CANISTER SUCTION 3000ML PPV (SUCTIONS) ×1 IMPLANT
CLIP TI MEDIUM 24 (CLIP) ×1 IMPLANT
CLIP TI MEDIUM 6 (CLIP) ×2 IMPLANT
CLIP TI WIDE RED SMALL 24 (CLIP) ×1 IMPLANT
CLIP TI WIDE RED SMALL 6 (CLIP) ×1 IMPLANT
COVER PROBE W GEL 5X96 (DRAPES) IMPLANT
CUFF TOURN SGL QUICK 18X4 (TOURNIQUET CUFF) IMPLANT
CUFF TRNQT CYL 24X4X16.5-23 (TOURNIQUET CUFF) IMPLANT
DERMABOND ADVANCED .7 DNX12 (GAUZE/BANDAGES/DRESSINGS) ×1 IMPLANT
ELECTRODE REM PT RTRN 9FT ADLT (ELECTROSURGICAL) ×1 IMPLANT
GAUZE SPONGE 4X4 12PLY STRL (GAUZE/BANDAGES/DRESSINGS) IMPLANT
GLOVE BIOGEL PI IND STRL 8 (GLOVE) ×1 IMPLANT
GOWN STRL REUS W/ TWL LRG LVL3 (GOWN DISPOSABLE) ×2 IMPLANT
GOWN STRL REUS W/TWL 2XL LVL3 (GOWN DISPOSABLE) ×2 IMPLANT
KIT BASIN OR (CUSTOM PROCEDURE TRAY) ×1 IMPLANT
KIT TURNOVER KIT B (KITS) ×1 IMPLANT
NS IRRIG 1000ML POUR BTL (IV SOLUTION) ×1 IMPLANT
PACK CV ACCESS (CUSTOM PROCEDURE TRAY) ×1 IMPLANT
PAD ARMBOARD POSITIONER FOAM (MISCELLANEOUS) ×2 IMPLANT
SLING ARM FOAM STRAP LRG (SOFTGOODS) IMPLANT
SPIKE FLUID TRANSFER (MISCELLANEOUS) ×1 IMPLANT
STAPLER SKIN PROX 35W (STAPLE) IMPLANT
SUT MNCRL AB 4-0 PS2 18 (SUTURE) ×1 IMPLANT
SUT PROLENE 5 0 C 1 24 (SUTURE) IMPLANT
SUT PROLENE 6 0 BV (SUTURE) ×1 IMPLANT
SUT PROLENE 7 0 BV 1 (SUTURE) IMPLANT
SUT SILK 3 0 SH CR/8 (SUTURE) ×1 IMPLANT
SUT VIC AB 3-0 SH 27X BRD (SUTURE) ×1 IMPLANT
TOWEL GREEN STERILE (TOWEL DISPOSABLE) ×1 IMPLANT
UNDERPAD 30X36 HEAVY ABSORB (UNDERPADS AND DIAPERS) ×1 IMPLANT
WATER STERILE IRR 1000ML POUR (IV SOLUTION) ×1 IMPLANT

## 2024-04-23 NOTE — Anesthesia Procedure Notes (Signed)
 Procedure Name: Intubation Date/Time: 04/23/2024 9:33 AM  Performed by: Worth Peppers, CRNAPre-anesthesia Checklist: Patient identified, Emergency Drugs available, Suction available and Patient being monitored Patient Re-evaluated:Patient Re-evaluated prior to induction Oxygen Delivery Method: Circle System Utilized Preoxygenation: Pre-oxygenation with 100% oxygen Induction Type: IV induction Ventilation: Mask ventilation without difficulty Laryngoscope Size: Mac and 3 Grade View: Grade I Tube type: Oral Number of attempts: 1 Airway Equipment and Method: Stylet and Oral airway Placement Confirmation: ETT inserted through vocal cords under direct vision, positive ETCO2 and breath sounds checked- equal and bilateral Secured at: 22 cm Tube secured with: Tape Dental Injury: Teeth and Oropharynx as per pre-operative assessment

## 2024-04-23 NOTE — Op Note (Signed)
    NAME: [name]    MRN: [id number] DOB: [birth year]    DATE OF OPERATION: 04/23/2024  PREOP DIAGNOSIS:    End-stage renal disease with history of malfunction  POSTOP DIAGNOSIS:    Same  PROCEDURE:    Left arm brachiocephalic fistula revision, aneurysmectomy  SURGEON: Fonda FORBES Rim  ASSIST: Sherrilee Holster, PA  ANESTHESIA: General  EBL: 10 mL  INDICATIONS:    [name] is a 68 y.o. 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  He presents today to to a large ulcer, and risk of spontaneous bleed.  FINDINGS:   Aneurysm and ulceration resected with primary repair  TECHNIQUE:   Patient is brought to the OR laid in supine position.  General anesthesia was induced and patient prepped and draped in standard fashion.  A tourniquet was placed on the left arm but not inflated.  Next, I made an ellipse incision around the left arm brachiocephalic fistula ulceration.  This was taken through the dermis down to the level of the aneurysm.  The aneurysm was dissected free of surrounding tissue.  I used an angled DeBakey clamp to clamp the aneurysm and resected a large portion of the aneurysm as well as the ulcer.  Distally, there was a thrill.  I used a 5-0 Prolene suture and ran the suture back-and-forth for primary repair of the aneurysmectomy site.  The clamp was removed.  The wound bed was irrigated with copious amounts saline close using 3-0 Vicryl with staples at level the skin.  The fistula can continue to be accessed distal to the staple site.  Fonda FORBES Rim, MD [facility] DATE OF DICTATION:   04/23/2024

## 2024-04-23 NOTE — Discharge Instructions (Signed)
   [facility]  Discharge Instructions  AV Fistula or Graft Surgery for Dialysis Access  Please refer to the following instructions for your post-procedure care. Your surgeon or physician assistant will discuss any changes with you.  Activity  You may drive the day following your surgery, if you are comfortable and no longer taking prescription pain medication. Resume full activity as the soreness in your incision resolves.  Bathing/Showering  You may shower after you go home. Keep your incision dry for 48 hours. Do not soak in a bathtub, hot tub, or swim until the incision heals completely. You may not shower if you have a hemodialysis catheter.  Incision Care  Clean your incision with mild soap and water  after 48 hours. Pat the area dry with a clean towel. You do not need a bandage unless otherwise instructed. Do not apply any ointments or creams to your incision. You may have skin glue on your incision. Do not peel it off. It will come off on its own in about one week. Your arm may swell a bit after surgery. To reduce swelling use pillows to elevate your arm so it is above your heart. Your doctor will tell you if you need to lightly wrap your arm with an ACE bandage.  Diet  Resume your normal diet. There are not special food restrictions following this procedure. In order to heal from your surgery, it is CRITICAL to get adequate nutrition. Your body requires vitamins, minerals, and protein. Vegetables are the best source of vitamins and minerals. Vegetables also provide the perfect balance of protein. Processed food has little nutritional value, so try to avoid this.  Medications  Resume taking all of your medications. If your incision is causing pain, you may take over-the counter pain relievers such as acetaminophen  (Tylenol ). If you were prescribed a stronger pain medication, please be aware these medications can cause nausea and constipation. Prevent  nausea by taking the medication with a snack or meal. Avoid constipation by drinking plenty of fluids and eating foods with high amount of fiber, such as fruits, vegetables, and grains.  Do not take Tylenol  if you are taking prescription pain medications.  Follow up Your surgeon may want to see you in the office following your access surgery. If so, this will be arranged at the time of your surgery.  Please call us  immediately for any of the following conditions:  Increased pain, redness, drainage (pus) from your incision site Fever of 101 degrees or higher Severe or worsening pain at your incision site Hand pain or numbness.  Reduce your risk of vascular disease:  Stop smoking. If you would like help, call [facility] at 1-800-QUIT-NOW (701 834 3504) or [place] at [phone]  Manage your cholesterol Maintain a desired weight Control your diabetes Keep your blood pressure down  Dialysis  It will take several weeks to several months for your new dialysis access to be ready for use. Your surgeon will determine when it is okay to use it. Your nephrologist will continue to direct your dialysis. You can continue to use your Permcath until your new access is ready for use.   04/23/2024 [name] [id number] [birth year]  Surgeon(s): Lanis Fonda BRAVO, MD  Procedure(s): REVISION OF ARTERIOVENOUS FISTULA PLICATION OF AV FISTULA   May stick graft immediately  x May stick fistula above staple site         If you have any questions, please call the office at [phone].

## 2024-04-23 NOTE — Transfer of Care (Signed)
 Immediate Anesthesia Transfer of Care Note  Patient: [name]  Procedure(s) Performed: REVISON OF ARTERIOVENOUS FISTULA (Left: Arm Upper) PLICATION OF AV FISTULA (Left: Arm Upper)  Patient Location: PACU  Anesthesia Type:General  Level of Consciousness: awake and sedated  Airway & Oxygen Therapy: Patient Spontanous Breathing and Patient connected to face mask oxygen  Post-op Assessment: Report given to RN and Post -op Vital signs reviewed and stable  Post vital signs: Reviewed and stable  Last Vitals:  Vitals Value Taken Time  BP 92/37 04/23/24 10:38  Temp    Pulse 93 04/23/24 10:41  Resp 22 04/23/24 10:41  SpO2 100 % 04/23/24 10:41  Vitals shown include unfiled device data.  Last Pain:  Vitals:   04/23/24 0905  TempSrc:   PainSc: 0-No pain         Complications: No notable events documented.

## 2024-04-23 NOTE — H&P (Signed)
 Office Note   Patient seen and examined in preop holding.  No complaints. No changes to medication history or physical exam since last seen in clinic. After discussing the risks and benefits of left arm fistula revision, [name] elected to proceed.   Fonda FORBES Rim MD   CC:  Fistula malfunction Requesting Provider:  No ref. provider found  HPI: [name] is a 68 y.o. (09-24-1955) male who presents at the request of Dr. Jerrye due to concern for steal syndrome.  Left BC fistula created in 2021 by Dr. Oris Roosevelt was last revised in November of 2023 by my partner Dr. Eliza.   Patient has a nonhealing wound on the left hand prompting the recent referral.  On exam today [name] was doing well. No complaints.  He presented accompanied by his aide in a wheelchair with a Hoyer mat underneath. [name] stated the wound in the webspace between the thumb and index finger occurred 2 to 3 weeks ago due to his wheelchair, and rolling around.  Individuals at the facility think that the wound has improved significantly.    No issues at dialysis. Normal runs, no de-cannulation bleeding.  Denies pain in the left hand.  Denies numbness or tingling.  Also a wound noted on the right heel.     Past Medical History:  Diagnosis Date   CHF (congestive heart failure) (HCC)    Diabetes mellitus without complication (HCC)    PATIENT JUST LEARNED HE WAS DIABETIC   ESRD on hemodialysis (HCC)    TTS at [facility]   Hepatitis    Hyperlipemia    Hypertension    Necrotizing fasciitis (HCC)    Pneumonia    HX OF PNA    Past Surgical History:  Procedure Laterality Date   A/V FISTULAGRAM N/A 08/16/2023   Procedure: A/V Fistulagram;  Surgeon: Melia Lynwood ORN, MD;  Location: [facility];  Service: Cardiovascular;  Laterality: N/A;   AMPUTATION Left 09/11/2013   Procedure: AMPUTATION BELOW KNEE;  Surgeon: Kay Ozell Cummins, MD;  Location: [facility] OR;  Service: Orthopedics;  Laterality: Left;    AMPUTATION Right 12/18/2013   Procedure: RIGHT FOOT 1,2, TOE AMPUTATION  5th toe RAY AMPUTATION;  Surgeon: Kay Ozell Cummins, MD;  Location: [facility] OR;  Service: Orthopedics;  Laterality: Right;   AV FISTULA PLACEMENT Left 12/11/2019   Procedure: LEFT BRACHIOCEPHALIC FISTULA CREATION;  Surgeon: Oris Krystal FALCON, MD;  Location: [facility] OR;  Service: Vascular;  Laterality: Left;   BIOPSY  07/19/2023   Procedure: BIOPSY;  Surgeon: Albertus Gordy HERO, MD;  Location: [facility] ENDOSCOPY;  Service: Gastroenterology;;   CHOLECYSTECTOMY     ESOPHAGOGASTRODUODENOSCOPY (EGD) WITH PROPOFOL  N/A 07/19/2023   Procedure: ESOPHAGOGASTRODUODENOSCOPY (EGD) WITH PROPOFOL ;  Surgeon: Albertus Gordy HERO, MD;  Location: [facility] ENDOSCOPY;  Service: Gastroenterology;  Laterality: N/A;   HOT HEMOSTASIS N/A 07/19/2023   Procedure: HOT HEMOSTASIS (ARGON PLASMA COAGULATION/BICAP);  Surgeon: Albertus Gordy HERO, MD;  Location: [facility] ENDOSCOPY;  Service: Gastroenterology;  Laterality: N/A;  stomach   I & D EXTREMITY Left 09/03/2013   Procedure: IRRIGATION AND DEBRIDEMENT EXTREMITY;  Surgeon: Kay Ozell Cummins, MD;  Location: [facility] OR;  Service: Orthopedics;  Laterality: Left;   I & D EXTREMITY Left 09/11/2013   Procedure: LEFT FOOT IRRIGATION AND DEBRIDEMENT;  Surgeon: Kay Ozell Cummins, MD;  Location: [facility] OR;  Service: Orthopedics;  Laterality: Left;   INSERTION OF DIALYSIS CATHETER  12/11/2019   Procedure: Insertion Of Dialysis Catheter;  Surgeon: Oris Krystal FALCON, MD;  Location: [facility] OR;  Service: Vascular;;   INSERTION OF DIALYSIS CATHETER Right 07/11/2022   Procedure: INSERTION OF TUNNELED DIALYSIS CATHETER;  Surgeon: Eliza Lonni RAMAN, MD;  Location: [facility] OR;  Service: Vascular;  Laterality: Right;   LEFT HEART CATH AND CORONARY ANGIOGRAPHY N/A 12/13/2019   Procedure: LEFT HEART CATH AND CORONARY ANGIOGRAPHY;  Surgeon: Dann Candyce RAMAN, MD;  Location: [facility] INVASIVE CV LAB;  Service: Cardiovascular;  Laterality: N/A;   PERIPHERAL VASCULAR BALLOON ANGIOPLASTY Left  08/16/2023   Procedure: PERIPHERAL VASCULAR BALLOON ANGIOPLASTY;  Surgeon: Melia Lynwood ORN, MD;  Location: [facility];  Service: Cardiovascular;  Laterality: Left;   REVISON OF ARTERIOVENOUS FISTULA Left 07/11/2022   Procedure: REVISON OF LEFT ARM ARTERIOVENOUS FISTULA;  Surgeon: Eliza Lonni RAMAN, MD;  Location: [facility] OR;  Service: Vascular;  Laterality: Left;   RIGHT HEART CATH N/A 12/13/2019   Procedure: RIGHT HEART CATH;  Surgeon: Dann Candyce RAMAN, MD;  Location: [facility] INVASIVE CV LAB;  Service: Cardiovascular;  Laterality: N/A;    Social History   Socioeconomic History   Marital status: Single    Spouse name: Not on file   Number of children: Not on file   Years of education: Not on file   Highest education level: Not on file  Occupational History   Not on file  Tobacco Use   Smoking status: Never   Smokeless tobacco: Never  Vaping Use   Vaping status: Never Used  Substance and Sexual Activity   Alcohol use: No   Drug use: No   Sexual activity: Not on file  Other Topics Concern   Not on file  Social History Narrative   Lives in [facility]   Social Drivers of Health   Financial Resource Strain: Not on file  Food Insecurity: Patient Declined (03/23/2024)   Hunger Vital Sign    Worried About Running Out of Food in the Last Year: Patient declined    Ran Out of Food in the Last Year: Patient declined  Transportation Needs: Patient Unable To Answer (03/25/2024)   PRAPARE - Transportation    Lack of Transportation (Medical): Patient unable to answer    Lack of Transportation (Non-Medical): Patient unable to answer  Physical Activity: Not on file  Stress: Not on file  Social Connections: Patient Unable To Answer (03/25/2024)   Social Connection and Isolation Panel    Frequency of Communication with Friends and Family: Patient unable to answer    Frequency of Social Gatherings with Friends and Family: Patient unable to answer    Attends Religious Services:  Patient unable to answer    Active Member of Clubs or Organizations: Patient unable to answer    Attends Banker Meetings: Patient unable to answer    Marital Status: Patient unable to answer  Intimate Partner Violence: Patient Declined (03/23/2024)   Humiliation, Afraid, Rape, and Kick questionnaire    Fear of Current or Ex-Partner: Patient declined    Emotionally Abused: Patient declined    Physically Abused: Patient declined    Sexually Abused: Patient declined   Family History  Problem Relation Age of Onset   Diabetes type II Mother    Dementia Mother    Heart disease Father     Current Facility-Administered Medications  Medication Dose Route Frequency Provider Last Rate Last Admin   0.9 %  sodium chloride  infusion   Intravenous Continuous Panayiota Larkin E, MD       ceFAZolin  (ANCEF ) IVPB 2g/100 mL premix  2 g  Intravenous 30 min Pre-Op Lanis Fonda BRAVO, MD       chlorhexidine  (HIBICLENS ) 4 % liquid 4 Application  60 mL Topical Once Lanis Fonda BRAVO, MD       chlorhexidine  (PERIDEX ) 0.12 % solution 15 mL  15 mL Mouth/Throat Once Leopoldo Bruckner, MD       Or   Oral care mouth rinse  15 mL Mouth Rinse Once Leopoldo Bruckner, MD       insulin  aspart (novoLOG ) injection 0-7 Units  0-7 Units Subcutaneous Q2H PRN Leopoldo Bruckner, MD       metoprolol  tartrate (LOPRESSOR ) tablet 12.5 mg  12.5 mg Oral Once Leopoldo Bruckner, MD        Allergies  Allergen Reactions   Ultram  [Tramadol ] Nausea And Vomiting     REVIEW OF SYSTEMS:  [X]  denotes positive finding, [ ]  denotes negative finding Cardiac  Comments:  Chest pain or chest pressure:    Shortness of breath upon exertion:    Short of breath when lying flat:    Irregular heart rhythm:        Vascular    Pain in calf, thigh, or hip brought on by ambulation:    Pain in feet at night that wakes you up from your sleep:     Blood clot in your veins:    Leg swelling:         Pulmonary    Oxygen at home:     Productive cough:     Wheezing:         Neurologic    Sudden weakness in arms or legs:     Sudden numbness in arms or legs:     Sudden onset of difficulty speaking or slurred speech:    Temporary loss of vision in one eye:     Problems with dizziness:         Gastrointestinal    Blood in stool:     Vomited blood:         Genitourinary    Burning when urinating:     Blood in urine:        Psychiatric    Major depression:         Hematologic    Bleeding problems:    Problems with blood clotting too easily:        Skin    Rashes or ulcers:        Constitutional    Fever or chills:      PHYSICAL EXAMINATION:  Vitals:   04/23/24 0817  BP: (!) 112/51  Pulse: 91  Resp: 18  Temp: 98.1 F (36.7 C)  TempSrc: Oral  SpO2: 92%  Weight: 45.4 kg  Height: 5' 3 (1.6 m)    General:  WDWN in NAD; vital signs documented above Gait: Not observed HENT: WNL, normocephalic Pulmonary: normal non-labored breathing , without Rales, rhonchi,  wheezing Cardiac: regular HR,  Abdomen: soft, NT, no masses Skin: without rashes Vascular Exam/Pulses:  Right Left  Radial 2+ (normal) 1+ (weak)                       Extremities: without ischemic changes, without Gangrene , without cellulitis; with open wounds;  Fistula with light pulsatility.  Musculoskeletal: no muscle wasting or atrophy, amp L leg AKA Neurologic: A&O X 3;  No focal weakness or paresthesias are detected Psychiatric:  The pt has Normal affect.    ASSESSMENT/PLAN:: 68 y.o. male presenting with concern concern for steal syndrome  due to a wound in the webspace between the thumb and index finger.  Per [name], the wound is healing nicely.  This was confirmed by his aide.  Denies symptoms of numbness, tingling, pain.  Ultrasound was reviewed demonstrating significant improvement in flow with fistula compression, this is to be expected.  At this time, if the wound is healing, I do not think that he requires fistula  ligation for steal syndrome.  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  [name] and I had a long discussion regarding the above.  We discussed fistula revision, plication of the aneurysm with removal of the ulcerated dermis.  Should the wound on his hand worsen, or healing stagnate, we discussed ligation of the fistula with TDC placement.  He wants to keep his fistula as long as possible, therefore at this time, we will continue to manage the wound conservatively.    After discussing the risks and benefits of fistula revision, [name] elected to proceed. Regarding his right lower extremity heel wound.  The only surgery I would offer would be above-knee amputation due to his nonambulatory status.  He is not interested in this at this time.   Fonda FORBES Rim, MD [facility] [phone] Total time of patient care including pre-visit research, consultation, and documentation greater than 30 minutes

## 2024-04-24 ENCOUNTER — Encounter (HOSPITAL_COMMUNITY): Payer: Self-pay | Admitting: Vascular Surgery

## 2024-04-24 LAB — TYPE AND SCREEN
ABO/RH(D): O NEG
Antibody Screen: NEGATIVE
Unit division: 0

## 2024-04-24 LAB — BPAM RBC
Blood Product Expiration Date: 202510072359
ISSUE DATE / TIME: 202509231339
Unit Type and Rh: 9500

## 2024-04-25 NOTE — Anesthesia Postprocedure Evaluation (Signed)
 Anesthesia Post Note  Patient: [name]  Procedure(s) Performed: REVISON OF ARTERIOVENOUS FISTULA (Left: Arm Upper) PLICATION OF AV FISTULA (Left: Arm Upper)     Patient location during evaluation: PACU Anesthesia Type: General Level of consciousness: awake and alert Pain management: pain level controlled Vital Signs Assessment: post-procedure vital signs reviewed and stable Respiratory status: spontaneous breathing, nonlabored ventilation and respiratory function stable Cardiovascular status: blood pressure returned to baseline and stable Postop Assessment: no apparent nausea or vomiting Anesthetic complications: no   No notable events documented.                Dereke Neumann

## 2024-05-15 NOTE — Progress Notes (Unsigned)
 POST OPERATIVE OFFICE NOTE    CC:  F/u for surgery  HPI:  This is a 68 y.o. male who is s/p Left arm brachiocephalic fistula revision, aneurysmectomy  on 04/23/2024 by Dr. Lanis.  Dialysis access hx: - left BC AVF 12/11/2019 Dr. Oris -revision left [facility] AVF with plication 07/11/2022 Dr. Eliza -fistulogram with angioplasty cephalic vein arch at both ends of stent 08/16/2023 Dr. Melia  -Left arm brachiocephalic fistula revision, aneurysmectomy 04/23/2024 Dr. Lanis  Pt states he does not have pain/numbness in the left hand.   He has an area on the lateral aspect of the arm that has some mild oozing.  This area is not over is fistula.   The pt is on dialysis M/W/F [address].   Allergies  Allergen Reactions   Ultram  [Tramadol ] Nausea And Vomiting    Current Outpatient Medications  Medication Sig Dispense Refill   Amino Acids-Protein Hydrolys (PRO-STAT) LIQD Take 30 mLs by mouth 2 (two) times daily. (1000 & 1400)     amiodarone  (PACERONE ) 200 MG tablet Take 1 tablet (200 mg total) by mouth daily. 30 tablet 0   apixaban  (ELIQUIS ) 2.5 MG TABS tablet Take 1 tablet (2.5 mg total) by mouth 2 (two) times daily.     atorvastatin  (LIPITOR) 20 MG tablet Take 1 tablet (20 mg total) by mouth daily. (Patient taking differently: Take 20 mg by mouth at bedtime.)     bethanechol  (URECHOLINE ) 5 MG tablet Take 5 mg by mouth 3 (three) times daily. (0600, 1200 & 2000)     clotrimazole -betamethasone (LOTRISONE) cream Apply 1 Application topically daily. Apply topically to rash every day shift.     cyanocobalamin  1000 MCG tablet Take 1,000 mcg by mouth daily.     ferric citrate  (AURYXIA ) 1 GM 210 MG(Fe) tablet Take 210 mg by mouth with breakfast, with lunch, and with evening meal.     folic acid  (FOLVITE ) 1 MG tablet Take 1 mg by mouth in the morning.     hydrocortisone  cream 1 % Apply 1 Application topically daily as needed (rash). Applied to arm and elbow     liver oil-zinc oxide (DESITIN) 40 %  ointment Apply 1 Application topically in the morning and at bedtime. Apply topically to buttocks and sacrum area every day and night shift for incontinence/preventative care.     loperamide (IMODIUM) 2 MG capsule Take 2 mg by mouth as needed for diarrhea or loose stools.     metoprolol  tartrate (LOPRESSOR ) 25 MG tablet Take 12.5 mg by mouth every Tuesday, Thursday, and Saturday at 6 PM. Give 1/2 tablet (12.5mg ) by mouth three days a week on Tuesday, Thursday, Saturday.     mupirocin ointment (BACTROBAN) 2 % Apply 1 Application topically 2 (two) times daily.     oxyCODONE  (ROXICODONE ) 5 MG immediate release tablet Take 1 tablet (5 mg total) by mouth every 6 (six) hours as needed for severe pain (pain score 7-10). 15 tablet 0   OXYGEN Inhale 2 L/min into the lungs as needed (shortness of breath or maintain sats > 90%).     pantoprazole  (PROTONIX ) 40 MG tablet Take 1 tablet (40 mg total) by mouth 2 (two) times daily before a meal.     promethazine  (PHENERGAN ) 25 MG/ML injection Inject 25 mg into the muscle every 4 (four) hours as needed for nausea or vomiting.     tamsulosin  (FLOMAX ) 0.4 MG CAPS capsule Take 0.4 mg by mouth every Monday, Wednesday, and Friday. Take one tablet by mouth  3 times a week on Monday, Wednesday and Fridays     No current facility-administered medications for this visit.     ROS:  See HPI  Physical Exam:  Today's Vitals   05/16/24 1035 05/16/24 1100  BP: (!) 99/46 104/65  Pulse: 74 73  Temp: 98 F (36.7 C)   TempSrc: Temporal   PainSc: 0-No pain    There is no height or weight on file to calculate BMI.   Incision:  well healed with staples in tact.  Extremities:   Motor and sensory are in tact.   He has a small area on the lateral arm that he has scratched and has some mild oozing.  This area is not over the fistula and is lateral to his fistula and not attached to fistula.   There is an excellent thrill present.  Access is  easily  palpable    Assessment/Plan:  This is a 68 y.o. male who is s/p: Left arm brachiocephalic fistula revision, aneurysmectomy  on 04/23/2024 by Dr. Lanis.  -the pt does not have evidence of steal. -incision looks good and staples removed and steri strips placed.   -the area on the upper arm with mild bleeding is not over his fistula and not attached to his fistula.  Encouraged pt not to scratch this area so that it can heal.   -ok to apply lotion over areas that are itching except incision -would hold sticking over incision for another 4 weeks.  -the pt will follow up as needed   Lucie Apt, [facility] [phone]  Clinic MD:  Lanis

## 2024-05-16 ENCOUNTER — Ambulatory Visit: Attending: Vascular Surgery | Admitting: Physician Assistant

## 2024-05-16 VITALS — BP 104/65 | HR 73 | Temp 98.0°F

## 2024-05-16 DIAGNOSIS — N186 End stage renal disease: Secondary | ICD-10-CM

## 2024-06-13 ENCOUNTER — Emergency Department (HOSPITAL_COMMUNITY)

## 2024-06-13 ENCOUNTER — Other Ambulatory Visit: Payer: Self-pay

## 2024-06-13 ENCOUNTER — Inpatient Hospital Stay (HOSPITAL_COMMUNITY)
Admission: EM | Admit: 2024-06-13 | Discharge: 2024-06-21 | DRG: 377 | Disposition: A | Discharge status: Skilled Nursing Facility | Source: Skilled Nursing Facility | Attending: Family Medicine | Admitting: Family Medicine

## 2024-06-13 ENCOUNTER — Encounter (HOSPITAL_COMMUNITY): Payer: Self-pay

## 2024-06-13 DIAGNOSIS — Z833 Family history of diabetes mellitus: Secondary | ICD-10-CM

## 2024-06-13 DIAGNOSIS — R54 Age-related physical debility: Secondary | ICD-10-CM | POA: Diagnosis present

## 2024-06-13 DIAGNOSIS — E785 Hyperlipidemia, unspecified: Secondary | ICD-10-CM | POA: Diagnosis present

## 2024-06-13 DIAGNOSIS — I129 Hypertensive chronic kidney disease with stage 1 through stage 4 chronic kidney disease, or unspecified chronic kidney disease: Secondary | ICD-10-CM | POA: Diagnosis not present

## 2024-06-13 DIAGNOSIS — T182XXA Foreign body in stomach, initial encounter: Secondary | ICD-10-CM | POA: Diagnosis not present

## 2024-06-13 DIAGNOSIS — Z9981 Dependence on supplemental oxygen: Secondary | ICD-10-CM

## 2024-06-13 DIAGNOSIS — Z992 Dependence on renal dialysis: Secondary | ICD-10-CM | POA: Diagnosis not present

## 2024-06-13 DIAGNOSIS — M545 Low back pain, unspecified: Secondary | ICD-10-CM | POA: Diagnosis present

## 2024-06-13 DIAGNOSIS — Z8249 Family history of ischemic heart disease and other diseases of the circulatory system: Secondary | ICD-10-CM

## 2024-06-13 DIAGNOSIS — E11649 Type 2 diabetes mellitus with hypoglycemia without coma: Secondary | ICD-10-CM | POA: Diagnosis not present

## 2024-06-13 DIAGNOSIS — L89610 Pressure ulcer of right heel, unstageable: Secondary | ICD-10-CM | POA: Diagnosis present

## 2024-06-13 DIAGNOSIS — D509 Iron deficiency anemia, unspecified: Secondary | ICD-10-CM | POA: Diagnosis present

## 2024-06-13 DIAGNOSIS — B957 Other staphylococcus as the cause of diseases classified elsewhere: Secondary | ICD-10-CM | POA: Diagnosis present

## 2024-06-13 DIAGNOSIS — N186 End stage renal disease: Principal | ICD-10-CM | POA: Diagnosis present

## 2024-06-13 DIAGNOSIS — R34 Anuria and oliguria: Secondary | ICD-10-CM | POA: Diagnosis present

## 2024-06-13 DIAGNOSIS — I2510 Atherosclerotic heart disease of native coronary artery without angina pectoris: Secondary | ICD-10-CM | POA: Diagnosis present

## 2024-06-13 DIAGNOSIS — I132 Hypertensive heart and chronic kidney disease with heart failure and with stage 5 chronic kidney disease, or end stage renal disease: Secondary | ICD-10-CM | POA: Diagnosis present

## 2024-06-13 DIAGNOSIS — L89310 Pressure ulcer of right buttock, unstageable: Secondary | ICD-10-CM | POA: Diagnosis present

## 2024-06-13 DIAGNOSIS — N4829 Other inflammatory disorders of penis: Secondary | ICD-10-CM | POA: Diagnosis present

## 2024-06-13 DIAGNOSIS — L89620 Pressure ulcer of left heel, unstageable: Secondary | ICD-10-CM | POA: Diagnosis present

## 2024-06-13 DIAGNOSIS — D631 Anemia in chronic kidney disease: Secondary | ICD-10-CM | POA: Diagnosis present

## 2024-06-13 DIAGNOSIS — N4889 Other specified disorders of penis: Secondary | ICD-10-CM | POA: Diagnosis present

## 2024-06-13 DIAGNOSIS — K921 Melena: Secondary | ICD-10-CM | POA: Diagnosis present

## 2024-06-13 DIAGNOSIS — I5022 Chronic systolic (congestive) heart failure: Secondary | ICD-10-CM | POA: Diagnosis present

## 2024-06-13 DIAGNOSIS — E1122 Type 2 diabetes mellitus with diabetic chronic kidney disease: Secondary | ICD-10-CM | POA: Diagnosis present

## 2024-06-13 DIAGNOSIS — Z9049 Acquired absence of other specified parts of digestive tract: Secondary | ICD-10-CM

## 2024-06-13 DIAGNOSIS — E8889 Other specified metabolic disorders: Secondary | ICD-10-CM | POA: Diagnosis present

## 2024-06-13 DIAGNOSIS — K922 Gastrointestinal hemorrhage, unspecified: Secondary | ICD-10-CM | POA: Diagnosis present

## 2024-06-13 DIAGNOSIS — Z89421 Acquired absence of other right toe(s): Secondary | ICD-10-CM

## 2024-06-13 DIAGNOSIS — Z89512 Acquired absence of left leg below knee: Secondary | ICD-10-CM | POA: Diagnosis not present

## 2024-06-13 DIAGNOSIS — E46 Unspecified protein-calorie malnutrition: Secondary | ICD-10-CM | POA: Diagnosis present

## 2024-06-13 DIAGNOSIS — N400 Enlarged prostate without lower urinary tract symptoms: Secondary | ICD-10-CM | POA: Diagnosis not present

## 2024-06-13 DIAGNOSIS — D72829 Elevated white blood cell count, unspecified: Secondary | ICD-10-CM | POA: Diagnosis present

## 2024-06-13 DIAGNOSIS — Z7901 Long term (current) use of anticoagulants: Secondary | ICD-10-CM | POA: Diagnosis not present

## 2024-06-13 DIAGNOSIS — E1152 Type 2 diabetes mellitus with diabetic peripheral angiopathy with gangrene: Secondary | ICD-10-CM | POA: Diagnosis present

## 2024-06-13 DIAGNOSIS — Z79899 Other long term (current) drug therapy: Secondary | ICD-10-CM

## 2024-06-13 DIAGNOSIS — I480 Paroxysmal atrial fibrillation: Secondary | ICD-10-CM | POA: Diagnosis present

## 2024-06-13 DIAGNOSIS — L89150 Pressure ulcer of sacral region, unstageable: Secondary | ICD-10-CM | POA: Diagnosis present

## 2024-06-13 DIAGNOSIS — I959 Hypotension, unspecified: Secondary | ICD-10-CM | POA: Diagnosis present

## 2024-06-13 DIAGNOSIS — L8990 Pressure ulcer of unspecified site, unspecified stage: Secondary | ICD-10-CM | POA: Diagnosis present

## 2024-06-13 DIAGNOSIS — Z6821 Body mass index (BMI) 21.0-21.9, adult: Secondary | ICD-10-CM

## 2024-06-13 DIAGNOSIS — N4822 Cellulitis of corpus cavernosum and penis: Secondary | ICD-10-CM | POA: Diagnosis present

## 2024-06-13 DIAGNOSIS — Z5320 Procedure and treatment not carried out because of patient's decision for unspecified reasons: Secondary | ICD-10-CM | POA: Diagnosis present

## 2024-06-13 DIAGNOSIS — Z885 Allergy status to narcotic agent status: Secondary | ICD-10-CM

## 2024-06-13 DIAGNOSIS — D649 Anemia, unspecified: Secondary | ICD-10-CM | POA: Diagnosis not present

## 2024-06-13 LAB — COMPREHENSIVE METABOLIC PANEL WITH GFR
ALT: 11 U/L (ref 0–44)
AST: 17 U/L (ref 15–41)
Albumin: 2 g/dL — ABNORMAL LOW (ref 3.5–5.0)
Alkaline Phosphatase: 92 U/L (ref 38–126)
Anion gap: 16 — ABNORMAL HIGH (ref 5–15)
BUN: 13 mg/dL (ref 8–23)
CO2: 31 mmol/L (ref 22–32)
Calcium: 7.7 mg/dL — ABNORMAL LOW (ref 8.9–10.3)
Chloride: 94 mmol/L — ABNORMAL LOW (ref 98–111)
Creatinine, Ser: 2.6 mg/dL — ABNORMAL HIGH (ref 0.61–1.24)
GFR, Estimated: 27 mL/min — ABNORMAL LOW (ref >60)
Glucose, Bld: 130 mg/dL — ABNORMAL HIGH (ref 70–99)
Potassium: 2.8 mmol/L — ABNORMAL LOW (ref 3.5–5.1)
Sodium: 141 mmol/L (ref 135–145)
Total Bilirubin: 0.6 mg/dL (ref 0.0–1.2)
Total Protein: 6.1 g/dL — ABNORMAL LOW (ref 6.5–8.1)

## 2024-06-13 LAB — CBC
HCT: 27.4 % — ABNORMAL LOW (ref 39.0–52.0)
Hemoglobin: 8.3 g/dL — ABNORMAL LOW (ref 13.0–17.0)
MCH: 30.9 pg (ref 26.0–34.0)
MCHC: 30.3 g/dL (ref 30.0–36.0)
MCV: 101.9 fL — ABNORMAL HIGH (ref 80.0–100.0)
Platelets: 233 K/uL (ref 150–400)
RBC: 2.69 MIL/uL — ABNORMAL LOW (ref 4.22–5.81)
RDW: 18.4 % — ABNORMAL HIGH (ref 11.5–15.5)
WBC: 14.4 K/uL — ABNORMAL HIGH (ref 4.0–10.5)
nRBC: 0.1 % (ref 0.0–0.2)

## 2024-06-13 LAB — PREPARE RBC (CROSSMATCH)

## 2024-06-13 LAB — HEMOGLOBIN AND HEMATOCRIT, BLOOD
HCT: 27 % — ABNORMAL LOW (ref 39.0–52.0)
Hemoglobin: 8.2 g/dL — ABNORMAL LOW (ref 13.0–17.0)

## 2024-06-13 LAB — POC OCCULT BLOOD, ED: Fecal Occult Bld: POSITIVE — AB

## 2024-06-13 MED ORDER — HYDROMORPHONE HCL 1 MG/ML IJ SOLN: 0.5000 mg | INTRAMUSCULAR | Status: DC | PRN

## 2024-06-13 MED ORDER — PANTOPRAZOLE SODIUM 40 MG IV SOLR
40.0000 mg | Freq: Two times a day (BID) | INTRAVENOUS | Status: DC
  Administered 2024-06-13 – 2024-06-21 (×15): 40 mg via INTRAVENOUS
  Filled 2024-06-13 (×16): qty 10

## 2024-06-13 MED ORDER — ACETAMINOPHEN 500 MG PO TABS
500.0000 mg | ORAL_TABLET | Freq: Four times a day (QID) | ORAL | Status: DC | PRN
  Administered 2024-06-15: 500 mg via ORAL
  Filled 2024-06-13: qty 1

## 2024-06-13 MED ORDER — SODIUM CHLORIDE 0.9% IV SOLUTION: Freq: Once | INTRAVENOUS | Status: AC

## 2024-06-13 MED ORDER — POTASSIUM CHLORIDE CRYS ER 20 MEQ PO TBCR
40.0000 meq | EXTENDED_RELEASE_TABLET | Freq: Once | ORAL | Status: AC
  Administered 2024-06-13: 40 meq via ORAL
  Filled 2024-06-13: qty 2

## 2024-06-13 MED ORDER — MIDODRINE HCL 5 MG PO TABS
10.0000 mg | ORAL_TABLET | ORAL | Status: AC
  Administered 2024-06-13: 10 mg via ORAL
  Filled 2024-06-13: qty 2

## 2024-06-13 MED ORDER — POLYETHYLENE GLYCOL 3350 17 G PO PACK: 17.0000 g | PACK | Freq: Every day | ORAL | Status: DC | PRN

## 2024-06-13 MED ORDER — POTASSIUM CHLORIDE 20 MEQ PO PACK
40.0000 meq | PACK | Freq: Once | ORAL | Status: DC
  Filled 2024-06-13: qty 2

## 2024-06-13 MED ORDER — PROCHLORPERAZINE EDISYLATE 10 MG/2ML IJ SOLN
5.0000 mg | Freq: Four times a day (QID) | INTRAMUSCULAR | Status: DC | PRN
  Administered 2024-06-21: 5 mg via INTRAVENOUS
  Filled 2024-06-13: qty 2

## 2024-06-13 MED ORDER — OXYCODONE HCL 5 MG PO TABS
5.0000 mg | ORAL_TABLET | ORAL | Status: DC | PRN
  Administered 2024-06-14 – 2024-06-15 (×3): 10 mg via ORAL
  Administered 2024-06-16 – 2024-06-18 (×5): 5 mg via ORAL
  Filled 2024-06-13 (×2): qty 1
  Filled 2024-06-13: qty 2
  Filled 2024-06-13: qty 1
  Filled 2024-06-13: qty 2
  Filled 2024-06-13: qty 1
  Filled 2024-06-13: qty 2
  Filled 2024-06-13: qty 1

## 2024-06-13 MED ORDER — VANCOMYCIN HCL 1250 MG/250ML IV SOLN
1250.0000 mg | Freq: Once | INTRAVENOUS | Status: AC
  Administered 2024-06-14: 1250 mg via INTRAVENOUS
  Filled 2024-06-13: qty 250

## 2024-06-13 MED ORDER — NALOXONE HCL 0.4 MG/ML IJ SOLN: 0.4000 mg | INTRAMUSCULAR | Status: DC | PRN

## 2024-06-13 MED ORDER — MELATONIN 5 MG PO TABS
5.0000 mg | ORAL_TABLET | Freq: Every evening | ORAL | Status: DC | PRN
  Administered 2024-06-17 – 2024-06-19 (×3): 5 mg via ORAL
  Filled 2024-06-13 (×3): qty 1

## 2024-06-13 MED ORDER — OXYCODONE HCL 5 MG PO TABS
5.0000 mg | ORAL_TABLET | Freq: Once | ORAL | Status: AC
  Administered 2024-06-13: 5 mg via ORAL
  Filled 2024-06-13: qty 1

## 2024-06-13 MED ORDER — SODIUM CHLORIDE 0.9 % IV SOLN
1.0000 g | INTRAVENOUS | Status: DC
  Administered 2024-06-14 – 2024-06-17 (×5): 1 g via INTRAVENOUS
  Filled 2024-06-13 (×6): qty 10

## 2024-06-13 NOTE — ED Triage Notes (Signed)
 Pt bib [facility], facility told ems that he had bright red blood in his stool this morning. Pt states he had no pain during BM. Ems reports VS are stable.

## 2024-06-13 NOTE — ED Notes (Signed)
 Blood infusing, will obtain cultures after completion

## 2024-06-13 NOTE — H&P (Signed)
 History and Physical  [name] FMW:[id number] DOB: 12/09/1955 DOA: 06/13/2024  Referring physician: Dr. Zackowski, EDP  PCP: Patient, No Pcp Per  Outpatient Specialists: Nephrology, urology. Patient coming from: SNF.  Chief Complaint: Rectal bleeding  HPI: [name] is a 68 y.o. 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  Denies any abdominal pain or rectal pain.  Upon EMS arrival, his vital signs were stable.    In the ER, the patient was noted to have a large amount of melena in his diaper.  Hemoglobin 8.3 K from baseline of 9 K with positive FOBT.  Started on IV PPI twice daily.  EDP consulted GI and nephrology.  Admitted by [facility], hospitalist service.  At the time of visit, the patient denies chest pain, shortness of breath, palpitations, or dizziness.  ED Course: Temperature 98.1.  BP 106/40, pulse 74 respiratory rate 13, O2 saturation 97% on room air.  Review of Systems: Review of systems as noted in the HPI. All other systems reviewed and are negative.   Past Medical History:  Diagnosis Date   CHF (congestive heart failure) (HCC)    Diabetes mellitus without complication (HCC)    PATIENT JUST LEARNED HE WAS DIABETIC   ESRD on hemodialysis (HCC)    TTS at [facility]   Hepatitis    Hyperlipemia    Hypertension    Necrotizing fasciitis (HCC)    Pneumonia    HX OF PNA   Past Surgical History:  Procedure Laterality Date   A/V FISTULAGRAM N/A 08/16/2023   Procedure: A/V Fistulagram;  Surgeon: Melia Lynwood ORN, MD;  Location: [facility] INVASIVE CV LAB;  Service:  Cardiovascular;  Laterality: N/A;   AMPUTATION Left 09/11/2013   Procedure: AMPUTATION BELOW KNEE;  Surgeon: Kay Ozell Cummins, MD;  Location: [facility] OR;  Service: Orthopedics;  Laterality: Left;   AMPUTATION Right 12/18/2013   Procedure: RIGHT FOOT 1,2, TOE AMPUTATION  5th toe RAY AMPUTATION;  Surgeon: Kay Ozell Cummins, MD;  Location: [facility] OR;  Service: Orthopedics;  Laterality: Right;   ARTERY REPAIR Left 04/23/2024   Procedure: PLICATION OF AV FISTULA;  Surgeon: Lanis Fonda BRAVO, MD;  Location: [facility] OR;  Service: Vascular;  Laterality: Left;   AV FISTULA PLACEMENT Left 12/11/2019   Procedure: LEFT BRACHIOCEPHALIC FISTULA CREATION;  Surgeon: Oris Krystal FALCON, MD;  Location[facility] OR;  Service: Vascular;  Laterality: Left;   BIOPSY  07/19/2023   Procedure: BIOPSY;  Surgeon: Albertus Gordy HERO, MD;  Location: [facility] ENDOSCOPY;  Service: Gastroenterology;;   CHOLECYSTECTOMY     ESOPHAGOGASTRODUODENOSCOPY (EGD) WITH PROPOFOL  N/A 07/19/2023   Procedure: ESOPHAGOGASTRODUODENOSCOPY (EGD) WITH PROPOFOL ;  Surgeon: Albertus Gordy HERO, MD;  Location: [facility] ENDOSCOPY;  Service: Gastroenterology;  Laterality: N/A;   HOT HEMOSTASIS N/A 07/19/2023   Procedure: HOT HEMOSTASIS (ARGON PLASMA COAGULATION/BICAP);  Surgeon: Albertus Gordy HERO, MD;  Location: [facility] ENDOSCOPY;  Service: Gastroenterology;  Laterality: N/A;  stomach   I & D EXTREMITY Left 09/03/2013   Procedure: IRRIGATION AND DEBRIDEMENT EXTREMITY;  Surgeon: Kay Ozell Cummins, MD;  Location: [facility] OR;  Service: Orthopedics;  Laterality: Left;   I & D EXTREMITY  Left 09/11/2013   Procedure: LEFT FOOT IRRIGATION AND DEBRIDEMENT;  Surgeon: Kay Ozell Cummins, MD;  Location: [facility] OR;  Service: Orthopedics;  Laterality: Left;   INSERTION OF DIALYSIS CATHETER  12/11/2019   Procedure: Insertion Of Dialysis Catheter;  Surgeon: Oris Krystal FALCON, MD;  Location: [facility] OR;  Service: Vascular;;   INSERTION OF DIALYSIS CATHETER Right 07/11/2022   Procedure: INSERTION OF TUNNELED DIALYSIS CATHETER;  Surgeon: Eliza Lonni RAMAN, MD;  Location: [facility] OR;  Service: Vascular;  Laterality: Right;   LEFT HEART CATH AND CORONARY ANGIOGRAPHY N/A 12/13/2019   Procedure: LEFT HEART CATH AND CORONARY ANGIOGRAPHY;  Surgeon: Dann Candyce RAMAN, MD;  Location: [facility] INVASIVE CV LAB;  Service: Cardiovascular;  Laterality: N/A;   PERIPHERAL VASCULAR BALLOON ANGIOPLASTY Left 08/16/2023   Procedure: PERIPHERAL VASCULAR BALLOON ANGIOPLASTY;  Surgeon: Melia Lynwood ORN, MD;  Location: [facility] INVASIVE CV LAB;  Service: Cardiovascular;  Laterality: Left;   REVISON OF ARTERIOVENOUS FISTULA Left 07/11/2022   Procedure: REVISON OF LEFT ARM ARTERIOVENOUS FISTULA;  Surgeon: Eliza Lonni RAMAN, MD;  Location: [facility] OR;  Service: Vascular;  Laterality: Left;   REVISON OF ARTERIOVENOUS FISTULA Left 04/23/2024   Procedure: REVISON OF ARTERIOVENOUS FISTULA;  Surgeon: Lanis Fonda BRAVO, MD;  Location: [facility] OR;  Service: Vascular;  Laterality: Left;   RIGHT HEART CATH N/A 12/13/2019   Procedure: RIGHT HEART CATH;  Surgeon: Dann Candyce RAMAN, MD;  Location: [facility] INVASIVE CV LAB;  Service: Cardiovascular;  Laterality: N/A;    Social History:  reports that he has never smoked. He has never used smokeless tobacco. He reports that he does not drink alcohol and does not use drugs.   Allergies  Allergen Reactions   Ultram  [Tramadol ] Nausea And Vomiting    Family History  Problem Relation Age of Onset   Diabetes type II Mother    Dementia Mother    Heart disease Father       Prior to Admission medications   Medication Sig Start Date End Date Taking? Authorizing Provider  Amino Acids-Protein Hydrolys (PRO-STAT) LIQD Take 30 mLs by mouth 2 (two) times daily. (1000 & 1400)    [provider]  amiodarone  (PACERONE ) 200 MG tablet Take 1 tablet (200 mg total) by mouth daily. 10/15/22 07/31/24  McDiarmid, Krystal BIRCH, MD  apixaban  (ELIQUIS ) 2.5 MG TABS tablet Take 1 tablet (2.5 mg total) by mouth 2 (two) times daily. 04/25/24   Schuh, McKenzi P, PA-C   atorvastatin  (LIPITOR) 20 MG tablet Take 1 tablet (20 mg total) by mouth daily. Patient taking differently: Take 20 mg by mouth at bedtime. 12/17/19   Sherlean Failing, MD  bethanechol  (URECHOLINE ) 5 MG tablet Take 5 mg by mouth 3 (three) times daily. (0600, 1200 & 2000) 01/23/24   [provider]  clotrimazole -betamethasone (LOTRISONE) cream Apply 1 Application topically daily. Apply topically to rash every day shift.    [provider]  cyanocobalamin  1000 MCG tablet Take 1,000 mcg by mouth daily.    [provider]  ferric citrate  (AURYXIA ) 1 GM 210 MG(Fe) tablet Take 210 mg by mouth with breakfast, with lunch, and with evening meal. 07/29/23   [provider]  folic acid  (FOLVITE ) 1 MG tablet Take 1 mg by mouth in the morning.    [provider]  hydrocortisone  cream 1 % Apply 1 Application topically daily as needed (rash). Applied to arm and elbow    [provider]  liver oil-zinc oxide (DESITIN) 40 % ointment Apply 1 Application topically in the  morning and at bedtime. Apply topically to buttocks and sacrum area every day and night shift for incontinence/preventative care.    [provider]  loperamide (IMODIUM) 2 MG capsule Take 2 mg by mouth as needed for diarrhea or loose stools.    [provider]  metoprolol  tartrate (LOPRESSOR ) 25 MG tablet Take 12.5 mg by mouth every Tuesday, Thursday, and Saturday at 6 PM. Give 1/2 tablet (12.5mg ) by mouth three days a week on Tuesday, Thursday, Saturday. 01/23/24   [provider]  mupirocin ointment (BACTROBAN) 2 % Apply 1 Application topically 2 (two) times daily.    [provider]  oxyCODONE  (ROXICODONE ) 5 MG immediate release tablet Take 1 tablet (5 mg total) by mouth every 6 (six) hours as needed for severe pain (pain score 7-10). 04/23/24   Schuh, McKenzi P, PA-C  OXYGEN Inhale 2 L/min into the lungs as needed (shortness of breath or maintain sats > 90%).     [provider]  pantoprazole  (PROTONIX ) 40 MG tablet Take 1 tablet (40 mg total) by mouth 2 (two) times daily before a meal. 07/21/23   Caleen Qualia, MD  promethazine  (PHENERGAN ) 25 MG/ML injection Inject 25 mg into the muscle every 4 (four) hours as needed for nausea or vomiting.    [provider]  tamsulosin  (FLOMAX ) 0.4 MG CAPS capsule Take 0.4 mg by mouth every Monday, Wednesday, and Friday. Take one tablet by mouth 3 times a week on Monday, Wednesday and Fridays 12/20/22   [provider]    Physical Exam: BP (!) 106/40 (BP Location: Right Arm)   Pulse 74   Temp 98.1 F (36.7 C) (Oral)   Resp (!) 30   Ht 5' 2 (1.575 m)   Wt 54.4 kg   SpO2 96%   BMI 21.95 kg/m   General: 68 y.o. year-old male well developed well nourished in no acute distress.  Alert and oriented x3. Cardiovascular: Regular rate and rhythm with no rubs or gallops.  No thyromegaly or JVD noted.  No lower extremity edema. 2/4 pulses in all 4 extremities. Respiratory: Clear to auscultation with no wheezes or rales. Good inspiratory effort. Abdomen: Soft nontender nondistended with normal bowel sounds x4 quadrants. Muskuloskeletal: No cyanosis, clubbing or edema noted bilaterally.  Left below the knee amputation. Neuro: CN II-XII intact, strength, sensation, reflexes Skin: Right lower extremity, very dry scaly skin with right foot pressure ulcer, POA.  Gangrene of penile tip. Psychiatry: Judgement and insight appear normal. Mood is appropriate for condition and setting          Labs on Admission:  Basic Metabolic Panel: Recent Labs  Lab 06/13/24 1605  NA 141  K 2.8*  CL 94*  CO2 31  GLUCOSE 130*  BUN 13  CREATININE 2.60*  CALCIUM  7.7*   Liver Function Tests: Recent Labs  Lab 06/13/24 1605  AST 17  ALT 11  ALKPHOS 92  BILITOT 0.6  PROT 6.1*  ALBUMIN  2.0*   No results for input(s): LIPASE, AMYLASE in the last 168 hours. No results for input(s): AMMONIA in the  last 168 hours. CBC: Recent Labs  Lab 06/13/24 1605  WBC 14.4*  HGB 8.3*  HCT 27.4*  MCV 101.9*  PLT 233   Cardiac Enzymes: No results for input(s): CKTOTAL, CKMB, CKMBINDEX, TROPONINI in the last 168 hours.  BNP (last 3 results) Recent Labs    08/24/23 2149  BNP 809.2*    ProBNP (last 3 results) No results for input(s): PROBNP in the  last 8760 hours.  CBG: No results for input(s): GLUCAP in the last 168 hours.  Radiological Exams on Admission: DG Chest Port 1 View Result Date: 06/13/2024 CLINICAL DATA:  GI bleed EXAM: PORTABLE CHEST 1 VIEW COMPARISON:  Chest x-ray 03/22/2024 FINDINGS: There is minimal left basilar atelectasis or airspace disease. There is no pleural effusion or pneumothorax. The heart is mildly enlarged. Vascular stent is noted in the left axilla. No acute fractures are identified. IMPRESSION: 1. Minimal left basilar atelectasis or airspace disease. 2. Mild cardiomegaly. Electronically Signed   By: Greig Pique M.D.   On: 06/13/2024 21:09    EKG: I independently viewed the EKG done and my findings are as followed: Sinus rhythm rate of 72.  QTc 483.  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 PPI twice daily. GI consulted by EDP. Continue to hold off antiplatelets and anticoagulants Continue IV PPI twice daily until seen by GI. Avoid NSAIDs. Serial H&H every 6 hours. 1 unit PRBCs ordered to be transfused for hemoglobin of 8.3 K in the setting of acute GI bleed. Maintain hemoglobin above 8.0 in the setting of  chronic HFrEF.  Chronic HFrEF Euvolemic on exam Last 2D echo done on 08/24/2022 revealed LVEF 40 to 45%. Volume status managed with hemodialysis. 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 Hold off DOAC until cleared by GI. Hold off home amiodarone  due to hypotension  Hypotension BP 118/30 with MAP of 52 1 dose of midodrine  10 mg x 1 ordered to be administered Closely monitor vital signs  Hyperlipidemia Resume home statin  Coronary artery disease Denies any anginal symptoms Hold off antiplatelets due to GI bleed   Critical care time: 55 minutes.   DVT prophylaxis: SCDs.  Code Status: Full code.  Family Communication: None at bedside.  Disposition Plan: Admitted to progressive care unit.  Consults called: GI, nephrology.  Admission status: Inpatient status.   Status is: Inpatient The patient requires at least 2 midnights for further evaluation and treatment of present condition.   Terry LOISE Hurst MD [facility] Pager [phone]  If 7PM-7AM, please contact night-coverage [email or web address] Password [facility]  06/13/2024, 9:16 PM

## 2024-06-13 NOTE — ED Provider Notes (Addendum)
 [place] EMERGENCY DEPARTMENT AT [facility] Provider Note   CSN: [id number] Arrival date & time: 06/13/24  1543     Patient presents with: Rectal Bleeding   [name] is a 68 y.o. male.   Patient from [facility].  Sent in for red blood in his stool this morning.  But here he has a diaper full of melena.  Very dark tarry.  Hemoccult pending.  No abdominal pain.  Patient is a dialysis patient.  Normally dialyzed Monday Wednesdays and Fridays he was last dialyzed on Wednesday.  Patient without any complaints.  No abdominal pain no nausea no vomiting.  Patient not on blood thinners.       Prior to Admission medications   Medication Sig Start Date End Date Taking? Authorizing Provider  Amino Acids-Protein Hydrolys (PRO-STAT) LIQD Take 30 mLs by mouth 2 (two) times daily. (1000 & 1400)    [provider]  amiodarone  (PACERONE ) 200 MG tablet Take 1 tablet (200 mg total) by mouth daily. 10/15/22 07/31/24  McDiarmid, Krystal BIRCH, MD  apixaban  (ELIQUIS ) 2.5 MG TABS tablet Take 1 tablet (2.5 mg total) by mouth 2 (two) times daily. 04/25/24   Schuh, McKenzi P, PA-C  atorvastatin  (LIPITOR) 20 MG tablet Take 1 tablet (20 mg total) by mouth daily. Patient taking differently: Take 20 mg by mouth at bedtime. 12/17/19   Sherlean Failing, MD  bethanechol  (URECHOLINE ) 5 MG tablet Take 5 mg by mouth 3 (three) times daily. (0600, 1200 & 2000) 01/23/24   [provider]  clotrimazole -betamethasone (LOTRISONE) cream Apply 1 Application topically daily. Apply topically to rash every day shift.    [provider]  cyanocobalamin  1000 MCG tablet Take 1,000 mcg by mouth daily.    [provider]  ferric citrate  (AURYXIA ) 1 GM 210 MG(Fe) tablet Take 210 mg by mouth with breakfast, with lunch, and with evening meal. 07/29/23   [provider]  folic acid  (FOLVITE ) 1 MG tablet Take 1 mg by mouth in the morning.    [provider]  hydrocortisone  cream 1  % Apply 1 Application topically daily as needed (rash). Applied to arm and elbow    [provider]  liver oil-zinc oxide (DESITIN) 40 % ointment Apply 1 Application topically in the morning and at bedtime. Apply topically to buttocks and sacrum area every day and night shift for incontinence/preventative care.    [provider]  loperamide (IMODIUM) 2 MG capsule Take 2 mg by mouth as needed for diarrhea or loose stools.    [provider]  metoprolol  tartrate (LOPRESSOR ) 25 MG tablet Take 12.5 mg by mouth every Tuesday, Thursday, and Saturday at 6 PM. Give 1/2 tablet (12.5mg ) by mouth three days a week on Tuesday, Thursday, Saturday. 01/23/24   [provider]  mupirocin ointment (BACTROBAN) 2 % Apply 1 Application topically 2 (two) times daily.    [provider]  oxyCODONE  (ROXICODONE ) 5 MG immediate release tablet Take 1 tablet (5 mg total) by mouth every 6 (six) hours as needed for severe pain (pain score 7-10). 04/23/24   Schuh, McKenzi P, PA-C  OXYGEN Inhale 2 L/min into the lungs as needed (shortness of breath or maintain sats > 90%).    [provider]  pantoprazole  (PROTONIX ) 40 MG tablet Take 1 tablet (40 mg total) by mouth 2 (two) times daily before a meal. 07/21/23   Caleen Qualia, MD  promethazine  (PHENERGAN ) 25 MG/ML injection Inject 25 mg into the muscle every 4 (  four) hours as needed for nausea or vomiting.    [provider]  tamsulosin  (FLOMAX ) 0.4 MG CAPS capsule Take 0.4 mg by mouth every Monday, Wednesday, and Friday. Take one tablet by mouth 3 times a week on Monday, Wednesday and Fridays 12/20/22   [provider]    Allergies: Ultram  [tramadol ]    Review of Systems  Constitutional:  Negative for chills and fever.  HENT:  Negative for ear pain and sore throat.   Eyes:  Negative for pain and visual disturbance.  Respiratory:  Negative for cough and shortness of breath.   Cardiovascular:  Negative for chest  pain and palpitations.  Gastrointestinal:  Positive for blood in stool. Negative for abdominal pain and vomiting.  Genitourinary:  Negative for dysuria and hematuria.  Musculoskeletal:  Negative for arthralgias and back pain.  Skin:  Negative for color change and rash.  Neurological:  Negative for seizures and syncope.  All other systems reviewed and are negative.   Updated Vital Signs BP (!) 110/38 (BP Location: Right Arm)   Pulse 78   Temp 98.2 F (36.8 C) (Oral)   Resp 17   Ht 1.575 m (5' 2)   Wt 54.4 kg   SpO2 99%   BMI 21.95 kg/m   Physical Exam Vitals and nursing note reviewed.  Constitutional:      General: He is not in acute distress.    Appearance: Normal appearance. He is well-developed. He is not ill-appearing.  HENT:     Head: Normocephalic and atraumatic.  Eyes:     Conjunctiva/sclera: Conjunctivae normal.     Pupils: Pupils are equal, round, and reactive to light.  Cardiovascular:     Rate and Rhythm: Normal rate and regular rhythm.     Heart sounds: No murmur heard. Pulmonary:     Effort: Pulmonary effort is normal. No respiratory distress.     Breath sounds: Normal breath sounds.  Abdominal:     Palpations: Abdomen is soft.     Tenderness: There is no abdominal tenderness.  Genitourinary:    Rectum: Guaiac result positive.     Comments: Stool very black in color.  Tip of the penis with a little bit of some necrosis but no secondary infection. Musculoskeletal:        General: No swelling.     Cervical back: Normal range of motion and neck supple.  Skin:    General: Skin is warm and dry.     Capillary Refill: Capillary refill takes less than 2 seconds.  Neurological:     General: No focal deficit present.     Mental Status: He is alert and oriented to person, place, and time.  Psychiatric:        Mood and Affect: Mood normal.     (all labs ordered are listed, but only abnormal results are displayed) Labs Reviewed  COMPREHENSIVE METABOLIC PANEL  WITH GFR - Abnormal; Notable for the following components:      Result Value   Potassium 2.8 (*)    Chloride 94 (*)    Glucose, Bld 130 (*)    Creatinine, Ser 2.60 (*)    Calcium  7.7 (*)    Total Protein 6.1 (*)    Albumin  2.0 (*)    GFR, Estimated 27 (*)    Anion gap 16 (*)    All other components within normal limits  CBC - Abnormal; Notable for the following components:   WBC 14.4 (*)    RBC 2.69 (*)  Hemoglobin 8.3 (*)    HCT 27.4 (*)    MCV 101.9 (*)    RDW 18.4 (*)    All other components within normal limits  POC OCCULT BLOOD, ED  POC OCCULT BLOOD, ED  TYPE AND SCREEN    EKG: EKG Interpretation Date/Time:  Thursday June 13 2024 16:01:21 EST Ventricular Rate:  73 PR Interval:  203 QRS Duration:  113 QT Interval:  437 QTC Calculation: 482 R Axis:   142  Text Interpretation: Sinus rhythm IRBBB and LPFB Consider left ventricular hypertrophy Nonspecific T abnormalities, lateral leads Borderline prolonged QT interval Confirmed by Syeda Prickett [phone]) on 06/13/2024 5:07:29 PM  Radiology: No results found.   Procedures   Medications Ordered in the ED  oxyCODONE  (Oxy IR/ROXICODONE ) immediate release tablet 5 mg (has no administration in time range)                                    Medical Decision Making Amount and/or Complexity of Data Reviewed Labs: ordered. Radiology: ordered.  Risk Prescription drug management. Decision regarding hospitalization.   Patient is a dialysis patient.  Potassium 2.8 creatinine 2.6 GFR 27 otherwise electrolytes normal.  CBC white count 14.4 hemoglobin 8.3 platelets 233.  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  Suspect heme positive melena.  Will confirm.  Labs as above.  Will type and screen.  Will give patient his oxycodone  5 mg.  He will need chest x-ray as well.  Hemoccult is positive.  Patient hemodynamically stable.  Will  consult nephrology and will consult hospitalist for admission   Final diagnoses:  End stage renal disease on dialysis [facility])  Melena    ED Discharge Orders     None          Geraldene Hamilton, MD 06/13/24 ULYESS    Geraldene Hamilton, MD 06/13/24 GREGOR    Geraldene Hamilton, MD 06/13/24 2025

## 2024-06-13 NOTE — ED Notes (Signed)
Admit Provider at bedside. 

## 2024-06-13 NOTE — ED Notes (Signed)
 Pt in bed, pt has no requests and reports no complaints.

## 2024-06-13 NOTE — Progress Notes (Signed)
 Pharmacy Antibiotic Note  [name] is a 68 y.o. male for which pharmacy has been consulted for cefepime  and vancomycin  dosing for cellulitis.  Patient with a history of ESRD on HD MWF (last session 11/12), HTN, HLD, HF, CAD, AF, recent gangrene of penis. Patient presenting with BRB in stool.  WBC 14.4; T 98.1; HR 71; RR 17  Plan: Vancomycin  1250 mg now - repeat pending HD plan Cefepime  1g q24hr -- will give in the evenings to fall after HD on HD days Monitor WBC, fever, renal function, cultures De-escalate when able F/u Nephrology plan  Height: 5' 2 (157.5 cm) Weight: 54.4 kg (120 lb) IBW/kg (Calculated) : 54.6  Temp (24hrs), Avg:98.2 F (36.8 C), Min:98.1 F (36.7 C), Max:98.3 F (36.8 C)  Recent Labs  Lab 06/13/24 1605  WBC 14.4*  CREATININE 2.60*    Estimated Creatinine Clearance: 21.8 mL/min (A) (by C-G formula based on SCr of 2.6 mg/dL (H)).    Allergies  Allergen Reactions   Ultram  [Tramadol ] Nausea And Vomiting    Microbiology results: Pending  Thank you for allowing pharmacy to be a part of this patient's care.  Dorn Buttner, PharmD, BCPS 06/13/2024 10:53 PM ED Clinical Pharmacist -  [phone]

## 2024-06-14 DIAGNOSIS — K922 Gastrointestinal hemorrhage, unspecified: Secondary | ICD-10-CM | POA: Diagnosis not present

## 2024-06-14 DIAGNOSIS — N186 End stage renal disease: Secondary | ICD-10-CM

## 2024-06-14 DIAGNOSIS — Z7901 Long term (current) use of anticoagulants: Secondary | ICD-10-CM | POA: Diagnosis not present

## 2024-06-14 DIAGNOSIS — I480 Paroxysmal atrial fibrillation: Secondary | ICD-10-CM | POA: Diagnosis not present

## 2024-06-14 DIAGNOSIS — E785 Hyperlipidemia, unspecified: Secondary | ICD-10-CM

## 2024-06-14 DIAGNOSIS — D649 Anemia, unspecified: Secondary | ICD-10-CM | POA: Diagnosis not present

## 2024-06-14 DIAGNOSIS — N4829 Other inflammatory disorders of penis: Secondary | ICD-10-CM

## 2024-06-14 DIAGNOSIS — L8990 Pressure ulcer of unspecified site, unspecified stage: Secondary | ICD-10-CM

## 2024-06-14 DIAGNOSIS — I5022 Chronic systolic (congestive) heart failure: Secondary | ICD-10-CM | POA: Diagnosis not present

## 2024-06-14 DIAGNOSIS — I959 Hypotension, unspecified: Secondary | ICD-10-CM

## 2024-06-14 DIAGNOSIS — K921 Melena: Secondary | ICD-10-CM

## 2024-06-14 DIAGNOSIS — N400 Enlarged prostate without lower urinary tract symptoms: Secondary | ICD-10-CM

## 2024-06-14 DIAGNOSIS — Z992 Dependence on renal dialysis: Secondary | ICD-10-CM

## 2024-06-14 LAB — RENAL FUNCTION PANEL
Albumin: 1.9 g/dL — ABNORMAL LOW (ref 3.5–5.0)
Anion gap: 19 — ABNORMAL HIGH (ref 5–15)
BUN: 17 mg/dL (ref 8–23)
CO2: 28 mmol/L (ref 22–32)
Calcium: 8 mg/dL — ABNORMAL LOW (ref 8.9–10.3)
Chloride: 97 mmol/L — ABNORMAL LOW (ref 98–111)
Creatinine, Ser: 2.86 mg/dL — ABNORMAL HIGH (ref 0.61–1.24)
GFR, Estimated: 24 mL/min — ABNORMAL LOW (ref >60)
Glucose, Bld: 101 mg/dL — ABNORMAL HIGH (ref 70–99)
Phosphorus: 3.5 mg/dL (ref 2.5–4.6)
Potassium: 3 mmol/L — ABNORMAL LOW (ref 3.5–5.1)
Sodium: 144 mmol/L (ref 135–145)

## 2024-06-14 LAB — CBC
HCT: 31.4 % — ABNORMAL LOW (ref 39.0–52.0)
HCT: 33.4 % — ABNORMAL LOW (ref 39.0–52.0)
Hemoglobin: 9.7 g/dL — ABNORMAL LOW (ref 13.0–17.0)
Hemoglobin: 10.5 g/dL — ABNORMAL LOW (ref 13.0–17.0)
MCH: 30.3 pg (ref 26.0–34.0)
MCH: 30.4 pg (ref 26.0–34.0)
MCHC: 30.9 g/dL (ref 30.0–36.0)
MCHC: 31.4 g/dL (ref 30.0–36.0)
MCV: 98.1 fL (ref 80.0–100.0)
MCV: 96.8 fL (ref 80.0–100.0)
Platelets: 231 K/uL (ref 150–400)
Platelets: 259 K/uL (ref 150–400)
RBC: 3.2 MIL/uL — ABNORMAL LOW (ref 4.22–5.81)
RBC: 3.45 MIL/uL — ABNORMAL LOW (ref 4.22–5.81)
RDW: 21.1 % — ABNORMAL HIGH (ref 11.5–15.5)
RDW: 21 % — ABNORMAL HIGH (ref 11.5–15.5)
WBC: 13.1 K/uL — ABNORMAL HIGH (ref 4.0–10.5)
WBC: 13.7 K/uL — ABNORMAL HIGH (ref 4.0–10.5)
nRBC: 0 % (ref 0.0–0.2)
nRBC: 0 % (ref 0.0–0.2)

## 2024-06-14 LAB — CBC WITH DIFFERENTIAL/PLATELET
Abs Immature Granulocytes: 0.11 K/uL — ABNORMAL HIGH (ref 0.00–0.07)
Basophils Absolute: 0 K/uL (ref 0.0–0.1)
Basophils Relative: 0 %
Eosinophils Absolute: 0 K/uL (ref 0.0–0.5)
Eosinophils Relative: 0 %
HCT: 30.9 % — ABNORMAL LOW (ref 39.0–52.0)
Hemoglobin: 9.6 g/dL — ABNORMAL LOW (ref 13.0–17.0)
Immature Granulocytes: 1 %
Lymphocytes Relative: 6 %
Lymphs Abs: 0.8 K/uL (ref 0.7–4.0)
MCH: 30.6 pg (ref 26.0–34.0)
MCHC: 31.1 g/dL (ref 30.0–36.0)
MCV: 98.4 fL (ref 80.0–100.0)
Monocytes Absolute: 0.6 K/uL (ref 0.1–1.0)
Monocytes Relative: 4 %
Neutro Abs: 12.1 K/uL — ABNORMAL HIGH (ref 1.7–7.7)
Neutrophils Relative %: 89 %
Platelets: 242 K/uL (ref 150–400)
RBC: 3.14 MIL/uL — ABNORMAL LOW (ref 4.22–5.81)
RDW: 20.6 % — ABNORMAL HIGH (ref 11.5–15.5)
WBC: 13.7 K/uL — ABNORMAL HIGH (ref 4.0–10.5)
nRBC: 0 % (ref 0.0–0.2)

## 2024-06-14 LAB — RPR: RPR Ser Ql: NONREACTIVE

## 2024-06-14 LAB — MRSA NEXT GEN BY PCR, NASAL: MRSA by PCR Next Gen: NOT DETECTED

## 2024-06-14 LAB — HEPATITIS B SURFACE ANTIGEN: Hepatitis B Surface Ag: NONREACTIVE

## 2024-06-14 MED ORDER — LIDOCAINE HCL (PF) 1 % IJ SOLN: 5.0000 mL | INTRAMUSCULAR | Status: DC | PRN

## 2024-06-14 MED ORDER — HEPARIN SODIUM (PORCINE) 1000 UNIT/ML DIALYSIS: 1000.0000 [IU] | INTRAMUSCULAR | Status: DC | PRN

## 2024-06-14 MED ORDER — ANTICOAGULANT SODIUM CITRATE 4% (200MG/5ML) IV SOLN: 5.0000 mL | Status: DC | PRN

## 2024-06-14 MED ORDER — LIDOCAINE-PRILOCAINE 2.5-2.5 % EX CREA: 1.0000 | TOPICAL_CREAM | CUTANEOUS | Status: DC | PRN

## 2024-06-14 MED ORDER — SODIUM CHLORIDE 0.9 % IV BOLUS
500.0000 mL | Freq: Once | INTRAVENOUS | Status: AC
  Administered 2024-06-14: 500 mL via INTRAVENOUS

## 2024-06-14 MED ORDER — ATORVASTATIN CALCIUM 10 MG PO TABS
20.0000 mg | ORAL_TABLET | Freq: Every day | ORAL | Status: DC
  Administered 2024-06-14 – 2024-06-20 (×7): 20 mg via ORAL
  Filled 2024-06-14 (×6): qty 2

## 2024-06-14 MED ORDER — LIDOCAINE-PRILOCAINE 2.5-2.5 % EX CREA
1.0000 | TOPICAL_CREAM | CUTANEOUS | Status: DC | PRN
Start: 2024-06-14 — End: 2024-06-14

## 2024-06-14 MED ORDER — AMIODARONE HCL 200 MG PO TABS
200.0000 mg | ORAL_TABLET | Freq: Every day | ORAL | Status: DC
  Administered 2024-06-14 – 2024-06-21 (×7): 200 mg via ORAL
  Filled 2024-06-14 (×7): qty 1

## 2024-06-14 MED ORDER — TAMSULOSIN HCL 0.4 MG PO CAPS
0.4000 mg | ORAL_CAPSULE | ORAL | Status: DC
  Administered 2024-06-14 – 2024-06-21 (×4): 0.4 mg via ORAL
  Filled 2024-06-14 (×4): qty 1

## 2024-06-14 MED ORDER — SODIUM CHLORIDE 0.9 % IV BOLUS
250.0000 mL | Freq: Once | INTRAVENOUS | Status: AC
Start: 2024-06-15 — End: 2024-06-14
  Administered 2024-06-14: 250 mL via INTRAVENOUS

## 2024-06-14 MED ORDER — MIDODRINE HCL 5 MG PO TABS
10.0000 mg | ORAL_TABLET | ORAL | Status: AC
  Administered 2024-06-14: 10 mg via ORAL
  Filled 2024-06-14: qty 2

## 2024-06-14 MED ORDER — NEPRO/CARBSTEADY PO LIQD: 237.0000 mL | ORAL | Status: DC | PRN

## 2024-06-14 MED ORDER — PENTAFLUOROPROP-TETRAFLUOROETH EX AERO: 1.0000 | INHALATION_SPRAY | CUTANEOUS | Status: DC | PRN

## 2024-06-14 MED ORDER — HYDROCERIN EX CREA
TOPICAL_CREAM | Freq: Two times a day (BID) | CUTANEOUS | Status: DC
  Filled 2024-06-14 (×2): qty 113

## 2024-06-14 MED ORDER — ALBUMIN HUMAN 25 % IV SOLN
25.0000 g | Freq: Once | INTRAVENOUS | Status: AC
  Administered 2024-06-14: 12.5 g via INTRAVENOUS
  Filled 2024-06-14: qty 100

## 2024-06-14 MED ORDER — ALTEPLASE 2 MG IJ SOLR: 2.0000 mg | Freq: Once | INTRAMUSCULAR | Status: DC | PRN

## 2024-06-14 MED ORDER — VANCOMYCIN HCL 500 MG/100ML IV SOLN
500.0000 mg | Freq: Once | INTRAVENOUS | Status: AC
  Administered 2024-06-14: 500 mg via INTRAVENOUS
  Filled 2024-06-14: qty 100

## 2024-06-14 MED ORDER — CHLORHEXIDINE GLUCONATE CLOTH 2 % EX PADS
6.0000 | MEDICATED_PAD | Freq: Every day | CUTANEOUS | Status: DC
  Administered 2024-06-14: 6 via TOPICAL

## 2024-06-14 MED ORDER — MIDODRINE HCL 5 MG PO TABS
10.0000 mg | ORAL_TABLET | Freq: Once | ORAL | Status: AC
  Administered 2024-06-14: 10 mg via ORAL
  Filled 2024-06-14: qty 2

## 2024-06-14 MED ORDER — SODIUM CHLORIDE 0.9 % IV BOLUS
250.0000 mL | Freq: Once | INTRAVENOUS | Status: AC
  Administered 2024-06-14: 250 mL via INTRAVENOUS

## 2024-06-14 MED ORDER — ZINC OXIDE 40 % EX OINT
TOPICAL_OINTMENT | Freq: Two times a day (BID) | CUTANEOUS | Status: DC
  Filled 2024-06-14 (×2): qty 57

## 2024-06-14 NOTE — TOC Initial Note (Signed)
 Transition of Care [facility]) - Initial/Assessment Note    Patient Details  Name: [name] MRN: [id number] Date of Birth: September 19, 1955  Transition of Care [facility]) CM/SW Contact:    Isaiah Public, LCSWA Phone Number: 06/14/2024, 11:47 AM  Clinical Narrative:                  PTA patient comes from [facility] where he is a long term resident.  Patient receives out-pt HD at [facility], MWF, 1025am chair time. CSW will continue to follow and assist with patients dc planning needs.       Patient Goals and CMS Choice            Expected Discharge Plan and Services                                              Prior Living Arrangements/Services                       Activities of Daily Living      Permission Sought/Granted                  Emotional Assessment              Admission diagnosis:  Melena [K92.1] GI bleed [K92.2] End stage renal disease on dialysis (HCC) [N18.6, Z99.2] Patient Active Problem List   Diagnosis Date Noted   Gangrene of penis 06/14/2024   Hyperlipemia    GI bleed 06/13/2024   Pressure injury of ankle, stage 2 (HCC) 08/28/2023   Septic shock (HCC) 08/24/2023   Encephalopathy acute 08/24/2023   Lactic acidosis 08/24/2023   Melena 07/21/2023   Heme positive stool 07/19/2023   Acute gastritis with hemorrhage 07/19/2023   Gastric and duodenal angiodysplasia with hemorrhage 07/19/2023   ABLA (acute blood loss anemia) 07/17/2023   GIB (gastrointestinal bleeding) 07/17/2023   NSTEMI (non-ST elevated myocardial infarction) (HCC) 10/05/2022   Ileus (HCC) 09/30/2022   Urinary retention 09/30/2022   Paralytic ileus (HCC) 09/30/2022   ESRD on hemodialysis (HCC) 09/30/2022   Chronic HFrEF (heart failure with reduced ejection fraction) (HCC) 09/30/2022   Hypotension 09/29/2022   Paroxysmal atrial fibrillation (HCC) 09/29/2022   DVT (deep venous thrombosis) (HCC) 09/24/2022   Hospital-acquired bacterial pneumonia  09/24/2022   Acute hypoxic respiratory failure (HCC) 09/23/2022   Secondary hyperparathyroidism of renal origin 02/09/2021   Fluid overload, unspecified 11/06/2020   COVID-19 08/10/2020   Iron  deficiency anemia, unspecified 01/10/2020   Abnormality of albumin  12/20/2019   Encounter for immunization 12/17/2019   Anaphylactic shock, unspecified, initial encounter 12/16/2019   Cardiomyopathy, unspecified (HCC) 12/16/2019   Heart failure, unspecified (HCC) 12/16/2019   Hyperparathyroidism, unspecified 12/16/2019   Metabolic disorder, unspecified 12/16/2019   Allergy, unspecified, initial encounter 12/16/2019   Other specified coagulation defects 12/16/2019   Pain, unspecified 12/16/2019   Acute systolic CHF (congestive heart failure) (HCC)    S/P dialysis catheter insertion    ESRD on dialysis (HCC) 12/12/2019   PAD (peripheral artery disease) 12/12/2019   Pressure injury 12/01/2019   Anemia 11/29/2019   Uncontrolled hypertension 11/29/2019   CKD (chronic kidney disease), stage V (HCC) 11/29/2019   Severe protein-calorie malnutrition    Diabetic polyneuropathy associated with type 2 diabetes mellitus (HCC)    Venous insufficiency of right lower extremity    Pneumonia    Renal  failure 11/28/2019   Toe gangrene (HCC) 12/18/2013   Gangrene of toe (HCC) 09/16/2013   Fever with chills 09/16/2013   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/2013   DKA (diabetic ketoacidosis) (HCC) 09/04/2013   Type 2 diabetes mellitus, controlled (HCC) 09/02/2013   DM (diabetes mellitus) type II controlled, neurological manifestation (HCC) 09/01/2013   PCP:  Patient, No Pcp Per Pharmacy:   [facility] [address] [place] [address] Phone: [phone] Fax: [phone]  [facility] - [place], [place] - [address] [place] [address] Phone: [phone] Fax: [phone]     Social Drivers of Health (SDOH) Social  History: SDOH Screenings   Food Insecurity: Patient Declined (03/23/2024)  Housing: Patient Declined (03/23/2024)  Transportation Needs: Patient Unable To Answer (03/25/2024)  Utilities: Patient Unable To Answer (03/25/2024)  Social Connections: Patient Unable To Answer (03/25/2024)  Tobacco Use: Low Risk  (06/13/2024)   SDOH Interventions:     Readmission Risk Interventions     No data to display

## 2024-06-14 NOTE — Consult Note (Signed)
 This remote consultation was conducted using EMR wound digital images downloaded by the staff member. Pertinent information was reviewed in order to determine recommendations. Coordinated care with primary nurse via secure chat.   WOC Nurse Consult Note: Reason for Consult: Buttock, Right and Left Sacrum Wound type: Unstageable PI Pressure Injury POA: Yes Wound bed: non-viable tissue Drainage appears moist Periwound: scar tissue, denuded sites Dressing procedure/placement/frequency:  Desitin ointment BID with foam dressing to pad and protect Buttock 06/14/24    WOC Nurse wound follow up Wound type: Right heel PI, Left heel PI Unstageable Wound bed: dark scaly tissue Drainage appears dehydrated Periwound: xerosis recommend emollient Dressing procedure/placement/frequency:  Cleanse heel wound with normal saline, pat dry, apply Mepilex foam dressing, assess every shift by lifting foam, chang Mepilex every 2-3 days, or PRN, date dressing. Off-load heels at all times with protective boots [id number].   11/24  11/14   WOC will not follow and will remove patient from census task list. Please reconsult if wound worsens in condition and notify provider.   Sherrilyn Hals MSN RN CWOCN WOC [facility]  [phone] (Available from 7-3 pm Mon-Friday)

## 2024-06-14 NOTE — Assessment & Plan Note (Signed)
 Patient was found to have multiple unstageable pressure injuries, POA. Involving buttock, ischium and heel. -Wound care was consulted

## 2024-06-14 NOTE — Hospital Course (Addendum)
 Partly taken from H&P.  [name] is a 68 y.o. 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   On presentation stable vitals, large amount of melena noted in his diaper.  Labs with leukocytosis at 14.4, hemoglobin 8.3 from baseline of 9, positive FOBT.  Potassium 2.8 Started on IV PPI, GI was consulted. 1 unit PRBC ordered  11/14: Vital stable with borderline blood pressure of 97/48, small improvement in leukocytosis to 13.7, hemoglobin improved to 9.6 s/p 1 unit of PRBC, potassium 3.0.  GI is planning EGD tomorrow, keep holding Eliquis .  Patient declined colonoscopy.  HD today.  11/15: Overnight received another unit of PRBC due to becoming hypotensive.  EGD with normal esophagus, large amount of food in stomach, no evidence of any bleeding.  GI is recommending colonoscopy but patient currently refusing.

## 2024-06-14 NOTE — H&P (View-Only) (Signed)
 Consultation Note   Referring Provider:   [facility] PCP: Patient, No Pcp Per Primary Gastroenterologist: Sampson  - seen inpatient only ( [facility])        Reason for Consultation:  GI bleed DOA: 06/13/2024         Hospital Day: 2   ASSESSMENT     68 yo male with a history of upper GI bleeding 2/2 to gastric AVMs / hemorrhagic gastritis in 2024 admitted now with black heme positive stool ( on iron ). Takes Eliquis   Hgb is stable. Presenting hgb 8.3 ( at baseline).  Patient tells me the blood in his diaper was bright red but nursing facility reported melena and EDP found melena in diaper.  I did a rectal exam and stool is black.. Suspect recurrent GI bleeding from AVMs/hemorrhagic gastritis.  Of course a colon lesion not excluded since patient has never had a colonoscopy  Chronic HFrEF Last 2D echo done on 08/24/2022 revealed LVEF 40 to 45%.   Afib, on Eliquis  Last dose of Eliquis ? Presumably 11/13  ESRD on HD MWF  Recent diagnosis of gangrene of tip of penis  DM2  See PMH for any additional medical history  / medical problems  Principal Problem:   GI bleed    PLAN:   --Eliquis  on hold --Continue BID pantoprazole  --Trend H/H -- Clear liquids today, n.p.o. after midnight --Schedule for EGD tomorrow. The risks and benefits of EGD with possible biopsies were discussed with the patient who agrees to proceed.  -- Again discussed the need for colonoscopy at some point and again he politely declines but will not give any reason for it so that I might be able to allay any fears or concerns about the procedure   HPI   Brief History:  68 yo male who we saw Dec 2024 in hospital for acute on chronic anemia and melena on Eliquis . EGD showed  a few small angioectasias with bleeding in the gastric fundus. Treatment with APC was NOT successful, moderate inflammation with hemorrhage  in the cardia and in the gastric fundus.  Biopsies showed reactive changes, no dysplasia or H.pylori.  The examined duodenum was normal. Duodenal biopsies negative for celiac or other abnormalities. She had never had a colonoscopy. We recommended one but he didn't want to proceed.   Interval History:  Patient brought to ED by EMS ( from Nursing facility) with melena. Hypotensive in ED ( low MAP), dose of Midodrine  ordered). Dark heme positive stool in ED. Has been on BID pantoprazole . On Eliquis , last dose presumably yesterday.  He denies abdominal pain, nausea or vomiting.  Patient does not know which medications that he takes.   Admission labs notable: for hgb of 8.3 , MCB 101.9, normal platelet count, K+ 2.8, Albumin  2.0, FOBT+                                                        Pertinent GI Studies   Dec 2024 EGD for melena - Normal esophagus.  - A few bleeding angioectasias in the stomach. Treatment not  successful. Treated with argon plasma coagulation (APC). -  --Gastritis with hemorrhage. Treated with APC and biopsied.  - Normal examined duodenum. Biopsied.   Labs and Imaging:  Recent Labs    06/13/24 1605 06/14/24 0330  PROT 6.1*  --   ALBUMIN  2.0* 1.9*  AST 17  --   ALT 11  --   ALKPHOS 92  --   BILITOT 0.6  --    Recent Labs    06/13/24 1605 06/13/24 2216 06/14/24 0330  WBC 14.4*  --  13.7*  HGB 8.3* 8.2* 9.6*  HCT 27.4* 27.0* 30.9*  MCV 101.9*  --  98.4  PLT 233  --  242   Recent Labs    06/13/24 1605 06/14/24 0330  NA 141 144  K 2.8* 3.0*  CL 94* 97*  CO2 31 28  GLUCOSE 130* 101*  BUN 13 17  CREATININE 2.60* 2.86*  CALCIUM  7.7* 8.0*     DG Chest Port 1 View CLINICAL DATA:  GI bleed  EXAM: PORTABLE CHEST 1 VIEW  COMPARISON:  Chest x-ray 03/22/2024  FINDINGS: There is minimal left basilar atelectasis or airspace disease. There is no pleural effusion or pneumothorax. The heart is mildly enlarged. Vascular stent is noted in the left axilla. No acute fractures are  identified.  IMPRESSION: 1. Minimal left basilar atelectasis or airspace disease. 2. Mild cardiomegaly.  Electronically Signed   By: Greig Pique M.D.   On: 06/13/2024 21:09     Past Medical History:  Diagnosis Date   CHF (congestive heart failure) (HCC)    Diabetes mellitus without complication (HCC)    PATIENT JUST LEARNED HE WAS DIABETIC   ESRD on hemodialysis (HCC)    TTS at [facility]   Hepatitis    Hyperlipemia    Hypertension    Necrotizing fasciitis (HCC)    Pneumonia    HX OF PNA    Past Surgical History:  Procedure Laterality Date   A/V FISTULAGRAM N/A 08/16/2023   Procedure: A/V Fistulagram;  Surgeon: Melia Lynwood ORN, MD;  Location: [facility] INVASIVE CV LAB;  Service: Cardiovascular;  Laterality: N/A;   AMPUTATION Left 09/11/2013   Procedure: AMPUTATION BELOW KNEE;  Surgeon: Kay Ozell Cummins, MD;  Location: [facility] OR;  Service: Orthopedics;  Laterality: Left;   AMPUTATION Right 12/18/2013   Procedure: RIGHT FOOT 1,2, TOE AMPUTATION  5th toe RAY AMPUTATION;  Surgeon: Kay Ozell Cummins, MD;  Location: [facility] OR;  Service: Orthopedics;  Laterality: Right;   ARTERY REPAIR Left 04/23/2024   Procedure: PLICATION OF AV FISTULA;  Surgeon: Lanis Fonda BRAVO, MD;  Location: [facility] OR;  Service: Vascular;  Laterality: Left;   AV FISTULA PLACEMENT Left 12/11/2019   Procedure: LEFT BRACHIOCEPHALIC FISTULA CREATION;  Surgeon: Oris Krystal FALCON, MD;  Location: [facility] OR;  Service: Vascular;  Laterality: Left;   BIOPSY  07/19/2023   Procedure: BIOPSY;  Surgeon: Albertus Gordy HERO, MD;  Location: [facility] ENDOSCOPY;  Service: Gastroenterology;;   CHOLECYSTECTOMY     ESOPHAGOGASTRODUODENOSCOPY (EGD) WITH PROPOFOL  N/A 07/19/2023   Procedure: ESOPHAGOGASTRODUODENOSCOPY (EGD) WITH PROPOFOL ;  Surgeon: Albertus Gordy HERO, MD;  Location: [facility] ENDOSCOPY;  Service: Gastroenterology;  Laterality: N/A;   HOT HEMOSTASIS N/A 07/19/2023   Procedure: HOT HEMOSTASIS (ARGON PLASMA COAGULATION/BICAP);  Surgeon: Albertus Gordy HERO, MD;  Location:  [facility] ENDOSCOPY;  Service: Gastroenterology;  Laterality: N/A;  stomach   I & D EXTREMITY Left 09/03/2013   Procedure: IRRIGATION AND DEBRIDEMENT EXTREMITY;  Surgeon: Kay Ozell Cummins, MD;  Location: [facility] OR;  Service: Orthopedics;  Laterality: Left;   I & D EXTREMITY Left 09/11/2013   Procedure: LEFT FOOT IRRIGATION AND DEBRIDEMENT;  Surgeon: Kay Ozell Cummins, MD;  Location: [facility] OR;  Service: Orthopedics;  Laterality: Left;   INSERTION OF DIALYSIS CATHETER  12/11/2019   Procedure: Insertion Of Dialysis Catheter;  Surgeon: Oris Krystal FALCON, MD;  Location: [facility] OR;  Service: Vascular;;   INSERTION OF DIALYSIS CATHETER Right 07/11/2022   Procedure: INSERTION OF TUNNELED DIALYSIS CATHETER;  Surgeon: Eliza Lonni RAMAN, MD;  Location: [facility] OR;  Service: Vascular;  Laterality: Right;   LEFT HEART CATH AND CORONARY ANGIOGRAPHY N/A 12/13/2019   Procedure: LEFT HEART CATH AND CORONARY ANGIOGRAPHY;  Surgeon: Dann Candyce RAMAN, MD;  Location: [facility] INVASIVE CV LAB;  Service: Cardiovascular;  Laterality: N/A;   PERIPHERAL VASCULAR BALLOON ANGIOPLASTY Left 08/16/2023   Procedure: PERIPHERAL VASCULAR BALLOON ANGIOPLASTY;  Surgeon: Melia Lynwood ORN, MD;  Location: [facility] INVASIVE CV LAB;  Service: Cardiovascular;  Laterality: Left;   REVISON OF ARTERIOVENOUS FISTULA Left 07/11/2022   Procedure: REVISON OF LEFT ARM ARTERIOVENOUS FISTULA;  Surgeon: Eliza Lonni RAMAN, MD;  Location: [facility] OR;  Service: Vascular;  Laterality: Left;   REVISON OF ARTERIOVENOUS FISTULA Left 04/23/2024   Procedure: REVISON OF ARTERIOVENOUS FISTULA;  Surgeon: Lanis Fonda BRAVO, MD;  Location: [facility] OR;  Service: Vascular;  Laterality: Left;   RIGHT HEART CATH N/A 12/13/2019   Procedure: RIGHT HEART CATH;  Surgeon: Dann Candyce RAMAN, MD;  Location: [facility] INVASIVE CV LAB;  Service: Cardiovascular;  Laterality: N/A;    Family History  Problem Relation Age of Onset   Diabetes type II Mother    Dementia Mother    Heart disease Father     Prior to  Admission medications   Medication Sig Start Date End Date Taking? Authorizing Provider  Amino Acids-Protein Hydrolys (PRO-STAT) LIQD Take 30 mLs by mouth 2 (two) times daily. (1000 & 1400)   Yes [provider]  amiodarone  (PACERONE ) 200 MG tablet Take 1 tablet (200 mg total) by mouth daily. 10/15/22 07/31/24 Yes McDiarmid, Krystal BIRCH, MD  apixaban  (ELIQUIS ) 2.5 MG TABS tablet Take 1 tablet (2.5 mg total) by mouth 2 (two) times daily. 04/25/24  Yes Schuh, McKenzi P, PA-C  atorvastatin  (LIPITOR) 20 MG tablet Take 1 tablet (20 mg total) by mouth daily. Patient taking differently: Take 20 mg by mouth at bedtime. 12/17/19  Yes Christian, Rylee, MD  bethanechol  (URECHOLINE ) 5 MG tablet Take 5 mg by mouth 3 (three) times daily. (0600, 1200 & 2000) 01/23/24  Yes [provider]  clotrimazole -betamethasone (LOTRISONE) cream Apply 1 Application topically daily. Apply topically to rash every day shift.   Yes [provider]  cyanocobalamin  1000 MCG tablet Take 1,000 mcg by mouth daily.   Yes [provider]  ferric citrate  (AURYXIA ) 1 GM 210 MG(Fe) tablet Take 210 mg by mouth with breakfast, with lunch, and with evening meal. 07/29/23  Yes [provider]  folic acid  (FOLVITE ) 1 MG tablet Take 1 mg by mouth in the morning.   Yes [provider]  hydrocortisone  cream 1 % Apply 1 Application topically daily as needed (rash). Applied to arm and elbow   Yes [provider]  liver oil-zinc oxide (DESITIN) 40 % ointment Apply 1 Application topically in the morning and at bedtime. Apply topically to buttocks and sacrum area every day and night shift for incontinence/preventative care.   Yes [provider]  loperamide (IMODIUM) 2 MG capsule Take 2 mg by mouth  as needed for diarrhea or loose stools.   Yes [provider]  metoprolol  tartrate (LOPRESSOR ) 25 MG tablet Take 12.5 mg by mouth every Tuesday, Thursday, and Saturday at 6 PM. Give 1/2  tablet (12.5mg ) by mouth three days a week on Tuesday, Thursday, Saturday. 01/23/24  Yes [provider]  oxycodone  (OXY-IR) 5 MG capsule Take 5 mg by mouth every 6 (six) hours as needed for pain.   Yes [provider]  OXYGEN Inhale 2 L/min into the lungs as needed (shortness of breath or maintain sats > 90%).   Yes [provider]  pantoprazole  (PROTONIX ) 40 MG tablet Take 1 tablet (40 mg total) by mouth 2 (two) times daily before a meal. 07/21/23  Yes Caleen Qualia, MD  promethazine  (PHENERGAN ) 25 MG/ML injection Inject 25 mg into the muscle every 4 (four) hours as needed for nausea or vomiting.   Yes [provider]  silver sulfADIAZINE (SILVADENE) 1 % cream Apply 1 Application topically 3 (three) times daily. 06/07/24  Yes [provider]  sodium zirconium cyclosilicate  (LOKELMA ) 10 g PACK packet Take 10 g by mouth 3 (three) times daily.   Yes [provider]  tamsulosin  (FLOMAX ) 0.4 MG CAPS capsule Take 0.4 mg by mouth every Monday, Wednesday, and Friday. Take one tablet by mouth 3 times a week on Monday, Wednesday and Fridays 12/20/22  Yes [provider]    Current Facility-Administered Medications  Medication Dose Route Frequency Provider Last Rate Last Admin   acetaminophen  (TYLENOL ) tablet 500 mg  500 mg Oral Q6H PRN Shona Terry SAILOR, DO       ceFEPIme  (MAXIPIME ) 1 g in sodium chloride  0.9 % 100 mL IVPB  1 g Intravenous Q24H Shona Terry N, DO   Stopped at 06/14/24 0413   Chlorhexidine  Gluconate Cloth 2 % PADS 6 each  6 each Topical Q0600 Melia Lynwood ORN, MD   6 each at 06/14/24 [phone]   hydrocerin (EUCERIN) cream   Topical BID Amin, Sumayya, MD       HYDROmorphone  (DILAUDID ) injection 0.5 mg  0.5 mg Intravenous Q4H PRN Shona Terry SAILOR, DO       liver oil-zinc oxide (DESITIN) 40 % ointment   Topical BID Amin, Sumayya, MD       melatonin tablet 5 mg  5 mg Oral QHS PRN Shona Terry SAILOR, DO       naloxone (NARCAN) injection 0.4 mg  0.4 mg  Intravenous PRN Shona Terry N, DO       oxyCODONE  (Oxy IR/ROXICODONE ) immediate release tablet 5-10 mg  5-10 mg Oral Q4H PRN Hall, Carole N, DO   10 mg at 06/14/24 0320   pantoprazole  (PROTONIX ) injection 40 mg  40 mg Intravenous BID Shona Terry N, DO   40 mg at 06/13/24 2211   polyethylene glycol (MIRALAX  / GLYCOLAX ) packet 17 g  17 g Oral Daily PRN Shona Terry N, DO       prochlorperazine  (COMPAZINE ) injection 5 mg  5 mg Intravenous Q6H PRN Shona Terry N, DO        Allergies as of 06/13/2024 - Review Complete 06/13/2024  Allergen Reaction Noted   Ultram  [tramadol ] Nausea And Vomiting 09/20/2013    Social History   Socioeconomic History   Marital status: Single    Spouse name: Not on file   Number of children: Not on file   Years of education: Not on file   Highest education level: Not on file  Occupational History   Not  on file  Tobacco Use   Smoking status: Never   Smokeless tobacco: Never  Vaping Use   Vaping status: Never Used  Substance and Sexual Activity   Alcohol use: No   Drug use: No   Sexual activity: Not on file  Other Topics Concern   Not on file  Social History Narrative   Lives in [facility]   Social Drivers of Health   Financial Resource Strain: Not on file  Food Insecurity: Patient Declined (03/23/2024)   Hunger Vital Sign    Worried About Running Out of Food in the Last Year: Patient declined    Ran Out of Food in the Last Year: Patient declined  Transportation Needs: Patient Unable To Answer (03/25/2024)   PRAPARE - Administrator, Civil Service (Medical): Patient unable to answer    Lack of Transportation (Non-Medical): Patient unable to answer  Physical Activity: Not on file  Stress: Not on file  Social Connections: Patient Unable To Answer (03/25/2024)   Social Connection and Isolation Panel    Frequency of Communication with Friends and Family: Patient unable to answer    Frequency of Social Gatherings with Friends and  Family: Patient unable to answer    Attends Religious Services: Patient unable to answer    Active Member of Clubs or Organizations: Patient unable to answer    Attends Banker Meetings: Patient unable to answer    Marital Status: Patient unable to answer  Intimate Partner Violence: Patient Declined (03/23/2024)   Humiliation, Afraid, Rape, and Kick questionnaire    Fear of Current or Ex-Partner: Patient declined    Emotionally Abused: Patient declined    Physically Abused: Patient declined    Sexually Abused: Patient declined     Code Status   Code Status: Full Code  Review of Systems: Positive for dry cough . All other systems reviewed and negative except where noted in HPI.  Physical Exam: Vital signs in last 24 hours: Temp:  [97.8 F (36.6 C)-98.3 F (36.8 C)] 98.1 F (36.7 C) (11/14 0815) Pulse Rate:  [68-78] 74 (11/14 0815) Resp:  [13-32] 20 (11/14 0815) BP: (88-119)/(30-60) 97/48 (11/14 0815) SpO2:  [93 %-100 %] 93 % (11/14 0815) Weight:  [52.4 kg-54.4 kg] 52.4 kg (11/14 0614) Last BM Date : 06/14/24  General:  Pleasant male in NAD Psych:  Cooperative. Normal mood and affect Eyes: Pupils equal Ears:  Normal auditory acuity Nose: No deformity, discharge or lesions Neck:  Supple, no masses felt Lungs:  Clear to auscultation.  Heart:  Regular rate, regular rhythm.  Abdomen:  Soft, nondistended, nontender, active bowel sounds, no masses felt Rectal : Black soft stool in vault  Msk: Symmetrical without gross deformities.  Neurologic:  Alert, oriented, grossly normal neurologically Extremities : Left BKA    Intake/Output from previous day: 11/13 0701 - 11/14 0700 In: 100 [IV Piggyback:100] Out: -  Intake/Output this shift:  No intake/output data recorded.   Vina Dasen, NP-C   06/14/2024, 9:19 AM

## 2024-06-14 NOTE — Progress Notes (Signed)
 Pt receives out-pt HD at [facility], MWF, 1025am chair time. Will continue to assists as needed.   Lavanda Saarah Dewing Dialysis Navigator [phone]

## 2024-06-14 NOTE — Progress Notes (Signed)
 Progress Note   Patient: [name] FMW:[id number] DOB: 1955-12-10 DOA: 06/13/2024     1 DOS: the patient was seen and examined on 06/14/2024   Brief hospital course: Partly taken from H&P.  [name] is a 68 y.o. 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   On presentation stable vitals, large amount of melena noted in his diaper.  Labs with leukocytosis at 14.4, hemoglobin 8.3 from baseline of 9, positive FOBT.  Potassium 2.8 Started on IV PPI, GI was consulted. 1 unit PRBC ordered  11/14: Vital stable with borderline blood pressure of 97/48, small improvement in leukocytosis to 13.7, hemoglobin improved to 9.6 s/p 1 unit of PRBC, potassium 3.0.  GI is planning EGD tomorrow, keep holding Eliquis .  Patient declined colonoscopy.  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 Hemoglobin with some improvement to 9.6 s/p 1 unit of PRBC.  GI is planning EGD tomorrow.  Colonoscopy was also discussed but patient declined.  - Continue with IV Protonix  -Clear liquid diet today -Keep holding Eliquis  -Monitor hemoglobin -Transfuse if below 8  Paroxysmal atrial fibrillation (HCC) Currently in sinus rhythm. -Continuing home amiodarone  -Holding metoprolol  due to softer blood pressure -Holding home Eliquis  due to GI bleed  Chronic HFrEF (heart failure with reduced ejection fraction) (HCC) Prior echo done on 08/20/2022 with EF of 40 to 45%.  Clinically appears euvolemic. - Volume is  being managed with dialysis  Gangrene of penis Concern of superadded cellulitis. Recently diagnosed with gangrene of the tip of penis at urology office. - Started on broad-spectrum antibiotics with cefepime  and vancomycin  for concern of superadded cellulitis. - Continue with supportive care  Hypotension Blood pressure soft. - Ordered 1 more dose of midodrine  before dialysis today. - Holding metoprolol   ESRD on hemodialysis [facility]) Nephrology is on board and going for his scheduled dialysis today.  Hyperlipemia - Continue with home statin  Pressure injury Patient was found to have multiple unstageable pressure injuries, POA. Involving buttock, ischium and heel. -Wound care was consulted  BPH (benign prostatic hyperplasia) - Continue Flomax       Subjective: Patient was complaining of lower back pain when seen today.  Had another melanotic stool earlier today.  Physical Exam: Vitals:   06/14/24 0541 06/14/24 0614 06/14/24 0618 06/14/24 0815  BP: (!) 119/44  (!) 88/60 (!) 97/48  Pulse: 69 74 74 74  Resp: (!) 31  18 20   Temp:  97.8 F (36.6 C)  98.1 F (36.7 C)  TempSrc: Oral Oral  Oral  SpO2: 100%  98% 93%  Weight:  52.4 kg    Height:       General.  Frail and malnourished elderly man, in no acute distress. Pulmonary.  Lungs clear bilaterally, normal respiratory effort. CV.  Regular rate and rhythm, no JVD, rub or murmur. Abdomen.  Soft, nontender, nondistended, BS positive. CNS.  Alert and oriented .  No focal neurologic deficit. Extremities.  No edema,pulses intact and symmetrical. Psychiatry.  Judgment and insight appears normal.  Data Reviewed: Prior data reviewed  Family Communication: Discussed with patient  Disposition: Status is: Inpatient Remains inpatient appropriate because: Severity of illness  Planned Discharge Destination: Skilled nursing facility  DVT prophylaxis.  SCDs Time spent: 50 minutes  This record has been created using Software engineer. Errors have been sought and corrected,but may not always be located. Such creation errors do not reflect on the standard of care.   Author: Amaryllis Dare, MD 06/14/2024 12:00 PM  For on call review [email or web address].

## 2024-06-14 NOTE — Assessment & Plan Note (Signed)
 Blood pressure soft. - Ordered 1 more dose of midodrine  before dialysis today. - Holding metoprolol 

## 2024-06-14 NOTE — Consult Note (Signed)
 Renal Service Consult Note [facility] Lamar JONETTA Fret, MD  Patient: [name] Date: 06/14/2024 Requesting Physician: Dr. Caleen   Reason for Consult: ESRD pt w/ bright red blood per rectum HPI: The patient is a 68 y.o. year-old w/ PMH as below who presented to ER from his facility reporting right red blood in his stool.  Patient has ESRD on HD last dialysis Wednesday.  Also reduced LV function 40 to 45%, HL, HTN, A-fib, history of GI bleed.  In the ER patient had a large amount of melena in his diaper.  Hemoglobin was 8.3, K+ 3.0, creatinine 2.86.  White blood count 13.7.  Blood pressure was 115/44, HR 74, RR 18, temp 98.2, 99% O2 sat on room air.  Chest x-ray showed minimal left basilar at atelectasis versus infiltrate.  Patient was admitted for GI bleed.  We are asked to see for dialysis.   Pt seen in room.  He has no complaints at this time.  Feels a lot better.   ROS - denies CP, no joint pain, no HA, no blurry vision, no rash, no diarrhea, no nausea/ vomiting   Past Medical History  Past Medical History:  Diagnosis Date   CHF (congestive heart failure) (HCC)    Diabetes mellitus without complication (HCC)    PATIENT JUST LEARNED HE WAS DIABETIC   ESRD on hemodialysis (HCC)    TTS at [facility]   Hepatitis    Hyperlipemia    Hypertension    Necrotizing fasciitis (HCC)    Pneumonia    HX OF PNA   Past Surgical History  Past Surgical History:  Procedure Laterality Date   A/V FISTULAGRAM N/A 08/16/2023   Procedure: A/V Fistulagram;  Surgeon: Melia Lynwood ORN, MD;  Location: [facility] INVASIVE CV LAB;  Service: Cardiovascular;  Laterality: N/A;   AMPUTATION Left 09/11/2013   Procedure: AMPUTATION BELOW KNEE;  Surgeon: Kay Ozell Cummins, MD;  Location: [facility] OR;  Service: Orthopedics;  Laterality: Left;   AMPUTATION Right 12/18/2013   Procedure: RIGHT FOOT 1,2, TOE AMPUTATION  5th toe RAY AMPUTATION;  Surgeon: Kay Ozell Cummins, MD;  Location: [facility] OR;  Service:  Orthopedics;  Laterality: Right;   ARTERY REPAIR Left 04/23/2024   Procedure: PLICATION OF AV FISTULA;  Surgeon: Lanis Fonda BRAVO, MD;  Location: [facility] OR;  Service: Vascular;  Laterality: Left;   AV FISTULA PLACEMENT Left 12/11/2019   Procedure: LEFT BRACHIOCEPHALIC FISTULA CREATION;  Surgeon: Oris Krystal FALCON, MD;  Location: [facility] OR;  Service: Vascular;  Laterality: Left;   BIOPSY  07/19/2023   Procedure: BIOPSY;  Surgeon: Albertus Gordy HERO, MD;  Location: [facility] ENDOSCOPY;  Service: Gastroenterology;;   CHOLECYSTECTOMY     ESOPHAGOGASTRODUODENOSCOPY (EGD) WITH PROPOFOL  N/A 07/19/2023   Procedure: ESOPHAGOGASTRODUODENOSCOPY (EGD) WITH PROPOFOL ;  Surgeon: Albertus Gordy HERO, MD;  Location: [facility] ENDOSCOPY;  Service: Gastroenterology;  Laterality: N/A;   HOT HEMOSTASIS N/A 07/19/2023   Procedure: HOT HEMOSTASIS (ARGON PLASMA COAGULATION/BICAP);  Surgeon: Albertus Gordy HERO, MD;  Location: [facility] ENDOSCOPY;  Service: Gastroenterology;  Laterality: N/A;  stomach   I & D EXTREMITY Left 09/03/2013   Procedure: IRRIGATION AND DEBRIDEMENT EXTREMITY;  Surgeon: Kay Ozell Cummins, MD;  Location: [facility] OR;  Service: Orthopedics;  Laterality: Left;   I & D EXTREMITY Left 09/11/2013   Procedure: LEFT FOOT IRRIGATION AND DEBRIDEMENT;  Surgeon: Kay Ozell Cummins, MD;  Location: [facility] OR;  Service: Orthopedics;  Laterality: Left;   INSERTION OF DIALYSIS CATHETER  12/11/2019   Procedure: Insertion Of Dialysis  Catheter;  Surgeon: Oris Krystal FALCON, MD;  Location: [facility] OR;  Service: Vascular;;   INSERTION OF DIALYSIS CATHETER Right 07/11/2022   Procedure: INSERTION OF TUNNELED DIALYSIS CATHETER;  Surgeon: Eliza Lonni RAMAN, MD;  Location: [facility] OR;  Service: Vascular;  Laterality: Right;   LEFT HEART CATH AND CORONARY ANGIOGRAPHY N/A 12/13/2019   Procedure: LEFT HEART CATH AND CORONARY ANGIOGRAPHY;  Surgeon: Dann Candyce RAMAN, MD;  Location: [facility] INVASIVE CV LAB;  Service: Cardiovascular;  Laterality: N/A;   PERIPHERAL VASCULAR BALLOON ANGIOPLASTY Left  08/16/2023   Procedure: PERIPHERAL VASCULAR BALLOON ANGIOPLASTY;  Surgeon: Melia Lynwood ORN, MD;  Location: [facility] INVASIVE CV LAB;  Service: Cardiovascular;  Laterality: Left;   REVISON OF ARTERIOVENOUS FISTULA Left 07/11/2022   Procedure: REVISON OF LEFT ARM ARTERIOVENOUS FISTULA;  Surgeon: Eliza Lonni RAMAN, MD;  Location: [facility] OR;  Service: Vascular;  Laterality: Left;   REVISON OF ARTERIOVENOUS FISTULA Left 04/23/2024   Procedure: REVISON OF ARTERIOVENOUS FISTULA;  Surgeon: Lanis Fonda BRAVO, MD;  Location: [facility] OR;  Service: Vascular;  Laterality: Left;   RIGHT HEART CATH N/A 12/13/2019   Procedure: RIGHT HEART CATH;  Surgeon: Dann Candyce RAMAN, MD;  Location: [facility] INVASIVE CV LAB;  Service: Cardiovascular;  Laterality: N/A;   Family History  Family History  Problem Relation Age of Onset   Diabetes type II Mother    Dementia Mother    Heart disease Father    Social History  reports that he has never smoked. He has never used smokeless tobacco. He reports that he does not drink alcohol and does not use drugs. Allergies  Allergies  Allergen Reactions   Ultram  [Tramadol ] Nausea And Vomiting   Home medications Prior to Admission medications   Medication Sig Start Date End Date Taking? Authorizing Provider  Amino Acids-Protein Hydrolys (PRO-STAT) LIQD Take 30 mLs by mouth 2 (two) times daily. (1000 & 1400)   Yes [provider]  amiodarone  (PACERONE ) 200 MG tablet Take 1 tablet (200 mg total) by mouth daily. 10/15/22 07/31/24 Yes McDiarmid, Krystal BIRCH, MD  apixaban  (ELIQUIS ) 2.5 MG TABS tablet Take 1 tablet (2.5 mg total) by mouth 2 (two) times daily. 04/25/24  Yes Schuh, McKenzi P, PA-C  atorvastatin  (LIPITOR) 20 MG tablet Take 1 tablet (20 mg total) by mouth daily. Patient taking differently: Take 20 mg by mouth at bedtime. 12/17/19  Yes Christian, Rylee, MD  bethanechol  (URECHOLINE ) 5 MG tablet Take 5 mg by mouth 3 (three) times daily. (0600, 1200 & 2000) 01/23/24  Yes [provider]  clotrimazole -betamethasone (LOTRISONE) cream Apply 1 Application topically daily. Apply topically to rash every day shift.   Yes [provider]  cyanocobalamin  1000 MCG tablet Take 1,000 mcg by mouth daily.   Yes [provider]  ferric citrate  (AURYXIA ) 1 GM 210 MG(Fe) tablet Take 210 mg by mouth with breakfast, with lunch, and with evening meal. 07/29/23  Yes [provider]  folic acid  (FOLVITE ) 1 MG tablet Take 1 mg by mouth in the morning.   Yes [provider]  hydrocortisone  cream 1 % Apply 1 Application topically daily as needed (rash). Applied to arm and elbow   Yes [provider]  liver oil-zinc oxide (DESITIN) 40 % ointment Apply 1 Application topically in the morning and at bedtime. Apply topically to buttocks and sacrum area every day and night shift for incontinence/preventative care.   Yes [provider]  loperamide (IMODIUM) 2 MG capsule Take 2 mg by mouth as needed for diarrhea  or loose stools.   Yes [provider]  metoprolol  tartrate (LOPRESSOR ) 25 MG tablet Take 12.5 mg by mouth every Tuesday, Thursday, and Saturday at 6 PM. Give 1/2 tablet (12.5mg ) by mouth three days a week on Tuesday, Thursday, Saturday. 01/23/24  Yes [provider]  oxycodone  (OXY-IR) 5 MG capsule Take 5 mg by mouth every 6 (six) hours as needed for pain.   Yes [provider]  OXYGEN Inhale 2 L/min into the lungs as needed (shortness of breath or maintain sats > 90%).   Yes [provider]  pantoprazole  (PROTONIX ) 40 MG tablet Take 1 tablet (40 mg total) by mouth 2 (two) times daily before a meal. 07/21/23  Yes Caleen Qualia, MD  promethazine  (PHENERGAN ) 25 MG/ML injection Inject 25 mg into the muscle every 4 (four) hours as needed for nausea or vomiting.   Yes [provider]  silver sulfADIAZINE (SILVADENE) 1 % cream Apply 1 Application topically 3 (three) times daily. 06/07/24  Yes [provider]  sodium zirconium cyclosilicate  (LOKELMA ) 10 g PACK packet Take 10 g by mouth 3 (three) times daily.   Yes [provider]  tamsulosin  (FLOMAX ) 0.4 MG CAPS capsule Take 0.4 mg by mouth every Monday, Wednesday, and Friday. Take one tablet by mouth 3 times a week on Monday, Wednesday and Fridays 12/20/22  Yes [provider]     Vitals:   06/14/24 0541 06/14/24 0614 06/14/24 0618 06/14/24 0815  BP: (!) 119/44  (!) 88/60 (!) 97/48  Pulse: 69 74 74 74  Resp: (!) 31  18 20   Temp:  97.8 F (36.6 C)  98.1 F (36.7 C)  TempSrc: Oral Oral  Oral  SpO2: 100%  98% 93%  Weight:  52.4 kg    Height:       Exam Gen alert, no distress Sclera anicteric, throat clear  No jvd or bruits Chest clear bilat to bases RRR no MRG Abd soft ntnd no mass or ascites +bs Ext no LE or UE edema, no other edema Neuro is alert, Ox 3 , nf    LUA AVF+bruit   Home bp meds: Home O2 Metoprolol  12.5 mg    OP HD: [facility] MWF 3h B400  52.3kg  AVF  Heparin  none Last OP HD 11/12, post wt 51.3kg Mircera 225 mcg q 2wks, last 11/12, due 11/26 Old Hb's: 8.0 on 11/12, 9.0 on 11.06, 8.9 on 10/29    Assessment/ Plan: GI bleed: per GI/ pmd. Hb 8-10 range.  ESRD: on HD MWF. Has not missed HD. Plan HD today.  BP: BP's low-normal to soft. Hold any bp lowering meds if possible.  Volume: euvolemic on exam, was under dry wt by 1kg after last OP HD on Wed.  Anemia of esrd: Hb 8, next esa due 11/26.     Myer Fret  MD [facility] 06/14/2024, 11:15 AM  Recent Labs  Lab 06/13/24 1605 06/13/24 2216 06/14/24 0330  HGB 8.3* 8.2* 9.6*  ALBUMIN  2.0*  --  1.9*  CALCIUM  7.7*  --  8.0*  PHOS  --   --  3.5  CREATININE 2.60*  --  2.86*  K 2.8*  --  3.0*   Inpatient medications:  Chlorhexidine  Gluconate Cloth  6 each Topical Q0600   hydrocerin   Topical BID   liver oil-zinc oxide   Topical BID   pantoprazole  (PROTONIX ) IV  40 mg Intravenous BID    ceFEPime  (MAXIPIME ) IV Stopped (06/14/24 0413)    acetaminophen , HYDROmorphone  (  DILAUDID ) injection, melatonin, naLOXone (NARCAN)  injection, oxyCODONE , polyethylene glycol, prochlorperazine 

## 2024-06-14 NOTE — Assessment & Plan Note (Signed)
 Nephrology is on board and going for his scheduled dialysis today.

## 2024-06-14 NOTE — Assessment & Plan Note (Signed)
 Currently in sinus rhythm. -Continuing home amiodarone  -Holding metoprolol  due to softer blood pressure -Holding home Eliquis  due to GI bleed

## 2024-06-14 NOTE — Assessment & Plan Note (Signed)
-   Continue with home statin ?

## 2024-06-14 NOTE — Assessment & Plan Note (Signed)
 Concern of superadded cellulitis. Recently diagnosed with gangrene of the tip of penis at urology office. - Started on broad-spectrum antibiotics with cefepime  and vancomycin  for concern of superadded cellulitis. - Continue with supportive care

## 2024-06-14 NOTE — Consult Note (Signed)
 Consultation Note   Referring Provider:   [facility] PCP: Patient, No Pcp Per Primary Gastroenterologist: Sampson  - seen inpatient only ( [facility])        Reason for Consultation:  GI bleed DOA: 06/13/2024         Hospital Day: 2   ASSESSMENT     68 yo male with a history of upper GI bleeding 2/2 to gastric AVMs / hemorrhagic gastritis in 2024 admitted now with black heme positive stool ( on iron ). Takes Eliquis   Hgb is stable. Presenting hgb 8.3 ( at baseline).  Patient tells me the blood in his diaper was bright red but nursing facility reported melena and EDP found melena in diaper.  I did a rectal exam and stool is black.. Suspect recurrent GI bleeding from AVMs/hemorrhagic gastritis.  Of course a colon lesion not excluded since patient has never had a colonoscopy  Chronic HFrEF Last 2D echo done on 08/24/2022 revealed LVEF 40 to 45%.   Afib, on Eliquis  Last dose of Eliquis ? Presumably 11/13  ESRD on HD MWF  Recent diagnosis of gangrene of tip of penis  DM2  See PMH for any additional medical history  / medical problems  Principal Problem:   GI bleed    PLAN:   --Eliquis  on hold --Continue BID pantoprazole  --Trend H/H -- Clear liquids today, n.p.o. after midnight --Schedule for EGD tomorrow. The risks and benefits of EGD with possible biopsies were discussed with the patient who agrees to proceed.  -- Again discussed the need for colonoscopy at some point and again he politely declines but will not give any reason for it so that I might be able to allay any fears or concerns about the procedure   HPI   Brief History:  68 yo male who we saw Dec 2024 in hospital for acute on chronic anemia and melena on Eliquis . EGD showed  a few small angioectasias with bleeding in the gastric fundus. Treatment with APC was NOT successful, moderate inflammation with hemorrhage  in the cardia and in the gastric fundus.  Biopsies showed reactive changes, no dysplasia or H.pylori.  The examined duodenum was normal. Duodenal biopsies negative for celiac or other abnormalities. She had never had a colonoscopy. We recommended one but he didn't want to proceed.   Interval History:  Patient brought to ED by EMS ( from Nursing facility) with melena. Hypotensive in ED ( low MAP), dose of Midodrine  ordered). Dark heme positive stool in ED. Has been on BID pantoprazole . On Eliquis , last dose presumably yesterday.  He denies abdominal pain, nausea or vomiting.  Patient does not know which medications that he takes.   Admission labs notable: for hgb of 8.3 , MCB 101.9, normal platelet count, K+ 2.8, Albumin  2.0, FOBT+                                                        Pertinent GI Studies   Dec 2024 EGD for melena - Normal esophagus.  - A few bleeding angioectasias in the stomach. Treatment not  successful. Treated with argon plasma coagulation (APC). -  --Gastritis with hemorrhage. Treated with APC and biopsied.  - Normal examined duodenum. Biopsied.   Labs and Imaging:  Recent Labs    06/13/24 1605 06/14/24 0330  PROT 6.1*  --   ALBUMIN  2.0* 1.9*  AST 17  --   ALT 11  --   ALKPHOS 92  --   BILITOT 0.6  --    Recent Labs    06/13/24 1605 06/13/24 2216 06/14/24 0330  WBC 14.4*  --  13.7*  HGB 8.3* 8.2* 9.6*  HCT 27.4* 27.0* 30.9*  MCV 101.9*  --  98.4  PLT 233  --  242   Recent Labs    06/13/24 1605 06/14/24 0330  NA 141 144  K 2.8* 3.0*  CL 94* 97*  CO2 31 28  GLUCOSE 130* 101*  BUN 13 17  CREATININE 2.60* 2.86*  CALCIUM  7.7* 8.0*     DG Chest Port 1 View CLINICAL DATA:  GI bleed  EXAM: PORTABLE CHEST 1 VIEW  COMPARISON:  Chest x-ray 03/22/2024  FINDINGS: There is minimal left basilar atelectasis or airspace disease. There is no pleural effusion or pneumothorax. The heart is mildly enlarged. Vascular stent is noted in the left axilla. No acute fractures are  identified.  IMPRESSION: 1. Minimal left basilar atelectasis or airspace disease. 2. Mild cardiomegaly.  Electronically Signed   By: Greig Pique M.D.   On: 06/13/2024 21:09     Past Medical History:  Diagnosis Date   CHF (congestive heart failure) (HCC)    Diabetes mellitus without complication (HCC)    PATIENT JUST LEARNED HE WAS DIABETIC   ESRD on hemodialysis (HCC)    TTS at [facility]   Hepatitis    Hyperlipemia    Hypertension    Necrotizing fasciitis (HCC)    Pneumonia    HX OF PNA    Past Surgical History:  Procedure Laterality Date   A/V FISTULAGRAM N/A 08/16/2023   Procedure: A/V Fistulagram;  Surgeon: Melia Lynwood ORN, MD;  Location: [facility] INVASIVE CV LAB;  Service: Cardiovascular;  Laterality: N/A;   AMPUTATION Left 09/11/2013   Procedure: AMPUTATION BELOW KNEE;  Surgeon: Kay Ozell Cummins, MD;  Location: [facility] OR;  Service: Orthopedics;  Laterality: Left;   AMPUTATION Right 12/18/2013   Procedure: RIGHT FOOT 1,2, TOE AMPUTATION  5th toe RAY AMPUTATION;  Surgeon: Kay Ozell Cummins, MD;  Location: [facility] OR;  Service: Orthopedics;  Laterality: Right;   ARTERY REPAIR Left 04/23/2024   Procedure: PLICATION OF AV FISTULA;  Surgeon: Lanis Fonda BRAVO, MD;  Location: [facility] OR;  Service: Vascular;  Laterality: Left;   AV FISTULA PLACEMENT Left 12/11/2019   Procedure: LEFT BRACHIOCEPHALIC FISTULA CREATION;  Surgeon: Oris Krystal FALCON, MD;  Location: [facility] OR;  Service: Vascular;  Laterality: Left;   BIOPSY  07/19/2023   Procedure: BIOPSY;  Surgeon: Albertus Gordy HERO, MD;  Location: [facility] ENDOSCOPY;  Service: Gastroenterology;;   CHOLECYSTECTOMY     ESOPHAGOGASTRODUODENOSCOPY (EGD) WITH PROPOFOL  N/A 07/19/2023   Procedure: ESOPHAGOGASTRODUODENOSCOPY (EGD) WITH PROPOFOL ;  Surgeon: Albertus Gordy HERO, MD;  Location: [facility] ENDOSCOPY;  Service: Gastroenterology;  Laterality: N/A;   HOT HEMOSTASIS N/A 07/19/2023   Procedure: HOT HEMOSTASIS (ARGON PLASMA COAGULATION/BICAP);  Surgeon: Albertus Gordy HERO, MD;  Location:  [facility] ENDOSCOPY;  Service: Gastroenterology;  Laterality: N/A;  stomach   I & D EXTREMITY Left 09/03/2013   Procedure: IRRIGATION AND DEBRIDEMENT EXTREMITY;  Surgeon: Kay Ozell Cummins, MD;  Location: [facility] OR;  Service: Orthopedics;  Laterality: Left;   I & D EXTREMITY Left 09/11/2013   Procedure: LEFT FOOT IRRIGATION AND DEBRIDEMENT;  Surgeon: Kay Ozell Cummins, MD;  Location: [facility] OR;  Service: Orthopedics;  Laterality: Left;   INSERTION OF DIALYSIS CATHETER  12/11/2019   Procedure: Insertion Of Dialysis Catheter;  Surgeon: Oris Krystal FALCON, MD;  Location: [facility] OR;  Service: Vascular;;   INSERTION OF DIALYSIS CATHETER Right 07/11/2022   Procedure: INSERTION OF TUNNELED DIALYSIS CATHETER;  Surgeon: Eliza Lonni RAMAN, MD;  Location: [facility] OR;  Service: Vascular;  Laterality: Right;   LEFT HEART CATH AND CORONARY ANGIOGRAPHY N/A 12/13/2019   Procedure: LEFT HEART CATH AND CORONARY ANGIOGRAPHY;  Surgeon: Dann Candyce RAMAN, MD;  Location: [facility] INVASIVE CV LAB;  Service: Cardiovascular;  Laterality: N/A;   PERIPHERAL VASCULAR BALLOON ANGIOPLASTY Left 08/16/2023   Procedure: PERIPHERAL VASCULAR BALLOON ANGIOPLASTY;  Surgeon: Melia Lynwood ORN, MD;  Location: [facility] INVASIVE CV LAB;  Service: Cardiovascular;  Laterality: Left;   REVISON OF ARTERIOVENOUS FISTULA Left 07/11/2022   Procedure: REVISON OF LEFT ARM ARTERIOVENOUS FISTULA;  Surgeon: Eliza Lonni RAMAN, MD;  Location: [facility] OR;  Service: Vascular;  Laterality: Left;   REVISON OF ARTERIOVENOUS FISTULA Left 04/23/2024   Procedure: REVISON OF ARTERIOVENOUS FISTULA;  Surgeon: Lanis Fonda BRAVO, MD;  Location: [facility] OR;  Service: Vascular;  Laterality: Left;   RIGHT HEART CATH N/A 12/13/2019   Procedure: RIGHT HEART CATH;  Surgeon: Dann Candyce RAMAN, MD;  Location: [facility] INVASIVE CV LAB;  Service: Cardiovascular;  Laterality: N/A;    Family History  Problem Relation Age of Onset   Diabetes type II Mother    Dementia Mother    Heart disease Father     Prior to  Admission medications   Medication Sig Start Date End Date Taking? Authorizing Provider  Amino Acids-Protein Hydrolys (PRO-STAT) LIQD Take 30 mLs by mouth 2 (two) times daily. (1000 & 1400)   Yes [provider]  amiodarone  (PACERONE ) 200 MG tablet Take 1 tablet (200 mg total) by mouth daily. 10/15/22 07/31/24 Yes McDiarmid, Krystal BIRCH, MD  apixaban  (ELIQUIS ) 2.5 MG TABS tablet Take 1 tablet (2.5 mg total) by mouth 2 (two) times daily. 04/25/24  Yes Schuh, McKenzi P, PA-C  atorvastatin  (LIPITOR) 20 MG tablet Take 1 tablet (20 mg total) by mouth daily. Patient taking differently: Take 20 mg by mouth at bedtime. 12/17/19  Yes Christian, Rylee, MD  bethanechol  (URECHOLINE ) 5 MG tablet Take 5 mg by mouth 3 (three) times daily. (0600, 1200 & 2000) 01/23/24  Yes [provider]  clotrimazole -betamethasone (LOTRISONE) cream Apply 1 Application topically daily. Apply topically to rash every day shift.   Yes [provider]  cyanocobalamin  1000 MCG tablet Take 1,000 mcg by mouth daily.   Yes [provider]  ferric citrate  (AURYXIA ) 1 GM 210 MG(Fe) tablet Take 210 mg by mouth with breakfast, with lunch, and with evening meal. 07/29/23  Yes [provider]  folic acid  (FOLVITE ) 1 MG tablet Take 1 mg by mouth in the morning.   Yes [provider]  hydrocortisone  cream 1 % Apply 1 Application topically daily as needed (rash). Applied to arm and elbow   Yes [provider]  liver oil-zinc oxide (DESITIN) 40 % ointment Apply 1 Application topically in the morning and at bedtime. Apply topically to buttocks and sacrum area every day and night shift for incontinence/preventative care.   Yes [provider]  loperamide (IMODIUM) 2 MG capsule Take 2 mg by mouth  as needed for diarrhea or loose stools.   Yes [provider]  metoprolol  tartrate (LOPRESSOR ) 25 MG tablet Take 12.5 mg by mouth every Tuesday, Thursday, and Saturday at 6 PM. Give 1/2  tablet (12.5mg ) by mouth three days a week on Tuesday, Thursday, Saturday. 01/23/24  Yes [provider]  oxycodone  (OXY-IR) 5 MG capsule Take 5 mg by mouth every 6 (six) hours as needed for pain.   Yes [provider]  OXYGEN Inhale 2 L/min into the lungs as needed (shortness of breath or maintain sats > 90%).   Yes [provider]  pantoprazole  (PROTONIX ) 40 MG tablet Take 1 tablet (40 mg total) by mouth 2 (two) times daily before a meal. 07/21/23  Yes Caleen Qualia, MD  promethazine  (PHENERGAN ) 25 MG/ML injection Inject 25 mg into the muscle every 4 (four) hours as needed for nausea or vomiting.   Yes [provider]  silver sulfADIAZINE (SILVADENE) 1 % cream Apply 1 Application topically 3 (three) times daily. 06/07/24  Yes [provider]  sodium zirconium cyclosilicate  (LOKELMA ) 10 g PACK packet Take 10 g by mouth 3 (three) times daily.   Yes [provider]  tamsulosin  (FLOMAX ) 0.4 MG CAPS capsule Take 0.4 mg by mouth every Monday, Wednesday, and Friday. Take one tablet by mouth 3 times a week on Monday, Wednesday and Fridays 12/20/22  Yes [provider]    Current Facility-Administered Medications  Medication Dose Route Frequency Provider Last Rate Last Admin   acetaminophen  (TYLENOL ) tablet 500 mg  500 mg Oral Q6H PRN Shona Terry SAILOR, DO       ceFEPIme  (MAXIPIME ) 1 g in sodium chloride  0.9 % 100 mL IVPB  1 g Intravenous Q24H Shona Terry N, DO   Stopped at 06/14/24 0413   Chlorhexidine  Gluconate Cloth 2 % PADS 6 each  6 each Topical Q0600 Melia Lynwood ORN, MD   6 each at 06/14/24 [phone]   hydrocerin (EUCERIN) cream   Topical BID Amin, Sumayya, MD       HYDROmorphone  (DILAUDID ) injection 0.5 mg  0.5 mg Intravenous Q4H PRN Shona Terry SAILOR, DO       liver oil-zinc oxide (DESITIN) 40 % ointment   Topical BID Amin, Sumayya, MD       melatonin tablet 5 mg  5 mg Oral QHS PRN Shona Terry SAILOR, DO       naloxone (NARCAN) injection 0.4 mg  0.4 mg  Intravenous PRN Shona Terry N, DO       oxyCODONE  (Oxy IR/ROXICODONE ) immediate release tablet 5-10 mg  5-10 mg Oral Q4H PRN Hall, Carole N, DO   10 mg at 06/14/24 0320   pantoprazole  (PROTONIX ) injection 40 mg  40 mg Intravenous BID Shona Terry N, DO   40 mg at 06/13/24 2211   polyethylene glycol (MIRALAX  / GLYCOLAX ) packet 17 g  17 g Oral Daily PRN Shona Terry N, DO       prochlorperazine  (COMPAZINE ) injection 5 mg  5 mg Intravenous Q6H PRN Shona Terry N, DO        Allergies as of 06/13/2024 - Review Complete 06/13/2024  Allergen Reaction Noted   Ultram  [tramadol ] Nausea And Vomiting 09/20/2013    Social History   Socioeconomic History   Marital status: Single    Spouse name: Not on file   Number of children: Not on file   Years of education: Not on file   Highest education level: Not on file  Occupational History   Not  on file  Tobacco Use   Smoking status: Never   Smokeless tobacco: Never  Vaping Use   Vaping status: Never Used  Substance and Sexual Activity   Alcohol use: No   Drug use: No   Sexual activity: Not on file  Other Topics Concern   Not on file  Social History Narrative   Lives in [facility]   Social Drivers of Health   Financial Resource Strain: Not on file  Food Insecurity: Patient Declined (03/23/2024)   Hunger Vital Sign    Worried About Running Out of Food in the Last Year: Patient declined    Ran Out of Food in the Last Year: Patient declined  Transportation Needs: Patient Unable To Answer (03/25/2024)   PRAPARE - Administrator, Civil Service (Medical): Patient unable to answer    Lack of Transportation (Non-Medical): Patient unable to answer  Physical Activity: Not on file  Stress: Not on file  Social Connections: Patient Unable To Answer (03/25/2024)   Social Connection and Isolation Panel    Frequency of Communication with Friends and Family: Patient unable to answer    Frequency of Social Gatherings with Friends and  Family: Patient unable to answer    Attends Religious Services: Patient unable to answer    Active Member of Clubs or Organizations: Patient unable to answer    Attends Banker Meetings: Patient unable to answer    Marital Status: Patient unable to answer  Intimate Partner Violence: Patient Declined (03/23/2024)   Humiliation, Afraid, Rape, and Kick questionnaire    Fear of Current or Ex-Partner: Patient declined    Emotionally Abused: Patient declined    Physically Abused: Patient declined    Sexually Abused: Patient declined     Code Status   Code Status: Full Code  Review of Systems: Positive for dry cough . All other systems reviewed and negative except where noted in HPI.  Physical Exam: Vital signs in last 24 hours: Temp:  [97.8 F (36.6 C)-98.3 F (36.8 C)] 98.1 F (36.7 C) (11/14 0815) Pulse Rate:  [68-78] 74 (11/14 0815) Resp:  [13-32] 20 (11/14 0815) BP: (88-119)/(30-60) 97/48 (11/14 0815) SpO2:  [93 %-100 %] 93 % (11/14 0815) Weight:  [52.4 kg-54.4 kg] 52.4 kg (11/14 0614) Last BM Date : 06/14/24  General:  Pleasant male in NAD Psych:  Cooperative. Normal mood and affect Eyes: Pupils equal Ears:  Normal auditory acuity Nose: No deformity, discharge or lesions Neck:  Supple, no masses felt Lungs:  Clear to auscultation.  Heart:  Regular rate, regular rhythm.  Abdomen:  Soft, nondistended, nontender, active bowel sounds, no masses felt Rectal : Black soft stool in vault  Msk: Symmetrical without gross deformities.  Neurologic:  Alert, oriented, grossly normal neurologically Extremities : Left BKA    Intake/Output from previous day: 11/13 0701 - 11/14 0700 In: 100 [IV Piggyback:100] Out: -  Intake/Output this shift:  No intake/output data recorded.   Vina Dasen, NP-C   06/14/2024, 9:19 AM

## 2024-06-14 NOTE — Assessment & Plan Note (Signed)
 Patient with prior history of GI bleed secondary to gastric AVM, he was on Eliquis , no NSAID use. Hemoglobin with some improvement to 9.6 s/p 1 unit of PRBC.  GI is planning EGD tomorrow.  Colonoscopy was also discussed but patient declined.  - Continue with IV Protonix  -Clear liquid diet today -Keep holding Eliquis  -Monitor hemoglobin -Transfuse if below 8

## 2024-06-14 NOTE — Progress Notes (Signed)
 The patient completed dialysis treatment and goal met.  06/14/24 1706  Vitals  Temp 97.6 F (36.4 C)  Temp Source Oral  BP (!) 118/40  Pulse Rate 85  Resp 20  Weight 50.9 kg  Type of Weight Post-Dialysis  Oxygen Therapy  SpO2 100 %  O2 Device Room Air  Patient Activity (if Appropriate) In bed  Pulse Oximetry Type Continuous  Post Treatment  Dialyzer Clearance Clear  Liters Processed 84  Fluid Removed (mL) 2000 mL  Tolerated HD Treatment Yes  Post-Hemodialysis Comments goal met.  AVG/AVF Arterial Site Held (minutes) 10 minutes  AVG/AVF Venous Site Held (minutes) 10 minutes  Fistula / Graft Left Upper arm Arteriovenous fistula  Placement Date/Time: 12/11/19 1414   Placed prior to admission: No  Orientation: (c) Left  Access Location: Upper arm  Access Type: (c) Arteriovenous fistula  Site Condition No complications  Fistula / Graft Assessment Present;Thrill;Bruit  Status Deaccessed  Needle Size 15  Drainage Description None

## 2024-06-14 NOTE — Assessment & Plan Note (Signed)
-   Continue Flomax

## 2024-06-14 NOTE — Progress Notes (Addendum)
 Patient was seen for hypotension.   He has ESRD, HFmrEF, and PAF, is being followed by GI for blood-loss anemia, is on broad-spectrum antibiotics for gangrene with surrounding cellulitis, and has SBP in 70s and 80s despite albumin  and midodrine .   He is alert and afebrile with normal HR, warm extremities, and no complaints. Hgb was increasing on last check this evening.   Plan to give a small fluid bolus and check labs including H&H and lactate. Plan discussed at bedside with patient and RN.    Addendum (04:27): BP remains low despite midodrine , albumin , and IVF. Patient remains asymptomatic. Hgb dropped from 10.5 to 8.8, lactate reassuringly normal. Briefly discussed the situation with PCCM who recommended giving a unit of RBCs. Plan to transfuse 1 unit RBCs.

## 2024-06-14 NOTE — Assessment & Plan Note (Signed)
 Prior echo done on 08/20/2022 with EF of 40 to 45%.  Clinically appears euvolemic. - Volume is being managed with dialysis

## 2024-06-15 ENCOUNTER — Inpatient Hospital Stay (HOSPITAL_COMMUNITY): Admitting: Anesthesiology

## 2024-06-15 ENCOUNTER — Encounter (HOSPITAL_COMMUNITY)
Admission: EM | Disposition: A | Discharge status: Skilled Nursing Facility | Payer: Self-pay | Source: Skilled Nursing Facility | Attending: Family Medicine

## 2024-06-15 DIAGNOSIS — I129 Hypertensive chronic kidney disease with stage 1 through stage 4 chronic kidney disease, or unspecified chronic kidney disease: Secondary | ICD-10-CM

## 2024-06-15 DIAGNOSIS — E1122 Type 2 diabetes mellitus with diabetic chronic kidney disease: Secondary | ICD-10-CM | POA: Diagnosis not present

## 2024-06-15 DIAGNOSIS — N186 End stage renal disease: Secondary | ICD-10-CM | POA: Diagnosis not present

## 2024-06-15 DIAGNOSIS — I480 Paroxysmal atrial fibrillation: Secondary | ICD-10-CM | POA: Diagnosis not present

## 2024-06-15 DIAGNOSIS — T182XXA Foreign body in stomach, initial encounter: Secondary | ICD-10-CM

## 2024-06-15 DIAGNOSIS — I5022 Chronic systolic (congestive) heart failure: Secondary | ICD-10-CM | POA: Diagnosis not present

## 2024-06-15 DIAGNOSIS — K922 Gastrointestinal hemorrhage, unspecified: Secondary | ICD-10-CM | POA: Diagnosis not present

## 2024-06-15 HISTORY — PX: ESOPHAGOGASTRODUODENOSCOPY: SHX5428

## 2024-06-15 LAB — COMPREHENSIVE METABOLIC PANEL WITH GFR
ALT: 8 U/L (ref 0–44)
AST: 13 U/L — ABNORMAL LOW (ref 15–41)
Albumin: 2.3 g/dL — ABNORMAL LOW (ref 3.5–5.0)
Alkaline Phosphatase: 68 U/L (ref 38–126)
Anion gap: 14 (ref 5–15)
BUN: 9 mg/dL (ref 8–23)
CO2: 27 mmol/L (ref 22–32)
Calcium: 7.6 mg/dL — ABNORMAL LOW (ref 8.9–10.3)
Chloride: 99 mmol/L (ref 98–111)
Creatinine, Ser: 1.71 mg/dL — ABNORMAL HIGH (ref 0.61–1.24)
GFR, Estimated: 44 mL/min — ABNORMAL LOW (ref >60)
Glucose, Bld: 71 mg/dL (ref 70–99)
Potassium: 3.8 mmol/L (ref 3.5–5.1)
Sodium: 140 mmol/L (ref 135–145)
Total Bilirubin: 1.3 mg/dL — ABNORMAL HIGH (ref 0.0–1.2)
Total Protein: 5.7 g/dL — ABNORMAL LOW (ref 6.5–8.1)

## 2024-06-15 LAB — BLOOD CULTURE ID PANEL (REFLEXED) - BCID2

## 2024-06-15 LAB — CBC
HCT: 28 % — ABNORMAL LOW (ref 39.0–52.0)
HCT: 28.4 % — ABNORMAL LOW (ref 39.0–52.0)
HCT: 34.8 % — ABNORMAL LOW (ref 39.0–52.0)
Hemoglobin: 10.8 g/dL — ABNORMAL LOW (ref 13.0–17.0)
Hemoglobin: 8.8 g/dL — ABNORMAL LOW (ref 13.0–17.0)
Hemoglobin: 8.8 g/dL — ABNORMAL LOW (ref 13.0–17.0)
MCH: 29.8 pg (ref 26.0–34.0)
MCH: 30.7 pg (ref 26.0–34.0)
MCH: 30.8 pg (ref 26.0–34.0)
MCHC: 31 g/dL (ref 30.0–36.0)
MCHC: 31 g/dL (ref 30.0–36.0)
MCHC: 31.4 g/dL (ref 30.0–36.0)
MCV: 96.1 fL (ref 80.0–100.0)
MCV: 97.9 fL (ref 80.0–100.0)
MCV: 99 fL (ref 80.0–100.0)
Platelets: 211 K/uL (ref 150–400)
Platelets: 213 K/uL (ref 150–400)
Platelets: 218 K/uL (ref 150–400)
RBC: 2.86 MIL/uL — ABNORMAL LOW (ref 4.22–5.81)
RBC: 2.87 MIL/uL — ABNORMAL LOW (ref 4.22–5.81)
RBC: 3.62 MIL/uL — ABNORMAL LOW (ref 4.22–5.81)
RDW: 20.9 % — ABNORMAL HIGH (ref 11.5–15.5)
RDW: 21 % — ABNORMAL HIGH (ref 11.5–15.5)
RDW: 22.4 % — ABNORMAL HIGH (ref 11.5–15.5)
WBC: 11.6 K/uL — ABNORMAL HIGH (ref 4.0–10.5)
WBC: 11.9 K/uL — ABNORMAL HIGH (ref 4.0–10.5)
WBC: 12.4 K/uL — ABNORMAL HIGH (ref 4.0–10.5)
nRBC: 0.2 % (ref 0.0–0.2)
nRBC: 0.2 % (ref 0.0–0.2)
nRBC: 0.2 % (ref 0.0–0.2)

## 2024-06-15 LAB — GLUCOSE, CAPILLARY
Glucose-Capillary: 62 mg/dL — ABNORMAL LOW (ref 70–99)
Glucose-Capillary: 171 mg/dL — ABNORMAL HIGH (ref 70–99)

## 2024-06-15 LAB — HEPATITIS B SURFACE ANTIBODY, QUANTITATIVE: Hep B S AB Quant (Post): <3.5 m[IU]/mL — ABNORMAL LOW

## 2024-06-15 LAB — LACTIC ACID, PLASMA: Lactic Acid, Venous: 0.9 mmol/L (ref 0.5–1.9)

## 2024-06-15 LAB — PREPARE RBC (CROSSMATCH)

## 2024-06-15 SURGERY — EGD (ESOPHAGOGASTRODUODENOSCOPY)
Anesthesia: Monitor Anesthesia Care

## 2024-06-15 MED ORDER — DEXTROSE 50 % IV SOLN
INTRAVENOUS | Status: AC
  Administered 2024-06-15: 50 mL
  Filled 2024-06-15: qty 50

## 2024-06-15 MED ORDER — LACTATED RINGERS IV SOLN: INTRAVENOUS | Status: DC | PRN

## 2024-06-15 MED ORDER — SODIUM CHLORIDE 0.9% IV SOLUTION: Freq: Once | INTRAVENOUS | Status: AC

## 2024-06-15 MED ORDER — LACTATED RINGERS IV BOLUS
500.0000 mL | Freq: Once | INTRAVENOUS | Status: AC
  Administered 2024-06-15: 500 mL via INTRAVENOUS

## 2024-06-15 MED ORDER — PROPOFOL 500 MG/50ML IV EMUL
INTRAVENOUS | Status: DC | PRN
Start: 2024-06-15 — End: 2024-06-15
  Administered 2024-06-15: 180 ug/kg/min via INTRAVENOUS

## 2024-06-15 MED ORDER — MIDODRINE HCL 5 MG PO TABS
5.0000 mg | ORAL_TABLET | Freq: Three times a day (TID) | ORAL | Status: DC
  Administered 2024-06-15 – 2024-06-17 (×6): 5 mg via ORAL
  Filled 2024-06-15 (×6): qty 1

## 2024-06-15 MED ORDER — ALBUMIN HUMAN 25 % IV SOLN
12.5000 g | Freq: Once | INTRAVENOUS | Status: AC
  Administered 2024-06-15: 12.5 g via INTRAVENOUS
  Filled 2024-06-15: qty 50

## 2024-06-15 MED ORDER — SODIUM CHLORIDE 0.9 % IV SOLN: INTRAVENOUS | Status: DC

## 2024-06-15 MED ORDER — ENSURE PLUS HIGH PROTEIN PO LIQD
237.0000 mL | Freq: Two times a day (BID) | ORAL | Status: DC
  Administered 2024-06-16: 237 mL via ORAL

## 2024-06-15 NOTE — Plan of Care (Signed)
  Problem: Clinical Measurements: Goal: Ability to avoid or minimize complications of infection will improve Outcome: Progressing   Problem: Skin Integrity: Goal: Skin integrity will improve Outcome: Progressing   Problem: Education: Goal: Knowledge of General Education information will improve Description: Including pain rating scale, medication(s)/side effects and non-pharmacologic comfort measures Outcome: Progressing   Problem: Clinical Measurements: Goal: Will remain free from infection Outcome: Progressing   Problem: Nutrition: Goal: Adequate nutrition will be maintained Outcome: Progressing   Problem: Pain Managment: Goal: General experience of comfort will improve and/or be controlled Outcome: Progressing   Problem: Safety: Goal: Ability to remain free from injury will improve Outcome: Progressing   Problem: Skin Integrity: Goal: Risk for impaired skin integrity will decrease Outcome: Progressing

## 2024-06-15 NOTE — Op Note (Signed)
 [facility] Patient Name: [name] Procedure Date : 06/15/2024 MRN: [id number] Attending MD: Belvie Just , MD, [id number] Date of Birth: 09-03-1955 CSN: [id number] Age: 68 Admit Type: Inpatient Procedure:                Upper GI endoscopy Indications:              Iron  deficiency anemia Providers:                Belvie Just, MD, Collene Edu, RN, Haskel Chris,                            Technician Referring MD:              Medicines:                 Complications:            No immediate complications. Estimated Blood Loss:     Estimated blood loss: none. Procedure:                Pre-Anesthesia Assessment:                           - Prior to the procedure, a History and Physical                            was performed, and patient medications and                            allergies were reviewed. The patient's tolerance of                            previous anesthesia was also reviewed. The risks                            and benefits of the procedure and the sedation                            options and risks were discussed with the patient.                            All questions were answered, and informed consent                            was obtained. Prior Anticoagulants: The patient has                            taken Eliquis  (apixaban ), last dose was 1 day prior                            to procedure. ASA Grade Assessment: III - A patient                            with severe systemic disease. After reviewing the  risks and benefits, the patient was deemed in                            satisfactory condition to undergo the procedure.                           - Sedation was administered by an anesthesia                            professional. Deep sedation was attained.                           After obtaining informed consent, the endoscope was                            passed under direct vision. Throughout  the                            procedure, the patient's blood pressure, pulse, and                            oxygen saturations were monitored continuously. The                            [id number]-[id number] ([id number]) Olympus endoscope was introduced                            through the mouth, and advanced to the second part                            of duodenum. The upper GI endoscopy was                            accomplished without difficulty. The patient                            tolerated the procedure well. Scope In: Scope Out: Findings:      The esophagus was normal.      A large amount of food (residue) was found in the gastric fundus and in       the gastric body.      The examined duodenum was normal.      There was a large amount of retained food in the gastric lumen. No       evidence of any fresh or old blood to suggest any upper GI source. His       Eliquis  was only held for one day. A colonoscopy is recommended, but he       was emphatic twice about not having the procedure. Impression:               - Normal esophagus.                           - A large amount of food (residue) in the stomach.                           -  Normal examined duodenum.                           - No specimens collected. Recommendation:           - Return patient to hospital ward for ongoing care.                           - Resume regular diet.                           - Follow HGB and transfuse if necessary.                           - If he changes his mind about the colonoscopy,                            please call back.                           - Signing off. Procedure Code(s):        --- Professional ---                           [phone], Esophagogastroduodenoscopy, flexible,                            transoral; diagnostic, including collection of                            specimen(s) by brushing or washing, when performed                            (separate procedure) Diagnosis  Code(s):        --- Professional ---                           D50.9, Iron  deficiency anemia, unspecified CPT copyright 2022 American Medical Association. All rights reserved. The codes documented in this report are preliminary and upon coder review may  be revised to meet current compliance requirements. Belvie Just, MD Belvie Just, MD 06/15/2024 3:21:14 PM This report has been signed electronically. Number of Addenda: 0

## 2024-06-15 NOTE — Assessment & Plan Note (Signed)
 Patient with prior history of GI bleed secondary to gastric AVM, he was on Eliquis , no NSAID use. Hemoglobin at 8.8 s/p 2 unit of PRBC EGD today with no obvious bleeding Colonoscopy was also discussed but patient declined. - Monitor hemoglobin - Continue with IV Protonix  -Keep holding Eliquis  -Monitor hemoglobin -Transfuse if below 8

## 2024-06-15 NOTE — Progress Notes (Signed)
 Received pt  lying in bed comfortably, no signs of distress. Initial BP 82/31, HR 78; pt asymptomatic. Rechecked BP  on right leg and left thigh with similar low readings. Paged Dr. Charlton and updated regarding hypotension. Orders received for Midodrine  PO and Albumin  IV, administered as ordered. Repeat BP remained low. Dr. Charlton came to the bedside and assessed the pt. MD updated with BP trends. Order received for total of 1L NS bolus. After completion of 1L bolus, BP was 74/48. Additional order received for another Albumin  IV. Pt remains asymptomatic and resting comfortably. Monitoring continues. Safety precautions maintained.   06/14/24 2010  Vitals  Temp 98.8 F (37.1 C)  Temp Source Oral  BP (!) 82/31  MAP (mmHg) (!) 48  BP Location Right Arm  BP Method Automatic  Patient Position (if appropriate) Lying  Pulse Rate 78  ECG Heart Rate 79  Resp 20  MEWS COLOR  MEWS Score Color Green  Oxygen Therapy  SpO2 97 %  O2 Device Room Air  Pain Assessment  Pain Scale 0-10  Pain Score 0  MEWS Score  MEWS Temp 0  MEWS Systolic 1  MEWS Pulse 0  MEWS RR 0  MEWS LOC 0  MEWS Score 1

## 2024-06-15 NOTE — Anesthesia Preprocedure Evaluation (Signed)
 Anesthesia Evaluation  Patient identified by MRN, date of birth, ID band Patient awake    Reviewed: Allergy & Precautions, H&P , NPO status , Patient's Chart, lab work & pertinent test results  Airway Mallampati: II   Neck ROM: full    Dental   Pulmonary neg pulmonary ROS   breath sounds clear to auscultation       Cardiovascular hypertension, + Peripheral Vascular Disease and +CHF  + dysrhythmias Atrial Fibrillation  Rhythm:regular Rate:Normal     Neuro/Psych    GI/Hepatic   Endo/Other  diabetes, Type 2    Renal/GU ESRF and DialysisRenal disease     Musculoskeletal   Abdominal   Peds  Hematology  (+) Blood dyscrasia, anemia Hemoglobin 8.8   Anesthesia Other Findings   Reproductive/Obstetrics                              Anesthesia Physical Anesthesia Plan  ASA: 3  Anesthesia Plan: MAC   Post-op Pain Management:    Induction: Intravenous  PONV Risk Score and Plan: 1 and Propofol  infusion and Treatment may vary due to age or medical condition  Airway Management Planned: Nasal Cannula  Additional Equipment:   Intra-op Plan:   Post-operative Plan:   Informed Consent: I have reviewed the patients History and Physical, chart, labs and discussed the procedure including the risks, benefits and alternatives for the proposed anesthesia with the patient or authorized representative who has indicated his/her understanding and acceptance.     Dental advisory given  Plan Discussed with: CRNA, Anesthesiologist and Surgeon  Anesthesia Plan Comments:         Anesthesia Quick Evaluation

## 2024-06-15 NOTE — Plan of Care (Signed)

## 2024-06-15 NOTE — Progress Notes (Signed)
 Hypoglycemic Event  CBG: 62  Treatment: D50 50 mL (25 gm)  Symptoms: None  Follow-up CBG: Time:0352 CBG Result:171  Possible Reasons for Event: Unknown  Comments/MD notified:per protocol/ Dr. Opyd   [name] L

## 2024-06-15 NOTE — Progress Notes (Signed)
 Pt refused wound care after two attempts.

## 2024-06-15 NOTE — Interval H&P Note (Signed)
 History and Physical Interval Note:  06/15/2024 2:46 PM  [name]  has presented today for surgery, with the diagnosis of Hemoccult positive stool, melena (takes iron ).  The various methods of treatment have been discussed with the patient and family. After consideration of risks, benefits and other options for treatment, the patient has consented to  Procedure(s): EGD (ESOPHAGOGASTRODUODENOSCOPY) (N/A) as a surgical intervention.  The patient's history has been reviewed, patient examined, no change in status, stable for surgery.  I have reviewed the patient's chart and labs.  Questions were answered to the patient's satisfaction.     [name] D

## 2024-06-15 NOTE — Transfer of Care (Addendum)
 Immediate Anesthesia Transfer of Care Note  Patient: [name]  Procedure(s) Performed: EGD (ESOPHAGOGASTRODUODENOSCOPY)  Patient Location: PACU  Anesthesia Type:MAC  Level of Consciousness: sedated  Airway & Oxygen Therapy: Patient Spontanous Breathing  Post-op Assessment: Report given to RN and Post -op Vital signs reviewed and stable  Post vital signs: Reviewed and stable  Last Vitals:  Vitals Value Taken Time  BP 115/45 06/15/24 15:30  Temp 36.7 C 06/15/24 15:20  Pulse 86 06/15/24 16:02  Resp 33 06/15/24 15:38  SpO2 96 % 06/15/24 16:02  Vitals shown include unfiled device data.  Last Pain:  Vitals:   06/15/24 1534  TempSrc:   PainSc: 0-No pain         Complications: No notable events documented.

## 2024-06-15 NOTE — Assessment & Plan Note (Signed)
 Currently in sinus rhythm. -Continuing home amiodarone  -Holding metoprolol  due to softer blood pressure -Holding home Eliquis  due to GI bleed

## 2024-06-15 NOTE — Progress Notes (Signed)
 Progress Note   Patient: [name] FMW:[id number] DOB: Mar 25, 1956 DOA: 06/13/2024     2 DOS: the patient was seen and examined on 06/15/2024   Brief hospital course: Partly taken from H&P.  [name] is a 68 y.o. 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   On presentation stable vitals, large amount of melena noted in his diaper.  Labs with leukocytosis at 14.4, hemoglobin 8.3 from baseline of 9, positive FOBT.  Potassium 2.8 Started on IV PPI, GI was consulted. 1 unit PRBC ordered  11/14: Vital stable with borderline blood pressure of 97/48, small improvement in leukocytosis to 13.7, hemoglobin improved to 9.6 s/p 1 unit of PRBC, potassium 3.0.  GI is planning EGD tomorrow, keep holding Eliquis .  Patient declined colonoscopy.  HD today.  11/15: Overnight received another unit of PRBC due to becoming hypotensive.  EGD with normal esophagus, large amount of food in stomach, no evidence of any bleeding.  GI is recommending colonoscopy but patient currently refusing.  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 Hemoglobin at 8.8 s/p 2 unit of PRBC EGD today with no obvious bleeding Colonoscopy was also discussed but patient declined. - Monitor hemoglobin - Continue with IV Protonix  -Keep holding Eliquis  -Monitor hemoglobin -Transfuse if below 8  Paroxysmal atrial fibrillation (HCC) Currently in sinus rhythm. -Continuing home amiodarone  -Holding metoprolol  due to softer blood  pressure -Holding home Eliquis  due to GI bleed  Chronic HFrEF (heart failure with reduced ejection fraction) (HCC) Prior echo done on 08/20/2022 with EF of 40 to 45%.  Clinically appears euvolemic. - Volume is being managed with dialysis  Gangrene of penis Concern of superadded cellulitis. Recently diagnosed with gangrene of the tip of penis at urology office. - Started on broad-spectrum antibiotics with cefepime  and vancomycin  for concern of superadded cellulitis. - Continue with supportive care  Hypotension Blood pressure soft. - Ordered 1 more dose of midodrine  before dialysis today. - Holding metoprolol   ESRD on hemodialysis [facility]) Nephrology is on board and going for his scheduled dialysis today.  Hyperlipemia - Continue with home statin  Pressure injury Patient was found to have multiple unstageable pressure injuries, POA. Involving buttock, ischium and heel. -Wound care was consulted  BPH (benign prostatic hyperplasia) - Continue Flomax       Subjective: Patient was resting comfortably and awaiting for EGD when seen today.  He was not sure about the color of his stool today.  Physical Exam: Vitals:   06/15/24 1520 06/15/24 1525 06/15/24 1530 06/15/24 1534  BP: (!) 124/43  (!) 115/45   Pulse: 83  83 82  Resp: (!) 30 (!) 26 (!) 24 (!) 26  Temp: 98.1 F (36.7 C)     TempSrc: Temporal     SpO2: 91%  90% 92%  Weight:      Height:       General.  Ill-appearing, frail and malnourished gentleman, in no acute distress. Pulmonary.  Lungs clear bilaterally, normal respiratory effort. CV.  Regular rate and rhythm, no JVD, rub  or murmur. Abdomen.  Soft, nontender, nondistended, BS positive. CNS.  Alert and oriented .  No focal neurologic deficit. Extremities.  No edema, no cyanosis, pulses intact and symmetrical.   Data Reviewed: Prior data reviewed  Family Communication: Discussed with patient  Disposition: Status is: Inpatient Remains inpatient appropriate  because: Severity of illness  Planned Discharge Destination: Skilled nursing facility  DVT prophylaxis.  SCDs Time spent: 50 minutes  This record has been created using Conservation officer, historic buildings. Errors have been sought and corrected,but may not always be located. Such creation errors do not reflect on the standard of care.   Author: Amaryllis Dare, MD 06/15/2024 4:15 PM  For on call review [email or web address].

## 2024-06-15 NOTE — Progress Notes (Signed)
 PHARMACY - PHYSICIAN COMMUNICATION CRITICAL VALUE ALERT - BLOOD CULTURE IDENTIFICATION (BCID)  [name] is an 68 y.o. male who presented to [facility] on 06/13/2024 with a chief complaint of rectal bleeding and cellulitis  Assessment:  1/2 blood cultures growing Staphylococcus species, possible contaminant  Name of physician (or Provider) Contacted:  Dr. Charlton  Current antibiotics:  Vancomycin  and Cefepime    Changes to prescribed antibiotics recommended:  No changes needed at this time   Results for orders placed or performed during the hospital encounter of 08/24/23  Blood Culture ID Panel (Reflexed) (Collected: 08/24/2023  7:00 PM)  Result Value Ref Range   Enterococcus faecalis NOT DETECTED NOT DETECTED   Enterococcus Faecium NOT DETECTED NOT DETECTED   Listeria monocytogenes NOT DETECTED NOT DETECTED   Staphylococcus species DETECTED (A) NOT DETECTED   Staphylococcus aureus (BCID) NOT DETECTED NOT DETECTED   Staphylococcus epidermidis NOT DETECTED NOT DETECTED   Staphylococcus lugdunensis NOT DETECTED NOT DETECTED   Streptococcus species NOT DETECTED NOT DETECTED   Streptococcus agalactiae NOT DETECTED NOT DETECTED   Streptococcus pneumoniae NOT DETECTED NOT DETECTED   Streptococcus pyogenes NOT DETECTED NOT DETECTED   A.calcoaceticus-baumannii NOT DETECTED NOT DETECTED   Bacteroides fragilis NOT DETECTED NOT DETECTED   Enterobacterales NOT DETECTED NOT DETECTED   Enterobacter cloacae complex NOT DETECTED NOT DETECTED   Escherichia coli NOT DETECTED NOT DETECTED   Klebsiella aerogenes NOT DETECTED NOT DETECTED   Klebsiella oxytoca NOT DETECTED NOT DETECTED   Klebsiella pneumoniae NOT DETECTED NOT DETECTED   Proteus species NOT DETECTED NOT DETECTED   Salmonella species NOT DETECTED NOT DETECTED   Serratia marcescens NOT DETECTED NOT DETECTED   Haemophilus influenzae NOT DETECTED NOT DETECTED   Neisseria meningitidis NOT DETECTED NOT DETECTED   Pseudomonas  aeruginosa NOT DETECTED NOT DETECTED   Stenotrophomonas maltophilia NOT DETECTED NOT DETECTED   Candida albicans NOT DETECTED NOT DETECTED   Candida auris NOT DETECTED NOT DETECTED   Candida glabrata NOT DETECTED NOT DETECTED   Candida krusei NOT DETECTED NOT DETECTED   Candida parapsilosis NOT DETECTED NOT DETECTED   Candida tropicalis NOT DETECTED NOT DETECTED   Cryptococcus neoformans/gattii NOT DETECTED NOT DETECTED    [name] 06/15/2024  4:03 AM

## 2024-06-15 NOTE — Progress Notes (Signed)
 [facility] Progress Note   Subjective:   Reports he feels fine. Denies SOB, CP, dizziness, nausea.  Objective Vitals:   06/15/24 0758 06/15/24 0800 06/15/24 1115 06/15/24 1143  BP:  94/64 (!) 100/37 (!) 96/52  Pulse:  83 79 80  Resp:  20 (!) 23 (!) 25  Temp:  99.5 F (37.5 C)  98.6 F (37 C)  TempSrc: (P) Oral   Oral  SpO2:  97% 94% 97%  Weight:      Height:       Physical Exam General: alert male in NAD Heart: RRR, no murmurs Lungs: CTA bilaterally, respirations unlabored Abdomen: Soft, non-distended, +BS Extremities: No edema Dialysis Access: LUE AVF + t/b  Additional Objective Labs: Basic Metabolic Panel: Recent Labs  Lab 06/13/24 1605 06/14/24 0330 06/14/24 2359  NA 141 144 140  K 2.8* 3.0* 3.8  CL 94* 97* 99  CO2 31 28 27   GLUCOSE 130* 101* 71  BUN 13 17 9   CREATININE 2.60* 2.86* 1.71*  CALCIUM  7.7* 8.0* 7.6*  PHOS  --  3.5  --    Liver Function Tests: Recent Labs  Lab 06/13/24 1605 06/14/24 0330 06/14/24 2359  AST 17  --  13*  ALT 11  --  8  ALKPHOS 92  --  68  BILITOT 0.6  --  1.3*  PROT 6.1*  --  5.7*  ALBUMIN  2.0* 1.9* 2.3*   No results for input(s): LIPASE, AMYLASE in the last 168 hours. CBC: Recent Labs  Lab 06/14/24 0330 06/14/24 1216 06/14/24 1827 06/14/24 2359 06/15/24 0406  WBC 13.7* 13.1* 13.7* 11.9* 11.6*  NEUTROABS 12.1*  --   --   --   --   HGB 9.6* 9.7* 10.5* 8.8* 8.8*  HCT 30.9* 31.4* 33.4* 28.0* 28.4*  MCV 98.4 98.1 96.8 97.9 99.0  PLT 242 231 259 213 211   Blood Culture    Component Value Date/Time   SDES BLOOD BLOOD RIGHT HAND 06/13/2024 2257   SPECREQUEST  06/13/2024 2257    BOTTLES DRAWN AEROBIC AND ANAEROBIC Blood Culture results may not be optimal due to an inadequate volume of blood received in culture bottles   CULT  06/13/2024 2257    GRAM POSITIVE COCCI CULTURE REINCUBATED FOR BETTER GROWTH Performed at [facility], [address], [place], [place] [address]    REPTSTATUS  PENDING 06/13/2024 2257    Cardiac Enzymes: No results for input(s): CKTOTAL, CKMB, CKMBINDEX, TROPONINI in the last 168 hours. CBG: Recent Labs  Lab 06/15/24 0323 06/15/24 0352  GLUCAP 62* 171*   Iron  Studies: No results for input(s): IRON , TIBC, TRANSFERRIN, FERRITIN in the last 72 hours. @lablastinr3 @ Studies/Results: DG Chest Port 1 View Result Date: 06/13/2024 CLINICAL DATA:  GI bleed EXAM: PORTABLE CHEST 1 VIEW COMPARISON:  Chest x-ray 03/22/2024 FINDINGS: There is minimal left basilar atelectasis or airspace disease. There is no pleural effusion or pneumothorax. The heart is mildly enlarged. Vascular stent is noted in the left axilla. No acute fractures are identified. IMPRESSION: 1. Minimal left basilar atelectasis or airspace disease. 2. Mild cardiomegaly. Electronically Signed   By: Greig Pique M.D.   On: 06/13/2024 21:09   Medications:  ceFEPime  (MAXIPIME ) IV Stopped (06/15/24 0041)    amiodarone   200 mg Oral Daily   atorvastatin   20 mg Oral QHS   Chlorhexidine  Gluconate Cloth  6 each Topical Q0600   hydrocerin   Topical BID   liver oil-zinc oxide   Topical BID   midodrine   5 mg Oral TID WC   pantoprazole  (PROTONIX ) IV  40 mg Intravenous BID   tamsulosin   0.4 mg Oral Q M,W,F    Dialysis Orders: G-O MWF 3h B400  52.3kg  AVF  Heparin  none Last OP HD 11/12, post wt 51.3kg Mircera 225 mcg q 2wks, last 11/12, due 11/26 Old Hb's: 8.0 on 11/12, 9.0 on 11.06, 8.9 on 10/29  Assessment/Plan: GI bleed: per GI/ pmd. Hb 8-10 range.  ESRD: on HD MWF. Has not missed HD. Plan HD today.  BP: BP's have been low, received small fluid bolus and 1 unit PRBC with some improvement Volume: euvolemic on exam, was under dry wt by 1kg after last OP HD on Wed. Received PRBC and saline, still appears euvolemic Anemia of esrd: Hb 8,8 but receiving PRBC d/t hypotension, next esa due 11/26.   Lucie Collet, PA-C 06/15/2024, 12:26 PM  [facility] Pager: [phone]

## 2024-06-15 NOTE — Progress Notes (Signed)
 Pt is alert and oriented x 4. Attempted to turn pt and apply prescribed wound care cream. Pt refused turning and refused application of the cream, despite education on the importance of repositioning and wound care to prevent further skin breakdown and promote healing. Risks explained; pt verbalized understanding but continued to refuse. Pt positioned for comfort and safety. Will continue to offer wound care and repositioning as tolerated. MD will be updated as needed.

## 2024-06-16 ENCOUNTER — Encounter (HOSPITAL_COMMUNITY): Payer: Self-pay | Admitting: Gastroenterology

## 2024-06-16 DIAGNOSIS — K922 Gastrointestinal hemorrhage, unspecified: Secondary | ICD-10-CM | POA: Diagnosis not present

## 2024-06-16 LAB — CBC
HCT: 34.3 % — ABNORMAL LOW (ref 39.0–52.0)
Hemoglobin: 10.8 g/dL — ABNORMAL LOW (ref 13.0–17.0)
MCH: 30.3 pg (ref 26.0–34.0)
MCHC: 31.5 g/dL (ref 30.0–36.0)
MCV: 96.1 fL (ref 80.0–100.0)
Platelets: 205 K/uL (ref 150–400)
RBC: 3.57 MIL/uL — ABNORMAL LOW (ref 4.22–5.81)
RDW: 22 % — ABNORMAL HIGH (ref 11.5–15.5)
WBC: 12.7 K/uL — ABNORMAL HIGH (ref 4.0–10.5)
nRBC: 0 % (ref 0.0–0.2)

## 2024-06-16 LAB — TYPE AND SCREEN
ABO/RH(D): O NEG
Antibody Screen: NEGATIVE
Unit division: 0
Unit division: 0
Unit division: 0

## 2024-06-16 LAB — BPAM RBC
Blood Product Expiration Date: 202511192359
Blood Product Expiration Date: 202511242359
Blood Product Expiration Date: 202512052359
ISSUE DATE / TIME: 202511132252
ISSUE DATE / TIME: 202511150504
Unit Type and Rh: 9500
Unit Type and Rh: 9500
Unit Type and Rh: 9500

## 2024-06-16 LAB — CULTURE, BLOOD (ROUTINE X 2)

## 2024-06-16 MED ORDER — VANCOMYCIN HCL 500 MG/100ML IV SOLN
500.0000 mg | INTRAVENOUS | Status: DC
  Administered 2024-06-17: 500 mg via INTRAVENOUS
  Filled 2024-06-16: qty 100

## 2024-06-16 MED ORDER — CHLORHEXIDINE GLUCONATE CLOTH 2 % EX PADS: 6.0000 | MEDICATED_PAD | Freq: Every day | CUTANEOUS | Status: DC

## 2024-06-16 NOTE — Progress Notes (Signed)
 [facility] Progress Note   Subjective:   Sleeping but awakens to voice. Reports he is feeling well. Denies SOB, CP, dizziness.   Objective Vitals:   06/15/24 1957 06/16/24 0048 06/16/24 0454 06/16/24 0820  BP: (!) 98/52 (!) 93/40 (!) 93/48   Pulse: 77 76 69   Resp: (!) 21 20 20 18   Temp: 98.5 F (36.9 C) 98.6 F (37 C) 98.1 F (36.7 C) 98.6 F (37 C)  TempSrc: Oral Oral Oral Oral  SpO2: 96% 95% 98%   Weight:      Height:       Physical Exam  General: alert male in NAD Heart: RRR, no murmurs Lungs: CTA bilaterally, respirations unlabored Abdomen: Soft, non-distended, +BS Extremities: No edema Dialysis Access: LUE AVF + t/b  Additional Objective Labs: Basic Metabolic Panel: Recent Labs  Lab 06/13/24 1605 06/14/24 0330 06/14/24 2359  NA 141 144 140  K 2.8* 3.0* 3.8  CL 94* 97* 99  CO2 31 28 27   GLUCOSE 130* 101* 71  BUN 13 17 9   CREATININE 2.60* 2.86* 1.71*  CALCIUM  7.7* 8.0* 7.6*  PHOS  --  3.5  --    Liver Function Tests: Recent Labs  Lab 06/13/24 1605 06/14/24 0330 06/14/24 2359  AST 17  --  13*  ALT 11  --  8  ALKPHOS 92  --  68  BILITOT 0.6  --  1.3*  PROT 6.1*  --  5.7*  ALBUMIN  2.0* 1.9* 2.3*   No results for input(s): LIPASE, AMYLASE in the last 168 hours. CBC: Recent Labs  Lab 06/14/24 0330 06/14/24 1216 06/14/24 1827 06/14/24 2359 06/15/24 0406 06/15/24 1709 06/16/24 0513  WBC 13.7*   < > 13.7* 11.9* 11.6* 12.4* 12.7*  NEUTROABS 12.1*  --   --   --   --   --   --   HGB 9.6*   < > 10.5* 8.8* 8.8* 10.8* 10.8*  HCT 30.9*   < > 33.4* 28.0* 28.4* 34.8* 34.3*  MCV 98.4   < > 96.8 97.9 99.0 96.1 96.1  PLT 242   < > 259 213 211 218 205   < > = values in this interval not displayed.   Blood Culture    Component Value Date/Time   SDES BLOOD BLOOD RIGHT HAND 06/13/2024 2257   SPECREQUEST  06/13/2024 2257    BOTTLES DRAWN AEROBIC AND ANAEROBIC Blood Culture results may not be optimal due to an inadequate volume of  blood received in culture bottles   CULT  06/13/2024 2257    GRAM POSITIVE COCCI IDENTIFICATION TO FOLLOW Performed at [facility], [address], [place], [place] [address]    REPTSTATUS PENDING 06/13/2024 2257    Cardiac Enzymes: No results for input(s): CKTOTAL, CKMB, CKMBINDEX, TROPONINI in the last 168 hours. CBG: Recent Labs  Lab 06/15/24 0323 06/15/24 0352  GLUCAP 62* 171*   Iron  Studies: No results for input(s): IRON , TIBC, TRANSFERRIN, FERRITIN in the last 72 hours. @lablastinr3 @ Studies/Results: No results found. Medications:  ceFEPime  (MAXIPIME ) IV 1 g (06/15/24 2158)   [START ON 06/17/2024] vancomycin       amiodarone   200 mg Oral Daily   atorvastatin   20 mg Oral QHS   Chlorhexidine  Gluconate Cloth  6 each Topical Q0600   feeding supplement  237 mL Oral BID BM   hydrocerin   Topical BID   liver oil-zinc oxide   Topical BID   midodrine   5 mg Oral TID WC  pantoprazole  (PROTONIX ) IV  40 mg Intravenous BID   tamsulosin   0.4 mg Oral Q M,W,F    Dialysis Orders:  G-O MWF 3h B400  52.3kg  AVF  Heparin  none Last OP HD 11/12, post wt 51.3kg Mircera 225 mcg q 2wks, last 11/12, due 11/26 Old Hb's: 8.0 on 11/12, 9.0 on 11.06, 8.9 on 10/29  Assessment/Plan: GI bleed: per GI/ pmd. Hb currently at goal. S/p EGD ESRD: on HD MWF. Has not missed HD. Plan HD Monday BP: BP's have been low, received small fluid bolus and 1 unit PRBC with some improvement Volume: euvolemic on exam, was under dry wt by 1kg after last OP HD on Wed. Received PRBC and saline, still appears euvolemic Anemia of esrd: Hb 10.8, see above. next esa due 11/26.   Lucie Collet, PA-C 06/16/2024, 10:42 AM  [facility] Pager: [phone]

## 2024-06-16 NOTE — Progress Notes (Signed)
 Pt continuing to refuse tele monitoring, bed bath, and turning. Pt educated about the risks of refuses to turn or wear tele monitoring. Pt expressed understanding of risks and continues to refuse. MD Von notified.

## 2024-06-16 NOTE — Plan of Care (Signed)
  Problem: Clinical Measurements: Goal: Ability to avoid or minimize complications of infection will improve Outcome: Progressing   Problem: Education: Goal: Knowledge of General Education information will improve Description: Including pain rating scale, medication(s)/side effects and non-pharmacologic comfort measures Outcome: Progressing

## 2024-06-16 NOTE — Plan of Care (Signed)
  Problem: Clinical Measurements: Goal: Ability to avoid or minimize complications of infection will improve Outcome: Progressing   Problem: Skin Integrity: Goal: Skin integrity will improve Outcome: Progressing   Problem: Education: Goal: Knowledge of General Education information will improve Description: Including pain rating scale, medication(s)/side effects and non-pharmacologic comfort measures Outcome: Progressing   Problem: Health Behavior/Discharge Planning: Goal: Ability to manage health-related needs will improve Outcome: Progressing   Problem: Clinical Measurements: Goal: Ability to maintain clinical measurements within normal limits will improve Outcome: Progressing Goal: Will remain free from infection Outcome: Progressing Goal: Diagnostic test results will improve Outcome: Progressing Goal: Respiratory complications will improve Outcome: Progressing Goal: Cardiovascular complication will be avoided Outcome: Progressing   Problem: Activity: Goal: Risk for activity intolerance will decrease Outcome: Progressing   Problem: Nutrition: Goal: Adequate nutrition will be maintained Outcome: Progressing   Problem: Coping: Goal: Level of anxiety will decrease Outcome: Progressing   Problem: Elimination: Goal: Will not experience complications related to bowel motility Outcome: Progressing Goal: Will not experience complications related to urinary retention Outcome: Progressing   Problem: Pain Managment: Goal: General experience of comfort will improve and/or be controlled Outcome: Progressing   Problem: Safety: Goal: Ability to remain free from injury will improve Outcome: Progressing   Problem: Skin Integrity: Goal: Risk for impaired skin integrity will decrease Outcome: Progressing

## 2024-06-16 NOTE — Progress Notes (Signed)
 Progress Note   Patient: [name] FMW:[id number] DOB: [birth year] DOA: 06/13/2024     3 DOS: the patient was seen and examined on 06/16/2024   Brief hospital course: Partly taken from H&P.  Kwan Shellhammer is a 68 y.o. 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   On presentation stable vitals, large amount of melena noted in his diaper.  Labs with leukocytosis at 14.4, hemoglobin 8.3 from baseline of 9, positive FOBT.  Potassium 2.8 Started on IV PPI, GI was consulted. 1 unit PRBC ordered  11/14: Vital stable with borderline blood pressure of 97/48, small improvement in leukocytosis to 13.7, hemoglobin improved to 9.6 s/p 1 unit of PRBC, potassium 3.0.  GI is planning EGD tomorrow, keep holding Eliquis .  Patient declined colonoscopy.  HD today.  11/15: Overnight received another unit of PRBC due to becoming hypotensive.  EGD with normal esophagus, large amount of food in stomach, no evidence of any bleeding.  GI is recommending colonoscopy but patient currently refusing.  11/16 no more GI bleeding, H&H remained stable.  Patient is refusing colonoscopy.  Hemodialysis will be done tomorrow a.m., patient can be discharged to SNF tomorrow after hemodialysis if remains stable.  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 Hemoglobin at 8.8 s/p 2 unit of PRBC EGD today with no obvious bleeding Colonoscopy was also discussed but patient declined. - Monitor hemoglobin - Continue with IV Protonix  -Keep  holding Eliquis  -Monitor hemoglobin -Transfuse if below 8  Paroxysmal atrial fibrillation (HCC) Currently in sinus rhythm. -Continuing home amiodarone  -Holding metoprolol  due to softer blood pressure -Holding home Eliquis  due to GI bleed  Chronic HFrEF (heart failure with reduced ejection fraction) (HCC) Prior echo done on 08/20/2022 with EF of 40 to 45%.  Clinically appears euvolemic. - Volume is being managed with dialysis  Gangrene of penis Concern of superadded cellulitis. Recently diagnosed with gangrene of the tip of penis at urology office. - Started on broad-spectrum antibiotics with cefepime  and vancomycin  for concern of superadded cellulitis. - Continue with supportive care 11/16, blood culture growing GPC, follow ID and send of the report.     Hypotension Blood pressure soft. -11/16 continued midodrine  5 mg p.o. 3 times daily - Holding metoprolol  Monitor BP and titrate medication accordingly   ESRD on hemodialysis [facility]) Hemodialysis MWF schedule Nephrology following, next modality will be tomorrow.   Hyperlipemia - Continue with home statin  Pressure injury Patient was found to have multiple unstageable pressure injuries, POA. Involving buttock, ischium and heel. -Wound care was consulted  BPH (benign prostatic hyperplasia) - Continue Flomax      Active Pressure Injury/Wound(s)     None            Subjective: Patient was seen and examined at bedside in the morning rounds. Patient was resting comfortably in the bed, stated no more GI bleeding, denied any complaints.    Physical Exam: Vitals:   06/16/24 0820 06/16/24 1021 06/16/24 1137 06/16/24 1613  BP:  (!) 94/56    Pulse:  Resp: 18  20   Temp: 98.6 F (37 C)  98.4 F (36.9 C) 98.5 F (36.9 C)  TempSrc: Oral  Oral Oral  SpO2:      Weight:      Height:       General.  Ill-appearing, frail and malnourished gentleman, in no acute distress. Pulmonary.  Lungs clear bilaterally,  normal respiratory effort. CV.  Regular rate and rhythm, no JVD, rub or murmur. Abdomen.  Soft, nontender, nondistended, BS positive. CNS.  Alert and oriented .  No focal neurologic deficit. Extremities.  No edema, no cyanosis, pulses intact and symmetrical.   Data Reviewed: Prior data reviewed  Family Communication: Discussed with patient  Disposition: Status is: Inpatient Remains inpatient appropriate because: Severity of illness  Planned Discharge Destination: Skilled nursing facility  DVT prophylaxis.  SCDs Time spent: 40 minutes  This record has been created using Conservation officer, historic buildings. Errors have been sought and corrected,but may not always be located. Such creation errors do not reflect on the standard of care.   Author: Elvan Sor, MD 06/16/2024 4:17 PM  For on call review [email or web address].

## 2024-06-16 NOTE — Progress Notes (Signed)
 Patient refusing tele monitoring, refusing bed bath. Education attempted, patient still denying. MD Opyd notified.

## 2024-06-17 DIAGNOSIS — K922 Gastrointestinal hemorrhage, unspecified: Secondary | ICD-10-CM | POA: Diagnosis not present

## 2024-06-17 LAB — CBC
HCT: 36.3 % — ABNORMAL LOW (ref 39.0–52.0)
Hemoglobin: 11.3 g/dL — ABNORMAL LOW (ref 13.0–17.0)
MCH: 30.3 pg (ref 26.0–34.0)
MCHC: 31.1 g/dL (ref 30.0–36.0)
MCV: 97.3 fL (ref 80.0–100.0)
Platelets: 210 K/uL (ref 150–400)
RBC: 3.73 MIL/uL — ABNORMAL LOW (ref 4.22–5.81)
RDW: 21.1 % — ABNORMAL HIGH (ref 11.5–15.5)
WBC: 12.6 K/uL — ABNORMAL HIGH (ref 4.0–10.5)
nRBC: 0 % (ref 0.0–0.2)

## 2024-06-17 LAB — BASIC METABOLIC PANEL WITH GFR
Anion gap: 18 — ABNORMAL HIGH (ref 5–15)
BUN: 29 mg/dL — ABNORMAL HIGH (ref 8–23)
CO2: 23 mmol/L (ref 22–32)
Calcium: 8.4 mg/dL — ABNORMAL LOW (ref 8.9–10.3)
Chloride: 100 mmol/L (ref 98–111)
Creatinine, Ser: 3.73 mg/dL — ABNORMAL HIGH (ref 0.61–1.24)
GFR, Estimated: 17 mL/min — ABNORMAL LOW (ref >60)
Glucose, Bld: 83 mg/dL (ref 70–99)
Potassium: 4.6 mmol/L (ref 3.5–5.1)
Sodium: 141 mmol/L (ref 135–145)

## 2024-06-17 LAB — PHOSPHORUS: Phosphorus: 4 mg/dL (ref 2.5–4.6)

## 2024-06-17 LAB — MAGNESIUM: Magnesium: 1.8 mg/dL (ref 1.7–2.4)

## 2024-06-17 MED ORDER — VANCOMYCIN HCL 500 MG/100ML IV SOLN
INTRAVENOUS | Status: AC
  Filled 2024-06-17: qty 100

## 2024-06-17 NOTE — Progress Notes (Signed)
 [facility] Progress Note   Subjective:   Seen in room. No new events overnight. No complaints. For dialysis today.   Objective Vitals:   06/16/24 2340 06/16/24 2343 06/17/24 0524 06/17/24 1007  BP:  (!) 106/52 104/83 (!) 102/47  Pulse:   82 83  Resp:  18    Temp: 98.5 F (36.9 C) 98.5 F (36.9 C) 98.3 F (36.8 C)   TempSrc:  Oral Oral   SpO2:  100%  98%  Weight:      Height:       Physical Exam General: alert male in NAD Heart: RRR, no murmurs Lungs: CTA bilaterally, respirations unlabored Abdomen: Soft, non-distended, +BS Extremities: No edema Dialysis Access: LUE AVF + t/b  Additional Objective Labs: Basic Metabolic Panel: Recent Labs  Lab 06/14/24 0330 06/14/24 2359 06/17/24 0546  NA 144 140 141  K 3.0* 3.8 4.6  CL 97* 99 100  CO2 28 27 23   GLUCOSE 101* 71 83  BUN 17 9 29*  CREATININE 2.86* 1.71* 3.73*  CALCIUM  8.0* 7.6* 8.4*  PHOS 3.5  --  4.0   Liver Function Tests: Recent Labs  Lab 06/13/24 1605 06/14/24 0330 06/14/24 2359  AST 17  --  13*  ALT 11  --  8  ALKPHOS 92  --  68  BILITOT 0.6  --  1.3*  PROT 6.1*  --  5.7*  ALBUMIN  2.0* 1.9* 2.3*   No results for input(s): LIPASE, AMYLASE in the last 168 hours. CBC: Recent Labs  Lab 06/14/24 0330 06/14/24 1216 06/14/24 2359 06/15/24 0406 06/15/24 1709 06/16/24 0513 06/17/24 0546  WBC 13.7*   < > 11.9* 11.6* 12.4* 12.7* 12.6*  NEUTROABS 12.1*  --   --   --   --   --   --   HGB 9.6*   < > 8.8* 8.8* 10.8* 10.8* 11.3*  HCT 30.9*   < > 28.0* 28.4* 34.8* 34.3* 36.3*  MCV 98.4   < > 97.9 99.0 96.1 96.1 97.3  PLT 242   < > 213 211 218 205 210   < > = values in this interval not displayed.   Blood Culture    Component Value Date/Time   SDES BLOOD BLOOD RIGHT HAND 06/13/2024 2257   SPECREQUEST  06/13/2024 2257    BOTTLES DRAWN AEROBIC AND ANAEROBIC Blood Culture results may not be optimal due to an inadequate volume of blood received in culture bottles   CULT (A)  06/13/2024 2257    STAPHYLOCOCCUS HOMINIS THE SIGNIFICANCE OF ISOLATING THIS ORGANISM FROM A SINGLE SET OF BLOOD CULTURES WHEN MULTIPLE SETS ARE DRAWN IS UNCERTAIN. PLEASE NOTIFY THE MICROBIOLOGY DEPARTMENT WITHIN ONE WEEK IF SPECIATION AND SENSITIVITIES ARE REQUIRED. Performed at [facility], [address] [place], [place] [address]    REPTSTATUS 06/16/2024 FINAL 06/13/2024 2257    Cardiac Enzymes: No results for input(s): CKTOTAL, CKMB, CKMBINDEX, TROPONINI in the last 168 hours. CBG: Recent Labs  Lab 06/15/24 0323 06/15/24 0352  GLUCAP 62* 171*   Iron  Studies: No results for input(s): IRON , TIBC, TRANSFERRIN, FERRITIN in the last 72 hours. @lablastinr3 @ Studies/Results: No results found. Medications:  ceFEPime  (MAXIPIME ) IV 1 g (06/16/24 2230)   vancomycin       amiodarone   200 mg Oral Daily   atorvastatin   20 mg Oral QHS   Chlorhexidine  Gluconate Cloth  6 each Topical Q0600   Chlorhexidine  Gluconate Cloth  6 each Topical Q0600   feeding supplement  237 mL Oral BID  BM   hydrocerin   Topical BID   liver oil-zinc oxide   Topical BID   midodrine   5 mg Oral TID WC   pantoprazole  (PROTONIX ) IV  40 mg Intravenous BID   tamsulosin   0.4 mg Oral Q M,W,F    Dialysis Orders:  [facility] MWF 3h B400  52.3kg  AVF  Heparin  none Last OP HD 11/12, post wt 51.3kg Mircera 225 mcg q 2wks, last 11/12, due 11/26 Old Hb's: 8.0 on 11/12, 9.0 on 11.06, 8.9 on 10/29  Assessment/Plan: GIB/Melena. H/o AVMs. Received 2 u prbcs. S/p EGD w/o acute findings. Elqiuis on hold.  ESRD. on HD MWF. Continue per usual schedule. HD today.  HTN.  BP have been low, received small fluid bolus and 1 unit PRBC with some improvement Volume.  euvolemic on exam, was under dry wt by 1kg after last OP HD on Wed. Received PRBC and saline, still appears euvolemic Anemia. Hb 11 s/p prbcs. No ESA indication at present.  HFrEF. Optimze olume with HD  Maisie Fellows Jameela Michna  PA-C 06/17/2024,10:40 AM

## 2024-06-17 NOTE — Anesthesia Postprocedure Evaluation (Signed)
 Anesthesia Post Note  Patient: [name]  Procedure(s) Performed: EGD (ESOPHAGOGASTRODUODENOSCOPY)     Patient location during evaluation: PACU Anesthesia Type: MAC Level of consciousness: awake and alert Pain management: pain level controlled Vital Signs Assessment: post-procedure vital signs reviewed and stable Respiratory status: spontaneous breathing, nonlabored ventilation, respiratory function stable and patient connected to nasal cannula oxygen Cardiovascular status: stable and blood pressure returned to baseline Postop Assessment: no apparent nausea or vomiting Anesthetic complications: no   No notable events documented.  Last Vitals:  Vitals:   06/16/24 2343 06/17/24 0524  BP: (!) 106/52 104/83  Pulse:  82  Resp: 18   Temp: 36.9 C 36.8 C  SpO2: 100%     Last Pain:  Vitals:   06/17/24 0524  TempSrc: Oral  PainSc:                  [name] S

## 2024-06-17 NOTE — Care Management Important Message (Signed)
 Important Message  Patient Details  Name: [name] MRN: [id number] Date of Birth: 09/18/1955   Important Message Given:  Yes - Medicare IM     [name] 06/17/2024, 11:22 AM

## 2024-06-17 NOTE — Plan of Care (Signed)
  Problem: Clinical Measurements: Goal: Ability to avoid or minimize complications of infection will improve Outcome: Progressing   Problem: Skin Integrity: Goal: Skin integrity will improve Outcome: Progressing   Problem: Education: Goal: Knowledge of General Education information will improve Description: Including pain rating scale, medication(s)/side effects and non-pharmacologic comfort measures Outcome: Progressing   Problem: Health Behavior/Discharge Planning: Goal: Ability to manage health-related needs will improve Outcome: Progressing   Problem: Clinical Measurements: Goal: Ability to maintain clinical measurements within normal limits will improve Outcome: Progressing Goal: Will remain free from infection Outcome: Progressing Goal: Diagnostic test results will improve Outcome: Progressing Goal: Respiratory complications will improve Outcome: Progressing Goal: Cardiovascular complication will be avoided Outcome: Progressing   Problem: Activity: Goal: Risk for activity intolerance will decrease Outcome: Progressing   Problem: Nutrition: Goal: Adequate nutrition will be maintained Outcome: Progressing   Problem: Coping: Goal: Level of anxiety will decrease Outcome: Progressing   Problem: Elimination: Goal: Will not experience complications related to bowel motility Outcome: Progressing Goal: Will not experience complications related to urinary retention Outcome: Progressing   Problem: Pain Managment: Goal: General experience of comfort will improve and/or be controlled Outcome: Progressing   Problem: Safety: Goal: Ability to remain free from injury will improve Outcome: Progressing   Problem: Skin Integrity: Goal: Risk for impaired skin integrity will decrease Outcome: Progressing

## 2024-06-17 NOTE — Progress Notes (Signed)
 CSW received a call back from Caro in admissions at [facility]. CSW was told they can accept patient back today after dialysis. CSW did inform Asberry that it might not be until late afternoon before patient gets done with dialysis. Asberry asked for the discharge summary to be faxed to her once completed.

## 2024-06-17 NOTE — TOC Progression Note (Signed)
 Transition of Care [facility]) - Progression Note    Patient Details  Name: [name] MRN: [id number] Date of Birth: December 30, 1955  [facility] CM/SW Contact  Clotilda Brenner, [place] Phone Number: 06/17/2024, 10:47 AM  Clinical Narrative:  CSW contacted Asberry in admissions at [facility] to inform them of patient's possible discharge today. CSW left a message requesting a call back.                       Expected Discharge Plan and Services                                               Social Drivers of Health (SDOH) Interventions SDOH Screenings   Food Insecurity: Patient Declined (06/15/2024)  Housing: Unknown (06/15/2024)  Transportation Needs: Patient Declined (06/15/2024)  Utilities: Patient Declined (06/15/2024)  Social Connections: Patient Declined (06/15/2024)  Tobacco Use: Low Risk  (06/13/2024)    Readmission Risk Interventions     No data to display

## 2024-06-17 NOTE — Progress Notes (Signed)
 Patient refuses examination of scrotum and refuses to turn for examination of sacrum.  He states there is nothing there.  The patient also refuses to turn and was educated on the importance of turning as he already has pressure wounds.  He refuses offloading boots.  I was able to get a brief look at the pressure injury on the right foot before he asked me to stop.  The patient takes his medications, but refuses all other care.

## 2024-06-17 NOTE — Progress Notes (Signed)
 PROGRESS NOTE    [name]  FMW:[id number] DOB: 1956-06-04 DOA: 06/13/2024 PCP: Patient, No Pcp Per    Brief Narrative:  This 68 y.o. 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  His vitals were stable on arrival but noted to have large amount of melena in his diaper.  Hemoglobin 8.3 from baseline of 9 positive FOBT.  Patient was admitted and started on IV PPIs, GI was consulted,  Patient was given 1 unit of PRBC. Patient underwent EGD which was unremarkable.  Patient declined to have colonoscopy. H&H remained stable.  Patient has been refusing colonoscopy.  Patient is getting hemodialysis.  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 Hemoglobin at 8.8 s/p 2 unit of PRBC EGD this admission with no obvious bleeding. Colonoscopy was also discussed but patient declined. H/H has been improving.  Hb 10.8 > 11.3 Continue with IV Protonix  Keep holding Eliquis .will discuss with GI about resumption.   Paroxysmal atrial fibrillation (HCC) Currently in sinus rhythm. HR controlled. Continuing home amiodarone . Holding metoprolol  due to softer blood  pressure Holding home Eliquis  due to GI bleed.   Chronic HFrEF (heart failure with reduced ejection fraction) (HCC) Prior echo done on 08/20/2022 with EF of 40 to 45%.  Clinically appears euvolemic. Volume is being managed with dialysis   Gangrene of penis: Concern of superadded cellulitis: Recently diagnosed with gangrene of the tip of penis at urology office. Started on broad-spectrum antibiotics with cefepime  and vancomycin  for concern of superadded cellulitis. Continue with supportive care 11/16, blood culture growing GPC, staphylococcus hominis. Discussed with ID whether to continue with antibiotics     Hypotension: Patient was soft initially. He was continued midodrine  5 mg p.o. 3 times daily Holding metoprolol  Now Blood pressure is up.  Will discontinue midodrine .     ESRD on hemodialysis [facility]): Hemodialysis MWF schedule Nephrology following, next modality will be today.     Hyperlipemia: Continue with home statin.   Pressure injury: Patient was found to have multiple unstageable pressure injuries, POA. Involving buttock, ischium and heel. -Wound care was consulted.   BPH (benign prostatic hyperplasia) Continue Flomax .   DVT prophylaxis: SCDs Code Status:Full code Family Communication: No family at bed side Disposition Plan:    Status is: Inpatient Remains inpatient appropriate because: Anticipated discharge back to SNF tomorrow.   Consultants:  Nephrology Gastroenterology  Procedures: EGD  Antimicrobials:  Anti-infectives (From admission, onward)    Start     Dose/Rate Route Frequency Ordered Stop   06/17/24 2200  vancomycin  (VANCOREADY) IVPB 500 mg/100 mL        500 mg 100 mL/hr over 60 Minutes Intravenous  Every M-W-F (Hemodialysis) 06/16/24 0812     06/14/24 1800  vancomycin  (VANCOREADY) IVPB 500 mg/100 mL        500 mg 100 mL/hr over 60 Minutes Intravenous  Once 06/14/24 1311 06/14/24 1916   06/13/24 2300  vancomycin  (VANCOREADY) IVPB 1250  mg/250 mL        1,250 mg 166.7 mL/hr over 90 Minutes Intravenous  Once 06/13/24 2250 06/14/24 0557   06/13/24 2300  ceFEPIme  (MAXIPIME ) 1 g in sodium chloride  0.9 % 100 mL IVPB        1 g 200 mL/hr over 30 Minutes Intravenous Every 24 hours 06/13/24 2250        Subjective: Patient was seen and examined at bedside.  Overnight events noted. Patient reports feeling very weak , states he is going to have dialysis today. Denies any further bleeding,  He refuses to have colonoscopy.  Objective: Vitals:   06/17/24 1352 06/17/24 1400 06/17/24 1430 06/17/24 1500  BP: (!) 143/73 (!) 149/74 (!) 140/88 (!) 148/59  Pulse: 77 76 77 82  Resp: (!) 24 (!) 26 14 15   Temp:      TempSrc:      SpO2: 92% 94% 95% 96%  Weight:      Height:       No intake or output data in the 24 hours ending 06/17/24 1528 Filed Weights   06/14/24 0614 06/14/24 1307 06/14/24 1706  Weight: 52.4 kg 52.7 kg 50.9 kg    Examination:  General exam: Appears calm and comfortable, deconditioned, not in any acute distress. Respiratory system: Clear to auscultation. Respiratory effort normal.  RR 12 Cardiovascular system: S1 & S2 heard, RRR. No JVD, murmurs, rubs, gallops or clicks.  Gastrointestinal system: Abdomen is non distended, soft and non tender. Normal bowel sounds heard. Central nervous system: Alert and oriented X 3. No focal neurological deficits. Extremities: No Edema, no cyanosis, no clubbing. Skin: No rashes, lesions or ulcers Psychiatry: Judgement and insight appear normal. Mood & affect appropriate.     Data Reviewed: I have personally reviewed following labs and imaging studies  CBC: Recent Labs  Lab 06/14/24 0330 06/14/24 1216 06/14/24 2359 06/15/24 0406 06/15/24 1709 06/16/24 0513 06/17/24 0546  WBC 13.7*   < > 11.9* 11.6* 12.4* 12.7* 12.6*  NEUTROABS 12.1*  --   --   --   --   --   --   HGB 9.6*   < > 8.8* 8.8* 10.8* 10.8* 11.3*  HCT 30.9*   < > 28.0* 28.4* 34.8* 34.3* 36.3*  MCV 98.4    < > 97.9 99.0 96.1 96.1 97.3  PLT 242   < > 213 211 218 205 210   < > = values in this interval not displayed.   Basic Metabolic Panel: Recent Labs  Lab 06/13/24 1605 06/14/24 0330 06/14/24 2359 06/17/24 0546  NA 141 144 140 141  K 2.8* 3.0* 3.8 4.6  CL 94* 97* 99 100  CO2 31 28 27 23   GLUCOSE 130* 101* 71 83  BUN 13 17 9  29*  CREATININE 2.60* 2.86* 1.71* 3.73*  CALCIUM  7.7* 8.0* 7.6* 8.4*  MG  --   --   --  1.8  PHOS  --  3.5  --  4.0   GFR: Estimated Creatinine Clearance: 14 mL/min (A) (by C-G formula based on SCr of 3.73 mg/dL (H)). Liver Function Tests: Recent Labs  Lab 06/13/24 1605 06/14/24 0330 06/14/24 2359  AST 17  --  13*  ALT 11  --  8  ALKPHOS 92  --  68  BILITOT 0.6  --  1.3*  PROT 6.1*  --  5.7*  ALBUMIN  2.0* 1.9* 2.3*   No results for input(s): LIPASE, AMYLASE in the last 168 hours. No results for input(s): AMMONIA in the last 168 hours. Coagulation Profile: No results for input(s): INR, PROTIME in the last 168 hours. Cardiac Enzymes: No results for input(s): CKTOTAL, CKMB, CKMBINDEX, TROPONINI in the last 168 hours. BNP (last 3 results) No results for input(s): PROBNP in the last 8760 hours. HbA1C: No results for input(s): HGBA1C in the last 72 hours. CBG: Recent Labs  Lab 06/15/24 0323 06/15/24 0352  GLUCAP 62* 171*   Lipid Profile: No results for input(s): CHOL, HDL, LDLCALC, TRIG, CHOLHDL, LDLDIRECT in the last 72 hours. Thyroid Function Tests: No results for input(s): TSH, T4TOTAL, FREET4, T3FREE, THYROIDAB in the last 72 hours. Anemia Panel: No results for input(s): VITAMINB12, FOLATE, FERRITIN, TIBC, IRON , RETICCTPCT in the last 72 hours. Sepsis Labs: Recent Labs  Lab 06/14/24 2359  LATICACIDVEN 0.9    Recent Results (from the past 240 hours)  Culture, blood (Routine X 2) w Reflex to ID Panel     Status: None (Preliminary result)   Collection Time: 06/13/24 10:52 PM    Specimen: BLOOD  Result Value Ref Range Status   Specimen Description BLOOD RIGHT ANTECUBITAL  Final   Special Requests   Final    BOTTLES DRAWN AEROBIC AND ANAEROBIC Blood Culture adequate volume   Culture   Final    NO GROWTH 3 DAYS Performed at [facility], [address], [place], [place] [address]    Report Status PENDING  Incomplete  Culture, blood (Routine X 2) w Reflex to ID Panel     Status: Abnormal   Collection Time: 06/13/24 10:57 PM   Specimen: BLOOD  Result Value Ref Range Status   Specimen Description BLOOD BLOOD RIGHT HAND  Final   Special Requests   Final    BOTTLES DRAWN AEROBIC AND ANAEROBIC Blood Culture results may not be optimal due to an inadequate volume of blood received in culture bottles   Culture  Setup Time   Final    GRAM POSITIVE COCCI AEROBIC BOTTLE ONLY CRITICAL RESULT CALLED TO, READ BACK BY AND VERIFIED WITH: PHARMD GREG ABBOTT [phone] [id number] BY J RAZZAK, MT    Culture (A)  Final    STAPHYLOCOCCUS HOMINIS THE SIGNIFICANCE OF ISOLATING THIS ORGANISM FROM A SINGLE SET OF BLOOD CULTURES WHEN MULTIPLE SETS ARE DRAWN IS UNCERTAIN. PLEASE NOTIFY THE MICROBIOLOGY DEPARTMENT WITHIN ONE WEEK IF SPECIATION AND SENSITIVITIES ARE REQUIRED. Performed at [facility], [address] [place], [place] [address]    Report Status 06/16/2024 FINAL  Final  Blood Culture ID Panel (Reflexed)     Status: Abnormal   Collection Time: 06/13/24 10:57 PM  Result Value Ref Range Status   Enterococcus faecalis NOT DETECTED NOT DETECTED Final   Enterococcus Faecium NOT DETECTED NOT DETECTED Final   Listeria monocytogenes NOT DETECTED NOT DETECTED Final   Staphylococcus species DETECTED (A) NOT DETECTED Final    Comment: CRITICAL RESULT CALLED TO, READ BACK BY AND VERIFIED WITH: PHARMD GREG ABBOTT [phone] [id number] BY J RAZZAK, MT    Staphylococcus aureus (BCID) NOT DETECTED NOT DETECTED Final   Staphylococcus epidermidis NOT DETECTED NOT DETECTED Final    Staphylococcus lugdunensis NOT DETECTED NOT DETECTED Final   Streptococcus species NOT DETECTED NOT DETECTED Final   Streptococcus agalactiae NOT DETECTED  NOT DETECTED Final   Streptococcus pneumoniae NOT DETECTED NOT DETECTED Final   Streptococcus pyogenes NOT DETECTED NOT DETECTED Final   A.calcoaceticus-baumannii NOT DETECTED NOT DETECTED Final   Bacteroides fragilis NOT DETECTED NOT DETECTED Final   Enterobacterales NOT DETECTED NOT DETECTED Final   Enterobacter cloacae complex NOT DETECTED NOT DETECTED Final   Escherichia coli NOT DETECTED NOT DETECTED Final   Klebsiella aerogenes NOT DETECTED NOT DETECTED Final   Klebsiella oxytoca NOT DETECTED NOT DETECTED Final   Klebsiella pneumoniae NOT DETECTED NOT DETECTED Final   Proteus species NOT DETECTED NOT DETECTED Final   Salmonella species NOT DETECTED NOT DETECTED Final   Serratia marcescens NOT DETECTED NOT DETECTED Final   Haemophilus influenzae NOT DETECTED NOT DETECTED Final   Neisseria meningitidis NOT DETECTED NOT DETECTED Final   Pseudomonas aeruginosa NOT DETECTED NOT DETECTED Final   Stenotrophomonas maltophilia NOT DETECTED NOT DETECTED Final   Candida albicans NOT DETECTED NOT DETECTED Final   Candida auris NOT DETECTED NOT DETECTED Final   Candida glabrata NOT DETECTED NOT DETECTED Final   Candida krusei NOT DETECTED NOT DETECTED Final   Candida parapsilosis NOT DETECTED NOT DETECTED Final   Candida tropicalis NOT DETECTED NOT DETECTED Final   Cryptococcus neoformans/gattii NOT DETECTED NOT DETECTED Final    Comment: Performed at [facility], [address], [place], [place] [address]  MRSA Next Gen by PCR, Nasal     Status: None   Collection Time: 06/14/24  6:26 AM   Specimen: Nasal Mucosa; Nasal Swab  Result Value Ref Range Status   MRSA by PCR Next Gen NOT DETECTED NOT DETECTED Final    Comment: (NOTE) The GeneXpert MRSA Assay (FDA approved for NASAL specimens only), is one component of a comprehensive  MRSA colonization surveillance program. It is not intended to diagnose MRSA infection nor to guide or monitor treatment for MRSA infections. Test performance is not FDA approved in patients less than 62 years old. Performed at [facility], [address] [place], [place] [address]     Radiology Studies: No results found.  Scheduled Meds:  amiodarone   200 mg Oral Daily   atorvastatin   20 mg Oral QHS   Chlorhexidine  Gluconate Cloth  6 each Topical Q0600   Chlorhexidine  Gluconate Cloth  6 each Topical Q0600   feeding supplement  237 mL Oral BID BM   hydrocerin   Topical BID   liver oil-zinc oxide   Topical BID   midodrine   5 mg Oral TID WC   pantoprazole  (PROTONIX ) IV  40 mg Intravenous BID   tamsulosin   0.4 mg Oral Q M,W,F   Continuous Infusions:  ceFEPime  (MAXIPIME ) IV 1 g (06/16/24 2230)   vancomycin        LOS: 4 days    Time spent: 50 mins    Darcel Dawley, MD [facility]   If 7PM-7AM, please contact night-coverage

## 2024-06-17 NOTE — TOC Progression Note (Signed)
 Transition of Care [facility]) - Progression Note    Patient Details  Name: [name] MRN: [id number] Date of Birth: 08-06-1955  [facility]) CM/SW Contact  Lauraine FORBES Saa, LCSWA Phone Number: 06/17/2024, 2:14 PM  Clinical Narrative:     2:14 PM Per MD, patient will no longer discharge today after HD due to reporting weakness. Bedside RN and [facility] made aware. CSW will continue to follow.  Expected Discharge Plan: Long Term Nursing Home Barriers to Discharge: Continued Medical Work up               Expected Discharge Plan and Services In-house Referral: Clinical Social Work   Post Acute Care Choice: Skilled Nursing Facility Living arrangements for the past 2 months: Skilled Nursing Facility                                       Social Drivers of Health (SDOH) Interventions SDOH Screenings   Food Insecurity: Patient Declined (06/15/2024)  Housing: Unknown (06/15/2024)  Transportation Needs: Patient Declined (06/15/2024)  Utilities: Patient Declined (06/15/2024)  Social Connections: Patient Declined (06/15/2024)  Tobacco Use: Low Risk  (06/13/2024)    Readmission Risk Interventions     No data to display

## 2024-06-17 NOTE — Plan of Care (Signed)
  Problem: Skin Integrity: Goal: Skin integrity will improve Outcome: Not Progressing

## 2024-06-18 DIAGNOSIS — K922 Gastrointestinal hemorrhage, unspecified: Secondary | ICD-10-CM | POA: Diagnosis not present

## 2024-06-18 LAB — CBC WITH DIFFERENTIAL/PLATELET
Abs Immature Granulocytes: 0.15 K/uL — ABNORMAL HIGH (ref 0.00–0.07)
Basophils Absolute: 0.1 K/uL (ref 0.0–0.1)
Basophils Relative: 0 %
Eosinophils Absolute: 0.1 K/uL (ref 0.0–0.5)
Eosinophils Relative: 1 %
HCT: 36 % — ABNORMAL LOW (ref 39.0–52.0)
Hemoglobin: 11.1 g/dL — ABNORMAL LOW (ref 13.0–17.0)
Immature Granulocytes: 1 %
Lymphocytes Relative: 5 %
Lymphs Abs: 0.6 K/uL — ABNORMAL LOW (ref 0.7–4.0)
MCH: 30.3 pg (ref 26.0–34.0)
MCHC: 30.8 g/dL (ref 30.0–36.0)
MCV: 98.4 fL (ref 80.0–100.0)
Monocytes Absolute: 0.6 K/uL (ref 0.1–1.0)
Monocytes Relative: 5 %
Neutro Abs: 10.3 K/uL — ABNORMAL HIGH (ref 1.7–7.7)
Neutrophils Relative %: 88 %
Platelets: 178 K/uL (ref 150–400)
RBC: 3.66 MIL/uL — ABNORMAL LOW (ref 4.22–5.81)
RDW: 20.4 % — ABNORMAL HIGH (ref 11.5–15.5)
WBC: 11.7 K/uL — ABNORMAL HIGH (ref 4.0–10.5)
nRBC: 0.2 % (ref 0.0–0.2)

## 2024-06-18 LAB — BASIC METABOLIC PANEL WITH GFR
Anion gap: 16 — ABNORMAL HIGH (ref 5–15)
BUN: 14 mg/dL (ref 8–23)
CO2: 22 mmol/L (ref 22–32)
Calcium: 7.9 mg/dL — ABNORMAL LOW (ref 8.9–10.3)
Chloride: 99 mmol/L (ref 98–111)
Creatinine, Ser: 2.32 mg/dL — ABNORMAL HIGH (ref 0.61–1.24)
GFR, Estimated: 30 mL/min — ABNORMAL LOW (ref >60)
Glucose, Bld: 52 mg/dL — ABNORMAL LOW (ref 70–99)
Potassium: 3.7 mmol/L (ref 3.5–5.1)
Sodium: 137 mmol/L (ref 135–145)

## 2024-06-18 LAB — GLUCOSE, CAPILLARY
Glucose-Capillary: 50 mg/dL — ABNORMAL LOW (ref 70–99)
Glucose-Capillary: 49 mg/dL — ABNORMAL LOW (ref 70–99)
Glucose-Capillary: 75 mg/dL (ref 70–99)

## 2024-06-18 MED ORDER — MIDODRINE HCL 5 MG PO TABS
5.0000 mg | ORAL_TABLET | Freq: Once | ORAL | Status: AC
  Administered 2024-06-18: 5 mg via ORAL
  Filled 2024-06-18: qty 1

## 2024-06-18 MED ORDER — SODIUM CHLORIDE 0.9 % IV SOLN
2.0000 g | INTRAVENOUS | Status: DC
  Administered 2024-06-18 – 2024-06-19 (×2): 2 g via INTRAVENOUS
  Filled 2024-06-18 (×2): qty 20

## 2024-06-18 MED ORDER — SODIUM CHLORIDE 0.9 % IV BOLUS
500.0000 mL | Freq: Once | INTRAVENOUS | Status: AC
  Administered 2024-06-18: 500 mL via INTRAVENOUS

## 2024-06-18 MED ORDER — MIDODRINE HCL 5 MG PO TABS
5.0000 mg | ORAL_TABLET | Freq: Three times a day (TID) | ORAL | Status: DC
  Administered 2024-06-18 – 2024-06-21 (×11): 5 mg via ORAL
  Filled 2024-06-18 (×11): qty 1

## 2024-06-18 MED ORDER — CHLORHEXIDINE GLUCONATE CLOTH 2 % EX PADS: 6.0000 | MEDICATED_PAD | Freq: Every day | CUTANEOUS | Status: DC

## 2024-06-18 NOTE — Plan of Care (Signed)
  Problem: Clinical Measurements: Goal: Ability to avoid or minimize complications of infection will improve Outcome: Not Progressing   Problem: Skin Integrity: Goal: Skin integrity will improve Outcome: Not Progressing   Problem: Education: Goal: Knowledge of General Education information will improve Description: Including pain rating scale, medication(s)/side effects and non-pharmacologic comfort measures Outcome: Progressing   Problem: Health Behavior/Discharge Planning: Goal: Ability to manage health-related needs will improve Outcome: Not Progressing   Problem: Clinical Measurements: Goal: Ability to maintain clinical measurements within normal limits will improve Outcome: Progressing Goal: Will remain free from infection Outcome: Not Progressing Goal: Diagnostic test results will improve Outcome: Progressing Goal: Respiratory complications will improve Outcome: Progressing   Problem: Clinical Measurements: Goal: Will remain free from infection Outcome: Not Progressing   Problem: Clinical Measurements: Goal: Respiratory complications will improve Outcome: Progressing

## 2024-06-18 NOTE — Progress Notes (Signed)
   06/18/24 0452  Vitals  BP (!) 87/63  MAP (mmHg) 71  MEWS Score  MEWS Temp 0  MEWS Systolic 1  MEWS Pulse 0  MEWS RR 0  MEWS LOC 0  MEWS Score 1  MEWS Score Color Green   Patient is hypotensive but asymptomatic. Opyd MD notified. Due to hx of receiving blood due to hypotension follow by a confirmed low hemoglobin on this admission, advised to get a CBC.

## 2024-06-18 NOTE — Progress Notes (Addendum)
 [facility] Progress Note   Subjective:   Seen in room. Had dialysis yesterday with minimal UF.  No complaints. Denies cp, sob,  n/v.   Objective Vitals:   06/18/24 0001 06/18/24 0449 06/18/24 0452 06/18/24 0832  BP: (!) 88/68 (!) 90/39 (!) 87/63 (!) 92/56  Pulse:    (!) 52  Resp:  20  20  Temp: 98.8 F (37.1 C) 99.5 F (37.5 C)    TempSrc: Oral Oral    SpO2: 96% 95%  96%  Weight:      Height:       Physical Exam General: alert male in NAD Heart: RRR, no murmurs Lungs: CTA bilaterally, respirations unlabored Abdomen: Soft, non-distended, +BS Extremities: No edema Dialysis Access: LUE AVF + t/b  Additional Objective Labs: Basic Metabolic Panel: Recent Labs  Lab 06/14/24 0330 06/14/24 2359 06/17/24 0546 06/18/24 0534  NA 144 140 141 137  K 3.0* 3.8 4.6 3.7  CL 97* 99 100 99  CO2 28 27 23 22   GLUCOSE 101* 71 83 52*  BUN 17 9 29* 14  CREATININE 2.86* 1.71* 3.73* 2.32*  CALCIUM  8.0* 7.6* 8.4* 7.9*  PHOS 3.5  --  4.0  --    Liver Function Tests: Recent Labs  Lab 06/13/24 1605 06/14/24 0330 06/14/24 2359  AST 17  --  13*  ALT 11  --  8  ALKPHOS 92  --  68  BILITOT 0.6  --  1.3*  PROT 6.1*  --  5.7*  ALBUMIN  2.0* 1.9* 2.3*   No results for input(s): LIPASE, AMYLASE in the last 168 hours. CBC: Recent Labs  Lab 06/14/24 0330 06/14/24 1216 06/15/24 0406 06/15/24 1709 06/16/24 0513 06/17/24 0546 06/18/24 0534  WBC 13.7*   < > 11.6* 12.4* 12.7* 12.6* 11.7*  NEUTROABS 12.1*  --   --   --   --   --  10.3*  HGB 9.6*   < > 8.8* 10.8* 10.8* 11.3* 11.1*  HCT 30.9*   < > 28.4* 34.8* 34.3* 36.3* 36.0*  MCV 98.4   < > 99.0 96.1 96.1 97.3 98.4  PLT 242   < > 211 218 205 210 178   < > = values in this interval not displayed.   Blood Culture    Component Value Date/Time   SDES BLOOD BLOOD RIGHT HAND 06/13/2024 2257   SPECREQUEST  06/13/2024 2257    BOTTLES DRAWN AEROBIC AND ANAEROBIC Blood Culture results may not be optimal due to an  inadequate volume of blood received in culture bottles   CULT (A) 06/13/2024 2257    STAPHYLOCOCCUS HOMINIS THE SIGNIFICANCE OF ISOLATING THIS ORGANISM FROM A SINGLE SET OF BLOOD CULTURES WHEN MULTIPLE SETS ARE DRAWN IS UNCERTAIN. PLEASE NOTIFY THE MICROBIOLOGY DEPARTMENT WITHIN ONE WEEK IF SPECIATION AND SENSITIVITIES ARE REQUIRED. Performed at [facility], [address] [place], [place] [address]    REPTSTATUS 06/16/2024 FINAL 06/13/2024 2257    Cardiac Enzymes: No results for input(s): CKTOTAL, CKMB, CKMBINDEX, TROPONINI in the last 168 hours. CBG: Recent Labs  Lab 06/15/24 0323 06/15/24 0352 06/18/24 0816 06/18/24 0848 06/18/24 0928  GLUCAP 62* 171* 50* 49* 75   Iron  Studies: No results for input(s): IRON , TIBC, TRANSFERRIN, FERRITIN in the last 72 hours. @lablastinr3 @ Studies/Results: No results found. Medications:  ceFEPime  (MAXIPIME ) IV Stopped (06/17/24 2320)   vancomycin  Stopped (06/17/24 1755)    amiodarone   200 mg Oral Daily   atorvastatin   20 mg Oral QHS   feeding supplement  237 mL Oral BID BM   hydrocerin   Topical BID   liver oil-zinc  oxide   Topical BID   midodrine   5 mg Oral TID WC   pantoprazole  (PROTONIX ) IV  40 mg Intravenous BID   tamsulosin   0.4 mg Oral Q M,W,F    Dialysis Orders:  G-[facility] MWF 3h B400  52.3kg  AVF  Heparin  none Last OP HD 11/12, post wt 51.3kg Mircera 225 mcg q 2wks, last 11/12, due 11/26 Old Hb's: 8.0 on 11/12, 9.0 on 11.06, 8.9 on 10/29  Assessment/Plan: GIB/Melena. H/o AVMs. Received 2 u prbcs. S/p EGD w/o acute findings. Elqiuis on hold.  ESRD. on HD MWF. Continue per usual schedule. HTN.  BP have been low, received small fluid bolus and 1 unit PRBC with some improvement Volume.  euvolemic on exam, was under dry wt by 1kg after last OP HD on Wed. Received PRBC and saline, still appears euvolemic. On midodrine  5mg  TID Anemia. Hb 11 s/p prbcs. No ESA indication at present.  HFrEF. Optimze olume  with HD  Maisie Fellows [place] Jr PA-C 06/18/2024,9:32 AM

## 2024-06-18 NOTE — Progress Notes (Signed)
 PROGRESS NOTE    Emmanuell Kantz  FMW:[id number] DOB: 06-May-1956 DOA: 06/13/2024 PCP: Patient, No Pcp Per    Brief Narrative:  This 68 y.o. 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  His vitals were stable on arrival but noted to have large amount of melena in his diaper.  Hemoglobin 8.3 from baseline of 9 positive FOBT.  Patient was admitted and started on IV PPIs, GI was consulted,  Patient was given 1 unit of PRBC. Patient underwent EGD which was unremarkable.  Patient declined to have colonoscopy. H&H remained stable.  Patient has been refusing colonoscopy.  Patient is getting hemodialysis.  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 Hemoglobin 8.8 on admission  s/p 2 unit of PRBC Hb improved to 11.1 EGD this admission with no obvious bleeding. Colonoscopy was also discussed but patient declined. H/H has been improving.  Hb 10.8 > 11.3 Continue with IV Protonix  Keep holding Eliquis .will discuss with GI about resumption.   Paroxysmal atrial fibrillation (HCC) Currently in sinus rhythm. HR controlled. Continuing home amiodarone . Holding  metoprolol  due to softer blood pressure Holding home Eliquis  due to GI bleed.   Chronic HFrEF (heart failure with reduced ejection fraction) (HCC) Prior echo done on 08/20/2022 with EF of 40 to 45%.  Clinically appears euvolemic. Volume is being managed with dialysis.   Gangrene of penis: Concern of superadded cellulitis: Recently diagnosed with gangrene of the tip of penis at urology office. Started on broad-spectrum antibiotics with cefepime  and vancomycin  for concern of superadded cellulitis. Continue with supportive care 11/16, blood culture growing GPC, staphylococcus hominis. Discussed with pharmacist antibiotics changed to ceftriaxone .     Hypotension: BP was soft initially. He was continued midodrine  5 mg p.o. 3 times daily Holding metoprolol     ESRD on hemodialysis [facility]): Hemodialysis MWF schedule Nephrology following, next HD will be today.     Hyperlipemia: Continue with home statin.   Pressure injury: Patient was found to have multiple unstageable pressure injuries, POA. Involving buttock, ischium and heel. -Wound care was consulted.   BPH (benign prostatic hyperplasia) Continue Flomax .   DVT prophylaxis: SCDs Code Status:Full code Family Communication: No family at bed side Disposition Plan:    Status is: Inpatient Remains inpatient appropriate because: Anticipated discharge back to SNF in 1-2 days. Medically clear.   Consultants:  Nephrology Gastroenterology  Procedures: EGD  Antimicrobials:  Anti-infectives (From admission, onward)    Start     Dose/Rate Route Frequency Ordered Stop   06/18/24 2000  cefTRIAXone  (ROCEPHIN ) 2 g in sodium chloride  0.9 % 100 mL IVPB        2 g 200 mL/hr  over 30 Minutes Intravenous Every 24 hours 06/18/24 1249     06/17/24 1600  vancomycin  (VANCOREADY) IVPB 500 mg/100 mL  Status:  Discontinued        500 mg 100 mL/hr over 60 Minutes Intravenous Every M-W-F (Hemodialysis) 06/16/24 0812 06/18/24 1249   06/14/24  1800  vancomycin  (VANCOREADY) IVPB 500 mg/100 mL        500 mg 100 mL/hr over 60 Minutes Intravenous  Once 06/14/24 1311 06/14/24 1916   06/13/24 2300  vancomycin  (VANCOREADY) IVPB 1250 mg/250 mL        1,250 mg 166.7 mL/hr over 90 Minutes Intravenous  Once 06/13/24 2250 06/14/24 0557   06/13/24 2300  ceFEPIme  (MAXIPIME ) 1 g in sodium chloride  0.9 % 100 mL IVPB  Status:  Discontinued        1 g 200 mL/hr over 30 Minutes Intravenous Every 24 hours 06/13/24 2250 06/18/24 1249      Subjective: Patient was seen and examined at bedside.  Overnight events noted. Patient reports feeling very weak , states he does not feel ready for discharge. Denies any further bleeding,  He refuses to have colonoscopy.  Objective: Vitals:   06/18/24 0449 06/18/24 0452 06/18/24 0832 06/18/24 1100  BP: (!) 90/39 (!) 87/63 (!) 92/56 (!) 86/56  Pulse:   (!) 52 68  Resp: 20  20 19   Temp: 99.5 F (37.5 C)   99.1 F (37.3 C)  TempSrc: Oral   Oral  SpO2: 95%  96% 94%  Weight:      Height:        Intake/Output Summary (Last 24 hours) at 06/18/2024 1410 Last data filed at 06/18/2024 0400 Gross per 24 hour  Intake 500 ml  Output 0 ml  Net 500 ml   Filed Weights   06/14/24 1307 06/14/24 1706 06/17/24 1719  Weight: 52.7 kg 50.9 kg 53.1 kg    Examination:  General exam: Appears calm and comfortable, deconditioned, not in any acute distress. Respiratory system: Clear to auscultation. Respiratory effort normal.  RR 14 Cardiovascular system: S1 & S2 heard, RRR. No JVD, murmurs, rubs, gallops or clicks.  Gastrointestinal system: Abdomen is non distended, soft and non tender. Normal bowel sounds heard. Central nervous system: Alert and oriented X 3. No focal neurological deficits. Extremities: No Edema, no cyanosis, no clubbing. Skin: No rashes, lesions or ulcers Psychiatry: Judgement and insight appear normal. Mood & affect appropriate.     Data Reviewed: I have personally reviewed following labs and  imaging studies  CBC: Recent Labs  Lab 06/14/24 0330 06/14/24 1216 06/15/24 0406 06/15/24 1709 06/16/24 0513 06/17/24 0546 06/18/24 0534  WBC 13.7*   < > 11.6* 12.4* 12.7* 12.6* 11.7*  NEUTROABS 12.1*  --   --   --   --   --  10.3*  HGB 9.6*   < > 8.8* 10.8* 10.8* 11.3* 11.1*  HCT 30.9*   < > 28.4* 34.8* 34.3* 36.3* 36.0*  MCV 98.4   < > 99.0 96.1 96.1 97.3 98.4  PLT 242   < > 211 218 205 210 178   < > = values in this interval not displayed.   Basic Metabolic Panel: Recent Labs  Lab 06/13/24 1605 06/14/24 0330 06/14/24 2359 06/17/24 0546 06/18/24 0534  NA 141 144 140 141 137  K 2.8* 3.0* 3.8 4.6 3.7  CL 94* 97* 99 100 99  CO2 31 28 27 23 22   GLUCOSE 130* 101* 71 83 52*  BUN 13 17 9  29*  14  CREATININE 2.60* 2.86* 1.71* 3.73* 2.32*  CALCIUM  7.7* 8.0* 7.6* 8.4* 7.9*  MG  --   --   --  1.8  --   PHOS  --  3.5  --  4.0  --    GFR: Estimated Creatinine Clearance: 23.5 mL/min (A) (by C-G formula based on SCr of 2.32 mg/dL (H)). Liver Function Tests: Recent Labs  Lab 06/13/24 1605 06/14/24 0330 06/14/24 2359  AST 17  --  13*  ALT 11  --  8  ALKPHOS 92  --  68  BILITOT 0.6  --  1.3*  PROT 6.1*  --  5.7*  ALBUMIN  2.0* 1.9* 2.3*   No results for input(s): LIPASE, AMYLASE in the last 168 hours. No results for input(s): AMMONIA in the last 168 hours. Coagulation Profile: No results for input(s): INR, PROTIME in the last 168 hours. Cardiac Enzymes: No results for input(s): CKTOTAL, CKMB, CKMBINDEX, TROPONINI in the last 168 hours. BNP (last 3 results) No results for input(s): PROBNP in the last 8760 hours. HbA1C: No results for input(s): HGBA1C in the last 72 hours. CBG: Recent Labs  Lab 06/15/24 0323 06/15/24 0352 06/18/24 0816 06/18/24 0848 06/18/24 0928  GLUCAP 62* 171* 50* 49* 75   Lipid Profile: No results for input(s): CHOL, HDL, LDLCALC, TRIG, CHOLHDL, LDLDIRECT in the last 72 hours. Thyroid Function Tests: No  results for input(s): TSH, T4TOTAL, FREET4, T3FREE, THYROIDAB in the last 72 hours. Anemia Panel: No results for input(s): VITAMINB12, FOLATE, FERRITIN, TIBC, IRON , RETICCTPCT in the last 72 hours. Sepsis Labs: Recent Labs  Lab 06/14/24 2359  LATICACIDVEN 0.9    Recent Results (from the past 240 hours)  Culture, blood (Routine X 2) w Reflex to ID Panel     Status: None (Preliminary result)   Collection Time: 06/13/24 10:52 PM   Specimen: BLOOD  Result Value Ref Range Status   Specimen Description BLOOD RIGHT ANTECUBITAL  Final   Special Requests   Final    BOTTLES DRAWN AEROBIC AND ANAEROBIC Blood Culture adequate volume   Culture   Final    NO GROWTH 4 DAYS Performed at [facility], [address], [place], [place] [address]    Report Status PENDING  Incomplete  Culture, blood (Routine X 2) w Reflex to ID Panel     Status: Abnormal   Collection Time: 06/13/24 10:57 PM   Specimen: BLOOD  Result Value Ref Range Status   Specimen Description BLOOD BLOOD RIGHT HAND  Final   Special Requests   Final    BOTTLES DRAWN AEROBIC AND ANAEROBIC Blood Culture results may not be optimal due to an inadequate volume of blood received in culture bottles   Culture  Setup Time   Final    GRAM POSITIVE COCCI AEROBIC BOTTLE ONLY CRITICAL RESULT CALLED TO, READ BACK BY AND VERIFIED WITH: PHARMD GREG ABBOTT [phone] [id number] BY J RAZZAK, MT    Culture (A)  Final    STAPHYLOCOCCUS HOMINIS THE SIGNIFICANCE OF ISOLATING THIS ORGANISM FROM A SINGLE SET OF BLOOD CULTURES WHEN MULTIPLE SETS ARE DRAWN IS UNCERTAIN. PLEASE NOTIFY THE MICROBIOLOGY DEPARTMENT WITHIN ONE WEEK IF SPECIATION AND SENSITIVITIES ARE REQUIRED. Performed at [facility], [address] [place], [place] [address]    Report Status 06/16/2024 FINAL  Final  Blood Culture ID Panel (Reflexed)     Status: Abnormal   Collection Time: 06/13/24 10:57 PM  Result Value Ref Range Status   Enterococcus faecalis  NOT DETECTED  NOT DETECTED Final   Enterococcus Faecium NOT DETECTED NOT DETECTED Final   Listeria monocytogenes NOT DETECTED NOT DETECTED Final   Staphylococcus species DETECTED (A) NOT DETECTED Final    Comment: CRITICAL RESULT CALLED TO, READ BACK BY AND VERIFIED WITH: PHARMD GREG ABBOTT [phone] [id number] BY J RAZZAK, MT    Staphylococcus aureus (BCID) NOT DETECTED NOT DETECTED Final   Staphylococcus epidermidis NOT DETECTED NOT DETECTED Final   Staphylococcus lugdunensis NOT DETECTED NOT DETECTED Final   Streptococcus species NOT DETECTED NOT DETECTED Final   Streptococcus agalactiae NOT DETECTED NOT DETECTED Final   Streptococcus pneumoniae NOT DETECTED NOT DETECTED Final   Streptococcus pyogenes NOT DETECTED NOT DETECTED Final   A.calcoaceticus-baumannii NOT DETECTED NOT DETECTED Final   Bacteroides fragilis NOT DETECTED NOT DETECTED Final   Enterobacterales NOT DETECTED NOT DETECTED Final   Enterobacter cloacae complex NOT DETECTED NOT DETECTED Final   Escherichia coli NOT DETECTED NOT DETECTED Final   Klebsiella aerogenes NOT DETECTED NOT DETECTED Final   Klebsiella oxytoca NOT DETECTED NOT DETECTED Final   Klebsiella pneumoniae NOT DETECTED NOT DETECTED Final   Proteus species NOT DETECTED NOT DETECTED Final   Salmonella species NOT DETECTED NOT DETECTED Final   Serratia marcescens NOT DETECTED NOT DETECTED Final   Haemophilus influenzae NOT DETECTED NOT DETECTED Final   Neisseria meningitidis NOT DETECTED NOT DETECTED Final   Pseudomonas aeruginosa NOT DETECTED NOT DETECTED Final   Stenotrophomonas maltophilia NOT DETECTED NOT DETECTED Final   Candida albicans NOT DETECTED NOT DETECTED Final   Candida auris NOT DETECTED NOT DETECTED Final   Candida glabrata NOT DETECTED NOT DETECTED Final   Candida krusei NOT DETECTED NOT DETECTED Final   Candida parapsilosis NOT DETECTED NOT DETECTED Final   Candida tropicalis NOT DETECTED NOT DETECTED Final   Cryptococcus neoformans/gattii  NOT DETECTED NOT DETECTED Final    Comment: Performed at [facility], [address], [place], [place] [address]  MRSA Next Gen by PCR, Nasal     Status: None   Collection Time: 06/14/24  6:26 AM   Specimen: Nasal Mucosa; Nasal Swab  Result Value Ref Range Status   MRSA by PCR Next Gen NOT DETECTED NOT DETECTED Final    Comment: (NOTE) The GeneXpert MRSA Assay (FDA approved for NASAL specimens only), is one component of a comprehensive MRSA colonization surveillance program. It is not intended to diagnose MRSA infection nor to guide or monitor treatment for MRSA infections. Test performance is not FDA approved in patients less than 29 years old. Performed at [facility], [address], [place], [place] [address]     Radiology Studies: No results found.  Scheduled Meds:  amiodarone   200 mg Oral Daily   atorvastatin   20 mg Oral QHS   feeding supplement  237 mL Oral BID BM   hydrocerin   Topical BID   liver oil-zinc oxide   Topical BID   midodrine   5 mg Oral TID WC   pantoprazole  (PROTONIX ) IV  40 mg Intravenous BID   tamsulosin   0.4 mg Oral Q M,W,F   Continuous Infusions:  cefTRIAXone  (ROCEPHIN )  IV       LOS: 5 days    Time spent: 35 mins    Darcel Dawley, MD [facility]   If 7PM-7AM, please contact night-coverage

## 2024-06-19 ENCOUNTER — Inpatient Hospital Stay (HOSPITAL_COMMUNITY)

## 2024-06-19 DIAGNOSIS — K922 Gastrointestinal hemorrhage, unspecified: Secondary | ICD-10-CM | POA: Diagnosis not present

## 2024-06-19 LAB — BASIC METABOLIC PANEL WITH GFR
Anion gap: 17 — ABNORMAL HIGH (ref 5–15)
BUN: 22 mg/dL (ref 8–23)
CO2: 24 mmol/L (ref 22–32)
Calcium: 8.1 mg/dL — ABNORMAL LOW (ref 8.9–10.3)
Chloride: 98 mmol/L (ref 98–111)
Creatinine, Ser: 3.23 mg/dL — ABNORMAL HIGH (ref 0.61–1.24)
GFR, Estimated: 20 mL/min — ABNORMAL LOW (ref >60)
Glucose, Bld: 120 mg/dL — ABNORMAL HIGH (ref 70–99)
Potassium: 4.3 mmol/L (ref 3.5–5.1)
Sodium: 139 mmol/L (ref 135–145)

## 2024-06-19 LAB — CBC
HCT: 36.9 % — ABNORMAL LOW (ref 39.0–52.0)
Hemoglobin: 11.4 g/dL — ABNORMAL LOW (ref 13.0–17.0)
MCH: 30 pg (ref 26.0–34.0)
MCHC: 30.9 g/dL (ref 30.0–36.0)
MCV: 97.1 fL (ref 80.0–100.0)
Platelets: 165 K/uL (ref 150–400)
RBC: 3.8 MIL/uL — ABNORMAL LOW (ref 4.22–5.81)
RDW: 19.8 % — ABNORMAL HIGH (ref 11.5–15.5)
WBC: 10.3 K/uL (ref 4.0–10.5)
nRBC: 0.2 % (ref 0.0–0.2)

## 2024-06-19 LAB — GLUCOSE, CAPILLARY: Glucose-Capillary: 118 mg/dL — ABNORMAL HIGH (ref 70–99)

## 2024-06-19 LAB — CULTURE, BLOOD (ROUTINE X 2)
Culture: NO GROWTH
Special Requests: ADEQUATE

## 2024-06-19 MED ORDER — PENTAFLUOROPROP-TETRAFLUOROETH EX AERO: 1.0000 | INHALATION_SPRAY | CUTANEOUS | Status: DC | PRN

## 2024-06-19 MED ORDER — ANTICOAGULANT SODIUM CITRATE 4% (200MG/5ML) IV SOLN: 5.0000 mL | Status: DC | PRN

## 2024-06-19 MED ORDER — SODIUM THIOSULFATE 250 MG/ML IV SOLN
25.0000 g | INTRAVENOUS | Status: DC
  Administered 2024-06-21: 25 g via INTRAVENOUS
  Filled 2024-06-19: qty 100

## 2024-06-19 MED ORDER — LIDOCAINE HCL (PF) 1 % IJ SOLN: 5.0000 mL | INTRAMUSCULAR | Status: DC | PRN

## 2024-06-19 MED ORDER — LIDOCAINE-PRILOCAINE 2.5-2.5 % EX CREA: 1.0000 | TOPICAL_CREAM | CUTANEOUS | Status: DC | PRN

## 2024-06-19 MED ORDER — HEPARIN SODIUM (PORCINE) 1000 UNIT/ML DIALYSIS: 1000.0000 [IU] | INTRAMUSCULAR | Status: DC | PRN

## 2024-06-19 MED ORDER — ALTEPLASE 2 MG IJ SOLR: 2.0000 mg | Freq: Once | INTRAMUSCULAR | Status: DC | PRN

## 2024-06-19 NOTE — Progress Notes (Signed)
 PROGRESS NOTE    [name]  FMW:[id number] DOB: 29-Jul-1956 DOA: 06/13/2024 PCP: Patient, No Pcp Per    Brief Narrative:  This 68 y.o. 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  His vitals were stable on arrival but noted to have large amount of melena in his diaper.  Hemoglobin 8.3 from baseline of 9 positive FOBT.  Patient was admitted and started on IV PPIs, GI was consulted,  Patient was given 1 unit of PRBC. Patient underwent EGD which was unremarkable.  Patient declined to have colonoscopy. H&H remained stable.  Patient has been refusing colonoscopy.  Patient is getting hemodialysis.  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 Hemoglobin 8.8 on admission  s/p 2 unit of PRBC Hb improved to 11.1 EGD this admission with no obvious bleeding. Colonoscopy was also discussed but patient declined. H/H has been improving.  Hb 10.8 > 11.3 . 11.4 Continue with IV Protonix  Keep holding Eliquis .will discuss with GI about resumption.  Paroxysmal atrial fibrillation (HCC) Currently in sinus rhythm. HR controlled. Continuing home  amiodarone . Holding metoprolol  due to softer blood pressure Holding home Eliquis  due to GI bleed.   Chronic HFrEF (heart failure with reduced ejection fraction) (HCC) Prior echo done on 08/20/2022 with EF of 40 to 45%.  Clinically appears euvolemic. Volume is being managed with dialysis.   Gangrene of penis: Concern of superadded cellulitis: Recently diagnosed with gangrene of the tip of penis at urology office. Started on broad-spectrum antibiotics with cefepime  and vancomycin  for concern of superadded cellulitis. Continue with supportive care 11/16, blood culture growing GPC, staphylococcus hominis. Discussed with pharmacist antibiotics changed to ceftriaxone . Urology is consulted for significantly swollen scrotum with gangrenous tip of penis     Hypotension: BP was soft initially. He was continued midodrine  5 mg p.o. 3 times daily Holding metoprolol     ESRD on hemodialysis [facility]): Hemodialysis MWF schedule Nephrology following, next HD will be today.     Hyperlipemia: Continue with home statin.   Pressure injury: Patient was found to have multiple unstageable pressure injuries, POA. Involving buttock, ischium and heel. -Wound care was consulted.   BPH (benign prostatic hyperplasia) Continue Flomax .   DVT prophylaxis: SCDs Code Status:Full code Family Communication: No family at bed side Disposition Plan:    Status is: Inpatient Remains inpatient appropriate because: Anticipated discharge back to SNF in 1-2 days. Medically clear.   Consultants:  Nephrology Gastroenterology  Procedures: EGD  Antimicrobials:  Anti-infectives (From admission, onward)    Start     Dose/Rate Route Frequency Ordered Stop   06/18/24 2000  cefTRIAXone  (ROCEPHIN ) 2 g in sodium chloride  0.9 % 100  mL IVPB        2 g 200 mL/hr over 30 Minutes Intravenous Every 24 hours 06/18/24 1249     06/17/24 1600  vancomycin  (VANCOREADY) IVPB 500 mg/100 mL  Status:  Discontinued        500  mg 100 mL/hr over 60 Minutes Intravenous Every M-W-F (Hemodialysis) 06/16/24 0812 06/18/24 1249   06/14/24 1800  vancomycin  (VANCOREADY) IVPB 500 mg/100 mL        500 mg 100 mL/hr over 60 Minutes Intravenous  Once 06/14/24 1311 06/14/24 1916   06/13/24 2300  vancomycin  (VANCOREADY) IVPB 1250 mg/250 mL        1,250 mg 166.7 mL/hr over 90 Minutes Intravenous  Once 06/13/24 2250 06/14/24 0557   06/13/24 2300  ceFEPIme  (MAXIPIME ) 1 g in sodium chloride  0.9 % 100 mL IVPB  Status:  Discontinued        1 g 200 mL/hr over 30 Minutes Intravenous Every 24 hours 06/13/24 2250 06/18/24 1249      Subjective: Patient was seen and examined at bedside.  Overnight events noted. Patient reports feeling better and states he is feeling discomfort in the scrotal area because of significant swelling. Denies any further bleeding,  He refuses to have colonoscopy.  Objective: Vitals:   06/19/24 0736 06/19/24 1136 06/19/24 1356 06/19/24 1421  BP: (!) 96/37 (!) 110/37 118/66 108/61  Pulse: 70 71 74 74  Resp: 20 20 (!) 30 (!) 34  Temp: 97.9 F (36.6 C) 98.5 F (36.9 C)    TempSrc: Oral Oral    SpO2: 100% 94% 95% 95%  Weight:   54.7 kg   Height:        Intake/Output Summary (Last 24 hours) at 06/19/2024 1422 Last data filed at 06/19/2024 1213 Gross per 24 hour  Intake 584.36 ml  Output --  Net 584.36 ml   Filed Weights   06/14/24 1706 06/17/24 1719 06/19/24 1356  Weight: 50.9 kg 53.1 kg 54.7 kg    Examination:  General exam: Appears calm and comfortable, deconditioned, not in any acute distress. Respiratory system: CTA Bilaterally . Respiratory effort normal.  RR 14 Cardiovascular system: S1 & S2 heard, RRR. No JVD, murmurs, rubs, gallops or clicks.  Gastrointestinal system: Abdomen is non distended, soft and non tender. Normal bowel sounds heard. Central nervous system: Alert and oriented X 3. No focal neurological deficits. Genito urinary: Difficulty scrotal swelling noted with blackening  of the tip of the penis. Extremities: No Edema, no cyanosis, no clubbing. Skin: No rashes, lesions or ulcers Psychiatry: Judgement and insight appear normal. Mood & affect appropriate.    Data Reviewed: I have personally reviewed following labs and imaging studies  CBC: Recent Labs  Lab 06/14/24 0330 06/14/24 1216 06/15/24 1709 06/16/24 0513 06/17/24 0546 06/18/24 0534 06/19/24 0449  WBC 13.7*   < > 12.4* 12.7* 12.6* 11.7* 10.3  NEUTROABS 12.1*  --   --   --   --  10.3*  --   HGB 9.6*   < > 10.8* 10.8* 11.3* 11.1* 11.4*  HCT 30.9*   < > 34.8* 34.3* 36.3* 36.0* 36.9*  MCV 98.4   < > 96.1 96.1 97.3 98.4 97.1  PLT 242   < > 218 205 210 178 165   < > = values in this interval not displayed.   Basic Metabolic Panel: Recent Labs  Lab 06/14/24 0330 06/14/24 2359 06/17/24 0546 06/18/24 0534 06/19/24 0449  NA 144 140 141 137 139  K 3.0* 3.8 4.6  3.7 4.3  CL 97* 99 100 99 98  CO2 28 27 23 22 24   GLUCOSE 101* 71 83 52* 120*  BUN 17 9 29* 14 22  CREATININE 2.86* 1.71* 3.73* 2.32* 3.23*  CALCIUM  8.0* 7.6* 8.4* 7.9* 8.1*  MG  --   --  1.8  --   --   PHOS 3.5  --  4.0  --   --    GFR: Estimated Creatinine Clearance: 17.4 mL/min (A) (by C-G formula based on SCr of 3.23 mg/dL (H)). Liver Function Tests: Recent Labs  Lab 06/13/24 1605 06/14/24 0330 06/14/24 2359  AST 17  --  13*  ALT 11  --  8  ALKPHOS 92  --  68  BILITOT 0.6  --  1.3*  PROT 6.1*  --  5.7*  ALBUMIN  2.0* 1.9* 2.3*   No results for input(s): LIPASE, AMYLASE in the last 168 hours. No results for input(s): AMMONIA in the last 168 hours. Coagulation Profile: No results for input(s): INR, PROTIME in the last 168 hours. Cardiac Enzymes: No results for input(s): CKTOTAL, CKMB, CKMBINDEX, TROPONINI in the last 168 hours. BNP (last 3 results) No results for input(s): PROBNP in the last 8760 hours. HbA1C: No results for input(s): HGBA1C in the last 72 hours. CBG: Recent Labs  Lab  06/15/24 0352 06/18/24 0816 06/18/24 0848 06/18/24 0928 06/19/24 0748  GLUCAP 171* 50* 49* 75 118*   Lipid Profile: No results for input(s): CHOL, HDL, LDLCALC, TRIG, CHOLHDL, LDLDIRECT in the last 72 hours. Thyroid Function Tests: No results for input(s): TSH, T4TOTAL, FREET4, T3FREE, THYROIDAB in the last 72 hours. Anemia Panel: No results for input(s): VITAMINB12, FOLATE, FERRITIN, TIBC, IRON , RETICCTPCT in the last 72 hours. Sepsis Labs: Recent Labs  Lab 06/14/24 2359  LATICACIDVEN 0.9    Recent Results (from the past 240 hours)  Culture, blood (Routine X 2) w Reflex to ID Panel     Status: None   Collection Time: 06/13/24 10:52 PM   Specimen: BLOOD  Result Value Ref Range Status   Specimen Description BLOOD RIGHT ANTECUBITAL  Final   Special Requests   Final    BOTTLES DRAWN AEROBIC AND ANAEROBIC Blood Culture adequate volume   Culture   Final    NO GROWTH 5 DAYS Performed at [facility], [address], [place], [place] [address]    Report Status 06/19/2024 FINAL  Final  Culture, blood (Routine X 2) w Reflex to ID Panel     Status: Abnormal   Collection Time: 06/13/24 10:57 PM   Specimen: BLOOD  Result Value Ref Range Status   Specimen Description BLOOD BLOOD RIGHT HAND  Final   Special Requests   Final    BOTTLES DRAWN AEROBIC AND ANAEROBIC Blood Culture results may not be optimal due to an inadequate volume of blood received in culture bottles   Culture  Setup Time   Final    GRAM POSITIVE COCCI AEROBIC BOTTLE ONLY CRITICAL RESULT CALLED TO, READ BACK BY AND VERIFIED WITH: PHARMD GREG ABBOTT [phone] [id number] BY J RAZZAK, MT    Culture (A)  Final    STAPHYLOCOCCUS HOMINIS THE SIGNIFICANCE OF ISOLATING THIS ORGANISM FROM A SINGLE SET OF BLOOD CULTURES WHEN MULTIPLE SETS ARE DRAWN IS UNCERTAIN. PLEASE NOTIFY THE MICROBIOLOGY DEPARTMENT WITHIN ONE WEEK IF SPECIATION AND SENSITIVITIES ARE REQUIRED. Performed at [facility], [address], [place], [place] [address]    Report Status 06/16/2024 FINAL  Final  Blood Culture  ID Panel (Reflexed)     Status: Abnormal   Collection Time: 06/13/24 10:57 PM  Result Value Ref Range Status   Enterococcus faecalis NOT DETECTED NOT DETECTED Final   Enterococcus Faecium NOT DETECTED NOT DETECTED Final   Listeria monocytogenes NOT DETECTED NOT DETECTED Final   Staphylococcus species DETECTED (A) NOT DETECTED Final    Comment: CRITICAL RESULT CALLED TO, READ BACK BY AND VERIFIED WITH: PHARMD GREG ABBOTT [phone] [id number] BY J RAZZAK, MT    Staphylococcus aureus (BCID) NOT DETECTED NOT DETECTED Final   Staphylococcus epidermidis NOT DETECTED NOT DETECTED Final   Staphylococcus lugdunensis NOT DETECTED NOT DETECTED Final   Streptococcus species NOT DETECTED NOT DETECTED Final   Streptococcus agalactiae NOT DETECTED NOT DETECTED Final   Streptococcus pneumoniae NOT DETECTED NOT DETECTED Final   Streptococcus pyogenes NOT DETECTED NOT DETECTED Final   A.calcoaceticus-baumannii NOT DETECTED NOT DETECTED Final   Bacteroides fragilis NOT DETECTED NOT DETECTED Final   Enterobacterales NOT DETECTED NOT DETECTED Final   Enterobacter cloacae complex NOT DETECTED NOT DETECTED Final   Escherichia coli NOT DETECTED NOT DETECTED Final   Klebsiella aerogenes NOT DETECTED NOT DETECTED Final   Klebsiella oxytoca NOT DETECTED NOT DETECTED Final   Klebsiella pneumoniae NOT DETECTED NOT DETECTED Final   Proteus species NOT DETECTED NOT DETECTED Final   Salmonella species NOT DETECTED NOT DETECTED Final   Serratia marcescens NOT DETECTED NOT DETECTED Final   Haemophilus influenzae NOT DETECTED NOT DETECTED Final   Neisseria meningitidis NOT DETECTED NOT DETECTED Final   Pseudomonas aeruginosa NOT DETECTED NOT DETECTED Final   Stenotrophomonas maltophilia NOT DETECTED NOT DETECTED Final   Candida albicans NOT DETECTED NOT DETECTED Final   Candida auris NOT DETECTED NOT DETECTED Final    Candida glabrata NOT DETECTED NOT DETECTED Final   Candida krusei NOT DETECTED NOT DETECTED Final   Candida parapsilosis NOT DETECTED NOT DETECTED Final   Candida tropicalis NOT DETECTED NOT DETECTED Final   Cryptococcus neoformans/gattii NOT DETECTED NOT DETECTED Final    Comment: Performed at [facility], [address], [place], [place] [address]  MRSA Next Gen by PCR, Nasal     Status: None   Collection Time: 06/14/24  6:26 AM   Specimen: Nasal Mucosa; Nasal Swab  Result Value Ref Range Status   MRSA by PCR Next Gen NOT DETECTED NOT DETECTED Final    Comment: (NOTE) The GeneXpert MRSA Assay (FDA approved for NASAL specimens only), is one component of a comprehensive MRSA colonization surveillance program. It is not intended to diagnose MRSA infection nor to guide or monitor treatment for MRSA infections. Test performance is not FDA approved in patients less than 73 years old. Performed at [facility], [address], [place], [place] [address]     Radiology Studies: No results found.  Scheduled Meds:  amiodarone   200 mg Oral Daily   atorvastatin   20 mg Oral QHS   feeding supplement  237 mL Oral BID BM   hydrocerin   Topical BID   liver oil-zinc oxide   Topical BID   midodrine   5 mg Oral TID WC   pantoprazole  (PROTONIX ) IV  40 mg Intravenous BID   tamsulosin   0.4 mg Oral Q M,W,F   Continuous Infusions:  anticoagulant sodium citrate      cefTRIAXone  (ROCEPHIN )  IV Stopped (06/18/24 2223)     LOS: 6 days    Time spent: 35 mins    Darcel Dawley, MD [facility]   If  7PM-7AM, please contact night-coverage

## 2024-06-19 NOTE — TOC Progression Note (Signed)
 Transition of Care [facility]) - Progression Note    Patient Details  Name: [name] MRN: [id number] Date of Birth: 12-May-1956  [facility] CM/SW Contact  Isaiah Public, LCSWA Phone Number: 06/19/2024, 12:53 PM  Clinical Narrative:     Patient has SNF bed at [facility] when medically ready. MD informed CSW that patient not medically ready today. CSW updated [place] with [employer]. CSW will continue to follow.  Expected Discharge Plan: Long Term Nursing Home Barriers to Discharge: Continued Medical Work up               Expected Discharge Plan and Services In-house Referral: Clinical Social Work   Post Acute Care Choice: Skilled Nursing Facility Living arrangements for the past 2 months: Skilled Nursing Facility                                       Social Drivers of Health (SDOH) Interventions SDOH Screenings   Food Insecurity: Patient Declined (06/15/2024)  Housing: Unknown (06/15/2024)  Transportation Needs: Patient Declined (06/15/2024)  Utilities: Patient Declined (06/15/2024)  Social Connections: Patient Declined (06/15/2024)  Tobacco Use: Low Risk  (06/13/2024)    Readmission Risk Interventions     No data to display

## 2024-06-19 NOTE — Consult Note (Addendum)
 Urology Consult Note   Requesting Attending Physician:  Leotis Bogus, MD Service Providing Consult: Urology  Consulting Attending: Dr. Shane   Reason for Consult: Penile calciphylaxis  HPI: [name] is seen in consultation for reasons noted above at the request of Leotis Bogus, MD. Patient is a 68 y.o. male presenting to the emergency department via EMS from SNF due to bright red blood in stool this morning.  PMH significant for chronic HFrEF with LVEF of 40 to 45%, CAD, paroxysmal A-fib on Eliquis , previous GI bleed, and ESRD on HD M/W/F.  He is a patient of ours and was seen for the same by Dr. Lovie in clinic on 06/13/2024.  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  Patient was undergoing hemodialysis.  He was in a difficult mood and would not open his eyes, avoiding conversation.  He eventually did look at me and responded to a few questions with short nondescript answers.  He also refused physical examination.  This is unlikely to have changed significantly since last week.  ------------------  Assessment:   68 y.o. male with ESRD on HD, now with penile calciphylaxis of glans     Document Information    Recommendations: # Penile calciphylaxis  The most beneficial direction in the case of penile calciphylaxis is close monitoring and allowing for autoamputation.  The lack of blood flow which causes the condition in the first place, will prevent the patient from healing appropriately.  In extreme situations with advancing infection, partial penectomy is considered.   There is some purulent drainage in this case, but pt refuses physical exam. Awaiting CT imaging. CT imaging 1 year ago shows calcium  deposition through corporal bodies and through the penile vasculature, including the glans. There is a role for sodium thiosulfate, typically initiated and managed by nephrology.  Academic diagnosis would include  punch biopsy. Place Xeroform gauze and monitor closely.  Patient can follow-up with urology on an outpatient basis in 2 to 4 weeks.  Will not need to follow.  Please call with questions or concerns.  Afebrile and WBC WNL  Case and plan discussed with Dr. Shane  Past Medical History: Past Medical History:  Diagnosis Date   CHF (congestive heart failure) (HCC)    Diabetes mellitus without complication (HCC)    PATIENT JUST LEARNED HE WAS DIABETIC   ESRD on hemodialysis (HCC)    TTS at [facility]   Hepatitis    Hyperlipemia    Hypertension    Necrotizing fasciitis (HCC)    Pneumonia    HX OF PNA    Past Surgical History:  Past Surgical History:  Procedure Laterality Date   A/V FISTULAGRAM N/A 08/16/2023   Procedure: A/V Fistulagram;  Surgeon: Melia Lynwood ORN, MD;  Location: [facility] INVASIVE CV LAB;  Service: Cardiovascular;  Laterality: N/A;   AMPUTATION Left 09/11/2013   Procedure: AMPUTATION BELOW KNEE;  Surgeon: Kay Ozell Cummins, MD;  Location: [facility] OR;  Service: Orthopedics;  Laterality: Left;   AMPUTATION Right 12/18/2013   Procedure: RIGHT FOOT 1,2, TOE AMPUTATION  5th toe RAY AMPUTATION;  Surgeon: Kay Ozell Cummins, MD;  Location: [facility] OR;  Service: Orthopedics;  Laterality: Right;   ARTERY REPAIR Left 04/23/2024   Procedure: PLICATION OF AV FISTULA;  Surgeon: Lanis Fonda BRAVO, MD;  Location: [facility] OR;  Service: Vascular;  Laterality: Left;   AV FISTULA PLACEMENT Left 12/11/2019   Procedure: LEFT BRACHIOCEPHALIC FISTULA CREATION;  Surgeon: Oris Krystal FALCON, MD;  Location: [facility] OR;  Service: Vascular;  Laterality: Left;   BIOPSY  07/19/2023   Procedure: BIOPSY;  Surgeon: Albertus Gordy HERO, MD;  Location: [facility] ENDOSCOPY;  Service: Gastroenterology;;   CHOLECYSTECTOMY     ESOPHAGOGASTRODUODENOSCOPY N/A 06/15/2024   Procedure: EGD (ESOPHAGOGASTRODUODENOSCOPY);  Surgeon: Rollin Dover, MD;  Location: [facility] ENDOSCOPY;  Service: Gastroenterology;  Laterality: N/A;   ESOPHAGOGASTRODUODENOSCOPY (EGD) WITH  PROPOFOL  N/A 07/19/2023   Procedure: ESOPHAGOGASTRODUODENOSCOPY (EGD) WITH PROPOFOL ;  Surgeon: Albertus Gordy HERO, MD;  Location: [facility] ENDOSCOPY;  Service: Gastroenterology;  Laterality: N/A;   HOT HEMOSTASIS N/A 07/19/2023   Procedure: HOT HEMOSTASIS (ARGON PLASMA COAGULATION/BICAP);  Surgeon: Albertus Gordy HERO, MD;  Location: [facility] ENDOSCOPY;  Service: Gastroenterology;  Laterality: N/A;  stomach   I & D EXTREMITY Left 09/03/2013   Procedure: IRRIGATION AND DEBRIDEMENT EXTREMITY;  Surgeon: Kay Ozell Cummins, MD;  Location: [facility] OR;  Service: Orthopedics;  Laterality: Left;   I & D EXTREMITY Left 09/11/2013   Procedure: LEFT FOOT IRRIGATION AND DEBRIDEMENT;  Surgeon: Kay Ozell Cummins, MD;  Location: [facility] OR;  Service: Orthopedics;  Laterality: Left;   INSERTION OF DIALYSIS CATHETER  12/11/2019   Procedure: Insertion Of Dialysis Catheter;  Surgeon: Oris Krystal FALCON, MD;  Location: [facility] OR;  Service: Vascular;;   INSERTION OF DIALYSIS CATHETER Right 07/11/2022   Procedure: INSERTION OF TUNNELED DIALYSIS CATHETER;  Surgeon: Eliza Lonni RAMAN, MD;  Location: [facility] OR;  Service: Vascular;  Laterality: Right;   LEFT HEART CATH AND CORONARY ANGIOGRAPHY N/A 12/13/2019   Procedure: LEFT HEART CATH AND CORONARY ANGIOGRAPHY;  Surgeon: Dann Candyce RAMAN, MD;  Location: [facility] INVASIVE CV LAB;  Service: Cardiovascular;  Laterality: N/A;   PERIPHERAL VASCULAR BALLOON ANGIOPLASTY Left 08/16/2023   Procedure: PERIPHERAL VASCULAR BALLOON ANGIOPLASTY;  Surgeon: Melia Lynwood ORN, MD;  Location: [facility] INVASIVE CV LAB;  Service: Cardiovascular;  Laterality: Left;   REVISON OF ARTERIOVENOUS FISTULA Left 07/11/2022   Procedure: REVISON OF LEFT ARM ARTERIOVENOUS FISTULA;  Surgeon: Eliza Lonni RAMAN, MD;  Location: [facility] OR;  Service: Vascular;  Laterality: Left;   REVISON OF ARTERIOVENOUS FISTULA Left 04/23/2024   Procedure: REVISON OF ARTERIOVENOUS FISTULA;  Surgeon: Lanis Fonda BRAVO, MD;  Location: [facility] OR;  Service: Vascular;  Laterality: Left;    RIGHT HEART CATH N/A 12/13/2019   Procedure: RIGHT HEART CATH;  Surgeon: Dann Candyce RAMAN, MD;  Location: [facility] INVASIVE CV LAB;  Service: Cardiovascular;  Laterality: N/A;    Medication: Current Facility-Administered Medications  Medication Dose Route Frequency Provider Last Rate Last Admin   acetaminophen  (TYLENOL ) tablet 500 mg  500 mg Oral Q6H PRN Hall, Carole N, DO   500 mg at 06/15/24 2241   alteplase  (CATHFLO ACTIVASE ) injection 2 mg  2 mg Intracatheter Once PRN Jake Maisie Fellows, PA-C       amiodarone  (PACERONE ) tablet 200 mg  200 mg Oral Daily Amin, Sumayya, MD   200 mg at 06/19/24 0950   anticoagulant sodium citrate  solution 5 mL  5 mL Intracatheter PRN Ejigiri, Maisie Fellows, PA-C       atorvastatin  (LIPITOR) tablet 20 mg  20 mg Oral QHS Amin, Sumayya, MD   20 mg at 06/18/24 2200   cefTRIAXone  (ROCEPHIN ) 2 g in sodium chloride  0.9 % 100 mL IVPB  2 g Intravenous Q24H Leotis Bogus, MD   Stopped at 06/18/24 2223   feeding supplement (ENSURE PLUS HIGH PROTEIN) liquid 237 mL  237 mL Oral BID BM Amin, Sumayya, MD   237 mL at 06/16/24 1021   heparin   injection 1,000 Units  1,000 Units Intracatheter PRN Ejigiri, Maisie Fellows, PA-C       hydrocerin (EUCERIN) cream   Topical BID Amin, Sumayya, MD   Given at 06/19/24 9048   HYDROmorphone  (DILAUDID ) injection 0.5 mg  0.5 mg Intravenous Q4H PRN Shona Laurence N, DO       lidocaine  (PF) (XYLOCAINE ) 1 % injection 5 mL  5 mL Intradermal PRN Jake Maisie Fellows, PA-C       lidocaine -prilocaine  (EMLA ) cream 1 Application  1 Application Topical PRN Ejigiri, Maisie Fellows, PA-C       liver oil-zinc  oxide (DESITIN) 40 % ointment   Topical BID Amin, Sumayya, MD   Given at 06/19/24 0951   melatonin tablet 5 mg  5 mg Oral QHS PRN Shona Laurence N, DO   5 mg at 06/18/24 2208   midodrine  (PROAMATINE ) tablet 5 mg  5 mg Oral TID WC Leotis Bogus, MD   5 mg at 06/19/24 1200   naloxone  (NARCAN ) injection 0.4 mg  0.4 mg Intravenous PRN Shona Laurence N, DO        oxyCODONE  (Oxy IR/ROXICODONE ) immediate release tablet 5-10 mg  5-10 mg Oral Q4H PRN Shona Laurence N, DO   5 mg at 06/18/24 1754   pantoprazole  (PROTONIX ) injection 40 mg  40 mg Intravenous BID Shona Laurence N, DO   40 mg at 06/18/24 2151   pentafluoroprop-tetrafluoroeth (GEBAUERS) aerosol 1 Application  1 Application Topical PRN Ejigiri, Ogechi Grace, PA-C       polyethylene glycol (MIRALAX  / GLYCOLAX ) packet 17 g  17 g Oral Daily PRN Shona Laurence N, DO       prochlorperazine  (COMPAZINE ) injection 5 mg  5 mg Intravenous Q6H PRN Shona Laurence N, DO       tamsulosin  (FLOMAX ) capsule 0.4 mg  0.4 mg Oral Q M,W,F Amin, Sumayya, MD   0.4 mg at 06/19/24 0950    Allergies: Allergies  Allergen Reactions   Ultram  [Tramadol ] Nausea And Vomiting    Social History: Social History   Tobacco Use   Smoking status: Never   Smokeless tobacco: Never  Vaping Use   Vaping status: Never Used  Substance Use Topics   Alcohol use: No   Drug use: No    Family History Family History  Problem Relation Age of Onset   Diabetes type II Mother    Dementia Mother    Heart disease Father     Review of Systems  Genitourinary:  Negative for dysuria, flank pain, frequency, hematuria and urgency.     Objective   Vital signs in last 24 hours: BP (!) 110/37 (BP Location: Right Arm)   Pulse 71   Temp 98.5 F (36.9 C) (Oral)   Resp 20   Ht 5' 2 (1.575 m)   Wt 53.1 kg   SpO2 94%   BMI 21.41 kg/m   Physical Exam General: appears older than stated age HEENT: [place]/AT Pulmonary: Normal work of breathing Cardiovascular: no cyanosis Abdomen: Soft, NTTP, nondistended GU: necrotic glans with weeping purulence, swollen scrotum. Refused touch/palpation   Most Recent Labs: Lab Results  Component Value Date   WBC 10.3 06/19/2024   HGB 11.4 (L) 06/19/2024   HCT 36.9 (L) 06/19/2024   PLT 165 06/19/2024    Lab Results  Component Value Date   NA 139 06/19/2024   K 4.3 06/19/2024   CL 98 06/19/2024    CO2 24 06/19/2024   BUN 22 06/19/2024   CREATININE 3.23 (H) 06/19/2024  CALCIUM  8.1 (L) 06/19/2024   MG 1.8 06/17/2024   PHOS 4.0 06/17/2024    Lab Results  Component Value Date   INR 1.1 03/22/2024     Urine Culture: @[id number] (laburin,org,[id number],r9621)@   IMAGING: No results found.  ------  Ole Bourdon, NP Pager: [phone]   Please contact the urology consult pager with any further questions/concerns.

## 2024-06-19 NOTE — Progress Notes (Signed)
 [facility] Progress Note   Subjective:   Seen in room. No new complaints. For dialysis today.   Objective Vitals:   06/19/24 0035 06/19/24 0036 06/19/24 0037 06/19/24 0515  BP:   (!) 90/52 (!) 101/48  Pulse: 69 69 69 69  Resp:   19 20  Temp:   98.3 F (36.8 C) 98.2 F (36.8 C)  TempSrc:    Oral  SpO2: 98% 99% 99% 100%  Weight:      Height:       Physical Exam General: alert male in NAD Heart: RRR, no murmurs Lungs: CTA bilaterally, respirations unlabored Abdomen: Soft, non-distended, +BS Extremities: No edema Dialysis Access: LUE AVF + t/b  Additional Objective Labs: Basic Metabolic Panel: Recent Labs  Lab 06/14/24 0330 06/14/24 2359 06/17/24 0546 06/18/24 0534 06/19/24 0449  NA 144   < > 141 137 139  K 3.0*   < > 4.6 3.7 4.3  CL 97*   < > 100 99 98  CO2 28   < > 23 22 24   GLUCOSE 101*   < > 83 52* 120*  BUN 17   < > 29* 14 22  CREATININE 2.86*   < > 3.73* 2.32* 3.23*  CALCIUM  8.0*   < > 8.4* 7.9* 8.1*  PHOS 3.5  --  4.0  --   --    < > = values in this interval not displayed.   Liver Function Tests: Recent Labs  Lab 06/13/24 1605 06/14/24 0330 06/14/24 2359  AST 17  --  13*  ALT 11  --  8  ALKPHOS 92  --  68  BILITOT 0.6  --  1.3*  PROT 6.1*  --  5.7*  ALBUMIN  2.0* 1.9* 2.3*   No results for input(s): LIPASE, AMYLASE in the last 168 hours. CBC: Recent Labs  Lab 06/14/24 0330 06/14/24 1216 06/15/24 1709 06/16/24 0513 06/17/24 0546 06/18/24 0534 06/19/24 0449  WBC 13.7*   < > 12.4* 12.7* 12.6* 11.7* 10.3  NEUTROABS 12.1*  --   --   --   --  10.3*  --   HGB 9.6*   < > 10.8* 10.8* 11.3* 11.1* 11.4*  HCT 30.9*   < > 34.8* 34.3* 36.3* 36.0* 36.9*  MCV 98.4   < > 96.1 96.1 97.3 98.4 97.1  PLT 242   < > 218 205 210 178 165   < > = values in this interval not displayed.   Blood Culture    Component Value Date/Time   SDES BLOOD BLOOD RIGHT HAND 06/13/2024 2257   SPECREQUEST  06/13/2024 2257    BOTTLES DRAWN AEROBIC AND  ANAEROBIC Blood Culture results may not be optimal due to an inadequate volume of blood received in culture bottles   CULT (A) 06/13/2024 2257    STAPHYLOCOCCUS HOMINIS THE SIGNIFICANCE OF ISOLATING THIS ORGANISM FROM A SINGLE SET OF BLOOD CULTURES WHEN MULTIPLE SETS ARE DRAWN IS UNCERTAIN. PLEASE NOTIFY THE MICROBIOLOGY DEPARTMENT WITHIN ONE WEEK IF SPECIATION AND SENSITIVITIES ARE REQUIRED. Performed at [facility], [address] [place], [place] [address]    REPTSTATUS 06/16/2024 FINAL 06/13/2024 2257    Cardiac Enzymes: No results for input(s): CKTOTAL, CKMB, CKMBINDEX, TROPONINI in the last 168 hours. CBG: Recent Labs  Lab 06/15/24 0352 06/18/24 0816 06/18/24 0848 06/18/24 0928 06/19/24 0748  GLUCAP 171* 50* 49* 75 118*   Iron  Studies: No results for input(s): IRON , TIBC, TRANSFERRIN, FERRITIN in the last 72 hours. @lablastinr3 @ Studies/Results: No  results found. Medications:  cefTRIAXone  (ROCEPHIN )  IV 2 g (06/18/24 2150)    amiodarone   200 mg Oral Daily   atorvastatin   20 mg Oral QHS   feeding supplement  237 mL Oral BID BM   hydrocerin   Topical BID   liver oil-zinc oxide   Topical BID   midodrine   5 mg Oral TID WC   pantoprazole  (PROTONIX ) IV  40 mg Intravenous BID   tamsulosin   0.4 mg Oral Q M,W,F    Dialysis Orders:  G-O MWF 3h B400  52.3kg  AVF  Heparin  none Last OP HD 11/12, post wt 51.3kg Mircera 225 mcg q 2wks, last 11/12, due 11/26 Old Hb's: 8.0 on 11/12, 9.0 on 11.06, 8.9 on 10/29  Assessment/Plan: GIB/Melena. H/o AVMs. Received 2 u prbcs. S/p EGD w/o acute findings. Elqiuis on hold.  ESRD. on HD MWF. Continue per usual schedule.  HTN.  BP have been low, received small fluid bolus and 1 unit PRBC with some improvement. On midodrine  5 mg Tid.  Volume.  euvolemic on exam, gets under dry weight. No gross overload on exam  Anemia. Hb 11 s/p prbcs. No ESA indication at present. HFrEF. Optimze volume with HD PAFib.  Eliquis /metoprolol  on hold   Maisie Fellows Lynae Pederson PA-C 06/19/2024,9:05 AM

## 2024-06-19 NOTE — Progress Notes (Signed)
   06/19/24 1822  Vitals  Temp 98 F (36.7 C)  Pulse Rate 86  Resp (!) 29  BP (!) 143/51  SpO2 95 %  O2 Device Room Air  Weight 55.9 kg  Type of Weight Post-Dialysis  Oxygen Therapy  Patient Activity (if Appropriate) In bed  Pulse Oximetry Type Continuous  Oximetry Probe Site Changed No  Post Treatment  Dialyzer Clearance Lightly streaked  Hemodialysis Intake (mL) 0 mL  Liters Processed 70.5  Fluid Removed (mL) 0 mL  Tolerated HD Treatment Yes  AVG/AVF Arterial Site Held (minutes) 7 minutes  AVG/AVF Venous Site Held (minutes) 7 minutes     06/19/24 1822  Vitals  Temp 98 F (36.7 C)  Pulse Rate 86  Resp (!) 29  BP (!) 143/51  SpO2 95 %  O2 Device Room Air  Weight 55.9 kg  Type of Weight Post-Dialysis  Oxygen Therapy  Patient Activity (if Appropriate) In bed  Pulse Oximetry Type Continuous  Oximetry Probe Site Changed No  Post Treatment  Dialyzer Clearance Lightly streaked  Hemodialysis Intake (mL) 0 mL  Liters Processed 70.5  Fluid Removed (mL) 0 mL  Tolerated HD Treatment Yes  AVG/AVF Arterial Site Held (minutes) 7 minutes  AVG/AVF Venous Site Held (minutes) 7 minutes     06/19/24 1822  Vitals  Temp 98 F (36.7 C)  Pulse Rate 86  Resp (!) 29  BP (!) 143/51  SpO2 95 %  O2 Device Room Air  Weight 55.9 kg  Type of Weight Post-Dialysis  Oxygen Therapy  Patient Activity (if Appropriate) In bed  Pulse Oximetry Type Continuous  Oximetry Probe Site Changed No  Post Treatment  Dialyzer Clearance Lightly streaked  Hemodialysis Intake (mL) 0 mL  Liters Processed 70.5  Fluid Removed (mL) 0 mL  Tolerated HD Treatment Yes  AVG/AVF Arterial Site Held (minutes) 7 minutes  AVG/AVF Venous Site Held (minutes) 7 minutes   Received patient in bed to unit.  Alert and oriented.  Informed consent signed and in chart.   TX duration:3.15  Patient tolerated well.  Transported back to the room  Alert, without acute distress.  Hand-off given to patient's nurse.    Access used: LUAF Access issues: no complications  Total UF removed: ran even per md order Medication(s) given: none   Delon LITTIE [facility]

## 2024-06-19 NOTE — Progress Notes (Signed)
 Patient is weeping from LUA. Also patient's BP remains low despite interventions given

## 2024-06-20 DIAGNOSIS — K922 Gastrointestinal hemorrhage, unspecified: Secondary | ICD-10-CM | POA: Diagnosis not present

## 2024-06-20 LAB — BASIC METABOLIC PANEL WITH GFR
Anion gap: 18 — ABNORMAL HIGH (ref 5–15)
BUN: 12 mg/dL (ref 8–23)
CO2: 25 mmol/L (ref 22–32)
Calcium: 8 mg/dL — ABNORMAL LOW (ref 8.9–10.3)
Chloride: 95 mmol/L — ABNORMAL LOW (ref 98–111)
Creatinine, Ser: 2.08 mg/dL — ABNORMAL HIGH (ref 0.61–1.24)
GFR, Estimated: 34 mL/min — ABNORMAL LOW (ref >60)
Glucose, Bld: 87 mg/dL (ref 70–99)
Potassium: 3.9 mmol/L (ref 3.5–5.1)
Sodium: 138 mmol/L (ref 135–145)

## 2024-06-20 LAB — CBC
HCT: 36.5 % — ABNORMAL LOW (ref 39.0–52.0)
Hemoglobin: 11.2 g/dL — ABNORMAL LOW (ref 13.0–17.0)
MCH: 29.6 pg (ref 26.0–34.0)
MCHC: 30.7 g/dL (ref 30.0–36.0)
MCV: 96.3 fL (ref 80.0–100.0)
Platelets: 161 K/uL (ref 150–400)
RBC: 3.79 MIL/uL — ABNORMAL LOW (ref 4.22–5.81)
RDW: 19.4 % — ABNORMAL HIGH (ref 11.5–15.5)
WBC: 11 K/uL — ABNORMAL HIGH (ref 4.0–10.5)
nRBC: 0.2 % (ref 0.0–0.2)

## 2024-06-20 LAB — GLUCOSE, CAPILLARY: Glucose-Capillary: 103 mg/dL — ABNORMAL HIGH (ref 70–99)

## 2024-06-20 MED ORDER — CHLORHEXIDINE GLUCONATE CLOTH 2 % EX PADS: 6.0000 | MEDICATED_PAD | Freq: Every day | CUTANEOUS | Status: DC

## 2024-06-20 MED ORDER — CEPHALEXIN 500 MG PO CAPS
500.0000 mg | ORAL_CAPSULE | ORAL | Status: DC
  Administered 2024-06-20: 500 mg via ORAL
  Filled 2024-06-20 (×2): qty 1

## 2024-06-20 NOTE — Progress Notes (Signed)
 5 Days Post-Op Subjective: Resting in bed on rounds. At least allowed brief physical exam, but then refused.   Objective: Vital signs in last 24 hours: Temp:  [98 F (36.7 C)-98.6 F (37 C)] 98.6 F (37 C) (11/20 0428) Pulse Rate:  [71-86] 85 (11/20 0428) Resp:  [16-34] 18 (11/20 0428) BP: (93-152)/(37-89) 101/55 (11/20 0428) SpO2:  [94 %-97 %] 96 % (11/20 0428) Weight:  [54.7 kg-55.9 kg] 55.9 kg (11/19 1822)  Assessment/Plan: #penile calciphylaxis # bladder wall thickening  Followed by Dr. Lovie for penile calciphylaxis. Imaging confirms significant calcium  deposition. Dress with xeroform. Sodium Thiosulfate , nephrology following. No surgical indication, favor allowing autoamputation. Follow up in clinic 4 weeks.   Pt states himself to be more or less anuric. Distended bladder with wall thickening on CT imaging. UA with rare bacteria, but could represent simple cystitis or pyocystis. No drainage observed today. Now strongly suspecting what was observed yesterday draining purulent urine. Catheterization would facilitate drainage, if pt was agreeable, though unlikely. Daily Keflex  per primary team.        Intake/Output from previous day: 11/19 0701 - 11/20 0700 In: 100 [IV Piggyback:100] Out: 0   Intake/Output this shift: No intake/output data recorded.  Physical Exam:  General: Alert and oriented CV: No cyanosis Lungs: equal chest rise Gu: necrotic glans penis with some scrotal swelling, no erythema.   Lab Results: Recent Labs    06/18/24 0534 06/19/24 0449 06/20/24 0405  HGB 11.1* 11.4* 11.2*  HCT 36.0* 36.9* 36.5*   BMET Recent Labs    06/19/24 0449 06/20/24 0405  NA 139 138  K 4.3 3.9  CL 98 95*  CO2 24 25  GLUCOSE 120* 87  BUN 22 12  CREATININE 3.23* 2.08*  CALCIUM  8.1* 8.0*  HGB 11.4* 11.2*  WBC 10.3 11.0*     Studies/Results: CT PELVIS WO CONTRAST Result Date: 06/20/2024 EXAM: CT PELVIS, WITHOUT IV CONTRAST 06/19/2024 10:01:00 PM  TECHNIQUE: Axial images were acquired through the pelvis without IV contrast. Reformatted images were reviewed. Automated exposure control, iterative reconstruction, and/or weight based adjustment of the mA/kV was utilized to reduce the radiation dose to as low as reasonably achievable. COMPARISON: Prior examination of 09/29/2022. CLINICAL HISTORY: penile calciphylaxis FINDINGS: BONES: Osseous structures are age appropriate. JOINTS: No dislocation. The joint spaces are normal. SOFT TISSUES: There is development of diffuse body wall and retroperitoneal edema in keeping with anasarca. Advanced vascular calcifications are noted within the penis and keeping with the patient's given history. Large left fat-containing inguinal hernia again noted. INTRAPELVIC CONTENTS: Extensive severe visceral and peripheral arterial sclerosis noted. The prostate gland is unremarkable. There is circumferential bladder wall thickening which appears new from prior examination of 09/29/2022 and they reflect a diffuse infectious or inflammatory cystitis. Correlation with urinalysis and urine culture may be helpful for further management. There is circumferential rectal wall thickening and perirectal edema which may reflect a underlying proctitis. IMPRESSION: 1. Circumferential bladder wall thickening, new from prior examination, consistent with diffuse infectious or inflammatory cystitis. Correlation with urinalysis and urine culture may be helpful for further management. 2. Circumferential rectal wall thickening and perirectal edema, possibly reflecting underlying proctitis. 3. Large left fat-containing inguinal hernia, again noted. 4. Advanced arterial sclerosis. Advanced vascular calcifications within the penis, consistent with the patient's given history. Electronically signed by: Dorethia Molt MD 06/20/2024 12:11 AM EST RP Workstation: [id number]      LOS: 7 days   Ole Bourdon, NP [facility] Pager: [phone]  06/20/2024,  7:45 AM

## 2024-06-20 NOTE — [place] FL2 (Signed)
 [place]  MEDICAID FL2 LEVEL OF CARE FORM     IDENTIFICATION  Patient Name: [name] Birthdate: 1955-11-21 Sex: male Admission Date (Current Location): 06/13/2024  [facility] and [place] Number:  Producer, Television/film/video and Address:  [facility], [address], [place], [place] [address]      Provider Number: [id number]  Attending Physician Name and Address:  Leotis Bogus, MD  Relative Name and Phone Number:       Current Level of Care: Hospital Recommended Level of Care: Skilled Nursing Facility Prior Approval Number:    Date Approved/Denied:   PASRR Number: [id number]  Discharge Plan: SNF    Current Diagnoses: Patient Active Problem List   Diagnosis Date Noted   Gangrene of penis 06/14/2024   BPH (benign prostatic hyperplasia) 06/14/2024   Hyperlipemia    GI bleed 06/13/2024   Pressure injury of ankle, stage 2 (HCC) 08/28/2023   Septic shock (HCC) 08/24/2023   Encephalopathy acute 08/24/2023   Lactic acidosis 08/24/2023   Melena 07/21/2023   Heme positive stool 07/19/2023   Acute gastritis with hemorrhage 07/19/2023   Gastric and duodenal angiodysplasia with hemorrhage 07/19/2023   ABLA (acute blood loss anemia) 07/17/2023   GIB (gastrointestinal bleeding) 07/17/2023   NSTEMI (non-ST elevated myocardial infarction) (HCC) 10/05/2022   Ileus (HCC) 09/30/2022   Urinary retention 09/30/2022   Paralytic ileus (HCC) 09/30/2022   ESRD on hemodialysis (HCC) 09/30/2022   Chronic HFrEF (heart failure with reduced ejection fraction) (HCC) 09/30/2022   Hypotension 09/29/2022   Paroxysmal atrial fibrillation (HCC) 09/29/2022   DVT (deep venous thrombosis) (HCC) 09/24/2022   Hospital-acquired bacterial pneumonia 09/24/2022   Acute hypoxic respiratory failure (HCC) 09/23/2022   Secondary hyperparathyroidism of renal origin 02/09/2021   Fluid overload, unspecified 11/06/2020   COVID-19 08/10/2020   Iron  deficiency anemia, unspecified  01/10/2020   Anticoagulated 12/20/2019   Encounter for immunization 12/17/2019   Anaphylactic shock, unspecified, initial encounter 12/16/2019   Cardiomyopathy, unspecified (HCC) 12/16/2019   Heart failure, unspecified (HCC) 12/16/2019   Hyperparathyroidism, unspecified 12/16/2019   Metabolic disorder, unspecified 12/16/2019   Allergy, unspecified, initial encounter 12/16/2019   Other specified coagulation defects 12/16/2019   Pain, unspecified 12/16/2019   Acute systolic CHF (congestive heart failure) (HCC)    S/P dialysis catheter insertion    End stage renal disease on dialysis (HCC) 12/12/2019   PAD (peripheral artery disease) 12/12/2019   Pressure injury 12/01/2019   Anemia 11/29/2019   Uncontrolled hypertension 11/29/2019   CKD (chronic kidney disease), stage V (HCC) 11/29/2019   Severe protein-calorie malnutrition    Diabetic polyneuropathy associated with type 2 diabetes mellitus (HCC)    Venous insufficiency of right lower extremity    Pneumonia    Renal failure 11/28/2019   Toe gangrene (HCC) 12/18/2013   Gangrene of toe (HCC) 09/16/2013   Fever with chills 09/16/2013   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/2013   DKA (diabetic ketoacidosis) (HCC) 09/04/2013   Type 2 diabetes mellitus, controlled (HCC) 09/02/2013   DM (diabetes mellitus) type II controlled, neurological manifestation (HCC) 09/01/2013    Orientation RESPIRATION BLADDER Height & Weight     Self, Time, Situation, Place  Normal Incontinent Weight: 123 lb 3.8 oz (55.9 kg) Height:  5' 2 (157.5 cm)  BEHAVIORAL SYMPTOMS/MOOD NEUROLOGICAL BOWEL NUTRITION STATUS      Incontinent Diet (Please see discharge summary)  AMBULATORY STATUS COMMUNICATION OF NEEDS Skin     Verbally Other (Comment) (Abrasion,Buttocks,Penis,Erythema,Buttocks,Wound/Incision LDAs,Wound,sacrum,Wound PI,Penis,Medial,Wound,ankle,posterior,R,Wound,pretibial,Distal,L)  Personal Care Assistance Level of Assistance   Bathing, Feeding, Dressing Bathing Assistance: Limited assistance Feeding assistance: Independent Dressing Assistance: Limited assistance     Functional Limitations Info  Sight, Hearing, Speech Sight Info: Adequate Hearing Info: Adequate Speech Info: Adequate    SPECIAL CARE FACTORS FREQUENCY                       Contractures Contractures Info: Not present    Additional Factors Info  Code Status, Allergies Code Status Info: FULL Allergies Info: Ultram  (tramadol )           Current Medications (06/20/2024):  This is the current hospital active medication list Current Facility-Administered Medications  Medication Dose Route Frequency Provider Last Rate Last Admin   acetaminophen  (TYLENOL ) tablet 500 mg  500 mg Oral Q6H PRN Hall, Carole N, DO   500 mg at 06/15/24 2241   amiodarone  (PACERONE ) tablet 200 mg  200 mg Oral Daily Amin, Sumayya, MD   200 mg at 06/20/24 9057   atorvastatin  (LIPITOR) tablet 20 mg  20 mg Oral QHS Amin, Sumayya, MD   20 mg at 06/19/24 2048   cephALEXin (KEFLEX) capsule 500 mg  500 mg Oral Q24H Leotis Bogus, MD       [START ON 06/21/2024] Chlorhexidine  Gluconate Cloth 2 % PADS 6 each  6 each Topical Q0600 Jake Maisie Fellows, PA-C       feeding supplement (ENSURE PLUS HIGH PROTEIN) liquid 237 mL  237 mL Oral BID BM Amin, Sumayya, MD   237 mL at 06/16/24 1021   hydrocerin (EUCERIN) cream   Topical BID Amin, Sumayya, MD   Given at 06/19/24 9048   HYDROmorphone  (DILAUDID ) injection 0.5 mg  0.5 mg Intravenous Q4H PRN Shona Terry SAILOR, DO       liver oil-zinc oxide (DESITIN) 40 % ointment   Topical BID Amin, Sumayya, MD   Given at 06/20/24 0942   melatonin tablet 5 mg  5 mg Oral QHS PRN Shona Terry N, DO   5 mg at 06/19/24 2044   midodrine  (PROAMATINE ) tablet 5 mg  5 mg Oral TID WC Leotis Bogus, MD   5 mg at 06/20/24 1211   naloxone (NARCAN) injection 0.4 mg  0.4 mg Intravenous PRN Shona Terry SAILOR, DO       oxyCODONE  (Oxy IR/ROXICODONE ) immediate  release tablet 5-10 mg  5-10 mg Oral Q4H PRN Shona Terry N, DO   5 mg at 06/18/24 1754   pantoprazole  (PROTONIX ) injection 40 mg  40 mg Intravenous BID Shona Terry N, DO   40 mg at 06/20/24 0941   polyethylene glycol (MIRALAX  / GLYCOLAX ) packet 17 g  17 g Oral Daily PRN Shona Terry N, DO       prochlorperazine  (COMPAZINE ) injection 5 mg  5 mg Intravenous Q6H PRN Hall, Carole N, DO       [START ON 06/21/2024] sodium thiosulfate 25 g in sodium chloride  0.9 % 200 mL Infusion for Calciphylaxis  25 g Intravenous Q M,W,F-HD Geralynn Charleston, MD       tamsulosin  (FLOMAX ) capsule 0.4 mg  0.4 mg Oral Q M,W,F Amin, Sumayya, MD   0.4 mg at 06/19/24 9049     Discharge Medications: Please see discharge summary for a list of discharge medications.  Relevant Imaging Results:  Relevant Lab Results:   Additional Information [id number], out-pt HD at [facility], MWF, 10:25am chair time.  Isaiah Public, LCSWA

## 2024-06-20 NOTE — Progress Notes (Signed)
 [facility] Progress Note   Subjective:    Dialysis yesterday. Nursing reports he pulled needles during treatment.  He doesn't remember this. No complaints this am.  Seen by urology for penile calciphylaxis.   Objective Vitals:   06/19/24 1852 06/19/24 1947 06/20/24 0428 06/20/24 0811  BP: 105/77 (!) 102/54 (!) 101/55 (!) 105/57  Pulse: 84 79 85 92  Resp: 20 18 18 18   Temp: 98.4 F (36.9 C) 98.4 F (36.9 C) 98.6 F (37 C) 98.6 F (37 C)  TempSrc: Oral Oral Oral Oral  SpO2: 95% 96% 96% 91%  Weight:      Height:       Physical Exam General: alert male in NAD Heart: RRR, no murmurs Lungs: CTA bilaterally, respirations unlabored Abdomen: Soft, non-distended, +BS Extremities: No edema Dialysis Access: LUE AVF + t/b  Additional Objective Labs: Basic Metabolic Panel: Recent Labs  Lab 06/14/24 0330 06/14/24 2359 06/17/24 0546 06/18/24 0534 06/19/24 0449 06/20/24 0405  NA 144   < > 141 137 139 138  K 3.0*   < > 4.6 3.7 4.3 3.9  CL 97*   < > 100 99 98 95*  CO2 28   < > 23 22 24 25   GLUCOSE 101*   < > 83 52* 120* 87  BUN 17   < > 29* 14 22 12   CREATININE 2.86*   < > 3.73* 2.32* 3.23* 2.08*  CALCIUM  8.0*   < > 8.4* 7.9* 8.1* 8.0*  PHOS 3.5  --  4.0  --   --   --    < > = values in this interval not displayed.   Liver Function Tests: Recent Labs  Lab 06/13/24 1605 06/14/24 0330 06/14/24 2359  AST 17  --  13*  ALT 11  --  8  ALKPHOS 92  --  68  BILITOT 0.6  --  1.3*  PROT 6.1*  --  5.7*  ALBUMIN  2.0* 1.9* 2.3*   No results for input(s): LIPASE, AMYLASE in the last 168 hours. CBC: Recent Labs  Lab 06/14/24 0330 06/14/24 1216 06/16/24 0513 06/17/24 0546 06/18/24 0534 06/19/24 0449 06/20/24 0405  WBC 13.7*   < > 12.7* 12.6* 11.7* 10.3 11.0*  NEUTROABS 12.1*  --   --   --  10.3*  --   --   HGB 9.6*   < > 10.8* 11.3* 11.1* 11.4* 11.2*  HCT 30.9*   < > 34.3* 36.3* 36.0* 36.9* 36.5*  MCV 98.4   < > 96.1 97.3 98.4 97.1 96.3  PLT 242    < > 205 210 178 165 161   < > = values in this interval not displayed.   Blood Culture    Component Value Date/Time   SDES BLOOD BLOOD RIGHT HAND 06/13/2024 2257   SPECREQUEST  06/13/2024 2257    BOTTLES DRAWN AEROBIC AND ANAEROBIC Blood Culture results may not be optimal due to an inadequate volume of blood received in culture bottles   CULT (A) 06/13/2024 2257    STAPHYLOCOCCUS HOMINIS THE SIGNIFICANCE OF ISOLATING THIS ORGANISM FROM A SINGLE SET OF BLOOD CULTURES WHEN MULTIPLE SETS ARE DRAWN IS UNCERTAIN. PLEASE NOTIFY THE MICROBIOLOGY DEPARTMENT WITHIN ONE WEEK IF SPECIATION AND SENSITIVITIES ARE REQUIRED. Performed at [facility], [address], [place], [place] [address]    REPTSTATUS 06/16/2024 FINAL 06/13/2024 2257    Cardiac Enzymes: No results for input(s): CKTOTAL, CKMB, CKMBINDEX, TROPONINI in the last 168 hours. CBG: Recent Labs  Lab  06/18/24 0816 06/18/24 0848 06/18/24 0928 06/19/24 0748 06/20/24 0003  GLUCAP 50* 49* 75 118* 103*   Iron  Studies: No results for input(s): IRON , TIBC, TRANSFERRIN, FERRITIN in the last 72 hours. @lablastinr3 @ Studies/Results: CT PELVIS WO CONTRAST Result Date: 06/20/2024 EXAM: CT PELVIS, WITHOUT IV CONTRAST 06/19/2024 10:01:00 PM TECHNIQUE: Axial images were acquired through the pelvis without IV contrast. Reformatted images were reviewed. Automated exposure control, iterative reconstruction, and/or weight based adjustment of the mA/kV was utilized to reduce the radiation dose to as low as reasonably achievable. COMPARISON: Prior examination of 09/29/2022. CLINICAL HISTORY: penile calciphylaxis FINDINGS: BONES: Osseous structures are age appropriate. JOINTS: No dislocation. The joint spaces are normal. SOFT TISSUES: There is development of diffuse body wall and retroperitoneal edema in keeping with anasarca. Advanced vascular calcifications are noted within the penis and keeping with the patient's given history.  Large left fat-containing inguinal hernia again noted. INTRAPELVIC CONTENTS: Extensive severe visceral and peripheral arterial sclerosis noted. The prostate gland is unremarkable. There is circumferential bladder wall thickening which appears new from prior examination of 09/29/2022 and they reflect a diffuse infectious or inflammatory cystitis. Correlation with urinalysis and urine culture may be helpful for further management. There is circumferential rectal wall thickening and perirectal edema which may reflect a underlying proctitis. IMPRESSION: 1. Circumferential bladder wall thickening, new from prior examination, consistent with diffuse infectious or inflammatory cystitis. Correlation with urinalysis and urine culture may be helpful for further management. 2. Circumferential rectal wall thickening and perirectal edema, possibly reflecting underlying proctitis. 3. Large left fat-containing inguinal hernia, again noted. 4. Advanced arterial sclerosis. Advanced vascular calcifications within the penis, consistent with the patient's given history. Electronically signed by: Dorethia Molt MD 06/20/2024 12:11 AM EST RP Workstation: [id number]   Medications:  [START ON 06/21/2024] sodium thiosulfate 25 g in sodium chloride  0.9 % 200 mL Infusion for Calciphylaxis      amiodarone   200 mg Oral Daily   atorvastatin   20 mg Oral QHS   cephALEXin  500 mg Oral Q24H   feeding supplement  237 mL Oral BID BM   hydrocerin   Topical BID   liver oil-zinc oxide   Topical BID   midodrine   5 mg Oral TID WC   pantoprazole  (PROTONIX ) IV  40 mg Intravenous BID   tamsulosin   0.4 mg Oral Q M,W,F    Dialysis Orders:  [facility] MWF 3h B400  52.3kg  AVF  Heparin  none Last OP HD 11/12, post wt 51.3kg Mircera 225 mcg q 2wks, last 11/12, due 11/26 Old Hb's: 8.0 on 11/12, 9.0 on 11.06, 8.9 on 10/29  Assessment/Plan: GIB/Melena. H/o AVMs. Received 2 u prbcs. S/p EGD w/o acute findings. Elqiuis on hold.  ESRD. on HD MWF.  Continue per usual schedule.  HTN.  BP soft. On midodrine  5 mg Tid.  Volume.  Anasarca noted on CT. Push UF with next HD.  Anemia. Hb 11s s/p prbcs. No ESA indication at present. HFrEF. Optimze volume with HD PAFib. Eliquis /metoprolol  on hold  BMM. No active VDRA orders. Ca/Phos at goal. 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   Maisie Fellows Xiadani Damman PA-C 06/20/2024,9:26 AM

## 2024-06-20 NOTE — Progress Notes (Signed)
 PROGRESS NOTE    [name]  FMW:[id number] DOB: 02/01/1956 DOA: 06/13/2024 PCP: Patient, No Pcp Per    Brief Narrative:  This 68 y.o. 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  His vitals were stable on arrival but noted to have large amount of melena in his diaper.  Hemoglobin 8.3 from baseline of 9 positive FOBT.  Patient was admitted and started on IV PPIs, GI was consulted,  Patient was given 1 unit of PRBC. Patient underwent EGD which was unremarkable.  Patient declined to have colonoscopy. H&H remained stable.  Patient has been refusing colonoscopy.  Patient is getting hemodialysis.  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 Hemoglobin 8.8 on admission  s/p 2 unit of PRBC Hb improved to 11.1 EGD this admission with no obvious bleeding. Colonoscopy was also discussed but patient declined. H/H has been improving.  Hb 10.8 > 11.3 . 11.4 Continue with IV Protonix  Keep holding Eliquis . will discuss with GI about resumption.  Paroxysmal atrial fibrillation (HCC) Currently in sinus rhythm. HR controlled. Continuing home  amiodarone . Holding metoprolol  due to softer blood pressure Holding home Eliquis  due to GI bleed.   Chronic HFrEF (heart failure with reduced ejection fraction) (HCC) Prior echo done on 08/20/2022 with EF of 40 to 45%.  Clinically appears euvolemic. Volume is being managed with dialysis.   Gangrene of penis: Concern of superadded cellulitis: Recently diagnosed with gangrene of the tip of penis at urology office. Started on broad-spectrum antibiotics with cefepime  and vancomycin  for concern of superadded cellulitis. Continue with supportive care 11/16, blood culture growing GPC, staphylococcus hominis. Discussed with pharmacist antibiotics changed to ceftriaxone . Urology is consulted for significantly swollen scrotum with gangrenous tip of penis. Urology recommended No surgical indication, favor allowing autoamputation. Follow up in clinic 4 weeks.  Continue Keflex daily.   Hypotension: BP was soft initially. He was continued midodrine  5 mg p.o. 3 times daily Holding metoprolol   ESRD on hemodialysis [facility]): Hemodialysis MWF schedule Nephrology following, next HD will be today.   Hyperlipemia: Continue with home statin.   Pressure injury: Patient was found to have multiple unstageable pressure injuries, POA. Involving buttock, ischium and heel. -Wound care was consulted.   BPH (benign prostatic hyperplasia) Continue Flomax .   DVT prophylaxis: SCDs Code Status:Full code Family Communication: No family at bed side Disposition Plan:    Status is: Inpatient Remains inpatient appropriate because: Anticipated discharge back to SNF in 1-2 days. Blood pressures running low.   Consultants:  Nephrology Gastroenterology  Procedures: EGD  Antimicrobials:  Anti-infectives (From admission, onward)    Start     Dose/Rate Route Frequency Ordered Stop  06/20/24 2200  cephALEXin  (KEFLEX ) capsule 500 mg        500 mg Oral Every 24 hours 06/20/24 0825     06/18/24 2000   cefTRIAXone  (ROCEPHIN ) 2 g in sodium chloride  0.9 % 100 mL IVPB  Status:  Discontinued        2 g 200 mL/hr over 30 Minutes Intravenous Every 24 hours 06/18/24 1249 06/20/24 0825   06/17/24 1600  vancomycin  (VANCOREADY) IVPB 500 mg/100 mL  Status:  Discontinued        500 mg 100 mL/hr over 60 Minutes Intravenous Every M-W-F (Hemodialysis) 06/16/24 0812 06/18/24 1249   06/14/24 1800  vancomycin  (VANCOREADY) IVPB 500 mg/100 mL        500 mg 100 mL/hr over 60 Minutes Intravenous  Once 06/14/24 1311 06/14/24 1916   06/13/24 2300  vancomycin  (VANCOREADY) IVPB 1250 mg/250 mL        1,250 mg 166.7 mL/hr over 90 Minutes Intravenous  Once 06/13/24 2250 06/14/24 0557   06/13/24 2300  ceFEPIme  (MAXIPIME ) 1 g in sodium chloride  0.9 % 100 mL IVPB  Status:  Discontinued        1 g 200 mL/hr over 30 Minutes Intravenous Every 24 hours 06/13/24 2250 06/18/24 1249      Subjective: Patient was seen and examined at bedside.  Overnight events noted. Patient reports feeling better and states he is feeling discomfort in the penis because of significant swelling. Denies any further bleeding, His BP has been running low.  Objective: Vitals:   06/19/24 1947 06/20/24 0428 06/20/24 0811 06/20/24 1150  BP: (!) 102/54 (!) 101/55 (!) 105/57 (!) 81/53  Pulse: 79 85 92 81  Resp: 18 18 18  (!) 24  Temp: 98.4 F (36.9 C) 98.6 F (37 C) 98.6 F (37 C) 98.9 F (37.2 C)  TempSrc: Oral Oral Oral Oral  SpO2: 96% 96% 91% (!) 88%  Weight:      Height:        Intake/Output Summary (Last 24 hours) at 06/20/2024 1332 Last data filed at 06/20/2024 0430 Gross per 24 hour  Intake 0 ml  Output 0 ml  Net 0 ml   Filed Weights   06/17/24 1719 06/19/24 1356 06/19/24 1822  Weight: 53.1 kg 54.7 kg 55.9 kg    Examination:  General exam: Appears calm and comfortable, deconditioned, not in any acute distress. Respiratory system: CTA Bilaterally . Respiratory effort normal.  RR 16 Cardiovascular system: S1 & S2 heard,  RRR. No JVD, murmurs, rubs, gallops or clicks.  Gastrointestinal system: Abdomen is non distended, soft and non tender. Normal bowel sounds heard. Central nervous system: Alert and oriented X 3. No focal neurological deficits. Genito urinary: Difficulty scrotal swelling noted with blackening of the tip of the penis. Extremities: No Edema, no cyanosis, no clubbing. Skin: No rashes, lesions or ulcers Psychiatry: Judgement and insight appear normal. Mood & affect appropriate.    Data Reviewed: I have personally reviewed following labs and imaging studies  CBC: Recent Labs  Lab 06/14/24 0330 06/14/24 1216 06/16/24 0513 06/17/24 0546 06/18/24 0534 06/19/24 0449 06/20/24 0405  WBC 13.7*   < > 12.7* 12.6* 11.7* 10.3 11.0*  NEUTROABS 12.1*  --   --   --  10.3*  --   --   HGB 9.6*   < > 10.8* 11.3* 11.1* 11.4* 11.2*  HCT 30.9*   < > 34.3* 36.3* 36.0* 36.9* 36.5*  MCV 98.4   < > 96.1 97.3 98.4 97.1 96.3  PLT 242   < >  205 210 178 165 161   < > = values in this interval not displayed.   Basic Metabolic Panel: Recent Labs  Lab 06/14/24 0330 06/14/24 2359 06/17/24 0546 06/18/24 0534 06/19/24 0449 06/20/24 0405  NA 144 140 141 137 139 138  K 3.0* 3.8 4.6 3.7 4.3 3.9  CL 97* 99 100 99 98 95*  CO2 28 27 23 22 24 25   GLUCOSE 101* 71 83 52* 120* 87  BUN 17 9 29* 14 22 12   CREATININE 2.86* 1.71* 3.73* 2.32* 3.23* 2.08*  CALCIUM  8.0* 7.6* 8.4* 7.9* 8.1* 8.0*  MG  --   --  1.8  --   --   --   PHOS 3.5  --  4.0  --   --   --    GFR: Estimated Creatinine Clearance: 27 mL/min (A) (by C-G formula based on SCr of 2.08 mg/dL (H)). Liver Function Tests: Recent Labs  Lab 06/13/24 1605 06/14/24 0330 06/14/24 2359  AST 17  --  13*  ALT 11  --  8  ALKPHOS 92  --  68  BILITOT 0.6  --  1.3*  PROT 6.1*  --  5.7*  ALBUMIN  2.0* 1.9* 2.3*   No results for input(s): LIPASE, AMYLASE in the last 168 hours. No results for input(s): AMMONIA in the last 168 hours. Coagulation Profile: No  results for input(s): INR, PROTIME in the last 168 hours. Cardiac Enzymes: No results for input(s): CKTOTAL, CKMB, CKMBINDEX, TROPONINI in the last 168 hours. BNP (last 3 results) No results for input(s): PROBNP in the last 8760 hours. HbA1C: No results for input(s): HGBA1C in the last 72 hours. CBG: Recent Labs  Lab 06/18/24 0816 06/18/24 0848 06/18/24 0928 06/19/24 0748 06/20/24 0003  GLUCAP 50* 49* 75 118* 103*   Lipid Profile: No results for input(s): CHOL, HDL, LDLCALC, TRIG, CHOLHDL, LDLDIRECT in the last 72 hours. Thyroid Function Tests: No results for input(s): TSH, T4TOTAL, FREET4, T3FREE, THYROIDAB in the last 72 hours. Anemia Panel: No results for input(s): VITAMINB12, FOLATE, FERRITIN, TIBC, IRON , RETICCTPCT in the last 72 hours. Sepsis Labs: Recent Labs  Lab 06/14/24 2359  LATICACIDVEN 0.9    Recent Results (from the past 240 hours)  Culture, blood (Routine X 2) w Reflex to ID Panel     Status: None   Collection Time: 06/13/24 10:52 PM   Specimen: BLOOD  Result Value Ref Range Status   Specimen Description BLOOD RIGHT ANTECUBITAL  Final   Special Requests   Final    BOTTLES DRAWN AEROBIC AND ANAEROBIC Blood Culture adequate volume   Culture   Final    NO GROWTH 5 DAYS Performed at [facility], [address], [place], [place] [address]    Report Status 06/19/2024 FINAL  Final  Culture, blood (Routine X 2) w Reflex to ID Panel     Status: Abnormal   Collection Time: 06/13/24 10:57 PM   Specimen: BLOOD  Result Value Ref Range Status   Specimen Description BLOOD BLOOD RIGHT HAND  Final   Special Requests   Final    BOTTLES DRAWN AEROBIC AND ANAEROBIC Blood Culture results may not be optimal due to an inadequate volume of blood received in culture bottles   Culture  Setup Time   Final    GRAM POSITIVE COCCI AEROBIC BOTTLE ONLY CRITICAL RESULT CALLED TO, READ BACK BY AND VERIFIED WITH: PHARMD GREG  ABBOTT [phone] [id number] BY J RAZZAK, MT    Culture (A)  Final  STAPHYLOCOCCUS HOMINIS THE SIGNIFICANCE OF ISOLATING THIS ORGANISM FROM A SINGLE SET OF BLOOD CULTURES WHEN MULTIPLE SETS ARE DRAWN IS UNCERTAIN. PLEASE NOTIFY THE MICROBIOLOGY DEPARTMENT WITHIN ONE WEEK IF SPECIATION AND SENSITIVITIES ARE REQUIRED. Performed at [facility], [address], [place], [place] [address]    Report Status 06/16/2024 FINAL  Final  Blood Culture ID Panel (Reflexed)     Status: Abnormal   Collection Time: 06/13/24 10:57 PM  Result Value Ref Range Status   Enterococcus faecalis NOT DETECTED NOT DETECTED Final   Enterococcus Faecium NOT DETECTED NOT DETECTED Final   Listeria monocytogenes NOT DETECTED NOT DETECTED Final   Staphylococcus species DETECTED (A) NOT DETECTED Final    Comment: CRITICAL RESULT CALLED TO, READ BACK BY AND VERIFIED WITH: PHARMD GREG ABBOTT [phone] BY J RAZZAK, MT    Staphylococcus aureus (BCID) NOT DETECTED NOT DETECTED Final   Staphylococcus epidermidis NOT DETECTED NOT DETECTED Final   Staphylococcus lugdunensis NOT DETECTED NOT DETECTED Final   Streptococcus species NOT DETECTED NOT DETECTED Final   Streptococcus agalactiae NOT DETECTED NOT DETECTED Final   Streptococcus pneumoniae NOT DETECTED NOT DETECTED Final   Streptococcus pyogenes NOT DETECTED NOT DETECTED Final   A.calcoaceticus-baumannii NOT DETECTED NOT DETECTED Final   Bacteroides fragilis NOT DETECTED NOT DETECTED Final   Enterobacterales NOT DETECTED NOT DETECTED Final   Enterobacter cloacae complex NOT DETECTED NOT DETECTED Final   Escherichia coli NOT DETECTED NOT DETECTED Final   Klebsiella aerogenes NOT DETECTED NOT DETECTED Final   Klebsiella oxytoca NOT DETECTED NOT DETECTED Final   Klebsiella pneumoniae NOT DETECTED NOT DETECTED Final   Proteus species NOT DETECTED NOT DETECTED Final   Salmonella species NOT DETECTED NOT DETECTED Final   Serratia marcescens NOT DETECTED NOT DETECTED  Final   Haemophilus influenzae NOT DETECTED NOT DETECTED Final   Neisseria meningitidis NOT DETECTED NOT DETECTED Final   Pseudomonas aeruginosa NOT DETECTED NOT DETECTED Final   Stenotrophomonas maltophilia NOT DETECTED NOT DETECTED Final   Candida albicans NOT DETECTED NOT DETECTED Final   Candida auris NOT DETECTED NOT DETECTED Final   Candida glabrata NOT DETECTED NOT DETECTED Final   Candida krusei NOT DETECTED NOT DETECTED Final   Candida parapsilosis NOT DETECTED NOT DETECTED Final   Candida tropicalis NOT DETECTED NOT DETECTED Final   Cryptococcus neoformans/gattii NOT DETECTED NOT DETECTED Final    Comment: Performed at [facility], [address], [place], [place] [address]  MRSA Next Gen by PCR, Nasal     Status: None   Collection Time: 06/14/24  6:26 AM   Specimen: Nasal Mucosa; Nasal Swab  Result Value Ref Range Status   MRSA by PCR Next Gen NOT DETECTED NOT DETECTED Final    Comment: (NOTE) The GeneXpert MRSA Assay (FDA approved for NASAL specimens only), is one component of a comprehensive MRSA colonization surveillance program. It is not intended to diagnose MRSA infection nor to guide or monitor treatment for MRSA infections. Test performance is not FDA approved in patients less than 28 years old. Performed at [facility], [address] [place], [place] [address]     Radiology Studies: CT PELVIS WO CONTRAST Result Date: 06/20/2024 EXAM: CT PELVIS, WITHOUT IV CONTRAST 06/19/2024 10:01:00 PM TECHNIQUE: Axial images were acquired through the pelvis without IV contrast. Reformatted images were reviewed. Automated exposure control, iterative reconstruction, and/or weight based adjustment of the mA/kV was utilized to reduce the radiation dose to as low as reasonably achievable. COMPARISON: Prior  examination of 09/29/2022. CLINICAL HISTORY: penile calciphylaxis FINDINGS: BONES: Osseous structures are age appropriate. JOINTS: No dislocation. The joint spaces  are normal. SOFT TISSUES: There is development of diffuse body wall and retroperitoneal edema in keeping with anasarca. Advanced vascular calcifications are noted within the penis and keeping with the patient's given history. Large left fat-containing inguinal hernia again noted. INTRAPELVIC CONTENTS: Extensive severe visceral and peripheral arterial sclerosis noted. The prostate gland is unremarkable. There is circumferential bladder wall thickening which appears new from prior examination of 09/29/2022 and they reflect a diffuse infectious or inflammatory cystitis. Correlation with urinalysis and urine culture may be helpful for further management. There is circumferential rectal wall thickening and perirectal edema which may reflect a underlying proctitis. IMPRESSION: 1. Circumferential bladder wall thickening, new from prior examination, consistent with diffuse infectious or inflammatory cystitis. Correlation with urinalysis and urine culture may be helpful for further management. 2. Circumferential rectal wall thickening and perirectal edema, possibly reflecting underlying proctitis. 3. Large left fat-containing inguinal hernia, again noted. 4. Advanced arterial sclerosis. Advanced vascular calcifications within the penis, consistent with the patient's given history. Electronically signed by: Dorethia Molt MD 06/20/2024 12:11 AM EST RP Workstation: [id number]    Scheduled Meds:  amiodarone   200 mg Oral Daily   atorvastatin   20 mg Oral QHS   cephALEXin  500 mg Oral Q24H   [START ON 06/21/2024] Chlorhexidine  Gluconate Cloth  6 each Topical Q0600   feeding supplement  237 mL Oral BID BM   hydrocerin   Topical BID   liver oil-zinc oxide   Topical BID   midodrine   5 mg Oral TID WC   pantoprazole  (PROTONIX ) IV  40 mg Intravenous BID   tamsulosin   0.4 mg Oral Q M,W,F   Continuous Infusions:  [START ON 06/21/2024] sodium thiosulfate 25 g in sodium chloride  0.9 % 200 mL Infusion for Calciphylaxis        LOS: 7 days    Time spent: 35 mins    Darcel Dawley, MD [facility]   If 7PM-7AM, please contact night-coverage

## 2024-06-20 NOTE — TOC Progression Note (Signed)
 Transition of Care [facility]) - Progression Note    Patient Details  Name: [name] MRN: [id number] Date of Birth: 06/23/1956  Transition of Care [facility]) CM/SW Contact  Isaiah Public, LCSWA Phone Number: 06/20/2024, 2:08 PM  Clinical Narrative:     Patient is from [facility]. CSW will continue to follow and assist with patients dc planning needs.  Expected Discharge Plan: Long Term Nursing Home Barriers to Discharge: Continued Medical Work up               Expected Discharge Plan and Services In-house Referral: Clinical Social Work   Post Acute Care Choice: Skilled Nursing Facility Living arrangements for the past 2 months: Skilled Nursing Facility                                       Social Drivers of Health (SDOH) Interventions SDOH Screenings   Food Insecurity: Patient Declined (06/15/2024)  Housing: Unknown (06/15/2024)  Transportation Needs: Patient Declined (06/15/2024)  Utilities: Patient Declined (06/15/2024)  Social Connections: Patient Declined (06/15/2024)  Tobacco Use: Low Risk  (06/13/2024)    Readmission Risk Interventions     No data to display

## 2024-06-20 NOTE — Plan of Care (Signed)
  Problem: Clinical Measurements: Goal: Ability to avoid or minimize complications of infection will improve Outcome: Progressing   Problem: Skin Integrity: Goal: Skin integrity will improve Outcome: Progressing   Problem: Education: Goal: Knowledge of General Education information will improve Description: Including pain rating scale, medication(s)/side effects and non-pharmacologic comfort measures Outcome: Progressing   Problem: Health Behavior/Discharge Planning: Goal: Ability to manage health-related needs will improve Outcome: Progressing   Problem: Clinical Measurements: Goal: Ability to maintain clinical measurements within normal limits will improve Outcome: Progressing Goal: Will remain free from infection Outcome: Progressing Goal: Diagnostic test results will improve Outcome: Progressing Goal: Respiratory complications will improve Outcome: Progressing Goal: Cardiovascular complication will be avoided Outcome: Progressing   Problem: Activity: Goal: Risk for activity intolerance will decrease Outcome: Progressing   Problem: Nutrition: Goal: Adequate nutrition will be maintained Outcome: Progressing   Problem: Coping: Goal: Level of anxiety will decrease Outcome: Progressing   Problem: Elimination: Goal: Will not experience complications related to bowel motility Outcome: Progressing Goal: Will not experience complications related to urinary retention Outcome: Progressing   Problem: Pain Managment: Goal: General experience of comfort will improve and/or be controlled Outcome: Progressing   Problem: Safety: Goal: Ability to remain free from injury will improve Outcome: Progressing   Problem: Skin Integrity: Goal: Risk for impaired skin integrity will decrease Outcome: Progressing

## 2024-06-21 DIAGNOSIS — K922 Gastrointestinal hemorrhage, unspecified: Secondary | ICD-10-CM | POA: Diagnosis not present

## 2024-06-21 LAB — BASIC METABOLIC PANEL WITH GFR
Anion gap: 16 — ABNORMAL HIGH (ref 5–15)
BUN: 21 mg/dL (ref 8–23)
CO2: 24 mmol/L (ref 22–32)
Calcium: 8 mg/dL — ABNORMAL LOW (ref 8.9–10.3)
Chloride: 97 mmol/L — ABNORMAL LOW (ref 98–111)
Creatinine, Ser: 2.83 mg/dL — ABNORMAL HIGH (ref 0.61–1.24)
GFR, Estimated: 24 mL/min — ABNORMAL LOW (ref >60)
Glucose, Bld: 116 mg/dL — ABNORMAL HIGH (ref 70–99)
Potassium: 4.8 mmol/L (ref 3.5–5.1)
Sodium: 137 mmol/L (ref 135–145)

## 2024-06-21 LAB — PHOSPHORUS: Phosphorus: 3.2 mg/dL (ref 2.5–4.6)

## 2024-06-21 LAB — CBC
HCT: 39.6 % (ref 39.0–52.0)
Hemoglobin: 12 g/dL — ABNORMAL LOW (ref 13.0–17.0)
MCH: 29.7 pg (ref 26.0–34.0)
MCHC: 30.3 g/dL (ref 30.0–36.0)
MCV: 98 fL (ref 80.0–100.0)
Platelets: 135 K/uL — ABNORMAL LOW (ref 150–400)
RBC: 4.04 MIL/uL — ABNORMAL LOW (ref 4.22–5.81)
RDW: 19.3 % — ABNORMAL HIGH (ref 11.5–15.5)
WBC: 10.3 K/uL (ref 4.0–10.5)
nRBC: 0 % (ref 0.0–0.2)

## 2024-06-21 MED ORDER — OXYCODONE HCL 5 MG PO TABS: 5.0000 mg | ORAL_TABLET | ORAL | 0 refills | Status: DC | PRN

## 2024-06-21 MED ORDER — CEPHALEXIN 500 MG PO CAPS: 500.0000 mg | ORAL_CAPSULE | Freq: Two times a day (BID) | ORAL | 0 refills | Status: AC

## 2024-06-21 MED ORDER — ANTICOAGULANT SODIUM CITRATE 4% (200MG/5ML) IV SOLN: 5.0000 mL | Status: DC | PRN

## 2024-06-21 MED ORDER — LIDOCAINE HCL (PF) 1 % IJ SOLN: 5.0000 mL | INTRAMUSCULAR | Status: DC | PRN

## 2024-06-21 MED ORDER — OXYCODONE HCL 5 MG PO TABS: 5.0000 mg | ORAL_TABLET | ORAL | 0 refills | Status: AC | PRN

## 2024-06-21 MED ORDER — ALTEPLASE 2 MG IJ SOLR: 2.0000 mg | Freq: Once | INTRAMUSCULAR | Status: DC | PRN

## 2024-06-21 MED ORDER — MIDODRINE HCL 5 MG PO TABS: 5.0000 mg | ORAL_TABLET | Freq: Three times a day (TID) | ORAL | 0 refills | Status: DC

## 2024-06-21 MED ORDER — PENTAFLUOROPROP-TETRAFLUOROETH EX AERO: 1.0000 | INHALATION_SPRAY | CUTANEOUS | Status: DC | PRN

## 2024-06-21 MED ORDER — LIDOCAINE-PRILOCAINE 2.5-2.5 % EX CREA: 1.0000 | TOPICAL_CREAM | CUTANEOUS | Status: DC | PRN

## 2024-06-21 MED ORDER — HEPARIN SODIUM (PORCINE) 1000 UNIT/ML DIALYSIS: 1000.0000 [IU] | INTRAMUSCULAR | Status: DC | PRN

## 2024-06-21 NOTE — TOC Progression Note (Addendum)
 Transition of Care [facility]) - Progression Note    Patient Details  Name: [name] MRN: [id number] Date of Birth: 1956/05/23  Transition of Care [facility]) CM/SW Contact  Isaiah Public, LCSWA Phone Number: 06/21/2024, 12:01 PM  Clinical Narrative:     Leontine with Kathlean Milian confirmed patient can return back to facility today if medically ready. CSW informed MD.  Update- Patient request to speak with Chaplain about HCPOA paperwork. CSW paged Chaplain. Leeroy Kerry called CSW back and CSW informed Kerry Leeroy. Leeroy informed CSW she will go see patient. CSW informed RN.  Expected Discharge Plan: Long Term Nursing Home Barriers to Discharge: Continued Medical Work up               Expected Discharge Plan and Services In-house Referral: Clinical Social Work   Post Acute Care Choice: Skilled Nursing Facility Living arrangements for the past 2 months: Skilled Nursing Facility                                       Social Drivers of Health (SDOH) Interventions SDOH Screenings   Food Insecurity: Patient Declined (06/15/2024)  Housing: Unknown (06/15/2024)  Transportation Needs: Patient Declined (06/15/2024)  Utilities: Patient Declined (06/15/2024)  Social Connections: Patient Declined (06/15/2024)  Tobacco Use: Low Risk  (06/13/2024)    Readmission Risk Interventions     No data to display

## 2024-06-21 NOTE — Plan of Care (Signed)
 [facility] Patient Discharge Orders- [facility]IC: G[facility]  Patient's name: [name] Admit/DC Dates: 06/13/2024 - 06/21/2024  Discharge Diagnoses: GIB s/p transfusion PAFib. Eliquis  resumed Penile calciphylaxis. Na thiosulfate started   Outpatient Dialysis Orders:  -Heparin : None  -EDW 52 kg  (anasarca noted here, consider further lowering)   -[place]: None   Anemia Aranesp : Given: --    Date of last dose/amount: --    PRBC's Given: Y  Date/# of units: 11/13  2 U  ESA dose for discharge: per protocol   Recent Labs  Lab 06/14/24 2359 06/15/24 0406 06/21/24 0411  HGB 8.8*   < > 12.0*  K 3.8   < > 4.8  CALCIUM  7.6*   < > 8.0*  PHOS  --    < > 3.2  ALBUMIN  2.3*  --   --    < > = values in this interval not displayed.   Access intervention/Change: none    Medications: -Sodium thiosulfate  25g IV q HD for calciphylaxis   OTHER/APPTS/LABS   Completed by: Maisie Ronnald Acosta PA-C   D/C Meds to be reconciled by nurse after every discharge.    Reviewed by: MD:______ RN_______

## 2024-06-21 NOTE — Progress Notes (Addendum)
 [facility] Progress Note   Subjective:    Seen in [facility]. On dialysis. Calm. No complaints this am  Objective Vitals:   06/21/24 0601 06/21/24 0828 06/21/24 0830 06/21/24 0900  BP: 101/83 (!) 135/56 (!) 128/53 119/61  Pulse: 80 77 75 71  Resp: 18 (!) 22 (!) 27 17  Temp: 98.6 F (37 C) 98.7 F (37.1 C)    TempSrc: Oral     SpO2: 94% 94% 96% 95%  Weight:      Height:       Physical Exam General: alert male in NAD Heart: RRR, no murmurs Lungs:Clear, respirations unlabored Abdomen: Soft, non tender  Extremities: No LE edema + hip edema  Dialysis Access: LUE AVF + t/b  Additional Objective Labs: Basic Metabolic Panel: Recent Labs  Lab 06/17/24 0546 06/18/24 0534 06/19/24 0449 06/20/24 0405 06/21/24 0411  NA 141   < > 139 138 137  K 4.6   < > 4.3 3.9 4.8  CL 100   < > 98 95* 97*  CO2 23   < > 24 25 24   GLUCOSE 83   < > 120* 87 116*  BUN 29*   < > 22 12 21   CREATININE 3.73*   < > 3.23* 2.08* 2.83*  CALCIUM  8.4*   < > 8.1* 8.0* 8.0*  PHOS 4.0  --   --   --  3.2   < > = values in this interval not displayed.   Liver Function Tests: Recent Labs  Lab 06/14/24 2359  AST 13*  ALT 8  ALKPHOS 68  BILITOT 1.3*  PROT 5.7*  ALBUMIN  2.3*   No results for input(s): LIPASE, AMYLASE in the last 168 hours. CBC: Recent Labs  Lab 06/17/24 0546 06/18/24 0534 06/19/24 0449 06/20/24 0405 06/21/24 0411  WBC 12.6* 11.7* 10.3 11.0* 10.3  NEUTROABS  --  10.3*  --   --   --   HGB 11.3* 11.1* 11.4* 11.2* 12.0*  HCT 36.3* 36.0* 36.9* 36.5* 39.6  MCV 97.3 98.4 97.1 96.3 98.0  PLT 210 178 165 161 135*   Blood Culture    Component Value Date/Time   SDES BLOOD BLOOD RIGHT HAND 06/13/2024 2257   SPECREQUEST  06/13/2024 2257    BOTTLES DRAWN AEROBIC AND ANAEROBIC Blood Culture results may not be optimal due to an inadequate volume of blood received in culture bottles   CULT (A) 06/13/2024 2257    STAPHYLOCOCCUS HOMINIS THE SIGNIFICANCE OF ISOLATING THIS  ORGANISM FROM A SINGLE SET OF BLOOD CULTURES WHEN MULTIPLE SETS ARE DRAWN IS UNCERTAIN. PLEASE NOTIFY THE MICROBIOLOGY DEPARTMENT WITHIN ONE WEEK IF SPECIATION AND SENSITIVITIES ARE REQUIRED. Performed at [facility], [address] [place], [place] [address]    REPTSTATUS 06/16/2024 FINAL 06/13/2024 2257    Cardiac Enzymes: No results for input(s): CKTOTAL, CKMB, CKMBINDEX, TROPONINI in the last 168 hours. CBG: Recent Labs  Lab 06/18/24 0816 06/18/24 0848 06/18/24 0928 06/19/24 0748 06/20/24 0003  GLUCAP 50* 49* 75 118* 103*   Iron  Studies: No results for input(s): IRON , TIBC, TRANSFERRIN, FERRITIN in the last 72 hours. @lablastinr3 @ Studies/Results: CT PELVIS WO CONTRAST Result Date: 06/20/2024 EXAM: CT PELVIS, WITHOUT IV CONTRAST 06/19/2024 10:01:00 PM TECHNIQUE: Axial images were acquired through the pelvis without IV contrast. Reformatted images were reviewed. Automated exposure control, iterative reconstruction, and/or weight based adjustment of the mA/kV was utilized to reduce the radiation dose to as low as reasonably achievable. COMPARISON: Prior examination of 09/29/2022. CLINICAL HISTORY: penile  calciphylaxis FINDINGS: BONES: Osseous structures are age appropriate. JOINTS: No dislocation. The joint spaces are normal. SOFT TISSUES: There is development of diffuse body wall and retroperitoneal edema in keeping with anasarca. Advanced vascular calcifications are noted within the penis and keeping with the patient's given history. Large left fat-containing inguinal hernia again noted. INTRAPELVIC CONTENTS: Extensive severe visceral and peripheral arterial sclerosis noted. The prostate gland is unremarkable. There is circumferential bladder wall thickening which appears new from prior examination of 09/29/2022 and they reflect a diffuse infectious or inflammatory cystitis. Correlation with urinalysis and urine culture may be helpful for further management. There  is circumferential rectal wall thickening and perirectal edema which may reflect a underlying proctitis. IMPRESSION: 1. Circumferential bladder wall thickening, new from prior examination, consistent with diffuse infectious or inflammatory cystitis. Correlation with urinalysis and urine culture may be helpful for further management. 2. Circumferential rectal wall thickening and perirectal edema, possibly reflecting underlying proctitis. 3. Large left fat-containing inguinal hernia, again noted. 4. Advanced arterial sclerosis. Advanced vascular calcifications within the penis, consistent with the patient's given history. Electronically signed by: Dorethia Molt MD 06/20/2024 12:11 AM EST RP Workstation: [id number]   Medications:  anticoagulant sodium citrate      sodium thiosulfate  25 g in sodium chloride  0.9 % 200 mL Infusion for Calciphylaxis      amiodarone   200 mg Oral Daily   atorvastatin   20 mg Oral QHS   cephALEXin   500 mg Oral Q24H   Chlorhexidine  Gluconate Cloth  6 each Topical Q0600   feeding supplement  237 mL Oral BID BM   hydrocerin   Topical BID   liver oil-zinc  oxide   Topical BID   midodrine   5 mg Oral TID WC   pantoprazole  (PROTONIX ) IV  40 mg Intravenous BID   tamsulosin   0.4 mg Oral Q M,W,F    Dialysis Orders:  [facility] MWF 3h B400  52.3kg  AVF  Heparin  none Last OP HD 11/12, post wt 51.3kg Mircera 225 mcg q 2wks, last 11/12, due 11/26 Old Hb's: 8.0 on 11/12, 9.0 on 11.06, 8.9 on 10/29  Assessment/Plan: GIB/Melena. H/o AVMs. Received 2 u prbcs. S/p EGD w/o acute findings. Elqiuis on hold.  ESRD. on HD MWF. Continue per usual schedule.  HTN.  BP soft. On midodrine  5 mg Tid.  Volume.  Anasarca noted on CT. Push UF with next HD.  Anemia. Hb 11s s/p prbcs. No ESA indication at present. HFrEF. Optimze volume with HD PAFib. 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 Ca/Phos at goal. 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  ADDEN: Provided update from SW at his home unit --he has been referred to outpatient palliative care for continued GOC.   Maisie Fellows Demika Langenderfer PA-C 06/21/2024,9:20 AM

## 2024-06-21 NOTE — TOC Transition Note (Addendum)
 Transition of Care [facility]) - Discharge Note   Patient Details  Name: [name] MRN: [id number] Date of Birth: [birth year]  [facility] CM/SW Contact:  Isaiah Public, LCSWA Phone Number: 06/21/2024, 12:11 PM   Clinical Narrative:     Patient will DC to: [facility]   Anticipated DC date: 06/21/2024  Family notified: Patient gave CSW permission to notify his friend Tammy. CSW notified patients friend Tammy.  Transport by: [facility]  ?  Per MD patient ready for DC to [facility] . RN, patient, patient's family, Abigail renal navigator, and facility notified of DC. Discharge Summary sent to facility. RN given number for report [phone] RM# [facility]. DC packet on chart. Ambulance transport requested for patient.  CSW signing off.   Final next level of care: Skilled Nursing Facility Barriers to Discharge: No Barriers Identified   Patient Goals and CMS Choice            Discharge Placement              Patient chooses bed at: [facility] Patient to be transferred to facility by: [facility]   Patient and family notified of of transfer: 06/21/24  Discharge Plan and Services Additional resources added to the After Visit Summary for   In-house Referral: Clinical Social Work   Post Acute Care Choice: Skilled Nursing Facility                               Social Drivers of Health (SDOH) Interventions SDOH Screenings   Food Insecurity: Patient Declined (06/15/2024)  Housing: Unknown (06/15/2024)  Transportation Needs: Patient Declined (06/15/2024)  Utilities: Patient Declined (06/15/2024)  Social Connections: Patient Declined (06/15/2024)  Tobacco Use: Low Risk  (06/13/2024)     Readmission Risk Interventions     No data to display

## 2024-06-21 NOTE — Progress Notes (Signed)
 This chaplain responded to the unit page for creating the Pt. Advance Directive. The Pt. and the Pt. friend-Tammy are accepting of the chaplain visit. The Pt. is able to articulate his choice of Tammy as healthcare decision maker, but unable to communicate the role of the healthcare decision maker after AD education. The chaplain did not provide education on a Living Will.  Education was provided with Tammy on how to complete an AD outside the hospital. This chaplain understands out patient Palliative Care may assist with AD completion. The incomplete AD document was given to Tammy to accompany the Pt. discharge.  Spiritual Care services are available as needed.  Chaplain Leeroy Hummer [phone]

## 2024-06-21 NOTE — Progress Notes (Signed)
   06/21/24 1240  Vitals  Temp 97.9 F (36.6 C)  Pulse Rate 93  Resp (!) 21  BP (!) 148/51  SpO2 99 %  O2 Device Room Air  Type of Weight Post-Dialysis  Oxygen Therapy  Patient Activity (if Appropriate) In bed  Pulse Oximetry Type Continuous  Oximetry Probe Site Changed No  Post Treatment  Dialyzer Clearance Lightly streaked  Hemodialysis Intake (mL) 200 mL  Liters Processed 70.8  Fluid Removed (mL) 2200 mL  Tolerated HD Treatment Yes  AVG/AVF Arterial Site Held (minutes) 10 minutes  AVG/AVF Venous Site Held (minutes) 10 minutes   Received patient in bed to unit.  Alert and oriented.  Informed consent signed and in chart.   TX duration:3.5  Patient tolerated well.  Transported back to the room  Alert, without acute distress.  Hand-off given to patient's nurse.   Access used: LUAF Access issues: no complications  Total UF removed: 2000 Medication(s) given: Na thoi IV x 1   [name]

## 2024-06-21 NOTE — Progress Notes (Signed)
 D/c orders noted. Contacted pt out-pt hd clinic, [facility] on MWF at 10:45am, to inform of pt d/c and expected arrival back at clinic 06/24/2024. D/c summary has been faxed over. No further assistance needed at this time.    Suzen Satchel Dialysis Navigator [phone]

## 2024-06-21 NOTE — Plan of Care (Signed)
  Problem: Clinical Measurements: Goal: Ability to avoid or minimize complications of infection will improve Outcome: Progressing   Problem: Skin Integrity: Goal: Skin integrity will improve Outcome: Progressing   Problem: Clinical Measurements: Goal: Ability to maintain clinical measurements within normal limits will improve Outcome: Progressing Goal: Will remain free from infection Outcome: Progressing Goal: Diagnostic test results will improve Outcome: Progressing Goal: Respiratory complications will improve Outcome: Progressing Goal: Cardiovascular complication will be avoided Outcome: Progressing

## 2024-06-21 NOTE — Discharge Summary (Signed)
 Physician Discharge Summary  [name] FMW:[id number] DOB: Feb 24, 1956 DOA: 06/13/2024  PCP: Patient, No Pcp Per  Admit date: 06/13/2024  Discharge date: 06/21/2024  Admitted From: SNF  Disposition:  SNF  Recommendations for Outpatient Follow-up:  Follow up with PCP in 1-2 weeks. Please obtain BMP/CBC in one week. Advised to follow-up with Nephrology for continuation of hemodialysis. 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 Advised to continue sodium thiosulfate  during hemodialysis as per urology/ nephrology  Home Health:None Equipment/Devices:None  Discharge Condition: Stable CODE STATUS:Full code Diet recommendation: Renal Diet  Brief Summary/ Hospital Course: This 68 y.o. 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  His vitals were stable on arrival but noted to have large amount of melena in his diaper.  Hemoglobin 8.3 from baseline of 9.  positive FOBT.  Patient was admitted and started on IV PPIs, GI was consulted,  Patient was given 1 unit of PRBC. Patient underwent EGD which was unremarkable.  Patient declined to have colonoscopy. H&H remained stable.  Patient has been refusing colonoscopy.  Patient was getting hemodialysis. Subsequently patient was resumed on Eliquis .  Urology was consulted for penile gangrene.  Recommended to continue antibiotics and continue sodium thiosulfate  during hemodialysis.  Urology recommended outpatient follow-up.  Patient being discharged to skilled nursing facility for rehab.  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 Hemoglobin 8.8 on admission  s/p 2 unit of PRBC Hb improved to 11.1 EGD this admission with no obvious bleeding. Colonoscopy was also discussed but patient declined. H/H has been improving.  Hb 10.8 > 11.3 . 11.4 Continue with Protonix   Eliquis  resumed.  H/H remains stable.   Paroxysmal atrial fibrillation (HCC) Currently in sinus rhythm. HR controlled. Continuing home amiodarone . Holding metoprolol  due to softer blood pressure Eliquis  resumed.  Continue Eliquis .   Chronic HFrEF (heart failure with reduced ejection fraction) (HCC) Prior echo done on 08/20/2022 with EF of 40 to 45%.  Clinically appears euvolemic. Volume is being managed with dialysis.   Gangrene of penis: Concern of superadded cellulitis: Recently diagnosed with gangrene of the tip of penis at urology office. Started on broad-spectrum antibiotics with cefepime  and vancomycin  for concern of superadded cellulitis. Continue with supportive care 11/16, blood culture growing GPC, staphylococcus hominis. Discussed with pharmacist antibiotics changed to ceftriaxone . Urology is consulted for significantly swollen scrotum with gangrenous tip of penis. Urology recommended No surgical indication, favor allowing autoamputation. Follow up in clinic 4 weeks.  Continue Keflex  for 7 more days.   Hypotension: BP was soft initially. He was continued midodrine  5 mg p.o. 3 times daily Holding metoprolol   ESRD on hemodialysis [facility]): Hemodialysis MWF schedule Nephrology  following, next HD will be today.   Hyperlipemia: Continue with home statin.   Pressure injury: Patient was found to have multiple unstageable pressure injuries, POA. Involving buttock, ischium and heel. -Wound care was consulted.   BPH (benign prostatic hyperplasia) Continue Flomax .  Discharge Instructions  Discharge Instructions     Call MD for:  difficulty breathing, headache or visual disturbances   Complete by: As directed    Call MD for:  persistant dizziness or light-headedness   Complete by: As directed    Call MD for:  persistant nausea and vomiting   Complete by: As directed    Diet - low sodium heart healthy   Complete by: As directed    Diet general   Complete by: As directed    Renal Diet   Discharge instructions   Complete by: As directed    Advised to follow-up with primary care physician in 1 week. Advised to follow-up with nephrology for continuation of hemodialysis. 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 Advised to continue sodium thiosulfate  during hemodialysis as per urology nephrology   Discharge wound care:   Complete by: As directed    Continue wound care at nursing home.   Increase activity slowly   Complete by: As directed       Allergies as of 06/21/2024       Reactions   Ultram  [tramadol ] Nausea And Vomiting        Medication List     STOP taking these medications    metoprolol  tartrate 25 MG tablet Commonly known as: LOPRESSOR    oxycodone  5 MG capsule Commonly known as: OXY-IR Replaced by: oxyCODONE  5 MG immediate release tablet   sodium zirconium cyclosilicate  10 g Pack packet Commonly known as: LOKELMA        TAKE these medications    amiodarone  200 MG tablet Commonly known as: PACERONE  Take 1 tablet (200 mg total) by mouth daily.   apixaban  2.5 MG Tabs tablet Commonly known as: ELIQUIS  Take 1 tablet (2.5 mg total) by mouth 2 (two) times daily.   atorvastatin  20 MG tablet Commonly  known as: LIPITOR Take 1 tablet (20 mg total) by mouth daily. What changed: when to take this   Auryxia  1 GM 210 MG(Fe) tablet Generic drug: ferric citrate  Take 210 mg by mouth with breakfast, with lunch, and with evening meal.   bethanechol  5 MG tablet Commonly known as: URECHOLINE  Take 5 mg by mouth 3 (three) times daily. (0600, 1200 & 2000)   cephALEXin  500 MG capsule Commonly known as: KEFLEX  Take 1 capsule (500 mg total) by mouth 2 (two) times daily for 7 days.   clotrimazole -betamethasone cream Commonly known as: LOTRISONE Apply 1 Application topically daily. Apply topically to rash every day shift.   cyanocobalamin  1000 MCG tablet Take 1,000 mcg by mouth daily.   folic acid  1 MG tablet Commonly known as: FOLVITE  Take 1 mg by mouth in the morning.   hydrocortisone  cream 1 % Apply 1 Application topically daily as needed (rash). Applied to arm and elbow   liver oil-zinc  oxide 40 % ointment Commonly known as: DESITIN Apply 1 Application topically in the morning and at bedtime. Apply topically to buttocks and sacrum area every day and night shift for incontinence/preventative care.   loperamide 2 MG capsule Commonly known as: IMODIUM Take 2 mg by mouth as needed for diarrhea or loose stools.   midodrine  5 MG tablet Commonly  known as: PROAMATINE  Take 1 tablet (5 mg total) by mouth 3 (three) times daily with meals.   oxyCODONE  5 MG immediate release tablet Commonly known as: Oxy IR/ROXICODONE  Take 1 tablet (5 mg total) by mouth every 4 (four) hours as needed for up to 3 days for moderate pain (pain score 4-6) or breakthrough pain. Replaces: oxycodone  5 MG capsule   OXYGEN Inhale 2 L/min into the lungs as needed (shortness of breath or maintain sats > 90%).   pantoprazole  40 MG tablet Commonly known as: PROTONIX  Take 1 tablet (40 mg total) by mouth 2 (two) times daily before a meal.   Pro-Stat Liqd Take 30 mLs by mouth 2 (two) times daily. (1000 & 1400)    promethazine  25 MG/ML injection Commonly known as: PHENERGAN  Inject 25 mg into the muscle every 4 (four) hours as needed for nausea or vomiting.   silver sulfADIAZINE 1 % cream Commonly known as: SILVADENE Apply 1 Application topically 3 (three) times daily.   tamsulosin  0.4 MG Caps capsule Commonly known as: FLOMAX  Take 0.4 mg by mouth every Monday, Wednesday, and Friday. Take one tablet by mouth 3 times a week on Monday, Wednesday and Fridays               Discharge Care Instructions  (From admission, onward)           Start     Ordered   06/21/24 0000  Discharge wound care:       Comments: Continue wound care at nursing home.   06/21/24 1202            Follow-up Information     Delia Ole ORN, NP Follow up in 1 week(s).   Specialty: Urology Contact information: [address] [place] [address] [place] [address] [phone]                Allergies  Allergen Reactions   Ultram  [Tramadol ] Nausea And Vomiting    Consultations: Nephrology Urology Gastroenterology   Procedures/Studies: CT PELVIS WO CONTRAST Result Date: 06/20/2024 EXAM: CT PELVIS, WITHOUT IV CONTRAST 06/19/2024 10:01:00 PM TECHNIQUE: Axial images were acquired through the pelvis without IV contrast. Reformatted images were reviewed. Automated exposure control, iterative reconstruction, and/or weight based adjustment of the mA/kV was utilized to reduce the radiation dose to as low as reasonably achievable. COMPARISON: Prior examination of 09/29/2022. CLINICAL HISTORY: penile calciphylaxis FINDINGS: BONES: Osseous structures are age appropriate. JOINTS: No dislocation. The joint spaces are normal. SOFT TISSUES: There is development of diffuse body wall and retroperitoneal edema in keeping with anasarca. Advanced vascular calcifications are noted within the penis and keeping with the patient's given history. Large left fat-containing inguinal hernia again noted. INTRAPELVIC CONTENTS:  Extensive severe visceral and peripheral arterial sclerosis noted. The prostate gland is unremarkable. There is circumferential bladder wall thickening which appears new from prior examination of 09/29/2022 and they reflect a diffuse infectious or inflammatory cystitis. Correlation with urinalysis and urine culture may be helpful for further management. There is circumferential rectal wall thickening and perirectal edema which may reflect a underlying proctitis. IMPRESSION: 1. Circumferential bladder wall thickening, new from prior examination, consistent with diffuse infectious or inflammatory cystitis. Correlation with urinalysis and urine culture may be helpful for further management. 2. Circumferential rectal wall thickening and perirectal edema, possibly reflecting underlying proctitis. 3. Large left fat-containing inguinal hernia, again noted. 4. Advanced arterial sclerosis. Advanced vascular calcifications within the penis, consistent with the patient's given history. Electronically signed by: Dorethia Molt MD 06/20/2024  12:11 AM EST RP Workstation: [id number]   DG Chest Port 1 View Result Date: 06/13/2024 CLINICAL DATA:  GI bleed EXAM: PORTABLE CHEST 1 VIEW COMPARISON:  Chest x-ray 03/22/2024 FINDINGS: There is minimal left basilar atelectasis or airspace disease. There is no pleural effusion or pneumothorax. The heart is mildly enlarged. Vascular stent is noted in the left axilla. No acute fractures are identified. IMPRESSION: 1. Minimal left basilar atelectasis or airspace disease. 2. Mild cardiomegaly. Electronically Signed   By: Greig Pique M.D.   On: 06/13/2024 21:09    Subjective: Patient was seen and examined at bedside.  Overnight events noted. Patient is being discharged today to SNF after hemodialysis.  He feels better and wants to be discharged. Patient will be continuing on Keflex  for penile cellulitis.  Discharge Exam: Vitals:   06/21/24 1240 06/21/24 1337  BP: (!) 148/51 96/64   Pulse: 93 92  Resp: (!) 21 20  Temp: 97.9 F (36.6 C) 97.8 F (36.6 C)  SpO2: 99% 97%   Vitals:   06/21/24 1200 06/21/24 1230 06/21/24 1240 06/21/24 1337  BP: (!) 143/54 (!) 156/56 (!) 148/51 96/64  Pulse: 86 89 93 92  Resp: 17 19 (!) 21 20  Temp:   97.9 F (36.6 C) 97.8 F (36.6 C)  TempSrc:    Oral  SpO2: 99% 100% 99% 97%  Weight:      Height:        General: Pt is alert, awake, not in acute distress Cardiovascular: RRR, S1/S2 +, no rubs, no gallops Respiratory: CTA bilaterally, no wheezing, no rhonchi Abdominal: Soft, NT, ND, bowel sounds + Extremities: no edema, no cyanosis    The results of significant diagnostics from this hospitalization (including imaging, microbiology, ancillary and laboratory) are listed below for reference.     Microbiology: Recent Results (from the past 240 hours)  Culture, blood (Routine X 2) w Reflex to ID Panel     Status: None   Collection Time: 06/13/24 10:52 PM   Specimen: BLOOD  Result Value Ref Range Status   Specimen Description BLOOD RIGHT ANTECUBITAL  Final   Special Requests   Final    BOTTLES DRAWN AEROBIC AND ANAEROBIC Blood Culture adequate volume   Culture   Final    NO GROWTH 5 DAYS Performed at [facility], [address], [place], [place] [address]    Report Status 06/19/2024 FINAL  Final  Culture, blood (Routine X 2) w Reflex to ID Panel     Status: Abnormal   Collection Time: 06/13/24 10:57 PM   Specimen: BLOOD  Result Value Ref Range Status   Specimen Description BLOOD BLOOD RIGHT HAND  Final   Special Requests   Final    BOTTLES DRAWN AEROBIC AND ANAEROBIC Blood Culture results may not be optimal due to an inadequate volume of blood received in culture bottles   Culture  Setup Time   Final    GRAM POSITIVE COCCI AEROBIC BOTTLE ONLY CRITICAL RESULT CALLED TO, READ BACK BY AND VERIFIED WITH: PHARMD GREG ABBOTT [phone] [id number] BY J RAZZAK, MT    Culture (A)  Final    STAPHYLOCOCCUS HOMINIS THE  SIGNIFICANCE OF ISOLATING THIS ORGANISM FROM A SINGLE SET OF BLOOD CULTURES WHEN MULTIPLE SETS ARE DRAWN IS UNCERTAIN. PLEASE NOTIFY THE MICROBIOLOGY DEPARTMENT WITHIN ONE WEEK IF SPECIATION AND SENSITIVITIES ARE REQUIRED. Performed at [facility], [address], [place], [place] [address]    Report Status 06/16/2024 FINAL  Final  Blood Culture  ID Panel (Reflexed)     Status: Abnormal   Collection Time: 06/13/24 10:57 PM  Result Value Ref Range Status   Enterococcus faecalis NOT DETECTED NOT DETECTED Final   Enterococcus Faecium NOT DETECTED NOT DETECTED Final   Listeria monocytogenes NOT DETECTED NOT DETECTED Final   Staphylococcus species DETECTED (A) NOT DETECTED Final    Comment: CRITICAL RESULT CALLED TO, READ BACK BY AND VERIFIED WITH: PHARMD GREG ABBOTT [phone] [phone] BY J RAZZAK, MT    Staphylococcus aureus (BCID) NOT DETECTED NOT DETECTED Final   Staphylococcus epidermidis NOT DETECTED NOT DETECTED Final   Staphylococcus lugdunensis NOT DETECTED NOT DETECTED Final   Streptococcus species NOT DETECTED NOT DETECTED Final   Streptococcus agalactiae NOT DETECTED NOT DETECTED Final   Streptococcus pneumoniae NOT DETECTED NOT DETECTED Final   Streptococcus pyogenes NOT DETECTED NOT DETECTED Final   A.calcoaceticus-baumannii NOT DETECTED NOT DETECTED Final   Bacteroides fragilis NOT DETECTED NOT DETECTED Final   Enterobacterales NOT DETECTED NOT DETECTED Final   Enterobacter cloacae complex NOT DETECTED NOT DETECTED Final   Escherichia coli NOT DETECTED NOT DETECTED Final   Klebsiella aerogenes NOT DETECTED NOT DETECTED Final   Klebsiella oxytoca NOT DETECTED NOT DETECTED Final   Klebsiella pneumoniae NOT DETECTED NOT DETECTED Final   Proteus species NOT DETECTED NOT DETECTED Final   Salmonella species NOT DETECTED NOT DETECTED Final   Serratia marcescens NOT DETECTED NOT DETECTED Final   Haemophilus influenzae NOT DETECTED NOT DETECTED Final   Neisseria meningitidis NOT  DETECTED NOT DETECTED Final   Pseudomonas aeruginosa NOT DETECTED NOT DETECTED Final   Stenotrophomonas maltophilia NOT DETECTED NOT DETECTED Final   Candida albicans NOT DETECTED NOT DETECTED Final   Candida auris NOT DETECTED NOT DETECTED Final   Candida glabrata NOT DETECTED NOT DETECTED Final   Candida krusei NOT DETECTED NOT DETECTED Final   Candida parapsilosis NOT DETECTED NOT DETECTED Final   Candida tropicalis NOT DETECTED NOT DETECTED Final   Cryptococcus neoformans/gattii NOT DETECTED NOT DETECTED Final    Comment: Performed at [facility], [address] [place], [place] [address]  MRSA Next Gen by PCR, Nasal     Status: None   Collection Time: 06/14/24  6:26 AM   Specimen: Nasal Mucosa; Nasal Swab  Result Value Ref Range Status   MRSA by PCR Next Gen NOT DETECTED NOT DETECTED Final    Comment: (NOTE) The GeneXpert MRSA Assay (FDA approved for NASAL specimens only), is one component of a comprehensive MRSA colonization surveillance program. It is not intended to diagnose MRSA infection nor to guide or monitor treatment for MRSA infections. Test performance is not FDA approved in patients less than 72 years old. Performed at [facility], [address] [place], [place] [address]      Labs: BNP (last 3 results) Recent Labs    08/24/23 2149  BNP 809.2*   Basic Metabolic Panel: Recent Labs  Lab 06/17/24 0546 06/18/24 0534 06/19/24 0449 06/20/24 0405 06/21/24 0411  NA 141 137 139 138 137  K 4.6 3.7 4.3 3.9 4.8  CL 100 99 98 95* 97*  CO2 23 22 24 25 24   GLUCOSE 83 52* 120* 87 116*  BUN 29* 14 22 12 21   CREATININE 3.73* 2.32* 3.23* 2.08* 2.83*  CALCIUM  8.4* 7.9* 8.1* 8.0* 8.0*  MG 1.8  --   --   --   --   PHOS 4.0  --   --   --  3.2  Liver Function Tests: Recent Labs  Lab 06/14/24 2359  AST 13*  ALT 8  ALKPHOS 68  BILITOT 1.3*  PROT 5.7*  ALBUMIN  2.3*   No results for input(s): LIPASE, AMYLASE in the last 168 hours. No results  for input(s): AMMONIA in the last 168 hours. CBC: Recent Labs  Lab 06/17/24 0546 06/18/24 0534 06/19/24 0449 06/20/24 0405 06/21/24 0411  WBC 12.6* 11.7* 10.3 11.0* 10.3  NEUTROABS  --  10.3*  --   --   --   HGB 11.3* 11.1* 11.4* 11.2* 12.0*  HCT 36.3* 36.0* 36.9* 36.5* 39.6  MCV 97.3 98.4 97.1 96.3 98.0  PLT 210 178 165 161 135*   Cardiac Enzymes: No results for input(s): CKTOTAL, CKMB, CKMBINDEX, TROPONINI in the last 168 hours. BNP: Invalid input(s): POCBNP CBG: Recent Labs  Lab 06/18/24 0816 06/18/24 0848 06/18/24 0928 06/19/24 0748 06/20/24 0003  GLUCAP 50* 49* 75 118* 103*   D-Dimer No results for input(s): DDIMER in the last 72 hours. Hgb A1c No results for input(s): HGBA1C in the last 72 hours. Lipid Profile No results for input(s): CHOL, HDL, LDLCALC, TRIG, CHOLHDL, LDLDIRECT in the last 72 hours. Thyroid function studies No results for input(s): TSH, T4TOTAL, T3FREE, THYROIDAB in the last 72 hours.  Invalid input(s): FREET3 Anemia work up No results for input(s): VITAMINB12, FOLATE, FERRITIN, TIBC, IRON , RETICCTPCT in the last 72 hours. Urinalysis    Component Value Date/Time   COLORURINE AMBER (A) 08/25/2023 0437   APPEARANCEUR CLOUDY (A) 08/25/2023 0437   LABSPEC 1.015 08/25/2023 0437   PHURINE 9.0 (H) 08/25/2023 0437   GLUCOSEU NEGATIVE 08/25/2023 0437   HGBUR SMALL (A) 08/25/2023 0437   BILIRUBINUR NEGATIVE 08/25/2023 0437   KETONESUR NEGATIVE 08/25/2023 0437   PROTEINUR 100 (A) 08/25/2023 0437   UROBILINOGEN 0.2 09/15/2013 0052   NITRITE NEGATIVE 08/25/2023 0437   LEUKOCYTESUR LARGE (A) 08/25/2023 0437   Sepsis Labs Recent Labs  Lab 06/18/24 0534 06/19/24 0449 06/20/24 0405 06/21/24 0411  WBC 11.7* 10.3 11.0* 10.3   Microbiology Recent Results (from the past 240 hours)  Culture, blood (Routine X 2) w Reflex to ID Panel     Status: None   Collection Time: 06/13/24 10:52 PM    Specimen: BLOOD  Result Value Ref Range Status   Specimen Description BLOOD RIGHT ANTECUBITAL  Final   Special Requests   Final    BOTTLES DRAWN AEROBIC AND ANAEROBIC Blood Culture adequate volume   Culture   Final    NO GROWTH 5 DAYS Performed at [facility], [address] [place], [place] [address]    Report Status 06/19/2024 FINAL  Final  Culture, blood (Routine X 2) w Reflex to ID Panel     Status: Abnormal   Collection Time: 06/13/24 10:57 PM   Specimen: BLOOD  Result Value Ref Range Status   Specimen Description BLOOD BLOOD RIGHT HAND  Final   Special Requests   Final    BOTTLES DRAWN AEROBIC AND ANAEROBIC Blood Culture results may not be optimal due to an inadequate volume of blood received in culture bottles   Culture  Setup Time   Final    GRAM POSITIVE COCCI AEROBIC BOTTLE ONLY CRITICAL RESULT CALLED TO, READ BACK BY AND VERIFIED WITH: PHARMD GREG ABBOTT [phone] [phone] BY J RAZZAK, MT    Culture (A)  Final    STAPHYLOCOCCUS HOMINIS THE SIGNIFICANCE OF ISOLATING THIS ORGANISM FROM A SINGLE SET OF BLOOD CULTURES WHEN MULTIPLE SETS ARE DRAWN IS UNCERTAIN.  PLEASE NOTIFY THE MICROBIOLOGY DEPARTMENT WITHIN ONE WEEK IF SPECIATION AND SENSITIVITIES ARE REQUIRED. Performed at [facility], [address], [place], [place] [address]    Report Status 06/16/2024 FINAL  Final  Blood Culture ID Panel (Reflexed)     Status: Abnormal   Collection Time: 06/13/24 10:57 PM  Result Value Ref Range Status   Enterococcus faecalis NOT DETECTED NOT DETECTED Final   Enterococcus Faecium NOT DETECTED NOT DETECTED Final   Listeria monocytogenes NOT DETECTED NOT DETECTED Final   Staphylococcus species DETECTED (A) NOT DETECTED Final    Comment: CRITICAL RESULT CALLED TO, READ BACK BY AND VERIFIED WITH: PHARMD GREG ABBOTT [phone] [phone] BY J RAZZAK, MT    Staphylococcus aureus (BCID) NOT DETECTED NOT DETECTED Final   Staphylococcus epidermidis NOT DETECTED NOT DETECTED Final    Staphylococcus lugdunensis NOT DETECTED NOT DETECTED Final   Streptococcus species NOT DETECTED NOT DETECTED Final   Streptococcus agalactiae NOT DETECTED NOT DETECTED Final   Streptococcus pneumoniae NOT DETECTED NOT DETECTED Final   Streptococcus pyogenes NOT DETECTED NOT DETECTED Final   A.calcoaceticus-baumannii NOT DETECTED NOT DETECTED Final   Bacteroides fragilis NOT DETECTED NOT DETECTED Final   Enterobacterales NOT DETECTED NOT DETECTED Final   Enterobacter cloacae complex NOT DETECTED NOT DETECTED Final   Escherichia coli NOT DETECTED NOT DETECTED Final   Klebsiella aerogenes NOT DETECTED NOT DETECTED Final   Klebsiella oxytoca NOT DETECTED NOT DETECTED Final   Klebsiella pneumoniae NOT DETECTED NOT DETECTED Final   Proteus species NOT DETECTED NOT DETECTED Final   Salmonella species NOT DETECTED NOT DETECTED Final   Serratia marcescens NOT DETECTED NOT DETECTED Final   Haemophilus influenzae NOT DETECTED NOT DETECTED Final   Neisseria meningitidis NOT DETECTED NOT DETECTED Final   Pseudomonas aeruginosa NOT DETECTED NOT DETECTED Final   Stenotrophomonas maltophilia NOT DETECTED NOT DETECTED Final   Candida albicans NOT DETECTED NOT DETECTED Final   Candida auris NOT DETECTED NOT DETECTED Final   Candida glabrata NOT DETECTED NOT DETECTED Final   Candida krusei NOT DETECTED NOT DETECTED Final   Candida parapsilosis NOT DETECTED NOT DETECTED Final   Candida tropicalis NOT DETECTED NOT DETECTED Final   Cryptococcus neoformans/gattii NOT DETECTED NOT DETECTED Final    Comment: Performed at [facility], [address], [place], [place] [address]  MRSA Next Gen by PCR, Nasal     Status: None   Collection Time: 06/14/24  6:26 AM   Specimen: Nasal Mucosa; Nasal Swab  Result Value Ref Range Status   MRSA by PCR Next Gen NOT DETECTED NOT DETECTED Final    Comment: (NOTE) The GeneXpert MRSA Assay (FDA approved for NASAL specimens only), is one component of a comprehensive  MRSA colonization surveillance program. It is not intended to diagnose MRSA infection nor to guide or monitor treatment for MRSA infections. Test performance is not FDA approved in patients less than 67 years old. Performed at [facility], [address], [place], [place] [address]      Time coordinating discharge: Over 30 minutes  SIGNED:   Darcel Dawley, MD  [facility] 06/21/2024, 2:28 PM Pager   If 7PM-7AM, please contact night-coverage

## 2024-06-21 NOTE — Discharge Instructions (Signed)
 Advised to follow-up with Nephrology for continuation of hemodialysis. 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 Advised to continue sodium thiosulfate  during hemodialysis as per urology/ nephrology. Advised to follow-up with urology as scheduled.

## 2024-06-21 NOTE — Care Management Important Message (Signed)
 Important Message  Patient Details  Name: [name] MRN: [id number] Date of Birth: 07/08/1956   Important Message Given:  Yes - Medicare IM     Vonzell Arrie Sharps 06/21/2024, 10:57 AM

## 2024-06-29 ENCOUNTER — Inpatient Hospital Stay (HOSPITAL_COMMUNITY)
Admission: EM | Admit: 2024-06-29 | Discharge: 2024-07-01 | DRG: 871 | Disposition: E | Attending: Internal Medicine | Admitting: Internal Medicine

## 2024-06-29 ENCOUNTER — Inpatient Hospital Stay (HOSPITAL_COMMUNITY)

## 2024-06-29 ENCOUNTER — Emergency Department (HOSPITAL_COMMUNITY)

## 2024-06-29 DIAGNOSIS — D631 Anemia in chronic kidney disease: Secondary | ICD-10-CM | POA: Diagnosis present

## 2024-06-29 DIAGNOSIS — E11649 Type 2 diabetes mellitus with hypoglycemia without coma: Secondary | ICD-10-CM | POA: Diagnosis present

## 2024-06-29 DIAGNOSIS — Y838 Other surgical procedures as the cause of abnormal reaction of the patient, or of later complication, without mention of misadventure at the time of the procedure: Secondary | ICD-10-CM | POA: Diagnosis not present

## 2024-06-29 DIAGNOSIS — Z9049 Acquired absence of other specified parts of digestive tract: Secondary | ICD-10-CM

## 2024-06-29 DIAGNOSIS — Z79899 Other long term (current) drug therapy: Secondary | ICD-10-CM

## 2024-06-29 DIAGNOSIS — A419 Sepsis, unspecified organism: Secondary | ICD-10-CM | POA: Diagnosis present

## 2024-06-29 DIAGNOSIS — R680 Hypothermia, not associated with low environmental temperature: Secondary | ICD-10-CM | POA: Diagnosis present

## 2024-06-29 DIAGNOSIS — Z66 Do not resuscitate: Secondary | ICD-10-CM | POA: Diagnosis not present

## 2024-06-29 DIAGNOSIS — K7200 Acute and subacute hepatic failure without coma: Secondary | ICD-10-CM | POA: Diagnosis present

## 2024-06-29 DIAGNOSIS — R570 Cardiogenic shock: Secondary | ICD-10-CM | POA: Diagnosis present

## 2024-06-29 DIAGNOSIS — E872 Acidosis, unspecified: Secondary | ICD-10-CM | POA: Diagnosis present

## 2024-06-29 DIAGNOSIS — J690 Pneumonitis due to inhalation of food and vomit: Secondary | ICD-10-CM | POA: Diagnosis present

## 2024-06-29 DIAGNOSIS — Z86711 Personal history of pulmonary embolism: Secondary | ICD-10-CM

## 2024-06-29 DIAGNOSIS — Z888 Allergy status to other drugs, medicaments and biological substances status: Secondary | ICD-10-CM

## 2024-06-29 DIAGNOSIS — Z992 Dependence on renal dialysis: Secondary | ICD-10-CM | POA: Diagnosis not present

## 2024-06-29 DIAGNOSIS — N2581 Secondary hyperparathyroidism of renal origin: Secondary | ICD-10-CM | POA: Diagnosis present

## 2024-06-29 DIAGNOSIS — Z89421 Acquired absence of other right toe(s): Secondary | ICD-10-CM

## 2024-06-29 DIAGNOSIS — Z833 Family history of diabetes mellitus: Secondary | ICD-10-CM

## 2024-06-29 DIAGNOSIS — Z638 Other specified problems related to primary support group: Secondary | ICD-10-CM

## 2024-06-29 DIAGNOSIS — L89619 Pressure ulcer of right heel, unspecified stage: Secondary | ICD-10-CM | POA: Diagnosis present

## 2024-06-29 DIAGNOSIS — D6832 Hemorrhagic disorder due to extrinsic circulating anticoagulants: Secondary | ICD-10-CM | POA: Diagnosis not present

## 2024-06-29 DIAGNOSIS — E43 Unspecified severe protein-calorie malnutrition: Secondary | ICD-10-CM | POA: Diagnosis present

## 2024-06-29 DIAGNOSIS — Z7901 Long term (current) use of anticoagulants: Secondary | ICD-10-CM | POA: Diagnosis not present

## 2024-06-29 DIAGNOSIS — Z515 Encounter for palliative care: Secondary | ICD-10-CM | POA: Diagnosis not present

## 2024-06-29 DIAGNOSIS — E1151 Type 2 diabetes mellitus with diabetic peripheral angiopathy without gangrene: Secondary | ICD-10-CM | POA: Diagnosis present

## 2024-06-29 DIAGNOSIS — R6521 Severe sepsis with septic shock: Secondary | ICD-10-CM | POA: Diagnosis present

## 2024-06-29 DIAGNOSIS — Z8616 Personal history of COVID-19: Secondary | ICD-10-CM | POA: Diagnosis not present

## 2024-06-29 DIAGNOSIS — I5022 Chronic systolic (congestive) heart failure: Secondary | ICD-10-CM | POA: Diagnosis present

## 2024-06-29 DIAGNOSIS — E119 Type 2 diabetes mellitus without complications: Secondary | ICD-10-CM | POA: Diagnosis not present

## 2024-06-29 DIAGNOSIS — I132 Hypertensive heart and chronic kidney disease with heart failure and with stage 5 chronic kidney disease, or end stage renal disease: Secondary | ICD-10-CM | POA: Diagnosis present

## 2024-06-29 DIAGNOSIS — I5020 Unspecified systolic (congestive) heart failure: Secondary | ICD-10-CM | POA: Diagnosis not present

## 2024-06-29 DIAGNOSIS — R4020 Unspecified coma: Secondary | ICD-10-CM | POA: Diagnosis present

## 2024-06-29 DIAGNOSIS — J9601 Acute respiratory failure with hypoxia: Secondary | ICD-10-CM | POA: Diagnosis present

## 2024-06-29 DIAGNOSIS — E1122 Type 2 diabetes mellitus with diabetic chronic kidney disease: Secondary | ICD-10-CM | POA: Diagnosis present

## 2024-06-29 DIAGNOSIS — I872 Venous insufficiency (chronic) (peripheral): Secondary | ICD-10-CM | POA: Diagnosis present

## 2024-06-29 DIAGNOSIS — E785 Hyperlipidemia, unspecified: Secondary | ICD-10-CM | POA: Diagnosis present

## 2024-06-29 DIAGNOSIS — I429 Cardiomyopathy, unspecified: Secondary | ICD-10-CM | POA: Diagnosis present

## 2024-06-29 DIAGNOSIS — R569 Unspecified convulsions: Secondary | ICD-10-CM | POA: Diagnosis not present

## 2024-06-29 DIAGNOSIS — E46 Unspecified protein-calorie malnutrition: Secondary | ICD-10-CM | POA: Diagnosis not present

## 2024-06-29 DIAGNOSIS — I252 Old myocardial infarction: Secondary | ICD-10-CM

## 2024-06-29 DIAGNOSIS — I480 Paroxysmal atrial fibrillation: Secondary | ICD-10-CM | POA: Diagnosis not present

## 2024-06-29 DIAGNOSIS — R54 Age-related physical debility: Secondary | ICD-10-CM | POA: Diagnosis present

## 2024-06-29 DIAGNOSIS — Z6821 Body mass index (BMI) 21.0-21.9, adult: Secondary | ICD-10-CM

## 2024-06-29 DIAGNOSIS — I469 Cardiac arrest, cause unspecified: Secondary | ICD-10-CM | POA: Diagnosis present

## 2024-06-29 DIAGNOSIS — E8721 Acute metabolic acidosis: Secondary | ICD-10-CM | POA: Diagnosis not present

## 2024-06-29 DIAGNOSIS — N186 End stage renal disease: Secondary | ICD-10-CM | POA: Diagnosis present

## 2024-06-29 DIAGNOSIS — D649 Anemia, unspecified: Secondary | ICD-10-CM | POA: Diagnosis not present

## 2024-06-29 DIAGNOSIS — R627 Adult failure to thrive: Secondary | ICD-10-CM | POA: Diagnosis present

## 2024-06-29 DIAGNOSIS — I2510 Atherosclerotic heart disease of native coronary artery without angina pectoris: Secondary | ICD-10-CM | POA: Diagnosis present

## 2024-06-29 DIAGNOSIS — Z89512 Acquired absence of left leg below knee: Secondary | ICD-10-CM

## 2024-06-29 DIAGNOSIS — Z8249 Family history of ischemic heart disease and other diseases of the circulatory system: Secondary | ICD-10-CM

## 2024-06-29 DIAGNOSIS — T82838A Hemorrhage of vascular prosthetic devices, implants and grafts, initial encounter: Secondary | ICD-10-CM | POA: Diagnosis not present

## 2024-06-29 DIAGNOSIS — Z86718 Personal history of other venous thrombosis and embolism: Secondary | ICD-10-CM

## 2024-06-29 DIAGNOSIS — N400 Enlarged prostate without lower urinary tract symptoms: Secondary | ICD-10-CM | POA: Diagnosis present

## 2024-06-29 LAB — MRSA NEXT GEN BY PCR, NASAL: MRSA by PCR Next Gen: NOT DETECTED

## 2024-06-29 LAB — POCT I-STAT 7, (LYTES, BLD GAS, ICA,H+H)
Acid-base deficit: 11 mmol/L — ABNORMAL HIGH (ref 0.0–2.0)
Bicarbonate: 15.9 mmol/L — ABNORMAL LOW (ref 20.0–28.0)
Calcium, Ion: 1.06 mmol/L — ABNORMAL LOW (ref 1.15–1.40)
HCT: 30 % — ABNORMAL LOW (ref 39.0–52.0)
Hemoglobin: 10.2 g/dL — ABNORMAL LOW (ref 13.0–17.0)
O2 Saturation: 99 %
Patient temperature: 97.7
Potassium: 3.3 mmol/L — ABNORMAL LOW (ref 3.5–5.1)
Sodium: 135 mmol/L (ref 135–145)
TCO2: 17 mmol/L — ABNORMAL LOW (ref 22–32)
pCO2 arterial: 36.6 mmHg (ref 32–48)
pH, Arterial: 7.244 — ABNORMAL LOW (ref 7.35–7.45)
pO2, Arterial: 166 mmHg — ABNORMAL HIGH (ref 83–108)

## 2024-06-29 LAB — CBC WITH DIFFERENTIAL/PLATELET
Abs Immature Granulocytes: 0.38 K/uL — ABNORMAL HIGH (ref 0.00–0.07)
Basophils Absolute: 0 K/uL (ref 0.0–0.1)
Basophils Relative: 0 %
Eosinophils Absolute: 0 K/uL (ref 0.0–0.5)
Eosinophils Relative: 0 %
HCT: 37.6 % — ABNORMAL LOW (ref 39.0–52.0)
Hemoglobin: 11.2 g/dL — ABNORMAL LOW (ref 13.0–17.0)
Immature Granulocytes: 2 %
Lymphocytes Relative: 4 %
Lymphs Abs: 0.7 K/uL (ref 0.7–4.0)
MCH: 29.2 pg (ref 26.0–34.0)
MCHC: 29.8 g/dL — ABNORMAL LOW (ref 30.0–36.0)
MCV: 97.9 fL (ref 80.0–100.0)
Monocytes Absolute: 0.3 K/uL (ref 0.1–1.0)
Monocytes Relative: 1 %
Neutro Abs: 19.7 K/uL — ABNORMAL HIGH (ref 1.7–7.7)
Neutrophils Relative %: 93 %
Platelets: 271 K/uL (ref 150–400)
RBC: 3.84 MIL/uL — ABNORMAL LOW (ref 4.22–5.81)
RDW: 19.3 % — ABNORMAL HIGH (ref 11.5–15.5)
WBC: 21.1 K/uL — ABNORMAL HIGH (ref 4.0–10.5)
nRBC: 0.5 % — ABNORMAL HIGH (ref 0.0–0.2)

## 2024-06-29 LAB — I-STAT CHEM 8, ED
BUN: 28 mg/dL — ABNORMAL HIGH (ref 8–23)
BUN: 27 mg/dL — ABNORMAL HIGH (ref 8–23)
Calcium, Ion: 1.04 mmol/L — ABNORMAL LOW (ref 1.15–1.40)
Calcium, Ion: 1 mmol/L — ABNORMAL LOW (ref 1.15–1.40)
Chloride: 102 mmol/L (ref 98–111)
Chloride: 99 mmol/L (ref 98–111)
Creatinine, Ser: 2.6 mg/dL — ABNORMAL HIGH (ref 0.61–1.24)
Creatinine, Ser: 2.5 mg/dL — ABNORMAL HIGH (ref 0.61–1.24)
Glucose, Bld: 157 mg/dL — ABNORMAL HIGH (ref 70–99)
Glucose, Bld: 212 mg/dL — ABNORMAL HIGH (ref 70–99)
HCT: 36 % — ABNORMAL LOW (ref 39.0–52.0)
HCT: 34 % — ABNORMAL LOW (ref 39.0–52.0)
Hemoglobin: 12.2 g/dL — ABNORMAL LOW (ref 13.0–17.0)
Hemoglobin: 11.6 g/dL — ABNORMAL LOW (ref 13.0–17.0)
Potassium: 4.2 mmol/L (ref 3.5–5.1)
Potassium: 3.7 mmol/L (ref 3.5–5.1)
Sodium: 138 mmol/L (ref 135–145)
Sodium: 138 mmol/L (ref 135–145)
TCO2: 17 mmol/L — ABNORMAL LOW (ref 22–32)
TCO2: 19 mmol/L — ABNORMAL LOW (ref 22–32)

## 2024-06-29 LAB — COMPREHENSIVE METABOLIC PANEL WITH GFR
ALT: 528 U/L — ABNORMAL HIGH (ref 0–44)
AST: 1219 U/L — ABNORMAL HIGH (ref 15–41)
Albumin: 1.9 g/dL — ABNORMAL LOW (ref 3.5–5.0)
Alkaline Phosphatase: 144 U/L — ABNORMAL HIGH (ref 38–126)
Anion gap: 33 — ABNORMAL HIGH (ref 5–15)
BUN: 24 mg/dL — ABNORMAL HIGH (ref 8–23)
CO2: 16 mmol/L — ABNORMAL LOW (ref 22–32)
Calcium: 8.9 mg/dL (ref 8.9–10.3)
Chloride: 93 mmol/L — ABNORMAL LOW (ref 98–111)
Creatinine, Ser: 3.16 mg/dL — ABNORMAL HIGH (ref 0.61–1.24)
GFR, Estimated: 21 mL/min — ABNORMAL LOW (ref >60)
Glucose, Bld: 236 mg/dL — ABNORMAL HIGH (ref 70–99)
Potassium: 4.1 mmol/L (ref 3.5–5.1)
Sodium: 142 mmol/L (ref 135–145)
Total Bilirubin: 2.3 mg/dL — ABNORMAL HIGH (ref 0.0–1.2)
Total Protein: 5.9 g/dL — ABNORMAL LOW (ref 6.5–8.1)

## 2024-06-29 LAB — I-STAT CG4 LACTIC ACID, ED
Lactic Acid, Venous: 5.6 mmol/L (ref 0.5–1.9)
Lactic Acid, Venous: 4.2 mmol/L (ref 0.5–1.9)

## 2024-06-29 LAB — GLUCOSE, CAPILLARY: Glucose-Capillary: 280 mg/dL — ABNORMAL HIGH (ref 70–99)

## 2024-06-29 LAB — CBG MONITORING, ED
Glucose-Capillary: 78 mg/dL (ref 70–99)
Glucose-Capillary: 199 mg/dL — ABNORMAL HIGH (ref 70–99)

## 2024-06-29 LAB — TROPONIN I (HIGH SENSITIVITY)
Troponin I (High Sensitivity): 152 ng/L (ref <18)
Troponin I (High Sensitivity): 198 ng/L (ref <18)

## 2024-06-29 LAB — PROTIME-INR
INR: 1.7 — ABNORMAL HIGH (ref 0.8–1.2)
Prothrombin Time: 20.4 s — ABNORMAL HIGH (ref 11.4–15.2)

## 2024-06-29 MED ORDER — VITAL HP 1.0 CAL PO LIQD
1000.0000 mL | ORAL | Status: DC
  Filled 2024-06-29: qty 1000

## 2024-06-29 MED ORDER — VANCOMYCIN HCL IN DEXTROSE 1-5 GM/200ML-% IV SOLN
1000.0000 mg | Freq: Once | INTRAVENOUS | Status: AC
  Administered 2024-06-29: 1000 mg via INTRAVENOUS
  Filled 2024-06-29: qty 200

## 2024-06-29 MED ORDER — PIPERACILLIN-TAZOBACTAM 3.375 G IVPB
3.3750 g | Freq: Three times a day (TID) | INTRAVENOUS | Status: DC
  Administered 2024-06-30: 3.375 g via INTRAVENOUS
  Filled 2024-06-29: qty 50

## 2024-06-29 MED ORDER — PANTOPRAZOLE SODIUM 40 MG IV SOLR
40.0000 mg | Freq: Two times a day (BID) | INTRAVENOUS | Status: DC
  Administered 2024-06-29 – 2024-06-30 (×2): 40 mg via INTRAVENOUS
  Filled 2024-06-29 (×2): qty 10

## 2024-06-29 MED ORDER — POTASSIUM CHLORIDE 10 MEQ/100ML IV SOLN
10.0000 meq | INTRAVENOUS | Status: AC
  Administered 2024-06-29 – 2024-06-30 (×4): 10 meq via INTRAVENOUS
  Filled 2024-06-29 (×4): qty 100

## 2024-06-29 MED ORDER — SODIUM CHLORIDE 0.9 % IV SOLN: 250.0000 mL | INTRAVENOUS | Status: AC

## 2024-06-29 MED ORDER — SODIUM CHLORIDE 0.9 % IV SOLN: INTRAVENOUS | Status: DC

## 2024-06-29 MED ORDER — OXIDIZED CELLULOSE EX PADS
1.0000 | MEDICATED_PAD | Freq: Once | CUTANEOUS | Status: AC
  Administered 2024-06-29: 1 via TOPICAL
  Filled 2024-06-29: qty 1

## 2024-06-29 MED ORDER — HEPARIN (PORCINE) 25000 UT/250ML-% IV SOLN: 800.0000 [IU]/h | INTRAVENOUS | Status: DC

## 2024-06-29 MED ORDER — VASOPRESSIN 20 UNITS/100 ML INFUSION FOR SHOCK
0.0000 [IU]/min | INTRAVENOUS | Status: DC
  Administered 2024-06-29: 0.03 [IU]/min via INTRAVENOUS
  Administered 2024-06-30 (×2): 0.04 [IU]/min via INTRAVENOUS
  Filled 2024-06-29 (×2): qty 100

## 2024-06-29 MED ORDER — ACETAMINOPHEN 650 MG RE SUPP: 650.0000 mg | RECTAL | Status: DC | PRN

## 2024-06-29 MED ORDER — ETOMIDATE 2 MG/ML IV SOLN
INTRAVENOUS | Status: AC | PRN
Start: 2024-06-29 — End: 2024-06-29
  Administered 2024-06-29: 10 mg via INTRAVENOUS

## 2024-06-29 MED ORDER — POTASSIUM CHLORIDE 20 MEQ PO PACK
40.0000 meq | PACK | Freq: Two times a day (BID) | ORAL | Status: DC
  Filled 2024-06-29: qty 2

## 2024-06-29 MED ORDER — TRANEXAMIC ACID-NACL 1000-0.7 MG/100ML-% IV SOLN
1000.0000 mg | Freq: Once | INTRAVENOUS | Status: AC
  Administered 2024-06-30: 1000 mg via INTRAVENOUS
  Filled 2024-06-29: qty 100

## 2024-06-29 MED ORDER — SODIUM BICARBONATE 8.4 % IV SOLN
INTRAVENOUS | Status: DC
  Filled 2024-06-29: qty 150

## 2024-06-29 MED ORDER — ONDANSETRON HCL 4 MG/2ML IJ SOLN: 4.0000 mg | Freq: Four times a day (QID) | INTRAMUSCULAR | Status: DC | PRN

## 2024-06-29 MED ORDER — VITAMIN K1 10 MG/ML IJ SOLN
10.0000 mg | Freq: Once | INTRAVENOUS | Status: AC
  Administered 2024-06-30: 10 mg via INTRAVENOUS
  Filled 2024-06-29: qty 1

## 2024-06-29 MED ORDER — PIPERACILLIN-TAZOBACTAM 3.375 G IVPB 30 MIN
3.3750 g | Freq: Once | INTRAVENOUS | Status: AC
  Administered 2024-06-29: 3.375 g via INTRAVENOUS
  Filled 2024-06-29: qty 50

## 2024-06-29 MED ORDER — ADULT MULTIVITAMIN W/MINERALS CH
1.0000 | ORAL_TABLET | Freq: Every day | ORAL | Status: DC
  Filled 2024-06-29: qty 1

## 2024-06-29 MED ORDER — CHLORHEXIDINE GLUCONATE CLOTH 2 % EX PADS
6.0000 | MEDICATED_PAD | Freq: Every day | CUTANEOUS | Status: DC
  Administered 2024-06-29: 6 via TOPICAL

## 2024-06-29 MED ORDER — ROCURONIUM BROMIDE 10 MG/ML (PF) SYRINGE
PREFILLED_SYRINGE | INTRAVENOUS | Status: AC | PRN
  Administered 2024-06-29: 70 mg via INTRAVENOUS

## 2024-06-29 MED ORDER — ACETAMINOPHEN 325 MG PO TABS: 650.0000 mg | ORAL_TABLET | ORAL | Status: DC

## 2024-06-29 MED ORDER — ATORVASTATIN CALCIUM 10 MG PO TABS
20.0000 mg | ORAL_TABLET | Freq: Every day | ORAL | Status: DC
  Filled 2024-06-29: qty 2

## 2024-06-29 MED ORDER — VANCOMYCIN HCL 750 MG/150ML IV SOLN: 750.0000 mg | INTRAVENOUS | Status: DC

## 2024-06-29 MED ORDER — CALCIUM GLUCONATE-NACL 2-0.675 GM/100ML-% IV SOLN
2.0000 g | Freq: Once | INTRAVENOUS | Status: AC
  Administered 2024-06-29: 2000 mg via INTRAVENOUS
  Filled 2024-06-29: qty 100

## 2024-06-29 MED ORDER — EMPTY CONTAINERS FLEXIBLE MISC: 900.0000 mg | Freq: Once | Status: DC

## 2024-06-29 MED ORDER — FOLIC ACID 1 MG PO TABS: 1.0000 mg | ORAL_TABLET | Freq: Every morning | ORAL | Status: DC

## 2024-06-29 MED ORDER — HYDROCORTISONE SOD SUC (PF) 100 MG IJ SOLR
100.0000 mg | Freq: Two times a day (BID) | INTRAMUSCULAR | Status: DC
  Administered 2024-06-29 – 2024-06-30 (×2): 100 mg via INTRAVENOUS
  Filled 2024-06-29 (×2): qty 2

## 2024-06-29 MED ORDER — NOREPINEPHRINE 4 MG/250ML-% IV SOLN
INTRAVENOUS | Status: AC | PRN
  Administered 2024-06-29: 10 ug/min via INTRAVENOUS

## 2024-06-29 MED ORDER — PROPOFOL 1000 MG/100ML IV EMUL: 0.0000 ug/kg/min | INTRAVENOUS | Status: DC

## 2024-06-29 MED ORDER — FENTANYL 2500MCG IN NS 250ML (10MCG/ML) PREMIX INFUSION
0.0000 ug/h | INTRAVENOUS | Status: DC
  Filled 2024-06-29: qty 250

## 2024-06-29 MED ORDER — ACETAMINOPHEN 325 MG PO TABS: 650.0000 mg | ORAL_TABLET | ORAL | Status: DC | PRN

## 2024-06-29 MED ORDER — MIDODRINE HCL 5 MG PO TABS
5.0000 mg | ORAL_TABLET | Freq: Three times a day (TID) | ORAL | Status: DC
  Filled 2024-06-29: qty 1

## 2024-06-29 MED ORDER — IOHEXOL 350 MG/ML SOLN
75.0000 mL | Freq: Once | INTRAVENOUS | Status: AC | PRN
  Administered 2024-06-29: 75 mL via INTRAVENOUS

## 2024-06-29 MED ORDER — NOREPINEPHRINE BITARTRATE 1 MG/ML IV SOLN
INTRAVENOUS | Status: AC | PRN
Start: 2024-06-29 — End: 2024-06-29

## 2024-06-29 MED ORDER — ACETAMINOPHEN 650 MG RE SUPP: 650.0000 mg | RECTAL | Status: DC

## 2024-06-29 MED ORDER — POLYETHYLENE GLYCOL 3350 17 G PO PACK
17.0000 g | PACK | Freq: Every day | ORAL | Status: DC
  Filled 2024-06-29: qty 1

## 2024-06-29 MED ORDER — ACETAMINOPHEN 160 MG/5ML PO SOLN
650.0000 mg | ORAL | Status: DC
  Administered 2024-06-30 (×3): 650 mg
  Filled 2024-06-29 (×5): qty 20.3

## 2024-06-29 MED ORDER — SODIUM BICARBONATE 8.4 % IV SOLN
100.0000 meq | Freq: Once | INTRAVENOUS | Status: AC
  Administered 2024-06-29: 100 meq via INTRAVENOUS
  Filled 2024-06-29: qty 100

## 2024-06-29 MED ORDER — ACETAMINOPHEN 160 MG/5ML PO SOLN: 650.0000 mg | ORAL | Status: DC | PRN

## 2024-06-29 MED ORDER — FAMOTIDINE 20 MG PO TABS: 20.0000 mg | ORAL_TABLET | Freq: Two times a day (BID) | ORAL | Status: DC

## 2024-06-29 MED ORDER — SODIUM CHLORIDE 0.9 % IV BOLUS
500.0000 mL | Freq: Once | INTRAVENOUS | Status: AC
  Administered 2024-06-29: 500 mL via INTRAVENOUS

## 2024-06-29 MED ORDER — THIAMINE MONONITRATE 100 MG PO TABS
100.0000 mg | ORAL_TABLET | Freq: Every day | ORAL | Status: DC
  Filled 2024-06-29: qty 1

## 2024-06-29 MED ORDER — INSULIN ASPART 100 UNIT/ML IJ SOLN
0.0000 [IU] | INTRAMUSCULAR | Status: DC
  Administered 2024-06-30: 4 [IU] via SUBCUTANEOUS
  Administered 2024-06-30: 6 [IU] via SUBCUTANEOUS
  Administered 2024-06-30: 3 [IU] via SUBCUTANEOUS
  Filled 2024-06-29: qty 3
  Filled 2024-06-29: qty 4
  Filled 2024-06-29: qty 6
  Filled 2024-06-29: qty 1

## 2024-06-29 MED ORDER — NOREPINEPHRINE 4 MG/250ML-% IV SOLN
0.0000 ug/min | INTRAVENOUS | Status: DC
  Administered 2024-06-29: 40 ug/min via INTRAVENOUS
  Administered 2024-06-29: 10 ug/min via INTRAVENOUS
  Administered 2024-06-29 – 2024-06-30 (×7): 40 ug/min via INTRAVENOUS
  Filled 2024-06-29 (×7): qty 250

## 2024-06-29 MED ORDER — SODIUM CHLORIDE 0.9 % IV SOLN: 3.0000 g | Freq: Once | INTRAVENOUS | Status: DC

## 2024-06-29 NOTE — Code Documentation (Signed)
 Pt intubated Dr Trivan

## 2024-06-29 NOTE — Progress Notes (Signed)
RT attempted ABG with no success. MD made aware. ?

## 2024-06-29 NOTE — ED Provider Notes (Signed)
 [facility] Provider Note   CSN: [id number] Arrival date & time: 06/29/24  1758     Patient presents with: CPR in progress   [name] is a 68 y.o. male with history of end-stage renal disease presenting to ED in PEA arrest.  Supplemental history provided by EMS.  They report that patient was last seen normal about 30 minutes prior to being found unresponsive at [facility] nursing home.  At 5:15 PM, he was noted to be in PEA arrest on EMS arrival, CPR was initiated.  At 5:24 PM they had returned of circulation.  Patient received 1 dose of epinephrine , 25 g of D10, had a right sided IO placed as well as an i-gel for back ventilation.  Blood pressure was 112/70.  Patient was obtunded and unresponsive and route to the hospital.  EMS reports that he was full code   HPI     Prior to Admission medications   Medication Sig Start Date End Date Taking? Authorizing Provider  Amino Acids-Protein Hydrolys (PRO-STAT) LIQD Take 30 mLs by mouth 2 (two) times daily. (1000 & 1400)    [provider]  amiodarone  (PACERONE ) 200 MG tablet Take 1 tablet (200 mg total) by mouth daily. 10/15/22 07/31/24  McDiarmid, Krystal BIRCH, MD  apixaban  (ELIQUIS ) 2.5 MG TABS tablet Take 1 tablet (2.5 mg total) by mouth 2 (two) times daily. 04/25/24   Schuh, McKenzi P, PA-C  atorvastatin  (LIPITOR) 20 MG tablet Take 1 tablet (20 mg total) by mouth daily. Patient taking differently: Take 20 mg by mouth at bedtime. 12/17/19   Sherlean Failing, MD  bethanechol  (URECHOLINE ) 5 MG tablet Take 5 mg by mouth 3 (three) times daily. (0600, 1200 & 2000) 01/23/24   [provider]  clotrimazole -betamethasone (LOTRISONE) cream Apply 1 Application topically daily. Apply topically to rash every day shift.    [provider]  cyanocobalamin  1000 MCG tablet Take 1,000 mcg by mouth daily.    [provider]  ferric citrate  (AURYXIA ) 1 GM 210 MG(Fe) tablet Take 210 mg  by mouth with breakfast, with lunch, and with evening meal. 07/29/23   [provider]  folic acid  (FOLVITE ) 1 MG tablet Take 1 mg by mouth in the morning.    [provider]  hydrocortisone  cream 1 % Apply 1 Application topically daily as needed (rash). Applied to arm and elbow    [provider]  liver oil-zinc  oxide (DESITIN) 40 % ointment Apply 1 Application topically in the morning and at bedtime. Apply topically to buttocks and sacrum area every day and night shift for incontinence/preventative care.    [provider]  loperamide (IMODIUM) 2 MG capsule Take 2 mg by mouth as needed for diarrhea or loose stools.    [provider]  midodrine  (PROAMATINE ) 5 MG tablet Take 1 tablet (5 mg total) by mouth 3 (three) times daily with meals. 06/21/24 07/21/24  Leotis Bogus, MD  OXYGEN Inhale 2 L/min into the lungs as needed (shortness of breath or maintain sats > 90%).    [provider]  pantoprazole  (PROTONIX ) 40 MG tablet Take 1 tablet (40 mg total) by mouth 2 (two) times daily before a meal. 07/21/23   Caleen Qualia, MD  promethazine  (PHENERGAN ) 25 MG/ML injection Inject 25 mg into the muscle every 4 (four) hours as needed for nausea or vomiting.    [provider]  silver sulfADIAZINE (SILVADENE) 1 % cream Apply 1 Application topically 3 (three)  times daily. 06/07/24   [provider]  tamsulosin  (FLOMAX ) 0.4 MG CAPS capsule Take 0.4 mg by mouth every Monday, Wednesday, and Friday. Take one tablet by mouth 3 times a week on Monday, Wednesday and Fridays 12/20/22   [provider]    Allergies: Ultram  [tramadol ]    Review of Systems  Updated Vital Signs BP (!) 108/34   Pulse 84   Resp 18   SpO2 99%   Physical Exam Constitutional:      Comments: Obtunded, unresponsive Thin, emaciated appearance  HENT:     Head: Normocephalic and atraumatic.  Cardiovascular:     Rate and Rhythm: Normal rate and regular  rhythm.  Pulmonary:     Comments: Bag mask ventilation Abdominal:     General: There is no distension.     Tenderness: There is no abdominal tenderness.  Skin:    General: Skin is dry.     Comments: Cool extremity  Neurological:     Comments: GCS 3     (all labs ordered are listed, but only abnormal results are displayed) Labs Reviewed  I-STAT CHEM 8, ED - Abnormal; Notable for the following components:      Result Value   BUN 28 (*)    Creatinine, Ser 2.60 (*)    Glucose, Bld 157 (*)    Calcium , Ion 1.04 (*)    TCO2 17 (*)    Hemoglobin 12.2 (*)    HCT 36.0 (*)    All other components within normal limits  I-STAT CG4 LACTIC ACID, ED - Abnormal; Notable for the following components:   Lactic Acid, Venous 5.6 (*)    All other components within normal limits  CULTURE, BLOOD (ROUTINE X 2)  CULTURE, BLOOD (ROUTINE X 2)  BLOOD GAS, ARTERIAL  COMPREHENSIVE METABOLIC PANEL WITH GFR  CBC WITH DIFFERENTIAL/PLATELET  PROTIME-INR  URINALYSIS, W/ REFLEX TO CULTURE (INFECTION SUSPECTED)  CBG MONITORING, ED  I-STAT CG4 LACTIC ACID, ED  TROPONIN I (HIGH SENSITIVITY)    EKG: None  Radiology: No results found.   .Critical Care  Performed by: Cottie Donnice PARAS, MD Authorized by: Cottie Donnice PARAS, MD   Critical care provider statement:    Critical care time (minutes):  45   Critical care time was exclusive of:  Separately billable procedures and treating other patients   Critical care was time spent personally by me on the following activities:  Ordering and performing treatments and interventions, ordering and review of laboratory studies, ordering and review of radiographic studies, pulse oximetry, review of old charts, examination of patient and evaluation of patient's response to treatment Procedure Name: Intubation Date/Time: 06/29/2024 6:43 PM  Performed by: Cottie Donnice PARAS, MDPre-anesthesia Checklist: Patient identified, Patient being monitored, Emergency Drugs  available, Timeout performed and Suction available Oxygen Delivery Method: Non-rebreather mask Preoxygenation: Pre-oxygenation with 100% oxygen Induction Type: Rapid sequence Ventilation: Mask ventilation without difficulty Laryngoscope Size: Glidescope and 4 Tube size: 7.5 mm Number of attempts: 1 Airway Equipment and Method: Video-laryngoscopy Placement Confirmation: ETT inserted through vocal cords under direct vision, CO2 detector and Breath sounds checked- equal and bilateral Secured at: 25 cm Tube secured with: ETT holder    [employer]  Date/Time: 06/29/2024 6:43 PM  Performed by: Cottie Donnice PARAS, MD Authorized by: Cottie Donnice PARAS, MD   Consent:    Consent obtained:  Emergent situation Universal protocol:    Procedure explained and questions answered to patient or proxy's satisfaction: yes     Relevant documents present and  verified: yes     Test results available: yes     Imaging studies available: yes     Required blood products, implants, devices, and special equipment available: yes     Site/side marked: yes     Immediately prior to procedure, a time out was called: yes     Patient identity confirmed:  Arm band Pre-procedure details:    Indication(s): central venous access, hemodynamic monitoring and insufficient peripheral access     Hand hygiene: Hand hygiene performed prior to insertion     Sterile barrier technique: All elements of maximal sterile technique followed     Skin preparation:  Chlorhexidine  Sedation:    Sedation type:  None Anesthesia:    Anesthesia method:  None Procedure details:    Location:  R internal jugular   Patient position:  Supine   Procedural supplies:  Triple lumen   Landmarks identified: yes     Ultrasound guidance: yes     Ultrasound guidance timing: real time     Sterile ultrasound techniques: Sterile gel and sterile probe covers were used     Number of attempts:  1   Successful placement: yes   Post-procedure details:     Post-procedure:  Dressing applied and line sutured   Assessment:  Blood return through all ports, free fluid flow, no pneumothorax on x-ray and placement verified by x-ray   Procedure completion:  Tolerated well, no immediate complications    Medications Ordered in the ED  etomidate  (AMIDATE ) injection (10 mg Intravenous Given 06/29/24 1807)  rocuronium  (ZEMURON ) injection (70 mg Intravenous Given 06/29/24 1808)  norepinephrine  (LEVOPHED ) injection ( Intravenous Canceled Entry 06/29/24 1812)  norepinephrine  (LEVOPHED ) 4mg  in (0.016 mg/mL) premix infusion (20 mcg/min Intravenous Rate/Dose Change 06/29/24 1819)  sodium chloride  0.9 % bolus 500 mL (has no administration in time range)    Clinical Course as of 06/29/24 2121  Sat Jun 29, 2024  1841 Central line placed, beginning IV fluid 500 cc repletion, pt en route to CT scanner for CT head and PE rule out.  Bp improving on levophed  but still below MAP goal [MT]  1904 WBC(!): 21.1 [MT]  1904 Lactic Acid, Venous(!!): 5.6 [MT]  1904 BS antibiotics ordered [MT]  1939 No call back from ICU, I have paged to my phone.  [MT]  1945 ICU consulted, will come see patient [MT]  1948 Patient is pending ICU admit.  Troponin is elevated, likely from demand ischemia and prolonged CPR  [MT]    Clinical Course User Index [MT] Katlin Bortner, Donnice PARAS, MD                                 Medical Decision Making Amount and/or Complexity of Data Reviewed Labs: ordered. Decision-making details documented in ED Course. Radiology: ordered.  Risk Prescription drug management. Decision regarding hospitalization.   Patient is here in PEA arrest.  Differentials broad but would include metabolic derangement hyperkalemia versus PE versus arrhythmia versus other.  Patient has end-stage renal disease which is a risk factor for metabolic derangement.  Patient is also on Eliquis , which is bleeding risk.  I-STAT on arrival shows a hemoglobin of 12.2.  No clear  evidence of acute GI bleed or hypovolemia as a cause of symptoms.  Potassium is 4.2 on i-STAT on arrival.  Supple history provided by EMS.  On arrival the patient was intubated with a 7-1/2 ET tube.  We initially started  volume resuscitation and peripheral Levophed  through his intravenous access due to very limited and difficult peripheral access.  Subsequently after intubation and central line was placed and he was started on pressors through the central line.  He was taken for CT head and CT PE study, which was notable for likely aspiration event, chronic stable PEs.  No emergent intracranial finding  Broad-spectrum antibiotics ordered.  White blood cell count noted to be elevated as well as lactate.  Critical care was consulted.     Final diagnoses:  None    ED Discharge Orders     None          Cottie Donnice PARAS, MD 06/29/24 2122

## 2024-06-29 NOTE — H&P (Addendum)
 NAME:  [name], MRN:  [id number], DOB:  04-Jan-1956, LOS: 0 ADMISSION DATE:  06/29/2024, CONSULTATION DATE:  11/29 REFERRING MD:  Cottie FARBER CHIEF COMPLAINT: cardiac arrest    History of Present Illness:  68 year old male with past medical history of CHF, DM, ESRD on HD TTS, paroxysmal AF on Eliquis , hyperlipidemia, hypertension who presented from [facility] nursing home as cardiac arrest.   History obtained from EDP and chart review as patient intubated, no family present. Patient reportedly seen normal 30 minutes prior to being found unresponsive. Noted in PEA arrest at 5:15 PM with EMS and ACLS was initiated. ROSC at 5:24PM. Received 1 epi, D10. On arrival to ED, hypotensive, obtunded and was intubated. Labs notable for WBC 21.1, Hgb 11.2, plt 271, bicarb 16, BUN 24, sCr 3.16, AST 1219, ALT 528, alk phos 144, t bili 2.3, AG 33. Troponin 152. Initial lactic acid 5.6>4.2. CT head negative. 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 Was given 500cc IVF, vanc/zosyn  and started on levophed . CCM consulted for admission.  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  Pertinent  Medical History  CHF, DM, ESRD on HD TTS, paroxysmal AF on Eliquis , hyperlipidemia, hypertension  Significant Hospital Events: Including procedures, antibiotic start and stop dates in addition to other pertinent events   11/29: admit post PEA cardiac arrest   Interim History / Subjective:  Unable to participate in the subjective portion of the exam   Objective   Blood pressure (!) 108/34, pulse 84, temperature 98.4 F (36.9 C), temperature source Oral, resp. rate 18, SpO2 100%.    Vent Mode: PRVC FiO2 (%):  [60 %-100 %] 60 % Set Rate:  [18 bmp] 18 bmp Vt Set:  [440 mL] 440 mL PEEP:  [5 cmH20] 5  cmH20 Plateau Pressure:  [18 cmH20-19 cmH20] 18 cmH20   Intake/Output Summary (Last 24 hours) at 06/29/2024 2006 Last data filed at 06/29/2024 1953 Gross per 24 hour  Intake 563.13 ml  Output --  Net 563.13 ml   There were no vitals filed for this visit.  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 Unclear underlying cause. Query aspiration with respiratory arrest followed by cardiac arrest. Received 1 epi total. PE study negative. CT head negative. In SR on monitor. Elytes not grossly abnormal. Hemoglobin 11.  - admit to ICU for close monitoring - ABG now - TTM/normothermia protocol - levophed  for MAP > 65  - trend troponin - f/u Echo  - f/u spot EEG  - neuroprognostication at 72 hours unless he wakes up and is purposeful  - correct elytes, avoid coagulopathy   Mixed shock, cardiogenic and septic  Likely mixed cardiogenic and sepsis 2/2 aspiration pneumonia  - levophed  for MAP > 65  - obtain blood cultures, ua  and urine culture if he makes urine, tracheal aspirate - vancomycin /zosyn  antibiotic coverage  - trend lactate to clearance  - f/u echocardiogram  - if not improving shock can get coox   Acute hypoxic respiratory failure 2/2 above  Aspiration pneumonia  Aspiration vs pneumonia on CT chest. PE study negative.  - vancomycin , zosyn   - tracheal aspirate  - ABG now - full mechanical vent support - lung protective  ventilation 6-8cc/kg Vt - VAP and PAD bundle in place  - titrate FiO2 to sat goal >92  - maintain peak/plats <30, driving pressures <84    Shock liver  AST/ALT/Alk phos/Tbili 8780,471,855, 2.3 on presentation  - trend  - hepatitis panel   HFmrEF  PAF on Eliquis   Last echo 08/2023 EF 40-45%, LV global hypokinesis, trivial MR AR. On eliquis , amiodarone , metoprolol . Reportedly metoprolol  recently stopped due to hypotension from GIB  - tele monitor  - repeat echo now  - trend trop  - hold eliquis , start heparin  ppx   ESRD on HD TTS - consult nephrology  - trend bmp, mag, phos - replete elytes - strict I&O - f/u urine studies if he makes urine, unclear - will probably need urology to catheterize him if needed given gangrenous os  - Avoid nephrotoxic agents, renally dose medications - ensure adequate renal perfusion   High anion gap metabolic acidosis  AG 33. Not fully explained by lactic acidosis and BUN is only 24. Diabetic or starvation ketosis?  - obtain BHB, salicylate, tylenol   - if BHB is high will start endotool.  - trend   Diabetes  - A1c  - ssi for now  - cbg q4h  - may need endotool depending on above   LUE swelling  Was seen recently by vascular for concern of steal syndrome. LUE swollen compared to right  - LUE vas duplex   Anemia  Recent GIB  On eliquis   - no sign of GIB at this time  - PPI  - trend and transfuse prn   Hypertension  Hyperlipidemia  - no meds with shock  - can start atorvastatin  tomorrow   Protein calorie malnutrition  Appears frail and very malnourished  - start trickle feed  - thiamine, folic acid , mtvn  - dietary consult   Multiple wounds  - wound consult   Labs   CBC: Recent Labs  Lab 06/29/24 1811 06/29/24 1844  WBC  --  21.1*  NEUTROABS  --  19.7*  HGB 12.2* 11.2*  HCT 36.0* 37.6*  MCV  --  97.9  PLT  --  271    Basic Metabolic Panel: Recent Labs  Lab 06/29/24 1811 06/29/24 1844  NA 138 142  K 4.2 4.1   CL 102 93*  CO2  --  16*  GLUCOSE 157* 236*  BUN 28* 24*  CREATININE 2.60* 3.16*  CALCIUM   --  8.9   GFR: Estimated Creatinine Clearance: 17.8 mL/min (A) (by C-G formula based on SCr of 3.16 mg/dL (H)). Recent Labs  Lab 06/29/24 1813 06/29/24 1844 06/29/24 1952  WBC  --  21.1*  --   LATICACIDVEN 5.6*  --  4.2*    Liver Function Tests: Recent Labs  Lab 06/29/24 1844  AST 1,219*  ALT 528*  ALKPHOS 144*  BILITOT 2.3*  PROT 5.9*  ALBUMIN  1.9*   No results for input(s): LIPASE, AMYLASE in the last 168 hours. No results for input(s): AMMONIA in the last 168 hours.  ABG    Component Value Date/Time  HCO3 25.9 09/23/2022 1653   TCO2 17 (L) 06/29/2024 1811   ACIDBASEDEF 2.0 12/13/2019 1509   O2SAT 72.2 09/23/2022 1653     Coagulation Profile: Recent Labs  Lab 06/29/24 1844  INR 1.7*    Cardiac Enzymes: No results for input(s): CKTOTAL, CKMB, CKMBINDEX, TROPONINI in the last 168 hours.  HbA1C: Hgb A1c MFr Bld  Date/Time Value Ref Range Status  08/24/2023 09:49 PM 4.0 (L) 4.8 - 5.6 % Final    Comment:    (NOTE) Pre diabetes:          5.7%-6.4%  Diabetes:              >6.4%  Glycemic control for   <7.0% adults with diabetes   09/23/2022 01:26 AM 5.6 4.8 - 5.6 % Final    Comment:    (NOTE) Pre diabetes:          5.7%-6.4%  Diabetes:              >6.4%  Glycemic control for   <7.0% adults with diabetes     CBG: Recent Labs  Lab 06/29/24 1803  GLUCAP 78    Review of Systems:   As above   Past Medical History:  He,  has a past medical history of CHF (congestive heart failure) (HCC), Diabetes mellitus without complication (HCC), ESRD on hemodialysis (HCC), Hepatitis, Hyperlipemia, Hypertension, Necrotizing fasciitis (HCC), and Pneumonia.   Surgical History:   Past Surgical History:  Procedure Laterality Date   A/V FISTULAGRAM N/A 08/16/2023   Procedure: A/V Fistulagram;  Surgeon: Melia Lynwood ORN, MD;  Location: [facility] INVASIVE CV  LAB;  Service: Cardiovascular;  Laterality: N/A;   AMPUTATION Left 09/11/2013   Procedure: AMPUTATION BELOW KNEE;  Surgeon: Kay Ozell Cummins, MD;  Location: [facility] OR;  Service: Orthopedics;  Laterality: Left;   AMPUTATION Right 12/18/2013   Procedure: RIGHT FOOT 1,2, TOE AMPUTATION  5th toe RAY AMPUTATION;  Surgeon: Kay Ozell Cummins, MD;  Location: [facility] OR;  Service: Orthopedics;  Laterality: Right;   ARTERY REPAIR Left 04/23/2024   Procedure: PLICATION OF AV FISTULA;  Surgeon: Lanis Fonda BRAVO, MD;  Location: [facility] OR;  Service: Vascular;  Laterality: Left;   AV FISTULA PLACEMENT Left 12/11/2019   Procedure: LEFT BRACHIOCEPHALIC FISTULA CREATION;  Surgeon: Oris Krystal FALCON, MD;  Location: [facility] OR;  Service: Vascular;  Laterality: Left;   BIOPSY  07/19/2023   Procedure: BIOPSY;  Surgeon: Albertus Gordy HERO, MD;  Location: [facility] ENDOSCOPY;  Service: Gastroenterology;;   CHOLECYSTECTOMY     ESOPHAGOGASTRODUODENOSCOPY N/A 06/15/2024   Procedure: EGD (ESOPHAGOGASTRODUODENOSCOPY);  Surgeon: Rollin Dover, MD;  Location: [facility] ENDOSCOPY;  Service: Gastroenterology;  Laterality: N/A;   ESOPHAGOGASTRODUODENOSCOPY (EGD) WITH PROPOFOL  N/A 07/19/2023   Procedure: ESOPHAGOGASTRODUODENOSCOPY (EGD) WITH PROPOFOL ;  Surgeon: Albertus Gordy HERO, MD;  Location: [facility] ENDOSCOPY;  Service: Gastroenterology;  Laterality: N/A;   HOT HEMOSTASIS N/A 07/19/2023   Procedure: HOT HEMOSTASIS (ARGON PLASMA COAGULATION/BICAP);  Surgeon: Albertus Gordy HERO, MD;  Location: [facility] ENDOSCOPY;  Service: Gastroenterology;  Laterality: N/A;  stomach   I & D EXTREMITY Left 09/03/2013   Procedure: IRRIGATION AND DEBRIDEMENT EXTREMITY;  Surgeon: Kay Ozell Cummins, MD;  Location: [facility] OR;  Service: Orthopedics;  Laterality: Left;   I & D EXTREMITY Left 09/11/2013   Procedure: LEFT FOOT IRRIGATION AND DEBRIDEMENT;  Surgeon: Kay Ozell Cummins, MD;  Location: [facility] OR;  Service: Orthopedics;  Laterality: Left;   INSERTION OF DIALYSIS CATHETER  12/11/2019   Procedure: Insertion Of  Dialysis Catheter;  Surgeon: Oris Krystal FALCON, MD;  Location: [facility] OR;  Service: Vascular;;   INSERTION OF DIALYSIS CATHETER Right 07/11/2022   Procedure: INSERTION OF TUNNELED DIALYSIS CATHETER;  Surgeon: Eliza Lonni RAMAN, MD;  Location: [facility] OR;  Service: Vascular;  Laterality: Right;   LEFT HEART CATH AND CORONARY ANGIOGRAPHY N/A 12/13/2019   Procedure: LEFT HEART CATH AND CORONARY ANGIOGRAPHY;  Surgeon: Dann Candyce RAMAN, MD;  Location: [facility] INVASIVE CV LAB;  Service: Cardiovascular;  Laterality: N/A;   PERIPHERAL VASCULAR BALLOON ANGIOPLASTY Left 08/16/2023   Procedure: PERIPHERAL VASCULAR BALLOON ANGIOPLASTY;  Surgeon: Melia Lynwood ORN, MD;  Location: [facility] INVASIVE CV LAB;  Service: Cardiovascular;  Laterality: Left;   REVISON OF ARTERIOVENOUS FISTULA Left 07/11/2022   Procedure: REVISON OF LEFT ARM ARTERIOVENOUS FISTULA;  Surgeon: Eliza Lonni RAMAN, MD;  Location: [facility] OR;  Service: Vascular;  Laterality: Left;   REVISON OF ARTERIOVENOUS FISTULA Left 04/23/2024   Procedure: REVISON OF ARTERIOVENOUS FISTULA;  Surgeon: Lanis Fonda BRAVO, MD;  Location: [facility] OR;  Service: Vascular;  Laterality: Left;   RIGHT HEART CATH N/A 12/13/2019   Procedure: RIGHT HEART CATH;  Surgeon: Dann Candyce RAMAN, MD;  Location: [facility] INVASIVE CV LAB;  Service: Cardiovascular;  Laterality: N/A;     Social History:   reports that he has never smoked. He has never used smokeless tobacco. He reports that he does not drink alcohol  and does not use drugs.   Family History:  His family history includes Dementia in his mother; Diabetes type II in his mother; Heart disease in his father.   Allergies Allergies  Allergen Reactions   Ultram  [Tramadol ] Nausea And Vomiting     Home Medications  Prior to Admission medications   Medication Sig Start Date End Date Taking? Authorizing Provider  Amino Acids-Protein Hydrolys (PRO-STAT) LIQD Take 30 mLs by mouth 2 (two) times daily. (1000 & 1400)    [provider]   amiodarone  (PACERONE ) 200 MG tablet Take 1 tablet (200 mg total) by mouth daily. 10/15/22 07/31/24  McDiarmid, Krystal BIRCH, MD  apixaban  (ELIQUIS ) 2.5 MG TABS tablet Take 1 tablet (2.5 mg total) by mouth 2 (two) times daily. 04/25/24   Schuh, McKenzi P, PA-C  atorvastatin  (LIPITOR) 20 MG tablet Take 1 tablet (20 mg total) by mouth daily. Patient taking differently: Take 20 mg by mouth at bedtime. 12/17/19   Sherlean Failing, MD  bethanechol  (URECHOLINE ) 5 MG tablet Take 5 mg by mouth 3 (three) times daily. (0600, 1200 & 2000) 01/23/24   [provider]  clotrimazole -betamethasone (LOTRISONE) cream Apply 1 Application topically daily. Apply topically to rash every day shift.    [provider]  cyanocobalamin  1000 MCG tablet Take 1,000 mcg by mouth daily.    [provider]  ferric citrate  (AURYXIA ) 1 GM 210 MG(Fe) tablet Take 210 mg by mouth with breakfast, with lunch, and with evening meal. 07/29/23   [provider]  folic acid  (FOLVITE ) 1 MG tablet Take 1 mg by mouth in the morning.    [provider]  hydrocortisone  cream 1 % Apply 1 Application topically daily as needed (rash). Applied to arm and elbow    [provider]  liver oil-zinc  oxide (DESITIN) 40 % ointment Apply 1 Application topically in the morning and at bedtime. Apply topically to buttocks and sacrum area every day and night shift for incontinence/preventative care.    [provider]  loperamide (IMODIUM) 2 MG capsule Take 2 mg by mouth as needed for diarrhea or loose stools.  [provider]  midodrine  (PROAMATINE ) 5 MG tablet Take 1 tablet (5 mg total) by mouth 3 (three) times daily with meals. 06/21/24 07/21/24  Leotis Bogus, MD  OXYGEN Inhale 2 L/min into the lungs as needed (shortness of breath or maintain sats > 90%).    [provider]  pantoprazole  (PROTONIX ) 40 MG tablet Take 1 tablet (40 mg total) by mouth 2 (two) times daily before a meal.  07/21/23   Caleen Qualia, MD  promethazine  (PHENERGAN ) 25 MG/ML injection Inject 25 mg into the muscle every 4 (four) hours as needed for nausea or vomiting.    [provider]  silver sulfADIAZINE (SILVADENE) 1 % cream Apply 1 Application topically 3 (three) times daily. 06/07/24   [provider]  tamsulosin  (FLOMAX ) 0.4 MG CAPS capsule Take 0.4 mg by mouth every Monday, Wednesday, and Friday. Take one tablet by mouth 3 times a week on Monday, Wednesday and Fridays 12/20/22   [provider]     Critical care time: 66   Tinnie FORBES Adolph DEVONNA [facility] 06/29/24 8:45 PM  Please see [email or web address] for pager details.  From 7A-7P if no response, please call [phone] After hours, please call ELink [phone]

## 2024-06-29 NOTE — ED Notes (Signed)
 Patient had BM. Cleaned, new brief applied, new sacral pad applied.

## 2024-06-29 NOTE — ED Notes (Signed)
 [name] [phone] would like an update asap

## 2024-06-29 NOTE — Progress Notes (Signed)
 ED Pharmacy Antibiotic Sign Off An antibiotic consult was received from an ED provider for zosyn  and vancomycin  per pharmacy dosing for sepsis. A chart review was completed to assess appropriateness.   The following one time order(s) were placed:  Zosyn  3.375g IV x 1 over 30 min Vancomycin  1g IV x 1  Further antibiotic and/or antibiotic pharmacy consults should be ordered by the admitting provider if indicated.   Thank you for allowing pharmacy to be a part of this patient's care.   Dorn Poot, [facility]  Clinical Pharmacist 06/29/24 7:10 PM

## 2024-06-29 NOTE — Progress Notes (Signed)
Patient transported to 3M06 from ED without complications. RN at bedside.

## 2024-06-29 NOTE — ED Notes (Signed)
 Unable to insert foley catheter due to patient's anatomy.

## 2024-06-29 NOTE — IPAL (Signed)
  Interdisciplinary Goals of Care Family Meeting   Date carried out: 06/29/2024  Location of the meeting: Phone conference  Member's involved: Family Member or next of kin  Durable Power of Attorney or acting medical decision maker: Tammy Ains, friend    Discussion: We discussed goals of care for [name] . Discussed admission for PEA arrest. Reviewed events leading up to admission. At current on increasing pressor requirements and peri-arrest again. She is aware of his admission. She does state both parents are deceased, no children, he is not married, has one estranged sibling. She states on his last admission they were working with chaplain regarding POA paperwork but that was not complete. She has been his decision maker however. I recommended no further CPR if he were to arrest again. She is in agreement with that. States he has been declining for some time. Will continue current care with DNR-Limited. I encouraged her to come visit if she is able.   Code status:   Code Status: Limited: Do not attempt resuscitation (DNR) -DNR-LIMITED -Do Not Intubate/DNI    Disposition: Continue current acute care  Time spent for the meeting: 25  Tinnie FORBES Furth, PA-C [place] 06/29/24 9:36 PM  Please see [email or web address] for pager details.  From 7A-7P if no response, please call [phone] After hours, please call ELink [phone]

## 2024-06-29 NOTE — Progress Notes (Signed)
 Pharmacy Antibiotic Note  [name] is a 68 y.o. male admitted on 06/29/2024 post cardiac arrest with concerns for sepsis. Pharmacy has been consulted for vancomycin  and Zosyn  dosing. First doses given in ER ~2000.  Plan: Zosyn  3.375g IV EI q8h Vancomycin  750mg  IV q48h - est AUC 486 Follow Cr, LOT, Cx Vancomycin  levels PRN     Temp (24hrs), Avg:97.9 F (36.6 C), Min:97.4 F (36.3 C), Max:98.4 F (36.9 C)  Recent Labs  Lab 06/29/24 1811 06/29/24 1813 06/29/24 1844 06/29/24 1952 06/29/24 2005  WBC  --   --  21.1*  --   --   CREATININE 2.60*  --  3.16*  --  2.50*  LATICACIDVEN  --  5.6*  --  4.2*  --     Estimated Creatinine Clearance: 22.4 mL/min (A) (by C-G formula based on SCr of 2.5 mg/dL (H)).    Allergies  Allergen Reactions   Ultram  [Tramadol ] Nausea And Vomiting    Antimicrobials this admission: Zosyn  11/29 >>  Vancomycin  11/29 >>   Dose adjustments this admission: none  Microbiology results: Pending  [name], PharmD, BCPS, [facility] Clinical Pharmacist [phone] Please check AMION for all [facility] Pharmacy numbers 06/29/2024

## 2024-06-29 NOTE — Progress Notes (Signed)
 PHARMACY - ANTICOAGULATION CONSULT NOTE  Pharmacy Consult for heparin  Indication: atrial fibrillation  Allergies  Allergen Reactions   Ultram  [Tramadol ] Nausea And Vomiting    Patient Measurements:    Vital Signs: Temp: 97.4 F (36.3 C) (11/29 2041) Temp Source: Oral (11/29 2041) BP: 132/35 (11/29 2041) Pulse Rate: 76 (11/29 2041)  Labs: Recent Labs    06/29/24 1811 06/29/24 1844 06/29/24 2005  HGB 12.2* 11.2* 11.6*  HCT 36.0* 37.6* 34.0*  PLT  --  271  --   LABPROT  --  20.4*  --   INR  --  1.7*  --   CREATININE 2.60* 3.16* 2.50*  TROPONINIHS  --  152*  --     Estimated Creatinine Clearance: 22.4 mL/min (A) (by C-G formula based on SCr of 2.5 mg/dL (H)).   Medical History: Past Medical History:  Diagnosis Date   CHF (congestive heart failure) (HCC)    Diabetes mellitus without complication (HCC)    PATIENT JUST LEARNED HE WAS DIABETIC   ESRD on hemodialysis (HCC)    TTS at [facility]   Hepatitis    Hyperlipemia    Hypertension    Necrotizing fasciitis (HCC)    Pneumonia    HX OF PNA      Assessment: 89 yoM admitted with cardiac arrest. Pt on apixaban  PTA for hx AFib, pharmacy to dose IV heparin . Last dose of apixaban  unknown. CBC low but similar to baseline.  Goal of Therapy:  Heparin  level 0.3-0.7 units/ml aPTT 66-102 seconds Monitor platelets by anticoagulation protocol: Yes   Plan:  Heparin  800 units/h no bolus Check aPTT and heparin  level in 8h  [name], PharmD, [place], [facility] Clinical Pharmacist [phone] Please check AMION for all [facility] Pharmacy numbers 06/29/2024

## 2024-06-29 NOTE — Progress Notes (Signed)
 PT was taken to CT scan and back to Trauma C without complications.

## 2024-06-29 NOTE — Progress Notes (Signed)
 ETT tube retracted 3cm per MD. ETT tube is now at 22 at the lip. Xray at bedside.

## 2024-06-29 NOTE — Progress Notes (Addendum)
 eLink Physician-Brief Progress Note Patient Name: Vinal Rosengrant DOB: [birth year] MRN: [id number]   Date of Service  06/29/2024  HPI/Events of Note  eICU Brief new admit note: 68 year old male with past medical history of CHF, DM, ESRD on HD TTS, paroxysmal AF on Eliquis , hyperlipidemia, hypertension who presented from [facility] nursing home as PEA cardiac arrest.  Shock cardiac/septic.  ROSC 15 mis. nn ventilator. Admission 11/13-11/21/2025 for GIB EGD with no acute finding, shock liver. HFrEF. PAF, eliquis  on hold. High AG metabolic acidosis.    Camera: In synchrony with lung protective ventilation. Left BKA, right heal pressure wound. VS stable. Central line site oozing.   Data: WBC 21.1, Hgb 11.2, plt 271, bicarb 16, BUN 24, sCr 3.16, AST 1219, ALT 528, alk phos 144, t bili 2.3, AG 33. Troponin 152. Initial lactic acid 5.6>4.2. CT head negative. CT PE study negative for acute PE,   Cxr image seen. Left lower ? Aspiration pneumonitis      eICU Interventions  - ground team at bed side, to address central line oozing. - trend Hg/hct. - might need eliquis  reversal if bleeding does not get controlled. - Cbg goals < 180 - on antibiotics.   Jodelle Hutching, MD [phone]       Intervention Category Major Interventions: Respiratory failure - evaluation and management;Hypotension - evaluation and management Evaluation Type: New Patient Evaluation  Jodelle ONEIDA Hutching 06/29/2024, 9:37 PM  00:07 Secure chat from PA. Eliquis  reversal being ordered from bed side.  00:53 Camera high alert Tamy long time care taker/friend at bed side.  He has a estranged sister who is next of kin.  Maxed out on 2 pressor.  Received 100 mcg sodium bicarb an hour ago and started on drip few mins ago, MAP 41.  Discussed about multi organ down. Risk and benefit of 3 rd pressor. Guarded critical situation. - stat Bicarb 50 mcg. Latest SBP 90. - 3 rd pressor neo synephrine started.  Will notify ground  team also.   01:07 Above sent to PA Lauren. Lauren has discussed with sister, and she is going to discontinue neo synephrine. He might not make it to day time.   06:39 Hyperglycemia .  Sodium bicarb changed to sterile water .  Follow glucose

## 2024-06-29 NOTE — Progress Notes (Addendum)
 Patient maxed on levo vaso. I added stress steroids. He has oozing from CVC line that has been persistent. Attempted surgicel dressing which has not improved bleeding. Will reverse his eliquis  with Andexxa 900mg  now and give TXA. Trend h/h. Getting 2 amp bicarb now for acidosis and vasoplegia and will place on drip. Would not tolerate CRRT at this time with hypotension.

## 2024-06-29 NOTE — ED Triage Notes (Signed)
 Pt to ED via [facility] from [facility], pt found unresponsive by facility staff at 1710, was seen at baseline 30 mins prior.  Fire arrived 1715- PEA rate 30- CPR 1729- ROSC   No purposeful movements  IO R tibia, igel   Medications given by EMS: 1 EPI NA 1gr D50   Last VS: 117/70, 92HR, NSR, 75%, CBG 59

## 2024-06-30 ENCOUNTER — Inpatient Hospital Stay (HOSPITAL_COMMUNITY)

## 2024-06-30 DIAGNOSIS — R569 Unspecified convulsions: Secondary | ICD-10-CM | POA: Diagnosis not present

## 2024-06-30 DIAGNOSIS — I469 Cardiac arrest, cause unspecified: Secondary | ICD-10-CM | POA: Diagnosis not present

## 2024-06-30 LAB — BLOOD GAS, VENOUS
Acid-base deficit: 4.4 mmol/L — ABNORMAL HIGH (ref 0.0–2.0)
Acid-base deficit: 9.4 mmol/L — ABNORMAL HIGH (ref 0.0–2.0)
Bicarbonate: 21 mmol/L (ref 20.0–28.0)
Bicarbonate: 18.4 mmol/L — ABNORMAL LOW (ref 20.0–28.0)
O2 Saturation: 61.8 %
O2 Saturation: 41.4 %
Patient temperature: 36.1
Patient temperature: 35.7
pCO2, Ven: 38 mmHg — ABNORMAL LOW (ref 44–60)
pCO2, Ven: 46 mmHg (ref 44–60)
pH, Ven: 7.35 (ref 7.25–7.43)
pH, Ven: 7.21 — ABNORMAL LOW (ref 7.25–7.43)
pO2, Ven: 37 mmHg (ref 32–45)
pO2, Ven: 38 mmHg (ref 32–45)

## 2024-06-30 LAB — COMPREHENSIVE METABOLIC PANEL WITH GFR
ALT: 252 U/L — ABNORMAL HIGH (ref 0–44)
AST: 477 U/L — ABNORMAL HIGH (ref 15–41)
Albumin: <1.5 g/dL — ABNORMAL LOW (ref 3.5–5.0)
Alkaline Phosphatase: 82 U/L (ref 38–126)
Anion gap: 30 — ABNORMAL HIGH (ref 5–15)
BUN: 30 mg/dL — ABNORMAL HIGH (ref 8–23)
CO2: 18 mmol/L — ABNORMAL LOW (ref 22–32)
Calcium: 7 mg/dL — ABNORMAL LOW (ref 8.9–10.3)
Chloride: 88 mmol/L — ABNORMAL LOW (ref 98–111)
Creatinine, Ser: 2.88 mg/dL — ABNORMAL HIGH (ref 0.61–1.24)
GFR, Estimated: 23 mL/min — ABNORMAL LOW (ref >60)
Glucose, Bld: 557 mg/dL (ref 70–99)
Potassium: 3.4 mmol/L — ABNORMAL LOW (ref 3.5–5.1)
Sodium: 136 mmol/L (ref 135–145)
Total Bilirubin: 1 mg/dL (ref 0.0–1.2)
Total Protein: 3.7 g/dL — ABNORMAL LOW (ref 6.5–8.1)

## 2024-06-30 LAB — CBC
HCT: 23.1 % — ABNORMAL LOW (ref 39.0–52.0)
HCT: 26.6 % — ABNORMAL LOW (ref 39.0–52.0)
Hemoglobin: 7.3 g/dL — ABNORMAL LOW (ref 13.0–17.0)
Hemoglobin: 8.5 g/dL — ABNORMAL LOW (ref 13.0–17.0)
MCH: 29.8 pg (ref 26.0–34.0)
MCH: 29.6 pg (ref 26.0–34.0)
MCHC: 31.6 g/dL (ref 30.0–36.0)
MCHC: 32 g/dL (ref 30.0–36.0)
MCV: 94.3 fL (ref 80.0–100.0)
MCV: 92.7 fL (ref 80.0–100.0)
Platelets: 233 K/uL (ref 150–400)
Platelets: 145 K/uL — ABNORMAL LOW (ref 150–400)
RBC: 2.45 MIL/uL — ABNORMAL LOW (ref 4.22–5.81)
RBC: 2.87 MIL/uL — ABNORMAL LOW (ref 4.22–5.81)
RDW: 18.6 % — ABNORMAL HIGH (ref 11.5–15.5)
RDW: 18.7 % — ABNORMAL HIGH (ref 11.5–15.5)
WBC: 20.9 K/uL — ABNORMAL HIGH (ref 4.0–10.5)
WBC: 14.3 K/uL — ABNORMAL HIGH (ref 4.0–10.5)
nRBC: 0.5 % — ABNORMAL HIGH (ref 0.0–0.2)
nRBC: 1.5 % — ABNORMAL HIGH (ref 0.0–0.2)

## 2024-06-30 LAB — ECHOCARDIOGRAM COMPLETE
Area-P 1/2: 4.96 cm2
Calc EF: 13.6 %
S' Lateral: 3.9 cm
Single Plane A2C EF: 10.4 %
Single Plane A4C EF: 16.9 %
Weight: 1908.3 [oz_av]

## 2024-06-30 LAB — TRIGLYCERIDES: Triglycerides: 83 mg/dL (ref <150)

## 2024-06-30 LAB — BLOOD CULTURE ID PANEL (REFLEXED) - BCID2
A.calcoaceticus-baumannii: DETECTED — AB
Bacteroides fragilis: NOT DETECTED
CTX-M ESBL: NOT DETECTED
Candida albicans: NOT DETECTED
Candida auris: NOT DETECTED
Candida glabrata: NOT DETECTED
Candida krusei: NOT DETECTED
Candida parapsilosis: NOT DETECTED
Candida tropicalis: NOT DETECTED
Carbapenem resistance IMP: NOT DETECTED
Carbapenem resistance KPC: NOT DETECTED
Carbapenem resistance NDM: NOT DETECTED
Carbapenem resistance VIM: NOT DETECTED
Cryptococcus neoformans/gattii: NOT DETECTED
Enterobacter cloacae complex: NOT DETECTED
Enterobacterales: NOT DETECTED
Enterococcus Faecium: NOT DETECTED
Enterococcus faecalis: NOT DETECTED
Escherichia coli: NOT DETECTED
Haemophilus influenzae: NOT DETECTED
Klebsiella aerogenes: NOT DETECTED
Klebsiella oxytoca: NOT DETECTED
Klebsiella pneumoniae: NOT DETECTED
Listeria monocytogenes: NOT DETECTED
Neisseria meningitidis: NOT DETECTED
Proteus species: NOT DETECTED
Pseudomonas aeruginosa: NOT DETECTED
Salmonella species: NOT DETECTED
Serratia marcescens: NOT DETECTED
Staphylococcus aureus (BCID): NOT DETECTED
Staphylococcus epidermidis: NOT DETECTED
Staphylococcus lugdunensis: NOT DETECTED
Staphylococcus species: DETECTED — AB
Stenotrophomonas maltophilia: NOT DETECTED
Streptococcus agalactiae: NOT DETECTED
Streptococcus pneumoniae: NOT DETECTED
Streptococcus pyogenes: NOT DETECTED
Streptococcus species: NOT DETECTED

## 2024-06-30 LAB — PROTIME-INR
INR: 3.1 — ABNORMAL HIGH (ref 0.8–1.2)
Prothrombin Time: 33.2 s — ABNORMAL HIGH (ref 11.4–15.2)

## 2024-06-30 LAB — GLUCOSE, CAPILLARY
Glucose-Capillary: 311 mg/dL — ABNORMAL HIGH (ref 70–99)
Glucose-Capillary: 446 mg/dL — ABNORMAL HIGH (ref 70–99)
Glucose-Capillary: 424 mg/dL — ABNORMAL HIGH (ref 70–99)
Glucose-Capillary: 268 mg/dL — ABNORMAL HIGH (ref 70–99)
Glucose-Capillary: 161 mg/dL — ABNORMAL HIGH (ref 70–99)

## 2024-06-30 LAB — HEPATIC FUNCTION PANEL
ALT: 259 U/L — ABNORMAL HIGH (ref 0–44)
AST: 482 U/L — ABNORMAL HIGH (ref 15–41)
Albumin: <1.5 g/dL — ABNORMAL LOW (ref 3.5–5.0)
Alkaline Phosphatase: 87 U/L (ref 38–126)
Bilirubin, Direct: 0.4 mg/dL — ABNORMAL HIGH (ref 0.0–0.2)
Indirect Bilirubin: 0.7 mg/dL (ref 0.3–0.9)
Total Bilirubin: 1.1 mg/dL (ref 0.0–1.2)
Total Protein: 3.7 g/dL — ABNORMAL LOW (ref 6.5–8.1)

## 2024-06-30 LAB — APTT
aPTT: 42 s — ABNORMAL HIGH (ref 24–36)
aPTT: 49 s — ABNORMAL HIGH (ref 24–36)

## 2024-06-30 LAB — HEPATITIS PANEL, ACUTE
HCV Ab: NONREACTIVE
Hep A IgM: NONREACTIVE
Hep B C IgM: NONREACTIVE
Hepatitis B Surface Ag: NONREACTIVE

## 2024-06-30 LAB — ACETAMINOPHEN LEVEL: Acetaminophen (Tylenol), Serum: <10 ug/mL — ABNORMAL LOW (ref 10–30)

## 2024-06-30 LAB — PHOSPHORUS: Phosphorus: 4.8 mg/dL — ABNORMAL HIGH (ref 2.5–4.6)

## 2024-06-30 LAB — TYPE AND SCREEN
ABO/RH(D): O NEG
Antibody Screen: NEGATIVE

## 2024-06-30 LAB — BASIC METABOLIC PANEL WITH GFR
Anion gap: 29 — ABNORMAL HIGH (ref 5–15)
BUN: 29 mg/dL — ABNORMAL HIGH (ref 8–23)
CO2: 17 mmol/L — ABNORMAL LOW (ref 22–32)
Calcium: 7.2 mg/dL — ABNORMAL LOW (ref 8.9–10.3)
Chloride: 89 mmol/L — ABNORMAL LOW (ref 98–111)
Creatinine, Ser: 2.81 mg/dL — ABNORMAL HIGH (ref 0.61–1.24)
GFR, Estimated: 24 mL/min — ABNORMAL LOW (ref >60)
Glucose, Bld: 548 mg/dL (ref 70–99)
Potassium: 3.7 mmol/L (ref 3.5–5.1)
Sodium: 135 mmol/L (ref 135–145)

## 2024-06-30 LAB — SALICYLATE LEVEL: Salicylate Lvl: <7 mg/dL — ABNORMAL LOW (ref 7.0–30.0)

## 2024-06-30 LAB — MAGNESIUM: Magnesium: 1.7 mg/dL (ref 1.7–2.4)

## 2024-06-30 LAB — HEMOGLOBIN AND HEMATOCRIT, BLOOD

## 2024-06-30 LAB — LACTIC ACID, PLASMA: Lactic Acid, Venous: >9 mmol/L (ref 0.5–1.9)

## 2024-06-30 LAB — BETA-HYDROXYBUTYRIC ACID: Beta-Hydroxybutyric Acid: 0.7 mmol/L — ABNORMAL HIGH (ref 0.05–0.27)

## 2024-06-30 MED ORDER — DOCUSATE SODIUM 50 MG/5ML PO LIQD
100.0000 mg | Freq: Two times a day (BID) | ORAL | Status: DC
  Administered 2024-06-30: 100 mg
  Filled 2024-06-30: qty 10

## 2024-06-30 MED ORDER — MIDAZOLAM HCL (PF) 2 MG/2ML IJ SOLN: 1.0000 mg | INTRAMUSCULAR | Status: DC | PRN

## 2024-06-30 MED ORDER — ORAL CARE MOUTH RINSE: 15.0000 mL | OROMUCOSAL | Status: DC | PRN

## 2024-06-30 MED ORDER — VANCOMYCIN VARIABLE DOSE PER UNSTABLE RENAL FUNCTION (PHARMACIST DOSING): Status: DC

## 2024-06-30 MED ORDER — FENTANYL CITRATE (PF) 50 MCG/ML IJ SOSY: 50.0000 ug | PREFILLED_SYRINGE | INTRAMUSCULAR | Status: DC | PRN

## 2024-06-30 MED ORDER — FENTANYL BOLUS VIA INFUSION: 25.0000 ug | INTRAVENOUS | Status: DC | PRN

## 2024-06-30 MED ORDER — SODIUM CHLORIDE 0.9 % IV SOLN
500.0000 mg | Freq: Two times a day (BID) | INTRAVENOUS | Status: DC
  Filled 2024-06-30: qty 10

## 2024-06-30 MED ORDER — ACETAMINOPHEN 325 MG PO TABS: 650.0000 mg | ORAL_TABLET | Freq: Four times a day (QID) | ORAL | Status: DC | PRN

## 2024-06-30 MED ORDER — INSULIN ASPART 100 UNIT/ML IJ SOLN
20.0000 [IU] | Freq: Once | INTRAMUSCULAR | Status: AC
  Administered 2024-06-30: 20 [IU] via SUBCUTANEOUS

## 2024-06-30 MED ORDER — NOREPINEPHRINE 16 MG/250ML-% IV SOLN
0.0000 ug/min | INTRAVENOUS | Status: DC
  Administered 2024-06-30 (×2): 40 ug/min via INTRAVENOUS
  Filled 2024-06-30 (×2): qty 250

## 2024-06-30 MED ORDER — THIAMINE MONONITRATE 100 MG PO TABS
100.0000 mg | ORAL_TABLET | Freq: Every day | ORAL | Status: DC
  Administered 2024-06-30: 100 mg
  Filled 2024-06-30: qty 1

## 2024-06-30 MED ORDER — POTASSIUM CHLORIDE 10 MEQ/100ML IV SOLN
10.0000 meq | INTRAVENOUS | Status: AC
  Administered 2024-06-30 (×3): 10 meq via INTRAVENOUS
  Filled 2024-06-30 (×3): qty 100

## 2024-06-30 MED ORDER — FENTANYL CITRATE (PF) 50 MCG/ML IJ SOSY: 25.0000 ug | PREFILLED_SYRINGE | Freq: Once | INTRAMUSCULAR | Status: DC

## 2024-06-30 MED ORDER — ACETAMINOPHEN 650 MG RE SUPP: 650.0000 mg | Freq: Four times a day (QID) | RECTAL | Status: DC | PRN

## 2024-06-30 MED ORDER — INSULIN ASPART 100 UNIT/ML IJ SOLN
0.0000 [IU] | INTRAMUSCULAR | Status: DC
  Administered 2024-06-30: 9 [IU] via SUBCUTANEOUS
  Filled 2024-06-30: qty 9
  Filled 2024-06-30: qty 4

## 2024-06-30 MED ORDER — SODIUM BICARBONATE 8.4 % IV SOLN
50.0000 meq | Freq: Once | INTRAVENOUS | Status: AC
  Administered 2024-06-30: 50 meq via INTRAVENOUS

## 2024-06-30 MED ORDER — GLYCOPYRROLATE 0.2 MG/ML IJ SOLN: 0.2000 mg | INTRAMUSCULAR | Status: DC | PRN

## 2024-06-30 MED ORDER — SODIUM CHLORIDE 0.9 % IV SOLN: INTRAVENOUS | Status: DC

## 2024-06-30 MED ORDER — INSULIN ASPART 100 UNIT/ML IJ SOLN
0.0000 [IU] | INTRAMUSCULAR | Status: DC
  Administered 2024-06-30: 11 [IU] via SUBCUTANEOUS
  Administered 2024-06-30: 4 [IU] via SUBCUTANEOUS
  Filled 2024-06-30: qty 11
  Filled 2024-06-30: qty 4

## 2024-06-30 MED ORDER — THIAMINE HCL 100 MG/ML IJ SOLN
100.0000 mg | Freq: Every day | INTRAMUSCULAR | Status: DC
  Administered 2024-06-30: 100 mg via INTRAVENOUS
  Filled 2024-06-30: qty 2

## 2024-06-30 MED ORDER — STERILE WATER FOR INJECTION IV SOLN
INTRAVENOUS | Status: DC
  Filled 2024-06-30: qty 1000
  Filled 2024-06-30: qty 150

## 2024-06-30 MED ORDER — POLYVINYL ALCOHOL 1.4 % OP SOLN: 1.0000 [drp] | Freq: Four times a day (QID) | OPHTHALMIC | Status: DC | PRN

## 2024-06-30 MED ORDER — POLYETHYLENE GLYCOL 3350 17 G PO PACK
17.0000 g | PACK | Freq: Every day | ORAL | Status: DC
  Administered 2024-06-30: 17 g
  Filled 2024-06-30: qty 1

## 2024-06-30 MED ORDER — ADULT MULTIVITAMIN W/MINERALS CH
1.0000 | ORAL_TABLET | Freq: Every day | ORAL | Status: DC
  Administered 2024-06-30: 1
  Filled 2024-06-30: qty 1

## 2024-06-30 MED ORDER — PERFLUTREN LIPID MICROSPHERE
1.0000 mL | INTRAVENOUS | Status: DC | PRN
  Administered 2024-06-30: 2 mL via INTRAVENOUS

## 2024-06-30 MED ORDER — INSULIN GLARGINE-YFGN 100 UNIT/ML ~~LOC~~ SOLN
15.0000 [IU] | Freq: Once | SUBCUTANEOUS | Status: AC
  Administered 2024-06-30: 15 [IU] via SUBCUTANEOUS
  Filled 2024-06-30: qty 0.15

## 2024-06-30 MED ORDER — ORAL CARE MOUTH RINSE
15.0000 mL | OROMUCOSAL | Status: DC
  Administered 2024-06-30 (×8): 15 mL via OROMUCOSAL

## 2024-06-30 MED ORDER — MORPHINE 100MG IN NS 100ML (1MG/ML) PREMIX INFUSION
1.0000 mg/h | INTRAVENOUS | Status: DC
  Administered 2024-06-30: 1 mg/h via INTRAVENOUS
  Filled 2024-06-30: qty 100

## 2024-06-30 MED ORDER — PANTOPRAZOLE SODIUM 40 MG IV SOLR: 40.0000 mg | INTRAVENOUS | Status: DC

## 2024-06-30 MED ORDER — FOLIC ACID 1 MG PO TABS: 1.0000 mg | ORAL_TABLET | Freq: Every morning | ORAL | Status: DC

## 2024-06-30 MED ORDER — PHENYLEPHRINE HCL-NACL 20-0.9 MG/250ML-% IV SOLN: 0.0000 ug/min | INTRAVENOUS | Status: DC

## 2024-06-30 MED ORDER — SODIUM CHLORIDE 0.9 % IV SOLN
1.0000 g | Freq: Two times a day (BID) | INTRAVENOUS | Status: DC
  Administered 2024-06-30: 1 g via INTRAVENOUS
  Filled 2024-06-30: qty 20

## 2024-06-30 MED ORDER — FENTANYL 2500MCG IN NS 250ML (10MCG/ML) PREMIX INFUSION: 0.0000 ug/h | INTRAVENOUS | Status: DC

## 2024-06-30 MED ORDER — GLYCOPYRROLATE 1 MG PO TABS: 1.0000 mg | ORAL_TABLET | ORAL | Status: DC | PRN

## 2024-06-30 MED ORDER — MAGNESIUM SULFATE 2 GM/50ML IV SOLN
2.0000 g | Freq: Once | INTRAVENOUS | Status: AC
  Administered 2024-06-30: 2 g via INTRAVENOUS
  Filled 2024-06-30: qty 50

## 2024-07-01 LAB — HEMOGLOBIN A1C
Hgb A1c MFr Bld: 5.5 % (ref 4.8–5.6)
Mean Plasma Glucose: 111 mg/dL

## 2024-07-01 LAB — GLUCOSE, CAPILLARY: Glucose-Capillary: 211 mg/dL — ABNORMAL HIGH (ref 70–99)

## 2024-07-01 NOTE — Progress Notes (Signed)
 Patient comfortable and family at bedside  2024-07-02 07/09/1997  Time of death: 07/08/2036 Friend at bedside at time of death All belongings collected  Thedora Goodell, RN

## 2024-07-01 NOTE — H&P (Addendum)
 NAME:  [name], MRN:  [id number], DOB:  Aug 02, 1955, LOS: 1 ADMISSION DATE:  06/29/2024, CONSULTATION DATE:  11/29 REFERRING MD:  Cottie FARBER CHIEF COMPLAINT: cardiac arrest    History of Present Illness:  68 year old male with past medical history of CHF, DM, ESRD on HD TTS, paroxysmal AF on Eliquis , hyperlipidemia, hypertension who presented from [facility] nursing home as cardiac arrest.   History obtained from EDP and chart review as patient intubated, no family present. Patient reportedly seen normal 30 minutes prior to being found unresponsive. Noted in PEA arrest at 5:15 PM with EMS and ACLS was initiated. ROSC at 5:24PM. Received 1 epi, D10. On arrival to ED, hypotensive, obtunded and was intubated. Labs notable for WBC 21.1, Hgb 11.2, plt 271, bicarb 16, BUN 24, sCr 3.16, AST 1219, ALT 528, alk phos 144, t bili 2.3, AG 33. Troponin 152. Initial lactic acid 5.6>4.2. CT head negative. 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 Was given 500cc IVF, vanc/zosyn  and started on levophed . CCM consulted for admission.  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  Pertinent  Medical History  CHF, DM, ESRD on HD TTS, paroxysmal AF on Eliquis , hyperlipidemia, hypertension  Significant Hospital Events: Including procedures, antibiotic start and stop dates in addition to other pertinent events   11/29: admit post PEA cardiac arrest   Interim History / Subjective:    - sguar 500 +. On vent 50%. On vasopression. On levophed . On Bic Gtt. S/p TXA and Vit K.  On ABx. REfractory shock +. Now DNR, DNI and continue acute care but without escalation  (no 3rd presso, no a line, no CRRTT) per iPAL . AFebrile/. MRSA PCR neg.  Cutlure results pending.  Hypohermic with bair hugger +   PEr  Tammy - eSRD x 6 years, FTT with 20# weigh loss x 1 year. 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 Left BKA chronic. Has sacral and back decub/abrasioni prior to admit. REfuses intervention for health. Tammy also reports LUE swellnig that is chronic distal to area of fistula  MAP 46 or 30 flutuates Hgb 5 -6 this mroning - overnight bleeding from Rt  internal jugular site and now stopped   Madelin Stack friend now decision maker - estranged siblings gave the permission for her to be decision maker.  Objective   Blood pressure (!) 69/39, pulse 99, temperature (!) 94 F (34.4 C), resp. rate 20, weight 54.1 kg, SpO2 100%.    Vent Mode: PRVC FiO2 (%):  [50 %-100 %] 50 % Set Rate:  [18 bmp] 18 bmp Vt Set:  [440 mL] 440 mL PEEP:  [5 cmH20] 5 cmH20 Plateau Pressure:  [18 cmH20-19 cmH20] 18 cmH20   Intake/Output Summary (Last 24 hours) at  0800 Last data filed at  0600 Gross per 24 hour  Intake 4152.09 ml  Output 0 ml  Net 4152.09 ml   Filed Weights    0500  Weight: 54.1 kg    Examination:  General Appearance:  Looks criticall il. FRail cachecitc, emaciated. BAIR hugger + Head:  Normocephalic, without obvious abnormality, atraumatic Eyes:  PERRL - yes, conjunctiva/corneas - muddy     Ears:  Normal external ear canals, both ears Nose:  G tube - no Throat:  ETT TUBE - yes , OG tube -  yes Neck:  Supple,  No enlargement/tenderness/nodules Lungs: Clear to auscultation bilaterally, Ventilator   Synchrony - YES Heart:  S1 and S2 normal, no murmur, CVP - x.  Pressors - levophed  40and vaso Abdomen:  Soft, no masses, no organomegaly Genitalia / Rectal: PENIL GANGRENE + Extremities:  Extremities- LEFT BKA. RT HEEL ESCHAR + RT METATARSAL AMPUATION. ABrasion in left shin. LUE SWOLLEN Skin:  ntact in exposed areas . Sacral area - no examined but decub + Neurologic:  Sedation - none -> RASS - -2 . Moves all 4s - NO. CAM-ICU - cannot test . Orientation - no. STARES  and ? Tracks but not follwign commands     Resolved Hospital Problem list    Assessment & Plan:  OOHCA  PEA arrest  Post arrest COma Documented 9 minutes of PEA arrest; however 30 minutes of time unaccounted for. Unclear underlying cause. Query aspiration with respiratory arrest followed by cardiac arrest. Received 1 epi total. PE study negative. CT head negative. In SR on monitor. Elytes not grossly abnormal. Hemoglobin 11.   07/08/2024:  Unresponsive but does stare and ? Tracks. Though no purposeful movement. No cough. No gag  Plan  - fent gtt to ensure good sedation and comfort - versed  prn  - RASS goal -3/-4 - Above in anticipation of terminal wean   Mixed shock, cardiogenic and septic +/-  hemorrhagic (R internal jugular CVL)  - Likely mixed cardiogenic and sepsis 2/2 aspiration pneumonia   07/08/2024: MAxed out on levophed  and on vasopressin . On refractory shock - MAP 60 and sometims 40s. Per Goals: No 3rd pressors  Plan  - pressors for for MAP > 65  -No 3rd pressor - bsed on goals consider Coox  Acute hypoxic respiratory failure 2/2 above  and Aspiration pneumonia  Aspiration vs pneumonia on CT chest. PE study negative.   2024-07-08 - > does NOT meet criteria for SBT/Extubation in setting of Acute Respiratory Failure due to coma, shock carduac arrest  PLAN - full mechanical vent support - lung protective ventilation 6-8cc/kg Vt - VAP and PAD bundle in place  - titrate FiO2 to sat goal >92  - maintain peak/plats <30, driving pressures <84    ASPIRATION Pneumonia. 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 Due to rt internal jugular cvl bleed  Plan  - recheck cbvc and if still low  -  PRBC  based on goals of care - Hold Eliquis  and no IV heparin  atleast till 07/01/24 due to significant internal jugular bleed after CVL  Recent GIB  - On eliquis  ; - no sign of GIB at this time   PLAN - PPI  - trend and transfuse   LUE swelling at admit - Was seen recently by vascular for concern of steal syndrome. LUE swollen compared to right   Plan  Doppler UE depending on goals of car  Shock liver  AST/ALT/Alk phos/Tbili [phone], 2.3 on presentation   PLAN - trend  - hepatitis panel   HFmrEF  PAF on Eliquis   Last echo 08/2023 EF 40-45%, LV global hypokinesis, trivial MR AR. On eliquis , amiodarone , metoprolol . Reportedly metoprolol  recently stopped due to hypotension from GIB  in dec  22-04  PLAN - tele monitor  - repeat echo pending  - trend trop  - hold eliquis  - NO  heparin  ppx   ESRD on HD TTS  11/30 - No CRRT per goals of care   PLAN - supportitve care  High anion gap metabolic acidosis - lactic acidodis +/- STarvation/DKA at dmit  Plan   - Sal level - tylenol  level - BHOB check  - lactate repeat check - VBG check -> if PH low consider Bic gtt based on results  Diabetes    - sguar 500 + on   PLAN - ssi for now  - cbg q4h  - may need endotool depending on above    Hypertension hx Hyperlipidemia  hx   PLAN - no meds with shock  - no role for lipitor in MODS  Protein calorie malnutrition - severe at admit Failure to thrive NEglect self - refuses care  plan - start trickle feed depends on goal of care - RD consult - thiamine, folic acid , mtvn    Multiple wounds - Prior to & Present on Admit  PLAN - wound consult    GOALS  11/30 -- Tammy updated. AWait labs and revisit goals of care.   ATTESTATION & SIGNATURE   The patient Mert Dietrick is critically ill with multiple organ systems failure and requires high complexity decision making for assessment and support, frequent evaluation and titration of therapies, application of  advanced monitoring technologies and extensive interpretation of multiple databases and discussion with other appropriate health care personnel such as bedside nurses, social workers, case production designer, theatre/television/film, consultants, respiratory therapists, nutritionists, secretaries etc.,  Critical care time includes but is not restricted to just documentation time. Documentation can happen in parallel or sequential to care time depending on case mix urgency and priorities for the shift. So, overall critical Care Time devoted to patient care services described in this note is  60  Minutes.   This time reflects time of care of this signee Dr Dorethia Cave which includ does not reflect procedure time, or teaching time or supervisory time of PA/NP/Med student/Med Resident etc but could involve care discussion time     Dr. Dorethia Cave, M.D., [facility].C.P Pulmonary and Critical Care Medicine Staff Physician, [facility] [place] [facility] Pager: [phone], If no answer or between  15:00h - 7:00h: call [phone]   8:00 AM   LABS    PULMONARY Recent Labs  Lab 06/29/24 1811 06/29/24 2005 06/29/24 2233  0240  PHART  --   --  7.244*  --   PCO2ART  --   --  36.6  --   PO2ART  --   --  166*  --   HCO3  --   --  15.9* 21.0  TCO2 17* 19* 17*  --   O2SAT  --   --  99 61.8    CBC Recent Labs  Lab 06/29/24 1844 06/29/24 2005 06/29/24 2233  0240  0644  HGB 11.2*   < > 10.2* 7.3* 5.9*  HCT 37.6*   < > 30.0* 23.1* 18.7*  WBC 21.1*  --   --  20.9*  --   PLT 271  --   --  233  --    < > = values in this interval not displayed.    COAGULATION Recent Labs  Lab 06/29/24 1844  INR 1.7*    CARDIAC  No results for input(s): TROPONINI in the last  168 hours. No results for input(s): PROBNP in the last 168 hours.   CHEMISTRY Recent Labs  Lab 06/29/24 1811 06/29/24 1844 06/29/24 2005 06/29/24 2233  0500  NA 138 142 138  135 135  K 4.2 4.1 3.7 3.3* 3.7  CL 102 93* 99  --  89*  CO2  --  16*  --   --  17*  GLUCOSE 157* 236* 212*  --  548*  BUN 28* 24* 27*  --  29*  CREATININE 2.60* 3.16* 2.50*  --  2.81*  CALCIUM   --  8.9  --   --  7.2*  MG  --   --   --   --  1.7  PHOS  --   --   --   --  4.8*   Estimated Creatinine Clearance: 19.8 mL/min (A) (by C-G formula based on SCr of 2.81 mg/dL (H)).   LIVER Recent Labs  Lab 06/29/24 1844  AST 1,219*  ALT 528*  ALKPHOS 144*  BILITOT 2.3*  PROT 5.9*  ALBUMIN  1.9*  INR 1.7*     INFECTIOUS Recent Labs  Lab 06/29/24 1813 06/29/24 1952  LATICACIDVEN 5.6* 4.2*     ENDOCRINE CBG (last 3)  Recent Labs    06/29/24 2330  0337  0723  GLUCAP 280* 311* 446*         IMAGING x48h  - image(s) personally visualized  -   highlighted in bold DG Abd 1 View Result Date: 06/29/2024 EXAM: 1 VIEW XRAY OF THE ABDOMEN 06/29/2024 10:04:00 PM COMPARISON: None available. CLINICAL HISTORY: [id number] Nasogastric tube present [id number] FINDINGS: BOWEL: Enteric tube tip and sideport in the stomach. IMPRESSION: 1. Enteric tube tip and sideport in the stomach. Electronically signed by: Norman Gatlin MD 06/29/2024 10:08 PM EST RP Workstation: [id number]   DG Chest Portable 1 View Result Date: 06/29/2024 CLINICAL DATA:  Endotracheal tube adjustment EXAM: PORTABLE CHEST 1 VIEW COMPARISON:  CT angiogram chest 06/29/2024 FINDINGS: Endotracheal tube tip is 2.9 cm above the carina. Right-sided central venous catheter tip ends in the distal SVC. Enteric tube extends below the diaphragm. Small left pleural effusion and left basilar infiltrates persist. There is no pneumothorax. The osseous structures are stable. IMPRESSION: 1. Endotracheal tube tip is 2.9 cm above the carina. 2. Small left pleural effusion and left basilar infiltrates persist. Electronically Signed   By: Greig Pique M.D.   On: 06/29/2024 20:02   CT Angio Chest PE W and/or Wo Contrast Result  Date: 06/29/2024 CLINICAL DATA:  High probability for pulmonary embolism. EXAM: CT ANGIOGRAPHY CHEST WITH CONTRAST TECHNIQUE: Multidetector CT imaging of the chest was performed using the standard protocol during bolus administration of intravenous contrast. Multiplanar CT image reconstructions and MIPs were obtained to evaluate the vascular anatomy. RADIATION DOSE REDUCTION: This exam was performed according to the departmental dose-optimization program which includes automated exposure control, adjustment of the mA and/or kV according to patient size and/or use of iterative reconstruction technique. CONTRAST:  75mL OMNIPAQUE  IOHEXOL  350 MG/ML SOLN COMPARISON:  CT angiogram chest 09/23/2022 FINDINGS: Cardiovascular: Aorta is normal in size. Heart is mildly enlarged. There are atherosclerotic calcifications of the aorta and coronary arteries. Right-sided central venous catheter tip ends in the distal SVC. There is adequate opacification of the pulmonary arteries to the segmental level. No acute pulmonary embolism identified. Small chronic web-like pulmonary emboli in the left lower lobe appear unchanged from 2024. Mediastinum/Nodes: There is an enlarged precarinal lymph node measuring 1 cm similar to the prior study.  There are numerous nonenlarged prevascular, paratracheal, AP window and hilar lymph nodes. The visualized esophagus and thyroid gland are within normal limits. Lungs/Pleura: There is a stable small left pleural effusion. Right pleural effusion has resolved in the interval. There is left lower lobe airspace consolidation. Secretions are seen obstructing the left lower lobe bronchus. There also minimal patchy atelectasis and airspace opacities throughout the right lower lobe. The tip of the endotracheal tube is 3 mm above the carina. Upper Abdomen: No acute abnormality. Musculoskeletal: There is mild diffuse body wall edema. No acute osseous abnormality. Review of the MIP images confirms the above  findings. IMPRESSION: 1. No evidence for acute pulmonary embolism. 2. Endotracheal tube tip is 3 mm above the carina. Recommend retraction by 2 cm. 3. Stable small chronic web-like pulmonary emboli in the left lower lobe. 4. Stable small left pleural effusion. 5. Left lower lobe airspace consolidation with secretions obstructing the left lower lobe bronchus. Findings may be related to pneumonia or aspiration. 6. Minimal patchy atelectasis and airspace opacities in the right lower lobe. 7. Stable mild mediastinal lymphadenopathy. 8. Mild diffuse body wall edema. Aortic Atherosclerosis (ICD10-I70.0). Electronically Signed   By: Greig Pique M.D.   On: 06/29/2024 19:17   CT Head Wo Contrast Result Date: 06/29/2024 EXAM: CT HEAD WITHOUT CONTRAST 06/29/2024 07:00:24 PM TECHNIQUE: CT of the head was performed without the administration of intravenous contrast. Automated exposure control, iterative reconstruction, and/or weight based adjustment of the mA/kV was utilized to reduce the radiation dose to as low as reasonably achievable. COMPARISON: None available. CLINICAL HISTORY: POST CPR FINDINGS: BRAIN AND VENTRICLES: Age of advanced cerebral atrophy. No acute hemorrhage. No evidence of acute infarct. No hydrocephalus. No extra-axial collection. No mass effect or midline shift. ORBITS: No acute abnormality. SINUSES: Mucosal thickening in the ethmoid air cells with air-fluid level in the left sphenoid sinus. SOFT TISSUES AND SKULL: No acute soft tissue abnormality. No skull fracture. IMPRESSION: 1. No acute intracranial abnormality. 2. Age advanced cerebral atrophy. Electronically signed by: Norman Gatlin MD 06/29/2024 07:06 PM EST RP Workstation: [id number]

## 2024-07-01 NOTE — Consult Note (Addendum)
 [facility] Renal Consultation Note    Indication for Consultation:  Management of ESRD/hemodialysis, anemia, hypertension/volume, and secondary hyperparathyroidism.  HPI: [name] is a 68 y.o. male with PMH including ESRD on dialysis, CHF, HTN, a.fib, who presented from SNF with cardiac arrest. Of note, he was recently admitted with R foot osteomyelitis and had another admission in November for GI bleed and gangrene of penis. Patient was reportedly last seen 30 minutes prior to arrest. Unknown down time but ROSC took 9 minutes with EMS. On arrival to the EDW, he was hypotensive, obtunded and intubated. CT head with no acute findings. Was started on vanc/zosyn  for possible aspiration pneumonia. He had bleeding from his central line overnight and Hgb dropped to 5's, given PRBC. Patient's caregiver, Madelin, reports he has been having a lot of nausea and fatigue in general over the past week. He has been going to dialysis with last HD on Friday. She notes general decline and FTT over the past year. He is currently maxed out on levophed  and vasopressin , unable to contribute to ROS. Tammy is his decision maker and has made him DNR with no further escalation of care. She wants to give it a little time to see how he does on current therapies but would consider comfort care if no improvement.   Past Medical History:  Diagnosis Date   CHF (congestive heart failure) (HCC)    Diabetes mellitus without complication (HCC)    PATIENT JUST LEARNED HE WAS DIABETIC   ESRD on hemodialysis (HCC)    TTS at [facility]   Hepatitis    Hyperlipemia    Hypertension    Necrotizing fasciitis (HCC)    Pneumonia    HX OF PNA   Past Surgical History:  Procedure Laterality Date   A/V FISTULAGRAM N/A 08/16/2023   Procedure: A/V Fistulagram;  Surgeon: Melia Lynwood ORN, MD;  Location: [facility] INVASIVE CV LAB;  Service: Cardiovascular;  Laterality: N/A;   AMPUTATION Left 09/11/2013   Procedure: AMPUTATION BELOW  KNEE;  Surgeon: Kay Ozell Cummins, MD;  Location: [facility] OR;  Service: Orthopedics;  Laterality: Left;   AMPUTATION Right 12/18/2013   Procedure: RIGHT FOOT 1,2, TOE AMPUTATION  5th toe RAY AMPUTATION;  Surgeon: Kay Ozell Cummins, MD;  Location: [facility] OR;  Service: Orthopedics;  Laterality: Right;   ARTERY REPAIR Left 04/23/2024   Procedure: PLICATION OF AV FISTULA;  Surgeon: Lanis Fonda BRAVO, MD;  Location: [facility] OR;  Service: Vascular;  Laterality: Left;   AV FISTULA PLACEMENT Left 12/11/2019   Procedure: LEFT BRACHIOCEPHALIC FISTULA CREATION;  Surgeon: Oris Krystal FALCON, MD;  Location: [facility] OR;  Service: Vascular;  Laterality: Left;   BIOPSY  07/19/2023   Procedure: BIOPSY;  Surgeon: Albertus Gordy HERO, MD;  Location: [facility] ENDOSCOPY;  Service: Gastroenterology;;   CHOLECYSTECTOMY     ESOPHAGOGASTRODUODENOSCOPY N/A 06/15/2024   Procedure: EGD (ESOPHAGOGASTRODUODENOSCOPY);  Surgeon: Rollin Dover, MD;  Location: [facility] ENDOSCOPY;  Service: Gastroenterology;  Laterality: N/A;   ESOPHAGOGASTRODUODENOSCOPY (EGD) WITH PROPOFOL  N/A 07/19/2023   Procedure: ESOPHAGOGASTRODUODENOSCOPY (EGD) WITH PROPOFOL ;  Surgeon: Albertus Gordy HERO, MD;  Location: [facility] ENDOSCOPY;  Service: Gastroenterology;  Laterality: N/A;   HOT HEMOSTASIS N/A 07/19/2023   Procedure: HOT HEMOSTASIS (ARGON PLASMA COAGULATION/BICAP);  Surgeon: Albertus Gordy HERO, MD;  Location: [facility] ENDOSCOPY;  Service: Gastroenterology;  Laterality: N/A;  stomach   I & D EXTREMITY Left 09/03/2013   Procedure: IRRIGATION AND DEBRIDEMENT EXTREMITY;  Surgeon: Kay Ozell Cummins, MD;  Location: [facility] OR;  Service: Orthopedics;  Laterality:  Left;   I & D EXTREMITY Left 09/11/2013   Procedure: LEFT FOOT IRRIGATION AND DEBRIDEMENT;  Surgeon: Kay Ozell Cummins, MD;  Location: [facility] OR;  Service: Orthopedics;  Laterality: Left;   INSERTION OF DIALYSIS CATHETER  12/11/2019   Procedure: Insertion Of Dialysis Catheter;  Surgeon: Oris Krystal FALCON, MD;  Location: [facility] OR;  Service: Vascular;;   INSERTION OF DIALYSIS  CATHETER Right 07/11/2022   Procedure: INSERTION OF TUNNELED DIALYSIS CATHETER;  Surgeon: Eliza Lonni RAMAN, MD;  Location: [facility] OR;  Service: Vascular;  Laterality: Right;   LEFT HEART CATH AND CORONARY ANGIOGRAPHY N/A 12/13/2019   Procedure: LEFT HEART CATH AND CORONARY ANGIOGRAPHY;  Surgeon: Dann Candyce RAMAN, MD;  Location: [facility] INVASIVE CV LAB;  Service: Cardiovascular;  Laterality: N/A;   PERIPHERAL VASCULAR BALLOON ANGIOPLASTY Left 08/16/2023   Procedure: PERIPHERAL VASCULAR BALLOON ANGIOPLASTY;  Surgeon: Melia Lynwood ORN, MD;  Location: [facility] INVASIVE CV LAB;  Service: Cardiovascular;  Laterality: Left;   REVISON OF ARTERIOVENOUS FISTULA Left 07/11/2022   Procedure: REVISON OF LEFT ARM ARTERIOVENOUS FISTULA;  Surgeon: Eliza Lonni RAMAN, MD;  Location: [facility] OR;  Service: Vascular;  Laterality: Left;   REVISON OF ARTERIOVENOUS FISTULA Left 04/23/2024   Procedure: REVISON OF ARTERIOVENOUS FISTULA;  Surgeon: Lanis Fonda BRAVO, MD;  Location: [facility] OR;  Service: Vascular;  Laterality: Left;   RIGHT HEART CATH N/A 12/13/2019   Procedure: RIGHT HEART CATH;  Surgeon: Dann Candyce RAMAN, MD;  Location: [facility] INVASIVE CV LAB;  Service: Cardiovascular;  Laterality: N/A;   Family History  Problem Relation Age of Onset   Diabetes type II Mother    Dementia Mother    Heart disease Father    Social History:  reports that he has never smoked. He has never used smokeless tobacco. He reports that he does not drink alcohol and does not use drugs.  ROS: As per HPI otherwise negative.  Physical Exam: Vitals:    0630  0645  0700  0745  BP: (!) 50/29 (!) 62/42 (!) 58/28 (!) 69/39  Pulse:      Resp: (!) 25 (!) 26 (!) 23 20  Temp:      TempSrc:      SpO2:    100%  Weight:         General: Sedated male on vent Head: Normocephalic, atraumatic, sclera non-icteric, mucus membranes are moist. Lungs: Intubated Heart: RRR with normal S1, S2.  Abdomen: Soft, non-distended with  normoactive bowel sounds.  Lower extremities: L BKA, Rt metatarsal amputation, no LE edema noted.  Neuro: sedated and unresponsive to voice on vent Dialysis Access: AVF + bruit  Allergies  Allergen Reactions   Ultram  [Tramadol ] Nausea And Vomiting and Other (See Comments)    Allergic, per [facility]   Prior to Admission medications   Medication Sig Start Date End Date Taking? Authorizing Provider  Amino Acids-Protein Hydrolys (PRO-STAT) LIQD Take 30 mLs by mouth 2 (two) times daily.   Yes [provider]  amiodarone  (PACERONE ) 200 MG tablet Take 1 tablet (200 mg total) by mouth daily. 10/15/22 07/31/24 Yes McDiarmid, Krystal BIRCH, MD  apixaban  (ELIQUIS ) 2.5 MG TABS tablet Take 1 tablet (2.5 mg total) by mouth 2 (two) times daily. 04/25/24  Yes Schuh, McKenzi P, PA-C  atorvastatin  (LIPITOR) 20 MG tablet Take 1 tablet (20 mg total) by mouth daily. Patient taking differently: Take 20 mg by mouth at bedtime. 12/17/19  Yes Christian, Rylee, MD  bethanechol  (URECHOLINE ) 5 MG tablet Take 5 mg by mouth 3 (three)  times daily. 01/23/24  Yes [provider]  cephALEXin  (KEFLEX ) 500 MG capsule Take 500 mg by mouth 2 (two) times daily. 06/25/24 07/02/24 Yes [provider]  clotrimazole -betamethasone (LOTRISONE) cream Apply 1 Application topically See admin instructions. Apply topically to rash every day shift   Yes [provider]  cyanocobalamin  1000 MCG tablet Take 1,000 mcg by mouth daily.   Yes [provider]  DESITIN 40 % PSTE Apply 1 Application topically See admin instructions. Apply to the buttocks and the sacrum in the morning and at bedtime   Yes [provider]  ferric citrate  (AURYXIA ) 1 GM 210 MG(Fe) tablet Take 210 mg by mouth with breakfast, with lunch, and with evening meal. 07/29/23  Yes [provider]  folic acid  (FOLVITE ) 1 MG tablet Take 1 mg by mouth in the morning.   Yes [provider]  hydrocortisone  cream 1 % Apply 1  Application topically daily as needed (rash on affected arm and elbow). Applied to arm and elbow   Yes [provider]  loperamide (IMODIUM) 2 MG capsule Take 2 mg by mouth as needed (after each episode of loose stools).   Yes [provider]  midodrine  (PROAMATINE ) 5 MG tablet Take 1 tablet (5 mg total) by mouth 3 (three) times daily with meals. Patient taking differently: Take 5 mg by mouth See admin instructions. Take 5 mg by mouth with all meals and HOLD for a Systolic reading greater than 869 OR hypertension 06/21/24 07/21/24 Yes Leotis Bogus, MD  oxyCODONE  (OXY IR/ROXICODONE ) 5 MG immediate release tablet Take 5 mg by mouth every 4 (four) hours as needed (for pain).   Yes [provider]  OXYGEN Inhale 2 L/min into the lungs as needed (shortness of breath or maintain sats > 90%).   Yes [provider]  pantoprazole  (PROTONIX ) 40 MG tablet Take 1 tablet (40 mg total) by mouth 2 (two) times daily before a meal. 07/21/23  Yes Caleen Qualia, MD  promethazine  (PHENERGAN ) 25 MG/ML injection Inject 25 mg into the muscle every 4 (four) hours as needed for nausea or vomiting.   Yes [provider]  silver sulfADIAZINE (SILVADENE) 1 % cream Apply 1 Application topically See admin instructions. Apply to penile scab/abnormal tissue 3 times a day 06/07/24  Yes [provider]  tamsulosin  (FLOMAX ) 0.4 MG CAPS capsule Take 0.4 mg by mouth every Monday, Wednesday, and Friday. 12/20/22  Yes [provider]   Current Facility-Administered Medications  Medication Dose Route Frequency Provider Last Rate Last Admin   0.9 %  sodium chloride  infusion  250 mL Intravenous Continuous Dagenhart, Jamie H, NP   Held at  0039   acetaminophen  (TYLENOL ) 160 MG/5ML solution 650 mg  650 mg Per Tube Q4H Dagenhart, Jamie H, NP       Or   acetaminophen  (TYLENOL ) suppository 650 mg  650 mg Rectal Q4H Dagenhart, Jamie H, NP       [START ON 07/01/2024]  acetaminophen  (TYLENOL ) tablet 650 mg  650 mg Oral Q4H PRN Dagenhart, Jamie H, NP       Or   [START ON 07/01/2024] acetaminophen  (TYLENOL ) 160 MG/5ML solution 650 mg  650 mg Per Tube Q4H PRN Dagenhart, Jamie H, NP       Or   [START ON 07/01/2024] acetaminophen  (TYLENOL ) suppository 650 mg  650 mg Rectal Q4H PRN Dagenhart, Jamie H, NP       Chlorhexidine  Gluconate Cloth 2 % PADS 6 each  6 each Topical  Daily Dagenhart, Jamie H, NP   6 each at 06/29/24 2146   docusate (COLACE) 50 MG/5ML liquid 100 mg  100 mg Per Tube BID Geronimo Amel, MD   100 mg at  9070   fentaNYL  (SUBLIMAZE ) injection 50-200 mcg  50-200 mcg Intravenous Q30 min PRN Geronimo Amel, MD       NOREEN ON 07/01/2024] folic acid  (FOLVITE ) tablet 1 mg  1 mg Per Tube q AM Geronimo Amel, MD       hydrocortisone  sodium succinate (SOLU-CORTEF ) 100 MG injection 100 mg  100 mg Intravenous Q12H Adolph Maxwell E, PA-C   100 mg at 06/29/24 2211   insulin  aspart (novoLOG ) injection 0-15 Units  0-15 Units Subcutaneous Q4H Geronimo Amel, MD   9 Units at  9093   midazolam  PF (VERSED ) injection 1-2 mg  1-2 mg Intravenous Q1H PRN Ramaswamy, Murali, MD       multivitamin with minerals tablet 1 tablet  1 tablet Per Tube Daily Geronimo Amel, MD   1 tablet at  0930   norepinephrine  (LEVOPHED ) 16 mg in (0.064 mg/mL) premix infusion  0-40 mcg/min Intravenous Continuous Denninger, Jade M, RPH       ondansetron  (ZOFRAN ) injection 4 mg  4 mg Intravenous Q6H PRN Dagenhart, Jamie H, NP       Oral care mouth rinse  15 mL Mouth Rinse Q2H Albustami, Mancel HERO, MD   15 mL at  0908   Oral care mouth rinse  15 mL Mouth Rinse PRN Albustami, Mancel HERO, MD       pantoprazole  (PROTONIX ) injection 40 mg  40 mg Intravenous Q12H Dagenhart, Jamie H, NP   40 mg at  9070   phenylephrine  (NEO-SYNEPHRINE) 20mg /NS premix infusion  0-400 mcg/min Intravenous Titrated Rober Jodelle DASEN, MD       piperacillin -tazobactam (ZOSYN )  IVPB 3.375 g  3.375 g Intravenous Q8H Cyndy Ozell DASEN, RPH 12.5 mL/hr at  0600 Infusion Verify at  0600   polyethylene glycol (MIRALAX  / GLYCOLAX ) packet 17 g  17 g Per Tube Daily Geronimo Amel, MD   17 g at  9070   potassium chloride  10 mEq in 100 mL IVPB  10 mEq Intravenous Q1 Hr x 3 Adolph Maxwell BRAVO, PA-C 100 mL/hr at  0902 10 mEq at  0902   sodium bicarbonate  150 mEq in sterile water  1,150 mL infusion   Intravenous Continuous Rober Jodelle DASEN, MD 125 mL/hr at  0735 New Bag at  0735   thiamine (VITAMIN B1) tablet 100 mg  100 mg Per Tube Daily Geronimo Amel, MD   100 mg at  0930   vasopressin (PITRESSIN) 20 Units in 100 mL (0.2 unit/mL) infusion-*FOR SHOCK*  0-0.04 Units/min Intravenous Continuous Adolph Maxwell BRAVO, PA-C 12 mL/hr at  0600 0.04 Units/min at  0600   Labs: Basic Metabolic Panel: Recent Labs  Lab 06/29/24 1844 06/29/24 2005 06/29/24 2233  0500  0644  NA 142 138 135 135 136  K 4.1 3.7 3.3* 3.7 3.4*  CL 93* 99  --  89* 88*  CO2 16*  --   --  17* 18*  GLUCOSE 236* 212*  --  548* 557*  BUN 24* 27*  --  29* 30*  CREATININE 3.16* 2.50*  --  2.81* 2.88*  CALCIUM  8.9  --   --  7.2* 7.0*  PHOS  --   --   --  4.8*  --    Liver Function Tests: Recent Labs  Lab 06/29/24 1844   0644  AST 1,219* 477*  ALT 528* 252*  ALKPHOS 144* 82  BILITOT 2.3* 1.0  PROT 5.9* 3.7*  ALBUMIN  1.9* <1.5*   No results for input(s): LIPASE, AMYLASE in the last 168 hours. No results for input(s): AMMONIA in the last 168 hours. CBC: Recent Labs  Lab 06/29/24 1844 06/29/24 2005 06/29/24 2233  0240  0644  WBC 21.1*  --   --  20.9*  --   NEUTROABS 19.7*  --   --   --   --   HGB 11.2*   < > 10.2* 7.3* 5.9*  HCT 37.6*   < > 30.0* 23.1* 18.7*  MCV 97.9  --   --  94.3  --   PLT 271  --   --  233  --    < > = values in this interval not displayed.   Cardiac  Enzymes: No results for input(s): CKTOTAL, CKMB, CKMBINDEX, TROPONINI in the last 168 hours. CBG: Recent Labs  Lab 06/29/24 1803 06/29/24 2016 06/29/24 2330  0337  0723  GLUCAP 78 199* 280* 311* 446*   Iron  Studies: No results for input(s): IRON , TIBC, TRANSFERRIN, FERRITIN in the last 72 hours. Studies/Results: DG Abd 1 View Result Date: 06/29/2024 EXAM: 1 VIEW XRAY OF THE ABDOMEN 06/29/2024 10:04:00 PM COMPARISON: None available. CLINICAL HISTORY: [id number] Nasogastric tube present [id number] FINDINGS: BOWEL: Enteric tube tip and sideport in the stomach. IMPRESSION: 1. Enteric tube tip and sideport in the stomach. Electronically signed by: Norman Gatlin MD 06/29/2024 10:08 PM EST RP Workstation: [id number]   DG Chest Portable 1 View Result Date: 06/29/2024 CLINICAL DATA:  Endotracheal tube adjustment EXAM: PORTABLE CHEST 1 VIEW COMPARISON:  CT angiogram chest 06/29/2024 FINDINGS: Endotracheal tube tip is 2.9 cm above the carina. Right-sided central venous catheter tip ends in the distal SVC. Enteric tube extends below the diaphragm. Small left pleural effusion and left basilar infiltrates persist. There is no pneumothorax. The osseous structures are stable. IMPRESSION: 1. Endotracheal tube tip is 2.9 cm above the carina. 2. Small left pleural effusion and left basilar infiltrates persist. Electronically Signed   By: Greig Pique M.D.   On: 06/29/2024 20:02   CT Angio Chest PE W and/or Wo Contrast Result Date: 06/29/2024 CLINICAL DATA:  High probability for pulmonary embolism. EXAM: CT ANGIOGRAPHY CHEST WITH CONTRAST TECHNIQUE: Multidetector CT imaging of the chest was performed using the standard protocol during bolus administration of intravenous contrast. Multiplanar CT image reconstructions and MIPs were obtained to evaluate the vascular anatomy. RADIATION DOSE REDUCTION: This exam was performed according to the departmental dose-optimization program which  includes automated exposure control, adjustment of the mA and/or kV according to patient size and/or use of iterative reconstruction technique. CONTRAST:  75mL OMNIPAQUE  IOHEXOL  350 MG/ML SOLN COMPARISON:  CT angiogram chest 09/23/2022 FINDINGS: Cardiovascular: Aorta is normal in size. Heart is mildly enlarged. There are atherosclerotic calcifications of the aorta and coronary arteries. Right-sided central venous catheter tip ends in the distal SVC. There is adequate opacification of the pulmonary arteries to the segmental level. No acute pulmonary embolism identified. Small chronic web-like pulmonary emboli in the left lower lobe appear unchanged from 2024. Mediastinum/Nodes: There is an enlarged precarinal lymph node measuring 1 cm similar to the prior study. There are numerous nonenlarged prevascular, paratracheal, AP window and hilar lymph nodes. The visualized esophagus and thyroid gland are within normal limits. Lungs/Pleura: There is a stable small left pleural effusion. Right pleural effusion has resolved in the interval. There is  left lower lobe airspace consolidation. Secretions are seen obstructing the left lower lobe bronchus. There also minimal patchy atelectasis and airspace opacities throughout the right lower lobe. The tip of the endotracheal tube is 3 mm above the carina. Upper Abdomen: No acute abnormality. Musculoskeletal: There is mild diffuse body wall edema. No acute osseous abnormality. Review of the MIP images confirms the above findings. IMPRESSION: 1. No evidence for acute pulmonary embolism. 2. Endotracheal tube tip is 3 mm above the carina. Recommend retraction by 2 cm. 3. Stable small chronic web-like pulmonary emboli in the left lower lobe. 4. Stable small left pleural effusion. 5. Left lower lobe airspace consolidation with secretions obstructing the left lower lobe bronchus. Findings may be related to pneumonia or aspiration. 6. Minimal patchy atelectasis and airspace opacities in the  right lower lobe. 7. Stable mild mediastinal lymphadenopathy. 8. Mild diffuse body wall edema. Aortic Atherosclerosis (ICD10-I70.0). Electronically Signed   By: Greig Pique M.D.   On: 06/29/2024 19:17   CT Head Wo Contrast Result Date: 06/29/2024 EXAM: CT HEAD WITHOUT CONTRAST 06/29/2024 07:00:24 PM TECHNIQUE: CT of the head was performed without the administration of intravenous contrast. Automated exposure control, iterative reconstruction, and/or weight based adjustment of the mA/kV was utilized to reduce the radiation dose to as low as reasonably achievable. COMPARISON: None available. CLINICAL HISTORY: POST CPR FINDINGS: BRAIN AND VENTRICLES: Age of advanced cerebral atrophy. No acute hemorrhage. No evidence of acute infarct. No hydrocephalus. No extra-axial collection. No mass effect or midline shift. ORBITS: No acute abnormality. SINUSES: Mucosal thickening in the ethmoid air cells with air-fluid level in the left sphenoid sinus. SOFT TISSUES AND SKULL: No acute soft tissue abnormality. No skull fracture. IMPRESSION: 1. No acute intracranial abnormality. 2. Age advanced cerebral atrophy. Electronically signed by: Norman Gatlin MD 06/29/2024 07:06 PM EST RP Workstation: [id number]    Dialysis Orders: Center: [facility]  on MWF . 180NRe BFR 4500 DFR 700 EDW 52kg 2K 2Ca AVF 15g no heparin   Assessment/Plan:  S/p Cardiac arrest: Maxed out on 2 pressors and care giver is realistic about outcomes, does not want to escalate current care. Per ICU team  ESRD:  On MWF schedule, no indication for dialysis at present and likely would not tolerate. Will continue to follow for GOC  Hypertension/volume: As above, does not appear volume overloaded, hypotensive despite 2 pressors  Anemia: Hgb dropped after bleeding from central line. PRBC pending  Metabolic bone disease: calcium  low but alb <1.5. Phos at goal. Hold off on VDRA/binders for now  Nutrition:  NPO  Lucie Collet, PA-C , 9:46 AM   [facility] Pager: [phone]

## 2024-07-01 NOTE — Progress Notes (Signed)
 eLink Physician-Brief Progress Note Patient Name: [name] DOB: [birth year] MRN: [id number]   Date of Service    HPI/Events of Note  E link asked to enter comfort care orders when friends have gathered. Detailed discussion held by day MD, documented in chart. Friends now all at bedside.   eICU Interventions  Comfort care order set placed     Intervention Category Major Interventions: Respiratory failure - evaluation and management  [name] , 7:59 PM

## 2024-07-01 NOTE — Progress Notes (Signed)
 Initial Nutrition Assessment  DOCUMENTATION CODES:   Not applicable  INTERVENTION:   Once appropriate for tube feeds, recommend:  Vital 1.2@50ml /hr- Initiate at 53ml/hr and increase by 10ml/hr q 8 hours until goal rate is reached.   Free water  flushes 30ml q4 hours to maintain tube patency   Regimen provides 1440kcal/day, 90g/day protein and 1156ml/day of free water .   Pt at high refeed risk; recommend monitor potassium, magnesium  and phosphorus labs daily until stable  Rena-vit daily via tube   Juven Fruit Punch BID via tube, each serving provides 95kcal and 2.5g of protein (amino acids glutamine and arginine)  Thiamine 100mg  IV daily   Daily weights   NUTRITION DIAGNOSIS:   Inadequate oral intake related to inability to eat (pt sedated and ventilated) as evidenced by NPO status.  GOAL:   Provide needs based on ASPEN/SCCM guidelines  MONITOR:   Vent status, Weight trends, Labs, Skin, I & O's  REASON FOR ASSESSMENT:   Consult Enteral/tube feeding initiation and management  ASSESSMENT:   68 y/o male with h/o DM, L BKA, HTN, CKD IV, PAD, cardiomyopathy, IDA, DVT, PAF, ESRD on HD, CHF, hepatitis and HLD who is admitted with PEA arrest and suspected aspiration.  RD working remotely.  Pt sedated and ventilated. OGT in place (gastric). RD received consult for initiation of trickle tube feeds. Spoke with MD, pt with worsening pressor requirements and lactic acid; would recommend holding tube feeds until patient is hemodynamically stable. Pt is likely at high refeed risk. No plans for CRRT at this time. There are ongoing discussions regarding goals of care.   Per chart, pt appears fairly weight stable at baseline.   Medications reviewed and include: colace, folic acid , thiamine, solu-cortef , insulin , MVI, protonix , miralax , levophed , zosyn , KCl, sodium bicarbonate , vasopressin   Labs reviewed: K 3.4(L), BUN 30(H), creat 2.88(H), P 4.8(H), Mg 1.7 wnl, Ca 7.0(L), adj.  >9.0, albumin  <1.5(L), AST 477(H), ALT 252(H)  Patient is currently intubated on ventilator support MV: 12.6 L/min Temp (24hrs), Avg:96.1 F (35.6 C), Min:93.5 F (34.2 C), Max:98.4 F (36.9 C)  MAP- <   UOP- 0ml   NUTRITION - FOCUSED PHYSICAL EXAM: Unable to perform at this time   Diet Order:   Diet Order             Diet NPO time specified  Diet effective now                  EDUCATION NEEDS:   No education needs have been identified at this time  Skin:  Skin Assessment: Reviewed RN Assessment (ecchymosis, Stage II sacrum)  Last BM:  11/30- type 6  Height:   Ht Readings from Last 1 Encounters:  06/13/24 5' 2 (1.575 m)    Weight:   Wt Readings from Last 1 Encounters:   54.1 kg    Ideal Body Weight:  53.6 kg  BMI:  Body mass index is 21.81 kg/m.  Estimated Nutritional Needs:   Kcal:  1523kcal/day  Protein:  85-95g/day  Fluid:  UOP +1L  Augustin Shams MS, RD, LDN If unable to be reached, please send secure chat to RD inpatient available from 8:00a-4:00p daily

## 2024-07-01 NOTE — Progress Notes (Signed)
 Date and time results received:  9367   Test: glucose  Critical Value: 548  Name of Provider Notified: elink MD, Rober Corean JAYSON Marci

## 2024-07-01 NOTE — Progress Notes (Signed)
    1600  Spiritual Encounters  Type of Visit Initial  Care provided to: Alyse [place] partners present during encounter Nurse  Referral source Physician  Reason for visit End-of-life  OnCall Visit Yes  Spiritual Framework  Presenting Themes Coping tools  Community/Connection Friend(s)  Family Stress Factors Health changes;Family relationships  Interventions  Spiritual Care Interventions Made Prayer;Compassionate presence;Decision-making support/facilitation;Supported grief process   Chaplain responded to visit a patient who is near end of life. Met with patient's friend, Tammy. Chaplain connected with Tammy on her friendship with the patient, explored other sources of strength. Chaplain extended hospitality.Chaplain offered prayer and support.Chaplain introduced spiritual care services. Spiritual care services available as needed.   Juliene CHRISTELLA Das, Chaplain

## 2024-07-01 NOTE — Progress Notes (Signed)
 Pt BP unable intermittently unable to read. Pt with wounds to RLE, Left BKA, and LUE fistula. Not escalating care. No new orders given.

## 2024-07-01 NOTE — Plan of Care (Signed)
  Problem: Education: Goal: Ability to manage disease process will improve Outcome: Progressing   Problem: Cardiac: Goal: Ability to achieve and maintain adequate cardiopulmonary perfusion will improve Outcome: Progressing   Problem: Neurologic: Goal: Promote progressive neurologic recovery Outcome: Progressing   Problem: Skin Integrity: Goal: Risk for impaired skin integrity will be minimized. Outcome: Progressing   Problem: Education: Goal: Ability to describe self-care measures that may prevent or decrease complications (Diabetes Survival Skills Education) will improve Outcome: Progressing Goal: Individualized Educational Video(s) Outcome: Progressing   Problem: Coping: Goal: Ability to adjust to condition or change in health will improve Outcome: Progressing   Problem: Fluid Volume: Goal: Ability to maintain a balanced intake and output will improve Outcome: Progressing   Problem: Health Behavior/Discharge Planning: Goal: Ability to identify and utilize available resources and services will improve Outcome: Progressing Goal: Ability to manage health-related needs will improve Outcome: Progressing   Problem: Metabolic: Goal: Ability to maintain appropriate glucose levels will improve Outcome: Progressing   Problem: Nutritional: Goal: Maintenance of adequate nutrition will improve Outcome: Progressing Goal: Progress toward achieving an optimal weight will improve Outcome: Progressing   Problem: Skin Integrity: Goal: Risk for impaired skin integrity will decrease Outcome: Progressing   Problem: Tissue Perfusion: Goal: Adequacy of tissue perfusion will improve Outcome: Progressing   Problem: Education: Goal: Knowledge of General Education information will improve Description: Including pain rating scale, medication(s)/side effects and non-pharmacologic comfort measures Outcome: Progressing   Problem: Health Behavior/Discharge Planning: Goal: Ability to  manage health-related needs will improve Outcome: Progressing   Problem: Clinical Measurements: Goal: Ability to maintain clinical measurements within normal limits will improve Outcome: Progressing Goal: Will remain free from infection Outcome: Progressing Goal: Diagnostic test results will improve Outcome: Progressing Goal: Respiratory complications will improve Outcome: Progressing Goal: Cardiovascular complication will be avoided Outcome: Progressing   Problem: Activity: Goal: Risk for activity intolerance will decrease Outcome: Progressing   Problem: Nutrition: Goal: Adequate nutrition will be maintained Outcome: Progressing   Problem: Coping: Goal: Level of anxiety will decrease Outcome: Progressing   Problem: Elimination: Goal: Will not experience complications related to bowel motility Outcome: Progressing Goal: Will not experience complications related to urinary retention Outcome: Progressing   Problem: Pain Managment: Goal: General experience of comfort will improve and/or be controlled Outcome: Progressing   Problem: Safety: Goal: Ability to remain free from injury will improve Outcome: Progressing   Problem: Skin Integrity: Goal: Risk for impaired skin integrity will decrease Outcome: Progressing

## 2024-07-01 NOTE — Progress Notes (Signed)
 EEG complete - results pending

## 2024-07-01 NOTE — Consult Note (Signed)
 WOC team consulted for multiple wounds. Patient is familiar to [facility] team from previous admission with multiple pressure injuries. Secure chat to primary team requesting photo documentation of wounds.   Thank you,    Powell Bar MSN, RN-BC, [employer]

## 2024-07-01 NOTE — Progress Notes (Addendum)
 Progress note: I spoke with Madelin Paschal, friend, at bedside. She deferred decision making to his sister who we were able to get in contact with. I spoke with Donia and Elsie Mac - sister and brother of patient. They are estranged. They have given verbal consent to allow Tammy Anis to be decision maker. Jamie Dagenhart, NP was present for this discussion. I updated them on patient status. I then spoke with Tammy again. Updated her on events overnight since talking with her over the phone. Decision made for NO ESCALATION. She made him DNR earlier in the evening. She states he has been declining for some time and refusing to eat as of recent. Will continue all current care, not adding 3rd vasopressor, no arterial line insertion. Will not attempt dialysis at this time. She notes he does not make urine. She is okay with ongoing antibiotics, 2 vasopressors, bicarb gtt, watchful waiting.   Additional CC time: 60 minutes  Tinnie FORBES Furth, PA-C [facility]  1:46 AM  Please see [email or web address] for pager details.  From 7A-7P if no response, please call [phone] After hours, please call ELink [phone]

## 2024-07-01 NOTE — IPAL (Addendum)
  Interdisciplinary Goals of Care Family Meeting   Date carried out:   Location of the meeting: Bedside  Member's involved: Physician, Bedside Registered Nurse, Family Member or next of kin, and Other: Tammy the friend who has been asked to shoulder that responsibily by estranged family members  Durable Power of Attorney or environmental health practitioner: Tammy    Discussion: We discussed goals of care for [name] .  - actively dyying EEEG with burst suppression. Her is terminally ill  TERMINAL WEAN DISCUSSION  Explained concept of terminal wean  Explained how terminal wean works at the bedside Explained MD, RN, and RT role in this Explained that we are only stopping medicines and therapies  that are not effective and are only prolonging her suffering Explained that vent and pressors are not consistent with her goals Explained that we are still caring for patient by providing care aimed at comfort, agony, pain, distress  Explained post terminal wean we allow nature to take course    Explain predicting death post terminal is unpredictable but in patient [name] with [birth year] and [address] [place] [address] . However, likely to happen in in < 1 mmin to few mniutes   Explained Tammy can time the event based on personal needs and family/friends to gather - there is no family though  Explained that medicines used for comfort are morphine  and benzo and explained the doctrine of double effect  Tammy appreciative and will time terminal wean later on   when she is emotionally ready  Chaplain consult placed  Code status:   Code Status: Limited: Do not attempt resuscitation (DNR) -DNR-LIMITED -Do Not Intubate/DNI    Disposition: Continue current acute care but heading to comfor tcare . START Morphine  gtt (per neuro air hunger can be expected)  Time spent for the meeting: 10 min  Addendum: pern RN - likely terminal wean after 7pm     SIGNATURE     Dr. Dorethia Cave, M.D., F.C.C.P,  Pulmonary and Critical Care Medicine Staff Physician, [facility] Center Director - Interstitial Lung Disease  Program  Pulmonary Fibrosis [facility] at [facility] [place], [place], [address]   Pager: [phone], If no answer  -> Check AMION or Try [phone] Telephone (clinical office): [phone] Telephone (research): [phone]  5:15 PM

## 2024-07-01 NOTE — Progress Notes (Signed)
 PHARMACY - PHYSICIAN COMMUNICATION CRITICAL VALUE ALERT - BLOOD CULTURE IDENTIFICATION (BCID)  [name] is an 68 y.o. male who presented to [facility] on 06/29/2024 with a chief complaint of cardiac arrest.   Assessment:  BCID 1/4 for acinetobacter and staph spp.   Name of physician (or Provider) Contacted: Dr. Geronimo   Current antibiotics: Zosyn , vanc   Changes to prescribed antibiotics recommended:  Change to meropenem + Vanc   Results for orders placed or performed during the hospital encounter of 06/29/24  Blood Culture ID Panel (Reflexed) (Collected: 06/29/2024  7:39 PM)  Result Value Ref Range   Enterococcus faecalis NOT DETECTED NOT DETECTED   Enterococcus Faecium NOT DETECTED NOT DETECTED   Listeria monocytogenes NOT DETECTED NOT DETECTED   Staphylococcus species DETECTED (A) NOT DETECTED   Staphylococcus aureus (BCID) NOT DETECTED NOT DETECTED   Staphylococcus epidermidis NOT DETECTED NOT DETECTED   Staphylococcus lugdunensis NOT DETECTED NOT DETECTED   Streptococcus species NOT DETECTED NOT DETECTED   Streptococcus agalactiae NOT DETECTED NOT DETECTED   Streptococcus pneumoniae NOT DETECTED NOT DETECTED   Streptococcus pyogenes NOT DETECTED NOT DETECTED   A.calcoaceticus-baumannii DETECTED (A) NOT DETECTED   Bacteroides fragilis NOT DETECTED NOT DETECTED   Enterobacterales NOT DETECTED NOT DETECTED   Enterobacter cloacae complex NOT DETECTED NOT DETECTED   Escherichia coli NOT DETECTED NOT DETECTED   Klebsiella aerogenes NOT DETECTED NOT DETECTED   Klebsiella oxytoca NOT DETECTED NOT DETECTED   Klebsiella pneumoniae NOT DETECTED NOT DETECTED   Proteus species NOT DETECTED NOT DETECTED   Salmonella species NOT DETECTED NOT DETECTED   Serratia marcescens NOT DETECTED NOT DETECTED   Haemophilus influenzae NOT DETECTED NOT DETECTED   Neisseria meningitidis NOT DETECTED NOT DETECTED   Pseudomonas aeruginosa NOT DETECTED NOT DETECTED   Stenotrophomonas  maltophilia NOT DETECTED NOT DETECTED   Candida albicans NOT DETECTED NOT DETECTED   Candida auris NOT DETECTED NOT DETECTED   Candida glabrata NOT DETECTED NOT DETECTED   Candida krusei NOT DETECTED NOT DETECTED   Candida parapsilosis NOT DETECTED NOT DETECTED   Candida tropicalis NOT DETECTED NOT DETECTED   Cryptococcus neoformans/gattii NOT DETECTED NOT DETECTED   CTX-M ESBL NOT DETECTED NOT DETECTED   Carbapenem resistance IMP NOT DETECTED NOT DETECTED   Carbapenem resistance KPC NOT DETECTED NOT DETECTED   Carbapenem resistance NDM NOT DETECTED NOT DETECTED   Carbapenem resistance VIM NOT DETECTED NOT DETECTED    Jahira Swiss M Jordyn Hofacker   3:19 PM

## 2024-07-01 NOTE — Procedures (Signed)
 Extubation Procedure Note  Patient Details:   Name: Cohan Stipes DOB: Mar 14, 1956 MRN: [id number]   Airway Documentation:  Airway 7.5 mm (Active)  Secured at (cm) 22 cm  1600  Measured From Lips  1530  Secured Location Right  1530  Secured By [employer]  1600  Bite Block No  1530  Tube Holder Repositioned Yes  1530  Prone position No  1530  Head position Left 06/29/24 2100  Cuff Pressure (cm H2O) Green OR 18-26 CmH2O  0745  Site Condition Dry  1600   Vent end date:  Vent end time: 2017   Evaluation  O2 sats: currently acceptable Complications: No apparent complications Patient did tolerate procedure well. Bilateral Breath Sounds: Diminished   Pt extubated to comfort care measures per order set. RN at bedside.  Marry JAYSON Mattock , 8:24 PM

## 2024-07-01 NOTE — Procedures (Signed)
 History: 68 yo M s/p cardiac arrest. EEG to rule out seizure.   EEG Duration: 23 minutes  Sedation: none  Patient State: comatose.   Technique: This EEG was acquired with electrodes placed according to the International 10-20 electrode system (including Fp1, Fp2, F3, F4, C3, C4, P3, P4, O1, O2, T3, T4, T5, T6, A1, A2, Fz, Cz, Pz). The following electrodes were missing or displaced: none.   Background: The EEG is a burst suppression pattern with rare epileptiform bursts lasting less than a second with an interburst interval of 20 to 30 seconds.  No clinical correlate was seen to these bursts.  Photic stimulation: Physiologic driving is not performed  EEG Abnormalities: 1) burst suppression EEG  Clinical Interpretation: This EEG is consistent with a profound cerebral dysfunction as can be seen with severe anoxic injury.  In the setting of postcardiac arrest, and in the absence of sedation, this EEG pattern is highly correlated with poor prognosis.    Aisha Seals, MD [facility]   If 7pm- 7am, please page neurology on call as listed in AMION.

## 2024-07-01 DEATH — deceased

## 2024-07-03 LAB — CULTURE, BLOOD (ROUTINE X 2): Special Requests: ADEQUATE

## 2024-07-04 LAB — CULTURE, BLOOD (ROUTINE X 2)
Culture: NO GROWTH
Special Requests: ADEQUATE

## 2024-08-01 NOTE — Discharge Summary (Signed)
"        DISCHARGE SUMMARY    Date of admit: 06/29/2024  5:58 PM Date of discharge: 06/23/2024 10:01 PM Length of Stay: 1 days  PCP is Patient, No Pcp Per  CAUSE(S) OF DEATH  Mixed Cardiogenic and Septic shock due to Aspiration Pneumonia resulting in cardiac arrest   PROBLEM LIST Principal Problem:   Cardiac arrest, cause unspecified (HCC)    Present on Admission Present on Admission:  Cardiac arrest, cause unspecified [facility])   Active Problems Principal Problem:   Cardiac arrest, cause unspecified [facility])   Resolved Problems Active Hospital Problems   Diagnosis Date Noted   Cardiac arrest, cause unspecified (HCC) 06/29/2024    Resolved Hospital Problems  No resolved problems to display.     Comprehensive Problem List Patient Active Problem List   Diagnosis Date Noted   Cardiac arrest, cause unspecified (HCC) 06/29/2024   Gangrene of penis 06/14/2024   BPH (benign prostatic hyperplasia) 06/14/2024   Hyperlipemia    GI bleed 06/13/2024   Pressure injury of ankle, stage 2 (HCC) 08/28/2023   Septic shock (HCC) 08/24/2023   Encephalopathy acute 08/24/2023   Lactic acidosis 08/24/2023   Melena 07/21/2023   Heme positive stool 07/19/2023   Acute gastritis with hemorrhage 07/19/2023   Gastric and duodenal angiodysplasia with hemorrhage 07/19/2023   ABLA (acute blood loss anemia) 07/17/2023   GIB (gastrointestinal bleeding) 07/17/2023   NSTEMI (non-ST elevated myocardial infarction) (HCC) 10/05/2022   Ileus (HCC) 09/30/2022   Urinary retention 09/30/2022   Paralytic ileus (HCC) 09/30/2022   ESRD on hemodialysis (HCC) 09/30/2022   Chronic HFrEF (heart failure with reduced ejection fraction) (HCC) 09/30/2022   Hypotension 09/29/2022   Paroxysmal atrial fibrillation (HCC) 09/29/2022   DVT (deep venous thrombosis) (HCC) 09/24/2022   Hospital-acquired bacterial pneumonia 09/24/2022   Acute hypoxic respiratory failure (HCC) 09/23/2022   Secondary hyperparathyroidism  of renal origin 02/09/2021   Fluid overload, unspecified 11/06/2020   COVID-19 08/10/2020   Iron  deficiency anemia, unspecified 01/10/2020   Anticoagulated 12/20/2019   Encounter for immunization 12/17/2019   Anaphylactic shock, unspecified, initial encounter 12/16/2019   Cardiomyopathy, unspecified (HCC) 12/16/2019   Heart failure, unspecified (HCC) 12/16/2019   Hyperparathyroidism, unspecified 12/16/2019   Metabolic disorder, unspecified 12/16/2019   Allergy, unspecified, initial encounter 12/16/2019   Other specified coagulation defects 12/16/2019   Pain, unspecified 12/16/2019   Acute systolic CHF (congestive heart failure) (HCC)    S/P dialysis catheter insertion    End stage renal disease on dialysis (HCC) 12/12/2019   PAD (peripheral artery disease) 12/12/2019   Pressure injury 12/01/2019   Anemia 11/29/2019   Uncontrolled hypertension 11/29/2019   CKD (chronic kidney disease), stage V (HCC) 11/29/2019   Severe protein-calorie malnutrition    Diabetic polyneuropathy associated with type 2 diabetes mellitus (HCC)    Venous insufficiency of right lower extremity    Pneumonia    Renal failure 11/28/2019   Toe gangrene (HCC) 12/18/2013   Gangrene of toe (HCC) 09/16/2013   Fever with chills 09/16/2013   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/2013   DKA (diabetic ketoacidosis) (HCC) 09/04/2013   Type 2 diabetes mellitus, controlled (HCC) 09/02/2013   DM (diabetes mellitus) type II controlled, neurological manifestation (HCC) 09/01/2013      SUMMARY [name] was 69 y.o. patient with    has a past medical history of CHF (congestive heart failure) (HCC), Diabetes mellitus without complication (HCC), ESRD on hemodialysis (HCC), Hepatitis, Hyperlipemia, Hypertension, Necrotizing fasciitis (HCC), and Pneumonia.  has a past surgical history that includes Cholecystectomy; I & D extremity (Left, 09/03/2013); I & D extremity (Left, 09/11/2013); Amputation (Left, 09/11/2013);  Amputation (Right, 12/18/2013); AV fistula placement (Left, 12/11/2019); Insertion of dialysis catheter (12/11/2019); LEFT HEART CATH AND CORONARY ANGIOGRAPHY (N/A, 12/13/2019); RIGHT HEART CATH (N/A, 12/13/2019); Revison of arteriovenous fistula (Left, 07/11/2022); Insertion of dialysis catheter (Right, 07/11/2022); Esophagogastroduodenoscopy (egd) with propofol  (N/A, 07/19/2023); biopsy (07/19/2023); Hot hemostasis (N/A, 07/19/2023); A/V Fistulagram (N/A, 08/16/2023); PERIPHERAL VASCULAR BALLOON ANGIOPLASTY (Left, 08/16/2023); Revison of arteriovenous fistula (Left, 04/23/2024); Artery repair (Left, 04/23/2024); and Esophagogastroduodenoscopy (N/A, 06/15/2024).   Admitted on 06/29/2024 with    69 year old male with past medical history of CHF, DM, ESRD on HD TTS, paroxysmal AF on Eliquis , hyperlipidemia, hypertension who [facility] from [facility] as cardiac arrest.    History obtained from EDP and chart review as patient intubated, no family present. Patient reportedly seen normal 30 minutes prior to being found unresponsive. Noted in PEA arrest at 5:15 PM with EMS and ACLS was initiated. ROSC at 5:24PM. Received 1 epi, D10. On arrival to ED, hypotensive, obtunded and was intubated. Labs notable for WBC 21.1, Hgb 11.2, plt 271, bicarb 16, BUN 24, sCr 3.16, AST 1219, ALT 528, alk phos 144, t bili 2.3, AG 33. Troponin 152. Initial lactic acid 5.6>4.2. CT head negative. 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 Was given 500cc IVF, vanc/zosyn  and started on levophed . CCM consulted for admission.   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 Admission 11/13-11/21/2025 for GIB EGD with no acute finding, gangrene of penis on cefepime  vanc  COURSE   07-23-24 - sguar 500 +. On vent 50%. On vasopression. On levophed . On Bic Gtt. S/p TXA and Vit K.  On ABx. REfractory shock +. Now DNR, DNI  and continue acute care but without escalation  (no 3rd presso, no a line, no CRRTT) per iPAL . AFebrile/. MRSA PCR neg.  Cutlure results pending.  Hypohermic with bair hugger +  PEr Tammy - eSRD x 6 years, FTT with 20# weigh loss x 1 year. 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 Left BKA chronic. Has sacral and back decub/abrasioni prior to admit. REfuses intervention for health. Tammy also reports LUE swellnig that is chronic distal to area of fistula.   MAP 46 or 30 flutuates Hgb 5 -6 this mroning - overnight bleeding from Rt  internal jugular site and now stopped   Madelin Stack friend now decision maker - estranged siblings gave the permission for her to be decision maker.   Problems identified Out of Hospital Cardiac Arest Refractory shock - septic and cardiogenic Aspiration pneunonia Acute respiratory failure Hypothermia Acute on chronic anemia Metabolic acidosis Protein calorie malnutirtion   Goals of care conversation held. Patient made comfortable and he passed away 23-Jul-2024         SIGNED Dr. Dorethia Cave, M.D., [facility].C.P Pulmonary and Critical Care Medicine Staff Physician [facility] Pager: [phone], If no answer or between  15:00h - 7:00h: call [phone]  07/26/2024 3:39 PM     "
